# Patient Record
Sex: Female | Born: 1960
Health system: Southern US, Community
[De-identification: ages and names within clinical notes are randomized; demographics above are authoritative.]

## PROBLEM LIST (undated history)

## (undated) DIAGNOSIS — M7500 Adhesive capsulitis of unspecified shoulder: Secondary | ICD-10-CM

## (undated) DIAGNOSIS — J189 Pneumonia, unspecified organism: Secondary | ICD-10-CM

## (undated) DIAGNOSIS — I10 Essential (primary) hypertension: Secondary | ICD-10-CM

## (undated) DIAGNOSIS — E785 Hyperlipidemia, unspecified: Secondary | ICD-10-CM

## (undated) DIAGNOSIS — G629 Polyneuropathy, unspecified: Secondary | ICD-10-CM

## (undated) DIAGNOSIS — E669 Obesity, unspecified: Secondary | ICD-10-CM

## (undated) DIAGNOSIS — I2510 Atherosclerotic heart disease of native coronary artery without angina pectoris: Secondary | ICD-10-CM

## (undated) DIAGNOSIS — K219 Gastro-esophageal reflux disease without esophagitis: Secondary | ICD-10-CM

## (undated) DIAGNOSIS — R002 Palpitations: Secondary | ICD-10-CM

## (undated) DIAGNOSIS — I509 Heart failure, unspecified: Secondary | ICD-10-CM

## (undated) HISTORY — DX: Adhesive capsulitis of unspecified shoulder: M75.00

## (undated) HISTORY — PX: GLAUCOMA SURGERY: SHX656

## (undated) HISTORY — PX: CARDIAC CATHETERIZATION: SHX172

## (undated) HISTORY — DX: Essential (primary) hypertension: I10

## (undated) HISTORY — DX: Obesity, unspecified: E66.9

## (undated) HISTORY — DX: Polyneuropathy, unspecified: G62.9

## (undated) HISTORY — PX: TUBAL LIGATION: SHX77

## (undated) HISTORY — DX: Palpitations: R00.2

## (undated) HISTORY — PX: CATARACT EXTRACTION: SUR2

## (undated) HISTORY — DX: Gastro-esophageal reflux disease without esophagitis: K21.9

## (undated) HISTORY — PX: APPENDECTOMY: SHX54

## (undated) HISTORY — DX: Pneumonia, unspecified organism: J18.9

## (undated) HISTORY — DX: Hyperlipidemia, unspecified: E78.5

---

## 1999-01-20 ENCOUNTER — Encounter: Admission: RE | Admit: 1999-01-20 | Discharge: 1999-01-20 | Payer: Self-pay | Admitting: Internal Medicine

## 1999-08-27 ENCOUNTER — Encounter: Admission: RE | Admit: 1999-08-27 | Discharge: 1999-08-27 | Payer: Self-pay | Admitting: Hematology and Oncology

## 2000-05-03 ENCOUNTER — Encounter: Admission: RE | Admit: 2000-05-03 | Discharge: 2000-05-03 | Payer: Self-pay | Admitting: Internal Medicine

## 2000-12-26 ENCOUNTER — Encounter: Payer: Self-pay | Admitting: Emergency Medicine

## 2000-12-26 ENCOUNTER — Emergency Department (HOSPITAL_COMMUNITY): Admission: EM | Admit: 2000-12-26 | Discharge: 2000-12-26 | Payer: Self-pay

## 2001-01-16 ENCOUNTER — Encounter: Admission: RE | Admit: 2001-01-16 | Discharge: 2001-01-16 | Payer: Self-pay | Admitting: Internal Medicine

## 2002-02-10 ENCOUNTER — Inpatient Hospital Stay (HOSPITAL_COMMUNITY): Admission: EM | Admit: 2002-02-10 | Discharge: 2002-02-12 | Payer: Self-pay | Admitting: *Deleted

## 2002-02-10 ENCOUNTER — Encounter: Payer: Self-pay | Admitting: *Deleted

## 2002-02-11 ENCOUNTER — Encounter: Payer: Self-pay | Admitting: Internal Medicine

## 2002-02-12 ENCOUNTER — Encounter (INDEPENDENT_AMBULATORY_CARE_PROVIDER_SITE_OTHER): Payer: Self-pay | Admitting: Cardiology

## 2002-02-12 ENCOUNTER — Encounter: Payer: Self-pay | Admitting: Internal Medicine

## 2002-06-21 ENCOUNTER — Encounter: Admission: RE | Admit: 2002-06-21 | Discharge: 2002-06-21 | Payer: Self-pay | Admitting: Internal Medicine

## 2002-07-15 ENCOUNTER — Encounter: Admission: RE | Admit: 2002-07-15 | Discharge: 2002-07-15 | Payer: Self-pay | Admitting: Internal Medicine

## 2002-07-15 ENCOUNTER — Emergency Department (HOSPITAL_COMMUNITY): Admission: EM | Admit: 2002-07-15 | Discharge: 2002-07-15 | Payer: Self-pay | Admitting: Emergency Medicine

## 2002-12-09 ENCOUNTER — Encounter: Admission: RE | Admit: 2002-12-09 | Discharge: 2002-12-09 | Payer: Self-pay | Admitting: Internal Medicine

## 2002-12-11 ENCOUNTER — Encounter: Admission: RE | Admit: 2002-12-11 | Discharge: 2002-12-11 | Payer: Self-pay | Admitting: Internal Medicine

## 2002-12-11 ENCOUNTER — Ambulatory Visit (HOSPITAL_COMMUNITY): Admission: RE | Admit: 2002-12-11 | Discharge: 2002-12-11 | Payer: Self-pay | Admitting: Internal Medicine

## 2002-12-11 ENCOUNTER — Encounter: Payer: Self-pay | Admitting: Internal Medicine

## 2002-12-13 ENCOUNTER — Encounter: Admission: RE | Admit: 2002-12-13 | Discharge: 2002-12-13 | Payer: Self-pay | Admitting: Internal Medicine

## 2003-03-21 ENCOUNTER — Encounter: Admission: RE | Admit: 2003-03-21 | Discharge: 2003-03-21 | Payer: Self-pay | Admitting: Internal Medicine

## 2003-05-07 ENCOUNTER — Encounter: Admission: RE | Admit: 2003-05-07 | Discharge: 2003-05-07 | Payer: Self-pay | Admitting: Internal Medicine

## 2003-08-24 ENCOUNTER — Emergency Department (HOSPITAL_COMMUNITY): Admission: AD | Admit: 2003-08-24 | Discharge: 2003-08-24 | Payer: Self-pay | Admitting: Family Medicine

## 2003-11-05 ENCOUNTER — Encounter: Admission: RE | Admit: 2003-11-05 | Discharge: 2003-11-05 | Payer: Self-pay | Admitting: Internal Medicine

## 2003-11-12 ENCOUNTER — Emergency Department (HOSPITAL_COMMUNITY): Admission: EM | Admit: 2003-11-12 | Discharge: 2003-11-13 | Payer: Self-pay | Admitting: Emergency Medicine

## 2004-07-21 ENCOUNTER — Emergency Department (HOSPITAL_COMMUNITY): Admission: EM | Admit: 2004-07-21 | Discharge: 2004-07-21 | Payer: Self-pay | Admitting: Family Medicine

## 2004-09-29 ENCOUNTER — Emergency Department (HOSPITAL_COMMUNITY): Admission: EM | Admit: 2004-09-29 | Discharge: 2004-09-29 | Payer: Self-pay | Admitting: Family Medicine

## 2004-11-18 ENCOUNTER — Emergency Department (HOSPITAL_COMMUNITY): Admission: EM | Admit: 2004-11-18 | Discharge: 2004-11-19 | Payer: Self-pay | Admitting: Emergency Medicine

## 2005-01-26 ENCOUNTER — Emergency Department (HOSPITAL_COMMUNITY): Admission: EM | Admit: 2005-01-26 | Discharge: 2005-01-26 | Payer: Self-pay | Admitting: Family Medicine

## 2005-02-10 ENCOUNTER — Ambulatory Visit: Payer: Self-pay | Admitting: Internal Medicine

## 2005-04-14 ENCOUNTER — Emergency Department (HOSPITAL_COMMUNITY): Admission: EM | Admit: 2005-04-14 | Discharge: 2005-04-14 | Payer: Self-pay | Admitting: Emergency Medicine

## 2005-07-19 ENCOUNTER — Emergency Department (HOSPITAL_COMMUNITY): Admission: EM | Admit: 2005-07-19 | Discharge: 2005-07-19 | Payer: Self-pay | Admitting: Family Medicine

## 2005-10-20 ENCOUNTER — Ambulatory Visit: Payer: Self-pay | Admitting: Internal Medicine

## 2006-01-12 ENCOUNTER — Ambulatory Visit: Payer: Self-pay | Admitting: Internal Medicine

## 2006-01-17 ENCOUNTER — Ambulatory Visit: Payer: Self-pay | Admitting: Internal Medicine

## 2006-01-17 ENCOUNTER — Inpatient Hospital Stay (HOSPITAL_COMMUNITY): Admission: EM | Admit: 2006-01-17 | Discharge: 2006-01-19 | Payer: Self-pay | Admitting: Family Medicine

## 2006-01-18 ENCOUNTER — Encounter (INDEPENDENT_AMBULATORY_CARE_PROVIDER_SITE_OTHER): Payer: Self-pay | Admitting: Internal Medicine

## 2006-01-18 ENCOUNTER — Ambulatory Visit: Payer: Self-pay | Admitting: Dentistry

## 2006-03-07 ENCOUNTER — Ambulatory Visit: Payer: Self-pay | Admitting: Internal Medicine

## 2006-03-17 ENCOUNTER — Ambulatory Visit: Payer: Self-pay | Admitting: Hospitalist

## 2006-04-12 ENCOUNTER — Ambulatory Visit: Payer: Self-pay | Admitting: Internal Medicine

## 2006-04-26 ENCOUNTER — Emergency Department (HOSPITAL_COMMUNITY): Admission: EM | Admit: 2006-04-26 | Discharge: 2006-04-26 | Payer: Self-pay | Admitting: Family Medicine

## 2006-05-08 ENCOUNTER — Ambulatory Visit (HOSPITAL_COMMUNITY): Admission: RE | Admit: 2006-05-08 | Discharge: 2006-05-08 | Payer: Self-pay | Admitting: Ophthalmology

## 2006-06-12 DIAGNOSIS — E1165 Type 2 diabetes mellitus with hyperglycemia: Secondary | ICD-10-CM | POA: Insufficient documentation

## 2006-06-12 DIAGNOSIS — E1140 Type 2 diabetes mellitus with diabetic neuropathy, unspecified: Secondary | ICD-10-CM | POA: Insufficient documentation

## 2006-06-12 DIAGNOSIS — I10 Essential (primary) hypertension: Secondary | ICD-10-CM | POA: Insufficient documentation

## 2006-06-12 DIAGNOSIS — G609 Hereditary and idiopathic neuropathy, unspecified: Secondary | ICD-10-CM | POA: Insufficient documentation

## 2006-06-12 DIAGNOSIS — E66812 Obesity, class 2: Secondary | ICD-10-CM | POA: Insufficient documentation

## 2006-06-12 DIAGNOSIS — E669 Obesity, unspecified: Secondary | ICD-10-CM | POA: Insufficient documentation

## 2006-06-12 DIAGNOSIS — E785 Hyperlipidemia, unspecified: Secondary | ICD-10-CM | POA: Insufficient documentation

## 2006-07-17 ENCOUNTER — Ambulatory Visit (HOSPITAL_COMMUNITY): Admission: RE | Admit: 2006-07-17 | Discharge: 2006-07-17 | Payer: Self-pay | Admitting: Ophthalmology

## 2006-08-03 ENCOUNTER — Ambulatory Visit (HOSPITAL_COMMUNITY): Admission: RE | Admit: 2006-08-03 | Discharge: 2006-08-03 | Payer: Self-pay | Admitting: Internal Medicine

## 2006-08-03 ENCOUNTER — Ambulatory Visit: Payer: Self-pay | Admitting: Vascular Surgery

## 2006-08-03 ENCOUNTER — Ambulatory Visit: Payer: Self-pay | Admitting: Internal Medicine

## 2006-08-16 ENCOUNTER — Ambulatory Visit: Payer: Self-pay | Admitting: Internal Medicine

## 2006-08-16 ENCOUNTER — Encounter (INDEPENDENT_AMBULATORY_CARE_PROVIDER_SITE_OTHER): Payer: Self-pay | Admitting: Unknown Physician Specialty

## 2006-08-16 LAB — CONVERTED CEMR LAB
Glucose, Bld: 242 mg/dL
Hgb A1c MFr Bld: 10.6 %

## 2006-08-29 ENCOUNTER — Telehealth (INDEPENDENT_AMBULATORY_CARE_PROVIDER_SITE_OTHER): Payer: Self-pay | Admitting: *Deleted

## 2006-09-04 ENCOUNTER — Ambulatory Visit: Payer: Self-pay | Admitting: Internal Medicine

## 2006-09-05 ENCOUNTER — Encounter (INDEPENDENT_AMBULATORY_CARE_PROVIDER_SITE_OTHER): Payer: Self-pay | Admitting: Internal Medicine

## 2006-09-11 ENCOUNTER — Telehealth: Payer: Self-pay | Admitting: *Deleted

## 2006-09-18 ENCOUNTER — Inpatient Hospital Stay (HOSPITAL_COMMUNITY): Admission: RE | Admit: 2006-09-18 | Discharge: 2006-09-21 | Payer: Self-pay | Admitting: Internal Medicine

## 2006-09-18 ENCOUNTER — Ambulatory Visit: Payer: Self-pay | Admitting: Internal Medicine

## 2006-09-18 ENCOUNTER — Encounter (INDEPENDENT_AMBULATORY_CARE_PROVIDER_SITE_OTHER): Payer: Self-pay | Admitting: *Deleted

## 2006-09-18 ENCOUNTER — Encounter (INDEPENDENT_AMBULATORY_CARE_PROVIDER_SITE_OTHER): Payer: Self-pay | Admitting: Internal Medicine

## 2006-09-18 LAB — CONVERTED CEMR LAB: Blood Glucose, Fingerstick: 232

## 2006-09-28 ENCOUNTER — Encounter (INDEPENDENT_AMBULATORY_CARE_PROVIDER_SITE_OTHER): Payer: Self-pay | Admitting: Internal Medicine

## 2006-10-06 ENCOUNTER — Ambulatory Visit: Payer: Self-pay | Admitting: Internal Medicine

## 2006-10-06 ENCOUNTER — Encounter (INDEPENDENT_AMBULATORY_CARE_PROVIDER_SITE_OTHER): Payer: Self-pay | Admitting: *Deleted

## 2006-10-06 LAB — CONVERTED CEMR LAB
Alkaline Phosphatase: 50 units/L (ref 39–117)
BUN: 19 mg/dL (ref 6–23)
Blood Glucose, Fingerstick: 280
Creatinine, Ser: 0.74 mg/dL (ref 0.40–1.20)
Glucose, Bld: 243 mg/dL — ABNORMAL HIGH (ref 70–99)
Microalb, Ur: 0.77 mg/dL (ref 0.00–1.89)
TSH: 3.109 microintl units/mL (ref 0.350–5.50)
Total Bilirubin: 0.5 mg/dL (ref 0.3–1.2)

## 2006-10-11 ENCOUNTER — Ambulatory Visit: Payer: Self-pay | Admitting: Hospitalist

## 2006-10-11 LAB — CONVERTED CEMR LAB: Blood Glucose, Fingerstick: 301

## 2006-10-13 ENCOUNTER — Ambulatory Visit: Payer: Self-pay | Admitting: Internal Medicine

## 2006-10-23 ENCOUNTER — Ambulatory Visit (HOSPITAL_COMMUNITY): Admission: RE | Admit: 2006-10-23 | Discharge: 2006-10-23 | Payer: Self-pay | Admitting: Ophthalmology

## 2006-10-23 ENCOUNTER — Encounter (INDEPENDENT_AMBULATORY_CARE_PROVIDER_SITE_OTHER): Payer: Self-pay | Admitting: Internal Medicine

## 2006-10-26 ENCOUNTER — Telehealth (INDEPENDENT_AMBULATORY_CARE_PROVIDER_SITE_OTHER): Payer: Self-pay | Admitting: *Deleted

## 2006-11-01 ENCOUNTER — Telehealth (INDEPENDENT_AMBULATORY_CARE_PROVIDER_SITE_OTHER): Payer: Self-pay | Admitting: Internal Medicine

## 2007-01-11 ENCOUNTER — Telehealth: Payer: Self-pay | Admitting: *Deleted

## 2007-02-05 ENCOUNTER — Ambulatory Visit (HOSPITAL_COMMUNITY): Admission: RE | Admit: 2007-02-05 | Discharge: 2007-02-05 | Payer: Self-pay | Admitting: Ophthalmology

## 2007-02-16 ENCOUNTER — Ambulatory Visit: Payer: Self-pay | Admitting: Infectious Disease

## 2007-02-16 ENCOUNTER — Telehealth: Payer: Self-pay | Admitting: *Deleted

## 2007-02-16 ENCOUNTER — Encounter: Payer: Self-pay | Admitting: Internal Medicine

## 2007-02-16 LAB — CONVERTED CEMR LAB
ALT: 12 units/L (ref 0–35)
Alkaline Phosphatase: 64 units/L (ref 39–117)
Blood Glucose, Fingerstick: 111
Creatinine, Ser: 0.82 mg/dL (ref 0.40–1.20)
Eosinophils Absolute: 0 10*3/uL (ref 0.0–0.7)
HCT: 33.5 % — ABNORMAL LOW (ref 36.0–46.0)
Lymphocytes Relative: 27 % (ref 12–46)
Lymphs Abs: 3.7 10*3/uL — ABNORMAL HIGH (ref 0.7–3.3)
MCV: 86.4 fL (ref 78.0–100.0)
Monocytes Relative: 6 % (ref 3–11)
Neutrophils Relative %: 67 % (ref 43–77)
RBC Folate: 658 ng/mL — ABNORMAL HIGH (ref 180–600)
RBC: 3.88 M/uL (ref 3.87–5.11)
Sodium: 139 meq/L (ref 135–145)
TSH: 0.802 microintl units/mL (ref 0.350–5.50)
Total Bilirubin: 0.3 mg/dL (ref 0.3–1.2)
Total Protein: 7.9 g/dL (ref 6.0–8.3)
WBC: 13.7 10*3/uL — ABNORMAL HIGH (ref 4.0–10.5)

## 2007-02-19 ENCOUNTER — Telehealth (INDEPENDENT_AMBULATORY_CARE_PROVIDER_SITE_OTHER): Payer: Self-pay | Admitting: Internal Medicine

## 2007-02-19 ENCOUNTER — Ambulatory Visit: Payer: Self-pay | Admitting: Internal Medicine

## 2007-02-19 LAB — CONVERTED CEMR LAB

## 2007-02-20 ENCOUNTER — Telehealth (INDEPENDENT_AMBULATORY_CARE_PROVIDER_SITE_OTHER): Payer: Self-pay | Admitting: *Deleted

## 2007-02-20 ENCOUNTER — Encounter (INDEPENDENT_AMBULATORY_CARE_PROVIDER_SITE_OTHER): Payer: Self-pay | Admitting: Internal Medicine

## 2007-03-05 ENCOUNTER — Ambulatory Visit: Payer: Self-pay | Admitting: Internal Medicine

## 2007-03-05 ENCOUNTER — Encounter (INDEPENDENT_AMBULATORY_CARE_PROVIDER_SITE_OTHER): Payer: Self-pay | Admitting: *Deleted

## 2007-03-05 LAB — CONVERTED CEMR LAB
Blood Glucose, Fingerstick: 240
Blood Glucose, Fingerstick: 240

## 2007-03-20 ENCOUNTER — Telehealth: Payer: Self-pay | Admitting: Licensed Clinical Social Worker

## 2007-03-22 ENCOUNTER — Encounter: Payer: Self-pay | Admitting: Licensed Clinical Social Worker

## 2007-04-10 ENCOUNTER — Emergency Department (HOSPITAL_COMMUNITY): Admission: EM | Admit: 2007-04-10 | Discharge: 2007-04-10 | Payer: Self-pay | Admitting: Emergency Medicine

## 2007-04-12 ENCOUNTER — Encounter (INDEPENDENT_AMBULATORY_CARE_PROVIDER_SITE_OTHER): Payer: Self-pay | Admitting: Internal Medicine

## 2007-04-23 ENCOUNTER — Encounter (INDEPENDENT_AMBULATORY_CARE_PROVIDER_SITE_OTHER): Payer: Self-pay | Admitting: Internal Medicine

## 2007-04-24 ENCOUNTER — Encounter (INDEPENDENT_AMBULATORY_CARE_PROVIDER_SITE_OTHER): Payer: Self-pay | Admitting: Internal Medicine

## 2007-05-10 ENCOUNTER — Telehealth (INDEPENDENT_AMBULATORY_CARE_PROVIDER_SITE_OTHER): Payer: Self-pay | Admitting: *Deleted

## 2007-05-23 ENCOUNTER — Encounter (INDEPENDENT_AMBULATORY_CARE_PROVIDER_SITE_OTHER): Payer: Self-pay | Admitting: Internal Medicine

## 2007-09-07 ENCOUNTER — Encounter (INDEPENDENT_AMBULATORY_CARE_PROVIDER_SITE_OTHER): Payer: Self-pay | Admitting: Internal Medicine

## 2007-10-29 ENCOUNTER — Ambulatory Visit (HOSPITAL_COMMUNITY): Admission: RE | Admit: 2007-10-29 | Discharge: 2007-10-29 | Payer: Self-pay | Admitting: Ophthalmology

## 2007-10-30 ENCOUNTER — Encounter (INDEPENDENT_AMBULATORY_CARE_PROVIDER_SITE_OTHER): Payer: Self-pay | Admitting: Internal Medicine

## 2007-11-24 ENCOUNTER — Emergency Department (HOSPITAL_COMMUNITY): Admission: EM | Admit: 2007-11-24 | Discharge: 2007-11-24 | Payer: Self-pay | Admitting: Emergency Medicine

## 2007-12-14 ENCOUNTER — Encounter (INDEPENDENT_AMBULATORY_CARE_PROVIDER_SITE_OTHER): Payer: Self-pay | Admitting: Internal Medicine

## 2007-12-14 ENCOUNTER — Ambulatory Visit: Payer: Self-pay | Admitting: *Deleted

## 2007-12-14 ENCOUNTER — Encounter: Payer: Self-pay | Admitting: Licensed Clinical Social Worker

## 2007-12-14 LAB — CONVERTED CEMR LAB
Blood Glucose, Fingerstick: 224
Hgb A1c MFr Bld: 8 %

## 2007-12-17 LAB — CONVERTED CEMR LAB
AST: 12 units/L (ref 0–37)
Albumin: 4.1 g/dL (ref 3.5–5.2)
Alkaline Phosphatase: 51 units/L (ref 39–117)
BUN: 26 mg/dL — ABNORMAL HIGH (ref 6–23)
Calcium: 9.4 mg/dL (ref 8.4–10.5)
Creatinine, Ser: 1.01 mg/dL (ref 0.40–1.20)
Glucose, Bld: 249 mg/dL — ABNORMAL HIGH (ref 70–99)
HCT: 33.7 % — ABNORMAL LOW (ref 36.0–46.0)
HDL: 52 mg/dL (ref >39)
Hemoglobin: 10.8 g/dL — ABNORMAL LOW (ref 12.0–15.0)
LDL Cholesterol: 117 mg/dL — ABNORMAL HIGH (ref 0–99)
MCHC: 32 g/dL (ref 30.0–36.0)
MCV: 87.3 fL (ref 78.0–100.0)
Potassium: 4.7 meq/L (ref 3.5–5.3)
RBC: 3.86 M/uL — ABNORMAL LOW (ref 3.87–5.11)
RDW: 16.4 % — ABNORMAL HIGH (ref 11.5–15.5)
Total CHOL/HDL Ratio: 4
Triglycerides: 186 mg/dL — ABNORMAL HIGH (ref <150)

## 2007-12-20 ENCOUNTER — Encounter (INDEPENDENT_AMBULATORY_CARE_PROVIDER_SITE_OTHER): Payer: Self-pay | Admitting: Internal Medicine

## 2007-12-20 ENCOUNTER — Ambulatory Visit: Payer: Self-pay | Admitting: Internal Medicine

## 2007-12-20 DIAGNOSIS — D649 Anemia, unspecified: Secondary | ICD-10-CM | POA: Insufficient documentation

## 2007-12-20 DIAGNOSIS — D72829 Elevated white blood cell count, unspecified: Secondary | ICD-10-CM | POA: Insufficient documentation

## 2007-12-20 LAB — CONVERTED CEMR LAB
Hemoglobin: 10.8 g/dL — ABNORMAL LOW (ref 12.0–15.0)
Lymphocytes Relative: 33 % (ref 12–46)
Lymphs Abs: 4.1 10*3/uL — ABNORMAL HIGH (ref 0.7–4.0)
Monocytes Relative: 4 % (ref 3–12)
Neutro Abs: 7.5 10*3/uL (ref 1.7–7.7)
Neutrophils Relative %: 61 % (ref 43–77)
RBC Folate: 514 ng/mL (ref 180–600)
RBC: 3.76 M/uL — ABNORMAL LOW (ref 3.87–5.11)
WBC: 12.3 10*3/uL — ABNORMAL HIGH (ref 4.0–10.5)

## 2007-12-21 LAB — CONVERTED CEMR LAB
Alkaline Phosphatase: 55 units/L (ref 39–117)
CO2: 22 meq/L (ref 19–32)
Creatinine, Ser: 1.03 mg/dL (ref 0.40–1.20)
Glucose, Bld: 251 mg/dL — ABNORMAL HIGH (ref 70–99)
TSH: 1.588 microintl units/mL (ref 0.350–5.50)
Total Bilirubin: 0.2 mg/dL — ABNORMAL LOW (ref 0.3–1.2)
Vitamin B-12: 648 pg/mL (ref 211–911)

## 2008-01-17 ENCOUNTER — Ambulatory Visit: Payer: Self-pay | Admitting: Internal Medicine

## 2008-02-01 ENCOUNTER — Ambulatory Visit: Payer: Self-pay | Admitting: Infectious Diseases

## 2008-02-01 ENCOUNTER — Encounter: Payer: Self-pay | Admitting: Internal Medicine

## 2008-02-01 LAB — CONVERTED CEMR LAB
Blood Glucose, Fingerstick: 335
Eosinophils Absolute: 0.1 10*3/uL (ref 0.0–0.7)
Hgb A1c MFr Bld: 8.2 %
Lymphocytes Relative: 38 % (ref 12–46)
Lymphs Abs: 5.1 10*3/uL — ABNORMAL HIGH (ref 0.7–4.0)
MCV: 88.1 fL (ref 78.0–100.0)
Neutro Abs: 7.5 10*3/uL (ref 1.7–7.7)
Neutrophils Relative %: 55 % (ref 43–77)
Platelets: 286 10*3/uL (ref 150–400)
WBC: 13.5 10*3/uL — ABNORMAL HIGH (ref 4.0–10.5)

## 2008-02-05 ENCOUNTER — Telehealth: Payer: Self-pay | Admitting: *Deleted

## 2008-02-13 ENCOUNTER — Encounter: Payer: Self-pay | Admitting: Internal Medicine

## 2008-02-13 ENCOUNTER — Ambulatory Visit: Payer: Self-pay | Admitting: Internal Medicine

## 2008-02-13 LAB — CONVERTED CEMR LAB
Alkaline Phosphatase: 54 units/L (ref 39–117)
Blood Glucose, Fingerstick: 91
Glucose, Bld: 108 mg/dL — ABNORMAL HIGH (ref 70–99)
Microalb, Ur: 0.61 mg/dL (ref 0.00–1.89)
Sodium: 140 meq/L (ref 135–145)
Total Bilirubin: 0.2 mg/dL — ABNORMAL LOW (ref 0.3–1.2)
Total Protein: 7.1 g/dL (ref 6.0–8.3)

## 2008-02-20 ENCOUNTER — Ambulatory Visit: Payer: Self-pay | Admitting: *Deleted

## 2008-02-20 ENCOUNTER — Telehealth: Payer: Self-pay | Admitting: *Deleted

## 2008-02-20 LAB — CONVERTED CEMR LAB
Bilirubin Urine: NEGATIVE
Specific Gravity, Urine: 1.021 (ref 1.005–1.03)
Urine Glucose: 250 mg/dL — AB

## 2008-02-22 ENCOUNTER — Encounter (INDEPENDENT_AMBULATORY_CARE_PROVIDER_SITE_OTHER): Payer: Self-pay | Admitting: *Deleted

## 2008-02-22 ENCOUNTER — Ambulatory Visit: Payer: Self-pay | Admitting: *Deleted

## 2008-02-22 ENCOUNTER — Encounter: Payer: Self-pay | Admitting: Internal Medicine

## 2008-02-22 DIAGNOSIS — A64 Unspecified sexually transmitted disease: Secondary | ICD-10-CM | POA: Insufficient documentation

## 2008-02-22 LAB — CONVERTED CEMR LAB: Blood Glucose, Fingerstick: 132

## 2008-02-25 ENCOUNTER — Ambulatory Visit: Payer: Self-pay | Admitting: Internal Medicine

## 2008-03-13 ENCOUNTER — Telehealth: Payer: Self-pay | Admitting: *Deleted

## 2008-03-19 ENCOUNTER — Ambulatory Visit: Payer: Self-pay | Admitting: Internal Medicine

## 2008-03-26 ENCOUNTER — Ambulatory Visit (HOSPITAL_COMMUNITY): Admission: RE | Admit: 2008-03-26 | Discharge: 2008-03-26 | Payer: Self-pay | Admitting: Internal Medicine

## 2008-04-18 ENCOUNTER — Telehealth: Payer: Self-pay | Admitting: Internal Medicine

## 2008-04-30 ENCOUNTER — Ambulatory Visit: Payer: Self-pay | Admitting: *Deleted

## 2008-04-30 LAB — CONVERTED CEMR LAB: Blood Glucose, Fingerstick: 121

## 2008-05-07 ENCOUNTER — Ambulatory Visit: Payer: Self-pay | Admitting: Sports Medicine

## 2008-05-07 DIAGNOSIS — M7500 Adhesive capsulitis of unspecified shoulder: Secondary | ICD-10-CM | POA: Insufficient documentation

## 2008-05-08 ENCOUNTER — Encounter: Payer: Self-pay | Admitting: Sports Medicine

## 2008-05-14 ENCOUNTER — Encounter: Payer: Self-pay | Admitting: Internal Medicine

## 2008-06-04 ENCOUNTER — Ambulatory Visit: Payer: Self-pay | Admitting: Internal Medicine

## 2008-06-04 LAB — CONVERTED CEMR LAB: Blood Glucose, Fingerstick: 83

## 2008-06-09 ENCOUNTER — Encounter: Payer: Self-pay | Admitting: Internal Medicine

## 2008-06-09 ENCOUNTER — Ambulatory Visit: Payer: Self-pay | Admitting: Sports Medicine

## 2008-06-09 ENCOUNTER — Telehealth: Payer: Self-pay | Admitting: Internal Medicine

## 2008-07-02 ENCOUNTER — Telehealth (INDEPENDENT_AMBULATORY_CARE_PROVIDER_SITE_OTHER): Payer: Self-pay | Admitting: *Deleted

## 2008-08-05 ENCOUNTER — Ambulatory Visit: Payer: Self-pay | Admitting: Sports Medicine

## 2008-08-06 ENCOUNTER — Encounter: Admission: RE | Admit: 2008-08-06 | Discharge: 2008-08-29 | Payer: Self-pay | Admitting: Sports Medicine

## 2008-08-13 ENCOUNTER — Encounter: Payer: Self-pay | Admitting: Sports Medicine

## 2008-08-18 ENCOUNTER — Emergency Department (HOSPITAL_COMMUNITY): Admission: EM | Admit: 2008-08-18 | Discharge: 2008-08-18 | Payer: Self-pay | Admitting: Emergency Medicine

## 2008-08-25 ENCOUNTER — Telehealth (INDEPENDENT_AMBULATORY_CARE_PROVIDER_SITE_OTHER): Payer: Self-pay | Admitting: *Deleted

## 2008-09-29 ENCOUNTER — Ambulatory Visit: Payer: Self-pay | Admitting: Internal Medicine

## 2008-09-29 ENCOUNTER — Encounter: Payer: Self-pay | Admitting: Internal Medicine

## 2008-09-30 ENCOUNTER — Encounter: Payer: Self-pay | Admitting: Internal Medicine

## 2008-09-30 LAB — CONVERTED CEMR LAB
Albumin: 4.2 g/dL (ref 3.5–5.2)
Alkaline Phosphatase: 52 units/L (ref 39–117)
BUN: 21 mg/dL (ref 6–23)
Cholesterol: 152 mg/dL (ref 0–200)
GFR calc Af Amer: >60 mL/min (ref >60)
GFR calc non Af Amer: 60 mL/min — ABNORMAL LOW (ref >60)
Glucose, Bld: 165 mg/dL — ABNORMAL HIGH (ref 70–99)
Microalb Creat Ratio: 12.3 mg/g (ref 0.0–30.0)
Microalb, Ur: 0.9 mg/dL (ref 0.00–1.89)
Total Bilirubin: 0.2 mg/dL — ABNORMAL LOW (ref 0.3–1.2)
VLDL: 19 mg/dL (ref 0–40)

## 2008-10-07 ENCOUNTER — Encounter: Payer: Self-pay | Admitting: Internal Medicine

## 2008-10-17 ENCOUNTER — Telehealth: Payer: Self-pay | Admitting: Internal Medicine

## 2008-10-20 ENCOUNTER — Telehealth: Payer: Self-pay | Admitting: Internal Medicine

## 2008-10-21 ENCOUNTER — Encounter: Payer: Self-pay | Admitting: Internal Medicine

## 2008-10-21 ENCOUNTER — Telehealth (INDEPENDENT_AMBULATORY_CARE_PROVIDER_SITE_OTHER): Payer: Self-pay | Admitting: *Deleted

## 2008-10-23 ENCOUNTER — Telehealth (INDEPENDENT_AMBULATORY_CARE_PROVIDER_SITE_OTHER): Payer: Self-pay | Admitting: *Deleted

## 2008-10-24 ENCOUNTER — Telehealth: Payer: Self-pay | Admitting: *Deleted

## 2008-12-17 ENCOUNTER — Emergency Department (HOSPITAL_COMMUNITY): Admission: EM | Admit: 2008-12-17 | Discharge: 2008-12-17 | Payer: Self-pay | Admitting: Emergency Medicine

## 2008-12-24 ENCOUNTER — Telehealth: Payer: Self-pay | Admitting: Infectious Diseases

## 2009-01-15 ENCOUNTER — Ambulatory Visit: Payer: Self-pay | Admitting: Infectious Diseases

## 2009-01-15 LAB — CONVERTED CEMR LAB
ALT: 11 units/L (ref 0–35)
AST: 15 units/L (ref 0–37)
Basophils Absolute: 0 10*3/uL (ref 0.0–0.1)
Basophils Relative: 0 % (ref 0–1)
Bilirubin Urine: NEGATIVE
Blood Glucose, Fingerstick: 204
CO2: 24 meq/L (ref 19–32)
Eosinophils Absolute: 0.1 10*3/uL (ref 0.0–0.7)
Eosinophils Relative: 1 % (ref 0–5)
Hemoglobin, Urine: NEGATIVE
Hgb A1c MFr Bld: 8.2 %
Lymphs Abs: 4.1 10*3/uL — ABNORMAL HIGH (ref 0.7–4.0)
MCHC: 32.6 g/dL (ref 30.0–36.0)
MCV: 86.5 fL (ref 78.0–100.0)
Neutrophils Relative %: 59 % (ref 43–77)
Platelets: 282 10*3/uL (ref 150–400)
Protein, ur: NEGATIVE mg/dL
RBC / HPF: NONE SEEN (ref <3)
RDW: 13.9 % (ref 11.5–15.5)
Sodium: 138 meq/L (ref 135–145)
Total Bilirubin: 0.2 mg/dL — ABNORMAL LOW (ref 0.3–1.2)
Total Protein: 7.1 g/dL (ref 6.0–8.3)
Urine Glucose: 100 mg/dL — AB
WBC: 12.1 10*3/uL — ABNORMAL HIGH (ref 4.0–10.5)
pH: 5 (ref 5.0–8.0)

## 2009-01-19 ENCOUNTER — Telehealth: Payer: Self-pay | Admitting: Internal Medicine

## 2009-01-23 ENCOUNTER — Ambulatory Visit: Payer: Self-pay | Admitting: Internal Medicine

## 2009-01-26 ENCOUNTER — Telehealth: Payer: Self-pay | Admitting: Internal Medicine

## 2009-02-11 ENCOUNTER — Telehealth: Payer: Self-pay | Admitting: Internal Medicine

## 2009-02-18 ENCOUNTER — Encounter: Payer: Self-pay | Admitting: Internal Medicine

## 2009-02-18 ENCOUNTER — Ambulatory Visit: Payer: Self-pay | Admitting: Sports Medicine

## 2009-02-23 ENCOUNTER — Telehealth: Payer: Self-pay | Admitting: *Deleted

## 2009-03-03 ENCOUNTER — Encounter: Admission: RE | Admit: 2009-03-03 | Discharge: 2009-06-01 | Payer: Self-pay | Admitting: Sports Medicine

## 2009-03-03 ENCOUNTER — Encounter: Payer: Self-pay | Admitting: Sports Medicine

## 2009-03-03 ENCOUNTER — Encounter: Payer: Self-pay | Admitting: Internal Medicine

## 2009-03-03 ENCOUNTER — Telehealth: Payer: Self-pay | Admitting: Internal Medicine

## 2009-03-12 ENCOUNTER — Ambulatory Visit: Payer: Self-pay | Admitting: Gastroenterology

## 2009-03-17 ENCOUNTER — Encounter: Payer: Self-pay | Admitting: Internal Medicine

## 2009-03-18 ENCOUNTER — Ambulatory Visit: Payer: Self-pay | Admitting: Gastroenterology

## 2009-03-18 ENCOUNTER — Telehealth: Payer: Self-pay | Admitting: Internal Medicine

## 2009-04-16 ENCOUNTER — Telehealth: Payer: Self-pay | Admitting: Internal Medicine

## 2009-04-20 ENCOUNTER — Telehealth: Payer: Self-pay | Admitting: Internal Medicine

## 2009-05-26 ENCOUNTER — Ambulatory Visit: Payer: Self-pay | Admitting: Sports Medicine

## 2009-06-15 ENCOUNTER — Telehealth: Payer: Self-pay | Admitting: Internal Medicine

## 2009-07-01 ENCOUNTER — Encounter: Payer: Self-pay | Admitting: Internal Medicine

## 2009-07-01 ENCOUNTER — Ambulatory Visit: Payer: Self-pay | Admitting: Internal Medicine

## 2009-07-01 LAB — CONVERTED CEMR LAB: Hgb A1c MFr Bld: 8 %

## 2009-07-03 ENCOUNTER — Encounter: Payer: Self-pay | Admitting: Internal Medicine

## 2009-07-13 ENCOUNTER — Telehealth: Payer: Self-pay | Admitting: Internal Medicine

## 2009-07-16 ENCOUNTER — Telehealth: Payer: Self-pay | Admitting: Internal Medicine

## 2009-08-24 ENCOUNTER — Telehealth: Payer: Self-pay | Admitting: Internal Medicine

## 2009-08-30 ENCOUNTER — Emergency Department (HOSPITAL_COMMUNITY)
Admission: EM | Admit: 2009-08-30 | Discharge: 2009-08-30 | Payer: Self-pay | Source: Home / Self Care | Admitting: Emergency Medicine

## 2009-09-21 ENCOUNTER — Ambulatory Visit: Payer: Self-pay | Admitting: Infectious Diseases

## 2009-09-21 LAB — CONVERTED CEMR LAB
ALT: 12 units/L (ref 0–35)
Alkaline Phosphatase: 60 units/L (ref 39–117)
Blood Glucose, Fingerstick: 258
CO2: 23 meq/L (ref 19–32)
Creatinine, Ser: 0.9 mg/dL (ref 0.40–1.20)
Glucose, Bld: 267 mg/dL — ABNORMAL HIGH (ref 70–99)
Sodium: 136 meq/L (ref 135–145)
Total Bilirubin: 0.2 mg/dL — ABNORMAL LOW (ref 0.3–1.2)
Total CK: 70 units/L (ref 7–177)
Total Protein: 7.1 g/dL (ref 6.0–8.3)

## 2009-09-22 ENCOUNTER — Emergency Department (HOSPITAL_COMMUNITY): Admission: EM | Admit: 2009-09-22 | Discharge: 2009-09-22 | Payer: Self-pay | Admitting: Emergency Medicine

## 2009-10-09 ENCOUNTER — Ambulatory Visit: Payer: Self-pay | Admitting: Internal Medicine

## 2009-10-09 ENCOUNTER — Telehealth: Payer: Self-pay | Admitting: Internal Medicine

## 2009-10-09 LAB — CONVERTED CEMR LAB
Cholesterol: 156 mg/dL (ref 0–200)
HDL: 47 mg/dL (ref >39)
Hgb A1c MFr Bld: 9 %
LDL Cholesterol: 79 mg/dL (ref 0–99)
Triglycerides: 150 mg/dL — ABNORMAL HIGH (ref <150)

## 2009-10-12 ENCOUNTER — Ambulatory Visit: Payer: Self-pay | Admitting: Internal Medicine

## 2009-10-12 DIAGNOSIS — K219 Gastro-esophageal reflux disease without esophagitis: Secondary | ICD-10-CM | POA: Insufficient documentation

## 2009-10-15 ENCOUNTER — Encounter: Payer: Self-pay | Admitting: Internal Medicine

## 2009-10-19 ENCOUNTER — Telehealth: Payer: Self-pay | Admitting: Internal Medicine

## 2009-10-28 ENCOUNTER — Telehealth: Payer: Self-pay | Admitting: Internal Medicine

## 2009-10-28 ENCOUNTER — Ambulatory Visit: Payer: Self-pay | Admitting: Internal Medicine

## 2009-10-28 ENCOUNTER — Encounter: Payer: Self-pay | Admitting: Internal Medicine

## 2009-10-28 ENCOUNTER — Telehealth (INDEPENDENT_AMBULATORY_CARE_PROVIDER_SITE_OTHER): Payer: Self-pay | Admitting: *Deleted

## 2009-11-02 ENCOUNTER — Encounter: Payer: Self-pay | Admitting: Internal Medicine

## 2009-11-03 ENCOUNTER — Telehealth (INDEPENDENT_AMBULATORY_CARE_PROVIDER_SITE_OTHER): Payer: Self-pay | Admitting: *Deleted

## 2009-11-12 ENCOUNTER — Telehealth (INDEPENDENT_AMBULATORY_CARE_PROVIDER_SITE_OTHER): Payer: Self-pay | Admitting: *Deleted

## 2009-11-16 ENCOUNTER — Encounter: Payer: Self-pay | Admitting: Internal Medicine

## 2009-11-18 ENCOUNTER — Encounter: Payer: Self-pay | Admitting: Internal Medicine

## 2009-11-19 ENCOUNTER — Ambulatory Visit: Payer: Self-pay | Admitting: Internal Medicine

## 2009-11-19 ENCOUNTER — Telehealth: Payer: Self-pay | Admitting: *Deleted

## 2009-11-19 ENCOUNTER — Telehealth: Payer: Self-pay | Admitting: Internal Medicine

## 2009-12-02 ENCOUNTER — Telehealth (INDEPENDENT_AMBULATORY_CARE_PROVIDER_SITE_OTHER): Payer: Self-pay | Admitting: *Deleted

## 2010-01-25 ENCOUNTER — Encounter: Payer: Self-pay | Admitting: Internal Medicine

## 2010-02-08 ENCOUNTER — Encounter: Payer: Self-pay | Admitting: Internal Medicine

## 2010-03-22 ENCOUNTER — Telehealth: Payer: Self-pay | Admitting: Internal Medicine

## 2010-04-21 ENCOUNTER — Emergency Department (HOSPITAL_COMMUNITY): Admission: EM | Admit: 2010-04-21 | Discharge: 2010-04-21 | Payer: Self-pay | Admitting: Emergency Medicine

## 2010-05-10 ENCOUNTER — Telehealth: Payer: Self-pay | Admitting: Internal Medicine

## 2010-05-14 ENCOUNTER — Telehealth (INDEPENDENT_AMBULATORY_CARE_PROVIDER_SITE_OTHER): Payer: Self-pay | Admitting: *Deleted

## 2010-06-10 ENCOUNTER — Ambulatory Visit: Payer: Self-pay | Admitting: Internal Medicine

## 2010-06-10 LAB — CONVERTED CEMR LAB
Creatinine, Urine: 149 mg/dL
Microalb, Ur: 0.5 mg/dL (ref 0.00–1.89)

## 2010-06-16 ENCOUNTER — Ambulatory Visit: Payer: Self-pay | Admitting: Internal Medicine

## 2010-06-23 ENCOUNTER — Encounter (INDEPENDENT_AMBULATORY_CARE_PROVIDER_SITE_OTHER): Payer: Self-pay | Admitting: *Deleted

## 2010-06-29 ENCOUNTER — Ambulatory Visit (HOSPITAL_COMMUNITY)
Admission: RE | Admit: 2010-06-29 | Discharge: 2010-06-29 | Payer: Self-pay | Source: Home / Self Care | Attending: Internal Medicine | Admitting: Internal Medicine

## 2010-06-30 ENCOUNTER — Ambulatory Visit: Admit: 2010-06-30 | Payer: Self-pay

## 2010-06-30 ENCOUNTER — Encounter: Payer: Self-pay | Admitting: Internal Medicine

## 2010-07-06 ENCOUNTER — Encounter
Admission: RE | Admit: 2010-07-06 | Discharge: 2010-07-06 | Payer: Self-pay | Source: Home / Self Care | Attending: Internal Medicine | Admitting: Internal Medicine

## 2010-07-13 ENCOUNTER — Encounter: Payer: Self-pay | Admitting: Internal Medicine

## 2010-07-14 ENCOUNTER — Ambulatory Visit
Admission: RE | Admit: 2010-07-14 | Discharge: 2010-07-14 | Payer: Self-pay | Source: Home / Self Care | Attending: Sports Medicine | Admitting: Sports Medicine

## 2010-07-14 ENCOUNTER — Encounter: Payer: Self-pay | Admitting: Sports Medicine

## 2010-07-17 ENCOUNTER — Encounter: Payer: Self-pay | Admitting: Family Medicine

## 2010-07-18 ENCOUNTER — Encounter: Payer: Self-pay | Admitting: Dermatology

## 2010-07-18 ENCOUNTER — Encounter: Payer: Self-pay | Admitting: Internal Medicine

## 2010-07-19 ENCOUNTER — Encounter: Payer: Self-pay | Admitting: Gastroenterology

## 2010-07-21 ENCOUNTER — Encounter (INDEPENDENT_AMBULATORY_CARE_PROVIDER_SITE_OTHER): Payer: Self-pay

## 2010-07-21 ENCOUNTER — Ambulatory Visit: Admit: 2010-07-21 | Payer: Self-pay | Admitting: Gastroenterology

## 2010-07-27 NOTE — Assessment & Plan Note (Signed)
Summary: leg pain/gg   Vital Signs:  Patient profile:   50 year old female Height:      68 inches (172.72 cm) Weight:      247.7 pounds (112.59 kg) BMI:     37.80 Temp:     98.6 degrees F oral Pulse rate:   100 / minute BP sitting:   117 / 73  (right arm)  Vitals Entered By: Chinita Pester RN (September 21, 2009 3:42 PM) CC: Aching in legs and shoulders since last Friday. Hit left toe;now throbbing. Is Patient Diabetic? Yes Did you bring your meter with you today? No Pain Assessment Patient in pain? yes     Location: shoulders/leg Intensity: 9 Type: aching Onset of pain  Constant Nutritional Status BMI of > 30 = obese CBG Result 258  Have you ever been in a relationship where you felt threatened, hurt or afraid?No   Does patient need assistance? Functional Status Self care Ambulation Normal   Primary Care Provider:  [name], MD  CC:  Aching in legs and shoulders since last Friday. Hit left toe;now throbbing.Marland Kitchen  History of Present Illness: Ms. Limpert is a 50 yo F with PMH below who presents for L big toe pain. She hit her toe against the side of a table several days ago and says she is still experiencing a "throbbing" pain that has not changed since she initially injured it. She has not tried taking anything for it, such as Tylenol or other OTC med, and she does not believe that it's broken. She also c/o muscle aches in her back and legs since Friday. She reports that she had the flu 3-4 weeks ago and had muscle aches with that but they had resolved before restarting on Friday. She has some lingering dry cough since her illness earlier this month but no other symptoms (e.g. no fevers).   Preventive Screening-Counseling & Management  Alcohol-Tobacco     Alcohol drinks/day: 0     Smoking Status: quit > 6 months     Smoking Cessation Counseling: no     Packs/Day: 6 cigs/day     Year Started: started back since quitting in 2008     Year Quit:  2008  Caffeine-Diet-Exercise     Does Patient Exercise: no     Type of exercise: WALKING     Times/week:   2  Current Medications (verified): 1)  Lantus 100 Unit/ml  Soln (Insulin Glargine) .... Inject 15 Units Once At Night 2)  Metformin Hcl 1000 Mg Tabs (Metformin Hcl) .... Take 1 Tablet By Mouth Two Times A Day 3)  Glipizide 10 Mg Tabs (Glipizide) .... Take 1 Tablet By Mouth Two Times A Day 4)  Lisinopril 20 Mg Tabs (Lisinopril) .... Take 1 Tablet By Mouth Once A Day 5)  Hydrochlorothiazide 25 Mg  Tabs (Hydrochlorothiazide) .... Take 1 Tablet By Mouth Once A Day 6)  Neurontin 600 Mg Tabs (Gabapentin) .... Take 1 Tablet By Mouth Three Times A Day 7)  Pravachol 40 Mg  Tabs (Pravastatin Sodium) .... Take 1 Tablet By Mouth Once A Day 8)  Ferrous Sulfate 324 Mg Tbec (Ferrous Sulfate) .... Take 1 Tablet By Mouth Once Daily 9)  Prodigy Autocode Blood Glucose   Strp (Glucose Blood) .... To Test Blood Sugar 3x Daily 10)  Cvs Omeprazole 20 Mg Tbec (Omeprazole) .... Take 1 Tablet By Mouth Two Times A Day 11)  Prodigy Twist Top Lancets 28g  Misc (Lancets) .... Use To Test Blood Sugar 3x  A Day 12)  Prodigy Autocode Blood Glucose  Devi (Blood Glucose Monitoring Suppl) .... Use To Check Blood Sugar 3x Daily 13)  Prodigy Insulin Syringe 31g X 5/16" 0.3 Ml Misc (Insulin Syringe-Needle U-100) .... Use To Inject Your Lantus Once Daily- 250.00 14)  Tramadol Hcl 50 Mg Tabs (Tramadol Hcl) .Marland Kitchen.. 1 Tab By Mouth Q 6 Hrs As Needed For Severe Pain 15)  Amitriptyline Hcl 25 Mg Tabs (Amitriptyline Hcl) .... One Tab By Mouth Qhs  Allergies (verified): No Known Drug Allergies  Past History:  Past Medical History: Last updated: 03/12/2009 Current Problems:  OBESITY (ICD-278.00) TOBACCO ABUSE (ICD-305.1) PERIPHERAL NEUROPATHY (ICD-356.9) HYPERTENSION (ICD-401.9) HYPERLIPIDEMIA (ICD-272.4) DIABETES MELLITUS, TYPE II (ICD-250.00) Diabetic retinopathy with some vision loss Inadequate material  resources glacoma  Past Surgical History: Last updated: 03/12/2009 Cataract Extraction Glacoma surgery  Family History: Last updated: 03/12/2009 Family History of Diabetes: maternal aunt No FH of Colon Cancer:  Social History: Last updated: 03/12/2009 Occupation: was a Lawyer, currently unemployed, 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 No apparent alopecia or balding. Eyes:  Lazy L eye Lungs:  Normal respiratory effort, chest expands symmetrically. Lungs are clear to auscultation, no crackles or wheezes. Heart:  tachycardic, regular rhythm; no rubs, murmurs, or gallops. Msk:  L big toe tender to palpation most pronounced at base of nail bed, not swollen, warm, or erythematous. Reduced ROM 2/2 pain. Back tenderness at flanks bilaterally. Neurologic:  alert & oriented X3.   Psych:  Cognition and judgment appear intact. Alert and cooperative with normal attention span and concentration. No apparent delusions, illusions, hallucinations   Impression & Recommendations:  Problem # 1:  TOE PAIN (ICD-729.5) Pain is 2/2 stubbed toe, though break/fracture not ruled out. For now, I've encouraged her to take regularly scheduled Tylenol for the pain. If the pain does not improve after 1-2 wks, an x-ray +/- splint may need to be considered.  Problem # 2:  MYALGIA (ICD-729.1) She c/o muscle aches since Friday that are new since her flu-like illness resolved 3-4 weeks ago, and her back is tender to palpation. Given that she is  taking pravastatin, this may very well be the cause of her mylagia. I will check a CK and Cmet today, and I have told her to stop taking the pravastatin for now. She understood.  Her updated medication list for this problem includes:    Tramadol Hcl 50 Mg Tabs (Tramadol hcl) .Marland Kitchen... 1 tab by mouth q 6 hrs as needed for severe pain  Orders: T-CK Total ([id number])  Problem # 3:  DIABETES MELLITUS, TYPE II (ICD-250.00) She will return on April 15 for diabetic labs, including A1c and fasting cholesterol panel (she is not fasting today). No Bmet is necessary at that time as we are getting a Cmet today.   Her updated medication list for this problem includes:    Lantus 100 Unit/ml Soln (Insulin glargine) ..... Inject 15 units once at night    Metformin Hcl 1000 Mg Tabs (Metformin hcl) .Marland Kitchen... Take 1 tablet by mouth two times a day    Glipizide 10 Mg Tabs (  Glipizide) .Marland Kitchen... Take 1 tablet by mouth two times a day    Lisinopril 20 Mg Tabs (Lisinopril) .Marland Kitchen... Take 1 tablet by mouth once a day  Orders: Capillary Blood Glucose/CBG ([id number])  Complete Medication List: 1)  Lantus 100 Unit/ml Soln (Insulin glargine) .... Inject 15 units once at night 2)  Metformin Hcl 1000 Mg Tabs (Metformin hcl) .... Take 1 tablet by mouth two times a day 3)  Glipizide 10 Mg Tabs (Glipizide) .... Take 1 tablet by mouth two times a day 4)  Lisinopril 20 Mg Tabs (Lisinopril) .... Take 1 tablet by mouth once a day 5)  Hydrochlorothiazide 25 Mg Tabs (Hydrochlorothiazide) .... Take 1 tablet by mouth once a day 6)  Neurontin 600 Mg Tabs (Gabapentin) .... Take 1 tablet by mouth three times a day 7)  Pravachol 40 Mg Tabs (Pravastatin sodium) .... Take 1 tablet by mouth once a day 8)  Ferrous Sulfate 324 Mg Tbec (Ferrous sulfate) .... Take 1 tablet by mouth once daily 9)  Prodigy Autocode Blood Glucose Strp (Glucose blood) .... To test blood sugar 3x daily 10)  Cvs Omeprazole 20 Mg Tbec (Omeprazole) .... Take 1 tablet by mouth two  times a day 11)  Prodigy Twist Top Lancets 28g Misc (Lancets) .... Use to test blood sugar 3x a day 12)  Prodigy Autocode Blood Glucose Devi (Blood glucose monitoring suppl) .... Use to check blood sugar 3x daily 13)  Prodigy Insulin Syringe 31g X 5/16" 0.3 Ml Misc (Insulin syringe-needle u-100) .... Use to inject your lantus once daily- 250.00 14)  Tramadol Hcl 50 Mg Tabs (Tramadol hcl) .Marland Kitchen.. 1 tab by mouth q 6 hrs as needed for severe pain 15)  Amitriptyline Hcl 25 Mg Tabs (Amitriptyline hcl) .... One tab by mouth qhs  Other Orders: T-Comprehensive Metabolic Panel ([id number])  Patient Instructions: 1)  Please keep your lab appointment on April 15 and your appointment with Dr. Cena Benton on May 4. 2)  Take Tylenol for your toe pain. If your pain does not improve with the use of regular Tylenol after a week or two, please call the clinic. 3)  Please STOP taking the Pravachol (pravastatin).   Prevention & Chronic Care Immunizations   Influenza vaccine: Fluvax 3+  (03/19/2008)    Tetanus booster: 01/23/2009: Td    Pneumococcal vaccine: Not documented  Colorectal Screening   Hemoccult: Not documented    Colonoscopy: Not documented   Colonoscopy action/deferral: GI referral  (01/23/2009)  Other Screening   Pap smear: NEGATIVE FOR INTRAEPITHELIAL LESIONS OR MALIGNANCY.  (02/22/2008)    Mammogram: Not documented   Mammogram action/deferral: Ordered  (01/23/2009)   Smoking status: quit > 6 months  (09/21/2009)  Diabetes Mellitus   HgbA1C: 8.0  (07/01/2009)   Hemoglobin A1C due: 05/19/2007    Eye exam: Not documented   Eye exam due: 03/13/2007    Foot exam: yes  (07/01/2009)   High risk foot: Yes  (12/14/2007)   Foot care education: Not documented   Foot exam due: 03/14/2008    Urine microalbumin/creatinine ratio: 12.3  (09/29/2008)   Urine microalbumin/cr due: 10/06/2007  Lipids   Total Cholesterol: 152  (09/29/2008)   LDL: 67  (09/29/2008)   LDL Direct: Not documented    HDL: 66  (09/29/2008)   Triglycerides: 95  (09/29/2008)    SGOT (AST): 15  (01/15/2009)   SGPT (ALT): 11  (01/15/2009) CMP ordered    Alkaline phosphatase: 52  (01/15/2009)   Total bilirubin: 0.2  (01/15/2009)  Hypertension  Last Blood Pressure: 117 / 73  (09/21/2009)   Serum creatinine: 0.98  (01/15/2009)   Serum potassium 4.1  (01/15/2009) CMP ordered   Self-Management Support :   Personal Goals (by the next clinic visit) :     Personal A1C goal: 7  (07/01/2009)     Personal blood pressure goal: 130/80  (07/01/2009)     Personal LDL goal: 70  (07/01/2009)    Patient will work on the following items until the next clinic visit to reach self-care goals:     Medications and monitoring: check my blood sugar, check my blood pressure, bring all of my medications to every visit, examine my feet every day  (09/21/2009)     Eating: use fresh or frozen vegetables, eat foods that are low in salt, eat baked foods instead of fried foods, eat fruit for snacks and desserts  (09/21/2009)     Activity: take a 30 minute walk every day  (09/21/2009)    Diabetes self-management support: Education handout, Resources for patients handout, Written self-care plan  (09/21/2009)   Diabetes care plan printed   Diabetes education handout printed   Last medical nutrition therapy: 02/19/2007    Hypertension self-management support: Education handout, Resources for patients handout, Written self-care plan  (09/21/2009)   Hypertension self-care plan printed.   Hypertension education handout printed    Lipid self-management support: Education handout, Resources for patients handout, Written self-care plan  (09/21/2009)   Lipid self-care plan printed.   Lipid education handout printed      Resource handout printed.  Process Orders Check Orders Results:     Error: Cannot connect:  detail: The operation timed out      (stage: SendRequest) Tests Sent for requisitioning (September 21, 2009 4:18 PM):      09/21/2009: [facility] -- T-Comprehensive Metabolic Panel [[id number]] (signed)     09/21/2009: [facility] -- T-CK Total [[id number]] (signed)

## 2010-07-27 NOTE — Letter (Signed)
Summary: DOC DEPOT  DOC DEPOT   Imported By: Margie Billet 11/04/2009 14:28:15  _____________________________________________________________________  External Attachment:    Type:   Image     Comment:   External Document

## 2010-07-27 NOTE — Assessment & Plan Note (Signed)
Summary: ACUTE-TO GO OVER LAB RESULTS/CFB/VEGA   Vital Signs:  Patient profile:   50 year old female Height:      68 inches Weight:      243.1 pounds BMI:     37.10 Temp:     97.1 degrees F oral Pulse rate:   96 / minute BP sitting:   120 / 80  (right arm)  Vitals Entered By: Filomena Jungling NT II (October 12, 2009 1:28 PM) CC: acid reflux Is Patient Diabetic? Yes Did you bring your meter with you today? No  Have you ever been in a relationship where you felt threatened, hurt or afraid?No   Does patient need assistance? Functional Status Self care Ambulation Normal   Primary Care Provider:  [name], MD  CC:  acid reflux.  History of Present Illness: [name] is a 50 yo F with PMH below who presents for f/u on labs.  She states she is doing well but has been experiencing some visual loss in her left eye.  She has an eye appt with Dr. Luciana Axe on monday 10/19/09.  She states her symptoms are very similar to those she experienced when she was dx with glaucoma and cataracts in her R eye approx 1 year ago.  She describes occasional eye pain with periods of black spotty visual loss and blurred vision.  These symptoms have been present for weeks and currently seem stable to her.  She denies h/a, dizziness, syncope, nausea, vomiting, tinnitus, eye drainage, eye redness, fever, chills, muscle weakness, numbness, and other neurological deficits.    She also expresses concern over her increasing HbA1c; it is now 9.  She states she hasn't changed her diet or exercise habit and can't understand why her CBGs are becoming harder to control.  She does not have her meter or CBG log with her today.  She reports fasting am CBGs ranging from 130-250 with occasional highs in the 300-400 occuring during the day.  SHe is currently taking 20units of Lantus at night in addition to Metformin and Glipizide and reports compliance with all of her medication.  SHe also requests authorization for rx  prilosec.  She states her insurance is refusing to pay for it without a doctors note.  She has tried Brewing technologist and feels is doesn't work.  Prevacid and other GERD meds available OTC are too expensive for her.  She has not been taking any medicines for the past week and reports increased symptoms of reflux.  She denies hematemesis, BRBPR, diarrhea, and dark blak stool.  She has no other concerns/complaints today.  Current Problems (verified): 1)  Gerd  (ICD-530.81) 2)  Myalgia  (ICD-729.1) 3)  Toe Pain  (ICD-729.5) 4)  Special Screening For Malignant Neoplasms Colon  (ICD-V76.51) 5)  Nausea With Vomiting  (ICD-787.01) 6)  Rash and Other Nonspecific Skin Eruption  (ICD-782.1) 7)  Adhesive Capsulitis, Left  (ICD-726.0) 8)  Bursitis, Acromioclavicular, Left  (ICD-726.10) 9)  Hypertension  (ICD-401.9) 10)  Hyperlipidemia  (ICD-272.4) 11)  Diabetes Mellitus, Type II  (ICD-250.00) 12)  Preventive Health Care  (ICD-V70.0) 13)  Shoulder Pain, Left  (ICD-719.41) 14)  Obesity  (ICD-278.00) 15)  Sexually Transmitted Disease  (ICD-099.9) 16)  Dysuria  (ICD-788.1) 17)  Acidosis  (ICD-276.2) 18)  Unspecified Anemia  (ICD-285.9) 19)  Leukocytosis Unspecified  (ICD-288.60) 20)  Inadequate Material Resources  (ICD-V60.2) 21)  Dyspnea On Exertion  (ICD-786.09) 22)  Peripheral Neuropathy  (ICD-356.9)  Current Medications (verified): 1)  Lantus 100 Unit/ml  Soln (Insulin Glargine) .... Inject 20 Units Once At Night 2)  Metformin Hcl 1000 Mg Tabs (Metformin Hcl) .... Take 1 Tablet By Mouth Two Times A Day 3)  Glipizide 10 Mg Tabs (Glipizide) .... Take 1 Tablet By Mouth Two Times A Day 4)  Lisinopril 20 Mg Tabs (Lisinopril) .... Take 1 Tablet By Mouth Once A Day 5)  Hydrochlorothiazide 25 Mg  Tabs (Hydrochlorothiazide) .... Take 1 Tablet By Mouth Once A Day 6)  Neurontin 600 Mg Tabs (Gabapentin) .... Take 1 Tablet By Mouth Three Times A Day 7)  Pravachol 40 Mg  Tabs (Pravastatin Sodium) .... Take 1  Tablet By Mouth Once A Day 8)  Ferrous Sulfate 324 Mg Tbec (Ferrous Sulfate) .... Take 1 Tablet By Mouth Once Daily 9)  Prodigy Autocode Blood Glucose   Strp (Glucose Blood) .... To Test Blood Sugar 3x Daily 10)  Cvs Omeprazole 20 Mg Tbec (Omeprazole) .... Take 1 Tablet By Mouth Two Times A Day 11)  Prodigy Twist Top Lancets 28g  Misc (Lancets) .... Use To Test Blood Sugar 3x A Day 12)  Prodigy Autocode Blood Glucose  Devi (Blood Glucose Monitoring Suppl) .... Use To Check Blood Sugar 3x Daily 13)  Prodigy Insulin Syringe 31g X 5/16" 0.3 Ml Misc (Insulin Syringe-Needle U-100) .... Use To Inject Your Lantus Once Daily- 250.00 14)  Tramadol Hcl 50 Mg Tabs (Tramadol Hcl) .Marland Kitchen.. 1 Tab By Mouth Q 6 Hrs As Needed For Severe Pain 15)  Amitriptyline Hcl 25 Mg Tabs (Amitriptyline Hcl) .... One Tab By Mouth Qhs  Allergies (verified): No Known Drug Allergies  Past History:  Past medical, surgical, family and social histories (including risk factors) reviewed, and no changes noted (except as noted below).  Past Medical History: Reviewed history from 03/12/2009 and no changes required. Current Problems:  OBESITY (ICD-278.00) TOBACCO ABUSE (ICD-305.1) PERIPHERAL NEUROPATHY (ICD-356.9) HYPERTENSION (ICD-401.9) HYPERLIPIDEMIA (ICD-272.4) DIABETES MELLITUS, TYPE II (ICD-250.00) Diabetic retinopathy with some vision loss Inadequate material resources glacoma  Past Surgical History: Reviewed history from 03/12/2009 and no changes required. Cataract Extraction Glacoma surgery  Family History: Reviewed history from 03/12/2009 and no changes required. Family History of Diabetes: maternal aunt No FH of Colon Cancer:  Social History: Reviewed history from 03/12/2009 and no changes required. 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  Physical Exam  General:  Well-developed,well-nourished,in no acute distress; alert,appropriate and cooperative throughout examination Head:  normocephalic and atraumatic.   Eyes:  PERRLA.  EOMI. strabismus.  Sclerae and conjunctivae within normal limits.  Mouth:  pharynx pink and moist.   Lungs:  Normal respiratory effort. Lungs are clear to auscultation, no crackles or wheezes. Heart:  normal rate, regular rhythm, no murmur, no gallop, and no rub.   Abdomen:  soft, non-tender, normal bowel sounds, no distention, no masses, and no guarding.   Extremities:  Warm, dry.  No clubbing, cyanosis, or edema. Neurologic:  No gross defecit. Skin:  turgor normal, color normal, and no rashes.   Psych:  Oriented X3, memory intact for recent and remote, normally interactive, good eye contact, not anxious appearing, and not depressed appearing.     Impression & Recommendations:  Problem # 1:  DIABETES MELLITUS, TYPE II (ICD-250.00) Pts HbA1c is slowly trending up.  She  does not have her meter available for review today, so I am unsure where she is having the most trouble.  Advised her to continue with her current regimen for now and to check CBGs regularly over the next 2 weeks to include fasting am CBGS, pre and 2 hr post prandial CBGs for each meal.  Will f/u with her in 2 weeks to review her CBGs and formulate new plan for improved CBG control.  Will refer her to Jamison Neighbor for assitance with DM regimen and help with meal planning, etc.  Ideally, will schedule her with Lupita Leash and [facility] physican on the same day to collaborate on plan.  Her updated medication list for this problem includes:    Lantus 100 Unit/ml Soln (Insulin glargine) ..... Inject 20 units once at night    Metformin Hcl 1000 Mg Tabs (Metformin hcl) .Marland Kitchen... Take 1 tablet by mouth two times a day    Glipizide 10 Mg Tabs (Glipizide) .Marland Kitchen... Take  1 tablet by mouth two times a day    Lisinopril 20 Mg Tabs (Lisinopril) .Marland Kitchen... Take 1 tablet by mouth once a day  Orders: Diabetic Clinic Referral (Diabetic)  Labs Reviewed: Creat: 0.90 (09/21/2009)    Reviewed HgBA1c results: 9.0 (10/09/2009)  8.0 (07/01/2009)  Problem # 2:  GERD (ICD-530.81) I believe she is experiencing a flare of her GERD symptoms.  Will clarify the paperwork needed with Medicare and pharmacy and ensure it is completed promptly.  As she is unable to afford OTC options, will provide her with sufficient samples of prilosec 20mg  tabs to last her for the next 2 weeks until her f/u OPC appt.    Her updated medication list for this problem includes:    Cvs Omeprazole 20 Mg Tbec (Omeprazole) .Marland Kitchen... Take 1 tablet by mouth two times a day  Problem # 3:  HYPERTENSION (ICD-401.9) Stable.  Pts BP is very well controlled; no changes to her regimen at this time. Her updated medication list for this problem includes:    Lisinopril 20 Mg Tabs (Lisinopril) .Marland Kitchen... Take 1 tablet by mouth once a day    Hydrochlorothiazide 25 Mg Tabs (Hydrochlorothiazide) .Marland Kitchen... Take 1 tablet by mouth once a day  Problem # 4:  UNSPECIFIED VISUAL LOSS (ICD-369.9) Pt has an appointment scheduled with Dr. Luciana Axe for next Monday.  He performed surgery for cataract removal in her right eye last year.  I am unsure of the reason behind Ms. Steinhilber' visual loss and feel that further optho eval is ideal.  WIll f/u records from her visit with Dr. Luciana Axe at her appt in 2 weeks.    Problem # 5:  HYPERLIPIDEMIA (ICD-272.4)  Pt is doing well on pravachol with fairly good control of her lipids.  There is some room for improvement.  Encourage her to try and increase exercise with daily walking and to make healthy dietary choices low in fat and sugar.  Will discuss further at her next appt.  Her updated medication list for this problem includes:    Pravachol 40 Mg Tabs (Pravastatin sodium) .Marland Kitchen... Take 1 tablet by mouth once  a day  Labs Reviewed: SGOT: 14 (09/21/2009)   SGPT: 12 (09/21/2009)   HDL:47 (10/09/2009), 66 (09/29/2008)  LDL:79 (10/09/2009), 67 (09/29/2008)  Chol:156 (10/09/2009), 152 (09/29/2008)  Trig:150 (10/09/2009), 95 (09/29/2008)  Complete Medication List: 1)  Lantus 100 Unit/ml Soln (Insulin glargine) .... Inject 20 units once at night 2)  Metformin Hcl 1000 Mg Tabs (Metformin hcl) .... Take 1 tablet by mouth  two times a day 3)  Glipizide 10 Mg Tabs (Glipizide) .... Take 1 tablet by mouth two times a day 4)  Lisinopril 20 Mg Tabs (Lisinopril) .... Take 1 tablet by mouth once a day 5)  Hydrochlorothiazide 25 Mg Tabs (Hydrochlorothiazide) .... Take 1 tablet by mouth once a day 6)  Neurontin 600 Mg Tabs (Gabapentin) .... Take 1 tablet by mouth three times a day 7)  Pravachol 40 Mg Tabs (Pravastatin sodium) .... Take 1 tablet by mouth once a day 8)  Ferrous Sulfate 324 Mg Tbec (Ferrous sulfate) .... Take 1 tablet by mouth once daily 9)  Prodigy Autocode Blood Glucose Strp (Glucose blood) .... To test blood sugar 3x daily 10)  Cvs Omeprazole 20 Mg Tbec (Omeprazole) .... Take 1 tablet by mouth two times a day 11)  Prodigy Twist Top Lancets 28g Misc (Lancets) .... Use to test blood sugar 3x a day 12)  Prodigy Autocode Blood Glucose Devi (Blood glucose monitoring suppl) .... Use to check blood sugar 3x daily 13)  Prodigy Insulin Syringe 31g X 5/16" 0.3 Ml Misc (Insulin syringe-needle u-100) .... Use to inject your lantus once daily- 250.00 14)  Tramadol Hcl 50 Mg Tabs (Tramadol hcl) .Marland Kitchen.. 1 tab by mouth q 6 hrs as needed for severe pain 15)  Amitriptyline Hcl 25 Mg Tabs (Amitriptyline hcl) .... One tab by mouth qhs  Patient Instructions: 1)  Please schedule a follow-up appointment in 2 weeks with Jamison Neighbor and Dr.Romi Rathel.  It would be best to make an appt with Jamison Neighbor before Dr. Arvilla Market. 2)  For the next two weeks, check your blood sugar as follows: 3)  Day 1: Check when you wake up.  Check again  before you eat breakfast, then 2 hours after breakfast. 4)  Day 2: Check before lunch and 2 hours after lunch 5)  Day 3: Check before dinner and 2 hours after dinner.  Check again before bedtime. 6)  Day 4: repeat the cycle again. 7)  Keep checking your sugars anytime you feel they are getting high or low. 8)  Bring your meter and blood sugar log with you to your next appointment. 9)  When you see Dr. Luciana Axe, ask him to send his results and notes to use.  Prescriptions: [facility] OMEPRAZOLE 20 MG TBEC (OMEPRAZOLE) Take 1 tablet by mouth two times a day  #60 x 3   Entered and Authorized by:   Nelda Bucks DO   Signed by:   Nelda Bucks DO on 10/12/2009   Method used:   Samples Given   RxID:   [id number]   Prevention & Chronic Care Immunizations   Influenza vaccine: Fluvax 3+  (03/19/2008)    Tetanus booster: 01/23/2009: Td    Pneumococcal vaccine: Not documented  Colorectal Screening   Hemoccult: Not documented   Hemoccult action/deferral: Deferred  (10/12/2009)    Colonoscopy: Not documented   Colonoscopy action/deferral: GI referral  (01/23/2009)  Other Screening   Pap smear: NEGATIVE FOR INTRAEPITHELIAL LESIONS OR MALIGNANCY.  (02/22/2008)   Pap smear action/deferral: Deferred  (10/12/2009)    Mammogram: Not documented   Mammogram action/deferral: Ordered  (01/23/2009)  Reports requested:   Last mammogram report requested.  Smoking status: quit > 6 months  (09/21/2009)  Diabetes Mellitus   HgbA1C: 9.0  (10/09/2009)   Hemoglobin A1C due: 05/19/2007    Eye exam: Not documented   Eye exam due: 03/13/2007    Foot exam: yes  (07/01/2009)   High risk foot: Yes  (12/14/2007)  Foot care education: Not documented   Foot exam due: 03/14/2008    Urine microalbumin/creatinine ratio: 12.3  (09/29/2008)   Urine microalbumin/cr due: 10/06/2007    Diabetes flowsheet reviewed?: Yes   Progress toward A1C goal: Deteriorated  Lipids   Total Cholesterol: 156   (10/09/2009)   LDL: 79  (10/09/2009)   LDL Direct: Not documented   HDL: 47  (10/09/2009)   Triglycerides: 150  (10/09/2009)    SGOT (AST): 14  (09/21/2009)   SGPT (ALT): 12  (09/21/2009)   Alkaline phosphatase: 60  (09/21/2009)   Total bilirubin: 0.2  (09/21/2009)    Lipid flowsheet reviewed?: Yes   Progress toward LDL goal: Unchanged  Hypertension   Last Blood Pressure: 120 / 80  (10/12/2009)   Serum creatinine: 0.90  (09/21/2009)   Serum potassium 4.8  (09/21/2009)    Hypertension flowsheet reviewed?: Yes   Progress toward BP goal: At goal  Self-Management Support :   Personal Goals (by the next clinic visit) :     Personal A1C goal: 7  (07/01/2009)     Personal blood pressure goal: 130/80  (07/01/2009)     Personal LDL goal: 70  (07/01/2009)    Patient will work on the following items until the next clinic visit to reach self-care goals:     Medications and monitoring: take my medicines every day, check my blood sugar, examine my feet every day  (10/12/2009)     Eating: drink diet soda or water instead of juice or soda, eat more vegetables, use fresh or frozen vegetables, eat foods that are low in salt, eat baked foods instead of fried foods  (10/12/2009)     Activity: take a 30 minute walk every day  (09/21/2009)    Diabetes self-management support: Education handout, Resources for patients handout, Written self-care plan  (09/21/2009)   Referred for diabetes self-mgmt training.   Last medical nutrition therapy: 02/19/2007    Hypertension self-management support: Education handout, Resources for patients handout, Written self-care plan  (09/21/2009)    Lipid self-management support: Education handout, Resources for patients handout, Written self-care plan  (09/21/2009)    Nursing Instructions: Request report of last mammogram

## 2010-07-27 NOTE — Progress Notes (Signed)
Summary: refill/gg  Phone Note Refill Request  on October 19, 2009 4:45 PM  Refills Requested: Medication #1:  HYDROCHLOROTHIAZIDE 25 MG  TABS Take 1 tablet by mouth once a day   Last Refilled: 09/11/2009  Method Requested: Electronic Initial call taken by: Merrie Roof RN,  October 19, 2009 4:45 PM    Prescriptions: HYDROCHLOROTHIAZIDE 25 MG  TABS (HYDROCHLOROTHIAZIDE) Take 1 tablet by mouth once a day  #31 x 3   Entered and Authorized by:   [name] MD   Signed by:   [name] MD on 10/20/2009   Method used:   Electronically to        [facility] (retail)       [address]       [place], [place]  [address]       Ph: [phone]       Fax: [phone]   RxID:   [id number]

## 2010-07-27 NOTE — Miscellaneous (Signed)
[facility]   Imported By: Margie Billet 10/29/2009 11:00:08  _____________________________________________________________________  External Attachment:    Type:   Image     Comment:   External Document

## 2010-07-27 NOTE — Miscellaneous (Signed)
Summary: [facility]:Diabetes Testing Supp[facility]  [facility]   Imported By: Florinda Marker 07/06/2009 15:39:51  _____________________________________________________________________  External Attachment:    Type:   Image     Comment:   External Document

## 2010-07-27 NOTE — Progress Notes (Signed)
Summary: diabetes support/follow-up/dmr  Phone Note Outgoing Call   Call placed by: Jamison Neighbor RD,CDE,  December 02, 2009 2:52 PM Summary of Call: Tried to call patient to follow up on new insulin meal time doses, ? if she is now injecting 4 units at lunch?, how blood sugars are doing with carb counting and to see if she'd be agreeable to schedule a group visit late July.  Follow-up for Phone Call        main contact number was not in order. Unable to leave message on her cell #. Mailed letter requesting she call office.  Follow-up by: Jamison Neighbor RD,CDE,  December 11, 2009 11:20 AM

## 2010-07-27 NOTE — Progress Notes (Signed)
Summary: Labs  Phone Note Call from Patient   Caller: Patient Call For: [name] MD Summary of Call: Pt here for labs.  Labs put in the computer and drawn this am. Initial call taken by: Angelina Ok RN,  October 09, 2009 10:21 AM

## 2010-07-27 NOTE — Progress Notes (Signed)
Summary: request ey exam reports/dmr  Phone Note Outgoing Call   Call placed by: Jamison Neighbor RD,CDE,  Oct 28, 2009 4:20 PM Summary of Call: called Dr. Jillyn Hidden Rankin's office for office notes.  patient says she has retinopathy, cataracts, macular degneration and we do not have this well documented in her chart/problem list. No records from Dr. Luciana Axe, her eye doctor. His office says that unless we or patient request reprots- their policy is to only send informaiton back to the patients regular eye doctor. Reports being sent to Dr. Cena Benton.

## 2010-07-27 NOTE — Progress Notes (Signed)
Summary: diabetes support/dmr  Phone Note Call from Patient Call back at [facility] Phone [phone]   Caller: Patient Details for Reason: insulin is not at [facility] Summary of Call: call: [phone] called patient and informed her that prescriptions were sent last night and should bne there today.  Initial call taken by: Jamison Neighbor RD,CDE,  Nov 03, 2009 9:43 AM     Appended Document: diabetes support/dmr called ot be sure she got prescriptions- she got everything except her meter- will go back today for meter. asked her to have pharmacy call me if problems with insurance coverage.

## 2010-07-27 NOTE — Letter (Signed)
Summary: BLOOD GLUCOSE   BLOOD GLUCOSE   Imported By: Margie Billet 11/24/2009 10:47:14  _____________________________________________________________________  External Attachment:    Type:   Image     Comment:   External Document

## 2010-07-27 NOTE — Miscellaneous (Signed)
Summary: [facility]: Increase Testing Supplies  [facility]: Increase Testing Supplies   Imported By: Florinda Marker 07/02/2009 15:41:15  _____________________________________________________________________  External Attachment:    Type:   Image     Comment:   External Document

## 2010-07-27 NOTE — Progress Notes (Signed)
Summary: new insulin prescription/dmr  Phone Note Outgoing Call   Summary of Call: Disucssed starting meal coverage insulin with Dr. Aldine Contes per patient requst. she needs prescriptions to start new insulin. Initial call taken by: Jamison Neighbor RD,CDE,  Oct 28, 2009 4:08 PM  Follow-up for Phone Call        completed refill, thank you Izyk Marty  Follow-up by: Mliss Sax MD,  Nov 02, 2009 8:32 PM    Prescriptions: PRODIGY INSULIN SYRINGE 31G X 5/16" 0.3 ML MISC (INSULIN SYRINGE-NEEDLE U-100) use to inject your lantus once daily- 250.00  #1 x 1   Entered and Authorized by:   Mliss Sax MD   Signed by:   Mliss Sax MD on 11/02/2009   Method used:   Electronically to        [facility] (retail)       [address]       [place], [place]  [address]       Ph: [phone]       Fax: [phone]   RxID:   [id number] PRODIGY AUTOCODE BLOOD GLUCOSE  DEVI (BLOOD GLUCOSE MONITORING SUPPL) use to check blood sugar 3x daily Brand medically necessary #1 x 0   Entered and Authorized by:   Mliss Sax MD   Signed by:   Mliss Sax MD on 11/02/2009   Method used:   Electronically to        [facility] (retail)       [address]       [place], [place]  [address]       Ph: [phone]       Fax: [phone]   RxID:   [id number] PRODIGY TWIST TOP LANCETS 28G  MISC (LANCETS) use to test blood sugar 3x a day  #100 x 11   Entered and Authorized by:   Mliss Sax MD   Signed by:   Mliss Sax MD on 11/02/2009   Method used:   Electronically to        [facility] (retail)       [address]       [place], [place]  [address]       Ph: [phone]       Fax: [phone]   RxID:   [id number] LANTUS 100 UNIT/ML  SOLN (INSULIN GLARGINE) Inject 20 units once at night  #1 x 3   Entered and Authorized by:   Mliss Sax MD   Signed by:   Mliss Sax MD on 11/02/2009   Method used:    Electronically to        [facility] (retail)       [address]       [place], [place]  [address]       Ph: [phone]       Fax: [phone]   RxID:   [id number] PRODIGY MINI PEN NEEDLES 31G X 5 MM MISC (INSULIN PEN NEEDLE) use to inject insulin 3 times daily  #100 x 11   Entered by:   Jamison Neighbor RD,CDE   Authorized by:   Mliss Sax MD   Signed by:   Mliss Sax MD on 11/02/2009   Method used:   [facility]  [facility]ail[facility]       [address]       [place], [place]  [address]       Ph: [phone]       Fax: [phone]   RxID:   [id number] NOVOLOG FLEXPEN 100 UNIT/ML SOLN (INSULIN ASPART) Inject 3 units 10 minutes before breakfast, 2 units 10 minutes before lunch and 2 units 10 minutes before dinner  #1 box x 2   Entered by:   Jamison Neighbor RD,CDE   Authorized by:   Mliss Sax MD   Signed by:   Mliss Sax MD on 11/02/2009   Method used:   Electronically to        [facility] (retail)       [address]       [place], [place]  [address]       Ph: [phone]       Fax: [phone]   RxID:   [id number]

## 2010-07-27 NOTE — Progress Notes (Signed)
Summary: refill/gg  Phone Note Refill Request  on July 13, 2009 4:45 PM  Refills Requested: Medication #1:  LANTUS 100 UNIT/ML  SOLN Inject 15 units once at night  Medication #2:  PRAVACHOL 40 MG  TABS Take 1 tablet by mouth once a day  Method Requested: Electronic Initial call taken by: Merrie Roof RN,  July 13, 2009 4:46 PM    Prescriptions: PRAVACHOL 40 MG  TABS (PRAVASTATIN SODIUM) Take 1 tablet by mouth once a day  #30 x 6   Entered and Authorized by:   [name] MD   Signed by:   [name] MD on 07/13/2009   Method used:   Electronically to        [facility] (retail)       [address]       [place], [place]  [address]       Ph: [phone]       Fax: [phone]   RxID:   [id number] LANTUS 100 UNIT/ML  SOLN (INSULIN GLARGINE) Inject 15 units once at night  #1 vial x 11   Entered and Authorized by:   [name] MD   Signed by:   [name] MD on 07/13/2009   Method used:   Electronically to        [facility] (retail)       [address]       [place], [place]  [address]       Ph: [phone]       Fax: [phone]   RxID:   [id number]

## 2010-07-27 NOTE — Assessment & Plan Note (Signed)
Summary: diabetes/dmr   Vital Signs:  Patient profile:   50 year old female Weight:      243 pounds BMI:     37.08 MEDICAL NUTRITION THERAPY  Assessment:here for carb counting. Comfortablet with Rapi[Vital Signs-4-CCC]  Allergies: No Known Drug Allergies   Complete Medication List: 1)  Lantus 100 Unit/ml Soln (Insulin glargine) .... Inject 20 units once at night 2)  Metformin Hcl 1000 Mg Tabs (Metformin hcl) .... Take 1 tablet by mouth two times a day 3)  Lisinopril 20 Mg Tabs (Lisinopril) .... Take 1 tablet by mouth once a day 4)  Hydrochlorothiazide 25 Mg Tabs (Hydrochlorothiazide) .... Take 1 tablet by mouth once a day 5)  Neurontin 600 Mg Tabs (Gabapentin) .... Take 1 tablet by mouth three times a day 6)  Pravachol 40 Mg Tabs (Pravastatin sodium) .... Take 1 tablet by mouth once a day 7)  Ferrous Sulfate 324 Mg Tbec (Ferrous sulfate) .... Take 1 tablet by mouth once daily 8)  Prodigy Autocode Blood Glucose Strp (Glucose blood) .... To test blood sugar 3x daily 9)  Cvs Omeprazole 20 Mg Tbec (Omeprazole) .... Take 1 tablet by mouth two times a day 10)  Prodigy Twist Top Lancets 28g Misc (Lancets) .... Use to test blood sugar 3x a day 11)  Prodigy Autocode Blood Glucose Devi (Blood glucose monitoring suppl) .... Use to check blood sugar 3x daily 12)  Prodigy Insulin Syringe 31g X 5/16" 0.3 Ml Misc (Insulin syringe-needle u-100) .... Use to inject your lantus once daily- 250.00 13)  Tramadol Hcl 50 Mg Tabs (Tramadol hcl) .Marland Kitchen.. 1 tab by mouth q 6 hrs as needed for severe pain 14)  Amitriptyline Hcl 25 Mg Tabs (Amitriptyline hcl) .... One tab by mouth qhs 15)  Novolog Flexpen 100 Unit/ml Soln (Insulin aspart) .... Inject 3 units 10 minutes before breakfast, 2 units 10 minutes before lunch and 5 units 10 minutes before dinner 16)  Prodigy Mini Pen Needles 31g X 5 Mm Misc (Insulin pen needle) .... Use to inject insulin 3 times daily  Other Orders: DSMT(Medicare) Individual, 30 Minutes  (G0108)   Vital Signs Todays Weight: 243lb  in BMI 37.08in-lbs      Diabetes Disease Process  Discussed today  Medications State name-action-dose-duration-side effects-and time to take medication: Demonstrates competency   State appropriate timing of food related to medication: Demonstrates competency    Nutritional Management Identify what foods most often affect blood glucose: Demonstrates competency    Verbalize importance of controlling food portions: Demonstrates competency   State importance of spacing and not omitting meals and snacks: Demonstrates competency   State changes planned for home meals/snacks: Needs review/assistance    Monitoring State purpose and frequency of monitoring BG-ketones-HgbA1C  : Needs review/assistance    Complications State the causes- signs and symptoms and prevention of hypoglycemia: Needs review/assistance   Explain proper treatment of hypoglycemia: Demonstrates competency    Exercise    BEHAVIORAL GOALS INITIAL Utilizing medications if for therapeutic effectiveness: Decrease dinner insulin by 1 unit when taking a walk     BEHAVIORAL GOAL FOLLOW UP  Goal attained  Goal attained      MEDICAL NUTRITION THERAPY  Assessment:using rapid acting insulin before meals and happy with improvement in blood sugars. Kept Accucheck 360 view record for 3 days for the past 2 weeks- ran out of glipizide this past week. CBGs less variable off glipizide. She is ready for carb counting today.  Wt:243#- increased  ~ 3 pounds since last month-  need to monitor Medications: metformin, glipizide. lantus and Novolog Blood sugars:see scanned accucheck 360 iew records- range is 70-338 week 1 and 92-234 week 2 24 hour recall:  cereal, portk chop and rice for lunch, pork chop, green beans and creamed potato for dinner- banana for bedtime snack Exercise:trying to wlak more with daughter Labs:A1C 9.0 on 4/15 Estimated needs: 200-225  carb/day  Diagnosis:  NB 1.1-nutrition and food related knowledge defict as related to needing this to assist with blood sugar and weight control and for more accurate insulin dosing as evidenced by her report and variablility in carb intake   Intervention:  1-Coordinate care with physician: adjust Novolog dose at lunnch to 4 units and have her decrease dinner by 1 unit when she walks 2- Educated in carb counting- provided with meal plan and  3- Coordinate care by requesting Glipizide be discontinued 4- Coordinate care by plannig group visit with like patients for addition of support  Monitoring:understanding of what foods contain carbs and how much counts as a serving Evaluation:blood sugars, A1C  Follow-up: 1-2 months- plan to try group with other patients using intensive insulin therpay/carb counting  Recommend againn referral for low vision services. will discuss with doctor  f/u with CDE in 3-4 weeks then, 1-2 months

## 2010-07-27 NOTE — Miscellaneous (Signed)
Summary: [employer]  [employer]   Imported By: Florinda Marker 02/12/2010 16:33:31  _____________________________________________________________________  External Attachment:    Type:   Image     Comment:   External Document

## 2010-07-27 NOTE — Assessment & Plan Note (Signed)
Summary: L leg "gives out", pain also/pcp-vega/hla   Vital Signs:  Patient profile:   50 year old female Height:      68 inches (172.72 cm) Weight:      242.8 pounds (110.36 kg) BMI:     37.05 Temp:     97.1 degrees F (36.17 degrees C) oral Pulse rate:   90 / minute BP sitting:   125 / 78  (right arm)  Vitals Entered By: Stanton Kidney Ditzler RN (July 01, 2009 3:14 PM) Is Patient Diabetic? Yes Did you bring your meter with you today? No Pain Assessment Patient in pain? yes     Location: left leg Intensity: 6 Onset of pain  since 05/2009 Nutritional Status BMI of > 30 = obese Nutritional Status Detail appetite good CBG Result 119  Have you ever been in a relationship where you felt threatened, hurt or afraid?denies   Does patient need assistance? Functional Status Self care Ambulation Normal Comments Ck left leg - pt lowered self down to floor 05/2009 - tripped over footstool.   Primary Care Provider:  [name], MD   History of Present Illness: 50 yr old woman with pmhx as described below comes to the clinic for follow up. Patient reports that an episode of her knee giving out on her 3 weeks ago while she was trying to get up to quick from her bed in the morning. She was able to ease down one knee without falling or hitting her knee. No further episodes since. No pain in knee.   Patient did not bring meter with her today. She "forgot" it.  Patient has an appointment with the Eye Doctor, Podiatrist and Dentist this month. She states that she did not go to the Mammogram or Colonoscopy appointments. Reports that she will set up Mammogram.  Depression History:      The patient denies a depressed mood most of the day and a diminished interest in her usual daily activities.         Preventive Screening-Counseling & Management  Alcohol-Tobacco     Alcohol drinks/day: 0     Smoking Status: quit > 6 months     Smoking Cessation Counseling: no     Packs/Day: 6  cigs/day     Year Started: started back since quitting in 2008     Year Quit: 2008  Caffeine-Diet-Exercise     Does Patient Exercise: no     Type of exercise: WALKING     Times/week:   2  Problems Prior to Update: 1)  Special Screening For Malignant Neoplasms Colon  (ICD-V76.51) 2)  Nausea With Vomiting  (ICD-787.01) 3)  Rash and Other Nonspecific Skin Eruption  (ICD-782.1) 4)  Adhesive Capsulitis, Left  (ICD-726.0) 5)  Bursitis, Acromioclavicular, Left  (ICD-726.10) 6)  Hypertension  (ICD-401.9) 7)  Hyperlipidemia  (ICD-272.4) 8)  Diabetes Mellitus, Type II  (ICD-250.00) 9)  Preventive Health Care  (ICD-V70.0) 10)  Shoulder Pain, Left  (ICD-719.41) 11)  Obesity  (ICD-278.00) 12)  Sexually Transmitted Disease  (ICD-099.9) 13)  Dysuria  (ICD-788.1) 14)  Acidosis  (ICD-276.2) 15)  Unspecified Anemia  (ICD-285.9) 16)  Leukocytosis Unspecified  (ICD-288.60) 17)  Inadequate Material Resources  (ICD-V60.2) 18)  Dyspnea On Exertion  (ICD-786.09) 19)  Peripheral Neuropathy  (ICD-356.9)  Medications Prior to Update: 1)  Lantus 100 Unit/ml  Soln (Insulin Glargine) .... Inject 15 Units Once At Night 2)  Metformin Hcl 1000 Mg Tabs (Metformin Hcl) .... Take 1 Tablet By Mouth Two Times  A Day 3)  Glipizide 10 Mg Tabs (Glipizide) .... Take 1 Tablet By Mouth Two Times A Day 4)  Lisinopril 20 Mg Tabs (Lisinopril) .... Take 1 Tablet By Mouth Once A Day 5)  Hydrochlorothiazide 25 Mg  Tabs (Hydrochlorothiazide) .... Take 1 Tablet By Mouth Once A Day 6)  Neurontin 600 Mg Tabs (Gabapentin) .... Take 1 Tablet By Mouth Three Times A Day 7)  Pravachol 40 Mg  Tabs (Pravastatin Sodium) .... Take 1 Tablet By Mouth Once A Day 8)  Ferrous Sulfate 324 Mg Tbec (Ferrous Sulfate) .... Take 1 Tablet By Mouth Once Daily 9)  Prodigy Autocode Blood Glucose   Strp (Glucose Blood) .... To Test Blood Sugar 3x Daily 10)  Cvs Omeprazole 20 Mg Tbec (Omeprazole) .... Take 1 Tablet By Mouth Two Times A Day 11)  Prodigy  Twist Top Lancets 28g  Misc (Lancets) .... Use To Test Blood Sugar 3x A Day 12)  Prodigy Autocode Blood Glucose  Devi (Blood Glucose Monitoring Suppl) .... Use To Check Blood Sugar 3x Daily 13)  Prodigy Insulin Syringe 31g X 5/16" 0.3 Ml Misc (Insulin Syringe-Needle U-100) .... Use To Inject Your Lantus Once Daily- 250.00 14)  Tramadol Hcl 50 Mg Tabs (Tramadol Hcl) .Marland Kitchen.. 1 Tab By Mouth Q 6 Hrs As Needed For Severe Pain 15)  Amitriptyline Hcl 25 Mg Tabs (Amitriptyline Hcl) .... One Tab By Mouth Qhs  Current Medications (verified): 1)  Lantus 100 Unit/ml  Soln (Insulin Glargine) .... Inject 15 Units Once At Night 2)  Metformin Hcl 1000 Mg Tabs (Metformin Hcl) .... Take 1 Tablet By Mouth Two Times A Day 3)  Glipizide 10 Mg Tabs (Glipizide) .... Take 1 Tablet By Mouth Two Times A Day 4)  Lisinopril 20 Mg Tabs (Lisinopril) .... Take 1 Tablet By Mouth Once A Day 5)  Hydrochlorothiazide 25 Mg  Tabs (Hydrochlorothiazide) .... Take 1 Tablet By Mouth Once A Day 6)  Neurontin 600 Mg Tabs (Gabapentin) .... Take 1 Tablet By Mouth Three Times A Day 7)  Pravachol 40 Mg  Tabs (Pravastatin Sodium) .... Take 1 Tablet By Mouth Once A Day 8)  Ferrous Sulfate 324 Mg Tbec (Ferrous Sulfate) .... Take 1 Tablet By Mouth Once Daily 9)  Prodigy Autocode Blood Glucose   Strp (Glucose Blood) .... To Test Blood Sugar 3x Daily 10)  Cvs Omeprazole 20 Mg Tbec (Omeprazole) .... Take 1 Tablet By Mouth Two Times A Day 11)  Prodigy Twist Top Lancets 28g  Misc (Lancets) .... Use To Test Blood Sugar 3x A Day 12)  Prodigy Autocode Blood Glucose  Devi (Blood Glucose Monitoring Suppl) .... Use To Check Blood Sugar 3x Daily 13)  Prodigy Insulin Syringe 31g X 5/16" 0.3 Ml Misc (Insulin Syringe-Needle U-100) .... Use To Inject Your Lantus Once Daily- 250.00 14)  Tramadol Hcl 50 Mg Tabs (Tramadol Hcl) .Marland Kitchen.. 1 Tab By Mouth Q 6 Hrs As Needed For Severe Pain 15)  Amitriptyline Hcl 25 Mg Tabs (Amitriptyline Hcl) .... One Tab By Mouth  Qhs  Allergies: No Known Drug Allergies  Past History:  Past Medical History: Last updated: 03/12/2009 Current Problems:  OBESITY (ICD-278.00) TOBACCO ABUSE (ICD-305.1) PERIPHERAL NEUROPATHY (ICD-356.9) HYPERTENSION (ICD-401.9) HYPERLIPIDEMIA (ICD-272.4) DIABETES MELLITUS, TYPE II (ICD-250.00) Diabetic retinopathy with some vision loss Inadequate material resources glacoma  Past Surgical History: Last updated: 03/12/2009 Cataract Extraction Glacoma surgery  Family History: Last updated: 03/12/2009 Family History of Diabetes: maternal aunt No FH of Colon Cancer:  Social History: Last updated: 03/12/2009 Occupation: was a  CNA, currently unemployed, 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 Family History of Diabetes: maternal aunt No FH of Colon Cancer:  Social History: Reviewed history from 03/12/2009 and no changes required. 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    Physical Exam  General:  alert and well-developed.  NAD  Eyes:  vision grossly intact.   Ears:  no external deformities.   Nose:  no external deformity.   Mouth:  poor dentition.   Neck:  supple.   Lungs:  Normal respiratory effort, chest expands symmetrically. Lungs are clear to auscultation, no crackles or wheezes. Heart:  Normal rate and regular  rhythm. S1 and S2 normal without gallop, murmur, click, rub or other extra sounds. Abdomen:  soft, non-tender, normal bowel sounds, no distention, no masses, no guarding, no rigidity, and no rebound tenderness.   Msk:  normal ROM, no joint tenderness, no joint swelling, no joint warmth, no redness over joints, no joint deformities, no joint instability, and no muscle atrophy.   Pulses:  2+ radial pulses Extremities:  no edema Neurologic:  alert & oriented X3.    Diabetes Management Exam:    Foot Exam (with socks and/or shoes not present):       Sensory-Monofilament:          Left foot: normal          Right foot: normal   Impression & Recommendations:  Problem # 1:  DIABETES MELLITUS, TYPE II (ICD-250.00) Slightly improved HgA1c. Will continue current regimen and follow up. Patient was instructed to check blood sugars at least twice a day and to bring meter on follow up. Will follow up Podiatrist and Opthalmologist visits.  Her updated medication list for this problem includes:    Lantus 100 Unit/ml Soln (Insulin glargine) ..... Inject 15 units once at night    Metformin Hcl 1000 Mg Tabs (Metformin hcl) .Marland Kitchen... Take 1 tablet by mouth two times a day    Glipizide 10 Mg Tabs (Glipizide) .Marland Kitchen... Take 1 tablet by mouth two times a day    Lisinopril 20 Mg Tabs (Lisinopril) .Marland Kitchen... Take 1 tablet by mouth once a day  Orders: T- Capillary Blood Glucose (16109) T-Hgb A1C (in-house) (60454UJ)  Labs Reviewed: Creat: 0.98 (01/15/2009)    Reviewed HgBA1c results: 8.0 (07/01/2009)  8.2 (01/15/2009)  Problem # 2:  HYPERTENSION (ICD-401.9) Controlled. Continue current regimen.  Her updated medication list for this problem includes:    Lisinopril 20 Mg Tabs (Lisinopril) .Marland Kitchen... Take 1 tablet by mouth once a day    Hydrochlorothiazide 25 Mg Tabs (Hydrochlorothiazide) .Marland Kitchen... Take 1 tablet by mouth once a day  BP today: 125/78 Prior BP: 100/67 (05/26/2009)  Labs Reviewed: K+: 4.1 (01/15/2009) Creat:  : 0.98 (01/15/2009)   Chol: 152 (09/29/2008)   HDL: 66 (09/29/2008)   LDL: 67 (09/29/2008)   TG: 95 (09/29/2008)  Problem # 3:  HYPERLIPIDEMIA (ICD-272.4) At goal. Recheck FLP on follow up.  Her updated  medication list for this problem includes:    Pravachol 40 Mg Tabs (Pravastatin sodium) .Marland Kitchen... Take 1 tablet by mouth once a day  Labs Reviewed: SGOT: 15 (01/15/2009)   SGPT: 11 (01/15/2009)   HDL:66 (09/29/2008), 52 (12/14/2007)  LDL:67 (09/29/2008), 117 (30/16/0109)  Chol:152 (09/29/2008), 206 (12/14/2007)  Trig:95 (09/29/2008), 186 (12/14/2007)  Problem # 4:  PREVENTIVE HEALTH CARE (ICD-V70.0) Patient was instructed to schedule Mammogram and Colonoscopy as they have both been ordered. Patient understands the importance of cancer screening and is in agreement to having procedures done.  Complete Medication List: 1)  Lantus 100 Unit/ml Soln (Insulin glargine) .... Inject 15 units once at night 2)  Metformin Hcl 1000 Mg Tabs (Metformin hcl) .... Take 1 tablet by mouth two times a day 3)  Glipizide 10 Mg Tabs (Glipizide) .... Take 1 tablet by mouth two times a day 4)  Lisinopril 20 Mg Tabs (Lisinopril) .... Take 1 tablet by mouth once a day 5)  Hydrochlorothiazide 25 Mg Tabs (Hydrochlorothiazide) .... Take 1 tablet by mouth once a day 6)  Neurontin 600 Mg Tabs (Gabapentin) .... Take 1 tablet by mouth three times a day 7)  Pravachol 40 Mg Tabs (Pravastatin sodium) .... Take 1 tablet by mouth once a day 8)  Ferrous Sulfate 324 Mg Tbec (Ferrous sulfate) .... Take 1 tablet by mouth once daily 9)  Prodigy Autocode Blood Glucose Strp (Glucose blood) .... To test blood sugar 3x daily 10)  Cvs Omeprazole 20 Mg Tbec (Omeprazole) .... Take 1 tablet by mouth two times a day 11)  Prodigy Twist Top Lancets 28g Misc (Lancets) .... Use to test blood sugar 3x a day 12)  Prodigy Autocode Blood Glucose Devi (Blood glucose monitoring suppl) .... Use to check blood sugar 3x daily 13)  Prodigy Insulin Syringe  31g X 5/16" 0.3 Ml Misc (Insulin syringe-needle u-100) .... Use to inject your lantus once daily- 250.00 14)  Tramadol Hcl 50 Mg Tabs (Tramadol hcl) .Marland Kitchen.. 1 tab by mouth q 6 hrs as needed for severe pain 15)  Amitriptyline Hcl 25 Mg Tabs (Amitriptyline hcl) .... One tab by mouth qhs  Patient Instructions: 1)  Please schedule a follow-up appointment in 3 months. 2)  Bring meter during next appointment. 3)  Check sugars twice a day. 4)  Take all medication as directed. 5)  Make appointment for Mammogram and Colonoscopy. 6)  Go to appointments with Podiatrist, Dentist, and Opthalmologist. Prescriptions: LANTUS 100 UNIT/ML  SOLN (INSULIN GLARGINE) Inject 15 units once at night  #1 month x 11   Entered and Authorized by:   [name] MD   Signed by:   [name] MD on 07/01/2009   Method used:   Electronically to        [facility] (retail)       [address]       [place], [place]  [address]       Ph: [phone]       Fax: [phone]   RxID:   [id number]    Prevention & Chronic Care Immunizations   Influenza vaccine: Fluvax 3+  (03/19/2008)    Tetanus booster: 01/23/2009: Td    Pneumococcal vaccine: Not documented  Colorectal Screening   Hemoccult: Not documented    Colonoscopy: Not documented   Colonoscopy action/deferral: GI referral  (01/23/2009)  Other Screening   Pap smear: NEGATIVE FOR INTRAEPITHELIAL LESIONS OR MALIGNANCY.  (02/22/2008)    Mammogram: Not documented  Mammogram action/deferral: Ordered  (01/23/2009)   Smoking status: quit > 6 months  (07/01/2009)  Diabetes Mellitus   HgbA1C: 8.0  (07/01/2009)   Hemoglobin A1C due: 05/19/2007    Eye exam: Not documented   Eye exam due: 03/13/2007    Foot exam: yes  (07/01/2009)   High risk foot: Yes  (12/14/2007)   Foot care education: Not documented   Foot exam due: 03/14/2008    Urine microalbumin/creatinine ratio: 12.3  (09/29/2008)   Urine  microalbumin/cr due: 10/06/2007    Diabetes flowsheet reviewed?: Yes   Progress toward A1C goal: Improved  Lipids   Total Cholesterol: 152  (09/29/2008)   LDL: 67  (09/29/2008)   LDL Direct: Not documented   HDL: 66  (09/29/2008)   Triglycerides: 95  (09/29/2008)    SGOT (AST): 15  (01/15/2009)   SGPT (ALT): 11  (01/15/2009)   Alkaline phosphatase: 52  (01/15/2009)   Total bilirubin: 0.2  (01/15/2009)    Lipid flowsheet reviewed?: Yes   Progress toward LDL goal: At goal  Hypertension   Last Blood Pressure: 125 / 78  (07/01/2009)   Serum creatinine: 0.98  (01/15/2009)   Serum potassium 4.1  (01/15/2009)    Hypertension flowsheet reviewed?: Yes   Progress toward BP goal: At goal  Self-Management Support :   Personal Goals (by the next clinic visit) :     Personal A1C goal: 7  (07/01/2009)     Personal blood pressure goal: 130/80  (07/01/2009)     Personal LDL goal: 70  (07/01/2009)    Patient will work on the following items until the next clinic visit to reach self-care goals:     Medications and monitoring: take my medicines every day, check my blood sugar, check my blood pressure, examine my feet every day  (07/01/2009)     Eating: drink diet soda or water instead of juice or soda, eat more vegetables, use fresh or frozen vegetables, eat foods that are low in salt, eat baked foods instead of fried foods, eat fruit for snacks and desserts, limit or avoid alcohol  (07/01/2009)     Activity: take a 30 minute walk every day  (07/01/2009)    Diabetes self-management support: Written self-care plan  (07/01/2009)   Diabetes care plan printed   Last medical nutrition therapy: 02/19/2007    Hypertension self-management support: Written self-care plan  (07/01/2009)   Hypertension self-care plan printed.    Lipid self-management support: Written self-care plan  (07/01/2009)   Lipid self-care plan printed.   Last LDL:                                                 67  (09/29/2008 9:57:00 PM)        Diabetic Foot Exam Foot Inspection Is there a history of a foot ulcer?              No Is there a foot ulcer now?              No Can the patient see the bottom of their feet?          Yes Are the shoes appropriate in style and fit?          Yes Is there swelling or an abnormal foot shape?          No Are the  toenails long?                Yes Are the toenails thick?                Yes Are the toenails ingrown?              No Is there heavy callous build-up?              No Is there a claw toe deformity?                          No Is there elevated skin temperature?            No Is there limited ankle dorsiflexion?            No Is there foot or ankle muscle weakness?            No Do you have pain in calf while walking?           No         10-g (5.07) Semmes-Weinstein Monofilament Test Performed by: Stanton Kidney Ditzler RN          Right Foot          Left Foot Visual Inspection     normal         normal Test Control      normal         normal Site 1         normal         normal Site 2         normal         normal Site 3         normal         normal Site 4         normal         normal Site 5         normal         normal Site 6         normal         normal Site 7         normal         normal Site 8         normal         normal Site 9         normal         normal Site 10         normal         normal  Impression      normal         normal   Laboratory Results   Blood Tests   Date/Time Received: July 01, 2009 3:43 PM Date/Time Reported: Alric Quan  July 01, 2009 3:43 PM   HGBA1C: 8.0%   (Normal Range: Non-Diabetic - 3-6%   Control Diabetic - 6-8%) CBG Random:: 119mg /dL

## 2010-07-27 NOTE — Assessment & Plan Note (Signed)
Summary: EST-CK/FU/MEDS/CFB   Vital Signs:  Patient profile:   50 year old female Height:      68 inches Weight:      239.6 pounds BMI:     36.56 Temp:     96.6 degrees F oral Pulse rate:   80 / minute BP sitting:   99 / 69  (right arm)  Vitals Entered By: Filomena Jungling NT II (Oct 28, 2009 1:37 PM) CC: need reill on med. Is Patient Diabetic? Yes Did you bring your meter with you today? Yes Pain Assessment Patient in pain? no      Nutritional Status BMI of > 30 = obese  Have you ever been in a relationship where you felt threatened, hurt or afraid?No   Does patient need assistance? Functional Status Self care Ambulation Normal   Primary Care Provider:  [name], MD  CC:  need reill on med.Marland Kitchen  History of Present Illness: 50 yo female with PMH outlined below presents to [facility] for regular follow up appointment. She has no concerns at the time. She is here for follow up on her sugar levels. Her doctor asked her to come back so that we can check sugar level control. No recent sicknesses or hospitalizaitons. No episodes of chest pain, SOB, palpitations. No specific abdominal or urinary concerns. No recent changes in appetite, weight, sleep patterns, mood.    Preventive Screening-Counseling & Management  Alcohol-Tobacco     Alcohol drinks/day: 0     Smoking Status: quit > 6 months     Smoking Cessation Counseling: no     Packs/Day: 6 cigs/day     Year Started: started back since quitting in 2008     Year Quit: 2008  Caffeine-Diet-Exercise     Does Patient Exercise: no     Type of exercise: WALKING     Times/week:   2  Problems Prior to Update: 1)  Unspecified Visual Loss  (ICD-369.9) 2)  Gerd  (ICD-530.81) 3)  Myalgia  (ICD-729.1) 4)  Toe Pain  (ICD-729.5) 5)  Special Screening For Malignant Neoplasms Colon  (ICD-V76.51) 6)  Nausea With Vomiting  (ICD-787.01) 7)  Rash and Other Nonspecific Skin Eruption  (ICD-782.1) 8)  Adhesive Capsulitis, Left   (ICD-726.0) 9)  Bursitis, Acromioclavicular, Left  (ICD-726.10) 10)  Hypertension  (ICD-401.9) 11)  Hyperlipidemia  (ICD-272.4) 12)  Diabetes Mellitus, Type II  (ICD-250.00) 13)  Preventive Health Care  (ICD-V70.0) 14)  Shoulder Pain, Left  (ICD-719.41) 15)  Obesity  (ICD-278.00) 16)  Sexually Transmitted Disease  (ICD-099.9) 17)  Dysuria  (ICD-788.1) 18)  Acidosis  (ICD-276.2) 19)  Unspecified Anemia  (ICD-285.9) 20)  Leukocytosis Unspecified  (ICD-288.60) 21)  Inadequate Material Resources  (ICD-V60.2) 22)  Dyspnea On Exertion  (ICD-786.09) 23)  Peripheral Neuropathy  (ICD-356.9)  Current Medications (verified): 1)  Lantus 100 Unit/ml  Soln (Insulin Glargine) .... Inject 20 Units Once At Night 2)  Metformin Hcl 1000 Mg Tabs (Metformin Hcl) .... Take 1 Tablet By Mouth Two Times A Day 3)  Glipizide 10 Mg Tabs (Glipizide) .... Take 1 Tablet By Mouth Two Times A Day 4)  Lisinopril 20 Mg Tabs (Lisinopril) .... Take 1 Tablet By Mouth Once A Day 5)  Hydrochlorothiazide 25 Mg  Tabs (Hydrochlorothiazide) .... Take 1 Tablet By Mouth Once A Day 6)  Neurontin 600 Mg Tabs (Gabapentin) .... Take 1 Tablet By Mouth Three Times A Day 7)  Pravachol 40 Mg  Tabs (Pravastatin Sodium) .... Take 1 Tablet By Mouth  Once A Day 8)  Ferrous Sulfate 324 Mg Tbec (Ferrous Sulfate) .... Take 1 Tablet By Mouth Once Daily 9)  Prodigy Autocode Blood Glucose   Strp (Glucose Blood) .... To Test Blood Sugar 3x Daily 10)  Cvs Omeprazole 20 Mg Tbec (Omeprazole) .... Take 1 Tablet By Mouth Two Times A Day 11)  Prodigy Twist Top Lancets 28g  Misc (Lancets) .... Use To Test Blood Sugar 3x A Day 12)  Prodigy Autocode Blood Glucose  Devi (Blood Glucose Monitoring Suppl) .... Use To Check Blood Sugar 3x Daily 13)  Prodigy Insulin Syringe 31g X 5/16" 0.3 Ml Misc (Insulin Syringe-Needle U-100) .... Use To Inject Your Lantus Once Daily- 250.00 14)  Tramadol Hcl 50 Mg Tabs (Tramadol Hcl) .Marland Kitchen.. 1 Tab By Mouth Q 6 Hrs As Needed For  Severe Pain 15)  Amitriptyline Hcl 25 Mg Tabs (Amitriptyline Hcl) .... One Tab By Mouth Qhs  Allergies (verified): No Known Drug Allergies  Past History:  Past Medical History: Last updated: 03/12/2009 Current Problems:  OBESITY (ICD-278.00) TOBACCO ABUSE (ICD-305.1) PERIPHERAL NEUROPATHY (ICD-356.9) HYPERTENSION (ICD-401.9) HYPERLIPIDEMIA (ICD-272.4) DIABETES MELLITUS, TYPE II (ICD-250.00) Diabetic retinopathy with some vision loss Inadequate material resources glacoma  Past Surgical History: Last updated: 03/12/2009 Cataract Extraction Glacoma surgery  Family History: Last updated: 03/12/2009 Family History of Diabetes: maternal aunt No FH of Colon Cancer:  Social History: Last updated: 03/12/2009 Occupation: was a Lawyer, currently unemployed, 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 Family History of Diabetes: maternal aunt No FH of Colon Cancer:  Social History: Reviewed history from 03/12/2009 and no changes required. 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 Lungs are clear to auscultation, no crackles or wheezes. Heart:  Normal rate and regular rhythm. S1 and S2 normal without gallop, murmur, click, rub or other extra sounds. Abdomen:  soft, non-tender, normal bowel sounds, no distention, no masses, and no  guarding.   Neurologic:  No gross defecit. Psych:  Oriented X3, memory intact for recent and remote, normally interactive, good eye contact, not anxious appearing, and not depressed appearing.    Diabetes Management Exam:    Foot Exam (with socks and/or shoes not present):       Sensory-Monofilament:          Left foot: normal          Right foot: normal   Impression & Recommendations:  Problem # 1:  HYPERTENSION (ICD-401.9) Somewhat low today compared to her baseline. I have asked her to keep checking her BP regularly and to call us back if her BP is <90. For now will cont the same regimen.  Her updated medication list for this problem includes:    Lisinopril 20 Mg Tabs (Lisinopril) .Marland Kitchen... Take 1 tablet by mouth once a day    Hydrochlorothiazide 25 Mg Tabs (Hydrochlorothiazide) .Marland Kitchen... Take 1 tablet by mouth once a day  BP today: 99/69 Prior BP: 120/80 (10/12/2009)  Labs Reviewed:  K+: 4.8 (09/21/2009) Creat: : 0.90 (09/21/2009)   Chol: 156 (10/09/2009)   HDL: 47 (10/09/2009)   LDL: 79 (10/09/2009)   TG: 150 (10/09/2009)  Problem # 2:  HYPERLIPIDEMIA (ICD-272.4) Good control, LDL slightly higher than previous reading. Will cont the same regimen. Diet and exercise discussed with patient.  Her updated medication list for this problem includes:    Pravachol 40 Mg Tabs (Pravastatin sodium) .Marland Kitchen... Take 1 tablet by mouth once a day  Labs Reviewed: SGOT: 14 (09/21/2009)   SGPT: 12 (09/21/2009)   HDL:47 (10/09/2009), 66 (09/29/2008)  LDL:79 (10/09/2009), 67 (09/29/2008)  Chol:156 (10/09/2009), 152 (09/29/2008)  Trig:150 (10/09/2009), 95 (09/29/2008)  Problem # 3:  DIABETES MELLITUS, TYPE II (ICD-250.00) She brought the meter with her and we could not download it. I have reviewed  it and the readings are between 200 - 400's with only one low of 77. She tells me that she is compliant with recommended diabetic regimen but would like short acting agent for her sugar highs. I think it woud be  appropriate to increase the dose of lantus to 25 units or to add short acting insulin for sugar highs. She up to date on eye appointment. She will see Jamison Neighbor today and will aske her to assist in medication dosing.  Her updated medication list for this problem includes:    Lantus 100 Unit/ml Soln (Insulin glargine) ..... Inject 20 units once at night    Metformin Hcl 1000 Mg Tabs (Metformin hcl) .Marland Kitchen... Take 1 tablet by mouth two times a day    Glipizide 10 Mg Tabs (Glipizide) .Marland Kitchen... Take 1 tablet by mouth two times a day  Labs Reviewed: Creat: 0.90 (09/21/2009)    Reviewed HgBA1c results: 9.0 (10/09/2009)  8.0 (07/01/2009)  Complete Medication List: 1)  Lantus 100 Unit/ml Soln (Insulin glargine) .... Inject 20 units once at night 2)  Metformin Hcl 1000 Mg Tabs (Metformin hcl) .... Take 1 tablet by mouth two times a day 3)  Glipizide 10 Mg Tabs (Glipizide) .... Take 1 tablet by mouth two times a day 4)  Lisinopril 20 Mg Tabs (Lisinopril) .... Take 1 tablet by mouth once a day 5)  Hydrochlorothiazide 25 Mg Tabs (Hydrochlorothiazide) .... Take 1 tablet by mouth once a day 6)  Neurontin 600 Mg Tabs (Gabapentin) .... Take 1 tablet by mouth three times a day 7)  Pravachol 40 Mg Tabs (Pravastatin sodium) .... Take 1 tablet by mouth once a day 8)  Ferrous Sulfate 324 Mg Tbec (Ferrous sulfate) .... Take 1 tablet by mouth once daily 9)  Prodigy Autocode Blood Glucose Strp (Glucose blood) .... To test blood sugar 3x daily 10)  Cvs Omeprazole 20 Mg Tbec (Omeprazole) .... Take 1 tablet by mouth two times a day 11)  Prodigy Twist Top Lancets 28g Misc (Lancets) .... Use to test blood sugar 3x a day 12)  Prodigy Autocode Blood Glucose Devi (Blood glucose monitoring suppl) .... Use to check blood sugar 3x daily 13)  Prodigy Insulin Syringe 31g X 5/16" 0.3 Ml Misc (Insulin syringe-needle u-100) .... Use to inject your lantus once daily- 250.00 14)  Tramadol Hcl 50 Mg Tabs (Tramadol hcl) .Marland Kitchen.. 1 tab by mouth  q 6 hrs as needed for severe pain 15)  Amitriptyline Hcl 25 Mg Tabs (Amitriptyline hcl) .... One tab by mouth qhs  Patient Instructions: 1)  Please schedule a follow-up appointment in 3 months. 2)  Please check your sugar levels regularly and remember to bring the meter with you  to the next clinic appointment, if the sugars are > 350 or < 60 please call us at [phone] 3)  Please check your blood pressure regularly, if it is >170 please call clinic at [phone] Prescriptions: AMITRIPTYLINE HCL 25 MG TABS (AMITRIPTYLINE HCL) One tab by mouth QHS  #30 x 6   Entered by:   Mliss Sax MD   Authorized by:   [name] MD   Signed by:   Mliss Sax MD on 10/28/2009   Method used:   Electronically to        [facility] (retail)       [address]       [place], [place]  [address]       Ph: [phone]       Fax: [phone]   RxID:   [id number] TRAMADOL HCL 50 MG TABS (TRAMADOL HCL) 1 tab by mouth q 6 hrs as needed for severe pain  #60 x 6   Entered by:   Mliss Sax MD   Authorized by:   [name] MD   Signed by:   Mliss Sax MD on 10/28/2009   Method used:   Electronically to        [facility] (retail)       [address]       [place], [place]  [address]       Ph: [phone]       Fax: [phone]   RxID:   [id number] PRAVACHOL 40 MG  TABS (PRAVASTATIN SODIUM) Take 1 tablet by mouth once a day  #30 x 6   Entered by:   Mliss Sax MD   Authorized by:   [name] MD   Signed by:   Mliss Sax MD on 10/28/2009   Method used:   Electronically to        [facility] (retail)       [address]       [place], [place]  [address]       Ph: [phone]       Fax: [phone]   RxID:   [id number] NEURONTIN 600 MG TABS (GABAPENTIN) Take 1 tablet by mouth three times a day  #90 x 6   Entered by:   Mliss Sax MD   Authorized by:    [name] MD   Signed by:   Mliss Sax MD on 10/28/2009   Method used:   Electronically to        [facility] (retail)       [address]       [place], [place]  [address]       Ph: [phone]       Fax: [phone]   RxID:   [id number] HYDROCHLOROTHIAZIDE 25 MG  TABS (HYDROCHLOROTHIAZIDE) Take 1 tablet by mouth once a day  #31 x 3   Entered by:   Mliss Sax MD   Authorized by:   [name] MD   Signed by:   Mliss Sax MD on 10/28/2009   Method used:   Electronically to        [facility] (retail)       [address]       [place]  [place], [place]  [address]       Ph: [phone]       Fax: [phone]   RxID:   [id number] LISINOPRIL 20 MG TABS (LISINOPRIL) Take 1 tablet by mouth once a day  #30 x 2   Entered by:   Mliss Sax MD   Authorized by:   [name] MD   Signed by:   Mliss Sax MD on 10/28/2009   Method used:   Electronically to        [facility] (retail)       [address]       [place], [place]  [address]       Ph: [phone]       Fax: [phone]   RxID:   [id number]   Prevention & Chronic Care Immunizations   Influenza vaccine: Fluvax 3+  (03/19/2008)    Tetanus booster: 01/23/2009: Td    Pneumococcal vaccine: Not documented  Colorectal Screening   Hemoccult: Not documented   Hemoccult action/deferral: Not indicated  (10/28/2009)    Colonoscopy: Not documented   Colonoscopy action/deferral: Deferred  (10/28/2009)  Other Screening   Pap smear: NEGATIVE FOR INTRAEPITHELIAL LESIONS OR MALIGNANCY.  (02/22/2008)   Pap smear action/deferral: Deferred-3 yr interval  (10/28/2009)    Mammogram: Not documented   Mammogram action/deferral: Deferred-2 yr interval  (10/28/2009)   Smoking status: quit > 6 months  (10/28/2009)  Diabetes Mellitus   HgbA1C: 9.0  (10/09/2009)   Hemoglobin A1C due:  05/19/2007    Eye exam: Not documented   Diabetic eye exam action/deferral: Not indicated  (10/28/2009)   Eye exam due: 03/13/2007    Foot exam: yes  (10/28/2009)   Foot exam action/deferral: Do today   High risk foot: No  (10/28/2009)   Foot care education: Done  (10/28/2009)   Foot exam due: 03/14/2008    Urine microalbumin/creatinine ratio: 12.3  (09/29/2008)   Urine microalbumin/cr due: 10/06/2007    Diabetes flowsheet reviewed?: Yes   Progress toward A1C goal: Unchanged  Lipids   Total Cholesterol: 156  (10/09/2009)   LDL: 79  (10/09/2009)   LDL Direct: Not documented   HDL: 47  (10/09/2009)   Triglycerides: 150  (10/09/2009)    SGOT (AST): 14  (09/21/2009)   SGPT (ALT): 12  (09/21/2009)   Alkaline phosphatase: 60  (09/21/2009)   Total bilirubin: 0.2  (09/21/2009)    Lipid flowsheet reviewed?: Yes   Progress toward LDL goal: At goal  Hypertension   Last Blood Pressure: 99 / 69  (10/28/2009)   Serum creatinine: 0.90  (09/21/2009)   Serum potassium 4.8  (09/21/2009)    Hypertension flowsheet reviewed?: Yes   Progress toward BP goal: At goal  Self-Management Support :   Personal Goals (by the next clinic visit) :     Personal A1C goal: 7  (07/01/2009)     Personal blood pressure goal: 130/80  (07/01/2009)     Personal LDL goal: 70  (07/01/2009)    Diabetes self-management support: Written self-care plan  (10/28/2009)   Diabetes care plan printed   Last medical nutrition therapy: 02/19/2007    Hypertension self-management support: BP self-monitoring log, Written self-care plan  (10/28/2009)   Hypertension self-care plan printed.    Lipid self-management support: Written self-care plan  (10/28/2009)   Lipid self-care plan printed.   Nursing Instructions: Give Flu vaccine today Diabetic foot exam today    Diabetic Foot Exam Foot Inspection Is  there a history of a foot ulcer?              No Is there a foot ulcer now?              No Can the patient  see the bottom of their feet?          No Are the shoes appropriate in style and fit?          No Is there swelling or an abnormal foot shape?          No Are the toenails long?                No Are the toenails thick?                No Are the toenails ingrown?              No Is there heavy callous build-up?              No Is there pain in the calf muscle (Intermittent claudication) when walking?    NoIs there a claw toe deformity?              No Is there elevated skin temperature?            No Is there limited ankle dorsiflexion?            No Is there foot or ankle muscle weakness?            No  Diabetic Foot Care Education Patient educated on appropriate care of diabetic feet.   High Risk Feet? No   10-g (5.07) Semmes-Weinstein Monofilament Test           Right Foot          Left Foot Visual Inspection     normal           normal Test Control      normal         normal Site 1         normal         normal Site 2         normal         normal Site 3         normal         normal Site 4         normal         normal Site 5         normal         normal Site 6         normal         normal Site 7         normal         normal Site 8         normal         normal Site 9         normal         normal Site 10         normal         normal  Impression      normal         normal

## 2010-07-27 NOTE — Letter (Signed)
Summary: PHYSICIAN ORDER REQUUEST  PHYSICIAN ORDER REQUUEST   Imported By: Margie Billet 11/18/2009 10:52:12  _____________________________________________________________________  External Attachment:    Type:   Image     Comment:   External Document

## 2010-07-27 NOTE — Progress Notes (Signed)
Summary: med refill/gp  Phone Note Refill Request Message from:  Patient on May 14, 2010 12:14 PM  Refills Requested: Medication #1:  LISINOPRIL 20 MG TABS Take 1 tablet by mouth once a day  Medication #2:  NOVOLOG FLEXPEN 100 UNIT/ML SOLN Inject 3 units 10 minutes before breakfast Last appt. 10/28/09.   Method Requested: Electronic Initial call taken by: Chinita Pester RN,  May 14, 2010 12:16 PM  Follow-up for Phone Call        Will only give 1 refill.  Needs app't. Follow-up by: Ulyess Mort MD,  May 14, 2010 5:26 PM  Additional Follow-up for Phone Call Additional follow up Details #1::        Pt. has an appt. in January. Additional Follow-up by: Chinita Pester RN,  May 17, 2010 2:10 PM    Prescriptions: NOVOLOG FLEXPEN 100 UNIT/ML SOLN (INSULIN ASPART) Inject 3 units 10 minutes before breakfast, 2 units 10 minutes before lunch and 5 units 10 minutes before dinner  #1 month x 0   Entered and Authorized by:   Ulyess Mort MD   Signed by:   Ulyess Mort MD on 05/14/2010   Method used:   Electronically to        [facility] (retail)       [address]       [place], [place]  [address]       Ph: [phone]       Fax: [phone]   RxID:   [id number] LISINOPRIL 20 MG TABS (LISINOPRIL) Take 1 tablet by mouth once a day  #30 x 0   Entered and Authorized by:   Ulyess Mort MD   Signed by:   Ulyess Mort MD on 05/14/2010   Method used:   Electronically to        [facility] (retail)       [address]       [place], [place]  [address]       Ph: [phone]       Fax: [phone]   RxID:   [id number]

## 2010-07-27 NOTE — Progress Notes (Signed)
Summary: refills/dmr  Phone Note Outgoing Call   Call placed by: Jamison Neighbor RD,CDE,  Nov 19, 2009 4:41 PM Summary of Call: Patient requested refills on lisinopril, metformin, glipizide- I recommend glipizide be discontinued now that patient using meal time insulin to control blood sugars. Initial call taken by: Jamison Neighbor RD,CDE,  Nov 19, 2009 4:43 PM    Will discontinue Glipizide as recommended. Thank you.

## 2010-07-27 NOTE — Progress Notes (Signed)
Summary: refill/ hla  Phone Note Refill Request Message from:  Patient on Nov 19, 2009 5:25 PM  Refills Requested: Medication #1:  METFORMIN HCL 1000 MG TABS Take 1 tablet by mouth two times a day  Medication #2:  LISINOPRIL 20 MG TABS Take 1 tablet by mouth once a day Initial call taken by: Marin Roberts RN,  Nov 19, 2009 5:26 PM    Done.

## 2010-07-27 NOTE — Miscellaneous (Signed)
Summary: [facility]  [facility]   Imported By: Shon Hough 01/26/2010 14:36:56  _____________________________________________________________________  External Attachment:    Type:   Image     Comment:   External Document

## 2010-07-27 NOTE — Progress Notes (Signed)
Summary: refill/ hla  Phone Note Refill Request Message from:  Patient on July 16, 2009 4:19 PM  Refills Requested: Medication #1:  METFORMIN HCL 1000 MG TABS Take 1 tablet by mouth two times a day   Last Refilled: 12/10 Initial call taken by: Marin Roberts RN,  July 16, 2009 4:19 PM    Prescriptions: METFORMIN HCL 1000 MG TABS (METFORMIN HCL) Take 1 tablet by mouth two times a day  #60 x 3   Entered and Authorized by:   [name] MD   Signed by:   [name] MD on 07/17/2009   Method used:   Electronically to        [facility] (retail)       [address]       [place], [place]  [address]       Ph: [phone]       Fax: [phone]   RxID:   [id number]

## 2010-07-27 NOTE — Progress Notes (Signed)
Summary: refill/gg  Phone Note Refill Request   Refills Requested: Medication #1:  LANTUS 100 UNIT/ML  SOLN Inject 20 units once at night  Medication #2:  TRAMADOL HCL 50 MG TABS 1 tab by mouth q 6 hrs as needed for severe pain  Medication #3:  AMITRIPTYLINE HCL 25 MG TABS One tab by mouth QHS Pt wants refill on promethazine, having nausea   Method Requested: Electronic Initial call taken by: Merrie Roof RN,  May 10, 2010 9:46 AM  Follow-up for Phone Call        I did not see Phenergan on her present or past med list. Deny Phenergan.  Last seen 5/4 and [facility] so I sent a flag to Ms Lissa Hoard to schedule an appt.  Follow-up by: Blanch Media MD,  May 10, 2010 3:36 PM    Prescriptions: TRAMADOL HCL 50 MG TABS (TRAMADOL HCL) 1 tab by mouth q 6 hrs as needed for severe pain  #60 x 1   Entered and Authorized by:   Blanch Media MD   Signed by:   Blanch Media MD on 05/10/2010   Method used:   Electronically to        [name] (retail)       [address]       [place], [place]  [address]       Ph: [phone]       Fax: [phone]   RxID:   [id number] AMITRIPTYLINE HCL 25 MG TABS (AMITRIPTYLINE HCL) One tab by mouth QHS  #30 x 1   Entered and Authorized by:   Blanch Media MD   Signed by:   Blanch Media MD on 05/10/2010   Method used:   Electronically to        [facility] (retail)       [address]       [place], [place]  [address]       Ph: [phone]       Fax: [phone]   RxID:   [id number] LANTUS 100 UNIT/ML  SOLN (INSULIN GLARGINE) Inject 20 units once at night  #1 x 1   Entered and Authorized by:   Blanch Media MD   Signed by:   Blanch Media MD on 05/10/2010   Method used:   Electronically to        [facility] (retail)       [address]       [place], [place]  [address]       Ph: [phone]       Fax:  [phone]   RxID:   [id number]

## 2010-07-27 NOTE — Assessment & Plan Note (Signed)
Summary: DM TEACHING/CH   Allergies: No Known Drug Allergies   Complete Medication List: 1)  Lantus 100 Unit/ml Soln (Insulin glargine) .... Inject 20 units once at night 2)  Metformin Hcl 1000 Mg Tabs (Metformin hcl) .... Take 1 tablet by mouth two times a day 3)  Lisinopril 20 Mg Tabs (Lisinopril) .... Take 1 tablet by mouth once a day 4)  Hydrochlorothiazide 25 Mg Tabs (Hydrochlorothiazide) .... Take 1 tablet by mouth once a day 5)  Neurontin 600 Mg Tabs (Gabapentin) .... Take 1 tablet by mouth three times a day 6)  Pravachol 40 Mg Tabs (Pravastatin sodium) .... Take 1 tablet by mouth once a day 7)  Ferrous Sulfate 324 Mg Tbec (Ferrous sulfate) .... Take 1 tablet by mouth once daily 8)  Prodigy Autocode Blood Glucose Strp (Glucose blood) .... To test blood sugar 3x daily 9)  Cvs Omeprazole 20 Mg Tbec (Omeprazole) .... Take 1 tablet by mouth two times a day 10)  Prodigy Twist Top Lancets 28g Misc (Lancets) .... Use to test blood sugar 3x a day 11)  Prodigy Autocode Blood Glucose Devi (Blood glucose monitoring suppl) .... Use to check blood sugar 3x daily 12)  Prodigy Insulin Syringe 31g X 5/16" 0.3 Ml Misc (Insulin syringe-needle u-100) .... Use to inject your lantus once daily- 250.00 13)  Tramadol Hcl 50 Mg Tabs (Tramadol hcl) .Marland Kitchen.. 1 tab by mouth q 6 hrs as needed for severe pain 14)  Amitriptyline Hcl 25 Mg Tabs (Amitriptyline hcl) .... One tab by mouth qhs 15)  Novolog Flexpen 100 Unit/ml Soln (Insulin aspart) .... Inject 3 units 10 minutes before breakfast, 2 units 10 minutes before lunch and 2 units 10 minutes before dinner 16)  Prodigy Mini Pen Needles 31g X 5 Mm Misc (Insulin pen needle) .... Use to inject insulin 3 times daily  Other Orders: DSMT(Medicare) Individual, 30 Minutes (Z6109)  Patient Instructions: 1)  Check blood sugar at least 4 times a day: before each meal and at bedtime 2)  Stop glipizide tomorrow. 3)  Inject 3 units Novolog 10 minutes before breakfast, 2  units before lunch and dinner. 4)  PLease write down blood sugars on sheet of paper provided for 3 days prior to next visit. write down times of meals and estimate size of meals as indicated.  5)  bring paper back to next visit in 1-2 weeks  Diabetes Self Management Training  PCP: [name] MD Referring MD: Noel Gerold Date diagnosed with diabetes: 06/27/1984 Diabetes Type: Type 2 insulin treated Other persons present: daughter in waiting room Current smoking Status: quit > 6 months  Assessment Work Hours: Not currently working Daily activities: tried ot get out but admits that low vision inhibits her getting out or driing Sources of Support: eye doctor recommended better control might sae her vision ( from going  blind)  Knowledge Deficits: did nopt understand purpose of second type of  insulin Special needs or Barriers: Does not drive because of vision  Potential Barriers  Visual Impairment  Transportation  Economic/Supplies  Support  Diabetes Medications:  Lipid lowering Meds? Yes Anti-platelet Meds? No Herbs or Supplements: No Comments: has  aone touch meter but uses [facility] and they sen ther an Embrace meter with download cable. sheila reports that she is testing blood sugars daily, but meter only has CBGs from 4/20-4/30 and it does hav eth correct date. She is dual eligible so should be able to get  a mail order company to send her any  strips to nay meter. Now using the Embrace meter likely die to mail order saying she would not have to pay copays this way. CBGs 150-300 in meter. patient not displaying insulin resistance  Long Acting  Insulin Type:Lantus    Monitoring Self monitoring blood glucose 3x/day Name of Meter  pt pet device Measures urine ketones? No  Recent Episodes of: Requiring Help from another person  Hyperglycemia : Yes Hypoglycemia: No Severe Hypoglycemia : No   Wears Medical I.D. No Carrys Food for Low Blood sugar No Can you  tell if your blood sugar is low? Yes   Estimated /Usual Carb Intake Breakfast # of Carbs/Grams 5=75gm- 90, 1.5 cups raisin bran, silk milk and a banana( 5-7 carbs) Lunch # of Carbs/Grams baked meat, vegetable and fruit ( 2 carbs) Midafternoon # of Carbs/Grams chips(1-2 carbs) Dinner # of Carbs/Grams baked chicken , greens, field peas and rice ( 3-5 carbs) Bedtime # of Carbs/Grams 1-2 carbs  Nutrition assessment ETOH : No What beverages do you drink?  water, silk milk, dit mt dew Do you read food labels?                                                                          No Biggest challenge to eating healthy: Imbalance CHO  Activity Limitations  Inadequate physical activity  Appropriate physical activity  Type of physical activity  Other  vision makes walking alone diffictul- receommend low vision therapy Diabetes Disease Process  Discussed today Define diabetes in simple terms: Needs review/assistance    Medications State name-action-dose-duration-side effects-and time to take medication: Demonstrates competency   State appropriate timing of food related to medication: Demonstrates competency   Demonstrates/verbalizes site selection and rotation for injections Demonstrates competency   Correctly draw up and administer insulin-Byetta-Symlin-glucagon: Demonstrates competency    Nutritional Management Identify what foods most often affect blood glucose: Demonstrates competency    State changes planned for home meals/snacks: Needs review/assistance    Monitoring State purpose and frequency of monitoring BG-ketones-HgbA1C  : Needs review/assistance   Perform glucose monitoring/ketone testing and record results correctly: Demonstrates competencyState target blood glucose and HgbA1C goals: Needs review/assistance    Complications State the causes-signs and symptoms and prevention of Hyperglycemia: Needs review/assistance   Explain proper treatment of hyperglycemia:  Needs review/assistance   State the causes- signs and symptoms and prevention of hypoglycemia: Needs review/assistanceGlucose control and the development/prevention of long-term complications: Demonstrates competency    Exercise Diabetes Management Education Done: 10/28/2009    BEHAVIORAL GOALS INITIAL Utilizing medications if for therapeutic effectiveness: inject insulin before meals 3x daily Monitoring blood glucose levels daily: write down blood sugar on sheet provided and bring to next appointment        to start set and safe doses Novlog today 3 units before breakfast, 2 units before lunch and dinner. plan to get her started and confident in new insulin  then titrate to more realistic food coverage amount and add a correction scale to food doses. Suggest using a 1:50 corretcion starting at 150.   Pt wants fastings CBGs to be 80-110 mg/dl.  She is eating healthy by report.  Afraid to walk for exercise due to eye surgeries.  leaving meds to adjust  to help improve CBGs, little flucuations in CBgs except around time of eye surgery or old lantus use- not sure which.  Recommend againn referral for low vision services. will discuss with doctor  f/u with me in 1 week.

## 2010-07-27 NOTE — Progress Notes (Signed)
Summary: med refill/gp  Phone Note Refill Request Message from:  Fax from Pharmacy on Nov 19, 2009 12:36 PM  Refills Requested: Medication #1:  METFORMIN HCL 1000 MG TABS Take 1 tablet by mouth two times a day   Last Refilled: 10/19/2009                 #2 Glipizide10mg  - take one tab by mouth twice daily  Qty 60 Last refill - 10/16/09   Method Requested: Electronic Initial call taken by: Chinita Pester RN,  Nov 19, 2009 12:37 PM    Prescriptions: METFORMIN HCL 1000 MG TABS (METFORMIN HCL) Take 1 tablet by mouth two times a day  #60 x 3   Entered and Authorized by:   [name] MD   Signed by:   [name] MD on 11/20/2009   Method used:   Electronically to        [facility] (retail)       [address]       [place], [place]  [address]       Ph: [phone]       Fax: [phone]   RxID:   [id number]

## 2010-07-27 NOTE — Progress Notes (Signed)
Summary: med refill/gp  Phone Note Refill Request Message from:  Fax from Pharmacy on March 22, 2010 11:55 AM  Refills Requested: Medication #1:  METFORMIN HCL 1000 MG TABS Take 1 tablet by mouth two times a day   Last Refilled: 02/22/2010 Last appt.May 4,2011.   Method Requested: Electronic Initial call taken by: Chinita Pester RN,  March 22, 2010 11:56 AM    Prescriptions: METFORMIN HCL 1000 MG TABS (METFORMIN HCL) Take 1 tablet by mouth two times a day  #60 x 3   Entered and Authorized by:   [name] MD   Signed by:   [name] MD on 03/23/2010   Method used:   Electronically to        [facility] (retail)       [address]       [place], [place]  [address]       Ph: [phone]       Fax: [phone]   RxID:   [id number]

## 2010-07-27 NOTE — Progress Notes (Signed)
Summary: Refill/gh  Phone Note Refill Request Message from:  Fax from Pharmacy on August 24, 2009 9:54 AM  Refills Requested: Medication #1:  LISINOPRIL 20 MG TABS Take 1 tablet by mouth once a day   Last Refilled: 06/24/2009 Last Office visit was 07-01-2009.  Last labs were 01-15-2009   Method Requested: Electronic Initial call taken by: Angelina Ok RN,  August 24, 2009 9:56 AM  Follow-up for Phone Call        She is definitely due for labs given her DM status etc.. Will refill her Lisinopril today with 2 refills.  I will also put in labs for her if we could make a lab appointment for her-fasting if possible. Since her A1C was 8 at her last visit she will need an Appointment in early April the 3 month mark.  Follow-up by: Julaine Fusi  DO,  August 24, 2009 10:29 AM  Additional Follow-up for Phone Call Additional follow up Details #1::        [facility]. Additional Follow-up by: Angelina Ok RN,  August 24, 2009 6:01 PM    Prescriptions: LISINOPRIL 20 MG TABS (LISINOPRIL) Take 1 tablet by mouth once a day  #30 x 2   Entered and Authorized by:   Julaine Fusi  DO   Signed by:   Julaine Fusi  DO on 08/24/2009   Method used:   Electronically to        [facility] (retail)       [address]       [place], [place]  [address]       Ph: [phone]       Fax: [phone]   RxID:   [id number]

## 2010-07-27 NOTE — Progress Notes (Signed)
Summary: diabetes support/dmr  Phone Note Outgoing Call   Call placed by: Jamison Neighbor RD,CDE,  Nov 12, 2009 2:04 PM Summary of Call: CDE called to follow up on new to Novolog prandial insulin & missed appointment yesterday(adopted son broke both arms) : patient says eyes and head better,  she feels better in general. this morning her CBG was 123,  215 after eating lunch and 2 units Novolog yesterday, 165 last night at bedtime. Increased her dinner to 5 units because that is her biggest meal. Not eating bedtime snack like she was. Rescheduled CDE appointment for next thursday @ 1:30 PM    New/Updated Medications: NOVOLOG FLEXPEN 100 UNIT/ML SOLN (INSULIN ASPART) Inject 3 units 10 minutes before breakfast, 2 units 10 minutes before lunch and 5 units 10 minutes before dinner

## 2010-07-29 NOTE — Op Note (Signed)
Summary: consent  consent   Imported By: Marily Memos 07/14/2010 13:50:33  _____________________________________________________________________  External Attachment:    Type:   Image     Comment:   External Document

## 2010-07-29 NOTE — Assessment & Plan Note (Signed)
Summary: FU/RECHECK BP./SB.   Vital Signs:  Patient profile:   50 year old female Height:      68 inches (172.72 cm) Weight:      254.2 pounds (113.59 kg) BMI:     38.13 Temp:     97.5 degrees F (36.39 degrees C) oral Pulse rate:   51 / minute BP sitting:   140 / 74  (right arm) Cuff size:   large  Vitals Entered By: Theotis Barrio NT II (June 16, 2010 8:37 AM) CC: PATIENT IS HERE FOR BP CHECK ONLY Is Patient Diabetic? Yes Did you bring your meter with you today? No Pain Assessment Patient in pain? no      Nutritional Status BMI of > 30 = obese CBG Result 162  Have you ever been in a relationship where you felt threatened, hurt or afraid?No   Does patient need assistance? Functional Status Self care Ambulation Normal   Primary Care Provider:  [name], MD  CC:  PATIENT IS HERE FOR BP CHECK ONLY.  History of Present Illness: [name] is a 50 year old woman with pmh significant for DM (A1C 8.5), HLD, and HTN who presents today for blood pressure check after HCTZ was discontinued last week.   Patient says she is feeling fine without complaints. She has not taken her Lisinopril this morning.   She denies any recent cp, cough, sob, abdominal pain, n/v, changes in bowel habits, and urinary habits.  She checks her blood sugars 3 times daily and states they run in the 150s. She takes her lantus and novolog accordingly. No episodes of hypoglycemia with lightheadedness or dizzinesss.   No other complaints or concerns today.   Preventive Screening-Counseling & Management  Alcohol-Tobacco     Alcohol drinks/day: 0     Smoking Status: quit > 6 months     Smoking Cessation Counseling: no     Packs/Day: <0.25     Year Started: started back since quitting in 2008     Year Quit: 2009  Caffeine-Diet-Exercise     Does Patient Exercise: no     Type of exercise: WALKING     Times/week:   2  Current Medications (verified): 1)  Lantus 100 Unit/ml  Soln  (Insulin Glargine) .... Inject 22 Units Once At Night 2)  Metformin Hcl 1000 Mg Tabs (Metformin Hcl) .... Take 1 Tablet By Mouth Two Times A Day 3)  Lisinopril 20 Mg Tabs (Lisinopril) .... Take 1 Tablet By Mouth Once A Day 4)  Neurontin 600 Mg Tabs (Gabapentin) .... Take 1 Tablet By Mouth Three Times A Day 5)  Pravachol 40 Mg  Tabs (Pravastatin Sodium) .... Take 1 Tablet By Mouth Once A Day 6)  Ferrous Sulfate 324 Mg Tbec (Ferrous Sulfate) .... Take 1 Tablet By Mouth Once Daily 7)  Prodigy Autocode Blood Glucose   Strp (Glucose Blood) .... To Test Blood Sugar 3x Daily 8)  Cvs Omeprazole 20 Mg Tbec (Omeprazole) .... Take 1 Tablet By Mouth Two Times A Day 9)  Prodigy Twist Top Lancets 28g  Misc (Lancets) .... Use To Test Blood Sugar 3x A Day 10)  Prodigy Autocode Blood Glucose  Devi (Blood Glucose Monitoring Suppl) .... Use To Check Blood Sugar 3x Daily 11)  Prodigy Insulin Syringe 31g X 5/16" 0.3 Ml Misc (Insulin Syringe-Needle U-100) .... Use To Inject Your Lantus Once Daily- 250.00 12)  Tramadol Hcl 50 Mg Tabs (Tramadol Hcl) .Marland Kitchen.. 1 Tab By Mouth Q 6 Hrs As Needed For  Severe Pain 13)  Amitriptyline Hcl 25 Mg Tabs (Amitriptyline Hcl) .... One Tab By Mouth Qhs 14)  Novolog Flexpen 100 Unit/ml Soln (Insulin Aspart) .... Inject 3 Units 10 Minutes Before Breakfast, 2 Units 10 Minutes Before Lunch and 5 Units 10 Minutes Before Dinner 15)  Prodigy Mini Pen Needles 31g X 5 Mm Misc (Insulin Pen Needle) .... Use To Inject Insulin 3 Times Daily 16)  Naprosyn 500 Mg Tabs (Naproxen) .... Take One Tablet By Mouth Two Times A Day As Needed With Meals For Ankle Pain and Swelling  Allergies (verified): No Known Drug Allergies  Past History:  Past Medical History: Last updated: 03/12/2009 Current Problems:  OBESITY (ICD-278.00) TOBACCO ABUSE (ICD-305.1) PERIPHERAL NEUROPATHY (ICD-356.9) HYPERTENSION (ICD-401.9) HYPERLIPIDEMIA (ICD-272.4) DIABETES MELLITUS, TYPE II (ICD-250.00) Diabetic retinopathy with  some vision loss Inadequate material resources glacoma  Past Surgical History: Last updated: 03/12/2009 Cataract Extraction Glacoma surgery  Family History: Last updated: 03/12/2009 Family History of Diabetes: maternal aunt No FH of Colon Cancer:  Social History: Last updated: 03/12/2009 Occupation: was a Lawyer, currently unemployed, 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  BP slightly elevated since pt did not take anti-hypertensive this morning Head:  normocephalic and atraumatic.   Neck:  supple.   Lungs:  normal respiratory effort and normal breath sounds.   Heart:  normal rate and regular rhythm.   Abdomen:  soft and non-tender.   Msk:  normal ROM.   Extremities:  no lower extremity edema present  Neurologic:  nonfocal    Impression & Recommendations:  Problem # 1:  HYPERTENSION (ICD-401.9) BP slightly up, however patient states she has not taken her Lisinopril this morning. Reviewed prior blood pressures with patient, which all run between 100-130/80. If blood pressure continues to run 140 and above, may consider starting HCTZ back up, perhaps at a lower dose, ie 12.5 mg, as opposed to the 25 mg by mouth once daily. Will get labs at next visit, pt had appt with podiatrist, was running late.   Her updated medication list for this problem includes:    Lisinopril 20 Mg Tabs (Lisinopril) .Marland Kitchen... Take 1 tablet by mouth once a day  BP today: 140/74 Prior BP: 114/68 (06/10/2010)  Labs Reviewed: K+: 4.8 (09/21/2009) Creat: : 0.90 (09/21/2009)   Chol: 156 (10/09/2009)   HDL: 47 (10/09/2009)   LDL: 79 (10/09/2009)   TG: 150 (10/09/2009)  Problem # 2:  DIABETES  MELLITUS, TYPE II (ICD-250.00) Assessment: Improved Patient is really trying to get her diabetes under better control. A1C has improved. Encouraged patient to continue with her efforts. No changes in regimen today.  Her updated medication list for this problem includes:    Lantus 100 Unit/ml Soln (Insulin glargine) ..... Inject 22 units once at night    Metformin Hcl 1000 Mg Tabs (Metformin hcl) .Marland Kitchen... Take 1 tablet by mouth two times a day    Lisinopril 20 Mg Tabs (Lisinopril) .Marland Kitchen... Take 1 tablet by mouth once a day    Novolog Flexpen 100 Unit/ml Soln (Insulin aspart) ..... Inject 3 units 10 minutes before breakfast, 2 units 10 minutes before lunch and 5 units 10 minutes before dinner  Orders: Capillary Blood Glucose/CBG (  [address])  Labs Reviewed: Creat: 0.90 (09/21/2009)     Last Eye Exam: Proliferative diabetic retinopathy.  OD Glaucoma.  OU  (10/15/2009) Reviewed HgBA1c results: 8.5 (06/10/2010)  9.0 (10/09/2009)  Problem # 3:  HYPERLIPIDEMIA (ICD-272.4) At goal, labs are up to date. Will continue current statin.  Her updated medication list for this problem includes:    Pravachol 40 Mg Tabs (Pravastatin sodium) .Marland Kitchen... Take 1 tablet by mouth once a day  Complete Medication List: 1)  Lantus 100 Unit/ml Soln (Insulin glargine) .... Inject 22 units once at night 2)  Metformin Hcl 1000 Mg Tabs (Metformin hcl) .... Take 1 tablet by mouth two times a day 3)  Lisinopril 20 Mg Tabs (Lisinopril) .... Take 1 tablet by mouth once a day 4)  Neurontin 600 Mg Tabs (Gabapentin) .... Take 1 tablet by mouth three times a day 5)  Pravachol 40 Mg Tabs (Pravastatin sodium) .... Take 1 tablet by mouth once a day 6)  Ferrous Sulfate 324 Mg Tbec (Ferrous sulfate) .... Take 1 tablet by mouth once daily 7)  Prodigy Autocode Blood Glucose Strp (Glucose blood) .... To test blood sugar 3x daily 8)  Cvs Omeprazole 20 Mg Tbec (Omeprazole) .... Take 1 tablet by mouth two times a day 9)  Prodigy Twist Top  Lancets 28g Misc (Lancets) .... Use to test blood sugar 3x a day 10)  Prodigy Autocode Blood Glucose Devi (Blood glucose monitoring suppl) .... Use to check blood sugar 3x daily 11)  Prodigy Insulin Syringe 31g X 5/16" 0.3 Ml Misc (Insulin syringe-needle u-100) .... Use to inject your lantus once daily- 250.00 12)  Tramadol Hcl 50 Mg Tabs (Tramadol hcl) .Marland Kitchen.. 1 tab by mouth q 6 hrs as needed for severe pain 13)  Amitriptyline Hcl 25 Mg Tabs (Amitriptyline hcl) .... One tab by mouth qhs 14)  Novolog Flexpen 100 Unit/ml Soln (Insulin aspart) .... Inject 3 units 10 minutes before breakfast, 2 units 10 minutes before lunch and 5 units 10 minutes before dinner 15)  Prodigy Mini Pen Needles 31g X 5 Mm Misc (Insulin pen needle) .... Use to inject insulin 3 times daily 16)  Naprosyn 500 Mg Tabs (Naproxen) .... Take one tablet by mouth two times a day as needed with meals for ankle pain and swelling  Patient Instructions: 1)  Please schedule a follow-up appointment in 3 months.   Orders Added: 1)  Capillary Blood Glucose/CBG [82948] 2)  Est. Patient Level III [[address]]     Prevention & Chronic Care Immunizations   Influenza vaccine: Fluvax MCR  (06/10/2010)   Influenza vaccine due: 02/26/2011    Tetanus booster: 01/23/2009: Td   Td booster deferral: Not indicated  (06/10/2010)   Tetanus booster due: 01/24/2019    Pneumococcal vaccine: Pneumovax (Medicare)  (06/10/2010)   Pneumococcal vaccine due: 12/29/2023  Colorectal Screening   Hemoccult: Not documented   Hemoccult action/deferral: Not indicated  (10/28/2009)    Colonoscopy: Not documented   Colonoscopy action/deferral: GI referral  (06/10/2010)  Other Screening   Pap smear: NEGATIVE FOR INTRAEPITHELIAL LESIONS OR MALIGNANCY.  (02/22/2008)   Pap smear action/deferral: Deferred-3 yr interval  (10/28/2009)    Mammogram: Not documented   Mammogram action/deferral: Deferred-2 yr interval  (10/28/2009)   Smoking status: quit > 6  months  (06/16/2010)  Diabetes Mellitus   HgbA1C: 8.5  (06/10/2010)   Hemoglobin A1C due: 09/09/2010    Eye exam: Proliferative diabetic retinopathy.  OD Glaucoma.  OU   (10/15/2009)   Diabetic eye exam action/deferral:  Not indicated  (10/28/2009)   Eye exam due: 10/16/2010    Foot exam: yes  (06/10/2010)   Foot exam action/deferral: Do today   High risk foot: No  (10/28/2009)   Foot care education: Done  (10/28/2009)   Foot exam due: 03/14/2008    Urine microalbumin/creatinine ratio: 12.3  (09/29/2008)   Urine microalbumin action/deferral: Ordered   Urine microalbumin/cr due: 06/11/2011    Diabetes flowsheet reviewed?: Yes   Progress toward A1C goal: Improved  Lipids   Total Cholesterol: 156  (10/09/2009)   LDL: 79  (10/09/2009)   LDL Direct: Not documented   HDL: 47  (10/09/2009)   Triglycerides: 150  (10/09/2009)   Lipid panel due: 10/10/2010    SGOT (AST): 14  (09/21/2009)   SGPT (ALT): 12  (09/21/2009)   Alkaline phosphatase: 60  (09/21/2009)   Total bilirubin: 0.2  (09/21/2009)   Liver panel due: 09/22/2010    Lipid flowsheet reviewed?: Yes   Progress toward LDL goal: At goal  Hypertension   Last Blood Pressure: 140 / 74  (06/16/2010)   Serum creatinine: 0.90  (09/21/2009)   Serum potassium 4.8  (09/21/2009)   Basic metabolic panel due: 09/22/2010    Hypertension flowsheet reviewed?: Yes   Progress toward BP goal: At goal  Self-Management Support :   Personal Goals (by the next clinic visit) :     Personal A1C goal: 7  (07/01/2009)     Personal blood pressure goal: 130/80  (07/01/2009)     Personal LDL goal: 70  (07/01/2009)    Patient will work on the following items until the next clinic visit to reach self-care goals:     Medications and monitoring: take my medicines every day, bring all of my medications to every visit, examine my feet every day  (06/16/2010)     Eating: drink diet soda or water instead of juice or soda, eat more vegetables, use  fresh or frozen vegetables, eat foods that are low in salt, eat baked foods instead of fried foods, eat fruit for snacks and desserts, limit or avoid alcohol  (06/16/2010)     Activity: take a 30 minute walk every day  (06/16/2010)    Diabetes self-management support: Resources for patients handout  (06/16/2010)   Last diabetes self-management training by diabetes educator: 10/28/2009   Last medical nutrition therapy: 02/19/2007    Hypertension self-management support: Resources for patients handout  (06/16/2010)    Lipid self-management support: Resources for patients handout  (06/16/2010)        Resource handout printed.

## 2010-07-29 NOTE — Assessment & Plan Note (Signed)
Summary: ACUTE-DIABETIC/SWOLLEN RIGHT LEG/HARD TO WALK/(VEGA)/CFB   Vital Signs:  Patient profile:   50 year old female Height:      68 inches (172.72 cm) Weight:      249.9 pounds (113.59 kg) BMI:     38.13 Temp:     97.8 degrees F oral Pulse rate:   98 / minute BP sitting:   114 / 68  (left arm) Cuff size:   large  Vitals Entered By: Chinita Pester RN (June 10, 2010 2:11 PM) CC: Right foot/top of foot/ankle swollen; started Monday. Is Patient Diabetic? Yes Did you bring your meter with you today? No Pain Assessment Patient in pain? yes     Location: ankle/top of foot Intensity: 8 Type: sharp Onset of pain  Constant Nutritional Status BMI of > 30 = obese CBG Result 93  Have you ever been in a relationship where you felt threatened, hurt or afraid?No   Does patient need assistance? Functional Status Self care Ambulation Normal   Diabetic Foot Exam    Primary Care [name]:  Carrie Everts, MD  CC:  Right foot/top of foot/ankle swollen; started Monday.Marland Kitchen  History of Present Illness: Follow up appointment. patient was seen by a podiatrist for a toe nail avulsion. Has a f/u appointment with him next week.  Preventive Screening-Counseling & Management  Alcohol-Tobacco     Alcohol drinks/day: 0     Smoking Status: quit > 6 months     Smoking Cessation Counseling: no     Packs/Day: <0.25     Year Quit: 2009  Caffeine-Diet-Exercise     Does Patient Exercise: no  Current Problems (verified): 1)  Diabetes Mellitus, Type II  (ICD-250.00) 2)  Peripheral Neuropathy  (ICD-356.9) 3)  Hypertension  (ICD-401.9) 4)  Hyperlipidemia  (ICD-272.4) 5)  Gerd  (ICD-530.81) 6)  Adhesive Capsulitis, Left  (ICD-726.0) 7)  Obesity  (ICD-278.00) 8)  Sexually Transmitted Disease  (ICD-099.9) 9)  Unspecified Anemia  (ICD-285.9) 10)  Leukocytosis Unspecified  (ICD-288.60) 11)  Inadequate Material Resources  (ICD-V60.2)  Current Medications (verified): 1)  Lantus 100  Unit/ml  Soln (Insulin Glargine) .... Inject 20 Units Once At Night 2)  Metformin Hcl 1000 Mg Tabs (Metformin Hcl) .... Take 1 Tablet By Mouth Two Times A Day 3)  Lisinopril 20 Mg Tabs (Lisinopril) .... Take 1 Tablet By Mouth Once A Day 4)  Hydrochlorothiazide 25 Mg  Tabs (Hydrochlorothiazide) .... Take 1 Tablet By Mouth Once A Day 5)  Neurontin 600 Mg Tabs (Gabapentin) .... Take 1 Tablet By Mouth Three Times A Day 6)  Pravachol 40 Mg  Tabs (Pravastatin Sodium) .... Take 1 Tablet By Mouth Once A Day 7)  Ferrous Sulfate 324 Mg Tbec (Ferrous Sulfate) .... Take 1 Tablet By Mouth Once Daily 8)  Prodigy Autocode Blood Glucose   Strp (Glucose Blood) .... To Test Blood Sugar 3x Daily 9)  Cvs Omeprazole 20 Mg Tbec (Omeprazole) .... Take 1 Tablet By Mouth Two Times A Day 10)  Prodigy Twist Top Lancets 28g  Misc (Lancets) .... Use To Test Blood Sugar 3x A Day 11)  Prodigy Autocode Blood Glucose  Devi (Blood Glucose Monitoring Suppl) .... Use To Check Blood Sugar 3x Daily 12)  Prodigy Insulin Syringe 31g X 5/16" 0.3 Ml Misc (Insulin Syringe-Needle U-100) .... Use To Inject Your Lantus Once Daily- 250.00 13)  Tramadol Hcl 50 Mg Tabs (Tramadol Hcl) .Marland Kitchen.. 1 Tab By Mouth Q 6 Hrs As Needed For Severe Pain 14)  Amitriptyline Hcl 25 Mg Tabs (  Amitriptyline Hcl) .... One Tab By Mouth Qhs 15)  Novolog Flexpen 100 Unit/ml Soln (Insulin Aspart) .... Inject 3 Units 10 Minutes Before Breakfast, 2 Units 10 Minutes Before Lunch and 5 Units 10 Minutes Before Dinner 16)  Prodigy Mini Pen Needles 31g X 5 Mm Misc (Insulin Pen Needle) .... Use To Inject Insulin 3 Times Daily  Allergies (verified): No Known Drug Allergies  Past History:  Past Medical History: Last updated: 03/12/2009 Current Problems:  OBESITY (ICD-278.00) TOBACCO ABUSE (ICD-305.1) PERIPHERAL NEUROPATHY (ICD-356.9) HYPERTENSION (ICD-401.9) HYPERLIPIDEMIA (ICD-272.4) DIABETES MELLITUS, TYPE II (ICD-250.00) Diabetic retinopathy with some vision  loss Inadequate material resources glacoma  Past Surgical History: Last updated: 03/12/2009 Cataract Extraction Glacoma surgery  Family History: Last updated: 03/12/2009 Family History of Diabetes: maternal aunt No FH of Colon Cancer:  Social History: Last updated: 03/12/2009 Occupation: was a Lawyer, currently unemployed, 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   Eyes:  PERRLA.  EOMI. strabismus.  Sclerae and conjunctivae within normal limits.  Ears:  no external deformities.   Nose:  no external deformity.   Mouth:  pharynx pink and moist.   Neck:  supple.   Lungs:  Normal respiratory effort, chest expands symmetrically. Lungs are clear to auscultation, no crackles or wheezes. Heart:  Normal rate and regular rhythm. S1 and S2 normal without gallop, murmur, click, rub or other extra sounds. Abdomen:  soft, non-tender, normal bowel sounds, no distention, no masses, and no guarding.   Msk:  L big toe tender to palpation most pronounced at base of nail bed, not swollen, warm, or erythematous. Reduced ROM 2/2 pain. Back tenderness at flanks bilaterally. Pulses:  2+ radial pulses Extremities:  Warm, dry.  No clubbing, cyanosis, or edema. Neurologic:  No gross defecit. Skin:  turgor normal, color normal, and no rashes.   Psych:  Oriented X3, memory intact for recent and remote, normally interactive, good eye contact, not anxious appearing, and not depressed appearing.    Diabetes Management Exam:    Foot Exam (with socks and/or shoes not  present):       Sensory-Pinprick/Light touch:          Left medial foot (L-4): diminished          Left dorsal foot (L-5): diminished          Left lateral foot (S-1): diminished          Right medial foot (L-4): diminished          Right dorsal foot (L-5): diminished          Right lateral foot (S-1): diminished       Sensory-Monofilament:          Left foot: diminished          Right foot: diminished       Inspection:          Left foot: abnormal             Comments: onychomycosis to multiple toe nails.          Right foot: abnormal             Comments: Severely ingown and  log toe nails       Nails:          Left foot: normal    Foot Exam by Podiatrist:       Date: 06/10/2010       Results: early diabetic findings       Done by: Denton Meek   Impression & Recommendations:  Problem # 1:  DIABETES MELLITUS, TYPE II (ICD-250.00) Assessment Improved Still suboptimal control. will increase lantus dose to 22 Units Sq hs; instructed not to skip any meals. patient is to follow up with her podiatrist for ingrown toenails --importance of a follow up was emphsaized to the patient. Her updated medication list for this problem includes:    Lantus 100 Unit/ml Soln (Insulin glargine) ..... Inject 22 units once at night    Metformin Hcl 1000 Mg Tabs (Metformin hcl) .Marland Kitchen... Take 1 tablet by mouth two times a day    Lisinopril 20 Mg Tabs (Lisinopril) .Marland Kitchen... Take 1 tablet by mouth once a day    Novolog Flexpen 100 Unit/ml Soln (Insulin aspart) ..... Inject 3 units 10 minutes before breakfast, 2 units 10 minutes before lunch and 5 units 10 minutes before dinner  Orders: T- Capillary Blood Glucose (66440) T-Hgb A1C (in-house) (34742VZ) T-Urine Microalbumin w/creat. ratio [id number])  Labs Reviewed: Creat: 0.90 (09/21/2009)     Last Eye Exam: Proliferative diabetic retinopathy.  OD Glaucoma.  OU  (10/15/2009) Reviewed HgBA1c results: 9.0 (10/09/2009)  8.0 (07/01/2009)  Her updated  medication list for this problem includes:    Lantus 100 Unit/ml Soln (Insulin glargine) ..... Inject 20 units once at night    Metformin Hcl 1000 Mg Tabs (Metformin hcl) .Marland Kitchen... Take 1 tablet by mouth two times a day    Lisinopril 20 Mg Tabs (Lisinopril) .Marland Kitchen... Take 1 tablet by mouth once a day    Novolog Flexpen 100 Unit/ml Soln (Insulin aspart) ..... Inject 3 units 10 minutes before breakfast, 2 units 10 minutes before lunch and 5 units 10 minutes before dinner  Problem # 2:  HYPERTENSION (ICD-401.9) Assessment: Deteriorated  BP is on a lower side. will D/C HCTZ and reevluate in 7 days. The following medications were removed from the medication list:    Hydrochlorothiazide 25 Mg Tabs (Hydrochlorothiazide) .Marland Kitchen... Take 1 tablet by mouth once a day Her updated medication list for this problem includes:    Lisinopril 20 Mg Tabs (Lisinopril) .Marland Kitchen... Take 1 tablet by mouth once a day  Prior BP: 99/69 (10/28/2009)  Labs Reviewed: K+: 4.8 (09/21/2009) Creat: : 0.90 (09/21/2009)   Chol: 156 (10/09/2009)   HDL: 47 (10/09/2009)   LDL: 79 (10/09/2009)   TG: 150 (10/09/2009)  Her updated medication list for this problem includes:    Lisinopril 20 Mg Tabs (Lisinopril) .Marland Kitchen... Take 1 tablet by mouth once a day    Hydrochlorothiazide 25 Mg Tabs (Hydrochlorothiazide) .Marland Kitchen... Take 1 tablet by mouth once a day  BP today: 114/68 Prior BP: 99/69 (10/28/2009)  Labs Reviewed: K+: 4.8 (09/21/2009) Creat: : 0.90 (09/21/2009)   Chol: 156 (10/09/2009)   HDL: 47 (10/09/2009)   LDL: 79 (10/09/2009)   TG: 150 (10/09/2009)  Problem # 3:  ANKLE PAIN, LEFT (ICD-719.47) Assessment: New unclear etiology -? gout vs superficial inflammation 2/2 an insect bite. No signs of infection. will treat with NSAIDS and ice packs on as needed basis and will f/u in 7 days. Paitent was instructed to call with any concerns.  Complete Medication List: 1)  Lantus 100 Unit/ml Soln (Insulin glargine) .Marland KitchenMarland KitchenMarland Kitchen  Inject 22 units once at night 2)   Metformin Hcl 1000 Mg Tabs (Metformin hcl) .... Take 1 tablet by mouth two times a day 3)  Lisinopril 20 Mg Tabs (Lisinopril) .... Take 1 tablet by mouth once a day 4)  Neurontin 600 Mg Tabs (Gabapentin) .... Take 1 tablet by mouth three times a day 5)  Pravachol 40 Mg Tabs (Pravastatin sodium) .... Take 1 tablet by mouth once a day 6)  Ferrous Sulfate 324 Mg Tbec (Ferrous sulfate) .... Take 1 tablet by mouth once daily 7)  Prodigy Autocode Blood Glucose Strp (Glucose blood) .... To test blood sugar 3x daily 8)  Cvs Omeprazole 20 Mg Tbec (Omeprazole) .... Take 1 tablet by mouth two times a day 9)  Prodigy Twist Top Lancets 28g Misc (Lancets) .... Use to test blood sugar 3x a day 10)  Prodigy Autocode Blood Glucose Devi (Blood glucose monitoring suppl) .... Use to check blood sugar 3x daily 11)  Prodigy Insulin Syringe 31g X 5/16" 0.3 Ml Misc (Insulin syringe-needle u-100) .... Use to inject your lantus once daily- 250.00 12)  Tramadol Hcl 50 Mg Tabs (Tramadol hcl) .Marland Kitchen.. 1 tab by mouth q 6 hrs as needed for severe pain 13)  Amitriptyline Hcl 25 Mg Tabs (Amitriptyline hcl) .... One tab by mouth qhs 14)  Novolog Flexpen 100 Unit/ml Soln (Insulin aspart) .... Inject 3 units 10 minutes before breakfast, 2 units 10 minutes before lunch and 5 units 10 minutes before dinner 15)  Prodigy Mini Pen Needles 31g X 5 Mm Misc (Insulin pen needle) .... Use to inject insulin 3 times daily 16)  Naprosyn 500 Mg Tabs (Naproxen) .... Take one tablet by mouth two times a day as needed with meals for ankle pain and swelling  Other Orders: Gastroenterology Referral (GI) Influenza Vaccine MCR (56213) Pneumococcal Vaccine (08657) Admin 1st Vaccine (84696) Mammogram (Screening) (Mammo)  Patient Instructions: 1)  Please, increase Lantus to 22 Units before bedtime. 2)  Do not skip any meals. 3)  Plese, follow up with your foot doctor. 4)  Please, stop taking Hydrochlorothiazide due to low blood pressure. 5)  Please,  return to clinic in 7 days for BP recheck. 6)  Merry christmans!!!! Prescriptions: NAPROSYN 500 MG TABS (NAPROXEN) Take one tablet by mouth two times a day as needed with meals for ankle pain and swelling  #60 x 0   Entered and Authorized by:   Deatra Robinson MD   Signed by:   Deatra Robinson MD on 06/10/2010   Method used:   Electronically to        [facility] (retail)       [address]       [place], [place]  [address]       Ph: [phone]       Fax: [phone]   RxID:   [id number]    Orders Added: 1)  T- Capillary Blood Glucose [82948] 2)  T-Hgb A1C (in-house) [29518AC] 3)  Gastroenterology Referral [GI] 4)  T-Urine Microalbumin w/creat. ratio [[id number]] 5)  Est. Patient Level III [16606] 6)  Influenza Vaccine MCR [00025] 7)  Pneumococcal Vaccine [90732] 8)  Admin 1st Vaccine [90471] 9)  Mammogram (Screening) [Mammo]   Immunizations Administered:  Influenza Vaccine # 1:    Vaccine Type: Fluvax MCR    Site: left deltoid    Mfr: GlaxoSmithKline    Dose: 0.5 ml    Route: IM    Given  by: Chinita Pester RN    Exp. Date: 12/25/2010    Lot #: [id number]    VIS given: 01/19/10 version given June 10, 2010.  Pneumonia Vaccine:    Vaccine Type: Pneumovax (Medicare)    Site: right deltoid    Mfr: Merck    Dose: 0.5 ml    Route: IM    Given by: Chinita Pester RN    Exp. Date: 08/14/2011    Lot #: [id number]    VIS given: 06/01/09 version given June 10, 2010.  Flu Vaccine Consent Questions:    Do you have a history of severe allergic reactions to this vaccine? no    Any prior history of allergic reactions to egg and/or gelatin? no    Do you have a sensitivity to the preservative Thimersol? no    Do you have a past history of Guillan-Barre Syndrome? no    Do you currently have an acute febrile illness? no    Have you ever had a severe reaction to latex? no    Vaccine information given and explained to patient? yes     Are you currently pregnant? no   Immunizations Administered:  Influenza Vaccine # 1:    Vaccine Type: Fluvax MCR    Site: left deltoid    Mfr: GlaxoSmithKline    Dose: 0.5 ml    Route: IM    Given by: Chinita Pester RN    Exp. Date: 12/25/2010    Lot #: [id number]    VIS given: 01/19/10 version given June 10, 2010.  Pneumonia Vaccine:    Vaccine Type: Pneumovax (Medicare)    Site: right deltoid    Mfr: Merck    Dose: 0.5 ml    Route: IM    Given by: Chinita Pester RN    Exp. Date: 08/14/2011    Lot #: [id number]    VIS given: 06/01/09 version given June 10, 2010.  Prevention & Chronic Care Immunizations   Influenza vaccine: Fluvax MCR  (06/10/2010)   Influenza vaccine due: 02/26/2011    Tetanus booster: 01/23/2009: Td   Td booster deferral: Not indicated  (06/10/2010)   Tetanus booster due: 01/24/2019    Pneumococcal vaccine: Pneumovax (Medicare)  (06/10/2010)   Pneumococcal vaccine due: 12/29/2023  Colorectal Screening   Hemoccult: Not documented   Hemoccult action/deferral: Not indicated  (10/28/2009)    Colonoscopy: Not documented   Colonoscopy action/deferral: GI referral  (06/10/2010)  Other Screening   Pap smear: NEGATIVE FOR INTRAEPITHELIAL LESIONS OR MALIGNANCY.  (02/22/2008)   Pap smear action/deferral: Deferred-3 yr interval  (10/28/2009)    Mammogram: Not documented   Mammogram action/deferral: Deferred-2 yr interval  (10/28/2009)  Reports requested:   Last mammogram report requested.  Smoking status: quit > 6 months  (06/10/2010)  Diabetes Mellitus   HgbA1C: 8.5  (06/10/2010)   Hemoglobin A1C due: 09/09/2010    Eye exam: Proliferative diabetic retinopathy.  OD Glaucoma.  OU   (10/15/2009)   Diabetic eye exam action/deferral: Not indicated  (10/28/2009)   Eye exam due: 10/16/2010    Foot exam: yes  (06/10/2010)   Foot exam action/deferral: Do today   High risk foot: No  (10/28/2009)   Foot care education: Done  (10/28/2009)   Foot  exam due: 03/14/2008    Urine microalbumin/creatinine ratio: 12.3  (09/29/2008)   Urine microalbumin action/deferral: Ordered   Urine microalbumin/cr due: 06/11/2011    Diabetes flowsheet reviewed?: Yes   Progress toward A1C goal: Unchanged    Stage of readiness to  change (diabetes management): Action  Lipids   Total Cholesterol: 156  (10/09/2009)   LDL: 79  (10/09/2009)   LDL Direct: Not documented   HDL: 47  (10/09/2009)   Triglycerides: 150  (10/09/2009)   Lipid panel due: 10/10/2010    SGOT (AST): 14  (09/21/2009)   SGPT (ALT): 12  (09/21/2009)   Alkaline phosphatase: 60  (09/21/2009)   Total bilirubin: 0.2  (09/21/2009)   Liver panel due: 09/22/2010    Lipid flowsheet reviewed?: Yes   Progress toward LDL goal: Unchanged    Stage of readiness to change (lipid management): Action  Hypertension   Last Blood Pressure: 114 / 68  (06/10/2010)   Serum creatinine: 0.90  (09/21/2009)   Serum potassium 4.8  (09/21/2009)   Basic metabolic panel due: 09/22/2010   Progress toward BP goal: Unchanged    Stage of readiness to change (hypertension management): Maintenance  Self-Management Support :   Personal Goals (by the next clinic visit) :     Personal A1C goal: 7  (07/01/2009)     Personal blood pressure goal: 130/80  (07/01/2009)     Personal LDL goal: 70  (07/01/2009)    Patient will work on the following items until the next clinic visit to reach self-care goals:     Medications and monitoring: take my medicines every day, bring all of my medications to every visit, examine my feet every day  (06/10/2010)     Eating: drink diet soda or water instead of juice or soda, eat more vegetables, use fresh or frozen vegetables, eat foods that are low in salt, eat baked foods instead of fried foods, eat fruit for snacks and desserts  (06/10/2010)     Activity: take a 30 minute walk every day  (06/10/2010)    Diabetes self-management support: Written self-care plan  (06/10/2010)    Diabetes care plan printed   Last diabetes self-management training by diabetes educator: 10/28/2009   Last medical nutrition therapy: 02/19/2007    Hypertension self-management support: Written self-care plan  (06/10/2010)   Hypertension self-care plan printed.    Lipid self-management support: Written self-care plan  (06/10/2010)   Lipid self-care plan printed.   Nursing Instructions: Give Flu vaccine today Give Pneumovax today GI referral for screening colonoscopy (see order) Request report of last mammogram Diabetic foot exam today   Process Orders Check Orders Results:     [facility]: Check successful Tests Sent for requisitioning (June 10, 2010 4:06 PM):     06/10/2010: [facility] -- T-Urine Microalbumin w/creat. ratio [[id number]] (signed)     Laboratory Results   Blood Tests   Date/Time Received: June 10, 2010 2:24 PM  Date/Time Reported: Burke Keels  June 10, 2010 2:24 PM   HGBA1C: 8.5%   (Normal Range: Non-Diabetic - 3-6%   Control Diabetic - 6-8%) CBG Random:: 93mg /dL

## 2010-07-29 NOTE — Assessment & Plan Note (Signed)
Summary: SHOULDER PAIN,POSSIBLE INJECTION,MC   Vital Signs:  Patient profile:   50 year old female Height:      68 inches Weight:      254.2 pounds Pulse rate:   98 / minute BP sitting:   146 / 86  (right arm)  Vitals Entered By: Rochele Pages RN (July 14, 2010 11:14 AM) CC: bilat post shoulder pain   Referring Provider:  Lowella Bandy, MD Primary Provider:  [name], MD  CC:  bilat post shoulder pain.  History of Present Illness: 50 yo F h/o poorly controlled DM II (last A1C 8.5%) and h/o Lt adhesive capsulitis s/p 2-3 CSI in past, last one 11/10.  Over last 1.5 months, Lt shoulder pain has increased and Rt shoulder has begun to hurt as well.  Pain worst with lying on either side.  ROM ok, but particularly decreased with reaching back. Interested in injection today. Still trying to do some ROM exercises. Prev did oupt PT.  Preventive Screening-Counseling & Management  Alcohol-Tobacco     Smoking Status: quit     Year Quit: 2009  Allergies: No Known Drug Allergies  Social History: Smoking Status:  quit  Physical Exam  General:  overweight-appearing.   Msk:  LT shoulder: mild ant ttp.  ROM f flex 150 deg, abd 150 deg, ER 25 deg, IR to sacrum.  Nl RC strength.  + neer's and hawkin's.  No AC ttp, neg cross add. + abd/ER AC test  Rt shoulder: ROM f flex 150 deg, abd 150 deg, ER 40 deg, IR sacrum.  Nl RC strength. + impingement tests. + abd/ER [name] test   Impression & Recommendations:  Problem # 1:  ADHESIVE CAPSULITIS, LEFT (ICD-726.0)  - probably from uncontrolled A1C, now becoming somewhat chronic in nature with flares based on A1c  repeat injection today  sugar control  start basic ROM exercises (pendulum, wall crawls, walk outs)  f/u 4-6 weeks  Consent obtained and verified. Sterile betadine prep. Furthur cleansed with alcohol. Topical analgesic spray: Ethyl chloride. Joint: Lt GH joint Approached in typical fashion with: posterior approach  aimed at coracoid Completed without difficulty Meds: 1 cc 40 mg kenalog and 7 cc 1% lidocaine Needle: 21 G 1.5 inch Aftercare instructions and Red flags advised.  Orders: Joint Aspirate / Injection, Large (20610) Kenalog 10 mg inj (J3301)  Problem # 2:  ADHESIVE CAPSULITIS, RIGHT (ICD-726.0)  GH injection today  ROM exercises  F/u 4-6 weeks  Consent obtained and verified. Sterile betadine prep. Furthur cleansed with alcohol. Topical analgesic spray: Ethyl chloride. Joint:Rt GH joint Approached in typical fashion with: posterior aimed at coracoid Completed without difficulty Meds: 1 cc 40 mg kenalog and 7 cc 1 % lidocaine Needle: 21 G 1.5 inch Aftercare instructions and Red flags advised.  Orders: Joint Aspirate / Injection, Large (20610) Kenalog 10 mg inj (J3301)  Complete Medication List: 1)  Lantus 100 Unit/ml Soln (Insulin glargine) .... Inject 22 units once at night 2)  Metformin Hcl 1000 Mg Tabs (Metformin hcl) .... Take 1 tablet by mouth two times a day 3)  Lisinopril 20 Mg Tabs (Lisinopril) .... Take 1 tablet by mouth once a day 4)  Neurontin 600 Mg Tabs (Gabapentin) .... Take 1 tablet by mouth three times a day 5)  Pravachol 40 Mg Tabs (Pravastatin sodium) .... Take 1 tablet by mouth once a day 6)  Ferrous Sulfate 324 Mg Tbec (Ferrous sulfate) .... Take 1 tablet by mouth once daily 7)  Prodigy Autocode Blood  Glucose Strp (Glucose blood) .... To test blood sugar 3x daily 8)  Cvs Omeprazole 20 Mg Tbec (Omeprazole) .... Take 1 tablet by mouth two times a day 9)  Prodigy Twist Top Lancets 28g Misc (Lancets) .... Use to test blood sugar 3x a day 10)  Prodigy Autocode Blood Glucose Devi (Blood glucose monitoring suppl) .... Use to check blood sugar 3x daily 11)  Prodigy Insulin Syringe 31g X 5/16" 0.3 Ml Misc (Insulin syringe-needle u-100) .... Use to inject your lantus once daily- 250.00 12)  Tramadol Hcl 50 Mg Tabs (Tramadol hcl) .Marland Kitchen.. 1 tab by mouth q 6 hrs as needed  for severe pain 13)  Amitriptyline Hcl 25 Mg Tabs (Amitriptyline hcl) .... One tab by mouth qhs 14)  Novolog Flexpen 100 Unit/ml Soln (Insulin aspart) .... Inject 3 units 10 minutes before breakfast, 2 units 10 minutes before lunch and 5 units 10 minutes before dinner 15)  Prodigy Mini Pen Needles 31g X 5 Mm Misc (Insulin pen needle) .... Use to inject insulin 3 times daily   Orders Added: 1)  Est. Patient Level III [78295] 2)  Joint Aspirate / Injection, Large [20610] 3)  Kenalog 10 mg inj [J3301]

## 2010-07-29 NOTE — Letter (Signed)
Summary: Pre Visit Letter Revised  [facility]  [address] [place], [place] [address]   Phone: [phone]  Fax: [phone]        06/23/2010 MRN: [id number] [name] [address], [place]  [address]             Procedure Date: August 03, 2010   Welcome to the Gastroenterology Division at [facility].    You are scheduled to see a nurse for your pre-procedure visit on July 20, 2010 at 10:30am on the 3rd floor at [facility], [address]  We ask that you try to arrive at our office 15 minutes prior to your appointment time to allow for check-in.  Please take a minute to review the attached form.  If you answer "Yes" to one or more of the questions on the first page, we ask that you call the person listed at your earliest opportunity.  If you answer "No" to all of the questions, please complete the rest of the form and bring it to your appointment.    Your nurse visit will consist of discussing your medical and surgical history, your immediate family medical history, and your medications.   If you are unable to list all of your medications on the form, please bring the medication bottles to your appointment and we will list them.  We will need to be aware of both prescribed and over the counter drugs.  We will need to know exact dosage information as well.    Please be prepared to read and sign documents such as consent forms, a financial agreement, and acknowledgement forms.  If necessary, and with your consent, a friend or relative is welcome to sit-in on the nurse visit with you.  Please bring your insurance card so that we may make a copy of it.  If your insurance requires a referral to see a specialist, please bring your referral form from your primary care physician.  No co-pay is required for this nurse visit.     If you cannot keep your appointment, please call [phone] to cancel or reschedule prior to your appointment  date.  This allows [place] the opportunity to schedule an appointment for another patient in need of care.    Thank you for choosing  Gastroenterology for your medical needs.  We appreciate the opportunity to care for you.  Please visit [place] at our website  to learn more about our practice.  Sincerely, The Gastroenterology Division

## 2010-07-29 NOTE — Letter (Signed)
Summary: Pre Visit No Show Letter  [facility]  [address] [place], [place] [address]   Phone: [phone]  Fax: [phone]        July 21, 2010 MRN: [id number]    [facility] [address] [place], [place]  [address]    Dear Ms. [facility],   We have been unable to reach you by phone concerning the pre-procedure visit that you missed on Wednesday 07/21/10. For this reason,your procedure scheduled on Tuesday 08/03/10 with Dr. Arlyce Dice has been cancelled. Our scheduling staff will gladly assist you with rescheduling your appointments at a more convenient time. Please call our office at [phone] between the hours of 8:00am and 5:00pm, press option #2 to reach an appointment scheduler. Please consider updating your contact numbers at this time so that we can reach you by phone in the future with schedule changes or results.    Thank you,    Doristine Church RN [facility]

## 2010-07-29 NOTE — Letter (Signed)
Summary: [facility]   Imported By: Shon Hough 07/09/2010 14:29:20  _____________________________________________________________________  External Attachment:    Type:   Image     Comment:   External Document

## 2010-07-30 NOTE — Letter (Signed)
[facility]   Imported By: Margie Billet 10/29/2009 12:31:29  _____________________________________________________________________  External Attachment:    Type:   Image     Comment:   External Document  Appended Document: [facility]   Diabetic Eye Exam  Procedure date:  10/15/2009  Findings:      Proliferative diabetic retinopathy.  OD Glaucoma.  [facility]    Diabetic Eye Exam  Procedure date:  10/15/2009  Findings:      Proliferative diabetic retinopathy.  OD Glaucoma.  OU

## 2010-08-03 ENCOUNTER — Other Ambulatory Visit: Payer: Self-pay | Admitting: *Deleted

## 2010-08-03 ENCOUNTER — Other Ambulatory Visit: Payer: Self-pay | Admitting: Gastroenterology

## 2010-08-04 ENCOUNTER — Telehealth: Payer: Self-pay | Admitting: Internal Medicine

## 2010-08-04 ENCOUNTER — Other Ambulatory Visit: Payer: Self-pay | Admitting: Internal Medicine

## 2010-08-04 DIAGNOSIS — I10 Essential (primary) hypertension: Secondary | ICD-10-CM

## 2010-08-04 MED ORDER — INSULIN PEN NEEDLE 31G X 5 MM MISC: Status: DC

## 2010-08-04 MED ORDER — TRAMADOL HCL 50 MG PO TABS: 50.0000 mg | ORAL_TABLET | Freq: Four times a day (QID) | ORAL | Status: AC | PRN

## 2010-08-04 MED ORDER — METFORMIN HCL 1000 MG PO TABS: 1000.0000 mg | ORAL_TABLET | Freq: Two times a day (BID) | ORAL | Status: DC

## 2010-08-04 MED ORDER — LISINOPRIL 20 MG PO TABS: 20.0000 mg | ORAL_TABLET | Freq: Every day | ORAL | Status: DC

## 2010-08-04 MED ORDER — GABAPENTIN 600 MG PO TABS: 600.0000 mg | ORAL_TABLET | Freq: Three times a day (TID) | ORAL | Status: DC

## 2010-08-04 MED ORDER — INSULIN ASPART 100 UNIT/ML ~~LOC~~ SOLN: SUBCUTANEOUS | Status: DC

## 2010-08-04 MED ORDER — INSULIN GLARGINE 100 UNIT/ML ~~LOC~~ SOLN: 22.0000 [IU] | Freq: Every day | SUBCUTANEOUS | Status: DC

## 2010-08-04 NOTE — Telephone Encounter (Signed)
Patient needs visit and Bmet

## 2010-08-04 NOTE — Letter (Signed)
[facility]   Imported By: Margie Billet 07/14/2010 11:16:46  _____________________________________________________________________  External Attachment:    Type:   Image     Comment:   External Document

## 2010-08-11 ENCOUNTER — Ambulatory Visit: Payer: Self-pay | Admitting: Sports Medicine

## 2010-09-06 LAB — GLUCOSE, CAPILLARY: Glucose-Capillary: 162 mg/dL — ABNORMAL HIGH (ref 70–99)

## 2010-09-08 LAB — COMPREHENSIVE METABOLIC PANEL
ALT: 16 U/L (ref 0–35)
Albumin: 3.9 g/dL (ref 3.5–5.2)
Alkaline Phosphatase: 62 U/L (ref 39–117)
Potassium: 4.9 mEq/L (ref 3.5–5.1)
Sodium: 137 mEq/L (ref 135–145)
Total Protein: 7.3 g/dL (ref 6.0–8.3)

## 2010-09-08 LAB — CBC
Platelets: 269 10*3/uL (ref 150–400)
RDW: 14 % (ref 11.5–15.5)
WBC: 13.2 10*3/uL — ABNORMAL HIGH (ref 4.0–10.5)

## 2010-09-08 LAB — POCT CARDIAC MARKERS: Myoglobin, poc: 64.1 ng/mL (ref 12–200)

## 2010-09-10 ENCOUNTER — Encounter: Payer: Self-pay | Admitting: Internal Medicine

## 2010-09-10 ENCOUNTER — Ambulatory Visit (INDEPENDENT_AMBULATORY_CARE_PROVIDER_SITE_OTHER): Payer: Medicare Other | Admitting: Internal Medicine

## 2010-09-10 ENCOUNTER — Other Ambulatory Visit: Payer: Self-pay | Admitting: Internal Medicine

## 2010-09-10 DIAGNOSIS — K219 Gastro-esophageal reflux disease without esophagitis: Secondary | ICD-10-CM

## 2010-09-10 DIAGNOSIS — I10 Essential (primary) hypertension: Secondary | ICD-10-CM

## 2010-09-10 DIAGNOSIS — M7500 Adhesive capsulitis of unspecified shoulder: Secondary | ICD-10-CM

## 2010-09-10 DIAGNOSIS — E119 Type 2 diabetes mellitus without complications: Secondary | ICD-10-CM

## 2010-09-10 DIAGNOSIS — R1110 Vomiting, unspecified: Secondary | ICD-10-CM

## 2010-09-10 DIAGNOSIS — F329 Major depressive disorder, single episode, unspecified: Secondary | ICD-10-CM

## 2010-09-10 DIAGNOSIS — F32A Depression, unspecified: Secondary | ICD-10-CM

## 2010-09-10 DIAGNOSIS — E785 Hyperlipidemia, unspecified: Secondary | ICD-10-CM

## 2010-09-10 DIAGNOSIS — Z0000 Encounter for general adult medical examination without abnormal findings: Secondary | ICD-10-CM

## 2010-09-10 LAB — CBC WITH DIFFERENTIAL/PLATELET
HCT: 35.2 % — ABNORMAL LOW (ref 36.0–46.0)
Hemoglobin: 11.6 g/dL — ABNORMAL LOW (ref 12.0–15.0)
Lymphocytes Relative: 25 % (ref 12–46)
MCHC: 33 g/dL (ref 30.0–36.0)
MCV: 90 fL (ref 78.0–100.0)
Monocytes Absolute: 0.5 10*3/uL (ref 0.1–1.0)
Monocytes Relative: 4 % (ref 3–12)
Neutro Abs: 8 10*3/uL — ABNORMAL HIGH (ref 1.7–7.7)

## 2010-09-10 LAB — LIPID PANEL
Cholesterol: 157 mg/dL (ref 0–200)
Total CHOL/HDL Ratio: 2.9 Ratio
Triglycerides: 209 mg/dL — ABNORMAL HIGH (ref <150)
VLDL: 42 mg/dL — ABNORMAL HIGH (ref 0–40)

## 2010-09-10 LAB — COMPREHENSIVE METABOLIC PANEL
AST: 11 U/L (ref 0–37)
Alkaline Phosphatase: 66 U/L (ref 39–117)
BUN: 17 mg/dL (ref 6–23)
Creat: 1.04 mg/dL (ref 0.40–1.20)
Total Bilirubin: 0.3 mg/dL (ref 0.3–1.2)

## 2010-09-10 LAB — POCT GLYCOSYLATED HEMOGLOBIN (HGB A1C): Hemoglobin A1C: 8.8

## 2010-09-10 LAB — GLUCOSE, CAPILLARY: Glucose-Capillary: 375 mg/dL — ABNORMAL HIGH (ref 70–99)

## 2010-09-10 MED ORDER — LISINOPRIL 20 MG PO TABS: 20.0000 mg | ORAL_TABLET | Freq: Every day | ORAL | Status: DC

## 2010-09-10 MED ORDER — ONDANSETRON HCL 4 MG PO TABS: 4.0000 mg | ORAL_TABLET | Freq: Three times a day (TID) | ORAL | Status: DC | PRN

## 2010-09-10 MED ORDER — PRAVASTATIN SODIUM 40 MG PO TABS: 40.0000 mg | ORAL_TABLET | Freq: Every day | ORAL | Status: DC

## 2010-09-10 MED ORDER — GABAPENTIN 600 MG PO TABS: 600.0000 mg | ORAL_TABLET | Freq: Three times a day (TID) | ORAL | Status: DC

## 2010-09-10 MED ORDER — OMEPRAZOLE 20 MG PO CPDR: 20.0000 mg | DELAYED_RELEASE_CAPSULE | Freq: Every day | ORAL | Status: DC

## 2010-09-10 MED ORDER — INSULIN GLARGINE 100 UNIT/ML ~~LOC~~ SOLN: 25.0000 [IU] | Freq: Every day | SUBCUTANEOUS | Status: DC

## 2010-09-10 MED ORDER — AMITRIPTYLINE HCL 25 MG PO TABS: 25.0000 mg | ORAL_TABLET | Freq: Every day | ORAL | Status: DC

## 2010-09-10 MED ORDER — TRAMADOL HCL 50 MG PO TABS: 50.0000 mg | ORAL_TABLET | Freq: Two times a day (BID) | ORAL | Status: DC

## 2010-09-10 NOTE — Assessment & Plan Note (Signed)
Uncontrolled. Patient instructed to bring meter on follow up appointment for further evaluation and titration of regimen. Will increase Lantus to 25 units sq at bedtime, and to restart using mealtime coverage. Instructed to schedule Diabetic Eye exam.

## 2010-09-10 NOTE — Assessment & Plan Note (Signed)
Patient did not go to scheduled Colonoscopy. Patient was instructed to reschedule Colonoscopy. Patient prefers to get Pap smear by Gynecologist. Will refer and follow up.

## 2010-09-10 NOTE — Assessment & Plan Note (Signed)
Check lipid panel and cmet today.

## 2010-09-10 NOTE — Progress Notes (Signed)
  Subjective:    Patient ID: [name], female    DOB: [birth year], 50 y.o.   MRN: [id number]  HPI  50 yo woman with  Past Medical History  Diagnosis Date  . Diabetes mellitus   . Hypertension   . Hyperlipidemia   . GERD (gastroesophageal reflux disease)   . Adhesive capsulitis of shoulder     bilateral, Steroid injection Dr. Nedra Hai 1/12 bilaterally  . Peripheral neuropathy   . Adhesive capsulitis of right shoulder     Steroid injection Dr. Nedra Hai 1/12  . Adhesive capsulitis of left shoulder     Steroid injection Dr. Nedra Hai 1/12  . Obesity   . Diabetic retinopathy   . Glaucoma    comes to the clinic complaining of diarrhea for the last couple of weeks. Reports to be improving. Patient reports that she had been on antibiotics 4 weeks ago for toe infection. Associated with nausea, epigastric pain after vomiting. Denies fever/chills, hematochezia, melena, dysuria, coughing, chest pain, palpitation, diaphoresis, shortness of breath.   Diabetes: Did not bring meter. Blood glucose levels have been elevated recently. Denies any hypoglycemic episodes. Has not been using mealtime coverage in the last couple of weeks.   Shoulder pain: Patient saw Dr. Nedra Hai which injected both shoulders in January.  Review of Systems  All other systems reviewed and are negative.       Objective:   Physical Exam  Constitutional: She is oriented to person, place, and time. She appears well-developed and well-nourished. No distress.  HENT:  Mouth/Throat: Oropharynx is clear and moist.  Eyes: Conjunctivae and EOM are normal. Pupils are equal, round, and reactive to light.  Neck: Normal range of motion. Neck supple.  Cardiovascular: Normal rate, regular rhythm and normal heart sounds.   Pulmonary/Chest: Effort normal and breath sounds normal.  Abdominal: Soft. Bowel sounds are normal. She exhibits no distension. There is tenderness. There is no rebound and no guarding.  Neurological: She is alert and oriented to  person, place, and time.  Skin: She is not diaphoretic.  Psychiatric: She has a normal mood and affect.          Assessment & Plan:

## 2010-09-10 NOTE — Assessment & Plan Note (Signed)
Most likely gastroenteritis. Rule out pancreatitis, C. Diff colitis due to recent antibiotic use. Review labs and reassess. Supportive treatment.

## 2010-09-10 NOTE — Assessment & Plan Note (Signed)
Per Dr. Nedra Hai. Continue current pain regimen.

## 2010-09-10 NOTE — Assessment & Plan Note (Signed)
Controlled. Continue current regimen. 

## 2010-09-10 NOTE — Patient Instructions (Signed)
Make a follow up appointment in 1 month. Bring meter with you for next appointment. Schedule Colonoscopy. Take all medication as directed. You will be called with any abnormalities in the tests scheduled or performed today.  If you don't hear from [place] within a week from when the test was performed, you can assume that your test was normal.

## 2010-09-12 LAB — GLUCOSE, CAPILLARY: Glucose-Capillary: 119 mg/dL — ABNORMAL HIGH (ref 70–99)

## 2010-09-13 ENCOUNTER — Other Ambulatory Visit: Payer: Self-pay | Admitting: Internal Medicine

## 2010-09-13 ENCOUNTER — Telehealth: Payer: Self-pay | Admitting: *Deleted

## 2010-09-13 DIAGNOSIS — R1110 Vomiting, unspecified: Secondary | ICD-10-CM

## 2010-09-13 MED ORDER — PROMETHAZINE HCL 12.5 MG PO TABS: 12.5000 mg | ORAL_TABLET | Freq: Four times a day (QID) | ORAL | Status: DC | PRN

## 2010-09-13 NOTE — Telephone Encounter (Signed)
Pt and pharmacy have called, insurance refused zofran, can we change to phenergan? Please enter on her med record if it is changed

## 2010-09-13 NOTE — Assessment & Plan Note (Signed)
Will change zofran for phenergan 12.5 mg po q 8hrs because insurance does not cover zofran.

## 2010-09-19 LAB — POCT CARDIAC MARKERS

## 2010-09-19 LAB — POCT I-STAT, CHEM 8
BUN: 13 mg/dL (ref 6–23)
Calcium, Ion: 1.15 mmol/L (ref 1.12–1.32)
Chloride: 101 mEq/L (ref 96–112)
Creatinine, Ser: 0.9 mg/dL (ref 0.4–1.2)
Glucose, Bld: 204 mg/dL — ABNORMAL HIGH (ref 70–99)
TCO2: 29 mmol/L (ref 0–100)

## 2010-09-19 LAB — DIFFERENTIAL
Basophils Absolute: 0.1 10*3/uL (ref 0.0–0.1)
Eosinophils Absolute: 0.2 10*3/uL (ref 0.0–0.7)
Eosinophils Relative: 1 % (ref 0–5)
Neutrophils Relative %: 66 % (ref 43–77)

## 2010-09-19 LAB — URINE MICROSCOPIC-ADD ON

## 2010-09-19 LAB — URINALYSIS, ROUTINE W REFLEX MICROSCOPIC
Bilirubin Urine: NEGATIVE
Bilirubin Urine: NEGATIVE
Glucose, UA: 250 mg/dL — AB
Ketones, ur: NEGATIVE mg/dL
Leukocytes, UA: NEGATIVE
Nitrite: NEGATIVE
Protein, ur: NEGATIVE mg/dL
Specific Gravity, Urine: 1.023 (ref 1.005–1.030)
Urobilinogen, UA: 1 mg/dL (ref 0.0–1.0)
pH: 6.5 (ref 5.0–8.0)
pH: 7.5 (ref 5.0–8.0)

## 2010-09-19 LAB — CBC
HCT: 33.8 % — ABNORMAL LOW (ref 36.0–46.0)
Hemoglobin: 11.3 g/dL — ABNORMAL LOW (ref 12.0–15.0)
MCHC: 33.4 g/dL (ref 30.0–36.0)
RBC: 3.8 MIL/uL — ABNORMAL LOW (ref 3.87–5.11)
RDW: 14.8 % (ref 11.5–15.5)

## 2010-09-19 LAB — COMPREHENSIVE METABOLIC PANEL
ALT: 20 U/L (ref 0–35)
AST: 19 U/L (ref 0–37)
CO2: 27 mEq/L (ref 19–32)
Chloride: 102 mEq/L (ref 96–112)
Creatinine, Ser: 0.86 mg/dL (ref 0.4–1.2)
GFR calc Af Amer: >60 mL/min (ref >60)
GFR calc non Af Amer: >60 mL/min (ref >60)
Glucose, Bld: 206 mg/dL — ABNORMAL HIGH (ref 70–99)
Sodium: 136 mEq/L (ref 135–145)
Total Bilirubin: 0.4 mg/dL (ref 0.3–1.2)

## 2010-09-19 LAB — GLUCOSE, CAPILLARY: Glucose-Capillary: 258 mg/dL — ABNORMAL HIGH (ref 70–99)

## 2010-09-19 LAB — LIPASE, BLOOD: Lipase: 45 U/L (ref 11–59)

## 2010-10-04 LAB — COMPREHENSIVE METABOLIC PANEL
ALT: 15 U/L (ref 0–35)
BUN: 18 mg/dL (ref 6–23)
CO2: 24 mEq/L (ref 19–32)
Calcium: 9.3 mg/dL (ref 8.4–10.5)
Creatinine, Ser: 0.85 mg/dL (ref 0.4–1.2)
GFR calc non Af Amer: >60 mL/min (ref >60)
Glucose, Bld: 176 mg/dL — ABNORMAL HIGH (ref 70–99)
Sodium: 136 mEq/L (ref 135–145)

## 2010-10-04 LAB — CBC
HCT: 32.3 % — ABNORMAL LOW (ref 36.0–46.0)
Hemoglobin: 10.8 g/dL — ABNORMAL LOW (ref 12.0–15.0)
MCHC: 33.3 g/dL (ref 30.0–36.0)
MCV: 90.5 fL (ref 78.0–100.0)
RBC: 3.57 MIL/uL — ABNORMAL LOW (ref 3.87–5.11)

## 2010-10-04 LAB — GLUCOSE, CAPILLARY: Glucose-Capillary: 168 mg/dL — ABNORMAL HIGH (ref 70–99)

## 2010-10-04 LAB — POCT CARDIAC MARKERS
CKMB, poc: 1.4 ng/mL (ref 1.0–8.0)
Troponin i, poc: <0.05 ng/mL (ref 0.00–0.09)
Troponin i, poc: <0.05 ng/mL (ref 0.00–0.09)

## 2010-10-04 LAB — DIFFERENTIAL
Eosinophils Absolute: 0.1 10*3/uL (ref 0.0–0.7)
Lymphs Abs: 2.5 10*3/uL (ref 0.7–4.0)
Neutro Abs: 7.9 10*3/uL — ABNORMAL HIGH (ref 1.7–7.7)
Neutrophils Relative %: 71 % (ref 43–77)

## 2010-10-04 LAB — POCT I-STAT, CHEM 8
Calcium, Ion: 1.1 mmol/L — ABNORMAL LOW (ref 1.12–1.32)
Chloride: 104 mEq/L (ref 96–112)
HCT: 31 % — ABNORMAL LOW (ref 36.0–46.0)
TCO2: 25 mmol/L (ref 0–100)

## 2010-10-04 LAB — D-DIMER, QUANTITATIVE: D-Dimer, Quant: 0.29 ug/mL-FEU (ref 0.00–0.48)

## 2010-10-06 LAB — GLUCOSE, CAPILLARY: Glucose-Capillary: 219 mg/dL — ABNORMAL HIGH (ref 70–99)

## 2010-10-07 ENCOUNTER — Telehealth: Payer: Self-pay | Admitting: *Deleted

## 2010-10-11 ENCOUNTER — Telehealth: Payer: Self-pay | Admitting: Dietician

## 2010-10-11 ENCOUNTER — Ambulatory Visit: Payer: Medicare Other | Admitting: Internal Medicine

## 2010-10-11 NOTE — Telephone Encounter (Signed)
Called patient about missed appointment and to discuss blood sugar control. She had surgery Friday - had bone removed from her toe. She indicates blood sugars are not too well controlled. Suggested correction insulin and reschedule her missed appointment and CDE appointment same day in the next week. Patient agreed  Will route this note to front office poll to call and reschedule.

## 2010-10-12 LAB — DIFFERENTIAL
Basophils Absolute: 0 10*3/uL (ref 0.0–0.1)
Basophils Relative: 0 % (ref 0–1)
Eosinophils Relative: 1 % (ref 0–5)
Monocytes Absolute: 0.3 10*3/uL (ref 0.1–1.0)
Neutro Abs: 8.2 10*3/uL — ABNORMAL HIGH (ref 1.7–7.7)

## 2010-10-12 LAB — CBC
Hemoglobin: 11 g/dL — ABNORMAL LOW (ref 12.0–15.0)
MCHC: 34.3 g/dL (ref 30.0–36.0)
RDW: 14.9 % (ref 11.5–15.5)

## 2010-10-12 LAB — BASIC METABOLIC PANEL
Calcium: 9.2 mg/dL (ref 8.4–10.5)
Creatinine, Ser: 0.95 mg/dL (ref 0.4–1.2)
GFR calc Af Amer: >60 mL/min (ref >60)

## 2010-10-12 LAB — RAPID URINE DRUG SCREEN, HOSP PERFORMED
Amphetamines: NOT DETECTED
Cocaine: NOT DETECTED
Opiates: POSITIVE — AB
Tetrahydrocannabinol: NOT DETECTED

## 2010-10-21 ENCOUNTER — Encounter: Payer: Medicare Other | Admitting: Family

## 2010-10-27 NOTE — Telephone Encounter (Signed)
Addended by: Alric Quan on: 10/27/2010 04:28 PM   Modules accepted: Orders

## 2010-10-29 ENCOUNTER — Encounter: Payer: Self-pay | Admitting: Internal Medicine

## 2010-10-29 ENCOUNTER — Encounter: Payer: Medicare Other | Admitting: Internal Medicine

## 2010-10-29 NOTE — Progress Notes (Signed)
ENCOUNTER OPENED IN ERROR

## 2010-11-01 NOTE — Progress Notes (Signed)
This encounter was created in error - please disregard.

## 2010-11-08 ENCOUNTER — Ambulatory Visit: Payer: Medicare Other | Admitting: Dietician

## 2010-11-08 NOTE — Telephone Encounter (Signed)
Opened in error

## 2010-11-09 NOTE — Op Note (Signed)
[name], [name]              ACCOUNT NO.:  [id number]   MEDICAL RECORD NO.:  [id number]          PATIENT TYPE:  AMB   LOCATION:  SDS                          FACILITY:  [facility]   PHYSICIAN:  Jillyn Hidden A. Rankin, M.D.   DATE OF BIRTH:  1961/05/12   DATE OF PROCEDURE:  02/05/2007  DATE OF DISCHARGE:                               OPERATIVE REPORT   PREOPERATIVE DIAGNOSES:  1. Dense vitreous hemorrhage, left eye.  2. Aggressive proliferative diabetic retinopathy left eye.   POSTOPERATIVE DIAGNOSES:  1. Dense vitreous hemorrhage, left eye.  2. Aggressive proliferative diabetic retinopathy left eye.  3. Epiretinal membrane and internal limiting membrane pucker over the      macular region.  4. Chronic cystoid macular edema.   PROCEDURE:  1. Posterior vitrectomy with membrane peel, internal limiting membrane      and the overlying epiretinal membrane, 25-gauge.  2. Endolaser pan-photocoagulation left eye.  3. Injection of vitreous substitute, silicone oil 5000 centistokes to      prevent __________  recurring vitreous hemorrhage.   The patient understands this is an attempt to clear the ocular vitreous  opacification which limits her actual vision.  She understands that this  is an attempt also to salvage the globe for an eye that had been  previously threatened with neovascular glaucoma now controlled after  vitrectomy and placement of silicone __________  in the anterior  chamber.  The patient understands the indication for intervention.  He  understands the risk of anesthesia including occurrence of death, also  the risk to the eye from the underlying condition and surgical repair  including but not limited to hemorrhage, infection, scarring, need for  further surgery, no change in vision, loss of vision and progressive  disease despite intervention.  Appropriate signed consent was obtained.  The patient was taken to the operating room.   In the operating room appropriate  monitoring was followed by mild  sedation.  General endotracheal anesthesia was instituted without  difficulty.  The left periocular region was sterilely prepped and draped  in the usual ophthalmic fashion.  Lid speculum applied.  A 25-gauge  trocar placed in the inferotemporal quadrant.  Superior trocar was  applied.  Core vitrectomy had been previously done.  Vitreous aspiration  carried out to clear the vitreous hemorrhage.  Scleral pressure was then  used and the vitreous base saved inferiorly.  No neovascularization of  the anterior hyaloidal area was discovered.  Endolaser photocoagulation  was then placed in this area.  Concentric treatment was then carried out  posteriorly as well as around the retinal periphery.   At this time, 25-gauge forceps was then used to engage epiretinal  membrane which was removed as a continuous sheath.  Underlying macular  topographic distortion from the internal epiretinal membrane was still  recognized with striae and this was engaged as a continuous sheet off  the macular region.  Underlying CME was documented.  At this time the  fluid/air exchange was then completed.  An air-silicone oil exchange was  completed passively.  This was done through a superotemporal sclerotomy  which  had been enlarged with an open conjunctiva with an MVR  blade.  At this time the sclerotomy was closed with 7-0 Vicryl.  The  conjunctiva closed with 7-0 Vicryl  with copious irrigation to remove  the excess silicone.  The infusion was removed.  At this time  subconjunctival Decadron was applied.  Sterile Fox patch and shield  applied.  The patient tolerated the procedure without complication.      [name], M.D.  Electronically Signed     GAR/MEDQ  D:  02/05/2007  T:  02/05/2007  Job:  [id number]

## 2010-11-09 NOTE — Op Note (Signed)
[name], [name]              ACCOUNT NO.:  [id number]   MEDICAL RECORD NO.:  [id number]          PATIENT TYPE:  AMB   LOCATION:  SDS                          FACILITY:  [facility]   PHYSICIAN:  Jillyn Hidden A. Rankin, M.D.   DATE OF BIRTH:  06/18/1961   DATE OF PROCEDURE:  DATE OF DISCHARGE:                               OPERATIVE REPORT   PREOPERATIVE DIAGNOSES:  1. Retained silicone oil implant posterior - nonmagnetic foreign body,      left eye.  2. History of neovascular glaucoma.  3. History of vitreous hemorrhage, recurrent, secondary to      proliferative diabetic retinopathy.   PROCEDURE:  1. Posterior vitrectomy - 25 gauge, left eye.  2. Removal of posterior implant - nonmagnetic foreign body, left eye.   SURGEON:  Alford Highland. Rankin, MD.   ANESTHESIA:  Local as per my anesthesia control.   INDICATIONS OF PROCEDURE:  The patient is a 50 year old woman who has a  history of neovascular glaucoma status post vitrectomy status post laser  photocoagulation status post aqueous shunt implant in his left eye as  well as recurrent vitreous hemorrhages which required placement of  silicone oil to stabilize the globe in the eye.  The patient has had oil  in place since August 2008.  Her condition has remained stable.  She  wished to proceed with oil extraction knowing that there is a 10%-15%  chance of recurrence of retinal detachment and/or vitreous hemorrhage.  She understands the risk of anesthesia including occurrence of death,  loss of the eye including but not limited to hemorrhage, infection,  scarring, need for another surgery, no change in vision, loss of vision,  and progressive disease despite intervention.   After appropriate signed consent was obtained, she was taken to the  operating room.  In the operating room, appropriate monitors followed by  mild sedation.  A 2% Xylocaine injected to 5 mL retrobulbar and  additional 5 mL laterally in the fashion of modified Darel Hong.   Left  periocular region was sterilely prepped and draped in the usual  ophthalmic fashion.  Lid speculum was applied.  A 25-gauge trocar placed  in position.  Temporal trocar applied.  Superonasal sclerotomy cutdown  was then fashioned with a 20-gauge MVR blade.  Oil extraction was  carried out with an 18-gauge extrusion needle on the extraction unit.  Oil was removed without difficulty.  No complications occurred.  Vitrectomy instrumentation was then placed to confirm the retina was  reattached nicely and had remained attached.  Good excellent laser  photocoagulation was then placed.  No hemorrhages occurred.  Small  remnants of oil were aspirated.   At this time, the superonasal sclerotomy closed with 7-0 Vicryl.  The  conjunctiva was closed as well.  The remaining trocars were removed.  Subconjunctival Decadron applied.  Sterile patch and Fox shield applied.  The patient tolerated the procedure well without complications.      [name], M.D.  Electronically Signed     GAR/MEDQ  D:  10/29/2007  T:  10/30/2007  Job:  [id number]

## 2010-11-12 ENCOUNTER — Other Ambulatory Visit (INDEPENDENT_AMBULATORY_CARE_PROVIDER_SITE_OTHER): Payer: Medicare Other | Admitting: *Deleted

## 2010-11-12 DIAGNOSIS — F32A Depression, unspecified: Secondary | ICD-10-CM

## 2010-11-12 DIAGNOSIS — F329 Major depressive disorder, single episode, unspecified: Secondary | ICD-10-CM

## 2010-11-12 DIAGNOSIS — M7500 Adhesive capsulitis of unspecified shoulder: Secondary | ICD-10-CM

## 2010-11-12 MED ORDER — TRAMADOL HCL 50 MG PO TABS: 50.0000 mg | ORAL_TABLET | Freq: Four times a day (QID) | ORAL | Status: DC | PRN

## 2010-11-12 NOTE — Op Note (Signed)
[name], [name]              ACCOUNT NO.:  [id number]   MEDICAL RECORD NO.:  [id number]          PATIENT TYPE:  AMB   LOCATION:  SDS                          FACILITY:  [facility]   PHYSICIAN:  Jillyn Hidden A. Rankin, M.D.   DATE OF BIRTH:  1961-06-22   DATE OF PROCEDURE:  05/08/2006  DATE OF DISCHARGE:  05/08/2006                                 OPERATIVE REPORT   PREOPERATIVE DIAGNOSES:  1. Neovascular glaucoma, left eye.  2. Progressive proliferative diabetic retinopathy, left eye.   POSTOPERATIVE DIAGNOSES:  1. Neovascular glaucoma, left eye.  2. Progressive proliferative diabetic retinopathy, left eye.   PROCEDURE:  Insertion of aqueous shunt-glaucoma seton, Baerveldt anterior  chamber model #BG101-350, into the superior nasal quadrant of the left eye.   SURGEON:  Alford Highland. Rankin, M.D.   ANESTHESIA:  General endotracheal anesthesia.   INDICATIONS FOR PROCEDURE:  The patient is a 50 year old woman, who has  advanced diabetic retinopathy, largely neglected, who has had  panphotocoagulation on 2 occasions, but nonetheless has developed permanent  increased intraocular pressure, threatening profound vision loss despite  maximal medically tolerated therapy.  The patient understands this is an  attempt to surgically reduce the pressure.  She understands the risks of  anesthesia, including the rare occurrence of death, loss of the eye, and  including but not limited to hemorrhage, infection, scarring, need for  further surgery, no change in vision, loss of vision, progression of disease  despite intervention and need for another surgery.   DESCRIPTION OF PROCEDURE:  Appropriate signed consent was obtained.  She was  taken to the operating room.  In the operating room, appropriate monitoring  was followed by general endotracheal anesthesia.  The left periocular region  was sterilely prepped and draped in the usual ophthalmic fashion.  A lid  speculum was applied.  Conjunctival peritomy  was then fashioned in the  superior nasal quadrant, and the medial and the superior rectus muscles were  isolated on 2-0 silk ties.   The quadrant was then entered.  An anterior chamber maintainer, Lewickey,  was inserted in the inferotemporal quadrant to maintain the intraocular  pressure.  This was done without difficulty.  It maintained intraocular  pressure at 25 mmHg.   At this time, the implant was placed under each of the rectus muscles  isolated previously, placed in the superior nasal quadrant.  The footplate  was secured with 8-0 nylon sutures.  The tube was brought forward, and it  was secured to prevent it from retracting from the anterior chamber.  The  anterior chamber was opened with a 20-gauge MVR blade.  The tube was cut to  the appropriate length and was placed in the anterior chamber.  A partially  occlusive 7-0 Vicryl was applied to the tube to limit flow through the tube.  Intraocular pressure was found to flow readily through the tube at pressures  above 30.   At this time, the Tutoplast was then secured, overlying the tube as it  entered the anterior chamber.  Thereafter, Tenon's and conjunctiva were then  closed in layers overlying the Tutoplast  so as to provide adequate coverage.  The conjunctiva was then closed.  Subconjunctival injection of Decadron was  applied.  A fill of anterior chamber was then placed to limit the flow  through the tube further over the next 1 to 2 days.   At this time, the patient was awakened from anesthesia without difficulty.  A sterile patch and a Fox shield were applied.  The patient was taken to the  recovery room in good and stable condition.      [name], M.D.  Electronically Signed     GAR/MEDQ  D:  05/08/2006  T:  05/09/2006  Job:  [id number]

## 2010-11-12 NOTE — Discharge Summary (Signed)
[name], [name]              ACCOUNT NO.:  [id number]   MEDICAL RECORD NO.:  [id number]          PATIENT TYPE:  INP   LOCATION:  3731                         FACILITY:  [facility]   PHYSICIAN:  Carlus Pavlov, M.D. DATE OF BIRTH:  [birth year]   DATE OF ADMISSION:  01/17/2006  DATE OF DISCHARGE:  01/19/2006                                 DISCHARGE SUMMARY   DISCHARGE DIAGNOSES:  1.  Chest pain, likely secondary to gastroesophageal reflux disease.  2.  Jaw pain secondary to first and second tooth abscesses.  3.  Abdominal pain, likely secondary to gastroparesis.  4.  Trichomonas vaginitis.  5.  Dizziness.  6.  Diabetes mellitus type 2.   CONDITION AT DISCHARGE:  Improved.   MEDICATION AT DISCHARGE:  Lisinopril 10 mg po q daily  Protonix 40 mg po BID  Glipizide 10 mg po q daily    The patient has an appointment with Dr. Melburn Popper on 01/20/06  The patient has an appointment with Dr. Elvera Lennox on 02/06/06  The patient will schedule an appointment with Dr. Kristin Bruins as an  appointment.   LABS:  There are no pending labs.   PROCEDURES:  The patient had chest x-ray at admission that showed no acute  cardiopulmonary disease and stable blunting of the left costophrenic angle.  She also had Panorex that showed widespread periodontal disease and possible  root abscesses in the region of number one and number two.  An ultrasound of  the abdomen on January 18, 2006 that showed normal abdominal ultrasound and a  normal gallbladder.   CONSULTATIONS:  Dr. Kristin Bruins, dentist  Dr. Elease Hashimoto, cardiologist   CHIEF COMPLAINT:  Chest pain.  For full details, please refer to the  patient's chart.  Briefly, the patient is a 50 year old African-American  woman with past medical history significant for hypertension, diabetes  mellitus diagnosed in 1985 and peripheral neuropathy presenting with chest  pain.  The chest pain is constant, left-sided, and radiates into the left  and right arms.  It is  substernal, 9 out of 10, pleuritic in nature,  associated with nausea and cough with white sputum but not diaphoresis or  vomiting.  The patient is also complaining of bilateral upper quadrant pain  9/10 without radiation, pressure-like.   PAST MEDICAL HISTORY:  1.  Chest pain with shortness of breath.  2.  Nausea, diarrhea, and abdominal pain.  3.  Cough with clear sputum.  4.  Hypertension.  5.  Diabetes mellitus type 2 with last hemoglobin A1c 12 on oral meds.  6.  Gastroesophageal reflux disease.  7.  Hyperlipidemia.  8.  Peripheral neuropathy.  9.  Trichomonas vaginitis.  10. History of chest pain with negative enzymes and negative Cardiolite in      2003.   VITAL SIGNS ON ADMISSION:  Temperature 97.2, blood pressure 134/80, pulse  79, respiration 20, oxygen saturation 100% on room air.   PHYSICAL EXAM:  Normal except abdomen was soft and was tender in the right  upper quadrant with palpitation in the left upper quadrant without rebound,  without guarding.  NEURO:  She had no  focal deficits and decreased sensation to touch  bilaterally in toes.Marland Kitchen   LABS ON ADMISSION:  Sodium 134, potassium 3.6, chloride 100, bicarb 27, BUN  12, creatinine 0.7, glucose 227, leukocytes 11.9, hemoglobin 12.6,  hematocrit 38.4, thrombocytes 250, ANC 8.1, MCV 89.7, anion gap 7, bilirubin  0.4, alkaline phosphatase 63, SGOT 15, SGPT 15, protein 7.3, albumin 3.5,  calcium 8.9.  Two sets of cardiac enzymes showed CK-MB 2.5 and 2.3,  myoglobin 47 and 56.7, troponin 0.05 and less than 0.05.  A BNP less than  30, alcohol level less than 5, PT 12.5, INR 0.9, PTT 24, pregnancy test by  urine negative, urine glucose more than 1000, ketones 15, leukocyte esterase  trace, epithelial cell few, white blood count 0 to 2, bacteria rare,  positive for trichomonas.  pH was 7.449, PCO2 43, PO2 118, bicarb 30, oxygen  saturation 99%, chest x-ray showed a left lower lobe opacity and possible  left pleural effusion,  a D-Dimer was less than 0.22.  An EKG showed a normal  sinus rhythm, no changes compared to prior.   ASSESSMENT AND PLAN:  1.  Her first problem is chest pain.  The pain is substernal 9 out of 10,      pleuritic, plus nausea, with radiation to left and right arms and it was      initially believed to be cardiac, however, the pleuritic quality and the      burning quality of the pain with normal EKG and normal cardiac enzymes      x3 suggested that it was probably not a cardiac problem.  The patient      received oxygen, nitroglycerin, aspirin, and the pain subsided.      However, we consulted cardiology for possible need for another      Cardiolite (she had a Myoview in 2003 with an ejection fraction of 45%).      Cardiology says that this was not a cardiac problem, however, they      suggested to schedule the patient for a Cardiolite Myoview as an      outpatient.  Right before discharge the patient did not complain of any      chest pain.  A possible pneumonia was in the differential.  We started      the patient on Rocephin and Zithromax as the chest x-ray showed left      lower lobe opacity with possible left pleural effusion.  This was      unlikely a new event as the left lower lobe opacity was seen on an x-ray      in 2005 too.  However, the patient's clinical complaints of cough and      white sputum determined [place] to start her on antibiotics.  2.  Abdominal pain was accompanied by nausea.  An abdominal ultrasound was      performed to rule out polycystic causes like stones and the abdominal      ultrasound was negative.  The association with nausea and diarrhea at      the presentation suggesting a possible gastroenteritis or peptic ulcer      disease or gastritis.  The patient was receiving gastritis and she was      feeling better before discharge.  This clinical presentation is      suggestive of possible gastroparesis due to her long-standing diabetes     so we have scheduled  the patient for a gastric-emptying study in the  radiology department as an outpatient.  We will treat her with Reglan in      case this is positive for gastroparesis.  3.  Gastroesophageal reflux disease.  The patient was discharged on      Protonix.  4.  Trichomonas vaginitis, was diagnosed by detection of trichomonas in the      urine.  Treated with metronidazole one dose.  5.  Dizziness was probably likely to dehydration.  The patient was feeling      better after hydration.  6.  Diabetes mellitus.  We treated her with sliding scale insulin plus 5      units of Lantus in the hospital.  The patient was discharged on her oral      medication.  7.  First and second tooth abscesses were diagnosed by Panorex.  Dr.      Kristin Bruins was consulted and we scheduled an appointment for the patient      with Dr. Kristin Bruins as an outpatient.   LABS AT DISCHARGE:  Temperature 97.9, systolic blood pressure 115, diastolic  blood pressure 74, pulse 78, respiration rate 20, oxygen saturation 97% on  room air, weight 223 pounds, white blood count 10.3, hemoglobin 12,  hematocrit 36.3, thrombocytes 251, sodium 137, potassium 4.1, chloride 105,  bicarb 25, BUN 9, creatinine 0.7, glucose 185, calcium 9.      Carlus Pavlov, M.D.  Electronically Signed     CG/MEDQ  D:  01/20/2006  T:  01/20/2006  Job:  [id number]   cc:   Charlynne Pander, D.D.S.  Fax: [phone]   Vesta Mixer, M.D.  Fax: [phone]

## 2010-11-12 NOTE — Consult Note (Signed)
[name], [name]              ACCOUNT NO.:  [id number]   MEDICAL RECORD NO.:  [id number]          PATIENT TYPE:  INP   LOCATION:  3731                         FACILITY:  [facility]   PHYSICIAN:  Charlynne Pander, D.D.S.DATE OF BIRTH:  03/16/1961   DATE OF CONSULTATION:  01/18/2006  DATE OF DISCHARGE:                                   CONSULTATION   HISTORY OF PRESENT ILLNESS:  [name] is a 50 year old female admitted  with a history of chest pain, to rule out myocardial infarction.  Patient  subsequently started complaining of tooth pain.  Dental consultation  requested to evaluate tooth pain.   MEDICAL HISTORY:  1.  History of chest and upper abdominal pain, currently being evaluated by      the Internal Medicine Program.  2.  Hypertension.  3.  Diabetes mellitus  type 2.  4.  Hyperlipidemia.  5.  Urinary tract infection.  6.  Gastroesophageal reflux disorder.  7.  Peripheral neuropathy.  8.  Status post C-section in 1984.   ALLERGIES:  NONE KNOWN.   MEDICATIONS:  1.  Protonix 40 mg daily.  2.  Lovenox 40 mg subcutaneously at bedtime.  3.  Aspirin 325 mg daily.  4.  Zocor 40 mg every evening.  5.  NovoLog insulin per sliding scale.  6.  Rocephin IV 1 g every 24 hours.  7.  Azithromycin 500 mg IV every 24 hours.   SOCIAL HISTORY:  Patient is single.  Patient with a history of smoking 1/2  pack per day for 6-7 years.  Patient with occasional use of alcohol.   FAMILY HISTORY:  Mother died at the age of 58 with a history of pulmonary  embolism and coronary artery disease.  Father with unknown health  conditions.   FUNCTIONAL ASSESSMENT:  Patient was independent for ADLs prior to this  admission.   REVIEW OF SYSTEMS:  This was reviewed from the chart and health history  assessment form with this admission.   DENTAL HISTORY:  Chief complaint:  Dental consultation requested to evaluate  tooth pain.   HISTORY OF PRESENT ILLNESS:  Patient gave a history of upper  right quadrant  tooth pain which has been hurting off and on for the past 2-3 months.  Patient indicates that the pain is currently 0/10 in intensity, but it was  10/10 intensity several days ago.  Patient describes the pain as being dull  and achy in nature.  Patient indicates that it previously hurt hours at a  time.   Patient has not seen a dentist since 2005/2006 when she had a tooth pulled.  Patient is unsure of the name of the dentist.  Patient had no complications  from that dental extraction.  Patient does not seek regular dental care.   DENTAL EXAMINATION:  GENERAL:  Patient is a well-developed, well-nourished  female in no acute distress.  VITAL SIGNS:  Blood pressure 134/66.  Pulse rate 82.  Respirations of 20.  Temperature is 97.1.  HEAD AND NECK EXAMINATION:  I do not see significant extraoral swelling at  this time.  Patient denies  acute temporomandibular joint symptoms.  INTRAORAL EXAMINATION:  Patient with normal saliva.  I do not see presence  of abscess formation within the mouth.  Patient has very poor oral hygiene,  however.  DENTITION:  Patient with multiple missing teeth #17 and #31.  PERIODONTAL:  Patient with chronic advanced periodontal disease with  generalized gingival recession, generalized tooth mobility, and severe bone  loss.  DENTAL CARIES:  I do not see significant dental caries, although I would  need a full series of dental radiographs to rule out other incipient dental  caries.  ENDODONTIC:  Patient with a history of pulpitis symptoms most likely related  to chronic advanced periodontal disease, although there may now be a  combined endodontic/periodontic infection.  CROWN OR BRIDGE:  There are no crown or bridge restorations.  PROSTHODONTIC:  There are no dentures.  RADIOGRAPHIC OCCLUSION:  The patient has a stable occlusion at this time.   RADIOGRAPHIC INTERPRETATION:  Panoramic x-ray was taken on January 17, 2006 by  the Department of  Radiology.   There are multiple missing teeth #17 and #31.  There is chronic advanced  periodontal disease with severe bone loss.  There is radiographic calculus  noted.  There are no significant dental caries noted at this time, although  a full series of dental radiographs would be needed to rule out incipient  dental caries.  There may  be periapical pathology involving tooth #1 and  #2, and possibly #3 at this time.   ASSESSMENT:  1.  History of pulpitis symptoms, none now.  2.  Chronic advanced periodontal disease with severe bone loss.  3.  Plaque and calculus accumulations.  4.  Generalized gingival recession.  5.  Generalized tooth mobility.  6.  History of oral neglect.  7.  Multiple missing teeth.  8.  Poor occlusal scheme, but a stable occlusion.  9.  Possible periapical pathology involving tooth #1 and #2 and possibly #3.   PLAN/RECOMMENDATIONS:  1.  I discussed the risks, benefits and complications of various treatment      options with the patient in relationship to her medical and dental      conditions.  We discussed various treatment options to include no      treatment, extraction of tooth #1, #2, #3 and #32, periodontal therapy,      dental restorations, crown and bridge therapy, root canal therapy,      implant therapy, and replacement of the same teeth is indicated.  With      the amount of gum disease the patient has, the patient is most likely      going to end up with complete dentures in the future.  Patient currently      wishes to be seen as an outpatient for selective extractions.  We will      attempt to schedule the patient for early next week for evaluation for      those extractions.  Patient also has been given the option of following      up with the      previous dentist, and she will let us know if she decides to do this.  2.  I will discuss findings with the Internal Medicine Program and      coordinate future outpatient treatment as  indicated.      Charlynne Pander, D.D.S.  Electronically Signed     RFK/MEDQ  D:  01/18/2006  T:  01/18/2006  Job:  [id number]   cc:  Alvester Morin, M.D.  Fax: [phone]

## 2010-11-12 NOTE — Discharge Summary (Signed)
[name], [name]              ACCOUNT NO.:  [id number]   MEDICAL RECORD NO.:  [id number]          PATIENT TYPE:  INP   LOCATION:  4705                         FACILITY:  [facility]   PHYSICIAN:  [name], M.D.  DATE OF BIRTH:  1960/12/20   DATE OF ADMISSION:  09/18/2006  DATE OF DISCHARGE:  09/21/2006                               DISCHARGE SUMMARY   DISCHARGE DIAGNOSES:  1. Chest pain with negative cardiac enzymes  2. Moderate nonobstructive coronary artery disease of the left      circumflex and right coronary arteries.  3. Diabetes mellitus type 2.  4. Hyperlipidemia.  5. Gastroesophageal reflux disease.  6. Hypertension   DISCHARGE MEDICATIONS:  1. Lisinopril 20 mg daily.  2. Metformin 1000 mg b.i.d.  3. Glipizide 20 mg daily.  4. Neurontin 300 mg three times a day.  5. Lipitor 40 mg daily.  6. Aspirin 81 mg daily.  7. Protonix 40 mg daily.   PROCEDURES PERFORMED:  1. 2D echocardiogram on September 20, 2006 showed normal left ventricular      systolic function with estimated left ventricular ejection fraction      of 60%.  2. Cardiac catheterization on September 20, 2006 showed moderate      nonobstructive LCX/RCA disease (mid LCX/OM2 with 50% mid stenosis      and mid to distal RCA with 30-40% stenosis), and normal LV systolic      function with EF of 55%.   CONSULTANTS:  Cardiology, Dr. Elmore Guise.   BRIEF HISTORY AND PHYSICAL:  This is a 50 year old female with past  medical history of Type 2 diabetes, peripheral neuropathy, hypertension,  hyperlipidemia, GERD, and tobacco abuse admitted with complaint of  dyspnea, palpitations, and intermittent chest pain for 2-3 weeks; the  chest pain was retrosternal and radiated to the left arm, and was  increasing in frequency.  She had no fever, no chills, no orthopnea, no  nausea, no vomiting.   Vital signs on day of admission:  Temperature 96.8, blood pressure  126/80, pulse 95, respirations 12, oxygen saturation  100% on room air.  Exam on admission showed clear lungs, normal heart sounds, mild  epigastric tenderness, no edema.  On the day of admission, sodium was  133, potassium 4.7, chloride of 103, bicarb 23, BUN 20, creatinine 0.6,  glucose 261, calcium 8.5, white blood cells 12.2, hemoglobin 11.7 and  platelets 255; hemoglobin A1c was 10.6 in February of 2008.   HOSPITAL COURSE:  1. Chest pain.  Cardiac enzymes were negative; echocardiogram showed      normal LV systolic function.  Cardiology was consulted, and cardiac      catheterization was performed with results as above.  Cardiology      recommended aggressive medical therapy and risk factor      modification, with patient to be continued on ACE, aspirin and      Lipitor and to followup with cardiology in two weeks. Patient's      pain had resolved by the time of discharge.  2. Diabetes.  On admission, metformin was stopped.  Patient was  kept      on Lantus and sliding scale.  Her sugars remained under control      during her hospital stay.  After cath, patient was continued on      Lantus.  By the next day on the day of discharge, her Lantus was      discontinued and she was started on her home medications, metformin      1000 b.i.d. plus glipizide 20 mg daily.  3. Hyperlipidemia.  Patient was kept on a statin medication and no      changes were made to her home medication at discharge.  Her goal is      an LDL of 70.  This will be followed up as an outpatient.  4. GERD.  The chest pain was not typical of GERD, but the patient was      continued on Protonix which she was not taking at home.  By the      third day, the chest pain was better.  Patient was advised to      continue her Protonix.   On the day of discharge, her labs were sodium 134, potassium 4.5,  chloride 103, bicarb 27, glucose 151, BUN of 12, creatinine 0.7, and the  CBC showed a white blood cell of 10.4, hemoglobin of 10.7, platelets of  264.   Vital signs on the  day of discharge:  Temperature 97.0, pulse 88,  respirations 18, blood pressure 112/73, and oxygen saturation 99% on  room air.      [name], M.D.  Electronically Signed      [name], M.D.  Electronically Signed    AF/MEDQ  D:  12/08/2006  T:  12/08/2006  Job:  [id number]

## 2010-11-12 NOTE — Op Note (Signed)
NAME:  [name], [name]              ACCOUNT NO.:  [id number]   MEDICAL RECORD NO.:  [id number]          PATIENT TYPE:  AMB   LOCATION:  SDS                          FACILITY:  [facility]   PHYSICIAN:  Jillyn Hidden A. Rankin, M.D.   DATE OF BIRTH:  Feb 27, 1961   DATE OF PROCEDURE:  07/17/2006  DATE OF DISCHARGE:  07/17/2006                               OPERATIVE REPORT   PREOPERATIVE DIAGNOSES:  1. Tractional detachment, left eye, secondary to #2.  2. Proliferative diabetic retinopathy, left eye.  3. Vitreous hemorrhage, left eye.  4. Nuclear sclerotic cataract, left eye.   POSTOPERATIVE DIAGNOSES:  1. Tractional detachment, left eye, secondary to #2.  2. Proliferative diabetic retinopathy, left eye.  3. Vitreous hemorrhage, left eye.  4. Nuclear sclerotic cataract, left eye.   PROCEDURES:  1. Posterior vitrectomy and memory peel, left eye.  2. Endolaser panphotocoagulation, left eye.  3. Pars plana lensectomy with phakofragmentation, with insertion of      posterior chamber intraocular lens in the sulcus, left eye.  4. Air-fluid exchange.   SURGEON:  Alford Highland. Rankin, M.D.   ANESTHESIA:  General endotracheal anesthesia.   INDICATIONS FOR PROCEDURES:  The patient is a 50 year old woman with  neglected diabetic retinopathy, who has previous neovascular glaucoma,  which has been treated successful with a glaucoma shunt valve and  previous panphotocoagulation.  The patient has developed progressive  posterior synechiae with cataract formation and has proliferative  diabetic retinopathy, preretinal hemorrhage and tractional detachment,  which requires vitreous dissection in an attempt to salvage the eye and  to deliver further laser photocoagulation to induce clysis of  retinopathy.  She understands the risks of anesthesia, including the  rare occurrence of death, but also to the eye, including but not limited  to hemorrhage, infection, scarring, need for further surgery, no change  in  vision, loss of vision and progression of disease despite  intervention.  Appropriate signed consent was obtained and the patient  was taken to the operating room.   DESCRIPTION OF PROCEDURES:  In the operating room, appropriate  monitoring was followed by mild sedation.  General endotracheal  anesthesia was instituted without difficulty.  The left periocular  region was sterilely prepped and draped in the usual ophthalmic fashion.  A lid speculum was applied.  A 25-gauge trocar system was then used to  place the infusion cannula.  The superior trocars were applied.  A  conjunctival peritomy was then fashioned in the supertemporal quadrant.  At this time, a 25-gauge vitrectomy was begun.  Some of the core  vitrectomy could easily be done in an approach prior to the dissection  of the neovascular tissue off the optic nerve and temporal and inferior  to the temporal vascular arcades, which was carried out with a modified  en bloc technique.   Preretinal hemorrhage was aspirated.  At this time, it was necessary to  perform a lensectomy.  The posterior capsule was opened.  Hydrodissection was then carried out in the capsular bag.  Phakofragmentation and aspiration were then carried out in the bag.  At  this time, the anterior  chamber was then opened with an MVR blade and it  was deepened with Viscoat.  Posterior synechialysis was carried out.  At  this time, the completion of the lens fragment removal was completed  with phakofragmentation through a separate 20-gauge MVR opening in the  supertemporal quadrant.  The BIOM was then used to identify any further  lens fragments in the posterior chamber; there were none.  Excellent  removal of lens and peripheral cortical material was noted.  At this  time, the anterior chamber was then opened through the limbus with low  infusion pressures.  Viscoat maintained the posterior chamber.  At this  time, an Alcon model [id number] lens, power +22.0, was  then placed in the  sulcus and rotated into horizontal position.  Excellent support was  noted and confirmed.  At this time, the limbal wound was then closed  with interrupted 10 Vicryl.  At this time, further dissection and  removal of the posterior hyaloid with its neovascular attachments off  the optic nerve and inferotemporally were then carried out with modified  en bloc technique.  The vitreous skirt was elevated 360 degrees anterior  to the equator.  And it was trimmed 360 degrees.  A hole was noted nasal  to the optic nerve, as well as in the far 9-o'clock temporal periphery.  Laser photocoagulation was placed around these areas and there was no  retinal detachment.  At this time, fluid-air exchange was completed so  as to maintain the retinal reattached state, as well as to help prevent  elevation of intraocular pressure in the short-term, while the Viscoat  dissolves.  At this time, the conjunctiva was then closed  supertemporally with 7-0 Vicryl suture.  The infusion cannula was  removed under high infusion pressure, and the wounds were secured.  The  infusion cannula was removed.  Subconjunctival injections of Decadron  and antibiotics were applied.  A sterile patch and a Fox shield were  applied.  The patient tolerated the procedure without complication.  The  patient was taken to the PACU in good and stable condition.      [name], M.D.  Electronically Signed     GAR/MEDQ  D:  07/17/2006  T:  07/17/2006  Job:  [id number]

## 2010-11-12 NOTE — Consult Note (Signed)
NAME:  [name], [name]                        ACCOUNT NO.:  [id number]   MEDICAL RECORD NO.:  [id number]                   PATIENT TYPE:  INP   LOCATION:  4704                                 FACILITY:  [facility]   PHYSICIAN:  Vesta Mixer, M.D.              DATE OF BIRTH:  [birth year]   DATE OF CONSULTATION:  02/11/2002  DATE OF DISCHARGE:                              CARDIOLOGY CONSULTATION   REASON FOR ADMISSION:  The patient is a 50 year old female with a history of  diabetes mellitus.  She is admitted with episodes of chest pain.   HISTORY OF PRESENT ILLNESS:  The patient is a 50 year old black female with  a history of diabetes and obesity.  She presents with several days of chest  pain.  The chest pain is quite atypical.  It does not seem to be associated  with any activities, eating, drinking, change in position or taking a deep  breath.  She also complains of having some shortness of breath, particularly  with exertion or climbing stairs.  She has noticed that she becomes more  fatigued recently.  She denies any cough or sputum production.  She has not  had any cough or cold symptoms.   CURRENT MEDICATIONS:  1. Glucotrol XL 10 mg p.o. b.i.d.  2. Lovenox here in the hospital.   ALLERGIES:  She has no known drug allergies.   PAST MEDICAL HISTORY:  1. Diabetes mellitus.  2. Obesity.   SOCIAL HISTORY:  The patient smokes.   FAMILY HISTORY:  The patient's family history is positive for coronary  artery disease.   REVIEW OF SYSTEMS:  Review of systems was reviewed and is essentially  negative.   PHYSICAL EXAMINATION:  GENERAL:  She is a young female in no acute distress.  VITAL SIGNS:  Her vital signs are stable.  Her blood pressure is 130/60.  Her heart rate is 85.  HEENT/NECK:  Exam reveals 2+ carotids.  She has no bruits.  There is no JVD  and no thyromegaly.  LUNGS:  Clear to auscultation.  HEART:  Heart regular rate, S1 and S2, with no murmurs, gallops or  rubs.  ABDOMEN:  Exam reveals good bowel sounds and is nontender.  EXTREMITIES:  She has no clubbing, cyanosis, or edema.  NEUROLOGIC:  Exam was nonfocal.   LABORATORY AND ACCESSORY CLINICAL DATA:  Her EKG reveals normal sinus rhythm  with no ST or T wave changes.   Her cardiac enzymes are negative.   ASSESSMENT AND PLAN:  The patient presents with some episodes of chest pain.  These episodes are fairly atypical and I doubt that this is due to a cardiac  etiology.  I do agree that we should perform a stress Cardiolite study; we  will schedule her for an adenosine Cardiolite study tomorrow.  Vesta Mixer, M.D.    PJN/MEDQ  D:  02/11/2002  T:  02/13/2002  Job:  [id number]   cc:   Alvester Morin, M.D.

## 2010-11-12 NOTE — Cardiovascular Report (Signed)
[name], [name]              ACCOUNT NO.:  [id number]   MEDICAL RECORD NO.:  [id number]          PATIENT TYPE:  INP   LOCATION:  4705                         FACILITY:  [facility]   PHYSICIAN:  Elmore Guise., M.D.DATE OF BIRTH:  08-07-1960   DATE OF PROCEDURE:  09/20/2006  DATE OF DISCHARGE:                            CARDIAC CATHETERIZATION   INDICATIONS FOR PROCEDURE:  Chest pain with multiple cardiac risk  factors.   HISTORY OF PRESENT ILLNESS:  [name] is a very pleasant 50 year old  African-American female with past medical history of hypertension,  diabetes mellitus, dyslipidemia and tobacco dependence who presented for  evaluation of increasing exertional chest pain and dyspnea.  The patient  is now referred for cardiac catheterization.   DESCRIPTION OF PROCEDURE:  The patient is brought to the cardiac cath  lab after appropriate full consent.  She is prepped and draped in  sterile fashion.  She was given approximately 20 mL of 1% lidocaine.  A  5-French sheath was placed in the right femoral vein and a 5-French  sheath was placed in the right femoral artery.  Assistance with Smart  needle was obtained due to the patient's vein being very deep on her  initial stick.  Coronary angiography, LV angiography and limited right  femoral angiography were then performed.  The patient tolerated the  procedure well and was transferred from the cardiac cath lab in stable  condition.  She was given 200 mL of intracoronary nitro to see if her  circumflex/OM2 stenosis would change with no significant changes in the  diameter of stenosis.   FINDINGS:  1. Left Main:  Normal.  2. LAD:  Large vessel with mild luminal irregularities.  3. D1:  Moderate-sized vessel with mild luminal irregularities.  4. LCX:  Codominant, large vessel with proximal luminal      irregularities, large OM1 with mild luminal irregularities.      Continuation of LCX/OM2 vessel shows also  moderate-to-large vessel      with mid 50% stenosis and distal luminal irregularities.  5. RCA:  Codominant with proximal to mid luminal irregularities and      mid to distal 30-40% stenosis.  6. LV:  EF is 55-60%.  No wall motion abnormalities.  LVEDP is 9 mmHg.  7. Limited right femoral angiogram shows normal appearing femoral      artery with no significant obstructive disease.  No sheath      dissection noted.  Arteriotomy site is in good position.   IMPRESSION:  1. Moderate nonobstructive LCX/RCA disease (mid LCX/OM2 with 50% mid      stenosis and mid to distal RCA with 30-40% stenosis).  2. Normal LV systolic function with an EF of 55%.  3. Normal-appearing femoral artery.   PLAN:  At this time, I would recommend aggressive medical therapy and  risk factor modification as indicated.  Complete tobacco cessation.  Lipid management and diabetic control is recommended      Elmore Guise., M.D.  Electronically Signed     TWK/MEDQ  D:  09/20/2006  T:  09/20/2006  Job:  [id number]

## 2010-11-12 NOTE — Discharge Summary (Signed)
[name], [name]              ACCOUNT NO.:  [id number]   MEDICAL RECORD NO.:  [id number]          PATIENT TYPE:  INP   LOCATION:  3731                         FACILITY:  [facility]   PHYSICIAN:  Carlus Pavlov, M.D. DATE OF BIRTH:  29-Sep-1960   DATE OF ADMISSION:  01/17/2006  DATE OF DISCHARGE:  01/19/2006                                 DISCHARGE SUMMARY   ADDENDUM:   DISCHARGE MEDICATIONS:  1.  Protonix 40 mg p.o. once daily  2.  Glipizide 10 mg 1 tablet p.o. once daily  3.  Metformin 500 mg p.o. b.i.d.  4.  Neurontin 300 mg p.o. t.i.d.  5.  Lisinopril 20 mg p.o. once daily.   The patient has an appointment with Dr. Elvera Lennox in the outpatient clinic on  February 06, 2006 at 1:30 p.m. and with Dr. Elease Hashimoto, cardiology, on January 20, 2006 at 3:40 p.m. and she has a gastric emptying study at the [facility] on January 25, 2006 at 7:30 a.m.  The patient is to follow a  diabetic diet.  Also, the patient is to call Dr. Kristin Bruins for a dental  appointment.      Carlus Pavlov, M.D.  Electronically Signed     CG/MEDQ  D:  01/21/2006  T:  01/22/2006  Job:  [id number]

## 2010-11-12 NOTE — Discharge Summary (Signed)
NAME:  [name], [name]                        ACCOUNT NO.:  [id number]   MEDICAL RECORD NO.:  [id number]                   PATIENT TYPE:  INP   LOCATION:  4704                                 FACILITY:  [facility]   PHYSICIAN:  [name], M.D.             DATE OF BIRTH:  Jun 03, 1961   DATE OF ADMISSION:  02/10/2002  DATE OF DISCHARGE:  02/12/2002                                 DISCHARGE SUMMARY   DISCHARGE DIAGNOSES:  1. Chest pain; to rule out myocardial infarction and pulmonary embolism..  2. Diabetes mellitus.  3. Obesity.  4. Current tobacco abuse.   MEDICATIONS:  1. Glipizide 10 mg one p.o. b.i.d.  2. Glucophage 5 mg one p.o. b.i.d.  3. Protonix 40 mg one p.o. q.d.  4. Aspirin 325 mg one p.o. q.d.   DISPOSITION AND FOLLOWUP:  She is to be discharged and follow up with Dr.  Liliane Channel in her continuity clinic and an appointment is to be made.   PROCEDURES:  Echo showed overall left ventricular systolic function appeared  normal. As much as it could be evaluated. The left ventricular ejection  fraction is estimated  to be 55% or greater and the study was inadequate for  an evaluation of left ventricular wall motion.   CONSULTS:  Dr. Elease Hashimoto was consulted for cardiology for the patient's chest  pain and a stress Cardiolite was performed. Adenosine/Cardiolite showed no  EKG changes and abnormal Cardiolite images.   HISTORY OF PRESENT ILLNESS:  The patient is a 50 year old African American  woman with a past medical history of insulin-dependent diabetes who came in  with complaints of chest pain for three days. The pain was present on rest  as well as the left part of the chest, diffuse and radiated to the left arm.  It was a constant pain not relieved by anything. It increased on exertion,  pleuritic in nature. No history of any trauma, history of shortness of  breath and increased heart rate, increased sweating the night before. It was  not positional. No associated  fever, chills or cough. No GERD symptoms. The  pain increased with deep breaths.   ALLERGIES:  No known drug allergies.   MEDICATIONS:  Glipizide 10 mg b.i.d. p.o.   SOCIAL HISTORY:  Substance abuse one pack per week for the past two years of  tobacco.  She is a Lawyer at [employer]. She is single and she has insurance.  She lives with her two children.   FAMILY HISTORY:  Her mother died of a pulmonary embolism in her 30s. Her  sister had hypertension and an aunt with diabetes.   PHYSICAL EXAMINATION:  VITAL SIGNS:  On arrival, pulse 94, blood pressure  136/73, temperature 98, respiratory rate 20 per minute, O2 saturations of 98  on 2 L of oxygen.  GENERAL:  She was alert and oriented and in pain.  HEENT:  Eyes, PERRLA.  Extraocular movements  positive. ENT was normal.  NECK:  No JVD.  LUNGS:  Respirations good, clear.  CARDIOVASCULAR:  Regular rate and rhythm, no murmurs, rubs, gallops.  ABDOMEN:  Soft, epigastric tenderness was present and bowel sounds were  heard.  EXTREMITIES:  No cyanosis, clubbing or edema.  NEUROLOGIC:  She was intact.   LABORATORY DATA:  On arrival.  Sodium 134,  potassium 4.5, chloride 103, CO2  26, BUN 22, creatinine 0.9, serum glucose 243. WBCs 7.8, hemoglobin 13.5,  platelets 228, ANC 54 and MCV 86.6.  Her urine showed glucose more than  1000, ketones 15 mg/dl, hemoglobin was trace and __________.  A D-dimer was  0.48.   Her EKG was normal.   Her chest x-ray was negative.   HOSPITAL COURSE BY PROBLEM:  Problem # 1:  Chest pain  For her chest pain  she was ruled out for MI. We got the cardiologist involved. He did a stress  Cardiolite on her as well as also an echo was done with the data above. Her  chest pain was thought to be due to a pulmonary embolism, as she has a  family history of pulmonary embolism. We did a spiral CT on her which was  negative, and decided to not go any further with the pulmonary embolism.   Problem #2: Diabetes:  She was on  a sliding scale when she came in, after  which we changed her to her home medications of Glipizide 10 mg p.o. b.i.d.  after which  it still was not  under control  so we added on Glucophage and  she was then on Glucophage 5 mg p.o. q.d.  and b.i.d. as well as Glipizide  10 mg p.o. b.i.d.   DISCHARGE LABORATORY DATA:  Sodium 132, potassium 3.8, chloride 101, CO2 25,  BUN 16, creatinine 0.8, serum glucose 284.  WBCs 7.8, hemoglobin 4.4,  platelets 26. AST 19, ALT 20, albumin 3.1, bilirubin 0.4.   FOLLOWUP:  She is to follow up with me, Dr. Liliane Channel, in the clinic on an  outpatient basis for continuity.                                               [name], M.D.    TRS/MEDQ  D:  02/27/2002  T:  03/01/2002  Job:  [id number]   cc:   Vesta Mixer, M.D.

## 2010-11-16 ENCOUNTER — Ambulatory Visit (INDEPENDENT_AMBULATORY_CARE_PROVIDER_SITE_OTHER): Payer: Medicare Other | Admitting: Family Medicine

## 2010-11-16 VITALS — BP 120/70

## 2010-11-16 DIAGNOSIS — M7501 Adhesive capsulitis of right shoulder: Secondary | ICD-10-CM

## 2010-11-16 DIAGNOSIS — M7500 Adhesive capsulitis of unspecified shoulder: Secondary | ICD-10-CM

## 2010-11-16 MED ORDER — TRAMADOL HCL 50 MG PO TABS: 50.0000 mg | ORAL_TABLET | Freq: Four times a day (QID) | ORAL | Status: DC | PRN

## 2010-11-16 NOTE — Assessment & Plan Note (Addendum)
-   Improving range of motion noted in the office today.  - No need for repeat glenohumeral injection today. - Patient should continue with her home exercise program as she is doing to maintain range of motion. - Follow-up in 4-6 weeks.

## 2010-11-16 NOTE — Assessment & Plan Note (Signed)
Adhesive capsulitis of right shoulder with associated pain which is somewhat chronic in nature with intermittent flares. Suspect this is related to her poor diabetes control. - Underwent glenohumeral corticosteroid injection in the office today. PROCEDURE NOTE: Consent obtained and verified. Area cleansed with alcohol. Topical analgesic spray: Ethyl chloride. Joint: Right glenohumeral joint Approached in typical fashion with: Posterior approach aimed at coracoid Completed without difficulty Meds: 7 cc 1% lidocaine +1 cc Kenalog 40 mg/cc Needle: 25-gauge 1.5 inch Aftercare instructions and Red flags advised. Tolerated procedure well without any complications. Advised may temporarily raise her blood sugars. - Emphasize need to continue with range of motion exercises. - Refill on tramadol was given to use as needed. - Followup 4-6 weeks for reevaluation

## 2010-11-17 NOTE — Progress Notes (Signed)
  Subjective:    Patient ID: [name], female    DOB: Jun 11, 1961, 50 y.o.   MRN: [id number]  HPI 50yo female with poorly controlled DM-2 to office for f/u of bilateral adhesive capsulitis.  Underwent b/l glenohumeral injections in our office 07/14/10 which were helpful.  She has been doing ROM exercises regularly.  She has noted improved ROM on the left, but has been experiencing decreased ROM & increasing pain in the right shoulder over the past several weeks.  She is having some night-time pain.  Tramadol is helpful.  Previously did outpt PT.   Review of Systems Per HPI, otherwise negative    Objective:   Physical Exam GEN: AOx3, NAD, pleasant SKIN: no rashes/lesions MSK:  - C-spine: FROM without pain.  No midline or paraspinal tenderness - Shoulders:      - Rt shoulder:  Decreased ROM - flexion 130, abduction 130, ER 40, IR iliac crest.  Mild TTP over bicipital groove & AC-joint.  (+)Hawkins, (+)Neer, (+)Empty can.  Normal RTC strength, but pain with resisted abduction & ER.      - Lt shoulder:  Mildly decreased ROM - flexion 160, abduction 160, ER 60, IR L4.  Neg Hawkins, neg Neer, mildly (+) Empty can.  Good RTC strength. Neurovascularly intact distally      Assessment & Plan:

## 2010-11-25 ENCOUNTER — Other Ambulatory Visit: Payer: Self-pay | Admitting: Internal Medicine

## 2011-01-11 ENCOUNTER — Other Ambulatory Visit: Payer: Self-pay | Admitting: *Deleted

## 2011-01-11 DIAGNOSIS — M7500 Adhesive capsulitis of unspecified shoulder: Secondary | ICD-10-CM

## 2011-01-11 DIAGNOSIS — F32A Depression, unspecified: Secondary | ICD-10-CM

## 2011-01-11 DIAGNOSIS — F329 Major depressive disorder, single episode, unspecified: Secondary | ICD-10-CM

## 2011-01-11 MED ORDER — AMITRIPTYLINE HCL 25 MG PO TABS: 25.0000 mg | ORAL_TABLET | Freq: Every day | ORAL | Status: DC

## 2011-01-11 MED ORDER — TRAMADOL HCL 50 MG PO TABS: 50.0000 mg | ORAL_TABLET | Freq: Four times a day (QID) | ORAL | Status: DC | PRN

## 2011-01-18 ENCOUNTER — Other Ambulatory Visit: Payer: Self-pay | Admitting: *Deleted

## 2011-01-18 ENCOUNTER — Other Ambulatory Visit: Payer: Self-pay | Admitting: Internal Medicine

## 2011-01-18 MED ORDER — GABAPENTIN 600 MG PO TABS: 600.0000 mg | ORAL_TABLET | Freq: Three times a day (TID) | ORAL | Status: DC

## 2011-01-19 ENCOUNTER — Ambulatory Visit: Payer: Medicare Other | Admitting: Family Medicine

## 2011-02-09 ENCOUNTER — Telehealth: Payer: Self-pay | Admitting: *Deleted

## 2011-02-09 ENCOUNTER — Emergency Department (HOSPITAL_COMMUNITY): Payer: Medicare Other

## 2011-02-09 ENCOUNTER — Emergency Department (HOSPITAL_COMMUNITY)
Admission: EM | Admit: 2011-02-09 | Discharge: 2011-02-09 | Disposition: A | Discharge status: Home / Self Care | Payer: Medicare Other | Attending: Emergency Medicine | Admitting: Emergency Medicine

## 2011-02-09 DIAGNOSIS — R079 Chest pain, unspecified: Secondary | ICD-10-CM | POA: Insufficient documentation

## 2011-02-09 DIAGNOSIS — E119 Type 2 diabetes mellitus without complications: Secondary | ICD-10-CM | POA: Insufficient documentation

## 2011-02-09 DIAGNOSIS — Z794 Long term (current) use of insulin: Secondary | ICD-10-CM | POA: Insufficient documentation

## 2011-02-09 DIAGNOSIS — I10 Essential (primary) hypertension: Secondary | ICD-10-CM | POA: Insufficient documentation

## 2011-02-09 DIAGNOSIS — K219 Gastro-esophageal reflux disease without esophagitis: Secondary | ICD-10-CM | POA: Insufficient documentation

## 2011-02-09 DIAGNOSIS — Z79899 Other long term (current) drug therapy: Secondary | ICD-10-CM | POA: Insufficient documentation

## 2011-02-09 DIAGNOSIS — H409 Unspecified glaucoma: Secondary | ICD-10-CM | POA: Insufficient documentation

## 2011-02-09 DIAGNOSIS — E7800 Pure hypercholesterolemia, unspecified: Secondary | ICD-10-CM | POA: Insufficient documentation

## 2011-02-09 LAB — COMPREHENSIVE METABOLIC PANEL
Alkaline Phosphatase: 75 U/L (ref 39–117)
BUN: 21 mg/dL (ref 6–23)
Calcium: 10.2 mg/dL (ref 8.4–10.5)
GFR calc Af Amer: >60 mL/min (ref >60)
GFR calc non Af Amer: >60 mL/min (ref >60)
Glucose, Bld: 147 mg/dL — ABNORMAL HIGH (ref 70–99)
Potassium: 4.6 mEq/L (ref 3.5–5.1)
Total Protein: 7.7 g/dL (ref 6.0–8.3)

## 2011-02-09 LAB — DIFFERENTIAL
Basophils Relative: 0 % (ref 0–1)
Eosinophils Absolute: 0.2 10*3/uL (ref 0.0–0.7)
Monocytes Absolute: 0.8 10*3/uL (ref 0.1–1.0)
Monocytes Relative: 5 % (ref 3–12)

## 2011-02-09 LAB — CBC
MCH: 28.7 pg (ref 26.0–34.0)
MCHC: 33.2 g/dL (ref 30.0–36.0)
Platelets: 287 10*3/uL (ref 150–400)

## 2011-02-09 LAB — CK TOTAL AND CKMB (NOT AT ARMC): Total CK: 90 U/L (ref 7–177)

## 2011-02-09 LAB — POCT I-STAT TROPONIN I: Troponin i, poc: 0.02 ng/mL (ref 0.00–0.08)

## 2011-02-09 NOTE — Telephone Encounter (Signed)
Agree with ER rec

## 2011-02-09 NOTE — Telephone Encounter (Signed)
Pt called with c/o back and chest pain.  Onset 1 1/2 weeks ago.  Pain is on and off. States her heart beats real fast, nausea after eating.  Rates pain 9/10. Denies diaphoresis, SOB. Today she is not feeling any better. Chest hurts, left arm and shoulder hurts.  Pt advised to go to ED for evaluation now.   Hx of DM and HTN

## 2011-02-11 ENCOUNTER — Other Ambulatory Visit: Payer: Self-pay | Admitting: Internal Medicine

## 2011-02-11 ENCOUNTER — Ambulatory Visit (INDEPENDENT_AMBULATORY_CARE_PROVIDER_SITE_OTHER): Payer: Medicare Other | Admitting: Internal Medicine

## 2011-02-11 ENCOUNTER — Encounter: Payer: Self-pay | Admitting: Internal Medicine

## 2011-02-11 VITALS — BP 112/72 | HR 100 | Temp 99.2°F | Ht 66.0 in | Wt 246.7 lb

## 2011-02-11 DIAGNOSIS — R110 Nausea: Secondary | ICD-10-CM

## 2011-02-11 DIAGNOSIS — R079 Chest pain, unspecified: Secondary | ICD-10-CM

## 2011-02-11 DIAGNOSIS — M549 Dorsalgia, unspecified: Secondary | ICD-10-CM

## 2011-02-11 DIAGNOSIS — E119 Type 2 diabetes mellitus without complications: Secondary | ICD-10-CM

## 2011-02-11 LAB — GLUCOSE, CAPILLARY: Glucose-Capillary: 235 mg/dL — ABNORMAL HIGH (ref 70–99)

## 2011-02-11 MED ORDER — PROMETHAZINE HCL 12.5 MG PO TABS: 12.5000 mg | ORAL_TABLET | Freq: Three times a day (TID) | ORAL | Status: DC | PRN

## 2011-02-11 NOTE — Assessment & Plan Note (Signed)
ER followup for chest pain. Cardiac etiology of her chest pain was ruled out with a negative cardiac enzymes and normal EKG. On my exam today she was mildly tender to palpation in the mid sternum area. The most likely etiology for her chest pain is musculoskeletal. She was advised to take over-the-counter NSAIDs for her chest pain( could not tolerate Percocet ).Patient has an appointment scheduled with [facility] cardiology for Monday. She was advised not to miss her appointment.

## 2011-02-11 NOTE — Patient Instructions (Signed)
Please take your medicines as prescribed. Please call the clinic if your symptoms get worse. Please schedule a follow up appointment in 1- 2 months with your PCP. Please keep up with your appointment with [facility].   Back Pain (Lumbosacral Strain) Back pain is one of the most common causes of pain. There are many causes of back pain. Most are not serious conditions.  CAUSES Your backbone (spinal column) is made up of 24 main vertebral bodies, the sacrum, and the coccyx. These are held together by muscles and tough, fibrous tissue (ligaments). Nerve roots pass through the openings between the vertebrae. A sudden move or injury to the back may cause injury to, or pressure on, these nerves. This may result in localized back pain or pain movement (radiation) into the buttocks, down the leg, and into the foot. Sharp, shooting pain from the buttock down the back of the leg (sciatica) is frequently associated with a ruptured (herniated) disc. Pain may be caused by muscle spasm alone. Your caregiver can often find the cause of your pain by the details of your symptoms and an exam. In some cases, you may need tests (such as X-rays). Your caregiver will work with you to decide if any tests are needed based on your specific exam. HOME CARE INSTRUCTIONS  Avoid an underactive lifestyle. Active exercise, as directed by your caregiver, is your greatest weapon against back pain.   Avoid hard physical activities (tennis, racquetball, water-skiing) if you are not in proper physical condition for it. This may aggravate and/or create problems.   If you have a back problem, avoid sports requiring sudden body movements. Swimming and walking are generally safer activities.   Maintain good posture.   Avoid becoming overweight (obese).   Use bed rest for only the most extreme, sudden (acute) episode. Your caregiver will help you determine how much bed rest is necessary.   For acute conditions, you may  put ice on the injured area.   Put ice in a plastic bag.   Place a towel between your skin and the bag.   Leave the ice on for 5 minutes at a time, every 2 hours, or as needed.   After you are improved and more active, it may help to apply heat for 30 minutes before activities.  See your caregiver if you are having pain that lasts longer than expected. Your caregiver can advise appropriate exercises and/or therapy if needed. With conditioning, most back problems can be avoided. SEEK IMMEDIATE MEDICAL CARE IF:  You have numbness, tingling, weakness, or problems with the use of your arms or legs.   You experience severe back pain not relieved with medicines.   There is a change in bowel or bladder control.   You have increasing pain in any area of the body, including your belly (abdomen).   You notice shortness of breath, dizziness, or feel faint.   You feel sick to your stomach (nauseous), are throwing up (vomiting), or become sweaty.   You notice discoloration of your toes or legs, or your feet get very cold.   Your back pain is getting worse.   You have an oral temperature above 101F, not controlled by medicine.  MAKE SURE YOU:   Understand these instructions.   Will watch your condition.   Will get help right away if you are not doing well or get worse.  Document Released: 03/23/2005 Document Re-Released: 09/07/2009 [facility] Patient Information 2011 [place], [place].

## 2011-02-11 NOTE — Progress Notes (Signed)
  Subjective:    Patient ID: [name], female    DOB: 03/27/1961, 50 y.o.   MRN: [id number]  HPI: 50 year old woman with past medical history significant for type 2 diabetes mellitus, hypertension comes to the clinic as a ER followup.   She was seen in the ER on 02/09/2011 for chest pain. Cardiac etiology for the chest pain was ruled out with negative cardiac enzymes and EKG and was discharged home on Percocets and a follow up appointment with our clinic and [facility] cardiology. She took Percocets for 1 day but started feeling nauseous and dizzy on the following day and she stopped taking that medication. As of today she complains of some nausea but denies any chest pain, shortness of breath, palpitations.  She also reports severe back pain that has been going on for one to 2 weeks. It was also present at the time she was seen in the ER. She describes her pain as dull achy discomfort, rates her pain 5-6/10 with no radiation. The pain is worse in the morning when she gets up and eases out when she walks around but today it denies any history of trauma.      Review of Systems  Constitutional: Negative for chills, activity change, appetite change and fatigue.  HENT: Negative for nosebleeds, congestion, rhinorrhea, sneezing and postnasal drip.   Eyes: Negative for photophobia and visual disturbance.  Respiratory: Negative for apnea, cough, choking, chest tightness, shortness of breath, wheezing and stridor.   Cardiovascular: Negative for chest pain, palpitations and leg swelling.  Gastrointestinal: Positive for nausea. Negative for abdominal pain, diarrhea and constipation.  Genitourinary: Negative for dysuria and hematuria.  Musculoskeletal: Positive for back pain. Negative for arthralgias.  Neurological: Negative for dizziness and headaches.  Hematological: Negative for adenopathy.       Objective:   Physical Exam  Constitutional: She is oriented to person, place, and time. She appears  well-developed and well-nourished. No distress.  HENT:  Head: Normocephalic and atraumatic.  Eyes: Conjunctivae and EOM are normal. Pupils are equal, round, and reactive to light. No scleral icterus.  Neck: Normal range of motion. Neck supple. No JVD present. No tracheal deviation present. No thyromegaly present.  Cardiovascular: Normal rate, regular rhythm, normal heart sounds and intact distal pulses.  Exam reveals no gallop and no friction rub.   No murmur heard. Pulmonary/Chest: Effort normal and breath sounds normal. No stridor. No respiratory distress. She has no wheezes. She has no rales.       Tender to palpation in the mid sternal area.  Abdominal: Soft. Bowel sounds are normal. She exhibits no distension and no mass. There is no tenderness. There is no rebound and no guarding.  Musculoskeletal: Normal range of motion. She exhibits no edema.       Lumbar paraspinal tenderness present.  Lymphadenopathy:    She has no cervical adenopathy.  Neurological: She is alert and oriented to person, place, and time. She has normal reflexes. She displays normal reflexes. No cranial nerve deficit. She exhibits normal muscle tone. Coordination normal.  Skin: Skin is warm. She is not diaphoretic.          Assessment & Plan:

## 2011-02-14 DIAGNOSIS — R112 Nausea with vomiting, unspecified: Secondary | ICD-10-CM | POA: Insufficient documentation

## 2011-02-14 NOTE — Assessment & Plan Note (Signed)
2 weeks onset, with no radiation- likely lumbosacral strain. She was advised - to take as needed pain medication.  - warm/cold compresses.

## 2011-02-14 NOTE — Assessment & Plan Note (Signed)
Differentials include medication side effect( percocet) vs gastroparesis. Had an episode of vomiting( non- bilious, non- bloody) 2 days prior to clinic visit. Will treat symptomatically with phenergan.

## 2011-02-14 NOTE — Assessment & Plan Note (Signed)
A1C improved from last time. Continue current regimen for now.  Lab Results  Component Value Date   HGBA1C 8.0 02/11/2011   HGBA1C 8.5 06/10/2010   CREATININE 0.85 02/09/2011   CREATININE 1.04 09/10/2010   MICROALBUR 0.50 06/10/2010   MICRALBCREAT 3.4 06/10/2010   CHOL 157 09/10/2010   HDL 54 09/10/2010   TRIG 209* 09/10/2010    Last eye exam and foot exam: No results found for this basename: HMDIABEYEEXA, HMDIABFOOTEX    Assessment: Diabetes control: controlled Progress toward goals: improved Barriers to meeting goals: no barriers identified  Plan: Diabetes treatment: continue current medications Refer to: none Instruction/counseling given: reminded to get eye exam, reminded to bring blood glucose meter & log to each visit, reminded to bring medications to each visit, discussed foot care, discussed the need for weight loss and discussed diet

## 2011-02-20 ENCOUNTER — Inpatient Hospital Stay (INDEPENDENT_AMBULATORY_CARE_PROVIDER_SITE_OTHER)
Admission: RE | Admit: 2011-02-20 | Discharge: 2011-02-20 | Disposition: A | Discharge status: Home / Self Care | Payer: Medicare Other | Source: Ambulatory Visit | Attending: Family Medicine | Admitting: Family Medicine

## 2011-02-20 DIAGNOSIS — H5710 Ocular pain, unspecified eye: Secondary | ICD-10-CM

## 2011-02-20 DIAGNOSIS — R51 Headache: Secondary | ICD-10-CM

## 2011-02-20 DIAGNOSIS — I10 Essential (primary) hypertension: Secondary | ICD-10-CM

## 2011-02-24 ENCOUNTER — Other Ambulatory Visit: Payer: Self-pay | Admitting: Internal Medicine

## 2011-03-14 ENCOUNTER — Other Ambulatory Visit: Payer: Self-pay | Admitting: *Deleted

## 2011-03-14 DIAGNOSIS — M7500 Adhesive capsulitis of unspecified shoulder: Secondary | ICD-10-CM

## 2011-03-14 MED ORDER — TRAMADOL HCL 50 MG PO TABS: 50.0000 mg | ORAL_TABLET | Freq: Four times a day (QID) | ORAL | Status: DC | PRN

## 2011-03-23 LAB — CBC
MCHC: 33.2
MCV: 86.2
Platelets: 277
RDW: 15
WBC: 12.6 — ABNORMAL HIGH

## 2011-03-23 LAB — BASIC METABOLIC PANEL
BUN: 12
Chloride: 100
Creatinine, Ser: 0.7
Glucose, Bld: 127 — ABNORMAL HIGH

## 2011-03-24 ENCOUNTER — Other Ambulatory Visit: Payer: Self-pay | Admitting: Internal Medicine

## 2011-03-24 NOTE — Telephone Encounter (Signed)
Needs appt end of Nov / early Dec

## 2011-03-25 ENCOUNTER — Ambulatory Visit (INDEPENDENT_AMBULATORY_CARE_PROVIDER_SITE_OTHER): Payer: Medicare Other

## 2011-03-25 ENCOUNTER — Inpatient Hospital Stay (INDEPENDENT_AMBULATORY_CARE_PROVIDER_SITE_OTHER)
Admission: RE | Admit: 2011-03-25 | Discharge: 2011-03-25 | Disposition: A | Discharge status: Home / Self Care | Payer: Medicare Other | Source: Ambulatory Visit | Attending: Family Medicine | Admitting: Family Medicine

## 2011-03-25 DIAGNOSIS — M545 Low back pain, unspecified: Secondary | ICD-10-CM

## 2011-03-25 DIAGNOSIS — M62838 Other muscle spasm: Secondary | ICD-10-CM

## 2011-03-31 LAB — GLUCOSE, CAPILLARY: Glucose-Capillary: 83 mg/dL (ref 70–99)

## 2011-04-07 LAB — DIFFERENTIAL
Eosinophils Absolute: 0.1
Lymphocytes Relative: 25
Lymphs Abs: 3.6 — ABNORMAL HIGH
Neutrophils Relative %: 70

## 2011-04-07 LAB — POCT CARDIAC MARKERS: Operator id: 272551

## 2011-04-07 LAB — HEPATIC FUNCTION PANEL
ALT: 23
Albumin: 3.7
Alkaline Phosphatase: 66
Total Protein: 7.6

## 2011-04-07 LAB — I-STAT 8, (EC8 V) (CONVERTED LAB)
Bicarbonate: 26.2 — ABNORMAL HIGH
HCT: 38
Potassium: 4
TCO2: 28
pH, Ven: 7.354 — ABNORMAL HIGH

## 2011-04-07 LAB — POCT I-STAT CREATININE
Creatinine, Ser: 0.7
Operator id: 272551

## 2011-04-07 LAB — CBC
Platelets: 282
WBC: 14.5 — ABNORMAL HIGH

## 2011-04-07 LAB — LIPASE, BLOOD: Lipase: 22

## 2011-04-11 LAB — BASIC METABOLIC PANEL
CO2: 27
Chloride: 104
Creatinine, Ser: 0.81
GFR calc Af Amer: >60
Potassium: 4
Sodium: 138

## 2011-04-11 LAB — CBC
HCT: 33.9 — ABNORMAL LOW
Hemoglobin: 11.3 — ABNORMAL LOW
MCHC: 33.4
MCV: 86.1
RBC: 3.94

## 2011-04-12 ENCOUNTER — Telehealth: Payer: Self-pay | Admitting: *Deleted

## 2011-04-12 NOTE — Telephone Encounter (Signed)
Pt called with c/o swelling to feet. I called back with questions and no answer.  Message left.

## 2011-04-12 NOTE — Telephone Encounter (Signed)
Pt returned call and states both feet are swelling and back pain. She was seen at [facility] for this back pain 2 weeks ago and was told to f/u with [place]. Denies urinary symptoms.  Pain is low back, rates pain 8/10.  Not taking any pain meds.  No known injury but did twist back when getting off the floor.  Nothing makes it feel better.  Appointment given for Friday.  I offered appointments for Wed and [facility] but pt wants Friday AM She is seeing Foot MD on Thursday.

## 2011-04-15 ENCOUNTER — Encounter: Payer: Self-pay | Admitting: Internal Medicine

## 2011-04-15 ENCOUNTER — Ambulatory Visit (INDEPENDENT_AMBULATORY_CARE_PROVIDER_SITE_OTHER): Payer: Medicare Other | Admitting: Internal Medicine

## 2011-04-15 DIAGNOSIS — I10 Essential (primary) hypertension: Secondary | ICD-10-CM

## 2011-04-15 DIAGNOSIS — K219 Gastro-esophageal reflux disease without esophagitis: Secondary | ICD-10-CM

## 2011-04-15 DIAGNOSIS — M7500 Adhesive capsulitis of unspecified shoulder: Secondary | ICD-10-CM

## 2011-04-15 DIAGNOSIS — E119 Type 2 diabetes mellitus without complications: Secondary | ICD-10-CM

## 2011-04-15 DIAGNOSIS — M545 Low back pain, unspecified: Secondary | ICD-10-CM

## 2011-04-15 DIAGNOSIS — F32A Depression, unspecified: Secondary | ICD-10-CM

## 2011-04-15 DIAGNOSIS — Z23 Encounter for immunization: Secondary | ICD-10-CM

## 2011-04-15 DIAGNOSIS — F329 Major depressive disorder, single episode, unspecified: Secondary | ICD-10-CM

## 2011-04-15 MED ORDER — PROMETHAZINE HCL 12.5 MG PO TABS: 12.5000 mg | ORAL_TABLET | Freq: Four times a day (QID) | ORAL | Status: DC | PRN

## 2011-04-15 MED ORDER — AMITRIPTYLINE HCL 25 MG PO TABS: 25.0000 mg | ORAL_TABLET | Freq: Every day | ORAL | Status: DC

## 2011-04-15 MED ORDER — LISINOPRIL 20 MG PO TABS: 40.0000 mg | ORAL_TABLET | Freq: Every day | ORAL | Status: DC

## 2011-04-15 MED ORDER — CYCLOBENZAPRINE HCL 5 MG PO TABS: 5.0000 mg | ORAL_TABLET | Freq: Three times a day (TID) | ORAL | Status: AC | PRN

## 2011-04-15 MED ORDER — OMEPRAZOLE 20 MG PO CPDR: 40.0000 mg | DELAYED_RELEASE_CAPSULE | Freq: Every day | ORAL | Status: DC

## 2011-04-15 MED ORDER — LIDOCAINE 5 % EX PTCH: 1.0000 | MEDICATED_PATCH | Freq: Two times a day (BID) | CUTANEOUS | Status: DC

## 2011-04-15 NOTE — Progress Notes (Deleted)
  Subjective:    Patient ID: [name], female    DOB: [birth year], 50 y.o.   MRN: [id number]  HPI    Review of Systems     Objective:   Physical Exam        Assessment & Plan:

## 2011-04-15 NOTE — Patient Instructions (Signed)
Please, follow up in 4-8 weeks and call with any questions.

## 2011-04-15 NOTE — Progress Notes (Signed)
HPI: 1. Follow up from ED for LBP. Was Dx-ed with MSK strain. Patient states that her pain is not completely relieved with tramadol and requests "oxycodone." Describes pain as 10/10 in intensity; no radiculopathy, no bowel or bladder incontinence, no weakness. Denies any fever, chills, or trauma 2. Refills on her meds.  Past Medical History  Diagnosis Date  . Diabetes mellitus   . Hypertension   . Hyperlipidemia   . GERD (gastroesophageal reflux disease)   . Adhesive capsulitis of shoulder     bilateral, Steroid injection Dr. Nedra Hai 1/12 bilaterally  . Peripheral neuropathy   . Adhesive capsulitis of right shoulder     Steroid injection Dr. Nedra Hai 1/12  . Adhesive capsulitis of left shoulder     Steroid injection Dr. Nedra Hai 1/12  . Obesity   . Diabetic retinopathy   . Glaucoma    Current Outpatient Prescriptions  Medication Sig Dispense Refill  . amitriptyline (ELAVIL) 25 MG tablet Take 1 tablet (25 mg total) by mouth at bedtime.  30 tablet  3  . cyclobenzaprine (FLEXERIL) 5 MG tablet Take 1 tablet (5 mg total) by mouth 3 (three) times daily as needed for muscle spasms.  30 tablet  0  . gabapentin (NEURONTIN) 600 MG tablet Take 1 tablet (600 mg total) by mouth 3 (three) times daily.  90 tablet  3  . gabapentin (NEURONTIN) 600 MG tablet Take 1 tablet (600 mg total) by mouth 3 (three) times daily.  90 tablet  5  . insulin aspart (NOVOLOG FLEXPEN) 100 UNIT/ML injection Inject 3 units into the skin 10 mins before breakfast, 2 units 10 mins before lunch and 5 units 10 mins before dinner  10 mL  12  . insulin glargine (LANTUS) 100 UNIT/ML injection Inject 25 Units into the skin at bedtime.  10 mL  12  . lidocaine (LIDODERM) 5 % Place 1 patch onto the skin every 12 (twelve) hours. Remove & Discard patch within 12 hours or as directed by MD  10 patch  3  . lisinopril (PRINIVIL,ZESTRIL) 20 MG tablet Take 2 tablets (40 mg total) by mouth daily.  30 tablet  11  . metFORMIN (GLUCOPHAGE) 1000 MG tablet Take  1 tablet (1,000 mg total) by mouth 2 (two) times daily with a meal.  60 tablet  5  . omeprazole (PRILOSEC) 20 MG capsule Take 2 capsules (40 mg total) by mouth daily.  30 capsule  11  . pravastatin (PRAVACHOL) 40 MG tablet Take 1 tablet (40 mg total) by mouth daily.  30 tablet  3  . PRODIGY MINI PEN NEEDLES 31G X 5 MM MISC USE TO INJECT INSULIN THREE TIMES DAILY.  100 each  10  . promethazine (PHENERGAN) 12.5 MG tablet Take 1 tablet (12.5 mg total) by mouth every 8 (eight) hours as needed for nausea.  15 tablet  0  . promethazine (PHENERGAN) 12.5 MG tablet Take 1 tablet (12.5 mg total) by mouth every 6 (six) hours as needed for nausea.  30 tablet  3  . traMADol (ULTRAM) 50 MG tablet Take 1 tablet (50 mg total) by mouth every 6 (six) hours as needed for pain (for severe pain).  60 tablet  1  . DISCONTD: amitriptyline (ELAVIL) 25 MG tablet Take 1 tablet (25 mg total) by mouth at bedtime.  30 tablet  3  . DISCONTD: lisinopril (PRINIVIL,ZESTRIL) 20 MG tablet Take 1 tablet (20 mg total) by mouth daily.  30 tablet  11  . DISCONTD: omeprazole (PRILOSEC) 20 MG  capsule Take 1 capsule (20 mg total) by mouth daily.  30 capsule  6  . DISCONTD: promethazine (PHENERGAN) 12.5 MG tablet TAKE ONE TABLET BY MOUTH EVERY 6 HOURS AS NEEDED FOR NAUSEA  30 tablet  2   No family history on file. History   Social History  . Marital Status: Single    Spouse Name: N/A    Number of Children: N/A  . Years of Education: N/A   Social History Main Topics  . Smoking status: Former Smoker    Types: Cigarettes  . Smokeless tobacco: None  . Alcohol Use: No  . Drug Use: No  . Sexually Active: None   Other Topics Concern  . None   Social History Narrative  . None    Review of Systems: Constitutional: Denies fever, chills, diaphoresis, appetite change and fatigue.  HEENT: Denies photophobia, eye pain, redness, hearing loss, ear pain, congestion, sore throat, rhinorrhea, sneezing, mouth sores, trouble swallowing, neck  pain, neck stiffness and tinnitus.  Respiratory: Denies SOB, DOE, cough, chest tightness, and wheezing.  Cardiovascular: Denies chest pain, palpitations and leg swelling.  Gastrointestinal: Denies nausea, vomiting, abdominal pain, diarrhea, constipation, blood in stool and abdominal distention.  Genitourinary: Denies dysuria, urgency, frequency, hematuria, flank pain and difficulty urinating.  Musculoskeletal: Denies myalgias, back pain, joint swelling, arthralgias and gait problem.  Skin: Denies pallor, rash and wound.  Neurological: Denies dizziness, seizures, syncope, weakness, light-headedness, numbness and headaches.  Hematological: Denies adenopathy. Easy bruising, personal or family bleeding history  Psychiatric/Behavioral: Denies suicidal ideation, mood changes, confusion, nervousness, sleep disturbance and agitation   Vitals: reviewed General: alert, well-developed, and cooperative to examination.  Head: normocephalic and atraumatic.  Eyes: vision grossly intact, pupils equal, pupils round, pupils reactive to light, no injection and anicteric.  Mouth: pharynx pink and moist, no erythema, and no exudates.  Neck: supple, full ROM, no thyromegaly, no JVD, and no carotid bruits.  Lungs: normal respiratory effort, no accessory muscle use, normal breath sounds, no crackles, and no wheezes. Heart: normal rate, regular rhythm, no murmur, no gallop, and no rub.  Abdomen: soft, non-tender, normal bowel sounds, no distention, no guarding, no rebound tenderness, no hepatomegaly, and no splenomegaly.  Msk: no joint swelling, no joint warmth, and no redness over joints.  Pulses: 2+ DP/PT pulses bilaterally Extremities: No cyanosis, clubbing, edema Neurologic: alert & oriented X3, cranial nerves II-XII intact, strength normal in all extremities, sensation intact to light touch, and gait normal.  Skin: turgor normal and no rashes.  Psych: Oriented X3, memory intact for recent and remote, normally  interactive, good eye contact, not anxious appearing, and not depressed appearing.    Assessment & Plan:  1. LBP 2/2 MSK strain -declined to Rx opioids at this time. Needs to fail other therapies first -Continue with Tramadol -add [facility] derm TDS -Flexeryl PRN --cautioned against use with elavil due to risk of sedation. -PT eval and treatment  2. GERD -increase omeprazole 40 mg PO daily  3. HTN  -increase lisinopril up to 40 mg PO daily  4. DM, type 2 -HgbA1c -Cmet -lipids -flu shot

## 2011-04-16 LAB — LIPID PANEL
Cholesterol: 166 mg/dL (ref 0–200)
Total CHOL/HDL Ratio: 2.6 Ratio
Triglycerides: 99 mg/dL (ref <150)
VLDL: 20 mg/dL (ref 0–40)

## 2011-04-16 LAB — COMPREHENSIVE METABOLIC PANEL
BUN: 18 mg/dL (ref 6–23)
CO2: 26 mEq/L (ref 19–32)
Calcium: 10.4 mg/dL (ref 8.4–10.5)
Chloride: 101 mEq/L (ref 96–112)
Creat: 0.79 mg/dL (ref 0.50–1.10)
Glucose, Bld: 272 mg/dL — ABNORMAL HIGH (ref 70–99)
Total Bilirubin: 0.3 mg/dL (ref 0.3–1.2)

## 2011-05-02 ENCOUNTER — Ambulatory Visit: Payer: Medicare Other | Attending: Internal Medicine | Admitting: Physical Therapy

## 2011-05-04 ENCOUNTER — Telehealth: Payer: Self-pay | Admitting: *Deleted

## 2011-05-04 DIAGNOSIS — M7500 Adhesive capsulitis of unspecified shoulder: Secondary | ICD-10-CM

## 2011-05-04 MED ORDER — TRAMADOL HCL 50 MG PO TABS: 50.0000 mg | ORAL_TABLET | Freq: Four times a day (QID) | ORAL | Status: DC | PRN

## 2011-05-04 NOTE — Telephone Encounter (Signed)
Spoke with pt- she states her shoulder is bothering her again.  Advised she needs a follow up appointment for re-evaluation, but can send 30 tramadol to last until her appointment here.  Pt scheduled with Dr. Darrick Penna 05/23/11.

## 2011-05-04 NOTE — Telephone Encounter (Signed)
Message copied by Mora Bellman on Wed May 04, 2011  3:35 PM ------      Message from: CERESI, MELANIE L      Created: Wed May 04, 2011  2:28 PM      Regarding: tramdol refill      Contact: [phone]       [facility] [address]

## 2011-05-04 NOTE — Progress Notes (Signed)
Addended by: Dorie Rank E on: 05/04/2011 01:26 PM   Modules accepted: Orders

## 2011-05-12 ENCOUNTER — Encounter: Payer: Medicare Other | Admitting: Internal Medicine

## 2011-05-23 ENCOUNTER — Ambulatory Visit (INDEPENDENT_AMBULATORY_CARE_PROVIDER_SITE_OTHER): Payer: Medicare Other | Admitting: Sports Medicine

## 2011-05-23 DIAGNOSIS — M7500 Adhesive capsulitis of unspecified shoulder: Secondary | ICD-10-CM

## 2011-05-23 DIAGNOSIS — E119 Type 2 diabetes mellitus without complications: Secondary | ICD-10-CM

## 2011-05-23 DIAGNOSIS — F329 Major depressive disorder, single episode, unspecified: Secondary | ICD-10-CM

## 2011-05-23 DIAGNOSIS — F32A Depression, unspecified: Secondary | ICD-10-CM

## 2011-05-23 DIAGNOSIS — M545 Low back pain, unspecified: Secondary | ICD-10-CM

## 2011-05-23 DIAGNOSIS — K219 Gastro-esophageal reflux disease without esophagitis: Secondary | ICD-10-CM

## 2011-05-23 DIAGNOSIS — I10 Essential (primary) hypertension: Secondary | ICD-10-CM

## 2011-05-23 DIAGNOSIS — M7501 Adhesive capsulitis of right shoulder: Secondary | ICD-10-CM

## 2011-05-23 MED ORDER — AMITRIPTYLINE HCL 25 MG PO TABS: 25.0000 mg | ORAL_TABLET | Freq: Every day | ORAL | Status: DC

## 2011-05-23 MED ORDER — TRAMADOL HCL 50 MG PO TABS: 50.0000 mg | ORAL_TABLET | Freq: Four times a day (QID) | ORAL | Status: DC | PRN

## 2011-05-23 NOTE — Progress Notes (Signed)
  Subjective:    Patient ID: [name], female    DOB: Aug 03, 1960, 50 y.o.   MRN: [id number]  HPI 50 y/o diabetic female with a history of recurrent bilateral adhesive capsulitis is here with a flare of the right shoulder.  She still does her ROM exercises which have kept her left shoulder essentially symptom free.  The right shoulder is painful and stiff.  No trauma.   Review of Systems     Objective:   Physical Exam  Right shoulder: IR to iliac crest ER 30 degrees FF 100 degrees ABduction 90 degrees Pain with all motions but mostly with internal rotation and forward flexion Strength is difficult to assess with this amount of pain  Left shoulder: IR to T10 ER 45 degrees FF 150 ABduction 170  Consent obtained and verified. Cleansed with alcohol. Topical analgesic spray: Ethyl chloride. Joint: Right glenohumeral  Approached in typical fashion from the posterior approach Completed without difficulty Meds: 40 mg depo medrol: 6 ml of lidocaine without epinephrine Needle: 25 g Aftercare instructions and Red flags advised.      A1C is  9.0 on 04/15/11 Assessment & Plan:

## 2011-05-24 MED ORDER — METHYLPREDNISOLONE ACETATE 40 MG/ML IJ SUSP: 40.0000 mg | Freq: Once | INTRAMUSCULAR | Status: DC

## 2011-05-24 NOTE — Assessment & Plan Note (Signed)
Performed an injection today.  Renewed amitriptyline and tramadol for pain control.  She will continue with ROM exercises.  She will monitor her blood glucose closely and contact her primary care provider to if she notices a trend of significant increases.

## 2011-05-26 ENCOUNTER — Encounter: Payer: Medicare Other | Admitting: Internal Medicine

## 2011-05-26 ENCOUNTER — Other Ambulatory Visit: Payer: Self-pay | Admitting: Internal Medicine

## 2011-06-09 ENCOUNTER — Encounter: Payer: Self-pay | Admitting: Internal Medicine

## 2011-06-09 ENCOUNTER — Ambulatory Visit (INDEPENDENT_AMBULATORY_CARE_PROVIDER_SITE_OTHER): Payer: Medicare Other | Admitting: Internal Medicine

## 2011-06-09 DIAGNOSIS — I10 Essential (primary) hypertension: Secondary | ICD-10-CM

## 2011-06-09 DIAGNOSIS — M7500 Adhesive capsulitis of unspecified shoulder: Secondary | ICD-10-CM

## 2011-06-09 DIAGNOSIS — M549 Dorsalgia, unspecified: Secondary | ICD-10-CM

## 2011-06-09 DIAGNOSIS — E119 Type 2 diabetes mellitus without complications: Secondary | ICD-10-CM

## 2011-06-09 MED ORDER — CYCLOBENZAPRINE HCL 10 MG PO TABS: 10.0000 mg | ORAL_TABLET | Freq: Three times a day (TID) | ORAL | Status: DC | PRN

## 2011-06-09 MED ORDER — INSULIN GLARGINE 100 UNIT/ML ~~LOC~~ SOLN: 30.0000 [IU] | Freq: Every day | SUBCUTANEOUS | Status: DC

## 2011-06-09 MED ORDER — TRAMADOL HCL 50 MG PO TABS: 50.0000 mg | ORAL_TABLET | Freq: Four times a day (QID) | ORAL | Status: DC | PRN

## 2011-06-09 NOTE — Progress Notes (Signed)
Subjective:     Patient ID: [name], female   DOB: 10/02/1960, 50 y.o.   MRN: [id number]  HPI  Patient is a 50 year old female with a past medical history listed below, presents to outpatient clinic for followup of her back pain, patient reports her back pain started getting worse of September 2012, and has been progressively getting worse, denies any trauma or injury. The patient has spinal x-ray from September 2012 which shows mild degenerative disease without no significant changes. Patient has no reflex symptoms such as weight loss, fevers or chills. Would like refills on her medications, aspirin diabetes she states her sugars are persistently above 200. No other complaints  Patient Active Problem List  Diagnoses  . SEXUALLY TRANSMITTED DISEASE  . DIABETES MELLITUS, TYPE II  . HYPERLIPIDEMIA  . OBESITY  . UNSPECIFIED ANEMIA  . LEUKOCYTOSIS UNSPECIFIED  . PERIPHERAL NEUROPATHY  . HYPERTENSION  . GERD  . ADHESIVE CAPSULITIS, LEFT  . ANKLE PAIN, LEFT  . Health maintenance examination  . Adhesive capsulitis of right shoulder  . Chest pain  . Nausea  . Back pain   Current Outpatient Prescriptions on File Prior to Visit  Medication Sig Dispense Refill  . amitriptyline (ELAVIL) 25 MG tablet Take 1 tablet (25 mg total) by mouth at bedtime.  30 tablet  3  . gabapentin (NEURONTIN) 600 MG tablet Take 1 tablet (600 mg total) by mouth 3 (three) times daily.  90 tablet  3  . gabapentin (NEURONTIN) 600 MG tablet Take 1 tablet (600 mg total) by mouth 3 (three) times daily.  90 tablet  5  . insulin aspart (NOVOLOG FLEXPEN) 100 UNIT/ML injection Inject 3 units into the skin 10 mins before breakfast, 2 units 10 mins before lunch and 5 units 10 mins before dinner  10 mL  12  . lidocaine (LIDODERM) 5 % Place 1 patch onto the skin every 12 (twelve) hours. Remove & Discard patch within 12 hours or as directed by MD  10 patch  3  . lisinopril (PRINIVIL,ZESTRIL) 20 MG tablet Take 2 tablets (40 mg  total) by mouth daily.  30 tablet  11  . metFORMIN (GLUCOPHAGE) 1000 MG tablet Take 1 tablet (1,000 mg total) by mouth 2 (two) times daily with a meal.  60 tablet  5  . omeprazole (PRILOSEC) 20 MG capsule Take 2 capsules (40 mg total) by mouth daily.  30 capsule  11  . pravastatin (PRAVACHOL) 40 MG tablet TAKE ONE TABLET BY MOUTH EVERY DAY  30 tablet  6  . PRODIGY MINI PEN NEEDLES 31G X 5 MM MISC USE TO INJECT INSULIN THREE TIMES DAILY.  100 each  10  . promethazine (PHENERGAN) 12.5 MG tablet Take 1 tablet (12.5 mg total) by mouth every 6 (six) hours as needed for nausea.  30 tablet  3  . promethazine (PHENERGAN) 12.5 MG tablet Take 12.5 mg by mouth every 8 (eight) hours as needed.        Marland Kitchen DISCONTD: insulin glargine (LANTUS) 100 UNIT/ML injection Inject 25 Units into the skin at bedtime.  10 mL  12  . DISCONTD: promethazine (PHENERGAN) 12.5 MG tablet Take 1 tablet (12.5 mg total) by mouth every 8 (eight) hours as needed for nausea.  15 tablet  0   Current Facility-Administered Medications on File Prior to Visit  Medication Dose Route Frequency Provider Last Rate Last Dose  . methylPREDNISolone acetate (DEPO-MEDROL) injection 40 mg  40 mg Intra-articular Once Glo Herring, MD  No Known Allergies   Review of Systems  Musculoskeletal: Positive for back pain.  All other systems reviewed and are negative.       Objective:   Physical Exam  Nursing note and vitals reviewed. Constitutional: She is oriented to person, place, and time. She appears well-developed and well-nourished.  HENT:  Head: Normocephalic and atraumatic.  Eyes: Pupils are equal, round, and reactive to light.  Neck: Normal range of motion. Neck supple. No JVD present. No thyromegaly present.  Cardiovascular: Normal rate, regular rhythm and normal heart sounds.   No murmur heard. Pulmonary/Chest: Effort normal and breath sounds normal. She has no wheezes. She has no rales.  Abdominal: Soft. Bowel sounds are  normal.  Musculoskeletal: Normal range of motion. She exhibits no edema.  Neurological: She is alert and oriented to person, place, and time.       Straight leg raise negative,   Skin: Skin is warm and dry.

## 2011-06-09 NOTE — Assessment & Plan Note (Signed)
Well controlled on current treatment, No new changes made today, Will continue to monitor.   

## 2011-06-09 NOTE — Patient Instructions (Addendum)
I have started her on Flexeril for your back pain please take as instructed, I have also refilled her tramadol and increased her Lantus to 30 units at bedtime.    Back Pain, Adult Low back pain is very common. About 1 in 5 people have back pain.The cause of low back pain is rarely dangerous. The pain often gets better over time.About half of people with a sudden onset of back pain feel better in just 2 weeks. About 8 in 10 people feel better by 6 weeks.  CAUSES Some common causes of back pain include:  Strain of the muscles or ligaments supporting the spine.   Wear and tear (degeneration) of the spinal discs.   Arthritis.   Direct injury to the back.  DIAGNOSIS Most of the time, the direct cause of low back pain is not known.However, back pain can be treated effectively even when the exact cause of the pain is unknown.Answering your caregiver's questions about your overall health and symptoms is one of the most accurate ways to make sure the cause of your pain is not dangerous. If your caregiver needs more information, he or she may order lab work or imaging tests (X-rays or MRIs).However, even if imaging tests show changes in your back, this usually does not require surgery. HOME CARE INSTRUCTIONS For many people, back pain returns.Since low back pain is rarely dangerous, it is often a condition that people can learn to [facility] their own.   Remain active. It is stressful on the back to sit or stand in one place. Do not sit, drive, or stand in one place for more than 30 minutes at a time. Take short walks on level surfaces as soon as pain allows.Try to increase the length of time you walk each day.   Do not stay in bed.Resting more than 1 or 2 days can delay your recovery.   Do not avoid exercise or work.Your body is made to move.It is not dangerous to be active, even though your back may hurt.Your back will likely heal faster if you return to being active before your pain is  gone.   Pay attention to your body when you bend and lift. Many people have less discomfortwhen lifting if they bend their knees, keep the load close to their bodies,and avoid twisting. Often, the most comfortable positions are those that put less stress on your recovering back.   Find a comfortable position to sleep. Use a firm mattress and lie on your side with your knees slightly bent. If you lie on your back, put a pillow under your knees.   Only take over-the-counter or prescription medicines as directed by your caregiver. Over-the-counter medicines to reduce pain and inflammation are often the most helpful.Your caregiver may prescribe muscle relaxant drugs.These medicines help dull your pain so you can more quickly return to your normal activities and healthy exercise.   Put ice on the injured area.   Put ice in a plastic bag.   Place a towel between your skin and the bag.   Leave the ice on for 15 to 20 minutes, 3 to 4 times a day for the first 2 to 3 days. After that, ice and heat may be alternated to reduce pain and spasms.   Ask your caregiver about trying back exercises and gentle massage. This may be of some benefit.   Avoid feeling anxious or stressed.Stress increases muscle tension and can worsen back pain.It is important to recognize when you are anxious or  stressed and learn ways to manage it.Exercise is a great option.  SEEK MEDICAL CARE IF:  You have pain that is not relieved with rest or medicine.   You have pain that does not improve in 1 week.   You have new symptoms.   You are generally not feeling well.  SEEK IMMEDIATE MEDICAL CARE IF:   You have pain that radiates from your back into your legs.   You develop new bowel or bladder control problems.   You have unusual weakness or numbness in your arms or legs.   You develop nausea or vomiting.   You develop abdominal pain.   You feel faint.  Document Released: 06/13/2005 Document Revised:  02/23/2011 Document Reviewed: 11/01/2010 [facility] Patient Information 2012 [place], [place].

## 2011-06-09 NOTE — Assessment & Plan Note (Addendum)
Progressively getting worse in September, no aggravating factor, no trauma noted Spine xray 03/25/11 shows mild degenerative disc disease and spondylosis at L5-S1. Mild spondylosis elsewhere in the lumbar spine and lower thoracic spine. Patient complains of stiffness predominance especially in the morning which may be making the pain worse. We'll start the patient on Flexeril 10 mg every 6 hours as needed. Also refilled her tramadol. Given the patient's ongoing now for the past several months we'll order a spinal MRI to rule out any significant pathology.

## 2011-06-09 NOTE — Assessment & Plan Note (Signed)
Not due for an A1c, last A1c was 9 in October 2012, sugars are consistently above 200, will see increase Lantus to 30 units at bedtime, and bring back in one month for an A1c checked.

## 2011-06-10 NOTE — Progress Notes (Signed)
Pt aware by home phone recording x 2 MRI sch [facility] 06/16/11 1PM. Dayami Taitt RN 06/10/11 10AM

## 2011-06-16 ENCOUNTER — Inpatient Hospital Stay (HOSPITAL_COMMUNITY): Admission: RE | Admit: 2011-06-16 | Payer: Medicare Other | Source: Ambulatory Visit

## 2011-06-30 ENCOUNTER — Ambulatory Visit: Payer: Medicare Other | Admitting: Sports Medicine

## 2011-07-13 ENCOUNTER — Ambulatory Visit (INDEPENDENT_AMBULATORY_CARE_PROVIDER_SITE_OTHER): Payer: Medicare Other | Admitting: Internal Medicine

## 2011-07-13 ENCOUNTER — Encounter: Payer: Self-pay | Admitting: Gastroenterology

## 2011-07-13 ENCOUNTER — Encounter: Payer: Self-pay | Admitting: Internal Medicine

## 2011-07-13 VITALS — BP 124/76 | HR 91 | Temp 97.7°F | Ht 66.0 in | Wt 255.2 lb

## 2011-07-13 DIAGNOSIS — M7500 Adhesive capsulitis of unspecified shoulder: Secondary | ICD-10-CM

## 2011-07-13 DIAGNOSIS — Z299 Encounter for prophylactic measures, unspecified: Secondary | ICD-10-CM

## 2011-07-13 DIAGNOSIS — Z0000 Encounter for general adult medical examination without abnormal findings: Secondary | ICD-10-CM | POA: Insufficient documentation

## 2011-07-13 DIAGNOSIS — I10 Essential (primary) hypertension: Secondary | ICD-10-CM

## 2011-07-13 DIAGNOSIS — E119 Type 2 diabetes mellitus without complications: Secondary | ICD-10-CM

## 2011-07-13 DIAGNOSIS — R197 Diarrhea, unspecified: Secondary | ICD-10-CM

## 2011-07-13 DIAGNOSIS — M549 Dorsalgia, unspecified: Secondary | ICD-10-CM

## 2011-07-13 DIAGNOSIS — M7989 Other specified soft tissue disorders: Secondary | ICD-10-CM

## 2011-07-13 LAB — POCT GLYCOSYLATED HEMOGLOBIN (HGB A1C): Hemoglobin A1C: 8.9

## 2011-07-13 MED ORDER — TRAMADOL HCL 50 MG PO TABS: 50.0000 mg | ORAL_TABLET | Freq: Every day | ORAL | Status: DC | PRN

## 2011-07-13 MED ORDER — INSULIN GLARGINE 100 UNIT/ML ~~LOC~~ SOLN: 35.0000 [IU] | Freq: Every day | SUBCUTANEOUS | Status: DC

## 2011-07-13 NOTE — Assessment & Plan Note (Signed)
Unclear etiology at this point. DD include renal dysfunction, venous insufficiency or cardiac origin. I will obtain first a Bmet and evaluate renal function if normal likely will obtain 2 D echo since patient has risk factors for cardiomyopathy.

## 2011-07-13 NOTE — Assessment & Plan Note (Signed)
Blood pressure well controlled. Continue current regimen. BP Readings from Last 3 Encounters:  07/13/11 124/76  06/09/11 129/72  05/23/11 133/82

## 2011-07-13 NOTE — Progress Notes (Signed)
  Subjective:   Patient ID: [name] female   DOB: [birth year] 51 y.o.   MRN: [id number]  HPI: [name] is a 51 y.o. female with PMH significant as outlined below who presented to the clinic for multiple problem.  1. Back pain: Patient has long history of back pain and was advised to get an MRI in 05/2011 but she missed her appointment since patient noted she was sick. Patient noted that she need refill on Tramadol for he pain. She denies any fevers or chill, changes in bowel movement , weakness in the leg but occasionally some pain radiating down the leg.  2. Diarrhea ; non- bloody, 2-3 BM a day for multiple month but now worsening. No changes in diet, change in medication, no recent travel. Has been taking Metformin for years. Had never had a colonoscopy.   3. Leg swelling: Reports it started a couple of weeks ago . She has tried to elevated it but now it is not improving. She does not eat to much salt.   4. DM; Patient did not bring in the meter .     Past Medical History  Diagnosis Date  . Diabetes mellitus   . Hypertension   . Hyperlipidemia   . GERD (gastroesophageal reflux disease)   . Adhesive capsulitis of shoulder     bilateral, Steroid injection Dr. Nedra Hai 1/12 bilaterally  . Peripheral neuropathy   . Adhesive capsulitis of right shoulder     Steroid injection Dr. Nedra Hai 1/12  . Adhesive capsulitis of left shoulder     Steroid injection Dr. Nedra Hai 1/12  . Obesity   . Diabetic retinopathy   . Glaucoma    Review of Systems: Constitutional: Denies fever, chills, diaphoresis, appetite change and fatigue.  Respiratory: Denies SOB, DOE, cough, chest tightness,  and wheezing.   Cardiovascular: Denies chest pain, palpitations aGastrointestinal: Denies nausea, vomiting, abdominal pain, , constipation, blood in stool and abdominal distention.  Genitourinary: Denies dysuria, urgency, frequency, hematuria, flank pain and difficulty urinating.  Skin: Denies pallor, rash and  wound.  Neurological: Denies dizziness,  light-headedness,   Objective:  Physical Exam: Filed Vitals:   07/13/11 0937  BP: 124/76  Pulse: 91  Temp: 97.7 F (36.5 C)  TempSrc: Oral  Height: 5\' 6"  (1.676 m)  Weight: 255 lb 3.2 oz (115.758 kg)  SpO2: 100%   Constitutional: Vital signs reviewed.  Patient is a well-developed and well-nourished  in no acute distress and cooperative with exam. Alert and oriented x3.   Neck: Supple,  Cardiovascular: RRR, S1 normal, S2 normal, no MRG, pulses symmetric and intact bilaterally. 2+ pitting edema from ankle to knee Pulmonary/Chest: CTAB, no wheezes, rales, or rhonchi Abdominal: Soft. Non-tender, non-distended, bowel sounds are normal, GU: no CVA tenderness Musculoskeletal: Lumbar spine: tender to palpation paraspinal. No erythema, no rash. Mild decreased ROM due to pain but patient is distractible.  Neurological: A&O x3, Strenght is normal and symmetric bilaterally, no focal motor deficit, sensory intact to light touch bilaterally.  Skin: Warm, dry and intact. No rash, cyanosis, or clubbing.

## 2011-07-13 NOTE — Assessment & Plan Note (Signed)
Will refer patient for Colonoscopy.

## 2011-07-13 NOTE — Patient Instructions (Addendum)
1. I have increased her Lantus to 35 units daily  2. Please check your blood sugars at least once a day before breakfast. Please bring in your meter to the next office visit. 3. Please eat a snack before he goes to bed. 4. Please bring all your medication with you during the next office visit.

## 2011-07-13 NOTE — Assessment & Plan Note (Signed)
No significant changes since last office visit. I will increase Lantus to 35 units daily. I further referred patient for Diabetes education since it is seems that patient has not clear understanding about her disease. Patient was advised to check her sugars on a daily basis before breakfast and bring in the meter in during the next office visit.

## 2011-07-13 NOTE — Assessment & Plan Note (Signed)
I have not evaluated extensively since  multiple problems had to be addressed during this visit. The diarrhea may be due to Metformin but would evaluated during next office visit. I am referring patient for regular screening colonoscopy at this point.

## 2011-07-14 ENCOUNTER — Telehealth: Payer: Self-pay | Admitting: *Deleted

## 2011-07-14 LAB — BASIC METABOLIC PANEL
CO2: 25 mEq/L (ref 19–32)
Calcium: 9.1 mg/dL (ref 8.4–10.5)
Creat: 0.79 mg/dL (ref 0.50–1.10)
Sodium: 141 mEq/L (ref 135–145)

## 2011-07-14 NOTE — Telephone Encounter (Signed)
Pt calls and states she just can't figure out what is going on, her toes are swollen this am, otherwise feels the same, f/u appt given for tues 1/22 at 1415, she is informed that if she becomes more concerned or worse between now and appt she may go to urg care, she is agreeable

## 2011-07-19 ENCOUNTER — Ambulatory Visit: Payer: Medicare Other | Admitting: Internal Medicine

## 2011-07-20 ENCOUNTER — Ambulatory Visit (HOSPITAL_COMMUNITY)
Admission: RE | Admit: 2011-07-20 | Discharge: 2011-07-20 | Disposition: A | Discharge status: Home / Self Care | Payer: Medicare Other | Source: Ambulatory Visit | Attending: Internal Medicine | Admitting: Internal Medicine

## 2011-07-20 DIAGNOSIS — M5126 Other intervertebral disc displacement, lumbar region: Secondary | ICD-10-CM | POA: Insufficient documentation

## 2011-07-20 DIAGNOSIS — M549 Dorsalgia, unspecified: Secondary | ICD-10-CM

## 2011-07-20 DIAGNOSIS — M47817 Spondylosis without myelopathy or radiculopathy, lumbosacral region: Secondary | ICD-10-CM | POA: Insufficient documentation

## 2011-07-22 ENCOUNTER — Telehealth: Payer: Self-pay | Admitting: *Deleted

## 2011-07-22 NOTE — Telephone Encounter (Signed)
MRI result given to pt per Dr. Bosie Clos.

## 2011-07-22 NOTE — Telephone Encounter (Signed)
Message copied by Hassan Buckler on Fri Jul 22, 2011 10:31 AM ------      Message from: Hector Shade      Created: Wed Jul 20, 2011  6:23 PM      Regarding: MRI results       I have never examined this patient. Please inform her that MRI showed arthritis and mild bulging of a disc in her lower spine which is NOT pressing on a nerve.            Kristie Cowman, MD      ----- Message -----         From: Hassan Buckler, RN         Sent: 07/20/2011   2:47 PM           To: Kristie Cowman, MD            Dr Blanchard Kelch called; wants to know MRI result (ordered by Dr. Gilford Rile) [id number].      Thanks  [employer]

## 2011-07-25 ENCOUNTER — Ambulatory Visit (AMBULATORY_SURGERY_CENTER): Payer: Medicare Other | Admitting: *Deleted

## 2011-07-25 VITALS — Ht 66.0 in | Wt 249.0 lb

## 2011-07-25 DIAGNOSIS — Z1211 Encounter for screening for malignant neoplasm of colon: Secondary | ICD-10-CM

## 2011-07-25 MED ORDER — PEG-KCL-NACL-NASULF-NA ASC-C 100 G PO SOLR: ORAL | Status: DC

## 2011-07-29 ENCOUNTER — Encounter: Payer: Self-pay | Admitting: Internal Medicine

## 2011-07-29 ENCOUNTER — Ambulatory Visit (INDEPENDENT_AMBULATORY_CARE_PROVIDER_SITE_OTHER): Payer: Medicare Other | Admitting: Internal Medicine

## 2011-07-29 DIAGNOSIS — M7500 Adhesive capsulitis of unspecified shoulder: Secondary | ICD-10-CM

## 2011-07-29 DIAGNOSIS — I10 Essential (primary) hypertension: Secondary | ICD-10-CM

## 2011-07-29 DIAGNOSIS — M549 Dorsalgia, unspecified: Secondary | ICD-10-CM

## 2011-07-29 MED ORDER — CYCLOBENZAPRINE HCL 10 MG PO TABS: 10.0000 mg | ORAL_TABLET | Freq: Three times a day (TID) | ORAL | Status: DC | PRN

## 2011-07-29 MED ORDER — TRAMADOL HCL 50 MG PO TABS: 50.0000 mg | ORAL_TABLET | Freq: Every day | ORAL | Status: DC | PRN

## 2011-07-29 NOTE — Progress Notes (Signed)
  Subjective:    Patient ID: [name], female    DOB: October 04, 1960, 51 y.o.   MRN: [id number]  HPI: 51 year old woman with past medical history significant for diabetes, hypertension, hyperlipidemia comes to the clinic for a followup on her back pain.  She states having back pain for several months but it has been more worse for last 1 month. Patient recently had a MRI and wanted to go over the results.  She states that she has started walking her dog which helps her a little bit but at some days her back pain is very severe and interferes with her activity.  She describes her pain as sharp, located in the lower back radiating to both the legs when its severe. She states that the Flexeril and tramadol helps her with the pain. Denies any systemic symptoms including fever, weight loss, weakness or numbness associated with it.    Review of Systems  Constitutional: Negative for fever, activity change and appetite change.  HENT: Negative for nosebleeds, congestion, rhinorrhea, sneezing and neck pain.   Eyes: Negative for photophobia, pain, redness and visual disturbance.  Respiratory: Negative for apnea, choking and chest tightness.   Cardiovascular: Negative for chest pain, palpitations and leg swelling.  Gastrointestinal: Negative for nausea, vomiting and abdominal pain.  Genitourinary: Negative for urgency, flank pain and difficulty urinating.  Musculoskeletal: Positive for back pain. Negative for joint swelling, arthralgias and gait problem.  Neurological: Negative for dizziness, seizures, facial asymmetry and speech difficulty.  Hematological: Negative for adenopathy.  Psychiatric/Behavioral: Negative for agitation.       Objective:   Physical Exam  Constitutional: She is oriented to person, place, and time. She appears well-developed and well-nourished. No distress.  HENT:  Head: Normocephalic and atraumatic.  Mouth/Throat: No oropharyngeal exudate.  Eyes: Conjunctivae and EOM  are normal. Pupils are equal, round, and reactive to light. Right eye exhibits no discharge. Left eye exhibits no discharge.  Neck: Normal range of motion. Neck supple. No JVD present. No tracheal deviation present. No thyromegaly present.  Cardiovascular: Normal rate, regular rhythm, normal heart sounds and intact distal pulses.  Exam reveals no gallop and no friction rub.   No murmur heard. Pulmonary/Chest: Effort normal and breath sounds normal. No stridor. No respiratory distress. She has no wheezes. She has no rales. She exhibits no tenderness.  Abdominal: Soft. Bowel sounds are normal. She exhibits no distension and no mass. There is no tenderness. There is no rebound and no guarding.  Musculoskeletal: Normal range of motion. She exhibits no edema and no tenderness.  Lymphadenopathy:    She has no cervical adenopathy.  Neurological: She is alert and oriented to person, place, and time. She has normal reflexes. She displays normal reflexes. No cranial nerve deficit. Coordination normal.       Motor strength is 5/5 in all four extremities, sensations intact to light touch.  Skin: Skin is warm. No rash noted. She is not diaphoretic. No erythema. No pallor.          Assessment & Plan:

## 2011-07-29 NOTE — Patient Instructions (Signed)
Please schedule a follow up appointment with your PCP in 2 months. We would refer you physical therapy for your back pain. Back Pain, Adult Low back pain is very common. About 1 in 5 people have back pain.The cause of low back pain is rarely dangerous. The pain often gets better over time.About half of people with a sudden onset of back pain feel better in just 2 weeks. About 8 in 10 people feel better by 6 weeks.  CAUSES Some common causes of back pain include:  Strain of the muscles or ligaments supporting the spine.   Wear and tear (degeneration) of the spinal discs.   Arthritis.   Direct injury to the back.  DIAGNOSIS Most of the time, the direct cause of low back pain is not known.However, back pain can be treated effectively even when the exact cause of the pain is unknown.Answering your caregiver's questions about your overall health and symptoms is one of the most accurate ways to make sure the cause of your pain is not dangerous. If your caregiver needs more information, he or she may order lab work or imaging tests (X-rays or MRIs).However, even if imaging tests show changes in your back, this usually does not require surgery. HOME CARE INSTRUCTIONS For many people, back pain returns.Since low back pain is rarely dangerous, it is often a condition that people can learn to [name] their own.   Remain active. It is stressful on the back to sit or stand in one place. Do not sit, drive, or stand in one place for more than 30 minutes at a time. Take short walks on level surfaces as soon as pain allows.Try to increase the length of time you walk each day.   Do not stay in bed.Resting more than 1 or 2 days can delay your recovery.   Do not avoid exercise or work.Your body is made to move.It is not dangerous to be active, even though your back may hurt.Your back will likely heal faster if you return to being active before your pain is gone.   Pay attention to your body when  you bend and lift. Many people have less discomfortwhen lifting if they bend their knees, keep the load close to their bodies,and avoid twisting. Often, the most comfortable positions are those that put less stress on your recovering back.   Find a comfortable position to sleep. Use a firm mattress and lie on your side with your knees slightly bent. If you lie on your back, put a pillow under your knees.   Only take over-the-counter or prescription medicines as directed by your caregiver. Over-the-counter medicines to reduce pain and inflammation are often the most helpful.Your caregiver may prescribe muscle relaxant drugs.These medicines help dull your pain so you can more quickly return to your normal activities and healthy exercise.   Put ice on the injured area.   Put ice in a plastic bag.   Place a towel between your skin and the bag.   Leave the ice on for 15 to 20 minutes, 3 to 4 times a day for the first 2 to 3 days. After that, ice and heat may be alternated to reduce pain and spasms.   Ask your caregiver about trying back exercises and gentle massage. This may be of some benefit.   Avoid feeling anxious or stressed.Stress increases muscle tension and can worsen back pain.It is important to recognize when you are anxious or stressed and learn ways to manage it.Exercise is a great  option.  SEEK MEDICAL CARE IF:  You have pain that is not relieved with rest or medicine.   You have pain that does not improve in 1 week.   You have new symptoms.   You are generally not feeling well.  SEEK IMMEDIATE MEDICAL CARE IF:   You have pain that radiates from your back into your legs.   You develop new bowel or bladder control problems.   You have unusual weakness or numbness in your arms or legs.   You develop nausea or vomiting.   You develop abdominal pain.   You feel faint.  Document Released: 06/13/2005 Document Revised: 02/23/2011 Document Reviewed:  11/01/2010 [facility] Patient Information 2012 [place], [place].

## 2011-07-29 NOTE — Assessment & Plan Note (Signed)
Lab Results  Component Value Date   NA 141 07/13/2011   K 4.7 07/13/2011   CL 105 07/13/2011   CO2 25 07/13/2011   BUN 14 07/13/2011   CREATININE 0.79 07/13/2011   CREATININE 0.85 02/09/2011    BP Readings from Last 3 Encounters:  07/29/11 141/86  07/13/11 124/76  06/09/11 129/72    Assessment: Hypertension control:  controlled  Progress toward goals:  deteriorated Barriers to meeting goals:  no barriers identified  Plan: Hypertension treatment:  continue current medications

## 2011-07-29 NOTE — Assessment & Plan Note (Signed)
Patient presents with back pain for a few months that has been worse for the last one month. She recently had an MRI on 07/20/2011 and wanted to go over the results. Patient was explained that she was found to have some arthritis and small disc protrusion at L5- S1,  but no evidence of neural compression. - Physical therapy referral. - Symptomatic treatment with tramadol and Flexeril that has helped her in the past. She was explained the harmful effects of the above medications and was advised to use it only when the pain is intolerable. - She was alos advised to use heating or cold whichever helps [place] when the pain is severe.

## 2011-08-02 LAB — GLUCOSE, CAPILLARY: Glucose-Capillary: 293 mg/dL — ABNORMAL HIGH (ref 70–99)

## 2011-08-08 ENCOUNTER — Encounter: Payer: Medicare Other | Admitting: Gastroenterology

## 2011-08-08 ENCOUNTER — Telehealth: Payer: Self-pay | Admitting: Gastroenterology

## 2011-08-08 NOTE — Progress Notes (Signed)
Addended by: Neomia Dear on: 08/08/2011 06:09 PM   Modules accepted: Orders

## 2011-08-08 NOTE — Telephone Encounter (Signed)
Yes.  She should have an OV if she has further questions.

## 2011-08-10 ENCOUNTER — Ambulatory Visit: Payer: Medicare Other | Attending: Internal Medicine | Admitting: Physical Therapy

## 2011-08-10 NOTE — Progress Notes (Signed)
Addended by: Neomia Dear on: 08/10/2011 07:06 PM   Modules accepted: Orders

## 2011-08-29 ENCOUNTER — Ambulatory Visit: Payer: Medicare Other | Admitting: Internal Medicine

## 2011-08-29 ENCOUNTER — Ambulatory Visit: Payer: Medicare Other | Admitting: Dietician

## 2011-08-29 ENCOUNTER — Encounter: Payer: Medicare Other | Admitting: Dietician

## 2011-09-01 NOTE — Progress Notes (Signed)
Addended by: Neomia Dear on: 09/01/2011 05:01 PM   Modules accepted: Orders

## 2011-09-22 ENCOUNTER — Other Ambulatory Visit: Payer: Self-pay | Admitting: *Deleted

## 2011-09-22 DIAGNOSIS — F32A Depression, unspecified: Secondary | ICD-10-CM

## 2011-09-22 DIAGNOSIS — M545 Low back pain, unspecified: Secondary | ICD-10-CM

## 2011-09-22 DIAGNOSIS — F329 Major depressive disorder, single episode, unspecified: Secondary | ICD-10-CM

## 2011-09-22 DIAGNOSIS — K219 Gastro-esophageal reflux disease without esophagitis: Secondary | ICD-10-CM

## 2011-09-22 DIAGNOSIS — I10 Essential (primary) hypertension: Secondary | ICD-10-CM

## 2011-09-22 DIAGNOSIS — M549 Dorsalgia, unspecified: Secondary | ICD-10-CM

## 2011-09-22 DIAGNOSIS — M7500 Adhesive capsulitis of unspecified shoulder: Secondary | ICD-10-CM

## 2011-09-22 DIAGNOSIS — E119 Type 2 diabetes mellitus without complications: Secondary | ICD-10-CM

## 2011-09-22 MED ORDER — TRAMADOL HCL 50 MG PO TABS: 50.0000 mg | ORAL_TABLET | Freq: Four times a day (QID) | ORAL | Status: DC | PRN

## 2011-09-22 MED ORDER — AMITRIPTYLINE HCL 25 MG PO TABS: 25.0000 mg | ORAL_TABLET | Freq: Every day | ORAL | Status: DC

## 2011-09-22 NOTE — Progress Notes (Signed)
Advised pt to schedule appt before further refills.

## 2011-09-26 ENCOUNTER — Other Ambulatory Visit: Payer: Self-pay | Admitting: *Deleted

## 2011-09-26 DIAGNOSIS — K219 Gastro-esophageal reflux disease without esophagitis: Secondary | ICD-10-CM

## 2011-09-26 DIAGNOSIS — F329 Major depressive disorder, single episode, unspecified: Secondary | ICD-10-CM

## 2011-09-26 DIAGNOSIS — M545 Low back pain, unspecified: Secondary | ICD-10-CM

## 2011-09-26 DIAGNOSIS — F32A Depression, unspecified: Secondary | ICD-10-CM

## 2011-09-26 DIAGNOSIS — I10 Essential (primary) hypertension: Secondary | ICD-10-CM

## 2011-09-26 DIAGNOSIS — E119 Type 2 diabetes mellitus without complications: Secondary | ICD-10-CM

## 2011-09-26 DIAGNOSIS — M7500 Adhesive capsulitis of unspecified shoulder: Secondary | ICD-10-CM

## 2011-09-26 MED ORDER — AMITRIPTYLINE HCL 25 MG PO TABS: 25.0000 mg | ORAL_TABLET | Freq: Every day | ORAL | Status: DC

## 2011-09-26 NOTE — Telephone Encounter (Signed)
Rx called to pharmacy

## 2011-09-27 MED ORDER — INSULIN ASPART 100 UNIT/ML ~~LOC~~ SOLN: SUBCUTANEOUS | Status: DC

## 2011-09-30 ENCOUNTER — Other Ambulatory Visit: Payer: Self-pay | Admitting: *Deleted

## 2011-09-30 DIAGNOSIS — M549 Dorsalgia, unspecified: Secondary | ICD-10-CM

## 2011-10-01 MED ORDER — CYCLOBENZAPRINE HCL 10 MG PO TABS: 10.0000 mg | ORAL_TABLET | Freq: Three times a day (TID) | ORAL | Status: DC | PRN

## 2011-10-03 ENCOUNTER — Ambulatory Visit (INDEPENDENT_AMBULATORY_CARE_PROVIDER_SITE_OTHER): Payer: Medicare Other | Admitting: Sports Medicine

## 2011-10-03 VITALS — BP 124/84

## 2011-10-03 DIAGNOSIS — M7520 Bicipital tendinitis, unspecified shoulder: Secondary | ICD-10-CM

## 2011-10-03 MED ORDER — NAPROXEN 375 MG PO TABS: 375.0000 mg | ORAL_TABLET | Freq: Two times a day (BID) | ORAL | Status: DC

## 2011-10-03 NOTE — Patient Instructions (Signed)
1. Do 3 sets of 10 bicep curls a day.  2. Take your naprosyn.  This treats inflammation of your biceps tendon.  3. Follow with [place] in 3 weeks.

## 2011-10-03 NOTE — Progress Notes (Signed)
  Subjective:    Patient ID: [name], female    DOB: 10-Aug-1960, 51 y.o.   MRN: [id number]  HPI 51 y/o female is here with a return of her left shoulder pain.  The is worse with reaching out for things or at night.  She takes tramadol which helps the pain.  The pain is mostly anterior.  She does have some posterior pain occasionally.  No new injury.   Review of Systems     Objective:   Physical Exam  Shoulder: Inspection reveals no abnormalities, atrophy or asymmetry. Tenderness to palpation over the biceps tendon ROM is full in all planes. Rotator cuff strength normal throughout. Equivocal impingement testing Speeds and Yergason's tests are positive No painful arc and no drop arm sign.       Assessment & Plan:

## 2011-10-05 DIAGNOSIS — M7520 Bicipital tendinitis, unspecified shoulder: Secondary | ICD-10-CM | POA: Insufficient documentation

## 2011-10-05 NOTE — Assessment & Plan Note (Addendum)
We will treat this conservatively for now with naprosyn,  a HEP, and modified activity.  If she continues to have pain we will ultrasound on follow up to rule out a split in the tendon.

## 2011-10-19 ENCOUNTER — Ambulatory Visit (INDEPENDENT_AMBULATORY_CARE_PROVIDER_SITE_OTHER): Payer: Medicare Other | Admitting: Internal Medicine

## 2011-10-19 ENCOUNTER — Encounter: Payer: Self-pay | Admitting: Internal Medicine

## 2011-10-19 VITALS — BP 147/83 | HR 97 | Temp 97.6°F | Ht 66.0 in | Wt 252.9 lb

## 2011-10-19 DIAGNOSIS — M549 Dorsalgia, unspecified: Secondary | ICD-10-CM

## 2011-10-19 DIAGNOSIS — E785 Hyperlipidemia, unspecified: Secondary | ICD-10-CM

## 2011-10-19 DIAGNOSIS — E119 Type 2 diabetes mellitus without complications: Secondary | ICD-10-CM

## 2011-10-19 MED ORDER — TRAMADOL HCL 50 MG PO TABS: 50.0000 mg | ORAL_TABLET | Freq: Four times a day (QID) | ORAL | Status: DC | PRN

## 2011-10-19 NOTE — Progress Notes (Signed)
Subjective:     Patient ID: [name], female   DOB: Jan 19, 1961, 51 y.o.   MRN: [id number]  HPI Patient presents for refill of medications his 51 year old was history significant for hypertension, diabetes, hyperlipidemia, and chronic back pain. Reports that she's been doing well although everyone in her household has had a "chest cold". She reports she continues to have an occasional cough but denies fever, increased shortness of breath or chest pain.  Review of Systems  Constitutional: Negative for fever and fatigue.  HENT: Positive for congestion.   Eyes: Negative for visual disturbance.  Respiratory: Negative for shortness of breath.   Cardiovascular: Negative for chest pain.  Genitourinary: Negative for dysuria.  Musculoskeletal: Positive for arthralgias.       Left shoulder  Neurological: Negative for weakness.       Objective:   Physical Exam  Constitutional: She is oriented to person, place, and time. She appears well-developed and well-nourished. No distress.  HENT:  Head: Normocephalic and atraumatic.  Eyes: Conjunctivae are normal. Pupils are equal, round, and reactive to light.  Neck: Normal range of motion. Neck supple.  Cardiovascular: Normal rate, regular rhythm, normal heart sounds and intact distal pulses.   Pulmonary/Chest: Effort normal and breath sounds normal.  Abdominal: Soft. Bowel sounds are normal.  Musculoskeletal: She exhibits no edema.  Neurological: She is alert and oriented to person, place, and time.  Skin: Skin is warm and dry.  Psychiatric: She has a normal mood and affect.       Assessment:     1. hypertension: Above goal today at 147/83 with pulse 97 2. diabetes mellitus type 2: Poorly controlled with hemoglobin A1c of 9.0 today 3. Back pain: Stable 4. health maintenance: 5. likely resolving bronchitis: Lung exam is clear today, O2 saturation 97%    Plan:     -We'll continue lisinopril 20 mg daily as patient is usually under good  control with systolic blood pressure in the 120s. If she continues to have elevated pressures we'll consider adding HCTZ to her lisinopril regimen.  -Patient's Lantus has been increased to 35 units prior visit. Patient reports that she was still taking 30 units thus reiterated to her to increase the Lantus to 35 and start checking her blood sugars with her meter. It is unclear if the patient actually taking prandial insulin of NovoLog. We'll recheck hemoglobin A1c in 3-4 months. Patient was advised to bring her glucometer at that time.  -Will refill tramadol 50 mg in have patient continue Lidoderm patch.  -Patient canceled colonoscopy appointment with Dr. Arlyce Dice. She reports that she will reschedule. -She has a appointment scheduled with her foot doctor Dr. Leticia Penna this Friday.

## 2011-10-19 NOTE — Patient Instructions (Signed)
It was nice to meet you today. You should be taking 35 units of Lantus daily. Check your blood sugars with your meter at at least 3 times a day and record the numbers. Prescriptions have been changed to the [facility] on [address]. Reschedule your colonoscopy with Dr. Arlyce Dice. Keep your appointment with Dr. Darrick Penna in sports medicine: 10/24/11 at 11:15 AM. Followup with me in 3-4 months and bring your blood sugar meter at bedtime.

## 2011-10-20 ENCOUNTER — Other Ambulatory Visit: Payer: Self-pay | Admitting: *Deleted

## 2011-10-20 DIAGNOSIS — K219 Gastro-esophageal reflux disease without esophagitis: Secondary | ICD-10-CM

## 2011-10-20 DIAGNOSIS — M545 Low back pain, unspecified: Secondary | ICD-10-CM

## 2011-10-20 DIAGNOSIS — M7500 Adhesive capsulitis of unspecified shoulder: Secondary | ICD-10-CM

## 2011-10-20 DIAGNOSIS — F32A Depression, unspecified: Secondary | ICD-10-CM

## 2011-10-20 DIAGNOSIS — E119 Type 2 diabetes mellitus without complications: Secondary | ICD-10-CM

## 2011-10-20 DIAGNOSIS — F329 Major depressive disorder, single episode, unspecified: Secondary | ICD-10-CM

## 2011-10-20 DIAGNOSIS — I10 Essential (primary) hypertension: Secondary | ICD-10-CM

## 2011-10-21 MED ORDER — OMEPRAZOLE 20 MG PO CPDR: 40.0000 mg | DELAYED_RELEASE_CAPSULE | Freq: Every day | ORAL | Status: DC

## 2011-10-21 MED ORDER — INSULIN ASPART 100 UNIT/ML ~~LOC~~ SOLN: SUBCUTANEOUS | Status: DC

## 2011-10-21 MED ORDER — METFORMIN HCL 1000 MG PO TABS: 1000.0000 mg | ORAL_TABLET | Freq: Two times a day (BID) | ORAL | Status: DC

## 2011-10-21 MED ORDER — GABAPENTIN 600 MG PO TABS: 600.0000 mg | ORAL_TABLET | Freq: Three times a day (TID) | ORAL | Status: DC

## 2011-10-21 MED ORDER — PRAVASTATIN SODIUM 40 MG PO TABS: 40.0000 mg | ORAL_TABLET | Freq: Every day | ORAL | Status: DC

## 2011-10-21 MED ORDER — PROMETHAZINE HCL 12.5 MG PO TABS: 12.5000 mg | ORAL_TABLET | Freq: Four times a day (QID) | ORAL | Status: DC | PRN

## 2011-10-24 ENCOUNTER — Ambulatory Visit: Payer: Medicare Other | Admitting: Sports Medicine

## 2011-10-28 ENCOUNTER — Emergency Department (HOSPITAL_COMMUNITY): Payer: Medicare Other

## 2011-10-28 ENCOUNTER — Observation Stay (HOSPITAL_COMMUNITY)
Admission: EM | Admit: 2011-10-28 | Discharge: 2011-10-29 | Disposition: A | Discharge status: Home / Self Care | Payer: Medicare Other | Attending: Internal Medicine | Admitting: Internal Medicine

## 2011-10-28 ENCOUNTER — Encounter (HOSPITAL_COMMUNITY): Payer: Self-pay | Admitting: Emergency Medicine

## 2011-10-28 DIAGNOSIS — F329 Major depressive disorder, single episode, unspecified: Secondary | ICD-10-CM

## 2011-10-28 DIAGNOSIS — E1139 Type 2 diabetes mellitus with other diabetic ophthalmic complication: Secondary | ICD-10-CM | POA: Insufficient documentation

## 2011-10-28 DIAGNOSIS — Z791 Long term (current) use of non-steroidal anti-inflammatories (NSAID): Secondary | ICD-10-CM | POA: Insufficient documentation

## 2011-10-28 DIAGNOSIS — M545 Low back pain, unspecified: Secondary | ICD-10-CM

## 2011-10-28 DIAGNOSIS — E785 Hyperlipidemia, unspecified: Secondary | ICD-10-CM | POA: Diagnosis present

## 2011-10-28 DIAGNOSIS — K219 Gastro-esophageal reflux disease without esophagitis: Secondary | ICD-10-CM

## 2011-10-28 DIAGNOSIS — E1140 Type 2 diabetes mellitus with diabetic neuropathy, unspecified: Secondary | ICD-10-CM | POA: Diagnosis present

## 2011-10-28 DIAGNOSIS — Z79899 Other long term (current) drug therapy: Secondary | ICD-10-CM | POA: Insufficient documentation

## 2011-10-28 DIAGNOSIS — R42 Dizziness and giddiness: Secondary | ICD-10-CM | POA: Insufficient documentation

## 2011-10-28 DIAGNOSIS — E1165 Type 2 diabetes mellitus with hyperglycemia: Secondary | ICD-10-CM | POA: Diagnosis present

## 2011-10-28 DIAGNOSIS — E119 Type 2 diabetes mellitus without complications: Secondary | ICD-10-CM

## 2011-10-28 DIAGNOSIS — M7500 Adhesive capsulitis of unspecified shoulder: Secondary | ICD-10-CM

## 2011-10-28 DIAGNOSIS — F32A Depression, unspecified: Secondary | ICD-10-CM

## 2011-10-28 DIAGNOSIS — R079 Chest pain, unspecified: Principal | ICD-10-CM | POA: Diagnosis present

## 2011-10-28 DIAGNOSIS — E1149 Type 2 diabetes mellitus with other diabetic neurological complication: Secondary | ICD-10-CM | POA: Insufficient documentation

## 2011-10-28 DIAGNOSIS — M7989 Other specified soft tissue disorders: Secondary | ICD-10-CM | POA: Diagnosis present

## 2011-10-28 DIAGNOSIS — E669 Obesity, unspecified: Secondary | ICD-10-CM | POA: Insufficient documentation

## 2011-10-28 DIAGNOSIS — E11319 Type 2 diabetes mellitus with unspecified diabetic retinopathy without macular edema: Secondary | ICD-10-CM | POA: Insufficient documentation

## 2011-10-28 DIAGNOSIS — I10 Essential (primary) hypertension: Secondary | ICD-10-CM | POA: Diagnosis present

## 2011-10-28 DIAGNOSIS — M549 Dorsalgia, unspecified: Secondary | ICD-10-CM

## 2011-10-28 DIAGNOSIS — R51 Headache: Secondary | ICD-10-CM | POA: Insufficient documentation

## 2011-10-28 DIAGNOSIS — Z794 Long term (current) use of insulin: Secondary | ICD-10-CM | POA: Insufficient documentation

## 2011-10-28 DIAGNOSIS — E1142 Type 2 diabetes mellitus with diabetic polyneuropathy: Secondary | ICD-10-CM | POA: Insufficient documentation

## 2011-10-28 DIAGNOSIS — R0602 Shortness of breath: Secondary | ICD-10-CM | POA: Insufficient documentation

## 2011-10-28 DIAGNOSIS — D649 Anemia, unspecified: Secondary | ICD-10-CM | POA: Diagnosis present

## 2011-10-28 HISTORY — DX: Atherosclerotic heart disease of native coronary artery without angina pectoris: I25.10

## 2011-10-28 LAB — CBC
HCT: 34 % — ABNORMAL LOW (ref 36.0–46.0)
Hemoglobin: 11 g/dL — ABNORMAL LOW (ref 12.0–15.0)
MCH: 27.9 pg (ref 26.0–34.0)
MCV: 86.3 fL (ref 78.0–100.0)
RBC: 3.94 MIL/uL (ref 3.87–5.11)
WBC: 10.3 10*3/uL (ref 4.0–10.5)

## 2011-10-28 LAB — BASIC METABOLIC PANEL
CO2: 24 mEq/L (ref 19–32)
Calcium: 9.6 mg/dL (ref 8.4–10.5)
Chloride: 102 mEq/L (ref 96–112)
Creatinine, Ser: 0.8 mg/dL (ref 0.50–1.10)
Glucose, Bld: 188 mg/dL — ABNORMAL HIGH (ref 70–99)

## 2011-10-28 MED ORDER — GABAPENTIN 600 MG PO TABS
600.0000 mg | ORAL_TABLET | Freq: Three times a day (TID) | ORAL | Status: DC
  Administered 2011-10-29 (×2): 600 mg via ORAL
  Filled 2011-10-28 (×4): qty 1

## 2011-10-28 MED ORDER — ENOXAPARIN SODIUM 40 MG/0.4ML ~~LOC~~ SOLN: 40.0000 mg | Freq: Every day | SUBCUTANEOUS | Status: DC

## 2011-10-28 MED ORDER — OXYCODONE-ACETAMINOPHEN 5-325 MG PO TABS: 1.0000 | ORAL_TABLET | Freq: Once | ORAL | Status: DC

## 2011-10-28 MED ORDER — PANTOPRAZOLE SODIUM 40 MG PO TBEC
40.0000 mg | DELAYED_RELEASE_TABLET | Freq: Every day | ORAL | Status: DC
  Administered 2011-10-29: 40 mg via ORAL
  Filled 2011-10-28: qty 1

## 2011-10-28 MED ORDER — MORPHINE SULFATE 4 MG/ML IJ SOLN
4.0000 mg | Freq: Once | INTRAMUSCULAR | Status: AC
  Administered 2011-10-28: 4 mg via INTRAVENOUS
  Filled 2011-10-28: qty 1

## 2011-10-28 MED ORDER — CYCLOBENZAPRINE HCL 10 MG PO TABS
10.0000 mg | ORAL_TABLET | Freq: Three times a day (TID) | ORAL | Status: DC | PRN
  Administered 2011-10-29: 10 mg via ORAL
  Filled 2011-10-28 (×2): qty 1

## 2011-10-28 MED ORDER — PROMETHAZINE HCL 12.5 MG PO TABS: 12.5000 mg | ORAL_TABLET | Freq: Four times a day (QID) | ORAL | Status: DC | PRN

## 2011-10-28 MED ORDER — ACETAMINOPHEN 325 MG PO TABS: 650.0000 mg | ORAL_TABLET | ORAL | Status: DC | PRN

## 2011-10-28 MED ORDER — SIMVASTATIN 20 MG PO TABS
20.0000 mg | ORAL_TABLET | Freq: Every day | ORAL | Status: DC
  Filled 2011-10-28: qty 1

## 2011-10-28 MED ORDER — NITROGLYCERIN 0.4 MG SL SUBL
0.4000 mg | SUBLINGUAL_TABLET | SUBLINGUAL | Status: DC | PRN
  Administered 2011-10-28: 0.4 mg via SUBLINGUAL
  Filled 2011-10-28: qty 25

## 2011-10-28 MED ORDER — ASPIRIN 81 MG PO CHEW: 324.0000 mg | CHEWABLE_TABLET | ORAL | Status: DC

## 2011-10-28 MED ORDER — ASPIRIN EC 81 MG PO TBEC
81.0000 mg | DELAYED_RELEASE_TABLET | Freq: Every day | ORAL | Status: DC
  Administered 2011-10-29: 81 mg via ORAL
  Filled 2011-10-28: qty 1

## 2011-10-28 MED ORDER — AMITRIPTYLINE HCL 25 MG PO TABS
25.0000 mg | ORAL_TABLET | Freq: Every day | ORAL | Status: DC
  Administered 2011-10-29: 25 mg via ORAL
  Filled 2011-10-28 (×2): qty 1

## 2011-10-28 MED ORDER — DIPHENHYDRAMINE HCL 25 MG PO CAPS: 25.0000 mg | ORAL_CAPSULE | Freq: Once | ORAL | Status: DC

## 2011-10-28 MED ORDER — ASPIRIN 300 MG RE SUPP: 300.0000 mg | RECTAL | Status: DC

## 2011-10-28 MED ORDER — DORZOLAMIDE HCL-TIMOLOL MAL 2-0.5 % OP SOLN
1.0000 [drp] | Freq: Two times a day (BID) | OPHTHALMIC | Status: DC
  Administered 2011-10-29 (×2): 1 [drp] via OPHTHALMIC
  Filled 2011-10-28: qty 10

## 2011-10-28 MED ORDER — ASPIRIN 81 MG PO CHEW
324.0000 mg | CHEWABLE_TABLET | Freq: Once | ORAL | Status: AC
  Administered 2011-10-28: 324 mg via ORAL
  Filled 2011-10-28: qty 4

## 2011-10-28 MED ORDER — METOPROLOL TARTRATE 12.5 MG HALF TABLET
12.5000 mg | ORAL_TABLET | Freq: Two times a day (BID) | ORAL | Status: DC
  Administered 2011-10-29 (×2): 12.5 mg via ORAL
  Filled 2011-10-28 (×4): qty 1

## 2011-10-28 MED ORDER — ONDANSETRON HCL 4 MG/2ML IJ SOLN: 4.0000 mg | Freq: Four times a day (QID) | INTRAMUSCULAR | Status: DC | PRN

## 2011-10-28 MED ORDER — INSULIN ASPART 100 UNIT/ML ~~LOC~~ SOLN
0.0000 [IU] | Freq: Three times a day (TID) | SUBCUTANEOUS | Status: DC
  Administered 2011-10-29 (×2): 3 [IU] via SUBCUTANEOUS

## 2011-10-28 MED ORDER — INSULIN GLARGINE 100 UNIT/ML ~~LOC~~ SOLN
35.0000 [IU] | Freq: Every day | SUBCUTANEOUS | Status: DC
  Administered 2011-10-28: 100 [IU] via SUBCUTANEOUS
  Filled 2011-10-28: qty 1

## 2011-10-28 MED ORDER — LISINOPRIL 20 MG PO TABS
20.0000 mg | ORAL_TABLET | Freq: Every day | ORAL | Status: DC
  Administered 2011-10-29: 20 mg via ORAL
  Filled 2011-10-28: qty 1

## 2011-10-28 NOTE — H&P (Signed)
Internal Medicine Attending Admission Note Date: 10/28/2011  Patient name: [name] Medical record number: [id number] Date of birth: [birth year] Age: 51 y.o. Gender: female  I saw and evaluated the patient. I reviewed the resident's note and I agree with the resident's findings and plan as documented in the resident's note.  Chief Complaint(s): Chest pain  History - key components related to admission:  [name] is a 51 year old woman with a history of diabetes, hypertension, hyperlipidemia, former tobacco abuse, and nonobstructive coronary artery disease with her last cath in 2008 showing a 50% stenosis of the LAD and 30-40% stenosis of the RCA who presents to [facility] with a history of intermittent chest pain x1 week. She states that while lying in bed one week ago she noted the onset of a sharp chest pain that was just left of the sternum near her breast. This pain did not radiate and was not associated with nausea, dizziness, diaphoresis, or shortness of breath. She rolled over in bed and the pain resolved after approximately 3-4 minutes. She thought nothing of this pain as it was not very severe. Today she had a recurrence of the pain while sitting in her daughter's car. The pain was of similar character being sharp and not radiating. What made it different today is that it was more severe than the other day. It too was not associated with shortness of breath, diaphoresis, dizziness, or nausea. It also spontaneously resolved after 3-4 minutes. This pain has been intermittently recurrent throughout the day each time lasting approximately 1-2 minutes and resolving spontaneously. Because she was concerned about the severity of the pain she presented to the emergency department for further evaluation. She is currently pain-free.  Physical Exam - key components related to admission:  Filed Vitals:   10/28/11 0944 10/28/11 1220 10/28/11 1612  BP: 152/81 143/66 152/72  Pulse: 106 91 96    Temp: 97.9 F (36.6 C) 97.6 F (36.4 C) 97.7 F (36.5 C)  TempSrc: Oral Oral Oral  Resp: 20 20 16   SpO2: 98% 98% 98%   General: Well-developed, well-nourished, woman lying comfortably on a hospital stretcher in no acute distress. Lungs: Clear to auscultation bilaterally without wheezes, rhonchi, or rales. Heart: Distant heart sounds, regular rate and rhythm without murmurs, rubs, or gallops appreciated. Abdomen: Soft, nontender, active bowel sounds without masses. Extremities 2+ bilateral lower extremity edema to the midshin. The left leg was larger than the right leg. There was no tenderness to palpation of the deep venous system.  Lab results:  Basic Metabolic Panel:  Basename 10/28/11 1045  NA 137  K 4.7  CL 102  CO2 24  GLUCOSE 188*  BUN 21  CREATININE 0.80  CALCIUM 9.6  MG --  PHOS --   CBC:  Basename 10/28/11 1045  WBC 10.3  NEUTROABS --  HGB 11.0*  HCT 34.0*  MCV 86.3  PLT 262   Cardiac Enzymes:  Basename 10/28/11 1405 10/28/11 1143  CKTOTAL -- --  CKMB -- --  CKMBINDEX -- --  TROPONINI <0.30 <0.30   Imaging results:  Dg Chest 2 View  10/28/2011  *RADIOLOGY REPORT*  Clinical Data: Chest pain.  CHEST - 2 VIEW  Comparison: 02/09/2011  Findings: Slight peribronchial thickening. Heart and mediastinal contours are within normal limits.  No focal opacities or effusions.  No acute bony abnormality.  IMPRESSION: Slight bronchitic changes.  Original Report Authenticated By: Cyndie Chime, M.D.   Other results:  EKG: Normal sinus rhythm at 97 beats  per minute, normal axis and intervals, no significant Q waves or left ventricular hypertrophy. Good R wave progression, no ST T changes.  Unchanged from the previous EKG on 02/09/2011.  Assessment & Plan by Problem:  [name] is a 51 year old woman with a history of diabetes, hypertension, hyperlipidemia, and coronary artery disease who presents with the onset of atypical chest pain one week ago. The pain is  intermittent, not associated with exertion, or other associated symptoms. Therefore, given the atypical nature of the pain, the chances this is coronary artery disease associated pain is low. What is concerning are the 4 risk factors that [name] has for progression of coronary artery disease. Specifically the diabetes, hypertension, hyperlipidemia, and prior tobacco abuse which raises the possibility for chest pain from epicardial disease. Therefore, she deserves an evaluation with serial EKGs and cardiac enzymes.  Confusing the picture is the fact that she has some lower extremity edema that is asymmetric. Although she has no risk factors other than obesity and age for a deep venous thrombosis the asymmetrical nature is somewhat concerning. Being a weekend it is unlikely we are going to be able to get lower extremity Dopplers to rule in or rule out a deep venous thrombosis. Using the Wells score for DVT she is at low risk.  1) Chest pain: We will admit [name] to observation status on telemetry. We will perform serial cardiac enzymes and EKGs. If she rules out for a myocardial infarction we will consult cardiology to set up a stress test which can be done as an outpatient if they feel it is appropriate. If she rules in we will consult cardiology asking for a cardiac cath to further characterize her disease and possibly intervene.  2) Lower extremity edema (asymmetric): We will obtain a d-dimer to assess for possible thromboembolism given the asymmetry of her edema. If the d-dimer is low no further evaluation will be necessary. If the d-dimer is not normal we will place her on low molecular weight heparin as a bridge and reassess for a deep venous thrombosis as an outpatient on Monday if she is discharged home.  3) Disposition: [name] will likely be discharged in the morning if she rules out for myocardial infarction and cardiology agrees that further evaluation can be completed as an  outpatient, if they feel it is necessary at all.

## 2011-10-28 NOTE — ED Provider Notes (Signed)
History     CSN: [id number]  Arrival date & time 10/28/11  4098   First MD Initiated Contact with Patient 10/28/11 [phone]      Chief Complaint  Patient presents with  . Chest Pain  . Shortness of Breath    (Consider location/radiation/quality/duration/timing/severity/associated sxs/prior treatment) HPI History from patient. 51 year old female with past medical history of hypertension, hyperlipidemia, diabetes presents with chest pain. She states this has been intermittent in nature for the past several days, but she had an especially severe episode while driving this morning. Episode lasted approximately 4 minutes. Her pain is described as sharp in nature, located substernally, and radiating through to the back. No radiation into the neck or arms. No associated abdominal pain. She endorses some shortness of breath while walking into the dept this morning, but none associated directly with the chest pain events. She had no associated diaphoresis, nausea, or vomiting with the episode. States the pain is currently "easing off" but is still present. She denies any cardiac history.  Has also had some associated headache today, described as located in frontal area bilaterally. It is dull and achy in nature. No associated photophobia, phonophobia, nausea, vomiting, neck pain. Has noted some dizziness on ambulation, which is described as a sensation of unsteadiness.  Past Medical History  Diagnosis Date  . Hypertension   . Hyperlipidemia   . GERD (gastroesophageal reflux disease)   . Adhesive capsulitis of shoulder     bilateral, Steroid injection Dr. Nedra Hai 1/12 bilaterally  . Peripheral neuropathy   . Adhesive capsulitis of right shoulder     Steroid injection Dr. Nedra Hai 1/12  . Adhesive capsulitis of left shoulder     Steroid injection Dr. Nedra Hai 1/12  . Obesity   . Diabetic retinopathy   . Glaucoma   . Diabetes mellitus     Type II    Past Surgical History  Procedure Date  . Cataract  extraction   . Glaucoma surgery     History reviewed. No pertinent family history.  History  Substance Use Topics  . Smoking status: Former Smoker    Types: Cigarettes  . Smokeless tobacco: Never Used  . Alcohol Use: No    OB History    Grav Para Term Preterm Abortions TAB SAB Ect Mult Living                  Review of Systems  Constitutional: Negative for fever, activity change and appetite change.  Respiratory: Positive for shortness of breath. Negative for cough.   Cardiovascular: Positive for chest pain. Negative for palpitations.  Gastrointestinal: Negative for nausea, vomiting and abdominal pain.  Musculoskeletal: Negative for myalgias.  Skin: Negative for color change and rash.  Neurological: Positive for dizziness and headaches. Negative for weakness.    Allergies  Review of patient's allergies indicates no known allergies.  Home Medications   Current Outpatient Rx  Name Route Sig Dispense Refill  . AMITRIPTYLINE HCL 25 MG PO TABS Oral Take 1 tablet (25 mg total) by mouth at bedtime. 30 tablet 1  . CYCLOBENZAPRINE HCL 10 MG PO TABS Oral Take 1 tablet (10 mg total) by mouth every 8 (eight) hours as needed for muscle spasms. 30 tablet 1  . DORZOLAMIDE HCL-TIMOLOL MAL 22.3-6.8 MG/ML OP SOLN Right Eye Place 1 drop into the right eye 2 (two) times daily.    Marland Kitchen FERROUS GLUCONATE 325 MG PO TABS Oral Take 325 mg by mouth 3 (three) times daily with meals.    Marland Kitchen  GABAPENTIN 600 MG PO TABS Oral Take 1 tablet (600 mg total) by mouth 3 (three) times daily. 90 tablet 3  . INSULIN ASPART 100 UNIT/ML [place] SOLN  Inject 3 units into the skin 10 mins before breakfast, 2 units 10 mins before lunch and 5 units 10 mins before dinner 15 mL 12  . INSULIN GLARGINE 100 UNIT/ML [place] SOLN Subcutaneous Inject 35 Units into the skin at bedtime. 10 mL 12  . LISINOPRIL 20 MG PO TABS Oral Take 2 tablets (40 mg total) by mouth daily. 30 tablet 11  . METFORMIN HCL 1000 MG PO TABS Oral Take 1 tablet (1,000  mg total) by mouth 2 (two) times daily with a meal. 60 tablet 5  . NAPROXEN 375 MG PO TABS Oral Take 1 tablet (375 mg total) by mouth 2 (two) times daily with a meal. 60 tablet 2  . OMEPRAZOLE 20 MG PO CPDR Oral Take 2 capsules (40 mg total) by mouth daily. 30 capsule 11  . PRAVASTATIN SODIUM 40 MG PO TABS Oral Take 1 tablet (40 mg total) by mouth daily. 30 tablet 6  . PROMETHAZINE HCL 12.5 MG PO TABS Oral Take 1 tablet (12.5 mg total) by mouth every 6 (six) hours as needed for nausea. 30 tablet 3  . TRAMADOL HCL 50 MG PO TABS Oral Take 1 tablet (50 mg total) by mouth every 6 (six) hours as needed for pain (for severe pain). 60 tablet 0  . PRODIGY MINI PEN NEEDLES 31G X 5 MM MISC  USE TO INJECT INSULIN THREE TIMES DAILY. 100 each 10    BP 152/81  Pulse 106  Temp(Src) 97.9 F (36.6 C) (Oral)  Resp 20  SpO2 98%  LMP 02/13/2011  Physical Exam  Nursing note and vitals reviewed. Constitutional: She is oriented to person, place, and time. She appears well-developed and well-nourished. No distress.  HENT:  Head: Normocephalic and atraumatic.  Eyes: EOM are normal. Pupils are equal, round, and reactive to light.  Neck: Normal range of motion. Neck supple.  Cardiovascular: Normal rate, regular rhythm and normal heart sounds.  Exam reveals no gallop and no friction rub.   No murmur heard. Pulmonary/Chest: Effort normal and breath sounds normal. She exhibits no tenderness.  Abdominal: Soft. Bowel sounds are normal. There is no tenderness. There is no rebound and no guarding.  Musculoskeletal: Normal range of motion.  Neurological: She is alert and oriented to person, place, and time. No cranial nerve deficit. She exhibits normal muscle tone. Coordination normal.  Skin: Skin is warm and dry. She is not diaphoretic.  Psychiatric: She has a normal mood and affect.    ED Course  Procedures (including critical care time)   Date: 10/28/2011  Rate: 97  Rhythm: normal sinus rhythm  QRS Axis:  normal  Intervals: normal  ST/T Wave abnormalities: normal  Conduction Disutrbances:none  Narrative Interpretation:   Old EKG Reviewed: as compared with February 09 2011 rate increased    Labs Reviewed  CBC - Abnormal; Notable for the following:    Hemoglobin 11.0 (*)    HCT 34.0 (*)    All other components within normal limits  BASIC METABOLIC PANEL - Abnormal; Notable for the following:    Glucose, Bld 188 (*)    GFR calc non Af Amer 83 (*)    All other components within normal limits  TROPONIN I  TROPONIN I   Dg Chest 2 View  10/28/2011  *RADIOLOGY REPORT*  Clinical Data: Chest pain.  CHEST -  2 VIEW  Comparison: 02/09/2011  Findings: Slight peribronchial thickening. Heart and mediastinal contours are within normal limits.  No focal opacities or effusions.  No acute bony abnormality.  IMPRESSION: Slight bronchitic changes.  Original Report Authenticated By: Cyndie Chime, M.D.     No diagnosis found. 1) chest pain    MDM  10:05 AM Pt assessed. Not diaphoretic, CP nonreproducible. HR ~100. ASA, NTG ordered.  11:40 AM I was still awaiting troponin which was ordered quite some time ago. Called minil[facility]; they do not have the sample. Lab trop ordered.  1:00 PM Called lab (as there was EPIC downtime) - troponin <0.30. Will plan to cycle 1 additional time.  3:32 PM Second troponin <0.30. Pt was having some persistent CP around 2 PM and was treated with NTG. She was pain free after this. Given this, will discuss with [name] and have them consult on pt. Plan d/w pt who was agreeable with plan.  4:08 PM D/W [facility] resident. They will be down to see patient shortly.  Grant Fontana, [place] 10/28/11 1610

## 2011-10-28 NOTE — ED Notes (Signed)
Pt c/o midsternal to left sided CP with radiation down left arm x 2 days with SOB

## 2011-10-28 NOTE — ED Notes (Signed)
Pt denies CP/SOB at this time.  

## 2011-10-28 NOTE — H&P (Signed)
Date: 10/28/2011               Patient Name:  [name] MRN: [id number]  DOB: 07-Apr-1961 Age / Sex: 51 y.o., female   PCP: Kristie Cowman, MD              Medical Service: Internal Medicine Teaching Service              Attending Physician: Dr. Ulyess Mort    First Contact: Dr. Clyde Lundborg Pager: [phone]  Second Contact: Dr. Saralyn Pilar Pager: [phone]            After Hours (After 5p/  First Contact Pager: [phone]  weekends / holidays): Second Contact Pager: [phone]     Chief Complaint: chest pain, dizziness  History of Present Illness: Patient is a 51 y.o. female with a PMHx of DMII (A1c 9), HTN, HLD, nonobstructive CAD (last cath 2008 with LAD showing 50% stenosis, and RCA with 30-40% stenosis) who presents to [facility] for evaluation of intermittent chest pain x 1 week. Indicates that has had an intermittent sharp left sided chest pain that self-resolves after 3-4 minutes at a time. Cannot qualify how frequently she is having episodes. This AM, while sitting in her daughter's car, she again experienced this sharp pain, which was much more severe than the prior episode. This chest pain was not specifically aggravated by specific precipitating factors (such as activity or deep breathing), there are also no specific alleviating factors. She is taking her tramadol for the pain, but feels like she is taking it too late. As well, she denies shortness of breath, difficulty breathing, fevers, chills, recent long distance travel or history of personal or family hx of blood clots. Secondary to the severity of the pain, she presented to the ED today.   During the ED course, she again experienced an episode of chest pain while at rest, lasting for approximately 1 minute. Was given NTG, with improvement of pain.   Current Outpatient Medications: Medication Sig  . amitriptyline (ELAVIL) 25 MG tablet Take 1 tablet (25 mg total) by mouth at bedtime.  . cyclobenzaprine (FLEXERIL) 10 MG tablet  Take 1 tablet (10 mg total) by mouth every 8 (eight) hours as needed for muscle spasms.  . dorzolamide-timolol (COSOPT) 22.3-6.8 MG/ML ophthalmic solution Place 1 drop into the right eye 2 (two) times daily.  Marland Kitchen gabapentin (NEURONTIN) 600 MG tablet Take 1 tablet (600 mg total) by mouth 3 (three) times daily.  . insulin aspart (NOVOLOG FLEXPEN) 100 UNIT/ML injection Inject 3 units into the skin 10 mins before breakfast, 2 units 10 mins before lunch and 5 units 10 mins before dinner  . insulin glargine (LANTUS) 100 UNIT/ML injection Inject 35 Units into the skin at bedtime.  Marland Kitchen lisinopril (PRINIVIL,ZESTRIL) 20 MG tablet Take 2 tablets (40 mg total) by mouth daily.  . metFORMIN (GLUCOPHAGE) 1000 MG tablet Take 1 tablet (1,000 mg total) by mouth 2 (two) times daily with a meal.  . omeprazole (PRILOSEC) 20 MG capsule Take 2 capsules (40 mg total) by mouth daily.  . pravastatin (PRAVACHOL) 40 MG tablet Take 1 tablet (40 mg total) by mouth daily.  . promethazine (PHENERGAN) 12.5 MG tablet Take 1 tablet (12.5 mg total) by mouth every 6 (six) hours as needed for nausea.  . traMADol (ULTRAM) 50 MG tablet Take 1 tablet (50 mg total) by mouth every 6 (six) hours as needed for pain (for severe pain).    Allergies: No Known Allergies  Past Medical History: Diagnosis Date  . Hypertension   . Hyperlipidemia   . GERD (gastroesophageal reflux disease)   . Adhesive capsulitis of shoulder     bilateral, Steroid injection Dr. Nedra Hai 1/12 bilaterally  . Peripheral neuropathy   . Adhesive capsulitis of right shoulder     Steroid injection Dr. Nedra Hai 1/12  . Adhesive capsulitis of left shoulder     Steroid injection Dr. Nedra Hai 1/12  . Obesity   . Diabetic retinopathy   . Glaucoma   . Diabetes mellitus     Type II, insulin dependent  . CAD (coronary artery disease)     nonobstructive. Last cardiac cath (2008) showing left circumflex with mid 50% stenosis and distal luminal irregularities. Also with RCA with mid to  distal 30-40% stenosis. // Previously evaluated by [facility], has not had regular follow-up    Past Surgical History: Procedure Date  . Cataract extraction   . Glaucoma surgery     Family History: History reviewed. No pertinent family history.  Social History: History   Social History  . Marital Status: Single    Spouse Name: N/A    Number of Children: N/A  . Years of Education: 12th grade   Occupational History  . 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    Social History Main Topics  . Smoking status: Former Smoker -- 0.3 packs/day for 7 years    Types: Cigarettes    Quit date: 06/27/2006  . Smokeless tobacco: Never Used  . Alcohol Use: No  . Drug Use: No  . Sexually Active: Not Currently   Other Topics Concern  . Not on file   Social History Narrative  . No narrative on file    Review of Systems: Constitutional:  denies fever, chills, diaphoresis, appetite change and fatigue.  HEENT: admits to eye pain, redness, hearing loss, ear pain, congestion, sore throat, rhinorrhea, sneezing, neck pain, neck stiffness and tinnitus.  Respiratory: admits to persistent cough, Denies SOB, cough, chest tightness, and wheezing.  Cardiovascular: admits to chest pain, leg swelling. Denies palpitations.  Gastrointestinal: admits to nausea, Denies vomiting, abdominal pain, diarrhea, constipation, blood in stool.  Genitourinary: denies dysuria, urgency, frequency, hematuria, flank pain and difficulty urinating.  Musculoskeletal: admits to  Chronic back pain. Denies joint swelling.   Skin: denies pallor, rash and wound.  Neurological: admits to positional lightheadedness, headache. Denies seizures, syncope, weakness, light-headedness, numbness.  Hematological: denies adenopathy, easy bruising, personal or family bleeding history.    Vital Signs: Blood pressure 152/72, pulse 96, temperature 97.7 F (36.5 C),  temperature source Oral, resp. rate 16, last menstrual period 02/13/2011, SpO2 98.00%.  Physical Exam: General: Vital signs reviewed and noted. Well-developed, well-nourished, in no acute distress; alert, appropriate and cooperative throughout examination.  Head: Normocephalic, atraumatic.  Eyes: PERRL, EOMI, No signs of anemia or jaundince.  Nose: Mucous membranes moist, not inflammed, nonerythematous.  Throat: Oropharynx nonerythematous, no exudate appreciated.   Neck: No deformities, masses, or tenderness noted. Supple, No carotid Bruits, no JVD.  Lungs:  Normal respiratory effort. Clear to auscultation BL without crackles or wheezes.  Heart: RRR. S1 and S2 normal without gallop, or rubs. (+) murmur.  Abdomen:  BS normoactive. Soft, Nondistended, epigastric tenderness to palpation.  No masses or organomegaly.  Extremities: 2+ left pretibial edema, 1+ right pretibial edema. Negative homan sign bilaterally.  Neurologic: A&O X3, CN II - XII are grossly intact. Motor strength is  5/5 in the all 4 extremities.    Lab results: Basic Metabolic Panel:  Basename 10/28/11 1045  NA 137  K 4.7  CL 102  CO2 24  GLUCOSE 188*  BUN 21  CREATININE 0.80  CALCIUM 9.6  MG --  PHOS --    CBC:    Component Value Date/Time   WBC 10.3 10/28/2011 1045   HGB 11.0* 10/28/2011 1045   HCT 34.0* 10/28/2011 1045   PLT 262 10/28/2011 1045   MCV 86.3 10/28/2011 1045   NEUTROABS 10.5* 02/09/2011 1033   LYMPHSABS 3.9 02/09/2011 1033   MONOABS 0.8 02/09/2011 1033   EOSABS 0.2 02/09/2011 1033   BASOSABS 0.0 02/09/2011 1033    Cardiac Enzymes:  Basename 10/28/11 1405 10/28/11 1143  CKTOTAL -- --  CKMB -- --  CKMBINDEX -- --  TROPONINI <0.30 <0.30    Historical labs: Lab Results  Component Value Date   HGBA1C 9.0 10/19/2011   Lab Results  Component Value Date   LDLCALC 83 04/15/2011    Imaging results:  Dg Chest 2 View  10/28/2011  *RADIOLOGY REPORT*  Clinical Data: Chest pain.  CHEST - 2 VIEW   Comparison: 02/09/2011  Findings: Slight peribronchial thickening. Heart and mediastinal contours are within normal limits.  No focal opacities or effusions.  No acute bony abnormality.  IMPRESSION: Slight bronchitic changes.  Original Report Authenticated By: Cyndie Chime, M.D.    Other results:  EKG (10/28/2011) - Normal Sinus Rhythm, regular rate of approximately 96 bpm, normal axis, ST segments: normal.    Assessment & Plan: Pt is a 51 y.o. yo female with a PMHx of DMII, HTN, HLD, nonobstructive CAD who was admitted on 10/28/2011 with symptoms of chest pain, which, at this point is of unknown etiology. Interventions at this time will be focused on better determining cause of her pain.   1) Chest pain - at this point, unclear etiology. Given her history of CAD (with last cath 2008 showing nonobstructive disease involving LAD and RCA), uncontrolled DMII, HTN, HLD, obesity, and former tobacco abuse, we certainly want to rule out ACS. Troponins have not been cycled for 12 hours at this point, and EKG is showing normal sinus rhythm. Also given asymmetric lower extremity edema (Lt > Rt), we are concerned about possible pulmonary embolism, although wells score is low probability. She does not have specific reason to have elevated d-dimer, therefore, this should be concerned. MSK pain is also concerning given hx of recent bronchitis and ongoing cough. - Admit to telemetry - Will cycle CE, check TSH, Check FLP in AM. - AM EKG. - Will consult cardiology in AM for consideration for stress test.  - Will check ddimer --> if elevated will start full dose anticoagulation. - Will given Aspirin, BB, statin, and PRN morphine and NTG.  2) Dizziness - at this point, the etiology of this problem is unclear. Symptoms do not appear consistent with BPPV as unaffected by head rotation. Thought possibly contributed by orthostasis versus possible contribution by her significant visual disturbances from her glaucoma.  -  Will check orthostatic vital signs - Continue to monitor - Will consider to recheck gait and Dix-Hallpike maneuver tomorrow to further assess for BPPV  3) DMII (HgA1c 9) - on home insulin therapy. - Will start home basal insulin, and SSI. - Will continue to encourage compliance. - Continue gabapentin for pain.  4) HTN - elevated at present, but has not taking medications today. On lisinopril at home, her PCP was consider  addition of HCTZ for better BP management and in setting of LE edema. However, has not yet been started. However, in setting of her known nonobstructive CAD and current CP component, will start on BB. - Start metoprolol 12.5 mg BID - Will decrease her lisinopril so as not to decrease her BP too aggressively.  5) HLD - well controlled on current medication although she confirms occasional noncompliance. - Will resume statin. - Recheck FLP in AM. - Educate about compliance.  6) Anemia - Has had chronic anemia with BL 11-11.7. At baseline currently. Has not been taking her iron supplementation. Check anemia panel. Of note, was supposed to have colonoscopy, multiple missed appts. - Check anemia panel. - Check FOBT.  7) GERD - stable, omeprazole  8) DVT PPX - Lovenox   Johnette Abraham, D.O.  PGY-II, Internal Medicine Resident 10/28/2011, 6:06 PM

## 2011-10-28 NOTE — ED Notes (Signed)
Pt transported to and from radiology on stretcher with tech and tolerated well. 

## 2011-10-29 ENCOUNTER — Encounter (HOSPITAL_COMMUNITY): Payer: Self-pay | Admitting: Family Medicine

## 2011-10-29 LAB — CARDIAC PANEL(CRET KIN+CKTOT+MB+TROPI)
CK, MB: 2.2 ng/mL (ref 0.3–4.0)
CK, MB: 2.3 ng/mL (ref 0.3–4.0)
CK, MB: 2.4 ng/mL (ref 0.3–4.0)
Total CK: 92 U/L (ref 7–177)
Total CK: 84 U/L (ref 7–177)

## 2011-10-29 LAB — IRON AND TIBC
TIBC: 339 ug/dL (ref 250–470)
UIBC: 290 ug/dL (ref 125–400)

## 2011-10-29 LAB — LIPID PANEL
LDL Cholesterol: 74 mg/dL (ref 0–99)
Triglycerides: 102 mg/dL (ref <150)
VLDL: 20 mg/dL (ref 0–40)

## 2011-10-29 LAB — GLUCOSE, CAPILLARY
Glucose-Capillary: 203 mg/dL — ABNORMAL HIGH (ref 70–99)
Glucose-Capillary: 238 mg/dL — ABNORMAL HIGH (ref 70–99)

## 2011-10-29 LAB — D-DIMER, QUANTITATIVE: D-Dimer, Quant: 2.83 ug/mL-FEU — ABNORMAL HIGH (ref 0.00–0.48)

## 2011-10-29 LAB — RETICULOCYTES: Retic Ct Pct: 1.5 % (ref 0.4–3.1)

## 2011-10-29 LAB — FERRITIN: Ferritin: 111 ng/mL (ref 10–291)

## 2011-10-29 MED ORDER — ENOXAPARIN SODIUM 150 MG/ML ~~LOC~~ SOLN: 1.0000 mg/kg | Freq: Two times a day (BID) | SUBCUTANEOUS | Status: DC

## 2011-10-29 MED ORDER — NAPROXEN 375 MG PO TABS: 375.0000 mg | ORAL_TABLET | Freq: Two times a day (BID) | ORAL | Status: DC | PRN

## 2011-10-29 MED ORDER — HEPARIN (PORCINE) IN NACL 100-0.45 UNIT/ML-% IJ SOLN
1550.0000 [IU]/h | INTRAMUSCULAR | Status: DC
  Administered 2011-10-29: 1550 [IU]/h via INTRAVENOUS
  Filled 2011-10-29 (×2): qty 250

## 2011-10-29 MED ORDER — LISINOPRIL 20 MG PO TABS: 20.0000 mg | ORAL_TABLET | Freq: Every day | ORAL | Status: DC

## 2011-10-29 MED ORDER — METOPROLOL TARTRATE 25 MG PO TABS: 12.5000 mg | ORAL_TABLET | Freq: Two times a day (BID) | ORAL | Status: DC

## 2011-10-29 MED ORDER — ENOXAPARIN SODIUM 120 MG/0.8ML ~~LOC~~ SOLN
1.0000 mg/kg | Freq: Two times a day (BID) | SUBCUTANEOUS | Status: DC
  Administered 2011-10-29: 115 mg via SUBCUTANEOUS
  Filled 2011-10-29 (×2): qty 0.8

## 2011-10-29 MED ORDER — ENOXAPARIN (LOVENOX) PATIENT EDUCATION KIT
PACK | Freq: Once | Status: AC
  Administered 2011-10-29: 12:00:00
  Filled 2011-10-29: qty 1

## 2011-10-29 MED ORDER — HEPARIN BOLUS VIA INFUSION
5000.0000 [IU] | Freq: Once | INTRAVENOUS | Status: AC
  Administered 2011-10-29: 5000 [IU] via INTRAVENOUS
  Filled 2011-10-29: qty 5000

## 2011-10-29 NOTE — Progress Notes (Signed)
Resident Addendum: I reviewed the note with the medical student. Please see my note for additional information.  Johnette Abraham, D.O.  10/29/2011, 2:03 PM

## 2011-10-29 NOTE — Progress Notes (Signed)
ANTICOAGULATION CONSULT NOTE - Follow Up Consult  Pharmacy Consult for Heparin/Coumadin Indication: DVT / r/o PE  No Known Allergies  Patient Measurements: Height: 5\' 6"  (167.6 cm) Weight: 252 lb (114.306 kg) IBW/kg (Calculated) : 59.3    Vital Signs: Temp: 98 F (36.7 C) (05/04 0500) Temp src: Oral (05/04 0500) BP: 104/61 mmHg (05/04 0500) Pulse Rate: 79  (05/04 0500)  Labs:  Alvira Philips 10/29/11 0835 10/29/11 0610 10/29/11 0249 10/28/11 2330 10/28/11 1405 10/28/11 1045  HGB -- -- -- -- -- 11.0*  HCT -- -- -- -- -- 34.0*  PLT -- -- -- -- -- 262  APTT -- -- -- -- -- --  LABPROT -- -- 12.5 -- -- --  INR -- -- 0.91 -- -- --  HEPARINUNFRC 1.48* -- -- -- -- --  CREATININE -- -- -- -- -- 0.80  CKTOTAL -- 78 -- 92 -- --  CKMB -- 2.3 -- 2.2 -- --  TROPONINI -- <0.30 -- <0.30 <0.30 --   Estimated Creatinine Clearance: 105.6 ml/min (by C-G formula based on Cr of 0.8).  Assessment: 50yof with CAD hx admitted with intermittent CP x1 week.  Elevated D-Dime and L>R LE edema.  Heparin started but now plan to d/c pt home on Lovenox 1mg /kg q12 and coumadin.  No interacting medications and young, obese female will initiate Coumadin at 10mg  daily over the weekend.  Pt to f/u with [facility] clinic on Monday.  Further dosing per them.    Goal of Therapy:  INR 2-3   Plan:  Coumadin 10mg  daily  Lovenox 115mg  q12  Marcelino Scot 10/29/2011,10:09 AM

## 2011-10-29 NOTE — ED Provider Notes (Signed)
Medical screening examination/treatment/procedure(s) were performed by non-physician practitioner and as supervising physician I was immediately available for consultation/collaboration.   Riya Huxford A Akansha Wyche, MD 10/29/11 1242 

## 2011-10-29 NOTE — Progress Notes (Signed)
Medical Student Daily Progress Note  Subjective: Patient reports feeling much better than yesterday. Her chest pain is reported to be very minimal today and she does not feel the sharp pain sensations that she was having upon arrival. She says she was able to sleep a little better after her heparin drip was started last night which attributed to relief of her pain. Her cough has improved as well. She has been eating this morning and does not report any n&v. She does still report a little lightheadedness when sitting up on the bed before she stands to go to the restroom. She is resting in bed comfortably upon exam. Objective: Vital signs in last 24 hours: Filed Vitals:   10/28/11 2347 10/28/11 2349 10/29/11 0500 10/29/11 1300  BP: 149/84 161/92 104/61 132/77  Pulse: 100 100 79 78  Temp:   98 F (36.7 C) 97.3 F (36.3 C)  TempSrc:   Oral Oral  Resp:   18 22  Height:      Weight:   114.306 kg (252 lb)   SpO2:   98%    Weight change:   Intake/Output Summary (Last 24 hours) at 10/29/11 1327 Last data filed at 10/29/11 0800  Gross per 24 hour  Intake    240 ml  Output      0 ml  Net    240 ml   Physical Exam: BP 132/77  Pulse 78  Temp(Src) 97.3 F (36.3 C) (Oral)  Resp 22  Ht 5\' 6"  (1.676 m)  Wt 114.306 kg (252 lb)  BMI 40.67 kg/m2  SpO2 98%  LMP 02/13/2011 General appearance: alert, cooperative and no distress Head: Normocephalic, without obvious abnormality, atraumatic Eyes: conjunctivae/corneas clear. PERRL, EOM's intact. Fundi benign. Back: symmetric, no curvature. ROM normal. No CVA tenderness. Lungs: clear to auscultation bilaterally Heart: regular rate and rhythm, S1, S2 normal, no murmur, click, rub or gallop Abdomen: soft, non-tender; bowel sounds normal; no masses,  no organomegaly Extremities: edema 2+ pitting, Left LE greater than Right Skin: Skin color, texture, turgor normal. No rashes or lesions Lab Results: Basic Metabolic Panel:  Lab 10/28/11 0102  NA  137  K 4.7  CL 102  CO2 24  GLUCOSE 188*  BUN 21  CREATININE 0.80  CALCIUM 9.6  MG --  PHOS --   Liver Function Tests: No results found for this basename: AST:2,ALT:2,ALKPHOS:2,BILITOT:2,PROT:2,ALBUMIN:2 in the last 168 hours No results found for this basename: LIPASE:2,AMYLASE:2 in the last 168 hours No results found for this basename: AMMONIA:2 in the last 168 hours CBC:  Lab 10/28/11 1045  WBC 10.3  NEUTROABS --  HGB 11.0*  HCT 34.0*  MCV 86.3  PLT 262   Cardiac Enzymes:  Lab 10/29/11 1139 10/29/11 0610 10/28/11 2330  CKTOTAL 84 78 92  CKMB 2.4 2.3 2.2  CKMBINDEX -- -- --  TROPONINI <0.30 <0.30 <0.30   BNP: No results found for this basename: PROBNP:3 in the last 168 hours D-Dimer:  Lab 10/28/11 2330  DDIMER 2.83*   CBG:  Lab 10/29/11 1125 10/29/11 0734 10/28/11 2246  GLUCAP 238* 203* 271*   Hemoglobin A1C: No results found for this basename: HGBA1C in the last 168 hours Fasting Lipid Panel:  Lab 10/29/11 0610  CHOL 148  HDL 54  LDLCALC 74  TRIG 102  CHOLHDL 2.7  LDLDIRECT --   Thyroid Function Tests:  Lab 10/28/11 2330  TSH 2.357  T4TOTAL --  FREET4 --  T3FREE --  THYROIDAB --   Coagulation:  Lab  10/29/11 0249  LABPROT 12.5  INR 0.91   Anemia Panel:  Lab 10/28/11 2330  VITAMINB12 682  FOLATE 10.1  FERRITIN 111  TIBC 339  IRON 49  RETICCTPCT 1.5   Urine Drug Screen: Drugs of Abuse     Component Value Date/Time   LABOPIA POSITIVE* 08/18/2008 1500   COCAINSCRNUR NONE DETECTED 08/18/2008 1500   LABBENZ NONE DETECTED 08/18/2008 1500   AMPHETMU NONE DETECTED 08/18/2008 1500   THCU NONE DETECTED 08/18/2008 1500   LABBARB  Value: NONE DETECTED        DRUG SCREEN FOR MEDICAL PURPOSES ONLY.  IF CONFIRMATION IS NEEDED FOR ANY PURPOSE, NOTIFY LAB WITHIN 5 DAYS.        LOWEST DETECTABLE LIMITS FOR URINE DRUG SCREEN Drug Class       Cutoff (ng/mL) Amphetamine      1000 Barbiturate      200 Benzodiazepine   200 Tricyclics       300 Opiates           300 Cocaine          300 THC              50 08/18/2008 1500    Alcohol Level: No results found for this basename: ETH:2 in the last 168 hours Urinalysis: No results found for this basename: COLORURINE:2,APPERANCEUR:2,LABSPEC:2,PHURINE:2,GLUCOSEU:2,HGBUR:2,BILIRUBINUR:2,KETONESUR:2,PROTEINUR:2,UROBILINOGEN:2,NITRITE:2,LEUKOCYTESUR:2 in the last 168 hours  Micro Results: No results found for this or any previous visit (from the past 240 hour(s)). Studies/Results: Dg Chest 2 View  10/28/2011  *RADIOLOGY REPORT*  Clinical Data: Chest pain.  CHEST - 2 VIEW  Comparison: 02/09/2011  Findings: Slight peribronchial thickening. Heart and mediastinal contours are within normal limits.  No focal opacities or effusions.  No acute bony abnormality.  IMPRESSION: Slight bronchitic changes.  Original Report Authenticated By: Cyndie Chime, M.D.   Medications:  I have reviewed the patient's current medications. Prior to Admission:  Prescriptions prior to admission  Medication Sig Dispense Refill  . amitriptyline (ELAVIL) 25 MG tablet Take 1 tablet (25 mg total) by mouth at bedtime.  30 tablet  1  . cyclobenzaprine (FLEXERIL) 10 MG tablet Take 1 tablet (10 mg total) by mouth every 8 (eight) hours as needed for muscle spasms.  30 tablet  1  . dorzolamide-timolol (COSOPT) 22.3-6.8 MG/ML ophthalmic solution Place 1 drop into the right eye 2 (two) times daily.      Marland Kitchen gabapentin (NEURONTIN) 600 MG tablet Take 1 tablet (600 mg total) by mouth 3 (three) times daily.  90 tablet  3  . insulin aspart (NOVOLOG FLEXPEN) 100 UNIT/ML injection Inject 3 units into the skin 10 mins before breakfast, 2 units 10 mins before lunch and 5 units 10 mins before dinner  15 mL  12  . insulin glargine (LANTUS) 100 UNIT/ML injection Inject 35 Units into the skin at bedtime.  10 mL  12  . lisinopril (PRINIVIL,ZESTRIL) 20 MG tablet Take 2 tablets (40 mg total) by mouth daily.  30 tablet  11  . metFORMIN (GLUCOPHAGE) 1000 MG tablet  Take 1 tablet (1,000 mg total) by mouth 2 (two) times daily with a meal.  60 tablet  5  . omeprazole (PRILOSEC) 20 MG capsule Take 2 capsules (40 mg total) by mouth daily.  30 capsule  11  . pravastatin (PRAVACHOL) 40 MG tablet Take 1 tablet (40 mg total) by mouth daily.  30 tablet  6  . promethazine (PHENERGAN) 12.5 MG tablet Take 1 tablet (12.5 mg total) by mouth  every 6 (six) hours as needed for nausea.  30 tablet  3  . traMADol (ULTRAM) 50 MG tablet Take 1 tablet (50 mg total) by mouth every 6 (six) hours as needed for pain (for severe pain).  60 tablet  0  . PRODIGY MINI PEN NEEDLES 31G X 5 MM MISC USE TO INJECT INSULIN THREE TIMES DAILY.  100 each  10   Scheduled Meds:    . amitriptyline  25 mg Oral QHS  . aspirin EC  81 mg Oral Daily  . dorzolamide-timolol  1 drop Right Eye BID  . enoxaparin (LOVENOX) injection  1 mg/kg Subcutaneous Q12H  . enoxaparin   Does not apply Once  . gabapentin  600 mg Oral TID  . heparin  5,000 Units Intravenous Once  . insulin aspart  0-9 Units Subcutaneous TID WC  . insulin glargine  35 Units Subcutaneous QHS  . lisinopril  20 mg Oral Daily  . metoprolol tartrate  12.5 mg Oral BID  . pantoprazole  40 mg Oral Q1200  . simvastatin  20 mg Oral q1800  . DISCONTD: aspirin  324 mg Oral NOW  . DISCONTD: aspirin  300 mg Rectal NOW  . DISCONTD: diphenhydrAMINE  25 mg Oral Once  . DISCONTD: enoxaparin  40 mg Subcutaneous Daily  . DISCONTD: oxyCODONE-acetaminophen  1 tablet Oral Once   Continuous Infusions:    . DISCONTD: heparin 1,550 Units/hr (10/29/11 0251)   PRN Meds:.acetaminophen, cyclobenzaprine, nitroGLYCERIN, ondansetron (ZOFRAN) IV, promethazine  Assessment/Plan:  Chest pain - Her chest pain has mostly resolved today 2/2 initiation of heparin overnight d/t positive D-dimer, 2.83. CXR, EKG and CE x 3 were negative. She continues to have LE edema (L>R), however she does report that this has been present for about 3 weeks PTA. She will go home on  Lovenox and return to clinic for dopplers of her legs. If clots are revealed she will be bridged to Coumadin at that time. -d/c heparin and give lovenox this morning -patient education for home Lovenox injections until f/u appt in clinic  Dizziness - Still present today but has improved since yesterday. Component of orthostasis vs. BPPV still a possibility. -will check orthostatics today, if negative, will perform dix-halpike  DMII (HgA1c =9) - Takes Lantus and Novolog flexpen at home -continue home Lantus 35 units qhs and SSI -continue gabapentin for peripheral neuropathy  HTN - moderately well controlled on admission, given her h/o nonobstructive CAD and her CP on admission, BB therapy was initiated with appropriate adjustment of her ACEi. -continue on metoprolol 12.5mg  BID -continue new dose of lisinopril 20 mg daily (old dose = 40 mg daily)  Hyperlipidemia - well controlled on her home regimen -continue pravastatin  Anemia - h/o chronic anemia, baseline = 11-11.7. She has not been taking her ferrous gluconate for over 3 months at home. Iron panel all within normal limits (B12, Folate, Iron, TIBC, Ferritin, Retic). She is at baseline on admission (Hgb=11). -will continue to monitor her in outpatient setting  GERD - stable on her home regimen -continue omeprazole  DVT ppx - Lovenox   LOS: 1 day   This is a Psychologist, occupational Note.  The care of the patient was discussed with Dr. Priscella Mann and the assessment and plan formulated with their assistance.  Please see their attached note for official documentation of the daily encounter.  Lewie Chamber 10/29/2011, 1:27 PM

## 2011-10-29 NOTE — Progress Notes (Signed)
Resident Addendum to Medical Student Note   I have seen and examined the patient, and reviewed the daily progress note by Lovey Newcomer III and discussed the care of the patient with them.  See below for documentation of my findings, assessment, and plans.  HPI: Patient feeling well, no acute complaints. Chest pain is resolved at this time.  Interval Events: D-dimer came back elevated --> therefore, in setting of her asymmetrical LE edema, was started on anticoagulation.   OBJECTIVE: VS: Reviewed as documented in electronic medical record  Meds: Reviewed as documented in electronic medical record  Labs: Reviewed as documented in electronic medical record  Imaging: Reviewed as documented in electronic medical record   Physical Exam: General: Vital signs reviewed and noted. Well-developed, well-nourished, in no acute distress; alert, appropriate and cooperative throughout examination.  Lungs:  Normal respiratory effort. Clear to auscultation BL without crackles or wheezes.  Heart: RRR. S1 and S2 normal without gallop, rubs, (+) murmur.  Abdomen:  BS normoactive. Soft, Nondistended, non-tender.  No masses or organomegaly.  Extremities: 2+ left pretibial edema, 1+ right pretibial edema. Negative homan sign bilaterally.      ASSESSMENT/ PLAN: Pt is a 51 y.o. yo female with a PMHx of DMII, HTN, HLD, nonobstructive CAD who was admitted on 10/28/2011 with symptoms of chest pain, which, at this point is of unknown etiology. Interventions at this time will be focused on better determining cause of her pain.   1) Chest pain - at this point, presumed to be a MSK pain in the setting of her ongoing cough with recent bronchitis. On admission, concerned for possible cardiac etiology given history of CAD (with last cath 2008 showing nonobstructive disease involving LAD and RCA), uncontrolled DMII, HTN, HLD, obesity, and former tobacco abuse. However, ACS has been ruled out with negative CE x 3, and  normal EKG. TSH pending.  - Will try NSAIDs for pain. - Although she has ruled for ACS, she still has known disease, and multiple risk factors, so will likely need outpatient cardiology followup for consideration for stress test.   2) LE asymmetric edema - Lt > Rt. Given presentation and ddimer of 2.8, I am concerned about possible DVT. Therefore, she was started on heparin overnight. - Will change to lovenox so as it can administered at home. - Will provide Lovenox teaching. - Will plan clinic follow-up on Monday or Tuesday, to arrange lower extremity doppler (since we could not get it over the weekend)   If Doppler negative --> would plan to dc Lovenox  If Doppler positive --> would need to get BL PT/INR --> start Coumadin, and bridge for at least 5 days (ensuring coumadin is therapeutic at that time) --> bring back to clinic for PT/INR recheck with Dr. Alexandria Lodge.  3) DMII (HgA1c 9) - on home insulin therapy.  - Continue home basal insulin. - Continue to encourage compliance.  - Continue gabapentin for pain.   4) HTN - well controlled. Was started on BB yesterday given her known nonobstructive CAD and RF. Was reduced of home lisinopril so as not to produce hypotension.  - Continue current.   5) HLD - well controlled on current medication  With LDL at goal at 74 mg/dL. - Continue current statin.  - Educate about compliance.   6) Anemia - Has had chronic anemia with BL 11-11.7. At baseline currently. Has not been taking her iron supplementation. Check anemia panel. Of note, was supposed to have colonoscopy, multiple missed appts.  -  Anemia panel in process --> will follow up as outpatient and consider restarting her iron if indicated.  7) GERD - stable, omeprazole   8) Dizziness - stable. At this point, the etiology of this problem is unclear. Symptoms do not appear consistent with BPPV as unaffected by head rotation. Thought possibly contributed by orthostasis versus possible contribution  by her significant visual disturbances from her glaucoma.   9) DVT PPX - Lovenox  10) Dispo - Home today, follow-up in clinic on Monday or Tuesday for check of doppler ultrasound, check INR --> to be established with coumadin clinic and start coumadin at that time if the LE doppler is consistent with DVT. Otherwise, will be discontinued of her Lovenox.   Length of Stay: 1   Johnette Abraham, D.OGlenna Durand, Internal Medicine Resident 10/29/2011, 9:15 AM

## 2011-10-29 NOTE — Discharge Summary (Signed)
Patient Name:  [name]  MRN: [id number]  PCP: Kristie Cowman, MD  DOB:  1961-05-27      Date of Admission:  10/28/2011  Date of Discharge:  10/29/2011      Attending Physician: Dr. Rocco Serene, MD         DISCHARGE DIAGNOSES: 1. Chest pain - likely musculoskeletal pain due to costochondritis in setting of coughing. ACS ruled out with CE neg x 3, neg EKG. TSH wnl. 2. Swelling of left lower extremity r/o LLE DVT - were unable to perform doppler US over the weekend. Ddimer was elevated at 2.8 Started on Lovenox, needs to get outpatient doppler and adjustment of anticoagulation as indicated. 3. Diabetes Mellitus, type 2 - uncontrolled, A1c 9. No adjustments made to insulin regimen. 4. Hyperlipidemia - well controlled on current regimen, LDL 74 mg/dL 5. Anemia - unclear cause, anemia panel ordered and pending at time of discharge, will need to follow up as outpatient. 6. Hypertension - well controlled. Newly started on a beta blocker given her hx of nonobstructive CAD. Home Lisinopril decreased to accommodate new BP med. 7. GERD 8. Dizziness - unclear cause, possibly secondary to visual disturbances from her glaucoma versus autonomic dysfunction from uncontrolled DMII. Negative orthostatics.   DISPOSITION AND FOLLOW-UP: [name] is to follow-up with the listed providers as detailed below, at which time, the following should be addressed:  1) Labs - please check PT/INR to establish a baseline, as well please check a CBC since she is on Lovenox therapy. 2) Pending labs - please follow up on 1) anemia panel, with consideration to restart iron therapy and check FOBT as indicated.  3) Referrals - please set up for outpatient cardiology evaluation, for consideration for stress test. 4) Medications - ensure tolerating her metoprolol. 5) Imaging - please get a STAT LE Doppler ultrasound of LLE to assess for DVT and have pt wait for results. Patient is already on Lovenox therapy,  but would need an additional prescription if to be continued. If Doppler negative --> would plan to dc Lovenox  If Doppler positive --> would need to get BL PT/INR --> start Coumadin, and bridge for at least 5 days (ensuring coumadin is therapeutic at that time) --> bring back to clinic for PT/INR recheck with Dr. Alexandria Lodge.   Follow-up Appointments: Follow-up Information    Follow up with Internal medicine outpatient clinic. (Follow up on Monday 4/6 or Tuesday 4/7. You will be called with an appointment. Call the clinic if you haven't been called by Tuesday morning. )    Contact information:   [phone]         DISCHARGE MEDICATIONS: Medication List  As of 10/29/2011  2:43 PM   TAKE these medications         amitriptyline 25 MG tablet   Commonly known as: ELAVIL   Take 1 tablet (25 mg total) by mouth at bedtime.      aspirin 81 MG tablet - Patient was called with addition of this medication (as inadvertently left off of dc med list)   Commonly known as: Aspirin   Take 1 tablet (81 mg total) by mouth daily   cyclobenzaprine 10 MG tablet   Commonly known as: FLEXERIL   Take 1 tablet (10 mg total) by mouth every 8 (eight) hours as needed for muscle spasms.      dorzolamide-timolol 22.3-6.8 MG/ML ophthalmic solution   Commonly known as: COSOPT   Place 1 drop into the  right eye 2 (two) times daily.      enoxaparin 150 MG/ML injection   Commonly known as: LOVENOX   Inject 0.76 mLs (115 mg total) into the skin every 12 (twelve) hours.      gabapentin 600 MG tablet   Commonly known as: NEURONTIN   Take 1 tablet (600 mg total) by mouth 3 (three) times daily.      insulin aspart 100 UNIT/ML injection   Commonly known as: novoLOG   Inject 3 units into the skin 10 mins before breakfast, 2 units 10 mins before lunch and 5 units 10 mins before dinner      insulin glargine 100 UNIT/ML injection   Commonly known as: LANTUS   Inject 35 Units into the skin at bedtime.      lisinopril 20 MG  tablet   Commonly known as: PRINIVIL,ZESTRIL   Take 1 tablet (20 mg total) by mouth daily.      metFORMIN 1000 MG tablet   Commonly known as: GLUCOPHAGE   Take 1 tablet (1,000 mg total) by mouth 2 (two) times daily with a meal.      metoprolol tartrate 25 MG tablet   Commonly known as: LOPRESSOR   Take 0.5 tablets (12.5 mg total) by mouth 2 (two) times daily.      naproxen 375 MG tablet   Commonly known as: NAPROSYN   Take 1 tablet (375 mg total) by mouth 2 (two) times daily as needed (pain). With meals      omeprazole 20 MG capsule   Commonly known as: PRILOSEC   Take 2 capsules (40 mg total) by mouth daily.      pravastatin 40 MG tablet   Commonly known as: PRAVACHOL   Take 1 tablet (40 mg total) by mouth daily.      PRODIGY MINI PEN NEEDLES 31G X 5 MM Misc   Generic drug: Insulin Pen Needle   USE TO INJECT INSULIN THREE TIMES DAILY.      promethazine 12.5 MG tablet   Commonly known as: PHENERGAN   Take 1 tablet (12.5 mg total) by mouth every 6 (six) hours as needed for nausea.      traMADol 50 MG tablet   Commonly known as: ULTRAM   Take 1 tablet (50 mg total) by mouth every 6 (six) hours as needed for pain (for severe pain).            CONSULTS:    None.   PROCEDURES PERFORMED:   Dg Chest 2 View (10/28/2011) - Slight bronchitic changes.  Original Report Authenticated By: Cyndie Chime, M.D.      ADMISSION DATA: H&P: Patient is a 51 y.o. female with a PMHx of DMII (A1c 9), HTN, HLD, nonobstructive CAD (last cath 2008 with LAD showing 50% stenosis, and RCA with 30-40% stenosis) who presents to [facility] for evaluation of intermittent chest pain x 1 week. Indicates that has had an intermittent sharp left sided chest pain that self-resolves after 3-4 minutes at a time. Cannot qualify how frequently she is having episodes. This AM, while sitting in her daughter's car, she again experienced this sharp pain, which was much more severe than the prior episode. This chest pain  was not specifically aggravated by specific precipitating factors (such as activity or deep breathing), there are also no specific alleviating factors. She is taking her tramadol for the pain, but feels like she is taking it too late. As well, she denies shortness of breath, difficulty breathing, fevers, chills,  recent long distance travel or history of personal or family hx of blood clots. Secondary to the severity of the pain, she presented to the ED today.  During the ED course, she again experienced an episode of chest pain while at rest, lasting for approximately 1 minute. Was given NTG, with improvement of pain.  Physical Exam: Vital Signs: Blood pressure 152/72, pulse 96, temperature 97.7 F (36.5 C), temperature source Oral, resp. rate 16, last menstrual period 02/13/2011, SpO2 98.00%.   Physical Exam:  General:  Vital signs reviewed and noted. Well-developed, well-nourished, in no acute distress; alert, appropriate and cooperative throughout examination.   Head:  Normocephalic, atraumatic.   Eyes:  PERRL, EOMI, No signs of anemia or jaundince.   Nose:  Mucous membranes moist, not inflammed, nonerythematous.   Throat:  Oropharynx nonerythematous, no exudate appreciated.   Neck:  No deformities, masses, or tenderness noted. Supple, No carotid Bruits, no JVD.   Lungs:  Normal respiratory effort. Clear to auscultation BL without crackles or wheezes.   Heart:  RRR. S1 and S2 normal without gallop, or rubs. (+) murmur.   Abdomen:  BS normoactive. Soft, Nondistended, epigastric tenderness to palpation. No masses or organomegaly.   Extremities:  2+ left pretibial edema, 1+ right pretibial edema. Negative homan sign bilaterally.   Neurologic:  A&O X3, CN II - XII are grossly intact. Motor strength is 5/5 in the all 4 extremities.     Labs: Basic Metabolic Panel:   Basename  10/28/11 1045   NA  137   K  4.7   CL  102   CO2  24   GLUCOSE  188*   BUN  21   CREATININE  0.80   CALCIUM  9.6    MG  --   PHOS  --    CBC:    Component  Value  Date/Time    WBC  10.3  10/28/2011 1045    HGB  11.0*  10/28/2011 1045    HCT  34.0*  10/28/2011 1045    PLT  262  10/28/2011 1045    MCV  86.3  10/28/2011 1045    NEUTROABS  10.5*  02/09/2011 1033    LYMPHSABS  3.9  02/09/2011 1033    MONOABS  0.8  02/09/2011 1033    EOSABS  0.2  02/09/2011 1033    BASOSABS  0.0  02/09/2011 1033    Cardiac Enzymes:  Basename  10/28/11 1405  10/28/11 1143   CKTOTAL  --  --   CKMB  --  --   CKMBINDEX  --  --   TROPONINI  <0.30  <0.30     Lab Results  Component Value Date   DDIMER 2.83* 10/28/2011    Historical labs:  Lab Results   Component  Value  Date    HGBA1C  9.0  10/19/2011    Lab Results   Component  Value  Date    LDLCALC  83  04/15/2011     HOSPITAL COURSE: 1. Atypical chest pain - likely musculoskeletal pain due to costochondritis in setting of ongoing cough with bronchitis. On admission, given given history of CAD (with last cath 2008 showing nonobstructive disease involving LAD and RCA), uncontrolled DMII, HTN, HLD, obesity, and former tobacco abuse, presentation was initially concerning for acute cardiac pathology. Therefore, she was admitted to telemetry unit, started on a beta blocker, aspirin, statin, and PRN nitro and morphine. However. ACS was ruled out with CE neg x 3, neg EKG. CXR  was negative for acute pulmonary process. TSH was found to be within normal limits and patient prescribed NSAID for pain relief. Pain resolved by time of discharge. She will follow-up closely at [facility] and would benefit from continued outpatient cardiology follow-up for stress test.  2. Swelling of left lower extremity r/o LLE DVT - on admission, physical exam findings consistent with painful asymmetric edema left > R. LE doppler was desired, but unable to be performed over weekend, therefore, D-dimer was checked, and found to be elevated at 2.8. She was therefore started on heparin while inpatient, then transitioned  to Lovenox. Lovenox teaching was provided and she will continue this therapy until clinic follow-up (as noted above), at which time, doppler US should be checked, and anticoagulation adjusted as indicated.   3. Diabetes Mellitus, type 2 - uncontrolled, A1c 9. Continued on home insulin and SSI during hospital course, with good blood sugar control. Therefore, no adjustments made to home insulin regimen.  4. Hyperlipidemia - well controlled on current regimen, FLP was rechecked showigng LDL 74 mg/dL, therefore, she was continued on current home therapy.  5. Anemia - chronic and without acute decline. BL Hgb 11-11.7. At baseline currently.unclear cause, anemia panel ordered and pending at time of discharge, will need to follow up as outpatient.  6. Hypertension - initially elevated on admission due to pain (160s systolic), then improved. Was started on beta blocker in the setting of her nonobstructive CAD, with reduction of home lisinopril (to avoid hypotension with addition of this new therapy). Well controlled with new regimen. Will need to follow-up outpatient for further titration as indicated.  7. GERD - stable. Continued on home regimen.  8. Dizziness - unclear etiology. On admission, symptoms not consistent with BPPV, thought more likely secondary to visual disturbances from her glaucoma versus autonomic dysfunction from her poorly controlled DMII. Orthostatic vital signs were checked and found to be negative. Symptoms were stable at time of discharge.   DISCHARGE DATA: Vital Signs: BP 132/77  Pulse 78  Temp(Src) 97.3 F (36.3 C) (Oral)  Resp 22  Ht 5\' 6"  (1.676 m)  Wt 252 lb (114.306 kg)  BMI 40.67 kg/m2  SpO2 98%  LMP 02/13/2011  Labs: Results for orders placed during the hospital encounter of 10/28/11 (from the past 24 hour(s))  GLUCOSE, CAPILLARY     Status: Abnormal   Collection Time   10/28/11 10:46 PM      Component Value Range   Glucose-Capillary 271 (*) 70 - 99 (mg/dL)    Comment 1 Notify RN    TSH     Status: Normal   Collection Time   10/28/11 11:30 PM      Component Value Range   TSH 2.357  0.350 - 4.500 (uIU/mL)  CARDIAC PANEL(CRET KIN+CKTOT+MB+TROPI)     Status: Normal   Collection Time   10/28/11 11:30 PM      Component Value Range   Total CK 92  7 - 177 (U/L)   CK, MB 2.2  0.3 - 4.0 (ng/mL)   Troponin I <0.30  <0.30 (ng/mL)   Relative Index RELATIVE INDEX IS INVALID  0.0 - 2.5   D-DIMER, QUANTITATIVE     Status: Abnormal   Collection Time   10/28/11 11:30 PM      Component Value Range   D-Dimer, Quant 2.83 (*) 0.00 - 0.48 (ug/mL-FEU)  PROTIME-INR     Status: Normal   Collection Time   10/29/11  2:49 AM      Component  Value Range   Prothrombin Time 12.5  11.6 - 15.2 (seconds)   INR 0.91  0.00 - 1.49   LIPID PANEL     Status: Normal   Collection Time   10/29/11  6:10 AM      Component Value Range   Cholesterol 148  0 - 200 (mg/dL)   Triglycerides 161  <096 (mg/dL)   HDL 54  >04 (mg/dL)   Total CHOL/HDL Ratio 2.7     VLDL 20  0 - 40 (mg/dL)   LDL Cholesterol 74  0 - 99 (mg/dL)  CARDIAC PANEL(CRET KIN+CKTOT+MB+TROPI)     Status: Normal   Collection Time   10/29/11  6:10 AM      Component Value Range   Total CK 78  7 - 177 (U/L)   CK, MB 2.3  0.3 - 4.0 (ng/mL)   Troponin I <0.30  <0.30 (ng/mL)   Relative Index RELATIVE INDEX IS INVALID  0.0 - 2.5   GLUCOSE, CAPILLARY     Status: Abnormal   Collection Time   10/29/11  7:34 AM      Component Value Range   Glucose-Capillary 203 (*) 70 - 99 (mg/dL)  HEPARIN LEVEL (UNFRACTIONATED)     Status: Abnormal   Collection Time   10/29/11  8:35 AM      Component Value Range   Heparin Unfractionated 1.48 (*) 0.30 - 0.70 (IU/mL)  GLUCOSE, CAPILLARY     Status: Abnormal   Collection Time   10/29/11 11:25 AM      Component Value Range   Glucose-Capillary 238 (*) 70 - 99 (mg/dL)  CARDIAC PANEL(CRET KIN+CKTOT+MB+TROPI)     Status: Normal   Collection Time   10/29/11 11:39 AM      Component Value Range   Total  CK 84  7 - 177 (U/L)   CK, MB 2.4  0.3 - 4.0 (ng/mL)   Troponin I <0.30  <0.30 (ng/mL)   Relative Index RELATIVE INDEX IS INVALID  0.0 - 2.5     Time spent on discharge: 35 minutes   Signed: Johnette Abraham, D.O.  PGY II, Internal Medicine Resident 10/29/2011, 2:43 PM

## 2011-10-29 NOTE — Progress Notes (Signed)
PHARMACY - ANTICOAGULATION CONSULT Initial Consult Note  Pharmacy Consult for: Heparin  Indication: R/o pulmonary embolism    Patient Data:   Allergies: No Known Allergies  Patient Measurements: Height: 5\' 6"  (167.6 cm) Weight: 252 lb (114.306 kg) IBW/kg (Calculated) : 59.3  Heparin dosing weight: 86 kg  Vital Signs: Temp:  [97.6 F (36.4 C)-98.1 F (36.7 C)] 98.1 F (36.7 C) (05/03 2343) Pulse Rate:  [91-106] 100  (05/03 2349) Resp:  [16-20] 18  (05/03 2343) BP: (139-165)/(65-94) 161/92 mmHg (05/03 2349) SpO2:  [98 %-100 %] 100 % (05/03 2343) FiO2 (%):  [21 %] 21 % (05/03 2330) Weight:  [252 lb (114.306 kg)] 252 lb (114.306 kg) (05/03 2230)  Intake/Output from previous day: No intake or output data in the 24 hours ending 10/29/11 0141  Labs:  Basename 10/28/11 2330 10/28/11 1405 10/28/11 1143 10/28/11 1045  HGB -- -- -- 11.0*  HCT -- -- -- 34.0*  PLT -- -- -- 262  APTT -- -- -- --  LABPROT -- -- -- --  INR -- -- -- --  HEPARINUNFRC -- -- -- --  CREATININE -- -- -- 0.80  CKTOTAL 92 -- -- --  CKMB 2.2 -- -- --  TROPONINI <0.30 <0.30 <0.30 --   Estimated Creatinine Clearance: 105.6 ml/min (by C-G formula based on Cr of 0.8).  Medical History: Past Medical History  Diagnosis Date  . Hypertension   . Hyperlipidemia   . GERD (gastroesophageal reflux disease)   . Adhesive capsulitis of shoulder     bilateral, Steroid injection Dr. Nedra Hai 1/12 bilaterally  . Peripheral neuropathy   . Adhesive capsulitis of right shoulder     Steroid injection Dr. Nedra Hai 1/12  . Adhesive capsulitis of left shoulder     Steroid injection Dr. Nedra Hai 1/12  . Obesity   . Diabetic retinopathy   . Glaucoma   . Diabetes mellitus     Type II, insulin dependent  . CAD (coronary artery disease)     nonobstructive. Last cardiac cath (2008) showing left circumflex with mid 50% stenosis and distal luminal irregularities. Also with RCA with mid to distal 30-40% stenosis. // Previously evaluated  by [facility], never followed up outpatient.    Scheduled medications:     . amitriptyline  25 mg Oral QHS  . aspirin  324 mg Oral Once  . aspirin EC  81 mg Oral Daily  . dorzolamide-timolol  1 drop Right Eye BID  . gabapentin  600 mg Oral TID  . insulin aspart  0-9 Units Subcutaneous TID WC  . insulin glargine  35 Units Subcutaneous QHS  . lisinopril  20 mg Oral Daily  . metoprolol tartrate  12.5 mg Oral BID  .  morphine injection  4 mg Intravenous Once  . pantoprazole  40 mg Oral Q1200  . simvastatin  20 mg Oral q1800  . DISCONTD: aspirin  324 mg Oral NOW  . DISCONTD: aspirin  300 mg Rectal NOW  . DISCONTD: diphenhydrAMINE  25 mg Oral Once  . DISCONTD: enoxaparin  40 mg Subcutaneous Daily  . DISCONTD: oxyCODONE-acetaminophen  1 tablet Oral Once     Assessment:  51 y.o. female admitted on 10/28/2011, with chest pain and found to have elevated d-dimer. Pharmacy consulted to manage IV heparin. Baseline Hgb 11, and platelets 262. No baseline PT / INR currently, not on oral anticoagulants per med history.   Goal of Therapy:  1. Heparin level 0.3-0.7 units/ml  Plan:  1. Heparin 5000 units  IV x 1, then IV infusion at 1550 units/hr.  2. Heparin level in 6 hours.  3. Daily CBC, heparin level.  Dineen Kid Thad Ranger, PharmD 10/29/2011, 1:41 AM

## 2011-11-02 ENCOUNTER — Other Ambulatory Visit: Payer: Self-pay | Admitting: Internal Medicine

## 2011-11-02 ENCOUNTER — Encounter: Payer: Self-pay | Admitting: Internal Medicine

## 2011-11-02 ENCOUNTER — Ambulatory Visit (HOSPITAL_COMMUNITY)
Admission: RE | Admit: 2011-11-02 | Discharge: 2011-11-02 | Disposition: A | Discharge status: Home / Self Care | Payer: Medicare Other | Source: Ambulatory Visit | Attending: Internal Medicine | Admitting: Internal Medicine

## 2011-11-02 ENCOUNTER — Ambulatory Visit (INDEPENDENT_AMBULATORY_CARE_PROVIDER_SITE_OTHER): Payer: Medicare Other | Admitting: Internal Medicine

## 2011-11-02 VITALS — BP 123/74 | HR 96 | Temp 96.7°F | Ht 66.0 in | Wt 256.6 lb

## 2011-11-02 DIAGNOSIS — M7989 Other specified soft tissue disorders: Secondary | ICD-10-CM

## 2011-11-02 DIAGNOSIS — E1149 Type 2 diabetes mellitus with other diabetic neurological complication: Secondary | ICD-10-CM

## 2011-11-02 DIAGNOSIS — K219 Gastro-esophageal reflux disease without esophagitis: Secondary | ICD-10-CM

## 2011-11-02 DIAGNOSIS — Z1211 Encounter for screening for malignant neoplasm of colon: Secondary | ICD-10-CM

## 2011-11-02 DIAGNOSIS — I2510 Atherosclerotic heart disease of native coronary artery without angina pectoris: Secondary | ICD-10-CM | POA: Insufficient documentation

## 2011-11-02 DIAGNOSIS — E1140 Type 2 diabetes mellitus with diabetic neuropathy, unspecified: Secondary | ICD-10-CM

## 2011-11-02 DIAGNOSIS — E1165 Type 2 diabetes mellitus with hyperglycemia: Secondary | ICD-10-CM

## 2011-11-02 DIAGNOSIS — E1142 Type 2 diabetes mellitus with diabetic polyneuropathy: Secondary | ICD-10-CM

## 2011-11-02 DIAGNOSIS — I10 Essential (primary) hypertension: Secondary | ICD-10-CM

## 2011-11-02 DIAGNOSIS — R079 Chest pain, unspecified: Secondary | ICD-10-CM

## 2011-11-02 MED ORDER — ASPIRIN 81 MG PO TABS: 81.0000 mg | ORAL_TABLET | Freq: Every day | ORAL | Status: DC

## 2011-11-02 NOTE — Progress Notes (Signed)
VASCULAR LAB PRELIMINARY  PRELIMINARY  PRELIMINARY  PRELIMINARY  Left lower extremity venous duplex completed.    Preliminary report:  Left:  No evidence of DVT, superficial thrombosis, or Baker's cyst.  Terance Hart, RVT 11/02/2011, 10:04 AM

## 2011-11-02 NOTE — Assessment & Plan Note (Signed)
Lab Results  Component Value Date   NA 137 10/28/2011   K 4.7 10/28/2011   CL 102 10/28/2011   CO2 24 10/28/2011   BUN 21 10/28/2011   CREATININE 0.80 10/28/2011   CREATININE 0.79 07/13/2011    BP Readings from Last 3 Encounters:  11/02/11 123/74  10/29/11 132/77  10/19/11 147/83    Assessment: Hypertension control:  controlled  Progress toward goals:  at goal Barriers to meeting goals:  no barriers identified  Plan: Hypertension treatment:  continue current medications

## 2011-11-02 NOTE — Assessment & Plan Note (Signed)
Lab Results  Component Value Date   HGBA1C 9.0 10/19/2011   HGBA1C 8.5 06/10/2010   CREATININE 0.80 10/28/2011   CREATININE 0.79 07/13/2011   MICROALBUR 0.50 06/10/2010   MICRALBCREAT 3.4 06/10/2010   CHOL 148 10/29/2011   HDL 54 10/29/2011   TRIG 102 10/29/2011     Assessment: Diabetes control: not controlled Progress toward goals: unable to assess Barriers to meeting goals: lack of understanding of disease management  Plan: Diabetes treatment: continue current medications Refer to: none Instruction/counseling given: reminded to bring blood glucose meter & log to each visit, reminded to bring medications to each visit, discussed the need for weight loss and discussed diet

## 2011-11-02 NOTE — Patient Instructions (Addendum)
-  Great news!  The test you had today does not suggest that you have a blood clot in your leg. We can discontinue Lovenox.  -Be sure to followup with the cardiologist so that we can have a stress test done on your heart.  -Please return in July to see your primary care provider.  This appointment will also be your diabetes followup.   -Keep thinking about the colonoscopy. It is important for [place] to screen for colon cancer, especially given your borderline low blood count and history of diarrhea.  Please be sure to bring all of your medications with you to every visit.  Should you have any new or worsening symptoms, please be sure to call the clinic at [phone].

## 2011-11-02 NOTE — Progress Notes (Signed)
Subjective:   Patient ID: [name] female   DOB: January 20, 1961 51 y.o.   MRN: [id number]  HPI: [name] is a 51 y.o. woman with a past medical history significant for coronary artery disease, hypertension, hyperlipidemia, diabetes and GERD. She was recently discharged from the hospital on 10/29/2011, and she is here for hospital followup.  Since hospital discharge her chest pain has resolved. She denies shortness of breath, palpitations, dizziness, and headaches.  She continues to feel that both of her legs are swollen. This has been a long-standing problem. She says the swelling and pain in the left lower extremity is improved from hospital discharge. The pain in her lower extremity is worse when climbing up the stairs. She describes pain as soreness.   Current Outpatient Prescriptions  Medication Sig Dispense Refill  . amitriptyline (ELAVIL) 25 MG tablet Take 1 tablet (25 mg total) by mouth at bedtime.  30 tablet  1  . cyclobenzaprine (FLEXERIL) 10 MG tablet Take 1 tablet (10 mg total) by mouth every 8 (eight) hours as needed for muscle spasms.  30 tablet  1  . dorzolamide-timolol (COSOPT) 22.3-6.8 MG/ML ophthalmic solution Place 1 drop into the right eye 2 (two) times daily.      Marland Kitchen enoxaparin (LOVENOX) 150 MG/ML injection Inject 0.76 mLs (115 mg total) into the skin every 12 (twelve) hours.  8 Syringe  0  . gabapentin (NEURONTIN) 600 MG tablet Take 1 tablet (600 mg total) by mouth 3 (three) times daily.  90 tablet  3  . insulin aspart (NOVOLOG FLEXPEN) 100 UNIT/ML injection Inject 3 units into the skin 10 mins before breakfast, 2 units 10 mins before lunch and 5 units 10 mins before dinner  15 mL  12  . insulin glargine (LANTUS) 100 UNIT/ML injection Inject 35 Units into the skin at bedtime.  10 mL  12  . lisinopril (PRINIVIL,ZESTRIL) 20 MG tablet Take 1 tablet (20 mg total) by mouth daily.  30 tablet  11  . metFORMIN (GLUCOPHAGE) 1000 MG tablet Take 1 tablet (1,000 mg total)  by mouth 2 (two) times daily with a meal.  60 tablet  5  . metoprolol tartrate (LOPRESSOR) 25 MG tablet Take 0.5 tablets (12.5 mg total) by mouth 2 (two) times daily.  30 tablet  1  . naproxen (NAPROSYN) 375 MG tablet Take 1 tablet (375 mg total) by mouth 2 (two) times daily as needed (pain). With meals  60 tablet  0  . omeprazole (PRILOSEC) 20 MG capsule Take 2 capsules (40 mg total) by mouth daily.  30 capsule  11  . pravastatin (PRAVACHOL) 40 MG tablet Take 1 tablet (40 mg total) by mouth daily.  30 tablet  6  . PRODIGY MINI PEN NEEDLES 31G X 5 MM MISC USE TO INJECT INSULIN THREE TIMES DAILY.  100 each  10  . promethazine (PHENERGAN) 12.5 MG tablet Take 1 tablet (12.5 mg total) by mouth every 6 (six) hours as needed for nausea.  30 tablet  3  . traMADol (ULTRAM) 50 MG tablet Take 1 tablet (50 mg total) by mouth every 6 (six) hours as needed for pain (for severe pain).  60 tablet  0  . DISCONTD: promethazine (PHENERGAN) 12.5 MG tablet Take 1 tablet (12.5 mg total) by mouth every 8 (eight) hours as needed for nausea.  15 tablet  0   Review of Systems: Constitutional: Denies fever, chills, diaphoresis, appetite change and fatigue.  HEENT: Denies photophobia, eye pain, redness, hearing loss,  ear pain, congestion, sore throat, rhinorrhea, sneezing, mouth sores, trouble swallowing, neck pain, neck stiffness and tinnitus.   Respiratory: Denies cough, chest tightness,  and wheezing.  Has chronic DOE Cardiovascular: Denies chest pain, palpitations   Gastrointestinal: Denies nausea, vomiting, abdominal pain, diarrhea, constipation, blood in stool and abdominal distention.  Genitourinary: Denies dysuria, urgency, frequency, hematuria, flank pain and difficulty urinating.  Musculoskeletal: Denies myalgias, back pain, joint swelling, arthralgias and gait problem.  Skin: Denies pallor, rash and wound.  Neurological: Denies dizziness, seizures, syncope, weakness, light-headedness, numbness and headaches.    Psychiatric/Behavioral: Denies suicidal ideation, mood changes, confusion, nervousness, sleep disturbance and agitation  Objective:  Physical Exam: Filed Vitals:   11/02/11 0823  BP: 123/74  Pulse: 96  Temp: 96.7 F (35.9 C)  TempSrc: Oral  Height: 5\' 6"  (1.676 m)  Weight: 256 lb 9.6 oz (116.393 kg)  SpO2: 96%   Constitutional: Vital signs reviewed.  Patient is an obese woman in no acute distress and cooperative with exam.  Mouth: no erythema or exudates, MMM Eyes: PERRL, EOMI, conjunctivae normal, No scleral icterus.  Neck: No thyromegaly Cardiovascular: RRR, S1 normal, S2 normal, no MRG, pulses symmetric and intact bilaterally Pulmonary/Chest: CTAB, no wheezes, rales, or rhonchi Abdominal: Soft. Non-tender, non-distended, bowel sounds are normal, no masses, organomegaly, or guarding present.  Musculoskeletal: No joint deformities, erythema, or stiffness; bilateral lower extremity 1+ pitting edema, both legs are tender to deep palpation, equal bilateral calf circumference  Neurological: A&O x3 Skin: Warm, dry and intact. No rash, cyanosis, or clubbing.  Psychiatric: Patient seems anxious, and she is worried about a clot in her leg  Assessment & Plan:  Case and care discussed with Dr. Josem Kaufmann. Patient was referred to cardiologist at today's appointment. A Doppler did not suggest a lower extremity DVT. And to return in July to see her primary care provider for diabetes. I tried you discuss referral for colonoscopy at today's visit, but she would like to get the issue sorted out the cardiologist first. Please see problem-oriented charting for further details.

## 2011-11-02 NOTE — Assessment & Plan Note (Signed)
Chest pain the patient had during hospitalization has resolved. She was referred to cardiology for outpatient stress test in setting of known coronary artery disease.

## 2011-11-02 NOTE — Assessment & Plan Note (Addendum)
On physical exam this morning, patient had bilateral lower extremity 1+ pitting edema. Calf size with both lower extremities were equal. Given findings during hospitalization, a stat left lower extremity Doppler was performed this morning. Preliminary results indicated no DVT. Patient to discontinue Lovenox.  Because I am discontinuing Lovenox, I am not following up with post hospital CBC.

## 2011-11-02 NOTE — Assessment & Plan Note (Signed)
I discussed referral for colonoscopy today. Patient is not interested at this time, she would like to have issues with cardiologist sorted out first.

## 2011-11-18 ENCOUNTER — Other Ambulatory Visit: Payer: Self-pay | Admitting: Internal Medicine

## 2011-11-25 ENCOUNTER — Institutional Professional Consult (permissible substitution): Payer: Medicare Other | Admitting: Cardiovascular Disease

## 2011-11-28 NOTE — Progress Notes (Signed)
Addended by: Neomia Dear on: 11/28/2011 03:08 PM   Modules accepted: Orders

## 2011-12-02 ENCOUNTER — Telehealth: Payer: Self-pay | Admitting: Dietician

## 2011-12-13 ENCOUNTER — Other Ambulatory Visit: Payer: Self-pay | Admitting: Internal Medicine

## 2012-01-09 ENCOUNTER — Ambulatory Visit (INDEPENDENT_AMBULATORY_CARE_PROVIDER_SITE_OTHER): Payer: Medicare Other | Admitting: Internal Medicine

## 2012-01-09 ENCOUNTER — Encounter: Payer: Self-pay | Admitting: Internal Medicine

## 2012-01-09 VITALS — BP 127/71 | HR 112 | Temp 97.0°F | Wt 258.3 lb

## 2012-01-09 DIAGNOSIS — E1142 Type 2 diabetes mellitus with diabetic polyneuropathy: Secondary | ICD-10-CM

## 2012-01-09 DIAGNOSIS — E1165 Type 2 diabetes mellitus with hyperglycemia: Secondary | ICD-10-CM

## 2012-01-09 DIAGNOSIS — E1140 Type 2 diabetes mellitus with diabetic neuropathy, unspecified: Secondary | ICD-10-CM

## 2012-01-09 DIAGNOSIS — I10 Essential (primary) hypertension: Secondary | ICD-10-CM

## 2012-01-09 DIAGNOSIS — E785 Hyperlipidemia, unspecified: Secondary | ICD-10-CM

## 2012-01-09 DIAGNOSIS — E1149 Type 2 diabetes mellitus with other diabetic neurological complication: Secondary | ICD-10-CM

## 2012-01-09 DIAGNOSIS — R609 Edema, unspecified: Secondary | ICD-10-CM

## 2012-01-09 DIAGNOSIS — M7989 Other specified soft tissue disorders: Secondary | ICD-10-CM

## 2012-01-09 DIAGNOSIS — R600 Localized edema: Secondary | ICD-10-CM

## 2012-01-09 LAB — POCT GLYCOSYLATED HEMOGLOBIN (HGB A1C): Hemoglobin A1C: 9.4

## 2012-01-09 LAB — GLUCOSE, CAPILLARY: Glucose-Capillary: 156 mg/dL — ABNORMAL HIGH (ref 70–99)

## 2012-01-09 MED ORDER — FUROSEMIDE 20 MG PO TABS: 20.0000 mg | ORAL_TABLET | Freq: Every day | ORAL | Status: DC

## 2012-01-09 NOTE — Assessment & Plan Note (Signed)
Patient blood pressure is under control. Today her blood pressure is 127/71. We'll continue current regimen.

## 2012-01-09 NOTE — Progress Notes (Signed)
Patient ID: [name], female   DOB: 05/06/1961, 51 y.o.   MRN: [id number]  Subjective:   Patient ID: [name] female   DOB: 11-08-1960 51 y.o.   MRN: [id number]  HPI: [name] is a 51 y.o. with a past medical history as outlined below, who presents for evaluation of her leg edema.  Patient reports that she has been having leg edema for more than 2 months. She reports that she has mild shortness of breath and palpitation when she walks for long distance. She does not have chest pain, PND or orthopnea. Her liver function tests were normal on 04/05/11 with normal AST, ALT and albumin. Her kidney function was normal at 10/28/11 with normal creatinine and a GFR. Recently patient was hospitalized because of the chest pain at 10/28/11. She had a negative workup in the hospital with the negative cardiac exam and EKG for ischemia, as well as normal TSH. Patient was given a referral to cardiology for further evaluation for possible ischemic heart disease. But the patient did not keep the appointment with cardiology.  Regarding her hypertension, patient is currently taking lisinopril 20 mg daily, metoprolol 12.5 mg twice a day. Today her blood pressure is 127/71.  Regarding her hyperlipidemia, patient is currently taking pravastatin, 40 mg daily. She did not noticed any side effects, such as muscle pain.  Past Medical History  Diagnosis Date  . Hypertension   . Hyperlipidemia   . GERD (gastroesophageal reflux disease)   . Adhesive capsulitis of shoulder     bilateral, Steroid injection Dr. Nedra Hai 1/12 bilaterally  . Peripheral neuropathy   . Adhesive capsulitis of right shoulder     Steroid injection Dr. Nedra Hai 1/12  . Adhesive capsulitis of left shoulder     Steroid injection Dr. Nedra Hai 1/12  . Obesity   . Diabetic retinopathy   . Glaucoma   . Diabetes mellitus     Type II, insulin dependent  . CAD (coronary artery disease)     nonobstructive. Last cardiac cath (2008) showing left  circumflex with mid 50% stenosis and distal luminal irregularities. Also with RCA with mid to distal 30-40% stenosis. // Previously evaluated by [facility], never followed up outpatient.   Current Outpatient Prescriptions  Medication Sig Dispense Refill  . amitriptyline (ELAVIL) 25 MG tablet TAKE ONE TABLET BY MOUTH AT BEDTIME  30 tablet  6  . aspirin 81 MG tablet Take 1 tablet (81 mg total) by mouth daily.  30 tablet    . cyclobenzaprine (FLEXERIL) 10 MG tablet TAKE ONE TABLET EVERY 8 HOURS AS NEEDED FOR MUSCLE SPASMS  30 tablet  6  . dorzolamide-timolol (COSOPT) 22.3-6.8 MG/ML ophthalmic solution Place 1 drop into the right eye 2 (two) times daily.      Marland Kitchen gabapentin (NEURONTIN) 600 MG tablet Take 1 tablet (600 mg total) by mouth 3 (three) times daily.  90 tablet  3  . insulin aspart (NOVOLOG FLEXPEN) 100 UNIT/ML injection Inject 3 units into the skin 10 mins before breakfast, 2 units 10 mins before lunch and 5 units 10 mins before dinner  15 mL  12  . insulin glargine (LANTUS) 100 UNIT/ML injection Inject 35 Units into the skin at bedtime.  10 mL  12  . lisinopril (PRINIVIL,ZESTRIL) 20 MG tablet Take 1 tablet (20 mg total) by mouth daily.  30 tablet  11  . metFORMIN (GLUCOPHAGE) 1000 MG tablet Take 1 tablet (1,000 mg total) by mouth 2 (two) times daily with a  meal.  60 tablet  5  . metoprolol tartrate (LOPRESSOR) 25 MG tablet Take 0.5 tablets (12.5 mg total) by mouth 2 (two) times daily.  30 tablet  1  . naproxen (NAPROSYN) 375 MG tablet Take 1 tablet (375 mg total) by mouth 2 (two) times daily as needed (pain). With meals  60 tablet  0  . omeprazole (PRILOSEC) 20 MG capsule Take 2 capsules (40 mg total) by mouth daily.  30 capsule  11  . pravastatin (PRAVACHOL) 40 MG tablet Take 1 tablet (40 mg total) by mouth daily.  30 tablet  6  . PRODIGY MINI PEN NEEDLES 31G X 5 MM MISC USE TO INJECT INSULIN THREE TIMES DAILY.  100 each  10  . promethazine (PHENERGAN) 12.5 MG tablet Take 1 tablet  (12.5 mg total) by mouth every 6 (six) hours as needed for nausea.  30 tablet  3  . traMADol (ULTRAM) 50 MG tablet TAKE ONE TABLET EVERY 6 HOURS AS NEEDED FOR SEVERE PAIN  30 tablet  6  . furosemide (LASIX) 20 MG tablet Take 1 tablet (20 mg total) by mouth daily.  30 tablet  3  . DISCONTD: furosemide (LASIX) 20 MG tablet Take 1 tablet (20 mg total) by mouth daily.  30 tablet  11  . DISCONTD: promethazine (PHENERGAN) 12.5 MG tablet Take 1 tablet (12.5 mg total) by mouth every 8 (eight) hours as needed for nausea.  15 tablet  0   Family History  Problem Relation Age of Onset  . Hypertension Sister   . Hypertension Sister   . Hypertension Sister    History   Social History  . Marital Status: Single    Spouse Name: N/A    Number of Children: 2  . Years of Education: 12th grade   Occupational History  . 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    Social History Main Topics  . Smoking status: Former Smoker -- 0.3 packs/day for 7 years    Types: Cigarettes    Quit date: 06/27/2006  . Smokeless tobacco: Never Used  . Alcohol Use: No  . Drug Use: No  . Sexually Active: Not Currently   Other Topics Concern  . None   Social History Narrative   Lives with her kids, 28yo daughter and 57 yo adopted son.   Review of Systems: ROS: General: no fevers, chills, no changes in body weight, no changes in appetite Skin: no rash HEENT: no blurry vision, hearing changes or sore throat Pulm: no dyspnea, coughing, wheezing CV: no chest pain, palpitations, has shortness of breath when walk for long distance. Abd: no nausea/vomiting, abdominal pain, diarrhea/constipation GU: no dysuria, hematuria, polyuria Ext: has leg edema bilaterally. Neuro: no weakness, numbness, or tingling \  Objective:  Physical Exam: Filed Vitals:   01/09/12 1442  BP: 127/71  Pulse: 112  Temp: 97 F (36.1 C)  TempSrc: Oral  Weight: 258 lb 4.8 oz  (117.164 kg)    General: resting in bed, not in acute distress HEENT: PERRL, EOMI, no scleral icterus Cardiac: S1/S2, tachycardia. RRR, No murmurs, gallops or rubs Pulm: Good air movement bilaterally, Clear to auscultation bilaterally, No rales, wheezing, rhonchi or rubs. Abd: Soft,  nondistended, nontender, no rebound pain, no organomegaly, BS present Ext: No rashes, 2+DP/PT pulse bilaterally. 2+ bilateral pitting edema in legs.  Musculoskeletal: No joint deformities, erythema, or stiffness, ROM full and no nontender Skin: no rashes. No skin  bruise. Neuro: alert and oriented X3, cranial nerves II-XII grossly intact, muscle strength 5/5 in all extremeties,  sensation to light touch intact.  Psych.: patient is not psychotic, no suicidal or hemocidal ideation.   Assessment & Plan:

## 2012-01-09 NOTE — Patient Instructions (Signed)
1. Please keep your appointment with cardiology. Please start taking lasix 20 mg daily from now. 2. Please take all medications as prescribed.  3. If you have worsening of your symptoms or new symptoms arise, please call the clinic [phone]), or go to the ER immediately if symptoms are severe.

## 2012-01-09 NOTE — Assessment & Plan Note (Signed)
Etiology for her bilateral leg edema is not completely clear. But it does not seem to be venous insufficiency. It is unlikely due to liver dysfunction or renal dysfunction given recent normal liver function tests and normal creatinine/GFR. Patient had NM Myoview test on 2003 showed EF of 45% with a possible inferior wall scarring. It is possible that patient may have early stage of congestive heart failure. The underlying causes could be due to hypertension and ischemic heart disease. Patient has significant risk factors for coronary artery disease, including hypertension, hyperlipidemia and diabetes. Will treat patient's with Lasix 20 mg daily. We'll give patient a referral to cardiology for possible stress test.

## 2012-01-09 NOTE — Assessment & Plan Note (Signed)
It is well controlled. Currently patient is taking pravastatin 40 mg daily. Her recent LDL was 74 at 10/29/11. She did not noticed any side effects, such as muscle pain. We'll continue current regimen.

## 2012-01-12 NOTE — Telephone Encounter (Signed)
Called to discuss appointment with CDE and foot exam due. Patient goes to podiatrist. Appointment made for 01-20-12 with CDE.

## 2012-01-20 ENCOUNTER — Ambulatory Visit: Payer: Medicare Other | Admitting: Dietician

## 2012-01-20 ENCOUNTER — Other Ambulatory Visit: Payer: Self-pay | Admitting: *Deleted

## 2012-01-22 ENCOUNTER — Emergency Department (HOSPITAL_COMMUNITY): Payer: Medicare Other

## 2012-01-22 ENCOUNTER — Encounter (HOSPITAL_COMMUNITY): Payer: Self-pay | Admitting: *Deleted

## 2012-01-22 ENCOUNTER — Inpatient Hospital Stay (HOSPITAL_COMMUNITY)
Admission: EM | Admit: 2012-01-22 | Discharge: 2012-01-25 | DRG: 287 | Disposition: A | Discharge status: Home / Self Care | Payer: Medicare Other | Attending: Internal Medicine | Admitting: Internal Medicine

## 2012-01-22 DIAGNOSIS — G609 Hereditary and idiopathic neuropathy, unspecified: Secondary | ICD-10-CM | POA: Diagnosis present

## 2012-01-22 DIAGNOSIS — Z794 Long term (current) use of insulin: Secondary | ICD-10-CM

## 2012-01-22 DIAGNOSIS — R079 Chest pain, unspecified: Secondary | ICD-10-CM

## 2012-01-22 DIAGNOSIS — I509 Heart failure, unspecified: Secondary | ICD-10-CM

## 2012-01-22 DIAGNOSIS — E119 Type 2 diabetes mellitus without complications: Secondary | ICD-10-CM

## 2012-01-22 DIAGNOSIS — E11319 Type 2 diabetes mellitus with unspecified diabetic retinopathy without macular edema: Secondary | ICD-10-CM | POA: Diagnosis present

## 2012-01-22 DIAGNOSIS — F17200 Nicotine dependence, unspecified, uncomplicated: Secondary | ICD-10-CM | POA: Diagnosis present

## 2012-01-22 DIAGNOSIS — I5021 Acute systolic (congestive) heart failure: Principal | ICD-10-CM | POA: Diagnosis present

## 2012-01-22 DIAGNOSIS — E1139 Type 2 diabetes mellitus with other diabetic ophthalmic complication: Secondary | ICD-10-CM | POA: Diagnosis present

## 2012-01-22 DIAGNOSIS — K219 Gastro-esophageal reflux disease without esophagitis: Secondary | ICD-10-CM | POA: Diagnosis present

## 2012-01-22 DIAGNOSIS — Z6841 Body Mass Index (BMI) 40.0 and over, adult: Secondary | ICD-10-CM

## 2012-01-22 DIAGNOSIS — Z7982 Long term (current) use of aspirin: Secondary | ICD-10-CM

## 2012-01-22 DIAGNOSIS — I10 Essential (primary) hypertension: Secondary | ICD-10-CM | POA: Diagnosis present

## 2012-01-22 DIAGNOSIS — I428 Other cardiomyopathies: Secondary | ICD-10-CM | POA: Diagnosis present

## 2012-01-22 DIAGNOSIS — E669 Obesity, unspecified: Secondary | ICD-10-CM | POA: Diagnosis present

## 2012-01-22 DIAGNOSIS — I2510 Atherosclerotic heart disease of native coronary artery without angina pectoris: Secondary | ICD-10-CM | POA: Diagnosis present

## 2012-01-22 DIAGNOSIS — E785 Hyperlipidemia, unspecified: Secondary | ICD-10-CM | POA: Diagnosis present

## 2012-01-22 DIAGNOSIS — Z79899 Other long term (current) drug therapy: Secondary | ICD-10-CM

## 2012-01-22 DIAGNOSIS — E1165 Type 2 diabetes mellitus with hyperglycemia: Secondary | ICD-10-CM | POA: Diagnosis present

## 2012-01-22 DIAGNOSIS — E1140 Type 2 diabetes mellitus with diabetic neuropathy, unspecified: Secondary | ICD-10-CM | POA: Diagnosis present

## 2012-01-22 LAB — POCT I-STAT TROPONIN I

## 2012-01-22 LAB — BASIC METABOLIC PANEL
CO2: 25 mEq/L (ref 19–32)
Calcium: 9.6 mg/dL (ref 8.4–10.5)
Creatinine, Ser: 0.94 mg/dL (ref 0.50–1.10)
Glucose, Bld: 347 mg/dL — ABNORMAL HIGH (ref 70–99)

## 2012-01-22 LAB — CBC
Hemoglobin: 11.6 g/dL — ABNORMAL LOW (ref 12.0–15.0)
MCH: 28.7 pg (ref 26.0–34.0)
MCV: 86.6 fL (ref 78.0–100.0)
RBC: 4.04 MIL/uL (ref 3.87–5.11)

## 2012-01-22 MED ORDER — NITROGLYCERIN 0.4 MG SL SUBL
0.4000 mg | SUBLINGUAL_TABLET | SUBLINGUAL | Status: DC | PRN
  Administered 2012-01-22: 0.4 mg via SUBLINGUAL

## 2012-01-22 MED ORDER — ASPIRIN 81 MG PO CHEW
324.0000 mg | CHEWABLE_TABLET | Freq: Once | ORAL | Status: AC
  Administered 2012-01-22: 324 mg via ORAL
  Filled 2012-01-22: qty 4

## 2012-01-22 NOTE — ED Notes (Signed)
EDP at bedside  

## 2012-01-22 NOTE — ED Notes (Signed)
Pt reports mid chest pains recently, has been seen by pcp recently for it, had ekg done and put on fluid pills. Reports having swelling to legs, sob that occurs when lying down. ekg being done at triage, airway is intact, speaking in full sentences.

## 2012-01-22 NOTE — ED Notes (Signed)
Admitting MD at bedside.

## 2012-01-22 NOTE — ED Notes (Signed)
Pt states that she started having CP today. Pt states that the pain is center chest and radiates down left arm. Pt states that the pain is 10/10. Pt states that she was taking lasix for her bilateral swelling in her legs, ankles and feet. Pt states that it has helped some but she has been noticing that when the swelling increases so does her pain. Pt states that she is SOB, can't walk for the same distances as she use to without getting SOB. Pt denies hx of CHF. Pt alert and oriented.

## 2012-01-22 NOTE — H&P (Signed)
Hospital Admission Note Date: 01/22/2012  Patient name: [name] Medical record number: [id number] Date of birth: [birth year] Age: 51 y.o. Gender: female PCP: Kristie Cowman, MD  Medical Service: [facility]   Attending physician: Dr. Rogelia Boga    Internal Medicine Teaching Service Contact Information  1st Contact:  Dr. Lavena Bullion  Pager: [phone] 2nd Contact:  Dr. Saralyn Pilar Pager: [phone] After 5 pm or weekends: 1st Contact:      Pager: [phone] 2nd Contact:      Pager: [phone]  Chief Complaint: CP  History of Present Illness: [name] 51 yo AA woman pmh HTN, HLD, DM type 2, GERD p/w worsening CP. She describes the pain as a crushing pressure/ squeezing characteristic starting in her center chest and then radiating to her L arm. She states that pain has been intermittent throughout the whole day. Exacerbated by movement and semi relieved with rest. She is diaphoretic sometimes with the pressure and has some SOB. She was nauseous and had one episode of vomiting that was relieved with some home meds. She has had several complaints of tachycardic sensations with no palpitations that are worse w/exercise and that can sometimes increase her SOB and produce some leg swelling. That was addressed at her last clinic visit and she was started on Lasix that she states has helped. She has had a HA for the past 2 days that is semi relieved with ASA that she tries to take daily for her heart. She doesn't describe any new stresses in her life. She said that her mother had clotting disorder that caused early MIs. Her pain was semi relieved with a NTG that she received in the ED.  Meds: Current Outpatient Rx  Name Route Sig Dispense Refill  . AMITRIPTYLINE HCL 25 MG PO TABS Oral Take 25 mg by mouth at bedtime.    . ASPIRIN 81 MG PO TABS Oral Take 81 mg by mouth daily.    . CYCLOBENZAPRINE HCL 10 MG PO TABS Oral Take 10 mg by mouth every 8 (eight) hours as needed. For muscle spasms    .  DORZOLAMIDE HCL-TIMOLOL MAL 22.3-6.8 MG/ML OP SOLN Right Eye Place 1 drop into the right eye 2 (two) times daily.    Marland Kitchen FERROUS SULFATE 325 (65 FE) MG PO TABS Oral Take 325 mg by mouth daily with breakfast.    . FUROSEMIDE 20 MG PO TABS Oral Take 20 mg by mouth daily.    Marland Kitchen GABAPENTIN 600 MG PO TABS Oral Take 600 mg by mouth 2 (two) times daily.    . INSULIN ASPART 100 UNIT/ML [place] SOLN Subcutaneous Inject 3-6 Units into the skin 3 (three) times daily before meals. Per sliding scale    . INSULIN GLARGINE 100 UNIT/ML [place] SOLN Subcutaneous Inject 35 Units into the skin at bedtime. 10 mL 12  . LISINOPRIL 20 MG PO TABS Oral Take 20 mg by mouth daily.    Marland Kitchen METFORMIN HCL 1000 MG PO TABS Oral Take 1 tablet (1,000 mg total) by mouth 2 (two) times daily with a meal. 60 tablet 5  . METOPROLOL TARTRATE 25 MG PO TABS Oral Take 12.5 mg by mouth 2 (two) times daily.    Marland Kitchen NAPROXEN 375 MG PO TABS Oral Take 375 mg by mouth 2 (two) times daily as needed. With meals    . OMEPRAZOLE 20 MG PO CPDR Oral Take 2 capsules (40 mg total) by mouth daily. 30 capsule 11  . PRAVASTATIN SODIUM 20 MG PO TABS Oral Take  20 mg by mouth daily.    Marland Kitchen PROMETHAZINE HCL 12.5 MG PO TABS Oral Take 12.5 mg by mouth every 6 (six) hours as needed. For nausea    . TRAMADOL HCL 50 MG PO TABS Oral Take 50 mg by mouth every 6 (six) hours as needed. For pain      Allergies: Allergies as of 01/22/2012  . (No Known Allergies)   Past Medical History  Diagnosis Date  . Hypertension   . Hyperlipidemia   . GERD (gastroesophageal reflux disease)   . Adhesive capsulitis of shoulder     bilateral, Steroid injection Dr. Nedra Hai 1/12 bilaterally  . Peripheral neuropathy   . Adhesive capsulitis of right shoulder     Steroid injection Dr. Nedra Hai 1/12  . Adhesive capsulitis of left shoulder     Steroid injection Dr. Nedra Hai 1/12  . Obesity   . Diabetic retinopathy   . Glaucoma   . Diabetes mellitus     Type II, insulin dependent  . CAD (coronary artery  disease)     nonobstructive. Last cardiac cath (2008) showing left circumflex with mid 50% stenosis and distal luminal irregularities. Also with RCA with mid to distal 30-40% stenosis. // Previously evaluated by [facility], never followed up outpatient.   Past Surgical History  Procedure Date  . Cataract extraction   . Glaucoma surgery   . Cardiac catheterization   . Tubal ligation    Family History  Problem Relation Age of Onset  . Hypertension Sister   . Hypertension Sister   . Hypertension Sister    History   Social History  . Marital Status: Single    Spouse Name: N/A    Number of Children: 2  . Years of Education: 12th grade   Occupational History  . 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    Social History Main Topics  . Smoking status: Former Smoker -- 0.3 packs/day for 7 years    Types: Cigarettes    Quit date: 06/27/2006  . Smokeless tobacco: Never Used  . Alcohol Use: No  . Drug Use: No  . Sexually Active: Not Currently   Other Topics Concern  . Not on file   Social History Narrative   Lives with her kids, 28yo daughter and 32 yo adopted son.    Review of Systems: Pertinent items are noted in HPI.  Physical Exam: Blood pressure 111/57, pulse 99, temperature 97.9 F (36.6 C), temperature source Oral, resp. rate 19, last menstrual period 02/13/2011, SpO2 99.00%. General: resting in bed, comfortable, NAD HEENT: PERRL, EOMI, no scleral icterus, no JVD Cardiac: RRR, no rubs, murmurs or gallops Pulm: CTAB, moving normal volumes of air Abd: soft, nontender, nondistended, BS present Ext: warm and well perfused, no pedal edema Neuro: alert and oriented X3, cranial nerves II-XII grossly intact  Lab results: Basic Metabolic Panel:  Basename 01/22/12 1837  NA 134*  K 4.3  CL 97  CO2 25  GLUCOSE 347*  BUN 27*  CREATININE 0.94  CALCIUM 9.6  MG --  PHOS --   CBC:  Basename  01/22/12 1837  WBC 13.1*  NEUTROABS --  HGB 11.6*  HCT 35.0*  MCV 86.6  PLT 293   Cardiac Enzymes: Troponin [facility] Test)  [name] 01/22/12 2203  TROPIPOC 0.00   BNP:  [facility] 01/22/12 1837  PROBNP 13.8   Imaging results:  Dg Chest 2 View  01/22/2012  *RADIOLOGY  REPORT*  Clinical Data: Chest pain and shortness of breath.  CHEST - 2 VIEW  Comparison: 10/28/2011.  Findings: The cardiac silhouette, mediastinal and hilar contours are within normal limits and stable.  Low lung volumes with vascular crowding and atelectasis.  There are chronic bronchitic changes related to smoking.  No infiltrates, edema or effusions. The bony thorax is intact.  IMPRESSION: Chronic bronchitic type lung changes related to smoking.  No acute pulmonary findings.  Original Report Authenticated By: P. Loralie Champagne, M.D.    Other results: EKG: normal EKG, normal sinus rhythm, Q waves only in III.  Assessment & Plan by Problem: 1. Chest pain - Concerning for cardiac pain although going on for whole day. Will rule out ACS and consider further workup. Last cath was 2008 and showed left circumflex mid 50% stenosis and distal luminal irregularities. Also with RCA with mid to distal 30-40% stenosis. EKG without changes.  -Cycle CE  -Telemetry overnight  -O2, Nitro, Morphine as needed   2. Hypertension - Likely does not need lasix and her BP is low in ED. Will hold her anti-hypertensives for now.   3. DM II - Continue lantus at lowered dose of 30 units nightly and resistant SSI. Hold metformin for now. CBG 350 in ED. Last HgA1c was 9.4.   4. Hyperlipidemia - Continue statin in hospital.   5. CAD - See above, last cath in 2008 with non-obstructive disease. Continue ASA daily and hold anti-hypertensives.   6. DVT ppx - Heparin [place] TID 5000 units  Signed: Christen Bame 01/22/2012, 11:58 PM

## 2012-01-22 NOTE — ED Provider Notes (Signed)
History     CSN: [id number]  Arrival date & time 01/22/12  1191   First MD Initiated Contact with Patient 01/22/12 2117      Chief Complaint  Patient presents with  . Chest Pain  . Shortness of Breath    (Consider location/radiation/quality/duration/timing/severity/associated sxs/prior treatment) Patient is a 51 y.o. female presenting with chest pain. The history is provided by the patient.  Chest Pain The chest pain began 3 - 5 days ago. Duration of episode(s) is 1 day. Chest pain occurs frequently. The chest pain is unchanged. The pain is associated with exertion. The severity of the pain is moderate. The quality of the pain is described as aching and pressure-like. The pain does not radiate. Chest pain is worsened by exertion. Pertinent negatives for primary symptoms include no fever, no fatigue, no shortness of breath, no cough, no wheezing, no palpitations, no abdominal pain, no nausea, no vomiting and no dizziness.  Pertinent negatives for associated symptoms include no diaphoresis and no numbness. She tried nothing for the symptoms. Risk factors include obesity and lack of exercise.  Her past medical history is significant for CAD, diabetes, hyperlipidemia and hypertension.  Pertinent negatives for past medical history include no seizures.  Her family medical history is significant for CAD in family.  Procedure history is positive for cardiac catheterization.     Past Medical History  Diagnosis Date  . Hypertension   . Hyperlipidemia   . GERD (gastroesophageal reflux disease)   . Adhesive capsulitis of shoulder     bilateral, Steroid injection Dr. Nedra Hai 1/12 bilaterally  . Peripheral neuropathy   . Adhesive capsulitis of right shoulder     Steroid injection Dr. Nedra Hai 1/12  . Adhesive capsulitis of left shoulder     Steroid injection Dr. Nedra Hai 1/12  . Obesity   . Diabetic retinopathy   . Glaucoma   . Diabetes mellitus     Type II, insulin dependent  . CAD (coronary artery  disease)     nonobstructive. Last cardiac cath (2008) showing left circumflex with mid 50% stenosis and distal luminal irregularities. Also with RCA with mid to distal 30-40% stenosis. // Previously evaluated by [facility], never followed up outpatient.    Past Surgical History  Procedure Date  . Cataract extraction   . Glaucoma surgery   . Cardiac catheterization   . Tubal ligation     Family History  Problem Relation Age of Onset  . Hypertension Sister   . Hypertension Sister   . Hypertension Sister     History  Substance Use Topics  . Smoking status: Former Smoker -- 0.3 packs/day for 7 years    Types: Cigarettes    Quit date: 06/27/2006  . Smokeless tobacco: Never Used  . Alcohol Use: No    OB History    Grav Para Term Preterm Abortions TAB SAB Ect Mult Living                  Review of Systems  Constitutional: Negative for fever, chills, diaphoresis and fatigue.  HENT: Negative for ear pain, congestion, sore throat, facial swelling, mouth sores, trouble swallowing, neck pain and neck stiffness.   Eyes: Negative.   Respiratory: Negative for apnea, cough, chest tightness, shortness of breath and wheezing.   Cardiovascular: Positive for chest pain and leg swelling. Negative for palpitations.  Gastrointestinal: Negative for nausea, vomiting, abdominal pain, diarrhea and abdominal distention.  Genitourinary: Negative for hematuria, flank pain, vaginal discharge, difficulty urinating and menstrual problem.  Musculoskeletal: Negative for back pain and gait problem.  Skin: Negative for rash and wound.  Neurological: Negative for dizziness, tremors, seizures, syncope, facial asymmetry, numbness and headaches.  Psychiatric/Behavioral: Negative.   All other systems reviewed and are negative.    Allergies  Review of patient's allergies indicates no known allergies.  Home Medications   Current Outpatient Rx  Name Route Sig Dispense Refill  . AMITRIPTYLINE HCL  25 MG PO TABS Oral Take 25 mg by mouth at bedtime.    . ASPIRIN 81 MG PO TABS Oral Take 81 mg by mouth daily.    . CYCLOBENZAPRINE HCL 10 MG PO TABS Oral Take 10 mg by mouth every 8 (eight) hours as needed. For muscle spasms    . DORZOLAMIDE HCL-TIMOLOL MAL 22.3-6.8 MG/ML OP SOLN Right Eye Place 1 drop into the right eye 2 (two) times daily.    Marland Kitchen FERROUS SULFATE 325 (65 FE) MG PO TABS Oral Take 325 mg by mouth daily with breakfast.    . FUROSEMIDE 20 MG PO TABS Oral Take 20 mg by mouth daily.    Marland Kitchen GABAPENTIN 600 MG PO TABS Oral Take 600 mg by mouth 2 (two) times daily.    . INSULIN ASPART 100 UNIT/ML [place] SOLN Subcutaneous Inject 3-6 Units into the skin 3 (three) times daily before meals. Per sliding scale    . INSULIN GLARGINE 100 UNIT/ML [place] SOLN Subcutaneous Inject 35 Units into the skin at bedtime. 10 mL 12  . LISINOPRIL 20 MG PO TABS Oral Take 20 mg by mouth daily.    Marland Kitchen METFORMIN HCL 1000 MG PO TABS Oral Take 1 tablet (1,000 mg total) by mouth 2 (two) times daily with a meal. 60 tablet 5  . METOPROLOL TARTRATE 25 MG PO TABS Oral Take 12.5 mg by mouth 2 (two) times daily.    Marland Kitchen NAPROXEN 375 MG PO TABS Oral Take 375 mg by mouth 2 (two) times daily as needed. With meals    . OMEPRAZOLE 20 MG PO CPDR Oral Take 2 capsules (40 mg total) by mouth daily. 30 capsule 11  . PRAVASTATIN SODIUM 20 MG PO TABS Oral Take 20 mg by mouth daily.    Marland Kitchen PROMETHAZINE HCL 12.5 MG PO TABS Oral Take 12.5 mg by mouth every 6 (six) hours as needed. For nausea    . TRAMADOL HCL 50 MG PO TABS Oral Take 50 mg by mouth every 6 (six) hours as needed. For pain      BP 107/70  Pulse 104  Temp 97.9 F (36.6 C) (Oral)  Resp 18  SpO2 97%  LMP 02/13/2011  Physical Exam  Nursing note and vitals reviewed. Constitutional: She is oriented to person, place, and time. She appears well-developed and well-nourished. No distress.  HENT:  Head: Normocephalic and atraumatic.  Right Ear: External ear normal.  Left Ear: External ear  normal.  Nose: Nose normal.  Mouth/Throat: Oropharynx is clear and moist. No oropharyngeal exudate.  Eyes: Conjunctivae and EOM are normal. Pupils are equal, round, and reactive to light. Right eye exhibits no discharge. Left eye exhibits no discharge.  Neck: Normal range of motion. Neck supple. No JVD present. No tracheal deviation present. No thyromegaly present.  Cardiovascular: Normal rate, regular rhythm, normal heart sounds and intact distal pulses.  Exam reveals no gallop and no friction rub.   No murmur heard. Pulmonary/Chest: Effort normal and breath sounds normal. No respiratory distress. She has no wheezes. She has no rales. She exhibits no tenderness.  Abdominal: Soft. Bowel sounds are normal. She exhibits no distension. There is no tenderness. There is no rebound and no guarding.  Musculoskeletal: Normal range of motion.  Lymphadenopathy:    She has no cervical adenopathy.  Neurological: She is alert and oriented to person, place, and time. No cranial nerve deficit. Coordination normal.  Skin: Skin is warm. No rash noted. She is not diaphoretic.  Psychiatric: She has a normal mood and affect. Her behavior is normal. Judgment and thought content normal.    ED Course  Procedures (including critical care time)  Labs Reviewed  CBC - Abnormal; Notable for the following:    WBC 13.1 (*)     Hemoglobin 11.6 (*)     HCT 35.0 (*)     All other components within normal limits  BASIC METABOLIC PANEL - Abnormal; Notable for the following:    Sodium 134 (*)     Glucose, Bld 347 (*)     BUN 27 (*)     GFR calc non Af Amer 68 (*)     GFR calc Af Amer 79 (*)     All other components within normal limits  PRO B NATRIURETIC PEPTIDE  POCT I-STAT TROPONIN I  POCT I-STAT TROPONIN I   Dg Chest 2 View  01/22/2012  *RADIOLOGY REPORT*  Clinical Data: Chest pain and shortness of breath.  CHEST - 2 VIEW  Comparison: 10/28/2011.  Findings: The cardiac silhouette, mediastinal and hilar contours  are within normal limits and stable.  Low lung volumes with vascular crowding and atelectasis.  There are chronic bronchitic changes related to smoking.  No infiltrates, edema or effusions. The bony thorax is intact.  IMPRESSION: Chronic bronchitic type lung changes related to smoking.  No acute pulmonary findings.  Original Report Authenticated By: P. Loralie Champagne, M.D.     No diagnosis found.    MDM  51 year old female patient with past medical history of coronary artery disease diagnosed with left heart catheterization done 5 years ago, hypertension, hyperlipidemia, diabetes presents with substernal left-sided chest pain. Patient says that she's been feeling short of breath with exertion for the past week has developed substernal heavy aching chest pain over the past 3 days. Chest has been intermittent but seems to have lasted all day today she says. Patient no fevers nausea vomiting abdominal pain headache neck pain. Patient with normal exam as documented above. Given her no coronary artery disease her dyspnea on exertion and her chest pain concern for possible ACS. Pain sounds nonpleuritic and not likely to be pulmonary embolism. Chest x-ray shows no pneumonia. Repeat troponins are negative so this does not appear to be an NSTEMI. Patient given nitroglycerin and aspirin in the emergency department normal vital signs. Internal medicine will admit patient for cardiology workup given her many comorbidities.  Results for orders placed during the hospital encounter of 01/22/12  CBC      Component Value Range   WBC 13.1 (*) 4.0 - 10.5 K/uL   RBC 4.04  3.87 - 5.11 MIL/uL   Hemoglobin 11.6 (*) 12.0 - 15.0 g/dL   HCT 16.1 (*) 09.6 - 04.5 %   MCV 86.6  78.0 - 100.0 fL   MCH 28.7  26.0 - 34.0 pg   MCHC 33.1  30.0 - 36.0 g/dL   RDW 40.9  81.1 - 91.4 %   Platelets 293  150 - 400 K/uL  BASIC METABOLIC PANEL      Component Value Range   Sodium 134 (*) 135 -  145 mEq/L   Potassium 4.3  3.5 - 5.1  mEq/L   Chloride 97  96 - 112 mEq/L   CO2 25  19 - 32 mEq/L   Glucose, Bld 347 (*) 70 - 99 mg/dL   BUN 27 (*) 6 - 23 mg/dL   Creatinine, Ser 9.14  0.50 - 1.10 mg/dL   Calcium 9.6  8.4 - 78.2 mg/dL   GFR calc non Af Amer 68 (*) >90 mL/min   GFR calc Af Amer 79 (*) >90 mL/min  PRO B NATRIURETIC PEPTIDE      Component Value Range   Pro B Natriuretic peptide (BNP) 13.8  0 - 125 pg/mL  POCT I-STAT TROPONIN I      Component Value Range   Troponin i, poc 0.07  0.00 - 0.08 ng/mL   Comment 3           POCT I-STAT TROPONIN I      Component Value Range   Troponin i, poc 0.00  0.00 - 0.08 ng/mL   Comment 3            DG Chest 2 View (Final result)   Result time:01/22/12 1935    Final result by Rad Results In Interface (01/22/12 19:35:15)    Narrative:   *RADIOLOGY REPORT*  Clinical Data: Chest pain and shortness of breath.  CHEST - 2 VIEW  Comparison: 10/28/2011.  Findings: The cardiac silhouette, mediastinal and hilar contours are within normal limits and stable. Low lung volumes with vascular crowding and atelectasis. There are chronic bronchitic changes related to smoking. No infiltrates, edema or effusions. The bony thorax is intact.  IMPRESSION: Chronic bronchitic type lung changes related to smoking. No acute pulmonary findings.  Original Report Authenticated By: P. Loralie Champagne, M.D.    Date: 01/22/2012  Rate: 114  Rhythm: sinus tachycardia  QRS Axis: normal  Intervals: normal  ST/T Wave abnormalities: normal  Conduction Disutrbances:none  Narrative Interpretation: LVH  Old EKG Reviewed: changes noted   Case discussed with Dr. Macario Carls, MD 01/22/12 [phone]

## 2012-01-23 DIAGNOSIS — I517 Cardiomegaly: Secondary | ICD-10-CM

## 2012-01-23 DIAGNOSIS — R079 Chest pain, unspecified: Secondary | ICD-10-CM

## 2012-01-23 LAB — COMPREHENSIVE METABOLIC PANEL
Albumin: 3.4 g/dL — ABNORMAL LOW (ref 3.5–5.2)
Alkaline Phosphatase: 67 U/L (ref 39–117)
BUN: 25 mg/dL — ABNORMAL HIGH (ref 6–23)
Calcium: 9.3 mg/dL (ref 8.4–10.5)
GFR calc Af Amer: 75 mL/min — ABNORMAL LOW (ref >90)
Potassium: 4.7 mEq/L (ref 3.5–5.1)
Total Protein: 6.7 g/dL (ref 6.0–8.3)

## 2012-01-23 LAB — GLUCOSE, CAPILLARY
Glucose-Capillary: 302 mg/dL — ABNORMAL HIGH (ref 70–99)
Glucose-Capillary: 164 mg/dL — ABNORMAL HIGH (ref 70–99)
Glucose-Capillary: 273 mg/dL — ABNORMAL HIGH (ref 70–99)
Glucose-Capillary: 256 mg/dL — ABNORMAL HIGH (ref 70–99)

## 2012-01-23 LAB — CARDIAC PANEL(CRET KIN+CKTOT+MB+TROPI)
CK, MB: 1.9 ng/mL (ref 0.3–4.0)
Total CK: 75 U/L (ref 7–177)
Troponin I: <0.3 ng/mL (ref <0.30)
Troponin I: <0.3 ng/mL (ref <0.30)

## 2012-01-23 MED ORDER — ONDANSETRON HCL 4 MG/2ML IJ SOLN: 4.0000 mg | Freq: Four times a day (QID) | INTRAMUSCULAR | Status: DC | PRN

## 2012-01-23 MED ORDER — ACETAMINOPHEN 650 MG RE SUPP: 650.0000 mg | Freq: Four times a day (QID) | RECTAL | Status: DC | PRN

## 2012-01-23 MED ORDER — ACETAMINOPHEN 325 MG PO TABS
650.0000 mg | ORAL_TABLET | Freq: Four times a day (QID) | ORAL | Status: DC | PRN
  Administered 2012-01-23 – 2012-01-24 (×3): 650 mg via ORAL
  Filled 2012-01-23 (×3): qty 2

## 2012-01-23 MED ORDER — DORZOLAMIDE HCL-TIMOLOL MAL 2-0.5 % OP SOLN
1.0000 [drp] | Freq: Two times a day (BID) | OPHTHALMIC | Status: DC
  Administered 2012-01-23 – 2012-01-25 (×6): 1 [drp] via OPHTHALMIC
  Filled 2012-01-23 (×2): qty 10

## 2012-01-23 MED ORDER — SIMVASTATIN 10 MG PO TABS
10.0000 mg | ORAL_TABLET | Freq: Every day | ORAL | Status: DC
  Administered 2012-01-23: 10 mg via ORAL
  Filled 2012-01-23 (×3): qty 1

## 2012-01-23 MED ORDER — INSULIN GLARGINE 100 UNIT/ML ~~LOC~~ SOLN
30.0000 [IU] | Freq: Every day | SUBCUTANEOUS | Status: DC
  Administered 2012-01-23 – 2012-01-24 (×2): 30 [IU] via SUBCUTANEOUS

## 2012-01-23 MED ORDER — ASPIRIN 81 MG PO CHEW
81.0000 mg | CHEWABLE_TABLET | Freq: Every day | ORAL | Status: DC
  Administered 2012-01-23 – 2012-01-25 (×3): 81 mg via ORAL
  Filled 2012-01-23 (×3): qty 1

## 2012-01-23 MED ORDER — INSULIN ASPART 100 UNIT/ML ~~LOC~~ SOLN
0.0000 [IU] | Freq: Three times a day (TID) | SUBCUTANEOUS | Status: DC
  Administered 2012-01-23: 15 [IU] via SUBCUTANEOUS
  Administered 2012-01-23: 4 [IU] via SUBCUTANEOUS
  Administered 2012-01-23: 11 [IU] via SUBCUTANEOUS
  Administered 2012-01-24: 10 [IU] via SUBCUTANEOUS
  Administered 2012-01-24: 4 [IU] via SUBCUTANEOUS
  Administered 2012-01-25: 7 [IU] via SUBCUTANEOUS
  Administered 2012-01-25: 11 [IU] via SUBCUTANEOUS

## 2012-01-23 MED ORDER — ONDANSETRON HCL 4 MG PO TABS: 4.0000 mg | ORAL_TABLET | Freq: Four times a day (QID) | ORAL | Status: DC | PRN

## 2012-01-23 MED ORDER — GABAPENTIN 600 MG PO TABS
600.0000 mg | ORAL_TABLET | Freq: Two times a day (BID) | ORAL | Status: DC | PRN
  Administered 2012-01-23: 600 mg via ORAL
  Filled 2012-01-23: qty 1

## 2012-01-23 MED ORDER — HEPARIN SODIUM (PORCINE) 5000 UNIT/ML IJ SOLN
5000.0000 [IU] | Freq: Three times a day (TID) | INTRAMUSCULAR | Status: DC
  Administered 2012-01-23 – 2012-01-25 (×6): 5000 [IU] via SUBCUTANEOUS
  Filled 2012-01-23 (×11): qty 1

## 2012-01-23 MED ORDER — FUROSEMIDE 20 MG PO TABS
20.0000 mg | ORAL_TABLET | Freq: Every day | ORAL | Status: DC
  Administered 2012-01-23 – 2012-01-25 (×2): 20 mg via ORAL
  Filled 2012-01-23 (×3): qty 1

## 2012-01-23 MED ORDER — ASPIRIN 81 MG PO TABS: 81.0000 mg | ORAL_TABLET | Freq: Every day | ORAL | Status: DC

## 2012-01-23 MED ORDER — METOPROLOL TARTRATE 25 MG PO TABS: 12.5000 mg | ORAL_TABLET | Freq: Two times a day (BID) | ORAL | Status: DC

## 2012-01-23 MED ORDER — DORZOLAMIDE HCL-TIMOLOL MAL 2-0.5 % OP SOLN
1.0000 [drp] | Freq: Two times a day (BID) | OPHTHALMIC | Status: DC
  Filled 2012-01-23: qty 10

## 2012-01-23 MED ORDER — FERROUS SULFATE 325 (65 FE) MG PO TABS
325.0000 mg | ORAL_TABLET | Freq: Every day | ORAL | Status: DC
  Administered 2012-01-23 – 2012-01-25 (×3): 325 mg via ORAL
  Filled 2012-01-23 (×4): qty 1

## 2012-01-23 MED ORDER — MORPHINE SULFATE 2 MG/ML IJ SOLN
1.0000 mg | INTRAMUSCULAR | Status: DC | PRN
  Administered 2012-01-23 (×2): 2 mg via INTRAVENOUS
  Filled 2012-01-23 (×2): qty 1

## 2012-01-23 NOTE — Progress Notes (Signed)
Subjective: Patient states she is still having chest pain on her left chest, but that it has improved. Also complains of L arm pain which is somewhat improved since admission. Still also having some shortness of breath. Denies fevers, chills. Denies nausea, vomiting, diarrhea.  Objective: Vital signs in last 24 hours: Filed Vitals:   01/23/12 1300 01/23/12 1301 01/23/12 1302 01/23/12 1305  BP: 125/68 138/82 137/74 148/78  Pulse: 84 87 92 93  Temp: 98.2 F (36.8 C)     TempSrc:      Resp: 18     Height:      Weight:      SpO2: 97%      Weight change:   Intake/Output Summary (Last 24 hours) at 01/23/12 1450 Last data filed at 01/23/12 1000  Gross per 24 hour  Intake      0 ml  Output      0 ml  Net      0 ml   Physical exam:  General: awake, alert, no acute distress CV: RRR; no murmurs; TTP of chest wall on lower left sternal border Resp: CTA bilaterally; no wheezes, rales, or rhonchi Abd: soft, non-tender, non-distended; +BS Ext: 2+ pulses; no edema; tender to palpation of calves and pretibial areas bilaterally  Lab Results: Basic Metabolic Panel:  Lab 01/23/12 1610 01/22/12 1837  NA 136 134*  K 4.7 4.3  CL 100 97  CO2 25 25  GLUCOSE 321* 347*  BUN 25* 27*  CREATININE 0.98 0.94  CALCIUM 9.3 9.6  MG -- --  PHOS -- --   Liver Function Tests:  Lab 01/23/12 0600  AST 11  ALT 10  ALKPHOS 67  BILITOT 0.1*  PROT 6.7  ALBUMIN 3.4*   CBC:  Lab 01/22/12 1837  WBC 13.1*  NEUTROABS --  HGB 11.6*  HCT 35.0*  MCV 86.6  PLT 293   Cardiac Enzymes:  Lab 01/23/12 0910 01/23/12 0203  CKTOTAL 75 88  CKMB 1.9 2.0  CKMBINDEX -- --  TROPONINI <0.30 <0.30   BNP:  Lab 01/22/12 1837  PROBNP 13.8   D-Dimer:  Lab 01/23/12 1243  DDIMER 0.37   CBG:  Lab 01/23/12 1135 01/23/12 0742  GLUCAP 164* 302*   Thyroid Function Tests:  Lab 01/23/12 0203  TSH 2.557  T4TOTAL --  FREET4 --  T3FREE --  THYROIDAB --   Urine Drug Screen: Drugs of Abuse       Component Value Date/Time   LABOPIA POSITIVE* 08/18/2008 1500   COCAINSCRNUR NONE DETECTED 08/18/2008 1500   LABBENZ NONE DETECTED 08/18/2008 1500   AMPHETMU NONE DETECTED 08/18/2008 1500   THCU NONE DETECTED 08/18/2008 1500   LABBARB  Value: NONE DETECTED        DRUG SCREEN FOR MEDICAL PURPOSES ONLY.  IF CONFIRMATION IS NEEDED FOR ANY PURPOSE, NOTIFY LAB WITHIN 5 DAYS.        LOWEST DETECTABLE LIMITS FOR URINE DRUG SCREEN Drug Class       Cutoff (ng/mL) Amphetamine      1000 Barbiturate      200 Benzodiazepine   200 Tricyclics       300 Opiates          300 Cocaine          300 THC              50 08/18/2008 1500   Studies/Results: Dg Chest 2 View  01/22/2012  *RADIOLOGY REPORT*  Clinical Data: Chest pain and shortness of breath.  CHEST - 2 VIEW  Comparison: 10/28/2011.  Findings: The cardiac silhouette, mediastinal and hilar contours are within normal limits and stable.  Low lung volumes with vascular crowding and atelectasis.  There are chronic bronchitic changes related to smoking.  No infiltrates, edema or effusions. The bony thorax is intact.  IMPRESSION: Chronic bronchitic type lung changes related to smoking.  No acute pulmonary findings.  Original Report Authenticated By: P. Loralie Champagne, M.D.   Medications: I have reviewed the patient's current medications. Scheduled Meds:   . aspirin  324 mg Oral Once  . aspirin  81 mg Oral Daily  . dorzolamide-timolol  1 drop Right Eye BID  . ferrous sulfate  325 mg Oral Q breakfast  . heparin  5,000 Units Subcutaneous Q8H  . insulin aspart  0-20 Units Subcutaneous TID WC  . insulin glargine  30 Units Subcutaneous QHS  . simvastatin  10 mg Oral q1800  . DISCONTD: aspirin  81 mg Oral Daily  . DISCONTD: dorzolamide-timolol  1 drop Right Eye BID   Continuous Infusions:  PRN Meds:.acetaminophen, acetaminophen, gabapentin, morphine injection, nitroGLYCERIN, ondansetron (ZOFRAN) IV, ondansetron Assessment/Plan: 1. Chest pain - CE neg x 2, trop neg x  4. D-dimer wnl. Cards consulted, and recommended echo. Initially cards had recommended for CTA chest due to concern for pulm embolism, but D-dimer was negative, so they deferred this study. Echo in process. - appreciate cardiology input regarding any further recommended workup for chest pain in inpatient setting  2. Hypertension - Blood pressure stable. Will resume home lasix as patient is complaining of subjective swelling in both feet.  3. DM II - Continue lantus at lowered dose of 30 units nightly and resistant SSI. Blood sugars in 300s shortly after presentation, but fasting glucose ~160 this AM. Will not change current regimen at this time. - stress importance of compliance with insulin/oral anti-hyperglycemic regimen  4. Hyperlipidemia - Continue statin.   5. CAD - See above, last cath in 2008 with non-obstructive disease. Continue ASA daily. Begin resuming anti-HTNs.   6. DVT ppx - Heparin  7. Dispo - will discuss with cardiology their level of comfort in having the patient follow-up on echocardiogram results as outpatient. Stable medically. Discharge likely within 24 hours.   LOS: 1 day   [name] R 01/23/2012, 2:50 PM

## 2012-01-23 NOTE — Progress Notes (Signed)
Patient ID: ALEIGHNA WOJTAS, female   DOB: [birth year], 51 y.o.   MRN: [id number]   CARDIOLOGY CONSULT NOTE  Patient ID: [name] MRN: [id number] DOB/AGE: 1960-11-21 51 y.o.  Admit date: 01/22/2012 Referring Physician Blanch Media  Primary Physician:  Primary Cardiologist: Reason for Consultation: Chest pain   HPI: [name] 51 yo AA woman pmh HTN, HLD, DM type 2, GERD p/w worsening CP.  She describes the pain as a crushing pressure/ squeezing characteristic starting in her center chest and then radiating to her L arm. She reports that she started experiencing symptoms when she was getting out of bed however, she had developed shortness of breath the night before which she ignored. She states that pain had been intermittent throughout the whole day and reported to the ED 12hrs later. The chest pain was associated with palpitations and sweating. The pain was exacerbated by movement and semi relieved with rest. She was nauseous and had one episode of vomiting that was relieved with some home meds. She has had several complaints of tachycardic sensations with no palpitations that are worse w/exercise and that can sometimes increase her SOB and produce some leg swelling. That was addressed at her last clinic visit and she was started on Lasix that she states has helped. Her legs are not swollen now at all.   She report history of PND about 3 times over the last one month. She props up herself in with 5 pillows in bed. She is only able to walk less than one block on flat surface before she becomes short of breath. She reports that she unable to go climb the steps in her house due to SOB. She reports no history of syncope but she 'comes close to it'  that when stands up.   The pain has resolved since yesterday after admission.  Past Medical History  Diagnosis Date  . Hypertension   . Hyperlipidemia   . GERD (gastroesophageal reflux disease)   . Adhesive capsulitis of shoulder    bilateral, Steroid injection Dr. Nedra Hai 1/12 bilaterally  . Peripheral neuropathy   . Adhesive capsulitis of right shoulder     Steroid injection Dr. Nedra Hai 1/12  . Adhesive capsulitis of left shoulder     Steroid injection Dr. Nedra Hai 1/12  . Obesity   . Diabetic retinopathy   . Glaucoma   . Diabetes mellitus     Type II, insulin dependent  . CAD (coronary artery disease)     nonobstructive. Last cardiac cath (2008) showing left circumflex with mid 50% stenosis and distal luminal irregularities. Also with RCA with mid to distal 30-40% stenosis. // Previously evaluated by [facility], never followed up outpatient.     Past Surgical History  Procedure Date  . Cataract extraction   . Glaucoma surgery   . Cardiac catheterization   . Tubal ligation      Family History  Problem Relation Age of Onset  . Hypertension Sister   . Hypertension Sister   . Hypertension Sister   Her mother died from heart attack but she also had some blood clot problems.     Social History: History   Social History  . Marital Status: Single    Spouse Name: N/A    Number of Children: 2  . Years of Education: 12th grade   Occupational History  . 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    Social History Main  Topics  . Smoking status: Former Smoker -- 0.3 packs/day for 7 years    Types: Cigarettes    Quit date: 06/27/2006  . Smokeless tobacco: Never Used  . Alcohol Use: No  . Drug Use: No  . Sexually Active: Not Currently   Other Topics Concern  . Not on file   Social History Narrative   Lives with her kids, 28yo daughter and 31 yo adopted son.     Prescriptions prior to admission  Medication Sig Dispense Refill  . amitriptyline (ELAVIL) 25 MG tablet Take 25 mg by mouth at bedtime.      Marland Kitchen aspirin 81 MG tablet Take 81 mg by mouth daily.      . cyclobenzaprine (FLEXERIL) 10 MG tablet Take 10 mg by mouth every 8 (eight) hours as  needed. For muscle spasms      . dorzolamide-timolol (COSOPT) 22.3-6.8 MG/ML ophthalmic solution Place 1 drop into the right eye 2 (two) times daily.      . ferrous sulfate 325 (65 FE) MG tablet Take 325 mg by mouth daily with breakfast.      . furosemide (LASIX) 20 MG tablet Take 20 mg by mouth daily.      Marland Kitchen gabapentin (NEURONTIN) 600 MG tablet Take 600 mg by mouth 2 (two) times daily.      . insulin aspart (NOVOLOG) 100 UNIT/ML injection Inject 3-6 Units into the skin 3 (three) times daily before meals. Per sliding scale      . insulin glargine (LANTUS) 100 UNIT/ML injection Inject 35 Units into the skin at bedtime.  10 mL  12  . lisinopril (PRINIVIL,ZESTRIL) 20 MG tablet Take 20 mg by mouth daily.      . metFORMIN (GLUCOPHAGE) 1000 MG tablet Take 1 tablet (1,000 mg total) by mouth 2 (two) times daily with a meal.  60 tablet  5  . metoprolol tartrate (LOPRESSOR) 25 MG tablet Take 12.5 mg by mouth 2 (two) times daily.      . naproxen (NAPROSYN) 375 MG tablet Take 375 mg by mouth 2 (two) times daily as needed. With meals      . omeprazole (PRILOSEC) 20 MG capsule Take 2 capsules (40 mg total) by mouth daily.  30 capsule  11  . pravastatin (PRAVACHOL) 20 MG tablet Take 20 mg by mouth daily.      . promethazine (PHENERGAN) 12.5 MG tablet Take 12.5 mg by mouth every 6 (six) hours as needed. For nausea      . traMADol (ULTRAM) 50 MG tablet Take 50 mg by mouth every 6 (six) hours as needed. For pain        ROS: General: no fevers/chills/night sweats Eyes: no blurry vision, diplopia, or amaurosis ENT: no sore throat or hearing loss Resp: no cough, wheezing, or hemoptysis GI: no abdominal pain, nausea, vomiting, diarrhea, or constipation GU: no dysuria, frequency, or hematuria Skin: no rash Neuro: no headache, numbness, tingling, or weakness of extremities Musculoskeletal: no joint pain or swelling Heme: no bleeding, DVT, or easy bruising Endo: no polydipsia or polyuria    Physical  Exam: Blood pressure 142/68, pulse 100, temperature 98.1 F (36.7 C), temperature source Oral, resp. rate 18, height 5\' 6"  (1.676 m), weight 257 lb 12.8 oz (116.937 kg), last menstrual period 02/13/2011, SpO2 98.00%.  Pt is alert and oriented, WD, WN, in no distress. HEENT: normal Neck: JVP normal. Carotid upstrokes normal without bruits. No thyromegaly. Lungs: equal expansion, clear bilaterally CV: Apex is discrete and nondisplaced, RRR without  murmur or gallop. That sounds a distended due to body habitus. Abd: soft, NT, +BS, no bruit, no hepatosplenomegaly Back: no CVA tenderness Ext: no C/C/E        Femoral pulses 2+= without bruits        DP/PT pulses intact and = Skin: warm and dry without rash Neuro: CNII-XII intact             Strength intact = bilaterally  Labs:   Lab Results  Component Value Date   WBC 13.1* 01/22/2012   HGB 11.6* 01/22/2012   HCT 35.0* 01/22/2012   MCV 86.6 01/22/2012   PLT 293 01/22/2012    Lab 01/23/12 0600  NA 136  K 4.7  CL 100  CO2 25  BUN 25*  CREATININE 0.98  CALCIUM 9.3  PROT 6.7  BILITOT 0.1*  ALKPHOS 67  ALT 10  AST 11  GLUCOSE 321*   Lab Results  Component Value Date   CKTOTAL 88 01/23/2012   CKMB 2.0 01/23/2012   TROPONINI <0.30 01/23/2012    Lab Results  Component Value Date   CHOL 148 10/29/2011   CHOL 166 04/15/2011   CHOL 157 09/10/2010   Lab Results  Component Value Date   HDL 54 10/29/2011   HDL 63 04/15/2011   HDL 54 09/10/2010   Lab Results  Component Value Date   LDLCALC 74 10/29/2011   LDLCALC 83 04/15/2011   LDLCALC 61 09/10/2010   Lab Results  Component Value Date   TRIG 102 10/29/2011   TRIG 99 04/15/2011   TRIG 209* 09/10/2010   Lab Results  Component Value Date   CHOLHDL 2.7 10/29/2011   CHOLHDL 2.6 04/15/2011   CHOLHDL 2.9 09/10/2010   No results found for this basename: LDLDIRECT      Echo: 09/18/2006  SUMMARY - Overall left ventricular systolic function was normal. Left ventricular ejection fraction  was estimated to be 60 % , range being 55 % to 65 %. This study was inadequate for the evaluation of left ventricular regional wall motion.  Cardiac Cath: 09/20/2006  FINDINGS:  1. Left Main: Normal.  2. LAD: Large vessel with mild luminal irregularities.  3. D1: Moderate-sized vessel with mild luminal irregularities.  4. LCX: Codominant, large vessel with proximal luminal  irregularities, large OM1 with mild luminal irregularities.  Continuation of LCX/OM2 vessel shows also moderate-to-large vessel  with mid 50% stenosis and distal luminal irregularities.  5. RCA: Codominant with proximal to mid luminal irregularities and  mid to distal 30-40% stenosis.  6. LV: EF is 55-60%. No wall motion abnormalities. LVEDP is 9 mmHg.  7. Limited right femoral angiogram shows normal appearing femoral  artery with no significant obstructive disease. No sheath  dissection noted. Arteriotomy site is in good position.  IMPRESSION:  1. Moderate nonobstructive LCX/RCA disease (mid LCX/OM2 with 50% mid  stenosis and mid to distal RCA with 30-40% stenosis).  2. Normal LV systolic function with an EF of 55%.  3. Normal-appearing femoral artery.   ECG: 22-Jan-2012 18:35:07  Sinus tachycardia  Minimal voltage criteria for LVH, may be normal variant  Nonspecific T wave abnormality  Vent. rate 114 BPM  PR interval 146 ms  QRS duration 84 ms  QT/QTc 322/443 ms  P-R-T axes 56 31 74   Radiology: Dg Chest 2 View  01/22/2012 *RADIOLOGY REPORT* Clinical Data: Chest pain and shortness of breath. CHEST - 2 VIEW Comparison: 10/28/2011. Findings: The cardiac silhouette, mediastinal and hilar contours are within normal limits and  stable. Low lung volumes with vascular crowding and atelectasis. There are chronic bronchitic changes related to smoking. No infiltrates, edema or effusions. The bony thorax is intact. IMPRESSION: Chronic bronchitic type lung changes related to smoking. No acute pulmonary findings. Original  Report Authenticated By: P. Loralie Champagne, M.D.   ASSESSMENT AND PLAN:  1. Chest pain: The patient has multiple predisposing factors to coronary artery disease including hypertension, hyperlipidemia and diabetes with a history of tobacco use. She describes what sounds to be a typical chest pain lasting about 12 hours of CCS class III. The cardiac enzymes are negative x2. EKG is only significant for sinus tachycardia. No ST segment changes. The chest x-ray does not indicate pulmonary congestion due to cardiac cause and proBNP has been within normal ranges. I have reviewed to the findings from left cardiac cath which was done in 2008. It  indicated moderate nonobstructive left circumflex and right coronary artery disease. An echocardiogram around the same time, showed an overall normal left ventricular function with an EF of 60%. However the study was inadequate for evaluation of  regional left  ventricular wall motion. The patient is receiving proper treatment for coronary artery disease with a lisinopril, pravastatin, metoprolol, and cardiac aspirin. I will discuss with Dr. Excell Seltzer regarding the benefits of the stress test versus coronary angiogram. Given the patient's shortness of breath with minimal activity, exercise stress test may not be possible.   2. Shortness of Breath The patient describes worsening shortness of breath over the last one month. [place] class II-III HF cannot be excluded in this patient. She will most likely require chronic diuretics since to relieve symptoms and leg swelling. She has responded well to a small dose of oral lasix of 20mg  daily. Will maintain her on that. We will also do a repeat ECHO to assess to determine her current EF.  She also describes episodes of feeling oozy and close to passing out when she gets up from a lying position. Lets do orthostatic vitals.    Dow Adolph 01/23/2012, 10:02 AM  Patient seen, examined. Available data reviewed. Agree with findings,  assessment, and plan as outlined by Dr Zada Girt. I have independently interviewed and examined the patient. I have also reviewed all available lab and imaging data as well as past records. Briefly, this 51 year-old woman with multiple CV risk factors comes in with shortness of breath, chest pain, and sinus tachycardia. Serial cardiac markers are unrevealing and EKG shows no acute changes. However, d-dimer is significantly elevated. Her physical exam is unrevealing and I agree with the exam as documented above. Lung fields are clear and heart is RRR without murmur or gallop. There is no peripheral edema. EKG shows no acute ST-T changes, but does show sinus tachycardia with increased voltage. Differential dx includes pulmonary embolus, ACS, and heart failure although I do not appreciate evidence of CHF on exam. I am most concerned about PE in this patient with a family history of DVT/PE, tachycardia, shortness of breath, and elevated d-dimer. Would pursue CT chest PE protocol and will discuss with the primary team. Also will update her echo to evaluate for LV dysfunction or other cardiac cause of symptoms. I would not pursue stress testing until we rule out pulmonary embolus. Will follow with you, thx.  Tonny Bollman, M.D. 01/23/2012 12:13 PM

## 2012-01-23 NOTE — ED Provider Notes (Signed)
I have personally seen and examined the patient.  I have discussed the plan of care with the resident.  I have reviewed the documentation on PMH/FH/Soc. History.  I have reviewed the documentation of the resident and agree.  I have reviewed and agree with the ECG interpretation(s) documented by the resident.  Advised admission as patient gave concerning CP story.   Joya Gaskins, MD 01/23/12 [phone]

## 2012-01-23 NOTE — Progress Notes (Signed)
Utilization review complete 
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
## 2012-01-23 NOTE — ED Notes (Signed)
Pt denies CP, verified by [facility] RN. Pt states she has a HA. Pt given tylenol for HA.

## 2012-01-24 ENCOUNTER — Encounter (HOSPITAL_COMMUNITY)
Admission: EM | Disposition: A | Discharge status: Home / Self Care | Payer: Self-pay | Source: Home / Self Care | Attending: Internal Medicine

## 2012-01-24 DIAGNOSIS — I2510 Atherosclerotic heart disease of native coronary artery without angina pectoris: Secondary | ICD-10-CM

## 2012-01-24 DIAGNOSIS — R079 Chest pain, unspecified: Secondary | ICD-10-CM

## 2012-01-24 HISTORY — PX: LEFT HEART CATHETERIZATION WITH CORONARY ANGIOGRAM: SHX5451

## 2012-01-24 LAB — GLUCOSE, CAPILLARY
Glucose-Capillary: 389 mg/dL — ABNORMAL HIGH (ref 70–99)
Glucose-Capillary: 165 mg/dL — ABNORMAL HIGH (ref 70–99)
Glucose-Capillary: 383 mg/dL — ABNORMAL HIGH (ref 70–99)

## 2012-01-24 LAB — BASIC METABOLIC PANEL
BUN: 23 mg/dL (ref 6–23)
Chloride: 97 mEq/L (ref 96–112)
Creatinine, Ser: 0.94 mg/dL (ref 0.50–1.10)
GFR calc Af Amer: 79 mL/min — ABNORMAL LOW (ref >90)
GFR calc non Af Amer: 68 mL/min — ABNORMAL LOW (ref >90)
Glucose, Bld: 358 mg/dL — ABNORMAL HIGH (ref 70–99)

## 2012-01-24 SURGERY — LEFT HEART CATHETERIZATION WITH CORONARY ANGIOGRAM
Anesthesia: LOCAL

## 2012-01-24 MED ORDER — HEPARIN (PORCINE) IN NACL 2-0.9 UNIT/ML-% IJ SOLN
INTRAMUSCULAR | Status: AC
  Filled 2012-01-24: qty 2000

## 2012-01-24 MED ORDER — METOPROLOL TARTRATE 12.5 MG HALF TABLET
12.5000 mg | ORAL_TABLET | Freq: Two times a day (BID) | ORAL | Status: DC
  Administered 2012-01-24 – 2012-01-25 (×2): 12.5 mg via ORAL
  Filled 2012-01-24 (×3): qty 1

## 2012-01-24 MED ORDER — LIDOCAINE HCL (PF) 1 % IJ SOLN
INTRAMUSCULAR | Status: AC
  Filled 2012-01-24: qty 30

## 2012-01-24 MED ORDER — NITROGLYCERIN 0.2 MG/ML ON CALL CATH LAB
INTRAVENOUS | Status: AC
  Filled 2012-01-24: qty 1

## 2012-01-24 MED ORDER — SODIUM CHLORIDE 0.9 % IV SOLN
INTRAVENOUS | Status: AC
  Administered 2012-01-24: 18:00:00 via INTRAVENOUS

## 2012-01-24 MED ORDER — INSULIN ASPART 100 UNIT/ML ~~LOC~~ SOLN
10.0000 [IU] | Freq: Once | SUBCUTANEOUS | Status: AC
  Administered 2012-01-24: 10 [IU] via SUBCUTANEOUS

## 2012-01-24 MED ORDER — MIDAZOLAM HCL 2 MG/2ML IJ SOLN
INTRAMUSCULAR | Status: AC
  Filled 2012-01-24: qty 2

## 2012-01-24 MED ORDER — ONDANSETRON HCL 4 MG/2ML IJ SOLN: 4.0000 mg | Freq: Four times a day (QID) | INTRAMUSCULAR | Status: DC | PRN

## 2012-01-24 MED ORDER — FENTANYL CITRATE 0.05 MG/ML IJ SOLN
INTRAMUSCULAR | Status: AC
  Filled 2012-01-24: qty 2

## 2012-01-24 MED ORDER — SODIUM CHLORIDE 0.9 % IV SOLN: INTRAVENOUS | Status: DC

## 2012-01-24 MED ORDER — INSULIN ASPART 100 UNIT/ML ~~LOC~~ SOLN
25.0000 [IU] | Freq: Once | SUBCUTANEOUS | Status: AC
  Administered 2012-01-24: 25 [IU] via SUBCUTANEOUS

## 2012-01-24 MED ORDER — ACETAMINOPHEN 325 MG PO TABS: 650.0000 mg | ORAL_TABLET | ORAL | Status: DC | PRN

## 2012-01-24 NOTE — Progress Notes (Signed)
Inpatient Diabetes Program Recommendations  AACE/ADA: New Consensus Statement on Inpatient Glycemic Control (2009)  Target Ranges:  Prepandial:   less than 140 mg/dL      Peak postprandial:   less than 180 mg/dL (1-2 hours)      Critically ill patients:  140 - 180 mg/dL    Results for [name] (MRN [id number]) as of 01/24/2012 14:57  Ref. Range 01/23/2012 07:42 01/23/2012 11:35 01/23/2012 17:07 01/23/2012 20:25  Glucose-Capillary Latest Range: 70-99 mg/dL 409 (H) 811 (H) 914 (H) 256 (H)    Results for [facility], [facility] (MRN [id number]) as of 01/24/2012 14:57  Ref. Range 01/24/2012 07:33 01/24/2012 11:34  Glucose-Capillary Latest Range: 70-99 mg/dL 086 (H) 578 (H)     Inpatient Diabetes Program Recommendations Insulin - Basal: Please increase Lantus to 40 units QHS. Insulin - Meal Coverage: Please add home Novolog meal coverage once patient back on PO diet- Novolog 3 units with breakfast/ 2 units with lunch/ 5 units with supper.  Note: Will follow. Ambrose Finland RN, MSN, CDE Diabetes Coordinator Inpatient Diabetes Program [phone]

## 2012-01-24 NOTE — CV Procedure (Signed)
    Cardiac Cath Note  [name] [id number] [birth year]  Procedure: Left  Heart Cardiac Catheterization Note Indications: Chest pain/ pressure  Procedure Details Consent: Obtained Time Out: Verified patient identification, verified procedure, site/side was marked, verified correct patient position, special equipment/implants available, Radiology Safety Procedures followed,  medications/allergies/relevent history reviewed, required imaging and test results available.  Performed   Medications: Fentanyl: 50 mcg IV  Versed: 2 mg IV  The right femoral artery was easily canulated using a modified Seldinger technique.  Hemodynamics:   LV pressure: 163/18 Aortic pressure: 158/85  Angiography   Left Main:  Smooth and normal  Left anterior Descending: Proximal vessel is normal.  There is a mid stenosis of 50% that appears to be within a myocardial bridge. The distal LAD is fairly normal.  The 1st diagonal is normal  Left Circumflex:  The left circumflex is a large vessel.  It gives off a large 1st OM which is normal.  The distal LCx is normal  Right Coronary Artery: moderate in size and is normal.  The PDA is small but is normal  LV Gram: moderate global hypokinesia with EF of 40-45%.   Complications: No apparent complications Patient did tolerate procedure well.  Conclusions:   1. Minor coronary irregularities.  There is a 50% stenosis in the mid LAD that may be associated with myocardial bridging.  It does not appear to obstruct flow. 2. Moderate LV dysfunction with EF of 40-45%  Continue with aggressive medical therapy for HTN.  Vesta Mixer, Montez Hageman., MD, [facility] 01/24/2012, 3:34 PM Office - [phone] Pager [phone]

## 2012-01-24 NOTE — Interval H&P Note (Signed)
History and Physical Interval Note:  01/24/2012 3:33 PM  [name]  has presented today for surgery, with the diagnosis of chest pain  The various methods of treatment have been discussed with the patient and family. After consideration of risks, benefits and other options for treatment, the patient has consented to  Procedure(s) (LRB): LEFT HEART CATHETERIZATION WITH CORONARY ANGIOGRAM (N/A) as a surgical intervention .  The patient's history has been reviewed, patient examined, no change in status, stable for surgery.  I have reviewed the patient's chart and labs.  Questions were answered to the patient's satisfaction.     Elyn Aquas.

## 2012-01-24 NOTE — Plan of Care (Signed)
Problem: Consults Goal: Cardiac Cath Patient Education (See Patient Education module for education specifics.) Outcome: Completed/Met Date Met:  01/24/12 Cath video viewed

## 2012-01-24 NOTE — Progress Notes (Signed)
Pt ambulated approximately 300 ft in hallway following bedrest.  Pt tolerated well.  Vascular site remains stable at a level 1.  Pt complaining of a "sore" type pain at site.  Medication administered.  Will continue to monitor.

## 2012-01-24 NOTE — Progress Notes (Signed)
Continued Stay- Utilization review complete.

## 2012-01-24 NOTE — H&P (View-Only) (Signed)
   Patient: [name] Date of Encounter: 01/24/2012, 8:45 AM Admit date: 01/22/2012     Subjective  [name] reports DOE but feels it is some better this AM. She also has had exertional chest pressure, LE swelling and orthopnea. She denies palpitations, dizziness, near syncope or syncope.   Objective  Physical Exam: Vitals: BP 102/67  Pulse 91  Temp 98.3 F (36.8 C) (Oral)  Resp 18  Ht 5' 6" (1.676 m)  Wt 255 lb (115.667 kg)  BMI 41.16 kg/m2  SpO2 98%  LMP 02/13/2011 General: Well developed, well appearing 51 year old female in no acute distress. Neck: Supple. JVD not elevated. Lungs: Clear bilaterally to auscultation without wheezes, rales, or rhonchi. Breathing is unlabored. Heart: RRR S1 S2 without murmur, rub or gallop.  Abdomen: Soft, non-distended. Extremities: No clubbing or cyanosis. No edema.  Distal pedal pulses are 2+ and equal bilaterally. Neuro: Alert and oriented X 3. Moves all extremities spontaneously. No focal deficits.  Intake/Output: Intake/Output Summary (Last 24 hours) at 01/24/12 0845 Last data filed at 01/23/12 1000  Gross per 24 hour  Intake      0 ml  Output      0 ml  Net      0 ml    Inpatient Medications:  . aspirin  81 mg Oral Daily  . dorzolamide-timolol  1 drop Right Eye BID  . ferrous sulfate  325 mg Oral Q breakfast  . furosemide  20 mg Oral Daily  . heparin  5,000 Units Subcutaneous Q8H  . insulin aspart  0-20 Units Subcutaneous TID WC  . insulin aspart  10 Units Subcutaneous Once  . insulin glargine  30 Units Subcutaneous QHS  . simvastatin  10 mg Oral q1800   Labs:  Basename 01/23/12 0600 01/22/12 1837  NA 136 134*  K 4.7 4.3  CL 100 97  CO2 25 25  GLUCOSE 321* 347*  BUN 25* 27*  CREATININE 0.98 0.94  CALCIUM 9.3 9.6  MG -- --  PHOS -- --    Basename 01/23/12 0600  AST 11  ALT 10  ALKPHOS 67  BILITOT 0.1*  PROT 6.7  ALBUMIN 3.4*    Basename 01/22/12 1837  WBC 13.1*  NEUTROABS --  HGB 11.6*  HCT  35.0*  MCV 86.6  PLT 293    Basename 01/23/12 0910 01/23/12 0203  CKTOTAL 75 88  CKMB 1.9 2.0  TROPONINI <0.30 <0.30    Basename 01/23/12 1243  DDIMER 0.37    Basename 01/23/12 0203  TSH 2.557  T4TOTAL --  T3FREE --  THYROIDAB --    Radiology/Studies: Dg Chest 2 View 01/22/2012   IMPRESSION: Chronic bronchitic type lung changes related to smoking.  No acute pulmonary findings.   Original Report Authenticated By: P. MARK GALLERANI, M.D.   Echocardiogram: - Extremely limited due to poor sound wave transmission; LV function appears to be moderately reduced; LVEF 35-40%; focal wall motion abnormality cannot be excluded; suggest TEE or MUGA if clinically indicated. - Left ventricle: The cavity size was normal. Wall thickness was increased in a pattern of mild LVH. Systolic function was moderately reduced. The estimated ejection fraction was in the range of 35% to 40%. Diffuse hypokinesis. Regional wall motion abnormalities cannot be excluded. - Aortic valve: Trileaflet; normal thickness leaflets. Mobility was not restricted. Doppler: Transvalvular velocity was within the normal range. There was no stenosis. No regurgitation. - Aorta: Aortic root: The aortic root was normal in size. - Mitral valve:   Structurally normal valve. Mobility was not restricted. Doppler: Transvalvular velocity was within the normal range. There was no evidence for stenosis. No regurgitation. - Left atrium: The atrium was normal in size. - Right ventricle: Poorly visualized. The cavity size was normal. Systolic function was normal. - Pulmonic valve: Doppler: Transvalvular velocity was within the normal range. There was no evidence for stenosis. - Tricuspid valve: Poorly visualized. Doppler: Transvalvular velocity was within the normal range. No regurgitation. - Right atrium: Poorly visualized. The atrium was normal in size. - Pericardium: There was no pericardial effusion. - Systemic veins: Inferior vena cava:  The vessel was normal in size.  Telemetry: normal sinus rhythm, intermittent sinus tachycardia with occasional PVCs   Assessment and Plan  1. New LV dysfunction with ? focal WMAs - CEs negative but in patient with multiple risk factors and recurrent CP, would recommend definitive evaluation of her coronary anatomy with a cardiac catheterization; the procedure was discussed in detail, including risks and benefits; these risks include but are not limited to death, MI, stroke, bleeding, blood clots, damage to the blood vessels or damage to the kidneys; possible treatment outcomes should obstructive CAD be found were reviewed including angioplasty, stent placement or bypass surgery; [name] expressed verbal understanding and agrees to proceed with a cardiac catheterization (previous cath in 2008 - moderate nonobstructive LCX/RCA disease (mid LCX/OM2 with 50% mid stenosis and mid to distal RCA with 30-40% stenosis) 2. Acute systolic CHF - continue PO lasix; initiate appropriate medical therapy - since BP low normal will start low dose carvedilol and follow BP; if BP stable, will add ACEI; patient education regarding CHF, low sodium diet; follow I/Os and daily weights 3. Tobacco abuse - patient was counseled regarding smoking cessation   Dr. Camya Haydon to see and make further recommendations Signed, EDMISTEN, BROOKE PA-C  Patient seen, examined. Available data reviewed. Agree with findings, assessment, and plan as outlined by Brooke Edmisten, PA-C. The patient was independently interviewed and examined. I have reviewed all available lab and imaging data. She has continued to have shortness of breath and chest pain with exertion. Her echocardiogram shows new LV dysfunction. Her left ventricular ejection fraction is estimated at 35%. I agree that it is appropriate to proceed with cardiac catheterization and possible PCI. Obstructive coronary artery disease needs to be ruled out as a cause of her LV dysfunction.  She had moderate obstructive disease on cardiac cath about 5 years ago. I have reviewed the risks, indications, and alternatives to cardiac cath and possible PCI with the patient. These risks include but are not limited to vascular injury, bleeding, stroke, myocardial infarction, and death. She understands and agrees to proceed.  Sangita Zani, M.D. 01/24/2012 10:29 AM   

## 2012-01-24 NOTE — Progress Notes (Signed)
Patient: [name] Date of Encounter: 01/24/2012, 8:45 AM Admit date: 01/22/2012     Subjective  [name] reports DOE but feels it is some better this AM. She also has had exertional chest pressure, LE swelling and orthopnea. She denies palpitations, dizziness, near syncope or syncope.   Objective  Physical Exam: Vitals: BP 102/67  Pulse 91  Temp 98.3 F (36.8 C) (Oral)  Resp 18  Ht 5\' 6"  (1.676 m)  Wt 255 lb (115.667 kg)  BMI 41.16 kg/m2  SpO2 98%  LMP 02/13/2011 General: Well developed, well appearing 51 year old female in no acute distress. Neck: Supple. JVD not elevated. Lungs: Clear bilaterally to auscultation without wheezes, rales, or rhonchi. Breathing is unlabored. Heart: RRR S1 S2 without murmur, rub or gallop.  Abdomen: Soft, non-distended. Extremities: No clubbing or cyanosis. No edema.  Distal pedal pulses are 2+ and equal bilaterally. Neuro: Alert and oriented X 3. Moves all extremities spontaneously. No focal deficits.  Intake/Output: Intake/Output Summary (Last 24 hours) at 01/24/12 0845 Last data filed at 01/23/12 1000  Gross per 24 hour  Intake      0 ml  Output      0 ml  Net      0 ml    Inpatient Medications:  . aspirin  81 mg Oral Daily  . dorzolamide-timolol  1 drop Right Eye BID  . ferrous sulfate  325 mg Oral Q breakfast  . furosemide  20 mg Oral Daily  . heparin  5,000 Units Subcutaneous Q8H  . insulin aspart  0-20 Units Subcutaneous TID WC  . insulin aspart  10 Units Subcutaneous Once  . insulin glargine  30 Units Subcutaneous QHS  . simvastatin  10 mg Oral q1800   Labs:  [facility] 01/23/12 0600 01/22/12 1837  NA 136 134*  K 4.7 4.3  CL 100 97  CO2 25 25  GLUCOSE 321* 347*  BUN 25* 27*  CREATININE 0.98 0.94  CALCIUM 9.3 9.6  MG -- --  PHOS -- --    Basename 01/23/12 0600  AST 11  ALT 10  ALKPHOS 67  BILITOT 0.1*  PROT 6.7  ALBUMIN 3.4*    Basename 01/22/12 1837  WBC 13.1*  NEUTROABS --  HGB 11.6*  HCT  35.0*  MCV 86.6  PLT 293    Basename 01/23/12 0910 01/23/12 0203  CKTOTAL 75 88  CKMB 1.9 2.0  TROPONINI <0.30 <0.30    Basename 01/23/12 1243  DDIMER 0.37    Basename 01/23/12 0203  TSH 2.557  T4TOTAL --  T3FREE --  THYROIDAB --    Radiology/Studies: Dg Chest 2 View 01/22/2012   IMPRESSION: Chronic bronchitic type lung changes related to smoking.  No acute pulmonary findings.   Original Report Authenticated By: P. Loralie Champagne, M.D.   Echocardiogram: - Extremely limited due to poor sound wave transmission; LV function appears to be moderately reduced; LVEF 35-40%; focal wall motion abnormality cannot be excluded; suggest TEE or MUGA if clinically indicated. - Left ventricle: The cavity size was normal. Wall thickness was increased in a pattern of mild LVH. Systolic function was moderately reduced. The estimated ejection fraction was in the range of 35% to 40%. Diffuse hypokinesis. Regional wall motion abnormalities cannot be excluded. - Aortic valve: Trileaflet; normal thickness leaflets. Mobility was not restricted. Doppler: Transvalvular velocity was within the normal range. There was no stenosis. No regurgitation. - Aorta: Aortic root: The aortic root was normal in size. - Mitral valve:  Structurally normal valve. Mobility was not restricted. Doppler: Transvalvular velocity was within the normal range. There was no evidence for stenosis. No regurgitation. - Left atrium: The atrium was normal in size. - Right ventricle: Poorly visualized. The cavity size was normal. Systolic function was normal. - Pulmonic valve: Doppler: Transvalvular velocity was within the normal range. There was no evidence for stenosis. - Tricuspid valve: Poorly visualized. Doppler: Transvalvular velocity was within the normal range. No regurgitation. - Right atrium: Poorly visualized. The atrium was normal in size. - Pericardium: There was no pericardial effusion. - Systemic veins: Inferior vena cava:  The vessel was normal in size.  Telemetry: normal sinus rhythm, intermittent sinus tachycardia with occasional PVCs   Assessment and Plan  1. New LV dysfunction with ? focal WMAs - CEs negative but in patient with multiple risk factors and recurrent CP, would recommend definitive evaluation of her coronary anatomy with a cardiac catheterization; the procedure was discussed in detail, including risks and benefits; these risks include but are not limited to death, MI, stroke, bleeding, blood clots, damage to the blood vessels or damage to the kidneys; possible treatment outcomes should obstructive CAD be found were reviewed including angioplasty, stent placement or bypass surgery; [name] expressed verbal understanding and agrees to proceed with a cardiac catheterization (previous cath in 2008 - moderate nonobstructive LCX/RCA disease (mid LCX/OM2 with 50% mid stenosis and mid to distal RCA with 30-40% stenosis) 2. Acute systolic CHF - continue PO lasix; initiate appropriate medical therapy - since BP low normal will start low dose carvedilol and follow BP; if BP stable, will add ACEI; patient education regarding CHF, low sodium diet; follow I/Os and daily weights 3. Tobacco abuse - patient was counseled regarding smoking cessation   Dr. Excell Seltzer to see and make further recommendations Signed, EDMISTEN, BROOKE PA-C  Patient seen, examined. Available data reviewed. Agree with findings, assessment, and plan as outlined by Rick Duff, PA-C. The patient was independently interviewed and examined. I have reviewed all available lab and imaging data. She has continued to have shortness of breath and chest pain with exertion. Her echocardiogram shows new LV dysfunction. Her left ventricular ejection fraction is estimated at 35%. I agree that it is appropriate to proceed with cardiac catheterization and possible PCI. Obstructive coronary artery disease needs to be ruled out as a cause of her LV dysfunction.  She had moderate obstructive disease on cardiac cath about 5 years ago. I have reviewed the risks, indications, and alternatives to cardiac cath and possible PCI with the patient. These risks include but are not limited to vascular injury, bleeding, stroke, myocardial infarction, and death. She understands and agrees to proceed.  Tonny Bollman, M.D. 01/24/2012 10:29 AM

## 2012-01-24 NOTE — Progress Notes (Signed)
Internal Medicine Teaching Service Attending Note Date: 01/24/2012  Patient name: [name]  Medical record number: [id number]  Date of birth: 09/01/1960    This patient has been seen and discussed with the house staff. Please see their note for complete details. I concur with their findings with the following additions/corrections: [name] was found to have a new systolic cardiomyopathy with EF of 35-40%. B/C wall motion could not be R/O on the ECHO and pt has known non-obstructing CAD, pt is being taken to cath this AM. Pt was on metoprolol 25 BID and lisinopril 20 QD at home and after she is stable post-cath those will need to be resumed cautiously 2/2 low normal BP. We have started taking to her about cardiopul rehab once pt is stable and D/C'd home as I think it would be useful as pt has been limiting her activity 2/2 DOE.   BUTCHER,ELIZABETH 01/24/2012, 11:26 AM

## 2012-01-24 NOTE — Progress Notes (Signed)
Subjective: No chest pain this AM. Denies SOB. Denies fever, chill. No nausea or vomiting.  No acute interval events.  Objective: Vital signs in last 24 hours: Filed Vitals:   01/23/12 1302 01/23/12 1305 01/23/12 2100 01/24/12 0500  BP: 137/74 148/78 126/73 102/67  Pulse: 92 93 84 91  Temp:   98 F (36.7 C) 98.3 F (36.8 C)  TempSrc:   Oral Oral  Resp:      Height:      Weight:    255 lb (115.667 kg)  SpO2:   99% 98%   Weight change: -2 lb 12.8 oz (-1.27 kg)  Intake/Output Summary (Last 24 hours) at 01/24/12 0854 Last data filed at 01/23/12 1000  Gross per 24 hour  Intake      0 ml  Output      0 ml  Net      0 ml   Physical exam:  General: awake, alert, no acute distress  CV: RRR; no murmurs; TTP of chest wall on lower left sternal border  Resp: CTA bilaterally; no wheezes, rales, or rhonchi  Abd: soft, non-tender, non-distended; +BS  Ext: 2+ pulses; no edema  Lab Results: Basic Metabolic Panel:  Lab 01/23/12 2130 01/22/12 1837  NA 136 134*  K 4.7 4.3  CL 100 97  CO2 25 25  GLUCOSE 321* 347*  BUN 25* 27*  CREATININE 0.98 0.94  CALCIUM 9.3 9.6  MG -- --  PHOS -- --   Liver Function Tests:  Lab 01/23/12 0600  AST 11  ALT 10  ALKPHOS 67  BILITOT 0.1*  PROT 6.7  ALBUMIN 3.4*   CBC:  Lab 01/22/12 1837  WBC 13.1*  NEUTROABS --  HGB 11.6*  HCT 35.0*  MCV 86.6  PLT 293   Cardiac Enzymes:  Lab 01/23/12 0910 01/23/12 0203  CKTOTAL 75 88  CKMB 1.9 2.0  CKMBINDEX -- --  TROPONINI <0.30 <0.30   BNP:  Lab 01/22/12 1837  PROBNP 13.8   D-Dimer:  Lab 01/23/12 1243  DDIMER 0.37   CBG:  Lab 01/24/12 0733 01/24/12 0018 01/23/12 2025 01/23/12 1707 01/23/12 1135 01/23/12 0742  GLUCAP 389* 351* 256* 273* 164* 302*   Thyroid Function Tests:  Lab 01/23/12 0203  TSH 2.557  T4TOTAL --  FREET4 --  T3FREE --  THYROIDAB --   Urine Drug Screen: Drugs of Abuse     Component Value Date/Time   LABOPIA POSITIVE* 08/18/2008 1500   COCAINSCRNUR  NONE DETECTED 08/18/2008 1500   LABBENZ NONE DETECTED 08/18/2008 1500   AMPHETMU NONE DETECTED 08/18/2008 1500   THCU NONE DETECTED 08/18/2008 1500   LABBARB  Value: NONE DETECTED        DRUG SCREEN FOR MEDICAL PURPOSES ONLY.  IF CONFIRMATION IS NEEDED FOR ANY PURPOSE, NOTIFY LAB WITHIN 5 DAYS.        LOWEST DETECTABLE LIMITS FOR URINE DRUG SCREEN Drug Class       Cutoff (ng/mL) Amphetamine      1000 Barbiturate      200 Benzodiazepine   200 Tricyclics       300 Opiates          300 Cocaine          300 THC              50 08/18/2008 1500   Studies/Results: Dg Chest 2 View  01/22/2012  *RADIOLOGY REPORT*  Clinical Data: Chest pain and shortness of breath.  CHEST - 2 VIEW  Comparison: 10/28/2011.  Findings: The cardiac silhouette, mediastinal and hilar contours are within normal limits and stable.  Low lung volumes with vascular crowding and atelectasis.  There are chronic bronchitic changes related to smoking.  No infiltrates, edema or effusions. The bony thorax is intact.  IMPRESSION: Chronic bronchitic type lung changes related to smoking.  No acute pulmonary findings.  Original Report Authenticated By: P. Loralie Champagne, M.D.   Medications: I have reviewed the patient's current medications. Scheduled Meds:   . aspirin  81 mg Oral Daily  . dorzolamide-timolol  1 drop Right Eye BID  . ferrous sulfate  325 mg Oral Q breakfast  . furosemide  20 mg Oral Daily  . heparin  5,000 Units Subcutaneous Q8H  . insulin aspart  0-20 Units Subcutaneous TID WC  . insulin aspart  10 Units Subcutaneous Once  . insulin glargine  30 Units Subcutaneous QHS  . simvastatin  10 mg Oral q1800   Continuous Infusions:  PRN Meds:.acetaminophen, acetaminophen, gabapentin, morphine injection, nitroGLYCERIN, ondansetron (ZOFRAN) IV, ondansetron Assessment/Plan: 1. Chest pain - CE neg x 2, trop neg x 4. D-dimer wnl. Cards consulted, and recommended echo. Initially cards had recommended for CTA chest due to concern for pulm  embolism, but D-dimer was negative, so they deferred this study. Echo revealed ejection fraction of 35-40%. Secondary to this finding and presentation with chest pain, cardiology is now pursuing catheterization. Previous cath in 2008 revealed mid LCX/OM2 with 50% mid stenosis and mid to distal RCA with 30-40% stenosis.   2. Hypertension - Blood pressure stable. Resumed home lasix as patient is complaining of subjective swelling in both feet. New diagnosis of systolic CHF will require that patient also takes lisinopril and metoprolol, which she is on currently, but with which compliance is in question. - at discharge, have patient resume b-blocker and lisinopril, but at very low dose to avoid future hypotensive episodes  3. DM II - Increase lantus to 35U this evening, as blood sugars have been poorly controlled over previous 24 hours. Will consider further modification as needed - stress importance of compliance with insulin/oral anti-hyperglycemic regimen   4. Hyperlipidemia - Continue statin.  5. CAD - Undergoing cath today due to new systolic CHF noted on echo on 1/61/09. See above, last cath in 2008 with non-obstructive disease. Continue ASA daily.  6. DVT ppx - Heparin   7. Dispo - dispo at this point is pending findings from cath and any subsequent intervention deemed necessary by cardiology. Patient otherwise stable. The IM team will work towards simplifying her med regimen and stressing compliance prior to d/c, particularly as the patient has new systolic dysfunction discovered this admission. She would likely benefit from cardiopulmonary rehabilitation.   LOS: 2 days   Tasmia Blumer R 01/24/2012, 8:54 AM

## 2012-01-25 DIAGNOSIS — I509 Heart failure, unspecified: Secondary | ICD-10-CM

## 2012-01-25 LAB — GLUCOSE, CAPILLARY
Glucose-Capillary: 232 mg/dL — ABNORMAL HIGH (ref 70–99)
Glucose-Capillary: 284 mg/dL — ABNORMAL HIGH (ref 70–99)

## 2012-01-25 MED ORDER — LISINOPRIL 20 MG PO TABS: 10.0000 mg | ORAL_TABLET | Freq: Every day | ORAL | Status: DC

## 2012-01-25 MED ORDER — INSULIN GLARGINE 100 UNIT/ML ~~LOC~~ SOLN: 40.0000 [IU] | Freq: Every day | SUBCUTANEOUS | Status: DC

## 2012-01-25 NOTE — Progress Notes (Signed)
Internal Medicine Teaching Service Attending Note Date: 01/25/2012  Patient name: [name]  Medical record number: [id number]  Date of birth: 1960-09-27    This patient has been seen and discussed with the house staff. Please see their note for complete details. I concur with their findings with the following additions/corrections: [name] had no complications after her cath yesterday which showed non-obstructing CAD and basically no sig change from prior. She is stable for D/C home. Greatly appreciate cardic rehab and will see if pt quantifies for pul rehab as I feel that she would benefit greatly from a structured exercise program.  [facility] 01/25/2012, 3:34 PM

## 2012-01-25 NOTE — Progress Notes (Signed)
CARDIAC REHAB PHASE I   PRE:  Rate/Rhythm: 95SR  BP:  Supine:   Sitting: 118/70  Standing:    SaO2: 100%RA  MODE:  Ambulation: 500 ft   POST:  Rate/Rhythem: 109ST  BP:  Supine:   Sitting: 126/80  Standing:    SaO2: 97%RA 1332-1433 Pt walked 500 ft on RA with steady gait. Tired by end of walk but tolerated well. Denied chest pain. Education completed on signs/symptoms of CHF and when to notify MD. Importance of watching sodium and keeping below 2000 mg. Gave diabetic diet and encouraged pt to watch carbs and sodium. Pt very interested in followup exercise program. Medicare will not cover cardiac rehab for CHF and maintenance program is out of pocket expense. Will have pulm rehab coordinator follow up with pt. Medicare will sometimes cover pulm rehab for CHF. Pt given pulm rehab brochure.  Duanne Limerick

## 2012-01-25 NOTE — Progress Notes (Signed)
Subjective: Patient feeling well today. No shortness of breath or chest pain. Denies any other complaints. States she is ready to go home.  No interval events.  Objective: Vital signs in last 24 hours: Filed Vitals:   01/24/12 1900 01/24/12 2037 01/24/12 2100 01/25/12 0500  BP: 146/73 132/68 133/83 101/67  Pulse: 98 97 92 83  Temp:   97.7 F (36.5 C) 98 F (36.7 C)  TempSrc:   Oral Oral  Resp:   18 18  Height:      Weight:    254 lb 4.8 oz (115.35 kg)  SpO2:   98% 98%   Weight change: -11.2 oz (-0.318 kg) No intake or output data in the 24 hours ending 01/25/12 0955  Physical exam:  General: awake, alert, no acute distress  CV: RRR; no murmurs Resp: CTA bilaterally; no wheezes, rales, or rhonchi  Abd: soft, non-tender, non-distended; +BS  Ext: 2+ pulses; no edema  Lab Results: Basic Metabolic Panel:  Lab 01/24/12 1027 01/23/12 0600  NA 135 136  K 4.7 4.7  CL 97 100  CO2 29 25  GLUCOSE 358* 321*  BUN 23 25*  CREATININE 0.94 0.98  CALCIUM 10.3 9.3  MG -- --  PHOS -- --   Liver Function Tests:  Lab 01/23/12 0600  AST 11  ALT 10  ALKPHOS 67  BILITOT 0.1*  PROT 6.7  ALBUMIN 3.4*   CBC:  Lab 01/22/12 1837  WBC 13.1*  NEUTROABS --  HGB 11.6*  HCT 35.0*  MCV 86.6  PLT 293   Cardiac Enzymes:  Lab 01/23/12 0910 01/23/12 0203  CKTOTAL 75 88  CKMB 1.9 2.0  CKMBINDEX -- --  TROPONINI <0.30 <0.30   BNP:  Lab 01/22/12 1837  PROBNP 13.8   D-Dimer:  Lab 01/23/12 1243  DDIMER 0.37   CBG: Thyroid Function Tests:  Lab 01/23/12 0203  TSH 2.557  T4TOTAL --  FREET4 --  T3FREE --  THYROIDAB --   Urine Drug Screen: Drugs of Abuse     Component Value Date/Time   LABOPIA POSITIVE* 08/18/2008 1500   COCAINSCRNUR NONE DETECTED 08/18/2008 1500   LABBENZ NONE DETECTED 08/18/2008 1500   AMPHETMU NONE DETECTED 08/18/2008 1500   THCU NONE DETECTED 08/18/2008 1500   LABBARB  Value: NONE DETECTED        DRUG SCREEN FOR MEDICAL PURPOSES ONLY.  IF  CONFIRMATION IS NEEDED FOR ANY PURPOSE, NOTIFY LAB WITHIN 5 DAYS.        LOWEST DETECTABLE LIMITS FOR URINE DRUG SCREEN Drug Class       Cutoff (ng/mL) Amphetamine      1000 Barbiturate      200 Benzodiazepine   200 Tricyclics       300 Opiates          300 Cocaine          300 THC              50 08/18/2008 1500    Medications: I have reviewed the patient's current medications. Scheduled Meds:   . aspirin  81 mg Oral Daily  . dorzolamide-timolol  1 drop Right Eye BID  . fentaNYL      . ferrous sulfate  325 mg Oral Q breakfast  . furosemide  20 mg Oral Daily  . heparin      . heparin  5,000 Units Subcutaneous Q8H  . insulin aspart  0-20 Units Subcutaneous TID WC  . insulin aspart  25 Units Subcutaneous Once  .  insulin glargine  30 Units Subcutaneous QHS  . lidocaine      . metoprolol tartrate  12.5 mg Oral BID  . midazolam      . nitroGLYCERIN      . simvastatin  10 mg Oral q1800   Continuous Infusions:   . sodium chloride Stopped (01/24/12 2115)  . DISCONTD: sodium chloride     PRN Meds:.acetaminophen, acetaminophen, gabapentin, morphine injection, nitroGLYCERIN, ondansetron (ZOFRAN) IV, ondansetron, DISCONTD: acetaminophen, DISCONTD: ondansetron (ZOFRAN) IV Assessment/Plan: 1. CAD - Cath revealed 50% stenosis in mid-LAD with possible myocardial bridging. Deemed non-obstructive - continued med management  2. Systolic CHF - newly diagnosed per echo on 7/29. LVEF 35-40% with diffuse hypokinesis.   2. Hypertension - will be d/c'd on metoprolol, lisinopril, and lasix. Will proceed with caution with dosing these meds, as patient presented with hypotension on admission.   3. DMII - poorly controlled. Will follow-up in IM clinic and will receive further diabetic education at this time. Importance of compliance with insulin regimen has been stressed during hospital course.  4. Hyperlipidemia - Continue statin.   5. Dispo - very likely today. Discharge currently pending the  following: - Cardiology: pursuing 12 lead EKG and CE this morning. Will confirm patient is safe for discharge from their perspective - Care management: awaiting consult regarding cardiopulmonary rehabilitation.   LOS: 3 days   Deziah Renwick R 01/25/2012, 9:55 AM

## 2012-01-25 NOTE — Progress Notes (Signed)
Met with patient today prior to discharge per physician referral. Patient willing to work with diabetes educator once discharged to improve her diabetes control. Appointment scheduled for 01/30/12 same days as doctor follow up. Encouraged cardiac rehab as they also have a diabetes educator and feel patient will benefit from supervised and group physical activity.

## 2012-01-25 NOTE — Progress Notes (Signed)
Patient ID: [name], female   DOB: [birth year], 51 y.o.   MRN: [id number]    Subjective:  She spent a fair night. She has no chest pain or shortness of breath.  She has pain or bleeding from the PCI access area. She has questions about what it means to have a weak heart muscle.  Objective:  Vital Signs in the last 24 hours: Temp:  [97.6 F (36.4 C)-98 F (36.7 C)] 98 F (36.7 C) (07/31 0500) Pulse Rate:  [81-100] 83  (07/31 0500) Resp:  [18] 18  (07/31 0500) BP: (101-154)/(67-98) 101/67 mmHg (07/31 0500) SpO2:  [97 %-98 %] 98 % (07/31 0500) Weight:  [254 lb 4.8 oz (115.35 kg)] 254 lb 4.8 oz (115.35 kg) (07/31 0500)  Intake/Output from previous day:    Physical Exam: Pt is alert and oriented, NAD HEENT: normal Neck: JVP - normal, carotids 2+= without bruits Lungs: CTA bilaterally CV: RRR without murmur or gallop Abd: soft, NT, Positive BS, no hepatomegaly. The femoral access area in the right groin looks clean and dry. No bleeding or tenderness. No sweating Ext: no C/C/E, distal pulses intact and equal Skin: warm/dry no rash   Lab Results:  [name] 01/22/12 1837  WBC 13.1*  HGB 11.6*  PLT 293    Basename 01/24/12 1131 01/23/12 0600  NA 135 136  K 4.7 4.7  CL 97 100  CO2 29 25  GLUCOSE 358* 321*  BUN 23 25*  CREATININE 0.94 0.98    Basename 01/23/12 0910 01/23/12 0203  TROPONINI <0.30 <0.30    Cardiac Studies: PCI Conclusions on 01/24/2012:  1. Minor coronary irregularities. There is a 50% stenosis in the mid LAD that may be associated with myocardial bridging. It does not appear to obstruct flow.  2. Moderate LV dysfunction with EF of 40-45%   Tele: No overnight cardiac events  Assessment/Plan:   1. Chest pain and shortness of breath. The patient's cause of shortness of breath and chest pain as reported on admission is most likely due to left ventricular dysfunction and HF from hypertension heart disease. Coronary heart disease is playing a  minor role as shown by from the coronary angiography showing 50% stenosis in the LAD. -Treatment will be optimized with ACEIs and a because beta blockers and diuretics.  - I have counseled her about restricting salt and water intake. - She will continue with simvastatin.  - She will follow up as outpatient. - Repeat, 12lead EKG, cardiac enzymes and a BMET this AM - I will discuss with Dr. Excell Seltzer about further opinions on the care of this patient.   2. Hypertension Patient's blood pressure has been previously well-controlled on metoprolol and lisinopril.  -Will continue treatment for high blood pressure.    Dow Adolph, M.D. 01/25/2012, 7:02 AM  Patient seen, examined. Available data reviewed. Agree with findings, assessment, and plan as outlined by Dr Zada Girt. Cardiac cath study from yesterday was reviewed and there is no high-grade obstructive coronary disease. Considering her reduced left ventricular ejection fraction, would consider change of her beta blocker from metoprolol tartrate to metoprolol succinate. Since her blood pressure has been in the low-normal range, consider reduction of lisinopril dose to 10 mg daily. Review of her chart shows a 20 mg dose as her home dose. I will arrange cardiac followup within a few weeks of discharge. Please feel free to call if any questions.  Tonny Bollman, M.D. 01/25/2012 10:55 AM

## 2012-01-29 NOTE — Discharge Summary (Signed)
Patient Name:  [name] MRN: [id number]  PCP: Manuela Schwartz, MD DOB:  [birth year]       Date of Admission:  01/22/2012  Date of Discharge:  01/25/2012     Attending Physician: Dr. Blanch Media      DISCHARGE DIAGNOSES: Systolic CHF CAD Hypertension Diabetes Mellitus, Type 2 Hyperlipidemia  DISPOSITION AND FOLLOW-UP: [name] is to follow-up with the listed providers as detailed below, at which time, the following should be addressed:   1. IM Clinic - follow-up on BP control as patient has had addition of metoprolol and lisinopril to med regimen 2/2 newly diagnosed CHF. Question patient regarding symptoms of hypotension, as she was hypotensive on admission which was prior to escalation of anti-HTN regimen.  Follow-up Information    Follow up with LI, NA, MD on 01/30/2012. (INTERNAL MEDICINE OUTPATIENT CLINIC -  You have an appointment on 01/30/2012 at 08:15AM)    Contact information:   [address] [place] [address] [phone]       Follow up with Charlton Haws, MD on 01/30/2012. (CARDIOLOGY - You have an appointment at 11:00AM - Please arrive  by10:45AM.)    Contact information:   [address], [address] [place] [address] [phone]         Discharge Orders    Future Appointments: Provider: Department: Dept Phone: Center:   01/30/2012 8:15 AM Dede Query, MD [facility] [phone] [facility]   01/30/2012 9:30 AM Cecil Cranker Plyler, RD [facility] [phone] [facility]   01/30/2012 11:00 AM Wendall Stade, MD [facility] [phone] [id number]     Future Orders Please Complete By Expires   Diet - low sodium heart healthy      Increase activity slowly      (HEART FAILURE PATIENTS) Call MD:  Anytime you have any of the following symptoms: 1) 3 pound weight gain in 24 hours or 5 pounds in 1 week 2) shortness of breath, with or without a dry hacking cough 3) swelling in the  hands, feet or stomach 4) if you have to sleep on extra pillows at night in order to breathe.      Call MD for:  extreme fatigue      Call MD for:  persistant dizziness or light-headedness      Call MD for:  difficulty breathing, headache or visual disturbances          DISCHARGE MEDICATIONS: Medication List  As of 01/29/2012 11:12 PM   TAKE these medications         amitriptyline 25 MG tablet   Commonly known as: ELAVIL   Take 25 mg by mouth at bedtime.      aspirin 81 MG tablet   Take 81 mg by mouth daily.      cyclobenzaprine 10 MG tablet   Commonly known as: FLEXERIL   Take 10 mg by mouth every 8 (eight) hours as needed. For muscle spasms      dorzolamide-timolol 22.3-6.8 MG/ML ophthalmic solution   Commonly known as: COSOPT   Place 1 drop into the right eye 2 (two) times daily.      ferrous sulfate 325 (65 FE) MG tablet   Take 325 mg by mouth daily with breakfast.      furosemide 20 MG tablet   Commonly known as: LASIX   Take 20 mg by mouth daily.  gabapentin 600 MG tablet   Commonly known as: NEURONTIN   Take 600 mg by mouth 2 (two) times daily.      insulin aspart 100 UNIT/ML injection   Commonly known as: novoLOG   Inject 3-6 Units into the skin 3 (three) times daily before meals. Per sliding scale      insulin glargine 100 UNIT/ML injection   Commonly known as: LANTUS   Inject 40 Units into the skin at bedtime.      lisinopril 20 MG tablet   Commonly known as: PRINIVIL,ZESTRIL   Take 0.5 tablets (10 mg total) by mouth daily.      metFORMIN 1000 MG tablet   Commonly known as: GLUCOPHAGE   Take 1 tablet (1,000 mg total) by mouth 2 (two) times daily with a meal.      metoprolol tartrate 25 MG tablet   Commonly known as: LOPRESSOR   Take 0.5 tablets (12.5 mg total) by mouth 2 (two) times daily.      naproxen 375 MG tablet   Commonly known as: NAPROSYN   Take 375 mg by mouth 2 (two) times daily as needed. With meals      omeprazole 20 MG capsule    Commonly known as: PRILOSEC   Take 2 capsules (40 mg total) by mouth daily.      pravastatin 20 MG tablet   Commonly known as: PRAVACHOL   Take 20 mg by mouth daily.      promethazine 12.5 MG tablet   Commonly known as: PHENERGAN   Take 12.5 mg by mouth every 6 (six) hours as needed. For nausea      traMADol 50 MG tablet   Commonly known as: ULTRAM   Take 50 mg by mouth every 6 (six) hours as needed. For pain             CONSULTS:  Dr. Excell Seltzer -- [facility]   PROCEDURES PERFORMED:  Dg Chest 2 View  01/22/2012  *RADIOLOGY REPORT*  Clinical Data: Chest pain and shortness of breath.  CHEST - 2 VIEW  Comparison: 10/28/2011.  Findings: The cardiac silhouette, mediastinal and hilar contours are within normal limits and stable.  Low lung volumes with vascular crowding and atelectasis.  There are chronic bronchitic changes related to smoking.  No infiltrates, edema or effusions. The bony thorax is intact.  IMPRESSION: Chronic bronchitic type lung changes related to smoking.  No acute pulmonary findings.  Original Report Authenticated By: P. Loralie Champagne, M.D.       ADMISSION DATA: H&P: Ms. Grills 51 yo AA woman pmh HTN, HLD, DM type 2, GERD p/w worsening CP. She describes the pain as a crushing pressure/ squeezing characteristic starting in her center chest and then radiating to her L arm. She states that pain has been intermittent throughout the whole day. Exacerbated by movement and semi relieved with rest. She is diaphoretic sometimes with the pressure and has some SOB. She was nauseous and had one episode of vomiting that was relieved with some home meds. She has had several complaints of tachycardic sensations with no palpitations that are worse w/exercise and that can sometimes increase her SOB and produce some leg swelling. That was addressed at her last clinic visit and she was started on Lasix that she states has helped. She has had a HA for the past 2 days that is semi  relieved with ASA that she tries to take daily for her heart. She doesn't describe any new stresses in her life.  She said that her mother had clotting disorder that caused early MIs. Her pain was semi relieved with a NTG that she received in the ED.  Physical Exam: Blood pressure 111/57, pulse 99, temperature 97.9 F (36.6 C), temperature source Oral, resp. rate 19, last menstrual period 02/13/2011, SpO2 99.00%.  General: resting in bed, comfortable, NAD  HEENT: PERRL, EOMI, no scleral icterus, no JVD  Cardiac: RRR, no rubs, murmurs or gallops  Pulm: CTAB, moving normal volumes of air  Abd: soft, nontender, nondistended, BS present  Ext: warm and well perfused, no pedal edema  Neuro: alert and oriented X3, cranial nerves II-XII grossly intact  Labs: Basic Metabolic Panel:   Basename  01/22/12 1837   NA  134*   K  4.3   CL  97   CO2  25   GLUCOSE  347*   BUN  27*   CREATININE  0.94   CALCIUM  9.6   MG  --   PHOS  --    CBC:   Basename  01/22/12 1837   WBC  13.1*   NEUTROABS  --   HGB  11.6*   HCT  35.0*   MCV  86.6   PLT  293    Cardiac Enzymes:  Troponin [facility] Test)   Basename  01/22/12 2203   TROPIPOC  0.00    BNP:   Basename  01/22/12 1837   PROBNP  13.8    HOSPITAL COURSE: Systolic CHF - newly diagnosed per echo on 7/29. LVEF 35-40% with diffuse hypokinesis. Subsequent cath revealed only mild/moderate disease that is unlikely to have played a major role in development of patient's CHF. Her systolic CHF was likely the cause of her SOB and chest pain on admission (HTN-mediated heart failure -> left ventricular dysfunction).  She has a follow-up appointment scheduled with [facility] for further assessment of this issue. During her hospital course following echo results revealing CHF, she was started on metoprolol 12.5mg  bid and lisinopril 10mg  once per day. She was on lasix for pedal edema prior to this admission, and was discharged without any  changes made to her home dose. The patient was mildly hypotensive on presentation, however this did not recur during her hospital course and thus there was not a great concern with her being initiated on low doses of two new anti-HTN medications for her newly diagnosed CHF.  Non-occlusive CAD - Only mild/moderate disease was found on cath, with the most significant stenosis being 50% in mid-LAD, with possible myocardial bridging. Patient on ASA, statin. Will follow-up with cardiology.  Hypertension - Patient began metoprolol and lisinopril during hospital course for her newly diagnosed systolic CHF, with no hypotensive BPs or symptomatology.  Diabetes Mellitus, Type 2 - A1c = 9.4. Metformin was held during hospital course, and patient's blood sugars were not very well-controlled on basal glargine 30U + SSI. Patient's home lantus dose is 35U qhs, although compliance is questionable. Given very poor control on insulin just slightly lower than home dose, patient was discharged with instructions to take 40U lantus qhs. She was seen by diabetes education while an inpatient, and will follow-up with diabetes education in the IM Clinic at the time of her follow-up visit.  Hyperlipidemia - No lipid panel was drawn during hospital course, as patient had LDL of 74 in 10/2011. Thus, issue appears stable on home dose of pravastatin.  Dispo - cardiac rehab was pursued following new diagnosis of CHF, however patient did not qualify for  this given her [employer], and she was not willing/able to pay for rehabilitation out-of-pocket, either in a lump sum or on a payment plan.   DISCHARGE DATA: Vital Signs: BP 118/70  Pulse 95  Temp 97.9 F (36.6 C) (Oral)  Resp 18  Ht 5\' 6"  (1.676 m)  Wt 254 lb 4.8 oz (115.35 kg)  BMI 41.05 kg/m2  SpO2 100%  LMP 02/13/2011  Signed: Elfredia Nevins, MD   PGY I, Internal Medicine Resident 01/29/2012, 11:13 PM

## 2012-01-30 ENCOUNTER — Ambulatory Visit (INDEPENDENT_AMBULATORY_CARE_PROVIDER_SITE_OTHER): Payer: Medicare Other | Admitting: Cardiovascular Disease

## 2012-01-30 ENCOUNTER — Encounter: Payer: Self-pay | Admitting: Cardiovascular Disease

## 2012-01-30 ENCOUNTER — Encounter: Payer: Self-pay | Admitting: Internal Medicine

## 2012-01-30 ENCOUNTER — Ambulatory Visit (INDEPENDENT_AMBULATORY_CARE_PROVIDER_SITE_OTHER): Payer: Medicare Other | Admitting: Internal Medicine

## 2012-01-30 ENCOUNTER — Encounter: Payer: Medicare Other | Admitting: Dietician

## 2012-01-30 VITALS — BP 139/77 | HR 106 | Temp 97.1°F | Ht 66.0 in | Wt 261.5 lb

## 2012-01-30 VITALS — BP 119/78 | HR 105 | Wt 259.0 lb

## 2012-01-30 DIAGNOSIS — E1142 Type 2 diabetes mellitus with diabetic polyneuropathy: Secondary | ICD-10-CM

## 2012-01-30 DIAGNOSIS — I5020 Unspecified systolic (congestive) heart failure: Secondary | ICD-10-CM

## 2012-01-30 DIAGNOSIS — I10 Essential (primary) hypertension: Secondary | ICD-10-CM

## 2012-01-30 DIAGNOSIS — E1149 Type 2 diabetes mellitus with other diabetic neurological complication: Secondary | ICD-10-CM

## 2012-01-30 DIAGNOSIS — E1165 Type 2 diabetes mellitus with hyperglycemia: Secondary | ICD-10-CM

## 2012-01-30 DIAGNOSIS — I5042 Chronic combined systolic (congestive) and diastolic (congestive) heart failure: Secondary | ICD-10-CM | POA: Insufficient documentation

## 2012-01-30 DIAGNOSIS — E1140 Type 2 diabetes mellitus with diabetic neuropathy, unspecified: Secondary | ICD-10-CM

## 2012-01-30 DIAGNOSIS — E669 Obesity, unspecified: Secondary | ICD-10-CM

## 2012-01-30 DIAGNOSIS — E785 Hyperlipidemia, unspecified: Secondary | ICD-10-CM

## 2012-01-30 LAB — BASIC METABOLIC PANEL WITH GFR
GFR, Est African American: 87 mL/min
GFR, Est Non African American: 75 mL/min
Glucose, Bld: 157 mg/dL — ABNORMAL HIGH (ref 70–99)
Potassium: 5.4 mEq/L — ABNORMAL HIGH (ref 3.5–5.3)
Sodium: 137 mEq/L (ref 135–145)

## 2012-01-30 LAB — MAGNESIUM: Magnesium: 1.6 mg/dL (ref 1.5–2.5)

## 2012-01-30 NOTE — Addendum Note (Signed)
Addended byDierdre Searles, Brandon Wiechman on: 01/30/2012 10:46 AM   Modules accepted: Level of Service

## 2012-01-30 NOTE — Assessment & Plan Note (Signed)
Cholesterol is at goal.  Continue current dose of statin and diet Rx.  No myalgias or side effects.  F/U  LFT's in 6 months. Lab Results  Component Value Date   LDLCALC 74 10/29/2011

## 2012-01-30 NOTE — Progress Notes (Signed)
Patient ID: [name], female   DOB: Sep 02, 1960, 51 y.o.   MRN: [id number] 51 yo patient on my schedule as a new evaluation.  However was just seen in hospital by Dr Excell Seltzer 7/27.  Pmh HTN, HLD, DM type 2, GERD pwith worsening CP.  She describes the pain as a crushing pressure/ squeezing characteristic starting in her center chest and then radiating to her L arm. She reports that she started experiencing symptoms when she was getting out of bed however, she had developed shortness of breath the night before which she ignored. She states that pain had been intermittent throughout the whole day and reported to the ED 12hrs later. The chest pain was associated with palpitations and sweating. The pain was exacerbated by movement and semi relieved with rest. She was nauseous and had one episode of vomiting that was relieved with some home meds. She has had several complaints of tachycardic sensations with no palpitations that are worse w/exercise and that can sometimes increase her SOB and produce some leg swelling. That was addressed at her last clinic visit and she was started on Lasix that she states has helped. Her legs are not swollen now at all.  She report history of PND about 3 times over the last one month. She props up herself in with 5 pillows in bed. She is only able to walk less than one block on flat surface before she becomes short of breath. She reports that she unable to go climb the steps in her house due to SOB. She reports no history of syncope but she 'comes close to it' that when stands up.   Cath 01/22/12  Nahser Conclusions:  1. Minor coronary irregularities. There is a 50% stenosis in the mid LAD that may be associated with myocardial bridging. It does not appear to obstruct flow.  2. Moderate LV dysfunction with EF of 40-45%  Continue with aggressive medical therapy for HTN.  Diagnoses with nonischemic DCM mild.  Since D/C 7/30 ok.  Compliant with meds.  Discussed low carb diet and  salt restriction.    ROS: Denies fever, malais, weight loss, blurry vision, decreased visual acuity, cough, sputum, SOB, hemoptysis, pleuritic pain, palpitaitons, heartburn, abdominal pain, melena, lower extremity edema, claudication, or rash.  All other systems reviewed and negative  General: Affect appropriate Healthy:  appears stated age HEENT: normal Neck supple with no adenopathy JVP normal no bruits no thyromegaly Lungs clear with no wheezing and good diaphragmatic motion Heart:  S1/S2 no murmur, no rub, gallop or click PMI normal Abdomen: benighn, BS positve, no tenderness, no AAA no bruit.  No HSM or HJR Distal pulses intact with no bruits No edema Neuro non-focal Skin warm and dry No muscular weakness   Current Outpatient Prescriptions  Medication Sig Dispense Refill  . amitriptyline (ELAVIL) 25 MG tablet Take 25 mg by mouth at bedtime.      Marland Kitchen aspirin 81 MG tablet Take 81 mg by mouth daily.      . cyclobenzaprine (FLEXERIL) 10 MG tablet Take 10 mg by mouth every 8 (eight) hours as needed. For muscle spasms      . dorzolamide-timolol (COSOPT) 22.3-6.8 MG/ML ophthalmic solution Place 1 drop into the right eye 2 (two) times daily.      . ferrous sulfate 325 (65 FE) MG tablet Take 325 mg by mouth daily with breakfast.      . furosemide (LASIX) 20 MG tablet Take 20 mg by mouth daily.      Marland Kitchen  gabapentin (NEURONTIN) 600 MG tablet Take 600 mg by mouth 2 (two) times daily.      . insulin aspart (NOVOLOG) 100 UNIT/ML injection Inject 3-6 Units into the skin 3 (three) times daily before meals. Per sliding scale       . insulin glargine (LANTUS) 100 UNIT/ML injection Inject 40 Units into the skin at bedtime.  10 mL  12  . lisinopril (PRINIVIL,ZESTRIL) 20 MG tablet Take 0.5 tablets (10 mg total) by mouth daily.  30 tablet  1  . metFORMIN (GLUCOPHAGE) 1000 MG tablet Take 1 tablet (1,000 mg total) by mouth 2 (two) times daily with a meal.  60 tablet  5  . metoprolol tartrate (LOPRESSOR)  25 MG tablet Take 0.5 tablets (12.5 mg total) by mouth 2 (two) times daily.  30 tablet  11  . omeprazole (PRILOSEC) 20 MG capsule Take 2 capsules (40 mg total) by mouth daily.  30 capsule  11  . pravastatin (PRAVACHOL) 20 MG tablet Take 20 mg by mouth daily.      . promethazine (PHENERGAN) 12.5 MG tablet Take 12.5 mg by mouth every 6 (six) hours as needed. For nausea      . traMADol (ULTRAM) 50 MG tablet Take 50 mg by mouth every 6 (six) hours as needed. For pain      . DISCONTD: gabapentin (NEURONTIN) 600 MG tablet Take 1 tablet (600 mg total) by mouth 3 (three) times daily.  90 tablet  3  . DISCONTD: promethazine (PHENERGAN) 12.5 MG tablet Take 1 tablet (12.5 mg total) by mouth every 8 (eight) hours as needed for nausea.  15 tablet  0    Allergies  Review of patient's allergies indicates no known allergies.  Electrocardiogram:  Assessment and Plan

## 2012-01-30 NOTE — Assessment & Plan Note (Signed)
Her BMI is over 40. She is aware that she needs weight loss  - diet and lifestyle changes - cardiac rehab starting next week.

## 2012-01-30 NOTE — Patient Instructions (Addendum)
1. CHF educations given - diet and fluid restriction discussed. 2. Continue follow up with your cardiologist 3. Check BMP and Mg today 4. Weigh yourself daily. 5. Follow  Up in 3 months.   Heart Failure Heart failure (HF) is a condition in which the heart has trouble pumping blood. This means your heart does not pump blood efficiently for your body to work well. In some cases of HF, fluid may back up into your lungs or you may have swelling (edema) in your lower legs. HF is a long-term (chronic) condition. It is important for you to take good care of yourself and follow your caregiver's treatment plan. CAUSES   Health conditions:   High blood pressure (hypertension) causes the heart muscle to work harder than normal. When pressure in the blood vessels is high, the heart needs to pump (contract) with more force in order to circulate blood throughout the body. High blood pressure eventually causes the heart to become stiff and weak.   Coronary artery disease (CAD) is the buildup of cholesterol and fat (plaques) in the arteries of the heart. The blockage in the arteries deprives the heart muscle of oxygen and blood. This can cause chest pain and may lead to a heart attack. High blood pressure can also contribute to CAD.   Heart attack (myocardial infarction) occurs when 1 or more arteries in the heart become blocked. The loss of oxygen damages the muscle tissue of the heart. When this happens, part of the heart muscle dies. The injured tissue does not contract as well and weakens the heart's ability to pump blood.   Abnormal heart valves can cause HF when the heart valves do not open and close properly. This makes the heart muscle pump harder to keep the blood flowing.   Heart muscle disease (cardiomyopathy or myocarditis) is damage to the heart muscle from a variety of causes. These can include drug or alcohol abuse, infections, or unknown reasons. These can increase the risk of HF.   Lung  disease makes the heart work harder because the lungs do not work properly. This can cause a strain on the heart leading it to fail.   Diabetes increases the risk of HF. High blood sugar contributes to high fat (lipid) levels in the blood. Diabetes can also cause slow damage to tiny blood vessels that carry important nutrients to the heart muscle. When the heart does not get enough oxygen and food, it can cause the heart to become weak and stiff. This leads to a heart that does not contract efficiently.   Other diseases can contribute to HF. These include abnormal heart rhythms, thyroid problems, and low blood counts (anemia).   Unhealthy lifestyle habits:   Obesity.   Smoking.   Eating foods high in fat and cholesterol.   Eating or drinking beverages high in salt.   Drug or alcohol abuse.   Lack of exercise.  SYMPTOMS  HF symptoms may vary and can be hard to detect. Symptoms may include:  Shortness of breath with activity, such as climbing stairs.   Persistent cough.   Swelling of the feet, ankles, legs, or abdomen.   Unexplained weight gain.   Difficulty breathing when lying flat.   Waking from sleep because of the need to sit up and get more air.   Rapid heartbeat.   Fatigue and loss of energy.   Feeling lightheaded or close to fainting.  DIAGNOSIS  A diagnosis of HF is based on your history, symptoms, physical  examination, and diagnostic tests. Diagnostic tests for HF may include:  EKG.   Chest X-ray.   Blood tests.   Exercise stress test.   Blood oxygen test (arterial blood gas).   Evaluation by a heart doctor (cardiologist).   Ultrasound evaluation of the heart (echocardiogram).   Heart artery test to look for blockages (angiogram).   Radioactive imaging to look at the heart (radionuclide test).  TREATMENT  Treatment is aimed at managing the symptoms of HF. Medicines, lifestyle changes, or surgical intervention may be necessary to treat  HF.  Medicines to help treat HF may include:   Angiotensin-converting enzyme (ACE) inhibitors. These block the effects of a blood protein called angiotensin-converting enzyme. ACE inhibitors relax (dilate) the blood vessels and help lower blood pressure. This decreases the workload of the heart, slows the progression of HF, and improves symptoms.   Angiotensin receptor blockers (ARBs). These medications work similar to ACE inhibitors. ARBs may be an alternative for people who cannot tolerate an ACE inhibitor.   Aldosterone antagonists. This medication helps get rid of extra fluid from your body. This lowers the volume of blood the heart has to pump.   Water pills (diuretics). Diuretics cause the kidneys to remove salt and water from the blood. The extra fluid is removed by urination. By removing extra fluid from the body, diuretics help lower the workload of the heart and help prevent fluid buildup in the lungs so breathing is easier.   Beta blockers. These prevent the heart from beating too fast and improve heart muscle strength. Beta blockers help maintain a normal heart rate, control blood pressure, and improve HF symptoms.   Digitalis. This increases the force of the heartbeat and may be helpful to people with HF or heart rhythm problems.   Healthy lifestyle changes include:   Stopping smoking.   Eating a healthy diet. Avoid foods high in fat. Avoid foods fried in oil or made with fat. A dietician can help with healthy food choices.   Limiting how much salt you eat.   Limiting alcohol intake to no more than 1 drink per day for women and 2 drinks per day for men. Drinking more than that is harmful to your heart. If your heart has already been damaged by alcohol or you have severe HF, drinking alcohol should be stopped completely.   Exercising as directed by your caregiver.   Surgical treatment for HF may include:   Procedures to open blocked arteries, repair damaged heart valves, or  remove damaged heart muscle tissue.   A pacemaker to help heart muscle function and to control certain abnormal heart rhythms.   A defibrillator to possibly prevent sudden cardiac death.  HOME CARE INSTRUCTIONS   Activity level. Your caregiver can help you determine what type of exercise program may be helpful. It is important to maintain your strength. Pace your physical activity to avoid shortness of breath or chest pain. Rest for 1 hour before and after meals. A cardiac rehabilitation program may be helpful to some people with HF.   Diet. Eat a heart healthy diet. Food choices should be low in saturated fat and cholesterol. Talk to a dietician to learn about heart healthy foods.   Salt intake. When you have HF, you need to limit the amount of salt you eat. Eat less than 1500 milligrams (mg) of salt per day or as recommended by your caregiver.   Weight monitoring. Weigh yourself every day. You should weigh yourself in the morning after  you urinate and before you eat breakfast. Wear the same amount of clothing each time you weigh yourself. Record your weight daily. Bring your recorded weights to your clinic visits. Tell your caregiver right away if you have gained 3 lb/1.4 kg in 1 day, or 5 lb/2.3 kg in a week or whatever amount you were told to report.   Blood pressure monitoring. This should be done as directed by your caregiver. A home blood pressure cuff can be purchased at a drugstore. Record your blood pressure numbers and bring them to your clinic visits. Tell your caregiver if you become dizzy or lightheaded upon standing up.   Smoking. If you are currently a smoker, it is time to quit. Nicotine makes your heart work harder by causing your blood vessels to constrict. Do not use nicotine gum or patches before talking to your caregiver.   Follow up. Be sure to schedule a follow-up visit with your caregiver. Keep all your appointments.  SEEK MEDICAL CARE IF:   Your weight increases by 3  lb/1.4 kg in 1 day or 5 lb/2.3 kg in a week.   You notice increasing shortness of breath that is unusual for you. This may happen during rest, sleep, or with activity.   You cough more than normal, especially with physical activity.   You notice more swelling in your hands, feet, ankles, or belly (abdomen).   You are unable to sleep because it is hard to breathe.   You cough up bloody mucus (sputum).   You begin to feel "jumping" or "fluttering" sensations (palpitations) in your chest.  SEEK IMMEDIATE MEDICAL CARE IF:   You have severe chest pain or pressure which may include symptoms such as:   Pain or pressure in the arms, neck, jaw, or back.   Feeling sweaty.   Feeling sick to your stomach (nauseous).   Feeling short of breath while at rest.   Having a fast or irregular heartbeat.   You experience stroke symptoms. These symptoms include:   Facial weakness or numbness.   Weakness or numbness in an arm, leg, or on one side of your body.   Blurred vision.   Difficulty talking or thinking.   Dizziness or fainting.   Severe headache.  THESE ARE MEDICAL EMERGENCIES. Do not wait to see if the symptoms go away. Call your local emergency services (911 in [place] DO NOT drive yourself to the hospital. IMPORTANT  Make a list of every medicine, vitamin, or herbal supplement you are taking. Keep the list with you at all times. Show it to your caregiver at every visit. Keep the list up-to-date.   Ask your caregiver or pharmacist to write an explanation of each medicine you are taking. This should include:   Why you are taking it.   The possible side effects.   The best time of day to take it.   Foods to take with it or what foods to avoid.   When to stop taking it.  MAKE SURE YOU:   Understand these instructions.   Will watch your condition.   Will get help right away if you are not doing well or get worse.  Document Released: 06/13/2005 Document Revised: 06/02/2011  Document Reviewed: 09/25/2009 [facility] Patient Information 2012 [place], [place].

## 2012-01-30 NOTE — Assessment & Plan Note (Signed)
BP well controlled.patient reports SBP at 125's at home.   - will not titrate her medications today since she may need increase her Lasix dosage, will defer it to her cardiologist.

## 2012-01-30 NOTE — Assessment & Plan Note (Signed)
HGB A1C 9.4 in July/13.  Her Lantus increased to 40 units QHS since discharge. She uses 2-3 units of Novolog for meal coverage. She takes metformin as well. She did not bring her meter today. She reports that her blood glucose ranges at 78-165 since discharge.   - will continue Current regimen - ACEI and statin on regimen - monitor BMP Next HGB A1C in October.

## 2012-01-30 NOTE — Assessment & Plan Note (Signed)
Well controlled.  Continue current medications and low sodium Dash type diet.    

## 2012-01-30 NOTE — Assessment & Plan Note (Signed)
Clinically much improved. Symptoms better since discharge. Able to tolerate mild activity without problems. Still mild to moderate volume overload on exam. Weight up at least ~ 4 Lbs since discharge. On Lasix 20 daily, Lisinorpil 10 mg daily and metoprolol 12.5 mg BID and statin  Plan: - she needs CHF educations especially fluid restriction. - will consider to increase her lasix dosage, she has an appt with Cardiologist Dr. Eden Emms at 1100 am today, will defer it to Dr. Eden Emms - will obtain her BMP and Mg today. - she may also need to eventually up her ACEI and BB dosage if BP tolerates well. Will defer it to Cardiology. - patient will start Cardiac rehab next week.

## 2012-01-30 NOTE — Progress Notes (Signed)
Subjective:   Patient ID: [name] female   DOB: 06-Feb-1961 51 y.o.   MRN: [id number]  HPI: [name] is a 51 y.o. woman with PMH of HTN, DM, Morbid Obesity (>40), recent discharge for newly diagnosed CHF and nonobstructive CAD on 01/25/12. Patient was discharged on Lasix, Metoprolol, Lisinopril, Pravastatin for HF management. Her insulin was increased to 40 units QHS. She is here for hospital follow up.  Cardiac cath on 01/22/12:  1. Minor coronary irregularities. There is a 50% stenosis in the mid LAD that may be associated with myocardial bridging. It does not appear to obstruct flow.  2. Moderate LV dysfunction with EF of 40-45%  Echo on 01/23/12  ventricle: The cavity size was normal. Wall thickness was increased in a pattern of mild LVH. Systolic function was moderately reduced. The estimated ejection fraction was in the range of 35% to 40%. Diffuse hypokinesis. Regional wall motion abnormalities cannot be excluded.  Patient states that she feels much better. Denies SOB at rest/Orthopnea/PND since discharge. She has not started her regular exercise yet. But she is able to walk around her house, do lighthouse work like washing dishes, making bed and vacuuming floors, go to [employer] and walk to mailbox without any problems. She did climb one flight of steps with mild DOE. She reports compliance with her low sodium diet and fluid retriction of 2 L. She weighs herself daily. Her weight at home was 258 lbs this morning which is 4 lbs up since her discharge.  During the interview, she seems a little unsure about what fluid she can drink. I suspect that she needs more education on fluid restriction.    Past Medical History  Diagnosis Date  . Hypertension   . Hyperlipidemia   . GERD (gastroesophageal reflux disease)   . Adhesive capsulitis of shoulder     bilateral, Steroid injection Dr. Nedra Hai 1/12 bilaterally  . Peripheral neuropathy   . Adhesive capsulitis of right  shoulder     Steroid injection Dr. Nedra Hai 1/12  . Adhesive capsulitis of left shoulder     Steroid injection Dr. Nedra Hai 1/12  . Obesity   . Diabetic retinopathy   . Glaucoma   . Diabetes mellitus     Type II, insulin dependent  . CAD (coronary artery disease)     nonobstructive. Last cardiac cath (2008) showing left circumflex with mid 50% stenosis and distal luminal irregularities. Also with RCA with mid to distal 30-40% stenosis. // Previously evaluated by [name], never followed up outpatient.   Current Outpatient Prescriptions  Medication Sig Dispense Refill  . amitriptyline (ELAVIL) 25 MG tablet Take 25 mg by mouth at bedtime.      Marland Kitchen aspirin 81 MG tablet Take 81 mg by mouth daily.      . cyclobenzaprine (FLEXERIL) 10 MG tablet Take 10 mg by mouth every 8 (eight) hours as needed. For muscle spasms      . dorzolamide-timolol (COSOPT) 22.3-6.8 MG/ML ophthalmic solution Place 1 drop into the right eye 2 (two) times daily.      . ferrous sulfate 325 (65 FE) MG tablet Take 325 mg by mouth daily with breakfast.      . furosemide (LASIX) 20 MG tablet Take 20 mg by mouth daily.      Marland Kitchen gabapentin (NEURONTIN) 600 MG tablet Take 600 mg by mouth 2 (two) times daily.      . insulin aspart (NOVOLOG) 100 UNIT/ML injection Inject 3-6 Units into the skin 3 (  three) times daily before meals. Per sliding scale       . insulin glargine (LANTUS) 100 UNIT/ML injection Inject 40 Units into the skin at bedtime.  10 mL  12  . lisinopril (PRINIVIL,ZESTRIL) 20 MG tablet Take 0.5 tablets (10 mg total) by mouth daily.  30 tablet  1  . metFORMIN (GLUCOPHAGE) 1000 MG tablet Take 1 tablet (1,000 mg total) by mouth 2 (two) times daily with a meal.  60 tablet  5  . metoprolol tartrate (LOPRESSOR) 25 MG tablet Take 0.5 tablets (12.5 mg total) by mouth 2 (two) times daily.  30 tablet  11  . omeprazole (PRILOSEC) 20 MG capsule Take 2 capsules (40 mg total) by mouth daily.  30 capsule  11  . pravastatin (PRAVACHOL) 20  MG tablet Take 20 mg by mouth daily.      . promethazine (PHENERGAN) 12.5 MG tablet Take 12.5 mg by mouth every 6 (six) hours as needed. For nausea      . traMADol (ULTRAM) 50 MG tablet Take 50 mg by mouth every 6 (six) hours as needed. For pain      . DISCONTD: gabapentin (NEURONTIN) 600 MG tablet Take 1 tablet (600 mg total) by mouth 3 (three) times daily.  90 tablet  3  . DISCONTD: promethazine (PHENERGAN) 12.5 MG tablet Take 1 tablet (12.5 mg total) by mouth every 8 (eight) hours as needed for nausea.  15 tablet  0   Family History  Problem Relation Age of Onset  . Hypertension Sister   . Hypertension Sister   . Hypertension Sister    History   Social History  . Marital Status: Single    Spouse Name: N/A    Number of Children: 2  . Years of Education: 12th grade   Occupational History  . 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    Social History Main Topics  . Smoking status: Former Smoker -- 0.3 packs/day for 7 years    Types: Cigarettes    Quit date: 06/27/2006  . Smokeless tobacco: Never Used  . Alcohol Use: No  . Drug Use: No  . Sexually Active: Not Currently   Other Topics Concern  . None   Social History Narrative   Lives with her kids, 28yo daughter and 10 yo adopted son.   Review of Systems: Review of Systems:  Constitutional:  Denies fever, chills, diaphoresis, appetite change and fatigue.   HEENT:  Denies congestion, sore throat, rhinorrhea, sneezing, mouth sores, trouble swallowing, neck pain   Respiratory:  Denies SO/orthopnea/PND,  cough, and wheezing. Positive for DOE  Cardiovascular:  Denies palpitations and leg swelling.   Gastrointestinal:  Denies nausea, vomiting, abdominal pain, diarrhea, constipation, blood in stool and abdominal distention.   Genitourinary:  Denies dysuria, urgency, frequency, hematuria, flank pain and difficulty urinating.   Musculoskeletal:  Denies myalgias, back  pain, joint swelling, arthralgias and gait problem.   Skin:  Denies pallor, rash and wound.   Neurological:  Denies dizziness, seizures, syncope, weakness, light-headedness, numbness and headaches.    .   Objective:  Physical Exam: Filed Vitals:   01/30/12 0839  BP: 139/77  Pulse: 106  Temp: 97.1 F (36.2 C)  TempSrc: Oral  Height: 5\' 6"  (1.676 m)  Weight: 261 lb 8 oz (118.616 kg)   General: Morbid obesity, NAD Head: normocephalic and atraumatic.  Eyes: vision grossly intact, pupils equal, pupils round, pupils reactive to light,  no injection and anicteric.  Mouth: pharynx pink and moist, no erythema, and no exudates.  Neck: supple, full ROM, no thyromegaly, unable to assess JVD due to body habitus, and no carotid bruits.  Lungs: normal respiratory effort, no accessory muscle use, normal breath sounds, no crackles, and no wheezes. Heart: normal rate, regular rhythm, no murmur, no gallop, and no rub.  Abdomen: soft, non-tender, normal bowel sounds, no distention, no guarding, no rebound tenderness, no hepatomegaly, and no splenomegaly.  Msk: no joint swelling, no joint warmth, and no redness over joints.  Pulses: 2+ DP/PT pulses bilaterally Extremities: No cyanosis, clubbing. B/L 2+ pitting edema just below the knees. Neurologic: alert & oriented X3, cranial nerves II-XII intact, strength normal in all extremities, sensation intact to light touch, and gait normal.  Skin: turgor normal and no rashes.  Psych: Oriented X3, memory intact for recent and remote, normally interactive, good eye contact, not anxious appearing, and not depressed appearing.   Assessment & Plan:

## 2012-01-30 NOTE — Patient Instructions (Addendum)
Your physician recommends that you schedule a follow-up appointment in:   3 MONTHS WITH DR Excell Seltzer  Your physician recommends that you continue on your current medications as directed. Please refer to the Current Medication list given to you today.

## 2012-01-30 NOTE — Assessment & Plan Note (Signed)
Mild nonischemic DCM  Beta Blocker and ACE just started in hospital.  F/U echo in 6 months  F/U [place] in 3 months

## 2012-01-30 NOTE — Assessment & Plan Note (Signed)
Discussed low carb diet.  Target hemoglobin A1c is 6.5 or less.  Continue current medications. Discussed [place] type diet.  To see nutritionist

## 2012-02-01 ENCOUNTER — Other Ambulatory Visit: Payer: Self-pay | Admitting: Internal Medicine

## 2012-02-01 DIAGNOSIS — I5020 Unspecified systolic (congestive) heart failure: Secondary | ICD-10-CM

## 2012-02-03 ENCOUNTER — Other Ambulatory Visit (INDEPENDENT_AMBULATORY_CARE_PROVIDER_SITE_OTHER): Payer: Medicare Other

## 2012-02-03 DIAGNOSIS — I5020 Unspecified systolic (congestive) heart failure: Secondary | ICD-10-CM

## 2012-02-03 LAB — BASIC METABOLIC PANEL WITH GFR
GFR, Est African American: 56 mL/min — ABNORMAL LOW
GFR, Est Non African American: 48 mL/min — ABNORMAL LOW
Potassium: 4.9 mEq/L (ref 3.5–5.3)
Sodium: 138 mEq/L (ref 135–145)

## 2012-02-06 ENCOUNTER — Other Ambulatory Visit: Payer: Self-pay | Admitting: Internal Medicine

## 2012-02-06 DIAGNOSIS — I5020 Unspecified systolic (congestive) heart failure: Secondary | ICD-10-CM

## 2012-02-07 ENCOUNTER — Encounter: Payer: Self-pay | Admitting: Internal Medicine

## 2012-02-16 ENCOUNTER — Encounter: Payer: Self-pay | Admitting: Internal Medicine

## 2012-02-16 ENCOUNTER — Ambulatory Visit (INDEPENDENT_AMBULATORY_CARE_PROVIDER_SITE_OTHER): Payer: Medicare Other | Admitting: Internal Medicine

## 2012-02-16 ENCOUNTER — Telehealth: Payer: Self-pay | Admitting: *Deleted

## 2012-02-16 VITALS — BP 106/65 | HR 115 | Temp 98.1°F | Ht 66.0 in | Wt 258.4 lb

## 2012-02-16 DIAGNOSIS — M7581 Other shoulder lesions, right shoulder: Secondary | ICD-10-CM

## 2012-02-16 DIAGNOSIS — M67919 Unspecified disorder of synovium and tendon, unspecified shoulder: Secondary | ICD-10-CM

## 2012-02-16 DIAGNOSIS — M719 Bursopathy, unspecified: Secondary | ICD-10-CM

## 2012-02-16 DIAGNOSIS — E119 Type 2 diabetes mellitus without complications: Secondary | ICD-10-CM

## 2012-02-16 LAB — GLUCOSE, CAPILLARY

## 2012-02-16 MED ORDER — NAPROXEN 500 MG PO TABS: 500.0000 mg | ORAL_TABLET | Freq: Three times a day (TID) | ORAL | Status: DC

## 2012-02-16 NOTE — Progress Notes (Addendum)
Subjective:   Patient ID: [name] female   DOB: 1960-09-16 51 y.o.   MRN: [id number]  HPI: [name] is a 51 y.o. African American woman with a past medical history of diabetes and chronic pain comes in for an acute visit for right shoulder pain. She states that the pain started 2 days ago it's worse at night she has tried needed Tylenol once with little success and discontinued that. She does take tramadol and gabapentin for other underlying pain and that hasn't seemed to relieve the pain at all. She doesn't remember any recent trauma to that shoulder and remembers having adhesive capsulitis in the shoulder where she received an injection on 11/12 but this pain feels "different." She does say that the shoulder tenderness radiates to the right side of her neck and down into the mid arm region. She does have some limited range of motion of her neck and right arm secondary to pain. Otherwise she has denied any headaches, popping sound or sensation, blurry vision/change in her vision, or fevers/chills. She denied any smoking or alcohol use at this time. But does note that her sugars may have been running a little elevated over the past couple of days.   Past Medical History  Diagnosis Date  . Hypertension   . Hyperlipidemia   . GERD (gastroesophageal reflux disease)   . Adhesive capsulitis of shoulder     bilateral, Steroid injection Dr. Nedra Hai 1/12 bilaterally  . Peripheral neuropathy   . Adhesive capsulitis of right shoulder     Steroid injection Dr. Nedra Hai 1/12  . Adhesive capsulitis of left shoulder     Steroid injection Dr. Nedra Hai 1/12  . Obesity   . Diabetic retinopathy   . Glaucoma   . Diabetes mellitus     Type II, insulin dependent  . CAD (coronary artery disease)     nonobstructive. Last cardiac cath (2008) showing left circumflex with mid 50% stenosis and distal luminal irregularities. Also with RCA with mid to distal 30-40% stenosis. // Previously evaluated by [facility]  Cardiology, never followed up outpatient.   Current Outpatient Prescriptions  Medication Sig Dispense Refill  . amitriptyline (ELAVIL) 25 MG tablet Take 25 mg by mouth at bedtime.      Marland Kitchen aspirin 81 MG tablet Take 81 mg by mouth daily.      . cyclobenzaprine (FLEXERIL) 10 MG tablet Take 10 mg by mouth every 8 (eight) hours as needed. For muscle spasms      . dorzolamide-timolol (COSOPT) 22.3-6.8 MG/ML ophthalmic solution Place 1 drop into the right eye 2 (two) times daily.      . ferrous sulfate 325 (65 FE) MG tablet Take 325 mg by mouth daily with breakfast.      . furosemide (LASIX) 20 MG tablet Take 20 mg by mouth daily.      Marland Kitchen gabapentin (NEURONTIN) 600 MG tablet Take 600 mg by mouth 2 (two) times daily.      . insulin aspart (NOVOLOG) 100 UNIT/ML injection Inject 3-6 Units into the skin 3 (three) times daily before meals. Per sliding scale       . insulin glargine (LANTUS) 100 UNIT/ML injection Inject 40 Units into the skin at bedtime.  10 mL  12  . lisinopril (PRINIVIL,ZESTRIL) 20 MG tablet Take 0.5 tablets (10 mg total) by mouth daily.  30 tablet  1  . metFORMIN (GLUCOPHAGE) 1000 MG tablet Take 1 tablet (1,000 mg total) by mouth 2 (two) times daily with a meal.  60 tablet  5  . metoprolol tartrate (LOPRESSOR) 25 MG tablet Take 0.5 tablets (12.5 mg total) by mouth 2 (two) times daily.  30 tablet  11  . naproxen (NAPROSYN) 500 MG tablet Take 1 tablet (500 mg total) by mouth 3 (three) times daily with meals.  60 tablet  2  . omeprazole (PRILOSEC) 20 MG capsule Take 2 capsules (40 mg total) by mouth daily.  30 capsule  11  . pravastatin (PRAVACHOL) 20 MG tablet Take 20 mg by mouth daily.      . promethazine (PHENERGAN) 12.5 MG tablet Take 12.5 mg by mouth every 6 (six) hours as needed. For nausea      . traMADol (ULTRAM) 50 MG tablet Take 50 mg by mouth every 6 (six) hours as needed. For pain      . DISCONTD: gabapentin (NEURONTIN) 600 MG tablet Take 1 tablet (600 mg total) by mouth 3 (three)  times daily.  90 tablet  3  . DISCONTD: promethazine (PHENERGAN) 12.5 MG tablet Take 1 tablet (12.5 mg total) by mouth every 8 (eight) hours as needed for nausea.  15 tablet  0   Family History  Problem Relation Age of Onset  . Hypertension Sister   . Hypertension Sister   . Hypertension Sister    History   Social History  . Marital Status: Single    Spouse Name: N/A    Number of Children: 2  . Years of Education: 12th grade   Occupational History  . 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    Social History Main Topics  . Smoking status: Former Smoker -- 0.3 packs/day for 7 years    Types: Cigarettes    Quit date: 06/27/2006  . Smokeless tobacco: Never Used  . Alcohol Use: No  . Drug Use: No  . Sexually Active: Not Currently   Other Topics Concern  . None   Social History Narrative   Lives with her kids, 28yo daughter and 18 yo adopted son.   Review of Systems: Pertinent listed in history of present illness  Objective:  Physical Exam: Filed Vitals:   02/16/12 1442  BP: 106/65  Pulse: 115  Temp: 98.1 F (36.7 C)  TempSrc: Oral  Height: 5\' 6"  (1.676 m)  Weight: 258 lb 6.4 oz (117.209 kg)  SpO2: 97%   General: NAD, well nourished HEENT: PERRL, EOMI, no scleral icterus, limited neck flexion with normal neck extension, no cervical spine tenderness Cardiac: RRR, no rubs, murmurs or gallops Pulm: clear to auscultation bilaterally, moving normal volumes of air Abd: soft, nontender, nondistended, BS present Ext: warm and well perfused, no pedal edema MS: no muscle wasting, trapezius tenderness, no thoracic spine tenderness or paraspinal muscle tightness, positive Apley scratch test, positive Hawkins, positive supraspinatus impingement Neuro: alert and oriented X3, cranial nerves II-XII grossly intact  Assessment & Plan:  1. rotator cuff tendinitis: patient reports that right shoulder and experienced  pain for the past 2 days without any recent trauma. She said the pain is worse at night and the gabapentin and tramadol that she takes for other underlying pain is not helping. On physical exam showed positive Apley scratch test, positive Hawkins and positive beer can sign.  - Patient given a short course of naproxen 500 mg 3 times a day for 7 days -Patient's creatinine function seems to be trending upwards and a repeat beam that was scheduled after short course of  naproxen -Patient was told to followup in about 2 weeks for the blood tests and reevaluate the shoulder for potential physical therapy  2. Diabetes: Patient has reported some elevation in sugars in the 200s and has not made a regular appointment with her PCP to discuss her uncontrolled diabetes. -Patient was educated on better diabetes management in terms of lifestyle modifications -A urine microalbumin was obtained since her last one was back in 2011 -Patient was told to followup in about 2 weeks to discuss diabetes management and to bring her glucometer at that visit  Patient was seen and discussed with Dr. Wayne Sever

## 2012-02-16 NOTE — Telephone Encounter (Signed)
Pt call with c/o rt shoulder pain and neck pain. Onset of pain Monday.  She has tried taking a muscle relaxer without relief. No know injury.  Appointment given for evaluation today.

## 2012-02-16 NOTE — Patient Instructions (Addendum)
It was nice to see you in clinic today, sorry your shoulder has been hurting.   Please take the naproxen 500mg  up to 3 times a day for your pain. And if your shoulder pain doesn't get better in 7-10 days come back to be seen and we will consider physical therapy.   We also scheduled you to look at your kidney's again in about 2 weeks. At that time it would be a good idea to talk to your Primary care doctor or someone about better management of your diabetes.   Thank you and hope you feel better soon. Please don't hesitate to call the clinic with any questions.

## 2012-02-17 ENCOUNTER — Telehealth: Payer: Self-pay | Admitting: Internal Medicine

## 2012-02-17 ENCOUNTER — Telehealth: Payer: Self-pay | Admitting: *Deleted

## 2012-02-17 LAB — URINALYSIS, MICROSCOPIC ONLY: Casts: NONE SEEN

## 2012-02-17 LAB — URINALYSIS, ROUTINE W REFLEX MICROSCOPIC
Bilirubin Urine: NEGATIVE
Glucose, UA: >1000 mg/dL — AB
Hgb urine dipstick: NEGATIVE
Protein, ur: NEGATIVE mg/dL
pH: 5.5 (ref 5.0–8.0)

## 2012-02-17 LAB — MICROALBUMIN / CREATININE URINE RATIO: Microalb, Ur: 0.5 mg/dL (ref 0.00–1.89)

## 2012-02-17 NOTE — Telephone Encounter (Signed)
Pt would like for you to call her to discuss the results from her office visit at [phone]

## 2012-02-17 NOTE — Addendum Note (Signed)
Addended by: Carolan Clines on: 02/17/2012 02:44 PM   Modules accepted: Level of Service

## 2012-02-17 NOTE — Progress Notes (Signed)
Patient ID: [name], female   DOB: Aug 12, 1960, 51 y.o.   MRN: [id number] INTERNAL MEDICINE TEACHING ATTENDING ADDENDUM Lonzo Cloud, MD: I personally saw and evaluated Mr.Spang in this clinic visit in conjunction with the resident, Dr.Sadek. I have discussed patient's plan of care with medical resident during this visit. I have confirmed the physical exam findings and have read and agree with the clinic note including the plan.

## 2012-02-17 NOTE — Telephone Encounter (Signed)
This is 2nd attempt to contact pt was unable to leave message first time. Pt was not available and left message stating results of UA and urine microalbumin. Pt was advised to keep future appt to manage diabetes better. And hoped pts shoulder was feeling better. Pt was told to call the clinic if had any repeat questions or concerns.

## 2012-02-17 NOTE — Progress Notes (Signed)
INTERNAL MEDICINE TEACHING ATTENDING ADDENDUM Lonzo Cloud, MD: I personally saw and evaluated Ms.Nierman  in this clinic visit in conjunction with the resident, Dr.Sadek. I have discussed patient's plan of care with medical resident during this visit. I have confirmed the physical exam findings and have read and agree with the clinic note including the plan.

## 2012-03-05 ENCOUNTER — Ambulatory Visit (INDEPENDENT_AMBULATORY_CARE_PROVIDER_SITE_OTHER): Payer: Medicare Other | Admitting: Internal Medicine

## 2012-03-05 ENCOUNTER — Encounter: Payer: Self-pay | Admitting: Internal Medicine

## 2012-03-05 VITALS — BP 131/78 | HR 100 | Temp 97.9°F | Ht 66.0 in | Wt 257.8 lb

## 2012-03-05 DIAGNOSIS — E1165 Type 2 diabetes mellitus with hyperglycemia: Secondary | ICD-10-CM

## 2012-03-05 DIAGNOSIS — K219 Gastro-esophageal reflux disease without esophagitis: Secondary | ICD-10-CM

## 2012-03-05 DIAGNOSIS — M67919 Unspecified disorder of synovium and tendon, unspecified shoulder: Secondary | ICD-10-CM

## 2012-03-05 DIAGNOSIS — Z23 Encounter for immunization: Secondary | ICD-10-CM

## 2012-03-05 DIAGNOSIS — M7500 Adhesive capsulitis of unspecified shoulder: Secondary | ICD-10-CM

## 2012-03-05 DIAGNOSIS — M25819 Other specified joint disorders, unspecified shoulder: Secondary | ICD-10-CM

## 2012-03-05 DIAGNOSIS — M7501 Adhesive capsulitis of right shoulder: Secondary | ICD-10-CM

## 2012-03-05 DIAGNOSIS — I10 Essential (primary) hypertension: Secondary | ICD-10-CM

## 2012-03-05 DIAGNOSIS — M7581 Other shoulder lesions, right shoulder: Secondary | ICD-10-CM

## 2012-03-05 DIAGNOSIS — R110 Nausea: Secondary | ICD-10-CM

## 2012-03-05 DIAGNOSIS — E1149 Type 2 diabetes mellitus with other diabetic neurological complication: Secondary | ICD-10-CM

## 2012-03-05 DIAGNOSIS — M7541 Impingement syndrome of right shoulder: Secondary | ICD-10-CM | POA: Insufficient documentation

## 2012-03-05 DIAGNOSIS — E1142 Type 2 diabetes mellitus with diabetic polyneuropathy: Secondary | ICD-10-CM

## 2012-03-05 DIAGNOSIS — M7989 Other specified soft tissue disorders: Secondary | ICD-10-CM

## 2012-03-05 DIAGNOSIS — M719 Bursopathy, unspecified: Secondary | ICD-10-CM

## 2012-03-05 DIAGNOSIS — E1140 Type 2 diabetes mellitus with diabetic neuropathy, unspecified: Secondary | ICD-10-CM

## 2012-03-05 DIAGNOSIS — E785 Hyperlipidemia, unspecified: Secondary | ICD-10-CM

## 2012-03-05 DIAGNOSIS — I5020 Unspecified systolic (congestive) heart failure: Secondary | ICD-10-CM

## 2012-03-05 DIAGNOSIS — Z0000 Encounter for general adult medical examination without abnormal findings: Secondary | ICD-10-CM

## 2012-03-05 LAB — GLUCOSE, CAPILLARY: Glucose-Capillary: 152 mg/dL — ABNORMAL HIGH (ref 70–99)

## 2012-03-05 MED ORDER — PROMETHAZINE HCL 12.5 MG PO TABS: 12.5000 mg | ORAL_TABLET | Freq: Four times a day (QID) | ORAL | Status: DC | PRN

## 2012-03-05 MED ORDER — METOPROLOL TARTRATE 25 MG PO TABS: 12.5000 mg | ORAL_TABLET | Freq: Two times a day (BID) | ORAL | Status: DC

## 2012-03-05 NOTE — Patient Instructions (Addendum)
Physical Therapy will be scheduled to help rehabilitate your right shoulder impingement. We will recheck your blood work today to assess your kidneys. I have refilled your metoprolol and phenergan. Follow-up with me in 4 months.

## 2012-03-05 NOTE — Assessment & Plan Note (Signed)
Referral for Physical Therapy.  Continue prn Naprosyn and Tramadol.

## 2012-03-05 NOTE — Assessment & Plan Note (Signed)
131/78, 100 bpm, ran out of metoprolol.  Will continue current regimen of metoprolol 25 mg bid, lisinopril 20 mg qd and Lasix 20 mg qd.  Refill completed.

## 2012-03-05 NOTE — Assessment & Plan Note (Signed)
Will defer to Dr. Eden Emms but likely will need additional agent or transition from metoprolol to carvedilol.

## 2012-03-05 NOTE — Assessment & Plan Note (Addendum)
Last HgbA1c ~9. Proteinuria in last dipstick.  Urine microalbumin: creatine 4.6 mg/g (normal range).  Didn't bring glucometer as advised.  Will continue with Lantis 40 Units qhs, meal coverage and metformin 1000 bid and reassess at next f/u. Recheck on creatinine  Today given recent increase to 1.27.

## 2012-03-05 NOTE — Assessment & Plan Note (Signed)
Received flu shot today. 

## 2012-03-05 NOTE — Progress Notes (Signed)
  Subjective:    Patient ID: [name], female    DOB: 1961-03-04, 51 y.o.   MRN: [id number]  HPI  Hx of poorly controlled DM, htn, systolic CHF and shoulder pain. Returns for f/u rotator cuff tendinitis.  Has history of adhesive capsulitis of the right shoulder status post injection. She reports that Naproxyn 500 mg tid her pain. Is interested in trying physical therapy as she reports that physical therapy helped with her left shoulder in the past. Her right shoulder range of motion today demonstrates positive Hawkins test with tenderness at approximately 70 on internal rotation of the right shoulder and with negative Neer test for passive painful arc maneuver/passive flexion at glenohumeral joint. She did not bring her Glucometer today but last HgbA1c ~9  indicating poor DM control. She notes she has started walking more and adhering to her glycemic regimen of Lantus 40U at bedtime, 3-6 units of NovoLog with meals and metformin 1000 mg twice a day. At last visit, workup for proteinuria demonstrated urine microalbumin to creatinine ratio was within normal limits but this is on ACEI therapy thus may be skewed. Denies increase in baseline chest pain which is followed by Dr. Eden Emms of a [facility]. Her bp is near goal today 131/78 and 100 bpm. States she ran out of her metoprolol. Her LE is stable on Lasix 20 mg daily.  Denies shortness of breath, cough, palpitations or lightheadedness.    Review of Systems her history of present illness otherwise negative     Objective:   Physical Exam  Constitutional: She is oriented to person, place, and time. She appears well-developed and well-nourished. No distress.  HENT:  Head: Normocephalic and atraumatic.  Eyes: Conjunctivae and EOM are normal. Pupils are equal, round, and reactive to light.  Neck: Normal range of motion. Neck supple.  Cardiovascular: Normal rate, regular rhythm and normal heart sounds.   Pulmonary/Chest: Effort normal and  breath sounds normal.  Abdominal: Soft. Bowel sounds are normal.  Musculoskeletal: She exhibits edema.       Right shoulder: She exhibits decreased range of motion and tenderness. She exhibits no swelling.       Arms:      1-2+ pitting edema bilaterally  Neurological: She is alert and oriented to person, place, and time.  Skin: Skin is warm and dry.  Psychiatric: She has a normal mood and affect.          Assessment & Plan:  See problem list

## 2012-03-06 LAB — BASIC METABOLIC PANEL
BUN: 17 mg/dL (ref 6–23)
CO2: 27 mEq/L (ref 19–32)
Calcium: 9.7 mg/dL (ref 8.4–10.5)
Creat: 0.91 mg/dL (ref 0.50–1.10)

## 2012-03-14 ENCOUNTER — Ambulatory Visit: Payer: Medicare Other

## 2012-03-30 NOTE — Addendum Note (Signed)
Addended by: Bufford Spikes on: 03/30/2012 12:13 PM   Modules accepted: Orders

## 2012-04-03 NOTE — Addendum Note (Signed)
Addended by: Neomia Dear on: 04/03/2012 05:21 PM   Modules accepted: Orders

## 2012-04-25 ENCOUNTER — Encounter: Payer: Self-pay | Admitting: Internal Medicine

## 2012-04-25 NOTE — Progress Notes (Signed)
  This patient is a CHRONIC NO-SHOW PATIENT that has a history of HYPERTENSION.  Please make sure to address hypertension during her next clinic appointment, and intervene as appropriate.    Within the AVS, please incorporate the following smartphrase: .htntips   Pertinent Data: BP Readings from Last 3 Encounters:  03/05/12 131/78  02/16/12 106/65  01/30/12 119/78    BMI: Estimated Body mass index is 41.61 kg/(m^2) as calculated from the following:   Height as of 03/05/12: 5\' 6" (1.676 m).   Weight as of 03/05/12: 257 lb 12.8 oz(116.937 kg).  Smoking History: History  Smoking status  . Former Smoker -- 0.3 packs/day for 7 years  . Types: Cigarettes  . Quit date: 06/27/2006  Smokeless tobacco  . Never Used    Last Basic Metabolic Panel:    Component Value Date/Time   NA 139 03/05/2012 1513   K 4.2 03/05/2012 1513   CL 103 03/05/2012 1513   CO2 27 03/05/2012 1513   BUN 17 03/05/2012 1513   CREATININE 0.91 03/05/2012 1513   CREATININE 0.94 01/24/2012 1131   GLUCOSE 145* 03/05/2012 1513   CALCIUM 9.7 03/05/2012 1513    Allergies: No Known Allergies

## 2012-04-26 ENCOUNTER — Other Ambulatory Visit: Payer: Self-pay | Admitting: *Deleted

## 2012-04-26 MED ORDER — PRAVASTATIN SODIUM 20 MG PO TABS: 20.0000 mg | ORAL_TABLET | Freq: Every day | ORAL | Status: DC

## 2012-04-27 ENCOUNTER — Other Ambulatory Visit (HOSPITAL_COMMUNITY): Payer: Self-pay | Admitting: Radiation Oncology

## 2012-04-27 ENCOUNTER — Other Ambulatory Visit: Payer: Self-pay | Admitting: *Deleted

## 2012-04-28 ENCOUNTER — Other Ambulatory Visit: Payer: Self-pay | Admitting: Internal Medicine

## 2012-04-28 MED ORDER — GABAPENTIN 600 MG PO TABS: 600.0000 mg | ORAL_TABLET | Freq: Three times a day (TID) | ORAL | Status: DC

## 2012-05-07 ENCOUNTER — Encounter: Payer: Medicare Other | Admitting: Internal Medicine

## 2012-05-08 ENCOUNTER — Encounter: Payer: Self-pay | Admitting: Cardiovascular Disease

## 2012-05-08 ENCOUNTER — Ambulatory Visit (INDEPENDENT_AMBULATORY_CARE_PROVIDER_SITE_OTHER): Payer: Medicare Other | Admitting: Cardiovascular Disease

## 2012-05-08 VITALS — BP 132/80 | HR 103 | Ht 66.0 in | Wt 265.0 lb

## 2012-05-08 DIAGNOSIS — I2510 Atherosclerotic heart disease of native coronary artery without angina pectoris: Secondary | ICD-10-CM

## 2012-05-08 DIAGNOSIS — I10 Essential (primary) hypertension: Secondary | ICD-10-CM

## 2012-05-08 MED ORDER — CARVEDILOL 6.25 MG PO TABS: 6.2500 mg | ORAL_TABLET | Freq: Two times a day (BID) | ORAL | Status: DC

## 2012-05-08 MED ORDER — FUROSEMIDE 40 MG PO TABS: 40.0000 mg | ORAL_TABLET | Freq: Every day | ORAL | Status: DC

## 2012-05-08 NOTE — Patient Instructions (Signed)
Your physician has recommended you make the following change in your medication: STOP Metoprolol Tartrate, START Carvedilol 6.25mg  take one by mouth twice a day, INCREASE Furosemide 40mg  take one by mouth daily  Your physician wants you to follow-up in: 6 MONTHS with Tereso Newcomer PA-C. You will receive a reminder letter in the mail two months in advance. If you don't receive a letter, please call our office to schedule the follow-up appointment.  Your physician wants you to follow-up in: 1 YEAR with Dr Excell Seltzer.  You will receive a reminder letter in the mail two months in advance. If you don't receive a letter, please call our office to schedule the follow-up appointment.

## 2012-05-08 NOTE — Progress Notes (Signed)
HPI:  51 year old woman presenting for followup evaluation. The patient has nonobstructive coronary artery disease, and nonischemic cardiomyopathy with left ventricular ejection fraction 35-40%. She was hospitalized within the past 3 months and underwent cardiac catheterization.  The patient is doing fairly well at present. She denies any changes in her chronic exertional dyspnea. She has mild leg swelling is unchanged. She denies orthopnea or PND. She has been compliant with her medications.  Outpatient Encounter Prescriptions as of 05/08/2012  Medication Sig Dispense Refill  . amitriptyline (ELAVIL) 25 MG tablet Take 25 mg by mouth at bedtime.      Marland Kitchen aspirin 81 MG tablet Take 81 mg by mouth daily.      . cyclobenzaprine (FLEXERIL) 10 MG tablet Take 10 mg by mouth every 8 (eight) hours as needed. For muscle spasms      . dorzolamide-timolol (COSOPT) 22.3-6.8 MG/ML ophthalmic solution Place 1 drop into the right eye 2 (two) times daily.      . ferrous sulfate 325 (65 FE) MG tablet Take 325 mg by mouth daily with breakfast.      . furosemide (LASIX) 20 MG tablet Take 20 mg by mouth daily.      Marland Kitchen gabapentin (NEURONTIN) 600 MG tablet Take 1 tablet (600 mg total) by mouth 3 (three) times daily.  90 tablet  6  . insulin aspart (NOVOLOG) 100 UNIT/ML injection Inject 3-6 Units into the skin 3 (three) times daily before meals. Per sliding scale       . insulin glargine (LANTUS) 100 UNIT/ML injection Inject 40 Units into the skin at bedtime.  10 mL  12  . lisinopril (PRINIVIL,ZESTRIL) 20 MG tablet TAKE HALF TABLET BY MOUTH DAILY  30 tablet  0  . metFORMIN (GLUCOPHAGE) 1000 MG tablet Take 1 tablet (1,000 mg total) by mouth 2 (two) times daily with a meal.  60 tablet  5  . metoprolol tartrate (LOPRESSOR) 25 MG tablet Take 0.5 tablets (12.5 mg total) by mouth 2 (two) times daily.  30 tablet  11  . naproxen (NAPROSYN) 500 MG tablet Take 1 tablet (500 mg total) by mouth 3 (three) times daily with meals.  60  tablet  2  . omeprazole (PRILOSEC) 20 MG capsule Take 2 capsules (40 mg total) by mouth daily.  30 capsule  11  . pravastatin (PRAVACHOL) 20 MG tablet Take 1 tablet (20 mg total) by mouth daily.  30 tablet  6  . promethazine (PHENERGAN) 12.5 MG tablet Take 1 tablet (12.5 mg total) by mouth every 6 (six) hours as needed. For nausea  30 tablet  0  . traMADol (ULTRAM) 50 MG tablet Take 50 mg by mouth every 6 (six) hours as needed. For pain        No Known Allergies  Past Medical History  Diagnosis Date  . Hypertension   . Hyperlipidemia   . GERD (gastroesophageal reflux disease)   . Adhesive capsulitis of shoulder     bilateral, Steroid injection Dr. Nedra Hai 1/12 bilaterally  . Peripheral neuropathy   . Adhesive capsulitis of right shoulder     Steroid injection Dr. Nedra Hai 1/12  . Adhesive capsulitis of left shoulder     Steroid injection Dr. Nedra Hai 1/12  . Obesity   . Diabetic retinopathy(362.0)   . Glaucoma(365)   . Diabetes mellitus     Type II, insulin dependent  . CAD (coronary artery disease)     nonobstructive. Last cardiac cath (2008) showing left circumflex with mid 50% stenosis and  distal luminal irregularities. Also with RCA with mid to distal 30-40% stenosis. // Previously evaluated by [facility], never followed up outpatient.    ROS: Negative except as per HPI  BP 132/80  Pulse 103  Ht 5\' 6"  (1.676 m)  Wt 120.203 kg (265 lb)  BMI 42.77 kg/m2  SpO2 98%  LMP 02/13/2011  PHYSICAL EXAM: Pt is alert and oriented, pleasant obese woman in NAD HEENT: normal Neck: JVP - normal, carotids 2+= without bruits Lungs: CTA bilaterally CV:  Tachycardic and regular without murmur or gallop Abd: soft, NT, Positive BS, no hepatomegaly Ext: 1+ pretibial edema bilaterally, distal pulses intact and equal Skin: warm/dry no rash  ASSESSMENT AND PLAN: 1. Chronic systolic heart failure. The patient has nonischemic cardiomyopathy. I'm going to transition her from metoprolol to  carvedilol at a dose of 6.25 mg twice daily. Considering her leg swelling, will increase furosemide to 40 mg daily. She is on an ACE inhibitor at good dose. I would like to see her back in one year for followup and I will arrange a 6 month followup visit with one of our physician extenders.  2. Coronary artery disease, nonobstructive. Continue aspirin for antiplatelet therapy. The patient is on a statin drug.  We have reviewed the importance of dietary and lifestyle modification. She really needs to initiate an exercise program and she understands this. As above, we will see her back in 6 months.  Tonny Bollman 05/08/2012 10:34 AM

## 2012-05-17 ENCOUNTER — Other Ambulatory Visit: Payer: Self-pay | Admitting: *Deleted

## 2012-05-18 MED ORDER — PROMETHAZINE HCL 12.5 MG PO TABS: 12.5000 mg | ORAL_TABLET | Freq: Four times a day (QID) | ORAL | Status: DC | PRN

## 2012-05-18 MED ORDER — AMITRIPTYLINE HCL 25 MG PO TABS: 25.0000 mg | ORAL_TABLET | Freq: Every day | ORAL | Status: DC

## 2012-05-29 ENCOUNTER — Other Ambulatory Visit: Payer: Self-pay | Admitting: *Deleted

## 2012-05-29 MED ORDER — METFORMIN HCL 1000 MG PO TABS: 1000.0000 mg | ORAL_TABLET | Freq: Two times a day (BID) | ORAL | Status: DC

## 2012-06-09 ENCOUNTER — Emergency Department (HOSPITAL_COMMUNITY): Payer: Medicare Other

## 2012-06-09 ENCOUNTER — Encounter (HOSPITAL_COMMUNITY): Payer: Self-pay

## 2012-06-09 ENCOUNTER — Emergency Department (HOSPITAL_COMMUNITY)
Admission: EM | Admit: 2012-06-09 | Discharge: 2012-06-10 | Disposition: A | Discharge status: Home / Self Care | Payer: Medicare Other | Attending: Emergency Medicine | Admitting: Emergency Medicine

## 2012-06-09 DIAGNOSIS — R079 Chest pain, unspecified: Secondary | ICD-10-CM

## 2012-06-09 DIAGNOSIS — Z79899 Other long term (current) drug therapy: Secondary | ICD-10-CM | POA: Insufficient documentation

## 2012-06-09 DIAGNOSIS — E11319 Type 2 diabetes mellitus with unspecified diabetic retinopathy without macular edema: Secondary | ICD-10-CM | POA: Insufficient documentation

## 2012-06-09 DIAGNOSIS — I10 Essential (primary) hypertension: Secondary | ICD-10-CM | POA: Insufficient documentation

## 2012-06-09 DIAGNOSIS — Z87891 Personal history of nicotine dependence: Secondary | ICD-10-CM | POA: Insufficient documentation

## 2012-06-09 DIAGNOSIS — G909 Disorder of the autonomic nervous system, unspecified: Secondary | ICD-10-CM | POA: Insufficient documentation

## 2012-06-09 DIAGNOSIS — E669 Obesity, unspecified: Secondary | ICD-10-CM | POA: Insufficient documentation

## 2012-06-09 DIAGNOSIS — I2510 Atherosclerotic heart disease of native coronary artery without angina pectoris: Secondary | ICD-10-CM | POA: Insufficient documentation

## 2012-06-09 DIAGNOSIS — R071 Chest pain on breathing: Secondary | ICD-10-CM | POA: Insufficient documentation

## 2012-06-09 DIAGNOSIS — Z7982 Long term (current) use of aspirin: Secondary | ICD-10-CM | POA: Insufficient documentation

## 2012-06-09 DIAGNOSIS — Z794 Long term (current) use of insulin: Secondary | ICD-10-CM | POA: Insufficient documentation

## 2012-06-09 DIAGNOSIS — E785 Hyperlipidemia, unspecified: Secondary | ICD-10-CM | POA: Insufficient documentation

## 2012-06-09 DIAGNOSIS — Z8719 Personal history of other diseases of the digestive system: Secondary | ICD-10-CM | POA: Insufficient documentation

## 2012-06-09 DIAGNOSIS — E1339 Other specified diabetes mellitus with other diabetic ophthalmic complication: Secondary | ICD-10-CM | POA: Insufficient documentation

## 2012-06-09 DIAGNOSIS — Z8739 Personal history of other diseases of the musculoskeletal system and connective tissue: Secondary | ICD-10-CM | POA: Insufficient documentation

## 2012-06-09 DIAGNOSIS — J189 Pneumonia, unspecified organism: Secondary | ICD-10-CM | POA: Insufficient documentation

## 2012-06-09 DIAGNOSIS — E1149 Type 2 diabetes mellitus with other diabetic neurological complication: Secondary | ICD-10-CM | POA: Insufficient documentation

## 2012-06-09 HISTORY — DX: Heart failure, unspecified: I50.9

## 2012-06-09 LAB — CBC
MCV: 86.5 fL (ref 78.0–100.0)
Platelets: 314 10*3/uL (ref 150–400)
RBC: 3.85 MIL/uL — ABNORMAL LOW (ref 3.87–5.11)
WBC: 11.7 10*3/uL — ABNORMAL HIGH (ref 4.0–10.5)

## 2012-06-09 LAB — BASIC METABOLIC PANEL
CO2: 28 mEq/L (ref 19–32)
Chloride: 94 mEq/L — ABNORMAL LOW (ref 96–112)
Creatinine, Ser: 1 mg/dL (ref 0.50–1.10)
Potassium: 4.5 mEq/L (ref 3.5–5.1)

## 2012-06-09 LAB — POCT I-STAT TROPONIN I: Troponin i, poc: 0 ng/mL (ref 0.00–0.08)

## 2012-06-09 LAB — PRO B NATRIURETIC PEPTIDE: Pro B Natriuretic peptide (BNP): <5 pg/mL (ref 0–125)

## 2012-06-09 MED ORDER — LORAZEPAM 2 MG/ML IJ SOLN
1.0000 mg | Freq: Once | INTRAMUSCULAR | Status: AC
  Administered 2012-06-09: 2 mg via INTRAVENOUS
  Filled 2012-06-09: qty 1

## 2012-06-09 MED ORDER — NITROGLYCERIN 0.4 MG SL SUBL
0.4000 mg | SUBLINGUAL_TABLET | SUBLINGUAL | Status: DC | PRN
  Administered 2012-06-09: 0.4 mg via SUBLINGUAL
  Filled 2012-06-09: qty 25

## 2012-06-09 MED ORDER — MORPHINE SULFATE 4 MG/ML IJ SOLN
4.0000 mg | Freq: Once | INTRAMUSCULAR | Status: AC
  Administered 2012-06-09: 4 mg via INTRAVENOUS
  Filled 2012-06-09: qty 1

## 2012-06-09 MED ORDER — DEXTROSE 5 % IV SOLN
1.0000 g | Freq: Once | INTRAVENOUS | Status: AC
  Administered 2012-06-09: 1 g via INTRAVENOUS
  Filled 2012-06-09: qty 10

## 2012-06-09 MED ORDER — ASPIRIN 325 MG PO TABS
325.0000 mg | ORAL_TABLET | Freq: Once | ORAL | Status: AC
  Administered 2012-06-09: 325 mg via ORAL
  Filled 2012-06-09: qty 1

## 2012-06-09 MED ORDER — DEXTROSE 5 % IV SOLN
500.0000 mg | Freq: Once | INTRAVENOUS | Status: AC
  Administered 2012-06-09: 500 mg via INTRAVENOUS
  Filled 2012-06-09: qty 500

## 2012-06-09 NOTE — ED Notes (Signed)
Pt reports sudden onset of (L) side chest pain radiating into (L) arm and dizziness starting approx 1 hour ago, pt reports the pain started after her daughter and son got into a argument

## 2012-06-09 NOTE — ED Provider Notes (Signed)
History     CSN: [id number]  Arrival date & time 06/09/12  2000   First MD Initiated Contact with Patient 06/09/12 2042      Chief Complaint  Patient presents with  . Chest Pain    (Consider location/radiation/quality/duration/timing/severity/associated sxs/prior treatment) Patient is a 51 y.o. female presenting with chest pain. The history is provided by the patient.  Chest Pain The chest pain began 1 - 2 hours ago. Chest pain occurs constantly. The chest pain is unchanged. The pain is associated with stress. At its most intense, the pain is at 10/10. The pain is currently at 9/10. The severity of the pain is severe. The quality of the pain is described as pressure-like (catch, aching). The pain radiates to the left arm. Chest pain is worsened by stress. Pertinent negatives for primary symptoms include no fever, no syncope, no cough, no nausea and no vomiting.     Past Medical History  Diagnosis Date  . Hypertension   . Hyperlipidemia   . GERD (gastroesophageal reflux disease)   . Adhesive capsulitis of shoulder     bilateral, Steroid injection Dr. Nedra Hai 1/12 bilaterally  . Peripheral neuropathy   . Adhesive capsulitis of right shoulder     Steroid injection Dr. Nedra Hai 1/12  . Adhesive capsulitis of left shoulder     Steroid injection Dr. Nedra Hai 1/12  . Obesity   . Diabetic retinopathy(362.0)   . Glaucoma(365)   . Diabetes mellitus     Type II, insulin dependent  . CAD (coronary artery disease)     nonobstructive. Last cardiac cath (2008) showing left circumflex with mid 50% stenosis and distal luminal irregularities. Also with RCA with mid to distal 30-40% stenosis. // Previously evaluated by [facility], never followed up outpatient.    Past Surgical History  Procedure Date  . Cataract extraction   . Glaucoma surgery   . Cardiac catheterization   . Tubal ligation     Family History  Problem Relation Age of Onset  . Hypertension Sister   . Hypertension Sister    . Hypertension Sister     History  Substance Use Topics  . Smoking status: Former Smoker -- 0.3 packs/day for 7 years    Types: Cigarettes    Quit date: 06/27/2006  . Smokeless tobacco: Never Used  . Alcohol Use: No    OB History    Grav Para Term Preterm Abortions TAB SAB Ect Mult Living                  Review of Systems  Constitutional: Negative for fever.  Respiratory: Negative for cough.   Cardiovascular: Positive for chest pain. Negative for syncope.  Gastrointestinal: Negative for nausea and vomiting.  All other systems reviewed and are negative.    Allergies  Review of patient's allergies indicates no known allergies.  Home Medications   Current Outpatient Rx  Name  Route  Sig  Dispense  Refill  . AMITRIPTYLINE HCL 25 MG PO TABS   Oral   Take 1 tablet (25 mg total) by mouth at bedtime.   30 tablet   0   . ASPIRIN 81 MG PO TABS   Oral   Take 81 mg by mouth daily.         Marland Kitchen CARVEDILOL 6.25 MG PO TABS   Oral   Take 1 tablet (6.25 mg total) by mouth 2 (two) times daily.   60 tablet   11   . CYCLOBENZAPRINE HCL 10 MG PO  TABS   Oral   Take 10 mg by mouth every 8 (eight) hours as needed. For muscle spasms         . DORZOLAMIDE HCL-TIMOLOL MAL 22.3-6.8 MG/ML OP SOLN   Right Eye   Place 1 drop into the right eye 2 (two) times daily.         Marland Kitchen FERROUS SULFATE 325 (65 FE) MG PO TABS   Oral   Take 325 mg by mouth daily with breakfast.         . FUROSEMIDE 40 MG PO TABS   Oral   Take 1 tablet (40 mg total) by mouth daily.   30 tablet   11   . GABAPENTIN 600 MG PO TABS   Oral   Take 1 tablet (600 mg total) by mouth 3 (three) times daily.   90 tablet   6   . INSULIN ASPART 100 UNIT/ML [place] SOLN   Subcutaneous   Inject 3-6 Units into the skin 3 (three) times daily before meals. Per sliding scale          . INSULIN GLARGINE 100 UNIT/ML [place] SOLN   Subcutaneous   Inject 40 Units into the skin at bedtime.   10 mL   12   . LISINOPRIL 20  MG PO TABS      TAKE HALF TABLET BY MOUTH DAILY   30 tablet   0   . METFORMIN HCL 1000 MG PO TABS   Oral   Take 1 tablet (1,000 mg total) by mouth 2 (two) times daily with a meal.   60 tablet   5   . NAPROXEN 500 MG PO TABS   Oral   Take 1 tablet (500 mg total) by mouth 3 (three) times daily with meals.   60 tablet   2   . OMEPRAZOLE 20 MG PO CPDR   Oral   Take 2 capsules (40 mg total) by mouth daily.   30 capsule   11   . PRAVASTATIN SODIUM 20 MG PO TABS   Oral   Take 1 tablet (20 mg total) by mouth daily.   30 tablet   6   . PROMETHAZINE HCL 12.5 MG PO TABS   Oral   Take 1 tablet (12.5 mg total) by mouth every 6 (six) hours as needed. For nausea   30 tablet   0   . TRAMADOL HCL 50 MG PO TABS   Oral   Take 50 mg by mouth every 6 (six) hours as needed. For pain           BP 177/137  Pulse 107  Temp 98 F (36.7 C) (Oral)  Resp 20  SpO2 98%  LMP 02/13/2011  Physical Exam  Nursing note and vitals reviewed. Constitutional: She is oriented to person, place, and time. She appears well-developed and well-nourished. No distress.  HENT:  Head: Normocephalic and atraumatic.  Eyes: EOM are normal. Pupils are equal, round, and reactive to light.  Neck: Normal range of motion. Neck supple.  Cardiovascular: Normal rate and regular rhythm.  Exam reveals no friction rub.   No murmur heard. Pulmonary/Chest: Effort normal and breath sounds normal. No respiratory distress. She has no wheezes. She has no rales.  Abdominal: Soft. She exhibits no distension. There is no tenderness. There is no rebound.  Musculoskeletal: Normal range of motion. She exhibits no edema.  Neurological: She is alert and oriented to person, place, and time.  Skin: She is not diaphoretic.  ED Course  Procedures (including critical care time)  Labs Reviewed  CBC - Abnormal; Notable for the following:    WBC 11.7 (*)     RBC 3.85 (*)     Hemoglobin 10.9 (*)     HCT 33.3 (*)     All  other components within normal limits  POCT I-STAT TROPONIN I  BASIC METABOLIC PANEL   Dg Chest 2 View  06/09/2012  *RADIOLOGY REPORT*  Clinical Data: Mid to left-sided chest pain with shortness of breath.  CHEST - 2 VIEW  Comparison: 01/22/2012  Findings: Lungs are hypoinflated with minimal opacification of the posterior base on the lateral film as this may be due to pulmonary venous confluence although cannot exclude atelectasis / infection. The cardiomediastinal silhouette and remainder of the exam is unchanged.  IMPRESSION: Mild opacification of the posterior lung base on the lateral film as cannot exclude infection/atelectasis.   Original Report Authenticated By: Elberta Fortis, M.D.      1. Pneumonia   2. Chest pain      Date: 06/10/2012  Rate: 109  Rhythm: sinus tachycardia  QRS Axis: normal  Intervals: normal  ST/T Wave abnormalities: nonspecific T wave changes  Conduction Disutrbances:none  Narrative Interpretation:   Old EKG Reviewed: unchanged    MDM   , CHF who presents with chest pain. Began during his treatment and that her sign was having with her daughter. Pain has been constant since then and is described as a left sharp chest and raise her left arm. Patient's last cath was earlier this summer that showed some mild to moderate disease and did not require attention. Patient states 10 out of 10 pain, however slicing comfortably in the bed and does not appear to be in any acute distress. Her exam is benign. Her EKG is normal without any changes from previous. Initial troponin is normal. Chest x-ray shows pneumonia for which I gave her azithromycin and Rocephin. Will give her a prescription for azithromycin at home. Her second troponin is normal. I spoke with cardiology, Dr. Terressa Koyanagi, he states with the recent cath, a delta troponin is sufficient for her cardiac workup. Patient stable for discharge with azithromycin. Instructed to followup with cardiology next  week.         Elwin Mocha, MD 06/10/12 [phone]

## 2012-06-10 ENCOUNTER — Other Ambulatory Visit: Payer: Self-pay | Admitting: Internal Medicine

## 2012-06-10 LAB — POCT I-STAT TROPONIN I: Troponin i, poc: 0 ng/mL (ref 0.00–0.08)

## 2012-06-10 MED ORDER — AZITHROMYCIN 250 MG PO TABS: 250.0000 mg | ORAL_TABLET | Freq: Every day | ORAL | Status: DC

## 2012-06-12 NOTE — ED Provider Notes (Signed)
I saw and evaluated the patient, reviewed the resident's note and I agree with the findings and plan.  51 year old female with onset of chest pain 2 hours before arrival. S/P cath in summer with some CAD but not significant. Today Tn's negative x 2 and EKG without acute changes or significant new changes. From Cardiac standpoint, discussed with Cardiology and okay for discharge.   I agree with residents interpretation of her EKG.  Chest X-ray with questionable pneumonia, patient not toxic or hypoxic, this would be CAP, will Rx with Rocephin and Zithromax.    Shelda Jakes, MD 06/12/12 [phone]

## 2012-06-15 ENCOUNTER — Telehealth: Payer: Self-pay | Admitting: Dietician

## 2012-06-15 ENCOUNTER — Ambulatory Visit (INDEPENDENT_AMBULATORY_CARE_PROVIDER_SITE_OTHER): Payer: Medicare Other | Admitting: Internal Medicine

## 2012-06-15 ENCOUNTER — Encounter: Payer: Self-pay | Admitting: Internal Medicine

## 2012-06-15 VITALS — BP 126/76 | HR 96 | Temp 97.4°F | Ht 66.0 in | Wt 267.1 lb

## 2012-06-15 DIAGNOSIS — E1140 Type 2 diabetes mellitus with diabetic neuropathy, unspecified: Secondary | ICD-10-CM

## 2012-06-15 DIAGNOSIS — E1142 Type 2 diabetes mellitus with diabetic polyneuropathy: Secondary | ICD-10-CM

## 2012-06-15 DIAGNOSIS — E1165 Type 2 diabetes mellitus with hyperglycemia: Secondary | ICD-10-CM

## 2012-06-15 DIAGNOSIS — I10 Essential (primary) hypertension: Secondary | ICD-10-CM

## 2012-06-15 DIAGNOSIS — G479 Sleep disorder, unspecified: Secondary | ICD-10-CM

## 2012-06-15 DIAGNOSIS — E1149 Type 2 diabetes mellitus with other diabetic neurological complication: Secondary | ICD-10-CM

## 2012-06-15 DIAGNOSIS — D649 Anemia, unspecified: Secondary | ICD-10-CM

## 2012-06-15 DIAGNOSIS — J189 Pneumonia, unspecified organism: Secondary | ICD-10-CM

## 2012-06-15 DIAGNOSIS — G4700 Insomnia, unspecified: Secondary | ICD-10-CM | POA: Insufficient documentation

## 2012-06-15 DIAGNOSIS — I2510 Atherosclerotic heart disease of native coronary artery without angina pectoris: Secondary | ICD-10-CM

## 2012-06-15 DIAGNOSIS — M549 Dorsalgia, unspecified: Secondary | ICD-10-CM

## 2012-06-15 HISTORY — DX: Pneumonia, unspecified organism: J18.9

## 2012-06-15 MED ORDER — ASPIRIN 81 MG PO TABS: 81.0000 mg | ORAL_TABLET | Freq: Every day | ORAL | Status: AC

## 2012-06-15 MED ORDER — INSULIN GLARGINE 100 UNIT/ML ~~LOC~~ SOLN: 40.0000 [IU] | Freq: Every day | SUBCUTANEOUS | Status: DC

## 2012-06-15 MED ORDER — FERROUS SULFATE 325 (65 FE) MG PO TABS: 325.0000 mg | ORAL_TABLET | Freq: Every day | ORAL | Status: DC

## 2012-06-15 MED ORDER — TRAMADOL HCL 50 MG PO TABS: 50.0000 mg | ORAL_TABLET | Freq: Four times a day (QID) | ORAL | Status: DC | PRN

## 2012-06-15 MED ORDER — AMITRIPTYLINE HCL 25 MG PO TABS: 25.0000 mg | ORAL_TABLET | Freq: Every day | ORAL | Status: DC

## 2012-06-15 MED ORDER — LISINOPRIL 20 MG PO TABS: 10.0000 mg | ORAL_TABLET | Freq: Every day | ORAL | Status: DC

## 2012-06-15 NOTE — Telephone Encounter (Signed)
Requested foot exam records from [facility]

## 2012-06-15 NOTE — Patient Instructions (Addendum)
General Instructions:  Your lung exam was good today. You should continue to improve. Hopefully you will back to your baseline by Christmas. Increase Lantus to 44 units in the evening. Continue all of your other medications as instructed. Take the tramadol before bedtime and in the morning as needed to help with the back pain. It is a good plan to start exercising.  Look into the [employer] gym as we discussed. Follow-up with me in 3 months.  Treatment Goals:  Goals (1 Years of Data) as of 06/15/2012          As of Today 06/10/12 06/09/12 06/09/12 06/09/12     Blood Pressure    . Blood Pressure < 140/90  126/76 147/102 145/80 132/56 177/137     Result Component    . HEMOGLOBIN A1C < 7.0  8.9        . LDL CALC < 100            Progress Toward Treatment Goals:  Treatment Goal 06/15/2012  Hemoglobin A1C improved  Blood pressure at goal    Self Care Goals & Plans:  Self Care Goal 06/15/2012  Manage my medications take my medicines as prescribed; bring my medications to every visit; refill my medications on time  Monitor my health keep track of my blood glucose; bring my glucose meter and log to each visit  Eat healthy foods drink diet soda or water instead of juice or soda; eat more vegetables; eat foods that are low in salt; eat baked foods instead of fried foods  Be physically active find an activity I enjoy       Care Management & Community Referrals: Exercise program

## 2012-06-15 NOTE — Assessment & Plan Note (Signed)
BP Readings from Last 3 Encounters:  06/15/12 126/76  06/10/12 147/102  05/08/12 132/80    Lab Results  Component Value Date   NA 133* 06/09/2012   K 4.5 06/09/2012   CREATININE 1.00 06/09/2012    Assessment:  Blood pressure control: controlled  Progress toward BP goal:  at goal  Comments:   Plan:  Medications:  continue current medications  Educational resources provided: brochure  Self management tools provided: home blood pressure logbook  Other plans: cont carvedilol, lasix, and lisinopril

## 2012-06-15 NOTE — Progress Notes (Signed)
  Subjective:    Patient ID: [name], female    DOB: 07-03-1960, 51 y.o.   MRN: [id number]  HPI  She presents for post ED visit followup for likely community-acquired pneumonia. She reports that her symptoms of shortness of breath, cough, and chest pain have improved. She reports finishing be a course of antibiotics. This included dose of Rocephin in the ED and 5 days of azithromycin. Is requesting refills of several medications including tramadol for her chronic back pain. Her history is significant for diabetes uncontrolled with neuropathy, hypertension, coronary artery disease and systolic CHF followed by Dr. [facility] Cardiology and obesity  Review of Systems As per HPI otherwise negative     Objective:   Physical Exam  Constitutional: She is oriented to person, place, and time. She appears well-developed and well-nourished. No distress.       AA, obese  HENT:  Head: Normocephalic and atraumatic.  Eyes: Conjunctivae normal and EOM are normal. Pupils are equal, round, and reactive to light.  Neck: Normal range of motion. Neck supple.  Cardiovascular: Normal rate, regular rhythm, normal heart sounds and intact distal pulses.        Distant heart sounds  Pulmonary/Chest: Effort normal and breath sounds normal. She has no wheezes. She has no rales.  Abdominal: Soft. Bowel sounds are normal.  Neurological: She is alert and oriented to person, place, and time.  Skin: Skin is warm and dry.  Psychiatric: She has a normal mood and affect.          Assessment & Plan:  #1 community-acquired pneumonia: Status post antibiotic therapy with improvement in symptomology, O2 sat 96% on room air  #2 hypertension: At goal with BP today 126/76 on lisinopril 10 mg daily, carvedilol 6.25 mg twice a day, Lasix 20 mg daily -Continue current therapy  #3 diabetes, type II: Uncontrolled, hemoglobin A1c 8.9 today which is minimal improvement from 9.0 five months ago patient reports lack of  exercise and motivation secondary to chronic back pain -Increase Lantus from 40 units each bedtime to 44 units each bedtime -Counseled on need for exercise, plan to look into [employer] gym -Continue metformin 1000 mg twice a day -Followup 3 months with repeat hemoglobin A1c

## 2012-06-15 NOTE — Assessment & Plan Note (Signed)
Lab Results  Component Value Date   HGBA1C 8.9 06/15/2012   HGBA1C 9.4 01/09/2012   HGBA1C 9.0 10/19/2011     Assessment:  Diabetes control: poor control (HgbA1C >9%)  Progress toward A1C goal:  improved  Comments: on Lantus 40 qhs  Plan:  Medications:  continue Lantus and Metformin  Home glucose monitoring:   Frequency:     Timing:    Instruction/counseling given: reminded to bring blood glucose meter & log to each visit and discussed the need for weight loss  Educational resources provided: brochure  Self management tools provided:    Other plans: Increase Lantus to 44 units qhs, continue Metformin 1000mg  bid with meals

## 2012-06-15 NOTE — Telephone Encounter (Signed)
Called pt about foot exam being due. She goes regularly to Dr. Girtha Rm @ [facility] does her foot exams.   Patient also requested information about exercise program for weight loss. Informed her of silver sneakers if she changes Medicare plan.  Mailed her information on same.

## 2012-06-18 ENCOUNTER — Other Ambulatory Visit: Payer: Self-pay | Admitting: *Deleted

## 2012-06-18 MED ORDER — PROMETHAZINE HCL 12.5 MG PO TABS: 12.5000 mg | ORAL_TABLET | Freq: Four times a day (QID) | ORAL | Status: DC | PRN

## 2012-06-28 ENCOUNTER — Other Ambulatory Visit: Payer: Self-pay | Admitting: Internal Medicine

## 2012-06-28 DIAGNOSIS — E119 Type 2 diabetes mellitus without complications: Secondary | ICD-10-CM

## 2012-06-28 DIAGNOSIS — G4700 Insomnia, unspecified: Secondary | ICD-10-CM

## 2012-07-03 ENCOUNTER — Other Ambulatory Visit (HOSPITAL_COMMUNITY): Payer: Self-pay | Admitting: Podiatry

## 2012-07-03 DIAGNOSIS — M79671 Pain in right foot: Secondary | ICD-10-CM

## 2012-07-09 ENCOUNTER — Encounter (HOSPITAL_COMMUNITY)
Admission: RE | Admit: 2012-07-09 | Discharge: 2012-07-09 | Disposition: A | Discharge status: Home / Self Care | Payer: Medicare Other | Source: Ambulatory Visit | Attending: Podiatry | Admitting: Podiatry

## 2012-07-09 DIAGNOSIS — M79671 Pain in right foot: Secondary | ICD-10-CM

## 2012-07-09 DIAGNOSIS — M79609 Pain in unspecified limb: Secondary | ICD-10-CM | POA: Insufficient documentation

## 2012-07-09 MED ORDER — TECHNETIUM TC 99M MEDRONATE IV KIT
25.0000 | PACK | Freq: Once | INTRAVENOUS | Status: AC | PRN
  Administered 2012-07-09: 25 via INTRAVENOUS

## 2012-07-23 ENCOUNTER — Other Ambulatory Visit: Payer: Self-pay | Admitting: Internal Medicine

## 2012-07-23 DIAGNOSIS — E119 Type 2 diabetes mellitus without complications: Secondary | ICD-10-CM

## 2012-07-23 DIAGNOSIS — M7500 Adhesive capsulitis of unspecified shoulder: Secondary | ICD-10-CM

## 2012-07-23 DIAGNOSIS — F32A Depression, unspecified: Secondary | ICD-10-CM

## 2012-07-23 DIAGNOSIS — M545 Low back pain, unspecified: Secondary | ICD-10-CM

## 2012-07-23 DIAGNOSIS — I10 Essential (primary) hypertension: Secondary | ICD-10-CM

## 2012-07-23 DIAGNOSIS — F329 Major depressive disorder, single episode, unspecified: Secondary | ICD-10-CM

## 2012-07-23 DIAGNOSIS — K219 Gastro-esophageal reflux disease without esophagitis: Secondary | ICD-10-CM

## 2012-07-23 MED ORDER — OMEPRAZOLE 20 MG PO CPDR: 20.0000 mg | DELAYED_RELEASE_CAPSULE | Freq: Every day | ORAL | Status: DC

## 2012-07-24 ENCOUNTER — Ambulatory Visit: Payer: Self-pay

## 2012-08-14 ENCOUNTER — Other Ambulatory Visit: Payer: Self-pay | Admitting: Internal Medicine

## 2012-08-15 ENCOUNTER — Other Ambulatory Visit: Payer: Self-pay | Admitting: *Deleted

## 2012-08-15 DIAGNOSIS — G4700 Insomnia, unspecified: Secondary | ICD-10-CM

## 2012-08-16 MED ORDER — AMITRIPTYLINE HCL 25 MG PO TABS: ORAL_TABLET | ORAL | Status: DC

## 2012-08-28 NOTE — ED Provider Notes (Signed)
Order(s) created erroneously. Erroneous order ID: [id number] Order moved by: GRAVES, CRYSTAL M Order move date/time: 08/28/2012  1:45 PM Source Patient:    [id number] Source Contact: 07/24/2012 Destination Patient:   [id number] Destination Contact: 04/29/2012

## 2012-09-06 ENCOUNTER — Ambulatory Visit (INDEPENDENT_AMBULATORY_CARE_PROVIDER_SITE_OTHER): Payer: Medicare Other | Admitting: Internal Medicine

## 2012-09-06 ENCOUNTER — Observation Stay (HOSPITAL_COMMUNITY)
Admission: AD | Admit: 2012-09-06 | Discharge: 2012-09-07 | Disposition: A | Discharge status: Home / Self Care | Payer: Medicare Other | Source: Ambulatory Visit | Attending: Internal Medicine | Admitting: Internal Medicine

## 2012-09-06 ENCOUNTER — Encounter: Payer: Self-pay | Admitting: Internal Medicine

## 2012-09-06 ENCOUNTER — Observation Stay (HOSPITAL_COMMUNITY): Payer: Medicare Other

## 2012-09-06 ENCOUNTER — Encounter (HOSPITAL_COMMUNITY): Payer: Self-pay | Admitting: *Deleted

## 2012-09-06 VITALS — BP 133/84 | HR 70 | Temp 98.0°F | Ht 66.0 in | Wt 264.6 lb

## 2012-09-06 DIAGNOSIS — I10 Essential (primary) hypertension: Secondary | ICD-10-CM | POA: Insufficient documentation

## 2012-09-06 DIAGNOSIS — R519 Headache, unspecified: Secondary | ICD-10-CM | POA: Diagnosis present

## 2012-09-06 DIAGNOSIS — I5042 Chronic combined systolic (congestive) and diastolic (congestive) heart failure: Secondary | ICD-10-CM | POA: Diagnosis present

## 2012-09-06 DIAGNOSIS — R42 Dizziness and giddiness: Principal | ICD-10-CM

## 2012-09-06 DIAGNOSIS — R51 Headache: Secondary | ICD-10-CM | POA: Insufficient documentation

## 2012-09-06 DIAGNOSIS — I5020 Unspecified systolic (congestive) heart failure: Secondary | ICD-10-CM | POA: Diagnosis present

## 2012-09-06 DIAGNOSIS — I5022 Chronic systolic (congestive) heart failure: Secondary | ICD-10-CM | POA: Insufficient documentation

## 2012-09-06 DIAGNOSIS — K219 Gastro-esophageal reflux disease without esophagitis: Secondary | ICD-10-CM | POA: Insufficient documentation

## 2012-09-06 DIAGNOSIS — E119 Type 2 diabetes mellitus without complications: Secondary | ICD-10-CM | POA: Insufficient documentation

## 2012-09-06 DIAGNOSIS — I509 Heart failure, unspecified: Secondary | ICD-10-CM | POA: Insufficient documentation

## 2012-09-06 DIAGNOSIS — M25819 Other specified joint disorders, unspecified shoulder: Secondary | ICD-10-CM | POA: Insufficient documentation

## 2012-09-06 DIAGNOSIS — R630 Anorexia: Secondary | ICD-10-CM | POA: Insufficient documentation

## 2012-09-06 DIAGNOSIS — I2510 Atherosclerotic heart disease of native coronary artery without angina pectoris: Secondary | ICD-10-CM | POA: Insufficient documentation

## 2012-09-06 DIAGNOSIS — M7541 Impingement syndrome of right shoulder: Secondary | ICD-10-CM | POA: Diagnosis present

## 2012-09-06 DIAGNOSIS — R197 Diarrhea, unspecified: Secondary | ICD-10-CM | POA: Insufficient documentation

## 2012-09-06 DIAGNOSIS — R110 Nausea: Secondary | ICD-10-CM | POA: Insufficient documentation

## 2012-09-06 LAB — COMPREHENSIVE METABOLIC PANEL
ALT: 18 U/L (ref 0–35)
AST: 18 U/L (ref 0–37)
CO2: 25 mEq/L (ref 19–32)
Calcium: 9.6 mg/dL (ref 8.4–10.5)
Potassium: 3.9 mEq/L (ref 3.5–5.1)
Sodium: 138 mEq/L (ref 135–145)
Total Protein: 7.9 g/dL (ref 6.0–8.3)

## 2012-09-06 LAB — GLUCOSE, CAPILLARY: Glucose-Capillary: 136 mg/dL — ABNORMAL HIGH (ref 70–99)

## 2012-09-06 LAB — RAPID URINE DRUG SCREEN, HOSP PERFORMED
Amphetamines: NOT DETECTED
Benzodiazepines: NOT DETECTED
Cocaine: NOT DETECTED
Opiates: NOT DETECTED

## 2012-09-06 LAB — URINALYSIS, ROUTINE W REFLEX MICROSCOPIC
Bilirubin Urine: NEGATIVE
Ketones, ur: NEGATIVE mg/dL
Nitrite: NEGATIVE
Urobilinogen, UA: 0.2 mg/dL (ref 0.0–1.0)
pH: 5 (ref 5.0–8.0)

## 2012-09-06 LAB — CK TOTAL AND CKMB (NOT AT ARMC)
CK, MB: 4 ng/mL (ref 0.3–4.0)
Relative Index: 2.3 (ref 0.0–2.5)
Total CK: 175 U/L (ref 7–177)

## 2012-09-06 MED ORDER — ASPIRIN 81 MG PO TABS: 81.0000 mg | ORAL_TABLET | Freq: Every day | ORAL | Status: DC

## 2012-09-06 MED ORDER — SODIUM CHLORIDE 0.9 % IV SOLN: 250.0000 mL | INTRAVENOUS | Status: DC | PRN

## 2012-09-06 MED ORDER — INSULIN ASPART 100 UNIT/ML ~~LOC~~ SOLN
0.0000 [IU] | Freq: Three times a day (TID) | SUBCUTANEOUS | Status: DC
  Administered 2012-09-07: 5 [IU] via SUBCUTANEOUS
  Administered 2012-09-07 (×2): 3 [IU] via SUBCUTANEOUS

## 2012-09-06 MED ORDER — DORZOLAMIDE HCL-TIMOLOL MAL 2-0.5 % OP SOLN
1.0000 [drp] | Freq: Two times a day (BID) | OPHTHALMIC | Status: DC
  Administered 2012-09-06 – 2012-09-07 (×2): 1 [drp] via OPHTHALMIC
  Filled 2012-09-06: qty 10

## 2012-09-06 MED ORDER — SODIUM CHLORIDE 0.9 % IJ SOLN: 3.0000 mL | INTRAMUSCULAR | Status: DC | PRN

## 2012-09-06 MED ORDER — INSULIN ASPART 100 UNIT/ML ~~LOC~~ SOLN: 0.0000 [IU] | Freq: Three times a day (TID) | SUBCUTANEOUS | Status: DC

## 2012-09-06 MED ORDER — GABAPENTIN 600 MG PO TABS
600.0000 mg | ORAL_TABLET | Freq: Three times a day (TID) | ORAL | Status: DC
  Administered 2012-09-06 – 2012-09-07 (×3): 600 mg via ORAL
  Filled 2012-09-06 (×4): qty 1

## 2012-09-06 MED ORDER — INSULIN GLARGINE 100 UNIT/ML ~~LOC~~ SOLN
30.0000 [IU] | Freq: Every day | SUBCUTANEOUS | Status: DC
  Administered 2012-09-06: 30 [IU] via SUBCUTANEOUS

## 2012-09-06 MED ORDER — ACETAMINOPHEN 650 MG RE SUPP: 650.0000 mg | Freq: Four times a day (QID) | RECTAL | Status: DC | PRN

## 2012-09-06 MED ORDER — CYCLOBENZAPRINE HCL 10 MG PO TABS: 10.0000 mg | ORAL_TABLET | Freq: Three times a day (TID) | ORAL | Status: DC | PRN

## 2012-09-06 MED ORDER — TRAMADOL HCL 50 MG PO TABS
50.0000 mg | ORAL_TABLET | Freq: Four times a day (QID) | ORAL | Status: DC | PRN
  Administered 2012-09-06 – 2012-09-07 (×2): 50 mg via ORAL
  Filled 2012-09-06 (×2): qty 1

## 2012-09-06 MED ORDER — SODIUM CHLORIDE 0.9 % IJ SOLN
3.0000 mL | Freq: Two times a day (BID) | INTRAMUSCULAR | Status: DC
  Administered 2012-09-07: 3 mL via INTRAVENOUS

## 2012-09-06 MED ORDER — CARVEDILOL 6.25 MG PO TABS
6.2500 mg | ORAL_TABLET | Freq: Two times a day (BID) | ORAL | Status: DC
  Administered 2012-09-06 – 2012-09-07 (×3): 6.25 mg via ORAL
  Filled 2012-09-06 (×4): qty 1

## 2012-09-06 MED ORDER — AMITRIPTYLINE HCL 25 MG PO TABS
25.0000 mg | ORAL_TABLET | Freq: Every day | ORAL | Status: DC
  Administered 2012-09-06: 25 mg via ORAL
  Filled 2012-09-06 (×2): qty 1

## 2012-09-06 MED ORDER — ACETAMINOPHEN 325 MG PO TABS
650.0000 mg | ORAL_TABLET | Freq: Four times a day (QID) | ORAL | Status: DC | PRN
  Filled 2012-09-06: qty 2

## 2012-09-06 MED ORDER — SODIUM CHLORIDE 0.9 % IJ SOLN: 3.0000 mL | Freq: Two times a day (BID) | INTRAMUSCULAR | Status: DC

## 2012-09-06 MED ORDER — HEPARIN SODIUM (PORCINE) 5000 UNIT/ML IJ SOLN
5000.0000 [IU] | Freq: Three times a day (TID) | INTRAMUSCULAR | Status: DC
  Administered 2012-09-06 – 2012-09-07 (×3): 5000 [IU] via SUBCUTANEOUS
  Filled 2012-09-06 (×5): qty 1

## 2012-09-06 MED ORDER — FERROUS SULFATE 325 (65 FE) MG PO TABS
325.0000 mg | ORAL_TABLET | Freq: Every day | ORAL | Status: DC
  Administered 2012-09-07: 325 mg via ORAL
  Filled 2012-09-06 (×3): qty 1

## 2012-09-06 MED ORDER — PROMETHAZINE HCL 25 MG PO TABS: 25.0000 mg | ORAL_TABLET | Freq: Three times a day (TID) | ORAL | Status: DC | PRN

## 2012-09-06 MED ORDER — INSULIN ASPART 100 UNIT/ML ~~LOC~~ SOLN: 0.0000 [IU] | Freq: Every day | SUBCUTANEOUS | Status: DC

## 2012-09-06 MED ORDER — INSULIN ASPART 100 UNIT/ML ~~LOC~~ SOLN
0.0000 [IU] | SUBCUTANEOUS | Status: DC
Start: 2012-09-06 — End: 2012-09-06

## 2012-09-06 MED ORDER — PANTOPRAZOLE SODIUM 40 MG PO TBEC
40.0000 mg | DELAYED_RELEASE_TABLET | Freq: Every day | ORAL | Status: DC
  Administered 2012-09-07: 40 mg via ORAL
  Filled 2012-09-06 (×2): qty 1

## 2012-09-06 MED ORDER — ASPIRIN EC 81 MG PO TBEC
81.0000 mg | DELAYED_RELEASE_TABLET | Freq: Every day | ORAL | Status: DC
  Administered 2012-09-07: 81 mg via ORAL
  Filled 2012-09-06: qty 1

## 2012-09-06 MED ORDER — SIMVASTATIN 20 MG PO TABS
20.0000 mg | ORAL_TABLET | Freq: Every day | ORAL | Status: DC
  Administered 2012-09-07: 20 mg via ORAL
  Filled 2012-09-06: qty 1

## 2012-09-06 NOTE — Assessment & Plan Note (Signed)
Consistent with central vertigo rather than peripheral. With her many risk factors, stroke must be ruled out. Mass lesions also possible. The associated symptoms of left arm paresthesias and headache and the associated findings of left-sided sensorineural hearing loss are worrisome. We are admitting the patient for stroke workup. - Admit

## 2012-09-06 NOTE — Progress Notes (Signed)
Subjective:    Patient ID: [name], female    DOB: 1960/10/12, 52 y.o.   MRN: [id number]  HPI:  This is a 52 year old woman with uncontrolled type 2 diabetes, diabetic neuropathy, hyperlipidemia, obesity, hypertension, nonobstructive CAD, and chronic systolic heart failure (EF 35-40%). She comes to clinic with 3-4 days of dizziness.  Dizziness Onset was 3-4 days ago. No provoking incident identified by the patient. She describes the sensation as the room is spinning. She also describes lightheadedness and a feeling that she is going to pass out. This sensation is intermittent, but it has occurred multiple times every day for the past 3-4 days. She notices this sensation when she is lying down, with position changes, and with walking. No one movement or activity makes this worse or better. This is associated with headache, paresthesias in the left arm, and a fall one day ago. It is not associated with visual changes, hearing changes, tinnitus, speech difficulties, or syncope.  Headache Onset was 34 days ago. No provoking incident identified by the patient. Quality described as "heaviness". Location is crown of the head without radiation. Severity currently rated at 10/10. Timing is constant for the past 3-4 days. Associated with dizziness, paresthesias of the left arm, and a fall one day ago. Tylenol has been tried with some relief.  Paresthesias Onset 3-4 days ago. Patient describes a strange numbness in her left fingers, hand, and forearm. She also describes a cold sensation in her left hand.   Review of Systems  Constitutional: Negative for fever and chills.  HENT: Positive for rhinorrhea (1 to 2 weeks). Negative for hearing loss, ear pain, congestion, sore throat, neck pain, neck stiffness, voice change, sinus pressure and tinnitus.   Eyes: Negative for visual disturbance.  Respiratory: Positive for shortness of breath.   Cardiovascular: Positive for chest pain and leg swelling.   Gastrointestinal: Positive for nausea. Negative for vomiting and abdominal pain.  Endocrine: Positive for polyuria. Negative for polydipsia.  Musculoskeletal: Positive for back pain (thoracic) and gait problem.  Neurological: Positive for dizziness, light-headedness, numbness and headaches. Negative for syncope, facial asymmetry, speech difficulty and weakness.    Current medications: 1. Amitriptyline 25 mg each bedtime 2. Aspirin 81 mg daily 3. Carvedilol 6.25 mg twice a day 4. Cyclobenzaprine 10 mg every 8 hours as needed for muscle spasms 5. Dorzolamide-timolol ophthalmic 1 drop right eye twice a day 6. Ferrous sulfate 325 mg with breakfast 7. Furosemide 40 mg daily 8. Gabapentin 600 mg 3 times a day 9. Insulin aspart 3-6 units 3 times a day before meals 10. Insulin glargine 40 units each bedtime 11. Lisinopril 10 mg daily 12. Metformin 1000 mg twice a day 13. Omeprazole 20 mg daily 14. Pravastatin 20 mg daily 15. Promethazine 1 tablet every 6 hours as needed for nausea 16. Tramadol 50 mg every 6 hours as needed for pain     Objective:   Physical Exam: GENERAL: obese, no acute distress HEAD: atraumatic, normocephalic, no tenderness of the skull base or posterior skull EYES: pupils equal, round and reactive; sclera anicteric; normal conjunctiva EARS: external ears normal, right now obstructed by cerumen, left TM and canal normal NOSE: normal nasal mucosa, no erythema or drainage MOUTH/THROAT: oropharynx clear, moist mucous membranes, pink gingiva NECK: supple, no carotid bruits, thyroid normal in size and without palpable nodules LYMPH: no cervical or supraclavicular lymphadenopathy LUNGS: clear to auscultation bilaterally, normal work of breathing HEART: normal rate and regular rhythm; normal S1 and S2 without S3 or  S4; no murmurs, rubs, or clicks PULSES: radial 2+ and symmetric ABDOMEN: soft, non-tender, normal bowel sounds MSK: tenderness with palpation of the upper  thoracic spine, no tenderness of the cervical spine MOTOR: grip strength, arm flexion, arm extension, leg extension, and leg flexion are all equal bilaterally; strength is decreased with all maneuvers, but equally so, 4+/5 SENSATION: patient reports intact but decreased sensation in the left fingers, hand, and forearm; sensation in the right upper extremity is normal REFLEXES: unable to elicit biceps or patellar reflexes CRANIAL NERVES: pupils reactive to light bilaterally; extra occular muscles are intact; facial sensation is intact and equal bilaterally in V1, V2, and V3; forehead wrinkles symmetrically, orbicularis oculi strength is normal and equal bilaterally, smile is symmetric, and depressor anguli oris function is intact bilaterally; decreased hearing in the left ear with a tuning fork, Weber test lateralizes to the right; uvula is midline and palate elevates symmetrically; trapezius and sternocleidomastoid strength is normal and equal bilaterally; tongue protrudes midline SKIN: hands are cold bilaterally; otherwise warm, dry, intact, normal turgor, no rashes EXTREMITIES: 1+ pitting edema in the ankles bilaterally; no clubbing or cyanosis        Assessment & Plan:

## 2012-09-06 NOTE — Assessment & Plan Note (Signed)
Concerning for stroke or mass lesion given the associated vertigo, left arm paresthesia, and left-sided sensorineural hearing loss. - Admit

## 2012-09-06 NOTE — H&P (Signed)
Hospital Admission Note Date: 09/06/2012  Patient name: [name] Medical record number: [id number] Date of birth: February 25, 1961 Age: 52 y.o. Gender: female PCP: Kristie Cowman, MD  Medical Service: Internal Medicine Teaching Service  Attending physician:  Dr. Eben Burow   1st Contact:  Dr. Garald Braver  [phone] 2nd Contact:  Dr. Clyde Lundborg   [phone] After 5 pm or weekends: 1st Contact:      Pager: [phone] 2nd Contact:      Pager: [phone]  Chief Complaint: Dizziness, left forearm cold sensation  History of Present Illness: [name] is a 52 year old woman with PMH of DM2, HTN, GERD, CAD, impingement of right shoulder syndrome, who comes in to the [name] for evaluation of dizziness and left arm cold sensation. She states that in the past 5 days her appetite has been decreased as she had gastrointestinal symptoms with diarrhea of 2 days, nausea, and decreased intake per mouth. She states that she took Imodium for her diarrhea and this resolved two days ago but she continues to have nausea and poor appetite. She has been lightheaded upon standing for 3-4 days but denies falls. Last night she had chest soreness, right shoulder soreness, and a sensation of a "cold" in her left forearm. These symptoms prevented her from sleeping well and she feels tired today. This morning she had a headache described as bilateral  occipital headache that is currently present and described as a constant ache 10/10 in intensity with no photophobia, visual changes, or lacrimation.   She denies confusion, increased shortness of breath, abdominal pain, back pain, hematuria, dysuria, or diarrhea.    Meds: Medications Prior to Admission  Medication Sig Dispense Refill  . amitriptyline (ELAVIL) 25 MG tablet TAKE ONE TABLET BY MOUTH AT BEDTIME  30 tablet  0  . aspirin 81 MG tablet Take 1 tablet (81 mg total) by mouth daily.  30 tablet  11  . carvedilol (COREG) 6.25 MG tablet Take 1 tablet (6.25 mg total) by mouth 2  (two) times daily.  60 tablet  11  . cyclobenzaprine (FLEXERIL) 10 MG tablet Take 10 mg by mouth every 8 (eight) hours as needed. For muscle spasms      . dorzolamide-timolol (COSOPT) 22.3-6.8 MG/ML ophthalmic solution Place 1 drop into the right eye 2 (two) times daily.      . ferrous sulfate 325 (65 FE) MG tablet Take 1 tablet (325 mg total) by mouth daily with breakfast.  30 tablet  11  . gabapentin (NEURONTIN) 600 MG tablet Take 1 tablet (600 mg total) by mouth 3 (three) times daily.  90 tablet  6  . insulin glargine (LANTUS) 100 UNIT/ML injection Inject 40 Units into the skin at bedtime.  10 mL  12  . omeprazole (PRILOSEC) 20 MG capsule Take 1 capsule (20 mg total) by mouth daily.  30 capsule  11  . pravastatin (PRAVACHOL) 20 MG tablet Take 1 tablet (20 mg total) by mouth daily.  30 tablet  6  . promethazine (PHENERGAN) 12.5 MG tablet take 1 tablet by mouth every 6 hours if needed for nausea  30 tablet  0  . traMADol (ULTRAM) 50 MG tablet Take 1 tablet (50 mg total) by mouth every 6 (six) hours as needed. For pain  30 tablet  6   Allergies: Allergies as of 09/06/2012  . (No Known Allergies)   Past Medical History  Diagnosis Date  . Hypertension   . Hyperlipidemia   . GERD (gastroesophageal reflux disease)   .  Adhesive capsulitis of shoulder     bilateral, Steroid injection Dr. Nedra Hai 1/12 bilaterally  . Peripheral neuropathy   . Adhesive capsulitis of right shoulder     Steroid injection Dr. Nedra Hai 1/12  . Adhesive capsulitis of left shoulder     Steroid injection Dr. Nedra Hai 1/12  . Obesity   . Diabetic retinopathy(362.0)   . Glaucoma(365)   . Diabetes mellitus     Type II, insulin dependent  . CAD (coronary artery disease)     nonobstructive. Last cardiac cath (2008) showing left circumflex with mid 50% stenosis and distal luminal irregularities. Also with RCA with mid to distal 30-40% stenosis. // Previously evaluated by [facility], never followed up outpatient.  . CHF  (congestive heart failure)    Past Surgical History  Procedure Laterality Date  . Cataract extraction    . Glaucoma surgery    . Cardiac catheterization    . Tubal ligation     Family History  Problem Relation Age of Onset  . Hypertension Sister   . Hypertension Sister   . Hypertension Sister    History   Social History  . Marital Status: Single    Spouse Name: N/A    Number of Children: 2  . Years of Education: 12th grade   Occupational History  . 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    Social History Main Topics  . Smoking status: Former Smoker -- 0.30 packs/day for 7 years    Types: Cigarettes    Quit date: 06/27/2006  . Smokeless tobacco: Never Used  . Alcohol Use: No  . Drug Use: No  . Sexually Active: Not Currently   Other Topics Concern  . Not on file   Social History Narrative   Lives with her kids, 28yo daughter and 76 yo adopted son.    Review of Systems: Pertinent items are noted in HPI.  Physical Exam: Blood pressure 129/83, pulse 94, temperature 98.3 F (36.8 C), temperature source Oral, height 5\' 6"  (1.676 m), weight 258 lb 1.6 oz (117.073 kg), last menstrual period 02/13/2011, SpO2 97.00%. Vitals reviewed. General: Sitting in chair, in NAD, obese HEENT: PERRL, no scleral icterus. Left eye with lateral deviation (chronic) at rest but otherwise EOM intact.   Cardiac: RRR, no rubs, murmurs or gallops but distant heart sounds secondary to body habitus Pulm: clear to auscultation bilaterally, no wheezes, rales, or rhonchi Abd: soft, nontender, nondistended, BS present Ext: Warm and well perfused. 1+ pitting edema in bilateral ankles.   Neuro: alert and oriented X3, cranial nerves II-XII intact, left hand grip>right (she is left handed), left forearm sensation to fine touch (cottong ball) decreased compared to right, sensation equal and bilateral in LE, strength 5/5 in UE and LE. RAM  intact. Shin-to-heel test intact. No Rhomberg present. Normal brachial and patellar reflex.  MSK: Chest tenderness upon gentle palpation, tenderness in left and right forearm with L>R. Tenderness in LE.   Lab results: Basic Metabolic Panel: Pending  CBC: Pending  Cardiac Enzymes: Pending  CBG:  Recent Labs  09/06/12 1349 09/06/12 1807  GLUCAP 136* 127*   Urine Drug Screen: Drugs of Abuse     Component Value Date/Time   LABOPIA POSITIVE* 08/18/2008 1500   COCAINSCRNUR NONE DETECTED 08/18/2008 1500   LABBENZ NONE DETECTED 08/18/2008 1500   AMPHETMU NONE DETECTED 08/18/2008 1500   THCU NONE DETECTED 08/18/2008 1500   LABBARB  Value: NONE DETECTED  DRUG SCREEN FOR MEDICAL PURPOSES ONLY.  IF CONFIRMATION IS NEEDED FOR ANY PURPOSE, NOTIFY LAB WITHIN 5 DAYS.        LOWEST DETECTABLE LIMITS FOR URINE DRUG SCREEN Drug Class       Cutoff (ng/mL) Amphetamine      1000 Barbiturate      200 Benzodiazepine   200 Tricyclics       300 Opiates          300 Cocaine          300 THC              50 08/18/2008 1500     Imaging results:  MRI brain pending  Other results: EKG: Pending  Assessment & Plan by Problem:  Lightheadedness. Most likely etiology is orthostatic hypotension given report of recent diarrhea and decreased intake per mouth for the past 4-5 days. ACS, TIA (though unlikely given no Neurologic changes ), elevated HTN, as well as worsening CHF( although she appears hypovolemic on physical exam) are also possible. She will be admitted for further evaluation of her symptoms and work up. Neurology consulted to see patient with recommendation for MRI brain.  -Inpatient Observation -2 view CXR -EKG now -BMET in AM -CMP now -Mg -CK -NPO for now, SLP eval -Fall precaution -troponin q6h x3 -UDS -Phenergan PRN for nausea -Protonix PO -Neuro checks -Strict I&O -Daily weights  HTN. BP elevated to 156/89 at [facility] but she is orthostatic with standing BP dropping to 133/94 with  complaint of dizziness. She is on home Coreg, Lisinopril, and Lasix. Will continue Coreg at this time given history of CHF.  -Coreg 6.25mg  BID  CHF. Stable with no worsening dyspnea, and no signs of volume overload on physical exam. Last 2D echo on 01/23/12 with EF of 35-40%, mild LVH, diffuse hypokinesis. Will continue Coreg but hold Lasix and Lisinopril with orthostatic hypotension.   Headache. Could be secondary to lack of sleep, AKI, elevated BP, or migraine. She does not have history of migraine, however.  -Tylenol PRN -Ultram 50mg  q6hr PRN for pain -f/u BMET -f/u MRI brain and Neurology recommendations per above.  -Continue Elavil 25mg  qHS  Left forearm cold sensation and tenderness. Could be secondary to generalized MSK, ACS, or rhabdomyolysis. -Neurology consulted, appreciate recommendations -MRI of brain per Neurology -Continue flexeril, gabapentin -CK, UA  DM2. Last Hgb A1C of 8.9% in 06/15/2012. She is on Lantus 40 units qHS.  -CBGs q4h -Lantus 30 units qHS -SSI sensitive   CAD. Stable. Continue statin, Zocor 20mg  formulary for her home Pravachol 20mg . ASA 81mg .    DVT prophylaxis: Heparin TID   Dispo: Disposition is deferred at this time, awaiting improvement of current medical problems. Anticipated discharge in approximately 1-2 day(s).   The patient does have a current PCP (SCHOOLER, KAREN, MD), therefore will not be requiring OPC follow-up after discharge.   The patient does not have transportation limitations that hinder transportation to clinic appointments.  Signed: Ky Barban 09/06/2012, 6:19 PM

## 2012-09-06 NOTE — Progress Notes (Signed)
REGLA FITZGIBBON [id number] Admitted to 5525: 09/06/2012 6:03 PM Attending Provider: Lars Mage, MD    [name] is a 52 y.o. female patient admitted from ED awake, alert  & orientated  X 3,  No Order, VSS - 98.3, 20, 94., 129/83, 97% R/A,  Last menstrual period 02/13/2011., no c/o shortness of breath, no c/o chest pain, no distress noted. Tele # [id number] placed and pt is currently running:normal sinus rhythm.   IV site WDL:  hand left, condition patent and no redness with a transparent dsg that's clean dry and intact.  Allergies:  No Known Allergies   Past Medical History  Diagnosis Date  . Hypertension   . Hyperlipidemia   . GERD (gastroesophageal reflux disease)   . Adhesive capsulitis of shoulder     bilateral, Steroid injection Dr. Nedra Hai 1/12 bilaterally  . Peripheral neuropathy   . Adhesive capsulitis of right shoulder     Steroid injection Dr. Nedra Hai 1/12  . Adhesive capsulitis of left shoulder     Steroid injection Dr. Nedra Hai 1/12  . Obesity   . Diabetic retinopathy(362.0)   . Glaucoma(365)   . Diabetes mellitus     Type II, insulin dependent  . CAD (coronary artery disease)     nonobstructive. Last cardiac cath (2008) showing left circumflex with mid 50% stenosis and distal luminal irregularities. Also with RCA with mid to distal 30-40% stenosis. // Previously evaluated by [facility], never followed up outpatient.  . CHF (congestive heart failure)       Pt orientation to unit, room and routine. Information packet given to patient/family and safety video watched.  Admission INP armband ID verified with patient/family, and in place. SR up x 2, pt. Will be high fall risk because of recent fall.  Yellow arm band & red sock placed and bed alarm set.  Patient verbalizing understanding of risks associated with falls. Pt verbalizes an understanding of how to use the call bell and to call for help before getting out of bed.  Skin, clean-dry- intact without evidence of bruising,  or skin tears.   No evidence of skin break down noted on exam.    Will cont to monitor and assist as needed.  Joana Reamer, RN 09/06/2012 6:03 PM

## 2012-09-06 NOTE — Consult Note (Signed)
NEURO HOSPITALIST CONSULT NOTE    Reason for Consult:dizziness, imbalance, left arm numbness.  HPI:                                                                                                                                          [name] is an 52 y.o. female with a past medical history remarkable for hypertension, hyperlipidemia, diabetes for almost 30 years, diabetic neuropathy, non obstructive CAD, CHF, GERD, and new onset dizziness, dysequilibrium, and left arm numbness. Stated that the beginning of this week she started noticing " numbness of the fingertips left hand that then progressed to the left arm" but without face or leg lower extremity involvement. Then, approximately the next days she started having off and on episodes of " spinning and lightheadedness" lasting for a couple of minutes associated with troubles with her balance and the sensation that she can easily fall. No nausea, vomiting, double vision, difficulty swallowing, focal weakness, slurred speech, vision or language impairment. Denies tinnitus, head or neck trauma but daid that standing up makes the dizziness worse. [name] admits to infrequent episodes of dizziness in the past " but nothing to this extent".     Past Medical History  Diagnosis Date  . Hypertension   . Hyperlipidemia   . GERD (gastroesophageal reflux disease)   . Adhesive capsulitis of shoulder     bilateral, Steroid injection Dr. Nedra Hai 1/12 bilaterally  . Peripheral neuropathy   . Adhesive capsulitis of right shoulder     Steroid injection Dr. Nedra Hai 1/12  . Adhesive capsulitis of left shoulder     Steroid injection Dr. Nedra Hai 1/12  . Obesity   . Diabetic retinopathy(362.0)   . Glaucoma(365)   . Diabetes mellitus     Type II, insulin dependent  . CAD (coronary artery disease)     nonobstructive. Last cardiac cath (2008) showing left circumflex with mid 50% stenosis and distal luminal irregularities. Also with  RCA with mid to distal 30-40% stenosis. // Previously evaluated by [facility], never followed up outpatient.  . CHF (congestive heart failure)     Past Surgical History  Procedure Laterality Date  . Cataract extraction    . Glaucoma surgery    . Cardiac catheterization    . Tubal ligation      Family history: hypertension.  Social History:  reports that she quit smoking about 6 years ago. Her smoking use included Cigarettes. She has a 2.1 pack-year smoking history. She has never used smokeless tobacco. She reports that she does not drink alcohol or use illicit drugs.  No Known Allergies  MEDICATIONS:  I have reviewed the patient's current medications.   ROS:                                                                                                                                       History obtained from the patient and chart review.  General ROS: negative for - chills, fatigue, fever, night sweats, weight gain or weight loss Psychological ROS: negative for - behavioral disorder, hallucinations, memory difficulties, mood swings or suicidal ideation Ophthalmic ROS: negative for - blurry vision, double vision, eye pain or loss of vision ENT ROS: negative for - epistaxis, nasal discharge, oral lesions, sore throat, tinnitus or vertigo Allergy and Immunology ROS: negative for - hives or itchy/watery eyes Hematological and Lymphatic ROS: negative for - bleeding problems, bruising or swollen lymph nodes Endocrine ROS: negative for - galactorrhea, hair pattern changes, polydipsia/polyuria or temperature intolerance Respiratory ROS: negative for - cough, hemoptysis, shortness of breath or wheezing Cardiovascular ROS: negative for - chest pain, dyspnea on exertion, edema or irregular heartbeat Gastrointestinal ROS: negative for - abdominal pain, diarrhea,  hematemesis, nausea/vomiting or stool incontinence Genito-Urinary ROS: negative for - dysuria, hematuria, incontinence or urinary frequency/urgency Musculoskeletal ROS: negative for - joint swelling or muscular weakness Neurological ROS: as noted in HPI Dermatological ROS: negative for rash and skin lesion changes   Physical exam: pleasant female in no apparent distress. Blood pressure 129/83, pulse 94, temperature 97.7 F (36.5 C), temperature source Oral, resp. rate 20, height 5\' 6"  (1.676 m), weight 117.073 kg (258 lb 1.6 oz), last menstrual period 02/13/2011, SpO2 97.00%. Head: normocephalic. Neck: supple, no bruits, no JVD. Cardiac: no murmurs. Lungs: clear. Abdomen: soft, no tender, no mass. Extremities: no edema.  Neurologic Examination:                                                                                                      Mental Status: Alert, awake, oriented x 4, thought content appropriate. Comprehension, naming, and repetition within normal limits. Speech fluent without evidence of aphasia.  Cranial Nerves: II: Discs flat bilaterally; Visual fields grossly normal, pupils equal, round, reactive to light and accommodation III,IV, VI: ptosis not present, extra-ocular motions intact bilaterally V,VII: smile symmetric, facial light touch sensation normal bilaterally VIII: hearing normal bilaterally IX,X: gag reflex present XI: bilateral shoulder shrug XII: midline tongue extension Motor: Right : Upper extremity   5/5    Left:     Upper extremity   5/5  Lower extremity   5/5  Lower extremity   5/5 Tone and bulk:normal tone throughout; no atrophy noted Sensory: Pinprick and light touch intact throughout, bilaterally Deep Tendon Reflexes: 2+ and symmetric throughout Plantars: Right: downgoing   Left: downgoing Cerebellar: normal finger-to-nose,  normal heel-to-shin test Gait: unable to test as she became very dizzy when I asked her to walk. CV: pulses  palpable throughout    Lab Results  Component Value Date/Time   CHOL 148 10/29/2011  6:10 AM    Results for orders placed during the hospital encounter of 09/06/12 (from the past 48 hour(s))  GLUCOSE, CAPILLARY     Status: Abnormal   Collection Time    09/06/12  6:07 PM      Result Value Range   Glucose-Capillary 127 (*) 70 - 99 mg/dL   Comment 1 Documented in Chart     Comment 2 Notify RN    COMPREHENSIVE METABOLIC PANEL     Status: Abnormal   Collection Time    09/06/12  6:31 PM      Result Value Range   Sodium 138  135 - 145 mEq/L   Potassium 3.9  3.5 - 5.1 mEq/L   Chloride 99  96 - 112 mEq/L   CO2 25  19 - 32 mEq/L   Glucose, Bld 132 (*) 70 - 99 mg/dL   BUN 20  6 - 23 mg/dL   Creatinine, Ser 1.61  0.50 - 1.10 mg/dL   Calcium 9.6  8.4 - 09.6 mg/dL   Total Protein 7.9  6.0 - 8.3 g/dL   Albumin 3.8  3.5 - 5.2 g/dL   AST 18  0 - 37 U/L   ALT 18  0 - 35 U/L   Alkaline Phosphatase 68  39 - 117 U/L   Total Bilirubin 0.1 (*) 0.3 - 1.2 mg/dL   GFR calc non Af Amer 74 (*) >90 mL/min   GFR calc Af Amer 85 (*) >90 mL/min   Comment:            The eGFR has been calculated     using the CKD EPI equation.     This calculation has not been     validated in all clinical     situations.     eGFR's persistently     <90 mL/min signify     possible Chronic Kidney Disease.  MAGNESIUM     Status: Abnormal   Collection Time    09/06/12  6:31 PM      Result Value Range   Magnesium 1.3 (*) 1.5 - 2.5 mg/dL  PRO B NATRIURETIC PEPTIDE     Status: None   Collection Time    09/06/12  6:31 PM      Result Value Range   Pro B Natriuretic peptide (BNP) 8.2  0 - 125 pg/mL  TROPONIN I     Status: None   Collection Time    09/06/12  6:31 PM      Result Value Range   Troponin I <0.30  <0.30 ng/mL   Comment:            Due to the release kinetics of cTnI,     a negative result within the first hours     of the onset of symptoms does not rule out     myocardial infarction with certainty.      If myocardial infarction is still suspected,     repeat the test at appropriate intervals.    No results found.  Assessment/Plan: 52 y.o. female with a past medical history remarkable for hypertension, hyperlipidemia, diabetes for almost 30 years, diabetic neuropathy, non obstructive CAD, CHF, GERD, and new onset intermittent dizziness associated with dysequilibrium, and left arm numbness. Differential diagnosis include posterior circulation cerebrovascular insult and thus agree with MRI/MRA brain. PT to help with disequilibrium. Continue aspirin pending neuro-imaging results.   Wyatt Portela ,MD [facility] [phone]  09/06/2012, 7:51 PM

## 2012-09-06 NOTE — Progress Notes (Signed)
Rapid response nurse call to do stroke swallow screen and [facility] since is questionable TIA/CVA.  Will continue to monitor.  Forbes Cellar, RN

## 2012-09-06 NOTE — Progress Notes (Signed)
Saline loc started in left hand with # 22 Angiocath. Called for bed

## 2012-09-06 NOTE — Plan of Care (Signed)
Problem: Phase I Progression Outcomes Goal: Initial discharge plan identified Outcome: Completed/Met Date Met:  09/06/12 To return home with family

## 2012-09-07 ENCOUNTER — Observation Stay (HOSPITAL_COMMUNITY): Payer: Medicare Other

## 2012-09-07 DIAGNOSIS — R42 Dizziness and giddiness: Principal | ICD-10-CM

## 2012-09-07 DIAGNOSIS — I517 Cardiomegaly: Secondary | ICD-10-CM

## 2012-09-07 LAB — CBC
HCT: 32.1 % — ABNORMAL LOW (ref 36.0–46.0)
Hemoglobin: 10.5 g/dL — ABNORMAL LOW (ref 12.0–15.0)
MCHC: 32.7 g/dL (ref 30.0–36.0)
RDW: 14.8 % (ref 11.5–15.5)
WBC: 8.7 10*3/uL (ref 4.0–10.5)

## 2012-09-07 LAB — GLUCOSE, CAPILLARY
Glucose-Capillary: 215 mg/dL — ABNORMAL HIGH (ref 70–99)
Glucose-Capillary: 225 mg/dL — ABNORMAL HIGH (ref 70–99)

## 2012-09-07 LAB — BASIC METABOLIC PANEL
CO2: 28 mEq/L (ref 19–32)
Glucose, Bld: 257 mg/dL — ABNORMAL HIGH (ref 70–99)
Potassium: 4.2 mEq/L (ref 3.5–5.1)
Sodium: 136 mEq/L (ref 135–145)

## 2012-09-07 LAB — HEMOGLOBIN A1C
Hgb A1c MFr Bld: 9.2 % — ABNORMAL HIGH (ref <5.7)
Mean Plasma Glucose: 217 mg/dL — ABNORMAL HIGH (ref <117)

## 2012-09-07 LAB — LIPID PANEL
Cholesterol: 176 mg/dL (ref 0–200)
Total CHOL/HDL Ratio: 3.6 RATIO
VLDL: 43 mg/dL — ABNORMAL HIGH (ref 0–40)

## 2012-09-07 LAB — PROTIME-INR: INR: 0.99 (ref 0.00–1.49)

## 2012-09-07 MED ORDER — SODIUM CHLORIDE 0.9 % IV SOLN
INTRAVENOUS | Status: DC
  Administered 2012-09-07: 1000 mL via INTRAVENOUS

## 2012-09-07 MED ORDER — MAGNESIUM SULFATE 40 MG/ML IJ SOLN
2.0000 g | Freq: Once | INTRAMUSCULAR | Status: AC
  Administered 2012-09-07: 2 g via INTRAVENOUS
  Filled 2012-09-07: qty 50

## 2012-09-07 NOTE — Progress Notes (Signed)
Patient able to dress self with miminal assistance from daughter.  IV d/c to right forearm without difficulty.  Patient is alert and oriented, understands discharge instructions without any questions, copy given along with note for daughter to excuse from work on the 13th and 14th.  Patients skin intact, via wheelchair escorted out to meet car for ride home.  Bonney Leitz RN

## 2012-09-07 NOTE — Discharge Summary (Signed)
Internal Medicine Teaching [facility] Discharge Note  Name: [name] MRN: [id number] DOB: 08/30/1960 52 y.o.  Date of Admission: 09/06/2012  5:59 PM Date of Discharge: 09/07/2012 Attending Physician: Lars Mage, MD  Discharge Diagnosis: Principal Problem:   Lightheadedness Active Problems:   CAD (coronary artery disease)   Systolic CHF   Impingement syndrome of right shoulder   Headache   Discharge Medications:   Medication List    TAKE these medications       amitriptyline 25 MG tablet  Commonly known as:  ELAVIL  TAKE ONE TABLET BY MOUTH AT BEDTIME     aspirin 81 MG tablet  Take 1 tablet (81 mg total) by mouth daily.     carvedilol 6.25 MG tablet  Commonly known as:  COREG  Take 1 tablet (6.25 mg total) by mouth 2 (two) times daily.     cyclobenzaprine 10 MG tablet  Commonly known as:  FLEXERIL  Take 10 mg by mouth every 8 (eight) hours as needed. For muscle spasms     dorzolamide-timolol 22.3-6.8 MG/ML ophthalmic solution  Commonly known as:  COSOPT  Place 1 drop into the right eye 2 (two) times daily.     ferrous sulfate 325 (65 FE) MG tablet  Take 1 tablet (325 mg total) by mouth daily with breakfast.     gabapentin 600 MG tablet  Commonly known as:  NEURONTIN  Take 1 tablet (600 mg total) by mouth 3 (three) times daily.     insulin aspart 100 UNIT/ML injection  Commonly known as:  novoLOG  Inject 4-6 Units into the skin 3 (three) times daily before meals. 4 units in the morning and 6 units in the evening     insulin glargine 100 UNIT/ML injection  Commonly known as:  LANTUS  Inject 40 Units into the skin at bedtime.     omeprazole 20 MG capsule  Commonly known as:  PRILOSEC  Take 1 capsule (20 mg total) by mouth daily.     pravastatin 20 MG tablet  Commonly known as:  PRAVACHOL  Take 1 tablet (20 mg total) by mouth daily.     promethazine 12.5 MG tablet  Commonly known as:  PHENERGAN  take 1 tablet by mouth every 6 hours if needed  for nausea     traMADol 50 MG tablet  Commonly known as:  ULTRAM  Take 1 tablet (50 mg total) by mouth every 6 (six) hours as needed. For pain        Disposition and follow-up:   [name] was discharged from [facility] in Stable condition.  At the hospital follow up visit please address: -Reassess continued oral rehydration and resolution of nausea -Reassess her volume status, orthostatic vitals, and continuation of Lasix  -She will need better control of her DM2, perhaps referral to [facility] -Assure that she continues taking ASA 81mg  daily for secondary stroke prevention  Follow-up Appointments:     Follow-up Information   Follow up with Darden Palmer, MD On 09/13/2012. (INTERNAL MEDICINE OUTPATIENT CLINIC - Your appointment is on 09/13/2012 at 2:15PM)    Contact information:   [address] [place] [address] [phone]      Discharge Orders   Future Appointments Provider Department Dept Phone   09/13/2012 2:15 PM Darden Palmer, MD [facility] [phone]   11/05/2012 8:30 AM Beatrice Lecher, PA-C [facility] [place]) [phone]   Future Orders Complete By  Expires     Diet - low sodium heart healthy  As directed     Increase activity slowly  As directed        Consultations: Treatment Team:  Md Stroke, MD  Procedures Performed:  X-ray Chest Pa And Lateral   09/07/2012  *RADIOLOGY REPORT*  Clinical Data: Chest pain, stroke, heart failure, hypertension, diabetes  CHEST - 2 VIEW  Comparison: 06/09/2012  Findings: Upper normal heart size. Normal mediastinal contours and pulmonary vascularity. Lungs clear. No pleural effusion or pneumothorax. Bones unremarkable.  IMPRESSION: No acute abnormalities.   Original Report Authenticated By: Ulyses Southward, M.D.    Mr [name] Wo Contrast  09/07/2012  *RADIOLOGY REPORT*  Clinical Data:  Stroke.  Dizziness  MRI HEAD WITHOUT CONTRAST MRA  HEAD WITHOUT CONTRAST  Technique:  Multiplanar, multiecho pulse sequences of the brain and surrounding structures were obtained without intravenous contrast. Angiographic images of the head were obtained using MRA technique without contrast.  Comparison:   None  MRI HEAD  Findings:  Negative for acute infarct.  Chronic ischemic changes in the frontal white matter bilaterally.  Patchy hyperintensity in the pons bilaterally compatible with chronic ischemia.  Negative for intracranial mass or hemorrhage.  Ventricle size is normal.  IMPRESSION: Chronic microvascular ischemia.  No acute infarct.  MRA HEAD  Findings: Fetal origin of the posterior cerebral artery bilaterally.  The distal basilar is hypoplastic.  The   left vertebral artery and left PICA are patent.  Right vertebral artery ends in pica and does not contribute to the basilar.  There is a prominent right  AICA.  Superior cerebellar arteries are patent. Posterior cerebral arteries are patent bilaterally.  Internal carotid artery is widely patent.  Anterior middle cerebral arteries are patent bilaterally without stenosis.  Negative for aneurysm.  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  No significant intracranial stenosis.   Original Report Authenticated By: Janeece Riggers, M.D.    Mr Brain Wo Contrast  09/07/2012  *RADIOLOGY REPORT*  Clinical Data:  Stroke.  Dizziness  MRI HEAD WITHOUT CONTRAST MRA HEAD WITHOUT CONTRAST  Technique:  Multiplanar, multiecho pulse sequences of the brain and surrounding structures were obtained without intravenous contrast. Angiographic images of the head were obtained using MRA technique without contrast.  Comparison:   None  MRI HEAD  Findings:  Negative for acute infarct.  Chronic ischemic changes in the frontal white matter bilaterally.  Patchy hyperintensity in the pons bilaterally compatible with chronic ischemia.  Negative for  intracranial mass or hemorrhage.  Ventricle size is normal.  IMPRESSION: Chronic microvascular ischemia.  No acute infarct.  MRA HEAD  Findings: Fetal origin of the posterior cerebral artery bilaterally.  The distal basilar is hypoplastic.  The   left vertebral artery and left PICA are patent.  Right vertebral artery ends in pica and does not contribute to the basilar.  There is a prominent right  AICA.  Superior cerebellar arteries are patent. Posterior cerebral arteries are patent bilaterally.  Internal carotid artery is widely patent.  Anterior middle cerebral arteries are patent bilaterally without stenosis.  Negative for aneurysm.  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  No significant intracranial stenosis.   Original Report Authenticated By: Janeece Riggers, M.D.     2D Echo:  09/07/12 Study Conclusions  - Left ventricle: The EF is probably normal. The cavity size was normal. Wall thickness was  increased in a pattern of mild LVH. Images were inadequate for LV wall motion assessment. - Aortic valve: Poorly visualized. The valve appears to be grossly normal. - Mitral valve: Poorly visualized. The valve appears to be grossly normal. - Impressions: Extremely limited study. Only the parasternal views are interpretable. No cardiac source of embolism was identified, but cannot be ruled out on the basis of this examination. Impressions:  - Extremely limited study. Only the parasternal views are interpretable. No cardiac source of embolism was identified, but cannot be ruled out on the basis of this examination.  Admission HPI:  [name] is a 52 year old woman with PMH of DM2, HTN, GERD, CAD, impingement of right shoulder syndrome, who comes in to the [facility] for evaluation of dizziness and left arm cold sensation. She states that in the past 5 days her appetite has been decreased as she had gastrointestinal  symptoms with diarrhea of 2 days, nausea, and decreased intake per mouth. She states that she took Imodium for her diarrhea and this resolved two days ago but she continues to have nausea and poor appetite. She has been lightheaded upon standing for 3-4 days but denies falls. Last night she had chest soreness, right shoulder soreness, and a sensation of a "cold" in her left forearm. These symptoms prevented her from sleeping well and she feels tired today. This morning she had a headache described as bilateral occipital headache that is currently present and described as a constant ache 10/10 in intensity with no photophobia, visual changes, or lacrimation.  She denies confusion, increased shortness of breath, abdominal pain, back pain, hematuria, dysuria, or diarrhea.   Hospital Course by problem list: Lightheadedness. Likely multifactorial with orthostatic hypotension due to recent diarrhea and decreased intake per mouth for 4-5 days to admission and chronic microvascular ischemia due to congenital hypoplasia of the posterior circulation.  Other etiologies such as ACS, CVA, elevated HTN, as well as worsening CHF were also considered. The Stroke Team was consulted and she received full work up for possible CVA. Her CT head without contrast showed no acute abnormalities, her MRI/MRA was remarkable for chronic microvascular ischemia, congenital hypoplasia of the posterior circulation but  no intracranial stenosis.She passed speech and swallow test and tolerated a carbohydrate modified diet well. Her 2 view CXR negative for edema or effusion and she remained hemodynamically stable with no signs of volume overload on physical exam, and her pro-BNP is low at 8.2, making worsening CHF less likely. Her 2D echo was a very limited exam, however, it did show probably normal EF. ACS was ruled out with Troponin x3 negative, no EKG changes and no complaint of chest pain. Her UDS was negative. Her lightheadedness slowly  improved with IV fluid rehydration. She was evaluated by PT with recommendation for [facility]. She will be discharged with [facility] PT and with equipment for safety transition to home care while she continued to recover from lightheadedness. She will also follow up at the [facility] for reassessment of her lightheadedness.   HTN. Her blood pressure was elevated to 153/85 upon her admission, down to 104/65 prior to her discharge. She is on home Coreg, Lisinopril, and Lasix which she was not certain she had taken on the day of her admission. We continued Coreg given history of CHF but held Lasix and Lisinopril in setting of orthostatic hypotension. We resumed Lisinopril and Lasix upon her discharge after she received adequate IV fluids for rehydration. She will follow up at the [facility]  for reassessment of her blood pressure and need for Lasix.   CHF. Stable with no worsening dyspnea, and no signs of volume overload on physical exam upon her presentation nor prior to her discharge. Her last 2D echo on 01/23/12 showed an EF of 35-40%, mild LVH, and diffuse hypokinesis. Her repeat 2D echo was a limited study but with probable normal EF. We continued Coreg but held Lasix and Lisinopril due to orthostatic hypotension. She received IV fluid rehydration during this hospitalization and will resume Lasix and Lisinopril upon her discharge.   Headache.This was present upon her admission but improved prior to her discharge. Her headache could have been secondary to lack of sleep, elevated BP, or migraine. She does not have history of migraine, however. Her Elavil 25mg  qHS was continued and she was given Ultram 50mg  PRN for the headache. She reported great improvement of her headache prior to her discharge.   Left forearm cold sensation and tenderness. Improved prior to her discharge. Etiology unclear, could be secondary to generalized MSK tenderness due to mild dehydration. Rhabdomyolysis was considered but her CK level was low  at 175, with UA with no hemoglobin or RBCs. Neurologic, sensory deficit was also considered but she had no evidence of CVA based on work up by Neurology, the Stroke Team. ACS with atypical presentation was also considered but she had no EKG changes with negative troponin x3. She will follow up at the [facility].   DM2. Hgb A1C of 9.2% 06/15/2012. She is on Lantus 40 units qHS at home but was given Lantus 30 units qHS with SSI during this hospitalization with CBGs of 200s. She will need outpatient follow up for better control of her diabetes.  - CAD. Stable. We continued statin therapy with Zocor 20mg , formulary for her home Pravachol 20mg . We also continued her ASA 81mg .   Discharge Vitals:  BP 104/65  Pulse 96  Temp(Src) 98.5 F (36.9 C) (Oral)  Resp 20  Ht 5\' 6"  (1.676 m)  Wt 262 lb 5.6 oz (119 kg)  BMI 42.36 kg/m2  SpO2 100%  LMP 02/13/2011  Discharge Labs:  Results for orders placed during the hospital encounter of 09/06/12 (from the past 24 hour(s))  GLUCOSE, CAPILLARY     Status: Abnormal   Collection Time    09/06/12  6:07 PM      Result Value Range   Glucose-Capillary 127 (*) 70 - 99 mg/dL   Comment 1 Documented in Chart     Comment 2 Notify RN    CK TOTAL AND CKMB     Status: None   Collection Time    09/06/12  6:25 PM      Result Value Range   Total CK 175  7 - 177 U/L   CK, MB 4.0  0.3 - 4.0 ng/mL   Relative Index 2.3  0.0 - 2.5  URINE RAPID DRUG SCREEN (HOSP PERFORMED)     Status: None   Collection Time    09/06/12  6:30 PM      Result Value Range   Opiates NONE DETECTED  NONE DETECTED   Cocaine NONE DETECTED  NONE DETECTED   Benzodiazepines NONE DETECTED  NONE DETECTED   Amphetamines NONE DETECTED  NONE DETECTED   Tetrahydrocannabinol NONE DETECTED  NONE DETECTED   Barbiturates NONE DETECTED  NONE DETECTED  COMPREHENSIVE METABOLIC PANEL     Status: Abnormal   Collection Time    09/06/12  6:31 PM      Result Value Range  Sodium 138  135 - 145 mEq/L   Potassium  3.9  3.5 - 5.1 mEq/L   Chloride 99  96 - 112 mEq/L   CO2 25  19 - 32 mEq/L   Glucose, Bld 132 (*) 70 - 99 mg/dL   BUN 20  6 - 23 mg/dL   Creatinine, Ser 1.61  0.50 - 1.10 mg/dL   Calcium 9.6  8.4 - 09.6 mg/dL   Total Protein 7.9  6.0 - 8.3 g/dL   Albumin 3.8  3.5 - 5.2 g/dL   AST 18  0 - 37 U/L   ALT 18  0 - 35 U/L   Alkaline Phosphatase 68  39 - 117 U/L   Total Bilirubin 0.1 (*) 0.3 - 1.2 mg/dL   GFR calc non Af Amer 74 (*) >90 mL/min   GFR calc Af Amer 85 (*) >90 mL/min  MAGNESIUM     Status: Abnormal   Collection Time    09/06/12  6:31 PM      Result Value Range   Magnesium 1.3 (*) 1.5 - 2.5 mg/dL  PRO B NATRIURETIC PEPTIDE     Status: None   Collection Time    09/06/12  6:31 PM      Result Value Range   Pro B Natriuretic peptide (BNP) 8.2  0 - 125 pg/mL  TROPONIN I     Status: None   Collection Time    09/06/12  6:31 PM      Result Value Range   Troponin I <0.30  <0.30 ng/mL  URINALYSIS, ROUTINE W REFLEX MICROSCOPIC     Status: None   Collection Time    09/06/12  7:06 PM      Result Value Range   Color, Urine YELLOW  YELLOW   APPearance CLEAR  CLEAR   Specific Gravity, Urine 1.016  1.005 - 1.030   pH 5.0  5.0 - 8.0   Glucose, UA NEGATIVE  NEGATIVE mg/dL   Hgb urine dipstick NEGATIVE  NEGATIVE   Bilirubin Urine NEGATIVE  NEGATIVE   Ketones, ur NEGATIVE  NEGATIVE mg/dL   Protein, ur NEGATIVE  NEGATIVE mg/dL   Urobilinogen, UA 0.2  0.0 - 1.0 mg/dL   Nitrite NEGATIVE  NEGATIVE   Leukocytes, UA NEGATIVE  NEGATIVE  GLUCOSE, CAPILLARY     Status: Abnormal   Collection Time    09/06/12  9:50 PM      Result Value Range   Glucose-Capillary 129 (*) 70 - 99 mg/dL  TROPONIN I     Status: None   Collection Time    09/06/12 11:36 PM      Result Value Range   Troponin I <0.30  <0.30 ng/mL  BASIC METABOLIC PANEL     Status: Abnormal   Collection Time    09/07/12  5:30 AM      Result Value Range   Sodium 136  135 - 145 mEq/L   Potassium 4.2  3.5 - 5.1 mEq/L   Chloride  99  96 - 112 mEq/L   CO2 28  19 - 32 mEq/L   Glucose, Bld 257 (*) 70 - 99 mg/dL   BUN 19  6 - 23 mg/dL   Creatinine, Ser 0.45  0.50 - 1.10 mg/dL   Calcium 9.1  8.4 - 40.9 mg/dL   GFR calc non Af Amer 65 (*) >90 mL/min   GFR calc Af Amer 75 (*) >90 mL/min  PROTIME-INR     Status: None   Collection Time  09/07/12  5:30 AM      Result Value Range   Prothrombin Time 13.0  11.6 - 15.2 seconds   INR 0.99  0.00 - 1.49  TROPONIN I     Status: None   Collection Time    09/07/12  5:30 AM      Result Value Range   Troponin I <0.30  <0.30 ng/mL  HEMOGLOBIN A1C     Status: Abnormal   Collection Time    09/07/12  8:10 AM      Result Value Range   Hemoglobin A1C 9.2 (*) <5.7 %   Mean Plasma Glucose 217 (*) <117 mg/dL  GLUCOSE, CAPILLARY     Status: Abnormal   Collection Time    09/07/12  8:49 AM      Result Value Range   Glucose-Capillary 215 (*) 70 - 99 mg/dL   Comment 1 Documented in Chart     Comment 2 Notify RN    LIPID PANEL     Status: Abnormal   Collection Time    09/07/12  9:10 AM      Result Value Range   Cholesterol 176  0 - 200 mg/dL   Triglycerides 811 (*) <150 mg/dL   HDL 49  >91 mg/dL   Total CHOL/HDL Ratio 3.6     VLDL 43 (*) 0 - 40 mg/dL   LDL Cholesterol 84  0 - 99 mg/dL  CBC     Status: Abnormal   Collection Time    09/07/12 10:56 AM      Result Value Range   WBC 8.7  4.0 - 10.5 K/uL   RBC 3.79 (*) 3.87 - 5.11 MIL/uL   Hemoglobin 10.5 (*) 12.0 - 15.0 g/dL   HCT 47.8 (*) 29.5 - 62.1 %   MCV 84.7  78.0 - 100.0 fL   MCH 27.7  26.0 - 34.0 pg   MCHC 32.7  30.0 - 36.0 g/dL   RDW 30.8  65.7 - 84.6 %   Platelets 250  150 - 400 K/uL  GLUCOSE, CAPILLARY     Status: Abnormal   Collection Time    09/07/12 12:02 PM      Result Value Range   Glucose-Capillary 225 (*) 70 - 99 mg/dL   Comment 1 Documented in Chart     Comment 2 Notify RN      Signed: Ky Barban 09/07/2012, 3:00 PM   Time Spent on Discharge: 45 minutes Services Ordered on Discharge:  PT Equipment Ordered on Discharge: bedside commode, rolling walker

## 2012-09-07 NOTE — Progress Notes (Signed)
VASCULAR LAB PRELIMINARY  PRELIMINARY  PRELIMINARY  PRELIMINARY  Transcranial Doppler completed.    Preliminary report:  TCD completed  Tri Chittick, RVS 09/07/2012, 1:41 PM

## 2012-09-07 NOTE — H&P (Signed)
Internal Medicine Teaching Service Attending Note Date: 09/07/2012  Patient name: [name]  Medical record number: [id number]  Date of birth: 10/13/1960    This patient has been seen and discussed with the house staff. Please see their note for complete details. I concur with their findings with the following additions/corrections: Patient is a 52 year old female with past medical history of diabetes, hypertension, coronary artery disease and reflux disease who was evaluated in the Clinic for dizziness and left arm cold sensation since last 2 days. Patient also endorsed decreased appetite. Patient has had nausea, loose stools since last 2 days. Patient was found to be orthostatic in the clinic and hence it was decided to put the patient for IV fluids and also to evaluate other causes of her dizziness. Patient had a headache described as bilateral occipital headache 10 out of 10 in intensity, no radiation, no aggravating or relieving factors at the time of history taking by residents. The headache is 0/10 at this time.  15 point review of system was negative except as noted above.  Past medical history, past surgical history, medications, social history and family history was reviewed and is as per resident's note.  BP 110/70  Pulse 89  Temp(Src) 99 F (37.2 C) (Oral)  Resp 20  Ht 5\' 6"  (1.676 m)  Wt 262 lb 5.6 oz (119 kg)  BMI 42.36 kg/m2  SpO2 95%  LMP 02/13/2011  Physical Exam: General: Vital signs reviewed and noted. Well-developed, well-nourished, in no acute distress; alert, appropriate and cooperative throughout examination.  Head: Normocephalic, atraumatic.  Eyes: PERRL, EOMI, No signs of anemia or jaundince.  Nose: Mucous membranes moist, not inflammed, nonerythematous.  Throat: Oropharynx nonerythematous, no exudate appreciated.   Neck: No deformities, masses, or tenderness noted.Supple, No carotid Bruits, no JVD.  Lungs:  Normal respiratory effort. Clear to auscultation  BL without crackles or wheezes.  Heart: RRR. S1 and S2 normal without gallop, murmur, or rubs.  Abdomen:  BS normoactive. Soft, Nondistended, non-tender.  No masses or organomegaly.  Extremities: No pretibial edema.  Neurologic: A&O X3, CN II - XII are grossly intact. Motor strength is 5/5 in the all 4 extremities, Sensations intact to light touch, Cerebellar signs negative.  Skin: No visible rashes, scars.   Labs and imaging studies were reviewed.  Assessment and plan: Patient is a 52 year old female with past medical history significant for diabetes, hypertension, hyperlipidemia and coronary artery disease who was admitted from the clinic after being found orthostatic. Patient's dizziness is most likely secondary to orthostasis but since patient is complaining of decreased sensation in her left arm I believe that a complete neurological workup was necessary to rule out transient ischemic attack versus stroke especially given her strong risk factor profile.  I missed the patient applies as she was getting her MRI done. Finally I was able to see the patient, all the tests that we have done were negative for any acute event. Patient was also evaluated by the stroke team and suggested that patient is ready for discharge at this time.   Patient got a complete neurological workup which included 2-D echocardiogram, carotid Dopplers, lipid panel, HbA1c and transcranial Doppler including MRI prior to her discharge.   Rest of the medical management was as per resident's note.  Lars Mage 09/07/2012, 11:43 PM

## 2012-09-07 NOTE — Progress Notes (Signed)
SLP Cancellation Note  Patient Details Name: [name] MRN: [id number] DOB: 02-17-1961   Cancelled treatment:        Bedside swallow evaluation ordered 3/13 1803, however pt. Passed RN swallow screen 3/13 at 1956.  No ST order needed.   Breck Coons Gray[employer]Ed [employer] [phone]  09/07/2012

## 2012-09-07 NOTE — Progress Notes (Signed)
VASCULAR LAB PRELIMINARY  PRELIMINARY  PRELIMINARY  PRELIMINARY  Carotid duplex completed.    Preliminary report:  Bilateral:  No evidence of hemodynamically significant internal carotid artery stenosis.  Right vertebral artery flow appears to be retrograde. Vertebral artery flow is antegrade on the left.     SLAUGHTER, [place], RVS 09/07/2012, 2:22 PM

## 2012-09-07 NOTE — Progress Notes (Signed)
PT Cancellation Note  Patient Details Name: [name] MRN: [id number] DOB: September 23, 1960   Cancelled Treatment:    Reason Eval/Treat Not Completed: Patient at procedure or test/unavailable   [facility] 09/07/2012, 2:08 PM

## 2012-09-07 NOTE — Progress Notes (Signed)
  Echocardiogram 2D Echocardiogram has been performed.  Cathie Beams 09/07/2012, 2:20 PM

## 2012-09-07 NOTE — Progress Notes (Signed)
Subjective: She continues to have dizziness but her left arm sensation is improving.  Objective: Vital signs in last 24 hours: Filed Vitals:   09/06/12 1956 09/06/12 1957 09/06/12 1958 09/07/12 0500  BP: 161/87 145/86 153/85 104/65  Pulse: 99   96  Temp: 98.6 F (37 C)   98.5 F (36.9 C)  TempSrc: Oral   Oral  Resp: 20   20  Height:      Weight:    262 lb 5.6 oz (119 kg)  SpO2: 98%   100%   Weight change:   Intake/Output Summary (Last 24 hours) at 09/07/12 1417 Last data filed at 09/07/12 0900  Gross per 24 hour  Intake    260 ml  Output      0 ml  Net    260 ml   Vitals reviewed.  General: Sitting in chair, in NAD, obese  HEENT: PERRL, no scleral icterus. Left eye with lateral deviation (chronic) at rest but otherwise EOM intact.  Cardiac: RRR, no rubs, murmurs or gallops but distant heart sounds secondary to body habitus  Pulm: clear to auscultation bilaterally, no wheezes, rales, or rhonchi  Abd: soft, nontender, nondistended, BS present  Ext: Warm and well perfused. 1+ pitting edema in bilateral ankles.  Neuro: alert and oriented X3, cranial nerves II-XII intact, left hand grip>right (she is left handed), left forearm sensation to fine touch (cotton ball) decreased compared to right, sensation equal and bilateral in LE, strength 5/5 in UE and LE. RAM intact. Shin-to-heel test intact. No Rhomberg present. Normal brachial and patellar reflex.  MSK: Chest tenderness upon gentle palpation, tenderness in left and right forearm with L>R. Tenderness in LE.  Lab Results: Basic Metabolic Panel:  Recent Labs Lab 09/06/12 1831 09/07/12 0530  NA 138 136  K 3.9 4.2  CL 99 99  CO2 25 28  GLUCOSE 132* 257*  BUN 20 19  CREATININE 0.88 0.98  CALCIUM 9.6 9.1  MG 1.3*  --    Liver Function Tests:  Recent Labs Lab 09/06/12 1831  AST 18  ALT 18  ALKPHOS 68  BILITOT 0.1*  PROT 7.9  ALBUMIN 3.8   CBC:  Recent Labs Lab 09/07/12 1056  WBC 8.7  HGB 10.5*  HCT 32.1*   MCV 84.7  PLT 250   Cardiac Enzymes:  Recent Labs Lab 09/06/12 1825 09/06/12 1831 09/06/12 2336 09/07/12 0530  CKTOTAL 175  --   --   --   CKMB 4.0  --   --   --   TROPONINI  --  <0.30 <0.30 <0.30   BNP:  Recent Labs Lab 09/06/12 1831  PROBNP 8.2   D-Dimer: No results found for this basename: DDIMER,  in the last 168 hours CBG:  Recent Labs Lab 09/06/12 1349 09/06/12 1807 09/06/12 2150 09/07/12 0849 09/07/12 1202  GLUCAP 136* 127* 129* 215* 225*   Hemoglobin A1C: No results found for this basename: HGBA1C,  in the last 168 hours Fasting Lipid Panel:  Recent Labs Lab 09/07/12 0910  CHOL 176  HDL 49  LDLCALC 84  TRIG 214*  CHOLHDL 3.6   Thyroid Function Tests: No results found for this basename: TSH, T4TOTAL, FREET4, T3FREE, THYROIDAB,  in the last 168 hours Coagulation:  Recent Labs Lab 09/07/12 0530  LABPROT 13.0  INR 0.99   Anemia Panel: No results found for this basename: VITAMINB12, FOLATE, FERRITIN, TIBC, IRON, RETICCTPCT,  in the last 168 hours Urine Drug Screen: Drugs of Abuse  Component Value Date/Time   LABOPIA NONE DETECTED 09/06/2012 1830   COCAINSCRNUR NONE DETECTED 09/06/2012 1830   LABBENZ NONE DETECTED 09/06/2012 1830   AMPHETMU NONE DETECTED 09/06/2012 1830   THCU NONE DETECTED 09/06/2012 1830   LABBARB NONE DETECTED 09/06/2012 1830    Alcohol Level: No results found for this basename: ETH,  in the last 168 hours Urinalysis:  Recent Labs Lab 09/06/12 1906  COLORURINE YELLOW  LABSPEC 1.016  PHURINE 5.0  GLUCOSEU NEGATIVE  HGBUR NEGATIVE  BILIRUBINUR NEGATIVE  KETONESUR NEGATIVE  PROTEINUR NEGATIVE  UROBILINOGEN 0.2  NITRITE NEGATIVE  LEUKOCYTESUR NEGATIVE   Studies/Results: X-ray Chest Pa And Lateral   09/07/2012  *RADIOLOGY REPORT*  Clinical Data: Chest pain, stroke, heart failure, hypertension, diabetes  CHEST - 2 VIEW  Comparison: 06/09/2012  Findings: Upper normal heart size. Normal mediastinal contours and  pulmonary vascularity. Lungs clear. No pleural effusion or pneumothorax. Bones unremarkable.  IMPRESSION: No acute abnormalities.   Original Report Authenticated By: Ulyses Southward, M.D.    Mr [facility] Wo Contrast  09/07/2012  *RADIOLOGY REPORT*  Clinical Data:  Stroke.  Dizziness  MRI HEAD WITHOUT CONTRAST MRA HEAD WITHOUT CONTRAST  Technique:  Multiplanar, multiecho pulse sequences of the brain and surrounding structures were obtained without intravenous contrast. Angiographic images of the head were obtained using MRA technique without contrast.  Comparison:   None  MRI HEAD  Findings:  Negative for acute infarct.  Chronic ischemic changes in the frontal white matter bilaterally.  Patchy hyperintensity in the pons bilaterally compatible with chronic ischemia.  Negative for intracranial mass or hemorrhage.  Ventricle size is normal.  IMPRESSION: Chronic microvascular ischemia.  No acute infarct.  MRA HEAD  Findings: Fetal origin of the posterior cerebral artery bilaterally.  The distal basilar is hypoplastic.  The   left vertebral artery and left PICA are patent.  Right vertebral artery ends in pica and does not contribute to the basilar.  There is a prominent right  AICA.  Superior cerebellar arteries are patent. Posterior cerebral arteries are patent bilaterally.  Internal carotid artery is widely patent.  Anterior middle cerebral arteries are patent bilaterally without stenosis.  Negative for aneurysm.  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  No significant intracranial stenosis.   Original Report Authenticated By: Janeece Riggers, M.D.    Mr Brain Wo Contrast  09/07/2012  *RADIOLOGY REPORT*  Clinical Data:  Stroke.  Dizziness  MRI HEAD WITHOUT CONTRAST MRA HEAD WITHOUT CONTRAST  Technique:  Multiplanar, multiecho pulse sequences of the brain and surrounding structures were obtained without intravenous contrast. Angiographic  images of the head were obtained using MRA technique without contrast.  Comparison:   None  MRI HEAD  Findings:  Negative for acute infarct.  Chronic ischemic changes in the frontal white matter bilaterally.  Patchy hyperintensity in the pons bilaterally compatible with chronic ischemia.  Negative for intracranial mass or hemorrhage.  Ventricle size is normal.  IMPRESSION: Chronic microvascular ischemia.  No acute infarct.  MRA HEAD  Findings: Fetal origin of the posterior cerebral artery bilaterally.  The distal basilar is hypoplastic.  The   left vertebral artery and left PICA are patent.  Right vertebral artery ends in pica and does not contribute to the basilar.  There is a prominent right  AICA.  Superior cerebellar arteries are patent. Posterior cerebral arteries are patent bilaterally.  Internal carotid artery is widely patent.  Anterior middle cerebral arteries are patent bilaterally  without stenosis.  Negative for aneurysm.  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  No significant intracranial stenosis.   Original Report Authenticated By: Janeece Riggers, M.D.    Medications: I have reviewed the patient's current medications. Scheduled Meds: . amitriptyline  25 mg Oral QHS  . aspirin EC  81 mg Oral Daily  . carvedilol  6.25 mg Oral BID WC  . dorzolamide-timolol  1 drop Right Eye BID  . ferrous sulfate  325 mg Oral Q breakfast  . gabapentin  600 mg Oral TID  . heparin  5,000 Units Subcutaneous Q8H  . insulin aspart  0-5 Units Subcutaneous QHS  . insulin aspart  0-9 Units Subcutaneous TID WC  . insulin glargine  30 Units Subcutaneous QHS  . pantoprazole  40 mg Oral Daily  . simvastatin  20 mg Oral q1800  . sodium chloride  3 mL Intravenous Q12H  . sodium chloride  3 mL Intravenous Q12H   Continuous Infusions: . sodium chloride 1,000 mL (09/07/12 1215)   PRN Meds:.sodium chloride, acetaminophen,  acetaminophen, cyclobenzaprine, promethazine, sodium chloride, traMADol Assessment/Plan: Lightheadedness. Most likely etiology is orthostatic hypotension given report of recent diarrhea and decreased intake per mouth for the past 4-5 days. ACS, TIA (though unlikely given no Neurologic changes ), elevated HTN, as well as worsening CHF( although she appears hypovolemic on physical exam) are also possible. Per Neurology, full work up for TIA recommended. CT head without contrast with no acute abnormalities, MRI/MRA with chronic microvascular ischemia, congenital hypoplasia of the posterior circulation no intracranial stenosis. 2 view CXR negative for edema or effusion. She passed speech and swallow test and is now on a carb mod diet. Troponin x3 negative. EKG pending. UDS negative.  -Inpatient Observation  -Fall precaution  -UDS  -Phenergan PRN for nausea  -Protonix PO  -Neuro checks  -Strict I&O  -Daily weights   HTN. BP elevated to 156/89 down to 104/65. She is on home Coreg, Lisinopril, and Lasix. Will continue Coreg at this time given history of CHF.  -Coreg 6.25mg  BID   CHF. Stable with no worsening dyspnea, and no signs of volume overload on physical exam. Last 2D echo on 01/23/12 with EF of 35-40%, mild LVH, diffuse hypokinesis. Will continue Coreg but hold Lasix and Lisinopril with orthostatic hypotension.   Headache. Could be secondary to lack of sleep, AKI, elevated BP, or migraine. She does not have history of migraine, however.  -Tylenol PRN  -Ultram 50mg  q6hr PRN for pain  -Continue Elavil 25mg  qHS   Left forearm cold sensation and tenderness. Could be secondary to generalized MSK, ACS, or rhabdomyolysis. CK of 175, UA negative for UTI, ketone, or proteinuria.  -Neurology consulted, appreciate recommendations  -Continue flexeril, gabapentin    DM2. Last Hgb A1C of 8.9% in 06/15/2012. She is on Lantus 40 units qHS.  -CBGs q4h  -Lantus 30 units qHS  -SSI sensitive   CAD.  Stable. Continue statin, Zocor 20mg  formulary for her home Pravachol 20mg . ASA 81mg .   DVT prophylaxis: Heparin TID   Dispo: Disposition is deferred at this time, awaiting improvement of current medical problems. Anticipated discharge in approximately 1-2 day(s).  The patient does have a current PCP (SCHOOLER, KAREN, MD), therefore will not be requiring OPC follow-up after discharge.  The patient does not have transportation limitations that hinder transportation to clinic appointments.   .Services Needed at time of discharge: Y = Yes, Blank = No PT:  OT:   RN:   Equipment:   Other:     LOS: 1 day   Sara Chu D 09/07/2012, 2:17 PM

## 2012-09-07 NOTE — Progress Notes (Signed)
[facility]  Patient Status: New  [facility] is providing the following services: PT and OT  If patient discharges after hours, please call [phone].   [name] 09/07/2012, 4:33 PM

## 2012-09-07 NOTE — Care Management Note (Signed)
    Page 1 of 2   09/07/2012     4:25:33 PM   CARE MANAGEMENT NOTE 09/07/2012  Patient:  [name], [name]   Account Number:  [id number]  Date Initiated:  09/07/2012  Documentation initiated by:  Letha Cape  Subjective/Objective Assessment:   dx lightheadedness  admit as observation     Action/Plan:   Anticipated DC Date:  09/07/2012   Anticipated DC Plan:  HOME/SELF CARE      DC Planning Services  CM consult      [facility] Choice  HOME HEALTH   Choice offered to / List presented to:  C-1 Patient   DME arranged  WALKER - ROLLING  BEDSIDE COMMODE      DME agency  [facility]     HH arranged  HH-2 PT  HH-3 OT      [facility] agency  [facility]   Status of service:  Completed, signed off Medicare Important Message given?   (If response is "NO", the following Medicare IM given date fields will be blank) Date Medicare IM given:   Date Additional Medicare IM given:    Discharge Disposition:  HOME/SELF CARE  Per UR Regulation:  Reviewed for med. necessity/level of care/duration of stay  If discussed at Long Length of Stay Meetings, dates discussed:    Comments:  09/07/12 16:14 Letha Cape RN, BSN [phone] patient lives alone.   Patient has an aid with [facility]. Per physical therapy recs hhpt/ot, patient chose [facility] from agency list, referral made to [facility], Lupita Leash notified, Jill Alexanders notified for rolling walker and  3 n 1.  Soc will begin 24-48 hrs post discharge.  Patient for dc today.

## 2012-09-07 NOTE — Evaluation (Signed)
Physical Therapy Evaluation Patient Details Name: [name] MRN: [id number] DOB: [birth year] Today's Date: 09/07/2012 Time: 1478-2956 PT Time Calculation (min): 15 min  PT Assessment / Plan / Recommendation Clinical Impression  Pt adm with dizziness and lt arm numbness.  Needs skilled PT to maximize I and safety so pt can return to her prior level of function.  Pt's daughter can provide 24 hour assist.  Pt with questionable nystagmus with head turns sitting EOB.  Recommend HHPT/HHOT as well as rolling walker and 3-in-1 commode.    PT Assessment  Patient needs continued PT services    Follow Up Recommendations  Home health PT;Supervision/Assistance - 24 hour    Does the patient have the potential to tolerate intense rehabilitation      Barriers to Discharge        Equipment Recommendations  Rolling walker with 5" wheels;Other (comment) (3-in-1 commode)    Recommendations for Other Services     Frequency Min 4X/week    Precautions / Restrictions Precautions Precautions: Fall   Pertinent Vitals/Pain No c/o's      Mobility  Transfers Transfers: Sit to Stand;Stand to Sit Sit to Stand: 6: Modified independent (Device/Increase time);With upper extremity assist;From bed Stand to Sit: 6: Modified independent (Device/Increase time);With upper extremity assist;To bed Ambulation/Gait Ambulation/Gait Assistance: 4: Min guard;3: Mod assist Ambulation Distance (Feet): 100 Feet (100' x 2) Assistive device: Rolling walker Ambulation/Gait Assistance Details: Verbal cues to stay closer to walker.  Pt with one major loss of balance requiring mod A to correct. Gait Pattern: Step-through pattern;Decreased stride length;Lateral hip instability Gait velocity: decr    Exercises     PT Diagnosis: Difficulty walking  PT Problem List: Decreased activity tolerance;Decreased balance;Decreased mobility;Decreased knowledge of use of DME PT Treatment Interventions: Gait training;DME  instruction;Functional mobility training;Stair training;Therapeutic activities;Therapeutic exercise;Balance training;Patient/family education   PT Goals Acute Rehab PT Goals PT Goal Formulation: With patient Time For Goal Achievement: 09/14/12 Potential to Achieve Goals: Good Pt will Ambulate: 51 - 150 feet;with supervision PT Goal: Ambulate - Progress: Goal set today Pt will Go Up / Down Stairs: 3-5 stairs;with min assist;with rail(s) PT Goal: Up/Down Stairs - Progress: Goal set today  Visit Information  Last PT Received On: 09/07/12 Assistance Needed: +1 Reason Eval/Treat Not Completed: Patient at procedure or test/unavailable    Subjective Data  Subjective: Pt states she has been falling at home the past week. Patient Stated Goal: Not fall   Prior Functioning  Home Living Lives With: Alone Available Help at Discharge:  (aide 2 hours/day) Type of Home: House Home Access: Stairs to enter [employer] of Steps: 6-8 Entrance Stairs-Rails: Right;Left;Can reach both Home Layout: Two level;Laundry or work area in Artist of Steps: flight Bathroom Shower/Tub: Network engineer: None    Cognition  Cognition Overall Cognitive Status: Appears within functional limits for tasks assessed/performed Arousal/Alertness: Awake/alert Orientation Level: Appears intact for tasks assessed Behavior During Session: [name] for tasks performed    Extremity/Trunk Assessment Right Lower Extremity Assessment RLE ROM/Strength/Tone: [name] for tasks assessed Left Lower Extremity Assessment LLE ROM/Strength/Tone: [name] for tasks assessed   Balance Balance Balance Assessed: Yes Static Standing Balance Static Standing - Balance Support: Bilateral upper extremity supported Static Standing - Level of Assistance: 5: Stand by assistance  End of Session PT - End of Session Equipment Utilized During Treatment: Gait  belt Activity Tolerance: Patient limited by fatigue Patient left: in bed;with call bell/phone within reach;with family/visitor present Nurse  Communication: Mobility status  GP Functional Assessment Tool Used: clinical judgement Functional Limitation: Mobility: Walking and moving around Mobility: Walking and Moving Around Current Status [phone]): At least 1 percent but less than 20 percent impaired, limited or restricted Mobility: Walking and Moving Around Goal Status [phone]): 0 percent impaired, limited or restricted   [name] 09/07/2012, 4:05 PM  [name] PT [phone]

## 2012-09-07 NOTE — Progress Notes (Signed)
Stroke Team Progress Note  HISTORY [name] is an 52 y.o. female with a past medical history remarkable for hypertension, hyperlipidemia, diabetes for almost 30 years, diabetic neuropathy, non obstructive CAD, CHF, GERD, and new onset dizziness, dysequilibrium, and left arm numbness.  Stated that the beginning of this week she started noticing " numbness of the fingertips left hand that then progressed to the left arm" but without face or leg lower extremity involvement. Then, approximately the next days she started having off and on episodes of " spinning and lightheadedness" lasting for a couple of minutes associated with troubles with her balance and the sensation that she can easily fall. No nausea, vomiting, double vision, difficulty swallowing, focal weakness, slurred speech, vision or language impairment. Denies tinnitus, head or neck trauma but daid that standing up makes the dizziness worse.  [name] admits to infrequent episodes of dizziness in the past " but nothing to this extent".  Date last known well: Unable to determine Time last known well: Unable to determine  Patient was not a TPA candidate secondary to unknown time of onset. She was admitted for further evaluation and treatment.  SUBJECTIVE No family is at the bedside.  Overall she feels her condition is gradually improving. She reports frequent diarrhea. She has no  OBJECTIVE Most recent Vital Signs: Filed Vitals:   09/06/12 1956 09/06/12 1957 09/06/12 1958 09/07/12 0500  BP: 161/87 145/86 153/85 104/65  Pulse: 99   96  Temp: 98.6 F (37 C)   98.5 F (36.9 C)  TempSrc: Oral   Oral  Resp: 20   20  Height:      Weight:    119 kg (262 lb 5.6 oz)  SpO2: 98%   100%   CBG (last 3)   Recent Labs  09/06/12 1807 09/06/12 2150 09/07/12 0849  GLUCAP 127* 129* 215*    IV Fluid Intake:     MEDICATIONS  . amitriptyline  25 mg Oral QHS  . aspirin EC  81 mg Oral Daily  . carvedilol  6.25 mg Oral BID WC   . dorzolamide-timolol  1 drop Right Eye BID  . ferrous sulfate  325 mg Oral Q breakfast  . gabapentin  600 mg Oral TID  . heparin  5,000 Units Subcutaneous Q8H  . insulin aspart  0-5 Units Subcutaneous QHS  . insulin aspart  0-9 Units Subcutaneous TID WC  . insulin glargine  30 Units Subcutaneous QHS  . pantoprazole  40 mg Oral Daily  . simvastatin  20 mg Oral q1800  . sodium chloride  3 mL Intravenous Q12H  . sodium chloride  3 mL Intravenous Q12H   PRN:  sodium chloride, acetaminophen, acetaminophen, cyclobenzaprine, promethazine, sodium chloride, traMADol  Diet:  Carb Control thin liquids Activity:  Bedrest DVT Prophylaxis:  Heparin 5000 units sq tid  CLINICALLY SIGNIFICANT STUDIES Basic Metabolic Panel:  Recent Labs Lab 09/06/12 1831 09/07/12 0530  NA 138 136  K 3.9 4.2  CL 99 99  CO2 25 28  GLUCOSE 132* 257*  BUN 20 19  CREATININE 0.88 0.98  CALCIUM 9.6 9.1  MG 1.3*  --    Liver Function Tests:  Recent Labs Lab 09/06/12 1831  AST 18  ALT 18  ALKPHOS 68  BILITOT 0.1*  PROT 7.9  ALBUMIN 3.8   CBC: No results found for this basename: WBC, NEUTROABS, HGB, HCT, MCV, PLT,  in the last 168 hours Coagulation:  Recent Labs Lab 09/07/12 0530  LABPROT 13.0  INR 0.99   Cardiac Enzymes:  Recent Labs Lab 09/06/12 1825 09/06/12 1831 09/06/12 2336 09/07/12 0530  CKTOTAL 175  --   --   --   CKMB 4.0  --   --   --   TROPONINI  --  <0.30 <0.30 <0.30   Urinalysis:  Recent Labs Lab 09/06/12 1906  COLORURINE YELLOW  LABSPEC 1.016  PHURINE 5.0  GLUCOSEU NEGATIVE  HGBUR NEGATIVE  BILIRUBINUR NEGATIVE  KETONESUR NEGATIVE  PROTEINUR NEGATIVE  UROBILINOGEN 0.2  NITRITE NEGATIVE  LEUKOCYTESUR NEGATIVE   Lipid Panel    Component Value Date/Time   CHOL 148 10/29/2011 0610   TRIG 102 10/29/2011 0610   HDL 54 10/29/2011 0610   CHOLHDL 2.7 10/29/2011 0610   VLDL 20 10/29/2011 0610   LDLCALC 74 10/29/2011 0610   HgbA1C  Lab Results  Component Value Date   HGBA1C  8.9 06/15/2012    Urine Drug Screen:     Component Value Date/Time   LABOPIA NONE DETECTED 09/06/2012 1830   COCAINSCRNUR NONE DETECTED 09/06/2012 1830   LABBENZ NONE DETECTED 09/06/2012 1830   AMPHETMU NONE DETECTED 09/06/2012 1830   THCU NONE DETECTED 09/06/2012 1830   LABBARB NONE DETECTED 09/06/2012 1830    Alcohol Level: No results found for this basename: ETH,  in the last 168 hours     CT of the brain  09/07/2012   No acute abnormalities.   MRI of the brain  09/07/2012  Chronic microvascular ischemia.  No acute infarct.   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  No significant intracranial stenosis.   2D Echocardiogram    Carotid Doppler    CXR    EKG  sinus bradycardia.   Therapy Recommendations   Physical Exam   Obese middle aged african american lady not in distress.Awake alert. Afebrile. Head is nontraumatic. Neck is supple without bruit. Hearing is normal. Cardiac exam no murmur or gallop. Lungs are clear to auscultation. Distal pulses are well felt. Neurological Exam ; Awake  Alert oriented x 3. Normal speech and language.eye movements full without nystagmus.fundi not visualized. Visual acuity and fields appear normal Face symmetric. Tongue midline. Normal strength, tone, reflexes and coordination. Normal sensation. Gait deferred. ASSESSMENT [name] is a 52 y.o. female presenting with dizziness in the setting of hypoplastic posterior circulation while dehydrated following diarrhea. Imaging confirms no acute infarct. Posterior circulation dizziness most likely secondary to dehydration, possible TIA. On aspirin 81 mg orally every day prior to admission. Now on aspirin 81 mg orally every day for secondary stroke prevention. Patient with resultant dizziness. Full stroke work up recommended.   Hypertension Hyperlipidemia, LDL pending, on statin PTA, on statin  now, goal LDL < 100 Diabetes, HgbA1c pending Morbid obesity, Body mass index is 42.36 kg/(m^2). CAD  Hospital day # 1  TREATMENT/PLAN  Continue aspirin 81 mg orally every day for secondary stroke prevention.  Add IVF for hydration to assist in resolving dizziness  Complete stroke workup: 2D, carotid doppler, TCD, lipid panel, HgbA1c  OOB. Therapy evals with recommendations for home care  Annie Main, MSN, RN, ANVP-BC, ANP-BC, [facility] Pager: [phone] 09/07/2012 9:59 AM  I have personally obtained a history, examined the patient, evaluated imaging results, and formulated the assessment and plan of care. I agree with the above.  Delia Heady, MD

## 2012-09-09 ENCOUNTER — Other Ambulatory Visit: Payer: Self-pay | Admitting: Internal Medicine

## 2012-09-13 ENCOUNTER — Encounter: Payer: Self-pay | Admitting: Internal Medicine

## 2012-09-13 ENCOUNTER — Ambulatory Visit (INDEPENDENT_AMBULATORY_CARE_PROVIDER_SITE_OTHER): Payer: Medicare Other | Admitting: Internal Medicine

## 2012-09-13 VITALS — BP 96/58 | HR 105 | Temp 97.7°F | Ht 66.0 in | Wt 265.4 lb

## 2012-09-13 DIAGNOSIS — I509 Heart failure, unspecified: Secondary | ICD-10-CM

## 2012-09-13 DIAGNOSIS — R42 Dizziness and giddiness: Secondary | ICD-10-CM

## 2012-09-13 DIAGNOSIS — I10 Essential (primary) hypertension: Secondary | ICD-10-CM

## 2012-09-13 DIAGNOSIS — E1142 Type 2 diabetes mellitus with diabetic polyneuropathy: Secondary | ICD-10-CM

## 2012-09-13 DIAGNOSIS — I5020 Unspecified systolic (congestive) heart failure: Secondary | ICD-10-CM

## 2012-09-13 DIAGNOSIS — E1149 Type 2 diabetes mellitus with other diabetic neurological complication: Secondary | ICD-10-CM

## 2012-09-13 DIAGNOSIS — R51 Headache: Secondary | ICD-10-CM

## 2012-09-13 DIAGNOSIS — E1140 Type 2 diabetes mellitus with diabetic neuropathy, unspecified: Secondary | ICD-10-CM

## 2012-09-13 DIAGNOSIS — E1165 Type 2 diabetes mellitus with hyperglycemia: Secondary | ICD-10-CM

## 2012-09-13 LAB — COMPREHENSIVE METABOLIC PANEL
ALT: 12 U/L (ref 0–35)
Albumin: 4.2 g/dL (ref 3.5–5.2)
CO2: 27 mEq/L (ref 19–32)
Chloride: 99 mEq/L (ref 96–112)
Glucose, Bld: 223 mg/dL — ABNORMAL HIGH (ref 70–99)
Potassium: 4.6 mEq/L (ref 3.5–5.3)
Sodium: 138 mEq/L (ref 135–145)
Total Protein: 7.2 g/dL (ref 6.0–8.3)

## 2012-09-13 LAB — GLUCOSE, CAPILLARY: Glucose-Capillary: 204 mg/dL — ABNORMAL HIGH (ref 70–99)

## 2012-09-13 MED ORDER — FUROSEMIDE 40 MG PO TABS: 20.0000 mg | ORAL_TABLET | Freq: Every day | ORAL | Status: DC

## 2012-09-13 MED ORDER — GABAPENTIN 600 MG PO TABS: 300.0000 mg | ORAL_TABLET | Freq: Two times a day (BID) | ORAL | Status: DC

## 2012-09-13 MED ORDER — INSULIN GLARGINE 100 UNIT/ML ~~LOC~~ SOLN: 44.0000 [IU] | Freq: Every day | SUBCUTANEOUS | Status: DC

## 2012-09-13 NOTE — Assessment & Plan Note (Addendum)
S/p hospital discharge on 09/07/12 after admission for possible CVA ruled out by MRI head.  MRI did show Chronic microvascular ischemia. 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 No significant intracranial stenosis.  ACS ruled out as well.  UDS was negative.  Unknown etiology but likely multifactorial possibly secondary to multiple pain medications, dehydration in the setting of diuresis with lasix, poor diabetic control, and poor vision especially limited peripheral vision.  In the hospital, her lightheadedness improved with IVF rehydration.  Not orthostatic with vitals signs today.  Tachycardia remains--mild.  Gait instability noted on neuro exam but possibly secondary to very limited peripheral vision wit hx of glaucoma.  Associated with posterior headache, intermittent.  Was doing well s/p hospital discharge until today.  Currently denies dizziness.    -will decrease lasix from 40mg  to 20mg  daily -will increase lantus from 40 units to 44 units qhs -will decrease gabapentin to 300mg  po bid -follow up with Dr. Bebe Shaggy -recommended not to drive unless cleared by optho -d/c phenergan as she is not using that -follow up clinic 1 month or sooner if no improvement -f/u cmet -continue iron supplementation

## 2012-09-13 NOTE — Assessment & Plan Note (Signed)
Lab Results  Component Value Date   HGBA1C 9.2* 09/07/2012   HGBA1C 8.9 06/15/2012   HGBA1C 9.4 01/09/2012    Assessment:  Diabetes control: poor control (HgbA1C >9%)  Progress toward A1C goal:  deteriorated  Comments: poor control, will increase basal insulin lantus dose to 44 units today.  Plan:  Medications:  Continue current medications: novolog 4-6 units TID, increase Lantus to 44 units qhs, and continue metformin 1000mg  po bid  Home glucose monitoring:   Frequency: 2 times a day   Timing: before meals  Instruction/counseling given: needs to get eye exam as vision seems to have deteriorated per her report of past exam, foot exams, bring in meter to next visit in 1 month  Educational resources provided: brochure  Self management tools provided: other (see comments) ("Living Well with Diabetes")  Other plans: follow up in 1 month

## 2012-09-13 NOTE — Assessment & Plan Note (Signed)
Was started on lisinopril, lasix and coreg by Dr. Excell Seltzer.

## 2012-09-13 NOTE — Progress Notes (Signed)
Subjective:   Patient ID: [name] female   DOB: 1960/10/09 52 y.o.   MRN: [id number]  HPI: [name] is a 52 y.o. African American female with PMH of HTN, DM2 (Poorly controlled), glaucoma, CAD with systolic CHF and nonischemic cardiomyopathy with LVEF 35-40% per Dr. Randolm Idol note in 04/2012 presenting to clinic today for hospital follow up visit s/p discharge from [facility] on 09/08/11 for rule out CVA.  [name] presented with similar complaints of dizziness on 09/06/12 to clinic and was subsequently admitted at that time.  MRI/MRA head negative for acute stroke and light-headedness improved with IVF hydration.    Today, she explains that post discharge from the hospital she was doing well but then today developed a posterior right sided headache and subsequently has been light-headed since then.  She denies the sensation of the room spinning at this time but feels like she needs to hold to things while walking due to light-headedness.  She also has occasional nausea that has improved with phenergan in the past but had not gotten it refilled since discharge.    Of note, during recent hospitalization, [name] was noted to be orthostatic, however, today's vital signs did not show orthostasis.     Past Medical History  Diagnosis Date  . Hypertension   . Hyperlipidemia   . GERD (gastroesophageal reflux disease)   . Adhesive capsulitis of shoulder     bilateral, Steroid injection Dr. Nedra Hai 1/12 bilaterally  . Peripheral neuropathy   . Adhesive capsulitis of right shoulder     Steroid injection Dr. Nedra Hai 1/12  . Adhesive capsulitis of left shoulder     Steroid injection Dr. Nedra Hai 1/12  . Obesity   . Diabetic retinopathy(362.0)   . Glaucoma(365)   . Diabetes mellitus     Type II, insulin dependent  . CAD (coronary artery disease)     nonobstructive. Last cardiac cath (2008) showing left circumflex with mid 50% stenosis and distal luminal irregularities. Also with RCA with mid to  distal 30-40% stenosis. // Previously evaluated by [facility], never followed up outpatient.  . CHF (congestive heart failure)    Current Outpatient Prescriptions  Medication Sig Dispense Refill  . amitriptyline (ELAVIL) 25 MG tablet TAKE ONE TABLET BY MOUTH AT BEDTIME  30 tablet  0  . aspirin 81 MG tablet Take 1 tablet (81 mg total) by mouth daily.  30 tablet  11  . carvedilol (COREG) 6.25 MG tablet Take 1 tablet (6.25 mg total) by mouth 2 (two) times daily.  60 tablet  11  . dorzolamide-timolol (COSOPT) 22.3-6.8 MG/ML ophthalmic solution Place 1 drop into the right eye 2 (two) times daily.      . ferrous sulfate 325 (65 FE) MG tablet Take 1 tablet (325 mg total) by mouth daily with breakfast.  30 tablet  11  . furosemide (LASIX) 40 MG tablet Take 0.5 tablets (20 mg total) by mouth daily.  30 tablet  1  . gabapentin (NEURONTIN) 600 MG tablet Take 0.5 tablets (300 mg total) by mouth 2 (two) times daily.  90 tablet  6  . insulin aspart (NOVOLOG) 100 UNIT/ML injection Inject 4-6 Units into the skin 3 (three) times daily before meals. 4 units in the morning and 6 units in the evening      . insulin glargine (LANTUS) 100 UNIT/ML injection Inject 0.44 mLs (44 Units total) into the skin at bedtime.  10 mL  12  . lisinopril (PRINIVIL,ZESTRIL) 20 MG tablet Take  10 mg by mouth daily.       . metFORMIN (GLUCOPHAGE) 1000 MG tablet 1,000 mg 2 (two) times daily with a meal.       . omeprazole (PRILOSEC) 20 MG capsule Take 1 capsule (20 mg total) by mouth daily.  30 capsule  11  . pravastatin (PRAVACHOL) 20 MG tablet Take 1 tablet (20 mg total) by mouth daily.  30 tablet  6  . traMADol (ULTRAM) 50 MG tablet Take 1 tablet (50 mg total) by mouth every 6 (six) hours as needed. For pain  30 tablet  6  . [DISCONTINUED] gabapentin (NEURONTIN) 600 MG tablet Take 1 tablet (600 mg total) by mouth 3 (three) times daily.  90 tablet  3   No current facility-administered medications for this visit.   Family  History  Problem Relation Age of Onset  . Hypertension Sister   . Hypertension Sister   . Hypertension Sister    History   Social History  . Marital Status: Single    Spouse Name: N/A    Number of Children: 2  . Years of Education: 12th grade   Occupational History  . 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    Social History Main Topics  . Smoking status: Former Smoker -- 0.30 packs/day for 7 years    Types: Cigarettes    Quit date: 06/27/2006  . Smokeless tobacco: Never Used  . Alcohol Use: No  . Drug Use: No  . Sexually Active: Not Currently   Other Topics Concern  . None   Social History Narrative   Lives with her kids, 28yo daughter and 26 yo adopted son.   Review of Systems: Constitutional: Denies fever, chills, diaphoresis, appetite change and fatigue.  HEENT: poor vision, especially limited peripheral vision, mild photophobia with headaches.   Denies eye pain, redness, hearing loss, ear pain, congestion, sore throat, rhinorrhea, sneezing, mouth sores, trouble swallowing, neck pain, neck stiffness and tinnitus.   Respiratory: Denies SOB, DOE, cough, chest tightness,  and wheezing.   Cardiovascular: Denies chest pain, palpitations and leg swelling.  Gastrointestinal: Denies nausea, vomiting, abdominal pain, diarrhea, constipation, blood in stool and abdominal distention.  Genitourinary: Denies dysuria, urgency, frequency, hematuria, flank pain and difficulty urinating.  Musculoskeletal: back pain.  Denies myalgias, joint swelling, arthralgias and gait problem.  Skin: Denies pallor, rash and wound.  Neurological:  light-headedness and headaches.  Denies dizziness, seizures, syncope, weakness, numbness.  Hematological: Denies adenopathy. Easy bruising, personal or family bleeding history  Psychiatric/Behavioral: Denies suicidal ideation, mood changes, confusion, nervousness, sleep disturbance and  agitation  Objective:  Physical Exam: Filed Vitals:   09/13/12 1431 09/13/12 1444 09/13/12 1446 09/13/12 1448  BP: 109/60 106/58 103/67 96/58  Pulse: 101 101 106 105  Temp: 97.7 F (36.5 C)     TempSrc: Oral     Height: 5\' 6"  (1.676 m)     Weight: 265 lb 6.4 oz (120.385 kg)     SpO2: 98%      Constitutional: Vital signs reviewed.  Patient is a well-developed and well-nourished female in no acute distress and cooperative with exam. Alert and oriented x3.  Head: Normocephalic and atraumatic Ear: TM normal bilaterally Mouth: no erythema or exudates, MMM Eyes: PERRL, EOMI, conjunctivae normal, No scleral icterus. limited peripheral vision, especially on right.  Blurriness reported on far vision.   Neck: Supple, Trachea midline normal ROM Cardiovascular: RRR, S1 normal, S2 normal, no  MRG, pulses symmetric and intact bilaterally Pulmonary/Chest: CTAB, no wheezes, rales, or rhonchi Abdominal: Soft. Non-tender, obese, non-distended, bowel sounds are normal, no masses, organomegaly, or guarding present.  GU: no CVA tenderness Musculoskeletal: No joint deformities, erythema, or stiffness, ROM full and nontender.  +1 pitting edema b/l lower extremities. Hematology: no cervical, inginal, or axillary adenopathy.  Neurological: A&O x3, Strength is normal and symmetric bilaterally, cranial nerve II-XII are grossly intact, no focal motor deficit, sensory intact to light touch bilaterally.  Gait unsteady, complaints of light-headedness on standing and moving head down and worse with movement to right.  VERY limited peripheral vision, worse on right than left.    Skin: Warm, dry and intact. No rash, cyanosis, or clubbing.  Psychiatric: Normal mood and affect. speech and behavior is normal. Judgment and thought content normal. Cognition and memory are normal.   Assessment & Plan:  Discussed with Dr. Kem Kays -lightheadedness: multifactorial: poor vision, poor diabetes, polypharmacy.  MRI negative for acute  CVA.

## 2012-09-13 NOTE — Assessment & Plan Note (Signed)
Intermittent, posterior right sided at this time.  Associated with mild nausea and feeling light-headed.  Similar complaints that led to admission on 09/06/12.  MRI head negative to acute ischemia.  Hx of migraines.    -f/u opthalmology for vision analysis -decrease polypharmacy -improve glucose control -follow up home health physical therapy -follow up in clinic 1 month or sooner if no improvement

## 2012-09-13 NOTE — Patient Instructions (Addendum)
General Instructions: Drop your lasix dose to 20mg  by mouth daily  Please go see your opthalmologist as soon as possible and DO NOT DRIVE UNLESS YOU ARE CLEARED TO DRIVE BY HIM  We have decreased your dose of gabapentin to 300mg  by mouth twice daily  Please follow up with Dr. Copper  We have increased your lantus dose to 44 units at bed time  Please check your blood sugar three times a day and bring your meter to your next visit  Please return to clinic in 1 month  If your light-headedness gets worse, call or come to clinic [phone] or if severe, go to ED  Treatment Goals:  Goals (1 Years of Data) as of 09/13/12         As of Today As of Today As of Today As of Today 09/07/12     Blood Pressure    . Blood Pressure < 140/90  96/58 103/67 106/58 109/60 110/70     Result Component    . HEMOGLOBIN A1C < 7.0      9.2    . LDL CALC < 100      84     Progress Toward Treatment Goals:  Treatment Goal 09/13/2012  Hemoglobin A1C deteriorated  Blood pressure at goal    Self Care Goals & Plans:  Self Care Goal 09/06/2012  Manage my medications take my medicines as prescribed; bring my medications to every visit; refill my medications on time  Monitor my health keep track of my blood glucose  Eat healthy foods eat more vegetables; eat foods that are low in salt; eat baked foods instead of fried foods  Be physically active find an activity I enjoy; find time in my schedule    Home Blood Glucose Monitoring 09/13/2012  Check my blood sugar 2 times a day  When to check my blood sugar before meals   Care Management & Community Referrals:  Referral 09/06/2012  Referrals made for care management support smoking cessation counselor

## 2012-09-13 NOTE — Assessment & Plan Note (Signed)
BP Readings from Last 3 Encounters:  09/13/12 96/58  09/07/12 110/70  09/06/12 133/84   Lab Results  Component Value Date   NA 138 09/13/2012   K 4.6 09/13/2012   CREATININE 1.00 09/13/2012   Assessment:  Blood pressure control: controlled  Progress toward BP goal:  at goal  Plan:  Medications:  Continue current medications including: lisinopril 10mg  daily, decrease lasix to 20mg  daily, and coreg 6.35mg  bid  Educational resources provided: brochure  Self management tools provided: other (see comments) ("7 Steps to a Healthy Heart")  Other plans: follow up cardiology

## 2012-09-17 ENCOUNTER — Telehealth: Payer: Self-pay | Admitting: *Deleted

## 2012-09-17 NOTE — Telephone Encounter (Signed)
[name]

## 2012-09-25 ENCOUNTER — Other Ambulatory Visit: Payer: Self-pay | Admitting: *Deleted

## 2012-09-26 ENCOUNTER — Telehealth: Payer: Self-pay | Admitting: *Deleted

## 2012-09-26 NOTE — Telephone Encounter (Signed)
Yes, I agree with a verbal order for tub transfer which could reduce her chance of falls secondary to obesity and poor mobility.

## 2012-09-26 NOTE — Telephone Encounter (Signed)
[facility] calls for a verbal for a tub transfer bench, verbal is given, do you agree?

## 2012-09-29 ENCOUNTER — Encounter: Payer: Self-pay | Admitting: Internal Medicine

## 2012-09-29 DIAGNOSIS — R269 Unspecified abnormalities of gait and mobility: Secondary | ICD-10-CM | POA: Insufficient documentation

## 2012-10-15 ENCOUNTER — Ambulatory Visit (INDEPENDENT_AMBULATORY_CARE_PROVIDER_SITE_OTHER): Payer: Medicare Other | Admitting: Internal Medicine

## 2012-10-15 ENCOUNTER — Encounter: Payer: Self-pay | Admitting: Internal Medicine

## 2012-10-15 VITALS — BP 121/78 | HR 88 | Temp 97.4°F | Ht 66.0 in | Wt 266.3 lb

## 2012-10-15 DIAGNOSIS — I10 Essential (primary) hypertension: Secondary | ICD-10-CM

## 2012-10-15 DIAGNOSIS — R42 Dizziness and giddiness: Secondary | ICD-10-CM

## 2012-10-15 DIAGNOSIS — E1142 Type 2 diabetes mellitus with diabetic polyneuropathy: Secondary | ICD-10-CM

## 2012-10-15 DIAGNOSIS — E1140 Type 2 diabetes mellitus with diabetic neuropathy, unspecified: Secondary | ICD-10-CM

## 2012-10-15 DIAGNOSIS — E1165 Type 2 diabetes mellitus with hyperglycemia: Secondary | ICD-10-CM

## 2012-10-15 DIAGNOSIS — E1149 Type 2 diabetes mellitus with other diabetic neurological complication: Secondary | ICD-10-CM

## 2012-10-15 LAB — GLUCOSE, CAPILLARY: Glucose-Capillary: 94 mg/dL (ref 70–99)

## 2012-10-15 MED ORDER — AMITRIPTYLINE HCL 25 MG PO TABS: 25.0000 mg | ORAL_TABLET | Freq: Every evening | ORAL | Status: DC | PRN

## 2012-10-15 MED ORDER — INSULIN GLARGINE 100 UNIT/ML ~~LOC~~ SOLN: 43.0000 [IU] | Freq: Every day | SUBCUTANEOUS | Status: DC

## 2012-10-15 NOTE — Assessment & Plan Note (Signed)
Pertinent Labs: Lab Results  Component Value Date   HGBA1C 9.2* 09/07/2012   HGBA1C 8.9 06/15/2012   CREATININE 1.00 09/13/2012   CREATININE 0.98 09/07/2012   MICROALBUR 0.50 02/16/2012   MICRALBCREAT 4.6 02/16/2012   CHOL 176 09/07/2012   HDL 49 09/07/2012   TRIG 214* 09/07/2012    Assessment: Disease Control: poor control (HgbA1C >9%)  Progress toward goals: improved  Barriers to meeting goals: no barriers identified    The patient indicates compliance with her home insulin regimen. She describes fasting blood sugars ranging from 92-100 mg/dL and preprandial to dinner blood sugars 140-160.  She is currently taking NovoLog approximately 6 units before breakfast and dinner AND Lantus between 42-43 units at bedtime, typically 43. She specifically did not increase her Lantus to 44 units as instructed during the last visit in March.  Plan: Glucometer log was not reviewed today, as pt did not have glucometer available for review.   continue current medications  I will not change her insulin regimen today given that I do not have any glucometer readings from which to base minor adjustments. The numbers that she describes indicate appropriate fasting blood sugar control, and may indicate that she needs to be started on mealtime insulin prior to lunch as well.  However, this determination cannot be made without additional data.  I have instructed the patient to start checking her blood sugars 3 times a day prior to meals.  She will bring her glucometer in during the next visit for review and appropriate adjustment of insulin as indicated.  reminded to bring blood glucose meter & log to each visit  Educational resources provided:    Self management tools provided: instructions for home glucose monitoring  Home glucose monitoring recommendation: 3 times a day before meals

## 2012-10-15 NOTE — Patient Instructions (Addendum)
General Instructions:  Please follow-up at the clinic in 2-3 weeks, at which time we will reevaluate your diabetes - OR, please follow-up in the clinic sooner if needed.  There have been changes in your medications:  DECREASE Lantus to 43 units once nightly.  Continue your other medications at the current doses.   If you are diabetic, please bring your meter to your next visit.  If symptoms worsen, or new symptoms arise, please call the clinic or go to the ER.  PLEASE BRING ALL OF YOUR MEDICATIONS  IN A BAG TO YOUR NEXT APPOINTMENT   Treatment Goals:  Goals (1 Years of Data) as of 10/15/12         As of Today 09/13/12 09/13/12 09/13/12 09/13/12     Blood Pressure    . Blood Pressure < 140/90  121/78 96/58 103/67 106/58 109/60     Lifestyle    . Prevent Falls           Result Component    . HEMOGLOBIN A1C < 7.0          . LDL CALC < 100            Progress Toward Treatment Goals:  Treatment Goal 10/15/2012  Hemoglobin A1C improved  Blood pressure at goal  Prevent falls at goal    Self Care Goals & Plans:  Self Care Goal 10/15/2012  Manage my medications take my medicines as prescribed; refill my medications on time  Monitor my health keep track of my blood glucose; bring my glucose meter and log to each visit; keep track of my weight  Eat healthy foods eat more vegetables; drink diet soda or water instead of juice or soda; eat foods that are low in salt  Be physically active take a walk every day    Home Blood Glucose Monitoring 10/15/2012  Check my blood sugar 3 times a day  When to check my blood sugar before meals     Care Management & Community Referrals:  Referral 09/06/2012  Referrals made for care management support smoking cessation counselor        Heart Failure Heart failure (HF) is a condition in which the heart has trouble pumping blood. This means your heart does not pump blood efficiently for your body to work well. In some cases of HF, fluid may  back up into your lungs or you may have swelling (edema) in your lower legs. HF is a long-term (chronic) condition. It is important for you to take good care of yourself and follow your caregiver's treatment plan. CAUSES   Health conditions:  High blood pressure (hypertension) causes the heart muscle to work harder than normal. When pressure in the blood vessels is high, the heart needs to pump (contract) with more force in order to circulate blood throughout the body. High blood pressure eventually causes the heart to become stiff and weak.  Coronary artery disease (CAD) is the buildup of cholesterol and fat (plaques) in the arteries of the heart. The blockage in the arteries deprives the heart muscle of oxygen and blood. This can cause chest pain and may lead to a heart attack. High blood pressure can also contribute to CAD.  Heart attack (myocardial infarction) occurs when 1 or more arteries in the heart become blocked. The loss of oxygen damages the muscle tissue of the heart. When this happens, part of the heart muscle dies. The injured tissue does not contract as well and weakens the heart's ability to  pump blood.  Abnormal heart valves can cause HF when the heart valves do not open and close properly. This makes the heart muscle pump harder to keep the blood flowing.  Heart muscle disease (cardiomyopathy or myocarditis) is damage to the heart muscle from a variety of causes. These can include drug or alcohol abuse, infections, or unknown reasons. These can increase the risk of HF.  Lung disease makes the heart work harder because the lungs do not work properly. This can cause a strain on the heart leading it to fail.  Diabetes increases the risk of HF. High blood sugar contributes to high fat (lipid) levels in the blood. Diabetes can also cause slow damage to tiny blood vessels that carry important nutrients to the heart muscle. When the heart does not get enough oxygen and food, it can cause  the heart to become weak and stiff. This leads to a heart that does not contract efficiently.  Other diseases can contribute to HF. These include abnormal heart rhythms, thyroid problems, and low blood counts (anemia).  Unhealthy lifestyle habits:  Obesity.  Smoking.  Eating foods high in fat and cholesterol.  Eating or drinking beverages high in salt.  Drug or alcohol abuse.  Lack of exercise. SYMPTOMS  HF symptoms may vary and can be hard to detect. Symptoms may include:  Shortness of breath with activity, such as climbing stairs.  Persistent cough.  Swelling of the feet, ankles, legs, or abdomen.  Unexplained weight gain.  Difficulty breathing when lying flat.  Waking from sleep because of the need to sit up and get more air.  Rapid heartbeat.  Fatigue and loss of energy.  Feeling lightheaded or close to fainting. DIAGNOSIS  A diagnosis of HF is based on your history, symptoms, physical examination, and diagnostic tests. Diagnostic tests for HF may include:  EKG.  Chest X-ray.  Blood tests.  Exercise stress test.  Blood oxygen test (arterial blood gas).  Evaluation by a heart doctor (cardiologist).  Ultrasound evaluation of the heart (echocardiogram).  Heart artery test to look for blockages (angiogram).  Radioactive imaging to look at the heart (radionuclide test). TREATMENT  Treatment is aimed at managing the symptoms of HF. Medicines, lifestyle changes, or surgical intervention may be necessary to treat HF.  Medicines to help treat HF may include:  Angiotensin-converting enzyme (ACE) inhibitors. These block the effects of a blood protein called angiotensin-converting enzyme. ACE inhibitors relax (dilate) the blood vessels and help lower blood pressure. This decreases the workload of the heart, slows the progression of HF, and improves symptoms.  Angiotensin receptor blockers (ARBs). These medications work similar to ACE inhibitors. ARBs may be an  alternative for people who cannot tolerate an ACE inhibitor.  Aldosterone antagonists. This medication helps get rid of extra fluid from your body. This lowers the volume of blood the heart has to pump.  Water pills (diuretics). Diuretics cause the kidneys to remove salt and water from the blood. The extra fluid is removed by urination. By removing extra fluid from the body, diuretics help lower the workload of the heart and help prevent fluid buildup in the lungs so breathing is easier.  Beta blockers. These prevent the heart from beating too fast and improve heart muscle strength. Beta blockers help maintain a normal heart rate, control blood pressure, and improve HF symptoms.  Digitalis. This increases the force of the heartbeat and may be helpful to people with HF or heart rhythm problems.  Healthy lifestyle changes include:  Stopping smoking.  Eating a healthy diet. Avoid foods high in fat. Avoid foods fried in oil or made with fat. A dietician can help with healthy food choices.  Limiting how much salt you eat.  Limiting alcohol intake to no more than 1 drink per day for women and 2 drinks per day for men. Drinking more than that is harmful to your heart. If your heart has already been damaged by alcohol or you have severe HF, drinking alcohol should be stopped completely.  Exercising as directed by your caregiver.  Surgical treatment for HF may include:  Procedures to open blocked arteries, repair damaged heart valves, or remove damaged heart muscle tissue.  A pacemaker to help heart muscle function and to control certain abnormal heart rhythms.  A defibrillator to possibly prevent sudden cardiac death. HOME CARE INSTRUCTIONS   Activity level. Your caregiver can help you determine what type of exercise program may be helpful. It is important to maintain your strength. Pace your physical activity to avoid shortness of breath or chest pain. Rest for 1 hour before and after meals.  A cardiac rehabilitation program may be helpful to some people with HF.  Diet. Eat a heart healthy diet. Food choices should be low in saturated fat and cholesterol. Talk to a dietician to learn about heart healthy foods.  Salt intake. When you have HF, you need to limit the amount of salt you eat. Eat less than 1500 milligrams (mg) of salt per day or as recommended by your caregiver.  Weight monitoring. Weigh yourself every day. You should weigh yourself in the morning after you urinate and before you eat breakfast. Wear the same amount of clothing each time you weigh yourself. Record your weight daily. Bring your recorded weights to your clinic visits. Tell your caregiver right away if you have gained 3 lb/1.4 kg in 1 day, or 5 lb/2.3 kg in a week or whatever amount you were told to report.  Blood pressure monitoring. This should be done as directed by your caregiver. A home blood pressure cuff can be purchased at a drugstore. Record your blood pressure numbers and bring them to your clinic visits. Tell your caregiver if you become dizzy or lightheaded upon standing up.  Smoking. If you are currently a smoker, it is time to quit. Nicotine makes your heart work harder by causing your blood vessels to constrict. Do not use nicotine gum or patches before talking to your caregiver.  Follow up. Be sure to schedule a follow-up visit with your caregiver. Keep all your appointments. SEEK MEDICAL CARE IF:   Your weight increases by 3 lb/1.4 kg in 1 day or 5 lb/2.3 kg in a week.  You notice increasing shortness of breath that is unusual for you. This may happen during rest, sleep, or with activity.  You cough more than normal, especially with physical activity.  You notice more swelling in your hands, feet, ankles, or belly (abdomen).  You are unable to sleep because it is hard to breathe.  You cough up bloody mucus (sputum).  You begin to feel "jumping" or "fluttering" sensations (palpitations)  in your chest. SEEK IMMEDIATE MEDICAL CARE IF:   You have severe chest pain or pressure which may include symptoms such as:  Pain or pressure in the arms, neck, jaw, or back.  Feeling sweaty.  Feeling sick to your stomach (nauseous).  Feeling short of breath while at rest.  Having a fast or irregular heartbeat.  You experience stroke symptoms.  These symptoms include:  Facial weakness or numbness.  Weakness or numbness in an arm, leg, or on one side of your body.  Blurred vision.  Difficulty talking or thinking.  Dizziness or fainting.  Severe headache. MAKE SURE YOU:   Understand these instructions.  Will watch your condition.  Will get help right away if you are not doing well or get worse. Document Released: 06/13/2005 Document Revised: 12/13/2011 Document Reviewed: 09/25/2009 [facility] Patient Information 2013 [place], [place].

## 2012-10-15 NOTE — Assessment & Plan Note (Signed)
Assessment: Resolved. During her last visit, the patient described lightheadedness, which is thought to be multifactorial possibly contributed by her Lasix and her gabapentin. Therefore, these medications were both decreased in dosage. The patient has had resolution of her symptoms with this intervention. She thinks that possibly the gabapentin was contributing more so towards her symptoms, as symptoms typically arose after taking this medication.  Plan:      Continue to monitor symptoms.  Continue Lasix and gabapentin at decreased dosage.   Low threshold to escalate her Lasix further given her history of heart failure.

## 2012-10-15 NOTE — Progress Notes (Signed)
Patient: [name]   MRN: [id number]  DOB: [birth year]  PCP: Kristie Cowman, MD   Subjective:     Chief Complaint  Patient presents with  . Follow-up    Lightheadedness and Diabetes    HPI: [name] is a 52 y.o. female with a PMHx as outlined below, who presented to clinic today for the following:  1) DM2, uncontrolled -  Lab Results  Component Value Date   HGBA1C 9.2* 09/07/2012   Patient checking blood sugars 2 times daily, before breakfast and before dinner. Reports fasting blood sugars of 92-100 mg/dL and before dinner 308-657Q mg/dL. Currently taking Novolog BID with breakfast and dinner - typically 6 units AND Lantus between 42-43 units. Patient misses doses 0 x per week on average.  0 hypoglycemic episodes since last visit. Denies assisted hypoglycemia or recently hospitalizations for either hyper or hypoglycemia. denies polyuria, polydipsia, nausea, vomiting, diarrhea.  does not request refills today.  2) Lightheadedness - during last visit, patient noted worsening positional lightheadedness. This was thought to be multifactorial in setting of polypharmacy (with multiple contributing medications such as gabapentin, lasix). At that visit, lasix was decreased to 20mg  a day and the gabapentin was decreased to 300mg  BID from prior 600mg  BID. Sx have since stabilized and no longer having lightheadedness. Remains without focal weakness, slurred speech, paresthesias.   Review of Systems: Per HPI.   Current Outpatient Medications: Medication Sig  . amitriptyline (ELAVIL) 25 MG tablet TAKE ONE TABLET BY MOUTH AT BEDTIME  . aspirin 81 MG tablet Take 1 tablet (81 mg total) by mouth daily.  . carvedilol (COREG) 6.25 MG tablet Take 1 tablet (6.25 mg total) by mouth 2 (two) times daily.  . dorzolamide-timolol (COSOPT) 22.3-6.8 MG/ML ophthalmic solution Place 1 drop into the right eye 2 (two) times daily.  . ferrous sulfate 325 (65 FE) MG tablet Take 1 tablet (325 mg  total) by mouth daily with breakfast.  . furosemide (LASIX) 40 MG tablet Take 0.5 tablets (20 mg total) by mouth daily.  Marland Kitchen gabapentin (NEURONTIN) 600 MG tablet Take 0.5 tablets (300 mg total) by mouth 2 (two) times daily.  . insulin aspart (NOVOLOG) 100 UNIT/ML injection Inject 4-6 Units into the skin 3 (three) times daily before meals. 4 units in the morning and 6 units in the evening  . insulin glargine (LANTUS) 100 UNIT/ML injection Inject 0.44 mLs (44 Units total) into the skin at bedtime.  Marland Kitchen lisinopril (PRINIVIL,ZESTRIL) 20 MG tablet Take 10 mg by mouth daily.   . metFORMIN (GLUCOPHAGE) 1000 MG tablet 1,000 mg 2 (two) times daily with a meal.   . omeprazole (PRILOSEC) 20 MG capsule Take 1 capsule (20 mg total) by mouth daily.  . pravastatin (PRAVACHOL) 20 MG tablet Take 1 tablet (20 mg total) by mouth daily.  . traMADol (ULTRAM) 50 MG tablet Take 1 tablet (50 mg total) by mouth every 6 (six) hours as needed. For pain    Allergies: No Known Allergies   Past Medical History  Diagnosis Date  . Hypertension   . Hyperlipidemia   . GERD (gastroesophageal reflux disease)   . Adhesive capsulitis of shoulder     bilateral, Steroid injection Dr. Nedra Hai 1/12 bilaterally  . Peripheral neuropathy   . Adhesive capsulitis of right shoulder     Steroid injection Dr. Nedra Hai 1/12  . Adhesive capsulitis of left shoulder     Steroid injection Dr. Nedra Hai 1/12  . Obesity   . Diabetic retinopathy(362.0)   .  Glaucoma(365)   . Diabetes mellitus     Type II, insulin dependent  . CAD (coronary artery disease)     nonobstructive. Last cardiac cath (2008) showing left circumflex with mid 50% stenosis and distal luminal irregularities. Also with RCA with mid to distal 30-40% stenosis. // [facility] by [facility], never followed up outpatient.  . CHF (congestive heart failure)      Objective:    Physical Exam: Filed Vitals:   10/15/12 1004  BP: 121/78  Pulse: 88  Temp: 97.4 F (36.3 C)       General: Vital signs reviewed and noted. Well-developed, well-nourished, in no acute distress; alert, appropriate and cooperative throughout examination.  Head: Normocephalic, atraumatic.  Lungs:  Normal respiratory effort. Clear to auscultation BL without crackles or wheezes.  Heart: RRR. S1 and S2 normal without gallop, rubs. No murmur.  Abdomen:  BS normoactive. Soft, Nondistended, non-tender.  No masses or organomegaly.  Extremities: 1-2+ pretibial edema (pt states this is her baseline)   Assessment/ Plan:   The patient's case and plan of care was discussed with attending physician, Dr. Ulyess Mort.

## 2012-10-15 NOTE — Assessment & Plan Note (Signed)
Pertinent Data: BP Readings from Last 3 Encounters:  10/15/12 121/78  09/13/12 96/58  09/07/12 110/70    Basic Metabolic Panel:    Component Value Date/Time   NA 138 09/13/2012 1626   K 4.6 09/13/2012 1626   CL 99 09/13/2012 1626   CO2 27 09/13/2012 1626   BUN 23 09/13/2012 1626   CREATININE 1.00 09/13/2012 1626   CREATININE 0.98 09/07/2012 0530   GLUCOSE 223* 09/13/2012 1626   CALCIUM 9.1 09/13/2012 1626    Assessment: Disease Control: controlled  Progress toward goals: at goal  Barriers to meeting goals: no barriers identified    Patient has history of ICM / systolic heart failure with last EF of 35-40%. She was recently decreased of her Lasix to 20 mg daily in March 2014 in setting of positional lightheadedness.  She does have lower from the edema on exam, however she notes it to be at her baseline and specifically not worse since prior to Lasix decrease.  Patient is compliant most of the time with prescribed medications.   Plan:  continue current medications  Educated about checking weights daily and restricting fluids.  She is going to scale this week.  She is to followup with her cardiologist on May 12, and wants to discuss with them possibly getting heart failure nurse involvement at home.  Will continue current Lasix dosage, however she is advised to notify us if she is having worsening orthopnea symptoms or worsening lower extremity edema.  Educational resources provided: handout  Self management tools provided:

## 2012-10-18 NOTE — Progress Notes (Signed)
agree with notes and plans. 

## 2012-10-29 ENCOUNTER — Encounter: Payer: Self-pay | Admitting: Internal Medicine

## 2012-10-29 ENCOUNTER — Ambulatory Visit (HOSPITAL_COMMUNITY)
Admission: RE | Admit: 2012-10-29 | Discharge: 2012-10-29 | Disposition: A | Discharge status: Home / Self Care | Payer: Medicare Other | Source: Ambulatory Visit | Attending: Internal Medicine | Admitting: Internal Medicine

## 2012-10-29 ENCOUNTER — Ambulatory Visit (INDEPENDENT_AMBULATORY_CARE_PROVIDER_SITE_OTHER): Payer: Medicare Other | Admitting: Internal Medicine

## 2012-10-29 VITALS — BP 144/77 | HR 101 | Temp 97.9°F | Ht 66.0 in | Wt 267.4 lb

## 2012-10-29 DIAGNOSIS — M25561 Pain in right knee: Secondary | ICD-10-CM

## 2012-10-29 DIAGNOSIS — M25562 Pain in left knee: Secondary | ICD-10-CM

## 2012-10-29 DIAGNOSIS — E1165 Type 2 diabetes mellitus with hyperglycemia: Secondary | ICD-10-CM

## 2012-10-29 DIAGNOSIS — E1149 Type 2 diabetes mellitus with other diabetic neurological complication: Secondary | ICD-10-CM

## 2012-10-29 DIAGNOSIS — M7640 Tibial collateral bursitis [Pellegrini-Stieda], unspecified leg: Secondary | ICD-10-CM | POA: Insufficient documentation

## 2012-10-29 DIAGNOSIS — M25569 Pain in unspecified knee: Secondary | ICD-10-CM | POA: Insufficient documentation

## 2012-10-29 DIAGNOSIS — M25462 Effusion, left knee: Secondary | ICD-10-CM | POA: Insufficient documentation

## 2012-10-29 DIAGNOSIS — E1142 Type 2 diabetes mellitus with diabetic polyneuropathy: Secondary | ICD-10-CM

## 2012-10-29 DIAGNOSIS — E1140 Type 2 diabetes mellitus with diabetic neuropathy, unspecified: Secondary | ICD-10-CM

## 2012-10-29 DIAGNOSIS — M25469 Effusion, unspecified knee: Secondary | ICD-10-CM | POA: Insufficient documentation

## 2012-10-29 DIAGNOSIS — M549 Dorsalgia, unspecified: Secondary | ICD-10-CM

## 2012-10-29 MED ORDER — TRAMADOL HCL 50 MG PO TABS: 50.0000 mg | ORAL_TABLET | Freq: Four times a day (QID) | ORAL | Status: DC | PRN

## 2012-10-29 NOTE — Assessment & Plan Note (Signed)
Patient does not bring her glucose meter today. She reports her sugars being in 100- 120s. No new symptoms.

## 2012-10-29 NOTE — Progress Notes (Signed)
I discussed this case with Dr. Patel soon after the patient visit, I have read the documentation and I agree with the plan of care provided. Please see the resident note for details of management. 

## 2012-10-29 NOTE — Progress Notes (Signed)
  Subjective:    Patient ID: [name], female    DOB: 21-Jul-1960, 52 y.o.   MRN: [id number]  HPI patient is a pleasant 62 year woman with DM 2, obesity, hyperlipidemia, CHF and other problems as per problem list who comes to the clinic for acute onset bilateral knee pain right more than left since last Thursday.  Patient reports of having bilateral knee pain more when she gets up from sitting position. She has to start using a walker because of the pain. The pain radiates to the hip on the right side but not on the left. Does not radiate to the leg. She says it is different from the neuropathy pain she had before, but feels similar to the back pain she have.  Does not report any injury, swelling of any joints, popping sensation or giving up of the knees. The pain continues when she walks but is not as bad as when she gets up.  She denies any fever, chills, nausea, vomiting, chest pain, short of breath, abdominal pain, diarrhea.    Review of Systems    As per history of present illness. Objective:   Physical Exam  General: NAD HEENT: PERRL, EOMI, no scleral icterus Cardiac: S1, S2, RRR, no rubs, murmurs or gallops Pulm: clear to auscultation bilaterally, moving normal volumes of air Abd: soft, nontender, nondistended, BS present Ext: warm and well perfused, 1+ pedal edema bilaterally. Decreased range of motion of bilaterally joints with tenderness to palpation on anteromedial and superior Joint margins. No joint swelling or redness noted. Neuro: alert and oriented X3, cranial nerves II-XII grossly intact       Assessment & Plan:

## 2012-10-29 NOTE — Patient Instructions (Signed)
Please make followup appointment in 7-10 days.  Get the x-rays of your knees done today. I will call you if the results show something abnormal.  Meanwhile keep taking tramadol and Tylenol as needed for pain.  Call the clinic if the symptoms get worse.

## 2012-10-29 NOTE — Assessment & Plan Note (Addendum)
Unclear etiology. Likely worsening osteoarthritis from obesity and chronic joint strain. Although patient does not have history of osteoarthritis in knee joints.  - Check x-rays of left and right knee joint. - Continue pain medication- tramadol and Tylenol as needed. - Further management as per x-ray results. - Repeat clinic visit in 7-10 days.   - Knee x-rays showed tricompartmental disease bilaterally with joint effusions. - We'll make an appointment with sports medicine clinic for further help in management.

## 2012-10-29 NOTE — Addendum Note (Signed)
Addended by: Lyn Hollingshead C on: 10/29/2012 01:23 PM   Modules accepted: Orders

## 2012-11-05 ENCOUNTER — Emergency Department (HOSPITAL_COMMUNITY)
Admission: EM | Admit: 2012-11-05 | Discharge: 2012-11-05 | Disposition: A | Discharge status: Home / Self Care | Payer: Medicare Other | Attending: Emergency Medicine | Admitting: Emergency Medicine

## 2012-11-05 ENCOUNTER — Encounter: Payer: Medicare Other | Admitting: Cardiovascular Disease

## 2012-11-05 ENCOUNTER — Encounter (HOSPITAL_COMMUNITY): Payer: Self-pay | Admitting: Emergency Medicine

## 2012-11-05 ENCOUNTER — Ambulatory Visit: Payer: Medicare Other | Admitting: Physician Assistant

## 2012-11-05 ENCOUNTER — Ambulatory Visit (INDEPENDENT_AMBULATORY_CARE_PROVIDER_SITE_OTHER): Payer: Medicare Other | Admitting: Family Medicine

## 2012-11-05 VITALS — BP 131/81 | Ht 66.0 in | Wt 239.0 lb

## 2012-11-05 DIAGNOSIS — M25569 Pain in unspecified knee: Secondary | ICD-10-CM

## 2012-11-05 DIAGNOSIS — E119 Type 2 diabetes mellitus without complications: Secondary | ICD-10-CM | POA: Insufficient documentation

## 2012-11-05 DIAGNOSIS — Z9861 Coronary angioplasty status: Secondary | ICD-10-CM | POA: Insufficient documentation

## 2012-11-05 DIAGNOSIS — G8929 Other chronic pain: Secondary | ICD-10-CM | POA: Insufficient documentation

## 2012-11-05 DIAGNOSIS — Z7982 Long term (current) use of aspirin: Secondary | ICD-10-CM | POA: Insufficient documentation

## 2012-11-05 DIAGNOSIS — Z8701 Personal history of pneumonia (recurrent): Secondary | ICD-10-CM | POA: Insufficient documentation

## 2012-11-05 DIAGNOSIS — M25561 Pain in right knee: Secondary | ICD-10-CM

## 2012-11-05 DIAGNOSIS — Z8669 Personal history of other diseases of the nervous system and sense organs: Secondary | ICD-10-CM | POA: Insufficient documentation

## 2012-11-05 DIAGNOSIS — K219 Gastro-esophageal reflux disease without esophagitis: Secondary | ICD-10-CM | POA: Insufficient documentation

## 2012-11-05 DIAGNOSIS — Z79899 Other long term (current) drug therapy: Secondary | ICD-10-CM | POA: Insufficient documentation

## 2012-11-05 DIAGNOSIS — I2510 Atherosclerotic heart disease of native coronary artery without angina pectoris: Secondary | ICD-10-CM | POA: Insufficient documentation

## 2012-11-05 DIAGNOSIS — E11319 Type 2 diabetes mellitus with unspecified diabetic retinopathy without macular edema: Secondary | ICD-10-CM | POA: Insufficient documentation

## 2012-11-05 DIAGNOSIS — I509 Heart failure, unspecified: Secondary | ICD-10-CM | POA: Insufficient documentation

## 2012-11-05 DIAGNOSIS — E669 Obesity, unspecified: Secondary | ICD-10-CM | POA: Insufficient documentation

## 2012-11-05 DIAGNOSIS — Z8739 Personal history of other diseases of the musculoskeletal system and connective tissue: Secondary | ICD-10-CM | POA: Insufficient documentation

## 2012-11-05 DIAGNOSIS — H409 Unspecified glaucoma: Secondary | ICD-10-CM | POA: Insufficient documentation

## 2012-11-05 DIAGNOSIS — E785 Hyperlipidemia, unspecified: Secondary | ICD-10-CM | POA: Insufficient documentation

## 2012-11-05 DIAGNOSIS — Z794 Long term (current) use of insulin: Secondary | ICD-10-CM | POA: Insufficient documentation

## 2012-11-05 DIAGNOSIS — I10 Essential (primary) hypertension: Secondary | ICD-10-CM | POA: Insufficient documentation

## 2012-11-05 DIAGNOSIS — E1139 Type 2 diabetes mellitus with other diabetic ophthalmic complication: Secondary | ICD-10-CM | POA: Insufficient documentation

## 2012-11-05 DIAGNOSIS — Z87891 Personal history of nicotine dependence: Secondary | ICD-10-CM | POA: Insufficient documentation

## 2012-11-05 MED ORDER — ACETAMINOPHEN 500 MG PO TABS: 500.0000 mg | ORAL_TABLET | Freq: Four times a day (QID) | ORAL | Status: DC | PRN

## 2012-11-05 MED ORDER — HYDROCODONE-ACETAMINOPHEN 5-325 MG PO TABS: 2.0000 | ORAL_TABLET | Freq: Three times a day (TID) | ORAL | Status: DC | PRN

## 2012-11-05 MED ORDER — HYDROCODONE-ACETAMINOPHEN 5-325 MG PO TABS
2.0000 | ORAL_TABLET | Freq: Once | ORAL | Status: AC
  Administered 2012-11-05: 2 via ORAL
  Filled 2012-11-05: qty 2

## 2012-11-05 NOTE — ED Provider Notes (Signed)
History     CSN: [id number]  Arrival date & time 11/05/12  1117   First MD Initiated Contact with Patient 11/05/12 1230      Chief Complaint  Patient presents with  . Leg Pain    (Consider location/radiation/quality/duration/timing/severity/associated sxs/prior treatment) HPI Pt is a 52yo female with right leg pain x2 weeks. States she was seen last week and had xrays taken but never told what was found and states pain is worse.  Pain is achy and throbbing 9/10 making it difficult to walk.  States her daughter drove her here. Pt has f/u appointment with Sports Medicine later today.  Denies fever, chills, n/v/d, redness or warmth of right knee.   Past Medical History  Diagnosis Date  . Hypertension   . Hyperlipidemia   . GERD (gastroesophageal reflux disease)   . Adhesive capsulitis of shoulder     bilateral, Steroid injection Dr. Nedra Hai 1/12 bilaterally  . Peripheral neuropathy   . Adhesive capsulitis of right shoulder     Steroid injection Dr. Nedra Hai 1/12  . Adhesive capsulitis of left shoulder     Steroid injection Dr. Nedra Hai 1/12  . Obesity   . Diabetic retinopathy(362.0)   . Glaucoma(365)   . Diabetes mellitus     Type II, insulin dependent  . CAD (coronary artery disease)     nonobstructive. Last cardiac cath (2008) showing left circumflex with mid 50% stenosis and distal luminal irregularities. Also with RCA with mid to distal 30-40% stenosis. // Previously evaluated by [facility], never followed up outpatient.  . CHF (congestive heart failure)   . CAP (community acquired pneumonia) 06/15/2012    05/2012 CXR: Mild opacification of the posterior lung base on the lateral film  as cannot exclude infection/atelectasis.      Past Surgical History  Procedure Laterality Date  . Cataract extraction    . Glaucoma surgery    . Cardiac catheterization    . Tubal ligation      Family History  Problem Relation Age of Onset  . Hypertension Sister   . Hypertension Sister    . Hypertension Sister     History  Substance Use Topics  . Smoking status: Former Smoker -- 0.30 packs/day for 7 years    Types: Cigarettes    Quit date: 06/27/2006  . Smokeless tobacco: Never Used  . Alcohol Use: No    OB History   Grav Para Term Preterm Abortions TAB SAB Ect Mult Living                  Review of Systems  Constitutional: Negative for fever and chills.  Musculoskeletal: Positive for myalgias, joint swelling, arthralgias and gait problem.    Allergies  Review of patient's allergies indicates no known allergies.  Home Medications   Current Outpatient Rx  Name  Route  Sig  Dispense  Refill  . acetaminophen (TYLENOL) 500 MG tablet   Oral   Take 1 tablet (500 mg total) by mouth every 6 (six) hours as needed for pain.   30 tablet   0   . amitriptyline (ELAVIL) 25 MG tablet   Oral   Take 1 tablet (25 mg total) by mouth at bedtime as needed for sleep.   30 tablet   0   . aspirin 81 MG tablet   Oral   Take 1 tablet (81 mg total) by mouth daily.   30 tablet   11   . carvedilol (COREG) 6.25 MG tablet   Oral  Take 1 tablet (6.25 mg total) by mouth 2 (two) times daily.   60 tablet   11   . dorzolamide-timolol (COSOPT) 22.3-6.8 MG/ML ophthalmic solution   Right Eye   Place 1 drop into the right eye 2 (two) times daily.         . ferrous sulfate 325 (65 FE) MG tablet   Oral   Take 1 tablet (325 mg total) by mouth daily with breakfast.   30 tablet   11   . furosemide (LASIX) 40 MG tablet   Oral   Take 0.5 tablets (20 mg total) by mouth daily.   30 tablet   1   . gabapentin (NEURONTIN) 600 MG tablet   Oral   Take 0.5 tablets (300 mg total) by mouth 2 (two) times daily.   90 tablet   6   . HYDROcodone-acetaminophen (NORCO/VICODIN) 5-325 MG per tablet   Oral   Take 2 tablets by mouth every 8 (eight) hours as needed for pain.   60 tablet   0   . insulin aspart (NOVOLOG) 100 UNIT/ML injection   Subcutaneous   Inject 4-6 Units into  the skin 3 (three) times daily before meals. 4 units in the morning and 6 units in the evening         . insulin glargine (LANTUS) 100 UNIT/ML injection   Subcutaneous   Inject 0.43 mLs (43 Units total) into the skin at bedtime.   10 mL   12   . lisinopril (PRINIVIL,ZESTRIL) 20 MG tablet   Oral   Take 10 mg by mouth daily.          . metFORMIN (GLUCOPHAGE) 1000 MG tablet      1,000 mg 2 (two) times daily with a meal.          . omeprazole (PRILOSEC) 20 MG capsule   Oral   Take 1 capsule (20 mg total) by mouth daily.   30 capsule   11   . pravastatin (PRAVACHOL) 20 MG tablet   Oral   Take 1 tablet (20 mg total) by mouth daily.   30 tablet   6   . traMADol (ULTRAM) 50 MG tablet   Oral   Take 1 tablet (50 mg total) by mouth every 6 (six) hours as needed. For pain   60 tablet   2     BP 144/62  Pulse 84  Temp(Src) 98.2 F (36.8 C) (Oral)  SpO2 95%  LMP 02/13/2011  Physical Exam  Nursing note and vitals reviewed. Constitutional: She appears well-developed and well-nourished. No distress.  HENT:  Head: Normocephalic and atraumatic.  Eyes: Conjunctivae are normal. No scleral icterus.  Neck: Normal range of motion.  Cardiovascular: Normal rate, regular rhythm and normal heart sounds.   Pulmonary/Chest: Effort normal and breath sounds normal. No respiratory distress. She has no wheezes.  Musculoskeletal: Normal range of motion. She exhibits tenderness ( diffuse, right knee). She exhibits no edema.       Right knee: She exhibits normal range of motion.  CMS in tact.  Pain with weight bearing.  Neurological: She is alert.  Skin: Skin is warm and dry. She is not diaphoretic.  Psychiatric: She has a normal mood and affect. Her behavior is normal.    ED Course  Procedures (including critical care time)  Labs Reviewed - No data to display No results found.   1. Chronic knee pain, right       MDM  Pt c/o increased right  leg pain of 2 weeks.  Pt reports  having had xrays taken and being set up with Sports Medicine appointment later today around 15:00 but pain was worse today so she cam here.  States her daughter drove her.  She plans on still going to Sports Medicine appointment. PE: severe diffuse tenderness around right knee. CMS in tact, pain with weight bearing. No erythema or warmth around joint.  Pt appears well with vitals WNL.  Not concerned for septic joint at this time.  Tx in ED: norco  Pt has ride home with daughter.    Rx: crutches and acetaminophen  Pt is going to see Sports Specialist for pain management at 15:00 today.  Will have them determine best pain management. Vitals: unremarkable. Discharged in stable condition.           MDM Number of Diagnoses or Management Options Chronic knee pain, right:         Junius Finner, PA-C 11/05/12 1635

## 2012-11-05 NOTE — ED Notes (Signed)
Denies injury.

## 2012-11-05 NOTE — ED Notes (Signed)
Leg pain x 2 weeks saw the dr last week and had xrays states pain is worse

## 2012-11-05 NOTE — Progress Notes (Signed)
Pt of Dr. Excell Seltzer. She was on my schedule today for f/u (fill in for [facility], PA-C). She called 20 minutes after her scheduled appt time to cancel her appt. cdm

## 2012-11-05 NOTE — Progress Notes (Signed)
  Subjective:    Patient ID: [name], female    DOB: Jun 06, 1961, 52 y.o.   MRN: [id number]  HPI  Several weeks of worsening right knee pain. No specific injury. It has been difficult to bear weight over the last several days. It hurts so much that she went to the emergency room the other night and had some x-rays done. She's not sure what she did to it but has never had pain like this before. No specific injury. No history of knee surgeries.  Review of Systems No fever, she does have a small amount of swelling in the knee is present. No warmth or erythema of the knee.    Objective:   Physical Exam Signs are reviewed GENERAL: Well-developed female no acute distress KNEE: Right. Positive effusion. Diffusely tender to palpation particularly over the anterior portion of the knee in the medial joint line. Extension is limited by pain at about 70. She cannot tolerate a Apley compression test or a McMurray. The calf is soft. The knee is not red or warm. Distally she is neurovascularly intact. INJECTION: Patient was given informed consent, signed copy in the chart. Appropriate time out was taken. Area prepped and draped in usual sterile fashion. One cc of methylprednisolone 40 mg/ml plus  4 cc of 1% lidocaine without epinephrine was injected into the right knee using a(n) anterior medial approach. The patient tolerated the procedure well. There were no complications. Post procedure instructions were given.        Assessment & Plan:  #1. Knee pain with knee effusion. Are reviewed her x-rays. The left knee shows a Stieda process but the right knee shows no bony process. There does appear to be some effusion. Given her history and exam I think this is most consistent with a meniscal tear. She would like to avoid surgery if at all possible. She is in quite a bit of pain. They'll give her one week prescription of Vicodin and she said the tramadol he gave her [place] room is not helping. She knows  not to take both together. We did a corticosteroid injection into her knee. She will call me if for 5 days and if she's improving we'll follow conservatively. She's had little or no improvement I think we need to get MRI.

## 2012-11-06 ENCOUNTER — Ambulatory Visit: Payer: Medicare Other | Admitting: Internal Medicine

## 2012-11-06 NOTE — ED Provider Notes (Signed)
Medical screening examination/treatment/procedure(s) were performed by non-physician practitioner and as supervising physician I was immediately available for consultation/collaboration.   Richardean Canal, MD 11/06/12 [phone]

## 2012-11-07 ENCOUNTER — Encounter: Payer: Self-pay | Admitting: Internal Medicine

## 2012-11-09 ENCOUNTER — Ambulatory Visit: Payer: Medicare Other | Admitting: Family Medicine

## 2012-11-09 ENCOUNTER — Telehealth: Payer: Self-pay | Admitting: *Deleted

## 2012-11-09 DIAGNOSIS — M25561 Pain in right knee: Secondary | ICD-10-CM

## 2012-11-09 NOTE — Telephone Encounter (Signed)
Message copied by Mora Bellman on Fri Nov 09, 2012  9:36 AM ------      Message from: CERESI, Shawna Orleans L      Created: Thu Nov 08, 2012 12:13 PM      Regarding: phone message      Contact: [phone]       Pt states the injection didn't help her right knee, Dr. Jennette Kettle mentioned getting an mri, she would like that order put in. ------

## 2012-11-09 NOTE — Telephone Encounter (Signed)
Scheduled for 11/15/12 @ 4:30 pm.  Pt notified of appt info.

## 2012-11-09 NOTE — Telephone Encounter (Signed)
Ordered MRI per Dr. Jennette Kettle.

## 2012-11-15 ENCOUNTER — Ambulatory Visit
Admission: RE | Admit: 2012-11-15 | Discharge: 2012-11-15 | Disposition: A | Discharge status: Home / Self Care | Payer: Medicare Other | Source: Ambulatory Visit | Attending: Family Medicine | Admitting: Family Medicine

## 2012-11-15 DIAGNOSIS — M25561 Pain in right knee: Secondary | ICD-10-CM

## 2012-11-20 ENCOUNTER — Telehealth: Payer: Self-pay | Admitting: Family Medicine

## 2012-11-20 ENCOUNTER — Telehealth: Payer: Self-pay | Admitting: *Deleted

## 2012-11-20 NOTE — Telephone Encounter (Signed)
Pt notified of MRI results.  Scheduled her for an appt with Dr. Thurston Hole on 11/22/12 at 9am.  Pt notified of appt info today.

## 2012-11-20 NOTE — Telephone Encounter (Signed)
Amy Please tell her the MRI shows quite a bit of degenerative issues---potentially a meniscal tear. She needs to be seen and evaluated by ORTHO---I would recommend soon. Unclear exactly what she needs (scope vs TKR etc). Thurston Hole would be good THANKS! Denny Levy

## 2012-11-20 NOTE — Telephone Encounter (Signed)
Pt called Looking for rt knee mri results

## 2012-11-28 ENCOUNTER — Ambulatory Visit (INDEPENDENT_AMBULATORY_CARE_PROVIDER_SITE_OTHER): Payer: Medicare Other | Admitting: Physician Assistant

## 2012-11-28 ENCOUNTER — Encounter: Payer: Self-pay | Admitting: Cardiovascular Disease

## 2012-11-28 ENCOUNTER — Encounter: Payer: Self-pay | Admitting: *Deleted

## 2012-11-28 ENCOUNTER — Encounter: Payer: Self-pay | Admitting: Physician Assistant

## 2012-11-28 ENCOUNTER — Encounter: Payer: Self-pay | Admitting: Internal Medicine

## 2012-11-28 VITALS — BP 128/80 | HR 87 | Ht 66.0 in | Wt 259.8 lb

## 2012-11-28 DIAGNOSIS — I10 Essential (primary) hypertension: Secondary | ICD-10-CM

## 2012-11-28 DIAGNOSIS — Z01810 Encounter for preprocedural cardiovascular examination: Secondary | ICD-10-CM

## 2012-11-28 DIAGNOSIS — I5022 Chronic systolic (congestive) heart failure: Secondary | ICD-10-CM

## 2012-11-28 DIAGNOSIS — I2510 Atherosclerotic heart disease of native coronary artery without angina pectoris: Secondary | ICD-10-CM

## 2012-11-28 DIAGNOSIS — E785 Hyperlipidemia, unspecified: Secondary | ICD-10-CM

## 2012-11-28 NOTE — Patient Instructions (Addendum)
NO CHANGES WERE MADE TODAY   Your physician wants you to follow-up in: 6 MONTHS WITH DR. Excell Seltzer. You will receive a reminder letter in the mail two months in advance. If you don't receive a letter, please call our office to schedule the follow-up appointment.

## 2012-11-28 NOTE — Progress Notes (Signed)
[address] [place], [place]  [address] Phone: [phone] Fax:  [phone]  Date:  11/28/2012   ID:  [name], DOB [birth year], MRN [id number]  PCP:  Carrie Cowman, MD  Cardiologist:  Dr. Tonny Bollman     History of Present Illness: [name] is a 52 y.o. female who returns for followup on CHF. She was diagnosed with a nonischemic cardiomyopathy and systolic CHF in 12/2011. Echocardiogram 7/13: limited study, EF 35-40%. LHC 7/13: Mid LAD 50% within a myocardial bridge, EF 40-45%. Last seen by Dr. Excell Seltzer 11/13. Beta blocker was changed to carvedilol. Since last seen, she was admitted 08/2012 with lightheadedness. She was seen by the stroke service. MRI was negative for acute infarct. She did have chronic microvascular ischemic changes. Symptoms improved with IV fluids. Lasix dose was reduced by her PCP at outpatient followup. Carotid Dopplers were negative for ICA stenosis. Echocardiogram 3/14: Limited study, mild LVH, EF probably normal.  Since last seen, she has been doing well. She denies chest pain, significant dyspnea, syncope. She is NYHA class II-IIb. Knee pain from DJD limits her. She denies orthopnea, PND or significant pedal edema. She denies further dizziness. After the patient left the office, I received documentation that she needs surgical clearance for knee arthroscopy.  Labs (3/14):  K 4.6, Cr 1.00, ALT 12, BNP 8.2, LDL 84, Hgb 10.5  Wt Readings from Last 3 Encounters:  11/28/12 259 lb 12.8 oz (117.845 kg)  11/05/12 239 lb (108.41 kg)  10/29/12 267 lb 6.4 oz (121.292 kg)     Past Medical History  Diagnosis Date  . Hypertension   . Hyperlipidemia   . GERD (gastroesophageal reflux disease)   . Adhesive capsulitis of shoulder     bilateral, Steroid injection Dr. Nedra Hai 1/12 bilaterally  . Peripheral neuropathy   . Adhesive capsulitis of right shoulder     Steroid injection Dr. Nedra Hai 1/12  . Adhesive capsulitis of left shoulder    Steroid injection Dr. Nedra Hai 1/12  . Obesity   . Diabetic retinopathy   . Glaucoma   . Diabetes mellitus     Type II, insulin dependent  . CAD (coronary artery disease)     nonobstructive. Last cardiac cath (2008) showing left circumflex with mid 50% stenosis and distal luminal irregularities. Also with RCA with mid to distal 30-40% stenosis. // Previously evaluated by [facility], never followed up outpatient.  . CHF (congestive heart failure)   . CAP (community acquired pneumonia) 06/15/2012    05/2012 CXR: Mild opacification of the posterior lung base on the lateral film  as cannot exclude infection/atelectasis.      Current Outpatient Prescriptions  Medication Sig Dispense Refill  . acetaminophen (TYLENOL) 500 MG tablet Take 1 tablet (500 mg total) by mouth every 6 (six) hours as needed for pain.  30 tablet  0  . amitriptyline (ELAVIL) 25 MG tablet Take 1 tablet (25 mg total) by mouth at bedtime as needed for sleep.  30 tablet  0  . aspirin 81 MG tablet Take 1 tablet (81 mg total) by mouth daily.  30 tablet  11  . carvedilol (COREG) 6.25 MG tablet Take 1 tablet (6.25 mg total) by mouth 2 (two) times daily.  60 tablet  11  . dorzolamide-timolol (COSOPT) 22.3-6.8 MG/ML ophthalmic solution Place 1 drop into the right eye 2 (two) times daily.      . ferrous sulfate 325 (65 FE) MG tablet Take 1 tablet (325 mg total)  by mouth daily with breakfast.  30 tablet  11  . furosemide (LASIX) 40 MG tablet Take 0.5 tablets (20 mg total) by mouth daily.  30 tablet  1  . gabapentin (NEURONTIN) 600 MG tablet Take 0.5 tablets (300 mg total) by mouth 2 (two) times daily.  90 tablet  6  . HYDROcodone-acetaminophen (NORCO/VICODIN) 5-325 MG per tablet Take 2 tablets by mouth every 8 (eight) hours as needed for pain.  60 tablet  0  . insulin aspart (NOVOLOG) 100 UNIT/ML injection Inject 4-6 Units into the skin 3 (three) times daily before meals. 4 units in the morning and 6 units in the evening      .  insulin glargine (LANTUS) 100 UNIT/ML injection Inject 0.43 mLs (43 Units total) into the skin at bedtime.  10 mL  12  . lisinopril (PRINIVIL,ZESTRIL) 20 MG tablet Take 10 mg by mouth daily.       . metFORMIN (GLUCOPHAGE) 1000 MG tablet 1,000 mg 2 (two) times daily with a meal.       . omeprazole (PRILOSEC) 20 MG capsule Take 1 capsule (20 mg total) by mouth daily.  30 capsule  11  . pravastatin (PRAVACHOL) 20 MG tablet Take 1 tablet (20 mg total) by mouth daily.  30 tablet  6  . traMADol (ULTRAM) 50 MG tablet Take 1 tablet (50 mg total) by mouth every 6 (six) hours as needed. For pain  60 tablet  2   No current facility-administered medications for this visit.    Allergies:   No Known Allergies  Social History:  The patient  reports that she quit smoking about 6 years ago. Her smoking use included Cigarettes. She has a 2.1 pack-year smoking history. She has never used smokeless tobacco. She reports that she does not drink alcohol or use illicit drugs.   ROS:  Please see the history of present illness.      All other systems reviewed and negative.   PHYSICAL EXAM: VS:  BP 128/80  Pulse 87  Ht 5\' 6"  (1.676 m)  Wt 259 lb 12.8 oz (117.845 kg)  BMI 41.95 kg/m2  LMP 02/13/2011 Well nourished, well developed, in no acute distress HEENT: normal Neck: no JVD Vascular: No carotid bruit Cardiac:  normal S1, S2; RRR; no murmur Lungs:  clear to auscultation bilaterally, no wheezing, rhonchi or rales Abd: soft, nontender, no hepatomegaly Ext: trace bilateral LE edema Skin: warm and dry Neuro:  CNs 2-12 intact, no focal abnormalities noted  EKG:  NSR, HR 87, normal axis, no acute changes     ASSESSMENT AND PLAN:  1. Chronic Systolic CHF: Volume stable. Continue current therapy. Recent echocardiogram suggests recovery of LV function. 2. CAD: Nonobstructive by recent cardiac catheterization. Continue aspirin and statin. No symptoms of angina reported. 3. Hypertension: Controlled. Continue  current therapy. 4. Hyperlipidemia: Continue statin. 5. Surgical Clearance: She needs knee arthroscopy. She is not having any unstable cardiac conditions. She can achieve 4 METs without symptoms suggestive of angina or worsening heart failure. According to ACC/AHA guidelines, she may proceed with her noncardiac surgery at acceptable risk. If needed, she may hold her aspirin 5-7 days prior to her procedure and restart afterward when acceptable. Our services available as needed. 6. Disposition: Followup with Dr. Excell Seltzer in 6 months.  Signed, Tereso Newcomer, PA-C  11/28/2012 11:35 AM

## 2012-11-29 ENCOUNTER — Telehealth: Payer: Self-pay | Admitting: Physician Assistant

## 2012-11-29 NOTE — Telephone Encounter (Signed)
Received request from Nurse, documents faxed for surgical clearance. To: [facility] Fax number: [phone] Attention:Sherri 11/29/12/KM

## 2012-11-30 ENCOUNTER — Other Ambulatory Visit: Payer: Self-pay | Admitting: *Deleted

## 2012-12-04 MED ORDER — AMITRIPTYLINE HCL 25 MG PO TABS: 25.0000 mg | ORAL_TABLET | Freq: Every evening | ORAL | Status: DC | PRN

## 2012-12-05 ENCOUNTER — Ambulatory Visit (INDEPENDENT_AMBULATORY_CARE_PROVIDER_SITE_OTHER): Payer: Medicare Other | Admitting: Internal Medicine

## 2012-12-05 ENCOUNTER — Telehealth: Payer: Self-pay | Admitting: *Deleted

## 2012-12-05 ENCOUNTER — Encounter: Payer: Self-pay | Admitting: Internal Medicine

## 2012-12-05 VITALS — BP 130/77 | HR 103 | Temp 98.1°F | Ht 66.0 in | Wt 263.5 lb

## 2012-12-05 DIAGNOSIS — K219 Gastro-esophageal reflux disease without esophagitis: Secondary | ICD-10-CM

## 2012-12-05 DIAGNOSIS — E1140 Type 2 diabetes mellitus with diabetic neuropathy, unspecified: Secondary | ICD-10-CM

## 2012-12-05 DIAGNOSIS — M545 Low back pain, unspecified: Secondary | ICD-10-CM

## 2012-12-05 DIAGNOSIS — Z01818 Encounter for other preprocedural examination: Secondary | ICD-10-CM | POA: Insufficient documentation

## 2012-12-05 DIAGNOSIS — M549 Dorsalgia, unspecified: Secondary | ICD-10-CM

## 2012-12-05 DIAGNOSIS — E1165 Type 2 diabetes mellitus with hyperglycemia: Secondary | ICD-10-CM

## 2012-12-05 DIAGNOSIS — Z1239 Encounter for other screening for malignant neoplasm of breast: Secondary | ICD-10-CM | POA: Insufficient documentation

## 2012-12-05 DIAGNOSIS — E1149 Type 2 diabetes mellitus with other diabetic neurological complication: Secondary | ICD-10-CM

## 2012-12-05 DIAGNOSIS — E119 Type 2 diabetes mellitus without complications: Secondary | ICD-10-CM

## 2012-12-05 DIAGNOSIS — E1142 Type 2 diabetes mellitus with diabetic polyneuropathy: Secondary | ICD-10-CM

## 2012-12-05 DIAGNOSIS — I10 Essential (primary) hypertension: Secondary | ICD-10-CM

## 2012-12-05 DIAGNOSIS — F32A Depression, unspecified: Secondary | ICD-10-CM

## 2012-12-05 DIAGNOSIS — F329 Major depressive disorder, single episode, unspecified: Secondary | ICD-10-CM

## 2012-12-05 LAB — BASIC METABOLIC PANEL WITH GFR
BUN: 24 mg/dL — ABNORMAL HIGH (ref 6–23)
Chloride: 98 mEq/L (ref 96–112)
GFR, Est African American: 82 mL/min
GFR, Est Non African American: 71 mL/min
Potassium: 4.7 mEq/L (ref 3.5–5.3)
Sodium: 136 mEq/L (ref 135–145)

## 2012-12-05 LAB — TSH: TSH: 2.803 u[IU]/mL (ref 0.350–4.500)

## 2012-12-05 LAB — POCT GLYCOSYLATED HEMOGLOBIN (HGB A1C): Hemoglobin A1C: 8.2

## 2012-12-05 MED ORDER — FUROSEMIDE 40 MG PO TABS: 20.0000 mg | ORAL_TABLET | Freq: Every day | ORAL | Status: DC

## 2012-12-05 MED ORDER — OMEPRAZOLE 20 MG PO CPDR: 20.0000 mg | DELAYED_RELEASE_CAPSULE | Freq: Every day | ORAL | Status: DC

## 2012-12-05 MED ORDER — GLUCOSE BLOOD VI STRP: ORAL_STRIP | Status: DC

## 2012-12-05 MED ORDER — ACCU-CHEK MULTICLIX LANCETS MISC: Status: DC

## 2012-12-05 NOTE — Assessment & Plan Note (Signed)
Lab Results  Component Value Date   HGBA1C 8.2 12/05/2012   HGBA1C 9.2* 09/07/2012   HGBA1C 8.9 06/15/2012     Assessment: Diabetes control: fair control Progress toward A1C goal:  improved Comments:   Plan: Medications:  continue current medications Home glucose monitoring: Frequency: no home glucose monitoring Timing:   Instruction/counseling given: reminded to get eye exam, reminded to bring blood glucose meter & log to each visit, reminded to bring medications to each visit, discussed foot care, discussed the need for weight loss and discussed diet Educational resources provided: brochure Self management tools provided: home glucose logbook Other plans:   Reports that she's doing well.  Denies polydipsia or polyuria.  Denies any hypoglycemic events.  She reports medical compliance to her treatment. She states that her meter is broken and she has not been checking her sugars.  - Her A1c is 8.2 today which is better than before. - Will give a prescription for meter, lancets and strips - Will ask patient to set up appointment with Lupita Leash for education on checking blood sugars and possible insulin titration. - will not titrate her insulin during this visit since I don't have any data (CBGS).  And the patient don't feel comfortable to titrate her insulin before her right knee surgery.

## 2012-12-05 NOTE — Telephone Encounter (Signed)
Call from pt - states she needs rx for Accu-Chek meter.  Thanks

## 2012-12-05 NOTE — Assessment & Plan Note (Addendum)
Patient is here for preoperative clearance before right knee elective surgery.  She has been evaluated by her Cardiologist on 11/28/12 with the office note indicating " According to ACC/AHA guidelines, she may proceed with her noncardiac surgery at acceptable risk. If needed, she may hold her aspirin 5-7 days prior to her procedure and restart afterward when acceptable. Our services available as needed.", and a document faxed to her Orthopedic surgeon per chart review.   - I think the main preoperative clearance should be cardiology clearance given her history of non-obstructive coronary artery disease and CHF.  -She has a history of hypertension and diabetes.  Her A1c is 8.2 today.  We would love to help managing her hypertension and diabetes as needed should she is admitted to hospital.  - I will check her BMP and TSH today.

## 2012-12-05 NOTE — Progress Notes (Signed)
Subjective:   Patient ID: [name] female   DOB: [birth year] 52 y.o.   MRN: [id number]  HPI: [name] is a 52 y.o. woman with PMH of diabetes, hypertension, hyperlipidemia, non-obstructive coronary artery disease, CHF/NICM followed by [facility]ology, and obesity, who presents to clinic for followup visit.  # Surgical clearance She reports that she is doing fine, and does not have any discomfort or complaints.  She is here for surgical clearance for elective right knee surgery.   Of note, she was last evaluated by her cardiologist office on all 11/28/2012, and a surgical clearance evaluation was also done during this visit per chart review.  # DM II She's doing fine.  Denies polydipsia or polyuria.  Denies hypoglycemic event.  #Health maintenance 1. Pap smear and colonoscopy will be considered after her knee surgery.  She may need cardiology clearance before colonoscopy is arranged.  2. we'll send referral for mammogram 3. she will contact her ophthalmologist for annual exam    Past Medical History  Diagnosis Date  . Hypertension   . Hyperlipidemia   . GERD (gastroesophageal reflux disease)   . Adhesive capsulitis of shoulder     bilateral, Steroid injection Dr. Nedra [name] 1/12 bilaterally  . Peripheral neuropathy   . Adhesive capsulitis of right shoulder     Steroid injection Dr. Nedra [name] 1/12  . Adhesive capsulitis of left shoulder     Steroid injection Dr. Nedra [name] 1/12  . Obesity   . Diabetic retinopathy   . Glaucoma   . Diabetes mellitus     Type II, insulin dependent  . CAD (coronary artery disease)     nonobstructive. Last cardiac cath (2008) showing left circumflex with mid 50% stenosis and distal luminal irregularities. Also with RCA with mid to distal 30-40% stenosis. // Previously evaluated by [name], never followed up outpatient.  . CHF (congestive heart failure)   . CAP (community acquired pneumonia) 06/15/2012    05/2012 CXR: Mild opacification of the  posterior lung base on the lateral film  as cannot exclude infection/atelectasis.     Current Outpatient Prescriptions  Medication Sig Dispense Refill  . acetaminophen (TYLENOL) 500 MG tablet Take 1 tablet (500 mg total) by mouth every 6 (six) hours as needed for pain.  30 tablet  0  . amitriptyline (ELAVIL) 25 MG tablet Take 1 tablet (25 mg total) by mouth at bedtime as needed for sleep.  30 tablet  0  . aspirin 81 MG tablet Take 1 tablet (81 mg total) by mouth daily.  30 tablet  11  . carvedilol (COREG) 6.25 MG tablet Take 1 tablet (6.25 mg total) by mouth 2 (two) times daily.  60 tablet  11  . dorzolamide-timolol (COSOPT) 22.3-6.8 MG/ML ophthalmic solution Place 1 drop into the right eye 2 (two) times daily.      . ferrous sulfate 325 (65 FE) MG tablet Take 1 tablet (325 mg total) by mouth daily with breakfast.  30 tablet  11  . furosemide (LASIX) 40 MG tablet Take 0.5 tablets (20 mg total) by mouth daily.  30 tablet  2  . gabapentin (NEURONTIN) 600 MG tablet Take 0.5 tablets (300 mg total) by mouth 2 (two) times daily.  90 tablet  6  . HYDROcodone-acetaminophen (NORCO/VICODIN) 5-325 MG per tablet Take 2 tablets by mouth every 8 (eight) hours as needed for pain.  60 tablet  0  . insulin aspart (NOVOLOG) 100 UNIT/ML injection Inject 4-6 Units into the skin 3 (three) times  daily before meals. 4 units in the morning and 6 units in the evening      . insulin glargine (LANTUS) 100 UNIT/ML injection Inject 0.43 mLs (43 Units total) into the skin at bedtime.  10 mL  12  . lisinopril (PRINIVIL,ZESTRIL) 20 MG tablet Take 10 mg by mouth daily.       . metFORMIN (GLUCOPHAGE) 1000 MG tablet 1,000 mg 2 (two) times daily with a meal.       . omeprazole (PRILOSEC) 20 MG capsule Take 1 capsule (20 mg total) by mouth daily.  30 capsule  11  . pravastatin (PRAVACHOL) 20 MG tablet Take 1 tablet (20 mg total) by mouth daily.  30 tablet  6  . traMADol (ULTRAM) 50 MG tablet Take 1 tablet (50 mg total) by mouth every  6 (six) hours as needed. For pain  60 tablet  2   No current facility-administered medications for this visit.   Family History  Problem Relation Age of Onset  . Hypertension Sister   . Hypertension Sister   . Hypertension Sister    History   Social History  . Marital Status: Single    Spouse Name: N/A    Number of Children: 2  . Years of Education: 12th grade   Occupational History  . 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    Social History Main Topics  . Smoking status: Former Smoker -- 0.30 packs/day for 7 years    Types: Cigarettes    Quit date: 06/27/2006  . Smokeless tobacco: Never Used  . Alcohol Use: No  . Drug Use: No  . Sexually Active: Not Currently   Other Topics Concern  . None   Social History Narrative   Lives with her kids, 28yo daughter and 78 yo adopted son.   Review of Systems: Review of Systems:  Constitutional:  Denies fever, chills, diaphoresis, appetite change and fatigue.   HEENT:  Denies congestion, sore throat, rhinorrhea, sneezing, mouth sores, trouble swallowing, neck pain   Respiratory:  Denies SOB, DOE, cough, and wheezing.   Cardiovascular:  Denies palpitations and leg swelling.   Gastrointestinal:  Denies nausea, vomiting, abdominal pain, diarrhea, constipation, blood in stool and abdominal distention.   Genitourinary:  Denies dysuria, urgency, frequency, hematuria, flank pain and difficulty urinating.   Musculoskeletal:  Denies myalgias, back pain, joint swelling, arthralgias and gait problem.   Skin:  Denies pallor, rash and wound.   Neurological:  Denies dizziness, seizures, syncope, weakness, light-headedness, numbness and headaches.    .    Objective:  Physical Exam: Filed Vitals:   12/05/12 0819  BP: 130/77  Pulse: 103  Temp: 98.1 F (36.7 C)  TempSrc: Oral  Height: 5\' 6"  (1.676 m)  Weight: 263 lb 8 oz (119.523 kg)  SpO2: 98%   General: alert,  well-developed, and cooperative to examination.  Head: normocephalic and atraumatic.  Eyes: vision grossly intact, pupils equal, pupils round, pupils reactive to light, no injection and anicteric.  Mouth: pharynx pink and moist, no erythema, and no exudates.  Neck: supple, full ROM, no thyromegaly, no JVD, and no carotid bruits.  Lungs: normal respiratory effort, no accessory muscle use, normal breath sounds, no crackles, and no wheezes. Heart: normal rate, regular rhythm, no murmur, no gallop, and no rub.  Abdomen: soft, non-tender, normal bowel sounds, no distention, no guarding, no rebound tenderness, no hepatomegaly, and no splenomegaly.  Msk: no joint swelling,  no joint warmth, and no redness over joints.  Pulses: 2+ DP/PT pulses bilaterally Extremities: No cyanosis, clubbing, edema Neurologic: alert & oriented X3, cranial nerves II-XII intact, strength normal in all extremities, sensation intact to light touch, and gait normal.  Skin: turgor normal and no rashes.  Psych: Oriented X3, memory intact for recent and remote, normally interactive, good eye contact, not anxious appearing, and not depressed appearing.   Assessment & Plan:

## 2012-12-05 NOTE — Patient Instructions (Addendum)
1. Will follow up in 3 months 2. Will fax the office note to your Surgeon 3. Please set up appt with Lupita Leash     Treatment Goals:  Goals (1 Years of Data) as of 12/05/12         As of Today 11/28/12 11/05/12 11/05/12 10/29/12     Blood Pressure    . Blood Pressure < 140/90  130/77 128/80 131/81 144/62 144/77     Lifestyle    . Prevent Falls           Result Component    . HEMOGLOBIN A1C < 7.0  8.2        . LDL CALC < 100            Progress Toward Treatment Goals:  Treatment Goal 12/05/2012  Hemoglobin A1C improved  Blood pressure at goal  Prevent falls at goal    Self Care Goals & Plans:  Self Care Goal 12/05/2012  Manage my medications take my medicines as prescribed; refill my medications on time  Monitor my health keep track of my blood glucose; bring my glucose meter and log to each visit  Eat healthy foods eat baked foods instead of fried foods; eat foods that are low in salt  Be physically active find an activity I enjoy  Meeting treatment goals maintain the current self-care plan    Home Blood Glucose Monitoring 12/05/2012  Check my blood sugar no home glucose monitoring  When to check my blood sugar -     Care Management & Community Referrals:  Referral 12/05/2012  Referrals made for care management support diabetes educator

## 2012-12-06 ENCOUNTER — Other Ambulatory Visit: Payer: Self-pay | Admitting: Internal Medicine

## 2012-12-06 DIAGNOSIS — E1165 Type 2 diabetes mellitus with hyperglycemia: Secondary | ICD-10-CM

## 2012-12-06 DIAGNOSIS — E1140 Type 2 diabetes mellitus with diabetic neuropathy, unspecified: Secondary | ICD-10-CM

## 2012-12-06 MED ORDER — ACCU-CHEK AVIVA PLUS W/DEVICE KIT: 1.0000 | PACK | Freq: Two times a day (BID) | Status: DC

## 2012-12-06 MED ORDER — ACCU-CHEK MULTICLIX LANCETS MISC: Status: DC

## 2012-12-06 NOTE — Telephone Encounter (Signed)
Rx sent. Thanks

## 2012-12-11 ENCOUNTER — Other Ambulatory Visit: Payer: Self-pay | Admitting: *Deleted

## 2012-12-11 DIAGNOSIS — E1165 Type 2 diabetes mellitus with hyperglycemia: Secondary | ICD-10-CM

## 2012-12-11 DIAGNOSIS — E1140 Type 2 diabetes mellitus with diabetic neuropathy, unspecified: Secondary | ICD-10-CM

## 2012-12-12 ENCOUNTER — Ambulatory Visit (HOSPITAL_COMMUNITY)
Admission: RE | Admit: 2012-12-12 | Discharge: 2012-12-12 | Disposition: A | Discharge status: Home / Self Care | Payer: Medicare Other | Source: Ambulatory Visit | Attending: Internal Medicine | Admitting: Internal Medicine

## 2012-12-12 ENCOUNTER — Ambulatory Visit (HOSPITAL_COMMUNITY): Payer: Medicare Other

## 2012-12-12 DIAGNOSIS — Z1239 Encounter for other screening for malignant neoplasm of breast: Secondary | ICD-10-CM

## 2012-12-12 DIAGNOSIS — Z1231 Encounter for screening mammogram for malignant neoplasm of breast: Secondary | ICD-10-CM | POA: Insufficient documentation

## 2012-12-12 MED ORDER — METFORMIN HCL 1000 MG PO TABS: 1000.0000 mg | ORAL_TABLET | Freq: Two times a day (BID) | ORAL | Status: DC

## 2012-12-13 ENCOUNTER — Telehealth: Payer: Self-pay | Admitting: *Deleted

## 2012-12-13 NOTE — Telephone Encounter (Signed)
Call from pt - asking about Omeprazole rx; it was refilled on 6/11. I called [facility] and pt awared.

## 2012-12-17 ENCOUNTER — Other Ambulatory Visit: Payer: Self-pay | Admitting: *Deleted

## 2012-12-17 MED ORDER — HYDROCODONE-ACETAMINOPHEN 5-325 MG PO TABS: 2.0000 | ORAL_TABLET | Freq: Two times a day (BID) | ORAL | Status: DC | PRN

## 2012-12-17 NOTE — Progress Notes (Signed)
Pt states she is waiting to hear when her surgery will be scheduled by [facility], she had initial appt, but had to be cleared by all her physicians.  She states her knee is hurting significantly, having problems walking due to pain.  Per Dr. Jennette Kettle called in rx for vicodin.  Pt notified of appt info.

## 2012-12-19 NOTE — Progress Notes (Signed)
TEACHING ATTENDING ADDENDUM: I discussed this case with Dr. Li at the time of the patient visit. I agree with the HPI, exam findings and have read the documentation provided by the resident,  and I concur with the plan of care. Please see the resident note for details of management.      

## 2013-01-01 ENCOUNTER — Other Ambulatory Visit: Payer: Self-pay | Admitting: Internal Medicine

## 2013-01-03 ENCOUNTER — Other Ambulatory Visit: Payer: Self-pay

## 2013-01-08 ENCOUNTER — Telehealth: Payer: Self-pay | Admitting: *Deleted

## 2013-01-08 NOTE — Telephone Encounter (Signed)
Pt states her ortho md needs clearance faxed to them so she can have R knee surgery. She states they have postponed the surgery because they have not rec'd the medical clearance from [facility], please advise

## 2013-01-08 NOTE — Telephone Encounter (Signed)
Please fax Dr. Stevenson Clinch note from 12/05/12 and Dr. Natale Lay (Cardiology) note from 11/28/12. Both of them deal with pre-op clearance.

## 2013-01-09 NOTE — Telephone Encounter (Signed)
Faxed [facility] office visit note to [phone]

## 2013-01-11 ENCOUNTER — Other Ambulatory Visit: Payer: Self-pay | Admitting: *Deleted

## 2013-01-12 MED ORDER — PRAVASTATIN SODIUM 20 MG PO TABS: 20.0000 mg | ORAL_TABLET | Freq: Every day | ORAL | Status: DC

## 2013-01-29 ENCOUNTER — Other Ambulatory Visit: Payer: Self-pay | Admitting: Internal Medicine

## 2013-01-29 NOTE — Telephone Encounter (Signed)
Will refill x 1.  This med needs to be reviewed for possible refills and for continued need by Dr. Bosie Clos.   Thanks - -   EBM

## 2013-02-27 ENCOUNTER — Other Ambulatory Visit: Payer: Self-pay | Admitting: *Deleted

## 2013-03-01 MED ORDER — INSULIN ASPART 100 UNIT/ML ~~LOC~~ SOLN: 4.0000 [IU] | Freq: Three times a day (TID) | SUBCUTANEOUS | Status: DC

## 2013-03-01 MED ORDER — AMITRIPTYLINE HCL 25 MG PO TABS: ORAL_TABLET | ORAL | Status: DC

## 2013-03-07 ENCOUNTER — Telehealth: Payer: Self-pay | Admitting: *Deleted

## 2013-03-07 NOTE — Telephone Encounter (Signed)
Call from pt requesting an appt for tomorrow - c/o back pain and pain upon urination. Appt given for tomorrow w/Dr Clyde Lundborg at [phone]..

## 2013-03-08 ENCOUNTER — Other Ambulatory Visit (HOSPITAL_COMMUNITY)
Admission: RE | Admit: 2013-03-08 | Discharge: 2013-03-08 | Disposition: A | Discharge status: Home / Self Care | Payer: Medicare Other | Source: Ambulatory Visit | Attending: Internal Medicine | Admitting: Internal Medicine

## 2013-03-08 ENCOUNTER — Ambulatory Visit (INDEPENDENT_AMBULATORY_CARE_PROVIDER_SITE_OTHER): Payer: Medicare Other | Admitting: Internal Medicine

## 2013-03-08 ENCOUNTER — Encounter: Payer: Self-pay | Admitting: Internal Medicine

## 2013-03-08 VITALS — BP 126/74 | HR 106 | Temp 98.0°F | Ht 65.25 in | Wt 262.3 lb

## 2013-03-08 DIAGNOSIS — I2510 Atherosclerotic heart disease of native coronary artery without angina pectoris: Secondary | ICD-10-CM

## 2013-03-08 DIAGNOSIS — I10 Essential (primary) hypertension: Secondary | ICD-10-CM

## 2013-03-08 DIAGNOSIS — Z792 Long term (current) use of antibiotics: Secondary | ICD-10-CM

## 2013-03-08 DIAGNOSIS — Z23 Encounter for immunization: Secondary | ICD-10-CM

## 2013-03-08 DIAGNOSIS — E1149 Type 2 diabetes mellitus with other diabetic neurological complication: Secondary | ICD-10-CM

## 2013-03-08 DIAGNOSIS — Z124 Encounter for screening for malignant neoplasm of cervix: Secondary | ICD-10-CM | POA: Insufficient documentation

## 2013-03-08 DIAGNOSIS — E1165 Type 2 diabetes mellitus with hyperglycemia: Secondary | ICD-10-CM

## 2013-03-08 DIAGNOSIS — Z299 Encounter for prophylactic measures, unspecified: Secondary | ICD-10-CM

## 2013-03-08 DIAGNOSIS — N390 Urinary tract infection, site not specified: Secondary | ICD-10-CM

## 2013-03-08 DIAGNOSIS — E119 Type 2 diabetes mellitus without complications: Secondary | ICD-10-CM

## 2013-03-08 DIAGNOSIS — E1140 Type 2 diabetes mellitus with diabetic neuropathy, unspecified: Secondary | ICD-10-CM

## 2013-03-08 DIAGNOSIS — R350 Frequency of micturition: Secondary | ICD-10-CM

## 2013-03-08 DIAGNOSIS — Z1151 Encounter for screening for human papillomavirus (HPV): Secondary | ICD-10-CM | POA: Insufficient documentation

## 2013-03-08 DIAGNOSIS — E1142 Type 2 diabetes mellitus with diabetic polyneuropathy: Secondary | ICD-10-CM

## 2013-03-08 LAB — POCT URINALYSIS DIPSTICK
Bilirubin, UA: NEGATIVE
Glucose, UA: 250
Ketones, UA: NEGATIVE
Nitrite, UA: NEGATIVE
pH, UA: 5.5

## 2013-03-08 LAB — GLUCOSE, CAPILLARY: Glucose-Capillary: 230 mg/dL — ABNORMAL HIGH (ref 70–99)

## 2013-03-08 MED ORDER — SULFAMETHOXAZOLE-TMP DS 800-160 MG PO TABS: 1.0000 | ORAL_TABLET | Freq: Two times a day (BID) | ORAL | Status: DC

## 2013-03-08 NOTE — Assessment & Plan Note (Signed)
Lab Results  Component Value Date   HGBA1C 8.5 03/08/2013   HGBA1C 8.2 12/05/2012   HGBA1C 9.2* 09/07/2012     Assessment: Diabetes control: fair control Progress toward A1C goal:  deteriorated Comments:   Plan: Medications:  continue current medications Home glucose monitoring: none Frequency:   Timing:   Instruction/counseling given: reminded to bring blood glucose meter & log to each visit Educational resources provided: brochure;handout Self management tools provided:   Other plans: A1C deteriorated from 8.2 to 8.5 today. Patient has not measured her blood sugar at home. It is difficult to make adjustments for her medications today. I convinced patient to measure her blood sugar 3 to 4 times a day for at least 3 days before her next visit. She agreed to do so.

## 2013-03-08 NOTE — Progress Notes (Signed)
Patient ID: [name], female   DOB: Jan 26, 1961, 52 y.o.   MRN: [id number] Subjective:   Patient ID: [name] female   DOB: [birth year] 52 y.o.   MRN: [id number]  CC:  Follow up visit.   HPI:  [name] is a 52 y.o. lady with past medical history as outlined below, who presents for a followup visit today   1. DM-II: A1c 8.2 on 12/05/12. Currently patient is taking metformin 1000 mg twice a day and Lantus 43 units QHS. She did not bring in her log meter today. She does not have symptoms of hypo- or hyperglycemia today.  2. CAD: patient had cardiac cath 2008 which showed moderate nonobstructive LCX/RCA disease (mid LCX/OM2 with 50% mid stenosis and mid to distal RCA with 30-40% stenosis) and Normal LV systolic function with an EF of 55%. Recent 2D echo on 09/07/12 showed normal EF. She is currently taking low dose of Lasix 20 mg daily. She is also on aspirin and Coreg. She does not have chest pain, shortness of breath and palpitation.   3. HTN: Currently patient is taking lisinopril 10 mg daily, Coreg 6.25 mg twice a day, Lasix 20 mg daily. Her bp is 126/74 mmHg.   4. HLD: Currently patient is taking pravastatin 20 mg daily. Her recent LDL was 84 on 09/07/12. Her AST and ALT were normal on 3/ 20/14. She does not have any side effects, such as muscle pain.  5. Increased urinary frequency: Patient has increased urinary frequency and urgency for several days. No burning on urination or dysuria.  He also has some mild tenderness over flank area bilaterally. No fever or chill. Urine dipstick has small leukocyte and is positive for blood, but negative for protein and nitrite.    ROS: Denies fever, chills, fatigue, headaches,  cough, chest pain, SOB,  abdominal pain, diarrhea, constipation. She has mild right knee pain.     Past Medical History  Diagnosis Date  . Hypertension   . Hyperlipidemia   . GERD (gastroesophageal reflux disease)   . Adhesive capsulitis of shoulder    bilateral, Steroid injection Dr. Nedra Hai 1/12 bilaterally  . Peripheral neuropathy   . Adhesive capsulitis of right shoulder     Steroid injection Dr. Nedra Hai 1/12  . Adhesive capsulitis of left shoulder     Steroid injection Dr. Nedra Hai 1/12  . Obesity   . Diabetic retinopathy   . Glaucoma   . Diabetes mellitus     Type II, insulin dependent  . CAD (coronary artery disease)     nonobstructive. Last cardiac cath (2008) showing left circumflex with mid 50% stenosis and distal luminal irregularities. Also with RCA with mid to distal 30-40% stenosis. // Previously evaluated by [name], never followed up outpatient.  . CHF (congestive heart failure)   . CAP (community acquired pneumonia) 06/15/2012    05/2012 CXR: Mild opacification of the posterior lung base on the lateral film  as cannot exclude infection/atelectasis.     Current Outpatient Prescriptions  Medication Sig Dispense Refill  . amitriptyline (ELAVIL) 25 MG tablet TAKE 1 TABLET BY MOUTH AT BEDTIME AS NEEDED FOR SLEEP  30 tablet  0  . aspirin 81 MG tablet Take 1 tablet (81 mg total) by mouth daily.  30 tablet  11  . carvedilol (COREG) 6.25 MG tablet Take 1 tablet (6.25 mg total) by mouth 2 (two) times daily.  60 tablet  11  . dorzolamide-timolol (COSOPT) 22.3-6.8 MG/ML ophthalmic solution Place 1 drop  into the right eye 2 (two) times daily.      . ferrous sulfate 325 (65 FE) MG tablet Take 1 tablet (325 mg total) by mouth daily with breakfast.  30 tablet  11  . furosemide (LASIX) 40 MG tablet Take 0.5 tablets (20 mg total) by mouth daily.  30 tablet  2  . gabapentin (NEURONTIN) 600 MG tablet Take 0.5 tablets (300 mg total) by mouth 2 (two) times daily.  90 tablet  6  . HYDROcodone-acetaminophen (NORCO/VICODIN) 5-325 MG per tablet Take 2 tablets by mouth every 12 (twelve) hours as needed for pain.  120 tablet  1  . insulin aspart (NOVOLOG) 100 UNIT/ML injection Inject 4-6 Units into the skin 3 (three) times daily before meals. 4 units  in the morning and 6 units in the evening  1 vial  6  . insulin glargine (LANTUS) 100 UNIT/ML injection Inject 0.43 mLs (43 Units total) into the skin at bedtime.  10 mL  12  . lisinopril (PRINIVIL,ZESTRIL) 20 MG tablet Take 10 mg by mouth daily.       . metFORMIN (GLUCOPHAGE) 1000 MG tablet Take 1 tablet (1,000 mg total) by mouth 2 (two) times daily with a meal.  60 tablet  5  . omeprazole (PRILOSEC) 20 MG capsule Take 1 capsule (20 mg total) by mouth daily.  30 capsule  11  . pravastatin (PRAVACHOL) 20 MG tablet Take 1 tablet (20 mg total) by mouth daily.  30 tablet  6  . traMADol (ULTRAM) 50 MG tablet Take 1 tablet (50 mg total) by mouth every 6 (six) hours as needed. For pain  60 tablet  2  . Blood Glucose Monitoring Suppl (ACCU-CHEK AVIVA PLUS) W/DEVICE KIT 1 Device by Does not apply route 2 times daily at 12 noon and 4 pm.  1 kit  0  . glucose blood test strip Use as instructed  100 each  12  . Lancets (ACCU-CHEK MULTICLIX) lancets Use as instructed  100 each  12  . sulfamethoxazole-trimethoprim (BACTRIM DS) 800-160 MG per tablet Take 1 tablet by mouth 2 (two) times daily.  6 tablet  0   No current facility-administered medications for this visit.   Family History  Problem Relation Age of Onset  . Hypertension Sister   . Hypertension Sister   . Hypertension Sister    History   Social History  . Marital Status: Single    Spouse Name: N/A    Number of Children: 2  . Years of Education: 12th grade   Occupational History  . 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    Social History Main Topics  . Smoking status: Former Smoker -- 0.30 packs/day for 7 years    Types: Cigarettes    Quit date: 06/27/2006  . Smokeless tobacco: Never Used  . Alcohol Use: No  . Drug Use: No  . Sexual Activity: Not Currently   Other Topics Concern  . None   Social History Narrative   Lives with her kids, 28yo daughter and 53 yo  adopted son.    Review of Systems: Full 14-point review of systems otherwise negative. See HPI.   Objective:  Physical Exam: Filed Vitals:   03/08/13 1106  BP: 126/74  Pulse: 106  Temp: 98 F (36.7 C)  TempSrc: Oral  Height: 5' 5.25" (1.657 m)  Weight: 262 lb 4.8 oz (118.978 kg)  SpO2: 98%   General: alert,  well-developed, and cooperative to examination.  Head: normocephalic and atraumatic.  Eyes: vision grossly intact, pupils equal, pupils round, pupils reactive to light, no injection and anicteric.  Mouth: pharynx pink and moist, no erythema, and no exudates.  Neck: supple, full ROM, no thyromegaly, no JVD, and no carotid bruits.  Lungs: normal respiratory effort, no accessory muscle use, normal breath sounds, no crackles, and no wheezes. Heart: normal rate, regular rhythm, no murmur, no gallop, and no rub.  Abdomen: soft, non-tender, normal bowel sounds, no distention, no guarding, no rebound tenderness, no hepatomegaly, and no splenomegaly. CVA tenderness negative bilaterally.  Msk: no joint swelling, no joint warmth, and no redness over joints.  Pulses: 2+ DP/PT pulses bilaterally Extremities: has 1+ pitting edema in legs bilaterally. Mild tenderness over the right knee  Neurologic: alert & oriented X3, cranial nerves II-XII intact, strength normal in all extremities, sensation intact to light touch, and gait normal.  Skin: turgor normal and no rashes.  Psych: Oriented X3, memory intact for recent and remote, normally interactive, good eye contact, not anxious appearing, and not depressed appearing.   Assessment & Plan:

## 2013-03-08 NOTE — Assessment & Plan Note (Addendum)
BP Readings from Last 3 Encounters:  03/08/13 126/74  12/05/12 130/77  11/28/12 128/80    Lab Results  Component Value Date   NA 136 12/05/2012   K 4.7 12/05/2012   CREATININE 0.92 12/05/2012    Assessment: Blood pressure control: controlled Progress toward BP goal:  at goal Comments:   Plan: Medications:  continue current medications Educational resources provided: brochure;handout;video Self management tools provided:   Other plans: Blood pressure is well controlled. Will continue current regimen.

## 2013-03-08 NOTE — Assessment & Plan Note (Signed)
Patient's symptoms of urinary urgency and increased urinary frequency are consistent with a UTI. Her urine dipstick is positive for leukocyte and blood. No fever or chills No CVA tenderness, less likely to have pyelonephritis.  -will treat with Bactrim for 3 days -Will get urinalysis and urine culture given her uncontrolled diabetes.

## 2013-03-08 NOTE — Addendum Note (Signed)
Addended by: Lorretta Harp on: 03/08/2013 12:23 PM   Modules accepted: Orders

## 2013-03-08 NOTE — Assessment & Plan Note (Signed)
It is stable. No chest pain, shortness of breath, or palpitation. Will continue aspirin, beta blocker and pravastatin.

## 2013-03-08 NOTE — Assessment & Plan Note (Signed)
-  Did Pap smear -Checked A1c, Microalbumin/Cre ratio -Gave flu shot

## 2013-03-08 NOTE — Patient Instructions (Signed)
1. Please take Bactrim for your UTI for 3 days. 2. Please measure your blood sugar 3 to 4 times a day for at least 3 days before you come back for next visit.  3. If you have worsening of your symptoms or new symptoms arise, please call the clinic ([phone]), or go to the ER immediately if symptoms are severe.  You have done great job in taking all your medications. I appreciate it very much. Please continue doing that.

## 2013-03-09 LAB — URINALYSIS, ROUTINE W REFLEX MICROSCOPIC
Bilirubin Urine: NEGATIVE
Nitrite: NEGATIVE
Protein, ur: NEGATIVE mg/dL
Specific Gravity, Urine: 1.018 (ref 1.005–1.030)
Urobilinogen, UA: 0.2 mg/dL (ref 0.0–1.0)
pH: 5 (ref 5.0–8.0)

## 2013-03-09 LAB — URINALYSIS, MICROSCOPIC ONLY: Squamous Epithelial / LPF: NONE SEEN

## 2013-03-09 LAB — URINE CULTURE: Colony Count: >=100000

## 2013-03-09 LAB — MICROALBUMIN / CREATININE URINE RATIO
Creatinine, Urine: 160.2 mg/dL
Microalb, Ur: 2.35 mg/dL — ABNORMAL HIGH (ref 0.00–1.89)

## 2013-03-10 NOTE — Progress Notes (Signed)
Case discussed with Dr. Niu at the time of the visit.  We reviewed the resident's history and exam and pertinent patient test results.  I agree with the assessment, diagnosis, and plan of care documented in the resident's note.    

## 2013-03-11 ENCOUNTER — Other Ambulatory Visit: Payer: Self-pay

## 2013-03-11 DIAGNOSIS — I10 Essential (primary) hypertension: Secondary | ICD-10-CM

## 2013-03-11 DIAGNOSIS — I2510 Atherosclerotic heart disease of native coronary artery without angina pectoris: Secondary | ICD-10-CM

## 2013-03-11 MED ORDER — FUROSEMIDE 40 MG PO TABS: 20.0000 mg | ORAL_TABLET | Freq: Every day | ORAL | Status: DC

## 2013-03-11 MED ORDER — CARVEDILOL 6.25 MG PO TABS: 6.2500 mg | ORAL_TABLET | Freq: Two times a day (BID) | ORAL | Status: DC

## 2013-03-29 ENCOUNTER — Encounter: Payer: Self-pay | Admitting: Family Medicine

## 2013-03-29 ENCOUNTER — Ambulatory Visit (INDEPENDENT_AMBULATORY_CARE_PROVIDER_SITE_OTHER): Payer: Medicare Other | Admitting: Family Medicine

## 2013-03-29 VITALS — BP 136/82 | Ht 66.0 in | Wt 230.0 lb

## 2013-03-29 DIAGNOSIS — M25469 Effusion, unspecified knee: Secondary | ICD-10-CM

## 2013-03-29 DIAGNOSIS — M25461 Effusion, right knee: Secondary | ICD-10-CM

## 2013-03-29 MED ORDER — METHYLPREDNISOLONE ACETATE 40 MG/ML IJ SUSP
40.0000 mg | Freq: Once | INTRAMUSCULAR | Status: AC
Start: 2013-03-29 — End: 2013-03-29
  Administered 2013-03-29: 40 mg via INTRA_ARTICULAR

## 2013-03-29 NOTE — Progress Notes (Signed)
  Subjective:    Patient ID: [name], female    DOB: 1960-09-28, 52 y.o.   MRN: [id number]  HPI Is a 52 year old female approximately 2 months status post right knee arthroscopy, who presents with a complaint of right knee pain and swelling. Approximately one week ago she bumped into a table with her right knee since had significant swelling and pain since then pain is worse with ambulation or range of motion. Also direct palpation of the knee causes an increase in pain. Should surgery per Dr. Fredric Mare and had an uncomplicated postop course.  Has not taken any pain medication. Pain has not improved significantly.  Past Medical History  Diagnosis Date  . Hypertension   . Hyperlipidemia   . GERD (gastroesophageal reflux disease)   . Adhesive capsulitis of shoulder     bilateral, Steroid injection Dr. Nedra Hai 1/12 bilaterally  . Peripheral neuropathy   . Adhesive capsulitis of right shoulder     Steroid injection Dr. Nedra Hai 1/12  . Adhesive capsulitis of left shoulder     Steroid injection Dr. Nedra Hai 1/12  . Obesity   . Diabetic retinopathy   . Glaucoma   . Diabetes mellitus     Type II, insulin dependent  . CAD (coronary artery disease)     nonobstructive. Last cardiac cath (2008) showing left circumflex with mid 50% stenosis and distal luminal irregularities. Also with RCA with mid to distal 30-40% stenosis. // Previously evaluated by [facility], never followed up outpatient.  . CHF (congestive heart failure)   . CAP (community acquired pneumonia) 06/15/2012    05/2012 CXR: Mild opacification of the posterior lung base on the lateral film  as cannot exclude infection/atelectasis.     Past Surgical History  Procedure Laterality Date  . Cataract extraction    . Glaucoma surgery    . Cardiac catheterization    . Tubal ligation     No Known Allergies    Review of Systems Review of systems as per history of present illness otherwise negative    Objective:   Physical  Exam  BP 136/82  Ht 5\' 6"  (1.676 m)  Wt 230 lb (104.327 kg)  BMI 37.14 kg/m2  LMP 02/13/2011 Some well-developed well-nourished 52 year old [place] female awake alert oriented no acute distress  Right Knee: Small effusion with no evidence of erythema. Palpation with tenderness to the patella and anterior knee. ROM full in flexion and extension and lower leg rotation. Ligaments with solid consistent endpoints including ACL, PCL, LCL, MCL. Negative Mcmurray's. painful patellar compression. Patellar glide without crepitus. Patellar and quadriceps tendons unremarkable. Hamstring and quadriceps strength is normal.   Neurovascularly intact bilateral lower extremities  Knee Aspiration and Injection Patient verbally consented; risks, benefits, and alternatives explained including possible infection. Patient prepped with Chloraprep. Ethyl chloride for anesthesia. 5cc of 1% Lidocaine used in wheal then injected Subcutaneous fashion with 22 gauge needle on lateral approach. Under sterilne conditions, 18 gauge needle used via lateral approach to aspirate 50 cc of yellow noncloudy fluid. Then 1 cc of Depo-Medrol 40 mg injected. Tolerated well, decreased pain, no complications.  Assessment and plan: Right knee effusion status post traumatic event and recent surgery. Patient's knee effusion aspirated and an injection done with 1 cc Depo-Medrol. Patient will followup as needed.

## 2013-04-04 ENCOUNTER — Encounter: Payer: Self-pay | Admitting: Dietician

## 2013-04-04 DIAGNOSIS — E113499 Type 2 diabetes mellitus with severe nonproliferative diabetic retinopathy without macular edema, unspecified eye: Secondary | ICD-10-CM | POA: Insufficient documentation

## 2013-04-04 DIAGNOSIS — H401190 Primary open-angle glaucoma, unspecified eye, stage unspecified: Secondary | ICD-10-CM | POA: Insufficient documentation

## 2013-04-05 ENCOUNTER — Ambulatory Visit: Payer: Medicare Other | Admitting: Internal Medicine

## 2013-04-05 ENCOUNTER — Other Ambulatory Visit: Payer: Self-pay | Admitting: Internal Medicine

## 2013-04-05 ENCOUNTER — Encounter: Payer: Self-pay | Admitting: Internal Medicine

## 2013-04-25 ENCOUNTER — Telehealth: Payer: Self-pay | Admitting: Family Medicine

## 2013-04-25 ENCOUNTER — Telehealth: Payer: Self-pay | Admitting: *Deleted

## 2013-04-25 NOTE — Telephone Encounter (Signed)
Pt states she is having rt knee pain, she would like something sent to her pharmacy for pain

## 2013-04-25 NOTE — Telephone Encounter (Signed)
Re the phone call---if her knee is hurting again we need to see her. I think she saw Dr Lorri Frederick with me before--but any one of [place] would be OK THANKS! Denny Levy .

## 2013-04-26 ENCOUNTER — Ambulatory Visit: Payer: Medicare Other | Admitting: Family Medicine

## 2013-05-06 NOTE — Telephone Encounter (Signed)
Left pt a VM to return call.

## 2013-05-07 ENCOUNTER — Other Ambulatory Visit: Payer: Self-pay | Admitting: *Deleted

## 2013-05-07 ENCOUNTER — Other Ambulatory Visit: Payer: Self-pay | Admitting: Internal Medicine

## 2013-05-07 DIAGNOSIS — M549 Dorsalgia, unspecified: Secondary | ICD-10-CM

## 2013-05-07 MED ORDER — TRAMADOL HCL 50 MG PO TABS: 50.0000 mg | ORAL_TABLET | Freq: Four times a day (QID) | ORAL | Status: DC | PRN

## 2013-05-07 NOTE — Telephone Encounter (Signed)
Called to pharm 

## 2013-05-09 NOTE — Telephone Encounter (Signed)
Left pt a VM to return call.

## 2013-05-15 ENCOUNTER — Ambulatory Visit (INDEPENDENT_AMBULATORY_CARE_PROVIDER_SITE_OTHER): Payer: Medicare Other | Admitting: Internal Medicine

## 2013-05-15 ENCOUNTER — Encounter: Payer: Self-pay | Admitting: Internal Medicine

## 2013-05-15 VITALS — BP 122/71 | HR 103 | Temp 96.9°F | Ht 66.0 in | Wt 264.8 lb

## 2013-05-15 DIAGNOSIS — E1142 Type 2 diabetes mellitus with diabetic polyneuropathy: Secondary | ICD-10-CM

## 2013-05-15 DIAGNOSIS — M5430 Sciatica, unspecified side: Secondary | ICD-10-CM

## 2013-05-15 DIAGNOSIS — E1165 Type 2 diabetes mellitus with hyperglycemia: Secondary | ICD-10-CM

## 2013-05-15 DIAGNOSIS — E1149 Type 2 diabetes mellitus with other diabetic neurological complication: Secondary | ICD-10-CM

## 2013-05-15 DIAGNOSIS — M545 Low back pain, unspecified: Secondary | ICD-10-CM

## 2013-05-15 DIAGNOSIS — E1140 Type 2 diabetes mellitus with diabetic neuropathy, unspecified: Secondary | ICD-10-CM

## 2013-05-15 LAB — GLUCOSE, CAPILLARY: Glucose-Capillary: 209 mg/dL — ABNORMAL HIGH (ref 70–99)

## 2013-05-15 MED ORDER — HYDROCODONE-ACETAMINOPHEN 5-325 MG PO TABS: 1.0000 | ORAL_TABLET | Freq: Four times a day (QID) | ORAL | Status: DC | PRN

## 2013-05-15 NOTE — Patient Instructions (Signed)
Since the tramadol is not relieving your pain, we will renew the hydrocodone pain medicine. We have updated your pain contract today. Read below for tips and exercises to help with your back pain. Follow-up with me in December for your Diabetes.     Sciatica  The sciatic nerve runs from the back down the leg and is responsible for sensation and control of the muscles in the back (posterior) side of the thigh, lower leg, and foot. Sciatica is a condition that is characterized by inflammation of this nerve.  SYMPTOMS   Signs of nerve damage, including numbness and/or weakness along the posterior side of the lower extremity.  Pain in the back of the thigh that may also travel down the leg.  Pain that worsens when sitting for long periods of time.  Occasionally, pain in the back or buttock. CAUSES  Inflammation of the sciatic nerve is the cause of sciatica. The inflammation is due to something irritating the nerve. Common sources of irritation include:  Sitting for long periods of time.  Direct trauma to the nerve.  Arthritis of the spine.  Herniated or ruptured disk.  Slipping of the vertebrae (spondylolithesis)  Pressure from soft tissues, such as muscles or ligament-like tissue (fascia). RISK INCREASES WITH:  Sports that place pressure or stress on the spine (football or weightlifting).  Poor strength and flexibility.  Failure to warm-up properly before activity.  Family history of low back pain or disk disorders.  Previous back injury or surgery.  Poor body mechanics, especially when lifting, or poor posture. PREVENTION   Warm up and stretch properly before activity.  Maintain physical fitness:  Strength, flexibility, and endurance.  Cardiovascular fitness.  Learn and use proper technique, especially with posture and lifting. When possible, have coach correct improper technique.  Avoid activities that place stress on the spine. PROGNOSIS If treated  properly, then sciatica usually resolves within 6 weeks. However, occasionally surgery is necessary.  RELATED COMPLICATIONS   Permanent nerve damage, including pain, numbness, tingle, or weakness.  Chronic back pain.  Risks of surgery: infection, bleeding, nerve damage, or damage to surrounding tissues. TREATMENT Treatment initially involves resting from any activities that aggravate your symptoms. The use of ice and medication may help reduce pain and inflammation. The use of strengthening and stretching exercises may help reduce pain with activity. These exercises may be performed at home or with referral to a therapist. A therapist may recommend further treatments, such as transcutaneous electronic nerve stimulation (TENS) or ultrasound. Your caregiver may recommend corticosteroid injections to help reduce inflammation of the sciatic nerve. If symptoms persist despite non-surgical (conservative) treatment, then surgery may be recommended. MEDICATION  If pain medication is necessary, then nonsteroidal anti-inflammatory medications, such as aspirin and ibuprofen, or other minor pain relievers, such as acetaminophen, are often recommended.  Do not take pain medication for 7 days before surgery.  Prescription pain relievers may be given if deemed necessary by your caregiver. Use only as directed and only as much as you need.  Ointments applied to the skin may be helpful.  Corticosteroid injections may be given by your caregiver. These injections should be reserved for the most serious cases, because they may only be given a certain number of times. HEAT AND COLD  Cold treatment (icing) relieves pain and reduces inflammation. Cold treatment should be applied for 10 to 15 minutes every 2 to 3 hours for inflammation and pain and immediately after any activity that aggravates your symptoms. Use ice packs or  massage the area with a piece of ice (ice massage).  Heat treatment may be used prior to  performing the stretching and strengthening activities prescribed by your caregiver, physical therapist, or athletic trainer. Use a heat pack or soak the injury in warm water. SEEK MEDICAL CARE IF:  Treatment seems to offer no benefit, or the condition worsens.  Any medications produce adverse side effects. EXERCISES  RANGE OF MOTION (ROM) AND STRETCHING EXERCISES - Sciatica Most people with sciatic will find that their symptoms worsen with either excessive bending forward (flexion) or arching at the low back (extension). The exercises which will help resolve your symptoms will focus on the opposite motion. Your physician, physical therapist or athletic trainer will help you determine which exercises will be most helpful to resolve your low back pain. Do not complete any exercises without first consulting with your clinician. Discontinue any exercises which worsen your symptoms until you speak to your clinician. If you have pain, numbness or tingling which travels down into your buttocks, leg or foot, the goal of the therapy is for these symptoms to move closer to your back and eventually resolve. Occasionally, these leg symptoms will get better, but your low back pain may worsen; this is typically an indication of progress in your rehabilitation. Be certain to be very alert to any changes in your symptoms and the activities in which you participated in the 24 hours prior to the change. Sharing this information with your clinician will allow him/her to most efficiently treat your condition. These exercises may help you when beginning to rehabilitate your injury. Your symptoms may resolve with or without further involvement from your physician, physical therapist or athletic trainer. While completing these exercises, remember:   Restoring tissue flexibility helps normal motion to return to the joints. This allows healthier, less painful movement and activity.  An effective stretch should be held for at  least 30 seconds.  A stretch should never be painful. You should only feel a gentle lengthening or release in the stretched tissue. FLEXION RANGE OF MOTION AND STRETCHING EXERCISES: STRETCH  Flexion, Single Knee to Chest   Lie on a firm bed or floor with both legs extended in front of you.  Keeping one leg in contact with the floor, bring your opposite knee to your chest. Hold your leg in place by either grabbing behind your thigh or at your knee.  Pull until you feel a gentle stretch in your low back. Hold __________ seconds.  Slowly release your grasp and repeat the exercise with the opposite side. Repeat __________ times. Complete this exercise __________ times per day.  STRETCH  Flexion, Double Knee to Chest  Lie on a firm bed or floor with both legs extended in front of you.  Keeping one leg in contact with the floor, bring your opposite knee to your chest.  Tense your stomach muscles to support your back and then lift your other knee to your chest. Hold your legs in place by either grabbing behind your thighs or at your knees.  Pull both knees toward your chest until you feel a gentle stretch in your low back. Hold __________ seconds.  Tense your stomach muscles and slowly return one leg at a time to the floor. Repeat __________ times. Complete this exercise __________ times per day.  STRETCH  Low Trunk Rotation   Lie on a firm bed or floor. Keeping your legs in front of you, bend your knees so they are both pointed toward the  ceiling and your feet are flat on the floor.  Extend your arms out to the side. This will stabilize your upper body by keeping your shoulders in contact with the floor.  Gently and slowly drop both knees together to one side until you feel a gentle stretch in your low back. Hold for __________ seconds.  Tense your stomach muscles to support your low back as you bring your knees back to the starting position. Repeat the exercise to the other side. Repeat  __________ times. Complete this exercise __________ times per day  EXTENSION RANGE OF MOTION AND FLEXIBILITY EXERCISES: STRETCH  Extension, Prone on Elbows  Lie on your stomach on the floor, a bed will be too soft. Place your palms about shoulder width apart and at the height of your head.  Place your elbows under your shoulders. If this is too painful, stack pillows under your chest.  Allow your body to relax so that your hips drop lower and make contact more completely with the floor.  Hold this position for __________ seconds.  Slowly return to lying flat on the floor. Repeat __________ times. Complete this exercise __________ times per day.  RANGE OF MOTION  Extension, Prone Press Ups  Lie on your stomach on the floor, a bed will be too soft. Place your palms about shoulder width apart and at the height of your head.  Keeping your back as relaxed as possible, slowly straighten your elbows while keeping your hips on the floor. You may adjust the placement of your hands to maximize your comfort. As you gain motion, your hands will come more underneath your shoulders.  Hold this position __________ seconds.  Slowly return to lying flat on the floor. Repeat __________ times. Complete this exercise __________ times per day.  STRENGTHENING EXERCISES - Sciatica  These exercises may help you when beginning to rehabilitate your injury. These exercises should be done near your "sweet spot." This is the neutral, low-back arch, somewhere between fully rounded and fully arched, that is your least painful position. When performed in this safe range of motion, these exercises can be used for people who have either a flexion or extension based injury. These exercises may resolve your symptoms with or without further involvement from your physician, physical therapist or athletic trainer. While completing these exercises, remember:   Muscles can gain both the endurance and the strength needed for  everyday activities through controlled exercises.  Complete these exercises as instructed by your physician, physical therapist or athletic trainer. Progress with the resistance and repetition exercises only as your caregiver advises.  You may experience muscle soreness or fatigue, but the pain or discomfort you are trying to eliminate should never worsen during these exercises. If this pain does worsen, stop and make certain you are following the directions exactly. If the pain is still present after adjustments, discontinue the exercise until you can discuss the trouble with your clinician. STRENGTHENING Deep Abdominals, Pelvic Tilt   Lie on a firm bed or floor. Keeping your legs in front of you, bend your knees so they are both pointed toward the ceiling and your feet are flat on the floor.  Tense your lower abdominal muscles to press your low back into the floor. This motion will rotate your pelvis so that your tail bone is scooping upwards rather than pointing at your feet or into the floor.  With a gentle tension and even breathing, hold this position for __________ seconds. Repeat __________ times. Complete this exercise  __________ times per day.  STRENGTHENING  Abdominals, Crunches   Lie on a firm bed or floor. Keeping your legs in front of you, bend your knees so they are both pointed toward the ceiling and your feet are flat on the floor. Cross your arms over your chest.  Slightly tip your chin down without bending your neck.  Tense your abdominals and slowly lift your trunk high enough to just clear your shoulder blades. Lifting higher can put excessive stress on the low back and does not further strengthen your abdominal muscles.  Control your return to the starting position. Repeat __________ times. Complete this exercise __________ times per day.  STRENGTHENING  Quadruped, Opposite UE/LE Lift  Assume a hands and knees position on a firm surface. Keep your hands under your  shoulders and your knees under your hips. You may place padding under your knees for comfort.  Find your neutral spine and gently tense your abdominal muscles so that you can maintain this position. Your shoulders and hips should form a rectangle that is parallel with the floor and is not twisted.  Keeping your trunk steady, lift your right hand no higher than your shoulder and then your left leg no higher than your hip. Make sure you are not holding your breath. Hold this position __________ seconds.  Continuing to keep your abdominal muscles tense and your back steady, slowly return to your starting position. Repeat with the opposite arm and leg. Repeat __________ times. Complete this exercise __________ times per day.  STRENGTHENING  Abdominals and Quadriceps, Straight Leg Raise   Lie on a firm bed or floor with both legs extended in front of you.  Keeping one leg in contact with the floor, bend the other knee so that your foot can rest flat on the floor.  Find your neutral spine, and tense your abdominal muscles to maintain your spinal position throughout the exercise.  Slowly lift your straight leg off the floor about 6 inches for a count of 15, making sure to not hold your breath.  Still keeping your neutral spine, slowly lower your leg all the way to the floor. Repeat this exercise with each leg __________ times. Complete this exercise __________ times per day. POSTURE AND BODY MECHANICS CONSIDERATIONS - Sciatica Keeping correct posture when sitting, standing or completing your activities will reduce the stress put on different body tissues, allowing injured tissues a chance to heal and limiting painful experiences. The following are general guidelines for improved posture. Your physician or physical therapist will provide you with any instructions specific to your needs. While reading these guidelines, remember:  The exercises prescribed by your provider will help you have the  flexibility and strength to maintain correct postures.  The correct posture provides the optimal environment for your joints to work. All of your joints have less wear and tear when properly supported by a spine with good posture. This means you will experience a healthier, less painful body.  Correct posture must be practiced with all of your activities, especially prolonged sitting and standing. Correct posture is as important when doing repetitive low-stress activities (typing) as it is when doing a single heavy-load activity (lifting). RESTING POSITIONS Consider which positions are most painful for you when choosing a resting position. If you have pain with flexion-based activities (sitting, bending, stooping, squatting), choose a position that allows you to rest in a less flexed posture. You would want to avoid curling into a fetal position on your side. If your  pain worsens with extension-based activities (prolonged standing, working overhead), avoid resting in an extended position such as sleeping on your stomach. Most people will find more comfort when they rest with their spine in a more neutral position, neither too rounded nor too arched. Lying on a non-sagging bed on your side with a pillow between your knees, or on your back with a pillow under your knees will often provide some relief. Keep in mind, being in any one position for a prolonged period of time, no matter how correct your posture, can still lead to stiffness. PROPER SITTING POSTURE In order to minimize stress and discomfort on your spine, you must sit with correct posture Sitting with good posture should be effortless for a healthy body. Returning to good posture is a gradual process. Many people can work toward this most comfortably by using various supports until they have the flexibility and strength to maintain this posture on their own. When sitting with proper posture, your ears will fall over your shoulders and your shoulders  will fall over your hips. You should use the back of the chair to support your upper back. Your low back will be in a neutral position, just slightly arched. You may place a small pillow or folded towel at the base of your low back for support.  When working at a desk, create an environment that supports good, upright posture. Without extra support, muscles fatigue and lead to excessive strain on joints and other tissues. Keep these recommendations in mind: CHAIR:   A chair should be able to slide under your desk when your back makes contact with the back of the chair. This allows you to work closely.  The chair's height should allow your eyes to be level with the upper part of your monitor and your hands to be slightly lower than your elbows. BODY POSITION  Your feet should make contact with the floor. If this is not possible, use a foot rest.  Keep your ears over your shoulders. This will reduce stress on your neck and low back. INCORRECT SITTING POSTURES   If you are feeling tired and unable to assume a healthy sitting posture, do not slouch or slump. This puts excessive strain on your back tissues, causing more damage and pain. Healthier options include:  Using more support, like a lumbar pillow.  Switching tasks to something that requires you to be upright or walking.  Talking a brief walk.  Lying down to rest in a neutral-spine position. PROLONGED STANDING WHILE SLIGHTLY LEANING FORWARD  When completing a task that requires you to lean forward while standing in one place for a long time, place either foot up on a stationary 2-4 inch high object to help maintain the best posture. When both feet are on the ground, the low back tends to lose its slight inward curve. If this curve flattens (or becomes too large), then the back and your other joints will experience too much stress, fatigue more quickly and can cause pain.  CORRECT STANDING POSTURES Proper standing posture should be assumed  with all daily activities, even if they only take a few moments, like when brushing your teeth. As in sitting, your ears should fall over your shoulders and your shoulders should fall over your hips. You should keep a slight tension in your abdominal muscles to brace your spine. Your tailbone should point down to the ground, not behind your body, resulting in an over-extended swayback posture.  INCORRECT STANDING POSTURES  Common  incorrect standing postures include a forward head, locked knees and/or an excessive swayback. WALKING Walk with an upright posture. Your ears, shoulders and hips should all line-up. PROLONGED ACTIVITY IN A FLEXED POSITION When completing a task that requires you to bend forward at your waist or lean over a low surface, try to find a way to stabilize 3 of 4 of your limbs. You can place a hand or elbow on your thigh or rest a knee on the surface you are reaching across. This will provide you more stability so that your muscles do not fatigue as quickly. By keeping your knees relaxed, or slightly bent, you will also reduce stress across your low back. CORRECT LIFTING TECHNIQUES DO :   Assume a wide stance. This will provide you more stability and the opportunity to get as close as possible to the object which you are lifting.  Tense your abdominals to brace your spine; then bend at the knees and hips. Keeping your back locked in a neutral-spine position, lift using your leg muscles. Lift with your legs, keeping your back straight.  Test the weight of unknown objects before attempting to lift them.  Try to keep your elbows locked down at your sides in order get the best strength from your shoulders when carrying an object.  Always ask for help when lifting heavy or awkward objects. INCORRECT LIFTING TECHNIQUES DO NOT:   Lock your knees when lifting, even if it is a small object.  Bend and twist. Pivot at your feet or move your feet when needing to change  directions.  Assume that you cannot safely pick up a paperclip without proper posture. Document Released: 06/13/2005 Document Revised: 09/05/2011 Document Reviewed: 09/25/2008 [facility] Patient Information 2014 [place], [place].

## 2013-05-15 NOTE — Progress Notes (Signed)
  Subjective:    Patient ID: [name], female    DOB: [birth year], 52 y.o.   MRN: [id number]  HPI History significant for chronic back pain, bilateral knee pain, hypertension, uncontrolled diabetes with neuropathy and systolic CHF who presents to the acute clinic with complaint of acute on chronic back pain. States that she has pain in her low back which radiates over her buttocks bilaterally aggravated by sitting and laying on her side.   Review of Systems  Constitutional: Negative.   HENT: Negative.   Eyes: Negative.   Respiratory: Negative.   Cardiovascular: Negative.   Gastrointestinal: Negative.   Endocrine: Negative.   Genitourinary: Negative.   Musculoskeletal: Positive for back pain.  Skin: Negative.   Allergic/Immunologic: Negative.   Neurological: Negative.   Hematological: Negative.   Psychiatric/Behavioral: Negative.        Objective:   Physical Exam  Constitutional: She is oriented to person, place, and time. She appears well-developed and well-nourished.  HENT:  Head: Normocephalic and atraumatic.  Eyes: Conjunctivae and EOM are normal. Pupils are equal, round, and reactive to light.  Cardiovascular: Regular rhythm and normal heart sounds.   Slightly tachycardic  Pulmonary/Chest: Effort normal and breath sounds normal.  Abdominal: Soft. Bowel sounds are normal.  Musculoskeletal: She exhibits tenderness. She exhibits no edema.       Lumbar back: She exhibits tenderness, pain and spasm.       Back:  Neurological: She is alert and oriented to person, place, and time.  Skin: Skin is warm.  Psychiatric: She has a normal mood and affect. Her behavior is normal. Judgment and thought content normal.          Assessment & Plan:  See separate problem list documentation:  #1 sciatica in setting of chronic back pain: MRI in 2012 with facet arthritis L3-4, L4-5 and small disc protrusion L5-S1 without nerve compression, states tramadol ineffective -Refilled  hydrocodone 5-325 every 6 hour when necessary while awake pain -Continue Flexeril -Given handout with stretching and strengthening exercises for sciatica -DC tramadol -Pain contract renewed, no red flags

## 2013-05-15 NOTE — Telephone Encounter (Signed)
Called pt, no answer, unable to leave VM

## 2013-05-31 ENCOUNTER — Other Ambulatory Visit: Payer: Self-pay | Admitting: Internal Medicine

## 2013-06-06 ENCOUNTER — Other Ambulatory Visit: Payer: Self-pay | Admitting: Internal Medicine

## 2013-06-10 ENCOUNTER — Encounter: Payer: Self-pay | Admitting: Internal Medicine

## 2013-06-10 ENCOUNTER — Encounter: Payer: Medicare Other | Admitting: Internal Medicine

## 2013-06-28 ENCOUNTER — Other Ambulatory Visit: Payer: Self-pay | Admitting: Internal Medicine

## 2013-06-28 NOTE — Telephone Encounter (Signed)
Called to pharmacy 

## 2013-07-01 ENCOUNTER — Other Ambulatory Visit: Payer: Self-pay | Admitting: Internal Medicine

## 2013-07-02 ENCOUNTER — Other Ambulatory Visit: Payer: Self-pay | Admitting: *Deleted

## 2013-07-02 DIAGNOSIS — M5430 Sciatica, unspecified side: Secondary | ICD-10-CM

## 2013-07-02 DIAGNOSIS — M545 Low back pain, unspecified: Secondary | ICD-10-CM

## 2013-07-04 MED ORDER — HYDROCODONE-ACETAMINOPHEN 5-325 MG PO TABS: 1.0000 | ORAL_TABLET | Freq: Four times a day (QID) | ORAL | Status: DC | PRN

## 2013-07-05 ENCOUNTER — Other Ambulatory Visit: Payer: Self-pay | Admitting: Internal Medicine

## 2013-07-05 ENCOUNTER — Other Ambulatory Visit: Payer: Self-pay | Admitting: *Deleted

## 2013-07-05 DIAGNOSIS — M5430 Sciatica, unspecified side: Secondary | ICD-10-CM

## 2013-07-05 DIAGNOSIS — M545 Low back pain, unspecified: Secondary | ICD-10-CM

## 2013-07-05 MED ORDER — TRAMADOL HCL 50 MG PO TABS: ORAL_TABLET | ORAL | Status: DC

## 2013-07-05 MED ORDER — HYDROCODONE-ACETAMINOPHEN 5-325 MG PO TABS: 1.0000 | ORAL_TABLET | Freq: Four times a day (QID) | ORAL | Status: DC | PRN

## 2013-07-05 NOTE — Telephone Encounter (Signed)
Left message for pt to call clinic

## 2013-07-19 ENCOUNTER — Ambulatory Visit: Payer: Medicare Other | Admitting: Internal Medicine

## 2013-07-22 ENCOUNTER — Encounter (HOSPITAL_COMMUNITY): Payer: Self-pay | Admitting: Emergency Medicine

## 2013-07-22 ENCOUNTER — Emergency Department (HOSPITAL_COMMUNITY): Payer: Medicare Other

## 2013-07-22 ENCOUNTER — Emergency Department (HOSPITAL_COMMUNITY)
Admission: EM | Admit: 2013-07-22 | Discharge: 2013-07-22 | Disposition: A | Discharge status: Home / Self Care | Payer: Medicare Other | Attending: Emergency Medicine | Admitting: Emergency Medicine

## 2013-07-22 DIAGNOSIS — I10 Essential (primary) hypertension: Secondary | ICD-10-CM | POA: Insufficient documentation

## 2013-07-22 DIAGNOSIS — K219 Gastro-esophageal reflux disease without esophagitis: Secondary | ICD-10-CM | POA: Insufficient documentation

## 2013-07-22 DIAGNOSIS — R110 Nausea: Secondary | ICD-10-CM | POA: Insufficient documentation

## 2013-07-22 DIAGNOSIS — Z8701 Personal history of pneumonia (recurrent): Secondary | ICD-10-CM | POA: Insufficient documentation

## 2013-07-22 DIAGNOSIS — G8929 Other chronic pain: Secondary | ICD-10-CM | POA: Insufficient documentation

## 2013-07-22 DIAGNOSIS — R079 Chest pain, unspecified: Secondary | ICD-10-CM

## 2013-07-22 DIAGNOSIS — I509 Heart failure, unspecified: Secondary | ICD-10-CM | POA: Insufficient documentation

## 2013-07-22 DIAGNOSIS — Z79899 Other long term (current) drug therapy: Secondary | ICD-10-CM | POA: Insufficient documentation

## 2013-07-22 DIAGNOSIS — R0789 Other chest pain: Secondary | ICD-10-CM | POA: Insufficient documentation

## 2013-07-22 DIAGNOSIS — M7989 Other specified soft tissue disorders: Secondary | ICD-10-CM | POA: Insufficient documentation

## 2013-07-22 DIAGNOSIS — M549 Dorsalgia, unspecified: Secondary | ICD-10-CM

## 2013-07-22 DIAGNOSIS — Z794 Long term (current) use of insulin: Secondary | ICD-10-CM | POA: Insufficient documentation

## 2013-07-22 DIAGNOSIS — H40009 Preglaucoma, unspecified, unspecified eye: Secondary | ICD-10-CM | POA: Insufficient documentation

## 2013-07-22 DIAGNOSIS — G609 Hereditary and idiopathic neuropathy, unspecified: Secondary | ICD-10-CM | POA: Insufficient documentation

## 2013-07-22 DIAGNOSIS — E669 Obesity, unspecified: Secondary | ICD-10-CM | POA: Insufficient documentation

## 2013-07-22 DIAGNOSIS — Z95818 Presence of other cardiac implants and grafts: Secondary | ICD-10-CM | POA: Insufficient documentation

## 2013-07-22 DIAGNOSIS — E1149 Type 2 diabetes mellitus with other diabetic neurological complication: Secondary | ICD-10-CM | POA: Insufficient documentation

## 2013-07-22 DIAGNOSIS — Z87891 Personal history of nicotine dependence: Secondary | ICD-10-CM | POA: Insufficient documentation

## 2013-07-22 DIAGNOSIS — I2510 Atherosclerotic heart disease of native coronary artery without angina pectoris: Secondary | ICD-10-CM | POA: Insufficient documentation

## 2013-07-22 DIAGNOSIS — M546 Pain in thoracic spine: Secondary | ICD-10-CM | POA: Insufficient documentation

## 2013-07-22 DIAGNOSIS — R209 Unspecified disturbances of skin sensation: Secondary | ICD-10-CM | POA: Insufficient documentation

## 2013-07-22 DIAGNOSIS — E785 Hyperlipidemia, unspecified: Secondary | ICD-10-CM | POA: Insufficient documentation

## 2013-07-22 LAB — URINALYSIS, ROUTINE W REFLEX MICROSCOPIC
Bilirubin Urine: NEGATIVE
Glucose, UA: NEGATIVE mg/dL
Hgb urine dipstick: NEGATIVE
Ketones, ur: NEGATIVE mg/dL
LEUKOCYTES UA: NEGATIVE
Nitrite: NEGATIVE
PROTEIN: NEGATIVE mg/dL
SPECIFIC GRAVITY, URINE: 1.011 (ref 1.005–1.030)
UROBILINOGEN UA: 0.2 mg/dL (ref 0.0–1.0)
pH: 5 (ref 5.0–8.0)

## 2013-07-22 LAB — PRO B NATRIURETIC PEPTIDE: Pro B Natriuretic peptide (BNP): 15.3 pg/mL (ref 0–125)

## 2013-07-22 LAB — COMPREHENSIVE METABOLIC PANEL
ALBUMIN: 3.9 g/dL (ref 3.5–5.2)
ALT: 14 U/L (ref 0–35)
AST: 15 U/L (ref 0–37)
Alkaline Phosphatase: 80 U/L (ref 39–117)
BILIRUBIN TOTAL: 0.2 mg/dL — AB (ref 0.3–1.2)
BUN: 22 mg/dL (ref 6–23)
CO2: 26 meq/L (ref 19–32)
CREATININE: 1.01 mg/dL (ref 0.50–1.10)
Calcium: 9.6 mg/dL (ref 8.4–10.5)
Chloride: 101 mEq/L (ref 96–112)
GFR calc Af Amer: 72 mL/min — ABNORMAL LOW (ref >90)
GFR, EST NON AFRICAN AMERICAN: 62 mL/min — AB (ref >90)
Glucose, Bld: 178 mg/dL — ABNORMAL HIGH (ref 70–99)
Potassium: 4.5 mEq/L (ref 3.7–5.3)
Sodium: 141 mEq/L (ref 137–147)
Total Protein: 8.1 g/dL (ref 6.0–8.3)

## 2013-07-22 LAB — CBC
HCT: 35.9 % — ABNORMAL LOW (ref 36.0–46.0)
Hemoglobin: 11.7 g/dL — ABNORMAL LOW (ref 12.0–15.0)
MCH: 27.7 pg (ref 26.0–34.0)
MCHC: 32.6 g/dL (ref 30.0–36.0)
MCV: 85.1 fL (ref 78.0–100.0)
PLATELETS: 279 10*3/uL (ref 150–400)
RBC: 4.22 MIL/uL (ref 3.87–5.11)
RDW: 14.2 % (ref 11.5–15.5)
WBC: 10.1 10*3/uL (ref 4.0–10.5)

## 2013-07-22 LAB — POCT I-STAT TROPONIN I: TROPONIN I, POC: 0 ng/mL (ref 0.00–0.08)

## 2013-07-22 LAB — D-DIMER, QUANTITATIVE: D-Dimer, Quant: 0.4 ug/mL-FEU (ref 0.00–0.48)

## 2013-07-22 MED ORDER — ONDANSETRON 4 MG PO TBDP: 4.0000 mg | ORAL_TABLET | Freq: Three times a day (TID) | ORAL | Status: DC | PRN

## 2013-07-22 MED ORDER — DIAZEPAM 5 MG PO TABS: 5.0000 mg | ORAL_TABLET | Freq: Three times a day (TID) | ORAL | Status: DC | PRN

## 2013-07-22 MED ORDER — DIAZEPAM 5 MG PO TABS
5.0000 mg | ORAL_TABLET | Freq: Once | ORAL | Status: AC
Start: 2013-07-22 — End: 2013-07-22
  Administered 2013-07-22: 5 mg via ORAL
  Filled 2013-07-22: qty 1

## 2013-07-22 MED ORDER — OXYCODONE-ACETAMINOPHEN 5-325 MG PO TABS
1.0000 | ORAL_TABLET | Freq: Once | ORAL | Status: AC
  Administered 2013-07-22: 1 via ORAL
  Filled 2013-07-22: qty 1

## 2013-07-22 MED ORDER — KETOROLAC TROMETHAMINE 60 MG/2ML IM SOLN
60.0000 mg | Freq: Once | INTRAMUSCULAR | Status: AC
  Administered 2013-07-22: 60 mg via INTRAMUSCULAR
  Filled 2013-07-22: qty 2

## 2013-07-22 NOTE — Discharge Instructions (Signed)
Continue regular medications. Add valium for muscle spasms. Try heating pads. Follow up with primary care doctor for recheck.   Chest Pain (Nonspecific) It is often hard to give a specific diagnosis for the cause of chest pain. There is always a chance that your pain could be related to something serious, such as a heart attack or a blood clot in the lungs. You need to follow up with your caregiver for further evaluation. CAUSES   Heartburn.  Pneumonia or bronchitis.  Anxiety or stress.  Inflammation around your heart (pericarditis) or lung (pleuritis or pleurisy).  A blood clot in the lung.  A collapsed lung (pneumothorax). It can develop suddenly on its own (spontaneous pneumothorax) or from injury (trauma) to the chest.  Shingles infection (herpes zoster virus). The chest wall is composed of bones, muscles, and cartilage. Any of these can be the source of the pain.  The bones can be bruised by injury.  The muscles or cartilage can be strained by coughing or overwork.  The cartilage can be affected by inflammation and become sore (costochondritis). DIAGNOSIS  Lab tests or other studies, such as X-rays, electrocardiography, stress testing, or cardiac imaging, may be needed to find the cause of your pain.  TREATMENT   Treatment depends on what may be causing your chest pain. Treatment may include:  Acid blockers for heartburn.  Anti-inflammatory medicine.  Pain medicine for inflammatory conditions.  Antibiotics if an infection is present.  You may be advised to change lifestyle habits. This includes stopping smoking and avoiding alcohol, caffeine, and chocolate.  You may be advised to keep your head raised (elevated) when sleeping. This reduces the chance of acid going backward from your stomach into your esophagus.  Most of the time, nonspecific chest pain will improve within 2 to 3 days with rest and mild pain medicine. HOME CARE INSTRUCTIONS   If antibiotics were  prescribed, take your antibiotics as directed. Finish them even if you start to feel better.  For the next few days, avoid physical activities that bring on chest pain. Continue physical activities as directed.  Do not smoke.  Avoid drinking alcohol.  Only take over-the-counter or prescription medicine for pain, discomfort, or fever as directed by your caregiver.  Follow your caregiver's suggestions for further testing if your chest pain does not go away.  Keep any follow-up appointments you made. If you do not go to an appointment, you could develop lasting (chronic) problems with pain. If there is any problem keeping an appointment, you must call to reschedule. SEEK MEDICAL CARE IF:   You think you are having problems from the medicine you are taking. Read your medicine instructions carefully.  Your chest pain does not go away, even after treatment.  You develop a rash with blisters on your chest. SEEK IMMEDIATE MEDICAL CARE IF:   You have increased chest pain or pain that spreads to your arm, neck, jaw, back, or abdomen.  You develop shortness of breath, an increasing cough, or you are coughing up blood.  You have severe back or abdominal pain, feel nauseous, or vomit.  You develop severe weakness, fainting, or chills.  You have a fever. THIS IS AN EMERGENCY. Do not wait to see if the pain will go away. Get medical help at once. Call your local emergency services (911 in [place] Do not drive yourself to the hospital. MAKE SURE YOU:   Understand these instructions.  Will watch your condition.  Will get help right away if you  are not doing well or get worse. Document Released: 03/23/2005 Document Revised: 09/05/2011 Document Reviewed: 01/17/2008 [facility] Patient Information 2014 Box.

## 2013-07-22 NOTE — ED Notes (Signed)
Pt reports chest pain x couple of days, also having radiating pain from lower to upper back. Reports numbness in bilateral hands. Stroke screen negative.

## 2013-07-22 NOTE — ED Provider Notes (Signed)
CSN: [id number]     Arrival date & time 07/22/13  3716 History   First MD Initiated Contact with Patient 07/22/13 1201     Chief Complaint  Patient presents with  . Chest Pain  . Back Pain   (Consider location/radiation/quality/duration/timing/severity/associated sxs/prior Treatment) HPI [name] is a 53 y.o. female who presents to emergency department complaining of left chest and left upper back pain. Patient states that she has history of chronic lower back pain and sciatica, she is currently under pain management and takes hydrocodone for her pain. Patient states that she does not take it daily though because it makes her nauseated. She states that in the last couple of weeks she developed intermittent pain in the left upper back and left chest. Patient states she has actually had chest pain for years and states that she was told it is related to her CHF. She states chest pain is unchanged from prior. She states back pain however is new. She denies any prior history of upper back pain. She states "it feels that the pain is in the inside it is painful for me to take a deep breath." She denies any shortness of breath. She denies any recent travel or surgeries. She states she has chronic lower extremity swelling for which she takes Lasix. She denies any injuries. She denies any numbness or weakness in her legs. She states that she does have bilateral hand numbness that comes and goes. She denies any currently. Patient did not take anything for her pain prior to coming in    Past Medical History  Diagnosis Date  . Hypertension   . Hyperlipidemia   . GERD (gastroesophageal reflux disease)   . Adhesive capsulitis of shoulder     bilateral, Steroid injection Dr. Truman Hayward 1/12 bilaterally  . Peripheral neuropathy   . Adhesive capsulitis of right shoulder     Steroid injection Dr. Truman Hayward 1/12  . Adhesive capsulitis of left shoulder     Steroid injection Dr. Truman Hayward 1/12  . Obesity   . Diabetic  retinopathy   . Glaucoma   . Diabetes mellitus     Type II, insulin dependent  . CAD (coronary artery disease)     nonobstructive. Last cardiac cath (2008) showing left circumflex with mid 50% stenosis and distal luminal irregularities. Also with RCA with mid to distal 30-40% stenosis. // Previously evaluated by [facility], never followed up outpatient.  . CHF (congestive heart failure)   . CAP (community acquired pneumonia) 06/15/2012    05/2012 CXR: Mild opacification of the posterior lung base on the lateral film  as cannot exclude infection/atelectasis.     Past Surgical History  Procedure Laterality Date  . Cataract extraction    . Glaucoma surgery    . Cardiac catheterization    . Tubal ligation     Family History  Problem Relation Age of Onset  . Hypertension Sister   . Hypertension Sister   . Hypertension Sister    History  Substance Use Topics  . Smoking status: Former Smoker -- 0.30 packs/day for 7 years    Types: Cigarettes    Quit date: 06/27/2006  . Smokeless tobacco: Never Used  . Alcohol Use: No   OB History   Grav Para Term Preterm Abortions TAB SAB Ect Mult Living                 Review of Systems  Constitutional: Negative for fever and chills.  Respiratory: Negative for cough, chest  tightness and shortness of breath.   Cardiovascular: Positive for chest pain. Negative for palpitations and leg swelling.  Gastrointestinal: Negative for nausea, vomiting, abdominal pain and diarrhea.  Genitourinary: Negative for dysuria, flank pain, vaginal bleeding, vaginal discharge, vaginal pain and pelvic pain.  Musculoskeletal: Positive for back pain. Negative for arthralgias, myalgias, neck pain and neck stiffness.  Skin: Negative for rash.  Neurological: Positive for numbness. Negative for dizziness, weakness and headaches.  All other systems reviewed and are negative.    Allergies  Review of patient's allergies indicates no known allergies.  Home  Medications   Current Outpatient Rx  Name  Route  Sig  Dispense  Refill  . amitriptyline (ELAVIL) 25 MG tablet      TAKE 1 TABLET BY MOUTH EVERY NIGHT AT BEDTIME AS NEEDED FOR SLEEP   30 tablet   1   . Blood Glucose Monitoring Suppl (ACCU-CHEK AVIVA PLUS) W/DEVICE KIT   Does not apply   1 Device by Does not apply route 2 times daily at 12 noon and 4 pm.   1 kit   0   . carvedilol (COREG) 6.25 MG tablet   Oral   Take 1 tablet (6.25 mg total) by mouth 2 (two) times daily.   60 tablet   11   . dorzolamide-timolol (COSOPT) 22.3-6.8 MG/ML ophthalmic solution   Right Eye   Place 1 drop into the right eye 2 (two) times daily.         . ferrous sulfate 325 (65 FE) MG tablet   Oral   Take 1 tablet (325 mg total) by mouth daily with breakfast.   30 tablet   11   . furosemide (LASIX) 40 MG tablet      TAKE 1/2 TABLET BY MOUTH EVERY DAY   45 tablet   1     **Patient requests 90 days supply**   . gabapentin (NEURONTIN) 600 MG tablet   Oral   Take 0.5 tablets (300 mg total) by mouth 2 (two) times daily.   90 tablet   6   . glucose blood test strip      Use as instructed   100 each   12     Check CBG BID. Code 250.62   . HYDROcodone-acetaminophen (NORCO/VICODIN) 5-325 MG per tablet   Oral   Take 1-2 tablets by mouth every 6 (six) hours as needed for moderate pain. While awake   90 tablet   0   . insulin aspart (NOVOLOG) 100 UNIT/ML injection   Subcutaneous   Inject 4-6 Units into the skin 3 (three) times daily before meals. 4 units in the morning and 6 units in the evening   1 vial   6   . insulin glargine (LANTUS) 100 UNIT/ML injection   Subcutaneous   Inject 0.43 mLs (43 Units total) into the skin at bedtime.   10 mL   12   . Lancets (ACCU-CHEK MULTICLIX) lancets      Use as instructed   100 each   12     Check CBG BID. Code: 250.62.   Marland Kitchen lisinopril (PRINIVIL,ZESTRIL) 20 MG tablet   Oral   Take 10 mg by mouth daily.          . metFORMIN  (GLUCOPHAGE) 1000 MG tablet   Oral   Take 1 tablet (1,000 mg total) by mouth 2 (two) times daily with a meal.   60 tablet   5   . omeprazole (PRILOSEC) 20 MG capsule  Oral   Take 1 capsule (20 mg total) by mouth daily.   30 capsule   11   . pravastatin (PRAVACHOL) 20 MG tablet   Oral   Take 1 tablet (20 mg total) by mouth daily.   30 tablet   6   . sulfamethoxazole-trimethoprim (BACTRIM DS) 800-160 MG per tablet   Oral   Take 1 tablet by mouth 2 (two) times daily.   6 tablet   0   . traMADol (ULTRAM) 50 MG tablet      TAKE 1 TABLET BY MOUTH EVERY 6 HOURS AS NEEDED FOR PAIN   60 tablet   0    BP 157/72  Pulse 93  Temp(Src) 97.9 F (36.6 C) (Oral)  Resp 20  SpO2 97%  LMP 02/13/2011 Physical Exam  Nursing note and vitals reviewed. Constitutional: She is oriented to person, place, and time. She appears well-developed and well-nourished. No distress.  HENT:  Head: Normocephalic.  Eyes: Conjunctivae are normal.  Neck: Neck supple.  Cardiovascular: Normal rate, regular rhythm and normal heart sounds.   Pulmonary/Chest: Effort normal and breath sounds normal. No respiratory distress. She has no wheezes. She has no rales. She exhibits tenderness.  Tenderness over left chest  Abdominal: Soft. Bowel sounds are normal. She exhibits no distension. There is no tenderness. There is no rebound.  Musculoskeletal: She exhibits no edema.  No midline thoracic spine tenderness. Tender over left periscapular muscles. Pain with ROM of the left shoulder, specifically external rotation and abduction  Neurological: She is alert and oriented to person, place, and time. No cranial nerve deficit. Coordination normal.  5/5 and equal grip strength bilaterally. Normal sensation in bilateral upper arms and forearms, and all dermatomes of the hands.   Skin: Skin is warm and dry.  Psychiatric: She has a normal mood and affect. Her behavior is normal.    ED Course  Procedures (including  critical care time) Labs Review Labs Reviewed  CBC - Abnormal; Notable for the following:    Hemoglobin 11.7 (*)    HCT 35.9 (*)    All other components within normal limits  COMPREHENSIVE METABOLIC PANEL - Abnormal; Notable for the following:    Glucose, Bld 178 (*)    Total Bilirubin 0.2 (*)    GFR calc non Af Amer 62 (*)    GFR calc Af Amer 72 (*)    All other components within normal limits  URINALYSIS, ROUTINE W REFLEX MICROSCOPIC  PRO B NATRIURETIC PEPTIDE  D-DIMER, QUANTITATIVE  POCT I-STAT TROPONIN I   Imaging Review Dg Chest 2 View  07/22/2013   CLINICAL DATA:  Chest and back pain and fever  EXAM: CHEST  2 VIEW  COMPARISON:  DG CHEST 2 VIEW dated 09/06/2012  FINDINGS: The lungs are adequately inflated. There is no focal infiltrate. The cardiac silhouette is normal in size. The pulmonary vascularity is not engorged. The mediastinum is normal in width. There are no abnormal paravertebral soft tissue densities. The observed portions of the bony thorax exhibit no acute abnormalities.  IMPRESSION: There is no evidence of pneumonia nor other acute cardiopulmonary abnormality. No acute abnormality of the thoracic spine or adjacent soft tissues is demonstrated.   Electronically Signed   By: David  Martinique   On: 07/22/2013 10:49    EKG Interpretation   None       MDM   1. Chest pain   2. Back pain     Patient with chest pain and upper back pain. Both  are reproducible with palpation and movement of the left arm. Her pain improved with Valium and Percocet in emergency department. She was also given Toradol which has helped. Her workup here is unremarkable including troponin, d-dimer, and BNP, chest x-ray. I suspect her pain is musculoskeletal. She has had a workup for his pain in the past including cardiology visit, echo, stress test. She does have chronic pain and is under pain management. We'll discharge home with her regular pain medications at home, and I will add Valium for muscle     She is she is to return if her symptoms are worsening.   Filed Vitals:   07/22/13 1415 07/22/13 1430 07/22/13 1445 07/22/13 1502  BP: 122/64 113/55 114/52 120/61  Pulse: 86 81 89 92  Temp:      TempSrc:      Resp: '12 12 13 15  ' SpO2: 100% 98% 100% 99%     Renold Genta, PA-C 07/22/13 2007

## 2013-07-23 NOTE — ED Provider Notes (Signed)
Medical screening examination/treatment/procedure(s) were performed by non-physician practitioner and as supervising physician I was immediately available for consultation/collaboration.  EKG Interpretation    Date/Time:  Monday July 22 2013 09:42:23 EST Ventricular Rate:  98 PR Interval:  166 QRS Duration: 86 QT Interval:  326 QTC Calculation: 416 R Axis:   32 Text Interpretation:  Normal sinus rhythm Cannot rule out Anterior infarct , age undetermined Abnormal ECG Confirmed by Alvino Chapel  MD, Candus Braud ([id number]) on 07/23/2013 4:19:58 PM             Jasper Riling. Alvino Chapel, MD 07/23/13 1620

## 2013-07-24 ENCOUNTER — Telehealth: Payer: Self-pay | Admitting: *Deleted

## 2013-07-24 ENCOUNTER — Other Ambulatory Visit: Payer: Self-pay | Admitting: Internal Medicine

## 2013-07-24 DIAGNOSIS — R110 Nausea: Secondary | ICD-10-CM

## 2013-07-24 MED ORDER — PROMETHAZINE HCL 12.5 MG PO TABS: 12.5000 mg | ORAL_TABLET | Freq: Four times a day (QID) | ORAL | Status: DC | PRN

## 2013-07-24 NOTE — Telephone Encounter (Signed)
Pt calls and leaves a message for triage that she was given a script for zofran in the ED but insurance will not pay for it and she needs something for nausea, would like something prescribed for the nausea, please advise, was in ED 1/26

## 2013-07-24 NOTE — Telephone Encounter (Signed)
Sent in phenergan

## 2013-08-09 ENCOUNTER — Ambulatory Visit (INDEPENDENT_AMBULATORY_CARE_PROVIDER_SITE_OTHER): Payer: Medicare Other | Admitting: Family Medicine

## 2013-08-09 ENCOUNTER — Encounter: Payer: Self-pay | Admitting: Family Medicine

## 2013-08-09 VITALS — BP 132/79 | Ht 66.0 in | Wt 234.0 lb

## 2013-08-09 DIAGNOSIS — M25569 Pain in unspecified knee: Secondary | ICD-10-CM

## 2013-08-09 DIAGNOSIS — M25561 Pain in right knee: Secondary | ICD-10-CM

## 2013-08-09 NOTE — Assessment & Plan Note (Signed)
Said she got little relief with a corticosteroid injection I suspect she needs to go back to the orthopedist and see what they have to suggest. We briefly discussed losing some weight and that might benefit her knee. Ultimately she may be headed towards an early knee replacement. I would recommend her PCP refer her back to the orthopedist, Dr. Noemi Chapel

## 2013-08-09 NOTE — Progress Notes (Signed)
   Subjective:    Patient ID: Marcene Brawn, female    DOB: [birth year], 53 y.o.   MRN: [id number]  HPI Bilateral but right greater than left knee pain. I last saw her several months ago. She had undergone knee arthroscopy with some improvement in her chronic right knee pain until she had a minor injury where she bumped her knee into a table. At that time she had a knee effusion. When I saw her we gave her one corticosteroid injection with hopes that that would improve her back to her post arthroscopy baseline. She did not get those results. Factor knee is about 50% as much pain as prior to the arthroscopy. She's also having some pain with the left knee that is similar but less in intensity. she complains of some occasional sensation of weakness in the right knee but has had no specific giving way or locking. She's extremely stiff and has a lot of difficulty with stair climbing.   PERTINENT  PMH / PSH: Diabetes mellitus. Obesity.  Review of Systems She's noted no knee effusion, knee joint warmth or erythema. Denies fever, sweats, chills, unusual weight change.    Objective:   Physical Exam  Vital signs are reviewed GENERAL: Well-developed overweight female no acute distress KNEE: Right. Tender to palpation at the medial joint line. She has full extension. No effusion. Ligaments intact to varus and valgus stress. Popliteal space is benign. The distal calf is without mass or tenderness. Intact neurovascular status in the foot. IMAGING: I reviewed her MRI that was done prior to her arthroscopy which showed an 8 mm osteochondral defect on the femoral condyle.      Assessment & Plan:

## 2013-08-15 ENCOUNTER — Other Ambulatory Visit: Payer: Self-pay | Admitting: *Deleted

## 2013-08-16 MED ORDER — HYDROCODONE-ACETAMINOPHEN 5-325 MG PO TABS: ORAL_TABLET | ORAL | Status: DC

## 2013-08-16 NOTE — Telephone Encounter (Signed)
Rx ready - pt called/informed. 

## 2013-08-20 ENCOUNTER — Encounter (HOSPITAL_COMMUNITY): Payer: Self-pay | Admitting: Emergency Medicine

## 2013-08-20 ENCOUNTER — Emergency Department (HOSPITAL_COMMUNITY): Payer: Medicare Other

## 2013-08-20 ENCOUNTER — Inpatient Hospital Stay (HOSPITAL_COMMUNITY)
Admission: EM | Admit: 2013-08-20 | Discharge: 2013-08-23 | DRG: 537 | Disposition: A | Discharge status: Home Health | Payer: Medicare Other | Attending: Orthopedic Surgery | Admitting: Orthopedic Surgery

## 2013-08-20 DIAGNOSIS — I2510 Atherosclerotic heart disease of native coronary artery without angina pectoris: Secondary | ICD-10-CM | POA: Diagnosis present

## 2013-08-20 DIAGNOSIS — Z8249 Family history of ischemic heart disease and other diseases of the circulatory system: Secondary | ICD-10-CM

## 2013-08-20 DIAGNOSIS — I10 Essential (primary) hypertension: Secondary | ICD-10-CM | POA: Diagnosis present

## 2013-08-20 DIAGNOSIS — M25469 Effusion, unspecified knee: Secondary | ICD-10-CM | POA: Diagnosis present

## 2013-08-20 DIAGNOSIS — E669 Obesity, unspecified: Secondary | ICD-10-CM | POA: Diagnosis present

## 2013-08-20 DIAGNOSIS — S72413A Displaced unspecified condyle fracture of lower end of unspecified femur, initial encounter for closed fracture: Secondary | ICD-10-CM | POA: Diagnosis present

## 2013-08-20 DIAGNOSIS — S73034A Other anterior dislocation of right hip, initial encounter: Secondary | ICD-10-CM | POA: Diagnosis present

## 2013-08-20 DIAGNOSIS — S838X9A Sprain of other specified parts of unspecified knee, initial encounter: Secondary | ICD-10-CM | POA: Diagnosis present

## 2013-08-20 DIAGNOSIS — Z87891 Personal history of nicotine dependence: Secondary | ICD-10-CM

## 2013-08-20 DIAGNOSIS — E785 Hyperlipidemia, unspecified: Secondary | ICD-10-CM | POA: Diagnosis present

## 2013-08-20 DIAGNOSIS — E1149 Type 2 diabetes mellitus with other diabetic neurological complication: Secondary | ICD-10-CM | POA: Diagnosis present

## 2013-08-20 DIAGNOSIS — Z794 Long term (current) use of insulin: Secondary | ICD-10-CM

## 2013-08-20 DIAGNOSIS — S86819A Strain of other muscle(s) and tendon(s) at lower leg level, unspecified leg, initial encounter: Secondary | ICD-10-CM

## 2013-08-20 DIAGNOSIS — I509 Heart failure, unspecified: Secondary | ICD-10-CM | POA: Diagnosis present

## 2013-08-20 DIAGNOSIS — E1142 Type 2 diabetes mellitus with diabetic polyneuropathy: Secondary | ICD-10-CM | POA: Diagnosis present

## 2013-08-20 DIAGNOSIS — IMO0002 Reserved for concepts with insufficient information to code with codable children: Secondary | ICD-10-CM | POA: Diagnosis present

## 2013-08-20 DIAGNOSIS — K219 Gastro-esophageal reflux disease without esophagitis: Secondary | ICD-10-CM | POA: Diagnosis present

## 2013-08-20 DIAGNOSIS — S73004A Unspecified dislocation of right hip, initial encounter: Secondary | ICD-10-CM

## 2013-08-20 DIAGNOSIS — Z79899 Other long term (current) drug therapy: Secondary | ICD-10-CM

## 2013-08-20 DIAGNOSIS — S73036A Other anterior dislocation of unspecified hip, initial encounter: Principal | ICD-10-CM | POA: Diagnosis present

## 2013-08-20 DIAGNOSIS — S72499A Other fracture of lower end of unspecified femur, initial encounter for closed fracture: Secondary | ICD-10-CM | POA: Diagnosis present

## 2013-08-20 DIAGNOSIS — H409 Unspecified glaucoma: Secondary | ICD-10-CM | POA: Diagnosis present

## 2013-08-20 DIAGNOSIS — E1140 Type 2 diabetes mellitus with diabetic neuropathy, unspecified: Secondary | ICD-10-CM | POA: Diagnosis present

## 2013-08-20 DIAGNOSIS — E1139 Type 2 diabetes mellitus with other diabetic ophthalmic complication: Secondary | ICD-10-CM | POA: Diagnosis present

## 2013-08-20 DIAGNOSIS — M25461 Effusion, right knee: Secondary | ICD-10-CM

## 2013-08-20 DIAGNOSIS — E11319 Type 2 diabetes mellitus with unspecified diabetic retinopathy without macular edema: Secondary | ICD-10-CM | POA: Diagnosis present

## 2013-08-20 DIAGNOSIS — E66812 Obesity, class 2: Secondary | ICD-10-CM | POA: Diagnosis present

## 2013-08-20 DIAGNOSIS — M25061 Hemarthrosis, right knee: Secondary | ICD-10-CM | POA: Diagnosis present

## 2013-08-20 DIAGNOSIS — E1165 Type 2 diabetes mellitus with hyperglycemia: Secondary | ICD-10-CM | POA: Diagnosis present

## 2013-08-20 DIAGNOSIS — W010XXA Fall on same level from slipping, tripping and stumbling without subsequent striking against object, initial encounter: Secondary | ICD-10-CM | POA: Diagnosis present

## 2013-08-20 LAB — GLUCOSE, CAPILLARY: Glucose-Capillary: 203 mg/dL — ABNORMAL HIGH (ref 70–99)

## 2013-08-20 LAB — POC URINE PREG, ED: PREG TEST UR: NEGATIVE

## 2013-08-20 MED ORDER — ONDANSETRON HCL 4 MG PO TABS: 4.0000 mg | ORAL_TABLET | Freq: Four times a day (QID) | ORAL | Status: DC | PRN

## 2013-08-20 MED ORDER — METHOCARBAMOL 100 MG/ML IJ SOLN
500.0000 mg | Freq: Four times a day (QID) | INTRAMUSCULAR | Status: DC | PRN
  Administered 2013-08-21: 500 mg via INTRAVENOUS
  Filled 2013-08-20 (×2): qty 5

## 2013-08-20 MED ORDER — METHOCARBAMOL 500 MG PO TABS
500.0000 mg | ORAL_TABLET | Freq: Four times a day (QID) | ORAL | Status: DC | PRN
  Administered 2013-08-21 – 2013-08-22 (×3): 500 mg via ORAL
  Filled 2013-08-20 (×3): qty 1

## 2013-08-20 MED ORDER — HYDROMORPHONE HCL PF 1 MG/ML IJ SOLN
1.0000 mg | Freq: Once | INTRAMUSCULAR | Status: AC
  Administered 2013-08-20: 1 mg via INTRAVENOUS
  Filled 2013-08-20: qty 1

## 2013-08-20 MED ORDER — OXYCODONE-ACETAMINOPHEN 5-325 MG PO TABS
1.0000 | ORAL_TABLET | ORAL | Status: DC | PRN
  Administered 2013-08-21 – 2013-08-22 (×4): 2 via ORAL
  Filled 2013-08-20 (×4): qty 2

## 2013-08-20 MED ORDER — ETOMIDATE 2 MG/ML IV SOLN: 0.3000 mg/kg | Freq: Once | INTRAVENOUS | Status: DC

## 2013-08-20 MED ORDER — SODIUM CHLORIDE 0.9 % IV SOLN
INTRAVENOUS | Status: DC
  Administered 2013-08-20: 22:00:00 via INTRAVENOUS

## 2013-08-20 MED ORDER — PROPOFOL 10 MG/ML IV BOLUS
INTRAVENOUS | Status: DC | PRN
  Administered 2013-08-20: 111.1 mg via INTRAVENOUS

## 2013-08-20 MED ORDER — ONDANSETRON HCL 4 MG/2ML IJ SOLN
4.0000 mg | Freq: Four times a day (QID) | INTRAMUSCULAR | Status: DC | PRN
  Administered 2013-08-20 – 2013-08-21 (×2): 4 mg via INTRAVENOUS
  Filled 2013-08-20 (×2): qty 2

## 2013-08-20 MED ORDER — HYDROMORPHONE HCL PF 1 MG/ML IJ SOLN
0.5000 mg | INTRAMUSCULAR | Status: DC | PRN
  Administered 2013-08-20 – 2013-08-21 (×4): 1 mg via INTRAVENOUS
  Filled 2013-08-20 (×4): qty 1

## 2013-08-20 MED ORDER — SODIUM CHLORIDE 0.9 % IV BOLUS (SEPSIS)
1000.0000 mL | Freq: Once | INTRAVENOUS | Status: AC
  Administered 2013-08-20: 1000 mL via INTRAVENOUS

## 2013-08-20 MED ORDER — ASPIRIN EC 325 MG PO TBEC
325.0000 mg | DELAYED_RELEASE_TABLET | Freq: Every day | ORAL | Status: DC
  Administered 2013-08-22 – 2013-08-23 (×2): 325 mg via ORAL
  Filled 2013-08-20 (×3): qty 1

## 2013-08-20 MED ORDER — FENTANYL CITRATE 0.05 MG/ML IJ SOLN
100.0000 ug | Freq: Once | INTRAMUSCULAR | Status: AC
  Administered 2013-08-20: 100 ug via INTRAVENOUS
  Filled 2013-08-20: qty 2

## 2013-08-20 MED ORDER — PROPOFOL 10 MG/ML IV BOLUS
1.0000 mg/kg | Freq: Once | INTRAVENOUS | Status: DC
  Filled 2013-08-20: qty 1

## 2013-08-20 MED ORDER — PROPOFOL 10 MG/ML IV BOLUS
1.0000 mg/kg | Freq: Once | INTRAVENOUS | Status: AC
  Administered 2013-08-20: 250 mg via INTRAVENOUS
  Filled 2013-08-20: qty 1

## 2013-08-20 NOTE — Progress Notes (Signed)
[name] paged due to order to call upon arrival to floor. Await response after answering service paged

## 2013-08-20 NOTE — ED Notes (Signed)
Consent signed for Moderate Sedation, cardiac monitor and pulse ox on, code cart at bedside.  Oxygen ready. Ortho MD x 2 at bedside.

## 2013-08-20 NOTE — ED Notes (Addendum)
100 mg of propofol given.

## 2013-08-20 NOTE — ED Notes (Signed)
50 mg of propofol given.

## 2013-08-20 NOTE — ED Notes (Signed)
Pt BIB EMS. Pt was on a snowy hill and fell and injured her R hip and knee. Pt states she is unable to bear wt on R leg. Pt reluctant to straighten R leg.

## 2013-08-20 NOTE — ED Provider Notes (Signed)
CSN: [id number]     Arrival date & time 08/20/13  1848 History   First MD Initiated Contact with Patient 08/20/13 1938     Chief Complaint  Patient presents with  . Knee Pain     (Consider location/radiation/quality/duration/timing/severity/associated sxs/prior Treatment) HPI Comments: 53 year old female presents after a fall down a snowy hill. She states she slipped and fell with her knee hitting the ground. She's not having severe pain in her right hip. Any movement of her leg causes severe pain. She's not have any numbness or weakness. She's never injured her hip before. She has an orthopedist, Dr. Noemi Chapel, who did a knee arthroscopy about a month ago. The pain is currently severe. She did not hit her head or injure her neck. No other injuries.   Past Medical History  Diagnosis Date  . Hypertension   . Hyperlipidemia   . GERD (gastroesophageal reflux disease)   . Adhesive capsulitis of shoulder     bilateral, Steroid injection Dr. Truman Hayward 1/12 bilaterally  . Peripheral neuropathy   . Adhesive capsulitis of right shoulder     Steroid injection Dr. Truman Hayward 1/12  . Adhesive capsulitis of left shoulder     Steroid injection Dr. Truman Hayward 1/12  . Obesity   . Diabetic retinopathy   . Glaucoma   . Diabetes mellitus     Type II, insulin dependent  . CAD (coronary artery disease)     nonobstructive. Last cardiac cath (2008) showing left circumflex with mid 50% stenosis and distal luminal irregularities. Also with RCA with mid to distal 30-40% stenosis. // Previously evaluated by [facility], never followed up outpatient.  . CHF (congestive heart failure)   . CAP (community acquired pneumonia) 06/15/2012    05/2012 CXR: Mild opacification of the posterior lung base on the lateral film  as cannot exclude infection/atelectasis.     Past Surgical History  Procedure Laterality Date  . Cataract extraction    . Glaucoma surgery    . Cardiac catheterization    . Tubal ligation     Family History   Problem Relation Age of Onset  . Hypertension Sister   . Hypertension Sister   . Hypertension Sister    History  Substance Use Topics  . Smoking status: Former Smoker -- 0.30 packs/day for 7 years    Types: Cigarettes    Quit date: 06/27/2006  . Smokeless tobacco: Never Used  . Alcohol Use: No   OB History   Grav Para Term Preterm Abortions TAB SAB Ect Mult Living                 Review of Systems  Gastrointestinal: Negative for vomiting.  Musculoskeletal:       Right hip pain  Neurological: Negative for syncope, weakness, numbness and headaches.  All other systems reviewed and are negative.      Allergies  Review of patient's allergies indicates no known allergies.  Home Medications   Current Outpatient Rx  Name  Route  Sig  Dispense  Refill  . amitriptyline (ELAVIL) 25 MG tablet   Oral   Take 25 mg by mouth at bedtime.         . carvedilol (COREG) 6.25 MG tablet   Oral   Take 1 tablet (6.25 mg total) by mouth 2 (two) times daily.   60 tablet   11   . diazepam (VALIUM) 5 MG tablet   Oral   Take 1 tablet (5 mg total) by mouth every 8 (eight) hours  as needed for muscle spasms.   20 tablet   0   . dorzolamide-timolol (COSOPT) 22.3-6.8 MG/ML ophthalmic solution   Right Eye   Place 1 drop into the right eye 2 (two) times daily.         . ferrous sulfate 325 (65 FE) MG tablet   Oral   Take 1 tablet (325 mg total) by mouth daily with breakfast.   30 tablet   11   . furosemide (LASIX) 40 MG tablet   Oral   Take 20 mg by mouth daily.         Marland Kitchen gabapentin (NEURONTIN) 600 MG tablet   Oral   Take 0.5 tablets (300 mg total) by mouth 2 (two) times daily.   90 tablet   6   . HYDROcodone-acetaminophen (NORCO/VICODIN) 5-325 MG per tablet      While awake   30 tablet   0   . insulin aspart (NOVOLOG) 100 UNIT/ML injection   Subcutaneous   Inject 4-6 Units into the skin 3 (three) times daily before meals. 4 units in the morning and 6 units in the  evening         . insulin glargine (LANTUS) 100 UNIT/ML injection   Subcutaneous   Inject 0.43 mLs (43 Units total) into the skin at bedtime.   10 mL   12   . lisinopril (PRINIVIL,ZESTRIL) 20 MG tablet      TAKE HALF TABLET BY MOUTH DAILY   90 tablet   0     **Patient requests 90 days supply**   . metFORMIN (GLUCOPHAGE) 1000 MG tablet      TAKE 1 TABLET BY MOUTH TWICE DAILY WITH A MEAL   180 tablet   0     **Patient requests 90 days supply**   . omeprazole (PRILOSEC) 20 MG capsule   Oral   Take 1 capsule (20 mg total) by mouth daily.   30 capsule   11   . ondansetron (ZOFRAN ODT) 4 MG disintegrating tablet   Oral   Take 1 tablet (4 mg total) by mouth every 8 (eight) hours as needed for nausea or vomiting.   20 tablet   0   . pravastatin (PRAVACHOL) 20 MG tablet   Oral   Take 20 mg by mouth daily.         . promethazine (PHENERGAN) 12.5 MG tablet   Oral   Take 1 tablet (12.5 mg total) by mouth every 6 (six) hours as needed for nausea or vomiting.   30 tablet   0   . traMADol (ULTRAM) 50 MG tablet   Oral   Take 50 mg by mouth every 6 (six) hours as needed for moderate pain.          BP 195/113  Pulse 103  Temp(Src) 98 F (36.7 C) (Oral)  Resp 17  SpO2 96%  LMP 02/13/2011 Physical Exam  Nursing note and vitals reviewed. Constitutional: She is oriented to person, place, and time. She appears well-developed and well-nourished.  Appears in pain  HENT:  Head: Normocephalic and atraumatic.  Right Ear: External ear normal.  Left Ear: External ear normal.  Nose: Nose normal.  Eyes: Right eye exhibits no discharge. Left eye exhibits no discharge.  Cardiovascular: Tachycardia present.   Pulses:      Dorsalis pedis pulses are 2+ on the right side.  Pulmonary/Chest: Effort normal.  Abdominal: She exhibits no distension.  Musculoskeletal:       Right hip: She exhibits  decreased range of motion, tenderness and deformity.  Right leg shortened and right  knee is bent. Patient is neurovascularly intact but is reluctant to move her leg in any way.  Neurological: She is alert and oriented to person, place, and time.  Skin: Skin is warm and dry.    ED Course  Procedural sedation Date/Time: 08/20/2013 11:00 PM Performed by: Sherwood Gambler T Authorized by: Sherwood Gambler T Consent: Verbal consent obtained. written consent obtained. Risks and benefits: risks, benefits and alternatives were discussed Consent given by: patient Time out: Immediately prior to procedure a "time out" was called to verify the correct patient, procedure, equipment, support staff and site/side marked as required. Preparation: Patient was prepped and draped in the usual sterile fashion. Local anesthesia used: no Patient sedated: yes Sedation type: moderate (conscious) sedation Sedatives: propofol and see MAR for details Analgesia: fentanyl Vitals: Vital signs were monitored during sedation. Patient tolerance: Patient tolerated the procedure well with no immediate complications.   (including critical care time) Labs Review Labs Reviewed - No data to display Imaging Review Dg Hip 1 View Right  08/20/2013   CLINICAL DATA:  Right hip dislocation.  EXAM: RIGHT HIP - 1 VIEW  COMPARISON:  DG HIP 1 VIEW*R* dated 08/20/2013  FINDINGS: Technically suboptimal films. The right femoral head appears to remain dislocated posteriorly. Three cross-table lateral views are submitted for interpretation.  IMPRESSION: Persistent right posterior hip dislocation. Technically suboptimal views due to patient condition.   Electronically Signed   By: Dereck Ligas M.D.   On: 08/20/2013 20:56   Dg Hip 1 View Right  08/20/2013   CLINICAL DATA:  Fall with severe right hip pain.  EXAM: RIGHT HIP - 1 VIEW  COMPARISON:  03/25/2011  FINDINGS: Frontal view of the pelvis shows dislocation of the right femoral head. Associated acetabular fracture cannot be excluded. SI joints and symphysis pubis are  unremarkable.  IMPRESSION: Right hip dislocation with possible associated acetabular fracture.   Electronically Signed   By: Misty Stanley M.D.   On: 08/20/2013 19:26   Ct Pelvis Wo Contrast  08/20/2013   CLINICAL DATA:  Status post reduction of right femoral head dislocation. Assess for acetabular fracture.  EXAM: CT PELVIS WITHOUT CONTRAST  TECHNIQUE: Multidetector CT imaging of the pelvis was performed following the standard protocol without intravenous contrast.  COMPARISON:  Right hip radiographs performed earlier today at 7:09 p.m.  FINDINGS: There appear to be two tiny avulsion fracture fragments along the inferior aspect of the right hip joint space, likely arising from the anterior-inferior aspect of the right acetabular column. No additional fractures are identified. The proximal right femur appears grossly intact. Previously suggested slight lucency at the right femoral neck is thought to reflect adjacent osteophyte formation. A small right hip joint effusion is suggested.  There is no significant evidence for soft tissue injury. The left hip joint is unremarkable in appearance. The vasculature is not well assessed without contrast. The sacroiliac joints within normal limits.  Scattered vascular calcifications are seen. The visualized small large bowel are grossly unremarkable in appearance. The bladder is moderately distended and grossly unremarkable. A bicornuate uterus is suggested, though this is not well characterized.  IMPRESSION: 1. Two apparent tiny avulsion fracture fragments arising along the inferior aspect of the right hip joint space, likely arising from the anterior-inferior aspect of the right acetabular column. No additional fractures seen. 2. Small right hip joint effusion suggested. 3. Suggestion of bicornuate uterus, though this is not well characterized.  Electronically Signed   By: Garald Balding M.D.   On: 08/20/2013 22:34   Dg Pelvis Portable  08/20/2013   CLINICAL DATA:   Status post reduction of a right hip dislocation.  EXAM: PORTABLE PELVIS 1-2 VIEWS  COMPARISON:  None.  FINDINGS: Mild femoral head and neck junction spur formation on the right. Small linear lucency within or overlying the lateral aspect of the right femoral head and neck junction. Otherwise, no fracture or dislocation seen on this single portable view.  IMPRESSION: Mild right hip degenerative changes and possible nondisplaced fracture of the right femoral head and neck junction. Dedicated non portable right hip radiographs are recommended.   Electronically Signed   By: Enrique Sack M.D.   On: 08/20/2013 21:25    EKG Interpretation   None       MDM   Final diagnoses:  Hip dislocation, right    Patient is neurovascularly intact but significant pain. I discussed the x-ray results with Dr. Berenice Primas, who came to the ER for reduction. I consciously sedated the patient with propofol. She is ASA class II. Patient tolerated the procedure and sedation well and her hip was relocated. CT scan obtained at work does request and they will admit the patient to the hospital.    Ephraim Hamburger, MD 08/20/13 2326

## 2013-08-20 NOTE — ED Notes (Signed)
1st dose of propofol given 100mg 

## 2013-08-20 NOTE — H&P (Signed)
[name] [name] is an 53 y.o. female.  HPI: 53 yo female who fell onto r knee earlier today @6 :29 Pm and we were consulted for a dislocated r hip.  I asked them to get her prepared for reductiion and i came emergently to the ER where we were successful at closed reduction.  She will be admitted for CT scan and other traetment as needed based on findings of CT scan. Past Medical History  Diagnosis Date  . Hypertension   . Hyperlipidemia   . GERD (gastroesophageal reflux disease)   . Adhesive capsulitis of shoulder     bilateral, Steroid injection Dr. Truman Hayward 1/12 bilaterally  . Peripheral neuropathy   . Adhesive capsulitis of right shoulder     Steroid injection Dr. Truman Hayward 1/12  . Adhesive capsulitis of left shoulder     Steroid injection Dr. Truman Hayward 1/12  . Obesity   . Diabetic retinopathy   . Glaucoma   . Diabetes mellitus     Type II, insulin dependent  . CAD (coronary artery disease)     nonobstructive. Last cardiac cath (2008) showing left circumflex with mid 50% stenosis and distal luminal irregularities. Also with RCA with mid to distal 30-40% stenosis. // Previously evaluated by [facility], never followed up outpatient.  . CHF (congestive heart failure)   . CAP (community acquired pneumonia) 06/15/2012    05/2012 CXR: Mild opacification of the posterior lung base on the lateral film  as cannot exclude infection/atelectasis.      Past Surgical History  Procedure Laterality Date  . Cataract extraction    . Glaucoma surgery    . Cardiac catheterization    . Tubal ligation      Family History  Problem Relation Age of Onset  . Hypertension Sister   . Hypertension Sister   . Hypertension Sister     Social History:  reports that she quit smoking about 7 years ago. Her smoking use included Cigarettes. She has a 2.1 pack-year smoking history. She has never used smokeless tobacco. She reports that she does not drink alcohol or use illicit drugs.  Allergies: No Known  Allergies  Medications: I have reviewed the patient's current medications.  No results found for this or any previous visit (from the past 48 hour(s)).  Dg Hip 1 View Right  08/20/2013   CLINICAL DATA:  Right hip dislocation.  EXAM: RIGHT HIP - 1 VIEW  COMPARISON:  DG HIP 1 VIEW*R* dated 08/20/2013  FINDINGS: Technically suboptimal films. The right femoral head appears to remain dislocated posteriorly. Three cross-table lateral views are submitted for interpretation.  IMPRESSION: Persistent right posterior hip dislocation. Technically suboptimal views due to patient condition.   Electronically Signed   By: Dereck Ligas M.D.   On: 08/20/2013 20:56   Dg Hip 1 View Right  08/20/2013   CLINICAL DATA:  Fall with severe right hip pain.  EXAM: RIGHT HIP - 1 VIEW  COMPARISON:  03/25/2011  FINDINGS: Frontal view of the pelvis shows dislocation of the right femoral head. Associated acetabular fracture cannot be excluded. SI joints and symphysis pubis are unremarkable.  IMPRESSION: Right hip dislocation with possible associated acetabular fracture.   Electronically Signed   By: Misty Stanley M.D.   On: 08/20/2013 19:26    ROS ROS: I have reviewed the patient's review of systems thoroughly and there are no positive responses as relates to the HPI. EXAM: Blood pressure 135/77, pulse 98, temperature 98 F (36.7 C), temperature source Oral, resp.  rate 15, weight 245 lb (111.131 kg), last menstrual period 02/13/2011, SpO2 100.00%. Physical Exam Well-developed well-nourished patient in no acute distress. Alert and oriented x3 HEENT:within normal limits Cardiac: Regular rate and rhythm Pulmonary: Lungs clear to auscultation Abdomen: Soft and nontender.  Normal active bowel sounds  Musculoskeletal: r hip flexed and externally rotated.  2+ distal pulse Assessment/Plan: 53 yo female with acute r. Anterior hip dislocation who underwent emergent redustion in the ER under propofol anestheti.  She will be held  reduced with abduction pillow and undergo CT scan of r hip to evaluate for loose fragments and possible acetabular fractures.  Pt will likely need open removal of fragments if there is an acetabular fracture and possibly hip scope if loose bodies are in joint without fracture.  Pt will be admitted and additional proceedures will be discussed with the patient.  Babatunde [name] L 08/20/2013, 9:20 PM

## 2013-08-20 NOTE — ED Notes (Signed)
Bed: WA04 Expected date:  Expected time:  Means of arrival:  Comments: Hold for triage 5

## 2013-08-21 LAB — GLUCOSE, CAPILLARY
GLUCOSE-CAPILLARY: 193 mg/dL — AB (ref 70–99)
GLUCOSE-CAPILLARY: 259 mg/dL — AB (ref 70–99)
Glucose-Capillary: 258 mg/dL — ABNORMAL HIGH (ref 70–99)
Glucose-Capillary: 207 mg/dL — ABNORMAL HIGH (ref 70–99)
Glucose-Capillary: 252 mg/dL — ABNORMAL HIGH (ref 70–99)

## 2013-08-21 MED ORDER — INSULIN GLARGINE 100 UNIT/ML ~~LOC~~ SOLN
20.0000 [IU] | Freq: Every day | SUBCUTANEOUS | Status: DC
  Administered 2013-08-21 – 2013-08-22 (×2): 20 [IU] via SUBCUTANEOUS
  Filled 2013-08-21 (×4): qty 0.2

## 2013-08-21 MED ORDER — INSULIN ASPART 100 UNIT/ML ~~LOC~~ SOLN
0.0000 [IU] | SUBCUTANEOUS | Status: DC
  Administered 2013-08-21 (×2): 3 [IU] via SUBCUTANEOUS
  Administered 2013-08-21: 5 [IU] via SUBCUTANEOUS
  Administered 2013-08-21: 3 [IU] via SUBCUTANEOUS
  Administered 2013-08-21: 5 [IU] via SUBCUTANEOUS

## 2013-08-21 MED ORDER — METFORMIN HCL 500 MG PO TABS
1000.0000 mg | ORAL_TABLET | Freq: Two times a day (BID) | ORAL | Status: DC
  Administered 2013-08-21 – 2013-08-23 (×4): 1000 mg via ORAL
  Filled 2013-08-21 (×7): qty 2

## 2013-08-21 MED ORDER — INSULIN ASPART 100 UNIT/ML ~~LOC~~ SOLN
0.0000 [IU] | Freq: Three times a day (TID) | SUBCUTANEOUS | Status: DC
  Administered 2013-08-22 – 2013-08-23 (×4): 5 [IU] via SUBCUTANEOUS
  Administered 2013-08-23: 8 [IU] via SUBCUTANEOUS

## 2013-08-21 NOTE — Progress Notes (Signed)
Subjective: The patient states that she is been out of bed to chair and has pivoted.  she states that she's had minimal pain in the hip.  She is certainly not ready to go home at this point.   Objective: Vital signs in last 24 hours: Temp:  [98.2 F (36.8 C)-99.8 F (37.7 C)] 98.2 F (36.8 C) (02/25 2200) Pulse Rate:  [101-105] 102 (02/25 2200) Resp:  [18-20] 20 (02/25 2200) BP: (127-152)/(75-85) 127/81 mmHg (02/25 2200) SpO2:  [91 %-96 %] 94 % (02/25 2200) Weight:  [245 lb (111.131 kg)] 245 lb (111.131 kg) (02/24 2329)  Intake/Output from previous day: 02/24 0701 - 02/25 0700 In: 220 [I.V.:165; IV Piggyback:55] Out: -  Intake/Output this shift: Total I/O In: 240 [P.O.:240] Out: -   No results found for this basename: HGB,  in the last 72 hours No results found for this basename: WBC, RBC, HCT, PLT,  in the last 72 hours No results found for this basename: NA, K, CL, CO2, BUN, CREATININE, GLUCOSE, CALCIUM,  in the last 72 hours No results found for this basename: LABPT, INR,  in the last 72 hours  Neurologically intact ABD soft Neurovascular intact Sensation intact distally Intact pulses distally Dorsiflexion/Plantar flexion intact No cellulitis present Compartment soft  Assessment/Plan: At this point the patient needs to continue mobilization.  I am currently in the process of reviewing her CAT scan with our trauma surgeon.  I ultimately feel no surgery is the appropriate course of action.  She will obviously need to pass physical therapy in order to be discharged home.  I will see her tomorrow and make a plan based on her level of mobilization.   Vick Filter L 08/21/2013, 11:07 PM

## 2013-08-21 NOTE — Progress Notes (Signed)
Inpatient Diabetes Program Recommendations  AACE/ADA: New Consensus Statement on Inpatient Glycemic Control (2013)  Target Ranges:  Prepandial:   less than 140 mg/dL      Peak postprandial:   less than 180 mg/dL (1-2 hours)      Critically ill patients:  140 - 180 mg/dL   Reason for Visit: Hyperglycemia  Diabetes history: Type 2 DM Outpatient Diabetes medications: Lantus 43 units QHS, metformin 1000 mg bid and Novolog 4 units at B and L, 6 units at dinner Current orders for Inpatient glycemic control: Novolog sensitive Q4H, metformin 1000 mg bid  Inpatient Diabetes Program Recommendations Insulin - Basal: Please consider Lantus 20 units QHS (1/2 home dose of 43 units) Correction (SSI): Increase Novolog to moderate Q4H. HgbA1C: Need updated HgbA1C to assess glycemic control prior to hospitalization Diet: CHO mod med  Note: Will follow. Thank you. Lorenda Peck, RD, LDN, CDE Inpatient Diabetes Coordinator [phone]

## 2013-08-21 NOTE — Evaluation (Signed)
Physical Therapy Evaluation Patient Details Name: [name] MRN: [id number] DOB: 01/31/1961 Today's Date: 08/21/2013 Time: 8182-9937 PT Time Calculation (min): 47 min  PT Assessment / Plan / Recommendation History of Present Illness  Pt admitted 2/24 after slip on snow, R anterior dislocation of hip with  2 avulsion fxs of R ant/inf aspect of acetabulum.  Clinical Impression  ABD pillow removed to mobilize to edge of bed and pivot to [name] then to recliner. Pt tolerated well. Pt will benefit from PT to address problems. Pt will benefit from SNF rehab     PT Assessment  Patient needs continued PT services    Follow Up Recommendations  SNF;Supervision/Assistance - 24 hour    Does the patient have the potential to tolerate intense rehabilitation      Barriers to Discharge Decreased caregiver support      Equipment Recommendations  None recommended by PT    Recommendations for Other Services OT consult   Frequency Min 3X/week    Precautions / Restrictions Precautions Precautions: Fall Precaution Comments: R hip fracture,TDWB Right leg,Avoid excessive abduction and flexion of the Right hip Required Braces or Orthoses:  -  (abduction Pillow- NOT SPECIFIED WHEN TO WEAR. REMOVED FOR MOBILITY.) Restrictions Weight Bearing Restrictions: Yes RLE Weight Bearing: Touchdown weight bearing   Pertinent Vitals/Pain Pt reports pain in R hip 6-7,       Mobility  Bed Mobility Overal bed mobility: Needs Assistance;+2 for physical assistance;+ 2 for safety/equipment Bed Mobility: Supine to Sit Supine to sit: +2 for physical assistance;+2 for safety/equipment;Mod assist General bed mobility comments:  cues for precautions, assist tp move RLE to edge of bed, assist to right trunk, Transfers Overall transfer level: Needs assistance Equipment used: Rolling walker (2 wheeled) Transfers: Sit to/from [employer] Transfers Sit to Stand: +2 physical assistance;+2 safety/equipment;Mod  assist Stand pivot transfers: +2 physical assistance;+2 safety/equipment;Mod assist General transfer comment: cues for TDWB, pt transferred from bed to [facility] then to recliner, scooting on L foot, made 1 attempt to hop  and  had much difficulty. Pt tends to lift RW up and not pushing into RW grips.   Left pt in recliner with back  Tilted and R leg in an abducted position as if pillow between knees.   Exercises     PT Diagnosis: Difficulty walking;Acute pain  PT Problem List: Decreased range of motion;Decreased activity tolerance;Decreased mobility;Decreased safety awareness;Decreased knowledge of precautions;Decreased knowledge of use of DME;Pain PT Treatment Interventions: DME instruction;Functional mobility training;Therapeutic activities;Therapeutic exercise;Patient/family education     PT Goals(Current goals can be found in the care plan section) Acute Rehab PT Goals Patient Stated Goal: I want to get onto a BSC. PT Goal Formulation: With patient Time For Goal Achievement: 09/04/13 Potential to Achieve Goals: Good  Visit Information  Last PT Received On: 08/21/13 Assistance Needed: +2 History of Present Illness: Pt admitted 2/24 after slip on snow, R anterior dislocation of hip with  2 avulsion fxs of R ant/inf aspect of acetabulum.       Prior [place] expects to be discharged to:: Private residence Living Arrangements: Children Available Help at Discharge: Family Type of Home: House Home Access: Stairs to enter Technical brewer of Steps: 6-8 Entrance Stairs-Rails: Right;Left;Can reach both Home Layout: Two level Alternate Level Stairs-Number of Steps: flight Home Equipment: Walker - 2 wheels Prior Function Level of Independence: Independent Communication Communication: No difficulties    Cognition  Cognition Arousal/Alertness: Awake/alert Behavior During Therapy: WFL for tasks assessed/performed  Overall Cognitive Status: Within  Functional Limits for tasks assessed    Extremity/Trunk Assessment Upper Extremity Assessment Upper Extremity Assessment: Generalized weakness Lower Extremity Assessment Lower Extremity Assessment: RLE deficits/detail RLE Deficits / Details: R Leg maintained in abducted position similar to position in ABD splint, No hip flexion greater than avbout 80 degrees.    Balance    End of Session PT - End of Session Equipment Utilized During Treatment: Gait belt Activity Tolerance: Patient tolerated treatment well;Patient limited by fatigue Patient left: in chair;with call bell/phone within reach Nurse Communication: Mobility status;Precautions;Weight bearing status;Patient requests pain meds  GP     Claretha Cooper 08/21/2013, 5:44 PM Tresa Endo PT [phone]

## 2013-08-22 ENCOUNTER — Inpatient Hospital Stay (HOSPITAL_COMMUNITY): Payer: Medicare Other

## 2013-08-22 DIAGNOSIS — M25461 Effusion, right knee: Secondary | ICD-10-CM

## 2013-08-22 LAB — GLUCOSE, CAPILLARY
GLUCOSE-CAPILLARY: 262 mg/dL — AB (ref 70–99)
GLUCOSE-CAPILLARY: 259 mg/dL — AB (ref 70–99)
Glucose-Capillary: 228 mg/dL — ABNORMAL HIGH (ref 70–99)
Glucose-Capillary: 231 mg/dL — ABNORMAL HIGH (ref 70–99)
Glucose-Capillary: 333 mg/dL — ABNORMAL HIGH (ref 70–99)

## 2013-08-22 LAB — HEMOGLOBIN A1C
Hgb A1c MFr Bld: 9.4 % — ABNORMAL HIGH (ref <5.7)
Mean Plasma Glucose: 223 mg/dL — ABNORMAL HIGH (ref <117)

## 2013-08-22 MED ORDER — HYDROCODONE-ACETAMINOPHEN 10-325 MG PO TABS
1.0000 | ORAL_TABLET | ORAL | Status: DC | PRN
  Administered 2013-08-22 – 2013-08-23 (×3): 2 via ORAL
  Filled 2013-08-22 (×3): qty 2

## 2013-08-22 NOTE — Progress Notes (Signed)
Clinical Social Work Department BRIEF PSYCHOSOCIAL ASSESSMENT 08/22/2013  Patient:  OKIE, JANSSON     Account Number:  [id number]     Admit date:  08/20/2013  Clinical Social Worker:  Lacie Scotts  Date/Time:  08/22/2013 04:22 PM  Referred by:  RN  Date Referred:  08/22/2013 Referred for  SNF Placement   Other Referral:   Interview type:  Patient Other interview type:    PSYCHOSOCIAL DATA Living Status:  FAMILY Admitted from facility:   Level of care:   Primary support name:  Tamisha Grassel Primary support relationship to patient:  CHILD, ADULT Degree of support available:   supportive    CURRENT CONCERNS Current Concerns  Post-Acute Placement   Other Concerns:    SOCIAL WORK ASSESSMENT / PLAN Pt is a 53 yr old female living at home prior to hospitalization. Pt fell and dislocated her right hip. She had surgery 2/25. PT is recommending [facility] Rehab following hospital d/c. CSW met with pt / daughter to assist with d/c planning. Pt has agreed to SNF placement and SNF search has been initiated. Bed offers will be provided as received. CSW has contacted [employer] for authorization.   Assessment/plan status:  Psychosocial Support/Ongoing Assessment of Needs Other assessment/ plan:   Information/referral to community resources:   SNF list provided.    PATIENT'S/FAMILY'S RESPONSE TO PLAN OF CARE: Pt was very sleepy during CSW visit. Pt initially declined SNF placement  but is now agreeing with d/c plan. After further discussion with RN pt sees the need for rehab though she would rather go home.   Werner Lean LCSW [phone]

## 2013-08-22 NOTE — Progress Notes (Signed)
Subjective:    2 days S/P closed reduction right hip Patient reports pain as 4 on 0-10 scale.   Has been up in chair today.c/o right knee pain and swelling.She thinks she twisted her knee when she fell. Objective: Vital signs in last 24 hours: Temp:  [98.2 F (36.8 C)-99.1 F (37.3 C)] 98.6 F (37 C) (02/26 1416) Pulse Rate:  [102-111] 111 (02/26 1416) Resp:  [18-20] 18 (02/26 1416) BP: (116-141)/(73-81) 141/81 mmHg (02/26 1416) SpO2:  [93 %-96 %] 96 % (02/26 1416)  Intake/Output from previous day: 02/25 0701 - 02/26 0700 In: 1080 [P.O.:600; I.V.:480] Out: 2150 [Urine:2150] Intake/Output this shift: Total I/O In: 600 [P.O.:600] Out: 800 [Urine:800] Right hip exam:  Moderate tenderness. Some pain with ROM Right knee exam: +2 effusion.  No ligamentous instability   Able to do SLR Right calf soft and non tender   N-V intact to foot   Assessment/Plan:    2 days s/p right anterior hip dislocation. Right knee pain with effusion. Plan: Mobilize with PT  TDWB on Right Will x ray right knee ASA 325 mg BID /SCD'S for VTE prophylaxis. Hopefully can discharge home tomorrow afternoon with HHPT if passes PT Some nausea with Percocet so will switch to Norco 10 mg prn pain.   [name] G 08/22/2013, 3:28 PM

## 2013-08-22 NOTE — Progress Notes (Signed)
PT Cancellation Note  Patient Details Name: [name] MRN: [id number] DOB: Nov 10, 1960   Cancelled Treatment:    Reason Eval/Treat Not Completed: Other (comment)pt up in recliner in AM and just returned to bed when attempted 2nd time.will return in AM.   Claretha Cooper 08/22/2013, 2:54 [id number]

## 2013-08-22 NOTE — Progress Notes (Signed)
Clinical Social Work Department CLINICAL SOCIAL WORK PLACEMENT NOTE 08/22/2013  Patient:  [name], [name]  Account Number:  [id number] Admit date:  08/20/2013  Clinical Social Worker:  Werner Lean, LCSW  Date/time:  08/22/2013 04:32 PM  Clinical Social Work is seeking post-discharge placement for this patient at the following level of care:   SKILLED NURSING   (*CSW will update this form in Epic as items are completed)   08/22/2013  Patient/family provided with [place] Department of Clinical Social Work's list of facilities offering this level of care within the geographic area requested by the patient (or if unable, by the patient's family).  08/22/2013  Patient/family informed of their freedom to choose among providers that offer the needed level of care, that participate in Medicare, Medicaid or managed care program needed by the patient, have an available bed and are willing to accept the patient.    Patient/family informed of [facility]' ownership interest in [facility], as well as of the fact that they are under no obligation to receive care at this facility.  PASARR submitted to EDS on 08/22/2013 PASARR number received from EDS on 08/22/2013  FL2 transmitted to all facilities in geographic area requested by pt/family on  08/22/2013 FL2 transmitted to all facilities within larger geographic area on   Patient informed that his/her managed care company has contracts with or will negotiate with  certain facilities, including the following:     Patient/family informed of bed offers received:   Patient chooses bed at  Physician recommends and patient chooses bed at    Patient to be transferred to  on   Patient to be transferred to facility by   The following physician request were entered in Epic:   Additional Comments:  Werner Lean LCSW [phone]

## 2013-08-23 DIAGNOSIS — S72413A Displaced unspecified condyle fracture of lower end of unspecified femur, initial encounter for closed fracture: Secondary | ICD-10-CM

## 2013-08-23 DIAGNOSIS — M25061 Hemarthrosis, right knee: Secondary | ICD-10-CM | POA: Diagnosis present

## 2013-08-23 DIAGNOSIS — S72499A Other fracture of lower end of unspecified femur, initial encounter for closed fracture: Secondary | ICD-10-CM | POA: Diagnosis present

## 2013-08-23 LAB — GLUCOSE, CAPILLARY
GLUCOSE-CAPILLARY: 217 mg/dL — AB (ref 70–99)
Glucose-Capillary: 263 mg/dL — ABNORMAL HIGH (ref 70–99)
Glucose-Capillary: 213 mg/dL — ABNORMAL HIGH (ref 70–99)

## 2013-08-23 MED ORDER — HYDROCODONE-ACETAMINOPHEN 10-325 MG PO TABS: 1.0000 | ORAL_TABLET | Freq: Four times a day (QID) | ORAL | Status: DC | PRN

## 2013-08-23 MED ORDER — LIVING WELL WITH DIABETES BOOK
Freq: Once | Status: AC
  Administered 2013-08-23: 12:00:00
  Filled 2013-08-23: qty 1

## 2013-08-23 MED ORDER — ASPIRIN 325 MG PO TBEC: 325.0000 mg | DELAYED_RELEASE_TABLET | Freq: Every day | ORAL | Status: DC

## 2013-08-23 MED ORDER — LIDOCAINE HCL 2 % IJ SOLN
INTRAMUSCULAR | Status: AC
  Administered 2013-08-23: 16:00:00 via INTRAMUSCULAR
  Filled 2013-08-23: qty 20

## 2013-08-23 NOTE — Progress Notes (Signed)
Subjective:     Patient reports pain as 4 on 0-10 scale.  C/O right hip and knee pain with knee swelling. Ready to go home   Objective: Vital signs in last 24 hours: Temp:  [98.7 F (37.1 C)-99.4 F (37.4 C)] 98.7 F (37.1 C) (02/27 1341) Pulse Rate:  [100-108] 100 (02/27 1341) Resp:  [16-20] 16 (02/27 1341) BP: (121-145)/(74-80) 131/78 mmHg (02/27 1341) SpO2:  [95 %-100 %] 100 % (02/27 1341)  Intake/Output from previous day: 02/26 0701 - 02/27 0700 In: 840 [P.O.:840] Out: 800 [Urine:800] Intake/Output this shift: Total I/O In: 720 [P.O.:720] Out: 500 [Urine:500]Right knee xray showed Tiny medial avulsion fx with effusion/djd Right knee exam: Tense + 2 effustion. No ligament laxity Right hip exam     Moderate pain with ROM  Assessment/Plan:    !. S/p reduction of right hip dislocation with small fx's 2. Hemearthrosis right knee with avulsion fx off medial femoral condyle. Plan: Aspirated right knee under sterile conditions  Obtained 60 ccs of blood Will discharge home today with HHPT   May WBAT on right leg with knee immobilizer. Discharge home with home health F/U Dr Berenice Primas in 10 days.  [name] 08/23/2013, 4:23 PM

## 2013-08-23 NOTE — Progress Notes (Signed)
PHYSICAL THERAPY NOTE- Patient is planning DC to home. Pt has 6 steps to enter and will require nonemergent EMS  To get into home. Pt. Is agreeable. Pt also will need an appropriately sized wheelchair for home with Swing away leg rests as pt is not able to ambulate at this time with TDWB status. Per pt., she has a BSC  And a walker(? With wheels). Case manager May want to confirm this again with pt as she was not definitive  of these items. Recommend HHPT. Tresa Endo [phone]

## 2013-08-23 NOTE — Progress Notes (Signed)
[facility] informed CSW that pt was seen by MD and pt will be d/c home with family support and [facility] services. CSW is available to assist with SNF placement if plan changes. RNCM will assist with d/c planning.  Werner Lean LCSW [phone]

## 2013-08-23 NOTE — Progress Notes (Addendum)
[facility] is providing the following services: Wheelchair and RW  If patient discharges after hours, please call [phone].   Linward Headland 08/23/2013, 11:08 AM

## 2013-08-23 NOTE — Care Management Note (Signed)
    Page 1 of 2   08/23/2013     1:32:48 PM   CARE MANAGEMENT NOTE 08/23/2013  Patient:  [name], [name]   Account Number:  [id number]  Date Initiated:  08/21/2013  Documentation initiated by:  Sunday Spillers  Subjective/Objective Assessment:   53 yo female admitted s/p fall with hip dislocation. PTA lived at home with dtr.     Action/Plan:   Home when stable   Anticipated DC Date:  08/23/2013   Anticipated DC Plan:  [place] referral  Clinical Social Worker      DC Forensic scientist  CM consult      [facility] Choice  [place]   Choice offered to / List presented to:  C-1 Patient   DME arranged  [place]      DME agency  [place] arranged  [place].   Status of service:  Completed, signed off Medicare Important Message given?  NA - LOS <3 / Initial given by admissions (If response is "NO", the following Medicare IM given date fields will be blank) Date Medicare IM given:   Date Additional Medicare IM given:    Discharge Disposition:  [place]  Per UR Regulation:  Reviewed for med. necessity/level of care/duration of stay  If discussed at [place] of Stay Meetings, dates discussed:    Comments:

## 2013-08-23 NOTE — Progress Notes (Signed)
Inpatient Diabetes Program Recommendations  AACE/ADA: New Consensus Statement on Inpatient Glycemic Control (2013)  Target Ranges:  Prepandial:   less than 140 mg/dL      Peak postprandial:   less than 180 mg/dL (1-2 hours)      Critically ill patients:  140 - 180 mg/dL   Reason for Visit: Hyperglycemia  Current orders for Inpatient glycemic control: Lantus 20 units QHS and Novolog moderate tidwc  Results for [facility] (MRN [id number]) as of 08/23/2013 11:31  Ref. Range 08/22/2013 07:21 08/22/2013 11:41 08/22/2013 17:07 08/22/2013 21:32 08/23/2013 07:12  Glucose-Capillary Latest Range: 70-99 mg/dL 228 (H) 231 (H) 259 (H) 333 (H) 263 (H)  Results for CARESS, REFFITT (MRN [id number]) as of 08/23/2013 11:31  Ref. Range 08/22/2013 04:50  Hemoglobin A1C Latest Range: <5.7 % 9.4 (H)   Hyperglycemia and HgbA1C of 9.4% indicates poor glycemic control at home. Would benefit from OP Diabetes Education consult for uncontrolled DM. Pt will be going to ST SNF at discharge.  Recommendations: Increase Lantus to home dose of 43 units QHS Add Novolog 4 units tidwc for meal coverage insulin Add Novolog HS correction. Will order Living Well With Diabetes book and encourage pt to view diabetes videos on pt ed channel.  Thank you. Lorenda Peck, RD, LDN, CDE Inpatient Diabetes Coordinator [phone]

## 2013-08-24 ENCOUNTER — Other Ambulatory Visit: Payer: Self-pay | Admitting: Internal Medicine

## 2013-08-26 ENCOUNTER — Telehealth: Payer: Self-pay | Admitting: *Deleted

## 2013-08-26 ENCOUNTER — Encounter: Payer: Medicare Other | Admitting: Internal Medicine

## 2013-08-26 NOTE — Telephone Encounter (Signed)
Received a call from [facility] requesting our NPI number for authorization. I informed them that this is not our patient and I can only give a one time authorization and she will need to get her medicaid card changed.Jaxsyn Azam, Kevin Fenton

## 2013-09-02 NOTE — Discharge Summary (Signed)
Patient ID: [name] MRN: [id number] DOB/AGE: Dec 19, 1960 53 y.o.  Admit date: 08/20/2013 Discharge date: 08/23/2013 Admission Diagnoses:  Principal Problem:   Hip dislocation, right Active Problems:   Uncontrolled type 2 diabetes with neuropathy   OBESITY   Lumbar back pain   Knee pain, right   Anterior dislocation of right hip   Effusion of right knee   Avulsion fracture of femoral condyle   Hemarthrosis of right knee   Discharge Diagnoses:  Same  Past Medical History  Diagnosis Date  . Hypertension   . Hyperlipidemia   . GERD (gastroesophageal reflux disease)   . Adhesive capsulitis of shoulder     bilateral, Steroid injection Dr. Truman Hayward 1/12 bilaterally  . Peripheral neuropathy   . Adhesive capsulitis of right shoulder     Steroid injection Dr. Truman Hayward 1/12  . Adhesive capsulitis of left shoulder     Steroid injection Dr. Truman Hayward 1/12  . Obesity   . Diabetic retinopathy   . Glaucoma   . Diabetes mellitus     Type II, insulin dependent  . CAD (coronary artery disease)     nonobstructive. Last cardiac cath (2008) showing left circumflex with mid 50% stenosis and distal luminal irregularities. Also with RCA with mid to distal 30-40% stenosis. // Previously evaluated by [facility], never followed up outpatient.  . CHF (congestive heart failure)   . CAP (community acquired pneumonia) 06/15/2012    05/2012 CXR: Mild opacification of the posterior lung base on the lateral film  as cannot exclude infection/atelectasis.      Surgeries: closed reduction right hip in the emergency department.  Dr. Berenice Primas.   Consultants: Treatment Team:  Rozanna Box, MD  Discharged Condition: Improved  Hospital Course: [name] is an 53 y.o. female who was admitted 08/20/2013 for operative treatment ofHip dislocation, right. Patient has severe unremitting pain that affects sleep, daily activities, and work/hobbies. In the emergency department the patient underwent sedation by  the emergency room physician and closed reduction of the right hip dislocation by Dr. Berenice Primas.  Patient was admitted to the hospital for pain control, physical therapy, and further workup of her right hip fracture dislocation with CT scan.  Patient was given perioperative antibiotics:  Anti-infectives   None       Patient was given sequential compression devices, early ambulation, and chemoprophylaxis to prevent DVT. She was seen by physical therapy on a daily basis. A CT scan was ordered on her pelvis and right hip to evaluate her hip for a fracture.Dr.Handy was consulted from an orthopedic trauma viewpoint to evaluate the patient as to need for further surgical intervention versus conservative nonoperative management.The patient also complained of significant pain and swelling in her right knee. x-rays showed an avulsion fracture off of the medial femoral condyle with an effusion. Her right knee was aspirated and 60 cc of blood was obtained.  The patient was placed in a knee immobilizer.  She had no ligamentous instability.  Patient benefited maximally from hospital stay and there were no complications.    Recent vital signs: see chart.    Recent laboratory studies: No results found for this basename: WBC, HGB, HCT, PLT, NA, K, CL, CO2, BUN, CREATININE, GLUCOSE, PT, INR, CALCIUM, 2,  in the last 72 hours   Discharge Medications:     Medication List    STOP taking these medications       HYDROcodone-acetaminophen 5-325 MG per tablet  Commonly known as:  NORCO/VICODIN  Replaced by:  HYDROcodone-acetaminophen 10-325 MG per tablet      TAKE these medications       amitriptyline 25 MG tablet  Commonly known as:  ELAVIL  Take 25 mg by mouth at bedtime.     aspirin 325 MG EC tablet  Take 1 tablet (325 mg total) by mouth daily.     carvedilol 6.25 MG tablet  Commonly known as:  COREG  Take 1 tablet (6.25 mg total) by mouth 2 (two) times daily.     diazepam 5 MG tablet  Commonly  known as:  VALIUM  Take 1 tablet (5 mg total) by mouth every 8 (eight) hours as needed for muscle spasms.     dorzolamide-timolol 22.3-6.8 MG/ML ophthalmic solution  Commonly known as:  COSOPT  Place 1 drop into the right eye 2 (two) times daily.     ferrous sulfate 325 (65 FE) MG tablet  Take 1 tablet (325 mg total) by mouth daily with breakfast.     furosemide 40 MG tablet  Commonly known as:  LASIX  Take 20 mg by mouth daily.     gabapentin 300 MG capsule  Commonly known as:  NEURONTIN  Take 300 mg by mouth 2 (two) times daily.     HYDROcodone-acetaminophen 10-325 MG per tablet  Commonly known as:  NORCO  Take 1-2 tablets by mouth every 6 (six) hours as needed for moderate pain or severe pain.     insulin aspart 100 UNIT/ML injection  Commonly known as:  novoLOG  Inject 4-6 Units into the skin 3 (three) times daily before meals. 4 units in the morning and 6 units in the evening     insulin glargine 100 UNIT/ML injection  Commonly known as:  LANTUS  Inject 0.43 mLs (43 Units total) into the skin at bedtime.     lisinopril 20 MG tablet  Commonly known as:  PRINIVIL,ZESTRIL  TAKE HALF TABLET BY MOUTH DAILY     metFORMIN 1000 MG tablet  Commonly known as:  GLUCOPHAGE  TAKE 1 TABLET BY MOUTH TWICE DAILY WITH A MEAL     omeprazole 20 MG capsule  Commonly known as:  PRILOSEC  Take 1 capsule (20 mg total) by mouth daily.     ondansetron 4 MG disintegrating tablet  Commonly known as:  ZOFRAN ODT  Take 1 tablet (4 mg total) by mouth every 8 (eight) hours as needed for nausea or vomiting.     pravastatin 20 MG tablet  Commonly known as:  PRAVACHOL  Take 20 mg by mouth daily.     traMADol 50 MG tablet  Commonly known as:  ULTRAM  Take 50 mg by mouth every 6 (six) hours as needed for moderate pain.        Diagnostic Studies: Dg Hip 1 View Right  08/20/2013   CLINICAL DATA:  Right hip dislocation.  EXAM: RIGHT HIP - 1 VIEW  COMPARISON:  DG HIP 1 VIEW*R* dated 08/20/2013   FINDINGS: Technically suboptimal films. The right femoral head appears to remain dislocated posteriorly. Three cross-table lateral views are submitted for interpretation.  IMPRESSION: Persistent right posterior hip dislocation. Technically suboptimal views due to patient condition.   Electronically Signed   By: Dereck Ligas M.D.   On: 08/20/2013 20:56   Dg Hip 1 View Right  08/20/2013   CLINICAL DATA:  Fall with severe right hip pain.  EXAM: RIGHT HIP - 1 VIEW  COMPARISON:  03/25/2011  FINDINGS: Frontal view of the pelvis shows dislocation of the  right femoral head. Associated acetabular fracture cannot be excluded. SI joints and symphysis pubis are unremarkable.  IMPRESSION: Right hip dislocation with possible associated acetabular fracture.   Electronically Signed   By: Misty Stanley M.D.   On: 08/20/2013 19:26   Ct Pelvis Wo Contrast  08/20/2013   CLINICAL DATA:  Status post reduction of right femoral head dislocation. Assess for acetabular fracture.  EXAM: CT PELVIS WITHOUT CONTRAST  TECHNIQUE: Multidetector CT imaging of the pelvis was performed following the standard protocol without intravenous contrast.  COMPARISON:  Right hip radiographs performed earlier today at 7:09 p.m.  FINDINGS: There appear to be two tiny avulsion fracture fragments along the inferior aspect of the right hip joint space, likely arising from the anterior-inferior aspect of the right acetabular column. No additional fractures are identified. The proximal right femur appears grossly intact. Previously suggested slight lucency at the right femoral neck is thought to reflect adjacent osteophyte formation. A small right hip joint effusion is suggested.  There is no significant evidence for soft tissue injury. The left hip joint is unremarkable in appearance. The vasculature is not well assessed without contrast. The sacroiliac joints within normal limits.  Scattered vascular calcifications are seen. The visualized small large  bowel are grossly unremarkable in appearance. The bladder is moderately distended and grossly unremarkable. A bicornuate uterus is suggested, though this is not well characterized.  IMPRESSION: 1. Two apparent tiny avulsion fracture fragments arising along the inferior aspect of the right hip joint space, likely arising from the anterior-inferior aspect of the right acetabular column. No additional fractures seen. 2. Small right hip joint effusion suggested. 3. Suggestion of bicornuate uterus, though this is not well characterized.   Electronically Signed   By: Garald Balding M.D.   On: 08/20/2013 22:34   Dg Pelvis Portable  08/20/2013   CLINICAL DATA:  Status post reduction of a right hip dislocation.  EXAM: PORTABLE PELVIS 1-2 VIEWS  COMPARISON:  None.  FINDINGS: Mild femoral head and neck junction spur formation on the right. Small linear lucency within or overlying the lateral aspect of the right femoral head and neck junction. Otherwise, no fracture or dislocation seen on this single portable view.  IMPRESSION: Mild right hip degenerative changes and possible nondisplaced fracture of the right femoral head and neck junction. Dedicated non portable right hip radiographs are recommended.   Electronically Signed   By: Enrique Sack M.D.   On: 08/20/2013 21:25   Dg Knee Complete 4 Views Right  08/22/2013   CLINICAL DATA:  Right knee pain and swelling status post fall  EXAM: RIGHT KNEE - COMPLETE 4+ VIEW  COMPARISON:  None.  FINDINGS: Four views of the right knee reveal the bones to be adequately mineralized. There is mild beaking of the tibial spines. There is subtle bony density lateral to the medial femoral condyle. On the frontal view there may be an adjacent donor site. The overlying soft tissues are swollen. A joint effusion is present superior to the patella.  IMPRESSION: 1. There is irregular faint bony density adjacent to the midportion of the medial femoral condyle. This could reflect small avulsion.  There is no evidence of acute fracture elsewhere. 2. There are degenerative changes of the knee manifested chiefly by beaking of the tibial spines. 3. There is an effusion superior to the patella.   Electronically Signed   By: David  [place]ique   On: 08/22/2013 17:14    Disposition: 06-Home-Health Care Svc  Discharge Orders   Future Orders Complete By Expires   Call MD / Call 911  As directed    Comments:     If you experience chest pain or shortness of breath, CALL 911 and be transported to the hospital emergency room.  If you develope a fever above 101 F, pus (white drainage) or increased drainage or redness at the wound, or calf pain, call your surgeon's office.   Diet Carb Modified  As directed    Increase activity slowly as tolerated  As directed    Weight bearing as tolerated  As directed    Questions:     Laterality:  right   Extremity:  Lower    she will wear a knee immobilizer on her right lower extremity. followup with Dr. Berenice Primas in 10 days in the office.  She will call for an appointment.    SignedErlene Senters 09/02/2013, 4:37 PM

## 2013-09-13 ENCOUNTER — Encounter: Payer: Self-pay | Admitting: Internal Medicine

## 2013-09-16 ENCOUNTER — Other Ambulatory Visit: Payer: Self-pay | Admitting: Internal Medicine

## 2013-09-18 ENCOUNTER — Ambulatory Visit: Payer: Medicare Other | Admitting: Internal Medicine

## 2013-09-29 ENCOUNTER — Other Ambulatory Visit: Payer: Self-pay | Admitting: Internal Medicine

## 2013-09-30 ENCOUNTER — Other Ambulatory Visit: Payer: Self-pay | Admitting: *Deleted

## 2013-10-01 ENCOUNTER — Encounter: Payer: Self-pay | Admitting: Internal Medicine

## 2013-10-01 ENCOUNTER — Other Ambulatory Visit: Payer: Self-pay | Admitting: Internal Medicine

## 2013-10-01 ENCOUNTER — Ambulatory Visit: Payer: Medicare Other | Admitting: Internal Medicine

## 2013-10-01 ENCOUNTER — Ambulatory Visit (INDEPENDENT_AMBULATORY_CARE_PROVIDER_SITE_OTHER): Payer: Medicare Other | Admitting: Internal Medicine

## 2013-10-01 ENCOUNTER — Other Ambulatory Visit: Payer: Self-pay | Admitting: Dietician

## 2013-10-01 VITALS — BP 125/80 | HR 97 | Temp 97.7°F | Ht 66.0 in | Wt 251.1 lb

## 2013-10-01 DIAGNOSIS — E1165 Type 2 diabetes mellitus with hyperglycemia: Secondary | ICD-10-CM

## 2013-10-01 DIAGNOSIS — E1142 Type 2 diabetes mellitus with diabetic polyneuropathy: Secondary | ICD-10-CM

## 2013-10-01 DIAGNOSIS — E1140 Type 2 diabetes mellitus with diabetic neuropathy, unspecified: Secondary | ICD-10-CM

## 2013-10-01 DIAGNOSIS — IMO0002 Reserved for concepts with insufficient information to code with codable children: Secondary | ICD-10-CM

## 2013-10-01 DIAGNOSIS — M545 Low back pain, unspecified: Secondary | ICD-10-CM

## 2013-10-01 DIAGNOSIS — M25551 Pain in right hip: Secondary | ICD-10-CM

## 2013-10-01 DIAGNOSIS — N390 Urinary tract infection, site not specified: Secondary | ICD-10-CM

## 2013-10-01 DIAGNOSIS — IMO0001 Reserved for inherently not codable concepts without codable children: Secondary | ICD-10-CM

## 2013-10-01 DIAGNOSIS — E1149 Type 2 diabetes mellitus with other diabetic neurological complication: Secondary | ICD-10-CM

## 2013-10-01 DIAGNOSIS — E785 Hyperlipidemia, unspecified: Secondary | ICD-10-CM

## 2013-10-01 DIAGNOSIS — R350 Frequency of micturition: Secondary | ICD-10-CM

## 2013-10-01 LAB — POCT URINALYSIS DIPSTICK
Bilirubin, UA: NEGATIVE
GLUCOSE UA: 500
Ketones, UA: NEGATIVE
Nitrite, UA: POSITIVE
PROTEIN UA: 30
Spec Grav, UA: 1.015
UROBILINOGEN UA: 0.2
pH, UA: 5

## 2013-10-01 LAB — LIPID PANEL
CHOLESTEROL: 182 mg/dL (ref 0–200)
HDL: 57 mg/dL (ref >39)
LDL Cholesterol: 100 mg/dL — ABNORMAL HIGH (ref 0–99)
TRIGLYCERIDES: 123 mg/dL (ref <150)
Total CHOL/HDL Ratio: 3.2 Ratio
VLDL: 25 mg/dL (ref 0–40)

## 2013-10-01 LAB — GLUCOSE, CAPILLARY: Glucose-Capillary: 316 mg/dL — ABNORMAL HIGH (ref 70–99)

## 2013-10-01 MED ORDER — HYDROCODONE-ACETAMINOPHEN 5-325 MG PO TABS: 1.0000 | ORAL_TABLET | Freq: Four times a day (QID) | ORAL | Status: DC | PRN

## 2013-10-01 MED ORDER — CIPROFLOXACIN HCL 500 MG PO TABS: 500.0000 mg | ORAL_TABLET | Freq: Two times a day (BID) | ORAL | Status: AC

## 2013-10-01 NOTE — Assessment & Plan Note (Signed)
Urine dipstick positive for nitrites and leucocytes.  Sent for urine culture Rx ciprofloxacin 500mg  BID for 7 days

## 2013-10-01 NOTE — Assessment & Plan Note (Signed)
She states that the pain is at its baseline.  She has controlled medication contract for hydrocodone 5mg -325mg  1-2 tablets q6h PRN for pain #90, provided one refill. Pt advised to follow up with her PCP for additional refills. Pt also advised to follow up with her Orthopedic Surgeon for further eval/tx of her right knee, right hip fractures and chronic low back pain. She has an appointment with Dr. Berenice Primas on May 7th.

## 2013-10-01 NOTE — Progress Notes (Signed)
   Subjective:    Patient ID: [name], female    DOB: [birth year], 53 y.o.   MRN: [id number]  HPI [name] is a 53 yr old woman with PMH significant for DM2, CAD, chronic sciatic pain, who presents for refill of her pain medication. She is on the pain medication contract but is not sure if she is supposed to get tramadol and Vicodin  or only Vicodin. She states that she has been given both prescriptions recently.  She initially stated that she had not had pain medications for months but reported finishing her last Vicodin last week after being reminded that she was in the hospital for a broken/dislocated right hip in late February.  She was given prescription for Vicodin 46m-325mg  #60 on 08/23/13 prior to her discharge from the hospital.  She has not followed up with Orthopedic Surgery since her discharge but has an appointment with Dr. Berenice Primas on May 7th.  During this visit she also complained of dysuria for 2-3 days.  Of note, medication reconciliation could not be accurately done during this visit as she could not recall the medications she is taking and did not have her pill bottles with her during this visit.   Review of Systems  Constitutional: Negative for fever, chills, diaphoresis, activity change, appetite change, fatigue and unexpected weight change.  Respiratory: Negative for cough, shortness of breath and wheezing.   Cardiovascular: Negative for chest pain and palpitations.  Gastrointestinal: Negative for abdominal pain.  Genitourinary: Positive for dysuria.  Musculoskeletal: Positive for arthralgias, back pain and gait problem.  Neurological: Negative for dizziness and light-headedness.  Psychiatric/Behavioral: Negative for agitation.       Objective:   Physical Exam  Nursing note and vitals reviewed. Constitutional: She is oriented to person, place, and time. No distress.  Obese  Eyes: Conjunctivae are normal. No scleral icterus.  Cardiovascular: Normal rate and  regular rhythm.   Pulmonary/Chest: Effort normal and breath sounds normal. No respiratory distress. She has no wheezes. She has no rales.  Abdominal: Soft. There is no tenderness.  Musculoskeletal: She exhibits tenderness. She exhibits no edema.  Bilateral paraspinal tenderness of lumbar back, ttp of right hip. ROM of bilateral hips and knees limited 2/2 pain. Bilateral knees with no effusion  Neurological: She is alert and oriented to person, place, and time. Coordination abnormal.  Skin: Skin is warm and dry. No rash noted. She is not diaphoretic. No erythema. No pallor.  Psychiatric: She has a normal mood and affect.          Assessment & Plan:

## 2013-10-01 NOTE — Telephone Encounter (Signed)
Patient with poor vision. We discussed her current use of vials and syringes and she suspects she is making insulin errors. Reports hypoglycemic episode of 60 ~ 2 am in the past month. Patient did not bring meter today but agreed to schedule an appointment with CDE and bring it back to go over her readings/patterns

## 2013-10-01 NOTE — Patient Instructions (Addendum)
General Instructions: -You have a bladder infection. Take Ciprofloxacin 500mg  twice per day for 7 days.  -Follow up with Dr. Berenice Primas next month.  -Continue taking your medications as prescribed.  -Bring all your medications with you during your next visit.  -Follow up with Dr. Michail Sermon, your primary care doctor, as soon as possible for review of your medications.    Treatment Goals:  Goals (1 Years of Data) as of 10/01/13         As of Today 08/23/13 08/23/13 08/22/13 08/22/13     Blood Pressure    . Blood Pressure < 140/90  125/80 131/78 145/80 121/74 141/81     Lifestyle    . Prevent Falls           Result Component    . HEMOGLOBIN A1C < 7.0     9.4     . LDL CALC < 100            Progress Toward Treatment Goals:  Treatment Goal 10/01/2013  Hemoglobin A1C unchanged  Blood pressure at goal  Prevent falls -    Self Care Goals & Plans:  Self Care Goal 10/01/2013  Manage my medications take my medicines as prescribed; bring my medications to every visit; refill my medications on time  Monitor my health -  Eat healthy foods drink diet soda or water instead of juice or soda; eat baked foods instead of fried foods; eat foods that are low in salt  Be physically active -  Meeting treatment goals maintain the current self-care plan    Home Blood Glucose Monitoring 10/01/2013  Check my blood sugar no home glucose monitoring  When to check my blood sugar -     Care Management & Community Referrals:  Referral 10/01/2013  Referrals made for care management support none needed

## 2013-10-01 NOTE — Assessment & Plan Note (Addendum)
Pt was not sure if she was taking insulin. She met with Debera Lat and reported that she had not been on insulin--insulin has been discontinued from her med list.  Will continue metformin 1000 mg BID.  Pt was urged to bring all her medication bottles during her next visit with her PCP for med reconciliation.  She has seen her ophthalmologist and has cataract surgery scheduled for April 20th She declined diabetic foot exam during this visit.

## 2013-10-02 ENCOUNTER — Telehealth: Payer: Self-pay | Admitting: Dietician

## 2013-10-02 NOTE — Telephone Encounter (Signed)
Called about foot doctor's name so we can request records. Podiatrist is  Dr. Charlynne Cousins [facility].  Patient also asks about her refills for metformin, insulin pens and neurontin saying she needs the neurontin because she has been out for 2 days. Will route this note to PCP and triage nurse to follow up.

## 2013-10-02 NOTE — Progress Notes (Signed)
Case discussed with Dr. Hayes Ludwig at time of visit.  We reviewed the resident's history and exam and pertinent patient test results.  I agree with the assessment, diagnosis, and plan of care documented in the resident's note. This patient needs to bring all of her medications on next visit.

## 2013-10-03 ENCOUNTER — Other Ambulatory Visit: Payer: Self-pay | Admitting: *Deleted

## 2013-10-03 LAB — URINE CULTURE: Colony Count: >=100000

## 2013-10-03 MED ORDER — INSULIN ASPART 100 UNIT/ML FLEXPEN: PEN_INJECTOR | SUBCUTANEOUS | Status: DC

## 2013-10-03 MED ORDER — AMITRIPTYLINE HCL 25 MG PO TABS: 25.0000 mg | ORAL_TABLET | Freq: Every day | ORAL | Status: DC

## 2013-10-03 MED ORDER — INSULIN GLARGINE 100 UNIT/ML SOLOSTAR PEN: PEN_INJECTOR | SUBCUTANEOUS | Status: DC

## 2013-10-03 MED ORDER — INSULIN PEN NEEDLE 31G X 5 MM MISC: Status: DC

## 2013-10-03 NOTE — Telephone Encounter (Signed)
Dr Michail Sermon is on vacation.

## 2013-10-03 NOTE — Telephone Encounter (Signed)
I have refilled the medications, but instead of giving 11 refills I have curtailed down to 1. Request Dr Michail Sermon to resume when she comes back, in case there are other things that she wants to take into consideration which I may not be aware of.

## 2013-10-05 ENCOUNTER — Other Ambulatory Visit: Payer: Self-pay | Admitting: Internal Medicine

## 2013-10-07 ENCOUNTER — Telehealth: Payer: Self-pay | Admitting: Dietician

## 2013-10-07 NOTE — Telephone Encounter (Signed)
Patient says she has not been able to get her neurontim yet. Has been out since last week. Will forward to triage nurse. It appears there is refill request pending for this medicine

## 2013-10-08 NOTE — Telephone Encounter (Signed)
Thank you Dr. Ellwood Dense.

## 2013-10-09 ENCOUNTER — Telehealth: Payer: Self-pay | Admitting: *Deleted

## 2013-10-09 NOTE — Telephone Encounter (Signed)
Pt called asking why her gabapentin was refused, after reviewing med list, hospital notes and disch meds it is discovered that a pharm tech d'cd gabapentin in the ED on 2/24. When pt was discharged the gabapentin was resumed pt has been taking on a regular basis until the refill was refused, she was quite upset when she called as she states she can tell a difference in her status without it. Please review the chart and reconsider.

## 2013-10-10 ENCOUNTER — Other Ambulatory Visit: Payer: Self-pay | Admitting: Internal Medicine

## 2013-10-10 DIAGNOSIS — IMO0002 Reserved for concepts with insufficient information to code with codable children: Secondary | ICD-10-CM

## 2013-10-10 DIAGNOSIS — E1165 Type 2 diabetes mellitus with hyperglycemia: Secondary | ICD-10-CM

## 2013-10-10 DIAGNOSIS — E1140 Type 2 diabetes mellitus with diabetic neuropathy, unspecified: Secondary | ICD-10-CM

## 2013-10-10 MED ORDER — GABAPENTIN 300 MG PO CAPS: 300.0000 mg | ORAL_CAPSULE | Freq: Two times a day (BID) | ORAL | Status: DC

## 2013-10-10 NOTE — Telephone Encounter (Signed)
Thanks Bonnita Nasuti, I will refill as previously prescribed.

## 2013-10-21 ENCOUNTER — Encounter: Payer: Medicare Other | Admitting: Internal Medicine

## 2013-10-21 ENCOUNTER — Encounter: Payer: Self-pay | Admitting: Internal Medicine

## 2013-10-28 ENCOUNTER — Telehealth: Payer: Self-pay | Admitting: Internal Medicine

## 2013-10-28 NOTE — Telephone Encounter (Signed)
Called Dr. Zenia Resides office patient has been in on 3 separate occasions beginning 09/23/13-10/15/2013.  All office visits are being faxed.

## 2013-10-29 ENCOUNTER — Ambulatory Visit (INDEPENDENT_AMBULATORY_CARE_PROVIDER_SITE_OTHER): Payer: Medicare Other | Admitting: Internal Medicine

## 2013-10-29 ENCOUNTER — Encounter: Payer: Self-pay | Admitting: Internal Medicine

## 2013-10-29 VITALS — BP 137/73 | HR 82 | Temp 97.2°F | Wt 256.3 lb

## 2013-10-29 DIAGNOSIS — IMO0002 Reserved for concepts with insufficient information to code with codable children: Secondary | ICD-10-CM

## 2013-10-29 DIAGNOSIS — B351 Tinea unguium: Secondary | ICD-10-CM | POA: Insufficient documentation

## 2013-10-29 DIAGNOSIS — G609 Hereditary and idiopathic neuropathy, unspecified: Secondary | ICD-10-CM

## 2013-10-29 DIAGNOSIS — E1142 Type 2 diabetes mellitus with diabetic polyneuropathy: Secondary | ICD-10-CM

## 2013-10-29 DIAGNOSIS — E1165 Type 2 diabetes mellitus with hyperglycemia: Secondary | ICD-10-CM

## 2013-10-29 DIAGNOSIS — E1140 Type 2 diabetes mellitus with diabetic neuropathy, unspecified: Secondary | ICD-10-CM

## 2013-10-29 DIAGNOSIS — E785 Hyperlipidemia, unspecified: Secondary | ICD-10-CM

## 2013-10-29 DIAGNOSIS — E1149 Type 2 diabetes mellitus with other diabetic neurological complication: Secondary | ICD-10-CM

## 2013-10-29 MED ORDER — AMITRIPTYLINE HCL 25 MG PO TABS: 25.0000 mg | ORAL_TABLET | Freq: Every day | ORAL | Status: DC

## 2013-10-29 MED ORDER — PRAVASTATIN SODIUM 20 MG PO TABS: 20.0000 mg | ORAL_TABLET | Freq: Every day | ORAL | Status: DC

## 2013-10-29 NOTE — Assessment & Plan Note (Signed)
Poorly controlled DM. Discussed at length the need for better control of her DM. Patient didn't bring her glucometer to this office visit. Recommended to check her Blood sugars daily and bring the glucometer to each office visit. Plans: Recommended follow up with her PCP in 1-2 weeks. Patients needs closer follow ups while titrating her insulin regimen.

## 2013-10-29 NOTE — Patient Instructions (Signed)
Please call and make an appointment with your podiatry and follow up with them as recommended. Take all the medications as advised. Check your blood sugars 4 times daily: Before each meal and at bedtime. Bring your glucometer to your next office visit. Follow up with Dr. Michail Sermon in 1-2 weeks.

## 2013-10-29 NOTE — Progress Notes (Signed)
Subjective:   Patient ID: [name] female   DOB: 08/22/1960 53 y.o.   MRN: [id number]  HPI: [name] is a 53 y.o. woman with PMH significant for HTN, DM-II, NICM, CAD comes to the office requesting for a podiatry referral.   Patient reports that [facility] for her foot and nail problems. She called them recently to make a follow up appointment and was told that they need another referral from PCP to be seen.  Patient reports that her toe nails on both sides are getting worse in the last few months, especially the 1st,3rd toe on right foot and 1st toe on left foot. She denies any fever, chills, body pains, any discoloration of the toes. She denies any pain in the toes.   She is requesting for a refill on elavil and pravastatin. She denies any other complaints during this office visit.   Past Medical History  Diagnosis Date  . Hypertension   . Hyperlipidemia   . GERD (gastroesophageal reflux disease)   . Adhesive capsulitis of shoulder     bilateral, Steroid injection Dr. Truman Hayward 1/12 bilaterally  . Peripheral neuropathy   . Adhesive capsulitis of right shoulder     Steroid injection Dr. Truman Hayward 1/12  . Adhesive capsulitis of left shoulder     Steroid injection Dr. Truman Hayward 1/12  . Obesity   . Diabetic retinopathy   . Glaucoma   . Diabetes mellitus     Type II, insulin dependent  . CAD (coronary artery disease)     nonobstructive. Last cardiac cath (2008) showing left circumflex with mid 50% stenosis and distal luminal irregularities. Also with RCA with mid to distal 30-40% stenosis. // Previously evaluated by [facility], never followed up outpatient.  . CHF (congestive heart failure)   . CAP (community acquired pneumonia) 06/15/2012    05/2012 CXR: Mild opacification of the posterior lung base on the lateral film  as cannot exclude infection/atelectasis.     Current Outpatient Prescriptions  Medication Sig Dispense  Refill  . amitriptyline (ELAVIL) 25 MG tablet Take 1 tablet (25 mg total) by mouth at bedtime.  30 tablet  0  . aspirin EC 325 MG EC tablet Take 1 tablet (325 mg total) by mouth daily.  30 tablet  0  . carvedilol (COREG) 6.25 MG tablet Take 1 tablet (6.25 mg total) by mouth 2 (two) times daily.  60 tablet  11  . diazepam (VALIUM) 5 MG tablet Take 1 tablet (5 mg total) by mouth every 8 (eight) hours as needed for muscle spasms.  20 tablet  0  . dorzolamide-timolol (COSOPT) 22.3-6.8 MG/ML ophthalmic solution Place 1 drop into the right eye 2 (two) times daily.      . ferrous sulfate 325 (65 FE) MG tablet Take 1 tablet (325 mg total) by mouth daily with breakfast.  30 tablet  11  . furosemide (LASIX) 40 MG tablet Take 20 mg by mouth daily.      Marland Kitchen gabapentin (NEURONTIN) 300 MG capsule Take 1 capsule (300 mg total) by mouth 2 (two) times daily.  60 capsule  3  . HYDROcodone-acetaminophen (NORCO/VICODIN) 5-325 MG per tablet Take 1-2 tablets by mouth every 6 (six) hours as needed for moderate pain.  90 tablet  0  . insulin aspart (NOVOLOG FLEXPEN) 100 UNIT/ML FlexPen Inject 4-6 Units into the skin 3 (three) times daily before meals. 4 units in the morning and 6  units in the evening  15 mL  1  . Insulin Glargine (LANTUS SOLOSTAR) 100 UNIT/ML Solostar Pen Inject 44 units under the skin at bedtime  30 mL  1  . Insulin Pen Needle (B-D UF III MINI PEN NEEDLES) 31G X 5 MM MISC Use to inject insulin 4-5 times daily  200 each  5  . lisinopril (PRINIVIL,ZESTRIL) 20 MG tablet TAKE HALF TABLET BY MOUTH DAILY  90 tablet  0  . metFORMIN (GLUCOPHAGE) 1000 MG tablet TAKE 1 TABLET BY MOUTH TWICE DAILY WITH MEALS  180 tablet  0  . omeprazole (PRILOSEC) 20 MG capsule Take 1 capsule (20 mg total) by mouth daily.  30 capsule  11  . ondansetron (ZOFRAN ODT) 4 MG disintegrating tablet Take 1 tablet (4 mg total) by mouth every 8 (eight) hours as needed for nausea or vomiting.  20 tablet  0  . pravastatin (PRAVACHOL) 20 MG tablet  TAKE 1 TABLET BY MOUTH EVERY DAY  30 tablet  0  . promethazine (PHENERGAN) 12.5 MG tablet TAKE 1 TABLET BY MOUTH EVERY 6 HOURS AS NEEDED FOR NAUSEA OR VOMITING  30 tablet  0   No current facility-administered medications for this visit.   Family History  Problem Relation Age of Onset  . Hypertension Sister   . Hypertension Sister   . Hypertension Sister    History   Social History  . Marital Status: Single    Spouse Name: N/A    Number of Children: 2  . Years of Education: 12th grade   Occupational History  . 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    Social History Main Topics  . Smoking status: Former Smoker -- 0.30 packs/day for 7 years    Types: Cigarettes    Quit date: 06/27/2006  . Smokeless tobacco: Never Used  . Alcohol Use: No  . Drug Use: No  . Sexual Activity: Not Currently   Other Topics Concern  . None   Social History Narrative   Lives with her kids, 80yo daughter and 68 yo adopted son.   Review of Systems: Pertinent items are noted in HPI. Objective:  Physical Exam: Filed Vitals:   10/29/13 0912  BP: 137/73  Pulse: 82  Temp: 97.2 F (36.2 C)  TempSrc: Oral  Weight: 256 lb 4.8 oz (116.257 kg)  SpO2: 98%   Constitutional: Vital signs reviewed.   Patient is a well-developed and well-nourished and is in no acute distress and cooperative with exam. Alert and oriented x3.   Cardiovascular: RRR, S1 normal, S2 normal, no MRG, pulses symmetric and intact bilaterally Pulmonary/Chest: normal respiratory effort, CTAB, no wheezes, rales, or rhonchi Musculoskeletal: No joint deformities, erythema, or stiffness, ROM full and no nontender Foot exam: Severe and almost total destruction of the nail on the right 1st toe and left first toe with nail discoloration and subungual hyperkeratosis. The skin around the nail is also involved with hyperkeratosis. There is also evidence of subungual  onychomycosis.  Neurological: A&O x3, Strength is normal and symmetric bilaterally, cranial nerve II-XII are grossly intact, no focal motor deficit, sensory intact to light touch bilaterally.  Skin: Warm, dry and intact. No rash, cyanosis, or clubbing.  Psychiatric: Normal mood and affect.   Assessment & Plan:

## 2013-10-29 NOTE — Assessment & Plan Note (Signed)
Clinical exam consistent with Severe near total destruction of the nail of right 1st toe and left 1st toe, secondary to onychomycosis. There is also evidence of subungual onychomycosis and with hyperakeratosis with involvement of the skin surrounding the nail. There are no visible signs of cellulitis of the surrounding skin. Discussed with the attending regarding further management.  Plans: Referral to podiatry. Suspect patient would need both surgical excision of the nail along with oral therapy. Check CMP incase she needs to be on Terbinafine or Fluconazole. Rest of the management per Podiatry.

## 2013-10-30 NOTE — Addendum Note (Signed)
Addended by: Orson Gear on: 10/30/2013 10:43 AM   Modules accepted: Orders

## 2013-10-31 NOTE — Progress Notes (Signed)
Case discussed with Dr. Boggala at the time of the visit.  We reviewed the resident's history and exam and pertinent patient test results.  I agree with the assessment, diagnosis, and plan of care documented in the resident's note. 

## 2013-11-07 ENCOUNTER — Ambulatory Visit: Payer: Medicare Other | Admitting: Internal Medicine

## 2013-11-27 ENCOUNTER — Other Ambulatory Visit: Payer: Self-pay | Admitting: *Deleted

## 2013-11-27 DIAGNOSIS — M25551 Pain in right hip: Secondary | ICD-10-CM

## 2013-11-28 MED ORDER — HYDROCODONE-ACETAMINOPHEN 5-325 MG PO TABS: 1.0000 | ORAL_TABLET | Freq: Four times a day (QID) | ORAL | Status: DC | PRN

## 2013-11-29 NOTE — Telephone Encounter (Signed)
Rx ready - pt called/informed to pick up rx, @ designated times.

## 2013-12-10 ENCOUNTER — Other Ambulatory Visit: Payer: Self-pay | Admitting: Internal Medicine

## 2013-12-19 ENCOUNTER — Ambulatory Visit (INDEPENDENT_AMBULATORY_CARE_PROVIDER_SITE_OTHER): Payer: Medicare Other | Admitting: Internal Medicine

## 2013-12-19 ENCOUNTER — Ambulatory Visit: Payer: Medicare Other | Admitting: Internal Medicine

## 2013-12-19 ENCOUNTER — Encounter: Payer: Self-pay | Admitting: Internal Medicine

## 2013-12-19 VITALS — BP 173/89 | HR 109 | Temp 97.8°F | Ht 66.0 in | Wt 255.2 lb

## 2013-12-19 DIAGNOSIS — E1165 Type 2 diabetes mellitus with hyperglycemia: Secondary | ICD-10-CM

## 2013-12-19 DIAGNOSIS — E1140 Type 2 diabetes mellitus with diabetic neuropathy, unspecified: Secondary | ICD-10-CM

## 2013-12-19 DIAGNOSIS — E1142 Type 2 diabetes mellitus with diabetic polyneuropathy: Secondary | ICD-10-CM

## 2013-12-19 DIAGNOSIS — I10 Essential (primary) hypertension: Secondary | ICD-10-CM

## 2013-12-19 DIAGNOSIS — IMO0002 Reserved for concepts with insufficient information to code with codable children: Secondary | ICD-10-CM

## 2013-12-19 DIAGNOSIS — E1149 Type 2 diabetes mellitus with other diabetic neurological complication: Secondary | ICD-10-CM

## 2013-12-19 DIAGNOSIS — B372 Candidiasis of skin and nail: Secondary | ICD-10-CM

## 2013-12-19 MED ORDER — NYSTATIN 100000 UNIT/GM EX CREA: 1.0000 "application " | TOPICAL_CREAM | Freq: Two times a day (BID) | CUTANEOUS | Status: DC

## 2013-12-19 MED ORDER — HYDROXYZINE HCL 10 MG PO TABS: 10.0000 mg | ORAL_TABLET | Freq: Three times a day (TID) | ORAL | Status: DC | PRN

## 2013-12-19 MED ORDER — NYSTATIN 100000 UNIT/GM EX POWD: 1.0000 | Freq: Two times a day (BID) | CUTANEOUS | Status: DC

## 2013-12-19 NOTE — Assessment & Plan Note (Addendum)
Skin lesions seem very classic for candidal infection given location and character of the lesions. The patient does not have any other systemic symptoms or signs of infection. This is in the setting of poorly controlled diabetes. Also the rash has gotten worse with corticosteroid cream indicating a likely fungal component. -Nystatin powder -Nystatin cream twice a day -hydroxyzine when necessary for itching -Followup in 2 weeks

## 2013-12-19 NOTE — Progress Notes (Signed)
Subjective:    Patient ID: [name], female    DOB: [birth year], 53 y.o.   MRN: [id number]  HPI [name] is a 53 yo woman pmh as listed below presents acutely for  "itching all over."  Pt states it started about 2-3 days ago starting in her buttock and then under the folds of her breast and groin, along with her armpits. She states she had a similar episode of this a very long time ago that was consistent with candida when she was first diagnosed diabetes. She has tried over-the-counter Benadryl for the itching with minimal relief at this time, antihistamine creams that have had no effect on the rash, and one time steroid cream that she thinks either made worse or didn't change the rational. She denied any fevers or chills, nausea/vomiting/diarrhea, chest pain, shortness of breath, headache, blurry vision, scleral icterus, lower summary edema, dizziness, weeping or blisters, and no hair or lesions.  Past Medical History  Diagnosis Date  . Hypertension   . Hyperlipidemia   . GERD (gastroesophageal reflux disease)   . Adhesive capsulitis of shoulder     bilateral, Steroid injection Dr. Truman Hayward 1/12 bilaterally  . Peripheral neuropathy   . Adhesive capsulitis of right shoulder     Steroid injection Dr. Truman Hayward 1/12  . Adhesive capsulitis of left shoulder     Steroid injection Dr. Truman Hayward 1/12  . Obesity   . Diabetic retinopathy   . Glaucoma   . Diabetes mellitus     Type II, insulin dependent  . CAD (coronary artery disease)     nonobstructive. Last cardiac cath (2008) showing left circumflex with mid 50% stenosis and distal luminal irregularities. Also with RCA with mid to distal 30-40% stenosis. // Previously evaluated by [name], never followed up outpatient.  . CHF (congestive heart failure)   . CAP (community acquired pneumonia) 06/15/2012    05/2012 CXR: Mild opacification of the posterior lung base on the lateral film  as cannot exclude infection/atelectasis.     Current  Outpatient Prescriptions on File Prior to Visit  Medication Sig Dispense Refill  . amitriptyline (ELAVIL) 25 MG tablet Take 1 tablet (25 mg total) by mouth at bedtime.  30 tablet  2  . aspirin EC 325 MG EC tablet Take 1 tablet (325 mg total) by mouth daily.  30 tablet  0  . carvedilol (COREG) 6.25 MG tablet Take 1 tablet (6.25 mg total) by mouth 2 (two) times daily.  60 tablet  11  . diazepam (VALIUM) 5 MG tablet Take 1 tablet (5 mg total) by mouth every 8 (eight) hours as needed for muscle spasms.  20 tablet  0  . dorzolamide-timolol (COSOPT) 22.3-6.8 MG/ML ophthalmic solution Place 1 drop into the right eye 2 (two) times daily.      . ferrous sulfate 325 (65 FE) MG tablet Take 1 tablet (325 mg total) by mouth daily with breakfast.  30 tablet  11  . furosemide (LASIX) 40 MG tablet Take 20 mg by mouth daily.      Marland Kitchen gabapentin (NEURONTIN) 300 MG capsule Take 1 capsule (300 mg total) by mouth 2 (two) times daily.  60 capsule  3  . HYDROcodone-acetaminophen (NORCO/VICODIN) 5-325 MG per tablet Take 1-2 tablets by mouth every 6 (six) hours as needed for moderate pain.  90 tablet  0  . insulin aspart (NOVOLOG FLEXPEN) 100 UNIT/ML FlexPen Inject 4-6 Units into the skin 3 (three) times daily before meals. 4 units in the  morning and 6 units in the evening  15 mL  1  . Insulin Glargine (LANTUS SOLOSTAR) 100 UNIT/ML Solostar Pen Inject 44 units under the skin at bedtime  30 mL  1  . Insulin Pen Needle (B-D UF III MINI PEN NEEDLES) 31G X 5 MM MISC Use to inject insulin 4-5 times daily  200 each  5  . lisinopril (PRINIVIL,ZESTRIL) 20 MG tablet TAKE HALF TABLET BY MOUTH DAILY  90 tablet  0  . metFORMIN (GLUCOPHAGE) 1000 MG tablet TAKE 1 TABLET BY MOUTH TWICE DAILY WITH MEALS  180 tablet  0  . omeprazole (PRILOSEC) 20 MG capsule TAKE ONE CAPSULE BY MOUTH EVERY DAY  30 capsule  0  . ondansetron (ZOFRAN ODT) 4 MG disintegrating tablet Take 1 tablet (4 mg total) by mouth every 8 (eight) hours as needed for nausea or  vomiting.  20 tablet  0  . pravastatin (PRAVACHOL) 20 MG tablet Take 1 tablet (20 mg total) by mouth daily.  30 tablet  2  . promethazine (PHENERGAN) 12.5 MG tablet TAKE 1 TABLET BY MOUTH EVERY 6 HOURS AS NEEDED FOR NAUSEA OR VOMITING  30 tablet  0   No current facility-administered medications on file prior to visit.   Social, surgical, family history reviewed with patient and updated in appropriate chart locations.   Review of Systems Negative except As listed in the history of present illness     Objective:   Physical Exam Filed Vitals:   12/19/13 1355  BP: 173/89  Pulse: 109  Temp: 97.8 F (36.6 C)   General: resting in bed HEENT: PERRL, EOMI, no scleral icterus Cardiac: RRR, no rubs, murmurs or gallops Pulm: clear to auscultation bilaterally, moving normal volumes of air Abd: soft, nontender, nondistended, BS present Ext: warm and well perfused, no pedal edema, erythematous, macerated plaques and erosions with fine peripheral scaling and erythematous satellite papules and pustules in the groin folds, left buttock cheek, inframammary, multiple excoriation marks along the back, thighs, breasts Neuro: alert and oriented X3, cranial nerves II-XII grossly intact    Assessment & Plan:  Please see problem oriented charting  Pt discussed with Dr. Beryle Beams

## 2013-12-19 NOTE — Assessment & Plan Note (Signed)
BP Readings from Last 3 Encounters:  12/19/13 173/89  10/29/13 137/73  10/01/13 125/80   This is the first time the patient has had elevated blood pressures likely in the setting of acute irritation from the rash. The patient has continued to take her medications. Therefore we'll recheck in 2 weeks to see titration needs to be made.

## 2013-12-19 NOTE — Progress Notes (Signed)
Attending physician note: Presenting problems, physical findings, medications, review with resident physician Dr. Veverly Fells a deck and I concur with the management. Murriel Hopper, M.D., [place]

## 2013-12-19 NOTE — Assessment & Plan Note (Signed)
Patient does not bring in her meter today for review and does not want to address this issue saying "I'm only here for the rash." -Recommended followup in 2 weeks for titration of medications given poor control -Educated patient that controlling diabetes will help with preventing candida outbreaks

## 2013-12-30 ENCOUNTER — Other Ambulatory Visit: Payer: Self-pay | Admitting: Internal Medicine

## 2014-01-01 ENCOUNTER — Encounter: Payer: Self-pay | Admitting: Internal Medicine

## 2014-01-01 ENCOUNTER — Ambulatory Visit (INDEPENDENT_AMBULATORY_CARE_PROVIDER_SITE_OTHER): Payer: Medicare Other | Admitting: Internal Medicine

## 2014-01-01 VITALS — BP 130/73 | HR 102 | Temp 97.8°F | Ht 66.0 in | Wt 254.6 lb

## 2014-01-01 DIAGNOSIS — B372 Candidiasis of skin and nail: Secondary | ICD-10-CM

## 2014-01-01 MED ORDER — FLUCONAZOLE 100 MG PO TABS: 100.0000 mg | ORAL_TABLET | Freq: Every day | ORAL | Status: AC

## 2014-01-01 MED ORDER — CETIRIZINE HCL 10 MG PO CAPS: 10.0000 mg | ORAL_CAPSULE | Freq: Two times a day (BID) | ORAL | Status: DC | PRN

## 2014-01-01 NOTE — Patient Instructions (Signed)
Please take fluconazole once a day.    Please take Zyrtec 1-2 times a day as you need for itching.   Please follow-up in 7 days should your symptoms not get better.

## 2014-01-01 NOTE — Progress Notes (Signed)
   Subjective:    Patient ID: Marcene Brawn, female    DOB: August 18, 1960, 53 y.o.   MRN: [id number]  HPI Ms. Orlov is a 53 yr old woman with a history of DM2, CAD, chronic sciatic pain who presents today for reassessment for her skin rash.  She was last seen here on 6/25 where she was diagnosed with candidal intertrigo. She was given Nystatin powder and cream along with hydroxyzine prn for itching.   Since her last visit, she used Nystatin powder and cream without improvement of her rash. She also tried Monostat (miconazole) which also didn't work. The itching and rash have worsened to the point where she can't sleep.   In terms her DM, she said her sugar this morning was 92 but doesn't remember how they have been running generally. She also doesn't have her meter with her.    Review of Systems  Genitourinary: Negative for dysuria.  Skin: Positive for rash.  Psychiatric/Behavioral: Positive for sleep disturbance.  All other systems reviewed and are negative.      Objective:   Physical Exam  Constitutional: She is oriented to person, place, and time. She appears well-developed. No distress.  HENT:  Head: Normocephalic and atraumatic.  Cardiovascular: Regular rhythm and normal heart sounds.   Pulmonary/Chest: Effort normal. No respiratory distress.  Abdominal: Soft. She exhibits no distension. There is no tenderness.  Neurological: She is alert and oriented to person, place, and time.  CN II-XII grossly intact.  Skin: Skin is warm and dry. Rash noted.  Patches of erythematous plaques present mainly under her breasts, supragluteal region. Present across abdomen, lower back, and superior aspect of her arm albeit lesser extent.          Assessment & Plan:

## 2014-01-01 NOTE — Assessment & Plan Note (Signed)
-  Started patient on fluconazole 100mg /day for 1 week since she has failed topical therapy -Started patient on Zyrtec 10mg  BID prn for itching -Asked to follow-up in 1 week if symptoms do not improve -Counseled patient on the importance of monitoring sugars

## 2014-01-02 NOTE — Progress Notes (Signed)
INTERNAL MEDICINE TEACHING ATTENDING ADDENDUM - Carrie Contes, MD: I personally saw and evaluated [name] in this clinic visit in conjunction with the resident, Dr. Posey [name]. I have discussed patient's plan of care with medical resident during this visit. I have confirmed the physical exam findings and have read and agree with the clinic note including the plan with the following addition: - patient with erythematous, pruritic rash under bilateral breasts, supra gluteal region and gluteal folds - Started on fluconazole and advised to return to clinic if no improvement - may need derm referral if no improvement - c/w zyrtec prn pruritis

## 2014-01-15 ENCOUNTER — Other Ambulatory Visit: Payer: Self-pay | Admitting: *Deleted

## 2014-01-15 DIAGNOSIS — M25551 Pain in right hip: Secondary | ICD-10-CM

## 2014-01-16 MED ORDER — HYDROCODONE-ACETAMINOPHEN 5-325 MG PO TABS: 1.0000 | ORAL_TABLET | Freq: Four times a day (QID) | ORAL | Status: DC | PRN

## 2014-01-20 ENCOUNTER — Encounter: Payer: Medicare Other | Admitting: Internal Medicine

## 2014-01-20 NOTE — Progress Notes (Deleted)
   Subjective:    Patient ID: [name], female    DOB: [birth year], 53 y.o.   MRN: [id number]  HPI  Phebe Dettmer is a 53 year old female with a history of hypertension, hyperlipidemia, type II diabetes complicated by neuropathy, chronic pain of the shoulder, back, knees who presents to clinic for  Review of Systems     Objective:   Physical Exam        Assessment & Plan:

## 2014-01-22 NOTE — Progress Notes (Signed)
This encounter was created in error - please disregard.

## 2014-01-23 ENCOUNTER — Ambulatory Visit (INDEPENDENT_AMBULATORY_CARE_PROVIDER_SITE_OTHER): Payer: Medicare Other | Admitting: Internal Medicine

## 2014-01-23 DIAGNOSIS — B351 Tinea unguium: Secondary | ICD-10-CM

## 2014-01-23 MED ORDER — HYDROXYZINE HCL 10 MG PO TABS: 10.0000 mg | ORAL_TABLET | Freq: Three times a day (TID) | ORAL | Status: DC | PRN

## 2014-01-23 MED ORDER — NYSTATIN 100000 UNIT/GM EX CREA: 1.0000 "application " | TOPICAL_CREAM | Freq: Two times a day (BID) | CUTANEOUS | Status: DC

## 2014-01-23 MED ORDER — NYSTATIN 100000 UNIT/GM EX POWD: 1.0000 | Freq: Two times a day (BID) | CUTANEOUS | Status: DC

## 2014-01-23 NOTE — Progress Notes (Signed)
Subjective:   Patient ID: [name] female   DOB: Jan 13, 1961 53 y.o.   MRN: [id number]  HPI: [name] is a 53 y.o. with PMH as mentioned below comes to the office for the following.  1. Requesting a referral for podiatry. 2. Requesting refill on nystatin power and cream.  She denies any other complaints.  Past Medical History  Diagnosis Date  . Hypertension   . Hyperlipidemia   . GERD (gastroesophageal reflux disease)   . Adhesive capsulitis of shoulder     bilateral, Steroid injection Dr. Truman Hayward 1/12 bilaterally  . Peripheral neuropathy   . Adhesive capsulitis of right shoulder     Steroid injection Dr. Truman Hayward 1/12  . Adhesive capsulitis of left shoulder     Steroid injection Dr. Truman Hayward 1/12  . Obesity   . Diabetic retinopathy   . Glaucoma   . Diabetes mellitus     Type II, insulin dependent  . CAD (coronary artery disease)     nonobstructive. Last cardiac cath (2008) showing left circumflex with mid 50% stenosis and distal luminal irregularities. Also with RCA with mid to distal 30-40% stenosis. // Previously evaluated by [facility], never followed up outpatient.  . CHF (congestive heart failure)   . CAP (community acquired pneumonia) 06/15/2012    05/2012 CXR: Mild opacification of the posterior lung base on the lateral film  as cannot exclude infection/atelectasis.     Current Outpatient Prescriptions  Medication Sig Dispense Refill  . amitriptyline (ELAVIL) 25 MG tablet Take 1 tablet (25 mg total) by mouth at bedtime.  30 tablet  2  . aspirin EC 325 MG EC tablet Take 1 tablet (325 mg total) by mouth daily.  30 tablet  0  . carvedilol (COREG) 6.25 MG tablet Take 1 tablet (6.25 mg total) by mouth 2 (two) times daily.  60 tablet  11  . Cetirizine HCl (ZYRTEC ALLERGY) 10 MG CAPS Take 1 capsule (10 mg total) by mouth 2 (two) times daily as needed (itching).  14 capsule  2  . diazepam (VALIUM) 5 MG tablet Take 1 tablet (5 mg total) by mouth every 8 (eight)  hours as needed for muscle spasms.  20 tablet  0  . dorzolamide-timolol (COSOPT) 22.3-6.8 MG/ML ophthalmic solution Place 1 drop into the right eye 2 (two) times daily.      . ferrous sulfate 325 (65 FE) MG tablet Take 1 tablet (325 mg total) by mouth daily with breakfast.  30 tablet  11  . fluconazole (DIFLUCAN) 100 MG tablet Take 1 tablet (100 mg total) by mouth daily.  7 tablet  0  . furosemide (LASIX) 40 MG tablet Take 20 mg by mouth daily.      Marland Kitchen gabapentin (NEURONTIN) 300 MG capsule Take 1 capsule (300 mg total) by mouth 2 (two) times daily.  60 capsule  3  . HYDROcodone-acetaminophen (NORCO/VICODIN) 5-325 MG per tablet Take 1-2 tablets by mouth every 6 (six) hours as needed for moderate pain.  90 tablet  0  . hydrOXYzine (ATARAX/VISTARIL) 10 MG tablet Take 1 tablet (10 mg total) by mouth 3 (three) times daily as needed.  30 tablet  0  . insulin aspart (NOVOLOG FLEXPEN) 100 UNIT/ML FlexPen Inject 4-6 Units into the skin 3 (three) times daily before meals. 4 units in the morning and 6 units in the evening  15 mL  1  . Insulin Glargine (LANTUS SOLOSTAR) 100 UNIT/ML Solostar Pen Inject 44 units under the skin at bedtime  30 mL  1  . Insulin Pen Needle (B-D UF III MINI PEN NEEDLES) 31G X 5 MM MISC Use to inject insulin 4-5 times daily  200 each  5  . lisinopril (PRINIVIL,ZESTRIL) 20 MG tablet TAKE HALF TABLET BY MOUTH DAILY  90 tablet  0  . metFORMIN (GLUCOPHAGE) 1000 MG tablet TAKE 1 TABLET BY MOUTH TWICE DAILY WITH MEALS  180 tablet  0  . nystatin (MYCOSTATIN/NYSTOP) 100000 UNIT/GM POWD Apply 1 Bottle topically 2 (two) times daily.  60 g  0  . nystatin cream (MYCOSTATIN) Apply 1 application topically 2 (two) times daily.  30 g  0  . omeprazole (PRILOSEC) 20 MG capsule TAKE ONE CAPSULE BY MOUTH EVERY DAY  30 capsule  0  . ondansetron (ZOFRAN ODT) 4 MG disintegrating tablet Take 1 tablet (4 mg total) by mouth every 8 (eight) hours as needed for nausea or vomiting.  20 tablet  0  . pravastatin  (PRAVACHOL) 20 MG tablet Take 1 tablet (20 mg total) by mouth daily.  30 tablet  2  . promethazine (PHENERGAN) 12.5 MG tablet TAKE 1 TABLET BY MOUTH EVERY 6 HOURS AS NEEDED FOR NAUSEA OR VOMITING  30 tablet  0   No current facility-administered medications for this visit.   Family History  Problem Relation Age of Onset  . Hypertension Sister   . Hypertension Sister   . Hypertension Sister    History   Social History  . Marital Status: Single    Spouse Name: N/A    Number of Children: 2  . Years of Education: 12th grade   Occupational History  . 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    Social History Main Topics  . Smoking status: Former Smoker -- 0.30 packs/day for 7 years    Types: Cigarettes    Quit date: 06/27/2006  . Smokeless tobacco: Never Used  . Alcohol Use: No  . Drug Use: No  . Sexual Activity: Not Currently   Other Topics Concern  . Not on file   Social History Narrative   Lives with her kids, 46yo daughter and 5 yo adopted son.   Review of Systems: Pertinent items are noted in HPI. Objective:  Physical Exam: There were no vitals filed for this visit.  Assessment & Plan:

## 2014-01-23 NOTE — Assessment & Plan Note (Signed)
Patient missed appointments with podiatry and is requesting another referral.  Plans: Podiatry referral made with an appointment on august 6th, 2015 at 10:30 am. Recommended her not to miss the appointment.

## 2014-01-23 NOTE — Patient Instructions (Signed)
Follow up with your podiatry on august 6th, 2015 at 10:30 AM. Take all the medications as recommended below.

## 2014-01-27 NOTE — Progress Notes (Signed)
Case discussed with Dr. Eyvonne Mechanic soon after the resident saw the patient.  We reviewed the resident's history and exam and pertinent patient test results.  I agree with the assessment, diagnosis, and plan of care documented in the resident's note.

## 2014-01-27 NOTE — Addendum Note (Signed)
Addended by: Gilles Chiquito B on: 01/27/2014 09:20 AM   Modules accepted: Level of Service

## 2014-01-30 ENCOUNTER — Other Ambulatory Visit: Payer: Self-pay | Admitting: Internal Medicine

## 2014-02-04 ENCOUNTER — Other Ambulatory Visit: Payer: Self-pay | Admitting: Internal Medicine

## 2014-02-05 ENCOUNTER — Encounter: Payer: Self-pay | Admitting: Internal Medicine

## 2014-02-05 ENCOUNTER — Ambulatory Visit (INDEPENDENT_AMBULATORY_CARE_PROVIDER_SITE_OTHER): Payer: Medicare Other | Admitting: Internal Medicine

## 2014-02-05 VITALS — BP 139/76 | HR 96 | Temp 97.9°F | Ht 66.0 in | Wt 251.7 lb

## 2014-02-05 DIAGNOSIS — B372 Candidiasis of skin and nail: Secondary | ICD-10-CM

## 2014-02-05 DIAGNOSIS — E1149 Type 2 diabetes mellitus with other diabetic neurological complication: Secondary | ICD-10-CM

## 2014-02-05 DIAGNOSIS — E1165 Type 2 diabetes mellitus with hyperglycemia: Secondary | ICD-10-CM

## 2014-02-05 DIAGNOSIS — E1140 Type 2 diabetes mellitus with diabetic neuropathy, unspecified: Secondary | ICD-10-CM

## 2014-02-05 DIAGNOSIS — IMO0002 Reserved for concepts with insufficient information to code with codable children: Secondary | ICD-10-CM

## 2014-02-05 DIAGNOSIS — Z1211 Encounter for screening for malignant neoplasm of colon: Secondary | ICD-10-CM

## 2014-02-05 DIAGNOSIS — E1142 Type 2 diabetes mellitus with diabetic polyneuropathy: Secondary | ICD-10-CM

## 2014-02-05 DIAGNOSIS — R21 Rash and other nonspecific skin eruption: Secondary | ICD-10-CM

## 2014-02-05 LAB — GLUCOSE, CAPILLARY: GLUCOSE-CAPILLARY: 93 mg/dL (ref 70–99)

## 2014-02-05 LAB — POCT GLYCOSYLATED HEMOGLOBIN (HGB A1C): Hemoglobin A1C: 8.4

## 2014-02-05 MED ORDER — KETOCONAZOLE 2 % EX CREA: 1.0000 "application " | TOPICAL_CREAM | Freq: Every day | CUTANEOUS | Status: DC

## 2014-02-05 MED ORDER — FLUCONAZOLE 50 MG PO TABS: 50.0000 mg | ORAL_TABLET | Freq: Every day | ORAL | Status: DC

## 2014-02-05 NOTE — Progress Notes (Addendum)
Patient ID: [name], female   DOB: 1961-01-06, 53 y.o.   MRN: [id number]  Patient has a pruritic rash on her belly below her umblicus. It is irregular in shape, black-brown in color, has variegated margins, raised, and itchy as I can see a lot of scratch marks around it. The patient also complains of itching on her lower back and arms, where I can see minute brownish 61mm or smaller pimple like dots, which appear to be raised. I am not sure what this is, since the patient has had no benefit from steroids, or nystatin, and had some benefit from fluconazole, I wonder if this is a dermatophytic infection - although it does not have the typical appearance. It does not look like intertrigo/yeast to me. I discussed with Dr Gordy Levan, and we decided to give her a longer course of fluconazole and clotrimazole ointment to apply (since she has not tried it before). We will also arrange for a Derm referral.   I saw and evaluated the patient.  I personally confirmed the key portions of the history and exam documented by Dr. Gordy Levan and I reviewed pertinent patient test results.  The assessment, diagnosis, and plan were formulated together and I agree with the documentation in the resident's note.

## 2014-02-05 NOTE — Assessment & Plan Note (Signed)
-  check HA1c  

## 2014-02-05 NOTE — Assessment & Plan Note (Addendum)
Pt p/w pruritic, patchy, scaly rash located primarily below her pannus and in the folds of her groin area.  Also reports rash on arms bilaterally but I do not appreciate any lesions.  No excoriations. Has been treated over the past several weeks with nystatin cream/powder, fluconazole for 7 days, and hydroxyzine.  She reports the rash is about the same perhaps somewhat improved with there course of fluconazole.  Reports using gain detergent and scented lotion.  She is frustrated because the itching is keeping her up at night.   -begin ketoconazole cream daily to the areas until gone  -fluconazole 50mg  daily x 10 days (may need to increase duration) -take warm not hot showers -try to keep the areas as dry as possible which may be difficult in the heat  -use creams such as cetaphil, eucerin, cerave on areas of dryness instead of scented varieties -would use ALL free and clear detergent instead of gain  -referral to dermatology  -continue hydroxyzine for itching and to help with sleep  -follow up with PCP if not improving

## 2014-02-05 NOTE — Patient Instructions (Addendum)
Thank you for your visit today.   Please return to the internal medicine clinic into see your PCP in 3 months.     Apply ketoconazole cream once daily to the affected area until gone. I have also prescribed an oral antifungal to be taken once daily (fluconazole).    Your current medical regimen is effective;  continue present plan and take all medications as prescribed.    I have sent your medication refills to your pharmacy.   I have made the following referrals for you: dermatology and colonoscopy.   Please be sure to bring all of your medications with you to every visit; this includes herbal supplements, vitamins, eye drops, and any over-the-counter medications.   Should you have any questions regarding your medications and/or any new or worsening symptoms, please be sure to call the clinic at [phone].   If you believe that you are suffering from a life threatening condition or one that may result in the loss of limb or function, then you should call 911 or proceed to the nearest Emergency Department.     Intertrigo Intertrigo is a skin condition that occurs in between folds of skin in places on the body that rub together a lot and do not get much ventilation. It is caused by heat, moisture, friction, sweat retention, and lack of air circulation, which produces red, irritated patches and, sometimes, scaling or drainage. People who have diabetes, who are obese, or who have treatment with antibiotics are at increased risk for intertrigo. The most common sites for intertrigo to occur include:  The groin.  The breasts.  The armpits.  Folds of abdominal skin.  Webbed spaces between the fingers or toes. Intertrigo may be aggravated by:  Sweat.  Feces.  Yeast or bacteria that are present near skin folds.  Urine.  Vaginal discharge. HOME CARE INSTRUCTIONS  The following steps can be taken to reduce friction and keep the affected area cool and dry:  Expose skin folds  to the air.  Keep deep skin folds separated with cotton or linen cloth. Avoid tight fitting clothing that could cause chafing.  Wear open-toed shoes or sandals to help reduce moisture between the toes.  Apply absorbent powders to affected areas as directed by your caregiver.  Apply over-the-counter barrier pastes, such as zinc oxide, as directed by your caregiver.  If you develop a fungal infection in the affected area, your caregiver may have you use antifungal creams. SEEK MEDICAL CARE IF:   The rash is not improving after 1 week of treatment.  The rash is getting worse (more red, more swollen, more painful, or spreading).  You have a fever or chills. MAKE SURE YOU:   Understand these instructions.  Will watch your condition.  Will get help right away if you are not doing well or get worse. Document Released: 06/13/2005 Document Revised: 09/05/2011 Document Reviewed: 11/26/2009 [facility] Patient Information 2015 [place], [place]. This information is not intended to replace advice given to you by your health care provider. Make sure you discuss any questions you have with your health care provider.    A healthy lifestyle and preventative care can promote health and wellness.   Maintain regular health, dental, and eye exams.  Eat a healthy diet. Foods like vegetables, fruits, whole grains, low-fat dairy products, and lean protein foods contain the nutrients you need without too many calories. Decrease your intake of foods high in solid fats, added sugars, and salt. Get information about a proper diet from  your caregiver, if necessary.  Regular physical exercise is one of the most important things you can do for your health. Most adults should get at least 150 minutes of moderate-intensity exercise (any activity that increases your heart rate and causes you to sweat) each week. In addition, most adults need muscle-strengthening exercises on 2 or more days a week.   Maintain a  healthy weight. The body mass index (BMI) is a screening tool to identify possible weight problems. It provides an estimate of body fat based on height and weight. Your caregiver can help determine your BMI, and can help you achieve or maintain a healthy weight. For adults 20 years and older:  A BMI below 18.5 is considered underweight.  A BMI of 18.5 to 24.9 is normal.  A BMI of 25 to 29.9 is considered overweight.  A BMI of 30 and above is considered obese.

## 2014-02-05 NOTE — Addendum Note (Signed)
Addended by: Truddie Crumble on: 02/05/2014 10:59 AM   Modules accepted: Orders

## 2014-02-05 NOTE — Progress Notes (Signed)
Patient ID: [name], female   DOB: [birth year], 53 y.o.   MRN: [id number]   Subjective:   Patient ID: [name] female    DOB: [birth year] 53 y.o.    MRN: [id number] Health Maintenance Due: Health Maintenance Due  Topic Date Due  . Colonoscopy  12/28/2008  . Foot Exam  05/10/2013  . Hemoglobin A1c  11/19/2013  . Influenza Vaccine  01/25/2014    ________________________________________________________________  HPI: [name] is a 53 y.o. female here for a routine visit.  Pt has a PMH outlined below.  Please see problem-based charting assessment and plan note for further details of medical issues addressed at today's visit.  PMH: Past Medical History  Diagnosis Date  . Hypertension   . Hyperlipidemia   . GERD (gastroesophageal reflux disease)   . Adhesive capsulitis of shoulder     bilateral, Steroid injection Dr. Truman Hayward 1/12 bilaterally  . Peripheral neuropathy   . Adhesive capsulitis of right shoulder     Steroid injection Dr. Truman Hayward 1/12  . Adhesive capsulitis of left shoulder     Steroid injection Dr. Truman Hayward 1/12  . Obesity   . Diabetic retinopathy   . Glaucoma   . Diabetes mellitus     Type II, insulin dependent  . CAD (coronary artery disease)     nonobstructive. Last cardiac cath (2008) showing left circumflex with mid 50% stenosis and distal luminal irregularities. Also with RCA with mid to distal 30-40% stenosis. // Previously evaluated by [facility], never followed up outpatient.  . CHF (congestive heart failure)   . CAP (community acquired pneumonia) 06/15/2012    05/2012 CXR: Mild opacification of the posterior lung base on the lateral film  as cannot exclude infection/atelectasis.      Medications: Current Outpatient Prescriptions on File Prior to Visit  Medication Sig Dispense Refill  . aspirin EC 325 MG EC tablet Take 1 tablet (325 mg total) by mouth daily.  30 tablet  0  . carvedilol (COREG) 6.25 MG tablet Take 1 tablet (6.25 mg total)  by mouth 2 (two) times daily.  60 tablet  11  . dorzolamide-timolol (COSOPT) 22.3-6.8 MG/ML ophthalmic solution Place 1 drop into the right eye 2 (two) times daily.      . ferrous sulfate 325 (65 FE) MG tablet Take 1 tablet (325 mg total) by mouth daily with breakfast.  30 tablet  11  . furosemide (LASIX) 40 MG tablet Take 20 mg by mouth daily.      Marland Kitchen gabapentin (NEURONTIN) 300 MG capsule Take 1 capsule (300 mg total) by mouth 2 (two) times daily.  60 capsule  3  . HYDROcodone-acetaminophen (NORCO/VICODIN) 5-325 MG per tablet Take 1-2 tablets by mouth every 6 (six) hours as needed for moderate pain.  90 tablet  0  . insulin aspart (NOVOLOG FLEXPEN) 100 UNIT/ML FlexPen Inject 4-6 Units into the skin 3 (three) times daily before meals. 4 units in the morning and 6 units in the evening  15 mL  1  . Insulin Glargine (LANTUS SOLOSTAR) 100 UNIT/ML Solostar Pen Inject 44 units under the skin at bedtime  30 mL  1  . Insulin Pen Needle (B-D UF III MINI PEN NEEDLES) 31G X 5 MM MISC Use to inject insulin 4-5 times daily  200 each  5  . lisinopril (PRINIVIL,ZESTRIL) 20 MG tablet TAKE HALF TABLET BY MOUTH DAILY  90 tablet  0  . metFORMIN (GLUCOPHAGE) 1000 MG tablet TAKE 1 TABLET BY MOUTH TWICE  DAILY WITH MEALS  180 tablet  0  . omeprazole (PRILOSEC) 20 MG capsule TAKE ONE CAPSULE BY MOUTH EVERY DAY  30 capsule  0  . amitriptyline (ELAVIL) 25 MG tablet TAKE 1 TABLET BY MOUTH AT BEDTIME  30 tablet  0  . Cetirizine HCl (ZYRTEC ALLERGY) 10 MG CAPS Take 1 capsule (10 mg total) by mouth 2 (two) times daily as needed (itching).  14 capsule  2  . diazepam (VALIUM) 5 MG tablet Take 1 tablet (5 mg total) by mouth every 8 (eight) hours as needed for muscle spasms.  20 tablet  0  . hydrOXYzine (ATARAX/VISTARIL) 10 MG tablet Take 1 tablet (10 mg total) by mouth 3 (three) times daily as needed.  30 tablet  0  . nystatin (MYCOSTATIN/NYSTOP) 100000 UNIT/GM POWD Apply 1 Bottle topically 2 (two) times daily.  60 g  0  . nystatin  cream (MYCOSTATIN) Apply 1 application topically 2 (two) times daily.  30 g  0  . ondansetron (ZOFRAN ODT) 4 MG disintegrating tablet Take 1 tablet (4 mg total) by mouth every 8 (eight) hours as needed for nausea or vomiting.  20 tablet  0  . pravastatin (PRAVACHOL) 20 MG tablet TAKE 1 TABLET BY MOUTH EVERY DAY  30 tablet  0  . promethazine (PHENERGAN) 12.5 MG tablet TAKE 1 TABLET BY MOUTH EVERY 6 HOURS AS NEEDED FOR NAUSEA OR VOMITING  30 tablet  0   No current facility-administered medications on file prior to visit.    Allergies: No Known Allergies  FH: Family History  Problem Relation Age of Onset  . Hypertension Sister   . Hypertension Sister   . Hypertension Sister     SH: History   Social History  . Marital Status: Single    Spouse Name: N/A    Number of Children: 2  . Years of Education: 12th grade   Occupational History  . 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    Social History Main Topics  . Smoking status: Former Smoker -- 0.30 packs/day for 7 years    Types: Cigarettes    Quit date: 06/27/2006  . Smokeless tobacco: Never Used  . Alcohol Use: No  . Drug Use: No  . Sexual Activity: Not Currently   Other Topics Concern  . None   Social History Narrative   Lives with her kids, 66yo daughter and 67 yo adopted son.    Review of Systems: Constitutional: Negative for fever, chills and weight loss.  Eyes: Negative for blurred vision.  Respiratory: Negative for cough and shortness of breath.  Cardiovascular: Negative for chest pain, palpitations and leg swelling.  Gastrointestinal: Negative for nausea, vomiting, abdominal pain, diarrhea, constipation and blood in stool.  Genitourinary: Negative for dysuria, urgency and frequency.  Musculoskeletal: Negative for myalgias and back pain.  Neurological: Negative for dizziness, weakness and headaches.     Objective:   Vital Signs: Filed Vitals:    02/05/14 0913  BP: 139/76  Pulse: 96  Temp: 97.9 F (36.6 C)  TempSrc: Oral  Height: 5\' 6"  (1.676 m)  Weight: 251 lb 11.2 oz (114.17 kg)  SpO2: 99%      BP Readings from Last 3 Encounters:  02/05/14 139/76  01/01/14 130/73  12/19/13 173/89    Physical Exam: Constitutional: Vital signs reviewed.  Patient is well-developed and well-nourished in NAD and cooperative with exam.  Head: Normocephalic and atraumatic. Eyes: PERRL, EOMI,  conjunctivae nl, no scleral icterus.  Neck: Supple. Cardiovascular: RRR, no MRG. Pulmonary/Chest: normal effort, non-tender to palpation, CTAB, no wheezes, rales, or rhonchi. Abdominal: Obese. Soft. NT/ND +BS. Neurological: A&O x3, cranial nerves II-XII are grossly intact, moving all extremities. Extremities: 2+DP b/l; no pitting edema. Skin: Warm, dry and intact. Patchy, scaly, pruritic rash located in the intertrigo area mainly and the skin folds of the groin.  Also has a small patch on her lower back.    Most Recent Laboratory Results:  CMP     Component Value Date/Time   NA 141 07/22/2013 1102   K 4.5 07/22/2013 1102   CL 101 07/22/2013 1102   CO2 26 07/22/2013 1102   GLUCOSE 178* 07/22/2013 1102   BUN 22 07/22/2013 1102   CREATININE 1.01 07/22/2013 1102   CREATININE 0.92 12/05/2012 0858   CALCIUM 9.6 07/22/2013 1102   PROT 8.1 07/22/2013 1102   ALBUMIN 3.9 07/22/2013 1102   AST 15 07/22/2013 1102   ALT 14 07/22/2013 1102   ALKPHOS 80 07/22/2013 1102   BILITOT 0.2* 07/22/2013 1102   GFRNONAA 62* 07/22/2013 1102   GFRNONAA 71 12/05/2012 0858   GFRAA 72* 07/22/2013 1102   GFRAA 82 12/05/2012 0858    CBC    Component Value Date/Time   WBC 10.1 07/22/2013 1102   RBC 4.22 07/22/2013 1102   RBC 3.88 10/28/2011 2330   HGB 11.7* 07/22/2013 1102   HCT 35.9* 07/22/2013 1102   PLT 279 07/22/2013 1102   MCV 85.1 07/22/2013 1102   MCH 27.7 07/22/2013 1102   MCHC 32.6 07/22/2013 1102   RDW 14.2 07/22/2013 1102   LYMPHSABS 3.9 02/09/2011 1033   MONOABS 0.8  02/09/2011 1033   EOSABS 0.2 02/09/2011 1033   BASOSABS 0.0 02/09/2011 1033    Lipid Panel Lab Results  Component Value Date   CHOL 182 10/01/2013   HDL 57 10/01/2013   LDLCALC 100* 10/01/2013   TRIG 123 10/01/2013   CHOLHDL 3.2 10/01/2013    HA1C Lab Results  Component Value Date   HGBA1C 8.4 02/05/2014    Urinalysis    Component Value Date/Time   COLORURINE YELLOW 07/22/2013 1154   APPEARANCEUR CLEAR 07/22/2013 1154   LABSPEC 1.011 07/22/2013 1154   PHURINE 5.0 07/22/2013 1154   GLUCOSEU NEGATIVE 07/22/2013 1154   GLUCOSEU 100* 01/15/2009 2113   HGBUR NEGATIVE 07/22/2013 1154   BILIRUBINUR Negative 10/01/2013 1040   BILIRUBINUR NEGATIVE 07/22/2013 1154   KETONESUR NEGATIVE 07/22/2013 1154   PROTEINUR 30 10/01/2013 1040   PROTEINUR NEGATIVE 07/22/2013 1154   UROBILINOGEN 0.2 10/01/2013 1040   UROBILINOGEN 0.2 07/22/2013 1154   NITRITE Positive 10/01/2013 1040   NITRITE NEGATIVE 07/22/2013 1154   LEUKOCYTESUR small (1+) 10/01/2013 1040    Urine Microalbumin Lab Results  Component Value Date   MICROALBUR 2.35* 03/08/2013    Imaging N/A   Assessment & Plan:   Assessment and plan was discussed and formulated with my attending.

## 2014-02-06 ENCOUNTER — Other Ambulatory Visit: Payer: Self-pay | Admitting: Internal Medicine

## 2014-02-07 ENCOUNTER — Telehealth: Payer: Self-pay | Admitting: *Deleted

## 2014-02-07 ENCOUNTER — Other Ambulatory Visit: Payer: Self-pay | Admitting: *Deleted

## 2014-02-07 NOTE — Telephone Encounter (Signed)
[facility] called and Diflucan 50 mg is on backorder.  I called pharmacy and they have 100 mg tabs, they are not scored but can be cut in half. Please resend

## 2014-02-10 MED ORDER — FLUCONAZOLE 100 MG PO TABS: 50.0000 mg | ORAL_TABLET | Freq: Every day | ORAL | Status: DC

## 2014-02-10 MED ORDER — FUROSEMIDE 40 MG PO TABS: 20.0000 mg | ORAL_TABLET | Freq: Every day | ORAL | Status: DC

## 2014-02-18 ENCOUNTER — Encounter (HOSPITAL_COMMUNITY): Payer: Self-pay | Admitting: Emergency Medicine

## 2014-02-18 ENCOUNTER — Emergency Department (HOSPITAL_COMMUNITY)
Admission: EM | Admit: 2014-02-18 | Discharge: 2014-02-18 | Disposition: A | Discharge status: Home / Self Care | Payer: Medicare Other | Attending: Emergency Medicine | Admitting: Emergency Medicine

## 2014-02-18 DIAGNOSIS — M7500 Adhesive capsulitis of unspecified shoulder: Secondary | ICD-10-CM | POA: Diagnosis not present

## 2014-02-18 DIAGNOSIS — E1139 Type 2 diabetes mellitus with other diabetic ophthalmic complication: Secondary | ICD-10-CM | POA: Diagnosis not present

## 2014-02-18 DIAGNOSIS — M545 Low back pain, unspecified: Secondary | ICD-10-CM | POA: Insufficient documentation

## 2014-02-18 DIAGNOSIS — W57XXXA Bitten or stung by nonvenomous insect and other nonvenomous arthropods, initial encounter: Secondary | ICD-10-CM

## 2014-02-18 DIAGNOSIS — Z8719 Personal history of other diseases of the digestive system: Secondary | ICD-10-CM | POA: Insufficient documentation

## 2014-02-18 DIAGNOSIS — Z9889 Other specified postprocedural states: Secondary | ICD-10-CM | POA: Diagnosis not present

## 2014-02-18 DIAGNOSIS — I10 Essential (primary) hypertension: Secondary | ICD-10-CM | POA: Insufficient documentation

## 2014-02-18 DIAGNOSIS — E785 Hyperlipidemia, unspecified: Secondary | ICD-10-CM | POA: Insufficient documentation

## 2014-02-18 DIAGNOSIS — E11319 Type 2 diabetes mellitus with unspecified diabetic retinopathy without macular edema: Secondary | ICD-10-CM | POA: Insufficient documentation

## 2014-02-18 DIAGNOSIS — Z8669 Personal history of other diseases of the nervous system and sense organs: Secondary | ICD-10-CM | POA: Insufficient documentation

## 2014-02-18 DIAGNOSIS — Z794 Long term (current) use of insulin: Secondary | ICD-10-CM | POA: Insufficient documentation

## 2014-02-18 DIAGNOSIS — Z87891 Personal history of nicotine dependence: Secondary | ICD-10-CM | POA: Insufficient documentation

## 2014-02-18 DIAGNOSIS — I2510 Atherosclerotic heart disease of native coronary artery without angina pectoris: Secondary | ICD-10-CM | POA: Diagnosis not present

## 2014-02-18 DIAGNOSIS — I509 Heart failure, unspecified: Secondary | ICD-10-CM | POA: Insufficient documentation

## 2014-02-18 DIAGNOSIS — H409 Unspecified glaucoma: Secondary | ICD-10-CM | POA: Diagnosis not present

## 2014-02-18 DIAGNOSIS — Z7982 Long term (current) use of aspirin: Secondary | ICD-10-CM | POA: Diagnosis not present

## 2014-02-18 DIAGNOSIS — Z8701 Personal history of pneumonia (recurrent): Secondary | ICD-10-CM | POA: Insufficient documentation

## 2014-02-18 DIAGNOSIS — E669 Obesity, unspecified: Secondary | ICD-10-CM | POA: Diagnosis not present

## 2014-02-18 MED ORDER — HYDROCODONE-ACETAMINOPHEN 5-325 MG PO TABS: 1.0000 | ORAL_TABLET | Freq: Four times a day (QID) | ORAL | Status: DC | PRN

## 2014-02-18 MED ORDER — PERMETHRIN 5 % EX CREA: TOPICAL_CREAM | CUTANEOUS | Status: DC

## 2014-02-18 MED ORDER — HYDROCODONE-ACETAMINOPHEN 5-325 MG PO TABS
2.0000 | ORAL_TABLET | Freq: Once | ORAL | Status: AC
  Administered 2014-02-18: 2 via ORAL
  Filled 2014-02-18: qty 2

## 2014-02-18 NOTE — ED Notes (Signed)
Patient was able to ambulate on her own.

## 2014-02-18 NOTE — ED Provider Notes (Signed)
CSN: [id number]     Arrival date & time 02/18/14  0910 History  This chart was scribed for non-physician practitioner Montine Circle, PA-C working with Veryl Speak, MD by Ludger Nutting, ED Scribe. This patient was seen in room E43C/E43C and the patient's care was started at 9:58 AM.    Chief Complaint  Patient presents with  . Back Pain    The history is provided by the patient. No language interpreter was used.    HPI Comments: [name] is a 53 y.o. female with past medical history of HTN, HLD, CHF, CAD who presents to the Emergency Department complaining of 1 week of constant, gradually worsened bilateral lower back pain that occasionally radiates to the lower extremities. Patient states the pain is worse in the morning and states she had difficulty getting up out of bed due to pain. She states the pain is aggravated with walking and other activity. She denies recent falls, injuries, or trauma to the affected area. She has not taken any OTC medications for her symptoms. She denies dysuria, hematuria, fever, nausea, vomiting, diarrhea, constipation.   Patient also complains of a constant, unchanged rash to the bilateral arms and torso for the past couple of weeks. She has been seen by an outpatient clinic without relief of symptoms.   Past Medical History  Diagnosis Date  . Hypertension   . Hyperlipidemia   . GERD (gastroesophageal reflux disease)   . Adhesive capsulitis of shoulder     bilateral, Steroid injection Dr. Truman Hayward 1/12 bilaterally  . Peripheral neuropathy   . Adhesive capsulitis of right shoulder     Steroid injection Dr. Truman Hayward 1/12  . Adhesive capsulitis of left shoulder     Steroid injection Dr. Truman Hayward 1/12  . Obesity   . Diabetic retinopathy   . Glaucoma   . Diabetes mellitus     Type II, insulin dependent  . CAD (coronary artery disease)     nonobstructive. Last cardiac cath (2008) showing left circumflex with mid 50% stenosis and distal luminal irregularities. Also  with RCA with mid to distal 30-40% stenosis. // Previously evaluated by [facility], never followed up outpatient.  . CHF (congestive heart failure)   . CAP (community acquired pneumonia) 06/15/2012    05/2012 CXR: Mild opacification of the posterior lung base on the lateral film  as cannot exclude infection/atelectasis.     Past Surgical History  Procedure Laterality Date  . Cataract extraction    . Glaucoma surgery    . Cardiac catheterization    . Tubal ligation     Family History  Problem Relation Age of Onset  . Hypertension Sister   . Hypertension Sister   . Hypertension Sister    History  Substance Use Topics  . Smoking status: Former Smoker -- 0.30 packs/day for 7 years    Types: Cigarettes    Quit date: 06/27/2006  . Smokeless tobacco: Never Used  . Alcohol Use: No   OB History   Grav Para Term Preterm Abortions TAB SAB Ect Mult Living                 Review of Systems  Constitutional: Negative for fever and chills.  Gastrointestinal:       No bowel incontinence  Genitourinary: Negative for dysuria and hematuria.       No urinary incontinence  Musculoskeletal: Positive for arthralgias, back pain and myalgias. Negative for neck pain.  Skin: Positive for rash.  Neurological: Negative for weakness and  numbness.       No saddle anesthesia  All other systems reviewed and are negative.     Allergies  Review of patient's allergies indicates no known allergies.  Home Medications   Prior to Admission medications   Medication Sig Start Date End Date Taking? Authorizing Provider  amitriptyline (ELAVIL) 25 MG tablet Take 25 mg by mouth at bedtime.   Yes Historical Provider, MD  aspirin EC 325 MG EC tablet Take 1 tablet (325 mg total) by mouth daily. 08/23/13  Yes Erlene Senters, PA-C  carvedilol (COREG) 6.25 MG tablet Take 1 tablet (6.25 mg total) by mouth 2 (two) times daily. 03/11/13  Yes Sherren Mocha, MD  dorzolamide-timolol (COSOPT) 22.3-6.8 MG/ML  ophthalmic solution Place 1 drop into the right eye 2 (two) times daily.   Yes Historical Provider, MD  ferrous sulfate 325 (65 FE) MG tablet Take 1 tablet (325 mg total) by mouth daily with breakfast. 06/15/12  Yes Valaria Good, MD  furosemide (LASIX) 40 MG tablet Take 0.5 tablets (20 mg total) by mouth daily. 02/10/14  Yes Nischal Dareen Piano, MD  gabapentin (NEURONTIN) 300 MG capsule Take 1 capsule (300 mg total) by mouth 2 (two) times daily. 10/10/13  Yes Valaria Good, MD  insulin aspart (NOVOLOG FLEXPEN) 100 UNIT/ML FlexPen Inject 4-6 Units into the skin 3 (three) times daily before meals. 4 units in the morning and 6 units in the evening   Yes Madilyn Fireman, MD  Insulin Glargine (LANTUS SOLOSTAR) 100 UNIT/ML Solostar Pen Inject 44 Units into the skin at bedtime.   Yes Historical Provider, MD  lisinopril (PRINIVIL,ZESTRIL) 20 MG tablet Take 10 mg by mouth daily.   Yes Historical Provider, MD  metFORMIN (GLUCOPHAGE) 1000 MG tablet Take 1,000 mg by mouth 2 (two) times daily with a meal.   Yes Historical Provider, MD  pravastatin (PRAVACHOL) 20 MG tablet Take 20 mg by mouth daily.   Yes Historical Provider, MD  Insulin Pen Needle (B-D UF III MINI PEN NEEDLES) 31G X 5 MM MISC Use to inject insulin 4-5 times daily    Madilyn Fireman, MD   BP 119/65  Pulse 96  Temp(Src) 97.9 F (36.6 C) (Oral)  Resp 16  SpO2 100%  LMP 02/13/2011 Physical Exam  Nursing note and vitals reviewed. Constitutional: She is oriented to person, place, and time. She appears well-developed and well-nourished. No distress.  HENT:  Head: Normocephalic and atraumatic.  Eyes: Conjunctivae and EOM are normal. Right eye exhibits no discharge. Left eye exhibits no discharge. No scleral icterus.  Neck: Normal range of motion. Neck supple. No tracheal deviation present.  Cardiovascular: Normal rate, regular rhythm and normal heart sounds.  Exam reveals no gallop and no friction rub.   No murmur heard. Pulmonary/Chest:  Effort normal and breath sounds normal. No respiratory distress. She has no wheezes.  Abdominal: Soft. She exhibits no distension. There is no tenderness.  No focal abdominal tenderness, no RLQ tenderness or pain at McBurney's point, no RUQ tenderness or Murphy's sign, no left-sided abdominal tenderness, no fluid wave, or signs of peritonitis   Musculoskeletal: Normal range of motion.  Lumbar paraspinal muscles tender to palpation, no bony tenderness, step-offs, or gross abnormality or deformity of spine, patient is able to ambulate, moves all extremities  Bilateral great toe extension intact Bilateral plantar/dorsiflexion intact  Neurological: She is alert and oriented to person, place, and time. She has normal reflexes.  Sensation and strength intact bilaterally Symmetrical reflexes  Skin: Skin is warm  and dry. She is not diaphoretic.  Very mild maculopapular rash, possibly bug bites to trunk and arms  Psychiatric: She has a normal mood and affect. Her behavior is normal. Judgment and thought content normal.    ED Course  Procedures (including critical care time)  DIAGNOSTIC STUDIES: Oxygen Saturation is 100% on RA, normal by my interpretation.    COORDINATION OF CARE: 10:02 AM Will prescribe pain medication and muscle relaxants. Discussed treatment plan with pt at bedside and pt agreed to plan.   Labs Review Labs Reviewed - No data to display  Imaging Review No results found.   EKG Interpretation None      MDM   Final diagnoses:  Midline low back pain without sciatica  Insect bite     Patient with back pain.   Patient with back pain.  No neurological deficits and normal neuro exam.  Patient is ambulatory.  No loss of bowel or bladder control.  Doubt cauda equina.  Denies fever,  doubt epidural abscess or other lesion. Recommend back exercises, stretching, RICE, and will treat with a short course of norco.  Encouraged the patient that there could be a need for  additional workup and/or imaging such as MRI, if the symptoms do not resolve. Patient advised that if the back pain does not resolve, or radiates, this could progress to more serious conditions and is encouraged to follow-up with PCP or orthopedics within 2 weeks.    Also complains of rash.  Has been treated for candidiasis with minimal relief.  Patient uncertain if she is getting bit by bugs or not.  Will try some permethrin and recommend PCP follow-up.   I personally performed the services described in this documentation, which was scribed in my presence. The recorded information has been reviewed and is accurate.    Montine Circle, PA-C 02/18/14 1138

## 2014-02-18 NOTE — Discharge Instructions (Signed)
Back Pain, Adult Low back pain is very common. About 1 in 5 people have back pain.The cause of low back pain is rarely dangerous. The pain often gets better over time.About half of people with a sudden onset of back pain feel better in just 2 weeks. About 8 in 10 people feel better by 6 weeks.  CAUSES Some common causes of back pain include:  Strain of the muscles or ligaments supporting the spine.  Wear and tear (degeneration) of the spinal discs.  Arthritis.  Direct injury to the back. DIAGNOSIS Most of the time, the direct cause of low back pain is not known.However, back pain can be treated effectively even when the exact cause of the pain is unknown.Answering your caregiver's questions about your overall health and symptoms is one of the most accurate ways to make sure the cause of your pain is not dangerous. If your caregiver needs more information, he or she may order lab work or imaging tests (X-rays or MRIs).However, even if imaging tests show changes in your back, this usually does not require surgery. HOME CARE INSTRUCTIONS For many people, back pain returns.Since low back pain is rarely dangerous, it is often a condition that people can learn to manageon their own.   Remain active. It is stressful on the back to sit or stand in one place. Do not sit, drive, or stand in one place for more than 30 minutes at a time. Take short walks on level surfaces as soon as pain allows.Try to increase the length of time you walk each day.  Do not stay in bed.Resting more than 1 or 2 days can delay your recovery.  Do not avoid exercise or work.Your body is made to move.It is not dangerous to be active, even though your back may hurt.Your back will likely heal faster if you return to being active before your pain is gone.  Pay attention to your body when you bend and lift. Many people have less discomfortwhen lifting if they bend their knees, keep the load close to their bodies,and  avoid twisting. Often, the most comfortable positions are those that put less stress on your recovering back.  Find a comfortable position to sleep. Use a firm mattress and lie on your side with your knees slightly bent. If you lie on your back, put a pillow under your knees.  Only take over-the-counter or prescription medicines as directed by your caregiver. Over-the-counter medicines to reduce pain and inflammation are often the most helpful.Your caregiver may prescribe muscle relaxant drugs.These medicines help dull your pain so you can more quickly return to your normal activities and healthy exercise.  Put ice on the injured area.  Put ice in a plastic bag.  Place a towel between your skin and the bag.  Leave the ice on for 15-20 minutes, 03-04 times a day for the first 2 to 3 days. After that, ice and heat may be alternated to reduce pain and spasms.  Ask your caregiver about trying back exercises and gentle massage. This may be of some benefit.  Avoid feeling anxious or stressed.Stress increases muscle tension and can worsen back pain.It is important to recognize when you are anxious or stressed and learn ways to manage it.Exercise is a great option. SEEK MEDICAL CARE IF:  You have pain that is not relieved with rest or medicine.  You have pain that does not improve in 1 week.  You have new symptoms.  You are generally not feeling well. SEEK   IMMEDIATE MEDICAL CARE IF:   You have pain that radiates from your back into your legs.  You develop new bowel or bladder control problems.  You have unusual weakness or numbness in your arms or legs.  You develop nausea or vomiting.  You develop abdominal pain.  You feel faint. Document Released: 06/13/2005 Document Revised: 12/13/2011 Document Reviewed: 10/15/2013 Exit[employer] Patient Information 2015 [name]. This information is not intended to replace advice given to you by your health care provider. Make sure you  discuss any questions you have with your health care provider.  

## 2014-02-18 NOTE — ED Provider Notes (Signed)
Medical screening examination/treatment/procedure(s) were performed by non-physician practitioner and as supervising physician I was immediately available for consultation/collaboration.     Veryl Speak, MD 02/18/14 1251

## 2014-02-18 NOTE — ED Notes (Signed)
C/o back pain onset 1 week ago, states its hard for her to get going first thing in the am. Patient is a patient of outpatient clinic however hasn't called them.

## 2014-02-25 ENCOUNTER — Telehealth: Payer: Self-pay | Admitting: *Deleted

## 2014-02-25 NOTE — Telephone Encounter (Signed)
Pt called - needs refill on pain med given  to her at  ER recently - talked with pt and appt made in clinic 02/26/14 2:15PM Dr Deitra Mayo. [facility] suggest for pt to call dermatology - needs referral. Hilda Blades Haylyn Halberg RN 02/25/14 11:20AM

## 2014-02-26 ENCOUNTER — Ambulatory Visit (INDEPENDENT_AMBULATORY_CARE_PROVIDER_SITE_OTHER): Payer: Medicare Other | Admitting: Internal Medicine

## 2014-02-26 ENCOUNTER — Encounter: Payer: Self-pay | Admitting: Internal Medicine

## 2014-02-26 VITALS — BP 169/86 | HR 83 | Temp 97.8°F | Ht 66.0 in | Wt 251.9 lb

## 2014-02-26 DIAGNOSIS — IMO0002 Reserved for concepts with insufficient information to code with codable children: Secondary | ICD-10-CM

## 2014-02-26 DIAGNOSIS — E1140 Type 2 diabetes mellitus with diabetic neuropathy, unspecified: Secondary | ICD-10-CM

## 2014-02-26 DIAGNOSIS — M545 Low back pain, unspecified: Secondary | ICD-10-CM

## 2014-02-26 DIAGNOSIS — E1149 Type 2 diabetes mellitus with other diabetic neurological complication: Secondary | ICD-10-CM

## 2014-02-26 DIAGNOSIS — E1165 Type 2 diabetes mellitus with hyperglycemia: Secondary | ICD-10-CM

## 2014-02-26 DIAGNOSIS — E1142 Type 2 diabetes mellitus with diabetic polyneuropathy: Secondary | ICD-10-CM

## 2014-02-26 MED ORDER — ONETOUCH VERIO IQ SYSTEM W/DEVICE KIT: PACK | Status: DC

## 2014-02-26 MED ORDER — HYDROCODONE-ACETAMINOPHEN 5-325 MG PO TABS: 1.0000 | ORAL_TABLET | Freq: Four times a day (QID) | ORAL | Status: DC | PRN

## 2014-02-26 MED ORDER — ONETOUCH DELICA LANCETS FINE MISC: Status: DC

## 2014-02-26 MED ORDER — GLUCOSE BLOOD VI STRP: ORAL_STRIP | Status: DC

## 2014-02-26 MED ORDER — BACLOFEN 20 MG PO TABS: 20.0000 mg | ORAL_TABLET | Freq: Three times a day (TID) | ORAL | Status: DC | PRN

## 2014-02-26 NOTE — Patient Instructions (Signed)
General Instructions: Please check your blood sugars with the glucometer, and come with it in a month so that your medications can be adjusted.  I want you to try a medication called Mobic and Robaxin for your back pain.   ALSO PHYSICAL THERAPY, WARM AND COLD COMPRESSES ARE VERY GOOD FOR YOUR BACK AND SHOULD HELP.   Please bring your medicines with you each time you come to clinic.  Medicines may include prescription medications, over-the-counter medications, herbal remedies, eye drops, vitamins, or other pills.

## 2014-02-26 NOTE — Progress Notes (Signed)
Patient ID: [name], female   DOB: 12-Jul-1960, 53 y.o.   MRN: [id number]   Subjective:   Patient ID: [name] female   DOB: Jun 29, 1960 53 y.o.   MRN: [id number]  HPI: [name] is a 53 y.o. with PMH of CHF, HTN, CAD, DM, HLD, presented today for complaints of chronic back pains. Pt says her back pain has been worse in the past week. No hx of falls, no paraesthesia or leg weakness, no saddle anaethesia, no shooting pains, no heavy lifting recently. Pt signed a medication contract last year- Nov and wants pain meds. Pt says the pain is severe enough to prevent her from sleeping. No weightloss. She says this is the only med that helps. Pt say she has tried physical therapy and tramadol in the past with no relief.   Past Medical History  Diagnosis Date  . Hypertension   . Hyperlipidemia   . GERD (gastroesophageal reflux disease)   . Adhesive capsulitis of shoulder     bilateral, Steroid injection Dr. Truman Hayward 1/12 bilaterally  . Peripheral neuropathy   . Adhesive capsulitis of right shoulder     Steroid injection Dr. Truman Hayward 1/12  . Adhesive capsulitis of left shoulder     Steroid injection Dr. Truman Hayward 1/12  . Obesity   . Diabetic retinopathy   . Glaucoma   . Diabetes mellitus     Type II, insulin dependent  . CAD (coronary artery disease)     nonobstructive. Last cardiac cath (2008) showing left circumflex with mid 50% stenosis and distal luminal irregularities. Also with RCA with mid to distal 30-40% stenosis. // Previously evaluated by [facility], never followed up outpatient.  . CHF (congestive heart failure)   . CAP (community acquired pneumonia) 06/15/2012    05/2012 CXR: Mild opacification of the posterior lung base on the lateral film  as cannot exclude infection/atelectasis.     Current Outpatient Prescriptions  Medication Sig Dispense Refill  . amitriptyline (ELAVIL) 25 MG tablet Take 25 mg by mouth at bedtime.      Marland Kitchen aspirin EC 325 MG EC tablet Take 1 tablet  (325 mg total) by mouth daily.  30 tablet  0  . baclofen (LIORESAL) 20 MG tablet Take 1 tablet (20 mg total) by mouth 3 (three) times daily as needed for muscle spasms.  60 each  0  . Blood Glucose Monitoring Suppl (ONETOUCH VERIO IQ SYSTEM) W/DEVICE KIT Check blood sugar as instructed up to 3 times a day  1 kit  0  . carvedilol (COREG) 6.25 MG tablet Take 1 tablet (6.25 mg total) by mouth 2 (two) times daily.  60 tablet  11  . dorzolamide-timolol (COSOPT) 22.3-6.8 MG/ML ophthalmic solution Place 1 drop into the right eye 2 (two) times daily.      . ferrous sulfate 325 (65 FE) MG tablet Take 1 tablet (325 mg total) by mouth daily with breakfast.  30 tablet  11  . furosemide (LASIX) 40 MG tablet Take 0.5 tablets (20 mg total) by mouth daily.  30 tablet  0  . gabapentin (NEURONTIN) 300 MG capsule Take 1 capsule (300 mg total) by mouth 2 (two) times daily.  60 capsule  3  . glucose blood (ONETOUCH VERIO) test strip Check blood sugar as instructed up to 3 times a day  100 each  12  . HYDROcodone-acetaminophen (NORCO/VICODIN) 5-325 MG per tablet Take 1 tablet by mouth every 6 (six) hours as needed.  10 tablet  0  .  insulin aspart (NOVOLOG FLEXPEN) 100 UNIT/ML FlexPen Inject 4-6 Units into the skin 3 (three) times daily before meals. 4 units in the morning and 6 units in the evening  15 mL  1  . Insulin Glargine (LANTUS SOLOSTAR) 100 UNIT/ML Solostar Pen Inject 44 Units into the skin at bedtime.      . Insulin Pen Needle (B-D UF III MINI PEN NEEDLES) 31G X 5 MM MISC Use to inject insulin 4-5 times daily  200 each  5  . lisinopril (PRINIVIL,ZESTRIL) 20 MG tablet Take 10 mg by mouth daily.      . metFORMIN (GLUCOPHAGE) 1000 MG tablet Take 1,000 mg by mouth 2 (two) times daily with a meal.      . ONETOUCH DELICA LANCETS FINE MISC Check blood sugar as instructed up to 3 times a day  100 each  12  . permethrin (ELIMITE) 5 % cream Apply to entire body other than face - let sit for 14 hours then wash off, may  repeat in 1 week if still having symptoms  60 g  1  . pravastatin (PRAVACHOL) 20 MG tablet Take 20 mg by mouth daily.       No current facility-administered medications for this visit.   Family History  Problem Relation Age of Onset  . Hypertension Sister   . Hypertension Sister   . Hypertension Sister    History   Social History  . Marital Status: Single    Spouse Name: N/A    Number of Children: 2  . Years of Education: 12th grade   Occupational History  . 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    Social History Main Topics  . Smoking status: Former Smoker -- 0.30 packs/day for 7 years    Types: Cigarettes    Quit date: 06/27/2006  . Smokeless tobacco: Never Used  . Alcohol Use: No  . Drug Use: No  . Sexual Activity: Not Currently   Other Topics Concern  . None   Social History Narrative   Lives with her kids, 41yo daughter and 28 yo adopted son.   Review of Systems: CONSTITUTIONAL- No Fever, weightloss, night sweat or change in appetite. SKIN- No Rash, colour changes or itching. HEAD- No Headache or dizziness. Mouth/throat- No Sorethroat, dentures, or bleeding gums. RESPIRATORY- No Cough or SOB. CARDIAC- No Palpitations, DOE, PND or chest pain. GI- No nausea, vomiting, diarrhoea, constipation, abd pain. URINARY- No Frequency, urgency, straining or dysuria. NEUROLOGIC- No Numbness, syncope, seizures or burning. [facility]- Denies depression or anxiety.  Objective:  Physical Exam: Filed Vitals:   02/26/14 1435  BP: 169/86  Pulse: 83  Temp: 97.8 F (36.6 C)  TempSrc: Oral  Height: '5\' 6"'  (1.676 m)  Weight: 251 lb 14.4 oz (114.261 kg)  SpO2: 100%   GENERAL- alert, co-operative, appears as stated age, not in any distress. HEENT- Atraumatic, normocephalic, PERRL, EOMI, oral mucosa appears moist, neck supple. CARDIAC- RRR, no murmurs, rubs or gallops. RESP- Moving equal volumes of air, and clear to  auscultation bilaterally, no wheezes or crackles. ABDOMEN- Soft, nontender, no guarding or rebound, no palpable masses or organomegaly, bowel sounds present. BACK- Normal curvature of the spine, tenderness elicited on palpation of her lower back, otherwise normal range of motion, no sensory deficits.  NEURO- No obvious Cr N abnormality, strenght upper and lower extremities- 5/5, Sensation intact- globally,  Gait- Normal. EXTREMITIES- pulse 2+, symmetric, no pedal edema. SKIN-  Warm, dry, No rash or lesion. PSYCH- Normal mood and affect, appropriate thought content and speech.  Assessment & Plan:  The patient's case and plan of care was discussed with attending physician, Dr. Ezzie Dural  Please see problem based charting for assessment and plan.

## 2014-02-27 NOTE — Assessment & Plan Note (Signed)
Hgba1c- 8.4- 02/05/2014. Pt says she is complaint with her insulin and metformin. Brought Glucometer, no recoded CBGs.   Plan- Diabetes co-ord consulted in clinic, talked to  Pt about how she should check her blood sugars. No adjustments made as no log to go by.

## 2014-02-27 NOTE — Assessment & Plan Note (Addendum)
Pt says pain has increased over the past week. Tenderness elicited on exam. MRI done- Slight facet arthritis at L3-4 and L4-5, Small disc protrusion at L5-S1 central and to the left without neural compression. Pain today most likely musculoskeletal spasms. Pt will benefit from a muscle relaxant. But pt is also requesting for pain meds- as she has a contract and is not ready to try try tramadol which never helped.  Plan- Will give pt 10 tabs of hydrocodone-acetaminophen- 5-325mg . Pt will have to follow up with PCP for refills, as she has a contract already, though no identified justifiable reason for pain.  - Muscle relaxants- as she dose have tenderness-lumber region- Baclofen 20mg  TID. - Physical therapy- though pt initaily opposed to this.

## 2014-03-04 ENCOUNTER — Other Ambulatory Visit: Payer: Self-pay | Admitting: *Deleted

## 2014-03-04 ENCOUNTER — Other Ambulatory Visit: Payer: Self-pay | Admitting: Internal Medicine

## 2014-03-05 NOTE — Progress Notes (Signed)
Internal Medicine Clinic Attending Date of visit: 02/26/2014   Case discussed with Dr. Denton Brick soon after the resident saw the patient.  We reviewed the resident's history and exam and pertinent patient test results.  I agree with the assessment, diagnosis, and plan of care documented in the resident's note.

## 2014-03-05 NOTE — Telephone Encounter (Signed)
Pls make appt with PCP or [facility] resident DM care and routine F/U. 1-2 months.

## 2014-03-20 ENCOUNTER — Other Ambulatory Visit: Payer: Self-pay | Admitting: *Deleted

## 2014-03-20 DIAGNOSIS — E1165 Type 2 diabetes mellitus with hyperglycemia: Secondary | ICD-10-CM

## 2014-03-20 DIAGNOSIS — IMO0002 Reserved for concepts with insufficient information to code with codable children: Secondary | ICD-10-CM

## 2014-03-20 DIAGNOSIS — E1140 Type 2 diabetes mellitus with diabetic neuropathy, unspecified: Secondary | ICD-10-CM

## 2014-03-20 MED ORDER — GABAPENTIN 300 MG PO CAPS: 300.0000 mg | ORAL_CAPSULE | Freq: Two times a day (BID) | ORAL | Status: DC

## 2014-03-20 NOTE — Addendum Note (Signed)
Addended by: Hulan Fray on: 03/20/2014 07:50 PM   Modules accepted: Orders

## 2014-03-21 ENCOUNTER — Emergency Department (HOSPITAL_COMMUNITY)
Admission: EM | Admit: 2014-03-21 | Discharge: 2014-03-21 | Disposition: A | Discharge status: Home / Self Care | Payer: Medicare Other | Attending: Emergency Medicine | Admitting: Emergency Medicine

## 2014-03-21 ENCOUNTER — Encounter (HOSPITAL_COMMUNITY): Payer: Self-pay | Admitting: Emergency Medicine

## 2014-03-21 DIAGNOSIS — R21 Rash and other nonspecific skin eruption: Secondary | ICD-10-CM | POA: Insufficient documentation

## 2014-03-21 DIAGNOSIS — E11319 Type 2 diabetes mellitus with unspecified diabetic retinopathy without macular edema: Secondary | ICD-10-CM | POA: Insufficient documentation

## 2014-03-21 DIAGNOSIS — I10 Essential (primary) hypertension: Secondary | ICD-10-CM | POA: Insufficient documentation

## 2014-03-21 DIAGNOSIS — Z8701 Personal history of pneumonia (recurrent): Secondary | ICD-10-CM | POA: Insufficient documentation

## 2014-03-21 DIAGNOSIS — E1139 Type 2 diabetes mellitus with other diabetic ophthalmic complication: Secondary | ICD-10-CM | POA: Diagnosis not present

## 2014-03-21 DIAGNOSIS — I2510 Atherosclerotic heart disease of native coronary artery without angina pectoris: Secondary | ICD-10-CM | POA: Insufficient documentation

## 2014-03-21 DIAGNOSIS — Z8739 Personal history of other diseases of the musculoskeletal system and connective tissue: Secondary | ICD-10-CM | POA: Diagnosis not present

## 2014-03-21 DIAGNOSIS — Z79899 Other long term (current) drug therapy: Secondary | ICD-10-CM | POA: Insufficient documentation

## 2014-03-21 DIAGNOSIS — I509 Heart failure, unspecified: Secondary | ICD-10-CM | POA: Insufficient documentation

## 2014-03-21 DIAGNOSIS — E785 Hyperlipidemia, unspecified: Secondary | ICD-10-CM | POA: Diagnosis not present

## 2014-03-21 DIAGNOSIS — Z8719 Personal history of other diseases of the digestive system: Secondary | ICD-10-CM | POA: Insufficient documentation

## 2014-03-21 DIAGNOSIS — Z794 Long term (current) use of insulin: Secondary | ICD-10-CM | POA: Diagnosis not present

## 2014-03-21 DIAGNOSIS — Z7982 Long term (current) use of aspirin: Secondary | ICD-10-CM | POA: Insufficient documentation

## 2014-03-21 DIAGNOSIS — G609 Hereditary and idiopathic neuropathy, unspecified: Secondary | ICD-10-CM | POA: Diagnosis not present

## 2014-03-21 DIAGNOSIS — Z9889 Other specified postprocedural states: Secondary | ICD-10-CM | POA: Insufficient documentation

## 2014-03-21 DIAGNOSIS — Z87891 Personal history of nicotine dependence: Secondary | ICD-10-CM | POA: Insufficient documentation

## 2014-03-21 DIAGNOSIS — H409 Unspecified glaucoma: Secondary | ICD-10-CM | POA: Insufficient documentation

## 2014-03-21 DIAGNOSIS — E669 Obesity, unspecified: Secondary | ICD-10-CM | POA: Diagnosis not present

## 2014-03-21 MED ORDER — TRIAMCINOLONE ACETONIDE 0.1 % EX CREA: 1.0000 "application " | TOPICAL_CREAM | Freq: Two times a day (BID) | CUTANEOUS | Status: DC

## 2014-03-21 NOTE — Discharge Instructions (Signed)
Read the information below.  Use the prescribed medication as directed.  Please discuss all new medications with your pharmacist.  You may return to the Emergency Department at any time for worsening condition or any new symptoms that concern you.   If you develop redness, swelling, pus draining from the wound, or fevers greater than 100.4, return to the ER immediately for a recheck.   ° ° °Rash °A rash is a change in the color or texture of your skin. There are many different types of rashes. You may have other problems that accompany your rash. °CAUSES  °· Infections. °· Allergic reactions. This can include allergies to pets or foods. °· Certain medicines. °· Exposure to certain chemicals, soaps, or cosmetics. °· Heat. °· Exposure to poisonous plants. °· Tumors, both cancerous and noncancerous. °SYMPTOMS  °· Redness. °· Scaly skin. °· Itchy skin. °· Dry or cracked skin. °· Bumps. °· Blisters. °· Pain. °DIAGNOSIS  °Your caregiver may do a physical exam to determine what type of rash you have. A skin sample (biopsy) may be taken and examined under a microscope. °TREATMENT  °Treatment depends on the type of rash you have. Your caregiver may prescribe certain medicines. For serious conditions, you may need to see a skin doctor (dermatologist). °HOME CARE INSTRUCTIONS  °· Avoid the substance that caused your rash. °· Do not scratch your rash. This can cause infection. °· You may take cool baths to help stop itching. °· Only take over-the-counter or prescription medicines as directed by your caregiver. °· Keep all follow-up appointments as directed by your caregiver. °SEEK IMMEDIATE MEDICAL CARE IF: °· You have increasing pain, swelling, or redness. °· You have a fever. °· You have new or severe symptoms. °· You have body aches, diarrhea, or vomiting. °· Your rash is not better after 3 days. °MAKE SURE YOU: °· Understand these instructions. °· Will watch your condition. °· Will get help right away if you are not doing  well or get worse. °Document Released: 06/03/2002 Document Revised: 09/05/2011 Document Reviewed: 03/28/2011 °ExitCare® Patient Information ©2015 [name]. This information is not intended to replace advice given to you by your health care provider. Make sure you discuss any questions you have with your health care provider. ° °

## 2014-03-21 NOTE — ED Provider Notes (Signed)
CSN: [id number]     Arrival date & time 03/21/14  0825 History  This chart was scribed for non-physician practitioner Clayton Bibles, PA-C working with Johnna Acosta, MD by Ludger Nutting, ED Scribe. This patient was seen in room TR05C/TR05C and the patient's care was started at 9:31 AM.    Chief Complaint  Patient presents with  . Rash    The history is provided by the patient. No language interpreter was used.    HPI Comments: [name] is a 53 y.o. female with past medical history of DM who presents to the Emergency Department complaining of 2 weeks of a gradual onset, constant, gradually worsening rash to the posterior neck and back. She reports associated swelling, itching, and burning. She reports associated pain to the surrounding skin. She has tried benadryl, lotion, and water without relief. She denies fever, discharge. Denies any exposures - did change personal care products to "chemical-free" detergent and soap last month after being treated for rash (with permethrin cream with relief). She has an appointment with dermatology on 04/04/14.    Past Medical History  Diagnosis Date  . Hypertension   . Hyperlipidemia   . GERD (gastroesophageal reflux disease)   . Adhesive capsulitis of shoulder     bilateral, Steroid injection Dr. Truman Hayward 1/12 bilaterally  . Peripheral neuropathy   . Adhesive capsulitis of right shoulder     Steroid injection Dr. Truman Hayward 1/12  . Adhesive capsulitis of left shoulder     Steroid injection Dr. Truman Hayward 1/12  . Obesity   . Diabetic retinopathy   . Glaucoma   . Diabetes mellitus     Type II, insulin dependent  . CAD (coronary artery disease)     nonobstructive. Last cardiac cath (2008) showing left circumflex with mid 50% stenosis and distal luminal irregularities. Also with RCA with mid to distal 30-40% stenosis. // Previously evaluated by [name], never followed up outpatient.  . CHF (congestive heart failure)   . CAP (community acquired pneumonia)  06/15/2012    05/2012 CXR: Mild opacification of the posterior lung base on the lateral film  as cannot exclude infection/atelectasis.     Past Surgical History  Procedure Laterality Date  . Cataract extraction    . Glaucoma surgery    . Cardiac catheterization    . Tubal ligation     Family History  Problem Relation Age of Onset  . Hypertension Sister   . Hypertension Sister   . Hypertension Sister    History  Substance Use Topics  . Smoking status: Former Smoker -- 0.30 packs/day for 7 years    Types: Cigarettes    Quit date: 06/27/2006  . Smokeless tobacco: Never Used  . Alcohol Use: No   OB History   Grav Para Term Preterm Abortions TAB SAB Ect Mult Living                 Review of Systems  Constitutional: Negative for fever.  Musculoskeletal: Negative for back pain and neck pain.  Skin: Positive for rash.      Allergies  Review of patient's allergies indicates no known allergies.  Home Medications   Prior to Admission medications   Medication Sig Start Date End Date Taking? Authorizing Provider  amitriptyline (ELAVIL) 25 MG tablet TAKE 1 TABLET BY MOUTH EVERY NIGHT AT BEDTIME 03/05/14   Bartholomew Crews, MD  aspirin EC 325 MG EC tablet Take 1 tablet (325 mg total) by mouth daily. 08/23/13   Jeneen Rinks  Belenda Cruise, PA-C  baclofen (LIORESAL) 20 MG tablet Take 1 tablet (20 mg total) by mouth 3 (three) times daily as needed for muscle spasms. 02/26/14   Ejiroghene Arlyce Dice, MD  Blood Glucose Monitoring Suppl (ONETOUCH VERIO IQ SYSTEM) W/DEVICE KIT Check blood sugar as instructed up to 3 times a day 02/26/14   Ejiroghene E Denton Brick, MD  carvedilol (COREG) 6.25 MG tablet Take 1 tablet (6.25 mg total) by mouth 2 (two) times daily. 03/11/13   Sherren Mocha, MD  dorzolamide-timolol (COSOPT) 22.3-6.8 MG/ML ophthalmic solution Place 1 drop into the right eye 2 (two) times daily.    Historical Provider, MD  ferrous sulfate 325 (65 FE) MG tablet Take 1 tablet (325 mg total) by mouth  daily with breakfast. 06/15/12   Valaria Good, MD  furosemide (LASIX) 40 MG tablet Take 0.5 tablets (20 mg total) by mouth daily. 02/10/14   Aldine Contes, MD  gabapentin (NEURONTIN) 300 MG capsule Take 1 capsule (300 mg total) by mouth 2 (two) times daily. 03/20/14   Annia Belt, MD  glucose blood (ONETOUCH VERIO) test strip Check blood sugar as instructed up to 3 times a day 02/26/14   Ejiroghene E Denton Brick, MD  HYDROcodone-acetaminophen (NORCO/VICODIN) 5-325 MG per tablet Take 1 tablet by mouth every 6 (six) hours as needed. 02/26/14   Ejiroghene Arlyce Dice, MD  insulin aspart (NOVOLOG FLEXPEN) 100 UNIT/ML FlexPen Inject 4-6 Units into the skin 3 (three) times daily before meals. 4 units in the morning and 6 units in the evening    Madilyn Fireman, MD  Insulin Glargine (LANTUS SOLOSTAR) 100 UNIT/ML Solostar Pen Inject 44 Units into the skin at bedtime.    Historical Provider, MD  Insulin Pen Needle (B-D UF III MINI PEN NEEDLES) 31G X 5 MM MISC Use to inject insulin 4-5 times daily    Madilyn Fireman, MD  lisinopril (PRINIVIL,ZESTRIL) 20 MG tablet Take 10 mg by mouth daily.    Historical Provider, MD  metFORMIN (GLUCOPHAGE) 1000 MG tablet Take 1,000 mg by mouth 2 (two) times daily with a meal.    Historical Provider, MD  [facility] DELICA LANCETS FINE MISC Check blood sugar as instructed up to 3 times a day 02/26/14   Ejiroghene E Denton Brick, MD  permethrin (ELIMITE) 5 % cream Apply to entire body other than face - let sit for 14 hours then wash off, may repeat in 1 week if still having symptoms 02/18/14   Montine Circle, PA-C  pravastatin (PRAVACHOL) 20 MG tablet TAKE 1 TABLET BY MOUTH EVERY DAY 03/05/14   Bartholomew Crews, MD   BP 139/79  Pulse 100  Temp(Src) 97.7 F (36.5 C) (Oral)  Ht _0  (1.676 m)  Wt 242 lb (109.77 kg)  BMI 39.08 kg/m2  SpO2 100%  LMP 02/13/2011 Physical Exam  Nursing note and vitals reviewed. Constitutional: She appears well-developed and well-nourished. No  distress.  HENT:  Head: Normocephalic and atraumatic.  Neck: Neck supple.  Cardiovascular: Normal rate.   Pulmonary/Chest: Effort normal.  Neurological: She is alert.  Skin: Rash noted. She is not diaphoretic. No erythema.  Posterior neck with hyperpigmented, scaly, thickened lichenified rash. No erythema, edema, warmth, or discharge.     ED Course  Procedures (including critical care time)  DIAGNOSTIC STUDIES: Oxygen Saturation is 100% on RA, normal by my interpretation.    COORDINATION OF CARE: 9:38 AM Will discharge home with triamcinolone cream. Discussed treatment plan with pt at bedside and pt agreed to plan.  Labs Review Labs Reviewed - No data to display  Imaging Review No results found.   EKG Interpretation None      MDM   Final diagnoses:  Rash    Afebrile, nontoxic patient with rash x several weeks to posterior neck.  Appearance simialr to atopic dermatitis, eczema vs acanthosis nigricans.  D/C home with steroid cream (triamcinolone). PCP follow up.  Discussed result, findings, treatment, and follow up  with patient.  Pt given return precautions.  Pt verbalizes understanding and agrees with plan.       I personally performed the services described in this documentation, which was scribed in my presence. The recorded information has been reviewed and is accurate.   Clayton Bibles, PA-C 03/21/14 1230

## 2014-03-21 NOTE — ED Provider Notes (Signed)
Medical screening examination/treatment/procedure(s) were performed by non-physician practitioner and as supervising physician I was immediately available for consultation/collaboration.    Tristine Langi D Starkisha Tullis, MD 03/21/14 1911 

## 2014-03-21 NOTE — ED Notes (Signed)
X 2 weeks of a rash/dry skin on back of neck and lower abd. No redness. Mild itching.

## 2014-03-25 ENCOUNTER — Ambulatory Visit (INDEPENDENT_AMBULATORY_CARE_PROVIDER_SITE_OTHER): Payer: Medicare Other | Admitting: Internal Medicine

## 2014-03-25 ENCOUNTER — Encounter: Payer: Self-pay | Admitting: Internal Medicine

## 2014-03-25 VITALS — BP 133/72 | HR 92 | Temp 98.3°F | Ht 66.0 in | Wt 253.6 lb

## 2014-03-25 DIAGNOSIS — Z23 Encounter for immunization: Secondary | ICD-10-CM

## 2014-03-25 DIAGNOSIS — M545 Low back pain, unspecified: Secondary | ICD-10-CM

## 2014-03-25 DIAGNOSIS — K219 Gastro-esophageal reflux disease without esophagitis: Secondary | ICD-10-CM

## 2014-03-25 DIAGNOSIS — E1142 Type 2 diabetes mellitus with diabetic polyneuropathy: Secondary | ICD-10-CM

## 2014-03-25 DIAGNOSIS — Z299 Encounter for prophylactic measures, unspecified: Secondary | ICD-10-CM

## 2014-03-25 DIAGNOSIS — E1165 Type 2 diabetes mellitus with hyperglycemia: Secondary | ICD-10-CM

## 2014-03-25 DIAGNOSIS — E1140 Type 2 diabetes mellitus with diabetic neuropathy, unspecified: Secondary | ICD-10-CM

## 2014-03-25 DIAGNOSIS — E1149 Type 2 diabetes mellitus with other diabetic neurological complication: Secondary | ICD-10-CM

## 2014-03-25 DIAGNOSIS — I10 Essential (primary) hypertension: Secondary | ICD-10-CM

## 2014-03-25 DIAGNOSIS — IMO0002 Reserved for concepts with insufficient information to code with codable children: Secondary | ICD-10-CM

## 2014-03-25 LAB — GLUCOSE, CAPILLARY: Glucose-Capillary: 86 mg/dL (ref 70–99)

## 2014-03-25 MED ORDER — HYDROCODONE-ACETAMINOPHEN 5-325 MG PO TABS: 1.0000 | ORAL_TABLET | Freq: Four times a day (QID) | ORAL | Status: DC | PRN

## 2014-03-25 MED ORDER — PERMETHRIN 5 % EX CREA: TOPICAL_CREAM | CUTANEOUS | Status: DC

## 2014-03-25 NOTE — Assessment & Plan Note (Signed)
-   Flu shot today  - Patient due for colonoscopy. Already has information for GI, and she says she will follow up with them for an appointment.

## 2014-03-25 NOTE — Assessment & Plan Note (Addendum)
Patient reports having symptoms of reflux, consistent with her previous symptoms. No meds in the system right now, but she reports taking prilosec in the past.   - Prilosec not part of insurance coverage for patient, but patient is amenable to obtaining the medication OTC.

## 2014-03-25 NOTE — Assessment & Plan Note (Addendum)
Patient continue to have pain in her back, 9/10 today. Patient will be following up with orthopedic surgeon and we have sent her MRI to that office today. Patient states that her baclofen is not working.   - hydrocodone/acetaminophen 5mg -325mg  1-2 tablets q6h PRN for pain #90, provided one refill  - d/c baclofen

## 2014-03-25 NOTE — Progress Notes (Signed)
   Subjective:    Patient ID: [name], female    DOB: [birth year], 53 y.o.   MRN: [id number]  HPI  [name] is a 53 yo with a history of HTN, T2DM, chronic back pain, GERD, CAD who presents to clinic for a routine follow up.  Please refer to separate problem-list charting for more details.  Review of Systems  Constitutional: Negative for fever and chills.  HENT: Negative for rhinorrhea and sore throat.   Eyes: Negative for visual disturbance.  Respiratory: Negative for cough and shortness of breath.   Cardiovascular: Negative for chest pain and palpitations.  Gastrointestinal: Positive for nausea ( last time several weeks ago). Negative for vomiting, abdominal pain, diarrhea, constipation and blood in stool.  Genitourinary: Negative for dysuria and hematuria.  Neurological: Negative for syncope.       Objective:       Physical Exam  Constitutional: She is oriented to person, place, and time. She appears well-developed and well-nourished. No distress.  HENT:  Head: Normocephalic and atraumatic.  Eyes: Pupils are equal, round, and reactive to light. Left eye exhibits no discharge.  Left eye with exotropia, stable  Neck: Normal range of motion. Neck supple. No thyromegaly present.  Cardiovascular: Normal rate and regular rhythm.  Exam reveals no gallop and no friction rub.   No murmur heard. Pulmonary/Chest: Effort normal and breath sounds normal. No respiratory distress. She has no wheezes. She has no rales.  Abdominal: Soft. Bowel sounds are normal. She exhibits no distension. There is no tenderness. There is no rebound.  Musculoskeletal: She exhibits no edema.  Neurological: She is alert and oriented to person, place, and time. No cranial nerve deficit.  Skin: Skin is warm and dry. No rash noted.  Psychiatric: She has a normal mood and affect. Thought content normal.   Filed Vitals:   03/25/14 0834  BP: 133/72  Pulse: 92  Temp: 98.3 F (36.8 C)        Assessment & Plan:  Please refer to separate problem-list charting for more details.

## 2014-03-25 NOTE — Progress Notes (Signed)
INTERNAL MEDICINE TEACHING ATTENDING ADDENDUM - Aldine Contes, MD: I personally saw and evaluated [name] in this clinic visit in conjunction with the resident, Dr. Raelene Bott. I have discussed patient's plan of care with medical resident during this visit. I have confirmed the physical exam findings and have read and agree with the clinic note including the plan with the following addition: - BP well controlled. C/w current meds - Pt educated about importance of checking blood sugars. C/w current meds for now

## 2014-03-25 NOTE — Patient Instructions (Signed)
Please follow up with orthopedic doctor for your back pain. Your MRI has been sent to their office. We have refilled your norco prescription, but we will discuss long term continuation of this medication after your appointment with the orthopedic doctors. Please take your blood glucose levels twice a day now that you have your equipment.   General Instructions:   Please bring your medicines with you each time you come to clinic.  Medicines may include prescription medications, over-the-counter medications, herbal remedies, eye drops, vitamins, or other pills.   Progress Toward Treatment Goals:  Treatment Goal 03/25/2014  Hemoglobin A1C unchanged  Blood pressure at goal  Prevent falls -    Self Care Goals & Plans:  Self Care Goal 03/25/2014  Manage my medications take my medicines as prescribed; bring my medications to every visit; refill my medications on time  Monitor my health keep track of my blood glucose; bring my glucose meter and log to each visit  Eat healthy foods drink diet soda or water instead of juice or soda; eat more vegetables; eat foods that are low in salt; eat baked foods instead of fried foods; eat fruit for snacks and desserts  Be physically active -  Meeting treatment goals -    Home Blood Glucose Monitoring 10/01/2013  Check my blood sugar no home glucose monitoring  When to check my blood sugar -     Care Management & Community Referrals:  Referral 10/01/2013  Referrals made for care management support none needed

## 2014-03-25 NOTE — Assessment & Plan Note (Signed)
Lab Results  Component Value Date   HGBA1C 8.4 02/05/2014   HGBA1C 9.4* 08/22/2013   HGBA1C 8.5 03/08/2013     Assessment: Diabetes control: fair control Progress toward A1C goal:  unchanged Comments: Last A1c two months ago at 8.4. Patient only has 5 readings in glucometer, which she says is secondary to the fact that she has not been able to get enough test strips. She reports that she has enough now. Patient currently on lantus 44 u and metformin 1000 mg BID, and SSI.   Plan: Medications:  continue current medications. Patient counseled to measure her blood glucose at home twice a day now that she has the proper equipment.  Home glucose monitoring: Frequency:  twice a day Timing:   Instruction/counseling given: reminded to bring blood glucose meter & log to each visit, discussed the need for weight loss and discussed diet Educational resources provided: brochure Self management tools provided: copy of home glucose meter download

## 2014-03-25 NOTE — Assessment & Plan Note (Signed)
BP Readings from Last 3 Encounters:  03/25/14 133/72  03/21/14 139/79  02/26/14 169/86    Lab Results  Component Value Date   NA 141 07/22/2013   K 4.5 07/22/2013   CREATININE 1.01 07/22/2013    Assessment: Blood pressure control: controlled Progress toward BP goal:  at goal Comments: Patient currently on carvedilol 6.25 mg BID, furosemide 20 mg daily, lisinopril 10 mg daily. No reports of any recent episodes of hypotension or lightheadedness.  Plan: Medications:  continue current medications

## 2014-04-04 ENCOUNTER — Other Ambulatory Visit: Payer: Self-pay | Admitting: *Deleted

## 2014-04-04 MED ORDER — LISINOPRIL 20 MG PO TABS: 10.0000 mg | ORAL_TABLET | Freq: Every day | ORAL | Status: DC

## 2014-04-04 MED ORDER — FUROSEMIDE 40 MG PO TABS: 20.0000 mg | ORAL_TABLET | Freq: Every day | ORAL | Status: DC

## 2014-04-06 ENCOUNTER — Other Ambulatory Visit: Payer: Self-pay | Admitting: Internal Medicine

## 2014-05-02 ENCOUNTER — Other Ambulatory Visit: Payer: Self-pay | Admitting: Internal Medicine

## 2014-05-02 DIAGNOSIS — E1140 Type 2 diabetes mellitus with diabetic neuropathy, unspecified: Secondary | ICD-10-CM

## 2014-05-02 DIAGNOSIS — IMO0002 Reserved for concepts with insufficient information to code with codable children: Secondary | ICD-10-CM

## 2014-05-02 DIAGNOSIS — E1165 Type 2 diabetes mellitus with hyperglycemia: Secondary | ICD-10-CM

## 2014-05-18 ENCOUNTER — Emergency Department (HOSPITAL_COMMUNITY): Payer: Medicare Other

## 2014-05-18 ENCOUNTER — Encounter (HOSPITAL_COMMUNITY): Payer: Self-pay | Admitting: Emergency Medicine

## 2014-05-18 ENCOUNTER — Emergency Department (HOSPITAL_COMMUNITY)
Admission: EM | Admit: 2014-05-18 | Discharge: 2014-05-18 | Disposition: A | Discharge status: Home / Self Care | Payer: Medicare Other | Attending: Emergency Medicine | Admitting: Emergency Medicine

## 2014-05-18 DIAGNOSIS — E11319 Type 2 diabetes mellitus with unspecified diabetic retinopathy without macular edema: Secondary | ICD-10-CM | POA: Diagnosis not present

## 2014-05-18 DIAGNOSIS — K219 Gastro-esophageal reflux disease without esophagitis: Secondary | ICD-10-CM

## 2014-05-18 DIAGNOSIS — Z8669 Personal history of other diseases of the nervous system and sense organs: Secondary | ICD-10-CM | POA: Insufficient documentation

## 2014-05-18 DIAGNOSIS — I10 Essential (primary) hypertension: Secondary | ICD-10-CM | POA: Diagnosis not present

## 2014-05-18 DIAGNOSIS — K8020 Calculus of gallbladder without cholecystitis without obstruction: Secondary | ICD-10-CM | POA: Diagnosis not present

## 2014-05-18 DIAGNOSIS — Z79899 Other long term (current) drug therapy: Secondary | ICD-10-CM | POA: Insufficient documentation

## 2014-05-18 DIAGNOSIS — R109 Unspecified abdominal pain: Secondary | ICD-10-CM | POA: Insufficient documentation

## 2014-05-18 DIAGNOSIS — Z794 Long term (current) use of insulin: Secondary | ICD-10-CM | POA: Insufficient documentation

## 2014-05-18 DIAGNOSIS — Z87891 Personal history of nicotine dependence: Secondary | ICD-10-CM | POA: Diagnosis not present

## 2014-05-18 DIAGNOSIS — N390 Urinary tract infection, site not specified: Secondary | ICD-10-CM

## 2014-05-18 DIAGNOSIS — Z8701 Personal history of pneumonia (recurrent): Secondary | ICD-10-CM | POA: Diagnosis not present

## 2014-05-18 DIAGNOSIS — D259 Leiomyoma of uterus, unspecified: Secondary | ICD-10-CM

## 2014-05-18 DIAGNOSIS — I509 Heart failure, unspecified: Secondary | ICD-10-CM | POA: Diagnosis not present

## 2014-05-18 DIAGNOSIS — E669 Obesity, unspecified: Secondary | ICD-10-CM | POA: Diagnosis not present

## 2014-05-18 DIAGNOSIS — Z7982 Long term (current) use of aspirin: Secondary | ICD-10-CM | POA: Insufficient documentation

## 2014-05-18 DIAGNOSIS — Z7952 Long term (current) use of systemic steroids: Secondary | ICD-10-CM | POA: Diagnosis not present

## 2014-05-18 DIAGNOSIS — K529 Noninfective gastroenteritis and colitis, unspecified: Secondary | ICD-10-CM

## 2014-05-18 DIAGNOSIS — I2510 Atherosclerotic heart disease of native coronary artery without angina pectoris: Secondary | ICD-10-CM | POA: Diagnosis not present

## 2014-05-18 DIAGNOSIS — M545 Low back pain: Secondary | ICD-10-CM | POA: Diagnosis present

## 2014-05-18 LAB — CBC WITH DIFFERENTIAL/PLATELET
BASOS ABS: 0 10*3/uL (ref 0.0–0.1)
BASOS PCT: 0 % (ref 0–1)
EOS PCT: 2 % (ref 0–5)
Eosinophils Absolute: 0.3 10*3/uL (ref 0.0–0.7)
HEMATOCRIT: 36.4 % (ref 36.0–46.0)
Hemoglobin: 11.6 g/dL — ABNORMAL LOW (ref 12.0–15.0)
Lymphocytes Relative: 28 % (ref 12–46)
Lymphs Abs: 3.1 10*3/uL (ref 0.7–4.0)
MCH: 28.3 pg (ref 26.0–34.0)
MCHC: 31.9 g/dL (ref 30.0–36.0)
MCV: 88.8 fL (ref 78.0–100.0)
MONO ABS: 0.5 10*3/uL (ref 0.1–1.0)
Monocytes Relative: 5 % (ref 3–12)
Neutro Abs: 7.3 10*3/uL (ref 1.7–7.7)
Neutrophils Relative %: 65 % (ref 43–77)
PLATELETS: 302 10*3/uL (ref 150–400)
RBC: 4.1 MIL/uL (ref 3.87–5.11)
RDW: 14.2 % (ref 11.5–15.5)
WBC: 11.2 10*3/uL — ABNORMAL HIGH (ref 4.0–10.5)

## 2014-05-18 LAB — URINE MICROSCOPIC-ADD ON

## 2014-05-18 LAB — URINALYSIS, ROUTINE W REFLEX MICROSCOPIC
Bilirubin Urine: NEGATIVE
Glucose, UA: NEGATIVE mg/dL
Ketones, ur: NEGATIVE mg/dL
Nitrite: POSITIVE — AB
PH: 7 (ref 5.0–8.0)
PROTEIN: 30 mg/dL — AB
SPECIFIC GRAVITY, URINE: 1.017 (ref 1.005–1.030)
Urobilinogen, UA: 1 mg/dL (ref 0.0–1.0)

## 2014-05-18 LAB — LIPASE, BLOOD: LIPASE: 15 U/L (ref 11–59)

## 2014-05-18 LAB — COMPREHENSIVE METABOLIC PANEL
ALBUMIN: 3.8 g/dL (ref 3.5–5.2)
ALT: 13 U/L (ref 0–35)
AST: 13 U/L (ref 0–37)
Alkaline Phosphatase: 76 U/L (ref 39–117)
Anion gap: 13 (ref 5–15)
BUN: 17 mg/dL (ref 6–23)
CALCIUM: 9.8 mg/dL (ref 8.4–10.5)
CO2: 27 mEq/L (ref 19–32)
CREATININE: 0.96 mg/dL (ref 0.50–1.10)
Chloride: 101 mEq/L (ref 96–112)
GFR calc Af Amer: 76 mL/min — ABNORMAL LOW (ref >90)
GFR calc non Af Amer: 65 mL/min — ABNORMAL LOW (ref >90)
Glucose, Bld: 130 mg/dL — ABNORMAL HIGH (ref 70–99)
Potassium: 4.2 mEq/L (ref 3.7–5.3)
SODIUM: 141 meq/L (ref 137–147)
Total Bilirubin: 0.3 mg/dL (ref 0.3–1.2)
Total Protein: 7.5 g/dL (ref 6.0–8.3)

## 2014-05-18 MED ORDER — IOHEXOL 300 MG/ML  SOLN
25.0000 mL | Freq: Once | INTRAMUSCULAR | Status: AC | PRN
  Administered 2014-05-18: 25 mL via ORAL

## 2014-05-18 MED ORDER — PANTOPRAZOLE SODIUM 40 MG IV SOLR
40.0000 mg | Freq: Once | INTRAVENOUS | Status: AC
  Administered 2014-05-18: 40 mg via INTRAVENOUS
  Filled 2014-05-18: qty 40

## 2014-05-18 MED ORDER — ONDANSETRON HCL 4 MG PO TABS: 4.0000 mg | ORAL_TABLET | Freq: Three times a day (TID) | ORAL | Status: DC | PRN

## 2014-05-18 MED ORDER — MORPHINE SULFATE 4 MG/ML IJ SOLN
4.0000 mg | Freq: Once | INTRAMUSCULAR | Status: AC
  Administered 2014-05-18: 4 mg via INTRAVENOUS
  Filled 2014-05-18: qty 1

## 2014-05-18 MED ORDER — IOHEXOL 300 MG/ML  SOLN
100.0000 mL | Freq: Once | INTRAMUSCULAR | Status: AC | PRN
  Administered 2014-05-18: 100 mL via INTRAVENOUS

## 2014-05-18 MED ORDER — PANTOPRAZOLE SODIUM 20 MG PO TBEC: 20.0000 mg | DELAYED_RELEASE_TABLET | Freq: Every day | ORAL | Status: DC

## 2014-05-18 MED ORDER — CEPHALEXIN 500 MG PO CAPS: 500.0000 mg | ORAL_CAPSULE | Freq: Two times a day (BID) | ORAL | Status: DC

## 2014-05-18 MED ORDER — HYDROCODONE-ACETAMINOPHEN 5-325 MG PO TABS: 1.0000 | ORAL_TABLET | ORAL | Status: DC | PRN

## 2014-05-18 MED ORDER — SODIUM CHLORIDE 0.9 % IV BOLUS (SEPSIS)
1000.0000 mL | Freq: Once | INTRAVENOUS | Status: AC
  Administered 2014-05-18: 1000 mL via INTRAVENOUS

## 2014-05-18 MED ORDER — ONDANSETRON HCL 4 MG/2ML IJ SOLN
4.0000 mg | Freq: Once | INTRAMUSCULAR | Status: AC
  Administered 2014-05-18: 4 mg via INTRAVENOUS
  Filled 2014-05-18: qty 2

## 2014-05-18 NOTE — ED Notes (Signed)
Pt transported to CT ?

## 2014-05-18 NOTE — ED Notes (Signed)
CT phoned, notified patient completed oral contrast.

## 2014-05-18 NOTE — ED Notes (Signed)
Pt. Stated, I have back problem all the time , and I started having low back pain about 2 -3 days ago.

## 2014-05-18 NOTE — ED Notes (Signed)
Pt acuity in creased per request of [place] PA. Pt moved E-45.

## 2014-05-18 NOTE — ED Notes (Signed)
PT reported several day Hx of Vomiting with ABD.

## 2014-05-18 NOTE — ED Provider Notes (Signed)
CSN: [id number]     Arrival date & time 05/18/14  5093 History  This chart was scribed for a non-physician practitioner, Pura Spice, PA-C working with Jasper Riling. Alvino Chapel, MD by Martinique Peace, ED Scribe. The patient was seen in TR10C/TR10C. The patient's care was started at 10:21 AM.    Chief Complaint  Patient presents with  . Back Pain  . Abdominal Pain      Patient is a 53 y.o. female presenting with back pain and abdominal pain. The history is provided by the patient. No language interpreter was used.  Back Pain Associated symptoms: abdominal pain, numbness and weakness   Associated symptoms: no fever   Abdominal Pain Associated symptoms: nausea and vomiting   Associated symptoms: no chills, no fever and no vaginal discharge   HPI Comments: [name] is a 53 y.o. female who presents to the Emergency Department complaining of chronic back pain with a recent flare up starting a few days ago. She reports some numbness tingling in her arms and hands. Some numbness in her legs that she noticed a few days ago. Pt has history of DM and neuropathy and states there have been no new changes. Pt reports that she's had MRI performed recently on her back but denies hearing anything back about her results. No fever, chills, night sweats, weight loss, trouble sleeping, or IV drug use. No urinary issues or bowel incontinence. No history of back surgery. History of malignancy.   Pt also complains of abdominal pain. Patient had abdominal pain for 2 days with worsening. P patient denies anything making it better. Pt reports nausea and 3 episodes of vomiting. Nonbloody. She adds that she hasn't had much of an appetite so hasn't really been eating much. Pain exacerbated with applied pressure to abdomen. Last BM was on Friday. Stool is not black or blood. No complaints of urinary issues or bowel incontinence. No vaginal pain or discharge. No color change of stools or noticed blood. She reports  history of bladder infection and acid reflux. No history of abdominal surgery. Patient reports dysuria, frequency. Patient states abdominal pain is unlike other urinary tract infection she's had. She denies fevers.  Past Medical History  Diagnosis Date  . Hypertension   . Hyperlipidemia   . GERD (gastroesophageal reflux disease)   . Adhesive capsulitis of shoulder     bilateral, Steroid injection Dr. Truman Hayward 1/12 bilaterally  . Peripheral neuropathy   . Adhesive capsulitis of right shoulder     Steroid injection Dr. Truman Hayward 1/12  . Adhesive capsulitis of left shoulder     Steroid injection Dr. Truman Hayward 1/12  . Obesity   . Diabetic retinopathy   . Glaucoma   . Diabetes mellitus     Type II, insulin dependent  . CAD (coronary artery disease)     nonobstructive. Last cardiac cath (2008) showing left circumflex with mid 50% stenosis and distal luminal irregularities. Also with RCA with mid to distal 30-40% stenosis. // Previously evaluated by [facility], never followed up outpatient.  . CHF (congestive heart failure)   . CAP (community acquired pneumonia) 06/15/2012    05/2012 CXR: Mild opacification of the posterior lung base on the lateral film  as cannot exclude infection/atelectasis.     Past Surgical History  Procedure Laterality Date  . Cataract extraction    . Glaucoma surgery    . Cardiac catheterization    . Tubal ligation     Family History  Problem Relation Age of Onset  .  Hypertension Sister   . Hypertension Sister   . Hypertension Sister    History  Substance Use Topics  . Smoking status: Former Smoker -- 0.30 packs/day for 7 years    Types: Cigarettes    Quit date: 06/27/2006  . Smokeless tobacco: Never Used  . Alcohol Use: No   OB History    No data available     Review of Systems  Constitutional: Positive for appetite change. Negative for fever and chills.  Gastrointestinal: Positive for nausea, vomiting and abdominal pain. Negative for blood in stool and  anal bleeding.  Genitourinary: Negative for vaginal discharge and vaginal pain.  Musculoskeletal: Positive for back pain.  Neurological: Positive for weakness and numbness.  All other systems reviewed and are negative.     Allergies  Review of patient's allergies indicates no known allergies.  Home Medications   Prior to Admission medications   Medication Sig Start Date End Date Taking? Authorizing Provider  amitriptyline (ELAVIL) 25 MG tablet TAKE 1 TABLET BY MOUTH EVERY NIGHT AT BEDTIME 03/05/14   Bartholomew Crews, MD  aspirin EC 325 MG EC tablet Take 1 tablet (325 mg total) by mouth daily. 08/23/13   Erlene Senters, PA-C  Blood Glucose Monitoring Suppl (ONETOUCH VERIO IQ SYSTEM) W/DEVICE KIT Check blood sugar as instructed up to 3 times a day 02/26/14   Ejiroghene E Denton Brick, MD  carvedilol (COREG) 6.25 MG tablet Take 1 tablet (6.25 mg total) by mouth 2 (two) times daily. 03/11/13   Sherren Mocha, MD  dorzolamide-timolol (COSOPT) 22.3-6.8 MG/ML ophthalmic solution Place 1 drop into the right eye 2 (two) times daily.    Historical Provider, MD  ferrous sulfate 325 (65 FE) MG tablet Take 1 tablet (325 mg total) by mouth daily with breakfast. 06/15/12   Dorian Heckle, MD  furosemide (LASIX) 40 MG tablet Take 0.5 tablets (20 mg total) by mouth daily. 04/04/14   Luan Moore, MD  gabapentin (NEURONTIN) 300 MG capsule Take 1 capsule (300 mg total) by mouth 2 (two) times daily. 03/20/14   Annia Belt, MD  glucose blood (ONETOUCH VERIO) test strip Check blood sugar as instructed up to 3 times a day 02/26/14   Ejiroghene E Denton Brick, MD  HYDROcodone-acetaminophen (NORCO/VICODIN) 5-325 MG per tablet Take 1 tablet by mouth every 6 (six) hours as needed. 03/25/14   Luan Moore, MD  HYDROcodone-acetaminophen (NORCO/VICODIN) 5-325 MG per tablet Take 1 tablet by mouth every 6 (six) hours as needed for moderate pain. 03/25/14   Luan Moore, MD  Insulin Glargine (LANTUS SOLOSTAR) 100 UNIT/ML Solostar  Pen Inject 44 Units into the skin at bedtime.    Historical Provider, MD  Insulin Pen Needle (B-D UF III MINI PEN NEEDLES) 31G X 5 MM MISC Use to inject insulin 4-5 times daily    Madilyn Fireman, MD  lisinopril (PRINIVIL,ZESTRIL) 20 MG tablet Take 0.5 tablets (10 mg total) by mouth daily. 04/04/14   Luan Moore, MD  metFORMIN (GLUCOPHAGE) 1000 MG tablet TAKE 1 TABLET BY MOUTH TWICE DAILY WITH MEALS 04/07/14   Luan Moore, MD  NOVOLOG FLEXPEN 100 UNIT/ML FlexPen INJECT 4-6 UNITS INTO THE SKIN THREE TIMES DAILY BEFORE MEALS. 4 UNITS IN THE MORNING AND 6 UNITS IN THE EVENING 05/04/14   Luan Moore, MD  [facility] DELICA LANCETS FINE MISC Check blood sugar as instructed up to 3 times a day 02/26/14   Ejiroghene E Denton Brick, MD  permethrin (ELIMITE) 5 % cream Apply to entire body other than face - let  sit for 14 hours then wash off, may repeat in 1 week if still having symptoms 03/25/14   Luan Moore, MD  pravastatin (PRAVACHOL) 20 MG tablet TAKE 1 TABLET BY MOUTH EVERY DAY 03/05/14   Bartholomew Crews, MD  triamcinolone cream (KENALOG) 0.1 % Apply 1 application topically 2 (two) times daily. 03/21/14   Clayton Bibles, PA-C   BP 132/65 mmHg  Pulse 88  Temp(Src) 98.7 F (37.1 C) (Oral)  Resp 17  Ht '5\' 6"'  (1.676 m)  Wt 234 lb (106.142 kg)  BMI 37.79 kg/m2  SpO2 100%  LMP 02/13/2011 Physical Exam  Constitutional: She appears well-developed and well-nourished. No distress.  HENT:  Head: Normocephalic and atraumatic.  Eyes: Conjunctivae are normal. Right eye exhibits no discharge. Left eye exhibits no discharge.  Cardiovascular: Normal rate, regular rhythm and normal heart sounds.   Pulmonary/Chest: Effort normal and breath sounds normal. No respiratory distress. She has no wheezes.  Abdominal: Soft. Bowel sounds are normal. She exhibits no distension. Tenderness: mild suprapubic.  Mild suprapubic and RLQ and LLQ tenderness to palpation without rigidity guarding, rebound.   Musculoskeletal:  No midline  back tenderness, step off or crepitus. Right and Left sided lower back tenderness. No CVA tenderness.  Neurological: She is alert. Coordination normal.  Equal muscle tone. 5/5 strength in lower left extremity. 4-5 strength in right lower extremity. DTR equal and intact. Positive right straight leg raise. Antalgic gait.   Skin: Skin is warm and dry. She is not diaphoretic.  Nursing note and vitals reviewed.   ED Course  Procedures (including critical care time) Labs Review Labs Reviewed  URINALYSIS, ROUTINE W REFLEX MICROSCOPIC - Abnormal; Notable for the following:    APPearance TURBID (*)    Hgb urine dipstick MODERATE (*)    Protein, ur 30 (*)    Nitrite POSITIVE (*)    Leukocytes, UA LARGE (*)    All other components within normal limits  URINE MICROSCOPIC-ADD ON - Abnormal; Notable for the following:    Bacteria, UA MANY (*)    All other components within normal limits  CBC WITH DIFFERENTIAL  COMPREHENSIVE METABOLIC PANEL  LIPASE, BLOOD    Imaging Review No results found.   EKG Interpretation None     Medications  sodium chloride 0.9 % bolus 1,000 mL (not administered)  morphine 4 MG/ML injection 4 mg (not administered)  ondansetron (ZOFRAN) injection 4 mg (not administered)    10:31 AM- Treatment plan was discussed with patient who verbalizes understanding and agrees.    MDM   Final diagnoses:  None   Patient with history of chronic back pain without new changes. Patient also with peripheral neuropathy and notes numbness tingling intermittently in bilateral legs. Patient reports she had recent MRI but does not know the findings.Pt with chronic back pain. No saddle anesthesia or bladder or bowel dysfunction. I think back pain is stable.  Patient also with abdominal pain has been going on for 2 days with associated nausea and vomiting. Patient with urinary tract infection however she states this pain is different.   Plan: Will order pain meds IV fluids and  antinausea meds and labs. Depending on the results, abdominal pain could be contributed to urinary tract infection or she could have further workup.  Discussed all results and patient verbalizes understanding and agrees with plan.    I personally performed the services described in this documentation, which was scribed in my presence. The recorded information has been reviewed and is accurate.  Pura Spice, PA-C 05/18/14 [place] Alvino Chapel, MD 05/18/14 [phone]

## 2014-05-18 NOTE — ED Notes (Signed)
Report to Carla RN.

## 2014-05-18 NOTE — Discharge Instructions (Signed)
°  Read the information below.  Use the prescribed medication as directed.  Please discuss all new medications with your pharmacist.  Do not take additional tylenol while taking the prescribed pain medication to avoid overdose.  You may return to the Emergency Department at any time for worsening condition or any new symptoms that concern you.  If you develop high fevers, worsening abdominal pain, uncontrolled vomiting, or are unable to tolerate fluids by mouth, return to the ER for a recheck.        Metformin and X-ray Contrast Studies For some X-ray exams, a contrast dye is used. Contrast dye is a type of medicine used to make the X-ray image clearer. The contrast dye is given to the patient through a vein (intravenously). If you need to have this type of X-ray exam and you take a medication called metformin, your caregiver may have you stop taking metformin before the exam.  LACTIC ACIDOSIS In rare cases, a serious medical condition called lactic acidosis can develop in people who take metformin and receive contrast dye. The following conditions can increase the risk of this complication:   Kidney failure.  Liver problems.  Certain types of heart problems such as:  Heart failure.  Heart attack.  Heart infection.  Heart valve problems.  Alcohol abuse. If left untreated, lactic acidosis can lead to coma.  SYMPTOMS OF LACTIC ACIDOSIS Symptoms of lactic acidosis can include:  Rapid breathing (hyperventilation).  Neurologic symptoms such as:  Headaches.  Confusion.  Dizziness.  Excessive sweating.  Feeling sick to your stomach (nauseous) or throwing up (vomiting). AFTER THE X-RAY EXAM  Stay well-hydrated. Drink fluids as instructed by your caregiver.  If you have a risk of developing lactic acidosis, blood tests may be done to make sure your kidney function is okay.  Metformin is usually stopped for 48 hours after the X-ray exam. Ask your caregiver when you can start  taking metformin again. SEEK MEDICAL CARE IF:   You have shortness of breath or difficulty breathing.  You develop a headache that does not go away.  You have nausea or vomiting.  You urinate more than normal.  You develop a skin rash and have:  Redness.  Swelling.  Itching. Document Released: 06/01/2009 Document Revised: 09/05/2011 Document Reviewed: 06/01/2009 [facility] Patient Information 2015 [place], [place]. This information is not intended to replace advice given to you by your health care provider. Make sure you discuss any questions you have with your health care provider.

## 2014-05-18 NOTE — ED Provider Notes (Signed)
CSN: [id number]     Arrival date & time 05/18/14  2202 History   First MD Initiated Contact with Patient 05/18/14 1012     Chief Complaint  Patient presents with  . Back Pain  . Abdominal Pain     (Consider location/radiation/quality/duration/timing/severity/associated sxs/prior Treatment) HPI   10:53 AM Pt moved to main ED from fast track.  Sign out from [place], [place].   Pt with complicated medical hx including chronic back pain, recent MRI, chronic weakness in left leg.  No red flags for back pain. Back pain is unchanged from chronic.  Also with abdominal pain, N/V nonbloody.  Has chronic UTI but states this is different.   No fevers, no vaginal or bowel complaints.  Plan for labs, reexamination.  If negative, suspect UTI may be cause of symptoms.     Patient states she has had epigastric pain, N/V, chills, is feeling full" all the time, foul taste in back of mouth, feels like something is stuck in her throat, x 1 week.  Has also had loose stools x 1-2 weeks. Pain is worse after eating.  Has hx reflux and currently does not take medications but states this is much worse than she has ever felt reflux before.  Has also had dysuria, urinary frequency and urgency x a few days.  Has chronic back pain but notes she has pain toward the middle of her back now that is unusual.   Denies vaginal discharge or bleeding.    Past Medical History  Diagnosis Date  . Hypertension   . Hyperlipidemia   . GERD (gastroesophageal reflux disease)   . Adhesive capsulitis of shoulder     bilateral, Steroid injection Dr. Truman Hayward 1/12 bilaterally  . Peripheral neuropathy   . Adhesive capsulitis of right shoulder     Steroid injection Dr. Truman Hayward 1/12  . Adhesive capsulitis of left shoulder     Steroid injection Dr. Truman Hayward 1/12  . Obesity   . Diabetic retinopathy   . Glaucoma   . Diabetes mellitus     Type II, insulin dependent  . CAD (coronary artery disease)     nonobstructive. Last cardiac cath (2008)  showing left circumflex with mid 50% stenosis and distal luminal irregularities. Also with RCA with mid to distal 30-40% stenosis. // Previously evaluated by [facility], never followed up outpatient.  . CHF (congestive heart failure)   . CAP (community acquired pneumonia) 06/15/2012    05/2012 CXR: Mild opacification of the posterior lung base on the lateral film  as cannot exclude infection/atelectasis.     Past Surgical History  Procedure Laterality Date  . Cataract extraction    . Glaucoma surgery    . Cardiac catheterization    . Tubal ligation     Family History  Problem Relation Age of Onset  . Hypertension Sister   . Hypertension Sister   . Hypertension Sister    History  Substance Use Topics  . Smoking status: Former Smoker -- 0.30 packs/day for 7 years    Types: Cigarettes    Quit date: 06/27/2006  . Smokeless tobacco: Never Used  . Alcohol Use: No   OB History    No data available     Review of Systems  All other systems reviewed and are negative.     Allergies  Review of patient's allergies indicates no known allergies.  Home Medications   Prior to Admission medications   Medication Sig Start Date End Date Taking? Authorizing Provider  amitriptyline (  ELAVIL) 25 MG tablet TAKE 1 TABLET BY MOUTH EVERY NIGHT AT BEDTIME 03/05/14   Bartholomew Crews, MD  aspirin EC 325 MG EC tablet Take 1 tablet (325 mg total) by mouth daily. 08/23/13   Erlene Senters, PA-C  Blood Glucose Monitoring Suppl (ONETOUCH VERIO IQ SYSTEM) W/DEVICE KIT Check blood sugar as instructed up to 3 times a day 02/26/14   Ejiroghene E Denton Brick, MD  carvedilol (COREG) 6.25 MG tablet Take 1 tablet (6.25 mg total) by mouth 2 (two) times daily. 03/11/13   Sherren Mocha, MD  dorzolamide-timolol (COSOPT) 22.3-6.8 MG/ML ophthalmic solution Place 1 drop into the right eye 2 (two) times daily.    Historical Provider, MD  ferrous sulfate 325 (65 FE) MG tablet Take 1 tablet (325 mg total) by mouth  daily with breakfast. 06/15/12   Dorian Heckle, MD  furosemide (LASIX) 40 MG tablet Take 0.5 tablets (20 mg total) by mouth daily. 04/04/14   Luan Moore, MD  gabapentin (NEURONTIN) 300 MG capsule Take 1 capsule (300 mg total) by mouth 2 (two) times daily. 03/20/14   Annia Belt, MD  glucose blood (ONETOUCH VERIO) test strip Check blood sugar as instructed up to 3 times a day 02/26/14   Ejiroghene E Denton Brick, MD  HYDROcodone-acetaminophen (NORCO/VICODIN) 5-325 MG per tablet Take 1 tablet by mouth every 6 (six) hours as needed. 03/25/14   Luan Moore, MD  HYDROcodone-acetaminophen (NORCO/VICODIN) 5-325 MG per tablet Take 1 tablet by mouth every 6 (six) hours as needed for moderate pain. 03/25/14   Luan Moore, MD  Insulin Glargine (LANTUS SOLOSTAR) 100 UNIT/ML Solostar Pen Inject 44 Units into the skin at bedtime.    Historical Provider, MD  Insulin Pen Needle (B-D UF III MINI PEN NEEDLES) 31G X 5 MM MISC Use to inject insulin 4-5 times daily    Madilyn Fireman, MD  lisinopril (PRINIVIL,ZESTRIL) 20 MG tablet Take 0.5 tablets (10 mg total) by mouth daily. 04/04/14   Luan Moore, MD  metFORMIN (GLUCOPHAGE) 1000 MG tablet TAKE 1 TABLET BY MOUTH TWICE DAILY WITH MEALS 04/07/14   Luan Moore, MD  NOVOLOG FLEXPEN 100 UNIT/ML FlexPen INJECT 4-6 UNITS INTO THE SKIN THREE TIMES DAILY BEFORE MEALS. 4 UNITS IN THE MORNING AND 6 UNITS IN THE EVENING 05/04/14   Luan Moore, MD  [facility] DELICA LANCETS FINE MISC Check blood sugar as instructed up to 3 times a day 02/26/14   Ejiroghene E Denton Brick, MD  permethrin (ELIMITE) 5 % cream Apply to entire body other than face - let sit for 14 hours then wash off, may repeat in 1 week if still having symptoms 03/25/14   Luan Moore, MD  pravastatin (PRAVACHOL) 20 MG tablet TAKE 1 TABLET BY MOUTH EVERY DAY 03/05/14   Bartholomew Crews, MD  triamcinolone cream (KENALOG) 0.1 % Apply 1 application topically 2 (two) times daily. 03/21/14   Clayton Bibles, PA-C   BP 111/47 mmHg   Pulse 84  Temp(Src) 98.7 F (37.1 C) (Oral)  Resp 14  Ht '5\' 6"'  (1.676 m)  Wt 234 lb (106.142 kg)  BMI 37.79 kg/m2  SpO2 98%  LMP 02/13/2011 Physical Exam  Constitutional: She appears well-developed and well-nourished. No distress.  HENT:  Head: Normocephalic and atraumatic.  Neck: Neck supple.  Cardiovascular: Normal rate and regular rhythm.   Pulmonary/Chest: Effort normal and breath sounds normal. No respiratory distress. She has no wheezes. She has no rales.  Abdominal: Soft. She exhibits no distension. There is tenderness in the right upper  quadrant, epigastric area and left upper quadrant. There is no rebound and no guarding.  Neurological: She is alert.  Skin: She is not diaphoretic.  Nursing note and vitals reviewed.   ED Course  Procedures (including critical care time) Labs Review Labs Reviewed  URINALYSIS, ROUTINE W REFLEX MICROSCOPIC - Abnormal; Notable for the following:    APPearance TURBID (*)    Hgb urine dipstick MODERATE (*)    Protein, ur 30 (*)    Nitrite POSITIVE (*)    Leukocytes, UA LARGE (*)    All other components within normal limits  URINE MICROSCOPIC-ADD ON - Abnormal; Notable for the following:    Bacteria, UA MANY (*)    All other components within normal limits  CBC WITH DIFFERENTIAL - Abnormal; Notable for the following:    WBC 11.2 (*)    Hemoglobin 11.6 (*)    All other components within normal limits  COMPREHENSIVE METABOLIC PANEL - Abnormal; Notable for the following:    Glucose, Bld 130 (*)    GFR calc non Af Amer 65 (*)    GFR calc Af Amer 76 (*)    All other components within normal limits  URINE CULTURE  LIPASE, BLOOD    Imaging Review Ct Abdomen Pelvis W Contrast  05/18/2014   CLINICAL DATA:  Abdominal and back pain 2 days. Abdominal pain is generalized. Three episodes of vomiting and nausea.  EXAM: CT ABDOMEN AND PELVIS WITH CONTRAST  TECHNIQUE: Multidetector CT imaging of the abdomen and pelvis was performed using the  standard protocol following bolus administration of intravenous contrast.  CONTRAST:  171m OMNIPAQUE IOHEXOL 300 MG/ML  SOLN  COMPARISON:  CT pelvis 08/20/2013 and CT chest 08/18/2008  FINDINGS: The lung bases demonstrate mild bibasilar atelectatic change.  Abdominal images demonstrate the liver, spleen, pancreas and adrenal glands to be within normal. Possible small stone near the gallbladder neck. Kidneys are normal in size without hydronephrosis or nephrolithiasis. Ureters are within normal. Appendix is not seen.  There is minimal wall thickening of several nondilated small bowel loops in the mid abdomen without significant adjacent inflammatory change or free fluid.  Pelvic images demonstrate a suggestion of a leiomyoma over the right-sided uterus. Ovaries, bladder and rectum are within normal. The possible tiny amount of fluid in the cul-de-sac. Mild degenerate change of the spine and hips.  IMPRESSION: Mild nonspecific wall thickening of a few small bowel loops in the mid abdomen possibly representing regional enteritis of infectious or inflammatory nature. No obstruction.  Possible single gallbladder stone.  Uterine leiomyoma.   Electronically Signed   By: DMarin OlpM.D.   On: 05/18/2014 13:45     EKG Interpretation None      MDM   Final diagnoses:  UTI (lower urinary tract infection)  Gastroesophageal reflux disease, esophagitis presence not specified  Enteritis  Calculus of gallbladder without cholecystitis without obstruction  Uterine leiomyoma, unspecified location    Afebrile, nontoxic patient with epigastric pain, N/V/D, also urinary symptoms.  Labs remarkable for leukocytosis and UTI.  Pt with tenderness on exam, CT abd/pelvis ordered which demonstrates single gallstone, nonspecific mild wall thickening of a few small bowel loops, uterine fibroid.  Pt's symptoms as she describes them sound mostly like untreated GERD, which she has.  It is unclear how much the single gallstone may  be contributing to her symptoms. LFTs are normal, pt is afebrile, she is tolerating PO well.  I doubt acute cholecystitis.  Given patient's body habitus, ultrasound is unlikely to  visualize the gallbladder or stone better than the CT. Suspect pt may have combination of problems causing her symptoms including what is likely viral enteritis - pt has had nausea, small amount of vomiting, and loose stools, she also has symptoms c/w untreated gerd, and has UTI-type symptoms.  Plan is for trial at home with medications, close follow up with PCP, also referrals to general surgery for cholelithiasis and to gyn for uterine fibroid (pt denies any pelvic pain or uterine/vaginal bleeding).  D/C home with norco, zofran, protonix, keflex.  Urine culture pending.  Discussed strict return precautions.  Discussed gallbladder/gallstone anatomy, physiology, symptoms, and reasons for urgent return to ED.  Discussed result, findings, treatment, and follow up  with patient.  Pt given return precautions.  Pt verbalizes understanding and agrees with plan.         Clayton Bibles, PA-C 05/18/14 1453  Dorie Rank, MD 05/18/14 [phone]

## 2014-05-20 ENCOUNTER — Other Ambulatory Visit: Payer: Self-pay | Admitting: *Deleted

## 2014-05-20 MED ORDER — ONDANSETRON HCL 4 MG PO TABS: 4.0000 mg | ORAL_TABLET | Freq: Three times a day (TID) | ORAL | Status: DC | PRN

## 2014-05-21 ENCOUNTER — Telehealth: Payer: Self-pay | Admitting: *Deleted

## 2014-05-21 ENCOUNTER — Other Ambulatory Visit: Payer: Self-pay | Admitting: Internal Medicine

## 2014-05-21 LAB — URINE CULTURE: Colony Count: >=100000

## 2014-05-21 MED ORDER — ONDANSETRON HCL 4 MG PO TABS: 4.0000 mg | ORAL_TABLET | Freq: Three times a day (TID) | ORAL | Status: DC | PRN

## 2014-05-21 NOTE — Telephone Encounter (Signed)
Medication approved. Will inform pt and pharmacy.[name], [name]11/25/20155:07 PM

## 2014-05-21 NOTE — Telephone Encounter (Signed)
Called to pharm 

## 2014-05-22 NOTE — Progress Notes (Signed)
ED Antimicrobial Stewardship Positive Culture Follow Up   [name] is an 53 y.o. female who presented to [name] on 05/18/2014 with a chief complaint of  Chief Complaint  Patient presents with  . Back Pain  . Abdominal Pain    Recent Results (from the past 720 hour(s))  Urine culture     Status: None   Collection Time: 05/18/14  9:48 AM  Result Value Ref Range Status   Specimen Description URINE, RANDOM  Final   Special Requests NONE  Final   Culture  Setup Time   Final    05/19/2014 03:11 Performed at [facility]   Final    >=100,000 COLONIES/ML Performed at [facility]    Culture   Final    ENTEROBACTER AEROGENES Performed at [facility]    Report Status 05/21/2014 FINAL  Final   Organism ID, Bacteria ENTEROBACTER AEROGENES  Final      Susceptibility   Enterobacter aerogenes - MIC*    CEFAZOLIN >=64 RESISTANT Resistant     CEFTRIAXONE <=1 SENSITIVE Sensitive     CIPROFLOXACIN <=0.25 SENSITIVE Sensitive     GENTAMICIN <=1 SENSITIVE Sensitive     LEVOFLOXACIN <=0.12 SENSITIVE Sensitive     NITROFURANTOIN 32 SENSITIVE Sensitive     TOBRAMYCIN <=1 SENSITIVE Sensitive     TRIMETH/SULFA <=20 SENSITIVE Sensitive     PIP/TAZO <=4 SENSITIVE Sensitive     * ENTEROBACTER AEROGENES    [x]  Treated with cephalexin, organism resistant to prescribed antimicrobial []  Patient discharged originally without antimicrobial agent and treatment is now indicated  New antibiotic prescription: Bactrim DS 1 tablet BID x 7 days  ED Provider: Quincy Carnes, PA-C   Candie Mile 05/22/2014, 9:32 AM Infectious Diseases Pharmacist Phone# [phone]

## 2014-05-24 ENCOUNTER — Telehealth: Payer: Self-pay | Admitting: Emergency Medicine

## 2014-05-26 ENCOUNTER — Ambulatory Visit (HOSPITAL_COMMUNITY)
Admission: RE | Admit: 2014-05-26 | Discharge: 2014-05-26 | Disposition: A | Discharge status: Home / Self Care | Payer: Medicare Other | Source: Ambulatory Visit | Attending: Family Medicine | Admitting: Family Medicine

## 2014-05-26 ENCOUNTER — Encounter: Payer: Self-pay | Admitting: Internal Medicine

## 2014-05-26 ENCOUNTER — Ambulatory Visit (INDEPENDENT_AMBULATORY_CARE_PROVIDER_SITE_OTHER): Payer: Medicare Other | Admitting: Internal Medicine

## 2014-05-26 VITALS — BP 130/65 | HR 88 | Temp 97.8°F | Ht 66.0 in | Wt 257.5 lb

## 2014-05-26 DIAGNOSIS — M545 Low back pain, unspecified: Secondary | ICD-10-CM

## 2014-05-26 DIAGNOSIS — E1140 Type 2 diabetes mellitus with diabetic neuropathy, unspecified: Secondary | ICD-10-CM | POA: Insufficient documentation

## 2014-05-26 DIAGNOSIS — E1165 Type 2 diabetes mellitus with hyperglycemia: Secondary | ICD-10-CM | POA: Diagnosis not present

## 2014-05-26 DIAGNOSIS — K219 Gastro-esophageal reflux disease without esophagitis: Secondary | ICD-10-CM

## 2014-05-26 DIAGNOSIS — IMO0002 Reserved for concepts with insufficient information to code with codable children: Secondary | ICD-10-CM

## 2014-05-26 DIAGNOSIS — Z299 Encounter for prophylactic measures, unspecified: Secondary | ICD-10-CM

## 2014-05-26 DIAGNOSIS — Z418 Encounter for other procedures for purposes other than remedying health state: Secondary | ICD-10-CM

## 2014-05-26 DIAGNOSIS — I10 Essential (primary) hypertension: Secondary | ICD-10-CM | POA: Diagnosis present

## 2014-05-26 LAB — POCT GLYCOSYLATED HEMOGLOBIN (HGB A1C): Hemoglobin A1C: 8.4

## 2014-05-26 LAB — GLUCOSE, CAPILLARY: GLUCOSE-CAPILLARY: 97 mg/dL (ref 70–99)

## 2014-05-26 MED ORDER — HYDROCODONE-ACETAMINOPHEN 5-325 MG PO TABS: 1.0000 | ORAL_TABLET | ORAL | Status: DC | PRN

## 2014-05-26 NOTE — Assessment & Plan Note (Addendum)
BP Readings from Last 3 Encounters:  05/26/14 130/65  05/18/14 133/59  03/25/14 133/72    Lab Results  Component Value Date   NA 141 05/18/2014   K 4.2 05/18/2014   CREATININE 0.96 05/18/2014    Assessment: Blood pressure control: controlled Progress toward BP goal:  at goal  Plan: Medications:  continue current medications: Carvedilol 6.25 mg twice a day, lisinopril 20 mg daily, Lasix 20 mg daily

## 2014-05-26 NOTE — Assessment & Plan Note (Addendum)
Patient recently evaluated in the emergency department on 05/18/2014 for epigastric pain and nausea. She states that this pain has now been going on for about 2 weeks, is constant, and is a burning sensation that goes through her chest up to her throat. Patient feels that she has a sensation that something is stuck in her throat. She has a history of GERD but has not been on PPIs lately. She was resumed on Protonix at the evaluation in the ED and she feels that this has helped minimally. Patient has had continued nausea and vomited once yesterday. Patient states that food does not exacerbate or alleviate her symptoms. During the emergency room evaluation, a CT abdomen was only remarkable for some local enteritis in the small bowel, a possible single gallbladder stone with no associated inflammatory changes. It is unlikely that the gallbladder stone is contributing to her symptoms. Patient does have a history of coronary artery disease with a catheterization in 2008 showing occlusive disease. Patient vaguely supports that the pain is worse with exertion. Patient's symptoms seem consistent with GERD, but severity of symptoms and lack of response to PPIs may warrant further evaluation from GI.   - Referral to GI.  - May also consider further cardiology workup pending GI evaluation.  - May consider further workup from general surgery for gallbladder stone pending GI evaluation.  - Continue with Protonix and Zofran  - Repeat EKG in clinic unremarkable for ischemic changes

## 2014-05-26 NOTE — Patient Instructions (Signed)
We have performed an EKG in clinic and referred you to a GI specialist. Please continue taking the protonix and the zofran to help with your symptoms.   General Instructions:   Please bring your medicines with you each time you come to clinic.  Medicines may include prescription medications, over-the-counter medications, herbal remedies, eye drops, vitamins, or other pills.   Progress Toward Treatment Goals:  Treatment Goal 03/25/2014  Hemoglobin A1C unchanged  Blood pressure at goal  Prevent falls -    Self Care Goals & Plans:  Self Care Goal 05/26/2014  Manage my medications take my medicines as prescribed; bring my medications to every visit; refill my medications on time  Monitor my health keep track of my blood glucose; bring my glucose meter and log to each visit  Eat healthy foods drink diet soda or water instead of juice or soda; eat more vegetables; eat foods that are low in salt; eat baked foods instead of fried foods  Be physically active -  Meeting treatment goals -    Home Blood Glucose Monitoring 10/01/2013  Check my blood sugar no home glucose monitoring  When to check my blood sugar -     Care Management & Community Referrals:  Referral 10/01/2013  Referrals made for care management support none needed

## 2014-05-26 NOTE — Assessment & Plan Note (Signed)
Patient having persistent back pain consistent with prior symptoms.   - continue with Norco 5/325 #90 for one month

## 2014-05-26 NOTE — Assessment & Plan Note (Signed)
Lab Results  Component Value Date   HGBA1C 8.4 05/26/2014   HGBA1C 8.4 02/05/2014   HGBA1C 9.4* 08/22/2013     Assessment: Diabetes control: fair control Progress toward A1C goal:  improved Comments: Patient did not bring glucometer today.  Plan: Medications:  continue current medications: Lantus 44 units daily at bedtime and metformin 1000 mg twice a day, and sliding scale insulin. Consider up titration once patient brings glucometer in to clinic

## 2014-05-26 NOTE — Progress Notes (Signed)
Internal Medicine Clinic Attending  I saw and evaluated the patient.  I personally confirmed the key portions of the history and exam documented by Dr. Raelene Bott and I reviewed pertinent patient test results.  The assessment, diagnosis, and plan were formulated together and I agree with the documentation in the resident's note. Agree with GI referral.

## 2014-05-26 NOTE — Progress Notes (Signed)
   Subjective:    Patient ID: [name], female    DOB: 1961-01-05, 53 y.o.   MRN: [id number]  HPI  Patient is a 53 year old female with a history of congestive heart failure, hypertension, coronary artery disease, type 2 diabetes, chronic back pain, GERD, hyperlipidemia who presents for emergency department follow-up for epigastric pain.   Please refer to separate problem-list charting for more details.  Review of Systems  Constitutional: Negative for fever and chills.  HENT: Negative for rhinorrhea and sore throat.   Eyes: Negative for visual disturbance.  Respiratory: Negative for cough and shortness of breath.   Cardiovascular: Positive for chest pain. Negative for palpitations.  Gastrointestinal: Positive for nausea, vomiting, abdominal pain and diarrhea. Negative for constipation and blood in stool.  Genitourinary: Negative for dysuria and hematuria.  Neurological: Negative for syncope.      Objective:   Physical Exam  Constitutional: She is oriented to person, place, and time. She appears well-developed and well-nourished. No distress.  HENT:  Head: Normocephalic and atraumatic.  Exotropia  Eyes: EOM are normal. Pupils are equal, round, and reactive to light. Left eye exhibits no discharge.  Neck: Normal range of motion. Neck supple. No thyromegaly present.  Cardiovascular: Normal rate and regular rhythm.  Exam reveals no gallop and no friction rub.   No murmur heard. Pulmonary/Chest: Effort normal and breath sounds normal. No respiratory distress. She has no wheezes. She has no rales.  Abdominal: Soft. Bowel sounds are normal. She exhibits no distension. There is tenderness ( Diffusely but most remarkable in the epigastrium). There is no rebound.  Musculoskeletal: She exhibits no edema.  Neurological: She is alert and oriented to person, place, and time. No cranial nerve deficit.  Skin: Skin is warm and dry. No rash noted.  Psychiatric: She has a normal mood and  affect. Thought content normal.      Assessment & Plan:  Please refer to separate problem-list charting for more details.

## 2014-05-27 ENCOUNTER — Telehealth (HOSPITAL_BASED_OUTPATIENT_CLINIC_OR_DEPARTMENT_OTHER): Payer: Self-pay | Admitting: Emergency Medicine

## 2014-05-27 NOTE — Telephone Encounter (Signed)
Post ED Visit - Positive Culture Follow-up: Successful Patient Follow-Up  Culture assessed and recommendations reviewed by: []  Wes Homeland, Pharm.D., BCPS [x]  Heide Guile, Pharm.D., BCPS []  Alycia Rossetti, Pharm.D., BCPS []  [place], [place].D., BCPS, AAHIVP []  Legrand Como, Pharm.D., BCPS, AAHIVP []  Hassie Bruce, Pharm.D. []  Milus Glazier, Pharm.D.  Positive urine culture Enterobacter .100,000 colonies/ml   []  Patient discharged without antimicrobial prescription and treatment is now indicated [x]  Organism is resistant to prescribed ED discharge antimicrobial []  Patient with positive blood cultures  Changes discussed with ED provider: Quincy Carnes New antibiotic prescription stop cephalexin, bactrim DS one tablet bid x 7 days Called to [facility] [phone]  Contacted patient, date 05/27/14, time 1108   Hazle Nordmann 05/27/2014, 11:06 AM

## 2014-05-29 ENCOUNTER — Encounter: Payer: Self-pay | Admitting: *Deleted

## 2014-06-05 ENCOUNTER — Encounter (HOSPITAL_COMMUNITY): Payer: Self-pay | Admitting: Cardiovascular Disease

## 2014-06-09 ENCOUNTER — Encounter: Payer: Medicare Other | Admitting: Internal Medicine

## 2014-06-12 ENCOUNTER — Encounter: Payer: Self-pay | Admitting: *Deleted

## 2014-06-16 ENCOUNTER — Encounter: Payer: Medicare Other | Admitting: Internal Medicine

## 2014-07-09 ENCOUNTER — Other Ambulatory Visit: Payer: Self-pay | Admitting: *Deleted

## 2014-07-10 ENCOUNTER — Ambulatory Visit: Payer: Medicare Other | Admitting: Internal Medicine

## 2014-07-10 MED ORDER — PANTOPRAZOLE SODIUM 20 MG PO TBEC: 20.0000 mg | DELAYED_RELEASE_TABLET | Freq: Every day | ORAL | Status: DC

## 2014-07-11 ENCOUNTER — Ambulatory Visit (INDEPENDENT_AMBULATORY_CARE_PROVIDER_SITE_OTHER): Payer: Medicare Other | Admitting: Internal Medicine

## 2014-07-11 ENCOUNTER — Encounter: Payer: Self-pay | Admitting: Internal Medicine

## 2014-07-11 VITALS — BP 131/58 | HR 89 | Temp 97.8°F | Ht 66.0 in | Wt 255.9 lb

## 2014-07-11 DIAGNOSIS — K219 Gastro-esophageal reflux disease without esophagitis: Secondary | ICD-10-CM

## 2014-07-11 DIAGNOSIS — IMO0002 Reserved for concepts with insufficient information to code with codable children: Secondary | ICD-10-CM

## 2014-07-11 DIAGNOSIS — H6121 Impacted cerumen, right ear: Secondary | ICD-10-CM | POA: Insufficient documentation

## 2014-07-11 DIAGNOSIS — M26609 Unspecified temporomandibular joint disorder, unspecified side: Secondary | ICD-10-CM | POA: Insufficient documentation

## 2014-07-11 DIAGNOSIS — E1165 Type 2 diabetes mellitus with hyperglycemia: Secondary | ICD-10-CM

## 2014-07-11 DIAGNOSIS — Z0289 Encounter for other administrative examinations: Secondary | ICD-10-CM

## 2014-07-11 DIAGNOSIS — Z79891 Long term (current) use of opiate analgesic: Secondary | ICD-10-CM | POA: Insufficient documentation

## 2014-07-11 DIAGNOSIS — M545 Low back pain, unspecified: Secondary | ICD-10-CM

## 2014-07-11 DIAGNOSIS — M2660 Temporomandibular joint disorder, unspecified: Secondary | ICD-10-CM

## 2014-07-11 DIAGNOSIS — Z79899 Other long term (current) drug therapy: Secondary | ICD-10-CM

## 2014-07-11 LAB — GLUCOSE, CAPILLARY: GLUCOSE-CAPILLARY: 153 mg/dL — AB (ref 70–99)

## 2014-07-11 MED ORDER — CYCLOBENZAPRINE HCL 5 MG PO TABS
5.0000 mg | ORAL_TABLET | Freq: Three times a day (TID) | ORAL | Status: DC | PRN
Start: 2014-07-11 — End: 2014-08-28

## 2014-07-11 MED ORDER — HYDROCODONE-ACETAMINOPHEN 5-325 MG PO TABS: 1.0000 | ORAL_TABLET | ORAL | Status: DC | PRN

## 2014-07-11 MED ORDER — NAPROXEN 500 MG PO TABS: 500.0000 mg | ORAL_TABLET | Freq: Two times a day (BID) | ORAL | Status: DC

## 2014-07-11 NOTE — Assessment & Plan Note (Signed)
She reports EGD and colonoscopy by Dr. Benson Norway.  No records available at this time.  Reports symptoms improved but not resolved.  Will request records.  She has follow up with her PCP in 2 weeks.

## 2014-07-11 NOTE — Assessment & Plan Note (Signed)
History and Physical exam are consistant with TMJ.   -Will treat with short course of NSAIDs +Flexeril - Patient to follow up with PCP in 2 weeks.  - Given TMJ exercises and warned to please bring up at next visit if not improved/resolved

## 2014-07-11 NOTE — Assessment & Plan Note (Signed)
At the end of her visit [name] reports that she is out of her Norco.  After review of the pain contract and last Rx by Dr. Raelene Bott this appears to be correct. -Will give 2 week Rx of Norco 5-325 (#45 pills) that should last her till her 07/28/14 appointment with Dr. Raelene Bott her PCP. Will hold off on checking a UDS today, but it would be advisable to check one in the future.

## 2014-07-11 NOTE — Assessment & Plan Note (Addendum)
-  Ear lavage by Sander Nephew RN.  Patient did not like sensation of peroxide in ear and requested to stop which was done.  Reevaluated and ear wax still present but appears to no longer be obstructing entire ear canal. -Will reevaluate at 2 week follow up with PCP.

## 2014-07-11 NOTE — Progress Notes (Signed)
[facility] Subjective:   Patient ID: [name] female   DOB: 1961/02/09 54 y.o.   MRN: [id number]  HPI: [name] is a 54 y.o. female with a PMH below who presents for an acute visit neck and ear pain.  She reports that over the past few weeks she has had pain in her neck and ear, she reports this can happen on both sides but is typically worse on the right.  She reports it worse in the morning.  She does note some heartburn too but reports she saw Dr. Benson Norway in December and had an EGD which was fine (we are requesting records). She has had some associated headahces, she has not had fever or chills. No hearing loss. Has not tried any medications to relieve the pain.     Past Medical History  Diagnosis Date  . Hypertension   . Hyperlipidemia   . GERD (gastroesophageal reflux disease)   . Adhesive capsulitis of shoulder     bilateral, Steroid injection Dr. Truman Hayward 1/12 bilaterally  . Peripheral neuropathy   . Adhesive capsulitis of right shoulder     Steroid injection Dr. Truman Hayward 1/12  . Adhesive capsulitis of left shoulder     Steroid injection Dr. Truman Hayward 1/12  . Obesity   . Diabetic retinopathy   . Glaucoma   . Diabetes mellitus     Type II, insulin dependent  . CAD (coronary artery disease)     nonobstructive. Last cardiac cath (2008) showing left circumflex with mid 50% stenosis and distal luminal irregularities. Also with RCA with mid to distal 30-40% stenosis. // Previously evaluated by [facility], never followed up outpatient.  . CHF (congestive heart failure)   . CAP (community acquired pneumonia) 06/15/2012    05/2012 CXR: Mild opacification of the posterior lung base on the lateral film  as cannot exclude infection/atelectasis.     Current Outpatient Prescriptions  Medication Sig Dispense Refill  . amitriptyline (ELAVIL) 25 MG tablet TAKE 1 TABLET BY MOUTH EVERY NIGHT AT BEDTIME 30 tablet 5  . aspirin EC 325 MG EC tablet Take 1 tablet  (325 mg total) by mouth daily. 30 tablet 0  . Blood Glucose Monitoring Suppl (ONETOUCH VERIO IQ SYSTEM) W/DEVICE KIT Check blood sugar as instructed up to 3 times a day 1 kit 0  . carvedilol (COREG) 6.25 MG tablet Take 1 tablet (6.25 mg total) by mouth 2 (two) times daily. 60 tablet 11  . cyclobenzaprine (FLEXERIL) 5 MG tablet Take 1 tablet (5 mg total) by mouth every 8 (eight) hours as needed for muscle spasms. 30 tablet 1  . dorzolamide-timolol (COSOPT) 22.3-6.8 MG/ML ophthalmic solution Place 1 drop into the right eye 2 (two) times daily.    . ferrous sulfate 325 (65 FE) MG tablet Take 1 tablet (325 mg total) by mouth daily with breakfast. 30 tablet 11  . furosemide (LASIX) 40 MG tablet Take 0.5 tablets (20 mg total) by mouth daily. 30 tablet 3  . gabapentin (NEURONTIN) 300 MG capsule Take 1 capsule (300 mg total) by mouth 2 (two) times daily. 60 capsule 3  . glucose blood (ONETOUCH VERIO) test strip Check blood sugar as instructed up to 3 times a day 100 each 12  . HYDROcodone-acetaminophen (NORCO/VICODIN) 5-325 MG per tablet Take 1 tablet by mouth every 4 (four) hours as needed for moderate pain or severe pain. 45 tablet 0  . Insulin Glargine (LANTUS SOLOSTAR) 100 UNIT/ML Solostar Pen Inject 44 Units into  the skin at bedtime.    . Insulin Pen Needle (B-D UF III MINI PEN NEEDLES) 31G X 5 MM MISC Use to inject insulin 4-5 times daily 200 each 5  . lisinopril (PRINIVIL,ZESTRIL) 20 MG tablet Take 0.5 tablets (10 mg total) by mouth daily. 90 tablet 3  . metFORMIN (GLUCOPHAGE) 1000 MG tablet TAKE 1 TABLET BY MOUTH TWICE DAILY WITH MEALS 180 tablet 1  . naproxen (NAPROSYN) 500 MG tablet Take 1 tablet (500 mg total) by mouth 2 (two) times daily with a meal. 30 tablet 1  . NOVOLOG FLEXPEN 100 UNIT/ML FlexPen INJECT 4-6 UNITS INTO THE SKIN THREE TIMES DAILY BEFORE MEALS. 4 UNITS IN THE MORNING AND 6 UNITS IN THE EVENING 15 mL 0  . ondansetron (ZOFRAN) 4 MG tablet Take 1 tablet (4 mg total) by mouth every  8 (eight) hours as needed for nausea or vomiting. 30 tablet 0  . ONETOUCH DELICA LANCETS FINE MISC Check blood sugar as instructed up to 3 times a day 100 each 12  . pantoprazole (PROTONIX) 20 MG tablet Take 1 tablet (20 mg total) by mouth daily. 90 tablet 3  . pravastatin (PRAVACHOL) 20 MG tablet TAKE 1 TABLET BY MOUTH EVERY DAY 30 tablet 5  . triamcinolone cream (KENALOG) 0.1 % Apply 1 application topically 2 (two) times daily. 30 g 0   No current facility-administered medications for this visit.   Family History  Problem Relation Age of Onset  . Hypertension Sister   . Hypertension Sister   . Hypertension Sister    History   Social History  . Marital Status: Single    Spouse Name: N/A    Number of Children: 2  . Years of Education: 12th grade   Occupational History  . 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    Social History Main Topics  . Smoking status: Former Smoker -- 0.30 packs/day for 7 years    Types: Cigarettes    Quit date: 06/27/2006  . Smokeless tobacco: Never Used  . Alcohol Use: No  . Drug Use: No  . Sexual Activity: Not Currently   Other Topics Concern  . None   Social History Narrative   Lives with her kids, 11yo daughter and 62 yo adopted son.   Review of Systems: See HPI  Objective:  Physical Exam: Filed Vitals:   07/11/14 0934  BP: 131/58  Pulse: 89  Temp: 97.8 F (36.6 C)  TempSrc: Oral  Height: _0  (1.676 m)  Weight: 255 lb 14.4 oz (116.075 kg)  SpO2: 100%   Physical Exam  Constitutional: She is well-developed, well-nourished, and in no distress.  HENT:  Right Ear: External ear normal.  Left Ear: Tympanic membrane, external ear and ear canal normal.  Bilateral tenderness over the TMJ, also muscular tenderness at angle of mandible bilaterally.  Right ear with cerumen impaction.  Cardiovascular: Normal rate and regular rhythm.   Pulmonary/Chest: Effort normal and  breath sounds normal.  Nursing note and vitals reviewed.    Assessment & Plan:  Case discussed with Dr. Dareen Piano  Excessive cerumen in right ear canal -Ear lavage by Sander Nephew RN.  Patient did not like sensation of peroxide in ear and requested to stop which was done.  Reevaluated and ear wax still present but appears to no longer be obstructing entire ear canal. -Will reevaluate at 2 week follow up with PCP.   GERD She reports EGD  and colonoscopy by Dr. Benson Norway.  No records available at this time.  Reports symptoms improved but not resolved.  Will request records.  She has follow up with her PCP in 2 weeks.   TMJ (temporomandibular joint disorder) History and Physical exam are consistant with TMJ.   -Will treat with short course of NSAIDs +Flexeril - Patient to follow up with PCP in 2 weeks.  - Given TMJ exercises and warned to please bring up at next visit if not improved/resolved   Pain medication agreement At the end of her visit [name] reports that she is out of her Norco.  After review of the pain contract and last Rx by Dr. Raelene Bott this appears to be correct. -Will give 2 week Rx of Norco 5-325 (#45 pills) that should last her till her 07/28/14 appointment with Dr. Raelene Bott her PCP. Will hold off on checking a UDS today, but it would be advisable to check one in the future.     Medications Ordered Meds ordered this encounter  Medications  . naproxen (NAPROSYN) 500 MG tablet    Sig: Take 1 tablet (500 mg total) by mouth 2 (two) times daily with a meal.    Dispense:  30 tablet    Refill:  1  . cyclobenzaprine (FLEXERIL) 5 MG tablet    Sig: Take 1 tablet (5 mg total) by mouth every 8 (eight) hours as needed for muscle spasms.    Dispense:  30 tablet    Refill:  1  . HYDROcodone-acetaminophen (NORCO/VICODIN) 5-325 MG per tablet    Sig: Take 1 tablet by mouth every 4 (four) hours as needed for moderate pain or severe pain.    Dispense:  45 tablet    Refill:  0   Other  Orders Orders Placed This Encounter  Procedures  . Glucose, capillary

## 2014-07-11 NOTE — Patient Instructions (Addendum)
General Instructions: Please take Naproxen 500mg  twice a day for the pain You can also take 5mg  of cyclobenzaprine for muscle spasm  (sort term)  Please return if this pain does not resolve.   You can try OTC Debrox or a warm water flush of your ears to loosen the ear wax at home.  Please bring your medicines with you each time you come to clinic.  Medicines may include prescription medications, over-the-counter medications, herbal remedies, eye drops, vitamins, or other pills.   Progress Toward Treatment Goals:  Treatment Goal 07/11/2014  Hemoglobin A1C -  Blood pressure unchanged  Prevent falls -    Self Care Goals & Plans:  Self Care Goal 07/11/2014  Manage my medications take my medicines as prescribed; bring my medications to every visit; refill my medications on time  Monitor my health keep track of my blood glucose; bring my glucose meter and log to each visit  Eat healthy foods drink diet soda or water instead of juice or soda; eat more vegetables; eat foods that are low in salt; eat baked foods instead of fried foods  Be physically active -  Meeting treatment goals -    Home Blood Glucose Monitoring 10/01/2013  Check my blood sugar no home glucose monitoring  When to check my blood sugar -     Care Management & Community Referrals:  Referral 10/01/2013  Referrals made for care management support none needed       Temporomandibular Problems  Temporomandibular joint (TMJ) dysfunction means there are problems with the joint between your jaw and your skull. This is a joint lined by cartilage like other joints in your body but also has a small disc in the joint which keeps the bones from rubbing on each other. These joints are like other joints and can get inflamed (sore) from arthritis and other problems. When this joint gets sore, it can cause headaches and pain in the jaw and the face. CAUSES  Usually the arthritic types of problems are caused by soreness in the joint.  Soreness in the joint can also be caused by overuse. This may come from grinding your teeth. It may also come from mis-alignment in the joint. DIAGNOSIS Diagnosis of this condition can often be made by history and exam. Sometimes your caregiver may need X-rays or an MRI scan to determine the exact cause. It may be necessary to see your dentist to determine if your teeth and jaws are lined up correctly. TREATMENT  Most of the time this problem is not serious; however, sometimes it can persist (become chronic). When this happens medications that will cut down on inflammation (soreness) help. Sometimes a shot of cortisone into the joint will be helpful. If your teeth are not aligned it may help for your dentist to make a splint for your mouth that can help this problem. If no physical problems can be found, the problem may come from tension. If tension is found to be the cause, biofeedback or relaxation techniques may be helpful. HOME CARE INSTRUCTIONS   Later in the day, applications of ice packs may be helpful. Ice can be used in a plastic bag with a towel around it to prevent frostbite to skin. This may be used about every 2 hours for 20 to 30 minutes, as needed while awake, or as directed by your caregiver.  Only take over-the-counter or prescription medicines for pain, discomfort, or fever as directed by your caregiver.  If physical therapy was prescribed, follow your  caregiver's directions.  Wear mouth appliances as directed if they were given. Document Released: 03/08/2001 Document Revised: 09/05/2011 Document Reviewed: 06/15/2008 [facility] Patient Information 2015 [place], [place]. This information is not intended to replace advice given to you by your health care provider. Make sure you discuss any questions you have with your health care provider.

## 2014-07-16 NOTE — Progress Notes (Signed)
INTERNAL MEDICINE TEACHING ATTENDING ADDENDUM - Madelena Maturin, MD: I reviewed and discussed at the time of visit with the resident Dr. Hoffman, the patient's medical history, physical examination, diagnosis and results of pertinent tests and treatment and I agree with the patient's care as documented.  

## 2014-07-28 ENCOUNTER — Ambulatory Visit (INDEPENDENT_AMBULATORY_CARE_PROVIDER_SITE_OTHER): Payer: Medicare Other | Admitting: Internal Medicine

## 2014-07-28 ENCOUNTER — Ambulatory Visit: Payer: Medicare Other | Admitting: Internal Medicine

## 2014-07-28 ENCOUNTER — Encounter: Payer: Self-pay | Admitting: Internal Medicine

## 2014-07-28 VITALS — BP 135/57 | HR 95 | Temp 98.0°F | Ht 66.0 in | Wt 254.6 lb

## 2014-07-28 DIAGNOSIS — K219 Gastro-esophageal reflux disease without esophagitis: Secondary | ICD-10-CM

## 2014-07-28 DIAGNOSIS — Z794 Long term (current) use of insulin: Secondary | ICD-10-CM

## 2014-07-28 DIAGNOSIS — E1140 Type 2 diabetes mellitus with diabetic neuropathy, unspecified: Secondary | ICD-10-CM

## 2014-07-28 DIAGNOSIS — IMO0002 Reserved for concepts with insufficient information to code with codable children: Secondary | ICD-10-CM

## 2014-07-28 DIAGNOSIS — M545 Low back pain, unspecified: Secondary | ICD-10-CM

## 2014-07-28 DIAGNOSIS — E1165 Type 2 diabetes mellitus with hyperglycemia: Secondary | ICD-10-CM

## 2014-07-28 DIAGNOSIS — B351 Tinea unguium: Secondary | ICD-10-CM

## 2014-07-28 DIAGNOSIS — M5440 Lumbago with sciatica, unspecified side: Secondary | ICD-10-CM

## 2014-07-28 LAB — GLUCOSE, CAPILLARY: GLUCOSE-CAPILLARY: 188 mg/dL — AB (ref 70–99)

## 2014-07-28 LAB — POCT GLYCOSYLATED HEMOGLOBIN (HGB A1C): Hemoglobin A1C: 8.5

## 2014-07-28 MED ORDER — HYDROCODONE-ACETAMINOPHEN 5-325 MG PO TABS: 1.0000 | ORAL_TABLET | Freq: Three times a day (TID) | ORAL | Status: DC | PRN

## 2014-07-28 MED ORDER — HYDROCODONE-ACETAMINOPHEN 5-325 MG PO TABS: 1.0000 | ORAL_TABLET | Freq: Three times a day (TID) | ORAL | Status: AC | PRN

## 2014-07-28 NOTE — Patient Instructions (Signed)
I have refilled your norco for another three month supply. Please follow up with your GI specialist soon regarding further management of your polyp. We have referred you to the podiatrist for further management of your toenails. Please bring your glucometer to your next appointment so that we can further adjust your diabetes medications. We have also given you a application for a handicap sticker.   Please follow up with [name] in 2 weeks with your glucometer.   General Instructions:   Please bring your medicines with you each time you come to clinic.  Medicines may include prescription medications, over-the-counter medications, herbal remedies, eye drops, vitamins, or other pills.   Progress Toward Treatment Goals:  Treatment Goal 07/11/2014  Hemoglobin A1C -  Blood pressure unchanged  Prevent falls -    Self Care Goals & Plans:  Self Care Goal 07/28/2014  Manage my medications take my medicines as prescribed; bring my medications to every visit; refill my medications on time  Monitor my health keep track of my blood glucose; bring my glucose meter and log to each visit  Eat healthy foods drink diet soda or water instead of juice or soda; eat more vegetables; eat foods that are low in salt; eat baked foods instead of fried foods; eat fruit for snacks and desserts  Be physically active -  Meeting treatment goals -    Home Blood Glucose Monitoring 10/01/2013  Check my blood sugar no home glucose monitoring  When to check my blood sugar -     Care Management & Community Referrals:  Referral 10/01/2013  Referrals made for care management support none needed

## 2014-07-28 NOTE — Assessment & Plan Note (Addendum)
Patient continues to have persistent back pain with no current alarm symptoms. Patient denies any bowel or bladder incontinence, saddle paresthesias, additional weakness in her legs. -Although previous notes have referred to a pain contract in the past, no pain contract was able to be found in our current system. Patient was asked to fill out a another pain contract today in clinic. -Patient seems to be low risk, but may consider a urine drug screen test in the future. -Refilled Norco 5-325 mg #90 per month for 3 months (February, March, and April 2016). Patient would need additional refills on 10/26/2014. -Due to deficits and ability to ambulate, a handicap sticker application was filled for the patient.

## 2014-07-28 NOTE — Assessment & Plan Note (Signed)
Lab Results  Component Value Date   HGBA1C 8.5 07/28/2014   HGBA1C 8.4 05/26/2014   HGBA1C 8.4 02/05/2014     Assessment: Diabetes control: fair control Progress toward A1C goal:  unchanged Comments: Patient again did not bring her blood glucose meter to clinic today. Patient states that her morning fasting blood glucose levels tend to be in the 120s though she does report some sporadic episodes of hypoglycemia as well. She states that this is usually following her administration of her short acting insulin, and tends to happen more often when her daughter helps her administer the medication. Patient states that she is otherwise compliant with her Lantus of 44 units at home. The lowest blood sugar measurement that she remembers since our last clinic visit was 26.  Plan: Medications:  continue current medications: Lantus 44 units along with sliding scale insulin and metformin 1000 mg twice a day. Will titrate medications in the future once patient has brought in her blood glucose meter. Patient recommended to have a follow-up appointment with [facility] in 2 weeks and to bring her blood glucose meter at that time as well.  Home glucose monitoring: Frequency:   Timing:   Instruction/counseling given: reminded to bring blood glucose meter & log to each visit Educational resources provided: brochure (denies) Self management tools provided: copy of home glucose meter download, home glucose logbook Other plans: None

## 2014-07-28 NOTE — Progress Notes (Signed)
   Subjective:    Patient ID: [name], female    DOB: 02-May-1961, 54 y.o.   MRN: [id number]  HPI  Patient is a 54 year old female with a history of congestive heart failure, hypertension, coronary artery disease, type 2 diabetes, chronic back pain, GERD, hyperlipidemia who presents for a disability sticker and refill of her pain medications.    Please refer to separate problem-list charting for more details.  Review of Systems  Constitutional: Negative for fever and chills.  HENT: Negative for rhinorrhea and sore throat.   Eyes: Negative for visual disturbance.  Respiratory: Negative for cough and shortness of breath.   Cardiovascular: Negative for chest pain and palpitations.  Gastrointestinal: Negative for nausea, vomiting, abdominal pain, diarrhea, constipation and blood in stool.  Genitourinary: Negative for dysuria and hematuria.  Musculoskeletal: Positive for back pain and gait problem.  Neurological: Negative for syncope.       Objective:   Physical Exam  Constitutional: She is oriented to person, place, and time. She appears well-developed and well-nourished. No distress.  HENT:  Head: Normocephalic and atraumatic.  Eyes: EOM are normal. Pupils are equal, round, and reactive to light. Left eye exhibits no discharge.  Neck: Normal range of motion. Neck supple. No thyromegaly present.  Cardiovascular: Normal rate and regular rhythm.  Exam reveals no gallop and no friction rub.   No murmur heard. Pulmonary/Chest: Effort normal and breath sounds normal. No respiratory distress. She has no wheezes. She has no rales.  Abdominal: Soft. Bowel sounds are normal. She exhibits no distension. There is no tenderness. There is no rebound.  Musculoskeletal: She exhibits no edema or tenderness.  Neurological: She is alert and oriented to person, place, and time. No cranial nerve deficit.  Skin: Skin is warm and dry. No rash noted.  Psychiatric: She has a normal mood and affect.  Thought content normal.      Assessment & Plan:  Please refer to separate problem-list charting for more details.

## 2014-07-28 NOTE — Assessment & Plan Note (Signed)
Patient with overgrown toenails with evidence of onychomycosis. -Referral back to podiatry.

## 2014-07-28 NOTE — Assessment & Plan Note (Signed)
Patient recently evaluated by Dr. Benson Norway of GI. EGD was unremarkable. Colonoscopy was remarkable for a 20 mm polyp in the cecum and 3 sessile polyps in the descending colon at the hepatic flexure and descending colon, 3-4 mm in size. Patient was recommended to have a return to clinic in 4 weeks for GI and a repeat colonoscopy in 6 months for surveillance of the cecal resection site. -Continue with protonix and Zofran as needed

## 2014-07-29 NOTE — Progress Notes (Signed)
Internal Medicine Clinic Attending Date of Visit: 07/28/2014  Case discussed with Dr. Raelene Bott at the time of the visit.  We reviewed the resident's history and exam and pertinent patient test results.  I agree with the assessment, diagnosis, and plan of care documented in the resident's note.

## 2014-08-14 ENCOUNTER — Other Ambulatory Visit: Payer: Self-pay | Admitting: Oncology

## 2014-08-28 ENCOUNTER — Other Ambulatory Visit: Payer: Self-pay | Admitting: Internal Medicine

## 2014-08-28 ENCOUNTER — Other Ambulatory Visit: Payer: Self-pay | Admitting: *Deleted

## 2014-08-28 NOTE — Telephone Encounter (Signed)
complete

## 2014-09-16 ENCOUNTER — Other Ambulatory Visit: Payer: Self-pay | Admitting: Internal Medicine

## 2014-10-14 ENCOUNTER — Other Ambulatory Visit: Payer: Self-pay | Admitting: Internal Medicine

## 2014-10-23 ENCOUNTER — Encounter (HOSPITAL_COMMUNITY): Payer: Self-pay | Admitting: Emergency Medicine

## 2014-10-23 ENCOUNTER — Emergency Department (HOSPITAL_COMMUNITY): Payer: Medicare Other

## 2014-10-23 ENCOUNTER — Observation Stay (HOSPITAL_COMMUNITY)
Admission: EM | Admit: 2014-10-23 | Discharge: 2014-10-24 | Disposition: A | Discharge status: Home / Self Care | Payer: Medicare Other | Attending: Internal Medicine | Admitting: Internal Medicine

## 2014-10-23 DIAGNOSIS — E785 Hyperlipidemia, unspecified: Secondary | ICD-10-CM | POA: Insufficient documentation

## 2014-10-23 DIAGNOSIS — R42 Dizziness and giddiness: Principal | ICD-10-CM

## 2014-10-23 DIAGNOSIS — R0789 Other chest pain: Secondary | ICD-10-CM | POA: Diagnosis not present

## 2014-10-23 DIAGNOSIS — D509 Iron deficiency anemia, unspecified: Secondary | ICD-10-CM | POA: Diagnosis not present

## 2014-10-23 DIAGNOSIS — H409 Unspecified glaucoma: Secondary | ICD-10-CM | POA: Insufficient documentation

## 2014-10-23 DIAGNOSIS — I5020 Unspecified systolic (congestive) heart failure: Secondary | ICD-10-CM | POA: Diagnosis present

## 2014-10-23 DIAGNOSIS — M545 Low back pain, unspecified: Secondary | ICD-10-CM

## 2014-10-23 DIAGNOSIS — G8929 Other chronic pain: Secondary | ICD-10-CM | POA: Diagnosis not present

## 2014-10-23 DIAGNOSIS — I2510 Atherosclerotic heart disease of native coronary artery without angina pectoris: Secondary | ICD-10-CM | POA: Insufficient documentation

## 2014-10-23 DIAGNOSIS — H6121 Impacted cerumen, right ear: Secondary | ICD-10-CM | POA: Diagnosis present

## 2014-10-23 DIAGNOSIS — I5042 Chronic combined systolic (congestive) and diastolic (congestive) heart failure: Secondary | ICD-10-CM | POA: Diagnosis present

## 2014-10-23 DIAGNOSIS — E1140 Type 2 diabetes mellitus with diabetic neuropathy, unspecified: Secondary | ICD-10-CM | POA: Diagnosis not present

## 2014-10-23 DIAGNOSIS — I10 Essential (primary) hypertension: Secondary | ICD-10-CM | POA: Diagnosis present

## 2014-10-23 DIAGNOSIS — E11319 Type 2 diabetes mellitus with unspecified diabetic retinopathy without macular edema: Secondary | ICD-10-CM | POA: Insufficient documentation

## 2014-10-23 DIAGNOSIS — M5440 Lumbago with sciatica, unspecified side: Secondary | ICD-10-CM | POA: Insufficient documentation

## 2014-10-23 DIAGNOSIS — Z794 Long term (current) use of insulin: Secondary | ICD-10-CM | POA: Insufficient documentation

## 2014-10-23 DIAGNOSIS — E1165 Type 2 diabetes mellitus with hyperglycemia: Secondary | ICD-10-CM | POA: Diagnosis not present

## 2014-10-23 DIAGNOSIS — I951 Orthostatic hypotension: Secondary | ICD-10-CM

## 2014-10-23 DIAGNOSIS — K219 Gastro-esophageal reflux disease without esophagitis: Secondary | ICD-10-CM | POA: Diagnosis present

## 2014-10-23 DIAGNOSIS — D649 Anemia, unspecified: Secondary | ICD-10-CM | POA: Diagnosis present

## 2014-10-23 DIAGNOSIS — M549 Dorsalgia, unspecified: Secondary | ICD-10-CM | POA: Insufficient documentation

## 2014-10-23 DIAGNOSIS — R2681 Unsteadiness on feet: Secondary | ICD-10-CM

## 2014-10-23 DIAGNOSIS — R079 Chest pain, unspecified: Secondary | ICD-10-CM

## 2014-10-23 DIAGNOSIS — IMO0002 Reserved for concepts with insufficient information to code with codable children: Secondary | ICD-10-CM | POA: Diagnosis present

## 2014-10-23 LAB — CBC WITH DIFFERENTIAL/PLATELET
Basophils Absolute: 0 10*3/uL (ref 0.0–0.1)
Basophils Relative: 0 % (ref 0–1)
EOS ABS: 0.3 10*3/uL (ref 0.0–0.7)
Eosinophils Relative: 2 % (ref 0–5)
HCT: 35.6 % — ABNORMAL LOW (ref 36.0–46.0)
HEMOGLOBIN: 11.5 g/dL — AB (ref 12.0–15.0)
Lymphocytes Relative: 35 % (ref 12–46)
Lymphs Abs: 4.5 10*3/uL — ABNORMAL HIGH (ref 0.7–4.0)
MCH: 28.4 pg (ref 26.0–34.0)
MCHC: 32.3 g/dL (ref 30.0–36.0)
MCV: 87.9 fL (ref 78.0–100.0)
MONO ABS: 0.9 10*3/uL (ref 0.1–1.0)
Monocytes Relative: 7 % (ref 3–12)
Neutro Abs: 7.3 10*3/uL (ref 1.7–7.7)
Neutrophils Relative %: 56 % (ref 43–77)
Platelets: 295 10*3/uL (ref 150–400)
RBC: 4.05 MIL/uL (ref 3.87–5.11)
RDW: 14.1 % (ref 11.5–15.5)
WBC: 13 10*3/uL — ABNORMAL HIGH (ref 4.0–10.5)

## 2014-10-23 LAB — URINALYSIS, ROUTINE W REFLEX MICROSCOPIC
Bilirubin Urine: NEGATIVE
Glucose, UA: NEGATIVE mg/dL
Hgb urine dipstick: NEGATIVE
Ketones, ur: NEGATIVE mg/dL
Nitrite: NEGATIVE
Protein, ur: NEGATIVE mg/dL
Specific Gravity, Urine: 1.01 (ref 1.005–1.030)
Urobilinogen, UA: 0.2 mg/dL (ref 0.0–1.0)
pH: 5 (ref 5.0–8.0)

## 2014-10-23 LAB — I-STAT TROPONIN, ED
Troponin i, poc: 0.01 ng/mL (ref 0.00–0.08)
Troponin i, poc: 0 ng/mL (ref 0.00–0.08)

## 2014-10-23 LAB — BASIC METABOLIC PANEL
ANION GAP: 11 (ref 5–15)
BUN: 17 mg/dL (ref 6–23)
CO2: 26 mmol/L (ref 19–32)
Calcium: 9.4 mg/dL (ref 8.4–10.5)
Chloride: 99 mmol/L (ref 96–112)
Creatinine, Ser: 0.9 mg/dL (ref 0.50–1.10)
GFR calc Af Amer: 82 mL/min — ABNORMAL LOW (ref >90)
GFR, EST NON AFRICAN AMERICAN: 71 mL/min — AB (ref >90)
Glucose, Bld: 188 mg/dL — ABNORMAL HIGH (ref 70–99)
POTASSIUM: 4 mmol/L (ref 3.5–5.1)
SODIUM: 136 mmol/L (ref 135–145)

## 2014-10-23 LAB — BRAIN NATRIURETIC PEPTIDE: B Natriuretic Peptide: 16 pg/mL (ref 0.0–100.0)

## 2014-10-23 LAB — URINE MICROSCOPIC-ADD ON

## 2014-10-23 MED ORDER — ONDANSETRON 4 MG PO TBDP
ORAL_TABLET | ORAL | Status: AC
  Filled 2014-10-23: qty 2

## 2014-10-23 MED ORDER — ONDANSETRON 4 MG PO TBDP
8.0000 mg | ORAL_TABLET | Freq: Once | ORAL | Status: AC
  Administered 2014-10-23: 8 mg via ORAL

## 2014-10-23 MED ORDER — MORPHINE SULFATE 4 MG/ML IJ SOLN
4.0000 mg | Freq: Once | INTRAMUSCULAR | Status: AC
  Administered 2014-10-23: 4 mg via INTRAVENOUS
  Filled 2014-10-23: qty 1

## 2014-10-23 MED ORDER — MECLIZINE HCL 25 MG PO TABS
25.0000 mg | ORAL_TABLET | Freq: Once | ORAL | Status: AC
  Administered 2014-10-23: 25 mg via ORAL
  Filled 2014-10-23: qty 1

## 2014-10-23 MED ORDER — SODIUM CHLORIDE 0.9 % IV BOLUS (SEPSIS)
1000.0000 mL | Freq: Once | INTRAVENOUS | Status: AC
  Administered 2014-10-23: 1000 mL via INTRAVENOUS

## 2014-10-23 NOTE — ED Notes (Signed)
Discussed patient's dizzy sensation during orthostatic vitals, and provided readings to [place], [place]. Also reported patient's request for pain medication for her back. She acknowledges, awaiting orders.

## 2014-10-23 NOTE — ED Notes (Signed)
Pt sts CP, back pain and HA x 2 days worse today with nausea

## 2014-10-23 NOTE — ED Provider Notes (Signed)
CSN: [id number]     Arrival date & time 10/23/14  1739 History   First MD Initiated Contact with Patient 10/23/14 2107     Chief Complaint  Patient presents with  . Chest Pain  . Back Pain     (Consider location/radiation/quality/duration/timing/severity/associated sxs/prior Treatment) HPI  Pt is a 54yo female with hx of hypertension, hyperlipidemia, GERD, peripheral neuropathy, diabetic retinopathy, glaucoma, CAD, and CHF, presenting to ED with reports of gradually worsening posterior headache with associated dizziness, nausea, chest pain and back pain.  Symptoms started 2 days ago after she woke up in the morning. Dizziness is described as the room spinning.  Pt states when she stands, she must grab onto things so she does not fall. Pt constantly feels like she is going to fall due to the severe dizziness. Headache is aching and sore, worse in occipital region, 10/10.  Pt also c/o 10/10 aching sore back pain.  Pt reports hx of back pain and sciatica but states this is more severe.  Denies recent falls or head injuries.  Pt denies hx of migraines or severe headaches.  Pt initially denied similar symptoms but then stated she had an MRI 2 years ago for dizziness.  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 Denies using meclizine or antivert.    Past Medical History  Diagnosis Date  . Hypertension   . Hyperlipidemia   . GERD (gastroesophageal reflux disease)   . Adhesive capsulitis of shoulder     bilateral, Steroid injection Dr. Truman Hayward 1/12 bilaterally  . Peripheral neuropathy   . Adhesive capsulitis of right shoulder     Steroid injection Dr. Truman Hayward 1/12  . Adhesive capsulitis of left shoulder     Steroid injection Dr. Truman Hayward 1/12  . Obesity   . Diabetic retinopathy   . Glaucoma   . Diabetes mellitus     Type II, insulin dependent  . CAD (coronary artery disease)     nonobstructive. Last cardiac cath (2008) showing left  circumflex with mid 50% stenosis and distal luminal irregularities. Also with RCA with mid to distal 30-40% stenosis. // Previously evaluated by [name], never followed up outpatient.  . CHF (congestive heart failure)   . CAP (community acquired pneumonia) 06/15/2012    05/2012 CXR: Mild opacification of the posterior lung base on the lateral film  as cannot exclude infection/atelectasis.     Past Surgical History  Procedure Laterality Date  . Cataract extraction    . Glaucoma surgery    . Cardiac catheterization    . Tubal ligation    . Left heart catheterization with coronary angiogram N/A 01/24/2012    Procedure: LEFT HEART CATHETERIZATION WITH CORONARY ANGIOGRAM;  Surgeon: Sherren Mocha, MD;  Location: [facility] CATH LAB;  Service: Cardiovascular;  Laterality: N/A;   Family History  Problem Relation Age of Onset  . Hypertension Sister   . Hypertension Sister   . Hypertension Sister    History  Substance Use Topics  . Smoking status: Former Smoker -- 0.30 packs/day for 7 years    Types: Cigarettes    Quit date: 06/27/2006  . Smokeless tobacco: Never Used  . Alcohol Use: No   OB History    No data available     Review of Systems  Constitutional: Negative for fever, chills, diaphoresis, appetite change and fatigue.  Respiratory: Negative for cough and shortness of breath.   Cardiovascular: Positive for chest pain.  Gastrointestinal: Negative for  nausea, vomiting, abdominal pain, diarrhea and constipation.  Neurological: Positive for dizziness, light-headedness, numbness ( left hand) and headaches. Negative for tremors, seizures, syncope, speech difficulty and weakness.  All other systems reviewed and are negative.     Allergies  Review of patient's allergies indicates no known allergies.  Home Medications   Prior to Admission medications   Medication Sig Start Date End Date Taking? Authorizing Provider  amitriptyline (ELAVIL) 25 MG tablet TAKE 1 TABLET BY MOUTH  EVERY NIGHT AT BEDTIME Patient taking differently: Take one tab qhs 08/28/14  Yes Luan Moore, MD  aspirin EC 325 MG EC tablet Take 1 tablet (325 mg total) by mouth daily. 08/23/13  Yes Gary Fleet, PA-C  Blood Glucose Monitoring Suppl (ONETOUCH VERIO IQ SYSTEM) W/DEVICE KIT Check blood sugar as instructed up to 3 times a day 02/26/14  Yes Ejiroghene E Emokpae, MD  carvedilol (COREG) 6.25 MG tablet Take 1 tablet (6.25 mg total) by mouth 2 (two) times daily. 03/11/13  Yes Sherren Mocha, MD  cyclobenzaprine (FLEXERIL) 5 MG tablet TAKE 1 TABLET BY MOUTH EVERY 8 HOURS AS NEEDED FOR MUSCLE SPASMS 08/28/14  Yes Luan Moore, MD  dorzolamide-timolol (COSOPT) 22.3-6.8 MG/ML ophthalmic solution Place 1 drop into the right eye 2 (two) times daily.   Yes Historical Provider, MD  ferrous sulfate 325 (65 FE) MG tablet Take 1 tablet (325 mg total) by mouth daily with breakfast. 06/15/12  Yes Dorian Heckle, MD  furosemide (LASIX) 40 MG tablet Take 0.5 tablets (20 mg total) by mouth daily. 04/04/14  Yes Luan Moore, MD  gabapentin (NEURONTIN) 300 MG capsule TAKE 1 CAPSULE BY MOUTH TWICE DAILY 10/14/14  Yes Luan Moore, MD  glucose blood (ONETOUCH VERIO) test strip Check blood sugar as instructed up to 3 times a day 02/26/14  Yes Ejiroghene E Emokpae, MD  HYDROcodone-acetaminophen (NORCO/VICODIN) 5-325 MG per tablet Take 2 tablets by mouth every 6 (six) hours as needed for moderate pain (pain).   Yes Historical Provider, MD  Insulin Glargine (LANTUS SOLOSTAR) 100 UNIT/ML Solostar Pen Inject 44 Units into the skin at bedtime.   Yes Historical Provider, MD  Insulin Pen Needle (B-D UF III MINI PEN NEEDLES) 31G X 5 MM MISC Use to inject insulin 4-5 times daily   Yes Madilyn Fireman, MD  lisinopril (PRINIVIL,ZESTRIL) 20 MG tablet Take 0.5 tablets (10 mg total) by mouth daily. 04/04/14  Yes Luan Moore, MD  metFORMIN (GLUCOPHAGE) 1000 MG tablet TAKE 1 TABLET BY MOUTH TWICE DAILY WITH MEALS 04/07/14  Yes Luan Moore, MD   NOVOLOG FLEXPEN 100 UNIT/ML FlexPen INJECT 4-6 UNITS INTO THE SKIN THREE TIMES DAILY BEFORE MEALS. 4 UNITS IN THE MORNING AND 6 UNITS IN THE EVENING Patient taking differently: INJECT 5-6 UNITS INTO THE SKIN THREE TIMES DAILY BEFORE MEALS.  Sliding Scale 05/04/14  Yes Luan Moore, MD  [name] DELICA LANCETS FINE MISC Check blood sugar as instructed up to 3 times a day 02/26/14  Yes Ejiroghene E Emokpae, MD  pantoprazole (PROTONIX) 20 MG tablet Take 1 tablet (20 mg total) by mouth daily. 07/10/14  Yes Luan Moore, MD  pravastatin (PRAVACHOL) 20 MG tablet TAKE 1 TABLET BY MOUTH EVERY DAY 03/05/14  Yes Bartholomew Crews, MD  triamcinolone cream (KENALOG) 0.1 % Apply 1 application topically 2 (two) times daily. Patient taking differently: Apply 1 application topically 2 (two) times daily as needed (Skin condition flare up).  03/21/14  Yes Clayton Bibles, PA-C  naproxen (NAPROSYN) 500 MG tablet Take 1 tablet (500 mg  total) by mouth 2 (two) times daily with a meal. Patient not taking: Reported on 10/23/2014 07/11/14   Lucious Groves, DO  ondansetron (ZOFRAN) 4 MG tablet Take 1 tablet (4 mg total) by mouth every 8 (eight) hours as needed for nausea or vomiting. Patient not taking: Reported on 10/23/2014 05/21/14   Luan Moore, MD   BP 163/61 mmHg  Pulse 88  Temp(Src) 98.3 F (36.8 C) (Oral)  Resp 18  SpO2 100%  LMP 02/13/2011 Physical Exam  Constitutional: She is oriented to person, place, and time. She appears well-developed and well-nourished. No distress.  Pt lying in exam bed, NAD  HENT:  Head: Normocephalic and atraumatic.  Eyes: Conjunctivae and EOM are normal. No scleral icterus.  Pupils: decreased reactivity, symmetrical. S/p cataract surgery. EOM normal.   Neck: Normal range of motion. Neck supple.  Cardiovascular: Normal rate, regular rhythm and normal heart sounds.   Pulmonary/Chest: Effort normal and breath sounds normal. No respiratory distress. She has no wheezes. She has no rales. She  exhibits no tenderness.  Abdominal: Soft. Bowel sounds are normal. She exhibits no distension and no mass. There is no tenderness. There is no rebound and no guarding.  Musculoskeletal: Normal range of motion.  Neurological: She is alert and oriented to person, place, and time. She has normal strength. No cranial nerve deficit or sensory deficit. Gait abnormal. Coordination normal. GCS eye subscore is 4. GCS verbal subscore is 5. GCS motor subscore is 6.  Per alert to person, place and time. CN II-XII in tact. Speech is fluent and clear. No pronator drift. Reproducible "dizziness" when pt sits from lying and from sitting to standing. Unsteady stance, unsafe to ambulate more than 2 steps.   Skin: Skin is warm and dry. She is not diaphoretic.  Nursing note and vitals reviewed.   ED Course  Procedures (including critical care time) Labs Review Labs Reviewed  BASIC METABOLIC PANEL - Abnormal; Notable for the following:    Glucose, Bld 188 (*)    GFR calc non Af Amer 71 (*)    GFR calc Af Amer 82 (*)    All other components within normal limits  CBC WITH DIFFERENTIAL/PLATELET - Abnormal; Notable for the following:    WBC 13.0 (*)    Hemoglobin 11.5 (*)    HCT 35.6 (*)    Lymphs Abs 4.5 (*)    All other components within normal limits  URINALYSIS, ROUTINE W REFLEX MICROSCOPIC - Abnormal; Notable for the following:    Leukocytes, UA TRACE (*)    All other components within normal limits  URINE MICROSCOPIC-ADD ON - Abnormal; Notable for the following:    Squamous Epithelial / LPF FEW (*)    All other components within normal limits  BRAIN NATRIURETIC PEPTIDE  I-STAT TROPOININ, ED  Randolm Idol, ED    Imaging Review Dg Chest 2 View  10/23/2014   CLINICAL DATA:  Back pain and headache, dizziness 2 days.  EXAM: CHEST  2 VIEW  COMPARISON:  07/22/2013  FINDINGS: Normal mediastinum and cardiac silhouette. Normal pulmonary vasculature. No evidence of effusion, infiltrate, or pneumothorax.  No acute bony abnormality.  IMPRESSION: No acute cardiopulmonary process.   Electronically Signed   By: Suzy Bouchard M.D.   On: 10/23/2014 19:29   Ct Head Wo Contrast  10/23/2014   CLINICAL DATA:  Dizziness, nausea, upper back pain  EXAM: CT HEAD WITHOUT CONTRAST  TECHNIQUE: Contiguous axial images were obtained from the base of the skull through the vertex  without intravenous contrast.  COMPARISON:  None.  FINDINGS: No acute intracranial hemorrhage. No focal mass lesion. No CT evidence of acute infarction. No midline shift or mass effect. No hydrocephalus. Basilar cisterns are patent.  Paranasal sinuses and  mastoid air cells are clear.  IMPRESSION: No acute intracranial findings.  Normal head CT for age   Electronically Signed   By: Suzy Bouchard M.D.   On: 10/23/2014 22:51     EKG Interpretation   Date/Time:  Thursday October 23 2014 17:46:06 EDT Ventricular Rate:  89 PR Interval:  172 QRS Duration: 84 QT Interval:  368 QTC Calculation: 447 R Axis:   44 Text Interpretation:  Normal sinus rhythm Normal ECG No significant change  was found Confirmed by [facility]  MD, TREY ([id number]) on 10/23/2014 11:19:05 PM      MDM   Final diagnoses:  Dizziness  Unsteady gait  Chest pain, unspecified chest pain type  Bilateral low back pain without sciatica   Pt is a 54yo female presenting to ED with reports of new onset severe posterior headache that was gradual in onset, associated dizziness described as room spinning, as well as nausea, chest pain and back pain. 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  Due to reports of new onset headache and hx of previous abnormal MRI, will start with a CT head. Low concern for [facility] given gradual onset of symptoms. No focal neuro deficit, low concern for CVA, however, will r/o slow intracranial bleed, mass affect, or other acute cause of pt's symptoms.   Pt has symptomatic  orthostatic hypotension. BP dropped from 152/70 while sitting to 124/80 standing.  Pt reported feeling dizzy and began to sway during assessment.    Delta troponin: negative for elevation   11:06 PM  Pt reports mild improvement in her dizziness, best when lying down and resting, worse sitting up and moving around while in CT.  Pt reports mild CP, states it feels like acid reflux.  CP is 4/10. Back pain down from 10/10 to 8/10.  Pt reports hx of sciatica, has had flexeril in the past which has helped.  Discussed pt with Dr. Doy Mince who will go examine pt.  Upon exam by Dr. Doy Mince, pt still cannot stand w/o swaying.  Pt states dizziness is her main concern today, not the chest pain or back pain.  Will consult with neurology.  Pt did mention she had episode of diarrhea a few days ago when symptoms started and she had diarrhea during last episode of dizziness 2 years ago.    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 No significant intracranial stenosis.  11:42 PM Consulted with Dr. Leonel Ramsay, neurology, states MRI from 2012 is likely not contributory to pt's current symptoms.  Pt most likely experiencing orthostatic hypotensive as "dizziness" improves while at rest, it is not constant.  Recommends finishing IV fluids. If pt still unable to ambulate, order and MRI.   12:37 AM Pt still unable to ambulate around department w/o assistance.  Will order MRI and admit pt for persistent dizziness, unsteady gait. PCP: Dr. Raelene Bott  12:57 AM Consulted with internal medicine who agreed to come evaluate pt and will place admission orders.      Noland Fordyce, PA-C 10/24/14 0100  Serita Grit, MD 10/25/14 1215

## 2014-10-23 NOTE — ED Notes (Signed)
Dr. Doy Mince at the bedside.

## 2014-10-23 NOTE — ED Notes (Signed)
Erin, PA-C, at the bedside.

## 2014-10-24 ENCOUNTER — Emergency Department (HOSPITAL_COMMUNITY): Payer: Medicare Other

## 2014-10-24 ENCOUNTER — Encounter (HOSPITAL_COMMUNITY): Payer: Self-pay | Admitting: *Deleted

## 2014-10-24 ENCOUNTER — Other Ambulatory Visit: Payer: Self-pay | Admitting: Internal Medicine

## 2014-10-24 DIAGNOSIS — Z79899 Other long term (current) drug therapy: Secondary | ICD-10-CM

## 2014-10-24 DIAGNOSIS — K219 Gastro-esophageal reflux disease without esophagitis: Secondary | ICD-10-CM

## 2014-10-24 DIAGNOSIS — R0789 Other chest pain: Secondary | ICD-10-CM

## 2014-10-24 DIAGNOSIS — I951 Orthostatic hypotension: Secondary | ICD-10-CM | POA: Diagnosis not present

## 2014-10-24 DIAGNOSIS — R42 Dizziness and giddiness: Secondary | ICD-10-CM

## 2014-10-24 DIAGNOSIS — I509 Heart failure, unspecified: Secondary | ICD-10-CM

## 2014-10-24 DIAGNOSIS — G8929 Other chronic pain: Secondary | ICD-10-CM | POA: Diagnosis not present

## 2014-10-24 DIAGNOSIS — H409 Unspecified glaucoma: Secondary | ICD-10-CM

## 2014-10-24 DIAGNOSIS — I2510 Atherosclerotic heart disease of native coronary artery without angina pectoris: Secondary | ICD-10-CM

## 2014-10-24 DIAGNOSIS — D509 Iron deficiency anemia, unspecified: Secondary | ICD-10-CM

## 2014-10-24 DIAGNOSIS — Z87891 Personal history of nicotine dependence: Secondary | ICD-10-CM

## 2014-10-24 DIAGNOSIS — Z794 Long term (current) use of insulin: Secondary | ICD-10-CM

## 2014-10-24 DIAGNOSIS — R079 Chest pain, unspecified: Secondary | ICD-10-CM | POA: Insufficient documentation

## 2014-10-24 DIAGNOSIS — M5440 Lumbago with sciatica, unspecified side: Secondary | ICD-10-CM | POA: Insufficient documentation

## 2014-10-24 DIAGNOSIS — M549 Dorsalgia, unspecified: Secondary | ICD-10-CM

## 2014-10-24 DIAGNOSIS — E11319 Type 2 diabetes mellitus with unspecified diabetic retinopathy without macular edema: Secondary | ICD-10-CM

## 2014-10-24 LAB — IRON AND TIBC
Iron: 57 ug/dL (ref 42–145)
SATURATION RATIOS: 19 % — AB (ref 20–55)
TIBC: 303 ug/dL (ref 250–470)
UIBC: 246 ug/dL (ref 125–400)

## 2014-10-24 LAB — CBC WITH DIFFERENTIAL/PLATELET
BASOS PCT: 0 % (ref 0–1)
Basophils Absolute: 0 10*3/uL (ref 0.0–0.1)
EOS ABS: 0.2 10*3/uL (ref 0.0–0.7)
Eosinophils Relative: 2 % (ref 0–5)
HEMATOCRIT: 33.5 % — AB (ref 36.0–46.0)
HEMOGLOBIN: 10.7 g/dL — AB (ref 12.0–15.0)
Lymphocytes Relative: 30 % (ref 12–46)
Lymphs Abs: 3 10*3/uL (ref 0.7–4.0)
MCH: 27.7 pg (ref 26.0–34.0)
MCHC: 31.9 g/dL (ref 30.0–36.0)
MCV: 86.8 fL (ref 78.0–100.0)
MONO ABS: 0.6 10*3/uL (ref 0.1–1.0)
MONOS PCT: 5 % (ref 3–12)
Neutro Abs: 6.4 10*3/uL (ref 1.7–7.7)
Neutrophils Relative %: 63 % (ref 43–77)
PLATELETS: 266 10*3/uL (ref 150–400)
RBC: 3.86 MIL/uL — AB (ref 3.87–5.11)
RDW: 14 % (ref 11.5–15.5)
WBC: 10.1 10*3/uL (ref 4.0–10.5)

## 2014-10-24 LAB — COMPREHENSIVE METABOLIC PANEL
ALBUMIN: 3.5 g/dL (ref 3.5–5.2)
ALK PHOS: 71 U/L (ref 39–117)
ALT: 17 U/L (ref 0–35)
AST: 17 U/L (ref 0–37)
Anion gap: 9 (ref 5–15)
BILIRUBIN TOTAL: 0.5 mg/dL (ref 0.3–1.2)
BUN: 13 mg/dL (ref 6–23)
CHLORIDE: 100 mmol/L (ref 96–112)
CO2: 28 mmol/L (ref 19–32)
CREATININE: 1.02 mg/dL (ref 0.50–1.10)
Calcium: 9.1 mg/dL (ref 8.4–10.5)
GFR calc non Af Amer: 61 mL/min — ABNORMAL LOW (ref >90)
GFR, EST AFRICAN AMERICAN: 70 mL/min — AB (ref >90)
Glucose, Bld: 279 mg/dL — ABNORMAL HIGH (ref 70–99)
Potassium: 3.7 mmol/L (ref 3.5–5.1)
Sodium: 137 mmol/L (ref 135–145)
Total Protein: 6.9 g/dL (ref 6.0–8.3)

## 2014-10-24 LAB — GLUCOSE, CAPILLARY
GLUCOSE-CAPILLARY: 264 mg/dL — AB (ref 70–99)
Glucose-Capillary: 253 mg/dL — ABNORMAL HIGH (ref 70–99)
Glucose-Capillary: 274 mg/dL — ABNORMAL HIGH (ref 70–99)

## 2014-10-24 LAB — RETICULOCYTES
RBC.: 3.85 MIL/uL — AB (ref 3.87–5.11)
RETIC COUNT ABSOLUTE: 53.9 10*3/uL (ref 19.0–186.0)
Retic Ct Pct: 1.4 % (ref 0.4–3.1)

## 2014-10-24 LAB — FERRITIN: Ferritin: 161 ng/mL (ref 10–291)

## 2014-10-24 LAB — FOLATE: Folate: 11.7 ng/mL

## 2014-10-24 LAB — VITAMIN B12: Vitamin B-12: 580 pg/mL (ref 211–911)

## 2014-10-24 LAB — TROPONIN I: Troponin I: <0.03 ng/mL (ref <0.031)

## 2014-10-24 MED ORDER — GADOBENATE DIMEGLUMINE 529 MG/ML IV SOLN
20.0000 mL | Freq: Once | INTRAVENOUS | Status: AC | PRN
  Administered 2014-10-24: 20 mL via INTRAVENOUS

## 2014-10-24 MED ORDER — DORZOLAMIDE HCL-TIMOLOL MAL 2-0.5 % OP SOLN
1.0000 [drp] | Freq: Two times a day (BID) | OPHTHALMIC | Status: DC
  Administered 2014-10-24: 1 [drp] via OPHTHALMIC
  Filled 2014-10-24: qty 10

## 2014-10-24 MED ORDER — MECLIZINE HCL 50 MG PO TABS: 50.0000 mg | ORAL_TABLET | Freq: Two times a day (BID) | ORAL | Status: DC

## 2014-10-24 MED ORDER — CARBAMIDE PEROXIDE 6.5 % OT SOLN: 5.0000 [drp] | Freq: Two times a day (BID) | OTIC | Status: AC

## 2014-10-24 MED ORDER — LISINOPRIL 10 MG PO TABS: 10.0000 mg | ORAL_TABLET | Freq: Every day | ORAL | Status: DC

## 2014-10-24 MED ORDER — PRAVASTATIN SODIUM 20 MG PO TABS
20.0000 mg | ORAL_TABLET | Freq: Every day | ORAL | Status: DC
  Filled 2014-10-24: qty 1

## 2014-10-24 MED ORDER — SODIUM CHLORIDE 0.9 % IJ SOLN: 3.0000 mL | Freq: Two times a day (BID) | INTRAMUSCULAR | Status: DC

## 2014-10-24 MED ORDER — CARBAMIDE PEROXIDE 6.5 % OT SOLN
5.0000 [drp] | Freq: Once | OTIC | Status: AC
  Administered 2014-10-24: 5 [drp] via OTIC
  Filled 2014-10-24: qty 15

## 2014-10-24 MED ORDER — SODIUM CHLORIDE 0.9 % IV SOLN
INTRAVENOUS | Status: DC
  Administered 2014-10-24: 05:00:00 via INTRAVENOUS

## 2014-10-24 MED ORDER — HYDROCODONE-ACETAMINOPHEN 5-325 MG PO TABS
1.0000 | ORAL_TABLET | Freq: Four times a day (QID) | ORAL | Status: DC | PRN
  Administered 2014-10-24: 1 via ORAL
  Filled 2014-10-24: qty 1

## 2014-10-24 MED ORDER — MECLIZINE HCL 25 MG PO TABS
50.0000 mg | ORAL_TABLET | Freq: Two times a day (BID) | ORAL | Status: DC
  Administered 2014-10-24: 50 mg via ORAL
  Filled 2014-10-24 (×2): qty 2

## 2014-10-24 MED ORDER — FERROUS SULFATE 325 (65 FE) MG PO TABS: 325.0000 mg | ORAL_TABLET | Freq: Every day | ORAL | Status: DC

## 2014-10-24 MED ORDER — SODIUM CHLORIDE 0.9 % IJ SOLN
3.0000 mL | Freq: Two times a day (BID) | INTRAMUSCULAR | Status: DC
  Administered 2014-10-24: 3 mL via INTRAVENOUS

## 2014-10-24 MED ORDER — INSULIN ASPART 100 UNIT/ML ~~LOC~~ SOLN
0.0000 [IU] | Freq: Three times a day (TID) | SUBCUTANEOUS | Status: DC
  Administered 2014-10-24 (×2): 8 [IU] via SUBCUTANEOUS

## 2014-10-24 MED ORDER — CARVEDILOL 6.25 MG PO TABS: 6.2500 mg | ORAL_TABLET | Freq: Two times a day (BID) | ORAL | Status: DC

## 2014-10-24 MED ORDER — PANTOPRAZOLE SODIUM 20 MG PO TBEC
20.0000 mg | DELAYED_RELEASE_TABLET | Freq: Every day | ORAL | Status: DC
  Administered 2014-10-24: 20 mg via ORAL
  Filled 2014-10-24: qty 1

## 2014-10-24 MED ORDER — GABAPENTIN 300 MG PO CAPS
300.0000 mg | ORAL_CAPSULE | Freq: Two times a day (BID) | ORAL | Status: DC
Start: 2014-10-24 — End: 2014-10-24
  Administered 2014-10-24: 300 mg via ORAL
  Filled 2014-10-24 (×2): qty 1

## 2014-10-24 MED ORDER — FERROUS SULFATE 325 (65 FE) MG PO TABS
325.0000 mg | ORAL_TABLET | Freq: Every day | ORAL | Status: DC
  Administered 2014-10-24: 325 mg via ORAL
  Filled 2014-10-24 (×2): qty 1

## 2014-10-24 MED ORDER — HEPARIN SODIUM (PORCINE) 5000 UNIT/ML IJ SOLN
5000.0000 [IU] | Freq: Three times a day (TID) | INTRAMUSCULAR | Status: DC
  Administered 2014-10-24: 5000 [IU] via SUBCUTANEOUS

## 2014-10-24 MED ORDER — CYCLOBENZAPRINE HCL 5 MG PO TABS: 5.0000 mg | ORAL_TABLET | Freq: Every day | ORAL | Status: DC

## 2014-10-24 MED ORDER — ASPIRIN EC 325 MG PO TBEC
325.0000 mg | DELAYED_RELEASE_TABLET | Freq: Every day | ORAL | Status: DC
  Administered 2014-10-24: 325 mg via ORAL
  Filled 2014-10-24: qty 1

## 2014-10-24 MED ORDER — INSULIN GLARGINE 100 UNIT/ML ~~LOC~~ SOLN
30.0000 [IU] | Freq: Every day | SUBCUTANEOUS | Status: DC
  Filled 2014-10-24: qty 0.3

## 2014-10-24 MED ORDER — PNEUMOCOCCAL VAC POLYVALENT 25 MCG/0.5ML IJ INJ: 0.5000 mL | INJECTION | INTRAMUSCULAR | Status: DC

## 2014-10-24 NOTE — Progress Notes (Signed)
Cardiac monitor discontinued. CCMD notified. Home discharge instructions and discharge medication discussed with patient. Discussed diet, activity, medications and follow appt. Patient verbally understands instructions by using teachback.

## 2014-10-24 NOTE — ED Notes (Signed)
Spoke to MRI, patient is ready for transport.

## 2014-10-24 NOTE — Progress Notes (Signed)
Patient transported to [facility] by stretcher from the ED.  No complaints of pain. Blood pressure slightly elevated 276 systolic.  Patient feeling dizzy.  Will start on IV fluids and continue to monitor.

## 2014-10-24 NOTE — Progress Notes (Signed)
PT Note PT evaluation completed and to be placed in chart.  Pt will need Outpt PT f/u for vestibular rehab.  Positive for Right BPPV which PT treated for but pt will need f/u.  Pt states she feels like she can go home with family support, equipment (she has all needed equipment) and the Outpt f/u.  Thanks.   [place] [phone] (pager)

## 2014-10-24 NOTE — Discharge Instructions (Signed)
It seems that your inner ear may be responsible for some of your symptoms of dizziness. We have done several tests that do not show any evidence for any more serious pathology such as a stroke.  To help with your dizziness, we have started a medication called meclizine. Further, I have prescribed ear drops that can help to clear out your ear wax. Additionally, we recommend that you stop your amitriptyline and take less of your Flexeril as both these medications can cause dizziness. Furthermore, our clinic will be in contact with you regarding outpatient vestibular physical therapy who can help to further do therapy for your dizziness.   Vertigo Vertigo means you feel like you are moving when you are not. Vertigo can make you feel like things around you are moving when they are not. This problem often goes away on its own.  HOME CARE   Follow your doctor's instructions.  Avoid driving.  Avoid using heavy machinery.  Avoid doing any activity that could be dangerous if you have a vertigo attack.  Tell your doctor if a medicine seems to cause your vertigo. GET HELP RIGHT AWAY IF:   Your medicines do not help or make you feel worse.  You have trouble talking or walking.  You feel weak or have trouble using your arms, hands, or legs.  You have bad headaches.  You keep feeling sick to your stomach (nauseous) or throwing up (vomiting).  Your vision changes.  A family member notices changes in your behavior.  Your problems get worse. MAKE SURE YOU:  Understand these instructions.  Will watch your condition.  Will get help right away if you are not doing well or get worse. Document Released: 03/22/2008 Document Revised: 09/05/2011 Document Reviewed: 12/30/2010 [facility] Patient Information 2015 [place], [place]. This information is not intended to replace advice given to you by your health care provider. Make sure you discuss any questions you have with your health care provider.

## 2014-10-24 NOTE — H&P (Signed)
Date: 10/24/2014               Patient Name:  [name] MRN: [id number]  DOB: 12-13-1960 Age / Sex: 54 y.o., female   PCP: Luan Moore, MD              Medical Service: Internal Medicine Teaching Service              Attending Physician: Dr. Sid Falcon, MD    First Contact: Dr. Raelene Bott Pager: [phone]  Second Contact: Dr. Gordy Levan Pager: [phone]            After Hours (After 5p/  First Contact Pager: [phone]  weekends / holidays): Second Contact Pager: [phone]   Chief Complaint:  Multiple complaints.  History of Present Illness: [name] is a 54 year old woman with history of hypertension, hyperlipidemia, type 2 diabetes with retinopathy, nonobstructive coronary artery disease, congestive heart failure, and GERD is ending with multiple complaints. She reports gradually worsening posterior headache associated with dizziness, nausea, chest pain, and back pain. She reports the symptoms started 2 days ago after waking up in the morning. She says that she feels as though the room is spinning, and when she stands she must hold onto something for support. The dizziness occurs at rest but is worse with movement. She says she feels like she is going to "fallout." She has had intermittent nausea, and she reports vomiting yesterday evening.  She had 4-6 episodes of diarrhea 3 days ago, and reports poor by mouth intake over the last day. She does endorse a feeling of fullness in her right ear associated with decreased hearing and tinnitus. However, this is been an ongoing issue for her.  She reports a 10 out of 10 headache in the occipital region of her head and 10 out of 10 back pain. She does have a history of back pain and sciatica, but she states that this pain is more severe and slightly higher. She endorses sharp left-sided chest pain as well. She denies any recent falls or injuries. She does say that she had similar symptoms 2 years ago and she was diagnosed with "narrow blood  vessels." She denies recent viral illness, double vision, focal weakness, or focal sensory change.  In the ER, the patient was noted to be orthostatic and head CT was unremarkable. She was given Zofran, meclizine, morphine, and 1 L normal saline.  Review of Systems: Review of Systems  Constitutional: Negative for fever, chills and malaise/fatigue.  HENT: Positive for hearing loss (right-sided) and tinnitus. Negative for congestion, ear discharge and sore throat.   Eyes: Negative for blurred vision, double vision and pain.  Respiratory: Negative for cough, sputum production and shortness of breath.   Cardiovascular: Positive for chest pain. Negative for palpitations, orthopnea and leg swelling.  Gastrointestinal: Positive for nausea and vomiting. Negative for abdominal pain, diarrhea and constipation.  Genitourinary: Negative for dysuria and urgency.  Musculoskeletal: Positive for myalgias, back pain and joint pain.  Skin: Negative for rash.  Neurological: Positive for dizziness and headaches. Negative for tremors, sensory change, focal weakness and weakness.    Meds:  (Not in a hospital admission) Current Facility-Administered Medications  Medication Dose Route Frequency Provider Last Rate Last Dose  . insulin aspart (novoLOG) injection 0-15 Units  0-15 Units Subcutaneous TID WC Francesca Oman, DO      . insulin glargine (LANTUS) injection 30 Units  30 Units Subcutaneous QHS Francesca Oman, DO  Current Outpatient Prescriptions  Medication Sig Dispense Refill  . amitriptyline (ELAVIL) 25 MG tablet TAKE 1 TABLET BY MOUTH EVERY NIGHT AT BEDTIME (Patient taking differently: Take one tab qhs) 90 tablet 0  . aspirin EC 325 MG EC tablet Take 1 tablet (325 mg total) by mouth daily. 30 tablet 0  . Blood Glucose Monitoring Suppl (ONETOUCH VERIO IQ SYSTEM) W/DEVICE KIT Check blood sugar as instructed up to 3 times a day 1 kit 0  . carvedilol (COREG) 6.25 MG tablet Take 1 tablet (6.25 mg total)  by mouth 2 (two) times daily. 60 tablet 11  . cyclobenzaprine (FLEXERIL) 5 MG tablet TAKE 1 TABLET BY MOUTH EVERY 8 HOURS AS NEEDED FOR MUSCLE SPASMS 270 tablet 1  . dorzolamide-timolol (COSOPT) 22.3-6.8 MG/ML ophthalmic solution Place 1 drop into the right eye 2 (two) times daily.    . ferrous sulfate 325 (65 FE) MG tablet Take 1 tablet (325 mg total) by mouth daily with breakfast. 30 tablet 11  . furosemide (LASIX) 40 MG tablet Take 0.5 tablets (20 mg total) by mouth daily. 30 tablet 3  . gabapentin (NEURONTIN) 300 MG capsule TAKE 1 CAPSULE BY MOUTH TWICE DAILY 60 capsule 0  . glucose blood (ONETOUCH VERIO) test strip Check blood sugar as instructed up to 3 times a day 100 each 12  . HYDROcodone-acetaminophen (NORCO/VICODIN) 5-325 MG per tablet Take 2 tablets by mouth every 6 (six) hours as needed for moderate pain (pain).    . Insulin Glargine (LANTUS SOLOSTAR) 100 UNIT/ML Solostar Pen Inject 44 Units into the skin at bedtime.    . Insulin Pen Needle (B-D UF III MINI PEN NEEDLES) 31G X 5 MM MISC Use to inject insulin 4-5 times daily 200 each 5  . lisinopril (PRINIVIL,ZESTRIL) 20 MG tablet Take 0.5 tablets (10 mg total) by mouth daily. 90 tablet 3  . metFORMIN (GLUCOPHAGE) 1000 MG tablet TAKE 1 TABLET BY MOUTH TWICE DAILY WITH MEALS 180 tablet 1  . NOVOLOG FLEXPEN 100 UNIT/ML FlexPen INJECT 4-6 UNITS INTO THE SKIN THREE TIMES DAILY BEFORE MEALS. 4 UNITS IN THE MORNING AND 6 UNITS IN THE EVENING (Patient taking differently: INJECT 5-6 UNITS INTO THE SKIN THREE TIMES DAILY BEFORE MEALS.  Sliding Scale) 15 mL 0  . ONETOUCH DELICA LANCETS FINE MISC Check blood sugar as instructed up to 3 times a day 100 each 12  . pantoprazole (PROTONIX) 20 MG tablet Take 1 tablet (20 mg total) by mouth daily. 90 tablet 3  . pravastatin (PRAVACHOL) 20 MG tablet TAKE 1 TABLET BY MOUTH EVERY DAY 30 tablet 5  . triamcinolone cream (KENALOG) 0.1 % Apply 1 application topically 2 (two) times daily. (Patient taking  differently: Apply 1 application topically 2 (two) times daily as needed (Skin condition flare up). ) 30 g 0  . naproxen (NAPROSYN) 500 MG tablet Take 1 tablet (500 mg total) by mouth 2 (two) times daily with a meal. (Patient not taking: Reported on 10/23/2014) 30 tablet 1  . ondansetron (ZOFRAN) 4 MG tablet Take 1 tablet (4 mg total) by mouth every 8 (eight) hours as needed for nausea or vomiting. (Patient not taking: Reported on 10/23/2014) 30 tablet 0    Allergies: Allergies as of 10/23/2014  . (No Known Allergies)   Past Medical History  Diagnosis Date  . Hypertension   . Hyperlipidemia   . GERD (gastroesophageal reflux disease)   . Adhesive capsulitis of shoulder     bilateral, Steroid injection Dr. Truman Hayward 1/12 bilaterally  .  Peripheral neuropathy   . Adhesive capsulitis of right shoulder     Steroid injection Dr. Truman Hayward 1/12  . Adhesive capsulitis of left shoulder     Steroid injection Dr. Truman Hayward 1/12  . Obesity   . Diabetic retinopathy   . Glaucoma   . Diabetes mellitus     Type II, insulin dependent  . CAD (coronary artery disease)     nonobstructive. Last cardiac cath (2008) showing left circumflex with mid 50% stenosis and distal luminal irregularities. Also with RCA with mid to distal 30-40% stenosis. // Previously evaluated by [facility], never followed up outpatient.  . CHF (congestive heart failure)   . CAP (community acquired pneumonia) 06/15/2012    05/2012 CXR: Mild opacification of the posterior lung base on the lateral film  as cannot exclude infection/atelectasis.     Past Surgical History  Procedure Laterality Date  . Cataract extraction    . Glaucoma surgery    . Cardiac catheterization    . Tubal ligation    . Left heart catheterization with coronary angiogram N/A 01/24/2012    Procedure: LEFT HEART CATHETERIZATION WITH CORONARY ANGIOGRAM;  Surgeon: Sherren Mocha, MD;  Location: [facility] CATH LAB;  Service: Cardiovascular;  Laterality: N/A;   Family History    Problem Relation Age of Onset  . Hypertension Sister   . Hypertension Sister   . Hypertension Sister    History   Social History  . Marital Status: Single    Spouse Name: N/A  . Number of Children: 2  . Years of Education: 12th grade   Occupational History  . 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    Social History Main Topics  . Smoking status: Former Smoker -- 0.30 packs/day for 7 years    Types: Cigarettes    Quit date: 06/27/2006  . Smokeless tobacco: Never Used  . Alcohol Use: No  . Drug Use: No  . Sexual Activity: Not Currently   Other Topics Concern  . Not on file   Social History Narrative   Lives with her kids, 7yo daughter and 57 yo adopted son.    Physical Exam: Filed Vitals:   10/24/14 0420  BP: 154/68  Pulse: 87  Temp: 98.2 F (36.8 C)  Resp: 18   Physical Exam  Constitutional: She is oriented to person, place, and time and well-developed, well-nourished, and in no distress. No distress.  HENT:  Head: Normocephalic and atraumatic.  Left Ear: External ear normal.  Nose: Nose normal.  Mouth/Throat: No oropharyngeal exudate.  Significant cerumen accumulation in right ear canal, tympanic membranes unable to be visualized. Dry mucous membranes.  Eyes: Pupils are equal, round, and reactive to light. No scleral icterus.  Status post cataract surgery, disconjugate gaze with left eye deviated leftward. Rightward beating nystagmus.  Neck: Normal range of motion. Neck supple.  Cardiovascular: Normal rate, regular rhythm and normal heart sounds.   Pulmonary/Chest: Effort normal and breath sounds normal. No respiratory distress. She exhibits tenderness (reproducible chest pain).  Abdominal: Soft. Bowel sounds are normal. She exhibits no distension. There is tenderness (mild diffuse).  Musculoskeletal: Normal range of motion. She exhibits edema (trace bilateral) and tenderness (palpation of mid  to lower back).  Lymphadenopathy:    She has no cervical adenopathy.  Neurological: She is alert and oriented to person, place, and time. No cranial nerve deficit. She exhibits normal muscle tone. Coordination normal.  5/5 strength throughout, sensation  to light touch intact, finger to nose and heel to shin normal, no pronator drift. Gait limited by dizziness, leaning to the left.  Skin: Skin is warm and dry. No rash noted. She is not diaphoretic. No erythema.   Lab results: Basic Metabolic Panel:  Recent Labs  10/23/14 1758  NA 136  K 4.0  CL 99  CO2 26  GLUCOSE 188*  BUN 17  CREATININE 0.90  CALCIUM 9.4   CBC:  Recent Labs  10/23/14 1758  WBC 13.0*  NEUTROABS 7.3  HGB 11.5*  HCT 35.6*  MCV 87.9  PLT 295   Urine Drug Screen: Drugs of Abuse     Component Value Date/Time   LABOPIA NONE DETECTED 09/06/2012 1830   COCAINSCRNUR NONE DETECTED 09/06/2012 1830   LABBENZ NONE DETECTED 09/06/2012 1830   AMPHETMU NONE DETECTED 09/06/2012 1830   THCU NONE DETECTED 09/06/2012 1830   LABBARB NONE DETECTED 09/06/2012 1830    Urinalysis:  Recent Labs  10/23/14 2220  COLORURINE YELLOW  LABSPEC 1.010  PHURINE 5.0  GLUCOSEU NEGATIVE  HGBUR NEGATIVE  BILIRUBINUR NEGATIVE  KETONESUR NEGATIVE  PROTEINUR NEGATIVE  UROBILINOGEN 0.2  NITRITE NEGATIVE  LEUKOCYTESUR TRACE*  Few squamous epithelial cells, 0-2 WBCs, rare bacteria.  Troponin [facility] Test)  Recent Labs  10/23/14 2155  TROPIPOC 0.00   BNP    Component Value Date/Time   BNP 16.0 10/23/2014 1758    ProBNP    Component Value Date/Time   PROBNP 15.3 07/22/2013 1219   Imaging results:  Dg Chest 2 View  10/23/2014   CLINICAL DATA:  Back pain and headache, dizziness 2 days.  EXAM: CHEST  2 VIEW  COMPARISON:  07/22/2013  FINDINGS: Normal mediastinum and cardiac silhouette. Normal pulmonary vasculature. No evidence of effusion, infiltrate, or pneumothorax. No acute bony abnormality.  IMPRESSION: No  acute cardiopulmonary process.   Electronically Signed   By: Suzy Bouchard M.D.   On: 10/23/2014 19:29   Ct Head Wo Contrast  10/23/2014   CLINICAL DATA:  Dizziness, nausea, upper back pain  EXAM: CT HEAD WITHOUT CONTRAST  TECHNIQUE: Contiguous axial images were obtained from the base of the skull through the vertex without intravenous contrast.  COMPARISON:  None.  FINDINGS: No acute intracranial hemorrhage. No focal mass lesion. No CT evidence of acute infarction. No midline shift or mass effect. No hydrocephalus. Basilar cisterns are patent.  Paranasal sinuses and  mastoid air cells are clear.  IMPRESSION: No acute intracranial findings.  Normal head CT for age   Electronically Signed   By: Suzy Bouchard M.D.   On: 10/23/2014 22:51   Mr Jodene Nam Head Wo Contrast  10/24/2014   CLINICAL DATA:  Initial evaluation for acute headache, dizziness. History of hypertension, hyperlipidemia, diabetes.  EXAM: MRI HEAD WITHOUT AND WITH CONTRAST  MRA HEAD WITHOUT CONTRAST  TECHNIQUE: Multiplanar, multiecho pulse sequences of the brain and surrounding structures were obtained without and with intravenous contrast. Angiographic images of the head were obtained using MRA technique without contrast.  CONTRAST:  58m MULTIHANCE GADOBENATE DIMEGLUMINE 529 MG/ML IV SOLN  COMPARISON:  Prior head CT from earlier the same day.  FINDINGS: MRI HEAD FINDINGS  The CSF containing spaces are within normal limits for patient age. Mild patchy T2/FLAIR hyperintensity present within the periventricular white matter both cerebral hemispheres, likely related this chronic small vessel ischemic disease. Similar changes present within the pons. No mass lesion, midline shift, or extra-axial fluid collection. Ventricles are normal in size without evidence of  hydrocephalus.  No diffusion-weighted signal abnormality is identified to suggest acute intracranial infarct. Gray-white matter differentiation is maintained. Normal flow voids are seen  within the intracranial vasculature. No intracranial hemorrhage identified.  No abnormal enhancement.  The cervicomedullary junction is normal. Pituitary gland is within normal limits. Pituitary stalk is midline. The globes and optic nerves demonstrate a normal appearance with normal signal intensity.  The bone marrow signal intensity is normal. Calvarium is intact. Visualized upper cervical spine is within normal limits.  Scalp soft tissues are unremarkable.  Paranasal sinuses are clear.  No mastoid effusion.  MRA HEAD FINDINGS  ANTERIOR CIRCULATION:  Visualized portions of the distal cervical segments of the internal carotid arteries are widely patent with antegrade flow. The petrous, cavernous, and supra clinoid segments are widely patent. Left A1 segment slightly hypoplastic. A1 segments well opacified. Anterior communicating artery and anterior cerebral arteries well opacified.  M1 branches widely patent without stenosis or occlusion. MCA bifurcations normal. Distal MCA branches well opacified bilaterally.  POSTERIOR CIRCULATION:  Right vertebral artery is not visualize, which may be occluded or hypoplastic. The left vertebral artery is widely patent to the vertebrobasilar junction. Basilar artery is somewhat diminutive but patent. Anterior inferior cerebellar arteries patent proximally. The right superior cerebellar artery appears to arise from the basilar artery and is patent. There is fetal origin of the posterior cerebral arteries bilaterally with widely patent posterior communicating arteries. The left superior cerebellar artery appears to arise from the left posterior cerebral artery.  No aneurysm.  IMPRESSION: MRI HEAD IMPRESSION:  1. No acute intracranial process identified. 2. Mild chronic small vessel ischemic disease involving the periventricular white matter and pons.  MRA HEAD IMPRESSION:  1. No hemodynamically significant stenosis or arterial occlusion identified within the intracranial  circulation. 2. Nonvisualization of the right vertebral artery, which may be hypoplastic or occluded in the neck. 3. Fetal origin of the posterior cerebral arteries bilaterally with markedly diminutive vertebrobasilar system. Bilateral posterior communicating arteries widely patent.   Electronically Signed   By: Jeannine Boga M.D.   On: 10/24/2014 04:00   Mr Jeri Cos PP Contrast  10/24/2014   CLINICAL DATA:  Initial evaluation for acute headache, dizziness. History of hypertension, hyperlipidemia, diabetes.  EXAM: MRI HEAD WITHOUT AND WITH CONTRAST  MRA HEAD WITHOUT CONTRAST  TECHNIQUE: Multiplanar, multiecho pulse sequences of the brain and surrounding structures were obtained without and with intravenous contrast. Angiographic images of the head were obtained using MRA technique without contrast.  CONTRAST:  66m MULTIHANCE GADOBENATE DIMEGLUMINE 529 MG/ML IV SOLN  COMPARISON:  Prior head CT from earlier the same day.  FINDINGS: MRI HEAD FINDINGS  The CSF containing spaces are within normal limits for patient age. Mild patchy T2/FLAIR hyperintensity present within the periventricular white matter both cerebral hemispheres, likely related this chronic small vessel ischemic disease. Similar changes present within the pons. No mass lesion, midline shift, or extra-axial fluid collection. Ventricles are normal in size without evidence of hydrocephalus.  No diffusion-weighted signal abnormality is identified to suggest acute intracranial infarct. Gray-white matter differentiation is maintained. Normal flow voids are seen within the intracranial vasculature. No intracranial hemorrhage identified.  No abnormal enhancement.  The cervicomedullary junction is normal. Pituitary gland is within normal limits. Pituitary stalk is midline. The globes and optic nerves demonstrate a normal appearance with normal signal intensity.  The bone marrow signal intensity is normal. Calvarium is intact. Visualized upper cervical  spine is within normal limits.  Scalp soft tissues are unremarkable.  Paranasal  sinuses are clear.  No mastoid effusion.  MRA HEAD FINDINGS  ANTERIOR CIRCULATION:  Visualized portions of the distal cervical segments of the internal carotid arteries are widely patent with antegrade flow. The petrous, cavernous, and supra clinoid segments are widely patent. Left A1 segment slightly hypoplastic. A1 segments well opacified. Anterior communicating artery and anterior cerebral arteries well opacified.  M1 branches widely patent without stenosis or occlusion. MCA bifurcations normal. Distal MCA branches well opacified bilaterally.  POSTERIOR CIRCULATION:  Right vertebral artery is not visualize, which may be occluded or hypoplastic. The left vertebral artery is widely patent to the vertebrobasilar junction. Basilar artery is somewhat diminutive but patent. Anterior inferior cerebellar arteries patent proximally. The right superior cerebellar artery appears to arise from the basilar artery and is patent. There is fetal origin of the posterior cerebral arteries bilaterally with widely patent posterior communicating arteries. The left superior cerebellar artery appears to arise from the left posterior cerebral artery.  No aneurysm.  IMPRESSION: MRI HEAD IMPRESSION:  1. No acute intracranial process identified. 2. Mild chronic small vessel ischemic disease involving the periventricular white matter and pons.  MRA HEAD IMPRESSION:  1. No hemodynamically significant stenosis or arterial occlusion identified within the intracranial circulation. 2. Nonvisualization of the right vertebral artery, which may be hypoplastic or occluded in the neck. 3. Fetal origin of the posterior cerebral arteries bilaterally with markedly diminutive vertebrobasilar system. Bilateral posterior communicating arteries widely patent.   Electronically Signed   By: Jeannine Boga M.D.   On: 10/24/2014 04:00   Other results: EKG: normal EKG,  normal sinus rhythm, unchanged from previous tracings.  Assessment & Plan by Problem: Principal Problem:   Vertigo Active Problems:   Uncontrolled type 2 diabetes with neuropathy   Anemia   Essential hypertension   Chest pain   Systolic CHF   Orthostatic hypotension  #Vertigo Similar symptoms in March 2014 with orthostatic hypotension and dizziness. 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 She improved at that time with rehydration. Question whether her vascular anatomy could predispose her to be more symptomatic when dehydrated or possible increase her probability of stroke. No evidence of ischemia on MRI with MRA findings similar to prior. Hearing loss and tinnitus in right ear, but this may be secondary to significant accumulation of cerumen. However, Mnire's disease is certainly possible given her recurrent dizziness. No recent viral illness to suggest labyrinthitis and incidents appear too persistent to be BPPV. Question whether this may all be due to orthostatic hypotension secondary to poor by mouth intake along with diarrhea previously. She is on several medications that he can contribute to dizziness. Poor response to meclizine. -Admit to telemetry. -Neurology has been consulted. Follow-up recommendations. -Clean right ear and reexamine in morning. -Stop amitriptyline. -Stop Flexeril. -Consider reducing gabapentin further. -Normal saline at 50 mL per hour. -Repeat orthostatic vital signs in the morning. -Heart healthy/carb modified diet.  #Musculoskeletal chest pain History of nonobstructive coronary disease. Chest pain is reproducible on exam, EKG and troponin negative. -No further workup.  #Chronic back pain She is prescribed Vicodin by her PCP Dr. Raelene Bott  for her back pain. This likely represents an exacerbation of her symptoms. -Continue home Vicodin 5-325 milligrams every 6 hours when necessary.  #Type 2 diabetes with retinopathy Last hemoglobin A1c 8.5 in February  2016. On metformin and Lantus 44 units daily at bedtime along with NovoLog 5-6 units sliding scale. -Hold home metformin 1000 mg twice a day. -CBGs before meals and at bedtime. -Sliding scale insulin. -Lantus 30 units daily at bedtime. -Continue gabapentin 300 mg twice a day.  #Nonobstructive coronary artery disease Cardiac catheterization in July 2013 showed nonobstructive disease with maximal stenosis of 50% of the LAD. -Continue home aspirin 325 mg daily.  #Congestive heart failure Last echocardiogram in March 2014 was extremely limited, previous in July 2013 showed ejection fraction of 35-40% with diffuse hypokinesis. Only trace edema on exam. -Hold home Coreg 6.25 mg twice a day in the setting of orthostasis. -Hold home Lasix 20 mg daily.  #Iron deficiency anemia Hemoglobin at baseline. No anemia panel since 2013, normal at that time. On iron supplementation at home. -Check anemia panel. -Continue ferrous sulfate 325 mg daily.  #Glaucoma -Continue home Cosopt.  #GERD -Continue home Protonix 20 mg daily.  #DVT prophylaxis -Heparin.  Dispo: Disposition is deferred at this time, awaiting improvement of current medical problems. Anticipated discharge in approximately 1-3 day(s).   The patient does have a current PCP Luan Moore, MD), therefore will be require OPC follow-up after discharge.   The patient does not have transportation limitations that hinder transportation to clinic appointments.   Signed:  Arman Filter, MD, PhD PGY-1 Internal Medicine Teaching Service Pager: [phone] 10/24/2014, 4:22 AM

## 2014-10-24 NOTE — Progress Notes (Signed)
Subjective: Patient states that her vertigo is persistent although somewhat improved compared to yesterday. Patient is otherwise not reporting any complaints this morning. Objective: Vital signs in last 24 hours: Filed Vitals:   10/24/14 0330 10/24/14 0420 10/24/14 0434 10/24/14 1020  BP: 160/82 154/68 166/65 127/75  Pulse: 98 87 77 81  Temp:  98.2 F (36.8 C) 97.9 F (36.6 C) 97.1 F (36.2 C)  TempSrc:  Oral Oral Oral  Resp: 15 18 18 18   Height:   5\' 6"  (1.676 m)   Weight:   251 lb 3.2 oz (113.944 kg)   SpO2: 96% 100% 98% 100%   Weight change:   Intake/Output Summary (Last 24 hours) at 10/24/14 1043 Last data filed at 10/24/14 0837  Gross per 24 hour  Intake 1526.33 ml  Output      0 ml  Net 1526.33 ml    General: resting in bed, in no acute distress HEENT: PERRL, EOMI, no scleral icterus, left auditory canal clear with visualized tympanic membrane, right auditory canal with cerumen occluding view of tympanic membrane Cardiac: RRR, no rubs, murmurs or gallops Pulm: clear to auscultation bilaterally, moving normal volumes of air Abd: soft, nontender, nondistended, BS present Ext: warm and well perfused, no pedal edema Neuro: alert and oriented X3, cranial nerves II-XII grossly intact, hearing loss on the right Skin: no rashes or lesions noted Psych: appropriate affect  Lab Results: Basic Metabolic Panel:  Recent Labs Lab 10/23/14 1758 10/24/14 0552  NA 136 137  K 4.0 3.7  CL 99 100  CO2 26 28  GLUCOSE 188* 279*  BUN 17 13  CREATININE 0.90 1.02  CALCIUM 9.4 9.1   Liver Function Tests:  Recent Labs Lab 10/24/14 0552  AST 17  ALT 17  ALKPHOS 71  BILITOT 0.5  PROT 6.9  ALBUMIN 3.5   No results for input(s): LIPASE, AMYLASE in the last 168 hours. No results for input(s): AMMONIA in the last 168 hours. CBC:  Recent Labs Lab 10/23/14 1758 10/24/14 0552  WBC 13.0* 10.1  NEUTROABS 7.3 6.4  HGB 11.5* 10.7*  HCT 35.6* 33.5*  MCV 87.9 86.8  PLT  295 266   Cardiac Enzymes:  Recent Labs Lab 10/24/14 0552  TROPONINI <0.03   BNP: No results for input(s): PROBNP in the last 168 hours. D-Dimer: No results for input(s): DDIMER in the last 168 hours. CBG:  Recent Labs Lab 10/24/14 0553 10/24/14 0722  GLUCAP 253* 274*   Hemoglobin A1C: No results for input(s): HGBA1C in the last 168 hours. Fasting Lipid Panel: No results for input(s): CHOL, HDL, LDLCALC, TRIG, CHOLHDL, LDLDIRECT in the last 168 hours. Thyroid Function Tests: No results for input(s): TSH, T4TOTAL, FREET4, T3FREE, THYROIDAB in the last 168 hours. Coagulation: No results for input(s): LABPROT, INR in the last 168 hours. Anemia Panel:  Recent Labs Lab 10/24/14 0552  RETICCTPCT 1.4   Urine Drug Screen: Drugs of Abuse     Component Value Date/Time   LABOPIA NONE DETECTED 09/06/2012 1830   COCAINSCRNUR NONE DETECTED 09/06/2012 1830   LABBENZ NONE DETECTED 09/06/2012 1830   AMPHETMU NONE DETECTED 09/06/2012 1830   THCU NONE DETECTED 09/06/2012 1830   LABBARB NONE DETECTED 09/06/2012 1830    Alcohol Level: No results for input(s): ETH in the last 168 hours. Urinalysis:  Recent Labs Lab 10/23/14 2220  COLORURINE YELLOW  LABSPEC 1.010  PHURINE 5.0  GLUCOSEU NEGATIVE  HGBUR NEGATIVE  BILIRUBINUR NEGATIVE  KETONESUR NEGATIVE  PROTEINUR NEGATIVE  UROBILINOGEN 0.2  NITRITE NEGATIVE  LEUKOCYTESUR TRACE*   Micro Results: No results found for this or any previous visit (from the past 240 hour(s)). Studies/Results: Dg Chest 2 View  10/23/2014   CLINICAL DATA:  Back pain and headache, dizziness 2 days.  EXAM: CHEST  2 VIEW  COMPARISON:  07/22/2013  FINDINGS: Normal mediastinum and cardiac silhouette. Normal pulmonary vasculature. No evidence of effusion, infiltrate, or pneumothorax. No acute bony abnormality.  IMPRESSION: No acute cardiopulmonary process.   Electronically Signed   By: Suzy Bouchard M.D.   On: 10/23/2014 19:29   Ct Head Wo  Contrast  10/23/2014   CLINICAL DATA:  Dizziness, nausea, upper back pain  EXAM: CT HEAD WITHOUT CONTRAST  TECHNIQUE: Contiguous axial images were obtained from the base of the skull through the vertex without intravenous contrast.  COMPARISON:  None.  FINDINGS: No acute intracranial hemorrhage. No focal mass lesion. No CT evidence of acute infarction. No midline shift or mass effect. No hydrocephalus. Basilar cisterns are patent.  Paranasal sinuses and  mastoid air cells are clear.  IMPRESSION: No acute intracranial findings.  Normal head CT for age   Electronically Signed   By: Suzy Bouchard M.D.   On: 10/23/2014 22:51   Mr Jodene Nam Head Wo Contrast  10/24/2014   CLINICAL DATA:  Initial evaluation for acute headache, dizziness. History of hypertension, hyperlipidemia, diabetes.  EXAM: MRI HEAD WITHOUT AND WITH CONTRAST  MRA HEAD WITHOUT CONTRAST  TECHNIQUE: Multiplanar, multiecho pulse sequences of the brain and surrounding structures were obtained without and with intravenous contrast. Angiographic images of the head were obtained using MRA technique without contrast.  CONTRAST:  71mL MULTIHANCE GADOBENATE DIMEGLUMINE 529 MG/ML IV SOLN  COMPARISON:  Prior head CT from earlier the same day.  FINDINGS: MRI HEAD FINDINGS  The CSF containing spaces are within normal limits for patient age. Mild patchy T2/FLAIR hyperintensity present within the periventricular white matter both cerebral hemispheres, likely related this chronic small vessel ischemic disease. Similar changes present within the pons. No mass lesion, midline shift, or extra-axial fluid collection. Ventricles are normal in size without evidence of hydrocephalus.  No diffusion-weighted signal abnormality is identified to suggest acute intracranial infarct. Gray-white matter differentiation is maintained. Normal flow voids are seen within the intracranial vasculature. No intracranial hemorrhage identified.  No abnormal enhancement.  The cervicomedullary  junction is normal. Pituitary gland is within normal limits. Pituitary stalk is midline. The globes and optic nerves demonstrate a normal appearance with normal signal intensity.  The bone marrow signal intensity is normal. Calvarium is intact. Visualized upper cervical spine is within normal limits.  Scalp soft tissues are unremarkable.  Paranasal sinuses are clear.  No mastoid effusion.  MRA HEAD FINDINGS  ANTERIOR CIRCULATION:  Visualized portions of the distal cervical segments of the internal carotid arteries are widely patent with antegrade flow. The petrous, cavernous, and supra clinoid segments are widely patent. Left A1 segment slightly hypoplastic. A1 segments well opacified. Anterior communicating artery and anterior cerebral arteries well opacified.  M1 branches widely patent without stenosis or occlusion. MCA bifurcations normal. Distal MCA branches well opacified bilaterally.  POSTERIOR CIRCULATION:  Right vertebral artery is not visualize, which may be occluded or hypoplastic. The left vertebral artery is widely patent to the vertebrobasilar junction. Basilar artery is somewhat diminutive but patent. Anterior inferior cerebellar arteries patent proximally. The right superior cerebellar artery appears to arise from the basilar artery and is patent. There is fetal origin of the posterior cerebral arteries bilaterally with widely patent  posterior communicating arteries. The left superior cerebellar artery appears to arise from the left posterior cerebral artery.  No aneurysm.  IMPRESSION: MRI HEAD IMPRESSION:  1. No acute intracranial process identified. 2. Mild chronic small vessel ischemic disease involving the periventricular white matter and pons.  MRA HEAD IMPRESSION:  1. No hemodynamically significant stenosis or arterial occlusion identified within the intracranial circulation. 2. Nonvisualization of the right vertebral artery, which may be hypoplastic or occluded in the neck. 3. Fetal origin of  the posterior cerebral arteries bilaterally with markedly diminutive vertebrobasilar system. Bilateral posterior communicating arteries widely patent.   Electronically Signed   By: Jeannine Boga M.D.   On: 10/24/2014 04:00   Mr Jeri Cos [facility] Contrast  10/24/2014   CLINICAL DATA:  Initial evaluation for acute headache, dizziness. History of hypertension, hyperlipidemia, diabetes.  EXAM: MRI HEAD WITHOUT AND WITH CONTRAST  MRA HEAD WITHOUT CONTRAST  TECHNIQUE: Multiplanar, multiecho pulse sequences of the brain and surrounding structures were obtained without and with intravenous contrast. Angiographic images of the head were obtained using MRA technique without contrast.  CONTRAST:  30mL MULTIHANCE GADOBENATE DIMEGLUMINE 529 MG/ML IV SOLN  COMPARISON:  Prior head CT from earlier the same day.  FINDINGS: MRI HEAD FINDINGS  The CSF containing spaces are within normal limits for patient age. Mild patchy T2/FLAIR hyperintensity present within the periventricular white matter both cerebral hemispheres, likely related this chronic small vessel ischemic disease. Similar changes present within the pons. No mass lesion, midline shift, or extra-axial fluid collection. Ventricles are normal in size without evidence of hydrocephalus.  No diffusion-weighted signal abnormality is identified to suggest acute intracranial infarct. Gray-white matter differentiation is maintained. Normal flow voids are seen within the intracranial vasculature. No intracranial hemorrhage identified.  No abnormal enhancement.  The cervicomedullary junction is normal. Pituitary gland is within normal limits. Pituitary stalk is midline. The globes and optic nerves demonstrate a normal appearance with normal signal intensity.  The bone marrow signal intensity is normal. Calvarium is intact. Visualized upper cervical spine is within normal limits.  Scalp soft tissues are unremarkable.  Paranasal sinuses are clear.  No mastoid effusion.  MRA HEAD  FINDINGS  ANTERIOR CIRCULATION:  Visualized portions of the distal cervical segments of the internal carotid arteries are widely patent with antegrade flow. The petrous, cavernous, and supra clinoid segments are widely patent. Left A1 segment slightly hypoplastic. A1 segments well opacified. Anterior communicating artery and anterior cerebral arteries well opacified.  M1 branches widely patent without stenosis or occlusion. MCA bifurcations normal. Distal MCA branches well opacified bilaterally.  POSTERIOR CIRCULATION:  Right vertebral artery is not visualize, which may be occluded or hypoplastic. The left vertebral artery is widely patent to the vertebrobasilar junction. Basilar artery is somewhat diminutive but patent. Anterior inferior cerebellar arteries patent proximally. The right superior cerebellar artery appears to arise from the basilar artery and is patent. There is fetal origin of the posterior cerebral arteries bilaterally with widely patent posterior communicating arteries. The left superior cerebellar artery appears to arise from the left posterior cerebral artery.  No aneurysm.  IMPRESSION: MRI HEAD IMPRESSION:  1. No acute intracranial process identified. 2. Mild chronic small vessel ischemic disease involving the periventricular white matter and pons.  MRA HEAD IMPRESSION:  1. No hemodynamically significant stenosis or arterial occlusion identified within the intracranial circulation. 2. Nonvisualization of the right vertebral artery, which may be hypoplastic or occluded in the neck. 3. Fetal origin of the posterior cerebral arteries bilaterally with markedly diminutive  vertebrobasilar system. Bilateral posterior communicating arteries widely patent.   Electronically Signed   By: Jeannine Boga M.D.   On: 10/24/2014 04:00   Medications: I have reviewed the patient's current medications. Scheduled Meds: . aspirin EC  325 mg Oral Daily  . dorzolamide-timolol  1 drop Right Eye BID  .  ferrous sulfate  325 mg Oral Q breakfast  . gabapentin  300 mg Oral BID  . heparin  5,000 Units Subcutaneous 3 times per day  . insulin aspart  0-15 Units Subcutaneous TID WC  . insulin glargine  30 Units Subcutaneous QHS  . pantoprazole  20 mg Oral Daily  . [START ON 10/25/2014] pneumococcal 23 valent vaccine  0.5 mL Intramuscular Tomorrow-1000  . pravastatin  20 mg Oral q1800  . sodium chloride  3 mL Intravenous Q12H  . sodium chloride  3 mL Intravenous Q12H   Continuous Infusions: . sodium chloride 50 mL/hr at 10/24/14 0456   PRN Meds:.HYDROcodone-acetaminophen Assessment/Plan: Principal Problem:   Vertigo Active Problems:   Uncontrolled type 2 diabetes with neuropathy   Essential hypertension   GERD   Systolic CHF   Anemia   Chest pain   Excessive cerumen in right ear canal   Orthostatic hypotension   Patient is a 54 year old woman with a history of hypertension, hyperlipidemia, type 2 diabetes with retinopathy, nonobstructive coronary artery disease, congestive heart failure, and GERD who was admitted for vertigo.  Vertigo: Patient remains symptomatic this morning with sensation of room spinning although she states that it is slightly improved since yesterday. Patient has not received any meclizine or other related medications yet this morning. Physical exam remarkable for significant cerumen impaction in the right auditory canal. Patient is now status post irrigation of the ear with moderate cerumen removal although with some residual cerumen. There is a significant likelihood that patient's presentation could be secondary to cerumen impaction. An additional consideration could be Mnire's disease given patient's previous and ongoing symptoms of right-sided hearing loss, vertigo, and tinnitus. Labyrinthitis is less of a consideration given no recent history of upper respiratory tract infection. BPPV is also a consideration although there is concurrent hearing loss. Vestibular  migraines is another consideration although patient does not report any related symptoms of photophobia, aura. -Continue with Debrox eardrops.  -Patient's home amitriptyline and Flexeril discontinued. -MRI unremarkable for any evidence of ischemia. -Vestibular PT -Remeasure orthostatic vitals -Consider reduction of gabapentin should patient have residual symptoms.  Chronic back pain: At home, patient takes Vicodin 5-325 mg every 6 hours when necessary. No acute issues. -Continue home Vicodin.  Type 2 diabetes with retinopathy: Patient with elevated glucose of 274 this morning. Patient did not receive her Lantus last night. Last hemoglobin A1c 8.5 in February 2016. On metformin and Lantus 44 units daily at bedtime along with NovoLog 5-6 units sliding scale. -Hold home metformin 1000 mg twice a day. -CBGs before meals and at bedtime. -Sliding scale insulin. -Lantus 30 units daily at bedtime. -Continue gabapentin 300 mg twice a day.  Nonobstructive coronary artery disease: Cardiac catheterization in July 2013 showed nonobstructive disease with maximal stenosis of 50% of the LAD. -Continue home aspirin 325 mg daily.  Congestive heart failure: Last echocardiogram in March 2014 was extremely limited, previous in July 2013 showed ejection fraction of 35-40% with diffuse hypokinesis. Only trace edema on exam. -Hold home Coreg 6.25 mg twice a day in the setting of orthostasis. -Hold home Lasix 20 mg daily.  Iron deficiency anemia: Slight down trend in hemoglobin to  10.7 from 11.5. No anemia panel since 2013, normal at that time. On iron supplementation at home. -Anemia panel pending. -Continue ferrous sulfate 325 mg daily.  Glaucoma -Continue home Cosopt.  GERD -Continue home Protonix 20 mg daily.  DVT prophylaxis -Heparin.  Dispo: Disposition is deferred at this time, awaiting improvement of current medical problems. Anticipated discharge in approximately 0-1 day(s).   The patient  does have a current PCP Luan Moore, MD), therefore will be require [facility]PC follow-up after discharge.   The patient does not have transportation limitations that hinder transportation to clinic appointments.     Services Needed at time of discharge: Y = Yes, Blank = No PT:   OT:   RN:   Equipment:   Other:    Luan Moore, MD 10/24/2014, 10:43 AM

## 2014-10-24 NOTE — Discharge Summary (Signed)
Name: [name] MRN: [id number] DOB: August 14, 1960 54 y.o. PCP: Luan Moore, MD  Date of Admission: 10/23/2014  9:06 PM Date of Discharge: 10/24/2014 Attending Physician: Sid Falcon, MD  Discharge Diagnosis: Principal Problem:   Vertigo Active Problems:   Uncontrolled type 2 diabetes with neuropathy   Essential hypertension   GERD   Systolic CHF   Anemia   Chest pain   Excessive cerumen in right ear canal   Orthostatic hypotension   Bilateral low back pain without sciatica   Pain in the chest  Discharge Medications:   Medication List    STOP taking these medications        amitriptyline 25 MG tablet  Commonly known as:  ELAVIL      TAKE these medications        aspirin 325 MG EC tablet  Take 1 tablet (325 mg total) by mouth daily.     carbamide peroxide 6.5 % otic solution  Commonly known as:  DEBROX  Place 5 drops into the right ear 2 (two) times daily.     carvedilol 6.25 MG tablet  Commonly known as:  COREG  Take 1 tablet (6.25 mg total) by mouth 2 (two) times daily.     cyclobenzaprine 5 MG tablet  Commonly known as:  FLEXERIL  Take 1 tablet (5 mg total) by mouth at bedtime.     dorzolamide-timolol 22.3-6.8 MG/ML ophthalmic solution  Commonly known as:  COSOPT  Place 1 drop into the right eye 2 (two) times daily.     ferrous sulfate 325 (65 FE) MG tablet  Take 1 tablet (325 mg total) by mouth daily with breakfast.     furosemide 40 MG tablet  Commonly known as:  LASIX  Take 0.5 tablets (20 mg total) by mouth daily.     gabapentin 300 MG capsule  Commonly known as:  NEURONTIN  TAKE 1 CAPSULE BY MOUTH TWICE DAILY     glucose blood test strip  Commonly known as:  ONETOUCH VERIO  Check blood sugar as instructed up to 3 times a day     HYDROcodone-acetaminophen 5-325 MG per tablet  Commonly known as:  NORCO/VICODIN  Take 2 tablets by mouth every 6 (six) hours as needed for moderate pain (pain).     Insulin Pen Needle 31G X 5 MM Misc    Commonly known as:  B-D UF III MINI PEN NEEDLES  Use to inject insulin 4-5 times daily     LANTUS SOLOSTAR 100 UNIT/ML Solostar Pen  Generic drug:  Insulin Glargine  Inject 44 Units into the skin at bedtime.     lisinopril 20 MG tablet  Commonly known as:  PRINIVIL,ZESTRIL  Take 0.5 tablets (10 mg total) by mouth daily.     meclizine 50 MG tablet  Commonly known as:  ANTIVERT  Take 1 tablet (50 mg total) by mouth 2 (two) times daily.     metFORMIN 1000 MG tablet  Commonly known as:  GLUCOPHAGE  TAKE 1 TABLET BY MOUTH TWICE DAILY WITH MEALS     naproxen 500 MG tablet  Commonly known as:  NAPROSYN  Take 1 tablet (500 mg total) by mouth 2 (two) times daily with a meal.     NOVOLOG FLEXPEN 100 UNIT/ML FlexPen  Generic drug:  insulin aspart  INJECT 4-6 UNITS INTO THE SKIN THREE TIMES DAILY BEFORE MEALS. 4 UNITS IN THE MORNING AND 6 UNITS IN THE EVENING     ondansetron 4 MG tablet  Commonly known as:  ZOFRAN  Take 1 tablet (4 mg total) by mouth every 8 (eight) hours as needed for nausea or vomiting.     ONETOUCH DELICA LANCETS FINE Misc  Check blood sugar as instructed up to 3 times a day     ONETOUCH VERIO IQ SYSTEM W/DEVICE Kit  Check blood sugar as instructed up to 3 times a day     pantoprazole 20 MG tablet  Commonly known as:  PROTONIX  Take 1 tablet (20 mg total) by mouth daily.     pravastatin 20 MG tablet  Commonly known as:  PRAVACHOL  TAKE 1 TABLET BY MOUTH EVERY DAY     triamcinolone cream 0.1 %  Commonly known as:  KENALOG  Apply 1 application topically 2 (two) times daily.        Disposition and follow-up:   [name] was discharged from [facility] in Stable condition.  At the hospital follow up visit please address:  1.    Vertigo: Would consider reassessment of cerumen impaction and/or referral to ENT should symptoms persist as an outpatient.  Iron deficiency anemia:  At the time of discharge, a repeat anemia panel is  pending.  2.  Labs / imaging needed at time of follow-up: none  3.  Pending labs/ test needing follow-up: anemia panel  Follow-up Appointments:     Follow-up Information    Follow up with Dellia Nims, MD.   Specialty:  Internal Medicine   Why:  May 6, 2:15 pm   Contact information:   [place] [address] [phone]       Discharge Instructions: Discharge Instructions    Call MD for:  difficulty breathing, headache or visual disturbances    Complete by:  As directed      Call MD for:  extreme fatigue    Complete by:  As directed      Call MD for:  hives    Complete by:  As directed      Call MD for:  persistant dizziness or light-headedness    Complete by:  As directed      Call MD for:  persistant nausea and vomiting    Complete by:  As directed      Call MD for:  redness, tenderness, or signs of infection (pain, swelling, redness, odor or green/yellow discharge around incision site)    Complete by:  As directed      Call MD for:  severe uncontrolled pain    Complete by:  As directed      Call MD for:  temperature >100.4    Complete by:  As directed      Diet - low sodium heart healthy    Complete by:  As directed      Increase activity slowly    Complete by:  As directed            Consultations:    Procedures Performed:  Dg Chest 2 View  10/23/2014   CLINICAL DATA:  Back pain and headache, dizziness 2 days.  EXAM: CHEST  2 VIEW  COMPARISON:  07/22/2013  FINDINGS: Normal mediastinum and cardiac silhouette. Normal pulmonary vasculature. No evidence of effusion, infiltrate, or pneumothorax. No acute bony abnormality.  IMPRESSION: No acute cardiopulmonary process.   Electronically Signed   By: Suzy Bouchard M.D.   On: 10/23/2014 19:29   Ct Head Wo Contrast  10/23/2014   CLINICAL DATA:  Dizziness, nausea, upper back  pain  EXAM: CT HEAD WITHOUT CONTRAST  TECHNIQUE: Contiguous axial images were obtained from the base of the skull through the vertex  without intravenous contrast.  COMPARISON:  None.  FINDINGS: No acute intracranial hemorrhage. No focal mass lesion. No CT evidence of acute infarction. No midline shift or mass effect. No hydrocephalus. Basilar cisterns are patent.  Paranasal sinuses and  mastoid air cells are clear.  IMPRESSION: No acute intracranial findings.  Normal head CT for age   Electronically Signed   By: Suzy Bouchard M.D.   On: 10/23/2014 22:51   Mr Jodene Nam Head Wo Contrast  10/24/2014   CLINICAL DATA:  Initial evaluation for acute headache, dizziness. History of hypertension, hyperlipidemia, diabetes.  EXAM: MRI HEAD WITHOUT AND WITH CONTRAST  MRA HEAD WITHOUT CONTRAST  TECHNIQUE: Multiplanar, multiecho pulse sequences of the brain and surrounding structures were obtained without and with intravenous contrast. Angiographic images of the head were obtained using MRA technique without contrast.  CONTRAST:  33m MULTIHANCE GADOBENATE DIMEGLUMINE 529 MG/ML IV SOLN  COMPARISON:  Prior head CT from earlier the same day.  FINDINGS: MRI HEAD FINDINGS  The CSF containing spaces are within normal limits for patient age. Mild patchy T2/FLAIR hyperintensity present within the periventricular white matter both cerebral hemispheres, likely related this chronic small vessel ischemic disease. Similar changes present within the pons. No mass lesion, midline shift, or extra-axial fluid collection. Ventricles are normal in size without evidence of hydrocephalus.  No diffusion-weighted signal abnormality is identified to suggest acute intracranial infarct. Gray-white matter differentiation is maintained. Normal flow voids are seen within the intracranial vasculature. No intracranial hemorrhage identified.  No abnormal enhancement.  The cervicomedullary junction is normal. Pituitary gland is within normal limits. Pituitary stalk is midline. The globes and optic nerves demonstrate a normal appearance with normal signal intensity.  The bone marrow signal  intensity is normal. Calvarium is intact. Visualized upper cervical spine is within normal limits.  Scalp soft tissues are unremarkable.  Paranasal sinuses are clear.  No mastoid effusion.  MRA HEAD FINDINGS  ANTERIOR CIRCULATION:  Visualized portions of the distal cervical segments of the internal carotid arteries are widely patent with antegrade flow. The petrous, cavernous, and supra clinoid segments are widely patent. Left A1 segment slightly hypoplastic. A1 segments well opacified. Anterior communicating artery and anterior cerebral arteries well opacified.  M1 branches widely patent without stenosis or occlusion. MCA bifurcations normal. Distal MCA branches well opacified bilaterally.  POSTERIOR CIRCULATION:  Right vertebral artery is not visualize, which may be occluded or hypoplastic. The left vertebral artery is widely patent to the vertebrobasilar junction. Basilar artery is somewhat diminutive but patent. Anterior inferior cerebellar arteries patent proximally. The right superior cerebellar artery appears to arise from the basilar artery and is patent. There is fetal origin of the posterior cerebral arteries bilaterally with widely patent posterior communicating arteries. The left superior cerebellar artery appears to arise from the left posterior cerebral artery.  No aneurysm.  IMPRESSION: MRI HEAD IMPRESSION:  1. No acute intracranial process identified. 2. Mild chronic small vessel ischemic disease involving the periventricular white matter and pons.  MRA HEAD IMPRESSION:  1. No hemodynamically significant stenosis or arterial occlusion identified within the intracranial circulation. 2. Nonvisualization of the right vertebral artery, which may be hypoplastic or occluded in the neck. 3. Fetal origin of the posterior cerebral arteries bilaterally with markedly diminutive vertebrobasilar system. Bilateral posterior communicating arteries widely patent.   Electronically Signed   By: BPincus BadderD.  On: 10/24/2014 04:00   Mr Jeri Cos QQ Contrast  10/24/2014   CLINICAL DATA:  Initial evaluation for acute headache, dizziness. History of hypertension, hyperlipidemia, diabetes.  EXAM: MRI HEAD WITHOUT AND WITH CONTRAST  MRA HEAD WITHOUT CONTRAST  TECHNIQUE: Multiplanar, multiecho pulse sequences of the brain and surrounding structures were obtained without and with intravenous contrast. Angiographic images of the head were obtained using MRA technique without contrast.  CONTRAST:  22m MULTIHANCE GADOBENATE DIMEGLUMINE 529 MG/ML IV SOLN  COMPARISON:  Prior head CT from earlier the same day.  FINDINGS: MRI HEAD FINDINGS  The CSF containing spaces are within normal limits for patient age. Mild patchy T2/FLAIR hyperintensity present within the periventricular white matter both cerebral hemispheres, likely related this chronic small vessel ischemic disease. Similar changes present within the pons. No mass lesion, midline shift, or extra-axial fluid collection. Ventricles are normal in size without evidence of hydrocephalus.  No diffusion-weighted signal abnormality is identified to suggest acute intracranial infarct. Gray-white matter differentiation is maintained. Normal flow voids are seen within the intracranial vasculature. No intracranial hemorrhage identified.  No abnormal enhancement.  The cervicomedullary junction is normal. Pituitary gland is within normal limits. Pituitary stalk is midline. The globes and optic nerves demonstrate a normal appearance with normal signal intensity.  The bone marrow signal intensity is normal. Calvarium is intact. Visualized upper cervical spine is within normal limits.  Scalp soft tissues are unremarkable.  Paranasal sinuses are clear.  No mastoid effusion.  MRA HEAD FINDINGS  ANTERIOR CIRCULATION:  Visualized portions of the distal cervical segments of the internal carotid arteries are widely patent with antegrade flow. The petrous, cavernous, and supra clinoid  segments are widely patent. Left A1 segment slightly hypoplastic. A1 segments well opacified. Anterior communicating artery and anterior cerebral arteries well opacified.  M1 branches widely patent without stenosis or occlusion. MCA bifurcations normal. Distal MCA branches well opacified bilaterally.  POSTERIOR CIRCULATION:  Right vertebral artery is not visualize, which may be occluded or hypoplastic. The left vertebral artery is widely patent to the vertebrobasilar junction. Basilar artery is somewhat diminutive but patent. Anterior inferior cerebellar arteries patent proximally. The right superior cerebellar artery appears to arise from the basilar artery and is patent. There is fetal origin of the posterior cerebral arteries bilaterally with widely patent posterior communicating arteries. The left superior cerebellar artery appears to arise from the left posterior cerebral artery.  No aneurysm.  IMPRESSION: MRI HEAD IMPRESSION:  1. No acute intracranial process identified. 2. Mild chronic small vessel ischemic disease involving the periventricular white matter and pons.  MRA HEAD IMPRESSION:  1. No hemodynamically significant stenosis or arterial occlusion identified within the intracranial circulation. 2. Nonvisualization of the right vertebral artery, which may be hypoplastic or occluded in the neck. 3. Fetal origin of the posterior cerebral arteries bilaterally with markedly diminutive vertebrobasilar system. Bilateral posterior communicating arteries widely patent.   Electronically Signed   By: BJeannine BogaM.D.   On: 10/24/2014 04:00    2D Echo: none  Cardiac Cath: none  Admission HPI:    Carrie CARNEIROis a 54year old woman with history of hypertension, hyperlipidemia, type 2 diabetes with retinopathy, nonobstructive coronary artery disease, congestive heart failure, and GERD is ending with multiple complaints. She reports gradually worsening posterior headache associated with  dizziness, nausea, chest pain, and back pain. She reports the symptoms started 2 days ago after waking up in the morning. She says that she feels as though the room is spinning, and when  she stands she must hold onto something for support. The dizziness occurs at rest but is worse with movement. She says she feels like she is going to "fallout." She has had intermittent nausea, and she reports vomiting yesterday evening. She had 4-6 episodes of diarrhea 3 days ago, and reports poor by mouth intake over the last day. She does endorse a feeling of fullness in her right ear associated with decreased hearing and tinnitus. However, this is been an ongoing issue for her. She reports a 10 out of 10 headache in the occipital region of her head and 10 out of 10 back pain. She does have a history of back pain and sciatica, but she states that this pain is more severe and slightly higher. She endorses sharp left-sided chest pain as well. She denies any recent falls or injuries. She does say that she had similar symptoms 2 years ago and she was diagnosed with "narrow blood vessels." She denies recent viral illness, double vision, focal weakness, or focal sensory change.  In the ER, the patient was noted to be orthostatic and head CT was unremarkable. She was given Zofran, meclizine, morphine, and 1 L normal saline.  Hospital Course by problem list: Principal Problem:   Vertigo Active Problems:   Uncontrolled type 2 diabetes with neuropathy   Essential hypertension   GERD   Systolic CHF   Anemia   Chest pain   Excessive cerumen in right ear canal   Orthostatic hypotension   Bilateral low back pain without sciatica   Pain in the chest   Vertigo: Patient initially presenting with vertigo that was unresponsive to meclizine in the emergency department. Patient has a history of a similar admission in March 2014 when she was found to have orthostatic hypotension which resolved after hydration. Patient also has a  history of abnormal vasculature to the posterior circulation of her brain along with a fetal origin for posterior cerebral artery. Upon this hospitalization, patient received an MRI that was unremarkable for any evidence of ischemia although there was note of the stable fetal origin PCA as well as diminutive vertebrobasilar circulation. Patient also has small vessel disease noted in the pons. Patient had slight improvement by the day of discharge status post fluid resuscitation, cerumen disimpaction, and vestibular PT. Patient's orthostatic vitals were negative for hypotension. Other considerations could include Mnire's disease, labyrinthitis, and vestibular migraines. Would consider reassessment of cerumen impaction and/or referral to ENT should symptoms persist as an outpatient. Patient was discharged with Debrox eardrops and meclizine along with referral to outpatient vestibular PT. Patient's amitriptyline was discontinued and Flexeril was reduced to daily at bedtime dosing as both medications could be associated with dizziness.  Chronic back pain: Patient was continued on her home dosage of Vicodin 5-325 mg every 6 hours when necessary.  Type 2 diabetes with retinopathy: Patient with elevated glucose and a history of an elevated A1c of 8.5 from February 2016. Patient was discharged on her home dosage of metformin and Lantus 44 units daily at bedtime along with NovoLog sliding scale upon discharge.  Nonobstructive coronary artery disease: Cardiac catheterization in July 2013 showed nonobstructive disease with maximal stenosis of 50% of the LAD. Patient's home aspirin 325 mg daily was continued.  Congestive heart failure: Last echocardiogram in March 2014 was extremely limited, previous in July 2013 showed ejection fraction of 35-40% with diffuse hypokinesis. Only trace edema on exam. Patient's home Coreg and Lasix were held initially given concern for orthostatic hypotension though they were  resumed  upon discharge as patient had been properly fluid resuscitated and that there are other likely causes for patient's vertigo.  Iron deficiency anemia: Last anemia panel from 2013 and patient was continued on her home ferrous sulfate 325 mg daily. At the time of discharge, a repeat anemia panel is pending.  Glaucoma: Patient was continued on home Cosopt.  GERD: Patient was continued on home Protonix 20 mg daily.   Discharge Vitals:   BP 127/75 mmHg  Pulse 81  Temp(Src) 97.1 F (36.2 C) (Oral)  Resp 18  Ht _0  (1.676 m)  Wt 251 lb 3.2 oz (113.944 kg)  BMI 40.56 kg/m2  SpO2 100%  LMP 02/13/2011  Discharge Labs:  Results for orders placed or performed during the hospital encounter of 10/23/14 (from the past 24 hour(s))  Basic metabolic panel     Status: Abnormal   Collection Time: 10/23/14  5:58 PM  Result Value Ref Range   Sodium 136 135 - 145 mmol/L   Potassium 4.0 3.5 - 5.1 mmol/L   Chloride 99 96 - 112 mmol/L   CO2 26 19 - 32 mmol/L   Glucose, Bld 188 (H) 70 - 99 mg/dL   BUN 17 6 - 23 mg/dL   Creatinine, Ser 0.90 0.50 - 1.10 mg/dL   Calcium 9.4 8.4 - 10.5 mg/dL   GFR calc non Af Amer 71 (L) >90 mL/min   GFR calc Af Amer 82 (L) >90 mL/min   Anion gap 11 5 - 15  BNP (order ONLY if patient complains of dyspnea/SOB AND you have documented it for THIS visit)     Status: None   Collection Time: 10/23/14  5:58 PM  Result Value Ref Range   B Natriuretic Peptide 16.0 0.0 - 100.0 pg/mL  CBC with Differential     Status: Abnormal   Collection Time: 10/23/14  5:58 PM  Result Value Ref Range   WBC 13.0 (H) 4.0 - 10.5 K/uL   RBC 4.05 3.87 - 5.11 MIL/uL   Hemoglobin 11.5 (L) 12.0 - 15.0 g/dL   HCT 35.6 (L) 36.0 - 46.0 %   MCV 87.9 78.0 - 100.0 fL   MCH 28.4 26.0 - 34.0 pg   MCHC 32.3 30.0 - 36.0 g/dL   RDW 14.1 11.5 - 15.5 %   Platelets 295 150 - 400 K/uL   Neutrophils Relative % 56 43 - 77 %   Neutro Abs 7.3 1.7 - 7.7 K/uL   Lymphocytes Relative 35 12 - 46 %   Lymphs Abs  4.5 (H) 0.7 - 4.0 K/uL   Monocytes Relative 7 3 - 12 %   Monocytes Absolute 0.9 0.1 - 1.0 K/uL   Eosinophils Relative 2 0 - 5 %   Eosinophils Absolute 0.3 0.0 - 0.7 K/uL   Basophils Relative 0 0 - 1 %   Basophils Absolute 0.0 0.0 - 0.1 K/uL  I-stat troponin, ED (not at [facility])     Status: None   Collection Time: 10/23/14  6:07 PM  Result Value Ref Range   Troponin i, poc 0.01 0.00 - 0.08 ng/mL   Comment 3          I-stat troponin, ED     Status: None   Collection Time: 10/23/14  9:55 PM  Result Value Ref Range   Troponin i, poc 0.00 0.00 - 0.08 ng/mL   Comment 3          Urinalysis, Routine w reflex microscopic     Status: Abnormal  Collection Time: 10/23/14 10:20 PM  Result Value Ref Range   Color, Urine YELLOW YELLOW   APPearance CLEAR CLEAR   Specific Gravity, Urine 1.010 1.005 - 1.030   pH 5.0 5.0 - 8.0   Glucose, UA NEGATIVE NEGATIVE mg/dL   Hgb urine dipstick NEGATIVE NEGATIVE   Bilirubin Urine NEGATIVE NEGATIVE   Ketones, ur NEGATIVE NEGATIVE mg/dL   Protein, ur NEGATIVE NEGATIVE mg/dL   Urobilinogen, UA 0.2 0.0 - 1.0 mg/dL   Nitrite NEGATIVE NEGATIVE   Leukocytes, UA TRACE (A) NEGATIVE  Urine microscopic-add on     Status: Abnormal   Collection Time: 10/23/14 10:20 PM  Result Value Ref Range   Squamous Epithelial / LPF FEW (A) RARE   WBC, UA 0-2 <3 WBC/hpf   Bacteria, UA RARE RARE  CBC with Differential/Platelet     Status: Abnormal   Collection Time: 10/24/14  5:52 AM  Result Value Ref Range   WBC 10.1 4.0 - 10.5 K/uL   RBC 3.86 (L) 3.87 - 5.11 MIL/uL   Hemoglobin 10.7 (L) 12.0 - 15.0 g/dL   HCT 33.5 (L) 36.0 - 46.0 %   MCV 86.8 78.0 - 100.0 fL   MCH 27.7 26.0 - 34.0 pg   MCHC 31.9 30.0 - 36.0 g/dL   RDW 14.0 11.5 - 15.5 %   Platelets 266 150 - 400 K/uL   Neutrophils Relative % 63 43 - 77 %   Neutro Abs 6.4 1.7 - 7.7 K/uL   Lymphocytes Relative 30 12 - 46 %   Lymphs Abs 3.0 0.7 - 4.0 K/uL   Monocytes Relative 5 3 - 12 %   Monocytes Absolute 0.6 0.1 - 1.0  K/uL   Eosinophils Relative 2 0 - 5 %   Eosinophils Absolute 0.2 0.0 - 0.7 K/uL   Basophils Relative 0 0 - 1 %   Basophils Absolute 0.0 0.0 - 0.1 K/uL  Comprehensive metabolic panel     Status: Abnormal   Collection Time: 10/24/14  5:52 AM  Result Value Ref Range   Sodium 137 135 - 145 mmol/L   Potassium 3.7 3.5 - 5.1 mmol/L   Chloride 100 96 - 112 mmol/L   CO2 28 19 - 32 mmol/L   Glucose, Bld 279 (H) 70 - 99 mg/dL   BUN 13 6 - 23 mg/dL   Creatinine, Ser 1.02 0.50 - 1.10 mg/dL   Calcium 9.1 8.4 - 10.5 mg/dL   Total Protein 6.9 6.0 - 8.3 g/dL   Albumin 3.5 3.5 - 5.2 g/dL   AST 17 0 - 37 U/L   ALT 17 0 - 35 U/L   Alkaline Phosphatase 71 39 - 117 U/L   Total Bilirubin 0.5 0.3 - 1.2 mg/dL   GFR calc non Af Amer 61 (L) >90 mL/min   GFR calc Af Amer 70 (L) >90 mL/min   Anion gap 9 5 - 15  Troponin I     Status: None   Collection Time: 10/24/14  5:52 AM  Result Value Ref Range   Troponin I <0.03 <0.031 ng/mL  Ferritin     Status: None   Collection Time: 10/24/14  5:52 AM  Result Value Ref Range   Ferritin 161 10 - 291 ng/mL  Folate     Status: None   Collection Time: 10/24/14  5:52 AM  Result Value Ref Range   Folate 11.7 ng/mL  Reticulocytes     Status: Abnormal   Collection Time: 10/24/14  5:52 AM  Result Value Ref  Range   Retic Ct Pct 1.4 0.4 - 3.1 %   RBC. 3.85 (L) 3.87 - 5.11 MIL/uL   Retic Count, Manual 53.9 19.0 - 186.0 K/uL  Vitamin B12     Status: None   Collection Time: 10/24/14  5:52 AM  Result Value Ref Range   Vitamin B-12 580 211 - 911 pg/mL  Glucose, capillary     Status: Abnormal   Collection Time: 10/24/14  5:53 AM  Result Value Ref Range   Glucose-Capillary 253 (H) 70 - 99 mg/dL  Glucose, capillary     Status: Abnormal   Collection Time: 10/24/14  7:22 AM  Result Value Ref Range   Glucose-Capillary 274 (H) 70 - 99 mg/dL  Glucose, capillary     Status: Abnormal   Collection Time: 10/24/14 11:21 AM  Result Value Ref Range   Glucose-Capillary 264 (H)  70 - 99 mg/dL    Signed: Luan Moore, MD 10/24/2014, 2:35 PM    Services Ordered on Discharge: none Equipment Ordered on Discharge: none

## 2014-10-24 NOTE — Progress Notes (Signed)
Tech offered Pt.a bath. Pt. stated she is waiting on daughter to bring her some personal items and will do bath when daughter gets here.

## 2014-10-24 NOTE — Progress Notes (Signed)
Report received from [place], RN from [name].

## 2014-10-24 NOTE — Evaluation (Signed)
Physical Therapy Evaluation Patient Details Name: [name] MRN: [id number] DOB: July 22, 1960 Today's Date: 10/24/2014   History of Present Illness  Patient is a 54 year old woman with a history of hypertension, hyperlipidemia, type 2 diabetes with retinopathy, nonobstructive coronary artery disease, congestive heart failure, and GERD who was admitted for vertigo.  Clinical Impression  Pt admitted with above diagnosis. Pt currently with functional limitations due to the deficits listed below (see PT Problem List). Pt able to ambulate with RW safely.  Can f/u with Outpt. PT.  Did not set goals as pt going home today.   Pt will benefit from skilled PT at [facility] to decrease dizziness and increase safety with mobility.      Follow Up Recommendations Outpatient PT;Supervision - Intermittent (for vestibular rehab)    Equipment Recommendations  Other (comment) (wants rollator but hasnt been long since  she got RW)    Recommendations for Other Services       Precautions / Restrictions Precautions Precautions: Fall Restrictions Weight Bearing Restrictions: No      Mobility  Bed Mobility Overal bed mobility: Needs Assistance Bed Mobility: Rolling;Sidelying to Sit Rolling: Min guard Sidelying to sit: Min guard       General bed mobility comments: Incr time and uses momentum.  Transfers Overall transfer level: Needs assistance Equipment used: None Transfers: Sit to/from Stand Sit to Stand: Supervision         General transfer comment: Pt steady static stance without UE support.   Ambulation/Gait Ambulation/Gait assistance: Min guard Ambulation Distance (Feet): 20 Feet Assistive device: Rolling walker (2 wheeled) Gait Pattern/deviations: Step-through pattern;Decreased stride length   Gait velocity interpretation: Below normal speed for age/gender General Gait Details: Pt ambulates wtih RW with good steadiness on feet.   Stairs            Wheelchair  Mobility    Modified Rankin (Stroke Patients Only)       Balance                                             Pertinent Vitals/Pain Pain Assessment: No/denies pain  VSS    Home Living Family/patient expects to be discharged to:: Private residence Living Arrangements: Children (daughter goes to work at 34pm, son 15 years old ) Available Help at Discharge: Family;Available PRN/intermittently;Personal care attendant (aide M-Sun 9-11am) Type of Home: House Home Access: Stairs to enter Entrance Stairs-Rails: Right;Left;Can reach both Entrance Stairs-Number of Steps: 9 Home Layout: One level Home Equipment: Walker - 2 wheels;Bedside commode;Tub bench;Wheelchair - manual      Prior Function Level of Independence: Independent with assistive device(s)               Hand Dominance        Extremity/Trunk Assessment   Upper Extremity Assessment: Defer to OT evaluation           Lower Extremity Assessment: Generalized weakness         Communication   Communication: No difficulties  Cognition Arousal/Alertness: Awake/alert Behavior During Therapy: Flat affect Overall Cognitive Status: Within Functional Limits for tasks assessed                      General Comments General comments (skin integrity, edema, etc.): Pt tested negative for supine head roll bil.  Tested negative for left post canal BPPV however tested positive for  right posterior canal BPPV therefoere treated with Epley maneuver.  Pt reports feeling a little better affter treatment with dizziness intiially 8/10 and then 5/10 after treatment.  Told pt to not lay flat for 24 hours.  Pt understands.  Suspect hypofunction as well but will need to reassess at next visit.  Pt plans to d/c home therefore Outpt PT can continue to treat for vestibular rehab.     Exercises        Assessment/Plan    PT Assessment All further PT needs can be met in the next venue of care  PT Diagnosis  Generalized weakness (dizziness)   PT Problem List Decreased mobility;Decreased coordination;Decreased activity tolerance (dizziness)  PT Treatment Interventions     PT Goals (Current goals can be found in the Care Plan section) Acute Rehab PT Goals PT Goal Formulation: All assessment and education complete, DC therapy    Frequency     Barriers to discharge        Co-evaluation               End of Session Equipment Utilized During Treatment: Gait belt Activity Tolerance: Patient limited by fatigue (limited by dizziness) Patient left: in bed;with call bell/phone within reach;with family/visitor present;with bed alarm set Nurse Communication: Mobility status    Functional Assessment Tool Used: clinical judgment Functional Limitation: Mobility: Walking and moving around Mobility: Walking and Moving Around Current Status (M7615): At least 1 percent but less than 20 percent impaired, limited or restricted Mobility: Walking and Moving Around Goal Status [phone]): At least 1 percent but less than 20 percent impaired, limited or restricted Mobility: Walking and Moving Around Discharge Status [phone]): At least 1 percent but less than 20 percent impaired, limited or restricted    Time: 1102-1129 PT Time Calculation (min) (ACUTE ONLY): 27 min   Charges:   PT Evaluation $Initial PT Evaluation Tier I: 1 Procedure PT Treatments $Canalith Rep Proc: 8-22 mins   PT G Codes:   PT G-Codes **NOT FOR INPATIENT CLASS** Functional Assessment Tool Used: clinical judgment Functional Limitation: Mobility: Walking and moving around Mobility: Walking and Moving Around Current Status (X7847): At least 1 percent but less than 20 percent impaired, limited or restricted Mobility: Walking and Moving Around Goal Status [phone]): At least 1 percent but less than 20 percent impaired, limited or restricted Mobility: Walking and Moving Around Discharge Status [phone]): At least 1 percent but less than 20  percent impaired, limited or restricted    Denice Paradise 10/24/2014, 4:19 PM  [place] Kimbra Marcelino,PT Acute Rehabilitation [phone] (pager)

## 2014-10-24 NOTE — Progress Notes (Signed)
Patient taken out of hospital for discharge via wheelchair.  

## 2014-10-24 NOTE — ED Notes (Signed)
Ambulated with the patient in the hallway, approximately 258ft, 3 episodes of dizziness and loss of balance. Reported to [place], [place].

## 2014-10-24 NOTE — ED Notes (Signed)
Admitting MD at the bedside.  

## 2014-10-24 NOTE — Progress Notes (Signed)
UR completed 

## 2014-10-24 NOTE — ED Notes (Signed)
Phlebotomy came to see patient, currently in MRI.

## 2014-10-27 NOTE — Addendum Note (Signed)
Addended by: Marcelino Duster on: 10/27/2014 10:47 AM   Modules accepted: Orders

## 2014-10-28 ENCOUNTER — Other Ambulatory Visit: Payer: Self-pay | Admitting: Internal Medicine

## 2014-10-31 ENCOUNTER — Encounter: Payer: Self-pay | Admitting: Internal Medicine

## 2014-10-31 ENCOUNTER — Ambulatory Visit (INDEPENDENT_AMBULATORY_CARE_PROVIDER_SITE_OTHER): Payer: Medicare Other | Admitting: Internal Medicine

## 2014-10-31 VITALS — BP 157/73 | HR 102 | Temp 97.9°F | Ht 66.0 in | Wt 256.0 lb

## 2014-10-31 DIAGNOSIS — Z794 Long term (current) use of insulin: Secondary | ICD-10-CM

## 2014-10-31 DIAGNOSIS — IMO0002 Reserved for concepts with insufficient information to code with codable children: Secondary | ICD-10-CM

## 2014-10-31 DIAGNOSIS — R42 Dizziness and giddiness: Secondary | ICD-10-CM | POA: Diagnosis not present

## 2014-10-31 DIAGNOSIS — M545 Low back pain: Secondary | ICD-10-CM | POA: Diagnosis not present

## 2014-10-31 DIAGNOSIS — I10 Essential (primary) hypertension: Secondary | ICD-10-CM

## 2014-10-31 DIAGNOSIS — G8929 Other chronic pain: Secondary | ICD-10-CM | POA: Diagnosis not present

## 2014-10-31 DIAGNOSIS — E1140 Type 2 diabetes mellitus with diabetic neuropathy, unspecified: Secondary | ICD-10-CM | POA: Diagnosis not present

## 2014-10-31 DIAGNOSIS — E1165 Type 2 diabetes mellitus with hyperglycemia: Secondary | ICD-10-CM | POA: Diagnosis not present

## 2014-10-31 DIAGNOSIS — M5440 Lumbago with sciatica, unspecified side: Secondary | ICD-10-CM

## 2014-10-31 DIAGNOSIS — G4700 Insomnia, unspecified: Secondary | ICD-10-CM

## 2014-10-31 LAB — GLUCOSE, CAPILLARY: Glucose-Capillary: 244 mg/dL — ABNORMAL HIGH (ref 70–99)

## 2014-10-31 LAB — POCT GLYCOSYLATED HEMOGLOBIN (HGB A1C): HEMOGLOBIN A1C: 8.4

## 2014-10-31 MED ORDER — HYDROCODONE-ACETAMINOPHEN 5-325 MG PO TABS: 1.0000 | ORAL_TABLET | Freq: Four times a day (QID) | ORAL | Status: DC | PRN

## 2014-10-31 MED ORDER — GABAPENTIN 300 MG PO CAPS: 300.0000 mg | ORAL_CAPSULE | Freq: Three times a day (TID) | ORAL | Status: DC

## 2014-10-31 MED ORDER — FUROSEMIDE 40 MG PO TABS: 20.0000 mg | ORAL_TABLET | Freq: Every day | ORAL | Status: DC

## 2014-10-31 NOTE — Progress Notes (Signed)
   Subjective:    Patient ID: [name], female    DOB: 1961-01-27, 54 y.o.   MRN: [id number]  HPI  54 yo female with HTN, non obstructive CAD max stenosis 50% LAD on cath 07/4399, systolic CHF EF 02-72%, HLD, uncontrolled DM II, chronic back pain, vertigo, admitted to the hospital for vertigo 4/28-4/29/16.    Has hx of vertigo, unresponsive to meclizine in ED. Also had orthostatic hypotension and abnormal posterior circulation of the brain. Has small vessel disease of the pons. She improved in the hospital with fluids, cerumen disimpaction, and vestibualr PT. Inpatient team recommended re-val for cerumen impaction and/or referral to ENT if symptoms persist.  - patient states dizziness has improved significantly but still has some occasionally. Using her ear drops to reduce wax and also using meclizine. Now only complaint is insomnia, not able to fall asleep or stay asleep. Has daytime sleepiness. Wants something for it. Has headache from not sleeping.  Continues to have chronic back pain and asking for her norco refill.   See problem based a&p.  Review of Systems  Constitutional: Negative for fever and chills.  HENT: Negative for congestion, sinus pressure and sore throat.   Respiratory: Negative for cough, shortness of breath and wheezing.   Cardiovascular: Negative for chest pain, palpitations and leg swelling.  Gastrointestinal: Negative for nausea, vomiting, diarrhea and abdominal distention.  Endocrine: Negative.   Genitourinary: Negative for dysuria and flank pain.  Musculoskeletal: Positive for back pain. Negative for myalgias, joint swelling and neck pain.  Skin: Negative.   Allergic/Immunologic: Negative.   Neurological: Positive for dizziness and headaches. Negative for weakness, light-headedness and numbness.       Objective:   Physical Exam  Constitutional: She is oriented to person, place, and time. She appears well-developed and well-nourished. No distress.    Overweight female.  HENT:  Head: Normocephalic and atraumatic.  Right Ear: External ear normal.  Left Ear: External ear normal.  Mouth/Throat: Oropharynx is clear and moist. No oropharyngeal exudate.  Eyes: Conjunctivae are normal. Pupils are equal, round, and reactive to light. Right eye exhibits no discharge. Left eye exhibits no discharge. No scleral icterus.  Cardiovascular: Normal rate, regular rhythm and normal heart sounds.  Exam reveals no gallop and no friction rub.   No murmur heard. Pulmonary/Chest: Effort normal and breath sounds normal. No respiratory distress. She has no wheezes. She has no rales. She exhibits no tenderness.  Abdominal: Soft. Bowel sounds are normal. She exhibits no distension. There is no tenderness. There is no rebound and no guarding.  Musculoskeletal: Normal range of motion. She exhibits no edema or tenderness.  Neurological: She is alert and oriented to person, place, and time. No cranial nerve deficit. Coordination normal.  Skin: Skin is warm. She is not diaphoretic.  Psychiatric: She has a normal mood and affect.   Filed Vitals:   10/31/14 1403  BP: 157/73  Pulse: 102  Temp: 97.9 F (36.6 C)        Assessment & Plan:  See problem based a&p.

## 2014-10-31 NOTE — Assessment & Plan Note (Addendum)
Currently on coreg 6.25mg  BID, lasix 20 mg daily, lisinopril 10mg  daily.  BP was well controlled last few visits, today it's slightly elevated likely because she did not take her BP med this morning and also maybe 2/2 to her insomnia.   Filed Vitals:   10/31/14 1403  BP: 157/73  Pulse: 102  Temp: 97.9 F (36.6 C)    Will cont same regimen for now. Will recheck next visit in 2 weeks and make adjustments if needed.

## 2014-10-31 NOTE — Assessment & Plan Note (Addendum)
Currently on lantus 44 qhs + metformin 1000mg  BID and SSI.  hgba1c unchanged from prior, 8.5 on 2/16, today 8.4.  CBG 244.   Patient did not bring her glucometer again. She stated she has both high and low blood sugars. Has episodes of hypoglycemia in 60's.   I don't feel making any adjustment to her medication without her glucose readings. Asked her to follow up in 2 weeks with her meter. Asked her to continue same med regimen for now.  She stated she eats honey buns. I told her to stop doing this. I put in a referral to [facility] to help her with her diet.

## 2014-10-31 NOTE — Assessment & Plan Note (Signed)
Has difficulty falling and staying asleep since discharge from the hospital. Asking for sleeping med.  I told her I don't feel safe giving her anything given the fact that she is already on norco and also has her vertigo. There is risk of increasing sedation and also increasing risk of fall/vertigo. Patient was not satisfied with my plan and wanted something to help [place]. I therefore went up on her gabapentin from 300mg  BID to 300mg  TID as I felt this would be the safest change of med rather than adding a new medication.  I asked her to cut down her caffeine intake (she drinks diet mountain dew). Asked her to exercise.

## 2014-10-31 NOTE — Patient Instructions (Signed)
I increased your gabapentin to three times a day. This may help with your sleeping.  Stop taking caffeine.   Please bring your glucose meter and follow up in 2 weeks for your diabetes.

## 2014-10-31 NOTE — Assessment & Plan Note (Signed)
Has chronic lumbar back pain. Dr. Raelene Bott filled her script of norco for 3 months. She is due for another refill now.  I will fill for 1 month. norco 5-325mg  #90 tabs for 30 days.

## 2014-10-31 NOTE — Assessment & Plan Note (Addendum)
Here for hospital follow up when she was admitted with vertigo.  In the hospital, she was found to have cerumen impaction which was disimpacted. she also had orthostatic hypotension which improved with fluids. On MRA it showed that she has abnormal posterior circulation of the brain. Please see MRA results from 10/24/2014.   (Basilar artery is somewhat diminutive but patent. There is fetal origin of the posterior cerebral arteries bilaterally with widely patent posterior communicating arteries. Has small vessel disease of the pons. )  She improved in the hospital with fluids, cerumen disimpaction, and vestibualr PT. Inpatient team recommended re-val for cerumen impaction and/or referral to ENT if symtpoms persist. --------------   Today patient feels better. Negative orthostatic vitals.  She is on meclizine. Will cont this.   No cerumen impaction on exam. Asked her to f/up as needed if dizziness worsens.

## 2014-11-03 NOTE — Progress Notes (Signed)
INTERNAL MEDICINE TEACHING ATTENDING ADDENDUM - Nazia Rhines, MD: I reviewed and discussed at the time of visit with the resident Dr. Ahmed, the patient's medical history, physical examination, diagnosis and results of pertinent tests and treatment and I agree with the patient's care as documented.  

## 2014-11-04 ENCOUNTER — Encounter: Payer: Self-pay | Admitting: *Deleted

## 2014-11-18 ENCOUNTER — Ambulatory Visit: Payer: Medicare Other | Admitting: Internal Medicine

## 2014-11-18 ENCOUNTER — Encounter: Payer: Self-pay | Admitting: Internal Medicine

## 2014-11-20 ENCOUNTER — Other Ambulatory Visit: Payer: Self-pay | Admitting: Internal Medicine

## 2014-11-21 ENCOUNTER — Other Ambulatory Visit: Payer: Self-pay | Admitting: Internal Medicine

## 2014-11-22 ENCOUNTER — Other Ambulatory Visit: Payer: Self-pay | Admitting: Internal Medicine

## 2014-11-25 LAB — HM DIABETES EYE EXAM

## 2014-11-30 DIAGNOSIS — G609 Hereditary and idiopathic neuropathy, unspecified: Secondary | ICD-10-CM | POA: Diagnosis not present

## 2014-12-01 DIAGNOSIS — G609 Hereditary and idiopathic neuropathy, unspecified: Secondary | ICD-10-CM | POA: Diagnosis not present

## 2014-12-02 DIAGNOSIS — G609 Hereditary and idiopathic neuropathy, unspecified: Secondary | ICD-10-CM | POA: Diagnosis not present

## 2014-12-03 DIAGNOSIS — G609 Hereditary and idiopathic neuropathy, unspecified: Secondary | ICD-10-CM | POA: Diagnosis not present

## 2014-12-04 ENCOUNTER — Ambulatory Visit (INDEPENDENT_AMBULATORY_CARE_PROVIDER_SITE_OTHER): Payer: Commercial Managed Care - HMO | Admitting: Internal Medicine

## 2014-12-04 ENCOUNTER — Encounter: Payer: Self-pay | Admitting: Internal Medicine

## 2014-12-04 VITALS — BP 157/75 | HR 116 | Temp 98.2°F | Ht 66.0 in | Wt 259.3 lb

## 2014-12-04 DIAGNOSIS — M79601 Pain in right arm: Secondary | ICD-10-CM | POA: Diagnosis not present

## 2014-12-04 DIAGNOSIS — IMO0002 Reserved for concepts with insufficient information to code with codable children: Secondary | ICD-10-CM

## 2014-12-04 DIAGNOSIS — E1140 Type 2 diabetes mellitus with diabetic neuropathy, unspecified: Secondary | ICD-10-CM

## 2014-12-04 DIAGNOSIS — E1165 Type 2 diabetes mellitus with hyperglycemia: Secondary | ICD-10-CM

## 2014-12-04 DIAGNOSIS — E785 Hyperlipidemia, unspecified: Secondary | ICD-10-CM | POA: Diagnosis not present

## 2014-12-04 DIAGNOSIS — G609 Hereditary and idiopathic neuropathy, unspecified: Secondary | ICD-10-CM | POA: Diagnosis not present

## 2014-12-04 DIAGNOSIS — I10 Essential (primary) hypertension: Secondary | ICD-10-CM

## 2014-12-04 DIAGNOSIS — I2510 Atherosclerotic heart disease of native coronary artery without angina pectoris: Secondary | ICD-10-CM

## 2014-12-04 LAB — GLUCOSE, CAPILLARY: Glucose-Capillary: 177 mg/dL — ABNORMAL HIGH (ref 65–99)

## 2014-12-04 MED ORDER — HYDROCODONE-ACETAMINOPHEN 5-325 MG PO TABS: 1.0000 | ORAL_TABLET | Freq: Four times a day (QID) | ORAL | Status: DC | PRN

## 2014-12-04 MED ORDER — CARVEDILOL 6.25 MG PO TABS: 6.2500 mg | ORAL_TABLET | Freq: Two times a day (BID) | ORAL | Status: DC

## 2014-12-04 MED ORDER — PRAVASTATIN SODIUM 20 MG PO TABS: 20.0000 mg | ORAL_TABLET | Freq: Every day | ORAL | Status: DC

## 2014-12-04 MED ORDER — LISINOPRIL 20 MG PO TABS: 10.0000 mg | ORAL_TABLET | Freq: Every day | ORAL | Status: DC

## 2014-12-04 NOTE — Assessment & Plan Note (Signed)
BP Readings from Last 3 Encounters:  12/04/14 157/75  10/31/14 157/73  10/24/14 127/75    Lab Results  Component Value Date   NA 137 10/24/2014   K 3.7 10/24/2014   CREATININE 1.02 10/24/2014    Assessment: Blood pressure control: mildly elevated Progress toward BP goal:  unchanged Comments: Patient states that she has not been taking her medicines lately because she has run out. She has also been having significant arm pain. No changes to her medications at this point given confounds.  Plan: Medications:  continue current medications Educational resources provided: brochure (denies) Self management tools provided:   Other plans: Return to clinic in one month for blood pressure check

## 2014-12-04 NOTE — Assessment & Plan Note (Signed)
Status post fall from a stool. Patient believed that there was a back to the chair when there wasn't. She fell to the floor without hitting any other parts of her body besides landing on her right arm. She states that she did not have pain initially but then developed 7 out of 10 aching pain throughout her arm after one day. She maintains normal function throughout her arm and denies any numbness or tingling in her hand. Physical exam unremarkable for any bony abnormalities or significant tenderness. Range of motion intact in the elbow and shoulder joint on passive and active motion. -May continue prescribed Norco for back pain for her arm pain -Avoid naproxen given glaucoma and heart history, taking this off her medication list.

## 2014-12-04 NOTE — Progress Notes (Signed)
   Subjective:    Patient ID: [name], female    DOB: 19-Aug-1960, 54 y.o.   MRN: [id number]  HPI  Patient is a 54 year old female with a history of congestive heart failure, hypertension, coronary artery disease, type 2 diabetes, chronic back pain, GERD, hyperlipidemia presents to clinic for arm pain status post fall.  Please see problem based charting for more details.  Review of Systems  Constitutional: Negative for fever and chills.  HENT: Negative for rhinorrhea and sore throat.   Eyes: Negative for visual disturbance.  Respiratory: Negative for cough and shortness of breath.   Cardiovascular: Negative for chest pain and palpitations.  Gastrointestinal: Negative for nausea, vomiting, abdominal pain, diarrhea, constipation and blood in stool.  Genitourinary: Negative for dysuria and hematuria.  Musculoskeletal:       Arm pain  Neurological: Negative for syncope.       Objective:   Physical Exam  Constitutional: She is oriented to person, place, and time. She appears well-developed and well-nourished. No distress.  HENT:  Head: Normocephalic and atraumatic.  Eyes: EOM are normal. Pupils are equal, round, and reactive to light.  Neck: Normal range of motion. Neck supple. No thyromegaly present.  Cardiovascular: Normal rate and regular rhythm.  Exam reveals no gallop and no friction rub.   No murmur heard. Pulmonary/Chest: Effort normal and breath sounds normal. No respiratory distress. She has no wheezes. She has no rales.  Abdominal: Soft. Bowel sounds are normal. She exhibits no distension. There is no tenderness. There is no rebound.  Musculoskeletal: She exhibits no edema.  Right arm: No gross abnormalities at the shoulder or elbow joint, normal range of motion passive and active at the shoulder and elbow. Normal strength at the shoulder and elbow. Sensation intact throughout the arm.  Neurological: She is alert and oriented to person, place, and time. No cranial  nerve deficit.  Skin: Skin is warm and dry. No rash noted.  Psychiatric: She has a normal mood and affect. Thought content normal.          Assessment & Plan:  Please see problem based charting for more details.

## 2014-12-04 NOTE — Assessment & Plan Note (Signed)
Patient requesting annual lipid profile to be deferred to next visit.

## 2014-12-04 NOTE — Patient Instructions (Addendum)
You can continue taking your Norco to help with your arm pain. I would avoid naproxen for now while you're being treated for glaucoma. I do not believe that you have a more serious injury in your arm that would require more testing. Please follow-up in one month to check on your blood pressure.

## 2014-12-04 NOTE — Progress Notes (Signed)
Internal Medicine Clinic Attending  Case discussed with Dr. Ngo at the time of the visit.  We reviewed the resident's history and exam and pertinent patient test results.  I agree with the assessment, diagnosis, and plan of care documented in the resident's note. 

## 2014-12-05 DIAGNOSIS — G609 Hereditary and idiopathic neuropathy, unspecified: Secondary | ICD-10-CM | POA: Diagnosis not present

## 2014-12-06 DIAGNOSIS — G609 Hereditary and idiopathic neuropathy, unspecified: Secondary | ICD-10-CM | POA: Diagnosis not present

## 2014-12-07 DIAGNOSIS — G609 Hereditary and idiopathic neuropathy, unspecified: Secondary | ICD-10-CM | POA: Diagnosis not present

## 2014-12-08 DIAGNOSIS — G609 Hereditary and idiopathic neuropathy, unspecified: Secondary | ICD-10-CM | POA: Diagnosis not present

## 2014-12-09 DIAGNOSIS — G609 Hereditary and idiopathic neuropathy, unspecified: Secondary | ICD-10-CM | POA: Diagnosis not present

## 2014-12-10 DIAGNOSIS — G609 Hereditary and idiopathic neuropathy, unspecified: Secondary | ICD-10-CM | POA: Diagnosis not present

## 2014-12-11 DIAGNOSIS — G609 Hereditary and idiopathic neuropathy, unspecified: Secondary | ICD-10-CM | POA: Diagnosis not present

## 2014-12-12 DIAGNOSIS — G609 Hereditary and idiopathic neuropathy, unspecified: Secondary | ICD-10-CM | POA: Diagnosis not present

## 2014-12-13 DIAGNOSIS — G609 Hereditary and idiopathic neuropathy, unspecified: Secondary | ICD-10-CM | POA: Diagnosis not present

## 2014-12-14 DIAGNOSIS — G609 Hereditary and idiopathic neuropathy, unspecified: Secondary | ICD-10-CM | POA: Diagnosis not present

## 2014-12-15 DIAGNOSIS — G609 Hereditary and idiopathic neuropathy, unspecified: Secondary | ICD-10-CM | POA: Diagnosis not present

## 2014-12-16 DIAGNOSIS — G609 Hereditary and idiopathic neuropathy, unspecified: Secondary | ICD-10-CM | POA: Diagnosis not present

## 2014-12-17 DIAGNOSIS — G609 Hereditary and idiopathic neuropathy, unspecified: Secondary | ICD-10-CM | POA: Diagnosis not present

## 2014-12-18 ENCOUNTER — Other Ambulatory Visit: Payer: Self-pay | Admitting: Internal Medicine

## 2014-12-18 DIAGNOSIS — G609 Hereditary and idiopathic neuropathy, unspecified: Secondary | ICD-10-CM | POA: Diagnosis not present

## 2014-12-19 DIAGNOSIS — G609 Hereditary and idiopathic neuropathy, unspecified: Secondary | ICD-10-CM | POA: Diagnosis not present

## 2014-12-20 ENCOUNTER — Other Ambulatory Visit: Payer: Self-pay | Admitting: Internal Medicine

## 2014-12-20 DIAGNOSIS — G609 Hereditary and idiopathic neuropathy, unspecified: Secondary | ICD-10-CM | POA: Diagnosis not present

## 2014-12-21 DIAGNOSIS — G609 Hereditary and idiopathic neuropathy, unspecified: Secondary | ICD-10-CM | POA: Diagnosis not present

## 2014-12-22 ENCOUNTER — Other Ambulatory Visit: Payer: Self-pay | Admitting: Internal Medicine

## 2014-12-22 DIAGNOSIS — E1140 Type 2 diabetes mellitus with diabetic neuropathy, unspecified: Secondary | ICD-10-CM

## 2014-12-22 DIAGNOSIS — IMO0002 Reserved for concepts with insufficient information to code with codable children: Secondary | ICD-10-CM

## 2014-12-22 DIAGNOSIS — G609 Hereditary and idiopathic neuropathy, unspecified: Secondary | ICD-10-CM | POA: Diagnosis not present

## 2014-12-22 DIAGNOSIS — E1165 Type 2 diabetes mellitus with hyperglycemia: Secondary | ICD-10-CM

## 2014-12-23 DIAGNOSIS — G609 Hereditary and idiopathic neuropathy, unspecified: Secondary | ICD-10-CM | POA: Diagnosis not present

## 2014-12-24 DIAGNOSIS — G609 Hereditary and idiopathic neuropathy, unspecified: Secondary | ICD-10-CM | POA: Diagnosis not present

## 2014-12-25 DIAGNOSIS — G609 Hereditary and idiopathic neuropathy, unspecified: Secondary | ICD-10-CM | POA: Diagnosis not present

## 2014-12-26 DIAGNOSIS — G609 Hereditary and idiopathic neuropathy, unspecified: Secondary | ICD-10-CM | POA: Diagnosis not present

## 2014-12-27 DIAGNOSIS — G609 Hereditary and idiopathic neuropathy, unspecified: Secondary | ICD-10-CM | POA: Diagnosis not present

## 2014-12-28 DIAGNOSIS — G609 Hereditary and idiopathic neuropathy, unspecified: Secondary | ICD-10-CM | POA: Diagnosis not present

## 2014-12-29 DIAGNOSIS — G609 Hereditary and idiopathic neuropathy, unspecified: Secondary | ICD-10-CM | POA: Diagnosis not present

## 2014-12-30 DIAGNOSIS — G609 Hereditary and idiopathic neuropathy, unspecified: Secondary | ICD-10-CM | POA: Diagnosis not present

## 2014-12-30 DIAGNOSIS — H26491 Other secondary cataract, right eye: Secondary | ICD-10-CM | POA: Diagnosis not present

## 2014-12-31 DIAGNOSIS — G609 Hereditary and idiopathic neuropathy, unspecified: Secondary | ICD-10-CM | POA: Diagnosis not present

## 2015-01-01 DIAGNOSIS — G609 Hereditary and idiopathic neuropathy, unspecified: Secondary | ICD-10-CM | POA: Diagnosis not present

## 2015-01-02 ENCOUNTER — Telehealth: Payer: Self-pay | Admitting: Internal Medicine

## 2015-01-02 DIAGNOSIS — G609 Hereditary and idiopathic neuropathy, unspecified: Secondary | ICD-10-CM | POA: Diagnosis not present

## 2015-01-02 NOTE — Telephone Encounter (Signed)
Call to patient to confirm appointment for 01/05/15 at 8:30 no answer

## 2015-01-03 DIAGNOSIS — G609 Hereditary and idiopathic neuropathy, unspecified: Secondary | ICD-10-CM | POA: Diagnosis not present

## 2015-01-04 DIAGNOSIS — G609 Hereditary and idiopathic neuropathy, unspecified: Secondary | ICD-10-CM | POA: Diagnosis not present

## 2015-01-05 ENCOUNTER — Encounter: Payer: Self-pay | Admitting: Internal Medicine

## 2015-01-05 ENCOUNTER — Ambulatory Visit (INDEPENDENT_AMBULATORY_CARE_PROVIDER_SITE_OTHER): Payer: Commercial Managed Care - HMO | Admitting: Internal Medicine

## 2015-01-05 VITALS — BP 128/60 | HR 90 | Temp 98.5°F | Ht 66.0 in | Wt 261.4 lb

## 2015-01-05 DIAGNOSIS — Z794 Long term (current) use of insulin: Secondary | ICD-10-CM | POA: Diagnosis not present

## 2015-01-05 DIAGNOSIS — E1165 Type 2 diabetes mellitus with hyperglycemia: Secondary | ICD-10-CM

## 2015-01-05 DIAGNOSIS — I10 Essential (primary) hypertension: Secondary | ICD-10-CM | POA: Diagnosis not present

## 2015-01-05 DIAGNOSIS — IMO0002 Reserved for concepts with insufficient information to code with codable children: Secondary | ICD-10-CM

## 2015-01-05 DIAGNOSIS — Z79899 Other long term (current) drug therapy: Secondary | ICD-10-CM | POA: Diagnosis not present

## 2015-01-05 DIAGNOSIS — E1140 Type 2 diabetes mellitus with diabetic neuropathy, unspecified: Secondary | ICD-10-CM | POA: Diagnosis not present

## 2015-01-05 DIAGNOSIS — Z0289 Encounter for other administrative examinations: Secondary | ICD-10-CM

## 2015-01-05 DIAGNOSIS — M545 Low back pain: Secondary | ICD-10-CM

## 2015-01-05 DIAGNOSIS — E785 Hyperlipidemia, unspecified: Secondary | ICD-10-CM | POA: Diagnosis not present

## 2015-01-05 DIAGNOSIS — G609 Hereditary and idiopathic neuropathy, unspecified: Secondary | ICD-10-CM | POA: Diagnosis not present

## 2015-01-05 LAB — LIPID PANEL
Cholesterol: 155 mg/dL (ref 0–200)
HDL: 54 mg/dL (ref >=46)
LDL Cholesterol: 84 mg/dL (ref 0–99)
Total CHOL/HDL Ratio: 2.9 Ratio
Triglycerides: 84 mg/dL (ref <150)
VLDL: 17 mg/dL (ref 0–40)

## 2015-01-05 LAB — GLUCOSE, CAPILLARY: Glucose-Capillary: 106 mg/dL — ABNORMAL HIGH (ref 65–99)

## 2015-01-05 MED ORDER — HYDROCODONE-ACETAMINOPHEN 5-325 MG PO TABS
1.0000 | ORAL_TABLET | Freq: Four times a day (QID) | ORAL | Status: DC | PRN
Start: 2015-01-05 — End: 2015-02-19

## 2015-01-05 MED ORDER — AMITRIPTYLINE HCL 25 MG PO TABS: 25.0000 mg | ORAL_TABLET | Freq: Every day | ORAL | Status: DC

## 2015-01-05 NOTE — Assessment & Plan Note (Addendum)
Patient signed pain contract in 07/2014. She reports that she is out of her Norco and needs a refill. We discussed that she will need to have a urine drug screen today for [place] to refill her Norco and she agreed. Will give prescription for Norco/Vicodin 5-325 mg (#90 pills) today.

## 2015-01-05 NOTE — Progress Notes (Signed)
Patient ID: [name], female   DOB: Feb 26, 1961, 54 y.o.   MRN: [id number]   Subjective:   Patient ID: [name] female   DOB: [birth year] 54 y.o.   MRN: [id number]  HPI: [name] is a 54 y.o. lady with PMH of CHF, HTN, CAD, Type II DM, hyperlipidemia, and chronic back pain. She is here for a follow up visit for blood pressure management as well as refill of medications.    Past Medical History  Diagnosis Date  . Hypertension   . Hyperlipidemia   . GERD (gastroesophageal reflux disease)   . Adhesive capsulitis of shoulder     bilateral, Steroid injection Dr. Truman Hayward 1/12 bilaterally  . Peripheral neuropathy   . Adhesive capsulitis of right shoulder     Steroid injection Dr. Truman Hayward 1/12  . Adhesive capsulitis of left shoulder     Steroid injection Dr. Truman Hayward 1/12  . Obesity   . Diabetic retinopathy   . Glaucoma   . Diabetes mellitus     Type II, insulin dependent  . CAD (coronary artery disease)     nonobstructive. Last cardiac cath (2008) showing left circumflex with mid 50% stenosis and distal luminal irregularities. Also with RCA with mid to distal 30-40% stenosis. // Previously evaluated by [name], never followed up outpatient.  . CHF (congestive heart failure)   . CAP (community acquired pneumonia) 06/15/2012    05/2012 CXR: Mild opacification of the posterior lung base on the lateral film  as cannot exclude infection/atelectasis.     Current Outpatient Prescriptions  Medication Sig Dispense Refill  . amitriptyline (ELAVIL) 25 MG tablet Take 1 tablet (25 mg total) by mouth at bedtime. 90 tablet 0  . aspirin EC 325 MG EC tablet Take 1 tablet (325 mg total) by mouth daily. 30 tablet 0  . Blood Glucose Monitoring Suppl (ONETOUCH VERIO IQ SYSTEM) W/DEVICE KIT Check blood sugar as instructed up to 3 times a day 1 kit 0  . carvedilol (COREG) 6.25 MG tablet Take 1 tablet (6.25 mg total) by mouth 2 (two) times daily. 60 tablet 11  . cyclobenzaprine (FLEXERIL)  5 MG tablet Take 1 tablet (5 mg total) by mouth at bedtime. 270 tablet 1  . dorzolamide-timolol (COSOPT) 22.3-6.8 MG/ML ophthalmic solution Place 1 drop into the right eye 2 (two) times daily.    . ferrous sulfate 325 (65 FE) MG tablet Take 1 tablet (325 mg total) by mouth daily with breakfast. 30 tablet 11  . furosemide (LASIX) 40 MG tablet Take 0.5 tablets (20 mg total) by mouth daily. 30 tablet 3  . gabapentin (NEURONTIN) 300 MG capsule TAKE 1 CAPSULE THREE TIMES DAILY 270 capsule 0  . glucose blood (ONETOUCH VERIO) test strip Check blood sugar as instructed up to 3 times a day 100 each 12  . HYDROcodone-acetaminophen (NORCO/VICODIN) 5-325 MG per tablet Take 1 tablet by mouth every 6 (six) hours as needed for moderate pain (pain). 90 tablet 0  . Insulin Pen Needle (B-D UF III MINI PEN NEEDLES) 31G X 5 MM MISC Use to inject insulin 4-5 times daily 200 each 5  . LANTUS SOLOSTAR 100 UNIT/ML Solostar Pen INJECT 44 UNITS UNDER THE SKIN AT BEDTIME 30 mL 0  . lisinopril (PRINIVIL,ZESTRIL) 20 MG tablet Take 0.5 tablets (10 mg total) by mouth daily. 90 tablet 3  . meclizine (ANTIVERT) 50 MG tablet Take 1 tablet (50 mg total) by mouth 2 (two) times daily. 30 tablet 0  . metFORMIN (GLUCOPHAGE)  1000 MG tablet TAKE 1 TABLET BY MOUTH TWICE DAILY WITH MEALS 180 tablet 1  . naproxen (NAPROSYN) 500 MG tablet Take 1 tablet (500 mg total) by mouth 2 (two) times daily with a meal. (Patient not taking: Reported on 10/23/2014) 30 tablet 1  . NOVOLOG FLEXPEN 100 UNIT/ML FlexPen INJECT 4-6 UNITS INTO THE SKIN THREE TIMES DAILY BEFORE A MEAL( 4 UNITS IN THE AM AND 6 UNITS EVERY EVENING) 15 mL 0  . ondansetron (ZOFRAN) 4 MG tablet Take 1 tablet (4 mg total) by mouth every 8 (eight) hours as needed for nausea or vomiting. (Patient not taking: Reported on 10/23/2014) 30 tablet 0  . ONETOUCH DELICA LANCETS FINE MISC Check blood sugar as instructed up to 3 times a day 100 each 12  . pantoprazole (PROTONIX) 20 MG tablet Take 1  tablet (20 mg total) by mouth daily. 90 tablet 3  . pravastatin (PRAVACHOL) 20 MG tablet Take 1 tablet (20 mg total) by mouth daily. 30 tablet 3  . triamcinolone cream (KENALOG) 0.1 % Apply 1 application topically 2 (two) times daily. (Patient taking differently: Apply 1 application topically 2 (two) times daily as needed (Skin condition flare up). ) 30 g 0   No current facility-administered medications for this visit.   Family History  Problem Relation Age of Onset  . Hypertension Sister   . Hypertension Sister   . Hypertension Sister    History   Social History  . Marital Status: Single    Spouse Name: N/A  . Number of Children: 2  . Years of Education: 12th grade   Occupational History  . 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    Social History Main Topics  . Smoking status: Former Smoker -- 0.30 packs/day for 7 years    Types: Cigarettes    Quit date: 06/27/2006  . Smokeless tobacco: Never Used  . Alcohol Use: No  . Drug Use: No  . Sexual Activity: Not Currently   Other Topics Concern  . None   Social History Narrative   Lives with her kids, 69yo daughter and 97 yo adopted son.   Review of Systems: Review of Systems  Constitutional: Negative for fever, chills, malaise/fatigue and diaphoresis.  HENT: Negative for congestion and sore throat.   Respiratory: Negative for cough, shortness of breath and wheezing.   Cardiovascular: Negative for chest pain, palpitations and leg swelling.  Gastrointestinal: Negative for heartburn, nausea, vomiting, abdominal pain, diarrhea, constipation and blood in stool.  Genitourinary: Negative for dysuria, urgency, frequency and hematuria.  Musculoskeletal: Positive for back pain.  Neurological: Negative for dizziness, tingling and headaches.   Objective:  Physical Exam: Filed Vitals:   01/05/15 0851  BP: 128/60  Pulse: 90  Temp: 98.5 F (36.9 C)  TempSrc: Oral    Height: 5' 6" (1.676 m)  Weight: 261 lb 6.4 oz (118.57 kg)  SpO2: 100%   Physical Exam  Constitutional: She is oriented to person, place, and time. She appears well-developed and well-nourished.  HENT:  Head: Normocephalic and atraumatic.  Right Ear: External ear normal.  Left Ear: External ear normal.  Mouth/Throat: Oropharynx is clear and moist.  Eyes: Conjunctivae are normal. Pupils are equal, round, and reactive to light.  Cardiovascular: Normal rate, regular rhythm and normal heart sounds.   Pulmonary/Chest: Effort normal and breath sounds normal. No respiratory distress. She has no wheezes.  Musculoskeletal:       Lumbar  back: She exhibits tenderness.  Neurological: She is alert and oriented to person, place, and time.  Skin: Skin is warm and dry.  Psychiatric: She has a normal mood and affect. Her behavior is normal.   Assessment & Plan:  Please see problem based charting for current assessment and plan.

## 2015-01-05 NOTE — Assessment & Plan Note (Signed)
Patient is currently taking Lantus 42 units at bedtime, metformin 1000 mg twice a day, and Novolog 4-6 units before meals. She checks her blood sugar daily in the AM and at night. Her Hgb A1C on 10/31/14 was 8.4.   CBG 106 today. She does report that she did not have anything to eat this morning, but did drink diet mountain dew. She did not bring her glucometer today. She states that she had an episode of low blood sugars around 70 last week in which she felt sweaty, dizzy, and confused that was improved with food and drink . Her daughter helps her at home with her insulin.  We will continue her medications and have her follow up with her PCP in 1 month.

## 2015-01-05 NOTE — Assessment & Plan Note (Signed)
Last lipid profile was collected on 10/01/2013. We will collect her annual lipid panel today and have her follow up with her PCP in one month.

## 2015-01-05 NOTE — Patient Instructions (Signed)
Thank you for your visit.  Your blood sugar and blood pressure are improved today. Continue taking your medications and keep up the good work!  Today we are testing your cholesterol and having a urine drug screen as required for your pain management contract.  I am refilling your prescriptions for Norco and Amitriptyline.  We will schedule a follow up appointment with Dr. Benjamine Mola on August 12 at 9:30 am.

## 2015-01-05 NOTE — Assessment & Plan Note (Addendum)
Patient is here today for 1 month follow up of blood pressure management. Her BP on 12/04/14 was 157/75 and on 10/31/14 it was 157/73. Her BP today is 128/60. She says she has tried to improve her diet and be more active around the house. She also is taking her medications as prescribed.  We will continue her current medications and she will follow up with her PCP in August.

## 2015-01-06 DIAGNOSIS — G609 Hereditary and idiopathic neuropathy, unspecified: Secondary | ICD-10-CM | POA: Diagnosis not present

## 2015-01-06 LAB — PRESCRIPTION ABUSE MONITORING 15P, URINE
AMPHETAMINE/METH: NEGATIVE ng/mL
BARBITURATE SCREEN, URINE: NEGATIVE ng/mL
Benzodiazepine Screen, Urine: NEGATIVE ng/mL
Buprenorphine, Urine: NEGATIVE ng/mL
CARISOPRODOL, URINE: NEGATIVE ng/mL
COCAINE METABOLITES: NEGATIVE ng/mL
Cannabinoid Scrn, Ur: NEGATIVE ng/mL
Creatinine, Urine: 152.32 mg/dL (ref >20.0)
FENTANYL URINE: NEGATIVE ng/mL
MEPERIDINE UR: NEGATIVE ng/mL
Methadone Screen, Urine: NEGATIVE ng/mL
OXYCODONE SCRN UR: NEGATIVE ng/mL
PROPOXYPHENE: NEGATIVE ng/mL
Tramadol Scrn, Ur: NEGATIVE ng/mL
Zolpidem, Urine: NEGATIVE ng/mL

## 2015-01-07 DIAGNOSIS — G609 Hereditary and idiopathic neuropathy, unspecified: Secondary | ICD-10-CM | POA: Diagnosis not present

## 2015-01-07 NOTE — Progress Notes (Signed)
Internal Medicine Clinic Attending  I saw and evaluated the patient.  I personally confirmed the key portions of the history and exam documented by Dr. Patel,Vishal and I reviewed pertinent patient test results.  The assessment, diagnosis, and plan were formulated together and I agree with the documentation in the resident's note.  

## 2015-01-08 DIAGNOSIS — G609 Hereditary and idiopathic neuropathy, unspecified: Secondary | ICD-10-CM | POA: Diagnosis not present

## 2015-01-08 LAB — OPIATES/OPIOIDS (LC/MS-MS)
Codeine Urine: NEGATIVE ng/mL (ref <50)
HYDROMORPHONE: 95 ng/mL — AB (ref <50)
Hydrocodone: 1194 ng/mL — ABNORMAL HIGH (ref <50)
MORPHINE: NEGATIVE ng/mL (ref <50)
NORHYDROCODONE, UR: 2266 ng/mL — AB (ref <50)
NOROXYCODONE, UR: NEGATIVE ng/mL (ref <50)
Oxycodone, ur: NEGATIVE ng/mL (ref <50)
Oxymorphone: NEGATIVE ng/mL (ref <50)

## 2015-01-09 DIAGNOSIS — G609 Hereditary and idiopathic neuropathy, unspecified: Secondary | ICD-10-CM | POA: Diagnosis not present

## 2015-01-10 DIAGNOSIS — G609 Hereditary and idiopathic neuropathy, unspecified: Secondary | ICD-10-CM | POA: Diagnosis not present

## 2015-01-11 DIAGNOSIS — G609 Hereditary and idiopathic neuropathy, unspecified: Secondary | ICD-10-CM | POA: Diagnosis not present

## 2015-01-12 DIAGNOSIS — G609 Hereditary and idiopathic neuropathy, unspecified: Secondary | ICD-10-CM | POA: Diagnosis not present

## 2015-01-13 DIAGNOSIS — G609 Hereditary and idiopathic neuropathy, unspecified: Secondary | ICD-10-CM | POA: Diagnosis not present

## 2015-01-14 DIAGNOSIS — G609 Hereditary and idiopathic neuropathy, unspecified: Secondary | ICD-10-CM | POA: Diagnosis not present

## 2015-01-15 DIAGNOSIS — G609 Hereditary and idiopathic neuropathy, unspecified: Secondary | ICD-10-CM | POA: Diagnosis not present

## 2015-01-16 DIAGNOSIS — G609 Hereditary and idiopathic neuropathy, unspecified: Secondary | ICD-10-CM | POA: Diagnosis not present

## 2015-01-17 DIAGNOSIS — G609 Hereditary and idiopathic neuropathy, unspecified: Secondary | ICD-10-CM | POA: Diagnosis not present

## 2015-01-18 ENCOUNTER — Other Ambulatory Visit: Payer: Self-pay | Admitting: Internal Medicine

## 2015-01-18 DIAGNOSIS — G609 Hereditary and idiopathic neuropathy, unspecified: Secondary | ICD-10-CM | POA: Diagnosis not present

## 2015-01-19 DIAGNOSIS — G609 Hereditary and idiopathic neuropathy, unspecified: Secondary | ICD-10-CM | POA: Diagnosis not present

## 2015-01-20 DIAGNOSIS — G609 Hereditary and idiopathic neuropathy, unspecified: Secondary | ICD-10-CM | POA: Diagnosis not present

## 2015-01-21 DIAGNOSIS — G609 Hereditary and idiopathic neuropathy, unspecified: Secondary | ICD-10-CM | POA: Diagnosis not present

## 2015-01-22 DIAGNOSIS — G609 Hereditary and idiopathic neuropathy, unspecified: Secondary | ICD-10-CM | POA: Diagnosis not present

## 2015-01-23 DIAGNOSIS — G609 Hereditary and idiopathic neuropathy, unspecified: Secondary | ICD-10-CM | POA: Diagnosis not present

## 2015-01-24 DIAGNOSIS — G609 Hereditary and idiopathic neuropathy, unspecified: Secondary | ICD-10-CM | POA: Diagnosis not present

## 2015-01-25 ENCOUNTER — Other Ambulatory Visit: Payer: Self-pay | Admitting: Internal Medicine

## 2015-01-25 DIAGNOSIS — G609 Hereditary and idiopathic neuropathy, unspecified: Secondary | ICD-10-CM | POA: Diagnosis not present

## 2015-01-26 DIAGNOSIS — G609 Hereditary and idiopathic neuropathy, unspecified: Secondary | ICD-10-CM | POA: Diagnosis not present

## 2015-01-27 DIAGNOSIS — G609 Hereditary and idiopathic neuropathy, unspecified: Secondary | ICD-10-CM | POA: Diagnosis not present

## 2015-01-28 DIAGNOSIS — G609 Hereditary and idiopathic neuropathy, unspecified: Secondary | ICD-10-CM | POA: Diagnosis not present

## 2015-01-29 DIAGNOSIS — G609 Hereditary and idiopathic neuropathy, unspecified: Secondary | ICD-10-CM | POA: Diagnosis not present

## 2015-01-30 ENCOUNTER — Other Ambulatory Visit: Payer: Self-pay | Admitting: *Deleted

## 2015-01-30 ENCOUNTER — Other Ambulatory Visit: Payer: Self-pay | Admitting: Internal Medicine

## 2015-01-30 DIAGNOSIS — G609 Hereditary and idiopathic neuropathy, unspecified: Secondary | ICD-10-CM | POA: Diagnosis not present

## 2015-01-30 MED ORDER — INSULIN GLARGINE 100 UNIT/ML SOLOSTAR PEN: 44.0000 [IU] | PEN_INJECTOR | Freq: Every day | SUBCUTANEOUS | Status: DC

## 2015-01-30 NOTE — Telephone Encounter (Signed)
rec'd request for refill on novolog flexpen, not in medlist but is noted at last visit, please advise, if she is to be on it please send refill to [facility] and add to [facility]

## 2015-01-31 DIAGNOSIS — G609 Hereditary and idiopathic neuropathy, unspecified: Secondary | ICD-10-CM | POA: Diagnosis not present

## 2015-02-01 DIAGNOSIS — G609 Hereditary and idiopathic neuropathy, unspecified: Secondary | ICD-10-CM | POA: Diagnosis not present

## 2015-02-02 ENCOUNTER — Telehealth: Payer: Self-pay | Admitting: *Deleted

## 2015-02-02 DIAGNOSIS — G609 Hereditary and idiopathic neuropathy, unspecified: Secondary | ICD-10-CM | POA: Diagnosis not present

## 2015-02-02 NOTE — Telephone Encounter (Signed)
Please review note with last refill on 8/5 and advise on novolog flexpen

## 2015-02-03 DIAGNOSIS — G609 Hereditary and idiopathic neuropathy, unspecified: Secondary | ICD-10-CM | POA: Diagnosis not present

## 2015-02-04 DIAGNOSIS — G609 Hereditary and idiopathic neuropathy, unspecified: Secondary | ICD-10-CM | POA: Diagnosis not present

## 2015-02-04 NOTE — Telephone Encounter (Signed)
Called Ms. Ellenbecker regarding her Novolog insulin refill. Confirmed she had contacted pharmacy and was informed of prescription being cancelled. She is currently feeling well but reports higher than usual home CBG values due to missing treatments since about 8/6. Details of her home medication were confirmed and new prescription was sent to pharmacy for refill.

## 2015-02-05 DIAGNOSIS — G609 Hereditary and idiopathic neuropathy, unspecified: Secondary | ICD-10-CM | POA: Diagnosis not present

## 2015-02-06 ENCOUNTER — Ambulatory Visit: Payer: Commercial Managed Care - HMO | Admitting: Internal Medicine

## 2015-02-06 ENCOUNTER — Encounter: Payer: Self-pay | Admitting: Internal Medicine

## 2015-02-06 DIAGNOSIS — G609 Hereditary and idiopathic neuropathy, unspecified: Secondary | ICD-10-CM | POA: Diagnosis not present

## 2015-02-07 DIAGNOSIS — G609 Hereditary and idiopathic neuropathy, unspecified: Secondary | ICD-10-CM | POA: Diagnosis not present

## 2015-02-08 DIAGNOSIS — G609 Hereditary and idiopathic neuropathy, unspecified: Secondary | ICD-10-CM | POA: Diagnosis not present

## 2015-02-09 DIAGNOSIS — G609 Hereditary and idiopathic neuropathy, unspecified: Secondary | ICD-10-CM | POA: Diagnosis not present

## 2015-02-10 DIAGNOSIS — G609 Hereditary and idiopathic neuropathy, unspecified: Secondary | ICD-10-CM | POA: Diagnosis not present

## 2015-02-11 DIAGNOSIS — G609 Hereditary and idiopathic neuropathy, unspecified: Secondary | ICD-10-CM | POA: Diagnosis not present

## 2015-02-12 DIAGNOSIS — G609 Hereditary and idiopathic neuropathy, unspecified: Secondary | ICD-10-CM | POA: Diagnosis not present

## 2015-02-13 DIAGNOSIS — G609 Hereditary and idiopathic neuropathy, unspecified: Secondary | ICD-10-CM | POA: Diagnosis not present

## 2015-02-14 DIAGNOSIS — G609 Hereditary and idiopathic neuropathy, unspecified: Secondary | ICD-10-CM | POA: Diagnosis not present

## 2015-02-15 DIAGNOSIS — G609 Hereditary and idiopathic neuropathy, unspecified: Secondary | ICD-10-CM | POA: Diagnosis not present

## 2015-02-16 DIAGNOSIS — G609 Hereditary and idiopathic neuropathy, unspecified: Secondary | ICD-10-CM | POA: Diagnosis not present

## 2015-02-17 DIAGNOSIS — G609 Hereditary and idiopathic neuropathy, unspecified: Secondary | ICD-10-CM | POA: Diagnosis not present

## 2015-02-18 DIAGNOSIS — G609 Hereditary and idiopathic neuropathy, unspecified: Secondary | ICD-10-CM | POA: Diagnosis not present

## 2015-02-19 ENCOUNTER — Ambulatory Visit (INDEPENDENT_AMBULATORY_CARE_PROVIDER_SITE_OTHER): Payer: Commercial Managed Care - HMO | Admitting: Internal Medicine

## 2015-02-19 ENCOUNTER — Encounter: Payer: Self-pay | Admitting: Internal Medicine

## 2015-02-19 VITALS — BP 124/69 | HR 100 | Temp 98.3°F | Ht 66.0 in | Wt 257.9 lb

## 2015-02-19 DIAGNOSIS — Z79899 Other long term (current) drug therapy: Secondary | ICD-10-CM

## 2015-02-19 DIAGNOSIS — G609 Hereditary and idiopathic neuropathy, unspecified: Secondary | ICD-10-CM | POA: Diagnosis not present

## 2015-02-19 DIAGNOSIS — Z794 Long term (current) use of insulin: Secondary | ICD-10-CM | POA: Diagnosis not present

## 2015-02-19 DIAGNOSIS — E1140 Type 2 diabetes mellitus with diabetic neuropathy, unspecified: Secondary | ICD-10-CM | POA: Diagnosis not present

## 2015-02-19 DIAGNOSIS — I10 Essential (primary) hypertension: Secondary | ICD-10-CM | POA: Diagnosis not present

## 2015-02-19 DIAGNOSIS — IMO0002 Reserved for concepts with insufficient information to code with codable children: Secondary | ICD-10-CM

## 2015-02-19 DIAGNOSIS — M25561 Pain in right knee: Secondary | ICD-10-CM

## 2015-02-19 DIAGNOSIS — E1165 Type 2 diabetes mellitus with hyperglycemia: Secondary | ICD-10-CM

## 2015-02-19 DIAGNOSIS — M5442 Lumbago with sciatica, left side: Secondary | ICD-10-CM

## 2015-02-19 DIAGNOSIS — K219 Gastro-esophageal reflux disease without esophagitis: Secondary | ICD-10-CM

## 2015-02-19 DIAGNOSIS — M5441 Lumbago with sciatica, right side: Secondary | ICD-10-CM

## 2015-02-19 DIAGNOSIS — L309 Dermatitis, unspecified: Secondary | ICD-10-CM

## 2015-02-19 DIAGNOSIS — M25562 Pain in left knee: Secondary | ICD-10-CM

## 2015-02-19 DIAGNOSIS — G4700 Insomnia, unspecified: Secondary | ICD-10-CM

## 2015-02-19 LAB — GLUCOSE, CAPILLARY: Glucose-Capillary: 108 mg/dL — ABNORMAL HIGH (ref 65–99)

## 2015-02-19 LAB — POCT GLYCOSYLATED HEMOGLOBIN (HGB A1C): HEMOGLOBIN A1C: 8.1

## 2015-02-19 MED ORDER — PANTOPRAZOLE SODIUM 40 MG PO TBEC: 40.0000 mg | DELAYED_RELEASE_TABLET | Freq: Every day | ORAL | Status: DC

## 2015-02-19 MED ORDER — TRIAMCINOLONE ACETONIDE 0.1 % EX CREA: 1.0000 "application " | TOPICAL_CREAM | Freq: Two times a day (BID) | CUTANEOUS | Status: DC

## 2015-02-19 MED ORDER — AMITRIPTYLINE HCL 25 MG PO TABS: 25.0000 mg | ORAL_TABLET | Freq: Every day | ORAL | Status: DC

## 2015-02-19 MED ORDER — HYDROCODONE-ACETAMINOPHEN 5-325 MG PO TABS: 1.0000 | ORAL_TABLET | Freq: Four times a day (QID) | ORAL | Status: DC | PRN

## 2015-02-19 NOTE — Assessment & Plan Note (Signed)
Well controlled Filed Vitals:   02/19/15 0828  BP: 124/69  Pulse: 100  Temp: 98.3 F (36.8 C)   Continue same regimen: coreg 6.25mg  bid + lasix 20mg  daily + lisinopril 10mg  daily.

## 2015-02-19 NOTE — Assessment & Plan Note (Signed)
Having increased GERD symptoms, currently on protonix 20mg  daily. Having trouble sleeping because of GERD problems.  Will increase to protonix 40mg  daily. Asked to take in empty stomach 30 mins before meal.

## 2015-02-19 NOTE — Assessment & Plan Note (Signed)
Has chronic LBP with sciatica. Also has knee pain R>L, this is chronic per patient.  Takes norco. Almost done with her previous refill which has been more than 30 days.  Refilled #90 tabs x1. Asked to make appt to see her PCP.

## 2015-02-19 NOTE — Assessment & Plan Note (Signed)
Using amitryptilne to help with insomnia. Refilled this.  She asked me to go up on the dose but I told her that we will try on improving her GERD symptoms first and see if she improves.

## 2015-02-19 NOTE — Assessment & Plan Note (Signed)
Having itching, skin is dry. Not using any moisturizer on a regular basis. Has kenalog cream which she uses as needed.  Asked her to buy OTC cream to keep skin moisturize during day time. Asked to use vaseline at night time.  Asked to use kenalog cream only for flare ups for a week then go back to maintenance with moisturizing cream+vaseline

## 2015-02-19 NOTE — Patient Instructions (Addendum)
  Continue to work on getting more exercise at least 5 times a week.  Continue to follow healthy low carb, low salt diet.  Follow up in 1 month with your Primary care Dr. Benjamine [name].

## 2015-02-19 NOTE — Progress Notes (Signed)
   Subjective:    Patient ID: [name], female    DOB: Jul 24, 1960, 54 y.o.   MRN: [id number]  HPI  54 yo female with hx of HTN, DM II, CAD, CHF, HLD, chronic back pain here for follow up on DM II, HTN, and also for her medication refills.   See problem based documentation.    Review of Systems  Constitutional: Negative for fever, chills and fatigue.  HENT: Negative for congestion and sore throat.   Respiratory: Negative for cough, choking, chest tightness, shortness of breath and wheezing.   Cardiovascular: Negative for chest pain, palpitations and leg swelling.  Gastrointestinal: Negative for nausea, vomiting, abdominal pain, diarrhea and abdominal distention.       Heartburn  Endocrine: Negative.   Genitourinary: Negative for dysuria, flank pain and vaginal pain.  Musculoskeletal: Positive for back pain and arthralgias. Negative for joint swelling, gait problem, neck pain and neck stiffness.  Skin:       Dry and itchy  Allergic/Immunologic: Negative.   Neurological: Negative for dizziness, weakness and numbness.  Hematological: Negative for adenopathy. Does not bruise/bleed easily.  Psychiatric/Behavioral: Negative.        Objective:   Physical Exam  Constitutional: She is oriented to person, place, and time. She appears well-developed and well-nourished. No distress.  HENT:  Head: Normocephalic and atraumatic.  Mouth/Throat: No oropharyngeal exudate.  Eyes: Pupils are equal, round, and reactive to light.  Neck: Normal range of motion.  Cardiovascular: Normal rate and regular rhythm.  Exam reveals no gallop and no friction rub.   No murmur heard. Pulmonary/Chest: Effort normal and breath sounds normal. No respiratory distress. She has no wheezes.  Abdominal: Soft. Bowel sounds are normal. She exhibits no distension.  Musculoskeletal: Normal range of motion. She exhibits no edema.  Mild tenderness on right knee and lower back to palpation.   Neurological: She is  alert and oriented to person, place, and time. No cranial nerve deficit.  Skin: Skin is warm and dry. She is not diaphoretic.  Dry skin. No erythema or rash.      Filed Vitals:   02/19/15 0828  BP: 124/69  Pulse: 100  Temp: 98.3 F (36.8 C)        Assessment & Plan:  See problem based a&p.

## 2015-02-19 NOTE — Assessment & Plan Note (Addendum)
On lantus 44, metformin 1000mg  bid, and ssi novolog 4-6 units before meals.  Last hgba1c was 8.4 three months ago.  Repeat hgba1c: 8.1  Didn't bring meter. States BS usually 200's, does have lows in 70's.  Will not change any regimen as having low's and also didn't bring meter.   Working on improving lifestyle.

## 2015-02-20 DIAGNOSIS — G609 Hereditary and idiopathic neuropathy, unspecified: Secondary | ICD-10-CM | POA: Diagnosis not present

## 2015-02-20 NOTE — Progress Notes (Signed)
Medicine attending: Medical history, presenting problems, physical findings, and medications, reviewed with Dr Dellia Nims and I concur with his evaluation and management plan.

## 2015-02-21 DIAGNOSIS — G609 Hereditary and idiopathic neuropathy, unspecified: Secondary | ICD-10-CM | POA: Diagnosis not present

## 2015-02-22 DIAGNOSIS — G609 Hereditary and idiopathic neuropathy, unspecified: Secondary | ICD-10-CM | POA: Diagnosis not present

## 2015-02-23 ENCOUNTER — Other Ambulatory Visit: Payer: Self-pay | Admitting: Internal Medicine

## 2015-02-23 DIAGNOSIS — G609 Hereditary and idiopathic neuropathy, unspecified: Secondary | ICD-10-CM | POA: Diagnosis not present

## 2015-02-24 DIAGNOSIS — G609 Hereditary and idiopathic neuropathy, unspecified: Secondary | ICD-10-CM | POA: Diagnosis not present

## 2015-02-25 DIAGNOSIS — G609 Hereditary and idiopathic neuropathy, unspecified: Secondary | ICD-10-CM | POA: Diagnosis not present

## 2015-02-26 DIAGNOSIS — G609 Hereditary and idiopathic neuropathy, unspecified: Secondary | ICD-10-CM | POA: Diagnosis not present

## 2015-02-27 DIAGNOSIS — G609 Hereditary and idiopathic neuropathy, unspecified: Secondary | ICD-10-CM | POA: Diagnosis not present

## 2015-02-28 DIAGNOSIS — G609 Hereditary and idiopathic neuropathy, unspecified: Secondary | ICD-10-CM | POA: Diagnosis not present

## 2015-03-01 DIAGNOSIS — G609 Hereditary and idiopathic neuropathy, unspecified: Secondary | ICD-10-CM | POA: Diagnosis not present

## 2015-03-02 DIAGNOSIS — G609 Hereditary and idiopathic neuropathy, unspecified: Secondary | ICD-10-CM | POA: Diagnosis not present

## 2015-03-03 DIAGNOSIS — G609 Hereditary and idiopathic neuropathy, unspecified: Secondary | ICD-10-CM | POA: Diagnosis not present

## 2015-03-04 DIAGNOSIS — G609 Hereditary and idiopathic neuropathy, unspecified: Secondary | ICD-10-CM | POA: Diagnosis not present

## 2015-03-05 DIAGNOSIS — G609 Hereditary and idiopathic neuropathy, unspecified: Secondary | ICD-10-CM | POA: Diagnosis not present

## 2015-03-06 DIAGNOSIS — G609 Hereditary and idiopathic neuropathy, unspecified: Secondary | ICD-10-CM | POA: Diagnosis not present

## 2015-03-07 DIAGNOSIS — G609 Hereditary and idiopathic neuropathy, unspecified: Secondary | ICD-10-CM | POA: Diagnosis not present

## 2015-03-08 DIAGNOSIS — G609 Hereditary and idiopathic neuropathy, unspecified: Secondary | ICD-10-CM | POA: Diagnosis not present

## 2015-03-09 DIAGNOSIS — G609 Hereditary and idiopathic neuropathy, unspecified: Secondary | ICD-10-CM | POA: Diagnosis not present

## 2015-03-10 DIAGNOSIS — G609 Hereditary and idiopathic neuropathy, unspecified: Secondary | ICD-10-CM | POA: Diagnosis not present

## 2015-03-11 DIAGNOSIS — G609 Hereditary and idiopathic neuropathy, unspecified: Secondary | ICD-10-CM | POA: Diagnosis not present

## 2015-03-12 DIAGNOSIS — G609 Hereditary and idiopathic neuropathy, unspecified: Secondary | ICD-10-CM | POA: Diagnosis not present

## 2015-03-13 DIAGNOSIS — G609 Hereditary and idiopathic neuropathy, unspecified: Secondary | ICD-10-CM | POA: Diagnosis not present

## 2015-03-14 DIAGNOSIS — G609 Hereditary and idiopathic neuropathy, unspecified: Secondary | ICD-10-CM | POA: Diagnosis not present

## 2015-03-15 DIAGNOSIS — G609 Hereditary and idiopathic neuropathy, unspecified: Secondary | ICD-10-CM | POA: Diagnosis not present

## 2015-03-16 ENCOUNTER — Other Ambulatory Visit: Payer: Self-pay | Admitting: Internal Medicine

## 2015-03-16 DIAGNOSIS — G609 Hereditary and idiopathic neuropathy, unspecified: Secondary | ICD-10-CM | POA: Diagnosis not present

## 2015-03-16 NOTE — Telephone Encounter (Signed)
Filled today and electronically sent to pharm

## 2015-03-16 NOTE — Telephone Encounter (Signed)
Pt called requesting permethrin to be filled @ [facility] on [address].

## 2015-03-17 DIAGNOSIS — G609 Hereditary and idiopathic neuropathy, unspecified: Secondary | ICD-10-CM | POA: Diagnosis not present

## 2015-03-18 DIAGNOSIS — G609 Hereditary and idiopathic neuropathy, unspecified: Secondary | ICD-10-CM | POA: Diagnosis not present

## 2015-03-19 DIAGNOSIS — G609 Hereditary and idiopathic neuropathy, unspecified: Secondary | ICD-10-CM | POA: Diagnosis not present

## 2015-03-20 DIAGNOSIS — G609 Hereditary and idiopathic neuropathy, unspecified: Secondary | ICD-10-CM | POA: Diagnosis not present

## 2015-03-21 DIAGNOSIS — G609 Hereditary and idiopathic neuropathy, unspecified: Secondary | ICD-10-CM | POA: Diagnosis not present

## 2015-03-22 DIAGNOSIS — G609 Hereditary and idiopathic neuropathy, unspecified: Secondary | ICD-10-CM | POA: Diagnosis not present

## 2015-03-23 DIAGNOSIS — G609 Hereditary and idiopathic neuropathy, unspecified: Secondary | ICD-10-CM | POA: Diagnosis not present

## 2015-03-24 DIAGNOSIS — G609 Hereditary and idiopathic neuropathy, unspecified: Secondary | ICD-10-CM | POA: Diagnosis not present

## 2015-03-25 DIAGNOSIS — G609 Hereditary and idiopathic neuropathy, unspecified: Secondary | ICD-10-CM | POA: Diagnosis not present

## 2015-03-26 DIAGNOSIS — G609 Hereditary and idiopathic neuropathy, unspecified: Secondary | ICD-10-CM | POA: Diagnosis not present

## 2015-03-27 DIAGNOSIS — G609 Hereditary and idiopathic neuropathy, unspecified: Secondary | ICD-10-CM | POA: Diagnosis not present

## 2015-03-28 DIAGNOSIS — G609 Hereditary and idiopathic neuropathy, unspecified: Secondary | ICD-10-CM | POA: Diagnosis not present

## 2015-03-29 DIAGNOSIS — G609 Hereditary and idiopathic neuropathy, unspecified: Secondary | ICD-10-CM | POA: Diagnosis not present

## 2015-03-30 DIAGNOSIS — G609 Hereditary and idiopathic neuropathy, unspecified: Secondary | ICD-10-CM | POA: Diagnosis not present

## 2015-03-31 ENCOUNTER — Ambulatory Visit (HOSPITAL_COMMUNITY)
Admission: RE | Admit: 2015-03-31 | Discharge: 2015-03-31 | Disposition: A | Discharge status: Home / Self Care | Payer: Commercial Managed Care - HMO | Source: Ambulatory Visit | Attending: Internal Medicine | Admitting: Internal Medicine

## 2015-03-31 ENCOUNTER — Ambulatory Visit (INDEPENDENT_AMBULATORY_CARE_PROVIDER_SITE_OTHER): Payer: Commercial Managed Care - HMO | Admitting: Internal Medicine

## 2015-03-31 ENCOUNTER — Encounter: Payer: Self-pay | Admitting: Internal Medicine

## 2015-03-31 VITALS — BP 140/63 | HR 96 | Temp 98.4°F | Ht 66.0 in | Wt 257.2 lb

## 2015-03-31 DIAGNOSIS — G609 Hereditary and idiopathic neuropathy, unspecified: Secondary | ICD-10-CM | POA: Diagnosis not present

## 2015-03-31 DIAGNOSIS — R0602 Shortness of breath: Secondary | ICD-10-CM | POA: Insufficient documentation

## 2015-03-31 DIAGNOSIS — R05 Cough: Secondary | ICD-10-CM | POA: Diagnosis not present

## 2015-03-31 DIAGNOSIS — E1165 Type 2 diabetes mellitus with hyperglycemia: Secondary | ICD-10-CM

## 2015-03-31 DIAGNOSIS — IMO0002 Reserved for concepts with insufficient information to code with codable children: Secondary | ICD-10-CM

## 2015-03-31 DIAGNOSIS — J069 Acute upper respiratory infection, unspecified: Secondary | ICD-10-CM | POA: Insufficient documentation

## 2015-03-31 DIAGNOSIS — E1140 Type 2 diabetes mellitus with diabetic neuropathy, unspecified: Secondary | ICD-10-CM

## 2015-03-31 LAB — GLUCOSE, CAPILLARY: Glucose-Capillary: 112 mg/dL — ABNORMAL HIGH (ref 65–99)

## 2015-03-31 MED ORDER — GUAIFENESIN-CODEINE 100-10 MG/5ML PO SOLN: 10.0000 mL | Freq: Four times a day (QID) | ORAL | Status: DC | PRN

## 2015-03-31 MED ORDER — ONDANSETRON HCL 4 MG PO TABS: 4.0000 mg | ORAL_TABLET | Freq: Three times a day (TID) | ORAL | Status: DC | PRN

## 2015-03-31 NOTE — Patient Instructions (Signed)
1. Please make a follow up appointment for 1 week.   2. Please take all medications as previously prescribed with the following changes:  Start taking Robitussin AC for cough. Use tylenol prn for body aches and chills. You can take Zofran 4 mg every 8 hours for nausea.   3. If you have worsening of your symptoms or new symptoms arise, please call the clinic ([phone]), or go to the ER immediately if symptoms are severe.  You have done a great job in taking all your medications. Please continue to do this.

## 2015-04-01 ENCOUNTER — Other Ambulatory Visit: Payer: Self-pay | Admitting: Internal Medicine

## 2015-04-01 DIAGNOSIS — M5442 Lumbago with sciatica, left side: Principal | ICD-10-CM

## 2015-04-01 DIAGNOSIS — G609 Hereditary and idiopathic neuropathy, unspecified: Secondary | ICD-10-CM | POA: Diagnosis not present

## 2015-04-01 DIAGNOSIS — M5441 Lumbago with sciatica, right side: Secondary | ICD-10-CM

## 2015-04-01 NOTE — Telephone Encounter (Signed)
Pt called requesting hydrocodone to be filled.  °

## 2015-04-01 NOTE — Telephone Encounter (Signed)
Last refill 8/25 Last Office Visit 10/4 Last UDS 7//16

## 2015-04-01 NOTE — Progress Notes (Signed)
Subjective:   Patient ID: [name] female   DOB: [birth year] 54 y.o.   MRN: [id number]  HPI: [name] is a 54 y.o. female w/ multiple co-morbidities as listed below, presents to the clinic today for an acute visit for cough. Patient states she has had upper respiratory symptoms for about 10 days including cough, congestion, headache, chills, nausea, and diarrhea. She also admits to her daughter having similar symptoms recently. She also notes some mild SOB w/ ambulation. Her cough is mostly dry in nature but does state there is some scant yellow mucus from time to time.   Today, her SpO2 is 99%, patient is comfortable, in no acute distress. Dry cough noted. No fever.   Past Medical History  Diagnosis Date  . Hypertension   . Hyperlipidemia   . GERD (gastroesophageal reflux disease)   . Adhesive capsulitis of shoulder     bilateral, Steroid injection Dr. Truman Hayward 1/12 bilaterally  . Peripheral neuropathy ([place])   . Adhesive capsulitis of right shoulder     Steroid injection Dr. Truman Hayward 1/12  . Adhesive capsulitis of left shoulder     Steroid injection Dr. Truman Hayward 1/12  . Obesity   . Diabetic retinopathy   . Glaucoma   . Diabetes mellitus     Type II, insulin dependent  . CAD (coronary artery disease)     nonobstructive. Last cardiac cath (2008) showing left circumflex with mid 50% stenosis and distal luminal irregularities. Also with RCA with mid to distal 30-40% stenosis. // Previously evaluated by [facility], never followed up outpatient.  . CHF (congestive heart failure) ([place])   . CAP (community acquired pneumonia) 06/15/2012    05/2012 CXR: Mild opacification of the posterior lung base on the lateral film  as cannot exclude infection/atelectasis.     Current Outpatient Prescriptions  Medication Sig Dispense Refill  . amitriptyline (ELAVIL) 25 MG tablet Take 1 tablet (25 mg total) by mouth at bedtime. 90 tablet 0  . aspirin EC 325 MG EC tablet Take 1 tablet (325 mg  total) by mouth daily. 30 tablet 0  . carvedilol (COREG) 6.25 MG tablet Take 1 tablet (6.25 mg total) by mouth 2 (two) times daily. 60 tablet 11  . cyclobenzaprine (FLEXERIL) 5 MG tablet Take 1 tablet (5 mg total) by mouth at bedtime. 270 tablet 1  . dorzolamide-timolol (COSOPT) 22.3-6.8 MG/ML ophthalmic solution Place 1 drop into the right eye 2 (two) times daily.    . ferrous sulfate 325 (65 FE) MG tablet Take 1 tablet (325 mg total) by mouth daily with breakfast. 30 tablet 11  . furosemide (LASIX) 40 MG tablet Take 0.5 tablets (20 mg total) by mouth daily. 30 tablet 3  . gabapentin (NEURONTIN) 300 MG capsule TAKE 1 CAPSULE THREE TIMES DAILY 270 capsule 0  . glucose blood (ONETOUCH VERIO) test strip Check blood sugar as instructed up to 3 times a day 100 each 12  . guaiFENesin-codeine 100-10 MG/5ML syrup Take 10 mLs by mouth every 6 (six) hours as needed for cough. 120 mL 0  . HYDROcodone-acetaminophen (NORCO/VICODIN) 5-325 MG per tablet Take 1 tablet by mouth every 6 (six) hours as needed for moderate pain (pain). 90 tablet 0  . Insulin Glargine (LANTUS SOLOSTAR) 100 UNIT/ML Solostar Pen Inject 44 Units into the skin at bedtime. 30 mL 6  . Insulin Pen Needle (B-D UF III MINI PEN NEEDLES) 31G X 5 MM MISC Use to inject insulin 4-5 times daily 200 each 5  .  lisinopril (PRINIVIL,ZESTRIL) 20 MG tablet Take 0.5 tablets (10 mg total) by mouth daily. 90 tablet 3  . meclizine (ANTIVERT) 50 MG tablet Take 1 tablet (50 mg total) by mouth 2 (two) times daily. 30 tablet 0  . metFORMIN (GLUCOPHAGE) 1000 MG tablet TAKE 1 TABLET BY MOUTH TWICE DAILY WITH MEALS 180 tablet 0  . naproxen (NAPROSYN) 500 MG tablet Take 1 tablet (500 mg total) by mouth 2 (two) times daily with a meal. (Patient not taking: Reported on 10/23/2014) 30 tablet 1  . NOVOLOG FLEXPEN 100 UNIT/ML FlexPen INJECT 4-6 UNITS INTO THE SKIN THREE TIMES DAILY BEFORE A MEAL.*(4 UNITS IN THE AM AND 6 UNITS IN THE PM) 15 mL 0  . ondansetron (ZOFRAN) 4 MG  tablet Take 1 tablet (4 mg total) by mouth every 8 (eight) hours as needed for nausea or vomiting. 30 tablet 0  . ONETOUCH DELICA LANCETS FINE MISC Check blood sugar as instructed up to 3 times a day 100 each 12  . pantoprazole (PROTONIX) 40 MG tablet Take 1 tablet (40 mg total) by mouth daily. 90 tablet 3  . permethrin (ELIMITE) 5 % cream APPLY TO ENTIRE BODY NECK DOWN, LEAVE ON 14 HOURS AND WASH OFF. REPEAT IN 1 WEEK AS NEEDED 60 g 0  . pravastatin (PRAVACHOL) 20 MG tablet Take 1 tablet (20 mg total) by mouth daily. 30 tablet 3  . triamcinolone cream (KENALOG) 0.1 % Apply 1 application topically 2 (two) times daily. 30 g 0   No current facility-administered medications for this visit.    Review of Systems: General: Positive for chills. Denies chills, diaphoresis, appetite change and fatigue.  Respiratory: Positive for DOE, cough. Denies wheezing.   Cardiovascular: Denies chest pain and palpitations.  Gastrointestinal: Positive for nausea and diarrhea. Denies vomiting, abdominal pain.  Genitourinary: Denies dysuria, increased frequency, and flank pain. Endocrine: Denies hot or cold intolerance, polyuria, and polydipsia. Musculoskeletal: Positive for myalgias. Denies back pain, joint swelling, arthralgias and gait problem.  Skin: Denies pallor, rash and wounds.  Neurological: Denies dizziness, seizures, syncope, weakness, lightheadedness, numbness and headaches.  Psychiatric/Behavioral: Denies mood changes, and sleep disturbances.  Objective:   Physical Exam: Filed Vitals:   03/31/15 1438  BP: 140/63  Pulse: 96  Temp: 98.4 F (36.9 C)  TempSrc: Oral  Height: 5\' 6"  (1.676 m)  Weight: 257 lb 3.2 oz (116.665 kg)  SpO2: 99%    General: AA female, alert, cooperative, NAD. Dry cough on exam.  HEENT: PERRL, EOMI. Moist mucus membranes Neck: Full range of motion without pain, supple, no lymphadenopathy or carotid bruits Lungs: Clear to ascultation bilaterally, normal work of  respiration, no wheezes, rales, rhonchi Heart: RRR, no murmurs, gallops, or rubs Abdomen: Soft, non-tender, non-distended, BS + Extremities: No cyanosis, clubbing, or edema Neurologic: Alert & oriented X3, cranial nerves II-XII intact, strength grossly intact, sensation intact to light touch   Assessment & Plan:   Please see problem based assessment and plan.

## 2015-04-01 NOTE — Assessment & Plan Note (Signed)
Patient has had symptoms of URI for 10 days. She notes dry cough, chills, congestion, headache, myalgias, diarrhea, and nausea. Also admits to having a sick contact. On exam, breath sounds unremarkable. No bronchial breath sounds in peripheral lung fields, no rhonchi, wheezes, or rales. CXR performed showed no significant infiltrate. Given constellation of symptoms and absence of infiltrate, suspect viral bronchitis.  -Robitussin AC prn for cough -Zofran prn for nausea -RTC in 1 week for follow up

## 2015-04-02 DIAGNOSIS — G609 Hereditary and idiopathic neuropathy, unspecified: Secondary | ICD-10-CM | POA: Diagnosis not present

## 2015-04-03 DIAGNOSIS — G609 Hereditary and idiopathic neuropathy, unspecified: Secondary | ICD-10-CM | POA: Diagnosis not present

## 2015-04-04 DIAGNOSIS — G609 Hereditary and idiopathic neuropathy, unspecified: Secondary | ICD-10-CM | POA: Diagnosis not present

## 2015-04-05 DIAGNOSIS — G609 Hereditary and idiopathic neuropathy, unspecified: Secondary | ICD-10-CM | POA: Diagnosis not present

## 2015-04-06 DIAGNOSIS — G609 Hereditary and idiopathic neuropathy, unspecified: Secondary | ICD-10-CM | POA: Diagnosis not present

## 2015-04-06 NOTE — Progress Notes (Signed)
Internal Medicine Clinic Attending  Case discussed with Dr. Jones soon after the resident saw the patient.  We reviewed the resident's history and exam and pertinent patient test results.  I agree with the assessment, diagnosis, and plan of care documented in the resident's note. 

## 2015-04-07 ENCOUNTER — Ambulatory Visit (INDEPENDENT_AMBULATORY_CARE_PROVIDER_SITE_OTHER): Payer: Commercial Managed Care - HMO | Admitting: Internal Medicine

## 2015-04-07 ENCOUNTER — Encounter: Payer: Self-pay | Admitting: Internal Medicine

## 2015-04-07 VITALS — BP 124/59 | HR 90 | Temp 98.2°F | Ht 66.0 in | Wt 258.1 lb

## 2015-04-07 DIAGNOSIS — E1140 Type 2 diabetes mellitus with diabetic neuropathy, unspecified: Secondary | ICD-10-CM

## 2015-04-07 DIAGNOSIS — I10 Essential (primary) hypertension: Secondary | ICD-10-CM | POA: Diagnosis not present

## 2015-04-07 DIAGNOSIS — IMO0002 Reserved for concepts with insufficient information to code with codable children: Secondary | ICD-10-CM

## 2015-04-07 DIAGNOSIS — G609 Hereditary and idiopathic neuropathy, unspecified: Secondary | ICD-10-CM | POA: Diagnosis not present

## 2015-04-07 DIAGNOSIS — Z7984 Long term (current) use of oral hypoglycemic drugs: Secondary | ICD-10-CM

## 2015-04-07 DIAGNOSIS — J069 Acute upper respiratory infection, unspecified: Secondary | ICD-10-CM

## 2015-04-07 DIAGNOSIS — Z794 Long term (current) use of insulin: Secondary | ICD-10-CM

## 2015-04-07 DIAGNOSIS — E1165 Type 2 diabetes mellitus with hyperglycemia: Secondary | ICD-10-CM | POA: Diagnosis not present

## 2015-04-07 DIAGNOSIS — G4700 Insomnia, unspecified: Secondary | ICD-10-CM

## 2015-04-07 DIAGNOSIS — M5442 Lumbago with sciatica, left side: Secondary | ICD-10-CM

## 2015-04-07 DIAGNOSIS — M5441 Lumbago with sciatica, right side: Secondary | ICD-10-CM

## 2015-04-07 MED ORDER — GUAIFENESIN-DM 100-10 MG/5ML PO SYRP: 5.0000 mL | ORAL_SOLUTION | ORAL | Status: DC | PRN

## 2015-04-07 MED ORDER — HYDROCODONE-ACETAMINOPHEN 5-325 MG PO TABS: 1.0000 | ORAL_TABLET | Freq: Four times a day (QID) | ORAL | Status: DC | PRN

## 2015-04-07 MED ORDER — LISINOPRIL 20 MG PO TABS: 10.0000 mg | ORAL_TABLET | Freq: Every day | ORAL | Status: DC

## 2015-04-07 MED ORDER — HYDROCODONE-ACETAMINOPHEN 5-325 MG PO TABS
1.0000 | ORAL_TABLET | Freq: Four times a day (QID) | ORAL | Status: DC | PRN
Start: 2015-05-08 — End: 2015-07-01

## 2015-04-07 MED ORDER — INSULIN ASPART 100 UNIT/ML FLEXPEN: PEN_INJECTOR | SUBCUTANEOUS | Status: DC

## 2015-04-07 MED ORDER — AMITRIPTYLINE HCL 25 MG PO TABS: 25.0000 mg | ORAL_TABLET | Freq: Every day | ORAL | Status: DC

## 2015-04-07 NOTE — Assessment & Plan Note (Signed)
She brought meter today. Morning BGs in the 70s with peak BGs no greater than 140. Much improved from last month.  - Continue Lantus 44, metformin 1000mg  bid, and ssi novologue 4-6 U before meals - Refill Novolog

## 2015-04-07 NOTE — Patient Instructions (Signed)
[name], it was a pleasure meeting you today. You can have a cough for up to 6-8 weeks after a cold. To manage this, you can pick up over the counter cough syrup with guaifenesin and dextromethorphan. Feel free to talk with the pharmacist about which over the counter medication would be best for you. Also, I have provided you two prescriptions for your lower back pain. Make sure you wait until November 11th to pick up the second one.   Have a great day.

## 2015-04-07 NOTE — Progress Notes (Addendum)
   Subjective:    Patient ID: [name], female    DOB: June 10, 1961, 54 y.o.   MRN: [id number]  HPI  [name] is a 54 year old woman with a PMH of HTN, T2DM, CAD, and chronic lower back pain who comes to the clinic today for a cough and medication refills. She came to the clinic 2 weeks ago for a URI with symptoms of cough, congestion, headache, chills, nausea, and diarrhea. All of her symptoms except her cough have resolved. Her cough occurs throughout the day, can occasionally wake her at night, and is productive with clear sputum. She was previously prescribed guaifenesin with codeine which she reports was helpful, but she has run out.   She also complains of longstanding lower back pain which has worsened since running out of Norco 5-325. She says the Norco makes her more functional, enables her to complete chores and errands.   Review of Systems  Constitutional: Negative for fever, chills, diaphoresis and fatigue.  HENT: Negative for congestion, postnasal drip, rhinorrhea, sinus pressure and sore throat.   Respiratory: Positive for cough. Negative for shortness of breath.   Cardiovascular: Negative for chest pain and leg swelling.  Gastrointestinal: Negative for nausea, vomiting and diarrhea.  Genitourinary: Negative for dysuria.  Musculoskeletal: Positive for back pain.  Allergic/Immunologic: Negative for environmental allergies.       Objective:   Physical Exam  Constitutional: No distress.  HENT:  Mouth/Throat: Oropharynx is clear and moist. No oropharyngeal exudate.  No tonsillar erythema.  Eyes: EOM are normal. Pupils are equal, round, and reactive to light.  Cardiovascular: Normal rate, regular rhythm and normal heart sounds.   Pulmonary/Chest: Effort normal and breath sounds normal. No respiratory distress. She has no wheezes.  Abdominal: Soft. Bowel sounds are normal. She exhibits no distension.  Musculoskeletal: She exhibits no edema.  Lymphadenopathy:    She  has no cervical adenopathy.  Neurological: She is alert. She displays normal reflexes.  Skin: Skin is warm and dry.  Psychiatric: She has a normal mood and affect.  Vitals reviewed.         Assessment & Plan:  Please see problem-based assessment and plan for details.

## 2015-04-07 NOTE — Assessment & Plan Note (Signed)
Likely a post-viral URI cough, which can last up to 6 weeks after a URI.  - Educated patient that cough can persist long after a virus - Guaifenesin-Dextromethorphan cough syrup

## 2015-04-07 NOTE — Assessment & Plan Note (Signed)
Patient has initiated a pain contract with the Clinic. A July 2016 UDS was appropriate, and a review of the controlled substance database showed that she is only picking up our medications. She has been overdue for a Norco refill and has gone several days without a prescription - Refilled #90 tabs x2 to make it to her November visit with her PCP on November 18

## 2015-04-07 NOTE — Assessment & Plan Note (Signed)
Well-controlled on amitryptiline. Refilled this.

## 2015-04-07 NOTE — Telephone Encounter (Signed)
Pt seen in clinic today, refill done

## 2015-04-07 NOTE — Assessment & Plan Note (Addendum)
Filed Vitals:   04/07/15 0937  BP: 124/59  Pulse: 90  Temp: 98.2 F (36.8 C)   Well-controlled today. Continue  on Coreg 6.25 mg bid, Lasix 20 mg daily, and linopril 10 mg daily - Refill lisinopril

## 2015-04-07 NOTE — Progress Notes (Deleted)
   Subjective:    Patient ID: [name], female    DOB: [birth year], 54 y.o.   MRN: [id number]  HPI    Review of Systems     Objective:   Physical Exam        Assessment & Plan:

## 2015-04-08 DIAGNOSIS — G609 Hereditary and idiopathic neuropathy, unspecified: Secondary | ICD-10-CM | POA: Diagnosis not present

## 2015-04-08 NOTE — Progress Notes (Signed)
Internal Medicine Clinic Attending  I saw and evaluated the patient.  I personally confirmed the key portions of the history and exam documented by Dr. Marijean Bravo and I reviewed pertinent patient test results.  The assessment, diagnosis, and plan were formulated together and I agree with the documentation in the resident's note. Her opioid therapy is improving her physical function - able to walk up and down stairs to do laundry and clean; improve her social activities - go ou with daughter to run errands; and emotional - able to cope with her teenage son.

## 2015-04-09 DIAGNOSIS — G609 Hereditary and idiopathic neuropathy, unspecified: Secondary | ICD-10-CM | POA: Diagnosis not present

## 2015-04-10 DIAGNOSIS — G609 Hereditary and idiopathic neuropathy, unspecified: Secondary | ICD-10-CM | POA: Diagnosis not present

## 2015-04-11 DIAGNOSIS — G609 Hereditary and idiopathic neuropathy, unspecified: Secondary | ICD-10-CM | POA: Diagnosis not present

## 2015-04-19 DIAGNOSIS — G609 Hereditary and idiopathic neuropathy, unspecified: Secondary | ICD-10-CM | POA: Diagnosis not present

## 2015-04-20 DIAGNOSIS — G609 Hereditary and idiopathic neuropathy, unspecified: Secondary | ICD-10-CM | POA: Diagnosis not present

## 2015-04-21 DIAGNOSIS — G609 Hereditary and idiopathic neuropathy, unspecified: Secondary | ICD-10-CM | POA: Diagnosis not present

## 2015-04-22 DIAGNOSIS — G609 Hereditary and idiopathic neuropathy, unspecified: Secondary | ICD-10-CM | POA: Diagnosis not present

## 2015-04-23 DIAGNOSIS — G609 Hereditary and idiopathic neuropathy, unspecified: Secondary | ICD-10-CM | POA: Diagnosis not present

## 2015-04-24 DIAGNOSIS — G609 Hereditary and idiopathic neuropathy, unspecified: Secondary | ICD-10-CM | POA: Diagnosis not present

## 2015-04-25 DIAGNOSIS — G609 Hereditary and idiopathic neuropathy, unspecified: Secondary | ICD-10-CM | POA: Diagnosis not present

## 2015-04-26 DIAGNOSIS — G609 Hereditary and idiopathic neuropathy, unspecified: Secondary | ICD-10-CM | POA: Diagnosis not present

## 2015-04-27 DIAGNOSIS — G609 Hereditary and idiopathic neuropathy, unspecified: Secondary | ICD-10-CM | POA: Diagnosis not present

## 2015-04-28 DIAGNOSIS — G609 Hereditary and idiopathic neuropathy, unspecified: Secondary | ICD-10-CM | POA: Diagnosis not present

## 2015-04-29 ENCOUNTER — Telehealth: Payer: Self-pay | Admitting: Dietician

## 2015-04-29 DIAGNOSIS — G609 Hereditary and idiopathic neuropathy, unspecified: Secondary | ICD-10-CM | POA: Diagnosis not present

## 2015-04-29 NOTE — Telephone Encounter (Signed)
Where toenail was removed is not healing, increased pain  and turning black, she was concerned and went back to foot doctor but they would not see her without another referral. Told her I think she needs to be seen here ASAP and will talk to triage nurse.

## 2015-04-29 NOTE — Telephone Encounter (Signed)
Called pt made appt for frid 11/4 at 1515 dr Marvel Plan

## 2015-04-30 ENCOUNTER — Emergency Department (HOSPITAL_COMMUNITY): Payer: Commercial Managed Care - HMO

## 2015-04-30 ENCOUNTER — Encounter (HOSPITAL_COMMUNITY): Payer: Self-pay | Admitting: *Deleted

## 2015-04-30 ENCOUNTER — Observation Stay (HOSPITAL_COMMUNITY)
Admission: EM | Admit: 2015-04-30 | Discharge: 2015-05-01 | Disposition: A | Discharge status: Home / Self Care | Payer: Commercial Managed Care - HMO | Attending: Cardiovascular Disease | Admitting: Cardiovascular Disease

## 2015-04-30 DIAGNOSIS — E1140 Type 2 diabetes mellitus with diabetic neuropathy, unspecified: Secondary | ICD-10-CM | POA: Diagnosis present

## 2015-04-30 DIAGNOSIS — R42 Dizziness and giddiness: Secondary | ICD-10-CM | POA: Diagnosis not present

## 2015-04-30 DIAGNOSIS — E11319 Type 2 diabetes mellitus with unspecified diabetic retinopathy without macular edema: Secondary | ICD-10-CM | POA: Diagnosis not present

## 2015-04-30 DIAGNOSIS — R072 Precordial pain: Secondary | ICD-10-CM

## 2015-04-30 DIAGNOSIS — I10 Essential (primary) hypertension: Secondary | ICD-10-CM | POA: Diagnosis not present

## 2015-04-30 DIAGNOSIS — I25119 Atherosclerotic heart disease of native coronary artery with unspecified angina pectoris: Secondary | ICD-10-CM

## 2015-04-30 DIAGNOSIS — IMO0002 Reserved for concepts with insufficient information to code with codable children: Secondary | ICD-10-CM | POA: Diagnosis present

## 2015-04-30 DIAGNOSIS — E1165 Type 2 diabetes mellitus with hyperglycemia: Secondary | ICD-10-CM | POA: Diagnosis not present

## 2015-04-30 DIAGNOSIS — Z7982 Long term (current) use of aspirin: Secondary | ICD-10-CM | POA: Insufficient documentation

## 2015-04-30 DIAGNOSIS — R079 Chest pain, unspecified: Principal | ICD-10-CM | POA: Insufficient documentation

## 2015-04-30 DIAGNOSIS — I5022 Chronic systolic (congestive) heart failure: Secondary | ICD-10-CM | POA: Insufficient documentation

## 2015-04-30 DIAGNOSIS — Z87891 Personal history of nicotine dependence: Secondary | ICD-10-CM | POA: Insufficient documentation

## 2015-04-30 DIAGNOSIS — I2510 Atherosclerotic heart disease of native coronary artery without angina pectoris: Secondary | ICD-10-CM | POA: Diagnosis not present

## 2015-04-30 DIAGNOSIS — E785 Hyperlipidemia, unspecified: Secondary | ICD-10-CM | POA: Insufficient documentation

## 2015-04-30 DIAGNOSIS — Z794 Long term (current) use of insulin: Secondary | ICD-10-CM | POA: Insufficient documentation

## 2015-04-30 DIAGNOSIS — I429 Cardiomyopathy, unspecified: Secondary | ICD-10-CM

## 2015-04-30 DIAGNOSIS — R0602 Shortness of breath: Secondary | ICD-10-CM | POA: Diagnosis not present

## 2015-04-30 DIAGNOSIS — G609 Hereditary and idiopathic neuropathy, unspecified: Secondary | ICD-10-CM | POA: Diagnosis not present

## 2015-04-30 LAB — BRAIN NATRIURETIC PEPTIDE: B Natriuretic Peptide: 29.3 pg/mL (ref 0.0–100.0)

## 2015-04-30 LAB — BASIC METABOLIC PANEL
ANION GAP: 8 (ref 5–15)
BUN: 13 mg/dL (ref 6–20)
CO2: 27 mmol/L (ref 22–32)
Calcium: 9.4 mg/dL (ref 8.9–10.3)
Chloride: 103 mmol/L (ref 101–111)
Creatinine, Ser: 0.9 mg/dL (ref 0.44–1.00)
GLUCOSE: 73 mg/dL (ref 65–99)
POTASSIUM: 4.2 mmol/L (ref 3.5–5.1)
Sodium: 138 mmol/L (ref 135–145)

## 2015-04-30 LAB — CBC
HEMATOCRIT: 36.9 % (ref 36.0–46.0)
HEMOGLOBIN: 11.8 g/dL — AB (ref 12.0–15.0)
MCH: 27.9 pg (ref 26.0–34.0)
MCHC: 32 g/dL (ref 30.0–36.0)
MCV: 87.2 fL (ref 78.0–100.0)
Platelets: 280 10*3/uL (ref 150–400)
RBC: 4.23 MIL/uL (ref 3.87–5.11)
RDW: 14.3 % (ref 11.5–15.5)
WBC: 10.8 10*3/uL — AB (ref 4.0–10.5)

## 2015-04-30 LAB — I-STAT TROPONIN, ED
TROPONIN I, POC: 0 ng/mL (ref 0.00–0.08)
Troponin i, poc: 0 ng/mL (ref 0.00–0.08)

## 2015-04-30 LAB — GLUCOSE, CAPILLARY: Glucose-Capillary: 160 mg/dL — ABNORMAL HIGH (ref 65–99)

## 2015-04-30 LAB — TSH: TSH: 3.377 u[IU]/mL (ref 0.350–4.500)

## 2015-04-30 LAB — TROPONIN I: Troponin I: <0.03 ng/mL (ref <0.031)

## 2015-04-30 MED ORDER — LISINOPRIL 10 MG PO TABS
10.0000 mg | ORAL_TABLET | Freq: Every day | ORAL | Status: DC
  Administered 2015-05-01: 10 mg via ORAL
  Filled 2015-04-30: qty 1

## 2015-04-30 MED ORDER — FUROSEMIDE 40 MG PO TABS
40.0000 mg | ORAL_TABLET | Freq: Every day | ORAL | Status: DC
  Administered 2015-05-01: 40 mg via ORAL
  Filled 2015-04-30: qty 1

## 2015-04-30 MED ORDER — NITROGLYCERIN 0.4 MG SL SUBL
0.4000 mg | SUBLINGUAL_TABLET | SUBLINGUAL | Status: DC | PRN
  Administered 2015-04-30: 0.4 mg via SUBLINGUAL
  Filled 2015-04-30: qty 1

## 2015-04-30 MED ORDER — AMITRIPTYLINE HCL 25 MG PO TABS
25.0000 mg | ORAL_TABLET | Freq: Every day | ORAL | Status: DC
  Administered 2015-05-01: 25 mg via ORAL
  Filled 2015-04-30 (×2): qty 1

## 2015-04-30 MED ORDER — DORZOLAMIDE HCL-TIMOLOL MAL 2-0.5 % OP SOLN
1.0000 [drp] | Freq: Two times a day (BID) | OPHTHALMIC | Status: DC
Start: 2015-04-30 — End: 2015-05-01
  Administered 2015-04-30 – 2015-05-01 (×2): 1 [drp] via OPHTHALMIC
  Filled 2015-04-30: qty 10

## 2015-04-30 MED ORDER — MORPHINE SULFATE (PF) 4 MG/ML IV SOLN
4.0000 mg | INTRAVENOUS | Status: DC | PRN
  Administered 2015-04-30 – 2015-05-01 (×2): 4 mg via INTRAVENOUS
  Filled 2015-04-30 (×2): qty 1

## 2015-04-30 MED ORDER — CYCLOBENZAPRINE HCL 5 MG PO TABS
5.0000 mg | ORAL_TABLET | Freq: Every day | ORAL | Status: DC
  Administered 2015-04-30: 5 mg via ORAL
  Filled 2015-04-30: qty 1

## 2015-04-30 MED ORDER — ONDANSETRON HCL 4 MG/2ML IJ SOLN
4.0000 mg | Freq: Once | INTRAMUSCULAR | Status: AC
  Administered 2015-04-30: 4 mg via INTRAVENOUS
  Filled 2015-04-30: qty 2

## 2015-04-30 MED ORDER — ONDANSETRON HCL 4 MG/2ML IJ SOLN: 4.0000 mg | Freq: Four times a day (QID) | INTRAMUSCULAR | Status: DC | PRN

## 2015-04-30 MED ORDER — MORPHINE SULFATE (PF) 4 MG/ML IV SOLN
4.0000 mg | Freq: Once | INTRAVENOUS | Status: AC
  Administered 2015-04-30: 4 mg via INTRAVENOUS
  Filled 2015-04-30: qty 1

## 2015-04-30 MED ORDER — FERROUS SULFATE 325 (65 FE) MG PO TABS: 325.0000 mg | ORAL_TABLET | Freq: Every day | ORAL | Status: DC

## 2015-04-30 MED ORDER — ASPIRIN 81 MG PO CHEW
324.0000 mg | CHEWABLE_TABLET | Freq: Once | ORAL | Status: AC
  Administered 2015-04-30: 324 mg via ORAL
  Filled 2015-04-30: qty 4

## 2015-04-30 MED ORDER — PRAVASTATIN SODIUM 20 MG PO TABS
20.0000 mg | ORAL_TABLET | Freq: Every day | ORAL | Status: DC
  Administered 2015-04-30 – 2015-05-01 (×2): 20 mg via ORAL
  Filled 2015-04-30 (×2): qty 1

## 2015-04-30 MED ORDER — PANTOPRAZOLE SODIUM 40 MG PO TBEC
40.0000 mg | DELAYED_RELEASE_TABLET | Freq: Every day | ORAL | Status: DC
  Administered 2015-05-01: 40 mg via ORAL
  Filled 2015-04-30: qty 1

## 2015-04-30 MED ORDER — GABAPENTIN 300 MG PO CAPS
300.0000 mg | ORAL_CAPSULE | Freq: Three times a day (TID) | ORAL | Status: DC
  Administered 2015-04-30 – 2015-05-01 (×2): 300 mg via ORAL
  Filled 2015-04-30 (×2): qty 1

## 2015-04-30 MED ORDER — CARVEDILOL 6.25 MG PO TABS
6.2500 mg | ORAL_TABLET | Freq: Two times a day (BID) | ORAL | Status: DC
  Administered 2015-04-30: 6.25 mg via ORAL
  Filled 2015-04-30: qty 1

## 2015-04-30 MED ORDER — ACETAMINOPHEN 325 MG PO TABS: 650.0000 mg | ORAL_TABLET | ORAL | Status: DC | PRN

## 2015-04-30 MED ORDER — FERROUS SULFATE 325 (65 FE) MG PO TABS
325.0000 mg | ORAL_TABLET | Freq: Every day | ORAL | Status: DC
  Administered 2015-05-01: 325 mg via ORAL
  Filled 2015-04-30: qty 1

## 2015-04-30 MED ORDER — CARVEDILOL 6.25 MG PO TABS
6.2500 mg | ORAL_TABLET | Freq: Two times a day (BID) | ORAL | Status: DC
  Administered 2015-05-01: 6.25 mg via ORAL
  Filled 2015-04-30 (×2): qty 1

## 2015-04-30 MED ORDER — ASPIRIN EC 81 MG PO TBEC
81.0000 mg | DELAYED_RELEASE_TABLET | Freq: Every day | ORAL | Status: DC
  Administered 2015-05-01: 81 mg via ORAL
  Filled 2015-04-30: qty 1

## 2015-04-30 NOTE — ED Notes (Signed)
Pt started having substernal chest pain last night and became nauseous and short of breath. Pt laid down and woke up with worensing chest pain now radiating into left arm. Pt is sob only on exertion. Pt is warm and dry.

## 2015-04-30 NOTE — ED Notes (Signed)
Spoke with MD Croitoru regarding patient chest pain. Notified of patient chest pain and nitro given with minimal pain changes. States he does not feel the is coronary and is comfortable with patient remaining with telemetry bed assignment. Further pain medication orders will be placed.

## 2015-04-30 NOTE — ED Notes (Signed)
Attempted report 

## 2015-04-30 NOTE — ED Notes (Signed)
Spoke with PA about giving pt Nitro, due to pt CHF, will not give pt nitro at this time.

## 2015-04-30 NOTE — ED Notes (Signed)
Pt given snack as requested. 

## 2015-04-30 NOTE — ED Provider Notes (Signed)
CSN: [id number]     Arrival date & time 04/30/15  1126 History   First MD Initiated Contact with Patient 04/30/15 1143     Chief Complaint  Patient presents with  . Chest Pain     (Consider location/radiation/quality/duration/timing/severity/associated sxs/prior Treatment) HPI   [name] is a 54 y.o F with a past mental history of systolic heart failure, nonischemic cardiomyopathy, CAD with history of heart catheterization in 2008 that revealed 50% stenosis of circumflex artery presents to the emergency Department complaining of chest pain. Patient states that last night she was laying down when she felt sudden onset substernal chest pain that radiates into her left arm. Patient associated dizziness and shortness of breath. Patient said chest pain in the past but reports that this chest pain is worse and feels much different. She has never had pain radiating into her arm before. Denies syncope, headache, blurred vision, numbness, tingling, weakness.  Past Medical History  Diagnosis Date  . Hypertension   . Hyperlipidemia   . GERD (gastroesophageal reflux disease)   . Adhesive capsulitis of shoulder     bilateral, Steroid injection Dr. Truman Hayward 1/12 bilaterally  . Peripheral neuropathy ([place])   . Adhesive capsulitis of right shoulder     Steroid injection Dr. Truman Hayward 1/12  . Adhesive capsulitis of left shoulder     Steroid injection Dr. Truman Hayward 1/12  . Obesity   . Diabetic retinopathy   . Glaucoma   . Diabetes mellitus     Type II, insulin dependent  . CAD (coronary artery disease)     nonobstructive. Last cardiac cath (2008) showing left circumflex with mid 50% stenosis and distal luminal irregularities. Also with RCA with mid to distal 30-40% stenosis. // Previously evaluated by [name], never followed up outpatient.  . CHF (congestive heart failure) ([place])   . CAP (community acquired pneumonia) 06/15/2012    05/2012 CXR: Mild opacification of the posterior lung base on the  lateral film  as cannot exclude infection/atelectasis.     Past Surgical History  Procedure Laterality Date  . Cataract extraction    . Glaucoma surgery    . Cardiac catheterization    . Tubal ligation    . Left heart catheterization with coronary angiogram N/A 01/24/2012    Procedure: LEFT HEART CATHETERIZATION WITH CORONARY ANGIOGRAM;  Surgeon: Sherren Mocha, MD;  Location: [name] CATH LAB;  Service: Cardiovascular;  Laterality: N/A;   Family History  Problem Relation Age of Onset  . Hypertension Sister   . Hypertension Sister   . Hypertension Sister    Social History  Substance Use Topics  . Smoking status: Former Smoker -- 0.30 packs/day for 7 years    Types: Cigarettes    Quit date: 06/27/2006  . Smokeless tobacco: Never Used  . Alcohol Use: No   OB History    No data available     Review of Systems  All other systems reviewed and are negative.     Allergies  Review of patient's allergies indicates no known allergies.  Home Medications   Prior to Admission medications   Medication Sig Start Date End Date Taking? Authorizing Provider  amitriptyline (ELAVIL) 25 MG tablet Take 1 tablet (25 mg total) by mouth at bedtime. 04/07/15  Yes Liberty Handy, MD  aspirin EC 325 MG EC tablet Take 1 tablet (325 mg total) by mouth daily. 08/23/13  Yes Gary Fleet, PA-C  carvedilol (COREG) 6.25 MG tablet Take 1 tablet (6.25 mg total) by mouth 2 (two) times  daily. 12/04/14  Yes Luan Moore, MD  cyclobenzaprine (FLEXERIL) 5 MG tablet Take 1 tablet (5 mg total) by mouth at bedtime. 10/24/14  Yes Luan Moore, MD  dorzolamide-timolol (COSOPT) 22.3-6.8 MG/ML ophthalmic solution Place 1 drop into the right eye 2 (two) times daily.   Yes Historical Provider, MD  ferrous sulfate 325 (65 FE) MG tablet Take 1 tablet (325 mg total) by mouth daily with breakfast. 06/15/12  Yes Dorian Heckle, MD  furosemide (LASIX) 40 MG tablet Take 0.5 tablets (20 mg total) by mouth daily. 10/31/14  Yes Tasrif Ahmed,  MD  gabapentin (NEURONTIN) 300 MG capsule TAKE 1 CAPSULE THREE TIMES DAILY 12/22/14  Yes Sid Falcon, MD  HYDROcodone-acetaminophen (NORCO/VICODIN) 5-325 MG tablet Take 1 tablet by mouth every 6 (six) hours as needed for moderate pain (pain). Do not pick up before May 08, 2015. 05/08/15  Yes Liberty Handy, MD  insulin aspart (NOVOLOG FLEXPEN) 100 UNIT/ML FlexPen INJECT 4-6 UNITS INTO THE SKIN THREE TIMES DAILY BEFORE A MEAL.*(4 UNITS IN THE AM AND 6 UNITS IN THE PM) Patient taking differently: INJECT 3-5 UNITS INTO THE SKIN THREE TIMES DAILY BEFORE A MEAL.*(4 UNITS IN THE AM AND 6 UNITS IN THE PM) 04/07/15  Yes Liberty Handy, MD  Insulin Glargine (LANTUS SOLOSTAR) 100 UNIT/ML Solostar Pen Inject 44 Units into the skin at bedtime. 01/30/15  Yes Collier Salina, MD  lisinopril (PRINIVIL,ZESTRIL) 20 MG tablet Take 0.5 tablets (10 mg total) by mouth daily. 04/07/15  Yes Liberty Handy, MD  metFORMIN (GLUCOPHAGE) 1000 MG tablet TAKE 1 TABLET BY MOUTH TWICE DAILY WITH MEALS 02/23/15  Yes Collier Salina, MD  pantoprazole (PROTONIX) 40 MG tablet Take 1 tablet (40 mg total) by mouth daily. 02/19/15  Yes Tasrif Ahmed, MD  pravastatin (PRAVACHOL) 20 MG tablet Take 1 tablet (20 mg total) by mouth daily. 12/04/14  Yes Luan Moore, MD  triamcinolone cream (KENALOG) 0.1 % Apply 1 application topically 2 (two) times daily. 02/19/15  Yes Tasrif Ahmed, MD  glucose blood (ONETOUCH VERIO) test strip Check blood sugar as instructed up to 3 times a day 02/26/14   Ejiroghene E Emokpae, MD  guaiFENesin-dextromethorphan (ROBITUSSIN DM) 100-10 MG/5ML syrup Take 5 mLs by mouth every 4 (four) hours as needed for cough. 04/07/15   Liberty Handy, MD  Insulin Pen Needle (B-D UF III MINI PEN NEEDLES) 31G X 5 MM MISC Use to inject insulin 4-5 times daily    Madilyn Fireman, MD  meclizine (ANTIVERT) 50 MG tablet Take 1 tablet (50 mg total) by mouth 2 (two) times daily. Patient not taking: Reported on 04/30/2015 10/24/14   Luan Moore, MD   naproxen (NAPROSYN) 500 MG tablet Take 1 tablet (500 mg total) by mouth 2 (two) times daily with a meal. Patient not taking: Reported on 10/23/2014 07/11/14   Lucious Groves, DO  ondansetron (ZOFRAN) 4 MG tablet Take 1 tablet (4 mg total) by mouth every 8 (eight) hours as needed for nausea or vomiting. 03/31/15   Corky Sox, MD  [facility] DELICA LANCETS FINE MISC Check blood sugar as instructed up to 3 times a day Patient not taking: Reported on 04/30/2015 02/26/14   Ejiroghene E Emokpae, MD  permethrin (ELIMITE) 5 % cream APPLY TO ENTIRE BODY NECK DOWN, LEAVE ON 14 HOURS AND WASH OFF. REPEAT IN 1 WEEK AS NEEDED Patient not taking: Reported on 04/30/2015 03/16/15   Collier Salina, MD   BP 162/59 mmHg  Pulse 78  Temp(Src) 98.1 F (36.7  C) (Oral)  Resp 13  Ht 5\' 6"  (1.676 m)  Wt 240 lb (108.863 kg)  BMI 38.76 kg/m2  SpO2 100%  LMP 02/13/2011 Physical Exam  Constitutional: She is oriented to person, place, and time. She appears well-developed and well-nourished. No distress.  HENT:  Head: Normocephalic and atraumatic.  Mouth/Throat: Oropharynx is clear and moist. No oropharyngeal exudate.  Eyes: Conjunctivae and EOM are normal. Pupils are equal, round, and reactive to light. Right eye exhibits no discharge. Left eye exhibits no discharge. No scleral icterus.  Neck: Neck supple. No JVD present.  Cardiovascular: Normal rate, regular rhythm, normal heart sounds and intact distal pulses.  Exam reveals no gallop and no friction rub.   No murmur heard. Pulmonary/Chest: Effort normal and breath sounds normal. No respiratory distress. She has no wheezes. She has no rales. She exhibits tenderness.  Abdominal: Soft. Bowel sounds are normal. She exhibits no distension and no mass. There is no tenderness. There is no rebound and no guarding.  Musculoskeletal: Normal range of motion. She exhibits no edema or tenderness.  Lymphadenopathy:    She has no cervical adenopathy.  Neurological: She is alert  and oriented to person, place, and time. No cranial nerve deficit.  Strength 5/5 throughout. No sensory deficits.  No gait abnormality  Skin: Skin is warm and dry. No rash noted. She is not diaphoretic. No erythema. No pallor.  Psychiatric: She has a normal mood and affect. Her behavior is normal.  Nursing note and vitals reviewed.   ED Course  Procedures (including critical care time) Labs Review Labs Reviewed  CBC - Abnormal; Notable for the following:    WBC 10.8 (*)    Hemoglobin 11.8 (*)    All other components within normal limits  BASIC METABOLIC PANEL  BRAIN NATRIURETIC PEPTIDE  I-STAT TROPOININ, ED  Randolm Idol, ED    Imaging Review Dg Chest 2 View  04/30/2015  CLINICAL DATA:  Chest pain EXAM: CHEST  2 VIEW COMPARISON:  03/31/2015 FINDINGS: The heart size and mediastinal contours are within normal limits. Both lungs are clear. The visualized skeletal structures are unremarkable. IMPRESSION: No active cardiopulmonary disease. Electronically Signed   By: Kerby Moors M.D.   On: 04/30/2015 13:10   I have personally reviewed and evaluated these images and lab results as part of my medical decision-making.   EKG Interpretation None      MDM   Final diagnoses:  Chest pain    54 y.o F with history of CHF, CAD presents with chest pain onset last night. Last catheterization in 2007. Chest pain radiates to left arm. Pt has not had pain like this before. Pain is not pleuritic in nature. EKG unchanged from previous. Initial troponin negative. Low Well's criteria. No tachycardia, hypoxia. PT with moderate-high risk for ACS. Due to pt risk factors, will consult cardiology for admission for observation.   Pt given IV morphine and ASA. Chest pain decreased, but still persists.  Pt in NAD. Awaiting floor bed.   Dondra Spry [place], PA-C 05/02/15 1117  Debby Freiberg, MD 05/07/15 1414

## 2015-04-30 NOTE — ED Notes (Signed)
Cardiology at bedside.

## 2015-04-30 NOTE — H&P (Signed)
Patient ID: [name] MRN: [id number], DOB/AGE: 1960/11/26   Admit date: 04/30/2015   Primary Physician: Hinton Lovely, MD Primary Cardiologist: Dr. Burt Knack  Pt. Profile:  [name] is a 54 y.o. female with a history of systolic CHF, NICM, HTN, HL, GERD, DM with retinopathy, chronic lower back pain and former smoker (quit 2014)  who presented 11/3 to [facility] ED for evaluation of worsening intermittent CP.   HPI: As above. She was diagnosed with a nonischemic cardiomyopathy and systolic CHF in 07/5051. Echocardiogram 7/13: limited study, EF 35-40%. LHC 7/13 showed Mid LAD 50% within a myocardial bridge, EF 40-45%. Last seen by Dr. Burt Knack 11/13. Last Echocardiogram 3/14: Limited study, mild LVH, EF probably normal.  She was doing well on cardiac stand point when seen last in clinic 12/02/12. She was getting her medication from primary care provider.  For the past few weeks intermittently she is having sharp chest pain that has been getting worse. Approximately 3-4 weeks ago she was treated for possible viral upper respiratory infection. She states that this chest pain is different from her cough chest discomfort. Last night she had substernal sharp chest pain that is radiated to her left shoulder while laying down that was associated with shortness of breath. Due to worsening chest pain she presented to ED for further evaluation. She also states that intermittently she is having dizziness or shortness of breath. She cannot lay flat for the past many months and has ower extremity edema The patient denies nausea, vomiting, fever, palpitations, PND, dizziness, syncope, cough, congestion, abdominal pain, hematochezia or melena.  In ED, point-of-care troponin negative. BNP 29. Chest x-ray without acute cardiopulmonary disease. EKG showed a normal sinus rhythm at rate of 83 bpm, no acute abnormality.   Problem List  Past Medical History  Diagnosis Date  . Hypertension   . Hyperlipidemia    . GERD (gastroesophageal reflux disease)   . Adhesive capsulitis of shoulder     bilateral, Steroid injection Dr. Truman Hayward 1/12 bilaterally  . Peripheral neuropathy ([place])   . Adhesive capsulitis of right shoulder     Steroid injection Dr. Truman Hayward 1/12  . Adhesive capsulitis of left shoulder     Steroid injection Dr. Truman Hayward 1/12  . Obesity   . Diabetic retinopathy   . Glaucoma   . Diabetes mellitus     Type II, insulin dependent  . CAD (coronary artery disease)     nonobstructive. Last cardiac cath (2008) showing left circumflex with mid 50% stenosis and distal luminal irregularities. Also with RCA with mid to distal 30-40% stenosis. // Previously evaluated by [facility], never followed up outpatient.  . CHF (congestive heart failure) ([place])   . CAP (community acquired pneumonia) 06/15/2012    05/2012 CXR: Mild opacification of the posterior lung base on the lateral film  as cannot exclude infection/atelectasis.      Past Surgical History  Procedure Laterality Date  . Cataract extraction    . Glaucoma surgery    . Cardiac catheterization    . Tubal ligation    . Left heart catheterization with coronary angiogram N/A 01/24/2012    Procedure: LEFT HEART CATHETERIZATION WITH CORONARY ANGIOGRAM;  Surgeon: Sherren Mocha, MD;  Location: [facility] CATH LAB;  Service: Cardiovascular;  Laterality: N/A;     Allergies  No Known Allergies   Home Medications  Prior to Admission medications   Medication Sig Start Date End Date Taking? Authorizing Provider  amitriptyline (ELAVIL) 25 MG tablet Take  1 tablet (25 mg total) by mouth at bedtime. 04/07/15  Yes Liberty Handy, MD  aspirin EC 325 MG EC tablet Take 1 tablet (325 mg total) by mouth daily. 08/23/13  Yes Gary Fleet, PA-C  carvedilol (COREG) 6.25 MG tablet Take 1 tablet (6.25 mg total) by mouth 2 (two) times daily. 12/04/14  Yes Luan Moore, MD  cyclobenzaprine (FLEXERIL) 5 MG tablet Take 1 tablet (5 mg total) by mouth at bedtime. 10/24/14  Yes  Luan Moore, MD  dorzolamide-timolol (COSOPT) 22.3-6.8 MG/ML ophthalmic solution Place 1 drop into the right eye 2 (two) times daily.   Yes Historical Provider, MD  ferrous sulfate 325 (65 FE) MG tablet Take 1 tablet (325 mg total) by mouth daily with breakfast. 06/15/12  Yes Dorian Heckle, MD  furosemide (LASIX) 40 MG tablet Take 0.5 tablets (20 mg total) by mouth daily. 10/31/14  Yes Tasrif Ahmed, MD  gabapentin (NEURONTIN) 300 MG capsule TAKE 1 CAPSULE THREE TIMES DAILY 12/22/14  Yes Sid Falcon, MD  HYDROcodone-acetaminophen (NORCO/VICODIN) 5-325 MG tablet Take 1 tablet by mouth every 6 (six) hours as needed for moderate pain (pain). Do not pick up before May 08, 2015. 05/08/15  Yes Liberty Handy, MD  insulin aspart (NOVOLOG FLEXPEN) 100 UNIT/ML FlexPen INJECT 4-6 UNITS INTO THE SKIN THREE TIMES DAILY BEFORE A MEAL.*(4 UNITS IN THE AM AND 6 UNITS IN THE PM) Patient taking differently: INJECT 3-5 UNITS INTO THE SKIN THREE TIMES DAILY BEFORE A MEAL.*(4 UNITS IN THE AM AND 6 UNITS IN THE PM) 04/07/15  Yes Liberty Handy, MD  Insulin Glargine (LANTUS SOLOSTAR) 100 UNIT/ML Solostar Pen Inject 44 Units into the skin at bedtime. 01/30/15  Yes Collier Salina, MD  lisinopril (PRINIVIL,ZESTRIL) 20 MG tablet Take 0.5 tablets (10 mg total) by mouth daily. 04/07/15  Yes Liberty Handy, MD  metFORMIN (GLUCOPHAGE) 1000 MG tablet TAKE 1 TABLET BY MOUTH TWICE DAILY WITH MEALS 02/23/15  Yes Collier Salina, MD  pantoprazole (PROTONIX) 40 MG tablet Take 1 tablet (40 mg total) by mouth daily. 02/19/15  Yes Tasrif Ahmed, MD  pravastatin (PRAVACHOL) 20 MG tablet Take 1 tablet (20 mg total) by mouth daily. 12/04/14  Yes Luan Moore, MD  triamcinolone cream (KENALOG) 0.1 % Apply 1 application topically 2 (two) times daily. 02/19/15  Yes Tasrif Ahmed, MD  glucose blood (ONETOUCH VERIO) test strip Check blood sugar as instructed up to 3 times a day 02/26/14   Ejiroghene E Emokpae, MD  guaiFENesin-dextromethorphan (ROBITUSSIN  DM) 100-10 MG/5ML syrup Take 5 mLs by mouth every 4 (four) hours as needed for cough. 04/07/15   Liberty Handy, MD  Insulin Pen Needle (B-D UF III MINI PEN NEEDLES) 31G X 5 MM MISC Use to inject insulin 4-5 times daily    Madilyn Fireman, MD  meclizine (ANTIVERT) 50 MG tablet Take 1 tablet (50 mg total) by mouth 2 (two) times daily. Patient not taking: Reported on 04/30/2015 10/24/14   Luan Moore, MD  naproxen (NAPROSYN) 500 MG tablet Take 1 tablet (500 mg total) by mouth 2 (two) times daily with a meal. Patient not taking: Reported on 10/23/2014 07/11/14   Lucious Groves, DO  ondansetron (ZOFRAN) 4 MG tablet Take 1 tablet (4 mg total) by mouth every 8 (eight) hours as needed for nausea or vomiting. 03/31/15   Corky Sox, MD  [name] DELICA LANCETS FINE MISC Check blood sugar as instructed up to 3 times a day Patient not taking: Reported on 04/30/2015 02/26/14   Ejiroghene  E Emokpae, MD  permethrin (ELIMITE) 5 % cream APPLY TO ENTIRE BODY NECK DOWN, LEAVE ON 14 HOURS AND WASH OFF. REPEAT IN 1 WEEK AS NEEDED Patient not taking: Reported on 04/30/2015 03/16/15   Collier Salina, MD    Family History  Family History  Problem Relation Age of Onset  . Hypertension Sister   . Hypertension Sister   . Hypertension Sister    No family status information on file.     Social History  Social History   Social History  . Marital Status: Single    Spouse Name: N/A  . Number of Children: 2  . Years of Education: 12th grade   Occupational History  . 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    Social History Main Topics  . Smoking status: Former Smoker -- 0.30 packs/day for 7 years    Types: Cigarettes    Quit date: 06/27/2006  . Smokeless tobacco: Never Used  . Alcohol Use: No  . Drug Use: No  . Sexual Activity: Not Currently   Other Topics Concern  . Not on file   Social History Narrative   Lives with her kids, 60yo  daughter and 84 yo adopted son.      All other systems reviewed and are otherwise negative except as noted above.  Physical Exam  Blood pressure 162/59, pulse 78, temperature 98.1 F (36.7 C), temperature source Oral, resp. rate 13, height 5\' 6"  (1.676 m), weight 240 lb (108.863 kg), last menstrual period 02/13/2011, SpO2 100 %.  General: Pleasant, obese woman in  NAD Psych: Normal affect. Neuro: Alert and oriented X 3. Moves all extremities spontaneously. HEENT: Normal  Neck: Supple without bruits or JVD. Lungs:  Resp regular and unlabored, CTA. TTP over substernal area.  Heart: RRR no s3, s4, or murmurs. Abdomen: Soft, non-tender, non-distended, BS + x 4.  Extremities: No clubbing, cyanosis. DP/PT/Radials 2+ and equal bilaterally. Trace to 1+ edema.   Labs  No results for input(s): CKTOTAL, CKMB, TROPONINI in the last 72 hours. Lab Results  Component Value Date   WBC 10.8* 04/30/2015   HGB 11.8* 04/30/2015   HCT 36.9 04/30/2015   MCV 87.2 04/30/2015   PLT 280 04/30/2015    Recent Labs Lab 04/30/15 1152  NA 138  K 4.2  CL 103  CO2 27  BUN 13  CREATININE 0.90  CALCIUM 9.4  GLUCOSE 73   Lab Results  Component Value Date   CHOL 155 01/05/2015   HDL 54 01/05/2015   LDLCALC 84 01/05/2015   TRIG 84 01/05/2015    Echo 09/07/12 Study Conclusions  - Left ventricle: The EF is probably normal. The cavity size was normal. Wall thickness was increased in a pattern of mild LVH. Images were inadequate for LV wall motion assessment. - Aortic valve: Poorly visualized. The valve appears to be grossly normal. - Mitral valve: Poorly visualized. The valve appears to be grossly normal. - Impressions: Extremely limited study. Only the parasternal views are interpretable. No cardiac source of embolism was identified, but cannot be ruled out on the basis of this examination. Impressions:  - Extremely limited study. Only the parasternal views  are interpretable. No cardiac source of embolism was identified, but cannot be ruled out on the basis of this examination.  Radiology/Studies  Dg Chest 2 View  04/30/2015  CLINICAL DATA:  Chest pain EXAM: CHEST  2 VIEW COMPARISON:  03/31/2015 FINDINGS:  The heart size and mediastinal contours are within normal limits. Both lungs are clear. The visualized skeletal structures are unremarkable. IMPRESSION: No active cardiopulmonary disease. Electronically Signed   By: Kerby Moors M.D.   On: 04/30/2015 13:10   Dg Chest 2 View  03/31/2015  CLINICAL DATA:  Soreness with shortness of breath and cough for 1.5 weeks. EXAM: CHEST  2 VIEW COMPARISON:  10/23/2014 FINDINGS: The heart size and mediastinal contours are within normal limits. Both lungs are clear. The visualized skeletal structures are unremarkable. IMPRESSION: No active cardiopulmonary disease. Electronically Signed   By: Kathreen Devoid   On: 03/31/2015 15:33    ECG Vent. rate 83 BPM PR interval 160 ms QRS duration 86 ms QT/QTc 356/418 ms  ASSESSMENT AND PLAN   1. Chest pain - has both typical and atypical. Intermittent worsening substernal sharp chest pain. Last episode last night that was radiated to her left shoulder. Pain is reproducible with palpation.  - Given ecent viral URI differential include costochondritis versus pericarditis. -Point-of-care troponin negative. EKG showed a normal sinus rhythm at rate of 83 bpm, no acute abnormality. - Cycle enzyme. Keep npo if Myoview needed in morning.   2. Chronic Systolic CHF -   BNP 29. Chest x-ray without acute cardiopulmonary disease.  - Lungs clear to auscultation. However has trace to 1+ lower approximately edema. She takes Lasix 20 mg daily at home. - Echocardiogram 7/13: limited study, EF 35-40%.  - Last Echocardiogram 3/14: Limited study, mild LVH, EF probably normal - Will repeat echo, give one dose of IV lasix 40mg  today and switch to 40mg  po tomorrow.   3. CAD -  LHC 7/13 showed Mid LAD 50% within a myocardial bridge, EF 40-45%.  - continue coreg 6.25mg  BID, lisinopril 10mg  qd, ASA and pravastatin 20mg   4. Hypertension - Relatively stable except last reading 162/59. Continue current therapy.  5. Hyperlipidemia - 01/05/2015: Cholesterol 155; HDL 54; LDL Cholesterol 84; Triglycerides 84; VLDL 17  - Continue statin  6. DM with retinopathy - HgbA1c 8.1 - 02/19/15  Signed, Leanor Kail, PA-C 04/30/2015, 2:51 PM Pager [phone]  I have seen and examined the patient along with Bhagat,Bhavinkumar, PA-C.  I have reviewed the chart, notes and new data.  I agree with PA's note.  Key new complaints: pain is atypical, but persists Key examination changes: mild ankle edema, without clear signs of CHF by exam or CXR Key new findings / data: low BNP, normal ECG and enzymes  PLAN: The cause of her symptoms is not evident, cannot fully exclude CAD progression, but ECG and biomarkers low risk. If symptoms improve and enzymes/ECG remain normal, plan nuclear scan in AM. Otherwise, may need repeat angiography. Reevaluate echo - volume status is unclear (edema, but low BNP - due to obesity?).  Sanda Klein, MD, [place] [phone] 04/30/2015, 4:04 PM

## 2015-04-30 NOTE — ED Notes (Signed)
Pt continues to have chest pain, minimal change in pain with nitro SL, paging admitting MD to verify bed placement

## 2015-05-01 ENCOUNTER — Observation Stay (HOSPITAL_BASED_OUTPATIENT_CLINIC_OR_DEPARTMENT_OTHER): Payer: Commercial Managed Care - HMO

## 2015-05-01 ENCOUNTER — Ambulatory Visit: Payer: Commercial Managed Care - HMO | Admitting: Internal Medicine

## 2015-05-01 ENCOUNTER — Observation Stay (HOSPITAL_COMMUNITY): Payer: Commercial Managed Care - HMO

## 2015-05-01 DIAGNOSIS — I509 Heart failure, unspecified: Secondary | ICD-10-CM

## 2015-05-01 DIAGNOSIS — E1165 Type 2 diabetes mellitus with hyperglycemia: Secondary | ICD-10-CM | POA: Diagnosis not present

## 2015-05-01 DIAGNOSIS — E785 Hyperlipidemia, unspecified: Secondary | ICD-10-CM

## 2015-05-01 DIAGNOSIS — E1140 Type 2 diabetes mellitus with diabetic neuropathy, unspecified: Secondary | ICD-10-CM

## 2015-05-01 DIAGNOSIS — I429 Cardiomyopathy, unspecified: Secondary | ICD-10-CM | POA: Diagnosis not present

## 2015-05-01 DIAGNOSIS — I10 Essential (primary) hypertension: Secondary | ICD-10-CM | POA: Diagnosis not present

## 2015-05-01 DIAGNOSIS — G609 Hereditary and idiopathic neuropathy, unspecified: Secondary | ICD-10-CM | POA: Diagnosis not present

## 2015-05-01 DIAGNOSIS — I25119 Atherosclerotic heart disease of native coronary artery with unspecified angina pectoris: Secondary | ICD-10-CM | POA: Diagnosis not present

## 2015-05-01 DIAGNOSIS — E11319 Type 2 diabetes mellitus with unspecified diabetic retinopathy without macular edema: Secondary | ICD-10-CM | POA: Diagnosis not present

## 2015-05-01 DIAGNOSIS — R079 Chest pain, unspecified: Secondary | ICD-10-CM | POA: Diagnosis not present

## 2015-05-01 DIAGNOSIS — Z87891 Personal history of nicotine dependence: Secondary | ICD-10-CM | POA: Diagnosis not present

## 2015-05-01 DIAGNOSIS — I5022 Chronic systolic (congestive) heart failure: Secondary | ICD-10-CM | POA: Diagnosis not present

## 2015-05-01 DIAGNOSIS — R072 Precordial pain: Secondary | ICD-10-CM | POA: Diagnosis not present

## 2015-05-01 LAB — NM MYOCAR MULTI W/SPECT W/WALL MOTION / EF
CHL CUP RESTING HR STRESS: 80 {beats}/min
CSEPED: 0 min
CSEPEW: 1 METS
CSEPPHR: 96 {beats}/min
Exercise duration (sec): 0 s
MPHR: 164 {beats}/min
Percent HR: 58 %

## 2015-05-01 LAB — TROPONIN I: Troponin I: <0.03 ng/mL (ref <0.031)

## 2015-05-01 LAB — BASIC METABOLIC PANEL
Anion gap: 10 (ref 5–15)
BUN: 13 mg/dL (ref 6–20)
CALCIUM: 9.3 mg/dL (ref 8.9–10.3)
CO2: 26 mmol/L (ref 22–32)
CREATININE: 1.02 mg/dL — AB (ref 0.44–1.00)
Chloride: 100 mmol/L — ABNORMAL LOW (ref 101–111)
GFR calc Af Amer: >60 mL/min (ref >60)
GFR calc non Af Amer: >60 mL/min (ref >60)
GLUCOSE: 225 mg/dL — AB (ref 65–99)
Potassium: 4.5 mmol/L (ref 3.5–5.1)
Sodium: 136 mmol/L (ref 135–145)

## 2015-05-01 LAB — GLUCOSE, CAPILLARY
GLUCOSE-CAPILLARY: 246 mg/dL — AB (ref 65–99)
Glucose-Capillary: 213 mg/dL — ABNORMAL HIGH (ref 65–99)
Glucose-Capillary: 181 mg/dL — ABNORMAL HIGH (ref 65–99)

## 2015-05-01 LAB — LIPID PANEL
Cholesterol: 182 mg/dL (ref 0–200)
HDL: 50 mg/dL (ref >40)
LDL CALC: 89 mg/dL (ref 0–99)
Total CHOL/HDL Ratio: 3.6 RATIO
Triglycerides: 215 mg/dL — ABNORMAL HIGH (ref <150)
VLDL: 43 mg/dL — ABNORMAL HIGH (ref 0–40)

## 2015-05-01 MED ORDER — REGADENOSON 0.4 MG/5ML IV SOLN
0.4000 mg | Freq: Once | INTRAVENOUS | Status: AC
Start: 2015-05-01 — End: 2015-05-01
  Administered 2015-05-01: 0.4 mg via INTRAVENOUS
  Filled 2015-05-01: qty 5

## 2015-05-01 MED ORDER — PERFLUTREN LIPID MICROSPHERE
1.0000 mL | INTRAVENOUS | Status: AC | PRN
  Administered 2015-05-01: 4 mL via INTRAVENOUS
  Filled 2015-05-01: qty 10

## 2015-05-01 MED ORDER — TECHNETIUM TC 99M SESTAMIBI GENERIC - CARDIOLITE
10.0000 | Freq: Once | INTRAVENOUS | Status: AC | PRN
  Administered 2015-05-01: 10 via INTRAVENOUS

## 2015-05-01 MED ORDER — REGADENOSON 0.4 MG/5ML IV SOLN
INTRAVENOUS | Status: AC
  Filled 2015-05-01: qty 5

## 2015-05-01 MED ORDER — TECHNETIUM TC 99M SESTAMIBI GENERIC - CARDIOLITE
30.0000 | Freq: Once | INTRAVENOUS | Status: AC | PRN
  Administered 2015-05-01: 30 via INTRAVENOUS

## 2015-05-01 MED ORDER — ASPIRIN 81 MG PO TBEC: 81.0000 mg | DELAYED_RELEASE_TABLET | Freq: Every day | ORAL | Status: DC

## 2015-05-01 NOTE — Progress Notes (Signed)
IV removed per discharge order. Discharge instructions given and explained to patient with teach back.

## 2015-05-01 NOTE — Progress Notes (Signed)
  Echocardiogram 2D Echocardiogram has been performed.  [name] 05/01/2015, 9:38 AM

## 2015-05-01 NOTE — Progress Notes (Signed)
Patient Name: [name] Date of Encounter: 05/01/2015  Active Problems:   Uncontrolled type 2 diabetes with neuropathy ([place])   Hyperlipidemia   Essential hypertension   Chest pain    Primary Cardiologist: Dr. Burt Knack Patient Profile: 54 y.o. female w/ PMH of systolic CHF, NICM, HTN, HL, GERD, DM with retinopathy, chronic lower back pain and former smoker (quit 2014) who presented 11/3 to [facility] ED for evaluation of worsening intermittent CP.  SUBJECTIVE: Denies any recurrence in chest pain, palpitations, or shortness of breath overnight or this morning. Seen in nuc med for NST.   OBJECTIVE Filed Vitals:   05/01/15 1313 05/01/15 1315 05/01/15 1317 05/01/15 1318  BP: 136/51 142/66 138/65   Pulse: 93 91 89 88  Temp:      TempSrc:      Resp:      Height:      Weight:      SpO2:        Intake/Output Summary (Last 24 hours) at 05/01/15 1321 Last data filed at 05/01/15 0824  Gross per 24 hour  Intake    470 ml  Output   2300 ml  Net  -1830 ml   Filed Weights   04/30/15 1139 04/30/15 1858 05/01/15 0538  Weight: 240 lb (108.863 kg) 256 lb 12.8 oz (116.484 kg) 257 lb 13.4 oz (116.955 kg)    PHYSICAL EXAM General: Obese African American, female in no acute distress. Head: Normocephalic, atraumatic.  Neck: Supple without bruits, JVD not elevated. Lungs:  Resp regular and unlabored, CTA without wheezing or rales. Heart: RRR, S1, S2, no S3, S4, or murmur; no rub. Abdomen: Soft, non-tender, non-distended with normoactive bowel sounds. No hepatomegaly. No rebound/guarding. No obvious abdominal masses. Extremities: No clubbing, cyanosis, or edema. Distal pedal pulses are 2+ bilaterally. Neuro: Alert and oriented X 3. Moves all extremities spontaneously. Psych: Normal affect.  LABS: CBC:  Recent Labs  04/30/15 1152  WBC 10.8*  HGB 11.8*  HCT 36.9  MCV 87.2  PLT 254   Basic Metabolic Panel:  Recent Labs  04/30/15 1152 05/01/15 0820  NA 138 136  K 4.2 4.5   CL 103 100*  CO2 27 26  GLUCOSE 73 225*  BUN 13 13  CREATININE 0.90 1.02*  CALCIUM 9.4 9.3   Cardiac Enzymes:  Recent Labs  04/30/15 2027 05/01/15 0109 05/01/15 0820  TROPONINI <0.03 <0.03 <0.03    Recent Labs  04/30/15 1156 04/30/15 1634  TROPIPOC 0.00 0.00   BNP:  B NATRIURETIC PEPTIDE  Date/Time Value Ref Range Status  04/30/2015 12:16 PM 29.3 0.0 - 100.0 pg/mL Final  10/23/2014 05:58 PM 16.0 0.0 - 100.0 pg/mL Final   Fasting Lipid Panel:  Recent Labs  05/01/15 0109  CHOL 182  HDL 50  LDLCALC 89  TRIG 215*  CHOLHDL 3.6   Thyroid Function Tests:  Recent Labs  04/30/15 2027  TSH 3.377   TELE:  NSR without atopic events.  ECHO: Conclusions - Left ventricle: The cavity size was normal. Wall thickness was increased in a pattern of mild LVH. Systolic function was normal. The estimated ejection fraction was in the range of 55% to 60%. Wall motion was normal; there were no regional wall motion abnormalities. Doppler parameters are consistent with abnormal left ventricular relaxation (grade 1 diastolic dysfunction).  Radiology/Studies: Dg Chest 2 View: 04/30/2015  CLINICAL DATA:  Chest pain EXAM: CHEST  2 VIEW COMPARISON:  03/31/2015 FINDINGS: The heart size and mediastinal contours are within normal  limits. Both lungs are clear. The visualized skeletal structures are unremarkable. IMPRESSION: No active cardiopulmonary disease. Electronically Signed   By: Kerby Moors M.D.   On: 04/30/2015 13:10    Current Medications:  . amitriptyline  25 mg Oral QHS  . aspirin EC  81 mg Oral Daily  . carvedilol  6.25 mg Oral BID WC  . dorzolamide-timolol  1 drop Right Eye BID  . ferrous sulfate  325 mg Oral Q breakfast  . furosemide  40 mg Oral Daily  . gabapentin  300 mg Oral TID  . lisinopril  10 mg Oral Daily  . pantoprazole  40 mg Oral Daily  . pravastatin  20 mg Oral Daily  . regadenoson          ASSESSMENT AND PLAN:  1. Chest pain - has  both typical and atypical. Intermittent worsening substernal sharp chest pain. Last episode last night that was radiated to her left shoulder. Pain is reproducible with palpation.  - cyclic troponin values have been negative. - NST performed this afternoon. [facility] reading. Official results pending.  2. Chronic Systolic CHF - BNP 29. Chest x-ray without acute cardiopulmonary disease.  - Lungs clear to auscultation. However has trace to 1+ lower approximately edema. She takes Lasix 20 mg daily at home. - Echocardiogram 7/13: limited study, EF 35-40%.  - Echo showing EF of 55-60% with Grade 1 Diastolic Dysfunction - Continue Lasix PO 40mg  daily.  3. CAD - LHC 7/13 showed Mid LAD 50% within a myocardial bridge, EF 40-45%.  - continue coreg 6.25mg  BID, lisinopril 10mg  qd, ASA and pravastatin 20mg .  4. Hypertension - BP has been 99/41 - 162/75 in the past 24 hours. - continue current medical regimen.  5. Hyperlipidemia - Continue statin therapy  6. DM with retinopathy - HgbA1c 8.1 - 02/19/15  Signed, Erma Heritage , PA-C 1:21 PM 05/01/2015 Pager: [phone]  I have seen and examined the patient along with Erma Heritage , PA-C.  I have reviewed the chart, notes and new data.  I agree with PA's note.  Key new complaints: chest complaints have improved Key examination changes: no arrhythmia overnight, no overt HF Key new findings / data: normal ecg and labs; normal LVEF and wall motion on echo  PLAN: DC home if nuclear scan is low risk  Sanda Klein, MD, [name] [phone] 05/01/2015, 1:57 PM

## 2015-05-01 NOTE — Discharge Summary (Signed)
CARDIOLOGY DISCHARGE SUMMARY   Patient ID: [name] MRN: [id number] DOB/AGE: 1961/05/06 54 y.o.  Admit date: 04/30/2015 Discharge date: 05/01/2015  PCP: Hinton Lovely, MD Primary Cardiologist: Dr. Burt Knack  Primary Discharge Diagnosis:  Chest Pain Secondary Discharge Diagnosis: Uncontrolled type 2 diabetes with neuropathy ([place]), Hyperlipidemia, Essential hypertension  Consults: None  Procedures: Nuclear Stress Test, Transthoracic Echocardiogram  Hospital Course: [name] is a 54 y.o. female with past medical history of systolic CHF, NICM, HTN, HLD, GERD, Type 2 DM, and former tobacco abuse who presented to [facility] ED on 04/30/2015 for intermittent chest pain over the past 3-4 weeks.   Her initial troponin in the ED was negative. BNP was normal at 29. Her EKG showed NSR with no ST or T wave changes. On examination, she was tender to palpation along her chest. She did have some lower extremity edema, so one dose of Lasix 40mg  IV was administered. A Myoview stress test was planned for the following morning to further evaluate her chest pain.   She denied any chest pain, palpitations, or shortness of breath overnight. Cyclic troponin values remained negative. Her nuclear stress test was performed without complications and did not show any evidence of inducible ischemia. The stress test was determined to be low-risk.  The patient was last examined by Dr. Sallyanne Kuster and deemed stable for discharge. No medication changes were made this admission. Cardiology follow-up has been scheduled for 05/29/2015.  Labs:   Lab Results  Component Value Date   WBC 10.8* 04/30/2015   HGB 11.8* 04/30/2015   HCT 36.9 04/30/2015   MCV 87.2 04/30/2015   PLT 280 04/30/2015     Recent Labs Lab 05/01/15 0820  NA 136  K 4.5  CL 100*  CO2 26  BUN 13  CREATININE 1.02*  CALCIUM 9.3  GLUCOSE 225*    Recent Labs  04/30/15 2027 05/01/15 0109 05/01/15 0820  TROPONINI <0.03 <0.03  <0.03   Lipid Panel     Component Value Date/Time   CHOL 182 05/01/2015 0109   TRIG 215* 05/01/2015 0109   HDL 50 05/01/2015 0109   CHOLHDL 3.6 05/01/2015 0109   VLDL 43* 05/01/2015 0109   LDLCALC 89 05/01/2015 0109    B NATRIURETIC PEPTIDE  Date/Time Value Ref Range Status  04/30/2015 12:16 PM 29.3 0.0 - 100.0 pg/mL Final  10/23/2014 05:58 PM 16.0 0.0 - 100.0 pg/mL Final   Radiology:  Dg Chest 2 View: 04/30/2015  CLINICAL DATA:  Chest pain EXAM: CHEST  2 VIEW COMPARISON:  03/31/2015 FINDINGS: The heart size and mediastinal contours are within normal limits. Both lungs are clear. The visualized skeletal structures are unremarkable. IMPRESSION: No active cardiopulmonary disease. Electronically Signed   By: Kerby Moors M.D.   On: 04/30/2015 13:10    Nuclear Stress Test: 05/01/2015 IMPRESSION: 1. No evidence of inducible myocardial ischemia. Inferior wall attenuation likely relates to diaphragmatic attenuation. Apical attenuation may relate to apical thinning or breast soft tissue attenuation.  2. Normal left ventricular wall motion.  3. Left ventricular ejection fraction 53%  4. Low-risk stress test findings*.   Echo: 05/01/2015 Study Conclusions - Left ventricle: The cavity size was normal. Wall thickness was increased in a pattern of mild LVH. Systolic function was normal. The estimated ejection fraction was in the range of 55% to 60%. Wall motion was normal; there were no regional wall motion abnormalities. Doppler parameters are consistent with abnormal left ventricular relaxation (grade 1 diastolic dysfunction).  FOLLOW  UP PLANS AND APPOINTMENTS No Known Allergies   Medication List    TAKE these medications        amitriptyline 25 MG tablet  Commonly known as:  ELAVIL  Take 1 tablet (25 mg total) by mouth at bedtime.     aspirin 81 MG EC tablet  Take 1 tablet (81 mg total) by mouth daily.     carvedilol 6.25 MG tablet  Commonly known as:   COREG  Take 1 tablet (6.25 mg total) by mouth 2 (two) times daily.     cyclobenzaprine 5 MG tablet  Commonly known as:  FLEXERIL  Take 1 tablet (5 mg total) by mouth at bedtime.     dorzolamide-timolol 22.3-6.8 MG/ML ophthalmic solution  Commonly known as:  COSOPT  Place 1 drop into the right eye 2 (two) times daily.     ferrous sulfate 325 (65 FE) MG tablet  Take 1 tablet (325 mg total) by mouth daily with breakfast.     furosemide 40 MG tablet  Commonly known as:  LASIX  Take 0.5 tablets (20 mg total) by mouth daily.     gabapentin 300 MG capsule  Commonly known as:  NEURONTIN  TAKE 1 CAPSULE THREE TIMES DAILY     glucose blood test strip  Commonly known as:  ONETOUCH VERIO  Check blood sugar as instructed up to 3 times a day     guaiFENesin-dextromethorphan 100-10 MG/5ML syrup  Commonly known as:  ROBITUSSIN DM  Take 5 mLs by mouth every 4 (four) hours as needed for cough.     HYDROcodone-acetaminophen 5-325 MG tablet  Commonly known as:  NORCO/VICODIN  Take 1 tablet by mouth every 6 (six) hours as needed for moderate pain (pain). Do not pick up before May 08, 2015.  Start taking on:  05/08/2015     insulin aspart 100 UNIT/ML FlexPen  Commonly known as:  NOVOLOG FLEXPEN  INJECT 4-6 UNITS INTO THE SKIN THREE TIMES DAILY BEFORE A MEAL.*(4 UNITS IN THE AM AND 6 UNITS IN THE PM)     Insulin Glargine 100 UNIT/ML Solostar Pen  Commonly known as:  LANTUS SOLOSTAR  Inject 44 Units into the skin at bedtime.     Insulin Pen Needle 31G X 5 MM Misc  Commonly known as:  B-D UF III MINI PEN NEEDLES  Use to inject insulin 4-5 times daily     lisinopril 20 MG tablet  Commonly known as:  PRINIVIL,ZESTRIL  Take 0.5 tablets (10 mg total) by mouth daily.     metFORMIN 1000 MG tablet  Commonly known as:  GLUCOPHAGE  TAKE 1 TABLET BY MOUTH TWICE DAILY WITH MEALS     ondansetron 4 MG tablet  Commonly known as:  ZOFRAN  Take 1 tablet (4 mg total) by mouth every 8 (eight)  hours as needed for nausea or vomiting.     ONETOUCH DELICA LANCETS FINE Misc  Check blood sugar as instructed up to 3 times a day     pantoprazole 40 MG tablet  Commonly known as:  PROTONIX  Take 1 tablet (40 mg total) by mouth daily.     pravastatin 20 MG tablet  Commonly known as:  PRAVACHOL  Take 1 tablet (20 mg total) by mouth daily.     triamcinolone cream 0.1 %  Commonly known as:  KENALOG  Apply 1 application topically 2 (two) times daily.         Follow-up Information    Follow up with SIMMONS, BRITTAINY,  PA-C On 05/29/2015.   Specialties:  Cardiology, Radiology   Why:  Cardiology Follow-Up 05/29/2015 at 11:00AM.   Contact information:   [place] [address] [phone]       BRING ALL MEDICATIONS WITH YOU TO FOLLOW UP APPOINTMENTS  Time spent with patient to include physician time: 35 minutes Signed: Erma Heritage, PA 05/01/2015, 5:16 PM Co-Sign MD

## 2015-05-01 NOTE — Progress Notes (Signed)
Inpatient Diabetes Program Recommendations  AACE/ADA: New Consensus Statement on Inpatient Glycemic Control (2015)  Target Ranges:  Prepandial:   less than 140 mg/dL      Peak postprandial:   less than 180 mg/dL (1-2 hours)      Critically ill patients:  140 - 180 mg/dL   Review of Glycemic Control  Diabetes history: Type 2, 02/19/15 A1C 8.1%  Outpatient Diabetes medications: Lantus 44 units qhs, Glucophage 1000mg  bid, Novolog 3-5 units tid with meals Current orders for Inpatient glycemic control: none  Inpatient Diabetes Program Recommendations: Please consider starting half home dose of Lantus 22 units qhs and Novolog correction insulin 0-9 units tid with meals   Spoke to [place], PA-C by phone.  Patient is likely going home this afternoon but she (PA) will address these diabetes concerns if the patient remains over night.  Gentry Fitz, RN, BA, MHA, CDE Diabetes Coordinator Inpatient Diabetes Program  [phone] (Team Pager) [phone] ([place]) 05/01/2015 2:47 PM

## 2015-05-02 DIAGNOSIS — G609 Hereditary and idiopathic neuropathy, unspecified: Secondary | ICD-10-CM | POA: Diagnosis not present

## 2015-05-04 ENCOUNTER — Telehealth: Payer: Self-pay | Admitting: Cardiovascular Disease

## 2015-05-04 NOTE — Telephone Encounter (Signed)
Patient contacted regarding discharge from [facility] on 05/01/15.  Patient understands to follow up with provider Ellen Henri on 05/29/15 at 35 at [facility] . Patient understands discharge instructions? yes  Patient understands medications and regiment? yes Patient understands to bring all medications to this visit? yes

## 2015-05-04 NOTE — Telephone Encounter (Signed)
D/C phone call . Appt on 05/29/15 at 11am w/ [place] office .Marland Kitchen   Thanks

## 2015-05-07 ENCOUNTER — Ambulatory Visit (INDEPENDENT_AMBULATORY_CARE_PROVIDER_SITE_OTHER): Payer: Commercial Managed Care - HMO | Admitting: Internal Medicine

## 2015-05-07 ENCOUNTER — Encounter: Payer: Self-pay | Admitting: Internal Medicine

## 2015-05-07 VITALS — BP 145/73 | HR 89 | Temp 97.6°F | Wt 256.1 lb

## 2015-05-07 DIAGNOSIS — B351 Tinea unguium: Secondary | ICD-10-CM | POA: Diagnosis not present

## 2015-05-07 DIAGNOSIS — I10 Essential (primary) hypertension: Secondary | ICD-10-CM | POA: Diagnosis not present

## 2015-05-07 MED ORDER — TERBINAFINE HCL 250 MG PO TABS: 250.0000 mg | ORAL_TABLET | Freq: Every day | ORAL | Status: DC

## 2015-05-07 NOTE — Assessment & Plan Note (Signed)
BP Readings from Last 3 Encounters:  05/07/15 145/73  05/01/15 138/65  04/07/15 124/59   BP is stable, Continue current regimen of coreg, lasix and lisinopril Pt has appt next week with Dr Benjamine Mola

## 2015-05-07 NOTE — Patient Instructions (Addendum)
Please take the terbinafine medicine for your toenail for 12 weeks- Please call if you are not able to tolerate the medicine or experience unusual side effects like nausea or vomiting Please follow up with the podiatrist  Please follow up here in 1 week with Dr Benjamine Mola

## 2015-05-07 NOTE — Progress Notes (Signed)
Patient ID: [name], female   DOB: [birth year], 54 y.o.   MRN: [id number]    Subjective:   Patient ID: [name] female   DOB: Jul 15, 1960 54 y.o.   MRN: [id number]  HPI: [name] is a 54 y.o. woman with PMH noted below here to discuss toenail infection.  Please see Problem List/A&P for the status of the patient's chronic medical problems      Past Medical History  Diagnosis Date  . Hypertension   . Hyperlipidemia   . GERD (gastroesophageal reflux disease)   . Adhesive capsulitis of shoulder     bilateral, Steroid injection Dr. Truman Hayward 1/12 bilaterally  . Peripheral neuropathy ([place])   . Adhesive capsulitis of right shoulder     Steroid injection Dr. Truman Hayward 1/12  . Adhesive capsulitis of left shoulder     Steroid injection Dr. Truman Hayward 1/12  . Obesity   . Diabetic retinopathy   . Glaucoma   . Diabetes mellitus     Type II, insulin dependent  . CAD (coronary artery disease)     nonobstructive. Last cardiac cath (2008) showing left circumflex with mid 50% stenosis and distal luminal irregularities. Also with RCA with mid to distal 30-40% stenosis. // Previously evaluated by [name], never followed up outpatient.  . CHF (congestive heart failure) ([place])   . CAP (community acquired pneumonia) 06/15/2012    05/2012 CXR: Mild opacification of the posterior lung base on the lateral film  as cannot exclude infection/atelectasis.     Current Outpatient Prescriptions  Medication Sig Dispense Refill  . amitriptyline (ELAVIL) 25 MG tablet Take 1 tablet (25 mg total) by mouth at bedtime. 90 tablet 0  . aspirin EC 81 MG EC tablet Take 1 tablet (81 mg total) by mouth daily.    . carvedilol (COREG) 6.25 MG tablet Take 1 tablet (6.25 mg total) by mouth 2 (two) times daily. 60 tablet 11  . cyclobenzaprine (FLEXERIL) 5 MG tablet Take 1 tablet (5 mg total) by mouth at bedtime. 270 tablet 1  . dorzolamide-timolol (COSOPT) 22.3-6.8 MG/ML ophthalmic solution Place 1 drop into  the right eye 2 (two) times daily.    . ferrous sulfate 325 (65 FE) MG tablet Take 1 tablet (325 mg total) by mouth daily with breakfast. 30 tablet 11  . furosemide (LASIX) 40 MG tablet Take 0.5 tablets (20 mg total) by mouth daily. 30 tablet 3  . gabapentin (NEURONTIN) 300 MG capsule TAKE 1 CAPSULE THREE TIMES DAILY 270 capsule 0  . glucose blood (ONETOUCH VERIO) test strip Check blood sugar as instructed up to 3 times a day 100 each 12  . guaiFENesin-dextromethorphan (ROBITUSSIN DM) 100-10 MG/5ML syrup Take 5 mLs by mouth every 4 (four) hours as needed for cough. 118 mL 0  . [START ON 05/08/2015] HYDROcodone-acetaminophen (NORCO/VICODIN) 5-325 MG tablet Take 1 tablet by mouth every 6 (six) hours as needed for moderate pain (pain). Do not pick up before May 08, 2015. 90 tablet 0  . insulin aspart (NOVOLOG FLEXPEN) 100 UNIT/ML FlexPen INJECT 4-6 UNITS INTO THE SKIN THREE TIMES DAILY BEFORE A MEAL.*(4 UNITS IN THE AM AND 6 UNITS IN THE PM) (Patient taking differently: INJECT 3-5 UNITS INTO THE SKIN THREE TIMES DAILY BEFORE A MEAL.*(4 UNITS IN THE AM AND 6 UNITS IN THE PM)) 15 mL 3  . Insulin Glargine (LANTUS SOLOSTAR) 100 UNIT/ML Solostar Pen Inject 44 Units into the skin at bedtime. 30 mL 6  . Insulin Pen Needle (B-D UF  III MINI PEN NEEDLES) 31G X 5 MM MISC Use to inject insulin 4-5 times daily 200 each 5  . lisinopril (PRINIVIL,ZESTRIL) 20 MG tablet Take 0.5 tablets (10 mg total) by mouth daily. 90 tablet 3  . metFORMIN (GLUCOPHAGE) 1000 MG tablet TAKE 1 TABLET BY MOUTH TWICE DAILY WITH MEALS 180 tablet 0  . ondansetron (ZOFRAN) 4 MG tablet Take 1 tablet (4 mg total) by mouth every 8 (eight) hours as needed for nausea or vomiting. 30 tablet 0  . ONETOUCH DELICA LANCETS FINE MISC Check blood sugar as instructed up to 3 times a day (Patient not taking: Reported on 04/30/2015) 100 each 12  . pantoprazole (PROTONIX) 40 MG tablet Take 1 tablet (40 mg total) by mouth daily. 90 tablet 3  . permethrin  (ELIMITE) 5 % cream   0  . pravastatin (PRAVACHOL) 20 MG tablet Take 1 tablet (20 mg total) by mouth daily. 30 tablet 3  . terbinafine (LAMISIL) 250 MG tablet Take 1 tablet (250 mg total) by mouth daily. For 3 months 120 tablet 0  . triamcinolone cream (KENALOG) 0.1 % Apply 1 application topically 2 (two) times daily. 30 g 0   No current facility-administered medications for this visit.   Family History  Problem Relation Age of Onset  . Hypertension Sister   . Hypertension Sister   . Hypertension Sister    Social History   Social History  . Marital Status: Single    Spouse Name: N/A  . Number of Children: 2  . Years of Education: 12th grade   Occupational History  . 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    Social History Main Topics  . Smoking status: Former Smoker -- 0.30 packs/day for 7 years    Types: Cigarettes    Quit date: 06/27/2006  . Smokeless tobacco: Never Used  . Alcohol Use: No  . Drug Use: No  . Sexual Activity: Not Currently   Other Topics Concern  . None   Social History Narrative   Lives with her kids, 61yo daughter and 61 yo adopted son.   Review of Systems: Review of Systems  Constitutional: Negative.  Negative for fever, chills, weight loss and malaise/fatigue.  Respiratory: Negative.   Cardiovascular: Negative.   Gastrointestinal: Negative.   Skin: Negative for rash.       Toenail infection    Objective:  Physical Exam: Filed Vitals:   05/07/15 0931  BP: 145/73  Pulse: 89  Temp: 97.6 F (36.4 C)  TempSrc: Oral  Weight: 256 lb 1.6 oz (116.166 kg)  SpO2: 98%   Physical Exam  Constitutional: She appears well-developed and well-nourished.  HENT:  Head: Normocephalic and atraumatic.  Cardiovascular: Normal rate, regular rhythm, normal heart sounds and intact distal pulses.   No murmur heard. Pulmonary/Chest: Effort normal and breath sounds normal. No respiratory  distress.  Skin:  Bilateral toenail onychomycosis, and also of other digit nails No lesions on the feet Peripheral pulses were 2+ and intact  Normal sensation in both feet     Assessment & Plan:   Please see problem based charting for assessment and plan

## 2015-05-07 NOTE — Assessment & Plan Note (Addendum)
Pt says that she has infection of the toenail since she had them removed at [facility] in May.  She denies any systemic symptoms, or erythema or drainage from the toenail. She has chronic diabetes with last A1c of 8.1 checked few months back. She also has HTN and obesity. Exam reveals severe onychomycosis of both toenails and other digit nails of the feet. Pulses are intact. Prior labs were reviewed and Her LFTs were last checked in April and they were normal. Her creatinine is 1.02. Her meds were reviewed and there does not appear to be significant interactions of terbinafine with her existing meds  Plan Prescribed terbinafine 250 mg for 12 weeks Referred to podiatry Counseled on proper foot care and the long term implications as pt has uncontrolled diabetes

## 2015-05-08 NOTE — Progress Notes (Signed)
Internal Medicine Clinic Attending  I saw and evaluated the patient.  I personally confirmed the key portions of the history and exam documented by Dr. Saraiya and I reviewed pertinent patient test results.  The assessment, diagnosis, and plan were formulated together and I agree with the documentation in the resident's note.  

## 2015-05-10 DIAGNOSIS — G609 Hereditary and idiopathic neuropathy, unspecified: Secondary | ICD-10-CM | POA: Diagnosis not present

## 2015-05-11 DIAGNOSIS — G609 Hereditary and idiopathic neuropathy, unspecified: Secondary | ICD-10-CM | POA: Diagnosis not present

## 2015-05-12 DIAGNOSIS — G609 Hereditary and idiopathic neuropathy, unspecified: Secondary | ICD-10-CM | POA: Diagnosis not present

## 2015-05-12 DIAGNOSIS — Z961 Presence of intraocular lens: Secondary | ICD-10-CM | POA: Diagnosis not present

## 2015-05-12 DIAGNOSIS — H401133 Primary open-angle glaucoma, bilateral, severe stage: Secondary | ICD-10-CM | POA: Diagnosis not present

## 2015-05-13 DIAGNOSIS — G609 Hereditary and idiopathic neuropathy, unspecified: Secondary | ICD-10-CM | POA: Diagnosis not present

## 2015-05-14 DIAGNOSIS — G609 Hereditary and idiopathic neuropathy, unspecified: Secondary | ICD-10-CM | POA: Diagnosis not present

## 2015-05-15 ENCOUNTER — Other Ambulatory Visit: Payer: Self-pay | Admitting: Internal Medicine

## 2015-05-15 ENCOUNTER — Encounter: Payer: Self-pay | Admitting: Internal Medicine

## 2015-05-15 ENCOUNTER — Encounter: Payer: Commercial Managed Care - HMO | Admitting: Internal Medicine

## 2015-05-15 DIAGNOSIS — G609 Hereditary and idiopathic neuropathy, unspecified: Secondary | ICD-10-CM | POA: Diagnosis not present

## 2015-05-16 DIAGNOSIS — G609 Hereditary and idiopathic neuropathy, unspecified: Secondary | ICD-10-CM | POA: Diagnosis not present

## 2015-05-17 DIAGNOSIS — G609 Hereditary and idiopathic neuropathy, unspecified: Secondary | ICD-10-CM | POA: Diagnosis not present

## 2015-05-18 DIAGNOSIS — G609 Hereditary and idiopathic neuropathy, unspecified: Secondary | ICD-10-CM | POA: Diagnosis not present

## 2015-05-19 ENCOUNTER — Encounter: Payer: Self-pay | Admitting: Student

## 2015-05-19 DIAGNOSIS — G609 Hereditary and idiopathic neuropathy, unspecified: Secondary | ICD-10-CM | POA: Diagnosis not present

## 2015-05-20 DIAGNOSIS — G609 Hereditary and idiopathic neuropathy, unspecified: Secondary | ICD-10-CM | POA: Diagnosis not present

## 2015-05-21 DIAGNOSIS — G609 Hereditary and idiopathic neuropathy, unspecified: Secondary | ICD-10-CM | POA: Diagnosis not present

## 2015-05-22 DIAGNOSIS — G609 Hereditary and idiopathic neuropathy, unspecified: Secondary | ICD-10-CM | POA: Diagnosis not present

## 2015-05-23 DIAGNOSIS — G609 Hereditary and idiopathic neuropathy, unspecified: Secondary | ICD-10-CM | POA: Diagnosis not present

## 2015-05-24 DIAGNOSIS — G609 Hereditary and idiopathic neuropathy, unspecified: Secondary | ICD-10-CM | POA: Diagnosis not present

## 2015-05-25 DIAGNOSIS — L600 Ingrowing nail: Secondary | ICD-10-CM | POA: Diagnosis not present

## 2015-05-25 DIAGNOSIS — G609 Hereditary and idiopathic neuropathy, unspecified: Secondary | ICD-10-CM | POA: Diagnosis not present

## 2015-05-26 DIAGNOSIS — G609 Hereditary and idiopathic neuropathy, unspecified: Secondary | ICD-10-CM | POA: Diagnosis not present

## 2015-05-27 DIAGNOSIS — H401133 Primary open-angle glaucoma, bilateral, severe stage: Secondary | ICD-10-CM | POA: Diagnosis not present

## 2015-05-27 DIAGNOSIS — G609 Hereditary and idiopathic neuropathy, unspecified: Secondary | ICD-10-CM | POA: Diagnosis not present

## 2015-05-28 DIAGNOSIS — G609 Hereditary and idiopathic neuropathy, unspecified: Secondary | ICD-10-CM | POA: Diagnosis not present

## 2015-05-29 ENCOUNTER — Ambulatory Visit (INDEPENDENT_AMBULATORY_CARE_PROVIDER_SITE_OTHER): Payer: Commercial Managed Care - HMO | Admitting: Cardiology

## 2015-05-29 ENCOUNTER — Encounter: Payer: Self-pay | Admitting: Cardiology

## 2015-05-29 VITALS — BP 110/60 | HR 96 | Ht 66.0 in | Wt 256.0 lb

## 2015-05-29 DIAGNOSIS — G609 Hereditary and idiopathic neuropathy, unspecified: Secondary | ICD-10-CM | POA: Diagnosis not present

## 2015-05-29 DIAGNOSIS — I5022 Chronic systolic (congestive) heart failure: Secondary | ICD-10-CM

## 2015-05-29 NOTE — Progress Notes (Signed)
05/29/2015 [name]   1960-09-26  [id number]  Primary Physician Hinton Lovely, MD Primary Cardiologist: Dr. Burt Knack   Reason for Visit/CC:  Post hospital follow-up for chest pain  HPI:  The patient is a 54 year old female, who presents to clinic today for post hospital follow-up after recent admission for chest pain evaluation. Her past medical history is significant for systolic CHF, nonischemic cardiomyopathy, hypertension, hyperlipidemia,GERD and type 2 diabetes. She is also former smoker.   She presented to [facility] 04/30/2015 with complaint of intermittent chest pain over a 3-4 week period. She was admitted for evaluation. She ruled out for MI with negative cardiac enzymes. BNP was normal at 89. Her EKG showed normal sinus rhythm with no ST or T wave changes. She underwent a nuclear stress test that was low risk for ischemia. 2D echo showed normal LVEF of 55-60% with normal wall motion and G1DD. EF was improved compared to prior study in 2013 when EF was 35-40%.  It was felt her symptoms were noncardiac in nature. She was discharged home and instructed to continue medical therapy.  Today in follow-up she reports that she has done fairly well since her hospitalization. She denies any recurrent CP. No dyspnea or edema. She reports full medication compliance.    Current Outpatient Prescriptions  Medication Sig Dispense Refill  . amitriptyline (ELAVIL) 25 MG tablet Take 1 tablet (25 mg total) by mouth at bedtime. 90 tablet 0  . aspirin EC 81 MG EC tablet Take 1 tablet (81 mg total) by mouth daily.    . carvedilol (COREG) 6.25 MG tablet Take 1 tablet (6.25 mg total) by mouth 2 (two) times daily. 60 tablet 11  . cyclobenzaprine (FLEXERIL) 5 MG tablet Take 1 tablet (5 mg total) by mouth at bedtime. 270 tablet 1  . dorzolamide-timolol (COSOPT) 22.3-6.8 MG/ML ophthalmic solution Place 1 drop into the right eye 2 (two) times daily.    . ferrous sulfate 325 (65 FE) MG tablet Take  1 tablet (325 mg total) by mouth daily with breakfast. 30 tablet 11  . fluorometholone (FML) 0.1 % ophthalmic suspension Place 1 drop into both eyes 2 (two) times daily.     . furosemide (LASIX) 40 MG tablet Take 0.5 tablets (20 mg total) by mouth daily. 30 tablet 3  . gabapentin (NEURONTIN) 300 MG capsule Take 300 mg by mouth 2 (two) times daily.    Marland Kitchen glucose blood (ONETOUCH VERIO) test strip Check blood sugar as instructed up to 3 times a day 100 each 12  . HYDROcodone-acetaminophen (NORCO/VICODIN) 5-325 MG tablet Take 1 tablet by mouth every 6 (six) hours as needed for moderate pain (pain). Do not pick up before May 08, 2015. 90 tablet 0  . insulin aspart (NOVOLOG FLEXPEN) 100 UNIT/ML FlexPen INJECT 4-6 UNITS INTO THE SKIN THREE TIMES DAILY BEFORE A MEAL.*(4 UNITS IN THE AM AND 6 UNITS IN THE PM) (Patient taking differently: INJECT 3-5 UNITS INTO THE SKIN THREE TIMES DAILY BEFORE A MEAL.*(4 UNITS IN THE AM AND 6 UNITS IN THE PM)) 15 mL 3  . Insulin Glargine (LANTUS SOLOSTAR) 100 UNIT/ML Solostar Pen Inject 44 Units into the skin at bedtime. 30 mL 6  . Insulin Pen Needle (B-D UF III MINI PEN NEEDLES) 31G X 5 MM MISC Use to inject insulin 4-5 times daily 200 each 5  . latanoprost (XALATAN) 0.005 % ophthalmic solution Place 1 drop into both eyes daily.  4  . lisinopril (PRINIVIL,ZESTRIL) 20 MG tablet Take  0.5 tablets (10 mg total) by mouth daily. 90 tablet 3  . metFORMIN (GLUCOPHAGE) 1000 MG tablet TAKE 1 TABLET BY MOUTH TWICE DAILY WITH MEALS 180 tablet 0  . ondansetron (ZOFRAN) 4 MG tablet Take 1 tablet (4 mg total) by mouth every 8 (eight) hours as needed for nausea or vomiting. 30 tablet 0  . ONETOUCH DELICA LANCETS FINE MISC Check blood sugar as instructed up to 3 times a day 100 each 12  . pantoprazole (PROTONIX) 40 MG tablet Take 1 tablet (40 mg total) by mouth daily. 90 tablet 3  . permethrin (ELIMITE) 5 % cream   0  . pravastatin (PRAVACHOL) 20 MG tablet Take 1 tablet (20 mg total) by  mouth daily. 30 tablet 3  . terbinafine (LAMISIL) 250 MG tablet Take 1 tablet (250 mg total) by mouth daily. For 3 months 120 tablet 0  . triamcinolone cream (KENALOG) 0.1 % Apply 1 application topically 2 (two) times daily. 30 g 0   No current facility-administered medications for this visit.    No Known Allergies  Social History   Social History  . Marital Status: Single    Spouse Name: N/A  . Number of Children: 2  . Years of Education: 12th grade   Occupational History  . 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    Social History Main Topics  . Smoking status: Former Smoker -- 0.30 packs/day for 7 years    Types: Cigarettes    Quit date: 06/27/2006  . Smokeless tobacco: Never Used  . Alcohol Use: No  . Drug Use: No  . Sexual Activity: Not Currently   Other Topics Concern  . Not on file   Social History Narrative   Lives with her kids, 15yo daughter and 51 yo adopted son.     Review of Systems: General: negative for chills, fever, night sweats or weight changes.  Cardiovascular: negative for chest pain, dyspnea on exertion, edema, orthopnea, palpitations, paroxysmal nocturnal dyspnea or shortness of breath Dermatological: negative for rash Respiratory: negative for cough or wheezing Urologic: negative for hematuria Abdominal: negative for nausea, vomiting, diarrhea, bright red blood per rectum, melena, or hematemesis Neurologic: negative for visual changes, syncope, or dizziness All other systems reviewed and are otherwise negative except as noted above.    Blood pressure 110/60, pulse 96, height 5\' 6"  (1.676 m), weight 256 lb (116.121 kg), last menstrual period 02/13/2011.  General appearance: alert, cooperative and no distress Neck: no carotid bruit and no JVD Lungs: clear to auscultation bilaterally Heart: regular rate and rhythm, S1, S2 normal, no murmur, click, rub or gallop Extremities: no  LEE Pulses: 2+ and symmetric Skin: warm and dry Neurologic: Grossly normal  EKG NSR. No ischemia  ASSESSMENT AND PLAN:    1. Chest pain - recent w/u negative for ischemia. Normal cardiac enzymes. Low risk NST and normal 2D echo. No recurrent symptoms.   2. H/o Systolic CHF - Recent echo showed improvement in EF up to 55-60% (previously 35-40% in 2013).  - She does have G1DD but volume is stable.  Continue Lasix PO 40mg  daily.  3. CAD - LHC 7/13 showed Mid LAD 50% within a myocardial bridge. EF now 55-60% - low risk NST 04/2015.  - continue coreg 6.25mg  BID, lisinopril 10mg  qd, ASA and pravastatin 20mg .  4. Hypertension - Well controlled today at 110/60. - continue current medical regimen.  5. Hyperlipidemia - Continue statin therapy  6. DM with retinopathy - HgbA1c 8.1 - 02/19/15. Goal is <7.0 - management per PCP.   PLAN  F/u with Dr. Burt Knack in 1 year   Carrie Jester PA-C 05/29/2015 11:48 AM

## 2015-05-29 NOTE — Patient Instructions (Signed)
Medication Instructions:  Your physician recommends that you continue on your current medications as directed. Please refer to the Current Medication list given to you today.  Labwork: NONE  Testing/Procedures: NONE  Follow-Up: Your physician wants you to follow-up in: 6 months with Dr. Burt Knack. You will receive a reminder letter in the mail two months in advance. If you don't receive a letter, please call our office to schedule the follow-up appointment.        If you need a refill on your cardiac medications before your next appointment, please call your pharmacy.

## 2015-05-30 DIAGNOSIS — G609 Hereditary and idiopathic neuropathy, unspecified: Secondary | ICD-10-CM | POA: Diagnosis not present

## 2015-05-31 DIAGNOSIS — G609 Hereditary and idiopathic neuropathy, unspecified: Secondary | ICD-10-CM | POA: Diagnosis not present

## 2015-06-01 DIAGNOSIS — G609 Hereditary and idiopathic neuropathy, unspecified: Secondary | ICD-10-CM | POA: Diagnosis not present

## 2015-06-02 DIAGNOSIS — G609 Hereditary and idiopathic neuropathy, unspecified: Secondary | ICD-10-CM | POA: Diagnosis not present

## 2015-06-03 DIAGNOSIS — G609 Hereditary and idiopathic neuropathy, unspecified: Secondary | ICD-10-CM | POA: Diagnosis not present

## 2015-06-04 DIAGNOSIS — G609 Hereditary and idiopathic neuropathy, unspecified: Secondary | ICD-10-CM | POA: Diagnosis not present

## 2015-06-05 DIAGNOSIS — G609 Hereditary and idiopathic neuropathy, unspecified: Secondary | ICD-10-CM | POA: Diagnosis not present

## 2015-06-06 DIAGNOSIS — G609 Hereditary and idiopathic neuropathy, unspecified: Secondary | ICD-10-CM | POA: Diagnosis not present

## 2015-06-07 DIAGNOSIS — G609 Hereditary and idiopathic neuropathy, unspecified: Secondary | ICD-10-CM | POA: Diagnosis not present

## 2015-06-08 DIAGNOSIS — L03032 Cellulitis of left toe: Secondary | ICD-10-CM | POA: Diagnosis not present

## 2015-06-08 DIAGNOSIS — G609 Hereditary and idiopathic neuropathy, unspecified: Secondary | ICD-10-CM | POA: Diagnosis not present

## 2015-06-09 DIAGNOSIS — G609 Hereditary and idiopathic neuropathy, unspecified: Secondary | ICD-10-CM | POA: Diagnosis not present

## 2015-06-10 DIAGNOSIS — G609 Hereditary and idiopathic neuropathy, unspecified: Secondary | ICD-10-CM | POA: Diagnosis not present

## 2015-06-11 ENCOUNTER — Encounter: Payer: Self-pay | Admitting: *Deleted

## 2015-06-11 ENCOUNTER — Other Ambulatory Visit: Payer: Self-pay | Admitting: Internal Medicine

## 2015-06-11 ENCOUNTER — Telehealth: Payer: Self-pay | Admitting: Internal Medicine

## 2015-06-11 DIAGNOSIS — H50112 Monocular exotropia, left eye: Secondary | ICD-10-CM | POA: Diagnosis not present

## 2015-06-11 DIAGNOSIS — G609 Hereditary and idiopathic neuropathy, unspecified: Secondary | ICD-10-CM | POA: Diagnosis not present

## 2015-06-11 DIAGNOSIS — H401133 Primary open-angle glaucoma, bilateral, severe stage: Secondary | ICD-10-CM | POA: Diagnosis not present

## 2015-06-11 NOTE — Telephone Encounter (Signed)
Patient has been seen @ [facility] on 11/25/2014, 12/30/2014 and 05/12/2015.  All notes are being faxed.

## 2015-06-12 DIAGNOSIS — G609 Hereditary and idiopathic neuropathy, unspecified: Secondary | ICD-10-CM | POA: Diagnosis not present

## 2015-06-13 DIAGNOSIS — G609 Hereditary and idiopathic neuropathy, unspecified: Secondary | ICD-10-CM | POA: Diagnosis not present

## 2015-06-21 DIAGNOSIS — G609 Hereditary and idiopathic neuropathy, unspecified: Secondary | ICD-10-CM | POA: Diagnosis not present

## 2015-06-22 DIAGNOSIS — G609 Hereditary and idiopathic neuropathy, unspecified: Secondary | ICD-10-CM | POA: Diagnosis not present

## 2015-06-23 DIAGNOSIS — G609 Hereditary and idiopathic neuropathy, unspecified: Secondary | ICD-10-CM | POA: Diagnosis not present

## 2015-06-24 DIAGNOSIS — G609 Hereditary and idiopathic neuropathy, unspecified: Secondary | ICD-10-CM | POA: Diagnosis not present

## 2015-06-25 DIAGNOSIS — G609 Hereditary and idiopathic neuropathy, unspecified: Secondary | ICD-10-CM | POA: Diagnosis not present

## 2015-06-26 ENCOUNTER — Other Ambulatory Visit: Payer: Self-pay | Admitting: Internal Medicine

## 2015-06-26 DIAGNOSIS — G609 Hereditary and idiopathic neuropathy, unspecified: Secondary | ICD-10-CM | POA: Diagnosis not present

## 2015-06-27 DIAGNOSIS — G609 Hereditary and idiopathic neuropathy, unspecified: Secondary | ICD-10-CM | POA: Diagnosis not present

## 2015-06-28 DIAGNOSIS — G609 Hereditary and idiopathic neuropathy, unspecified: Secondary | ICD-10-CM | POA: Diagnosis not present

## 2015-06-29 DIAGNOSIS — G609 Hereditary and idiopathic neuropathy, unspecified: Secondary | ICD-10-CM | POA: Diagnosis not present

## 2015-06-30 ENCOUNTER — Other Ambulatory Visit: Payer: Self-pay | Admitting: Internal Medicine

## 2015-06-30 DIAGNOSIS — M5441 Lumbago with sciatica, right side: Secondary | ICD-10-CM

## 2015-06-30 DIAGNOSIS — M5442 Lumbago with sciatica, left side: Principal | ICD-10-CM

## 2015-06-30 DIAGNOSIS — G609 Hereditary and idiopathic neuropathy, unspecified: Secondary | ICD-10-CM | POA: Diagnosis not present

## 2015-06-30 NOTE — Telephone Encounter (Signed)
Pt requesting hydrocodone to be filled.  °

## 2015-07-01 DIAGNOSIS — T8189XD Other complications of procedures, not elsewhere classified, subsequent encounter: Secondary | ICD-10-CM | POA: Diagnosis not present

## 2015-07-01 DIAGNOSIS — G609 Hereditary and idiopathic neuropathy, unspecified: Secondary | ICD-10-CM | POA: Diagnosis not present

## 2015-07-01 MED ORDER — HYDROCODONE-ACETAMINOPHEN 5-325 MG PO TABS: 1.0000 | ORAL_TABLET | Freq: Four times a day (QID) | ORAL | Status: DC | PRN

## 2015-07-01 NOTE — Telephone Encounter (Signed)
LVM to advise RX ready

## 2015-07-02 ENCOUNTER — Other Ambulatory Visit: Payer: Self-pay | Admitting: Internal Medicine

## 2015-07-02 DIAGNOSIS — G609 Hereditary and idiopathic neuropathy, unspecified: Secondary | ICD-10-CM | POA: Diagnosis not present

## 2015-07-03 DIAGNOSIS — G609 Hereditary and idiopathic neuropathy, unspecified: Secondary | ICD-10-CM | POA: Diagnosis not present

## 2015-07-04 DIAGNOSIS — G609 Hereditary and idiopathic neuropathy, unspecified: Secondary | ICD-10-CM | POA: Diagnosis not present

## 2015-07-05 DIAGNOSIS — G609 Hereditary and idiopathic neuropathy, unspecified: Secondary | ICD-10-CM | POA: Diagnosis not present

## 2015-07-06 DIAGNOSIS — G609 Hereditary and idiopathic neuropathy, unspecified: Secondary | ICD-10-CM | POA: Diagnosis not present

## 2015-07-07 DIAGNOSIS — G609 Hereditary and idiopathic neuropathy, unspecified: Secondary | ICD-10-CM | POA: Diagnosis not present

## 2015-07-08 DIAGNOSIS — G609 Hereditary and idiopathic neuropathy, unspecified: Secondary | ICD-10-CM | POA: Diagnosis not present

## 2015-07-09 DIAGNOSIS — G609 Hereditary and idiopathic neuropathy, unspecified: Secondary | ICD-10-CM | POA: Diagnosis not present

## 2015-07-10 DIAGNOSIS — G609 Hereditary and idiopathic neuropathy, unspecified: Secondary | ICD-10-CM | POA: Diagnosis not present

## 2015-07-11 DIAGNOSIS — G609 Hereditary and idiopathic neuropathy, unspecified: Secondary | ICD-10-CM | POA: Diagnosis not present

## 2015-07-12 DIAGNOSIS — G609 Hereditary and idiopathic neuropathy, unspecified: Secondary | ICD-10-CM | POA: Diagnosis not present

## 2015-07-13 DIAGNOSIS — G609 Hereditary and idiopathic neuropathy, unspecified: Secondary | ICD-10-CM | POA: Diagnosis not present

## 2015-07-14 DIAGNOSIS — G609 Hereditary and idiopathic neuropathy, unspecified: Secondary | ICD-10-CM | POA: Diagnosis not present

## 2015-07-15 DIAGNOSIS — G609 Hereditary and idiopathic neuropathy, unspecified: Secondary | ICD-10-CM | POA: Diagnosis not present

## 2015-07-16 DIAGNOSIS — G609 Hereditary and idiopathic neuropathy, unspecified: Secondary | ICD-10-CM | POA: Diagnosis not present

## 2015-07-17 DIAGNOSIS — G609 Hereditary and idiopathic neuropathy, unspecified: Secondary | ICD-10-CM | POA: Diagnosis not present

## 2015-07-18 DIAGNOSIS — G609 Hereditary and idiopathic neuropathy, unspecified: Secondary | ICD-10-CM | POA: Diagnosis not present

## 2015-07-19 DIAGNOSIS — G609 Hereditary and idiopathic neuropathy, unspecified: Secondary | ICD-10-CM | POA: Diagnosis not present

## 2015-07-20 DIAGNOSIS — G609 Hereditary and idiopathic neuropathy, unspecified: Secondary | ICD-10-CM | POA: Diagnosis not present

## 2015-07-21 DIAGNOSIS — G609 Hereditary and idiopathic neuropathy, unspecified: Secondary | ICD-10-CM | POA: Diagnosis not present

## 2015-07-22 DIAGNOSIS — G609 Hereditary and idiopathic neuropathy, unspecified: Secondary | ICD-10-CM | POA: Diagnosis not present

## 2015-07-23 DIAGNOSIS — G609 Hereditary and idiopathic neuropathy, unspecified: Secondary | ICD-10-CM | POA: Diagnosis not present

## 2015-07-24 DIAGNOSIS — G609 Hereditary and idiopathic neuropathy, unspecified: Secondary | ICD-10-CM | POA: Diagnosis not present

## 2015-07-25 DIAGNOSIS — G609 Hereditary and idiopathic neuropathy, unspecified: Secondary | ICD-10-CM | POA: Diagnosis not present

## 2015-07-26 DIAGNOSIS — G609 Hereditary and idiopathic neuropathy, unspecified: Secondary | ICD-10-CM | POA: Diagnosis not present

## 2015-07-27 DIAGNOSIS — G609 Hereditary and idiopathic neuropathy, unspecified: Secondary | ICD-10-CM | POA: Diagnosis not present

## 2015-07-28 DIAGNOSIS — G609 Hereditary and idiopathic neuropathy, unspecified: Secondary | ICD-10-CM | POA: Diagnosis not present

## 2015-07-29 DIAGNOSIS — G609 Hereditary and idiopathic neuropathy, unspecified: Secondary | ICD-10-CM | POA: Diagnosis not present

## 2015-07-30 DIAGNOSIS — G609 Hereditary and idiopathic neuropathy, unspecified: Secondary | ICD-10-CM | POA: Diagnosis not present

## 2015-07-31 DIAGNOSIS — G609 Hereditary and idiopathic neuropathy, unspecified: Secondary | ICD-10-CM | POA: Diagnosis not present

## 2015-08-01 DIAGNOSIS — G609 Hereditary and idiopathic neuropathy, unspecified: Secondary | ICD-10-CM | POA: Diagnosis not present

## 2015-08-02 DIAGNOSIS — G609 Hereditary and idiopathic neuropathy, unspecified: Secondary | ICD-10-CM | POA: Diagnosis not present

## 2015-08-03 ENCOUNTER — Other Ambulatory Visit: Payer: Self-pay | Admitting: Internal Medicine

## 2015-08-03 DIAGNOSIS — M5442 Lumbago with sciatica, left side: Principal | ICD-10-CM

## 2015-08-03 DIAGNOSIS — M5441 Lumbago with sciatica, right side: Secondary | ICD-10-CM

## 2015-08-03 DIAGNOSIS — G609 Hereditary and idiopathic neuropathy, unspecified: Secondary | ICD-10-CM | POA: Diagnosis not present

## 2015-08-03 NOTE — Telephone Encounter (Signed)
Pt requesting hydrocodone to be filled.  °

## 2015-08-03 NOTE — Telephone Encounter (Signed)
Last filled 1/4 Last appt 04/2015 w/ dr Tiburcio Pea Last uds 2016

## 2015-08-04 DIAGNOSIS — G609 Hereditary and idiopathic neuropathy, unspecified: Secondary | ICD-10-CM | POA: Diagnosis not present

## 2015-08-04 MED ORDER — HYDROCODONE-ACETAMINOPHEN 5-325 MG PO TABS: 1.0000 | ORAL_TABLET | Freq: Four times a day (QID) | ORAL | Status: DC | PRN

## 2015-08-05 DIAGNOSIS — G609 Hereditary and idiopathic neuropathy, unspecified: Secondary | ICD-10-CM | POA: Diagnosis not present

## 2015-08-05 NOTE — Telephone Encounter (Signed)
Pt informed

## 2015-08-06 DIAGNOSIS — G609 Hereditary and idiopathic neuropathy, unspecified: Secondary | ICD-10-CM | POA: Diagnosis not present

## 2015-08-07 ENCOUNTER — Ambulatory Visit (INDEPENDENT_AMBULATORY_CARE_PROVIDER_SITE_OTHER): Payer: Commercial Managed Care - HMO | Admitting: Internal Medicine

## 2015-08-07 ENCOUNTER — Encounter: Payer: Self-pay | Admitting: Internal Medicine

## 2015-08-07 VITALS — BP 150/67 | HR 94 | Temp 98.1°F | Ht 66.0 in | Wt 261.1 lb

## 2015-08-07 DIAGNOSIS — E113599 Type 2 diabetes mellitus with proliferative diabetic retinopathy without macular edema, unspecified eye: Secondary | ICD-10-CM

## 2015-08-07 DIAGNOSIS — G609 Hereditary and idiopathic neuropathy, unspecified: Secondary | ICD-10-CM | POA: Diagnosis not present

## 2015-08-07 DIAGNOSIS — M6289 Other specified disorders of muscle: Secondary | ICD-10-CM

## 2015-08-07 DIAGNOSIS — I10 Essential (primary) hypertension: Secondary | ICD-10-CM

## 2015-08-07 DIAGNOSIS — Z794 Long term (current) use of insulin: Secondary | ICD-10-CM

## 2015-08-07 DIAGNOSIS — E1165 Type 2 diabetes mellitus with hyperglycemia: Secondary | ICD-10-CM | POA: Diagnosis not present

## 2015-08-07 DIAGNOSIS — G729 Myopathy, unspecified: Secondary | ICD-10-CM | POA: Diagnosis not present

## 2015-08-07 DIAGNOSIS — M542 Cervicalgia: Secondary | ICD-10-CM | POA: Insufficient documentation

## 2015-08-07 DIAGNOSIS — E1140 Type 2 diabetes mellitus with diabetic neuropathy, unspecified: Secondary | ICD-10-CM

## 2015-08-07 DIAGNOSIS — IMO0002 Reserved for concepts with insufficient information to code with codable children: Secondary | ICD-10-CM

## 2015-08-07 LAB — SEDIMENTATION RATE: SED RATE: 60 mm/h — AB (ref 0–22)

## 2015-08-07 LAB — POCT GLYCOSYLATED HEMOGLOBIN (HGB A1C): Hemoglobin A1C: 8.3

## 2015-08-07 LAB — GLUCOSE, CAPILLARY: GLUCOSE-CAPILLARY: 261 mg/dL — AB (ref 65–99)

## 2015-08-07 MED ORDER — INSULIN ASPART 100 UNIT/ML FLEXPEN: 10.0000 [IU] | PEN_INJECTOR | Freq: Three times a day (TID) | SUBCUTANEOUS | Status: DC

## 2015-08-07 MED ORDER — PREDNISONE 5 MG PO TABS: 15.0000 mg | ORAL_TABLET | Freq: Every day | ORAL | Status: DC

## 2015-08-07 NOTE — Assessment & Plan Note (Signed)
-  Last seen by Dr Katy Fitch 12/30/14

## 2015-08-07 NOTE — Patient Instructions (Addendum)
I think that you may have polymyalgia rheumatic. This condition requires steroids and a very gradual taper off steroids.   I am starting you on 15mg  of Prednisone, take this once a day.  It is VERY important that you follow up with me in 3-4 days to adjust this medication.  After we get you on the right dose we will have to keep that dose for a while and then gradually taper the dose down over weeks to months.  This is a medciation you CANNOT stop suddenly.  The prednisone will affect your sugars.  We will be calling you and help to monitor this.  You need to check your sugars 3 times a day.  I want you to continue Lantus at 42-44 units each night.  I want you to increase the Novolog to 10 units three times a day.

## 2015-08-07 NOTE — Assessment & Plan Note (Addendum)
A: Severe muslce stiffness with concern for PMR  P: She has severe muscle stiffness that is bilateral but more pronounced on her left side,  This has happened suddenly  without a clear inciting event.  Given her age, clinical history and pattern of her stiffness I am concerned for PMR however this has not been present for a full two weeks yet. - I will obtain a STAT Sed rate and if significantly elevated will start her on steroids with follow up in 3-4 days  ** Elevated ESR of 60, clinically this does appear to be PMR and I will start Prednisone at 67m/day, will see her back in 3-4 days to evaluate response and adjust steroids if needed. - I also will place her on our clinic diabetic steroid induced hyperglymia protocol.

## 2015-08-07 NOTE — Assessment & Plan Note (Signed)
A: Essential HTN, not at goal  P: - BP previously well controlled, likely due to non adherence in setting of illness, will have her continue her medications and have close follow up.

## 2015-08-07 NOTE — Assessment & Plan Note (Addendum)
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 - A1c is 8.3 today - If placed on steroids will put her on our glycemic control protocol.  I am starting prednisone for possible PMR. She is currently hyperglycemic and A1c is 8.3, she is underdosing her bolus insulin.  I will go ahead and have her increase her Novolog to 10 units TID with meals,  She can continue Lantus at 42 or 44 units QHS.  We will place her on our steroid protocol

## 2015-08-07 NOTE — Progress Notes (Signed)
[facility] Subjective:   Patient ID: [name] female   DOB: May 29, 1961 55 y.o.   MRN: [id number]  HPI: [name] is a 55 y.o. female with a PMH detailed below who presents for back and neck stiffness.  She reports that she started having stiffness on Sunday.  The stiffness has been severe to the point it is keeping her up at night, making it difficult for her to get in and our of bed or in and out of chairs.  She feels the stiffness most prominently in her shoulders, upper back, lower back and to a lesser degree in her hips.  She describes the pain as aching and throbing. The left side is more stiff than the right.  She denies any inciting events.  She also has more soreness in her left shoulder and left knee.  She denies any fever, chills, cough or sore throat.  She denies any nausea or vomiting.  She has never had anything like this happen before.  It has not gotten any better with time. She denies any symptoms of jaw claudication, she denies any headaches, she denies any new visual changes.  She has not taken her blood pressure medication this morning due to her stiffness.  She has run our of lantus 3 days ago and has not been to the pharmacy due to the muscle stiffness.  She has been taking 4-6 units of Novolog and has been taking her metformin.    Past Medical History  Diagnosis Date  . Hypertension   . Hyperlipidemia   . GERD (gastroesophageal reflux disease)   . Adhesive capsulitis of shoulder     bilateral, Steroid injection Dr. Truman Hayward 1/12 bilaterally  . Peripheral neuropathy ([place])   . Adhesive capsulitis of right shoulder     Steroid injection Dr. Truman Hayward 1/12  . Adhesive capsulitis of left shoulder     Steroid injection Dr. Truman Hayward 1/12  . Obesity   . Diabetic retinopathy   . Glaucoma   . Diabetes mellitus     Type II, insulin dependent  . CAD (coronary artery disease)     nonobstructive. Last cardiac cath (2008) showing left circumflex with  mid 50% stenosis and distal luminal irregularities. Also with RCA with mid to distal 30-40% stenosis. // Previously evaluated by [facility], never followed up outpatient.  . CHF (congestive heart failure) ([place])   . CAP (community acquired pneumonia) 06/15/2012    05/2012 CXR: Mild opacification of the posterior lung base on the lateral film  as cannot exclude infection/atelectasis.     Current Outpatient Prescriptions  Medication Sig Dispense Refill  . amitriptyline (ELAVIL) 25 MG tablet TAKE 1 TABLET BY MOUTH AT BEDTIME 90 tablet 0  . aspirin EC 81 MG EC tablet Take 1 tablet (81 mg total) by mouth daily.    . carvedilol (COREG) 6.25 MG tablet Take 1 tablet (6.25 mg total) by mouth 2 (two) times daily. 60 tablet 11  . cyclobenzaprine (FLEXERIL) 5 MG tablet Take 1 tablet (5 mg total) by mouth at bedtime. 270 tablet 1  . dorzolamide-timolol (COSOPT) 22.3-6.8 MG/ML ophthalmic solution Place 1 drop into the right eye 2 (two) times daily.    . ferrous sulfate 325 (65 FE) MG tablet Take 1 tablet (325 mg total) by mouth daily with breakfast. 30 tablet 11  . fluorometholone (FML) 0.1 % ophthalmic suspension Place 1 drop into both eyes 2 (two) times daily.     . furosemide (LASIX) 40  MG tablet Take 0.5 tablets (20 mg total) by mouth daily. 30 tablet 3  . gabapentin (NEURONTIN) 300 MG capsule TAKE 1 CAPSULE THREE TIMES DAILY 270 capsule 0  . glucose blood (ONETOUCH VERIO) test strip Check blood sugar as instructed up to 3 times a day 100 each 12  . HYDROcodone-acetaminophen (NORCO/VICODIN) 5-325 MG tablet Take 1 tablet by mouth every 6 (six) hours as needed for moderate pain (pain). Do not pick up before May 08, 2015. 90 tablet 0  . insulin aspart (NOVOLOG FLEXPEN) 100 UNIT/ML FlexPen Inject 10 Units into the skin 3 (three) times daily with meals. 15 mL 0  . Insulin Glargine (LANTUS SOLOSTAR) 100 UNIT/ML Solostar Pen Inject 44 Units into the skin at bedtime. 30 mL 6  . Insulin Pen Needle (B-D  UF III MINI PEN NEEDLES) 31G X 5 MM MISC Use to inject insulin 4-5 times daily 200 each 5  . latanoprost (XALATAN) 0.005 % ophthalmic solution Place 1 drop into both eyes daily.  4  . lisinopril (PRINIVIL,ZESTRIL) 20 MG tablet Take 0.5 tablets (10 mg total) by mouth daily. 90 tablet 3  . metFORMIN (GLUCOPHAGE) 1000 MG tablet TAKE 1 TABLET BY MOUTH TWICE DAILY WITH MEALS 180 tablet 0  . ondansetron (ZOFRAN) 4 MG tablet Take 1 tablet (4 mg total) by mouth every 8 (eight) hours as needed for nausea or vomiting. 30 tablet 0  . ONETOUCH DELICA LANCETS FINE MISC Check blood sugar as instructed up to 3 times a day 100 each 12  . pantoprazole (PROTONIX) 40 MG tablet Take 1 tablet (40 mg total) by mouth daily. 90 tablet 3  . permethrin (ELIMITE) 5 % cream APPLY TO ENTIRE BODY NECK DOWN,LEAVE ON 14 HOURS AND WASH OFF, REPEAT IN 1 WEEK AS DIRECTED 60 g 0  . pravastatin (PRAVACHOL) 20 MG tablet Take 1 tablet (20 mg total) by mouth daily. 30 tablet 3  . predniSONE (DELTASONE) 5 MG tablet Take 3 tablets (15 mg total) by mouth daily with breakfast. 90 tablet 0  . terbinafine (LAMISIL) 250 MG tablet Take 1 tablet (250 mg total) by mouth daily. For 3 months 120 tablet 0  . triamcinolone cream (KENALOG) 0.1 % Apply 1 application topically 2 (two) times daily. 30 g 0   No current facility-administered medications for this visit.   Family History  Problem Relation Age of Onset  . Hypertension Sister   . Hypertension Sister   . Hypertension Sister   . Heart attack Neg Hx   . Stroke Neg Hx    Social History   Social History  . Marital Status: Single    Spouse Name: N/A  . Number of Children: 2  . Years of Education: 12th grade   Occupational History  . 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    Social History Main Topics  . Smoking status: Former Smoker -- 0.30 packs/day for 7 years    Types: Cigarettes    Quit date: 06/27/2006  .  Smokeless tobacco: Never Used  . Alcohol Use: No  . Drug Use: No  . Sexual Activity: Not Currently   Other Topics Concern  . None   Social History Narrative   Lives with her kids, 46yo daughter and 71 yo adopted son.   Review of Systems: Review of Systems  Constitutional: Positive for malaise/fatigue. Negative for fever and chills.  Eyes: Negative for blurred vision and photophobia.  Respiratory: Negative  for cough.   Cardiovascular: Negative for chest pain.  Gastrointestinal: Negative for heartburn and abdominal pain.  Genitourinary: Negative for dysuria.  Musculoskeletal: Positive for myalgias and joint pain. Negative for falls.  Neurological: Positive for weakness. Negative for headaches.  Psychiatric/Behavioral: Negative for substance abuse.     Objective:  Physical Exam: Filed Vitals:   08/07/15 0940  BP: 150/67  Pulse: 94  Temp: 98.1 F (36.7 C)  TempSrc: Oral  Height: _0  (1.676 m)  Weight: 261 lb 1.6 oz (118.434 kg)  SpO2: 100%  Physical Exam  Constitutional: She is oriented to person, place, and time.  Musculoskeletal:       Right shoulder: She exhibits normal strength.       Left shoulder: She exhibits decreased range of motion and tenderness (mostly on posterior aspect). She exhibits no effusion and normal strength.       Back:  Neurological: She is alert and oriented to person, place, and time.  Reflex Scores:      Patellar reflexes are 2+ on the right side and 2+ on the left side. Upper extremity and lower extremity strength 5/5    Assessment & Plan:  Case discussed with Dr. Daryll Drown  Proliferative diabetic retinopathy [facility]) -Last seen by Dr Katy Fitch 12/30/14  Uncontrolled type 2 diabetes with neuropathy ([place]) A: Uncontrolled type 2 DM with diabetic neuropathy and retinopathy  P: - Ensure she has access to lantus, - She likely needs to go up on her Novolog and better teaching of sliding scale. - A1c is 8.3 today - If placed on steroids will put her  on our glycemic control protocol.  I am starting prednisone for possible PMR. She is currently hyperglycemic and A1c is 8.3, she is underdosing her bolus insulin.  I will go ahead and have her increase her Novolog to 10 units TID with meals,  She can continue Lantus at 42 or 44 units QHS.  We will place her on our steroid protocol  Essential hypertension A: Essential HTN, not at goal  P: - BP previously well controlled, likely due to non adherence in setting of illness, will have her continue her medications and have close follow up.  Muscle stiffness A: Severe muslce stiffness with concern for PMR  P: She has severe muscle stiffness that is bilateral but more pronounced on her left side,  This has happened suddenly  without a clear inciting event.  Given her age, clinical history and pattern of her stiffness I am concerned for PMR however this has not been present for a full two weeks yet. - I will obtain a STAT Sed rate and if significantly elevated will start her on steroids with follow up in 3-4 days  ** Elevated ESR of 60, clinically this does appear to be PMR and I will start Prednisone at 51m/day, will see her back in 3-4 days to evaluate response and adjust steroids if needed. - I also will place her on our clinic diabetic steroid induced hyperglymia protocol.    Medications Ordered Meds ordered this encounter  Medications  . predniSONE (DELTASONE) 5 MG tablet    Sig: Take 3 tablets (15 mg total) by mouth daily with breakfast.    Dispense:  90 tablet    Refill:  0  . insulin aspart (NOVOLOG FLEXPEN) 100 UNIT/ML FlexPen    Sig: Inject 10 Units into the skin 3 (three) times daily with meals.    Dispense:  15 mL    Refill:  0  The patient is insulin requiring, ICD 10 code E11.65. The patient tests times per day.   Other Orders Orders Placed This Encounter  Procedures  . Glucose, capillary  . Sed Rate (ESR)  . CBC with Diff  . CMP14 + Anion Gap  . CRP (C-Reactive  Protein)  . POCT HgB A1C (CPT 83036)   Follow Up: Return 3-4 days with Dr Heber [place].

## 2015-08-08 DIAGNOSIS — G609 Hereditary and idiopathic neuropathy, unspecified: Secondary | ICD-10-CM | POA: Diagnosis not present

## 2015-08-08 LAB — CBC WITH DIFFERENTIAL/PLATELET
BASOS: 0 %
Basophils Absolute: 0 10*3/uL (ref 0.0–0.2)
EOS (ABSOLUTE): 0.1 10*3/uL (ref 0.0–0.4)
EOS: 1 %
HEMATOCRIT: 32.7 % — AB (ref 34.0–46.6)
HEMOGLOBIN: 10.4 g/dL — AB (ref 11.1–15.9)
IMMATURE GRANS (ABS): 0 10*3/uL (ref 0.0–0.1)
IMMATURE GRANULOCYTES: 0 %
LYMPHS: 32 %
Lymphocytes Absolute: 3.7 10*3/uL — ABNORMAL HIGH (ref 0.7–3.1)
MCH: 28.2 pg (ref 26.6–33.0)
MCHC: 31.8 g/dL (ref 31.5–35.7)
MCV: 89 fL (ref 79–97)
MONOCYTES: 6 %
Monocytes Absolute: 0.7 10*3/uL (ref 0.1–0.9)
NEUTROS ABS: 7 10*3/uL (ref 1.4–7.0)
NEUTROS PCT: 61 %
Platelets: 308 10*3/uL (ref 150–379)
RBC: 3.69 x10E6/uL — ABNORMAL LOW (ref 3.77–5.28)
RDW: 14.9 % (ref 12.3–15.4)
WBC: 11.6 10*3/uL — ABNORMAL HIGH (ref 3.4–10.8)

## 2015-08-08 LAB — CMP14 + ANION GAP
A/G RATIO: 1.4 (ref 1.1–2.5)
ALT: 10 IU/L (ref 0–32)
ANION GAP: 20 mmol/L — AB (ref 10.0–18.0)
AST: 8 IU/L (ref 0–40)
Albumin: 4.2 g/dL (ref 3.5–5.5)
Alkaline Phosphatase: 83 IU/L (ref 39–117)
BILIRUBIN TOTAL: 0.2 mg/dL (ref 0.0–1.2)
BUN / CREAT RATIO: 21 (ref 9–23)
BUN: 19 mg/dL (ref 6–24)
CHLORIDE: 93 mmol/L — AB (ref 96–106)
CO2: 22 mmol/L (ref 18–29)
Calcium: 9.2 mg/dL (ref 8.7–10.2)
Creatinine, Ser: 0.92 mg/dL (ref 0.57–1.00)
GFR, EST AFRICAN AMERICAN: 80 mL/min/{1.73_m2} (ref >59)
GFR, EST NON AFRICAN AMERICAN: 70 mL/min/{1.73_m2} (ref >59)
GLOBULIN, TOTAL: 2.9 g/dL (ref 1.5–4.5)
Glucose: 254 mg/dL — ABNORMAL HIGH (ref 65–99)
POTASSIUM: 4.9 mmol/L (ref 3.5–5.2)
SODIUM: 135 mmol/L (ref 134–144)
TOTAL PROTEIN: 7.1 g/dL (ref 6.0–8.5)

## 2015-08-08 LAB — C-REACTIVE PROTEIN: CRP: 55.8 mg/L — ABNORMAL HIGH (ref 0.0–4.9)

## 2015-08-09 DIAGNOSIS — G609 Hereditary and idiopathic neuropathy, unspecified: Secondary | ICD-10-CM | POA: Diagnosis not present

## 2015-08-10 DIAGNOSIS — G609 Hereditary and idiopathic neuropathy, unspecified: Secondary | ICD-10-CM | POA: Diagnosis not present

## 2015-08-11 DIAGNOSIS — G609 Hereditary and idiopathic neuropathy, unspecified: Secondary | ICD-10-CM | POA: Diagnosis not present

## 2015-08-12 DIAGNOSIS — G609 Hereditary and idiopathic neuropathy, unspecified: Secondary | ICD-10-CM | POA: Diagnosis not present

## 2015-08-13 DIAGNOSIS — G609 Hereditary and idiopathic neuropathy, unspecified: Secondary | ICD-10-CM | POA: Diagnosis not present

## 2015-08-14 DIAGNOSIS — G609 Hereditary and idiopathic neuropathy, unspecified: Secondary | ICD-10-CM | POA: Diagnosis not present

## 2015-08-15 ENCOUNTER — Emergency Department (HOSPITAL_COMMUNITY)
Admission: EM | Admit: 2015-08-15 | Discharge: 2015-08-15 | Disposition: A | Discharge status: Home / Self Care | Payer: Commercial Managed Care - HMO | Attending: Emergency Medicine | Admitting: Emergency Medicine

## 2015-08-15 ENCOUNTER — Encounter (HOSPITAL_COMMUNITY): Payer: Self-pay | Admitting: Emergency Medicine

## 2015-08-15 DIAGNOSIS — I509 Heart failure, unspecified: Secondary | ICD-10-CM | POA: Diagnosis not present

## 2015-08-15 DIAGNOSIS — Z9889 Other specified postprocedural states: Secondary | ICD-10-CM | POA: Diagnosis not present

## 2015-08-15 DIAGNOSIS — Z79899 Other long term (current) drug therapy: Secondary | ICD-10-CM | POA: Insufficient documentation

## 2015-08-15 DIAGNOSIS — M791 Myalgia, unspecified site: Secondary | ICD-10-CM

## 2015-08-15 DIAGNOSIS — G629 Polyneuropathy, unspecified: Secondary | ICD-10-CM | POA: Insufficient documentation

## 2015-08-15 DIAGNOSIS — Z87891 Personal history of nicotine dependence: Secondary | ICD-10-CM | POA: Diagnosis not present

## 2015-08-15 DIAGNOSIS — H409 Unspecified glaucoma: Secondary | ICD-10-CM | POA: Insufficient documentation

## 2015-08-15 DIAGNOSIS — Z7952 Long term (current) use of systemic steroids: Secondary | ICD-10-CM | POA: Insufficient documentation

## 2015-08-15 DIAGNOSIS — G609 Hereditary and idiopathic neuropathy, unspecified: Secondary | ICD-10-CM | POA: Diagnosis not present

## 2015-08-15 DIAGNOSIS — Z794 Long term (current) use of insulin: Secondary | ICD-10-CM | POA: Diagnosis not present

## 2015-08-15 DIAGNOSIS — I10 Essential (primary) hypertension: Secondary | ICD-10-CM | POA: Diagnosis not present

## 2015-08-15 DIAGNOSIS — E119 Type 2 diabetes mellitus without complications: Secondary | ICD-10-CM | POA: Insufficient documentation

## 2015-08-15 DIAGNOSIS — M542 Cervicalgia: Secondary | ICD-10-CM | POA: Insufficient documentation

## 2015-08-15 DIAGNOSIS — Z7982 Long term (current) use of aspirin: Secondary | ICD-10-CM | POA: Insufficient documentation

## 2015-08-15 DIAGNOSIS — Z8701 Personal history of pneumonia (recurrent): Secondary | ICD-10-CM | POA: Diagnosis not present

## 2015-08-15 DIAGNOSIS — K219 Gastro-esophageal reflux disease without esophagitis: Secondary | ICD-10-CM | POA: Diagnosis not present

## 2015-08-15 DIAGNOSIS — R531 Weakness: Secondary | ICD-10-CM | POA: Diagnosis not present

## 2015-08-15 DIAGNOSIS — E669 Obesity, unspecified: Secondary | ICD-10-CM | POA: Diagnosis not present

## 2015-08-15 DIAGNOSIS — I2510 Atherosclerotic heart disease of native coronary artery without angina pectoris: Secondary | ICD-10-CM | POA: Insufficient documentation

## 2015-08-15 DIAGNOSIS — E785 Hyperlipidemia, unspecified: Secondary | ICD-10-CM | POA: Diagnosis not present

## 2015-08-15 MED ORDER — METHOCARBAMOL 500 MG PO TABS
1000.0000 mg | ORAL_TABLET | Freq: Once | ORAL | Status: AC
  Administered 2015-08-15: 1000 mg via ORAL
  Filled 2015-08-15: qty 2

## 2015-08-15 MED ORDER — CYCLOBENZAPRINE HCL 10 MG PO TABS: 10.0000 mg | ORAL_TABLET | Freq: Two times a day (BID) | ORAL | Status: DC | PRN

## 2015-08-15 NOTE — Progress Notes (Signed)
Internal Medicine Clinic Attending  Case discussed with Dr. Hoffman at the time of the visit.  We reviewed the resident's history and exam and pertinent patient test results.  I agree with the assessment, diagnosis, and plan of care documented in the resident's note.  

## 2015-08-15 NOTE — Discharge Instructions (Signed)
1. Medications: Take muscle relaxer as needed - This can make you very drowsy - please do not drink or drive on this medication, continue usual home medications 2. Treatment: rest, drink plenty of fluids 3. Follow Up: Please follow up with your primary doctor in 3 days for discussion of your diagnoses and further evaluation after today's visit; Please return to the ER for new or worsening symptoms, any additional concerns.

## 2015-08-15 NOTE — ED Notes (Signed)
Pt c/o posterior neck pain that radiates down R shoulder & arm. Having difficulty sleeping. Also c/o back pain to radiates to abdomen. Says the outpatient clinic put her on steroids that haven't done any good thus far.

## 2015-08-15 NOTE — ED Provider Notes (Signed)
CSN: [id number]     Arrival date & time 08/15/15  2778 History   First MD Initiated Contact with Patient 08/15/15 [phone]     Chief Complaint  Patient presents with  . Neck Pain     (Consider location/radiation/quality/duration/timing/severity/associated sxs/prior Treatment) Patient is a 55 y.o. female presenting with neck pain. The history is provided by the patient and medical records. No language interpreter was used.  Neck Pain Associated symptoms: weakness   Associated symptoms: no fever and no headaches    [name] is a 55 y.o. female  with a PMH of multiple chronic medical conditions who presents to the Emergency Department complaining of progressively worsening posterior neck pain, bilateral shoulder pain L>R, and bilateral hip pain x 2 weeks described as aching. Patient states she saw PCP - per chart review this was on 08/07/15, where it was thought she had PMR - her ESR at this visit was 60 and CRP 55.8. She was started on prednisone 1m/day however symptoms are not improving despite compliance with medication.   Past Medical History  Diagnosis Date  . Hypertension   . Hyperlipidemia   . GERD (gastroesophageal reflux disease)   . Adhesive capsulitis of shoulder     bilateral, Steroid injection Dr. LTruman Hayward1/12 bilaterally  . [place]   . Adhesive capsulitis of right shoulder     Steroid injection Dr. LTruman Hayward1/12  . Adhesive capsulitis of left shoulder     Steroid injection Dr. LTruman Hayward1/12  . Obesity   . Diabetic retinopathy   . Glaucoma   . Diabetes mellitus     Type II, insulin dependent  . CAD (coronary artery disease)     nonobstructive. Last cardiac cath (2008) showing left circumflex with mid 50% stenosis and distal luminal irregularities. Also with RCA with mid to distal 30-40% stenosis. // [facility].  . CH[place]estive heart [place]) [place]   . CAP (community acquired pneumonia)  06/15/2012    05/2012 CXR: Mild opacification of the posterior lung base on the lateral film  as cannot exclude infection/atelectasis.     Past Surgical History  Procedure Laterality Date  . Cataract extraction    . Glaucoma surgery    . Cardiac catheterization    . Tubal ligation    . Left heart catheterization with coronary angiogram N/A 01/24/2012    Procedure: LEFT HEART CATHETERIZATION WITH CORONARY ANGIOGRAM;  Surgeon: MSherren Mocha MD;  [facility];  Service: Cardiovascular;  Laterality: N/A;   Family History  Problem Relation Age of Onset  . Hypertension Sister   . Hypertension Sister   . Hypertension Sister   . Heart attack Neg Hx   . Stroke Neg Hx    Social History  Substance Use Topics  . Smoking status: Former Smoker -- 0.30 packs/day for 7 years    Types: Cigarettes    Quit date: 06/27/2006  . Smokeless tobacco: Never Used  . Alcohol Use: No   OB History    No data available     Review of Systems  Constitutional: Negative for fever and chills.  HENT: Negative for congestion and sore throat.   Eyes: Negative for visual disturbance.  Respiratory: Negative for cough and shortness of breath.   Cardiovascular: Negative.   Gastrointestinal: Negative for nausea, vomiting and abdominal pain.  Genitourinary: Negative for dysuria.  Musculoskeletal: Positive for myalgias and neck pain.  Skin: Negative for rash.  Neurological: Positive for weakness.  Negative for dizziness and headaches.      Allergies  Review of patient's allergies indicates no known allergies.  Home Medications   Prior to Admission medications   Medication Sig Start Date End Date Taking? Authorizing Provider  amitriptyline (ELAVIL) 25 MG tablet TAKE 1 TABLET BY MOUTH AT BEDTIME 07/03/15  Yes Collier Salina, MD  aspirin EC 81 MG EC tablet Take 1 tablet (81 mg total) by mouth daily. 05/01/15  Yes Erma Heritage, PA  carvedilol (COREG) 6.25 MG tablet Take 1 tablet (6.25 mg total)  by mouth 2 (two) times daily. 12/04/14  Yes Luan Moore, MD  dorzolamide-timolol (COSOPT) 22.3-6.8 MG/ML ophthalmic solution Place 1 drop into both eyes 2 (two) times daily.    Yes Historical Provider, MD  ferrous sulfate 325 (65 FE) MG tablet Take 1 tablet (325 mg total) by mouth daily with breakfast. 06/15/12  Yes Dorian Heckle, MD  fluorometholone (FML) 0.1 % ophthalmic suspension Place 1 drop into both eyes 2 (two) times daily.  05/27/15  Yes Historical Provider, MD  furosemide (LASIX) 40 MG tablet Take 0.5 tablets (20 mg total) by mouth daily. 10/31/14  Yes Tasrif Ahmed, MD  gabapentin (NEURONTIN) 300 MG capsule TAKE 1 CAPSULE THREE TIMES DAILY Patient taking differently: TAKE 1 CAPSULE TWO TIMES DAILY 06/11/15  Yes Collier Salina, MD  glucose blood (ONETOUCH VERIO) test strip Check blood sugar as instructed up to 3 times a day 02/26/14  Yes Ejiroghene E Emokpae, MD  HYDROcodone-acetaminophen (NORCO/VICODIN) 5-325 MG tablet Take 1 tablet by mouth every 6 (six) hours as needed for moderate pain (pain). Do not pick up before May 08, 2015. 08/04/15  Yes Collier Salina, MD  insulin aspart (NOVOLOG FLEXPEN) 100 UNIT/ML FlexPen Inject 10 Units into the skin 3 (three) times daily with meals. 08/07/15  Yes Lucious Groves, DO  Insulin Glargine (LANTUS SOLOSTAR) 100 UNIT/ML Solostar Pen Inject 44 Units into the skin at bedtime. 01/30/15  Yes Collier Salina, MD  Insulin Pen Needle (B-D UF III MINI PEN NEEDLES) 31G X 5 MM MISC Use to inject insulin 4-5 times daily   Yes Madilyn Fireman, MD  latanoprost (XALATAN) 0.005 % ophthalmic solution Place 1 drop into both eyes at bedtime.  05/12/15  Yes Historical Provider, MD  lisinopril (PRINIVIL,ZESTRIL) 20 MG tablet Take 0.5 tablets (10 mg total) by mouth daily. 04/07/15  Yes Liberty Handy, MD  metFORMIN (GLUCOPHAGE) 1000 MG tablet TAKE 1 TABLET BY MOUTH TWICE DAILY WITH MEALS 02/23/15  Yes Collier Salina, MD  ondansetron (ZOFRAN) 4 MG tablet Take 1  tablet (4 mg total) by mouth every 8 (eight) hours as needed for nausea or vomiting. 03/31/15  Yes Corky Sox, MD  [facility] DELICA LANCETS FINE MISC Check blood sugar as instructed up to 3 times a day 02/26/14  Yes Ejiroghene E Emokpae, MD  pantoprazole (PROTONIX) 40 MG tablet Take 1 tablet (40 mg total) by mouth daily. 02/19/15  Yes Tasrif Ahmed, MD  pravastatin (PRAVACHOL) 20 MG tablet Take 1 tablet (20 mg total) by mouth daily. 12/04/14  Yes Luan Moore, MD  predniSONE (DELTASONE) 5 MG tablet Take 3 tablets (15 mg total) by mouth daily with breakfast. 08/07/15  Yes Lucious Groves, DO  triamcinolone cream (KENALOG) 0.1 % Apply 1 application topically 2 (two) times daily. Patient taking differently: Apply 1 application topically 2 (two) times daily as needed (itching).  02/19/15  Yes Tasrif Ahmed, MD  cyclobenzaprine (FLEXERIL) 10 MG tablet Take 1  tablet (10 mg total) by mouth 2 (two) times daily as needed for muscle spasms. 08/15/15   Iren Whipp Pilcher Taneasha Fuqua, PA-C   BP 138/64 mmHg  Pulse 98  Temp(Src) 97.8 F (36.6 C) (Oral)  Resp 20  SpO2 98%  LMP 02/13/2011 Physical Exam  Constitutional: She is oriented to person, place, and time. She appears well-developed and well-nourished.  Alert and in no acute distress  HENT:  Head: Normocephalic and atraumatic.  Cardiovascular: Normal rate, regular rhythm, normal heart sounds and intact distal pulses.  Exam reveals no gallop and no friction rub.   No murmur heard. Pulmonary/Chest: Effort normal and breath sounds normal. No respiratory distress. She has no wheezes. She has no rales.  Abdominal: Soft. Bowel sounds are normal.  Musculoskeletal: She exhibits no edema.       Arms: Neurological: She is alert and oriented to person, place, and time.  All four extremities neurovascularly intact.   Skin: Skin is warm and dry. No rash noted.  Psychiatric: She has a normal mood and affect. Her behavior is normal. Judgment and thought content normal.  Nursing note  and vitals reviewed.   ED Course  Procedures (including critical care time) Labs Review Labs Reviewed - No data to display  Imaging Review No results found. I have personally reviewed and evaluated these images and lab results as part of my medical decision-making.   EKG Interpretation None      MDM   Final diagnoses:  Neck pain  Myalgia   [name] presents with worsening bilateral neck, shoulder, and back pain. Seen by PCP on 2/10 with elevated ESR/CRP - possibly PMR and started on prednisone 28m/day. Patient states pain persists despite compliance with medication. Denies chest pain, shortness of breath, fever. Exam c/w msk etiology. Will treat patient with muscle relaxer and encouraged follow up with PCP. Patient states understanding with and agreement to plan as dictated above. Return precautions given, all questions answered.   Patient discussed with Dr. SBilly Fischerwho agrees with treatment plan.   J[facility] And Medical CenterWard, PA-C 08/15/15 [id number] EGareth Morgan MD 08/17/15 [id number]

## 2015-08-16 DIAGNOSIS — G609 Hereditary and idiopathic neuropathy, unspecified: Secondary | ICD-10-CM | POA: Diagnosis not present

## 2015-08-17 DIAGNOSIS — G609 Hereditary and idiopathic neuropathy, unspecified: Secondary | ICD-10-CM | POA: Diagnosis not present

## 2015-08-18 DIAGNOSIS — G609 Hereditary and idiopathic neuropathy, unspecified: Secondary | ICD-10-CM | POA: Diagnosis not present

## 2015-08-19 DIAGNOSIS — G609 Hereditary and idiopathic neuropathy, unspecified: Secondary | ICD-10-CM | POA: Diagnosis not present

## 2015-08-20 DIAGNOSIS — G609 Hereditary and idiopathic neuropathy, unspecified: Secondary | ICD-10-CM | POA: Diagnosis not present

## 2015-08-21 DIAGNOSIS — G609 Hereditary and idiopathic neuropathy, unspecified: Secondary | ICD-10-CM | POA: Diagnosis not present

## 2015-08-22 DIAGNOSIS — G609 Hereditary and idiopathic neuropathy, unspecified: Secondary | ICD-10-CM | POA: Diagnosis not present

## 2015-08-30 DIAGNOSIS — G609 Hereditary and idiopathic neuropathy, unspecified: Secondary | ICD-10-CM | POA: Diagnosis not present

## 2015-08-31 DIAGNOSIS — G609 Hereditary and idiopathic neuropathy, unspecified: Secondary | ICD-10-CM | POA: Diagnosis not present

## 2015-09-01 DIAGNOSIS — G609 Hereditary and idiopathic neuropathy, unspecified: Secondary | ICD-10-CM | POA: Diagnosis not present

## 2015-09-02 DIAGNOSIS — G609 Hereditary and idiopathic neuropathy, unspecified: Secondary | ICD-10-CM | POA: Diagnosis not present

## 2015-09-03 ENCOUNTER — Telehealth: Payer: Self-pay | Admitting: Internal Medicine

## 2015-09-03 DIAGNOSIS — G609 Hereditary and idiopathic neuropathy, unspecified: Secondary | ICD-10-CM | POA: Diagnosis not present

## 2015-09-03 NOTE — Telephone Encounter (Signed)
Called pt back, someone answered and hung up then i called abck and no one answered

## 2015-09-03 NOTE — Telephone Encounter (Signed)
Have tried to call twice, 1 time someone hung up, second time it rang for a very long time

## 2015-09-03 NOTE — Telephone Encounter (Signed)
NEEDS APPT FOR TOMORROW IF POSSIBLE

## 2015-09-03 NOTE — Telephone Encounter (Signed)
NEEDS APPT FOR TOMORROW, TERRIBLE PAIN IN HER ARM.

## 2015-09-04 ENCOUNTER — Other Ambulatory Visit: Payer: Self-pay | Admitting: Internal Medicine

## 2015-09-04 DIAGNOSIS — M5442 Lumbago with sciatica, left side: Principal | ICD-10-CM

## 2015-09-04 DIAGNOSIS — M5441 Lumbago with sciatica, right side: Secondary | ICD-10-CM

## 2015-09-04 DIAGNOSIS — G609 Hereditary and idiopathic neuropathy, unspecified: Secondary | ICD-10-CM | POA: Diagnosis not present

## 2015-09-04 NOTE — Telephone Encounter (Signed)
Last refill 2/7 Last 2/10, needs appt Last uds 12/2014

## 2015-09-04 NOTE — Telephone Encounter (Signed)
appt for 3/15 at 0815 for shoulder pain, dr Genene Churn

## 2015-09-04 NOTE — Telephone Encounter (Signed)
Pt requesting hydrocodone to be filled.  °

## 2015-09-04 NOTE — Telephone Encounter (Signed)
appt made for 3/15 at 0815 dr Genene Churn

## 2015-09-05 DIAGNOSIS — G609 Hereditary and idiopathic neuropathy, unspecified: Secondary | ICD-10-CM | POA: Diagnosis not present

## 2015-09-06 DIAGNOSIS — G609 Hereditary and idiopathic neuropathy, unspecified: Secondary | ICD-10-CM | POA: Diagnosis not present

## 2015-09-06 NOTE — Telephone Encounter (Signed)
Patient needs to come to her appointment on 3/15 before refilling this medication. Fine refilling it at that time.  She has chronically no showed and Dr. Heber Hopewell saw her on 2/10 with instruction for close follow up after starting steroid therapy for PMR. She needs to comply with these recommended alternative therapies for continued opiate pain management to be appropriate.

## 2015-09-07 DIAGNOSIS — G609 Hereditary and idiopathic neuropathy, unspecified: Secondary | ICD-10-CM | POA: Diagnosis not present

## 2015-09-07 NOTE — Telephone Encounter (Signed)
I can definitely give her the Rx for refill. I don't know if we managing her PMR is a great way to approach it. It involves very slow taper of steroid and going back and forth sometimes. Maybe she would be better served by rheum.

## 2015-09-07 NOTE — Telephone Encounter (Signed)
Spoke with pt and she was informed that refill will be addressed during 03/15 office visit with DrAhmed, Phone call complete.Despina Hidden Cassady3/13/201711:46 AM

## 2015-09-08 DIAGNOSIS — G609 Hereditary and idiopathic neuropathy, unspecified: Secondary | ICD-10-CM | POA: Diagnosis not present

## 2015-09-09 ENCOUNTER — Telehealth: Payer: Self-pay | Admitting: Dietician

## 2015-09-09 ENCOUNTER — Ambulatory Visit (INDEPENDENT_AMBULATORY_CARE_PROVIDER_SITE_OTHER): Payer: Commercial Managed Care - HMO | Admitting: Internal Medicine

## 2015-09-09 ENCOUNTER — Encounter: Payer: Self-pay | Admitting: Internal Medicine

## 2015-09-09 VITALS — BP 137/71 | HR 104 | Temp 98.5°F | Wt 262.8 lb

## 2015-09-09 DIAGNOSIS — M542 Cervicalgia: Secondary | ICD-10-CM

## 2015-09-09 DIAGNOSIS — I10 Essential (primary) hypertension: Secondary | ICD-10-CM | POA: Diagnosis not present

## 2015-09-09 DIAGNOSIS — M25511 Pain in right shoulder: Secondary | ICD-10-CM | POA: Diagnosis not present

## 2015-09-09 DIAGNOSIS — G609 Hereditary and idiopathic neuropathy, unspecified: Secondary | ICD-10-CM | POA: Diagnosis not present

## 2015-09-09 MED ORDER — LISINOPRIL 20 MG PO TABS: 10.0000 mg | ORAL_TABLET | Freq: Every day | ORAL | Status: DC

## 2015-09-09 MED ORDER — DICLOFENAC SODIUM 1 % TD GEL: 2.0000 g | Freq: Four times a day (QID) | TRANSDERMAL | Status: DC

## 2015-09-09 MED ORDER — HYDROCODONE-ACETAMINOPHEN 5-325 MG PO TABS: 1.0000 | ORAL_TABLET | Freq: Four times a day (QID) | ORAL | Status: DC | PRN

## 2015-09-09 MED ORDER — CYCLOBENZAPRINE HCL 10 MG PO TABS: 10.0000 mg | ORAL_TABLET | Freq: Two times a day (BID) | ORAL | Status: DC | PRN

## 2015-09-09 MED ORDER — FUROSEMIDE 40 MG PO TABS: 20.0000 mg | ORAL_TABLET | Freq: Every day | ORAL | Status: DC

## 2015-09-09 NOTE — Assessment & Plan Note (Addendum)
Has right sided neck pain and shoulder pain that does not radiate down below her elbow. Not shooting pain. Has tenderness to touch on exam over her paraspinal and trap muscle. ESR and CRP were both high but her history is not suggestive of PMR (stiffness <20 mins, not involving b/l joints, it's more muscle pain than joint pain/stiffness). Failed prednisone trial, which is also indicative of this not being PMR).   - will check CK. She may have localized paraspinal myositis of her neck area on right side. - will check Cspine MRI to look for inflammation. - voltaren gel to treat local inflammation. - PT referral. - f/up PRN. Refilled Norco today which she takes for chronic low back pain.

## 2015-09-09 NOTE — Telephone Encounter (Signed)
Patient's name came up on steroid report. She does say that the Prednisone made her blood sugars go up so she stopped it. Her blood sugars were in the  300-400s Blood sugar 132 this am . She agreed to an appointment with diabetes educator on same day she sees her doctor. Request a referral.

## 2015-09-09 NOTE — Progress Notes (Signed)
Internal Medicine Clinic Attending  Case discussed with Dr. Ahmed at the time of the visit.  We reviewed the resident's history and exam and pertinent patient test results.  I agree with the assessment, diagnosis, and plan of care documented in the resident's note. 

## 2015-09-09 NOTE — Patient Instructions (Signed)
We will get further testing today including MRI and lab work.  Referred to Physical therapy.  Use voltaren gel on your neck area for pain relief.

## 2015-09-09 NOTE — Telephone Encounter (Signed)
I did not address her diabetes today. She will come back on April to see her PCP for this.

## 2015-09-09 NOTE — Assessment & Plan Note (Signed)
Filed Vitals:   09/09/15 0845  BP: 137/71  Pulse: 104  Temp: 98.5 F (36.9 C)   BP well controlled. Refilled lisinopril and Lasix. Did not make any changes to her meds.

## 2015-09-09 NOTE — Telephone Encounter (Signed)
Called patient's pharmacy  as part of project trying to help patient's with a1C between 8-9% lower their blood sugars to target.  Her diabetes refill history is as follows:  1. Novolog-filled 15 ml (1 box)  Aug 20, 2015 60ml, May 07, 2015, aug 11th, 2016- They need a new prescription if we want her to take 10 units, their rx reads 4-6 units with meals 2. Lantus- 2 boxes (30 mL) filled on feb 10th, 2017, May 20, 2015, Feb 04, 2015 Directions are : 44 units at bedtime 3. Metformin- 12/2 180 tablets, may 2016 180 tablets  4. One touch verio strips-last filled January 2016- 100 strips.  It appears patient is getting less metformin and testing supplies than needed. Suggest updating testing supply RXs and Novolog. Prescriptions. Could consider metformin ER.

## 2015-09-09 NOTE — Progress Notes (Signed)
Subjective:    Patient ID: [name], female    DOB: 03/02/1961, 54 y.o.   MRN: [id number]  HPI  54 yo female with uncontrolled DM II, CAD, HTN, GERD, obesity, presents for follow up of neck pain/shoulder pain on right side.  Saw Dr. Hoffman for this on January. Checked ESR and CRP which were both elevated. It was thought that it could be from PMR. Did trial of prednisone 15mg/day, did not have any improvement after 7 days. Went to the ED, ED stopped her prednisone and thought this could be from muscle tightness, gave her flexiril. She has some improvement when she takes flexiril but still having lot of pain. Pain is located on central neck and also on her neck muscles and goes to her right shoulder. Painful to move her neck, feels tight. Does not radiate down any further. No weakness/numbness/tingling. Has stiffness <20 mins in the morning. No other joint involvement. Has hip pain from previous hip injury but no hip joint stiffness.   Wants me to refill her Lasix and lisinopril.   Review of Systems  Constitutional: Negative for fever and chills.  HENT: Negative for congestion, sinus pressure and sore throat.   Eyes: Negative for photophobia and visual disturbance.  Respiratory: Negative for cough, chest tightness and shortness of breath.   Cardiovascular: Negative for chest pain, palpitations and leg swelling.  Gastrointestinal: Negative for abdominal pain and abdominal distention.  Endocrine: Negative.   Genitourinary: Negative.   Musculoskeletal: Positive for myalgias, neck pain and neck stiffness. Negative for joint swelling.  Neurological: Negative for dizziness, numbness and headaches.       Objective:   Physical Exam  Constitutional: She is oriented to person, place, and time. She appears well-developed and well-nourished. No distress.  HENT:  Head: Normocephalic and atraumatic.  Eyes: EOM are normal. Pupils are equal, round, and reactive to light.  Neck:  Has pain  with neck movement.  Has paraspinal and trapezius muscle tenderness on right side.  Pulmonary/Chest: Effort normal and breath sounds normal. No respiratory distress. She has no wheezes.  Abdominal: Soft. Bowel sounds are normal. She exhibits no distension. There is no tenderness.  Musculoskeletal:  Trace edema on b/l LE's.   Lymphadenopathy:    She has no cervical adenopathy.  Neurological: She is alert and oriented to person, place, and time.  Skin: She is not diaphoretic.    Filed Vitals:   09/09/15 0845  BP: 137/71  Pulse: 104  Temp: 98.5 F (36.9 C)        Assessment & Plan:  See problem based a&p. 

## 2015-09-10 DIAGNOSIS — G609 Hereditary and idiopathic neuropathy, unspecified: Secondary | ICD-10-CM | POA: Diagnosis not present

## 2015-09-10 LAB — CK: Total CK: 102 U/L (ref 24–173)

## 2015-09-11 DIAGNOSIS — G609 Hereditary and idiopathic neuropathy, unspecified: Secondary | ICD-10-CM | POA: Diagnosis not present

## 2015-09-12 DIAGNOSIS — G609 Hereditary and idiopathic neuropathy, unspecified: Secondary | ICD-10-CM | POA: Diagnosis not present

## 2015-09-13 DIAGNOSIS — G609 Hereditary and idiopathic neuropathy, unspecified: Secondary | ICD-10-CM | POA: Diagnosis not present

## 2015-09-14 DIAGNOSIS — G609 Hereditary and idiopathic neuropathy, unspecified: Secondary | ICD-10-CM | POA: Diagnosis not present

## 2015-09-15 DIAGNOSIS — G609 Hereditary and idiopathic neuropathy, unspecified: Secondary | ICD-10-CM | POA: Diagnosis not present

## 2015-09-16 DIAGNOSIS — G609 Hereditary and idiopathic neuropathy, unspecified: Secondary | ICD-10-CM | POA: Diagnosis not present

## 2015-09-17 DIAGNOSIS — G609 Hereditary and idiopathic neuropathy, unspecified: Secondary | ICD-10-CM | POA: Diagnosis not present

## 2015-09-18 DIAGNOSIS — G609 Hereditary and idiopathic neuropathy, unspecified: Secondary | ICD-10-CM | POA: Diagnosis not present

## 2015-09-19 ENCOUNTER — Other Ambulatory Visit: Payer: Self-pay | Admitting: Internal Medicine

## 2015-09-19 DIAGNOSIS — G609 Hereditary and idiopathic neuropathy, unspecified: Secondary | ICD-10-CM | POA: Diagnosis not present

## 2015-09-20 DIAGNOSIS — G609 Hereditary and idiopathic neuropathy, unspecified: Secondary | ICD-10-CM | POA: Diagnosis not present

## 2015-09-21 DIAGNOSIS — G609 Hereditary and idiopathic neuropathy, unspecified: Secondary | ICD-10-CM | POA: Diagnosis not present

## 2015-09-22 DIAGNOSIS — G609 Hereditary and idiopathic neuropathy, unspecified: Secondary | ICD-10-CM | POA: Diagnosis not present

## 2015-09-23 ENCOUNTER — Ambulatory Visit: Payer: Commercial Managed Care - HMO

## 2015-09-23 DIAGNOSIS — G609 Hereditary and idiopathic neuropathy, unspecified: Secondary | ICD-10-CM | POA: Diagnosis not present

## 2015-09-24 DIAGNOSIS — G609 Hereditary and idiopathic neuropathy, unspecified: Secondary | ICD-10-CM | POA: Diagnosis not present

## 2015-09-25 DIAGNOSIS — G609 Hereditary and idiopathic neuropathy, unspecified: Secondary | ICD-10-CM | POA: Diagnosis not present

## 2015-09-26 DIAGNOSIS — G609 Hereditary and idiopathic neuropathy, unspecified: Secondary | ICD-10-CM | POA: Diagnosis not present

## 2015-09-27 DIAGNOSIS — G609 Hereditary and idiopathic neuropathy, unspecified: Secondary | ICD-10-CM | POA: Diagnosis not present

## 2015-09-28 ENCOUNTER — Ambulatory Visit (HOSPITAL_COMMUNITY)
Admission: RE | Admit: 2015-09-28 | Discharge: 2015-09-28 | Disposition: A | Discharge status: Home / Self Care | Payer: Commercial Managed Care - HMO | Source: Ambulatory Visit | Attending: Internal Medicine | Admitting: Internal Medicine

## 2015-09-28 DIAGNOSIS — M542 Cervicalgia: Secondary | ICD-10-CM | POA: Insufficient documentation

## 2015-09-28 DIAGNOSIS — G609 Hereditary and idiopathic neuropathy, unspecified: Secondary | ICD-10-CM | POA: Diagnosis not present

## 2015-09-28 DIAGNOSIS — M4802 Spinal stenosis, cervical region: Secondary | ICD-10-CM | POA: Diagnosis not present

## 2015-09-29 ENCOUNTER — Ambulatory Visit: Payer: Commercial Managed Care - HMO | Admitting: Dietician

## 2015-09-29 ENCOUNTER — Telehealth: Payer: Self-pay | Admitting: Internal Medicine

## 2015-09-29 ENCOUNTER — Ambulatory Visit: Payer: Commercial Managed Care - HMO | Admitting: Internal Medicine

## 2015-09-29 DIAGNOSIS — M4802 Spinal stenosis, cervical region: Secondary | ICD-10-CM | POA: Insufficient documentation

## 2015-09-29 DIAGNOSIS — G609 Hereditary and idiopathic neuropathy, unspecified: Secondary | ICD-10-CM | POA: Diagnosis not present

## 2015-09-29 NOTE — Telephone Encounter (Signed)
Neck, upper extremity, and upper back pain and stiffness (>1 month) that has not responded well to treatment with prednisone or oral opiate pain medications. CRP elevated to 55.8. Now symptoms are worst specifically in and around her neck and shoulders. MR was obtained and demonstrated significant stenosis and cord compression. These results were discussed in person with the reading radiologist. Based on these findings and failure to improve on medical management tried so far she will be referred to Neurosurgery for further evaluation and management. I spoke with [name] about these results and the plan to have her seen for further management.  Collier Salina, MD 09/29/2015, 10:18 AM

## 2015-09-30 DIAGNOSIS — G609 Hereditary and idiopathic neuropathy, unspecified: Secondary | ICD-10-CM | POA: Diagnosis not present

## 2015-10-01 ENCOUNTER — Ambulatory Visit: Payer: Commercial Managed Care - HMO | Admitting: Dietician

## 2015-10-01 ENCOUNTER — Ambulatory Visit: Payer: Commercial Managed Care - HMO | Admitting: Internal Medicine

## 2015-10-01 ENCOUNTER — Encounter: Payer: Self-pay | Admitting: Internal Medicine

## 2015-10-01 DIAGNOSIS — G609 Hereditary and idiopathic neuropathy, unspecified: Secondary | ICD-10-CM | POA: Diagnosis not present

## 2015-10-02 DIAGNOSIS — G609 Hereditary and idiopathic neuropathy, unspecified: Secondary | ICD-10-CM | POA: Diagnosis not present

## 2015-10-03 DIAGNOSIS — G609 Hereditary and idiopathic neuropathy, unspecified: Secondary | ICD-10-CM | POA: Diagnosis not present

## 2015-10-04 DIAGNOSIS — G609 Hereditary and idiopathic neuropathy, unspecified: Secondary | ICD-10-CM | POA: Diagnosis not present

## 2015-10-05 DIAGNOSIS — G609 Hereditary and idiopathic neuropathy, unspecified: Secondary | ICD-10-CM | POA: Diagnosis not present

## 2015-10-06 ENCOUNTER — Other Ambulatory Visit: Payer: Self-pay

## 2015-10-06 ENCOUNTER — Other Ambulatory Visit: Payer: Self-pay | Admitting: Neurological Surgery

## 2015-10-06 ENCOUNTER — Ambulatory Visit: Payer: Commercial Managed Care - HMO | Attending: Physical Therapy | Admitting: Physical Therapy

## 2015-10-06 DIAGNOSIS — M4802 Spinal stenosis, cervical region: Secondary | ICD-10-CM | POA: Diagnosis not present

## 2015-10-06 DIAGNOSIS — G609 Hereditary and idiopathic neuropathy, unspecified: Secondary | ICD-10-CM | POA: Diagnosis not present

## 2015-10-06 NOTE — Telephone Encounter (Signed)
Pt requesting metformin to be filled @ [facility].

## 2015-10-06 NOTE — Addendum Note (Signed)
Addended by: Hulan Fray on: 10/06/2015 08:25 PM   Modules accepted: Orders

## 2015-10-07 DIAGNOSIS — G609 Hereditary and idiopathic neuropathy, unspecified: Secondary | ICD-10-CM | POA: Diagnosis not present

## 2015-10-07 MED ORDER — METFORMIN HCL 1000 MG PO TABS: 1000.0000 mg | ORAL_TABLET | Freq: Two times a day (BID) | ORAL | Status: DC

## 2015-10-08 DIAGNOSIS — G609 Hereditary and idiopathic neuropathy, unspecified: Secondary | ICD-10-CM | POA: Diagnosis not present

## 2015-10-09 DIAGNOSIS — G609 Hereditary and idiopathic neuropathy, unspecified: Secondary | ICD-10-CM | POA: Diagnosis not present

## 2015-10-10 DIAGNOSIS — G609 Hereditary and idiopathic neuropathy, unspecified: Secondary | ICD-10-CM | POA: Diagnosis not present

## 2015-10-11 DIAGNOSIS — G609 Hereditary and idiopathic neuropathy, unspecified: Secondary | ICD-10-CM | POA: Diagnosis not present

## 2015-10-12 DIAGNOSIS — G609 Hereditary and idiopathic neuropathy, unspecified: Secondary | ICD-10-CM | POA: Diagnosis not present

## 2015-10-13 ENCOUNTER — Other Ambulatory Visit: Payer: Self-pay

## 2015-10-13 DIAGNOSIS — H401133 Primary open-angle glaucoma, bilateral, severe stage: Secondary | ICD-10-CM | POA: Diagnosis not present

## 2015-10-13 DIAGNOSIS — M542 Cervicalgia: Secondary | ICD-10-CM

## 2015-10-13 DIAGNOSIS — Z961 Presence of intraocular lens: Secondary | ICD-10-CM | POA: Diagnosis not present

## 2015-10-13 DIAGNOSIS — G609 Hereditary and idiopathic neuropathy, unspecified: Secondary | ICD-10-CM | POA: Diagnosis not present

## 2015-10-13 NOTE — Telephone Encounter (Signed)
Pt requesting hydrocodone to be filled.  °

## 2015-10-13 NOTE — Addendum Note (Signed)
Addended by: Alinda Dooms A on: 10/13/2015 03:06 PM   Modules accepted: SmartSet

## 2015-10-14 ENCOUNTER — Other Ambulatory Visit (HOSPITAL_COMMUNITY): Payer: Self-pay | Admitting: *Deleted

## 2015-10-14 DIAGNOSIS — G609 Hereditary and idiopathic neuropathy, unspecified: Secondary | ICD-10-CM | POA: Diagnosis not present

## 2015-10-14 MED ORDER — HYDROCODONE-ACETAMINOPHEN 5-325 MG PO TABS: 1.0000 | ORAL_TABLET | Freq: Four times a day (QID) | ORAL | Status: DC | PRN

## 2015-10-14 NOTE — Telephone Encounter (Signed)
Message left on pt's recorder that rx is ready for pick-up.[name], [name]4/19/201710:02 AM

## 2015-10-14 NOTE — Pre-Procedure Instructions (Signed)
EUSTOLIA QUINTERO  10/14/2015      [facility] [address] - [place], [address] AT [address] [place] [address] Phone: [phone] Fax: [phone]    Your procedure is scheduled on Thursday, October 22, 2015 at 10:35 AM.  Report to [facility] Entrance "A" Admitting Office at 8:30 AM.  Call this number if you have problems the morning of surgery: [phone]  Any questions prior to day of surgery, please call [phone] between 8 & 4 PM.   Remember:  Do not eat food or drink liquids after midnight Wednesday, 10/21/15.  Take these medicines the morning of surgery with A SIP OF WATER: Carvedilol (Coreg), Gabapentin (Neurontin), Pantoprazole (Protonix), eye drops, Hydrocodone - if needed  Stop Aspirin as of today.    How to Manage Your Diabetes Before Surgery   Why is it important to control my blood sugar before and after surgery?   Improving blood sugar levels before and after surgery helps healing and can limit problems.  A way of improving blood sugar control is eating a healthy diet by:  - Eating less sugar and carbohydrates  - Increasing activity/exercise  - Talk with your doctor about reaching your blood sugar goals  High blood sugars (greater than 180 mg/dL) can raise your risk of infections and slow down your recovery so you will need to focus on controlling your diabetes during the weeks before surgery.  Make sure that the doctor who takes care of your diabetes knows about your planned surgery including the date and location.  How do I manage my blood sugars before surgery?   Check your blood sugar at least 4 times a day, 2 days before surgery to make sure that they are not too high or low.  Check your blood sugar the morning of your surgery when you wake up and every 2 hours until you get to the Short-Stay unit.  Treat a low blood sugar (less than 70 mg/dL) with 1/2 cup of clear juice  (cranberry or apple), 4 glucose tablets, OR glucose gel.  Recheck blood sugar in 15 minutes after treatment (to make sure it is greater than 70 mg/dL).  If blood sugar is not greater than 70 mg/dL on re-check, call [phone] for further instructions.   Report your blood sugar to the Short-Stay nurse when you get to Short-Stay.  References:  [facility], 2007 "How to Manage your Diabetes Before and After Surgery".  What do I do about my diabetes medications?   Do not take oral diabetes medicines (pills) the morning of surgery.  THE NIGHT BEFORE SURGERY, take 22 units of Lantus Insulin.   Do not wear jewelry, make-up or nail polish.  Do not wear lotions, powders, or perfumes.  You may wear deodorant.  Do not shave 48 hours prior to surgery.    Do not bring valuables to the hospital.  [facility] is not responsible for any belongings or valuables.  Contacts, dentures or bridgework may not be worn into surgery.  Leave your suitcase in the car.  After surgery it may be brought to your room.  For patients admitted to the hospital, discharge time will be determined by your treatment team.  Special instructions:  [place] - Preparing for Surgery  Before surgery, you can play an important role.  Because skin is not sterile, your skin needs to be as free of germs  as possible.  You can reduce the number of germs on you skin by washing with CHG (chlorahexidine gluconate) soap before surgery.  CHG is an antiseptic cleaner which kills germs and bonds with the skin to continue killing germs even after washing.  Please DO NOT use if you have an allergy to CHG or antibacterial soaps.  If your skin becomes reddened/irritated stop using the CHG and inform your nurse when you arrive at Short Stay.  Do not shave (including legs and underarms) for at least 48 hours prior to the first CHG shower.  You may shave your face.  Please follow these instructions carefully:   1.   Shower with CHG Soap the night before surgery and the                                morning of Surgery.  2.  If you choose to wash your hair, wash your hair first as usual with your       normal shampoo.  3.  After you shampoo, rinse your hair and body thoroughly to remove the                      Shampoo.  4.  Use CHG as you would any other liquid soap.  You can apply chg directly       to the skin and wash gently with scrungie or a clean washcloth.  5.  Apply the CHG Soap to your body ONLY FROM THE NECK DOWN.        Do not use on open wounds or open sores.  Avoid contact with your eyes, ears, mouth and genitals (private parts).  Wash genitals (private parts) with your normal soap.  6.  Wash thoroughly, paying special attention to the area where your surgery        will be performed.  7.  Thoroughly rinse your body with warm water from the neck down.  8.  DO NOT shower/wash with your normal soap after using and rinsing off       the CHG Soap.  9.  Pat yourself dry with a clean towel.            10.  Wear clean pajamas.            11.  Place clean sheets on your bed the night of your first shower and do not        sleep with pets.  Day of Surgery  Do not apply any lotions the morning of surgery.  Please wear clean clothes to the hospital.   Please read over the following fact sheets that you were given. Pain Booklet, Coughing and Deep Breathing, Blood Transfusion Information, MRSA Information and Surgical Site Infection Prevention

## 2015-10-15 ENCOUNTER — Encounter (HOSPITAL_COMMUNITY)
Admission: RE | Admit: 2015-10-15 | Discharge: 2015-10-15 | Disposition: A | Discharge status: Home / Self Care | Payer: Commercial Managed Care - HMO | Source: Ambulatory Visit | Attending: Neurological Surgery | Admitting: Neurological Surgery

## 2015-10-15 DIAGNOSIS — I509 Heart failure, unspecified: Secondary | ICD-10-CM | POA: Insufficient documentation

## 2015-10-15 DIAGNOSIS — Z87891 Personal history of nicotine dependence: Secondary | ICD-10-CM | POA: Insufficient documentation

## 2015-10-15 DIAGNOSIS — K219 Gastro-esophageal reflux disease without esophagitis: Secondary | ICD-10-CM | POA: Insufficient documentation

## 2015-10-15 DIAGNOSIS — Z794 Long term (current) use of insulin: Secondary | ICD-10-CM | POA: Diagnosis not present

## 2015-10-15 DIAGNOSIS — Z7982 Long term (current) use of aspirin: Secondary | ICD-10-CM | POA: Diagnosis not present

## 2015-10-15 DIAGNOSIS — Z79899 Other long term (current) drug therapy: Secondary | ICD-10-CM | POA: Insufficient documentation

## 2015-10-15 DIAGNOSIS — I110 Hypertensive heart disease with heart failure: Secondary | ICD-10-CM | POA: Insufficient documentation

## 2015-10-15 DIAGNOSIS — E785 Hyperlipidemia, unspecified: Secondary | ICD-10-CM | POA: Insufficient documentation

## 2015-10-15 DIAGNOSIS — Z0183 Encounter for blood typing: Secondary | ICD-10-CM | POA: Diagnosis not present

## 2015-10-15 DIAGNOSIS — M4802 Spinal stenosis, cervical region: Secondary | ICD-10-CM | POA: Diagnosis not present

## 2015-10-15 DIAGNOSIS — E119 Type 2 diabetes mellitus without complications: Secondary | ICD-10-CM | POA: Diagnosis not present

## 2015-10-15 DIAGNOSIS — I2510 Atherosclerotic heart disease of native coronary artery without angina pectoris: Secondary | ICD-10-CM | POA: Diagnosis not present

## 2015-10-15 DIAGNOSIS — G609 Hereditary and idiopathic neuropathy, unspecified: Secondary | ICD-10-CM | POA: Diagnosis not present

## 2015-10-15 DIAGNOSIS — Z01812 Encounter for preprocedural laboratory examination: Secondary | ICD-10-CM | POA: Diagnosis not present

## 2015-10-15 DIAGNOSIS — Z01818 Encounter for other preprocedural examination: Secondary | ICD-10-CM | POA: Diagnosis not present

## 2015-10-15 LAB — CBC WITH DIFFERENTIAL/PLATELET
BASOS ABS: 0 10*3/uL (ref 0.0–0.1)
Basophils Relative: 0 %
EOS ABS: 0.3 10*3/uL (ref 0.0–0.7)
EOS PCT: 3 %
HCT: 35 % — ABNORMAL LOW (ref 36.0–46.0)
Hemoglobin: 10.9 g/dL — ABNORMAL LOW (ref 12.0–15.0)
LYMPHS PCT: 31 %
Lymphs Abs: 3.2 10*3/uL (ref 0.7–4.0)
MCH: 27.5 pg (ref 26.0–34.0)
MCHC: 31.1 g/dL (ref 30.0–36.0)
MCV: 88.2 fL (ref 78.0–100.0)
Monocytes Absolute: 0.6 10*3/uL (ref 0.1–1.0)
Monocytes Relative: 6 %
Neutro Abs: 6.2 10*3/uL (ref 1.7–7.7)
Neutrophils Relative %: 60 %
PLATELETS: 269 10*3/uL (ref 150–400)
RBC: 3.97 MIL/uL (ref 3.87–5.11)
RDW: 14.8 % (ref 11.5–15.5)
WBC: 10.4 10*3/uL (ref 4.0–10.5)

## 2015-10-15 LAB — PROTIME-INR
INR: 0.99 (ref 0.00–1.49)
Prothrombin Time: 13.3 seconds (ref 11.6–15.2)

## 2015-10-15 LAB — SURGICAL PCR SCREEN
MRSA, PCR: NEGATIVE
Staphylococcus aureus: NEGATIVE

## 2015-10-15 LAB — TYPE AND SCREEN
ABO/RH(D): A POS
ANTIBODY SCREEN: NEGATIVE

## 2015-10-15 LAB — BASIC METABOLIC PANEL
Anion gap: 13 (ref 5–15)
BUN: 18 mg/dL (ref 6–20)
CALCIUM: 9.4 mg/dL (ref 8.9–10.3)
CO2: 20 mmol/L — ABNORMAL LOW (ref 22–32)
CREATININE: 0.97 mg/dL (ref 0.44–1.00)
Chloride: 105 mmol/L (ref 101–111)
GFR calc Af Amer: >60 mL/min (ref >60)
GLUCOSE: 229 mg/dL — AB (ref 65–99)
POTASSIUM: 4.3 mmol/L (ref 3.5–5.1)
SODIUM: 138 mmol/L (ref 135–145)

## 2015-10-15 LAB — ABO/RH: ABO/RH(D): A POS

## 2015-10-15 LAB — GLUCOSE, CAPILLARY: Glucose-Capillary: 188 mg/dL — ABNORMAL HIGH (ref 65–99)

## 2015-10-15 NOTE — Progress Notes (Addendum)
Anesthesia Chart Review:  Pt is a 55 year old female scheduled for C3-7 posterior cervical fusion with lateral mass fixation, laminectomy C3-7 on 10/22/2015 with Dr. Ronnald Ramp.   Pt receives primary care at the [facility]. Cardiologist is Dr. Burt Knack, last office visit 05/29/15 with Lyda Jester, PA.   PMH includes:  CAD, CHF, HTN, DM, hyperlipidemia, GERD. Former smoker. BMI 42  Medications include: ASA, carvedilol, cosopt ophthalmic, iron, lasix, novolog, lantus, lisinopril, metformin, protonix, pravastatin  Preoperative labs reviewed.  Glucose 229, hgbA1c 9.7.   Chest x-ray 04/30/15 reviewed. No active cardiopulmonary disease.   EKG 05/29/15: NSR. Nonspecific T wave abnormality. Prolonged QT  Echo 05/01/15:  - Left ventricle: The cavity size was normal. Wall thickness was increased in a pattern of mild LVH. Systolic function was normal. The estimated ejection fraction was in the range of 55% to 60%. Wall motion was normal; there were no regional wall motion abnormalities. Doppler parameters are consistent with abnormal left ventricular relaxation (grade 1 diastolic dysfunction).  Nuclear stress test 05/01/15:  1. No evidence of inducible myocardial ischemia. Inferior wall attenuation likely relates to diaphragmatic attenuation. Apical attenuation may relate to apical thinning or breast soft tissue attenuation. 2. Normal left ventricular wall motion. 3. Left ventricular ejection fraction 53% 4. Low-risk stress test findings  Carotid duplex 09/07/12: No significant extracranial carotid artery stenosis demonstrated  If no changes, I anticipate pt can proceed with surgery as scheduled.   Willeen Cass, FNP-BC [facility] Short Stay Surgical Center/Anesthesiology Phone: [phone] 10/15/2015 4:24 PM

## 2015-10-15 NOTE — Pre-Procedure Instructions (Signed)
[name]  10/15/2015      [facility] [address] - [address], [address] AT [address] [place] [address] Phone: [phone] Fax: [phone]    Your procedure is scheduled on Thursday, October 22, 2015 at 10:35 AM.  Report to [facility] Entrance "A" Admitting Office at 8:30 AM.  Call this number if you have problems the morning of surgery: [phone]  Any questions prior to day of surgery, please call [phone] between 8 & 4 PM.   Remember:  Do not eat food or drink liquids after midnight Wednesday, 10/21/15.  Take these medicines the morning of surgery with A SIP OF WATER: Carvedilol (Coreg), Gabapentin (Neurontin), Pantoprazole (Protonix), eye drops, Hydrocodone - if needed  Stop Aspirin as of today.    How to Manage Your Diabetes Before Surgery   Why is it important to control my blood sugar before and after surgery?   Improving blood sugar levels before and after surgery helps healing and can limit problems.  A way of improving blood sugar control is eating a healthy diet by:  - Eating less sugar and carbohydrates  - Increasing activity/exercise  - Talk with your doctor about reaching your blood sugar goals  High blood sugars (greater than 180 mg/dL) can raise your risk of infections and slow down your recovery so you will need to focus on controlling your diabetes during the weeks before surgery.  Make sure that the doctor who takes care of your diabetes knows about your planned surgery including the date and location.  How do I manage my blood sugars before surgery?   Check your blood sugar at least 4 times a day, 2 days before surgery to make sure that they are not too high or low.  Check your blood sugar the morning of your surgery when you wake up and every 2 hours until you get to the Short-Stay unit.  Treat a low blood sugar (less than 70 mg/dL) with 1/2 cup of clear juice  (cranberry or apple), 4 glucose tablets, OR glucose gel.  Recheck blood sugar in 15 minutes after treatment (to make sure it is greater than 70 mg/dL).  If blood sugar is not greater than 70 mg/dL on re-check, call [phone] for further instructions.   Report your blood sugar to the Short-Stay nurse when you get to Short-Stay.  References:  [facility], 2007 "How to Manage your Diabetes Before and After Surgery".  What do I do about my diabetes medications?   Do not take oral diabetes medicines (pills) the morning of surgery.  THE NIGHT BEFORE SURGERY, take 22 units of Lantus Insulin.   Do not wear jewelry, make-up or nail polish.  Do not wear lotions, powders, or perfumes.  You may wear deodorant.  Do not shave 48 hours prior to surgery.    Do not bring valuables to the hospital.  [facility] is not responsible for any belongings or valuables.  Contacts, dentures or bridgework may not be worn into surgery.  Leave your suitcase in the car.  After surgery it may be brought to your room.  For patients admitted to the hospital, discharge time will be determined by your treatment team.  Special instructions:  [place] - Preparing for Surgery  Before surgery, you can play an important role.  Because skin is not sterile, your skin needs to be as free of germs  as possible.  You can reduce the number of germs on you skin by washing with CHG (chlorahexidine gluconate) soap before surgery.  CHG is an antiseptic cleaner which kills germs and bonds with the skin to continue killing germs even after washing.  Please DO NOT use if you have an allergy to CHG or antibacterial soaps.  If your skin becomes reddened/irritated stop using the CHG and inform your nurse when you arrive at Short Stay.  Do not shave (including legs and underarms) for at least 48 hours prior to the first CHG shower.  You may shave your face.  Please follow these instructions carefully:   1.   Shower with CHG Soap the night before surgery and the                                morning of Surgery.  2.  If you choose to wash your hair, wash your hair first as usual with your       normal shampoo.  3.  After you shampoo, rinse your hair and body thoroughly to remove the                      Shampoo.  4.  Use CHG as you would any other liquid soap.  You can apply chg directly       to the skin and wash gently with scrungie or a clean washcloth.  5.  Apply the CHG Soap to your body ONLY FROM THE NECK DOWN.        Do not use on open wounds or open sores.  Avoid contact with your eyes, ears, mouth and genitals (private parts).  Wash genitals (private parts) with your normal soap.  6.  Wash thoroughly, paying special attention to the area where your surgery        will be performed.  7.  Thoroughly rinse your body with warm water from the neck down.  8.  DO NOT shower/wash with your normal soap after using and rinsing off       the CHG Soap.  9.  Pat yourself dry with a clean towel.            10.  Wear clean pajamas.            11.  Place clean sheets on your bed the night of your first shower and do not        sleep with pets.  Day of Surgery  Do not apply any lotions the morning of surgery.  Please wear clean clothes to the hospital.   Please read over the following fact sheets that you were given. Pain Booklet, Coughing and Deep Breathing, Blood Transfusion Information, MRSA Information and Surgical Site Infection Prevention

## 2015-10-15 NOTE — Progress Notes (Signed)
Cardiologist Dr Sallyanne Kuster.  Last seen about 8 months ago  Stress test 05/01/15 in EPIC Echo 05/01/15 in EPIC Heart Cath 01/24/12 in EPIC  EKG 05/29/15 in EPIC CXR 04/30/15 in EPIC   PCP Dr Vernelle Emerald, [facility] outpatient

## 2015-10-16 DIAGNOSIS — G609 Hereditary and idiopathic neuropathy, unspecified: Secondary | ICD-10-CM | POA: Diagnosis not present

## 2015-10-16 LAB — HEMOGLOBIN A1C
Hgb A1c MFr Bld: 9.7 % — ABNORMAL HIGH (ref 4.8–5.6)
MEAN PLASMA GLUCOSE: 232 mg/dL

## 2015-10-17 DIAGNOSIS — G609 Hereditary and idiopathic neuropathy, unspecified: Secondary | ICD-10-CM | POA: Diagnosis not present

## 2015-10-18 DIAGNOSIS — G609 Hereditary and idiopathic neuropathy, unspecified: Secondary | ICD-10-CM | POA: Diagnosis not present

## 2015-10-19 DIAGNOSIS — G609 Hereditary and idiopathic neuropathy, unspecified: Secondary | ICD-10-CM | POA: Diagnosis not present

## 2015-10-20 DIAGNOSIS — G609 Hereditary and idiopathic neuropathy, unspecified: Secondary | ICD-10-CM | POA: Diagnosis not present

## 2015-10-21 DIAGNOSIS — G609 Hereditary and idiopathic neuropathy, unspecified: Secondary | ICD-10-CM | POA: Diagnosis not present

## 2015-10-21 MED ORDER — DEXTROSE 5 % IV SOLN
3.0000 g | INTRAVENOUS | Status: AC
  Administered 2015-10-22: 3 g via INTRAVENOUS
  Filled 2015-10-21: qty 3000

## 2015-10-21 MED ORDER — DEXAMETHASONE SODIUM PHOSPHATE 10 MG/ML IJ SOLN
10.0000 mg | INTRAMUSCULAR | Status: AC
  Administered 2015-10-22: 10 mg via INTRAVENOUS
  Filled 2015-10-21: qty 1

## 2015-10-22 ENCOUNTER — Encounter (HOSPITAL_COMMUNITY)
Admission: RE | Disposition: A | Discharge status: Home / Self Care | Payer: Self-pay | Source: Ambulatory Visit | Attending: Neurological Surgery

## 2015-10-22 ENCOUNTER — Encounter (HOSPITAL_COMMUNITY): Payer: Self-pay | Admitting: Neurological Surgery

## 2015-10-22 ENCOUNTER — Inpatient Hospital Stay (HOSPITAL_COMMUNITY): Payer: Commercial Managed Care - HMO | Admitting: Emergency Medicine

## 2015-10-22 ENCOUNTER — Inpatient Hospital Stay (HOSPITAL_COMMUNITY): Payer: Commercial Managed Care - HMO

## 2015-10-22 ENCOUNTER — Inpatient Hospital Stay (HOSPITAL_COMMUNITY)
Admission: RE | Admit: 2015-10-22 | Discharge: 2015-10-27 | DRG: 472 | Disposition: A | Discharge status: Home / Self Care | Payer: Commercial Managed Care - HMO | Source: Ambulatory Visit | Attending: Neurological Surgery | Admitting: Neurological Surgery

## 2015-10-22 ENCOUNTER — Inpatient Hospital Stay (HOSPITAL_COMMUNITY): Payer: Commercial Managed Care - HMO | Admitting: Certified Registered Nurse Anesthetist

## 2015-10-22 DIAGNOSIS — M4802 Spinal stenosis, cervical region: Principal | ICD-10-CM | POA: Diagnosis present

## 2015-10-22 DIAGNOSIS — I110 Hypertensive heart disease with heart failure: Secondary | ICD-10-CM | POA: Diagnosis not present

## 2015-10-22 DIAGNOSIS — E785 Hyperlipidemia, unspecified: Secondary | ICD-10-CM | POA: Diagnosis present

## 2015-10-22 DIAGNOSIS — M4322 Fusion of spine, cervical region: Secondary | ICD-10-CM | POA: Diagnosis not present

## 2015-10-22 DIAGNOSIS — E11319 Type 2 diabetes mellitus with unspecified diabetic retinopathy without macular edema: Secondary | ICD-10-CM | POA: Diagnosis not present

## 2015-10-22 DIAGNOSIS — Z794 Long term (current) use of insulin: Secondary | ICD-10-CM | POA: Diagnosis not present

## 2015-10-22 DIAGNOSIS — I509 Heart failure, unspecified: Secondary | ICD-10-CM | POA: Diagnosis present

## 2015-10-22 DIAGNOSIS — Z87891 Personal history of nicotine dependence: Secondary | ICD-10-CM | POA: Diagnosis not present

## 2015-10-22 DIAGNOSIS — M542 Cervicalgia: Secondary | ICD-10-CM

## 2015-10-22 DIAGNOSIS — Z9849 Cataract extraction status, unspecified eye: Secondary | ICD-10-CM | POA: Diagnosis not present

## 2015-10-22 DIAGNOSIS — K219 Gastro-esophageal reflux disease without esophagitis: Secondary | ICD-10-CM | POA: Diagnosis not present

## 2015-10-22 DIAGNOSIS — I2510 Atherosclerotic heart disease of native coronary artery without angina pectoris: Secondary | ICD-10-CM | POA: Diagnosis present

## 2015-10-22 DIAGNOSIS — G609 Hereditary and idiopathic neuropathy, unspecified: Secondary | ICD-10-CM | POA: Diagnosis not present

## 2015-10-22 DIAGNOSIS — H409 Unspecified glaucoma: Secondary | ICD-10-CM | POA: Diagnosis present

## 2015-10-22 DIAGNOSIS — M7138 Other bursal cyst, other site: Secondary | ICD-10-CM | POA: Diagnosis present

## 2015-10-22 DIAGNOSIS — Z419 Encounter for procedure for purposes other than remedying health state, unspecified: Secondary | ICD-10-CM

## 2015-10-22 DIAGNOSIS — M4712 Other spondylosis with myelopathy, cervical region: Secondary | ICD-10-CM | POA: Diagnosis present

## 2015-10-22 DIAGNOSIS — Z8249 Family history of ischemic heart disease and other diseases of the circulatory system: Secondary | ICD-10-CM

## 2015-10-22 HISTORY — PX: POSTERIOR CERVICAL FUSION/FORAMINOTOMY: SHX5038

## 2015-10-22 LAB — GLUCOSE, CAPILLARY
Glucose-Capillary: 111 mg/dL — ABNORMAL HIGH (ref 65–99)
Glucose-Capillary: 126 mg/dL — ABNORMAL HIGH (ref 65–99)
Glucose-Capillary: 357 mg/dL — ABNORMAL HIGH (ref 65–99)

## 2015-10-22 SURGERY — POSTERIOR CERVICAL FUSION/FORAMINOTOMY LEVEL 4
Anesthesia: General | Site: Spine Cervical

## 2015-10-22 MED ORDER — EPHEDRINE 5 MG/ML INJ
INTRAVENOUS | Status: AC
  Filled 2015-10-22: qty 10

## 2015-10-22 MED ORDER — LIDOCAINE HCL (CARDIAC) 20 MG/ML IV SOLN
INTRAVENOUS | Status: DC | PRN
  Administered 2015-10-22: 80 mg via INTRAVENOUS

## 2015-10-22 MED ORDER — SODIUM CHLORIDE 0.9% FLUSH
3.0000 mL | Freq: Two times a day (BID) | INTRAVENOUS | Status: DC
  Administered 2015-10-24 – 2015-10-26 (×4): 3 mL via INTRAVENOUS

## 2015-10-22 MED ORDER — AMITRIPTYLINE HCL 25 MG PO TABS
25.0000 mg | ORAL_TABLET | Freq: Every day | ORAL | Status: DC
  Administered 2015-10-22 – 2015-10-26 (×5): 25 mg via ORAL
  Filled 2015-10-22 (×5): qty 1

## 2015-10-22 MED ORDER — FENTANYL CITRATE (PF) 100 MCG/2ML IJ SOLN
INTRAMUSCULAR | Status: DC | PRN
  Administered 2015-10-22 (×2): 50 ug via INTRAVENOUS
  Administered 2015-10-22 (×2): 100 ug via INTRAVENOUS
  Administered 2015-10-22: 50 ug via INTRAVENOUS
  Administered 2015-10-22: 100 ug via INTRAVENOUS
  Administered 2015-10-22: 50 ug via INTRAVENOUS

## 2015-10-22 MED ORDER — MENTHOL 3 MG MT LOZG: 1.0000 | LOZENGE | OROMUCOSAL | Status: DC | PRN

## 2015-10-22 MED ORDER — MIDAZOLAM HCL 2 MG/2ML IJ SOLN
INTRAMUSCULAR | Status: AC
  Filled 2015-10-22: qty 2

## 2015-10-22 MED ORDER — ONDANSETRON HCL 4 MG/2ML IJ SOLN
INTRAMUSCULAR | Status: AC
  Filled 2015-10-22: qty 2

## 2015-10-22 MED ORDER — ACETAMINOPHEN 160 MG/5ML PO SOLN
325.0000 mg | ORAL | Status: DC | PRN
Start: 2015-10-22 — End: 2015-10-22

## 2015-10-22 MED ORDER — ACETAMINOPHEN 325 MG PO TABS
325.0000 mg | ORAL_TABLET | ORAL | Status: DC | PRN
Start: 2015-10-22 — End: 2015-10-22

## 2015-10-22 MED ORDER — 0.9 % SODIUM CHLORIDE (POUR BTL) OPTIME
TOPICAL | Status: DC | PRN
  Administered 2015-10-22: 1000 mL

## 2015-10-22 MED ORDER — HYDROMORPHONE HCL 1 MG/ML IJ SOLN
0.2500 mg | INTRAMUSCULAR | Status: DC | PRN
  Administered 2015-10-22 (×4): 0.5 mg via INTRAVENOUS

## 2015-10-22 MED ORDER — ACETAMINOPHEN 325 MG PO TABS: 650.0000 mg | ORAL_TABLET | ORAL | Status: DC | PRN

## 2015-10-22 MED ORDER — MIDAZOLAM HCL 5 MG/5ML IJ SOLN
INTRAMUSCULAR | Status: DC | PRN
  Administered 2015-10-22: 2 mg via INTRAVENOUS

## 2015-10-22 MED ORDER — CARVEDILOL 6.25 MG PO TABS
6.2500 mg | ORAL_TABLET | Freq: Two times a day (BID) | ORAL | Status: DC
  Administered 2015-10-22 – 2015-10-27 (×10): 6.25 mg via ORAL
  Filled 2015-10-22 (×11): qty 1

## 2015-10-22 MED ORDER — SODIUM CHLORIDE 0.9 % IV SOLN: 250.0000 mL | INTRAVENOUS | Status: DC

## 2015-10-22 MED ORDER — FUROSEMIDE 20 MG PO TABS
20.0000 mg | ORAL_TABLET | Freq: Every day | ORAL | Status: DC
  Administered 2015-10-23 – 2015-10-27 (×5): 20 mg via ORAL
  Filled 2015-10-22 (×5): qty 1

## 2015-10-22 MED ORDER — MORPHINE SULFATE (PF) 2 MG/ML IV SOLN
1.0000 mg | INTRAVENOUS | Status: DC | PRN
  Administered 2015-10-22 – 2015-10-24 (×6): 2 mg via INTRAVENOUS
  Filled 2015-10-22 (×6): qty 1

## 2015-10-22 MED ORDER — POTASSIUM CHLORIDE IN NACL 20-0.9 MEQ/L-% IV SOLN
INTRAVENOUS | Status: DC
  Administered 2015-10-22: 22:00:00 via INTRAVENOUS
  Filled 2015-10-22 (×10): qty 1000

## 2015-10-22 MED ORDER — FERROUS SULFATE 325 (65 FE) MG PO TABS
325.0000 mg | ORAL_TABLET | Freq: Every day | ORAL | Status: DC
  Administered 2015-10-23 – 2015-10-27 (×5): 325 mg via ORAL
  Filled 2015-10-22 (×5): qty 1

## 2015-10-22 MED ORDER — LABETALOL HCL 5 MG/ML IV SOLN
5.0000 mg | INTRAVENOUS | Status: DC | PRN
  Administered 2015-10-22 (×2): 5 mg via INTRAVENOUS

## 2015-10-22 MED ORDER — LIDOCAINE 2% (20 MG/ML) 5 ML SYRINGE
INTRAMUSCULAR | Status: AC
  Filled 2015-10-22: qty 5

## 2015-10-22 MED ORDER — PHENOL 1.4 % MT LIQD: 1.0000 | OROMUCOSAL | Status: DC | PRN

## 2015-10-22 MED ORDER — CEFAZOLIN SODIUM 1-5 GM-% IV SOLN
1.0000 g | Freq: Three times a day (TID) | INTRAVENOUS | Status: AC
  Administered 2015-10-22 – 2015-10-23 (×2): 1 g via INTRAVENOUS
  Filled 2015-10-22 (×2): qty 50

## 2015-10-22 MED ORDER — THROMBIN 20000 UNITS EX SOLR
CUTANEOUS | Status: DC | PRN
  Administered 2015-10-22: 20 mL via TOPICAL

## 2015-10-22 MED ORDER — PANTOPRAZOLE SODIUM 40 MG PO TBEC
40.0000 mg | DELAYED_RELEASE_TABLET | Freq: Every day | ORAL | Status: DC
  Administered 2015-10-23 – 2015-10-27 (×5): 40 mg via ORAL
  Filled 2015-10-22 (×5): qty 1

## 2015-10-22 MED ORDER — SUGAMMADEX SODIUM 500 MG/5ML IV SOLN
INTRAVENOUS | Status: AC
  Filled 2015-10-22: qty 5

## 2015-10-22 MED ORDER — PROPOFOL 10 MG/ML IV BOLUS
INTRAVENOUS | Status: AC
  Filled 2015-10-22: qty 40

## 2015-10-22 MED ORDER — GABAPENTIN 300 MG PO CAPS
300.0000 mg | ORAL_CAPSULE | Freq: Three times a day (TID) | ORAL | Status: DC
  Administered 2015-10-22 – 2015-10-27 (×14): 300 mg via ORAL
  Filled 2015-10-22 (×14): qty 1

## 2015-10-22 MED ORDER — LISINOPRIL 10 MG PO TABS
10.0000 mg | ORAL_TABLET | Freq: Every day | ORAL | Status: DC
  Administered 2015-10-23 – 2015-10-27 (×5): 10 mg via ORAL
  Filled 2015-10-22 (×5): qty 1

## 2015-10-22 MED ORDER — INSULIN ASPART 100 UNIT/ML ~~LOC~~ SOLN
0.0000 [IU] | Freq: Every day | SUBCUTANEOUS | Status: DC
  Administered 2015-10-22: 5 [IU] via SUBCUTANEOUS
  Administered 2015-10-23 – 2015-10-25 (×3): 2 [IU] via SUBCUTANEOUS

## 2015-10-22 MED ORDER — ONDANSETRON HCL 4 MG/2ML IJ SOLN: 4.0000 mg | INTRAMUSCULAR | Status: DC | PRN

## 2015-10-22 MED ORDER — OXYCODONE HCL 5 MG PO TABS
5.0000 mg | ORAL_TABLET | Freq: Once | ORAL | Status: AC | PRN
  Administered 2015-10-22: 5 mg via ORAL

## 2015-10-22 MED ORDER — LATANOPROST 0.005 % OP SOLN
1.0000 [drp] | Freq: Every day | OPHTHALMIC | Status: DC
  Administered 2015-10-22: 1 [drp] via OPHTHALMIC
  Filled 2015-10-22: qty 2.5

## 2015-10-22 MED ORDER — METFORMIN HCL 500 MG PO TABS
1000.0000 mg | ORAL_TABLET | Freq: Two times a day (BID) | ORAL | Status: DC
  Administered 2015-10-23 – 2015-10-27 (×9): 1000 mg via ORAL
  Filled 2015-10-22 (×9): qty 2

## 2015-10-22 MED ORDER — CYCLOBENZAPRINE HCL 10 MG PO TABS
10.0000 mg | ORAL_TABLET | Freq: Three times a day (TID) | ORAL | Status: DC | PRN
  Administered 2015-10-23 – 2015-10-24 (×3): 10 mg via ORAL
  Filled 2015-10-22 (×3): qty 1

## 2015-10-22 MED ORDER — GELATIN ABSORBABLE MT POWD
OROMUCOSAL | Status: DC | PRN
  Administered 2015-10-22: 5 mL via TOPICAL

## 2015-10-22 MED ORDER — PROPOFOL 10 MG/ML IV BOLUS
INTRAVENOUS | Status: DC | PRN
  Administered 2015-10-22: 20 mg via INTRAVENOUS
  Administered 2015-10-22: 40 mg via INTRAVENOUS
  Administered 2015-10-22: 140 mg via INTRAVENOUS

## 2015-10-22 MED ORDER — LACTATED RINGERS IV SOLN
Freq: Once | INTRAVENOUS | Status: AC
  Administered 2015-10-22: 12:00:00 via INTRAVENOUS

## 2015-10-22 MED ORDER — VANCOMYCIN HCL 1000 MG IV SOLR
INTRAVENOUS | Status: AC
  Filled 2015-10-22: qty 1000

## 2015-10-22 MED ORDER — ASPIRIN EC 81 MG PO TBEC
81.0000 mg | DELAYED_RELEASE_TABLET | Freq: Every day | ORAL | Status: DC
  Administered 2015-10-23 – 2015-10-27 (×5): 81 mg via ORAL
  Filled 2015-10-22 (×5): qty 1

## 2015-10-22 MED ORDER — HYDROMORPHONE HCL 1 MG/ML IJ SOLN
INTRAMUSCULAR | Status: AC
  Filled 2015-10-22: qty 1

## 2015-10-22 MED ORDER — DORZOLAMIDE HCL-TIMOLOL MAL 2-0.5 % OP SOLN
1.0000 [drp] | Freq: Two times a day (BID) | OPHTHALMIC | Status: DC
  Administered 2015-10-22 – 2015-10-27 (×10): 1 [drp] via OPHTHALMIC
  Filled 2015-10-22: qty 10

## 2015-10-22 MED ORDER — PHENYLEPHRINE 40 MCG/ML (10ML) SYRINGE FOR IV PUSH (FOR BLOOD PRESSURE SUPPORT)
PREFILLED_SYRINGE | INTRAVENOUS | Status: AC
  Filled 2015-10-22: qty 10

## 2015-10-22 MED ORDER — ONDANSETRON HCL 4 MG/2ML IJ SOLN
INTRAMUSCULAR | Status: DC | PRN
Start: 2015-10-22 — End: 2015-10-22
  Administered 2015-10-22: 4 mg via INTRAVENOUS

## 2015-10-22 MED ORDER — OXYCODONE HCL 5 MG PO TABS
ORAL_TABLET | ORAL | Status: AC
  Filled 2015-10-22: qty 1

## 2015-10-22 MED ORDER — ARTIFICIAL TEARS OP OINT
TOPICAL_OINTMENT | OPHTHALMIC | Status: DC | PRN
  Administered 2015-10-22: 1 via OPHTHALMIC

## 2015-10-22 MED ORDER — ROCURONIUM BROMIDE 50 MG/5ML IV SOLN
INTRAVENOUS | Status: AC
  Filled 2015-10-22: qty 1

## 2015-10-22 MED ORDER — SODIUM CHLORIDE 0.9 % IR SOLN
Status: DC | PRN
  Administered 2015-10-22: 500 mL

## 2015-10-22 MED ORDER — CELECOXIB 200 MG PO CAPS
200.0000 mg | ORAL_CAPSULE | Freq: Two times a day (BID) | ORAL | Status: AC
  Administered 2015-10-22 – 2015-10-27 (×10): 200 mg via ORAL
  Filled 2015-10-22 (×10): qty 1

## 2015-10-22 MED ORDER — SUGAMMADEX SODIUM 500 MG/5ML IV SOLN
INTRAVENOUS | Status: DC | PRN
  Administered 2015-10-22: 240 mg via INTRAVENOUS

## 2015-10-22 MED ORDER — LACTATED RINGERS IV SOLN
INTRAVENOUS | Status: DC | PRN
  Administered 2015-10-22 (×2): via INTRAVENOUS

## 2015-10-22 MED ORDER — FLUOROMETHOLONE 0.1 % OP SUSP
1.0000 [drp] | Freq: Two times a day (BID) | OPHTHALMIC | Status: DC
  Administered 2015-10-22 – 2015-10-27 (×10): 1 [drp] via OPHTHALMIC
  Filled 2015-10-22: qty 5

## 2015-10-22 MED ORDER — VANCOMYCIN HCL 1000 MG IV SOLR
INTRAVENOUS | Status: DC | PRN
  Administered 2015-10-22: 1000 mg via TOPICAL

## 2015-10-22 MED ORDER — LABETALOL HCL 5 MG/ML IV SOLN
INTRAVENOUS | Status: AC
  Filled 2015-10-22: qty 4

## 2015-10-22 MED ORDER — OXYCODONE HCL 5 MG/5ML PO SOLN: 5.0000 mg | Freq: Once | ORAL | Status: AC | PRN

## 2015-10-22 MED ORDER — OXYCODONE-ACETAMINOPHEN 5-325 MG PO TABS
1.0000 | ORAL_TABLET | ORAL | Status: DC | PRN
  Administered 2015-10-23 – 2015-10-27 (×15): 2 via ORAL
  Filled 2015-10-22 (×16): qty 2

## 2015-10-22 MED ORDER — SUCCINYLCHOLINE CHLORIDE 200 MG/10ML IV SOSY
PREFILLED_SYRINGE | INTRAVENOUS | Status: AC
  Filled 2015-10-22: qty 10

## 2015-10-22 MED ORDER — ACETAMINOPHEN 650 MG RE SUPP: 650.0000 mg | RECTAL | Status: DC | PRN

## 2015-10-22 MED ORDER — DEXTROSE 5 % IV SOLN
10.0000 mg | INTRAVENOUS | Status: DC | PRN
  Administered 2015-10-22: 45 ug/min via INTRAVENOUS

## 2015-10-22 MED ORDER — SODIUM CHLORIDE 0.9% FLUSH: 3.0000 mL | INTRAVENOUS | Status: DC | PRN

## 2015-10-22 MED ORDER — ROCURONIUM BROMIDE 100 MG/10ML IV SOLN
INTRAVENOUS | Status: DC | PRN
Start: 2015-10-22 — End: 2015-10-22
  Administered 2015-10-22: 20 mg via INTRAVENOUS
  Administered 2015-10-22: 10 mg via INTRAVENOUS
  Administered 2015-10-22 (×2): 20 mg via INTRAVENOUS
  Administered 2015-10-22 (×2): 10 mg via INTRAVENOUS
  Administered 2015-10-22: 50 mg via INTRAVENOUS

## 2015-10-22 MED ORDER — INSULIN ASPART 100 UNIT/ML ~~LOC~~ SOLN
0.0000 [IU] | Freq: Three times a day (TID) | SUBCUTANEOUS | Status: DC
  Administered 2015-10-23: 15 [IU] via SUBCUTANEOUS
  Administered 2015-10-23: 8 [IU] via SUBCUTANEOUS
  Administered 2015-10-23: 11 [IU] via SUBCUTANEOUS
  Administered 2015-10-24 – 2015-10-25 (×5): 5 [IU] via SUBCUTANEOUS
  Administered 2015-10-25: 8 [IU] via SUBCUTANEOUS
  Administered 2015-10-26: 2 [IU] via SUBCUTANEOUS
  Administered 2015-10-26 (×2): 5 [IU] via SUBCUTANEOUS
  Administered 2015-10-27: 3 [IU] via SUBCUTANEOUS

## 2015-10-22 MED ORDER — FENTANYL CITRATE (PF) 250 MCG/5ML IJ SOLN
INTRAMUSCULAR | Status: AC
  Filled 2015-10-22: qty 5

## 2015-10-22 MED ORDER — ARTIFICIAL TEARS OP OINT
TOPICAL_OINTMENT | OPHTHALMIC | Status: AC
  Filled 2015-10-22: qty 3.5

## 2015-10-22 MED ORDER — BUPIVACAINE HCL (PF) 0.25 % IJ SOLN
INTRAMUSCULAR | Status: DC | PRN
  Administered 2015-10-22: 6 mL

## 2015-10-22 SURGICAL SUPPLY — 64 items
BAG DECANTER FOR FLEXI CONT (MISCELLANEOUS) ×2 IMPLANT
BENZOIN TINCTURE PRP APPL 2/3 (GAUZE/BANDAGES/DRESSINGS) ×2 IMPLANT
BIT DRILL VUEPOINT II (BIT) ×1 IMPLANT
BLADE CLIPPER SURG (BLADE) IMPLANT
BUR MATCHSTICK NEURO 3.0 LAGG (BURR) ×2 IMPLANT
CANISTER SUCT 3000ML PPV (MISCELLANEOUS) ×2 IMPLANT
DERMABOND ADVANCED (GAUZE/BANDAGES/DRESSINGS) ×1
DERMABOND ADVANCED .7 DNX12 (GAUZE/BANDAGES/DRESSINGS) ×1 IMPLANT
DRAPE C-ARM 42X72 X-RAY (DRAPES) ×4 IMPLANT
DRAPE LAPAROTOMY 100X72 PEDS (DRAPES) ×2 IMPLANT
DRAPE POUCH INSTRU U-SHP 10X18 (DRAPES) ×2 IMPLANT
DRILL BIT VUEPOINT II (BIT) ×1
DRSG OPSITE POSTOP 4X6 (GAUZE/BANDAGES/DRESSINGS) ×2 IMPLANT
DURAPREP 6ML APPLICATOR 50/CS (WOUND CARE) ×2 IMPLANT
ELECT BLADE 4.0 EZ CLEAN MEGAD (MISCELLANEOUS) ×2
ELECT REM PT RETURN 9FT ADLT (ELECTROSURGICAL) ×2
ELECTRODE BLDE 4.0 EZ CLN MEGD (MISCELLANEOUS) ×1 IMPLANT
ELECTRODE REM PT RTRN 9FT ADLT (ELECTROSURGICAL) ×1 IMPLANT
EVACUATOR 1/8 PVC DRAIN (DRAIN) ×2 IMPLANT
GAUZE SPONGE 4X4 12PLY STRL (GAUZE/BANDAGES/DRESSINGS) IMPLANT
GAUZE SPONGE 4X4 16PLY XRAY LF (GAUZE/BANDAGES/DRESSINGS) IMPLANT
GLOVE BIO SURGEON STRL SZ8 (GLOVE) ×2 IMPLANT
GLOVE BIOGEL PI IND STRL 6.5 (GLOVE) ×1 IMPLANT
GLOVE BIOGEL PI IND STRL 7.0 (GLOVE) ×2 IMPLANT
GLOVE BIOGEL PI INDICATOR 6.5 (GLOVE) ×1
GLOVE BIOGEL PI INDICATOR 7.0 (GLOVE) ×2
GLOVE EXAM NITRILE LRG STRL (GLOVE) IMPLANT
GLOVE EXAM NITRILE MD LF STRL (GLOVE) IMPLANT
GLOVE EXAM NITRILE XL STR (GLOVE) IMPLANT
GLOVE EXAM NITRILE XS STR PU (GLOVE) IMPLANT
GLOVE SURG SS PI 6.5 STRL IVOR (GLOVE) ×6 IMPLANT
GOWN STRL REUS W/ TWL LRG LVL3 (GOWN DISPOSABLE) ×1 IMPLANT
GOWN STRL REUS W/ TWL XL LVL3 (GOWN DISPOSABLE) ×2 IMPLANT
GOWN STRL REUS W/TWL 2XL LVL3 (GOWN DISPOSABLE) IMPLANT
GOWN STRL REUS W/TWL LRG LVL3 (GOWN DISPOSABLE) ×1
GOWN STRL REUS W/TWL XL LVL3 (GOWN DISPOSABLE) ×2
HEMOSTAT POWDER KIT SURGIFOAM (HEMOSTASIS) ×2 IMPLANT
KIT BASIN OR (CUSTOM PROCEDURE TRAY) ×2 IMPLANT
KIT ROOM TURNOVER OR (KITS) ×2 IMPLANT
MARKER SKIN DUAL TIP RULER LAB (MISCELLANEOUS) ×2 IMPLANT
MILL MEDIUM DISP (BLADE) ×2 IMPLANT
NEEDLE HYPO 18GX1.5 BLUNT FILL (NEEDLE) IMPLANT
NEEDLE HYPO 25X1 1.5 SAFETY (NEEDLE) ×2 IMPLANT
NEEDLE SPNL 20GX3.5 QUINCKE YW (NEEDLE) ×2 IMPLANT
NS IRRIG 1000ML POUR BTL (IV SOLUTION) ×2 IMPLANT
PACK LAMINECTOMY NEURO (CUSTOM PROCEDURE TRAY) ×2 IMPLANT
PIN MAYFIELD SKULL DISP (PIN) ×2 IMPLANT
ROD VUEPOINT 80MM (Rod) ×4 IMPLANT
SCREW MA MM 3.5X12 (Screw) ×6 IMPLANT
SCREW MA MM 3.5X14 (Screw) ×14 IMPLANT
SCREW SET THREADED (Screw) ×20 IMPLANT
SPONGE SURGIFOAM ABS GEL 100 (HEMOSTASIS) ×2 IMPLANT
STRIP CLOSURE SKIN 1/2X4 (GAUZE/BANDAGES/DRESSINGS) ×2 IMPLANT
SUT VIC AB 0 CT1 18XCR BRD8 (SUTURE) ×2 IMPLANT
SUT VIC AB 0 CT1 8-18 (SUTURE) ×2
SUT VIC AB 2-0 CP2 18 (SUTURE) ×2 IMPLANT
SUT VIC AB 3-0 SH 8-18 (SUTURE) ×4 IMPLANT
TOWEL OR 17X24 6PK STRL BLUE (TOWEL DISPOSABLE) ×2 IMPLANT
TOWEL OR 17X26 10 PK STRL BLUE (TOWEL DISPOSABLE) ×2 IMPLANT
TRAP SPECIMEN MUCOUS 40CC (MISCELLANEOUS) ×2 IMPLANT
TRAY FOLEY W/METER SILVER 14FR (SET/KITS/TRAYS/PACK) IMPLANT
TRAY FOLEY W/METER SILVER 16FR (SET/KITS/TRAYS/PACK) IMPLANT
UNDERPAD 30X30 INCONTINENT (UNDERPADS AND DIAPERS) IMPLANT
WATER STERILE IRR 1000ML POUR (IV SOLUTION) ×2 IMPLANT

## 2015-10-22 NOTE — Op Note (Signed)
10/22/2015  5:04 PM  PATIENT:  [name]  55 y.o. female  PRE-OPERATIVE DIAGNOSIS:  Cervical spinal stenosis with cervical spondylitic myelopathy C3-4 C4-5 and C6-7  POST-OPERATIVE DIAGNOSIS:  Same  PROCEDURE:  1. Decompressive cervical laminectomy C3-4 C4-5 C5-6 and C6-7 with removal of synovial cyst C3-4 on the right C4-5 on the left and C6-7 on the right, 2. Posterior cervical fusion C3-C7 utilizing locally harvested morselized autologous bone graft, 3. Segmental fixation C3-C7 utilizing Nuvasive lateral mass screws  SURGEON:  Sherley Bounds, MD  ASSISTANTS: Dr. Arnoldo Morale  ANESTHESIA:   General  EBL: 50 ml  Total I/O In: 1200 [I.V.:1200] Out: 67 [Blood:50]  BLOOD ADMINISTERED:none  DRAINS: Medium Hemovac   SPECIMEN:  No Specimen  INDICATION FOR PROCEDURE: This patient presented with neck pain and numbness and tingling in her hands. She had mild myelopathy on exam. MRI showed severe spinal stenosis C3-4 and C4-5 and C6-7 with posterior element disease with posterior compression of the cord with signal change in the cord at C3-4. I recommended decompressive laminectomy followed by instrumented fusion Patient understood the risks, benefits, and alternatives and potential outcomes and wished to proceed.  PROCEDURE DETAILS: The patient was brought to the operating room. Generalized endotracheal anesthesia was induced. The patient was affixed a 3 point [place] headrest and rolled into the prone position on chest rolls. All pressure points were padded. The posterior cervical region was cleaned and prepped with DuraPrep and then draped in the usual sterile fashion. 7 cc of local anesthesia was injected and a dorsal midline incision made in the posterior cervical region and carried down to the cervical fascia. The fascia was opened and the paraspinous musculature was taken down to expose C3-C7 bilaterally. Intraoperative fluoroscopy confirmed my level and then the dissection was carried  out over the lateral facets. I localized the midpoint of each lateral mass and marked a region 1 mm medial to the midpoint of the lateral mass, and then drilled in an upward and outward direction into the safe zone of each lateral mass. I drilled to a depth of 14 mm and then checked my drill hole with a ball probe. I then placed a 14 mm lateral mass screws into the safe zone of each lateral mass until they were 2 fingers tight. I then gently decompressed the central canal with the 1 and 2 mm Kerrison punch from C3 to the top of C7. Medial facetectomies were performed, and foraminotomies were performed at C3-C7. She had significant compression of the thecal sac by what appeared to be synovial cysts or facet arthropathy at C6-7 on the right and C4-5 on the left and C3-4 on the right. Considerable care was taken to be very gentle with the removal. However there was no skin adhesion to the dura. Once the decompression was complete the dura was full and capacious and I could see the spinal cord pulsatile through the dura. I then decorticated the lateral masses and the facet joints and packed them with local autograft and morcellized allograft to perform arthrodesis from C3-C7 bilaterally. I then placed rods into the multiaxial screw heads of the screws and locked these into position with the locking caps and anti-torque device. I then checked the final construct with AP/Lat fluoroscopy. I irrigated with saline solution containing bacitracin. I placed a medium Hemovac drain through separate stab incision, and lined the dura with Gelfoam. After hemostasis was achieved I closed the muscle and the fascia with 0 Vicryl, subcutaneous tissue with 2-0 Vicryl,  and the subcuticular tissue with 3-0 Vicryl. The skin was closed with benzoin and Steri-Strips. A sterile dressing was applied, the patient was turned to the supine position and taken out of the headrest, awakened from general anesthesia and transferred to the recovery room  in stable condition. At the end of the procedure all sponge, needle and instrument counts were correct.   PLAN OF CARE: Admit to inpatient   PATIENT DISPOSITION:  PACU - hemodynamically stable.   Delay start of Pharmacological VTE agent (>24hrs) due to surgical blood loss or risk of bleeding:  yes

## 2015-10-22 NOTE — Anesthesia Preprocedure Evaluation (Addendum)
Anesthesia Evaluation  Patient identified by MRN, date of birth, ID band Patient awake    Reviewed: Allergy & Precautions, NPO status , Patient's Chart, lab work & pertinent test results  History of Anesthesia Complications Negative for: history of anesthetic complications  Airway Mallampati: II  TM Distance: >3 FB Neck ROM: Limited    Dental  (+) Poor Dentition, Loose, Dental Advisory Given,    Pulmonary pneumonia, former smoker,    Pulmonary exam normal        Cardiovascular hypertension, + CAD and +CHF  Normal cardiovascular exam     Neuro/Psych  Headaches, Bilateral UE numbness, pain R>L. Also pain in neck and shoulders  Neuromuscular disease    GI/Hepatic GERD  Medicated and Controlled,  Endo/Other  diabetes, Type 2, Insulin DependentMorbid obesity  Renal/GU      Musculoskeletal  (+) Arthritis ,   Abdominal Normal abdominal exam  (+)   Peds  Hematology   Anesthesia Other Findings   Reproductive/Obstetrics                           Anesthesia Physical Anesthesia Plan  ASA: III  Anesthesia Plan: General   Post-op Pain Management:    Induction: Intravenous  Airway Management Planned: Oral ETT  Additional Equipment:   Intra-op Plan:   Post-operative Plan: Extubation in OR  Informed Consent: I have reviewed the patients History and Physical, chart, labs and discussed the procedure including the risks, benefits and alternatives for the proposed anesthesia with the patient or authorized representative who has indicated his/her understanding and acceptance.   Dental advisory given  Plan Discussed with: Anesthesiologist and CRNA  Anesthesia Plan Comments:         Anesthesia Quick Evaluation

## 2015-10-22 NOTE — H&P (Signed)
Subjective:   Patient is a 55 y.o. female admitted for cervical spondylitic myelopathy. The patient first presented to me with complaints of neck pain, arm pain, shooting pains in the arm(s) and numbness of the arm(s). Onset of symptoms was several months ago. The pain is described as aching and occurs all day. The pain is rated severe, and is located  across the neck and radiates to the arms. The symptoms have been progressive. Symptoms are exacerbated by extending head backwards, and are relieved by none.  Previous work up includes MRI of cervical spine, results: spinal stenosis.  Past Medical History  Diagnosis Date  . Hypertension   . Hyperlipidemia   . GERD (gastroesophageal reflux disease)   . Adhesive capsulitis of shoulder     bilateral, Steroid injection Dr. Truman Hayward 1/12 bilaterally  . Peripheral neuropathy ([place])   . Adhesive capsulitis of right shoulder     Steroid injection Dr. Truman Hayward 1/12  . Adhesive capsulitis of left shoulder     Steroid injection Dr. Truman Hayward 1/12  . Obesity   . Diabetic retinopathy   . Glaucoma   . Diabetes mellitus     Type II, insulin dependent  . CAD (coronary artery disease)     nonobstructive. Last cardiac cath (2008) showing left circumflex with mid 50% stenosis and distal luminal irregularities. Also with RCA with mid to distal 30-40% stenosis. // Previously evaluated by [facility], never followed up outpatient.  . CHF (congestive heart failure) ([place])   . CAP (community acquired pneumonia) 06/15/2012    05/2012 CXR: Mild opacification of the posterior lung base on the lateral film  as cannot exclude infection/atelectasis.      Past Surgical History  Procedure Laterality Date  . Cataract extraction    . Glaucoma surgery    . Cardiac catheterization    . Tubal ligation    . Left heart catheterization with coronary angiogram N/A 01/24/2012    Procedure: LEFT HEART CATHETERIZATION WITH CORONARY ANGIOGRAM;  Surgeon: Sherren Mocha, MD;  Location: [facility]  CATH LAB;  Service: Cardiovascular;  Laterality: N/A;    No Known Allergies  Social History  Substance Use Topics  . Smoking status: Former Smoker -- 0.30 packs/day for 7 years    Types: Cigarettes    Quit date: 06/27/2006  . Smokeless tobacco: Never Used  . Alcohol Use: No    Family History  Problem Relation Age of Onset  . Hypertension Sister   . Hypertension Sister   . Hypertension Sister   . Heart attack Neg Hx   . Stroke Neg Hx    Prior to Admission medications   Medication Sig Start Date End Date Taking? Authorizing Provider  amitriptyline (ELAVIL) 25 MG tablet TAKE 1 TABLET BY MOUTH AT BEDTIME 07/03/15  Yes Collier Salina, MD  aspirin EC 81 MG EC tablet Take 1 tablet (81 mg total) by mouth daily. 05/01/15  Yes Erma Heritage, PA  carvedilol (COREG) 6.25 MG tablet Take 1 tablet (6.25 mg total) by mouth 2 (two) times daily. 12/04/14  Yes Luan Moore, MD  cyclobenzaprine (FLEXERIL) 10 MG tablet Take 1 tablet (10 mg total) by mouth 2 (two) times daily as needed for muscle spasms. 09/09/15  Yes Tasrif Ahmed, MD  diclofenac sodium (VOLTAREN) 1 % GEL Apply 2 g topically 4 (four) times daily. Patient taking differently: Apply 2 g topically 4 (four) times daily as needed.  09/09/15  Yes Tasrif Ahmed, MD  dorzolamide-timolol (COSOPT) 22.3-6.8 MG/ML ophthalmic solution Place 1 drop  into both eyes 2 (two) times daily.    Yes Historical Provider, MD  ferrous sulfate 325 (65 FE) MG tablet Take 1 tablet (325 mg total) by mouth daily with breakfast. 06/15/12  Yes Dorian Heckle, MD  fluorometholone (FML) 0.1 % ophthalmic suspension Place 1 drop into both eyes 2 (two) times daily.  05/27/15  Yes Historical Provider, MD  furosemide (LASIX) 40 MG tablet Take 0.5 tablets (20 mg total) by mouth daily. 09/09/15  Yes Tasrif Ahmed, MD  gabapentin (NEURONTIN) 300 MG capsule TAKE 1 CAPSULE THREE TIMES DAILY 09/21/15  Yes Collier Salina, MD  glucose blood (ONETOUCH VERIO) test strip Check blood sugar  as instructed up to 3 times a day 02/26/14  Yes Ejiroghene E Emokpae, MD  insulin aspart (NOVOLOG FLEXPEN) 100 UNIT/ML FlexPen Inject 10 Units into the skin 3 (three) times daily with meals. 08/07/15  Yes Lucious Groves, DO  Insulin Glargine (LANTUS SOLOSTAR) 100 UNIT/ML Solostar Pen Inject 44 Units into the skin at bedtime. 01/30/15  Yes Collier Salina, MD  Insulin Pen Needle (B-D UF III MINI PEN NEEDLES) 31G X 5 MM MISC Use to inject insulin 4-5 times daily   Yes Madilyn Fireman, MD  latanoprost (XALATAN) 0.005 % ophthalmic solution Place 1 drop into both eyes at bedtime.  05/12/15  Yes Historical Provider, MD  lisinopril (PRINIVIL,ZESTRIL) 20 MG tablet Take 0.5 tablets (10 mg total) by mouth daily. 09/09/15  Yes Tasrif Ahmed, MD  metFORMIN (GLUCOPHAGE) 1000 MG tablet Take 1 tablet (1,000 mg total) by mouth 2 (two) times daily with a meal. 10/07/15  Yes Collier Salina, MD  ondansetron (ZOFRAN) 4 MG tablet Take 1 tablet (4 mg total) by mouth every 8 (eight) hours as needed for nausea or vomiting. 03/31/15  Yes Corky Sox, MD  [facility] DELICA LANCETS FINE MISC Check blood sugar as instructed up to 3 times a day 02/26/14  Yes Ejiroghene E Emokpae, MD  pantoprazole (PROTONIX) 40 MG tablet Take 1 tablet (40 mg total) by mouth daily. 02/19/15  Yes Tasrif Ahmed, MD  pravastatin (PRAVACHOL) 20 MG tablet Take 1 tablet (20 mg total) by mouth daily. 12/04/14  Yes Luan Moore, MD  triamcinolone cream (KENALOG) 0.1 % Apply 1 application topically 2 (two) times daily. Patient taking differently: Apply 1 application topically 2 (two) times daily as needed (itching).  02/19/15  Yes Tasrif Ahmed, MD  HYDROcodone-acetaminophen (NORCO/VICODIN) 5-325 MG tablet Take 1 tablet by mouth every 6 (six) hours as needed for moderate pain (pain). 10/14/15   Collier Salina, MD     Review of Systems  Positive ROS: neg  All other systems have been reviewed and were otherwise negative with the exception of those mentioned in  the HPI and as above.  Objective: Vital signs in last 24 hours:    General Appearance: Alert, cooperative, no distress, appears stated age Head: Normocephalic, without obvious abnormality, atraumatic Eyes: PERRL, conjunctiva/corneas clear, EOM's intact      Neck: Supple, symmetrical, trachea midline, Back: Symmetric, no curvature, ROM normal, no CVA tenderness Lungs:  respirations unlabored Heart: Regular rate and rhythm Abdomen: Soft, non-tender Extremities: Extremities normal, atraumatic, no cyanosis or edema Pulses: 2+ and symmetric all extremities Skin: Skin color, texture, turgor normal, no rashes or lesions  NEUROLOGIC:  Mental status: Alert and oriented x4, no aphasia, good attention span, fund of knowledge and memory  Motor Exam - grossly normal Sensory Exam - grossly normal Reflexes: Hyperreflexic with Hoffman sign Coordination - grossly normal  Gait - grossly normal Balance - grossly normal Cranial Nerves: I: smell Not tested  II: visual acuity  OS: nl    OD: nl  II: visual fields Full to confrontation  II: pupils Equal, round, reactive to light  III,VII: ptosis None  III,IV,VI: extraocular muscles  Full ROM  V: mastication Normal  V: facial light touch sensation  Normal  V,VII: corneal reflex  Present  VII: facial muscle function - upper  Normal  VII: facial muscle function - lower Normal  VIII: hearing Not tested  IX: soft palate elevation  Normal  IX,X: gag reflex Present  XI: trapezius strength  5/5  XI: sternocleidomastoid strength 5/5  XI: neck flexion strength  5/5  XII: tongue strength  Normal    Data Review Lab Results  Component Value Date   WBC 10.4 10/15/2015   HGB 10.9* 10/15/2015   HCT 35.0* 10/15/2015   MCV 88.2 10/15/2015   PLT 269 10/15/2015   Lab Results  Component Value Date   NA 138 10/15/2015   K 4.3 10/15/2015   CL 105 10/15/2015   CO2 20* 10/15/2015   BUN 18 10/15/2015   CREATININE 0.97 10/15/2015   GLUCOSE 229*  10/15/2015   Lab Results  Component Value Date   INR 0.99 10/15/2015    Assessment:   Cervical neck pain with herniated nucleus pulposus/ spondylosis/ stenosis at C3-4 C4-5 and C6-7. Patient has failed conservative therapy. Planned surgery : Cervical laminectomy C3-C7 followed by posterior cervical fusion with instrumentation  Plan:   I explained the condition and procedure to the patient and answered any questions.  Patient wishes to proceed with procedure as planned. Understands risks/ benefits/ and expected or typical outcomes.  [name] S 10/22/2015 10:55 AM

## 2015-10-22 NOTE — Transfer of Care (Signed)
Immediate Anesthesia Transfer of Care Note  Patient: Marcene Brawn  Procedure(s) Performed: Procedure(s) with comments: Cervical three-Cervical Seven Posterior cervical fusion with lateral mass fixation,  Laminectomy Cervical Three-Cervical Seven (N/A) - Posterior  Patient Location: PACU  Anesthesia Type:General  Level of Consciousness: awake, alert  and patient cooperative  Airway & Oxygen Therapy: Patient Spontanous Breathing and Patient connected to nasal cannula oxygen  Post-op Assessment: Report given to RN, Post -op Vital signs reviewed and stable and Patient moving all extremities  Post vital signs: Reviewed and stable  Last Vitals:  Filed Vitals:   10/22/15 1106  BP: 133/75  Pulse: 102  Temp: 36.9 C  Resp: 20    Last Pain:  Filed Vitals:   10/22/15 1715  PainSc: 9       Patients Stated Pain Goal: 5 ([phone] [phone])  Complications: No apparent anesthesia complications

## 2015-10-22 NOTE — Anesthesia Procedure Notes (Signed)
Procedure Name: Intubation Date/Time: 10/22/2015 2:02 PM Performed by: Trixie Deis A Pre-anesthesia Checklist: Patient identified, Timeout performed, Emergency Drugs available, Suction available and Patient being monitored Patient Re-evaluated:Patient Re-evaluated prior to inductionOxygen Delivery Method: Circle system utilized Preoxygenation: Pre-oxygenation with 100% oxygen Intubation Type: IV induction Ventilation: Mask ventilation without difficulty and Oral airway inserted - appropriate to patient size Laryngoscope Size: Glidescope and 4 Grade View: Grade I Tube type: Oral Tube size: 7.0 mm Number of attempts: 1 Airway Equipment and Method: Rigid stylet and Video-laryngoscopy Placement Confirmation: ETT inserted through vocal cords under direct vision,  breath sounds checked- equal and bilateral and positive ETCO2 Secured at: 22 cm Tube secured with: Tape Dental Injury: Teeth and Oropharynx as per pre-operative assessment

## 2015-10-23 ENCOUNTER — Encounter (HOSPITAL_COMMUNITY): Payer: Self-pay | Admitting: Neurological Surgery

## 2015-10-23 LAB — GLUCOSE, CAPILLARY
GLUCOSE-CAPILLARY: 337 mg/dL — AB (ref 65–99)
GLUCOSE-CAPILLARY: 369 mg/dL — AB (ref 65–99)
Glucose-Capillary: 259 mg/dL — ABNORMAL HIGH (ref 65–99)
Glucose-Capillary: 229 mg/dL — ABNORMAL HIGH (ref 65–99)

## 2015-10-23 MED ORDER — INSULIN GLARGINE 100 UNIT/ML ~~LOC~~ SOLN
44.0000 [IU] | Freq: Every day | SUBCUTANEOUS | Status: DC
  Administered 2015-10-23 – 2015-10-26 (×4): 44 [IU] via SUBCUTANEOUS
  Filled 2015-10-23 (×5): qty 0.44

## 2015-10-23 MED FILL — Heparin Sodium (Porcine) Inj 1000 Unit/ML: INTRAMUSCULAR | Qty: 30 | Status: AC

## 2015-10-23 MED FILL — Sodium Chloride IV Soln 0.9%: INTRAVENOUS | Qty: 1000 | Status: AC

## 2015-10-23 NOTE — Care Management Note (Signed)
Case Management Note  Patient Details  Name: [name] MRN: [id number] Date of Birth: 1960/12/27  Subjective/Objective:                    Action/Plan: Patient was admitted for a cervical laminectomy and fusion with synovial cyst removal. Lives at home with children. Will follow for discharge needs pending PT/OT evals and physician orders.  Expected Discharge Date:                  Expected Discharge Plan:     In-House Referral:     Discharge planning Services     Post Acute Care Choice:    Choice offered to:     DME Arranged:    DME Agency:     HH Arranged:    HH Agency:     Status of Service:  In process, will continue to follow  Medicare Important Message Given:    Date Medicare IM Given:    Medicare IM give by:    Date Additional Medicare IM Given:    Additional Medicare Important Message give by:     If discussed at [place] of Stay Meetings, dates discussed:    Additional CommentsRolm Baptise, RN 10/23/2015, 9:04 AM [phone]

## 2015-10-23 NOTE — Progress Notes (Addendum)
Inpatient Diabetes Program Recommendations  AACE/ADA: New Consensus Statement on Inpatient Glycemic Control (2015)  Target Ranges:  Prepandial:   less than 140 mg/dL      Peak postprandial:   less than 180 mg/dL (1-2 hours)      Critically ill patients:  140 - 180 mg/dL   Review of Glycemic Control  Diabetes history: Type 2 Outpatient Diabetes medications: Lantus 44 units - novolog 10 units meal coverage tidwc, and metformin 1000 mg bid. Current orders for Inpatient glycemic control: Moderate correction tidwc and HS correction scale.  Inpatient Diabetes Program Recommendations:    Patient takes lantus 44 units at home in addition to meal coverage of 10 units tidwc. Glucose is high all day today. Please add lantus here of 20 units daily (less than 1/2 home dose. HgbA1C is high at 9.7%  Will talk with patient to assess potential needs. (Glucose was well controlled on admission prior to surgery).  Ad-Spoke with patient regarding her glucose control at home. She stated that her blood sugars are higher now than they usually are at home).  She states that she is followed by Internal Medicine here at [facility]. She is aware of her elevated HgbA1C, but states she does not have a problem at home. Asked her if she had seen the dietician in Internal Medicine; she stated it had been a long time. Noted patient also on [name] registry. Lantus has been ordered as requested at full home dose of 44 units at HS.   Thank you Rosita Kea, RN, MSN, CDE  Diabetes Inpatient Program Office: [phone] Pager: [phone] 8:00 am to 5:00 pm

## 2015-10-23 NOTE — Progress Notes (Signed)
OT Cancellation Note  Patient Details Name: [name] MRN: [id number] DOB: 1961-06-15   Cancelled Treatment:    Reason Eval/Treat Not Completed: Pain limiting ability to participate;Other (comment) ("My daughter is bringing me clothes"). Pt adamantly refusing to work with OT at this time. States she will not get up until she has had pain medication and her daughter brings her clothes to put on. RN notified pt requesting pain medication. Encouraged OOB activities and participation in therapy but pt continued to decline. Will follow up for OT eval as time allows.  Binnie Kand M.S., OTR/L Pager: [phone]  10/23/2015, 8:56 AM

## 2015-10-23 NOTE — Evaluation (Signed)
Occupational Therapy Evaluation Patient Details Name: [name] MRN: [id number] DOB: 10/12/1960 Today's Date: 10/23/2015    History of Present Illness Patient is a 55 y/o female admitted with cervical spondolytic myelopathy now s/p decompressive laminectomy C3-4, C4-5, C5-6 and C6-7 as well as removal of synovial cyst and posterior cervical fusion C3-7.  PMH positive for HTN, HLD, DM w/ retinopathy and neuropathy, GERD, nonobstructive CAD and CHF.   Clinical Impression   Pt reports she was receiving assist from a home health aide for bathing and dressing PTA. Currently pt is overall min assist for functional mobility and ADLs with the exception of max assist for LB ADLs. Began safety, ADL, and cervical education with pt. Pt planning to d/c home with 24/7 supervision from family. Pt would benefit from continued skilled OT to address established goals.    Follow Up Recommendations  No OT follow up;Supervision/Assistance - 24 hour    Equipment Recommendations  None recommended by OT    Recommendations for Other Services       Precautions / Restrictions Precautions Precautions: Fall;Cervical Precaution Comments: Reviewed precautions with pt and provided handout. Other Brace/Splint: No brace per chart Restrictions Weight Bearing Restrictions: No      Mobility Bed Mobility Overal bed mobility: Needs Assistance Bed Mobility: Rolling;Sidelying to Sit;Sit to Sidelying Rolling: Min guard Sidelying to sit: Min assist;HOB elevated     Sit to sidelying: Min assist General bed mobility comments: Min assist to rise trunk to seated position. Heavy use of bed rails and VCs for technique.  Transfers Overall transfer level: Needs assistance Equipment used: Rolling walker (2 wheeled) Transfers: Sit to/from Stand Sit to Stand: Min assist         General transfer comment: Min assist to boost up from EOB x 2. VCs for hand placement with RW.    Balance Overall balance assessment:  Needs assistance Sitting-balance support: Feet supported;No upper extremity supported Sitting balance-Leahy Scale: Fair     Standing balance support: Bilateral upper extremity supported Standing balance-Leahy Scale: Poor Standing balance comment: RW for support with dynamic standing                            ADL Overall ADL's : Needs assistance/impaired Eating/Feeding: Set up;Sitting   Grooming: Minimal assistance;Standing   Upper Body Bathing: Minimal assitance;Sitting   Lower Body Bathing: Maximal assistance;Sit to/from stand   Upper Body Dressing : Minimal assistance;Sitting   Lower Body Dressing: Maximal assistance;Sit to/from stand   Toilet Transfer: Minimal assistance;Ambulation;BSC;RW (BSC over toilet)   Toileting- Clothing Manipulation and Hygiene: Maximal assistance;Sit to/from stand       Functional mobility during ADLs: Minimal assistance;Rolling walker General ADL Comments: Educated pt on maintaining cervical precautions during functional activities. Pt not tolerating much OOB activity secondary to pain in neck and head feeling "heavy" like she cant hold it up.      Vision Vision Assessment?: No apparent visual deficits   Perception     Praxis      Pertinent Vitals/Pain Pain Assessment: Faces Faces Pain Scale: Hurts even more Pain Location: neck with mobility Pain Descriptors / Indicators: Grimacing;Guarding;Operative site guarding;Sore Pain Intervention(s): Limited activity within patient's tolerance;Monitored during session     Hand Dominance Left   Extremity/Trunk Assessment Upper Extremity Assessment Upper Extremity Assessment: RUE deficits/detail;LUE deficits/detail RUE Deficits / Details: able to lift to shoulder level painful in shoulder, strength NT at shoulder; elbow flex and ext 4-/5 grip WFL LUE Deficits /  Details: able to lift to shoulder level, strength NT at shoulder; elbow flex and ext 4-/5 grip [name]   Lower Extremity  Assessment Lower Extremity Assessment: Defer to PT evaluation   Cervical / Trunk Assessment Cervical / Trunk Assessment: Kyphotic;Other exceptions Cervical / Trunk Exceptions: unable to hold her head upright, holding hand to chin to keep head up   Communication Communication Communication: No difficulties   Cognition Arousal/Alertness: Awake/alert Behavior During Therapy: WFL for tasks assessed/performed Overall Cognitive Status: Within Functional Limits for tasks assessed       Memory: Decreased recall of precautions             General Comments       Exercises       Shoulder Instructions      Home Living Family/patient expects to be discharged to:: Private residence Living Arrangements: Children Available Help at Discharge: Family;Available 24 hours/day;Personal care attendant Type of Home: House Home Access: Stairs to enter [employer] of Steps: 6 Entrance Stairs-Rails: Right;Left;Can reach both Home Layout: Two level;Laundry or work area in Building surveyor of Steps: 10 Alternate Level Stairs-Rails: Right Bathroom Shower/Tub: Tub/shower unit Shower/tub characteristics: Architectural technologist: Handicapped height Bathroom Accessibility: Yes How Accessible: Accessible via walker Home Equipment: [place] - 2 wheels;Bedside commode;Shower seat          Prior Functioning/Environment Level of Independence: Needs assistance    ADL's / Homemaking Assistance Needed: [place] aide assists with bathing and dressing. 7 days per week x2 hours per day   Comments: reports not using walker PTA    OT Diagnosis: Generalized weakness;Acute pain   OT Problem List: Decreased strength;Decreased range of motion;Decreased activity tolerance;Impaired balance (sitting and/or standing);Decreased safety awareness;Decreased knowledge of use of DME or AE;Decreased knowledge of precautions;Obesity;Pain   OT Treatment/Interventions: Self-care/ADL  training;Energy conservation;DME and/or AE instruction;Therapeutic activities;Patient/family education;Balance training    OT Goals(Current goals can be found in the care plan section) Acute Rehab OT Goals Patient Stated Goal: To go home OT Goal Formulation: With patient Time For Goal Achievement: 11/06/15 Potential to Achieve Goals: Good ADL Goals Pt Will Perform Grooming: with supervision;standing Pt Will Transfer to Toilet: with supervision;ambulating;bedside commode (BSC over toilet) Pt Will Perform Toileting - Clothing Manipulation and hygiene: with supervision;sit to/from stand Pt Will Perform Tub/Shower Transfer: Tub transfer;with min guard assist;ambulating;shower seat;rolling walker  OT Frequency: Min 2X/week   Barriers to D/C:            Co-evaluation              End of Session Equipment Utilized During Treatment: Gait belt;Rolling walker Nurse Communication: Mobility status  Activity Tolerance: Patient limited by pain Patient left: in bed;with call bell/phone within reach;with bed alarm set;with family/visitor present   Time: [id number] OT Time Calculation (min): 17 min Charges:  OT General Charges $OT Visit: 1 Procedure OT Evaluation $OT Eval Moderate Complexity: 1 Procedure G-Codes:     Binnie Kand M.S., OTR/L Pager: [phone]  10/23/2015, 2:31 PM

## 2015-10-23 NOTE — Evaluation (Signed)
Physical Therapy Evaluation Patient Details Name: [name] MRN: [id number] DOB: [birth year] Today's Date: 10/23/2015   History of Present Illness  Patient is a 55 y/o female admitted with cervical spondolytic myelopathy now s/p decompressive laminectomy C3-4, C4-5, C5-6 and C6-7 as well as removal of synovial cyst and posterior cervical fusion C3-7.  PMH positive for HTN, HLD, DM w/ retinopathy and neuropathy, GERD, nonobstructive CAD and CHF.  Clinical Impression  Patient presents with decreased independence with mobility due to pain, weakness in neck muscles and poor overall activity tolerance as well as issues noted in PT problem list below.  Feel she will benefit from skilled PT in the acute setting to allow return home with family assist.  Likely not to need PT follow up.  May benefit from soft cervical collar for support as pt having hard time holding up her head.     Follow Up Recommendations No PT follow up    Equipment Recommendations  None recommended by PT    Recommendations for Other Services       Precautions / Restrictions Precautions Precautions: Fall;Cervical Other Brace/Splint: No brace per chart      Mobility  Bed Mobility Overal bed mobility: Needs Assistance Bed Mobility: Rolling;Sidelying to Sit;Sit to Sidelying Rolling: Min assist Sidelying to sit: Min assist;HOB elevated     Sit to sidelying: Mod assist General bed mobility comments: cues and assist, increased time for task completion; assist for technique/hand placement and lifting trunk upright and for legs onto bed to sidelying  Transfers Overall transfer level: Needs assistance Equipment used: Rolling walker (2 wheeled) Transfers: Sit to/from Stand Sit to Stand: Min assist         General transfer comment: lifting assist (daughter also helping at pt request) and pt pulling up on walker  Ambulation/Gait Ambulation/Gait assistance: Min assist Ambulation Distance (Feet): 50  Feet Assistive device: Rolling walker (2 wheeled) Gait Pattern/deviations: Step-through pattern;Decreased stride length     General Gait Details: difficulty keeping her head up and c/o pain so limited gait distance  Stairs            Wheelchair Mobility    Modified Rankin (Stroke Patients Only)       Balance Overall balance assessment: Needs assistance Sitting-balance support: Feet unsupported;Single extremity supported Sitting balance-Leahy Scale: Fair     Standing balance support: Bilateral upper extremity supported Standing balance-Leahy Scale: Poor                               Pertinent Vitals/Pain Pain Assessment: Faces Faces Pain Scale: Hurts even more Pain Location: neck with mobility Pain Descriptors / Indicators: Discomfort;Grimacing;Operative site guarding Pain Intervention(s): Repositioned;Monitored during session;Limited activity within patient's tolerance    Home Living Family/patient expects to be discharged to:: Private residence Living Arrangements: Children Available Help at Discharge: Family;Available 24 hours/day Type of Home: House Home Access: Stairs to enter Entrance Stairs-Rails: Right;Left;Can reach both Entrance Stairs-Number of Steps: 6 Home Layout: Two level;Laundry or work area in [place]: Environmental consultant - 2 wheels      Prior Function Level of Independence: Independent         Comments: reports not using walker PTA     Hand Dominance   Dominant Hand: Left    Extremity/Trunk Assessment   Upper Extremity Assessment: RUE deficits/detail;LUE deficits/detail RUE Deficits / Details: able to lift to shoulder level painful in shoulder, strength NT at shoulder; elbow flex and ext  4-/5 grip WFL     LUE Deficits / Details: able to lift to shoulder level, strength NT at shoulder; elbow flex and ext 4-/5 grip WFL   Lower Extremity Assessment: Generalized weakness      Cervical / Trunk Assessment:  Kyphotic;Other exceptions  Communication   Communication: No difficulties  Cognition Arousal/Alertness: Awake/alert Behavior During Therapy: Agitated Overall Cognitive Status: Within Functional Limits for tasks assessed                      General Comments General comments (skin integrity, edema, etc.): began education on precautions; discussed wtih nursing possible need for soft collar    Exercises        Assessment/Plan    PT Assessment Patient needs continued PT services  PT Diagnosis Abnormality of gait;Acute pain   PT Problem List Decreased knowledge of use of DME;Decreased activity tolerance;Decreased balance;Decreased mobility;Decreased knowledge of precautions;Pain  PT Treatment Interventions DME instruction;Balance training;Gait training;Stair training;Patient/family education;Functional mobility training;Therapeutic activities;Therapeutic exercise   PT Goals (Current goals can be found in the Care Plan section) Acute Rehab PT Goals Patient Stated Goal: To go home PT Goal Formulation: With patient Time For Goal Achievement: 10/28/15 Potential to Achieve Goals: Good    Frequency Min 5X/week   Barriers to discharge        Co-evaluation               End of Session Equipment Utilized During Treatment: Gait belt Activity Tolerance: Patient limited by pain Patient left: in bed;with call bell/phone within reach;with family/visitor present;with nursing/sitter in room           Time: 1125-1145 PT Time Calculation (min) (ACUTE ONLY): 20 min   Charges:   PT Evaluation $PT Eval Moderate Complexity: 1 Procedure     PT G CodesReginia Naas Oct 26, 2015, 1:37 PM  Magda Kiel, [place] 26-Oct-2015

## 2015-10-23 NOTE — Progress Notes (Signed)
Patient ID: [name], female   DOB: [birth year], 55 y.o.   MRN: [id number] Subjective:  The patient is alert and pleasant. She complains of neck pain. She is not ready to go home yet.  Objective: Vital signs in last 24 hours: Temp:  [97.4 F (36.3 C)-98.9 F (37.2 C)] 97.4 F (36.3 C) (04/28 0508) Pulse Rate:  [84-116] 84 (04/28 0508) Resp:  [16-20] 20 (04/28 0508) BP: (110-180)/(49-118) 131/64 mmHg (04/28 0508) SpO2:  [92 %-100 %] 100 % (04/28 0508) Weight:  [118.389 kg (261 lb)] 118.389 kg (261 lb) (04/27 1112)  Intake/Output from previous day: 04/27 0701 - 04/28 0700 In: 1200 [I.V.:1200] Out: 380 [Urine:300; Drains:30; Blood:50] Intake/Output this shift:    Physical exam patient is alert and pleasant. She is moving all 4 extremities well.  Lab Results: No results for input(s): WBC, HGB, HCT, PLT in the last 72 hours. BMET No results for input(s): NA, K, CL, CO2, GLUCOSE, BUN, CREATININE, CALCIUM in the last 72 hours.  Studies/Results: Dg Cervical Spine 2-3 Views  10/22/2015  CLINICAL DATA:  C3 through C7 cervical fusion. EXAM: DG C-ARM 61-120 MIN; CERVICAL SPINE - 2-3 VIEW COMPARISON:  09/28/2015 FINDINGS: A series of 3 intraoperative images of the cervical spine are obtained. The first at 4:18 p.m. demonstrates tissue spreaders in place posterior to C3-4, with facet screw and posterolateral rod hardware involving C3, C4, and extending caudad. The next image obtained at 4:19 is a lateral projection with obscured vertebral margins due to the presence of the shoulders. The tissue spreaders are observed as a landmark, and there are screw attachment terminal is a long the posterolateral rods at the expected locations of C5, C6, and C7 in addition to the above-described facet screws. The third image at 4:20 p.m. is a frontal projection demonstrating posterolateral rod and facet screw fixators at C3, C4, C5, C6, and C7. Intubation noted. IMPRESSION: 1. Intraoperative images depicting  the posterolateral rod and facet screw fixation at C3-C4-C5-C6-C7. No complicating feature is observed on these fluoroscopic spot images. Electronically Signed   By: Van Clines M.D.   On: 10/22/2015 16:38   Dg C-arm 1-60 Min  10/22/2015  CLINICAL DATA:  C3 through C7 cervical fusion. EXAM: DG C-ARM 61-120 MIN; CERVICAL SPINE - 2-3 VIEW COMPARISON:  09/28/2015 FINDINGS: A series of 3 intraoperative images of the cervical spine are obtained. The first at 4:18 p.m. demonstrates tissue spreaders in place posterior to C3-4, with facet screw and posterolateral rod hardware involving C3, C4, and extending caudad. The next image obtained at 4:19 is a lateral projection with obscured vertebral margins due to the presence of the shoulders. The tissue spreaders are observed as a landmark, and there are screw attachment terminal is a long the posterolateral rods at the expected locations of C5, C6, and C7 in addition to the above-described facet screws. The third image at 4:20 p.m. is a frontal projection demonstrating posterolateral rod and facet screw fixators at C3, C4, C5, C6, and C7. Intubation noted. IMPRESSION: 1. Intraoperative images depicting the posterolateral rod and facet screw fixation at C3-C4-C5-C6-C7. No complicating feature is observed on these fluoroscopic spot images. Electronically Signed   By: Van Clines M.D.   On: 10/22/2015 16:38    Assessment/Plan: Postop day #1: We will mobilize the patient. We will discontinue her Hemovac. She may go home tomorrow.  LOS: 1 day     Eleni Frank D 10/23/2015, 7:38 AM

## 2015-10-24 LAB — GLUCOSE, CAPILLARY
GLUCOSE-CAPILLARY: 223 mg/dL — AB (ref 65–99)
GLUCOSE-CAPILLARY: 248 mg/dL — AB (ref 65–99)
Glucose-Capillary: 232 mg/dL — ABNORMAL HIGH (ref 65–99)
Glucose-Capillary: 211 mg/dL — ABNORMAL HIGH (ref 65–99)

## 2015-10-24 MED ORDER — DIAZEPAM 5 MG PO TABS
5.0000 mg | ORAL_TABLET | Freq: Four times a day (QID) | ORAL | Status: DC | PRN
  Administered 2015-10-24 – 2015-10-27 (×6): 5 mg via ORAL
  Filled 2015-10-24 (×6): qty 1

## 2015-10-24 NOTE — Progress Notes (Signed)
Subjective: Patient reports sore in neck with muscular discomfort  Objective: Vital signs in last 24 hours: Temp:  [98 F (36.7 C)-98.7 F (37.1 C)] 98.6 F (37 C) (04/29 0605) Pulse Rate:  [92-99] 99 (04/29 0605) Resp:  [18-20] 18 (04/29 0605) BP: (98-143)/(48-79) 114/63 mmHg (04/29 0605) SpO2:  [94 %-100 %] 94 % (04/29 0605)  Intake/Output from previous day:   Intake/Output this shift:    Physical Exam: Weak in both hands.  Dressing CDI.  Lab Results: No results for input(s): WBC, HGB, HCT, PLT in the last 72 hours. BMET No results for input(s): NA, K, CL, CO2, GLUCOSE, BUN, CREATININE, CALCIUM in the last 72 hours.  Studies/Results: Dg Cervical Spine 2-3 Views  10/22/2015  CLINICAL DATA:  C3 through C7 cervical fusion. EXAM: DG C-ARM 61-120 MIN; CERVICAL SPINE - 2-3 VIEW COMPARISON:  09/28/2015 FINDINGS: A series of 3 intraoperative images of the cervical spine are obtained. The first at 4:18 p.m. demonstrates tissue spreaders in place posterior to C3-4, with facet screw and posterolateral rod hardware involving C3, C4, and extending caudad. The next image obtained at 4:19 is a lateral projection with obscured vertebral margins due to the presence of the shoulders. The tissue spreaders are observed as a landmark, and there are screw attachment terminal is a long the posterolateral rods at the expected locations of C5, C6, and C7 in addition to the above-described facet screws. The third image at 4:20 p.m. is a frontal projection demonstrating posterolateral rod and facet screw fixators at C3, C4, C5, C6, and C7. Intubation noted. IMPRESSION: 1. Intraoperative images depicting the posterolateral rod and facet screw fixation at C3-C4-C5-C6-C7. No complicating feature is observed on these fluoroscopic spot images. Electronically Signed   By: Van Clines M.D.   On: 10/22/2015 16:38   Dg C-arm 1-60 Min  10/22/2015  CLINICAL DATA:  C3 through C7 cervical fusion. EXAM: DG C-ARM  61-120 MIN; CERVICAL SPINE - 2-3 VIEW COMPARISON:  09/28/2015 FINDINGS: A series of 3 intraoperative images of the cervical spine are obtained. The first at 4:18 p.m. demonstrates tissue spreaders in place posterior to C3-4, with facet screw and posterolateral rod hardware involving C3, C4, and extending caudad. The next image obtained at 4:19 is a lateral projection with obscured vertebral margins due to the presence of the shoulders. The tissue spreaders are observed as a landmark, and there are screw attachment terminal is a long the posterolateral rods at the expected locations of C5, C6, and C7 in addition to the above-described facet screws. The third image at 4:20 p.m. is a frontal projection demonstrating posterolateral rod and facet screw fixators at C3, C4, C5, C6, and C7. Intubation noted. IMPRESSION: 1. Intraoperative images depicting the posterolateral rod and facet screw fixation at C3-C4-C5-C6-C7. No complicating feature is observed on these fluoroscopic spot images. Electronically Signed   By: Van Clines M.D.   On: 10/22/2015 16:38    Assessment/Plan: Doing well.  Muscular spasms.  Will start valium.  Continue PT for persistent symptoms of myelopathy.    LOS: 2 days    Peggyann Shoals, MD 10/24/2015, 7:04 AM

## 2015-10-24 NOTE — Progress Notes (Signed)
Occupational Therapy Treatment Patient Details Name: [name] MRN: [id number] DOB: 11-Feb-1961 Today's Date: 10/24/2015    History of present illness Patient is a 55 y/o female admitted with cervical spondolytic myelopathy now s/p decompressive laminectomy C3-4, C4-5, C5-6 and C6-7 as well as removal of synovial cyst and posterior cervical fusion C3-7.  PMH positive for HTN, HLD, DM w/ retinopathy and neuropathy, GERD, nonobstructive CAD and CHF.   OT comments  Pt. Agreeable to participation in skilled OT but remained limited with mobility and task completion secondary to c/o 10/10 pain.  meds given at beginning of session.  Focused on bed mobility strategies this session.  Pt. Still relies heavily on bed rails to initiate rolling.  Will continue to follow acutely. Will address ADLS next session as pt. Able.  Follow Up Recommendations  No OT follow up;Supervision/Assistance - 24 hour    Equipment Recommendations  None recommended by OT    Recommendations for Other Services      Precautions / Restrictions Precautions Precautions: Fall;Cervical Other Brace/Splint: No brace per chart       Mobility Bed Mobility Overal bed mobility: Needs Assistance Bed Mobility: Rolling;Sidelying to Sit;Sit to Sidelying Rolling: Min assist Sidelying to sit: Min assist     Sit to sidelying: Min assist General bed mobility comments: HOB Flat, exits from left side, unable to complete without use of rails. attempted multiple times before pt. was able to complete  Transfers                      Balance                                   ADL Overall ADL's : Needs assistance/impaired                                              Vision                     Perception     Praxis      Cognition   Behavior During Therapy: WFL for tasks assessed/performed Overall Cognitive Status: Within Functional Limits for tasks assessed        Memory: Decreased recall of precautions               Extremity/Trunk Assessment               Exercises     Shoulder Instructions       General Comments      Pertinent Vitals/ Pain       Pain Assessment: 0-10 Pain Score: 10-Worst pain ever Pain Location: neck with mobility Pain Descriptors / Indicators: Aching Pain Intervention(s): Limited activity within patient's tolerance;Monitored during session;Premedicated before session;Repositioned  Home Living                                          Prior Functioning/Environment              Frequency Min 2X/week     Progress Toward Goals  OT Goals(current goals can now be found in the care plan section)  Progress towards OT goals: Progressing toward goals  Plan Discharge plan remains appropriate    Co-evaluation                 End of Session     Activity Tolerance Patient limited by pain   Patient Left in bed;with call bell/phone within reach   Nurse Communication          Time: 0950-1010 OT Time Calculation (min): 20 min  Charges: OT General Charges $OT Visit: 1 Procedure OT Treatments $Therapeutic Activity: 8-22 mins  Janice Coffin, COTA/L 10/24/2015, 10:17 AM

## 2015-10-24 NOTE — Anesthesia Postprocedure Evaluation (Signed)
Anesthesia Post Note  Patient: [name]  Procedure(s) Performed: Procedure(s) (LRB): Cervical three-Cervical Seven Posterior cervical fusion with lateral mass fixation,  Laminectomy Cervical Three-Cervical Seven (N/A)  Patient location during evaluation: PACU Anesthesia Type: General Level of consciousness: awake Pain management: pain level controlled Vital Signs Assessment: post-procedure vital signs reviewed and stable Respiratory status: spontaneous breathing Cardiovascular status: stable Postop Assessment: no signs of nausea or vomiting Anesthetic complications: no    Last Vitals:  Filed Vitals:   10/23/15 2118 10/24/15 0115  BP: 98/48 101/60  Pulse: 94 92  Temp: 36.7 C 37 C  Resp: 20 18    Last Pain:  Filed Vitals:   10/24/15 0138  PainSc: Asleep                 Hameed Kolar

## 2015-10-24 NOTE — Progress Notes (Signed)
Physical Therapy Treatment Patient Details Name: [name] MRN: [id number] DOB: 1960/10/28 Today's Date: 10/24/2015    History of Present Illness Patient is a 55 y/o female admitted with cervical spondolytic myelopathy now s/p decompressive laminectomy C3-4, C4-5, C5-6 and C6-7 as well as removal of synovial cyst and posterior cervical fusion C3-7.  PMH positive for HTN, HLD, DM w/ retinopathy and neuropathy, GERD, nonobstructive CAD and CHF.    PT Comments    Patient progressing slowly towards PT goals. Continues to be limited by pain. Requires encouragement to participate and needs to be premedicated prior to mobility. Might benefit from soft collar to support neck as pt with difficulty keeping head up during gait. Encouraged mobility. Will follow acutely.   Follow Up Recommendations  No PT follow up     Equipment Recommendations  None recommended by PT    Recommendations for Other Services       Precautions / Restrictions Precautions Precautions: Fall;Cervical Precaution Comments: Reviewed precautions. Other Brace/Splint: No brace per chart; pt would like soft collar for support Restrictions Weight Bearing Restrictions: No    Mobility  Bed Mobility Overal bed mobility: Needs Assistance Bed Mobility: Rolling;Sidelying to Sit Rolling: Supervision Sidelying to sit: Min guard;HOB elevated       General bed mobility comments: Increased time and pain. Cues for log roll. Heavy use of rail.  Transfers Overall transfer level: Needs assistance Equipment used: Rolling walker (2 wheeled) Transfers: Sit to/from Stand Sit to Stand: Min guard         General transfer comment: Min guard for safety. Stood from [employer]. Transferred to chair post ambulation bout.  Ambulation/Gait Ambulation/Gait assistance: Min guard Ambulation Distance (Feet): 80 Feet Assistive device: Rolling walker (2 wheeled) Gait Pattern/deviations: Step-through pattern;Decreased stride length;Wide  base of support;Trunk flexed   Gait velocity interpretation: Below normal speed for age/gender General Gait Details: Difficulty keeping head up, c/o pain in shoulders, neck and arms so limited gait distance.   Stairs            Wheelchair Mobility    Modified Rankin (Stroke Patients Only)       Balance Overall balance assessment: Needs assistance Sitting-balance support: Feet supported;No upper extremity supported Sitting balance-Leahy Scale: Fair     Standing balance support: During functional activity Standing balance-Leahy Scale: Fair Standing balance comment: ABle to perform within room ambulation carrying bag with 1 UE support.                     Cognition Arousal/Alertness: Awake/alert Behavior During Therapy: WFL for tasks assessed/performed Overall Cognitive Status: Within Functional Limits for tasks assessed       Memory: Decreased recall of precautions              Exercises      General Comments        Pertinent Vitals/Pain Pain Assessment: 0-10 Pain Score: 10-Worst pain ever Pain Location: neck with mobility Pain Descriptors / Indicators: Sore;Aching Pain Intervention(s): Monitored during session;Repositioned;Premedicated before session;Limited activity within patient's tolerance    Home Living                      Prior Function            PT Goals (current goals can now be found in the care plan section) Progress towards PT goals: Progressing toward goals (slowly)    Frequency  Min 5X/week    PT Plan Current plan remains appropriate  Co-evaluation             End of Session Equipment Utilized During Treatment: Gait belt Activity Tolerance: Patient limited by pain Patient left: in chair;with call bell/phone within reach     Time: 1430-1446 PT Time Calculation (min) (ACUTE ONLY): 16 min  Charges:  $Gait Training: 8-22 mins                    G Codes:      [name] A Rainbow Salman 10/24/2015, 2:55  PM Wray Kearns, [place], DPT [phone]

## 2015-10-25 LAB — GLUCOSE, CAPILLARY
GLUCOSE-CAPILLARY: 208 mg/dL — AB (ref 65–99)
Glucose-Capillary: 261 mg/dL — ABNORMAL HIGH (ref 65–99)
Glucose-Capillary: 250 mg/dL — ABNORMAL HIGH (ref 65–99)
Glucose-Capillary: 244 mg/dL — ABNORMAL HIGH (ref 65–99)

## 2015-10-25 NOTE — Progress Notes (Signed)
Physical Therapy Treatment Patient Details Name: [name] MRN: [id number] DOB: [birth year] Today's Date: 10/25/2015    History of Present Illness Patient is a 55 y/o female admitted with cervical spondolytic myelopathy now s/p decompressive laminectomy C3-4, C4-5, C5-6 and C6-7 as well as removal of synovial cyst and posterior cervical fusion C3-7.  PMH positive for HTN, HLD, DM w/ retinopathy and neuropathy, GERD, nonobstructive CAD and CHF.    PT Comments    Pt limited by dizziness and pain this date. Suspect lethargy and "dizziness" was from recent pain medicine administration. Pt unable to hold head up due to pain, pt to benefit from soft collar for support when in standing/walking. Pt slowly progressing towards all goals. If pt doesn't progress may need HHPT.  Follow Up Recommendations  No PT follow up     Equipment Recommendations  None recommended by PT    Recommendations for Other Services       Precautions / Restrictions Precautions Precautions: Fall;Cervical Precaution Comments: pt unable to recall precautions, reviewed again Other Brace/Splint: no Restrictions Weight Bearing Restrictions: No    Mobility  Bed Mobility               General bed mobility comments: pt up in chair  Transfers Overall transfer level: Needs assistance Equipment used: Rolling walker (2 wheeled) Transfers: Sit to/from Stand Sit to Stand: Min assist         General transfer comment: minA for safety, max v/c's to not pull up on walker, minA for walker management  Ambulation/Gait Ambulation/Gait assistance: Min assist Ambulation Distance (Feet): 80 Feet Assistive device: Rolling walker (2 wheeled) Gait Pattern/deviations: Step-through pattern;Decreased stride length;Shuffle Gait velocity: dec Gait velocity interpretation: Below normal speed for age/gender General Gait Details: v/c's to pick up feet and increase step length. pt frequently stopping due to inability to hold  head up and "im lightheaded, dizzy" pt's BP 108/58   Stairs            Wheelchair Mobility    Modified Rankin (Stroke Patients Only)       Balance Overall balance assessment: Needs assistance         Standing balance support: During functional activity Standing balance-Leahy Scale: Fair Standing balance comment: requires RW for safe amb                    Cognition Arousal/Alertness: Lethargic;Suspect due to medications (pt sleepy upon arrival, v/c's to maintain eye opening) Behavior During Therapy: Flat affect Overall Cognitive Status: Within Functional Limits for tasks assessed                      Exercises      General Comments General comments (skin integrity, edema, etc.): pt sleepy, v/c's to maintain eye opening      Pertinent Vitals/Pain Pain Assessment: 0-10 Pain Score: 8  Pain Location: neck Pain Intervention(s): Monitored during session    Home Living                      Prior Function            PT Goals (current goals can now be found in the care plan section) Acute Rehab PT Goals Patient Stated Goal: to be able to hold my head up Progress towards PT goals: Progressing toward goals    Frequency  Min 5X/week    PT Plan Current plan remains appropriate    Co-evaluation  End of Session Equipment Utilized During Treatment: Gait belt Activity Tolerance: Patient limited by pain Patient left: in chair;with call bell/phone within reach;with chair alarm set     Time: [phone] PT Time Calculation (min) (ACUTE ONLY): 21 min  Charges:  $Gait Training: 8-22 mins                    G Codes:      Kingsley Callander 10/25/2015, 8:46 AM   Kittie Plater, PT, DPT Pager #: [phone] Office #: [phone]

## 2015-10-25 NOTE — Progress Notes (Signed)
Patient ID: [name], female   DOB: [birth year], 55 y.o.   MRN: [id number] BP 121/63 mmHg  Pulse 90  Temp(Src) 97.7 F (36.5 C) (Oral)  Resp 20  Ht 5\' 6"  (1.676 m)  Wt 118.389 kg (261 lb)  BMI 42.15 kg/m2  SpO2 100%  LMP 02/13/2011 Alert and oriented x 4, speech is clear and fluent Moving upper extremities well Wound is clean, some sanguineous drainage. Will change steristrips, and dressing Needs more pt

## 2015-10-26 ENCOUNTER — Other Ambulatory Visit: Payer: Self-pay | Admitting: *Deleted

## 2015-10-26 ENCOUNTER — Encounter: Payer: Self-pay | Admitting: *Deleted

## 2015-10-26 LAB — GLUCOSE, CAPILLARY
GLUCOSE-CAPILLARY: 219 mg/dL — AB (ref 65–99)
GLUCOSE-CAPILLARY: 127 mg/dL — AB (ref 65–99)
Glucose-Capillary: 223 mg/dL — ABNORMAL HIGH (ref 65–99)

## 2015-10-26 NOTE — Consult Note (Signed)
   [facility] CM Inpatient Consult   10/26/2015  [name] [birth year] [id number]   Patient evaluated for long-term disease management services with [facility] program. Martin Majestic to bedside to discuss and offer [facility] services. Patient is agreeable and written consent signed. Explained to patient that he/she will receive post hospital transition of care calls and will be evaluated for monthly home visits. Confirmed Primary Care MD as Dr Benjamine Mola with [facility].  Confirmed best contact number as [phone]. Explained primary [facility] primary focus will be disease and symptom management for DM. She denies having issues with affording medication or with transportation.  Left [facility] Care Management packet and contact information at bedside. Will make inpatient RNCM aware that patient will be followed by [facility] post hospital discharge.    Marthenia Rolling, MSN-Ed, RN,BSN [facility] Liaison [phone]

## 2015-10-26 NOTE — Care Management Note (Signed)
Case Management Note  Patient Details  Name: [name] MRN: [id number] Date of Birth: August 13, 1960  Subjective/Objective:                    Action/Plan: Plan is for discharge to home tomorrow. CM continuing to follow for d/c needs.   Expected Discharge Date:                  Expected Discharge Plan:     In-House Referral:     Discharge planning Services     Post Acute Care Choice:    Choice offered to:     DME Arranged:    DME Agency:     HH Arranged:    HH Agency:     Status of Service:  In process, will continue to follow  Medicare Important Message Given:  Yes Date Medicare IM Given:    Medicare IM give by:    Date Additional Medicare IM Given:    Additional Medicare Important Message give by:     If discussed at [place] of Stay Meetings, dates discussed:    Additional Comments:  Pollie Friar, RN 10/26/2015, 3:21 PM

## 2015-10-26 NOTE — Progress Notes (Signed)
Occupational Therapy Treatment Patient Details Name: [name] MRN: [id number] DOB: 10/03/1960 Today's Date: 10/26/2015    History of present illness Patient is a 55 y.o. female admitted with cervical spondolytic myelopathy now s/p decompressive laminectomy C3-4, C4-5, C5-6 and C6-7 as well as removal of synovial cyst and posterior cervical fusion C3-7.  PMH positive for HTN, HLD, DM w/ retinopathy and neuropathy, GERD, nonobstructive CAD and CHF.   OT comments  Pt progressing. Education provided in session. Will continue to follow acutely to help increase independence prior to d/c. Plan to practice tub transfer next session and LB dressing with AE.   Follow Up Recommendations  No OT follow up;Supervision - Intermittent    Equipment Recommendations  None recommended by OT    Recommendations for Other Services      Precautions / Restrictions Precautions Precautions: Fall;Cervical Precaution Comments: cues to try to keep neck straight Required Braces or Orthoses: Cervical Brace Cervical Brace: Soft collar Restrictions Weight Bearing Restrictions: No       Mobility Bed Mobility               General bed mobility comments: pt in chair upon arrival  Transfers Overall transfer level: Needs assistance Transfers: Sit to/from Stand Sit to Stand: Min guard         General transfer comment: RW in front of pt    Balance Performed functional tasks standing at sink without LOB.              ADL Overall ADL's : Needs assistance/impaired     Grooming: Wash/dry face;Applying deodorant;Set up;Supervision/safety;Standing (also applied body spray)               Lower Body Dressing: Set up;Supervision/safety;Sitting/lateral leans;With adaptive equipment Lower Body Dressing Details (indicate cue type and reason): donned/doffed sock with reacher and sock aid; OT assisted donning sock before pt practiced with AE. Toilet Transfer: Min guard;Ambulation;RW (sit to  stand from chair)           Functional mobility during ADLs: Min guard;Rolling walker General ADL Comments: Educated on LB ADL technique and educated on AE. Pt having difficulty crossing right leg over knee and appeared painful. Educated on tub transfer techniques and recommended someone being with her for tub transfer and for bathing.  OT adjusted cervical collar. Discussed incorporating precautions into functional activities.       Vision                     Perception     Praxis      Cognition  Awake/Alert Behavior During Therapy: Flat affect;WFL for tasks assessed/performed Overall Cognitive Status: Within Functional Limits for tasks assessed                       Extremity/Trunk Assessment               Exercises     Shoulder Instructions       General Comments      Pertinent Vitals/ Pain       Pain Assessment: 0-10 Pain Score: 6  Pain Location: neck as well as shoulders with movement  Pain Descriptors / Indicators: Sore;Throbbing Pain Intervention(s): Monitored during session  Home Living                                          Prior  Functioning/Environment              Frequency Min 2X/week     Progress Toward Goals  OT Goals(current goals can now be found in the care plan section)  Progress towards OT goals: Progressing toward goals-added a goal  Acute Rehab OT Goals Patient Stated Goal: not stated OT Goal Formulation: With patient Time For Goal Achievement: 11/06/15 Potential to Achieve Goals: Good ADL Goals Pt Will Perform Grooming: with supervision;standing Pt Will Perform Lower Body Dressing: with set-up;with supervision;with adaptive equipment;sit to/from stand Pt Will Transfer to Toilet: with supervision;ambulating;bedside commode (BSC over toilet) Pt Will Perform Toileting - Clothing Manipulation and hygiene: with supervision;sit to/from stand Pt Will Perform Tub/Shower Transfer: Tub  transfer;with min guard assist;ambulating;shower seat;rolling walker  Plan Discharge plan needs to be updated    Co-evaluation                 End of Session Equipment Utilized During Treatment: Rolling walker;Other (comment) (AE)   Activity Tolerance Patient tolerated treatment well   Patient Left in chair;with call bell/phone within reach;with nursing/sitter in room   Nurse Communication          Time: 1000-1018 OT Time Calculation (min): 18 min  Charges: OT General Charges $OT Visit: 1 Procedure OT Treatments $Self Care/Home Management : 8-22 mins  Benito Mccreedy OTR/L [id number] 10/26/2015, 10:32 AM

## 2015-10-26 NOTE — Progress Notes (Signed)
Physical Therapy Treatment Patient Details Name: [name] MRN: [id number] DOB: 07/29/1960 Today's Date: 10/26/2015    History of Present Illness Patient is a 55 y/o female admitted with cervical spondolytic myelopathy now s/p decompressive laminectomy C3-4, C4-5, C5-6 and C6-7 as well as removal of synovial cyst and posterior cervical fusion C3-7.  PMH positive for HTN, HLD, DM w/ retinopathy and neuropathy, GERD, nonobstructive CAD and CHF.    PT Comments    Pt improving with mobility during PT sessions. Cadence remains slow but steady when using rw. Pt able to go up/down 4 steps with min guard assistance. PT to continue to follow and progress her mobility as tolerated in anticipation of D/C to home when medically stable.    Follow Up Recommendations  No PT follow up;Supervision for mobility/OOB     Equipment Recommendations  None recommended by PT    Recommendations for Other Services       Precautions / Restrictions Precautions Precautions: Fall;Cervical Other Brace/Splint: soft collar as needed.  Restrictions Weight Bearing Restrictions: No    Mobility  Bed Mobility               General bed mobility comments: up in chair upon arrival  Transfers Overall transfer level: Needs assistance Equipment used: Rolling walker (2 wheeled) Transfers: Sit to/from Stand Sit to Stand: Min guard         General transfer comment: min guard for safety.  Ambulation/Gait Ambulation/Gait assistance: Min guard Ambulation Distance (Feet): 200 Feet Assistive device: Rolling walker (2 wheeled) Gait Pattern/deviations: Step-through pattern Gait velocity: decreased   General Gait Details: pt with tendency to run into objects with rw, cues and assist provided as needed.    Stairs Stairs: Yes Stairs assistance: Min guard Stair Management: Two rails;Step to pattern;Forwards Number of Stairs: 4 General stair comments: slow but steady pattern.   Wheelchair Mobility     Modified Rankin (Stroke Patients Only)       Balance Overall balance assessment: Needs assistance Sitting-balance support: Feet supported Sitting balance-Leahy Scale: Good     Standing balance support: During functional activity Standing balance-Leahy Scale: Fair Standing balance comment: using rw for ambulation                    Cognition Arousal/Alertness: Awake/alert Behavior During Therapy: Flat affect Overall Cognitive Status: Within Functional Limits for tasks assessed                      Exercises      General Comments        Pertinent Vitals/Pain Pain Assessment: 0-10 Pain Score: 8  Pain Location: neck and shoulders Pain Descriptors / Indicators: Aching Pain Intervention(s): Monitored during session;Limited activity within patient's tolerance    Home Living                      Prior Function            PT Goals (current goals can now be found in the care plan section) Acute Rehab PT Goals Patient Stated Goal: get stronger and be able to do more PT Goal Formulation: With patient Time For Goal Achievement: 10/28/15 Potential to Achieve Goals: Good Progress towards PT goals: Progressing toward goals    Frequency  Min 5X/week    PT Plan Current plan remains appropriate    Co-evaluation             End of Session Equipment Utilized During Treatment:  Gait belt Activity Tolerance: Patient limited by fatigue Patient left: in chair;with call bell/phone within reach     Time: 0917-0933 PT Time Calculation (min) (ACUTE ONLY): 16 min  Charges:  $Gait Training: 8-22 mins                    G Codes:      Cassell Clement, PT, CSCS Pager [phone] Office [phone]  10/26/2015, 10:18 AM

## 2015-10-26 NOTE — Progress Notes (Signed)
Inpatient Diabetes Program Recommendations  AACE/ADA: New Consensus Statement on Inpatient Glycemic Control (2015)  Target Ranges:  Prepandial:   less than 140 mg/dL      Peak postprandial:   less than 180 mg/dL (1-2 hours)      Critically ill patients:  140 - 180 mg/dL   Review of Glycemic Control  Inpatient Diabetes Program Recommendations:  Insulin - Basal: Increase Lantus to 52 units  Thank you  Raoul Pitch BSN, RN,CDE Inpatient Diabetes Coordinator [phone] (team pager)

## 2015-10-26 NOTE — Care Management Important Message (Signed)
Important Message  Patient Details  Name: [name] MRN: [id number] Date of Birth: [birth year]   Medicare Important Message Given:  Yes    Clarinda Obi P Lauraine Crespo 10/26/2015, 1:56 PM

## 2015-10-26 NOTE — Progress Notes (Signed)
Patient ID: [name], female   DOB: 1960/08/20, 55 y.o.   MRN: [id number] Subjective: Patient reports she is walking okay and using her hands well. Still has neck soreness. This is expected.  Objective: Vital signs in last 24 hours: Temp:  [97.7 F (36.5 C)-98.2 F (36.8 C)] 98 F (36.7 C) (05/01 0635) Pulse Rate:  [88-95] 95 (05/01 0635) Resp:  [18-22] 22 (05/01 0635) BP: (109-141)/(53-63) 123/54 mmHg (05/01 0635) SpO2:  [95 %-100 %] 95 % (05/01 0635)  Intake/Output from previous day:   Intake/Output this shift:    Neurologic: Grossly normal  Lab Results: Lab Results  Component Value Date   WBC 10.4 10/15/2015   HGB 10.9* 10/15/2015   HCT 35.0* 10/15/2015   MCV 88.2 10/15/2015   PLT 269 10/15/2015   Lab Results  Component Value Date   INR 0.99 10/15/2015   BMET Lab Results  Component Value Date   NA 138 10/15/2015   K 4.3 10/15/2015   CL 105 10/15/2015   CO2 20* 10/15/2015   GLUCOSE 229* 10/15/2015   BUN 18 10/15/2015   CREATININE 0.97 10/15/2015   CALCIUM 9.4 10/15/2015    Studies/Results: No results found.  Assessment/Plan: Seems to be making the expected recovery period would like to stay 1 more day for more therapy. I don't think that is unreasonable.   LOS: 4 days    JONES,DAVID S 10/26/2015, 8:37 AM

## 2015-10-27 LAB — GLUCOSE, CAPILLARY
Glucose-Capillary: 148 mg/dL — ABNORMAL HIGH (ref 65–99)
Glucose-Capillary: 166 mg/dL — ABNORMAL HIGH (ref 65–99)

## 2015-10-27 MED ORDER — CYCLOBENZAPRINE HCL 10 MG PO TABS: 10.0000 mg | ORAL_TABLET | Freq: Three times a day (TID) | ORAL | Status: DC | PRN

## 2015-10-27 MED ORDER — OXYCODONE-ACETAMINOPHEN 5-325 MG PO TABS: 1.0000 | ORAL_TABLET | Freq: Four times a day (QID) | ORAL | Status: DC | PRN

## 2015-10-27 NOTE — Progress Notes (Signed)
Pt discharged at this time with daughter and soft collar on.  No complaints verbalized.  Anxious for discharge.  Pt verbalizes understanding of discharge instructions and prescription given.  Pt and daughter state they have all belongings.

## 2015-10-27 NOTE — Discharge Summary (Signed)
Physician Discharge Summary  Patient ID: [name] MRN: [id number] DOB/AGE: Nov 13, 1960 55 y.o.  Admit date: 10/22/2015 Discharge date: 10/27/2015  Admission Diagnoses: cervical stenosis    Discharge Diagnoses: same   Discharged Condition: good  Hospital Course: The patient was admitted on 10/22/2015 and taken to the operating room where the patient underwent CL, PCF C3-7. The patient tolerated the procedure well and was taken to the recovery room and then to the floor in stable condition. The hospital course was routine. There were no complications. The wound remained clean dry and intact. Pt had appropriate neck soreness. No complaints of arm pain or new N/T/W. The patient remained afebrile with stable vital signs, and tolerated a regular diet. The patient continued to increase activities, and pain was well controlled with oral pain medications.   Consults: None  Significant Diagnostic Studies:  Results for orders placed or performed during the hospital encounter of 10/22/15  Glucose, capillary  Result Value Ref Range   Glucose-Capillary 111 (H) 65 - 99 mg/dL  Glucose, capillary  Result Value Ref Range   Glucose-Capillary 126 (H) 65 - 99 mg/dL   Comment 1 Notify RN    Comment 2 Document in Chart   Glucose, capillary  Result Value Ref Range   Glucose-Capillary 357 (H) 65 - 99 mg/dL   Comment 1 Notify RN    Comment 2 Document in Chart   Glucose, capillary  Result Value Ref Range   Glucose-Capillary 337 (H) 65 - 99 mg/dL   Comment 1 Notify RN    Comment 2 Document in Chart   Glucose, capillary  Result Value Ref Range   Glucose-Capillary 369 (H) 65 - 99 mg/dL   Comment 1 Notify RN    Comment 2 Document in Chart   Glucose, capillary  Result Value Ref Range   Glucose-Capillary 259 (H) 65 - 99 mg/dL  Glucose, capillary  Result Value Ref Range   Glucose-Capillary 229 (H) 65 - 99 mg/dL   Comment 1 Notify RN    Comment 2 Document in Chart   Glucose, capillary  Result  Value Ref Range   Glucose-Capillary 232 (H) 65 - 99 mg/dL   Comment 1 Notify RN    Comment 2 Document in Chart   Glucose, capillary  Result Value Ref Range   Glucose-Capillary 223 (H) 65 - 99 mg/dL  Glucose, capillary  Result Value Ref Range   Glucose-Capillary 211 (H) 65 - 99 mg/dL  Glucose, capillary  Result Value Ref Range   Glucose-Capillary 248 (H) 65 - 99 mg/dL  Glucose, capillary  Result Value Ref Range   Glucose-Capillary 261 (H) 65 - 99 mg/dL   Comment 1 Notify RN    Comment 2 Document in Chart   Glucose, capillary  Result Value Ref Range   Glucose-Capillary 208 (H) 65 - 99 mg/dL  Glucose, capillary  Result Value Ref Range   Glucose-Capillary 250 (H) 65 - 99 mg/dL  Glucose, capillary  Result Value Ref Range   Glucose-Capillary 244 (H) 65 - 99 mg/dL  Glucose, capillary  Result Value Ref Range   Glucose-Capillary 223 (H) 65 - 99 mg/dL  Glucose, capillary  Result Value Ref Range   Glucose-Capillary 127 (H) 65 - 99 mg/dL  Glucose, capillary  Result Value Ref Range   Glucose-Capillary 219 (H) 65 - 99 mg/dL  Glucose, capillary  Result Value Ref Range   Glucose-Capillary 148 (H) 65 - 99 mg/dL   Comment 1 Notify RN    Comment 2 Document  in Chart   Glucose, capillary  Result Value Ref Range   Glucose-Capillary 166 (H) 65 - 99 mg/dL   Comment 1 Notify RN    Comment 2 Document in Chart     Dg Cervical Spine 2-3 Views  10/22/2015  CLINICAL DATA:  C3 through C7 cervical fusion. EXAM: DG C-ARM 61-120 MIN; CERVICAL SPINE - 2-3 VIEW COMPARISON:  09/28/2015 FINDINGS: A series of 3 intraoperative images of the cervical spine are obtained. The first at 4:18 p.m. demonstrates tissue spreaders in place posterior to C3-4, with facet screw and posterolateral rod hardware involving C3, C4, and extending caudad. The next image obtained at 4:19 is a lateral projection with obscured vertebral margins due to the presence of the shoulders. The tissue spreaders are observed as a landmark,  and there are screw attachment terminal is a long the posterolateral rods at the expected locations of C5, C6, and C7 in addition to the above-described facet screws. The third image at 4:20 p.m. is a frontal projection demonstrating posterolateral rod and facet screw fixators at C3, C4, C5, C6, and C7. Intubation noted. IMPRESSION: 1. Intraoperative images depicting the posterolateral rod and facet screw fixation at C3-C4-C5-C6-C7. No complicating feature is observed on these fluoroscopic spot images. Electronically Signed   By: Van Clines M.D.   On: 10/22/2015 16:38   Mr Cervical Spine Wo Contrast  09/29/2015  ADDENDUM REPORT: 09/29/2015 09:09 ADDENDUM: Critical Value/emergent results were called by telephone at the time of interpretation on 09/29/2015 at 9:08 am to Dr. Vernelle Emerald who verbally acknowledged these results. Electronically Signed   By: Genevie Ann M.D.   On: 09/29/2015 09:09  09/29/2015  CLINICAL DATA:  55 year old female with right side neck pain for 1 month radiating to the right shoulder. Query paraspinal myositis or inflammatory process. Elevated ESR and CRP. Initial encounter. EXAM: MRI CERVICAL SPINE WITHOUT CONTRAST TECHNIQUE: Multiplanar, multisequence MR imaging of the cervical spine was performed. No intravenous contrast was administered. COMPARISON:  Brain MRI and intracranial MRA a 10/24/2014. FINDINGS: Straightening of cervical lordosis. No marrow edema or evidence of acute osseous abnormality. Normal signal in the paraspinal soft tissues. No paraspinal muscle edema. No prevertebral fluid or edema. Cervicomedullary junction is within normal limits. There is low level abnormal cervical spinal cord T2 and STIR signal abnormality from C2-C3 to C4-C5, more apparent on sagittal STIR imaging (series 4, image 7). This occurs in the setting of widespread cervical spinal stenosis and cord mass effect, detailed below. C2-C3: Mild disc bulge. Mild to moderate ligament flavum hypertrophy.  Spinal stenosis with no spinal cord mass effect. C3-C4: Circumferential disc osteophyte complex with broad-based lobulated posterior component. Moderate to severe ligament flavum hypertrophy, greater on the right where fluid signal (series 2, image 6 and series 6, image 8) may indicate degenerative synovial cyst. Spinal stenosis with moderate circumferential spinal cord mass effect (series 6, image 9). Severe right and moderate left C4 foraminal stenosis. C4-C5: Circumferential disc osteophyte complex with broad-based posterior component. Left greater than right uncovertebral and ligament flavum hypertrophy. Fluid signal on the left (series 6, image 12) suspicious for synovial cyst. Spinal stenosis with mild to moderate spinal cord mass effect, mostly on the left hemi cord (series 6, image 13). Moderate bilateral C5 foraminal stenosis. C5-C6: Circumferential disc osteophyte complex with broad-based posterior component. Mild ligament flavum hypertrophy. Mild to moderate facet hypertrophy. Mild spinal stenosis, no cord mass effect. Moderate to severe left greater than right C6 foraminal stenosis. C6-C7: Circumferential disc osteophyte complex. Right  eccentric broad-based posterior component. Moderate to severe ligament flavum hypertrophy also slightly greater on the right. Spinal stenosis with borderline to mild spinal cord mass effect. Only mild bilateral C7 foraminal stenosis results. C7-T1:  Mild facet and ligament flavum hypertrophy.  No stenosis. No upper thoracic spinal or foraminal stenosis. There is mild thoracic epidural lipomatosis. IMPRESSION: 1. Multifactorial moderate cervical spinal stenosis with spinal cord mass effect at C3-C4 and C4-C5. Associated spinal cord signal abnormality at both levels which could be cord edema or myelomalacia in this setting. 2. Lesser degenerative cervical spinal stenosis elsewhere, sparing the C7-T1 level. No other abnormal spinal cord signal. 3. Multifactorial moderate or  severe neural foraminal stenosis at the right greater than left C4, bilateral C5, and left greater than right C6 nerve levels. Electronically Signed: By: Genevie Ann M.D. On: 09/28/2015 17:47   Dg C-arm 1-60 Min  10/22/2015  CLINICAL DATA:  C3 through C7 cervical fusion. EXAM: DG C-ARM 61-120 MIN; CERVICAL SPINE - 2-3 VIEW COMPARISON:  09/28/2015 FINDINGS: A series of 3 intraoperative images of the cervical spine are obtained. The first at 4:18 p.m. demonstrates tissue spreaders in place posterior to C3-4, with facet screw and posterolateral rod hardware involving C3, C4, and extending caudad. The next image obtained at 4:19 is a lateral projection with obscured vertebral margins due to the presence of the shoulders. The tissue spreaders are observed as a landmark, and there are screw attachment terminal is a long the posterolateral rods at the expected locations of C5, C6, and C7 in addition to the above-described facet screws. The third image at 4:20 p.m. is a frontal projection demonstrating posterolateral rod and facet screw fixators at C3, C4, C5, C6, and C7. Intubation noted. IMPRESSION: 1. Intraoperative images depicting the posterolateral rod and facet screw fixation at C3-C4-C5-C6-C7. No complicating feature is observed on these fluoroscopic spot images. Electronically Signed   By: Van Clines M.D.   On: 10/22/2015 16:38    Antibiotics:  Anti-infectives    Start     Dose/Rate Route Frequency Ordered Stop   10/22/15 2200  ceFAZolin (ANCEF) IVPB 1 g/50 mL premix     1 g 100 mL/hr over 30 Minutes Intravenous Every 8 hours 10/22/15 1944 10/23/15 0607   10/22/15 1627  vancomycin (VANCOCIN) powder  Status:  Discontinued       As needed 10/22/15 1627 10/22/15 1708   10/22/15 1623  vancomycin (VANCOCIN) 1000 MG powder    Comments:  Carroll Kinds   : cabinet override      10/22/15 1623 10/23/15 0429   10/22/15 1448  bacitracin 50,000 Units in sodium chloride irrigation 0.9 % 500 mL irrigation   Status:  Discontinued       As needed 10/22/15 1449 10/22/15 1708   10/22/15 1300  ceFAZolin (ANCEF) 3 g in dextrose 5 % 50 mL IVPB     3 g 130 mL/hr over 30 Minutes Intravenous To [facility] 10/21/15 0953 10/22/15 1350      Discharge Exam: Blood pressure 103/61, pulse 88, temperature 97.8 F (36.6 C), temperature source Oral, resp. rate 16, height _0  (1.676 m), weight 118.389 kg (261 lb), last menstrual period 02/13/2011, SpO2 95 %. Neurologic: Grossly normal Incision ok  Discharge Medications:     Medication List    STOP taking these medications        HYDROcodone-acetaminophen 5-325 MG tablet  Commonly known as:  NORCO/VICODIN      TAKE these medications  amitriptyline 25 MG tablet  Commonly known as:  ELAVIL  TAKE 1 TABLET BY MOUTH AT BEDTIME     aspirin 81 MG EC tablet  Take 1 tablet (81 mg total) by mouth daily.     carvedilol 6.25 MG tablet  Commonly known as:  COREG  Take 1 tablet (6.25 mg total) by mouth 2 (two) times daily.     cyclobenzaprine 10 MG tablet  Commonly known as:  FLEXERIL  Take 1 tablet (10 mg total) by mouth 3 (three) times daily as needed for muscle spasms.     diclofenac sodium 1 % Gel  Commonly known as:  VOLTAREN  Apply 2 g topically 4 (four) times daily.     dorzolamide-timolol 22.3-6.8 MG/ML ophthalmic solution  Commonly known as:  COSOPT  Place 1 drop into both eyes 2 (two) times daily.     ferrous sulfate 325 (65 FE) MG tablet  Take 1 tablet (325 mg total) by mouth daily with breakfast.     fluorometholone 0.1 % ophthalmic suspension  Commonly known as:  FML  Place 1 drop into both eyes 2 (two) times daily.     furosemide 40 MG tablet  Commonly known as:  LASIX  Take 0.5 tablets (20 mg total) by mouth daily.     gabapentin 300 MG capsule  Commonly known as:  NEURONTIN  TAKE 1 CAPSULE THREE TIMES DAILY     glucose blood test strip  Commonly known as:  ONETOUCH VERIO  Check blood sugar as instructed up  to 3 times a day     insulin aspart 100 UNIT/ML FlexPen  Commonly known as:  NOVOLOG FLEXPEN  Inject 10 Units into the skin 3 (three) times daily with meals.     Insulin Glargine 100 UNIT/ML Solostar Pen  Commonly known as:  LANTUS SOLOSTAR  Inject 44 Units into the skin at bedtime.     Insulin Pen Needle 31G X 5 MM Misc  Commonly known as:  B-D UF III MINI PEN NEEDLES  Use to inject insulin 4-5 times daily     latanoprost 0.005 % ophthalmic solution  Commonly known as:  XALATAN  Place 1 drop into both eyes at bedtime.     lisinopril 20 MG tablet  Commonly known as:  PRINIVIL,ZESTRIL  Take 0.5 tablets (10 mg total) by mouth daily.     metFORMIN 1000 MG tablet  Commonly known as:  GLUCOPHAGE  Take 1 tablet (1,000 mg total) by mouth 2 (two) times daily with a meal.     ondansetron 4 MG tablet  Commonly known as:  ZOFRAN  Take 1 tablet (4 mg total) by mouth every 8 (eight) hours as needed for nausea or vomiting.     ONETOUCH DELICA LANCETS FINE Misc  Check blood sugar as instructed up to 3 times a day     oxyCODONE-acetaminophen 5-325 MG tablet  Commonly known as:  PERCOCET/ROXICET  Take 1-2 tablets by mouth every 6 (six) hours as needed for moderate pain.     pantoprazole 40 MG tablet  Commonly known as:  PROTONIX  Take 1 tablet (40 mg total) by mouth daily.     pravastatin 20 MG tablet  Commonly known as:  PRAVACHOL  Take 1 tablet (20 mg total) by mouth daily.     triamcinolone cream 0.1 %  Commonly known as:  KENALOG  Apply 1 application topically 2 (two) times daily.        Disposition: home   Final Dx: CL/  PCF C3-7      Discharge Instructions    AMB Referral to [facility] Management    Complete by:  As directed   Please assign to [place] for DM management and education. Consent signed. Please call with questions. Thanks. Marthenia Rolling, [place], RN,BSN-[facility] [phone]  Reason for consult:  Please assign to [facility] RNCM  Diagnoses  of:  Diabetes  Expected date of contact:  1-3 days (reserved for hospital discharges)     Call MD for:  difficulty breathing, headache or visual disturbances    Complete by:  As directed      Call MD for:  persistant nausea and vomiting    Complete by:  As directed      Call MD for:  redness, tenderness, or signs of infection (pain, swelling, redness, odor or green/yellow discharge around incision site)    Complete by:  As directed      Call MD for:  severe uncontrolled pain    Complete by:  As directed      Call MD for:  temperature >100.4    Complete by:  As directed      Diet - low sodium heart healthy    Complete by:  As directed      Discharge instructions    Complete by:  As directed   No strenuous activity, no heavy lifting, may shower, no driving     Increase activity slowly    Complete by:  As directed               Signed: Uri Covey S 10/27/2015, 8:47 AM

## 2015-10-27 NOTE — Care Management Note (Signed)
Case Management Note  Patient Details  Name: [name] MRN: [id number] Date of Birth: 08-01-1960  Subjective/Objective:                    Action/Plan: Patient discharging to home with self care. Pt with orders for walker and 3 in 1. Pt states she has this equipment at home. No further needs per CM.   Expected Discharge Date:                  Expected Discharge Plan:  Home/Self Care  In-House Referral:     Discharge planning Services  CM Consult  Post Acute Care Choice:    Choice offered to:     DME Arranged:    DME Agency:     HH Arranged:    [place] Agency:     Status of Service:  Completed, signed off  Medicare Important Message Given:  Yes Date Medicare IM Given:    Medicare IM give by:    Date Additional Medicare IM Given:    Additional Medicare Important Message give by:     If discussed at [place] of Stay Meetings, dates discussed:    Additional Comments:  Pollie Friar, RN 10/27/2015, 11:16 AM

## 2015-10-27 NOTE — Progress Notes (Signed)
OT Cancellation Note  Patient Details Name: [name] MRN: [id number] DOB: 08-Nov-1960   Cancelled Treatment:    Reason Eval/Treat Not Completed: Other (comment) OT with pt for about 4 minutes and reviewed some information, and volunteer came to take pt to leave and d/c.  Benito Mccreedy OTR/L [id number] 10/27/2015, 12:16 PM

## 2015-10-28 DIAGNOSIS — G609 Hereditary and idiopathic neuropathy, unspecified: Secondary | ICD-10-CM | POA: Diagnosis not present

## 2015-10-29 ENCOUNTER — Other Ambulatory Visit: Payer: Self-pay

## 2015-10-29 DIAGNOSIS — G609 Hereditary and idiopathic neuropathy, unspecified: Secondary | ICD-10-CM | POA: Diagnosis not present

## 2015-10-29 NOTE — Patient Outreach (Signed)
This RNCM made successful attempt to contact patient for this initial telephone assessment for community care coordination. Patient identified herself by providing date of birth and address. Patient was agreeable to this assessment.  Patient stated she would like to learn more about diabetic control. Patient recently had surgery, has pain. Patient states she can perform her ADLS slowly at a pain level of 4-5 out of 10.  Patient and this RNCM agreed to home visit next week for diabetic education, assessment of need for community care coordination.    Patient and this RNCM collaborated to form her initial case management care plan.

## 2015-10-30 ENCOUNTER — Ambulatory Visit: Payer: Self-pay

## 2015-10-30 DIAGNOSIS — G609 Hereditary and idiopathic neuropathy, unspecified: Secondary | ICD-10-CM | POA: Diagnosis not present

## 2015-10-31 DIAGNOSIS — G609 Hereditary and idiopathic neuropathy, unspecified: Secondary | ICD-10-CM | POA: Diagnosis not present

## 2015-11-01 DIAGNOSIS — G609 Hereditary and idiopathic neuropathy, unspecified: Secondary | ICD-10-CM | POA: Diagnosis not present

## 2015-11-02 DIAGNOSIS — G609 Hereditary and idiopathic neuropathy, unspecified: Secondary | ICD-10-CM | POA: Diagnosis not present

## 2015-11-03 DIAGNOSIS — G609 Hereditary and idiopathic neuropathy, unspecified: Secondary | ICD-10-CM | POA: Diagnosis not present

## 2015-11-04 DIAGNOSIS — G609 Hereditary and idiopathic neuropathy, unspecified: Secondary | ICD-10-CM | POA: Diagnosis not present

## 2015-11-05 ENCOUNTER — Other Ambulatory Visit: Payer: Self-pay

## 2015-11-05 DIAGNOSIS — E1159 Type 2 diabetes mellitus with other circulatory complications: Secondary | ICD-10-CM

## 2015-11-05 DIAGNOSIS — G609 Hereditary and idiopathic neuropathy, unspecified: Secondary | ICD-10-CM | POA: Diagnosis not present

## 2015-11-06 DIAGNOSIS — G609 Hereditary and idiopathic neuropathy, unspecified: Secondary | ICD-10-CM | POA: Diagnosis not present

## 2015-11-06 NOTE — Patient Outreach (Signed)
[facility]) Care Management  Nov 05, 2015  [name] 29-Sep-1960 [id number]    initial home visit for community care coordination and diabetes education. Patient identified herself by providing date of birth.  Patient lives in home with daughter and small children. Patient recently had back surgery, states she is still having pain from the incision site. Patein states she gets assistance with ADLs, IADLs from her daughter, [name], with whom she shares a home. 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   Findings: Patient is a poorly controlled diabetic. Patient recently had back surgery, states she is in pain, has pain medications prescribed. Patient reports compliance with pain medication regimen.  There are several steps to get to her front door, and, several steps to get to the washer and dryer which is in the basement. Patient reports leading a sedentary lifestyle, encouraged to take short walks in the house to burn off some of the carbohydrates consumed.  Patient is receiving in home physical and occupational therapy.    This RNCM viewed videos on Diabetes, Counting Carbohydrates and hgA1c. Patient states she has met with [name], Dietician for Diabetic Education.   [name] works in the [employer].  Patient reports noncompliance with low carbohydrate diet.    Patient and RNCM reviewed her Case Management Care Plan and updated.    Plan: Telephone contact next week for assessment of community care coordination needs.

## 2015-11-07 DIAGNOSIS — G609 Hereditary and idiopathic neuropathy, unspecified: Secondary | ICD-10-CM | POA: Diagnosis not present

## 2015-11-08 DIAGNOSIS — G609 Hereditary and idiopathic neuropathy, unspecified: Secondary | ICD-10-CM | POA: Diagnosis not present

## 2015-11-09 ENCOUNTER — Other Ambulatory Visit: Payer: Self-pay | Admitting: Licensed Clinical Social Worker

## 2015-11-09 DIAGNOSIS — G609 Hereditary and idiopathic neuropathy, unspecified: Secondary | ICD-10-CM | POA: Diagnosis not present

## 2015-11-09 NOTE — Patient Outreach (Signed)
[facility]) Care Management  11/09/2015  [name] 1960-11-26 [id number]   Assessment- CSW received referral from [facility] stating that patient was in need of housing resources. CSW completed outreach to patient and patient answered. CSW introduced self, reason for call and of [facility] services. Patient reports that she is considering relocating because of her rent rate which is $600. Patient receives Medicaid but does not receive Section 8. CSW informed her that Section 8 is no longer accepting patients at this time. CSW reviewed with her each housing resource and a web link that can help her find available houses and apartment within her price range. Patient reports that her daughter has a computer than she can use. Patient is agreeable to CSW mailing out housing resources to residence. CSW confirmed address. Patient denies any further social work needs and is agreeable to discharge.  Plan-CSW will update RNCM. CSW will send in basket message to [place] Management Assistant to request that housing resources be mailed to patient's home.   Eula Fried, BSW, MSW, [email or web address] Phone: [phone] Fax: [phone]

## 2015-11-10 DIAGNOSIS — G609 Hereditary and idiopathic neuropathy, unspecified: Secondary | ICD-10-CM | POA: Diagnosis not present

## 2015-11-11 DIAGNOSIS — G609 Hereditary and idiopathic neuropathy, unspecified: Secondary | ICD-10-CM | POA: Diagnosis not present

## 2015-11-12 DIAGNOSIS — G609 Hereditary and idiopathic neuropathy, unspecified: Secondary | ICD-10-CM | POA: Diagnosis not present

## 2015-11-13 DIAGNOSIS — G609 Hereditary and idiopathic neuropathy, unspecified: Secondary | ICD-10-CM | POA: Diagnosis not present

## 2015-11-13 NOTE — Patient Outreach (Signed)
[facility]) Care Management  11/13/2015  [name] 1961-04-14 [id number]   Request received from Eula Fried, LCSW to mail patient housing resources. Information mailed today, 11/13/15.

## 2015-11-14 DIAGNOSIS — G609 Hereditary and idiopathic neuropathy, unspecified: Secondary | ICD-10-CM | POA: Diagnosis not present

## 2015-11-15 ENCOUNTER — Other Ambulatory Visit: Payer: Self-pay

## 2015-11-15 DIAGNOSIS — G609 Hereditary and idiopathic neuropathy, unspecified: Secondary | ICD-10-CM | POA: Diagnosis not present

## 2015-11-16 DIAGNOSIS — G609 Hereditary and idiopathic neuropathy, unspecified: Secondary | ICD-10-CM | POA: Diagnosis not present

## 2015-11-16 DIAGNOSIS — M4802 Spinal stenosis, cervical region: Secondary | ICD-10-CM | POA: Diagnosis not present

## 2015-11-17 DIAGNOSIS — G609 Hereditary and idiopathic neuropathy, unspecified: Secondary | ICD-10-CM | POA: Diagnosis not present

## 2015-11-18 DIAGNOSIS — G609 Hereditary and idiopathic neuropathy, unspecified: Secondary | ICD-10-CM | POA: Diagnosis not present

## 2015-11-19 DIAGNOSIS — G609 Hereditary and idiopathic neuropathy, unspecified: Secondary | ICD-10-CM | POA: Diagnosis not present

## 2015-11-20 DIAGNOSIS — G609 Hereditary and idiopathic neuropathy, unspecified: Secondary | ICD-10-CM | POA: Diagnosis not present

## 2015-11-21 DIAGNOSIS — G609 Hereditary and idiopathic neuropathy, unspecified: Secondary | ICD-10-CM | POA: Diagnosis not present

## 2015-11-22 DIAGNOSIS — G609 Hereditary and idiopathic neuropathy, unspecified: Secondary | ICD-10-CM | POA: Diagnosis not present

## 2015-11-23 DIAGNOSIS — G609 Hereditary and idiopathic neuropathy, unspecified: Secondary | ICD-10-CM | POA: Diagnosis not present

## 2015-11-24 DIAGNOSIS — G609 Hereditary and idiopathic neuropathy, unspecified: Secondary | ICD-10-CM | POA: Diagnosis not present

## 2015-11-25 DIAGNOSIS — G609 Hereditary and idiopathic neuropathy, unspecified: Secondary | ICD-10-CM | POA: Diagnosis not present

## 2015-11-26 DIAGNOSIS — G609 Hereditary and idiopathic neuropathy, unspecified: Secondary | ICD-10-CM | POA: Diagnosis not present

## 2015-11-27 DIAGNOSIS — G609 Hereditary and idiopathic neuropathy, unspecified: Secondary | ICD-10-CM | POA: Diagnosis not present

## 2015-11-28 DIAGNOSIS — G609 Hereditary and idiopathic neuropathy, unspecified: Secondary | ICD-10-CM | POA: Diagnosis not present

## 2015-11-30 ENCOUNTER — Other Ambulatory Visit: Payer: Self-pay

## 2015-12-01 NOTE — Patient Outreach (Signed)
Unsuccessful attempt made to contact patient via telephone for community care coordination. HIPPA compliant message left for patient with this RNCM's contact information.

## 2015-12-04 ENCOUNTER — Other Ambulatory Visit: Payer: Self-pay

## 2015-12-04 NOTE — Patient Outreach (Signed)
Call made to assess chronic disease management. Patient identified herself by provident date of birth and address. Patient states she is moving around better since her back surgery and working with physical therapy. Patient also states her pain is better controlled. patient also states she is adhering to medication regimen.  Patient states her blood glucose levels are better controlled, however, she has expressed a need for additional education. Patient reminded about [name], Dietician for [facility] who is dedicated to that clinic.   Patient encouraged to get in contact with [name] to set up an appointment for further diabetes education.  Patient states she can afford her medications and has a sound knowledge base related to her medications.

## 2015-12-06 DIAGNOSIS — G609 Hereditary and idiopathic neuropathy, unspecified: Secondary | ICD-10-CM | POA: Diagnosis not present

## 2015-12-07 ENCOUNTER — Other Ambulatory Visit: Payer: Self-pay

## 2015-12-07 DIAGNOSIS — G609 Hereditary and idiopathic neuropathy, unspecified: Secondary | ICD-10-CM | POA: Diagnosis not present

## 2015-12-07 NOTE — Telephone Encounter (Signed)
Pt requesting oxycodone to be filled. °

## 2015-12-07 NOTE — Telephone Encounter (Signed)
Last refill 5/2 OV 3/15 UDS 12/2014 No future appointments

## 2015-12-08 DIAGNOSIS — G609 Hereditary and idiopathic neuropathy, unspecified: Secondary | ICD-10-CM | POA: Diagnosis not present

## 2015-12-09 ENCOUNTER — Ambulatory Visit: Payer: Commercial Managed Care - HMO | Admitting: Internal Medicine

## 2015-12-09 ENCOUNTER — Telehealth: Payer: Self-pay

## 2015-12-09 DIAGNOSIS — G609 Hereditary and idiopathic neuropathy, unspecified: Secondary | ICD-10-CM | POA: Diagnosis not present

## 2015-12-09 MED ORDER — OXYCODONE-ACETAMINOPHEN 5-325 MG PO TABS: 1.0000 | ORAL_TABLET | Freq: Four times a day (QID) | ORAL | Status: DC | PRN

## 2015-12-09 NOTE — Telephone Encounter (Signed)
Pt want to know if pain med is ready.

## 2015-12-09 NOTE — Telephone Encounter (Signed)
Pt informed it is ready via VM

## 2015-12-09 NOTE — Telephone Encounter (Signed)
Pt informed

## 2015-12-09 NOTE — Telephone Encounter (Signed)
Her pain medication was changed after her recent neck surgery being discharged with oxycodone 5 x120 when her previous chronic pain regimen was hydrocodone 5 x90 which is reflected in her pain contract. Without her presenting to clinic for a new evaluation I am not going to increase the chronically prescribed pill count. She really needs to be seen again before we keep refilling this so we can reassess her symptoms.  I will refill x90 tablets today to cover the interval since she should be out of her medication at this time.  Thanks.

## 2015-12-10 ENCOUNTER — Ambulatory Visit: Payer: Commercial Managed Care - HMO | Admitting: Internal Medicine

## 2015-12-10 ENCOUNTER — Encounter: Payer: Self-pay | Admitting: Internal Medicine

## 2015-12-10 DIAGNOSIS — G609 Hereditary and idiopathic neuropathy, unspecified: Secondary | ICD-10-CM | POA: Diagnosis not present

## 2015-12-11 DIAGNOSIS — G609 Hereditary and idiopathic neuropathy, unspecified: Secondary | ICD-10-CM | POA: Diagnosis not present

## 2015-12-12 DIAGNOSIS — G609 Hereditary and idiopathic neuropathy, unspecified: Secondary | ICD-10-CM | POA: Diagnosis not present

## 2015-12-13 DIAGNOSIS — G609 Hereditary and idiopathic neuropathy, unspecified: Secondary | ICD-10-CM | POA: Diagnosis not present

## 2015-12-14 DIAGNOSIS — G609 Hereditary and idiopathic neuropathy, unspecified: Secondary | ICD-10-CM | POA: Diagnosis not present

## 2015-12-15 ENCOUNTER — Other Ambulatory Visit: Payer: Self-pay | Admitting: Internal Medicine

## 2015-12-15 DIAGNOSIS — G609 Hereditary and idiopathic neuropathy, unspecified: Secondary | ICD-10-CM | POA: Diagnosis not present

## 2015-12-16 DIAGNOSIS — G609 Hereditary and idiopathic neuropathy, unspecified: Secondary | ICD-10-CM | POA: Diagnosis not present

## 2015-12-17 DIAGNOSIS — G609 Hereditary and idiopathic neuropathy, unspecified: Secondary | ICD-10-CM | POA: Diagnosis not present

## 2015-12-17 NOTE — Telephone Encounter (Signed)
Patient needs new [name] appointment after recent cancellation/no show. Last seen in March and has had surgery since then for her chronic pain. Thanks.

## 2015-12-18 ENCOUNTER — Other Ambulatory Visit: Payer: Self-pay | Admitting: Internal Medicine

## 2015-12-18 DIAGNOSIS — G609 Hereditary and idiopathic neuropathy, unspecified: Secondary | ICD-10-CM | POA: Diagnosis not present

## 2015-12-19 DIAGNOSIS — G609 Hereditary and idiopathic neuropathy, unspecified: Secondary | ICD-10-CM | POA: Diagnosis not present

## 2015-12-20 DIAGNOSIS — G609 Hereditary and idiopathic neuropathy, unspecified: Secondary | ICD-10-CM | POA: Diagnosis not present

## 2015-12-21 DIAGNOSIS — G609 Hereditary and idiopathic neuropathy, unspecified: Secondary | ICD-10-CM | POA: Diagnosis not present

## 2015-12-21 NOTE — Telephone Encounter (Signed)
Please schedule patient for a follow-up visit after surgery. Thanks!

## 2015-12-22 DIAGNOSIS — G609 Hereditary and idiopathic neuropathy, unspecified: Secondary | ICD-10-CM | POA: Diagnosis not present

## 2015-12-23 ENCOUNTER — Other Ambulatory Visit: Payer: Self-pay | Admitting: Internal Medicine

## 2015-12-23 DIAGNOSIS — G609 Hereditary and idiopathic neuropathy, unspecified: Secondary | ICD-10-CM | POA: Diagnosis not present

## 2015-12-24 DIAGNOSIS — G609 Hereditary and idiopathic neuropathy, unspecified: Secondary | ICD-10-CM | POA: Diagnosis not present

## 2015-12-25 ENCOUNTER — Other Ambulatory Visit: Payer: Self-pay | Admitting: Internal Medicine

## 2015-12-25 DIAGNOSIS — G609 Hereditary and idiopathic neuropathy, unspecified: Secondary | ICD-10-CM | POA: Diagnosis not present

## 2015-12-26 DIAGNOSIS — G609 Hereditary and idiopathic neuropathy, unspecified: Secondary | ICD-10-CM | POA: Diagnosis not present

## 2015-12-27 DIAGNOSIS — G609 Hereditary and idiopathic neuropathy, unspecified: Secondary | ICD-10-CM | POA: Diagnosis not present

## 2015-12-28 ENCOUNTER — Ambulatory Visit: Payer: Commercial Managed Care - HMO

## 2015-12-28 ENCOUNTER — Encounter: Payer: Self-pay | Admitting: Internal Medicine

## 2015-12-28 DIAGNOSIS — G609 Hereditary and idiopathic neuropathy, unspecified: Secondary | ICD-10-CM | POA: Diagnosis not present

## 2015-12-29 DIAGNOSIS — G609 Hereditary and idiopathic neuropathy, unspecified: Secondary | ICD-10-CM | POA: Diagnosis not present

## 2015-12-30 DIAGNOSIS — G609 Hereditary and idiopathic neuropathy, unspecified: Secondary | ICD-10-CM | POA: Diagnosis not present

## 2015-12-31 DIAGNOSIS — G609 Hereditary and idiopathic neuropathy, unspecified: Secondary | ICD-10-CM | POA: Diagnosis not present

## 2016-01-01 DIAGNOSIS — G609 Hereditary and idiopathic neuropathy, unspecified: Secondary | ICD-10-CM | POA: Diagnosis not present

## 2016-01-02 ENCOUNTER — Other Ambulatory Visit: Payer: Self-pay | Admitting: Internal Medicine

## 2016-01-02 DIAGNOSIS — G609 Hereditary and idiopathic neuropathy, unspecified: Secondary | ICD-10-CM | POA: Diagnosis not present

## 2016-01-03 DIAGNOSIS — G609 Hereditary and idiopathic neuropathy, unspecified: Secondary | ICD-10-CM | POA: Diagnosis not present

## 2016-01-04 ENCOUNTER — Encounter: Payer: Self-pay | Admitting: Internal Medicine

## 2016-01-04 DIAGNOSIS — G609 Hereditary and idiopathic neuropathy, unspecified: Secondary | ICD-10-CM | POA: Diagnosis not present

## 2016-01-04 NOTE — Telephone Encounter (Signed)
Message forwarded to front office for an appt.[name], [name]7/10/201710:18 AM

## 2016-01-04 NOTE — Telephone Encounter (Signed)
This refill is appropriate, but she needs a new [facility] appointment, ideally within the next month or two. She was last seen in March. Thanks.

## 2016-01-05 DIAGNOSIS — G609 Hereditary and idiopathic neuropathy, unspecified: Secondary | ICD-10-CM | POA: Diagnosis not present

## 2016-01-06 DIAGNOSIS — G609 Hereditary and idiopathic neuropathy, unspecified: Secondary | ICD-10-CM | POA: Diagnosis not present

## 2016-01-07 DIAGNOSIS — G609 Hereditary and idiopathic neuropathy, unspecified: Secondary | ICD-10-CM | POA: Diagnosis not present

## 2016-01-08 DIAGNOSIS — G609 Hereditary and idiopathic neuropathy, unspecified: Secondary | ICD-10-CM | POA: Diagnosis not present

## 2016-01-09 DIAGNOSIS — G609 Hereditary and idiopathic neuropathy, unspecified: Secondary | ICD-10-CM | POA: Diagnosis not present

## 2016-01-10 DIAGNOSIS — G609 Hereditary and idiopathic neuropathy, unspecified: Secondary | ICD-10-CM | POA: Diagnosis not present

## 2016-01-11 DIAGNOSIS — G609 Hereditary and idiopathic neuropathy, unspecified: Secondary | ICD-10-CM | POA: Diagnosis not present

## 2016-01-12 DIAGNOSIS — G609 Hereditary and idiopathic neuropathy, unspecified: Secondary | ICD-10-CM | POA: Diagnosis not present

## 2016-01-13 DIAGNOSIS — G609 Hereditary and idiopathic neuropathy, unspecified: Secondary | ICD-10-CM | POA: Diagnosis not present

## 2016-01-14 DIAGNOSIS — G609 Hereditary and idiopathic neuropathy, unspecified: Secondary | ICD-10-CM | POA: Diagnosis not present

## 2016-01-15 DIAGNOSIS — G609 Hereditary and idiopathic neuropathy, unspecified: Secondary | ICD-10-CM | POA: Diagnosis not present

## 2016-01-16 DIAGNOSIS — G609 Hereditary and idiopathic neuropathy, unspecified: Secondary | ICD-10-CM | POA: Diagnosis not present

## 2016-01-17 DIAGNOSIS — G609 Hereditary and idiopathic neuropathy, unspecified: Secondary | ICD-10-CM | POA: Diagnosis not present

## 2016-01-18 DIAGNOSIS — M4802 Spinal stenosis, cervical region: Secondary | ICD-10-CM | POA: Diagnosis not present

## 2016-01-18 DIAGNOSIS — G609 Hereditary and idiopathic neuropathy, unspecified: Secondary | ICD-10-CM | POA: Diagnosis not present

## 2016-01-19 DIAGNOSIS — G609 Hereditary and idiopathic neuropathy, unspecified: Secondary | ICD-10-CM | POA: Diagnosis not present

## 2016-01-20 DIAGNOSIS — G609 Hereditary and idiopathic neuropathy, unspecified: Secondary | ICD-10-CM | POA: Diagnosis not present

## 2016-01-21 DIAGNOSIS — G609 Hereditary and idiopathic neuropathy, unspecified: Secondary | ICD-10-CM | POA: Diagnosis not present

## 2016-01-22 DIAGNOSIS — G609 Hereditary and idiopathic neuropathy, unspecified: Secondary | ICD-10-CM | POA: Diagnosis not present

## 2016-01-23 DIAGNOSIS — G609 Hereditary and idiopathic neuropathy, unspecified: Secondary | ICD-10-CM | POA: Diagnosis not present

## 2016-01-24 DIAGNOSIS — G609 Hereditary and idiopathic neuropathy, unspecified: Secondary | ICD-10-CM | POA: Diagnosis not present

## 2016-01-25 DIAGNOSIS — G609 Hereditary and idiopathic neuropathy, unspecified: Secondary | ICD-10-CM | POA: Diagnosis not present

## 2016-01-26 DIAGNOSIS — G609 Hereditary and idiopathic neuropathy, unspecified: Secondary | ICD-10-CM | POA: Diagnosis not present

## 2016-01-27 DIAGNOSIS — G609 Hereditary and idiopathic neuropathy, unspecified: Secondary | ICD-10-CM | POA: Diagnosis not present

## 2016-01-28 DIAGNOSIS — G609 Hereditary and idiopathic neuropathy, unspecified: Secondary | ICD-10-CM | POA: Diagnosis not present

## 2016-01-29 DIAGNOSIS — G609 Hereditary and idiopathic neuropathy, unspecified: Secondary | ICD-10-CM | POA: Diagnosis not present

## 2016-01-30 DIAGNOSIS — G609 Hereditary and idiopathic neuropathy, unspecified: Secondary | ICD-10-CM | POA: Diagnosis not present

## 2016-01-31 DIAGNOSIS — G609 Hereditary and idiopathic neuropathy, unspecified: Secondary | ICD-10-CM | POA: Diagnosis not present

## 2016-02-01 DIAGNOSIS — G609 Hereditary and idiopathic neuropathy, unspecified: Secondary | ICD-10-CM | POA: Diagnosis not present

## 2016-02-02 DIAGNOSIS — G609 Hereditary and idiopathic neuropathy, unspecified: Secondary | ICD-10-CM | POA: Diagnosis not present

## 2016-02-03 DIAGNOSIS — G609 Hereditary and idiopathic neuropathy, unspecified: Secondary | ICD-10-CM | POA: Diagnosis not present

## 2016-02-04 DIAGNOSIS — G609 Hereditary and idiopathic neuropathy, unspecified: Secondary | ICD-10-CM | POA: Diagnosis not present

## 2016-02-05 DIAGNOSIS — G609 Hereditary and idiopathic neuropathy, unspecified: Secondary | ICD-10-CM | POA: Diagnosis not present

## 2016-02-06 DIAGNOSIS — G609 Hereditary and idiopathic neuropathy, unspecified: Secondary | ICD-10-CM | POA: Diagnosis not present

## 2016-02-07 DIAGNOSIS — G609 Hereditary and idiopathic neuropathy, unspecified: Secondary | ICD-10-CM | POA: Diagnosis not present

## 2016-02-08 DIAGNOSIS — G609 Hereditary and idiopathic neuropathy, unspecified: Secondary | ICD-10-CM | POA: Diagnosis not present

## 2016-02-09 DIAGNOSIS — G609 Hereditary and idiopathic neuropathy, unspecified: Secondary | ICD-10-CM | POA: Diagnosis not present

## 2016-02-10 DIAGNOSIS — G609 Hereditary and idiopathic neuropathy, unspecified: Secondary | ICD-10-CM | POA: Diagnosis not present

## 2016-02-11 DIAGNOSIS — G609 Hereditary and idiopathic neuropathy, unspecified: Secondary | ICD-10-CM | POA: Diagnosis not present

## 2016-02-12 ENCOUNTER — Ambulatory Visit (INDEPENDENT_AMBULATORY_CARE_PROVIDER_SITE_OTHER): Payer: Commercial Managed Care - HMO | Admitting: Internal Medicine

## 2016-02-12 ENCOUNTER — Encounter: Payer: Self-pay | Admitting: Internal Medicine

## 2016-02-12 VITALS — BP 154/60 | HR 103 | Temp 97.5°F | Wt 258.7 lb

## 2016-02-12 DIAGNOSIS — E1165 Type 2 diabetes mellitus with hyperglycemia: Secondary | ICD-10-CM | POA: Diagnosis not present

## 2016-02-12 DIAGNOSIS — L299 Pruritus, unspecified: Secondary | ICD-10-CM | POA: Diagnosis not present

## 2016-02-12 DIAGNOSIS — Z794 Long term (current) use of insulin: Secondary | ICD-10-CM

## 2016-02-12 DIAGNOSIS — B86 Scabies: Secondary | ICD-10-CM

## 2016-02-12 DIAGNOSIS — M4802 Spinal stenosis, cervical region: Secondary | ICD-10-CM | POA: Diagnosis not present

## 2016-02-12 DIAGNOSIS — E1140 Type 2 diabetes mellitus with diabetic neuropathy, unspecified: Secondary | ICD-10-CM | POA: Diagnosis not present

## 2016-02-12 DIAGNOSIS — Z881 Allergy status to other antibiotic agents status: Secondary | ICD-10-CM

## 2016-02-12 DIAGNOSIS — IMO0002 Reserved for concepts with insufficient information to code with codable children: Secondary | ICD-10-CM

## 2016-02-12 DIAGNOSIS — G609 Hereditary and idiopathic neuropathy, unspecified: Secondary | ICD-10-CM | POA: Diagnosis not present

## 2016-02-12 LAB — POCT GLYCOSYLATED HEMOGLOBIN (HGB A1C): Hemoglobin A1C: 9.7

## 2016-02-12 LAB — GLUCOSE, CAPILLARY: Glucose-Capillary: 138 mg/dL — ABNORMAL HIGH (ref 65–99)

## 2016-02-12 MED ORDER — OXYCODONE-ACETAMINOPHEN 5-325 MG PO TABS: 1.0000 | ORAL_TABLET | Freq: Four times a day (QID) | ORAL | 0 refills | Status: DC | PRN

## 2016-02-12 MED ORDER — PERMETHRIN 1 % EX LOTN: TOPICAL_LOTION | CUTANEOUS | 1 refills | Status: DC

## 2016-02-12 NOTE — Patient Instructions (Signed)
It was a pleasure to see you today [name].  Today I refilled several of your medications including your pain medication for the next month. We can talk at your next visit if this dose is working well.  For your diabetes I recommend using a pill planner to help with taking your metformin twice a day as directed. Continue to use your insulin 44U at night Lantus plus your Novolog insulin 6-10 units before meals. Consider using the 10 units if your are seeing blood glucose levels greater than 200 prior to meals.  I sent a prescription for permethrin cream to your pharmacy. This needs to be applied from neck to feet and allowed to sit for at least 15 minutes before showering off. You should also wash all your sheets and clothes in warm water to help eliminate the mites living in these surfaces.  We will need to follow up again within the next month.

## 2016-02-13 DIAGNOSIS — G609 Hereditary and idiopathic neuropathy, unspecified: Secondary | ICD-10-CM | POA: Diagnosis not present

## 2016-02-13 LAB — BMP8+ANION GAP
Anion Gap: 19 mmol/L — ABNORMAL HIGH (ref 10.0–18.0)
BUN/Creatinine Ratio: 20 (ref 9–23)
BUN: 21 mg/dL (ref 6–24)
CHLORIDE: 101 mmol/L (ref 96–106)
CO2: 23 mmol/L (ref 18–29)
Calcium: 9.7 mg/dL (ref 8.7–10.2)
Creatinine, Ser: 1.07 mg/dL — ABNORMAL HIGH (ref 0.57–1.00)
GFR calc Af Amer: 67 mL/min/{1.73_m2} (ref >59)
GFR calc non Af Amer: 58 mL/min/{1.73_m2} — ABNORMAL LOW (ref >59)
GLUCOSE: 144 mg/dL — AB (ref 65–99)
Potassium: 4.7 mmol/L (ref 3.5–5.2)
Sodium: 143 mmol/L (ref 134–144)

## 2016-02-14 DIAGNOSIS — G609 Hereditary and idiopathic neuropathy, unspecified: Secondary | ICD-10-CM | POA: Diagnosis not present

## 2016-02-15 DIAGNOSIS — G609 Hereditary and idiopathic neuropathy, unspecified: Secondary | ICD-10-CM | POA: Diagnosis not present

## 2016-02-15 NOTE — Assessment & Plan Note (Addendum)
Lab Results  Component Value Date   HGBA1C 9.7 02/12/2016   HGBA1C 9.7 (H) 10/15/2015   HGBA1C 8.3 08/07/2015     Assessment: Diabetes control: Uncontrolled Progress toward A1C goal:  Unchanged Comments: She reports good compliance on her insulin but is regularly missing metformin doses. Also she had been recommended to try increasing from 6 to 10 units of bolus novolog insulin before meals but never changed. She has concerns about hypoglycemia with any increases to her medication.  Metabolic panel checked, very slight anion gap noted but without acidosis.  Plan: Medications:  Continue Lantus 44, metformin 1000mg  bid, novolog 6-10 U TIDAC I instructed her to try using 10 units of insulin any time her preprandial BS is 200 or above I provided her with a pill planner to hopefully improve metformin compliance Home glucose monitoring: Frequency: Three times daily Timing: before meals Instruction/counseling given: reminded to get eye exam, reminded to bring blood glucose meter & log to each visit and reminded to bring medications to each visit  Self management tools provided: Pill planner Other plans: RTC in a month

## 2016-02-15 NOTE — Assessment & Plan Note (Signed)
Pruritic rash on both forearms that is consistent with scabies in distribution. The hyperpigmented appearance is somewhat atypical.  P: Permethrin cream prescribed Instructed on washing clothes, sheets, use of permethrin

## 2016-02-15 NOTE — Assessment & Plan Note (Addendum)
Now s/p cervical spinal fusion. She has had some improvement in her radicular symptoms but continues to have neck/upper back pain likely related to a somewhat reduced ROM. She continues to have pain here and chronic lower back pain for which she has been taking chronic opiates. It appears her Norco was changed to percocet when pain medication provided for postsurgical changes.  Pain contracted 2016, last UDS 12/2014.   Refilled oxycodone-APAP 5-325 #90 tablets x1 month today She will need a repeat visit to assess current analgesic requirements, if these have now changed since surgery, and repeat UDS

## 2016-02-15 NOTE — Progress Notes (Signed)
   CC: Follow up for diabetes  HPI:  [name] is a 55 y.o. woman with medical history detailed below presenting to the clinic for a routine follow up of her diabetes. She has had higher blood sugar readings than usual at home which she attributes to forgetting to take her metformin. She has significant trouble with her daily or twice daily medicines and often cannot remember whether it was already taken or not. She states she is taking her insulin as directed. She had one episode of CBG greater than 500 and she felt headache and blurry vision. This improved after one extra dose of 6 units novolog alongside her nightly lantus and not eating for a while.  She also has a complaint with itching mostly on her hands and forearms. She thinks this is due to dust mites and notices flat pigmented spots on her forearms bilaterally. This started a few weeks ago and has slowly increased.  Past Medical History:  Diagnosis Date  . Adhesive capsulitis of left shoulder    Steroid injection Dr. Truman Hayward 1/12  . Adhesive capsulitis of right shoulder    Steroid injection Dr. Truman Hayward 1/12  . Adhesive capsulitis of shoulder    bilateral, Steroid injection Dr. Truman Hayward 1/12 bilaterally  . CAD (coronary artery disease)    nonobstructive. Last cardiac cath (2008) showing left circumflex with mid 50% stenosis and distal luminal irregularities. Also with RCA with mid to distal 30-40% stenosis. // Previously evaluated by [facility], never followed up outpatient.  Marland Kitchen CAP (community acquired pneumonia) 06/15/2012   05/2012 CXR: Mild opacification of the posterior lung base on the lateral film  as cannot exclude infection/atelectasis.    . CHF (congestive heart failure) ([place])   . Diabetes mellitus    Type II, insulin dependent  . Diabetic retinopathy   . GERD (gastroesophageal reflux disease)   . Glaucoma   . Hyperlipidemia   . Hypertension   . Obesity   . Peripheral neuropathy ([place])     Review of Systems:    Review of Systems  Constitutional: Negative for fever.  Eyes: Positive for blurred vision.  Respiratory: Negative for shortness of breath.   Cardiovascular: Negative for chest pain.  Gastrointestinal: Negative for diarrhea.  Genitourinary: Positive for frequency.  Musculoskeletal: Positive for joint pain.  Skin: Positive for itching and rash.  Neurological: Positive for headaches. Negative for seizures.     Physical Exam:  Vitals:   02/12/16 1528  BP: (!) 154/60  Pulse: (!) 103  Temp: 97.5 F (36.4 C)  TempSrc: Oral  SpO2: 100%  Weight: 258 lb 11.2 oz (117.3 kg)   GENERAL- alert, co-operative, NAD HEENT- Mildly reduced rotation ROM of neck bilaterally, neck surgical scar appears well healed, mild tenderness of trapezius muscles b/l CARDIAC- RRR, no murmurs, rubs or gallops. RESP- CTAB, no wheezes or crackles. NEURO- strength upper and lower extremities- 5/5, Sensation intact globally EXTREMITIES- 2-71mm flat pruritic hypopigmented spots on both sides of of forearms and and on interdigital webs, no pedal edema. SKIN- As above PSYCH- Normal mood and affect, appropriate thought content and speech.    Assessment & Plan:   See Encounters Tab for problem based charting.  Patient discussed with Dr. Evette Doffing

## 2016-02-16 DIAGNOSIS — G609 Hereditary and idiopathic neuropathy, unspecified: Secondary | ICD-10-CM | POA: Diagnosis not present

## 2016-02-16 NOTE — Progress Notes (Signed)
Internal Medicine Clinic Attending  Case discussed with Dr. Rice soon after the resident saw the patient.  We reviewed the resident's history and exam and pertinent patient test results.  I agree with the assessment, diagnosis, and plan of care documented in the resident's note. 

## 2016-02-17 DIAGNOSIS — G609 Hereditary and idiopathic neuropathy, unspecified: Secondary | ICD-10-CM | POA: Diagnosis not present

## 2016-02-18 DIAGNOSIS — G609 Hereditary and idiopathic neuropathy, unspecified: Secondary | ICD-10-CM | POA: Diagnosis not present

## 2016-02-19 DIAGNOSIS — H401133 Primary open-angle glaucoma, bilateral, severe stage: Secondary | ICD-10-CM | POA: Diagnosis not present

## 2016-02-19 DIAGNOSIS — Z961 Presence of intraocular lens: Secondary | ICD-10-CM | POA: Diagnosis not present

## 2016-02-19 DIAGNOSIS — G609 Hereditary and idiopathic neuropathy, unspecified: Secondary | ICD-10-CM | POA: Diagnosis not present

## 2016-02-19 LAB — HM DIABETES EYE EXAM

## 2016-02-20 DIAGNOSIS — G609 Hereditary and idiopathic neuropathy, unspecified: Secondary | ICD-10-CM | POA: Diagnosis not present

## 2016-02-23 ENCOUNTER — Other Ambulatory Visit: Payer: Self-pay | Admitting: Internal Medicine

## 2016-02-25 ENCOUNTER — Telehealth: Payer: Self-pay

## 2016-02-25 NOTE — Telephone Encounter (Signed)
Requesting permethrin to be filled.

## 2016-02-25 NOTE — Telephone Encounter (Signed)
Call made to pt's pharmacy, the permethrin 1% lotion is not the rx pt was requesting.  The pharmacy stated pt is requesting permethrin (ELIMITE) 5 % cream that we have filled in the past.  Will forward info to pcp for review and medication change if appropriate.  Please advise.Despina Hidden Cassady8/31/20171:30 PM

## 2016-02-28 DIAGNOSIS — G609 Hereditary and idiopathic neuropathy, unspecified: Secondary | ICD-10-CM | POA: Diagnosis not present

## 2016-02-29 DIAGNOSIS — G609 Hereditary and idiopathic neuropathy, unspecified: Secondary | ICD-10-CM | POA: Diagnosis not present

## 2016-03-01 DIAGNOSIS — G609 Hereditary and idiopathic neuropathy, unspecified: Secondary | ICD-10-CM | POA: Diagnosis not present

## 2016-03-02 DIAGNOSIS — G609 Hereditary and idiopathic neuropathy, unspecified: Secondary | ICD-10-CM | POA: Diagnosis not present

## 2016-03-03 DIAGNOSIS — G609 Hereditary and idiopathic neuropathy, unspecified: Secondary | ICD-10-CM | POA: Diagnosis not present

## 2016-03-04 DIAGNOSIS — G609 Hereditary and idiopathic neuropathy, unspecified: Secondary | ICD-10-CM | POA: Diagnosis not present

## 2016-03-05 DIAGNOSIS — G609 Hereditary and idiopathic neuropathy, unspecified: Secondary | ICD-10-CM | POA: Diagnosis not present

## 2016-03-06 DIAGNOSIS — G609 Hereditary and idiopathic neuropathy, unspecified: Secondary | ICD-10-CM | POA: Diagnosis not present

## 2016-03-07 DIAGNOSIS — G609 Hereditary and idiopathic neuropathy, unspecified: Secondary | ICD-10-CM | POA: Diagnosis not present

## 2016-03-08 DIAGNOSIS — G609 Hereditary and idiopathic neuropathy, unspecified: Secondary | ICD-10-CM | POA: Diagnosis not present

## 2016-03-09 DIAGNOSIS — G609 Hereditary and idiopathic neuropathy, unspecified: Secondary | ICD-10-CM | POA: Diagnosis not present

## 2016-03-10 DIAGNOSIS — G609 Hereditary and idiopathic neuropathy, unspecified: Secondary | ICD-10-CM | POA: Diagnosis not present

## 2016-03-11 ENCOUNTER — Ambulatory Visit (INDEPENDENT_AMBULATORY_CARE_PROVIDER_SITE_OTHER): Payer: Commercial Managed Care - HMO | Admitting: Internal Medicine

## 2016-03-11 VITALS — BP 120/80 | HR 100 | Temp 98.1°F | Ht 66.0 in | Wt 264.2 lb

## 2016-03-11 DIAGNOSIS — R21 Rash and other nonspecific skin eruption: Secondary | ICD-10-CM | POA: Diagnosis not present

## 2016-03-11 DIAGNOSIS — I10 Essential (primary) hypertension: Secondary | ICD-10-CM | POA: Diagnosis not present

## 2016-03-11 DIAGNOSIS — IMO0002 Reserved for concepts with insufficient information to code with codable children: Secondary | ICD-10-CM

## 2016-03-11 DIAGNOSIS — G8929 Other chronic pain: Secondary | ICD-10-CM | POA: Diagnosis not present

## 2016-03-11 DIAGNOSIS — Z23 Encounter for immunization: Secondary | ICD-10-CM | POA: Diagnosis not present

## 2016-03-11 DIAGNOSIS — Z794 Long term (current) use of insulin: Secondary | ICD-10-CM

## 2016-03-11 DIAGNOSIS — M5442 Lumbago with sciatica, left side: Secondary | ICD-10-CM | POA: Diagnosis not present

## 2016-03-11 DIAGNOSIS — Z79899 Other long term (current) drug therapy: Secondary | ICD-10-CM

## 2016-03-11 DIAGNOSIS — Z79891 Long term (current) use of opiate analgesic: Secondary | ICD-10-CM

## 2016-03-11 DIAGNOSIS — E1165 Type 2 diabetes mellitus with hyperglycemia: Secondary | ICD-10-CM | POA: Diagnosis not present

## 2016-03-11 DIAGNOSIS — B86 Scabies: Secondary | ICD-10-CM

## 2016-03-11 DIAGNOSIS — M5441 Lumbago with sciatica, right side: Secondary | ICD-10-CM | POA: Diagnosis not present

## 2016-03-11 DIAGNOSIS — G609 Hereditary and idiopathic neuropathy, unspecified: Secondary | ICD-10-CM | POA: Diagnosis not present

## 2016-03-11 DIAGNOSIS — M4802 Spinal stenosis, cervical region: Secondary | ICD-10-CM

## 2016-03-11 DIAGNOSIS — M5440 Lumbago with sciatica, unspecified side: Secondary | ICD-10-CM

## 2016-03-11 DIAGNOSIS — E1140 Type 2 diabetes mellitus with diabetic neuropathy, unspecified: Secondary | ICD-10-CM

## 2016-03-11 MED ORDER — OXYCODONE-ACETAMINOPHEN 5-325 MG PO TABS: 1.0000 | ORAL_TABLET | Freq: Four times a day (QID) | ORAL | 0 refills | Status: DC | PRN

## 2016-03-11 MED ORDER — PERMETHRIN 5 % EX CREA: 1.0000 "application " | TOPICAL_CREAM | Freq: Once | CUTANEOUS | 0 refills | Status: DC

## 2016-03-11 MED ORDER — CARVEDILOL 6.25 MG PO TABS: ORAL_TABLET | ORAL | 0 refills | Status: DC

## 2016-03-11 MED ORDER — CETIRIZINE HCL 10 MG PO CAPS: 10.0000 mg | ORAL_CAPSULE | Freq: Every day | ORAL | 0 refills | Status: DC

## 2016-03-11 NOTE — Progress Notes (Signed)
   CC: Continued hand and arm itching.  HPI:  Ms.Lysandra C Raum is a 55 y.o. with past medical history detailed below following up for itching of her hands and forearms that did not resolve after being seen one month ago and treating with permethrin cream and cleaning her linens with hot water. She had initial improvement of her symptoms that worsened again in about 2 weeks or less. She has not noticed any significant change in the appearance of pruritic lesions between her fingers and on her forearms since one month ago.   See problem based assessment and plan below for additional details.  Past Medical History:  Diagnosis Date  . Adhesive capsulitis of left shoulder    Steroid injection Dr. Truman Hayward 1/12  . Adhesive capsulitis of right shoulder    Steroid injection Dr. Truman Hayward 1/12  . Adhesive capsulitis of shoulder    bilateral, Steroid injection Dr. Truman Hayward 1/12 bilaterally  . CAD (coronary artery disease)    nonobstructive. Last cardiac cath (2008) showing left circumflex with mid 50% stenosis and distal luminal irregularities. Also with RCA with mid to distal 30-40% stenosis. // Previously evaluated by [name], never followed up outpatient.  Marland Kitchen CAP (community acquired pneumonia) 06/15/2012   05/2012 CXR: Mild opacification of the posterior lung base on the lateral film  as cannot exclude infection/atelectasis.    . CHF (congestive heart failure) ([place])   . Diabetes mellitus    Type II, insulin dependent  . Diabetic retinopathy   . GERD (gastroesophageal reflux disease)   . Glaucoma   . Hyperlipidemia   . Hypertension   . Obesity   . Peripheral neuropathy ([place])     Review of Systems:  Review of Systems  Constitutional: Negative for fever.  Eyes: Positive for blurred vision.  Respiratory: Negative for shortness of breath.   Cardiovascular: Negative for chest pain.  Gastrointestinal: Negative for nausea.  Genitourinary: Negative for dysuria.  Musculoskeletal: Negative for  falls.  Skin: Positive for itching and rash.  Neurological: Negative for headaches.  Endo/Heme/Allergies: Positive for environmental allergies.    Physical Exam: GENERAL- alert, co-operative, NAD HEENT- Neck surgical scar appears well heale CARDIAC- RRR, no murmurs, rubs or gallops RESP- CTAB, no wheezes or crackles NEURO- strength upper and lower extremities- 5/5, Sensation intact globally EXTREMITIES- 2-50mm pruritic hypopigmented spots visible on forearms and and on interdigital webs, no pedal edema. PSYCH- Normal mood and affect, appropriate thought content and speech.   Vitals:   03/11/16 1340 03/11/16 1404  BP: (!) 150/57 120/80  Pulse: (!) 116 100  Temp: 98.1 F (36.7 C)   TempSrc: Oral   SpO2: 99%   Weight: 264 lb 3.2 oz (119.8 kg)   Height: 5\' 6"  (1.676 m)      Assessment & Plan:   See Encounters Tab for problem based charting.  Patient discussed with Dr. Dareen Piano

## 2016-03-11 NOTE — Patient Instructions (Addendum)
It was a pleasure to see you today [name].  I have prescribed a higher potency cream for treating your itching. It still may be due to dust mites but if we still see no improvement with this treatment it is likely you are instead having some form of eczema flare. You should also take cetirizine (Zyrtec) 10mg  daily at night to help with the itching.  I expect we are on track for your diabetes and will be excited to see where it is at in November.

## 2016-03-12 ENCOUNTER — Other Ambulatory Visit: Payer: Self-pay | Admitting: Internal Medicine

## 2016-03-12 DIAGNOSIS — I10 Essential (primary) hypertension: Secondary | ICD-10-CM

## 2016-03-12 DIAGNOSIS — G609 Hereditary and idiopathic neuropathy, unspecified: Secondary | ICD-10-CM | POA: Diagnosis not present

## 2016-03-13 ENCOUNTER — Other Ambulatory Visit: Payer: Self-pay | Admitting: Internal Medicine

## 2016-03-13 DIAGNOSIS — G609 Hereditary and idiopathic neuropathy, unspecified: Secondary | ICD-10-CM | POA: Diagnosis not present

## 2016-03-14 DIAGNOSIS — G609 Hereditary and idiopathic neuropathy, unspecified: Secondary | ICD-10-CM | POA: Diagnosis not present

## 2016-03-14 MED ORDER — GLUCOSE BLOOD VI STRP: ORAL_STRIP | 12 refills | Status: DC

## 2016-03-14 MED ORDER — ONETOUCH DELICA LANCETS FINE MISC: 12 refills | Status: AC

## 2016-03-14 NOTE — Assessment & Plan Note (Signed)
Influenza vaccine administered today.

## 2016-03-14 NOTE — Assessment & Plan Note (Signed)
A: [name] continues to have significant chronic back pain for which she is taking chronic opioid analgesics. IT is unclear to me today if the pain has changed much since surgical treatment of her cervical spinal stenosis. She does feel that taking her pain medication significant improves her ability to stay on her feet and perform routine activity around the house or walk outside. She reports taking it about 2-3 times daily so it is possible she might be able to reduce the total prescription and I will plan to reassess at next F/U discussion.  P: Repeat toxassure UDS today Refilled percocet 5-325mg  #90 tabs x2 months

## 2016-03-14 NOTE — Assessment & Plan Note (Signed)
She described her rash to improve then worsen again with trying 1% permethrin treatment. The most likely cause is reinfection due to another carrier in the home. Based on the distribution of her rash it remains unlikely this is a flare of her eczema although if failing treatment again this is a possibility.  P: Prescription for 5% permethrin cream x2 with instruction to treat herself and partner fully Repeated instruction of washing with heat, drying with heat, bagging, and proper use of permethrin

## 2016-03-14 NOTE — Assessment & Plan Note (Signed)
A: She reports much better compliance with her metformin after starting her pill planner last month. She continues to use her lantus insulin 44U qHS and has increased mealtime coverages to 10U. She did not bring her glucometer to the visit today but reports her sugars are mostly in the high 100s and 200s, but had one episode down to 68 with drowsiness in the morning when she did not eat dinner after taking 10U insulin the previous night.  She saw Dr. Katy Fitch for opthalmology about 2 weeks ago and reports she was told her lateral visual field on the right remains pool otherwise was not informed of new major changes.  P: Continue current medications - LAntus 44U qHS, aspart 10U TIDAC, metformin 1g BID RTC in about 2 months

## 2016-03-14 NOTE — Assessment & Plan Note (Signed)
BP well controlled when checked at rest today BP Readings from Last 3 Encounters:  03/11/16 120/80  02/12/16 (!) 154/60  10/27/15 (!) 142/65    Lab Results  Component Value Date   NA 143 02/12/2016   K 4.7 02/12/2016   CREATININE 1.07 (H) 02/12/2016   Well controlled today. Continuing current medications carvedilol 6.25mg , lisinopril 20mg , furosemide 40mg .

## 2016-03-15 DIAGNOSIS — G609 Hereditary and idiopathic neuropathy, unspecified: Secondary | ICD-10-CM | POA: Diagnosis not present

## 2016-03-16 ENCOUNTER — Other Ambulatory Visit: Payer: Self-pay | Admitting: Pharmacist

## 2016-03-16 DIAGNOSIS — G609 Hereditary and idiopathic neuropathy, unspecified: Secondary | ICD-10-CM | POA: Diagnosis not present

## 2016-03-16 NOTE — Patient Outreach (Signed)
Outreach call to Marcene Brawn regarding her request for follow up from the [facility] Medication Adherence Campaign. Left a HIPAA compliant message on the patient's voicemail.  Harlow Asa, PharmD Clinical Pharmacist [place] [facility] [phone]

## 2016-03-17 DIAGNOSIS — G609 Hereditary and idiopathic neuropathy, unspecified: Secondary | ICD-10-CM | POA: Diagnosis not present

## 2016-03-17 NOTE — Progress Notes (Signed)
Internal Medicine Clinic Attending  Case discussed with Dr. Rice at the time of the visit.  We reviewed the resident's history and exam and pertinent patient test results.  I agree with the assessment, diagnosis, and plan of care documented in the resident's note.  

## 2016-03-18 ENCOUNTER — Encounter: Payer: Commercial Managed Care - HMO | Admitting: Internal Medicine

## 2016-03-18 ENCOUNTER — Other Ambulatory Visit: Payer: Self-pay | Admitting: Internal Medicine

## 2016-03-18 DIAGNOSIS — G609 Hereditary and idiopathic neuropathy, unspecified: Secondary | ICD-10-CM | POA: Diagnosis not present

## 2016-03-19 DIAGNOSIS — G609 Hereditary and idiopathic neuropathy, unspecified: Secondary | ICD-10-CM | POA: Diagnosis not present

## 2016-03-19 LAB — TOXASSURE SELECT,+ANTIDEPR,UR

## 2016-03-24 ENCOUNTER — Telehealth: Payer: Self-pay | Admitting: Internal Medicine

## 2016-03-24 ENCOUNTER — Other Ambulatory Visit: Payer: Self-pay | Admitting: Internal Medicine

## 2016-03-24 NOTE — Telephone Encounter (Signed)
Asking for results got a my chart message.

## 2016-03-27 DIAGNOSIS — G609 Hereditary and idiopathic neuropathy, unspecified: Secondary | ICD-10-CM | POA: Diagnosis not present

## 2016-03-28 DIAGNOSIS — G609 Hereditary and idiopathic neuropathy, unspecified: Secondary | ICD-10-CM | POA: Diagnosis not present

## 2016-03-29 DIAGNOSIS — G609 Hereditary and idiopathic neuropathy, unspecified: Secondary | ICD-10-CM | POA: Diagnosis not present

## 2016-03-30 DIAGNOSIS — G609 Hereditary and idiopathic neuropathy, unspecified: Secondary | ICD-10-CM | POA: Diagnosis not present

## 2016-03-31 DIAGNOSIS — G609 Hereditary and idiopathic neuropathy, unspecified: Secondary | ICD-10-CM | POA: Diagnosis not present

## 2016-04-01 DIAGNOSIS — G609 Hereditary and idiopathic neuropathy, unspecified: Secondary | ICD-10-CM | POA: Diagnosis not present

## 2016-04-01 NOTE — Telephone Encounter (Signed)
Called patient to discuss UDS with her. Explained that the test was good and there were no concerns identified. She told me that she had also spoken to Dr. Benjamine Mola about this last night and he had called to tell her the same thing. No further work up for now. She expresses understanding and is in agreement with plan

## 2016-04-02 DIAGNOSIS — G609 Hereditary and idiopathic neuropathy, unspecified: Secondary | ICD-10-CM | POA: Diagnosis not present

## 2016-04-03 DIAGNOSIS — G609 Hereditary and idiopathic neuropathy, unspecified: Secondary | ICD-10-CM | POA: Diagnosis not present

## 2016-04-04 DIAGNOSIS — G609 Hereditary and idiopathic neuropathy, unspecified: Secondary | ICD-10-CM | POA: Diagnosis not present

## 2016-04-05 DIAGNOSIS — G609 Hereditary and idiopathic neuropathy, unspecified: Secondary | ICD-10-CM | POA: Diagnosis not present

## 2016-04-06 DIAGNOSIS — G609 Hereditary and idiopathic neuropathy, unspecified: Secondary | ICD-10-CM | POA: Diagnosis not present

## 2016-04-07 ENCOUNTER — Telehealth: Payer: Self-pay | Admitting: Dietician

## 2016-04-07 DIAGNOSIS — G609 Hereditary and idiopathic neuropathy, unspecified: Secondary | ICD-10-CM | POA: Diagnosis not present

## 2016-04-07 NOTE — Telephone Encounter (Addendum)
Patient calls to request 3 referral. She needs a referral to Dr. Sherley Bounds. Neurosurgery on [facility] before they will see her.  Also, patient added that she'd like to see me for her weight and she'd thinks she'd benefit from physical therapy.

## 2016-04-08 DIAGNOSIS — G609 Hereditary and idiopathic neuropathy, unspecified: Secondary | ICD-10-CM | POA: Diagnosis not present

## 2016-04-09 DIAGNOSIS — G609 Hereditary and idiopathic neuropathy, unspecified: Secondary | ICD-10-CM | POA: Diagnosis not present

## 2016-04-10 DIAGNOSIS — G609 Hereditary and idiopathic neuropathy, unspecified: Secondary | ICD-10-CM | POA: Diagnosis not present

## 2016-04-11 ENCOUNTER — Other Ambulatory Visit: Payer: Self-pay | Admitting: Internal Medicine

## 2016-04-11 ENCOUNTER — Telehealth: Payer: Self-pay | Admitting: Internal Medicine

## 2016-04-11 DIAGNOSIS — E1140 Type 2 diabetes mellitus with diabetic neuropathy, unspecified: Secondary | ICD-10-CM

## 2016-04-11 DIAGNOSIS — G609 Hereditary and idiopathic neuropathy, unspecified: Secondary | ICD-10-CM | POA: Diagnosis not present

## 2016-04-11 DIAGNOSIS — IMO0002 Reserved for concepts with insufficient information to code with codable children: Secondary | ICD-10-CM

## 2016-04-11 DIAGNOSIS — E1165 Type 2 diabetes mellitus with hyperglycemia: Secondary | ICD-10-CM

## 2016-04-11 NOTE — Telephone Encounter (Signed)
APT. REMINDER CALL, LMTCB °

## 2016-04-12 ENCOUNTER — Ambulatory Visit: Payer: Commercial Managed Care - HMO | Admitting: Dietician

## 2016-04-12 DIAGNOSIS — G609 Hereditary and idiopathic neuropathy, unspecified: Secondary | ICD-10-CM | POA: Diagnosis not present

## 2016-04-13 DIAGNOSIS — G609 Hereditary and idiopathic neuropathy, unspecified: Secondary | ICD-10-CM | POA: Diagnosis not present

## 2016-04-14 DIAGNOSIS — G609 Hereditary and idiopathic neuropathy, unspecified: Secondary | ICD-10-CM | POA: Diagnosis not present

## 2016-04-15 DIAGNOSIS — G609 Hereditary and idiopathic neuropathy, unspecified: Secondary | ICD-10-CM | POA: Diagnosis not present

## 2016-04-16 DIAGNOSIS — G609 Hereditary and idiopathic neuropathy, unspecified: Secondary | ICD-10-CM | POA: Diagnosis not present

## 2016-04-17 ENCOUNTER — Other Ambulatory Visit: Payer: Self-pay | Admitting: Internal Medicine

## 2016-04-17 DIAGNOSIS — K219 Gastro-esophageal reflux disease without esophagitis: Secondary | ICD-10-CM

## 2016-04-17 DIAGNOSIS — G609 Hereditary and idiopathic neuropathy, unspecified: Secondary | ICD-10-CM | POA: Diagnosis not present

## 2016-04-18 DIAGNOSIS — G609 Hereditary and idiopathic neuropathy, unspecified: Secondary | ICD-10-CM | POA: Diagnosis not present

## 2016-04-19 DIAGNOSIS — G609 Hereditary and idiopathic neuropathy, unspecified: Secondary | ICD-10-CM | POA: Diagnosis not present

## 2016-04-20 DIAGNOSIS — G609 Hereditary and idiopathic neuropathy, unspecified: Secondary | ICD-10-CM | POA: Diagnosis not present

## 2016-04-21 DIAGNOSIS — G609 Hereditary and idiopathic neuropathy, unspecified: Secondary | ICD-10-CM | POA: Diagnosis not present

## 2016-04-22 DIAGNOSIS — G609 Hereditary and idiopathic neuropathy, unspecified: Secondary | ICD-10-CM | POA: Diagnosis not present

## 2016-04-23 DIAGNOSIS — G609 Hereditary and idiopathic neuropathy, unspecified: Secondary | ICD-10-CM | POA: Diagnosis not present

## 2016-04-24 DIAGNOSIS — G609 Hereditary and idiopathic neuropathy, unspecified: Secondary | ICD-10-CM | POA: Diagnosis not present

## 2016-04-25 DIAGNOSIS — G609 Hereditary and idiopathic neuropathy, unspecified: Secondary | ICD-10-CM | POA: Diagnosis not present

## 2016-04-26 DIAGNOSIS — G609 Hereditary and idiopathic neuropathy, unspecified: Secondary | ICD-10-CM | POA: Diagnosis not present

## 2016-04-27 DIAGNOSIS — G609 Hereditary and idiopathic neuropathy, unspecified: Secondary | ICD-10-CM | POA: Diagnosis not present

## 2016-04-28 DIAGNOSIS — G609 Hereditary and idiopathic neuropathy, unspecified: Secondary | ICD-10-CM | POA: Diagnosis not present

## 2016-04-29 DIAGNOSIS — G609 Hereditary and idiopathic neuropathy, unspecified: Secondary | ICD-10-CM | POA: Diagnosis not present

## 2016-04-30 DIAGNOSIS — G609 Hereditary and idiopathic neuropathy, unspecified: Secondary | ICD-10-CM | POA: Diagnosis not present

## 2016-05-01 DIAGNOSIS — G609 Hereditary and idiopathic neuropathy, unspecified: Secondary | ICD-10-CM | POA: Diagnosis not present

## 2016-05-02 ENCOUNTER — Encounter: Payer: Self-pay | Admitting: Internal Medicine

## 2016-05-02 ENCOUNTER — Other Ambulatory Visit: Payer: Self-pay | Admitting: Internal Medicine

## 2016-05-02 DIAGNOSIS — I10 Essential (primary) hypertension: Secondary | ICD-10-CM

## 2016-05-02 DIAGNOSIS — G609 Hereditary and idiopathic neuropathy, unspecified: Secondary | ICD-10-CM | POA: Diagnosis not present

## 2016-05-02 DIAGNOSIS — M4802 Spinal stenosis, cervical region: Secondary | ICD-10-CM

## 2016-05-02 DIAGNOSIS — E113499 Type 2 diabetes mellitus with severe nonproliferative diabetic retinopathy without macular edema, unspecified eye: Secondary | ICD-10-CM | POA: Insufficient documentation

## 2016-05-02 NOTE — Telephone Encounter (Signed)
Pain med and lasix refill

## 2016-05-03 DIAGNOSIS — M4802 Spinal stenosis, cervical region: Secondary | ICD-10-CM | POA: Diagnosis not present

## 2016-05-03 DIAGNOSIS — G609 Hereditary and idiopathic neuropathy, unspecified: Secondary | ICD-10-CM | POA: Diagnosis not present

## 2016-05-04 DIAGNOSIS — G609 Hereditary and idiopathic neuropathy, unspecified: Secondary | ICD-10-CM | POA: Diagnosis not present

## 2016-05-04 NOTE — Telephone Encounter (Signed)
Pt want to know if Rx is ready. Please call pt back.

## 2016-05-05 ENCOUNTER — Other Ambulatory Visit: Payer: Self-pay | Admitting: Neurological Surgery

## 2016-05-05 DIAGNOSIS — M4802 Spinal stenosis, cervical region: Secondary | ICD-10-CM

## 2016-05-05 DIAGNOSIS — G609 Hereditary and idiopathic neuropathy, unspecified: Secondary | ICD-10-CM | POA: Diagnosis not present

## 2016-05-05 MED ORDER — FUROSEMIDE 40 MG PO TABS: 20.0000 mg | ORAL_TABLET | Freq: Every day | ORAL | 1 refills | Status: DC

## 2016-05-05 NOTE — Telephone Encounter (Signed)
Call from pt checking on the status of her refill, pt informed request is too soon and she needs follow up in our office to continue getting refills-has appt already scheduled on 12/01 with pcp.  Although pt states that her pain has gotten worse, she does not want to see anyone else other than her pcp at this time.  She will pick up rx on 11/17 and f/u with pcp on 12/01. [name], Collin Rengel Cassady11/9/201710:33 AM

## 2016-05-05 NOTE — Telephone Encounter (Signed)
Medication request is too soon for pain refill, should be 11/17 per previous notes.  Also prescribing Hx reviewed with Pharmacy: #40 tablets 10/31 #90 tablets 10/14 #60 tablets 9/30 #90 tablets 9/19  Concurrent prescribing by Dr. Ronnald Ramp (Neurosurgery) and myself is more than her planned amount based on medication contract so we will need her to make an appointment before continuing these refills. I can refill it once for 11/17 if she cannot make an appointment before then but otherwise needs appt.

## 2016-05-06 DIAGNOSIS — G609 Hereditary and idiopathic neuropathy, unspecified: Secondary | ICD-10-CM | POA: Diagnosis not present

## 2016-05-07 DIAGNOSIS — G609 Hereditary and idiopathic neuropathy, unspecified: Secondary | ICD-10-CM | POA: Diagnosis not present

## 2016-05-08 DIAGNOSIS — G609 Hereditary and idiopathic neuropathy, unspecified: Secondary | ICD-10-CM | POA: Diagnosis not present

## 2016-05-09 DIAGNOSIS — G609 Hereditary and idiopathic neuropathy, unspecified: Secondary | ICD-10-CM | POA: Diagnosis not present

## 2016-05-10 ENCOUNTER — Emergency Department (HOSPITAL_COMMUNITY): Payer: Commercial Managed Care - HMO

## 2016-05-10 ENCOUNTER — Emergency Department (HOSPITAL_COMMUNITY)
Admission: EM | Admit: 2016-05-10 | Discharge: 2016-05-10 | Disposition: A | Discharge status: Home / Self Care | Payer: Commercial Managed Care - HMO | Attending: Emergency Medicine | Admitting: Emergency Medicine

## 2016-05-10 ENCOUNTER — Encounter (HOSPITAL_COMMUNITY): Payer: Self-pay | Admitting: Emergency Medicine

## 2016-05-10 DIAGNOSIS — E1140 Type 2 diabetes mellitus with diabetic neuropathy, unspecified: Secondary | ICD-10-CM | POA: Diagnosis not present

## 2016-05-10 DIAGNOSIS — E113499 Type 2 diabetes mellitus with severe nonproliferative diabetic retinopathy without macular edema, unspecified eye: Secondary | ICD-10-CM | POA: Insufficient documentation

## 2016-05-10 DIAGNOSIS — I5020 Unspecified systolic (congestive) heart failure: Secondary | ICD-10-CM | POA: Diagnosis not present

## 2016-05-10 DIAGNOSIS — Z7984 Long term (current) use of oral hypoglycemic drugs: Secondary | ICD-10-CM | POA: Insufficient documentation

## 2016-05-10 DIAGNOSIS — Z7982 Long term (current) use of aspirin: Secondary | ICD-10-CM | POA: Insufficient documentation

## 2016-05-10 DIAGNOSIS — I2510 Atherosclerotic heart disease of native coronary artery without angina pectoris: Secondary | ICD-10-CM | POA: Diagnosis not present

## 2016-05-10 DIAGNOSIS — R0789 Other chest pain: Secondary | ICD-10-CM | POA: Diagnosis not present

## 2016-05-10 DIAGNOSIS — Z79899 Other long term (current) drug therapy: Secondary | ICD-10-CM | POA: Diagnosis not present

## 2016-05-10 DIAGNOSIS — Z794 Long term (current) use of insulin: Secondary | ICD-10-CM | POA: Diagnosis not present

## 2016-05-10 DIAGNOSIS — R079 Chest pain, unspecified: Secondary | ICD-10-CM

## 2016-05-10 DIAGNOSIS — Z87891 Personal history of nicotine dependence: Secondary | ICD-10-CM | POA: Insufficient documentation

## 2016-05-10 DIAGNOSIS — I110 Hypertensive heart disease with heart failure: Secondary | ICD-10-CM | POA: Insufficient documentation

## 2016-05-10 DIAGNOSIS — R1013 Epigastric pain: Secondary | ICD-10-CM

## 2016-05-10 DIAGNOSIS — G609 Hereditary and idiopathic neuropathy, unspecified: Secondary | ICD-10-CM | POA: Diagnosis not present

## 2016-05-10 DIAGNOSIS — R100 Acute abdomen: Secondary | ICD-10-CM | POA: Diagnosis not present

## 2016-05-10 LAB — BASIC METABOLIC PANEL
Anion gap: 8 (ref 5–15)
BUN: 17 mg/dL (ref 6–20)
CHLORIDE: 103 mmol/L (ref 101–111)
CO2: 26 mmol/L (ref 22–32)
CREATININE: 0.98 mg/dL (ref 0.44–1.00)
Calcium: 9.4 mg/dL (ref 8.9–10.3)
Glucose, Bld: 270 mg/dL — ABNORMAL HIGH (ref 65–99)
POTASSIUM: 3.8 mmol/L (ref 3.5–5.1)
SODIUM: 137 mmol/L (ref 135–145)

## 2016-05-10 LAB — CBC
HCT: 33.7 % — ABNORMAL LOW (ref 36.0–46.0)
Hemoglobin: 10.8 g/dL — ABNORMAL LOW (ref 12.0–15.0)
MCH: 28.1 pg (ref 26.0–34.0)
MCHC: 32 g/dL (ref 30.0–36.0)
MCV: 87.5 fL (ref 78.0–100.0)
PLATELETS: 274 10*3/uL (ref 150–400)
RBC: 3.85 MIL/uL — AB (ref 3.87–5.11)
RDW: 14.8 % (ref 11.5–15.5)
WBC: 12.9 10*3/uL — AB (ref 4.0–10.5)

## 2016-05-10 LAB — HEPATIC FUNCTION PANEL
ALBUMIN: 3.5 g/dL (ref 3.5–5.0)
ALK PHOS: 73 U/L (ref 38–126)
ALT: 13 U/L — ABNORMAL LOW (ref 14–54)
AST: 18 U/L (ref 15–41)
Bilirubin, Direct: <0.1 mg/dL — ABNORMAL LOW (ref 0.1–0.5)
TOTAL PROTEIN: 6.6 g/dL (ref 6.5–8.1)
Total Bilirubin: 0.2 mg/dL — ABNORMAL LOW (ref 0.3–1.2)

## 2016-05-10 LAB — I-STAT TROPONIN, ED
TROPONIN I, POC: 0.02 ng/mL (ref 0.00–0.08)
Troponin i, poc: 0 ng/mL (ref 0.00–0.08)

## 2016-05-10 LAB — LIPASE, BLOOD: LIPASE: 30 U/L (ref 11–51)

## 2016-05-10 LAB — D-DIMER, QUANTITATIVE (NOT AT ARMC): D DIMER QUANT: 0.56 ug{FEU}/mL — AB (ref 0.00–0.50)

## 2016-05-10 MED ORDER — DICYCLOMINE HCL 20 MG PO TABS: 20.0000 mg | ORAL_TABLET | Freq: Three times a day (TID) | ORAL | 0 refills | Status: DC

## 2016-05-10 MED ORDER — HYDROMORPHONE HCL 2 MG/ML IJ SOLN
1.0000 mg | Freq: Once | INTRAMUSCULAR | Status: AC
  Administered 2016-05-10: 1 mg via INTRAVENOUS
  Filled 2016-05-10: qty 1

## 2016-05-10 MED ORDER — RANITIDINE HCL 150 MG PO TABS: 150.0000 mg | ORAL_TABLET | Freq: Two times a day (BID) | ORAL | 0 refills | Status: DC

## 2016-05-10 MED ORDER — OXYCODONE-ACETAMINOPHEN 5-325 MG PO TABS: 1.0000 | ORAL_TABLET | ORAL | 0 refills | Status: DC | PRN

## 2016-05-10 MED ORDER — ONDANSETRON HCL 4 MG/2ML IJ SOLN
4.0000 mg | Freq: Once | INTRAMUSCULAR | Status: AC
  Administered 2016-05-10: 4 mg via INTRAVENOUS
  Filled 2016-05-10: qty 2

## 2016-05-10 MED ORDER — IBUPROFEN 800 MG PO TABS: 800.0000 mg | ORAL_TABLET | Freq: Three times a day (TID) | ORAL | 0 refills | Status: DC

## 2016-05-10 NOTE — ED Triage Notes (Signed)
Patient reports mid/left chest pain with palpitations , mild SOB and nausea onset this evening , denies emesis or diaphoresis .

## 2016-05-10 NOTE — ED Notes (Signed)
Pt departed in NAD, refused use of wheelchair.  

## 2016-05-10 NOTE — ED Provider Notes (Signed)
[facility] DEPT Provider Note   CSN: [id number] Arrival date & time: 05/10/16  [id number]  By signing my name below, I, Arianna Nassar, attest that this documentation has been prepared under the direction and in the presence of Orpah Greek, MD.  Electronically Signed: Julien Nordmann, ED Scribe. 05/10/16. 1:51 AM.    History   Chief Complaint Chief Complaint  Patient presents with  . Chest Pain    The history is provided by the patient. No language interpreter was used.   HPI Comments: [name] is a 55 y.o. female who has a PMhx of CAD, GERD, DMII, HTN, HLD, CHF presents to the Emergency Department complaining of intermittent, gradual worsening, moderate left sided chest pain that started earlier this evening. Associated epigastric tenderness, shortness of breath, nausea, headache, and dizziness. She reports that her chest pain earlier felt similar to heartburn then gradually became pain. Her chest pain alleviates when lying down. She has not taken any medication to alleviate her pain. Pt denies vomiting or diaphoresis.   Past Medical History:  Diagnosis Date  . Adhesive capsulitis of left shoulder    Steroid injection Dr. Truman Hayward 1/12  . Adhesive capsulitis of right shoulder    Steroid injection Dr. Truman Hayward 1/12  . Adhesive capsulitis of shoulder    bilateral, Steroid injection Dr. Truman Hayward 1/12 bilaterally  . CAD (coronary artery disease)    nonobstructive. Last cardiac cath (2008) showing left circumflex with mid 50% stenosis and distal luminal irregularities. Also with RCA with mid to distal 30-40% stenosis. // Previously evaluated by [facility], never followed up outpatient.  Marland Kitchen CAP (community acquired pneumonia) 06/15/2012   05/2012 CXR: Mild opacification of the posterior lung base on the lateral film  as cannot exclude infection/atelectasis.    . CHF (congestive heart failure) ([place])   . Diabetes mellitus    Type II, insulin dependent  . Diabetic retinopathy   .  GERD (gastroesophageal reflux disease)   . Glaucoma   . Hyperlipidemia   . Hypertension   . Obesity   . Peripheral neuropathy [facility])     Patient Active Problem List   Diagnosis Date Noted  . Encounter for immunization 03/14/2016  . Scabies 02/12/2016  . S/P cervical spinal fusion 10/22/2015  . Spinal stenosis in cervical region 09/29/2015  . Neck pain on right side 08/07/2015  . Eczema 02/19/2015  . Right arm pain 12/04/2014  . Bilateral low back pain with sciatica   . Pain medication agreement 07/11/2014  . Onychomycosis 10/29/2013  . Sciatic pain 05/15/2013  . Severe nonproliferative diabetic retinopathy ([place]) 04/04/2013  . Primary open-angle glaucoma(365.11) 04/04/2013  . Breast cancer screening 12/05/2012  . Bilateral knee pain 10/29/2012  . Abnormality of gait 09/29/2012  . Insomnia 06/15/2012  . Systolic CHF ([place]) 99991111  . CAD (coronary artery disease) 11/02/2011  . Leg swelling 07/13/2011  . Preventive measure 07/13/2011  . Adhesive capsulitis of right shoulder 11/16/2010  . Gastroesophageal reflux disease 10/12/2009  . Uncontrolled type 2 diabetes with neuropathy ([place]) 06/12/2006  . Hyperlipidemia 06/12/2006  . OBESITY 06/12/2006  . Essential hypertension 06/12/2006    Past Surgical History:  Procedure Laterality Date  . CARDIAC CATHETERIZATION    . CATARACT EXTRACTION    . GLAUCOMA SURGERY    . LEFT HEART CATHETERIZATION WITH CORONARY ANGIOGRAM N/A 01/24/2012   Procedure: LEFT HEART CATHETERIZATION WITH CORONARY ANGIOGRAM;  Surgeon: Sherren Mocha, MD;  Location: [facility] CATH LAB;  Service: Cardiovascular;  Laterality: N/A;  . POSTERIOR CERVICAL FUSION/FORAMINOTOMY  N/A 10/22/2015   Procedure: Cervical three-Cervical Seven Posterior cervical fusion with lateral mass fixation,  Laminectomy Cervical Three-Cervical Seven;  Surgeon: Eustace Moore, MD;  Location: [facility] NEURO ORS;  Service: Neurosurgery;  Laterality: N/A;  Posterior  . TUBAL LIGATION      OB History     No data available       Home Medications    Prior to Admission medications   Medication Sig Start Date End Date Taking? Authorizing Provider  amitriptyline (ELAVIL) 25 MG tablet TAKE 1 TABLET BY MOUTH AT BEDTIME 03/15/16  Yes Collier Salina, MD  aspirin EC 81 MG EC tablet Take 1 tablet (81 mg total) by mouth daily. 05/01/15  Yes Erma Heritage, PA  carvedilol (COREG) 6.25 MG tablet Take 1 tablet by mouth twice daily. 03/11/16  Yes Collier Salina, MD  Cetirizine HCl 10 MG CAPS Take 1 capsule (10 mg total) by mouth at bedtime. Patient taking differently: Take 10 mg by mouth at bedtime as needed (allergies).  03/11/16  Yes Collier Salina, MD  cyclobenzaprine (FLEXERIL) 10 MG tablet Take 1 tablet (10 mg total) by mouth 3 (three) times daily as needed for muscle spasms. 10/27/15  Yes Eustace Moore, MD  dorzolamide-timolol (COSOPT) 22.3-6.8 MG/ML ophthalmic solution Place 1 drop into both eyes 2 (two) times daily.    Yes Historical Provider, MD  ferrous sulfate 325 (65 FE) MG tablet Take 1 tablet (325 mg total) by mouth daily with breakfast. 06/15/12  Yes Dorian Heckle, MD  furosemide (LASIX) 40 MG tablet Take 0.5 tablets (20 mg total) by mouth daily. 05/05/16  Yes Collier Salina, MD  gabapentin (NEURONTIN) 300 MG capsule TAKE ONE CAPSULE BY MOUTH THREE TIMES DAILY Patient taking differently: TAKE ONE CAPSULE BY MOUTH TWO TIMES DAILY 03/25/16  Yes Collier Salina, MD  glucose blood (ONETOUCH VERIO) test strip Check blood sugar as instructed up to 3 times a day 03/14/16  Yes Collier Salina, MD  insulin aspart (NOVOLOG FLEXPEN) 100 UNIT/ML FlexPen Inject 10 Units into the skin 3 (three) times daily with meals. Patient taking differently: Inject 6-12 Units into the skin 3 (three) times daily with meals.  04/13/16  Yes Axel Filler, MD  Insulin Pen Needle (B-D UF III MINI PEN NEEDLES) 31G X 5 MM MISC Use to inject insulin 4-5 times daily   Yes Madilyn Fireman, MD  LANTUS  SOLOSTAR 100 UNIT/ML Solostar Pen INJECT 44 UNITS INTO THE SKIN AT BEDTIME 03/18/16  Yes Collier Salina, MD  latanoprost (XALATAN) 0.005 % ophthalmic solution Place 1 drop into both eyes at bedtime. Reported on 10/29/2015 05/12/15  Yes Historical Provider, MD  lisinopril (PRINIVIL,ZESTRIL) 20 MG tablet Take 0.5 tablets (10 mg total) by mouth daily. 09/09/15  Yes Tasrif Ahmed, MD  metFORMIN (GLUCOPHAGE) 1000 MG tablet TAKE 1 TABLET TWICE DAILY WITH A MEAL 01/04/16  Yes Collier Salina, MD  ondansetron (ZOFRAN) 4 MG tablet Take 1 tablet (4 mg total) by mouth every 8 (eight) hours as needed for nausea or vomiting. 03/31/15  Yes Corky Sox, MD  [facility] DELICA LANCETS FINE MISC Check blood sugar as instructed up to 3 times a day 03/14/16  Yes Collier Salina, MD  oxyCODONE-acetaminophen (PERCOCET/ROXICET) 5-325 MG tablet Take 1-2 tablets by mouth every 6 (six) hours as needed for moderate pain. 04/13/16 05/13/16 Yes Collier Salina, MD  pantoprazole (PROTONIX) 40 MG tablet Take 1 tablet (40 mg total) by mouth daily. 04/18/16  Yes Oval Linsey, MD  pravastatin (PRAVACHOL) 20 MG tablet Take 1 tablet (20 mg total) by mouth daily. 12/04/14  Yes Luan Moore, MD  dicyclomine (BENTYL) 20 MG tablet Take 1 tablet (20 mg total) by mouth 3 (three) times daily before meals. 05/10/16   Orpah Greek, MD  ranitidine (ZANTAC) 150 MG tablet Take 1 tablet (150 mg total) by mouth 2 (two) times daily. 05/10/16   Orpah Greek, MD    Family History Family History  Problem Relation Age of Onset  . Hypertension Sister   . Hypertension Sister   . Hypertension Sister   . Heart attack Neg Hx   . Stroke Neg Hx     Social History Social History  Substance Use Topics  . Smoking status: Former Smoker    Packs/day: 0.30    Years: 7.00    Types: Cigarettes    Quit date: 06/27/2006  . Smokeless tobacco: Never Used  . Alcohol use No     Allergies   Patient has no known allergies.   Review  of Systems Review of Systems  Constitutional: Negative for diaphoresis.  Respiratory: Positive for shortness of breath.   Cardiovascular: Positive for chest pain.  Gastrointestinal: Positive for abdominal pain and nausea. Negative for vomiting.  All other systems reviewed and are negative.    Physical Exam Updated Vital Signs BP 175/83   Pulse 92   Temp 97.6 F (36.4 C) (Oral)   Resp 14   LMP 02/13/2011   SpO2 96%   Physical Exam  Constitutional: She is oriented to person, place, and time. She appears well-developed and well-nourished. No distress.  HENT:  Head: Normocephalic and atraumatic.  Right Ear: Hearing normal.  Left Ear: Hearing normal.  Nose: Nose normal.  Mouth/Throat: Oropharynx is clear and moist and mucous membranes are normal.  Eyes: Conjunctivae and EOM are normal. Pupils are equal, round, and reactive to light.  Neck: Normal range of motion. Neck supple.  Cardiovascular: Regular rhythm, S1 normal and S2 normal.  Exam reveals no gallop and no friction rub.   No murmur heard. Pulmonary/Chest: Effort normal and breath sounds normal. No respiratory distress. She exhibits no tenderness.  Severe tenderness to the left chest wall  Abdominal: Soft. Normal appearance and bowel sounds are normal. There is no hepatosplenomegaly. There is tenderness in the right upper quadrant and epigastric area. There is no rebound, no guarding, no tenderness at McBurney's point and negative Murphy's sign. No hernia.  Severe epigastric tenderness  Musculoskeletal: Normal range of motion.  Neurological: She is alert and oriented to person, place, and time. She has normal strength. No cranial nerve deficit or sensory deficit. Coordination normal. GCS eye subscore is 4. GCS verbal subscore is 5. GCS motor subscore is 6.  Skin: Skin is warm, dry and intact. No rash noted. No cyanosis.  Psychiatric: She has a normal mood and affect. Her speech is normal and behavior is normal. Thought content  normal.  Nursing note and vitals reviewed.    ED Treatments / Results  DIAGNOSTIC STUDIES: Oxygen Saturation is 99% on RA, normal by my interpretation.  COORDINATION OF CARE:  1:47 AM Discussed treatment plan with pt at bedside and pt agreed to plan.  Labs (all labs ordered are listed, but only abnormal results are displayed) Labs Reviewed  BASIC METABOLIC PANEL - Abnormal; Notable for the following:       Result Value   Glucose, Bld 270 (*)    All other components within normal  limits  CBC - Abnormal; Notable for the following:    WBC 12.9 (*)    RBC 3.85 (*)    Hemoglobin 10.8 (*)    HCT 33.7 (*)    All other components within normal limits  HEPATIC FUNCTION PANEL - Abnormal; Notable for the following:    ALT 13 (*)    Total Bilirubin 0.2 (*)    Bilirubin, Direct <0.1 (*)    All other components within normal limits  D-DIMER, QUANTITATIVE (NOT AT [facility]) - Abnormal; Notable for the following:    D-Dimer, Quant 0.56 (*)    All other components within normal limits  LIPASE, BLOOD  I-STAT TROPOININ, ED  I-STAT TROPOININ, ED    EKG  EKG Interpretation  Date/Time:  Tuesday May 10 2016 00:39:23 EST Ventricular Rate:  101 PR Interval:  160 QRS Duration: 88 QT Interval:  328 QTC Calculation: 425 R Axis:   27 Text Interpretation:  Sinus tachycardia Otherwise normal ECG Confirmed by Macall Mccroskey  MD, Lessie Manigo [phone]) on 05/10/2016 1:49:31 AM       Radiology Dg Chest 2 View  Result Date: 05/10/2016 CLINICAL DATA:  Chest pain and dyspnea tonight. EXAM: CHEST  2 VIEW COMPARISON:  04/30/2015 FINDINGS: The lungs are clear. The pulmonary vasculature is normal. Heart size is normal. Hilar and mediastinal contours are unremarkable. There is no pleural effusion. IMPRESSION: No active cardiopulmonary disease. Electronically Signed   By: Andreas Newport M.D.   On: 05/10/2016 01:08   US Abdomen Limited Ruq  Result Date: 05/10/2016 CLINICAL DATA:  Initial evaluation for  acute abdominal pain. EXAM: US ABDOMEN LIMITED - RIGHT UPPER QUADRANT COMPARISON:  None. FINDINGS: Gallbladder: No gallstones or wall thickening visualized. No sonographic Murphy sign noted by sonographer. Common bile duct: Diameter: 4 mm Liver: No focal lesion identified. Diffusely increased echogenicity, suggestive of steatosis. IMPRESSION: 1. Normal sonographic appearance of the gallbladder. No biliary dilatation. 2. Hepatic steatosis. Electronically Signed   By: Jeannine Boga M.D.   On: 05/10/2016 05:51    Procedures Procedures (including critical care time)  Medications Ordered in ED Medications  HYDROmorphone (DILAUDID) injection 1 mg (1 mg Intravenous Given 05/10/16 0454)  ondansetron (ZOFRAN) injection 4 mg (4 mg Intravenous Given 05/10/16 0454)     Initial Impression / Assessment and Plan / ED Course  I have reviewed the triage vital signs and the nursing notes.  Pertinent labs & imaging results that were available during my care of the patient were reviewed by me and considered in my medical decision making (see chart for details).  Clinical Course   Patient presents with complaints of chest pain and abdominal pain. Been present for at least 6 hours at time of arrival to the ER. Pain is on the left side of her chest. She reports associated mild shortness of breath, nausea and dizziness. She is now complaining of headache as well. In addition to the nausea she has now developed epigastric and bilateral upper abdominal pain.  Patient has a heart score of 3. She has cardiac risk factors, however, pain is atypical for cardiac etiology. Pain came on at rest, no alleviating or exacerbating factors, including no worsening with exertion. Patient did report mild shortness of breath, but she is not tachycardic or hypoxic. D-dimer 0.56. Age correction of D-dimer would be normal under 0.57.  Repeat examination reveals chest pain is improved but she is still expressing abdominal pain. Pain  is epigastric. Examination, however, did reveal some right upper quadrant tenderness. Patient also reports  she was told in the past that she had gallbladder problems but never followed up or had surgery. Ultrasound was performed, she has a normal gallbladder ultrasound.  HEART Score for Major Cardiac Events from [email or web address]  on 05/10/2016 ** All calculations should be rechecked by clinician prior to use **  RESULT SUMMARY: 3 points Low Score (0-3 points)  Risk of MACE of 0.9-1.7%.   INPUTS: History -> 0 = Slightly suspicious EKG -> 0 = Normal Age -> 1 = 45-65 Risk factors -> 2 = =3 risk factors or history of atherosclerotic disease Initial troponin -> 0 = =normal limit   I personally performed the services described in this documentation, which was scribed in my presence. The recorded information has been reviewed and is accurate.   Final Clinical Impressions(s) / ED Diagnoses   Final diagnoses:  Nonspecific chest pain  Epigastric pain    New Prescriptions New Prescriptions   DICYCLOMINE (BENTYL) 20 MG TABLET    Take 1 tablet (20 mg total) by mouth 3 (three) times daily before meals.   RANITIDINE (ZANTAC) 150 MG TABLET    Take 1 tablet (150 mg total) by mouth 2 (two) times daily.     Orpah Greek, MD 05/10/16 [phone]

## 2016-05-11 ENCOUNTER — Ambulatory Visit
Admission: RE | Admit: 2016-05-11 | Discharge: 2016-05-11 | Disposition: A | Discharge status: Home / Self Care | Payer: Commercial Managed Care - HMO | Source: Ambulatory Visit | Attending: Neurological Surgery | Admitting: Neurological Surgery

## 2016-05-11 DIAGNOSIS — M4802 Spinal stenosis, cervical region: Secondary | ICD-10-CM

## 2016-05-11 DIAGNOSIS — G609 Hereditary and idiopathic neuropathy, unspecified: Secondary | ICD-10-CM | POA: Diagnosis not present

## 2016-05-12 ENCOUNTER — Telehealth: Payer: Self-pay | Admitting: Internal Medicine

## 2016-05-12 DIAGNOSIS — G609 Hereditary and idiopathic neuropathy, unspecified: Secondary | ICD-10-CM | POA: Diagnosis not present

## 2016-05-12 NOTE — Telephone Encounter (Signed)
APT. REMINDER CALL, LMTCB °

## 2016-05-13 ENCOUNTER — Ambulatory Visit (INDEPENDENT_AMBULATORY_CARE_PROVIDER_SITE_OTHER): Payer: Commercial Managed Care - HMO | Admitting: Internal Medicine

## 2016-05-13 ENCOUNTER — Other Ambulatory Visit: Payer: Self-pay | Admitting: Internal Medicine

## 2016-05-13 ENCOUNTER — Encounter: Payer: Self-pay | Admitting: Internal Medicine

## 2016-05-13 VITALS — BP 146/50 | HR 98 | Temp 98.5°F | Wt 265.4 lb

## 2016-05-13 DIAGNOSIS — E119 Type 2 diabetes mellitus without complications: Secondary | ICD-10-CM

## 2016-05-13 DIAGNOSIS — Z6841 Body Mass Index (BMI) 40.0 and over, adult: Secondary | ICD-10-CM

## 2016-05-13 DIAGNOSIS — G609 Hereditary and idiopathic neuropathy, unspecified: Secondary | ICD-10-CM | POA: Diagnosis not present

## 2016-05-13 DIAGNOSIS — M542 Cervicalgia: Secondary | ICD-10-CM | POA: Diagnosis not present

## 2016-05-13 DIAGNOSIS — Z7984 Long term (current) use of oral hypoglycemic drugs: Secondary | ICD-10-CM

## 2016-05-13 DIAGNOSIS — K760 Fatty (change of) liver, not elsewhere classified: Secondary | ICD-10-CM | POA: Diagnosis not present

## 2016-05-13 MED ORDER — CYCLOBENZAPRINE HCL 10 MG PO TABS: 10.0000 mg | ORAL_TABLET | Freq: Three times a day (TID) | ORAL | 2 refills | Status: DC | PRN

## 2016-05-13 MED ORDER — HYDROCODONE-ACETAMINOPHEN 5-325 MG PO TABS: 1.0000 | ORAL_TABLET | Freq: Four times a day (QID) | ORAL | 0 refills | Status: DC | PRN

## 2016-05-13 NOTE — Progress Notes (Signed)
    CC: follow up for nausea  HPI: [name] is a 55 y.o. female with PMHx of CAD, chronic HFrEF, HTN, GERD, and T2DM who presents to the clinic for ED follow up.   Patient was seen in the ED on 05/10/16 with complaint of nausea, chest pain, abdominal pain, and shortness of breath. Vital signs were stable, labs revealed normal CMET, mild leukocytosis, negative troponin x 2, normal CXR, and EKG with sinus tachycardia without ischemic changes. Abdominal US was obtained which showed hepatic steatosis.   Since then, her nausea and pain have improved. She also admits to diarrhea which has improved. She feels her dicyclomine has helped.   Past Medical History:  Diagnosis Date  . Adhesive capsulitis of left shoulder    Steroid injection Dr. Truman Hayward 1/12  . Adhesive capsulitis of right shoulder    Steroid injection Dr. Truman Hayward 1/12  . Adhesive capsulitis of shoulder    bilateral, Steroid injection Dr. Truman Hayward 1/12 bilaterally  . CAD (coronary artery disease)    nonobstructive. Last cardiac cath (2008) showing left circumflex with mid 50% stenosis and distal luminal irregularities. Also with RCA with mid to distal 30-40% stenosis. // Previously evaluated by [facility], never followed up outpatient.  Marland Kitchen CAP (community acquired pneumonia) 06/15/2012   05/2012 CXR: Mild opacification of the posterior lung base on the lateral film  as cannot exclude infection/atelectasis.    . CHF (congestive heart failure) ([place])   . Diabetes mellitus    Type II, insulin dependent  . Diabetic retinopathy   . GERD (gastroesophageal reflux disease)   . Glaucoma   . Hyperlipidemia   . Hypertension   . Obesity   . Peripheral neuropathy ([place])     Review of Systems: Please see pertinent ROS reviewed in HPI and problem based charting.    Physical Exam: Vitals:   05/13/16 1459  BP: (!) 146/50  Pulse: 98  Temp: 98.5 F (36.9 C)  TempSrc: Oral  SpO2: 98%  Weight: 265 lb 6.4 oz (120.4 kg)   General:  Vital signs reviewed.  Patient is obese, in no acute distress and cooperative with exam.   Cardiovascular: RRR, S1 normal, S2 normal, no murmurs, gallops, or rubs. Pulmonary/Chest: Clear to auscultation bilaterally, no wheezes, rales, or rhonchi. Abdominal: Soft, non-tender, non-distended, BS +  Extremities: Trace lower extremity edema bilaterally Skin: Warm, dry and intact.  Psychiatric: Normal mood and affect. speech and behavior is normal. Cognition and memory are normal.   Assessment & Plan:  See encounters tab for problem based medical decision making. Patient discussed with Dr. Daryll Drown

## 2016-05-13 NOTE — Telephone Encounter (Signed)
Orders placed in reference to previous call for medication refill. Norco 5-325mg  #90 tablets.  Patient concurrently managed for pain with [place] and also what looks like about #40 tablets norco through her surgeon. Last prescribed 04/26/16 from them. I will refill one time to prevent lapse in treatment until she can come to clinic next month. No additional refills are to be provided until we can meet and reassess her utilization and risk.

## 2016-05-13 NOTE — Assessment & Plan Note (Addendum)
Patient was diagnosed with hepatic steatosis on 05/10/16.  Abdominal US was obtained in the ED which showed hepatic steatosis. This is likely secondary to obesity and T2DM. We discussed diet and exercise and the importance of weight loss.   Plan: -Weight loss -Continue Metformin -Avoid alcohol (she hasn't drank since 1989) -Consider checking for Hepatitis B and A immunity and vaccinate if not immune -Serial ultrasounds to monitor for progression to cirrhosis

## 2016-05-13 NOTE — Assessment & Plan Note (Signed)
Refilled Flexeril 5 mg TID prn.

## 2016-05-13 NOTE — Patient Instructions (Signed)
[name], [name] were diagnosed with Hepatic Steatosis. I have provided information on this, but just continue working on diet and exercise. Please follow up with your primary care doctor on 12/1 for further management of you diabetes and blood pressure.    Fatty Liver Introduction Fatty liver, also called hepatic steatosis or steatohepatitis, is a condition in which too much fat has built up in your liver cells. The liver removes harmful substances from your bloodstream. It produces fluids your body needs. It also helps your body use and store energy from the food you eat. In many cases, fatty liver does not cause symptoms or problems. It is often diagnosed when tests are being done for other reasons. However, over time, fatty liver can cause inflammation that may lead to more serious liver problems, such as scarring of the liver (cirrhosis). What are the causes? Causes of fatty liver may include:  Drinking too much alcohol.  Poor nutrition.  Obesity.  Cushing syndrome.  Diabetes.  Hyperlipidemia.  Pregnancy.  Certain drugs.  Poisons.  Some viral infections. What increases the risk? You may be more likely to develop fatty liver if you:  Abuse alcohol.  Are pregnant.  Are overweight.  Have diabetes.  Have hepatitis.  Have a high triglyceride level. What are the signs or symptoms? Fatty liver often does not cause any symptoms.  How is this diagnosed? Fatty liver may be diagnosed by:  Physical exam and medical history.  Blood tests.  Imaging tests, such as an ultrasound, CT scan, or MRI.  Liver biopsy. A small sample of liver tissue is removed using a needle. The sample is then looked at under a microscope. How is this treated? Fatty liver is often caused by other health conditions. Treatment for fatty liver may involve medicines and lifestyle changes to manage conditions such as:  Alcoholism.  High cholesterol.  Diabetes.  Being overweight or  obese. Follow these instructions at home:  Eat a healthy diet as directed by your health care provider.  Exercise regularly. This can help you lose weight and control your cholesterol and diabetes. Talk to your health care provider about an exercise plan and which activities are best for you.  Do not drink alcohol.  Take medicines only as directed by your health care provider. Contact a health care provider if: You have difficulty controlling your:  Blood sugar.  Cholesterol.  Alcohol consumption. Get help right away if:  You have abdominal pain.  You have jaundice.  You have nausea and vomiting. This information is not intended to replace advice given to you by your health care provider. Make sure you discuss any questions you have with your health care provider. Document Released: 07/29/2005 Document Revised: 11/19/2015 Document Reviewed: 10/23/2013  2017 [employer]

## 2016-05-14 DIAGNOSIS — G609 Hereditary and idiopathic neuropathy, unspecified: Secondary | ICD-10-CM | POA: Diagnosis not present

## 2016-05-15 DIAGNOSIS — G609 Hereditary and idiopathic neuropathy, unspecified: Secondary | ICD-10-CM | POA: Diagnosis not present

## 2016-05-16 DIAGNOSIS — G609 Hereditary and idiopathic neuropathy, unspecified: Secondary | ICD-10-CM | POA: Diagnosis not present

## 2016-05-17 DIAGNOSIS — G609 Hereditary and idiopathic neuropathy, unspecified: Secondary | ICD-10-CM | POA: Diagnosis not present

## 2016-05-18 ENCOUNTER — Other Ambulatory Visit: Payer: Self-pay | Admitting: Internal Medicine

## 2016-05-18 DIAGNOSIS — G609 Hereditary and idiopathic neuropathy, unspecified: Secondary | ICD-10-CM | POA: Diagnosis not present

## 2016-05-19 DIAGNOSIS — G609 Hereditary and idiopathic neuropathy, unspecified: Secondary | ICD-10-CM | POA: Diagnosis not present

## 2016-05-20 DIAGNOSIS — G609 Hereditary and idiopathic neuropathy, unspecified: Secondary | ICD-10-CM | POA: Diagnosis not present

## 2016-05-20 NOTE — Progress Notes (Signed)
Internal Medicine Clinic Attending  Case discussed with Dr. Burns soon after the resident saw the patient.  We reviewed the resident's history and exam and pertinent patient test results.  I agree with the assessment, diagnosis, and plan of care documented in the resident's note. 

## 2016-05-21 DIAGNOSIS — G609 Hereditary and idiopathic neuropathy, unspecified: Secondary | ICD-10-CM | POA: Diagnosis not present

## 2016-05-22 DIAGNOSIS — G609 Hereditary and idiopathic neuropathy, unspecified: Secondary | ICD-10-CM | POA: Diagnosis not present

## 2016-05-23 ENCOUNTER — Other Ambulatory Visit: Payer: Self-pay | Admitting: Neurological Surgery

## 2016-05-23 DIAGNOSIS — S129XXS Fracture of neck, unspecified, sequela: Secondary | ICD-10-CM | POA: Diagnosis not present

## 2016-05-23 DIAGNOSIS — G609 Hereditary and idiopathic neuropathy, unspecified: Secondary | ICD-10-CM | POA: Diagnosis not present

## 2016-05-23 DIAGNOSIS — M960 Pseudarthrosis after fusion or arthrodesis: Secondary | ICD-10-CM | POA: Diagnosis not present

## 2016-05-24 DIAGNOSIS — G609 Hereditary and idiopathic neuropathy, unspecified: Secondary | ICD-10-CM | POA: Diagnosis not present

## 2016-05-25 DIAGNOSIS — G609 Hereditary and idiopathic neuropathy, unspecified: Secondary | ICD-10-CM | POA: Diagnosis not present

## 2016-05-26 DIAGNOSIS — G609 Hereditary and idiopathic neuropathy, unspecified: Secondary | ICD-10-CM | POA: Diagnosis not present

## 2016-05-27 ENCOUNTER — Ambulatory Visit (INDEPENDENT_AMBULATORY_CARE_PROVIDER_SITE_OTHER): Payer: Commercial Managed Care - HMO | Admitting: Internal Medicine

## 2016-05-27 ENCOUNTER — Encounter: Payer: Self-pay | Admitting: Internal Medicine

## 2016-05-27 VITALS — BP 142/68 | HR 110 | Temp 98.5°F | Ht 66.0 in | Wt 260.6 lb

## 2016-05-27 DIAGNOSIS — M4802 Spinal stenosis, cervical region: Secondary | ICD-10-CM | POA: Diagnosis not present

## 2016-05-27 DIAGNOSIS — E1165 Type 2 diabetes mellitus with hyperglycemia: Secondary | ICD-10-CM | POA: Diagnosis not present

## 2016-05-27 DIAGNOSIS — G609 Hereditary and idiopathic neuropathy, unspecified: Secondary | ICD-10-CM | POA: Diagnosis not present

## 2016-05-27 DIAGNOSIS — Z981 Arthrodesis status: Secondary | ICD-10-CM

## 2016-05-27 DIAGNOSIS — IMO0002 Reserved for concepts with insufficient information to code with codable children: Secondary | ICD-10-CM

## 2016-05-27 DIAGNOSIS — E1140 Type 2 diabetes mellitus with diabetic neuropathy, unspecified: Secondary | ICD-10-CM

## 2016-05-27 DIAGNOSIS — K760 Fatty (change of) liver, not elsewhere classified: Secondary | ICD-10-CM | POA: Diagnosis not present

## 2016-05-27 DIAGNOSIS — Z794 Long term (current) use of insulin: Secondary | ICD-10-CM

## 2016-05-27 DIAGNOSIS — Z0000 Encounter for general adult medical examination without abnormal findings: Secondary | ICD-10-CM

## 2016-05-27 DIAGNOSIS — Z23 Encounter for immunization: Secondary | ICD-10-CM | POA: Diagnosis not present

## 2016-05-27 LAB — GLUCOSE, CAPILLARY: GLUCOSE-CAPILLARY: 441 mg/dL — AB (ref 65–99)

## 2016-05-27 LAB — POCT GLYCOSYLATED HEMOGLOBIN (HGB A1C): Hemoglobin A1C: 9

## 2016-05-27 MED ORDER — OXYCODONE HCL 5 MG PO TABS: 5.0000 mg | ORAL_TABLET | ORAL | 0 refills | Status: DC | PRN

## 2016-05-27 MED ORDER — INSULIN GLARGINE 100 UNIT/ML SOLOSTAR PEN: 50.0000 [IU] | PEN_INJECTOR | Freq: Every day | SUBCUTANEOUS | 0 refills | Status: DC

## 2016-05-27 MED ORDER — INSULIN ASPART 100 UNIT/ML FLEXPEN: 12.0000 [IU] | PEN_INJECTOR | Freq: Three times a day (TID) | SUBCUTANEOUS | 2 refills | Status: DC

## 2016-05-27 NOTE — Progress Notes (Signed)
   CC: Neck and leg pain  HPI:  [name] is a 55 y.o. woman with a medical history detailed below who is here for follow up management of her chronic pain, diabetes, and her recent abdominal ultrasound findings concerning for hepatic steatosis. She has recently had an increase in her pain after recent cervical fusion surgery due to progressive spinal stenosis identified earlier this year. She is following up with neurosurgery who now plans for an additional procedure on 06/16/16 from an anterior approach.  See problem based assessment and plan below for additional details.  Past Medical History:  Diagnosis Date  . Adhesive capsulitis of left shoulder    Steroid injection Dr. Truman Hayward 1/12  . Adhesive capsulitis of right shoulder    Steroid injection Dr. Truman Hayward 1/12  . Adhesive capsulitis of shoulder    bilateral, Steroid injection Dr. Truman Hayward 1/12 bilaterally  . CAD (coronary artery disease)    nonobstructive. Last cardiac cath (2008) showing left circumflex with mid 50% stenosis and distal luminal irregularities. Also with RCA with mid to distal 30-40% stenosis. // Previously evaluated by [facility], never followed up outpatient.  Marland Kitchen CAP (community acquired pneumonia) 06/15/2012   05/2012 CXR: Mild opacification of the posterior lung base on the lateral film  as cannot exclude infection/atelectasis.    . CHF (congestive heart failure) ([place])   . Diabetes mellitus    Type II, insulin dependent  . Diabetic retinopathy   . GERD (gastroesophageal reflux disease)   . Glaucoma   . Hyperlipidemia   . Hypertension   . Obesity   . Peripheral neuropathy ([place])     Review of Systems:   Review of Systems  Constitutional: Negative for fever.  Eyes: Negative for blurred vision.  Respiratory: Positive for shortness of breath.   Cardiovascular: Negative for chest pain.  Gastrointestinal: Negative for diarrhea.  Musculoskeletal: Positive for joint pain and neck pain. Negative for falls.    Skin: Negative for rash.  Neurological: Positive for sensory change and headaches.     Physical Exam:  Vitals:   05/27/16 1505  BP: (!) 142/68  Pulse: (!) 110  Temp: 98.5 F (36.9 C)  TempSrc: Oral  SpO2: 100%  Weight: 260 lb 9.6 oz (118.2 kg)  Height: 5\' 6"  (1.676 m)   GENERAL- alert, co-operative, NAD HEENT- Posterior vertical incision is well healed, paraspinal muscle tenderness to palpation  CARDIAC- RRR, no murmurs RESP- CTAB, no wheezes or crackles ABDOMEN- Soft, nontender NEURO- Strength upper and lower extremities- 5/5, Sensation intact globally EXTREMITIES- Trace pedal edema bilaterally PSYCH- Normal mood and affect, appropriate thought content and speech.    Assessment & Plan:   See Encounters Tab for problem based charting.  Patient discussed with Dr. Evette Doffing

## 2016-05-27 NOTE — Patient Instructions (Signed)
It was a pleasure to see you today.  I have changed your pain medicine to 5 mg oxycodone which you can take up to every 4 hours as needed for pain. We will need to reassess this again after her surgery in December. You should not start taking this if you are also taking the Norco at the same time.  Prediabetes I recommend increasing your Lantus insulin to 50 units daily. Also increase your mealtime aspart to 12 units 3 times daily. Please check your sugars at home at least 3 times a day for the next 2 weeks before returning to clinic so we can see how the blood sugar is controlled.  We will check several blood tests today to make sure you do not need additional vaccines to protect your liver from infection. We can follow these up at your next appointment in a few weeks.

## 2016-05-28 DIAGNOSIS — G609 Hereditary and idiopathic neuropathy, unspecified: Secondary | ICD-10-CM | POA: Diagnosis not present

## 2016-05-28 LAB — HEPATITIS B CORE ANTIBODY, TOTAL: HEP B C TOTAL AB: NEGATIVE

## 2016-05-28 LAB — HEPATITIS A ANTIBODY, TOTAL: HEP A TOTAL AB: NEGATIVE

## 2016-05-28 LAB — HEPATITIS B SURFACE ANTIBODY,QUALITATIVE: Hep B Surface Ab, Qual: NONREACTIVE

## 2016-05-28 LAB — HEPATITIS C ANTIBODY: Hep C Virus Ab: 0.1 s/co ratio (ref 0.0–0.9)

## 2016-05-28 LAB — HEPATITIS B SURFACE ANTIGEN: Hepatitis B Surface Ag: NEGATIVE

## 2016-05-29 DIAGNOSIS — G609 Hereditary and idiopathic neuropathy, unspecified: Secondary | ICD-10-CM | POA: Diagnosis not present

## 2016-05-30 DIAGNOSIS — G609 Hereditary and idiopathic neuropathy, unspecified: Secondary | ICD-10-CM | POA: Diagnosis not present

## 2016-05-30 NOTE — Assessment & Plan Note (Signed)
She is having worsened pain and received some supplemental dose from neurosurgery clinic. I will increase her opioid prescription to 5mg  oxycodone up to per 4 hours as needed until her upcoming surgery next month on 12/21.  Oxycodone 5mg  #120 tablets

## 2016-05-30 NOTE — Assessment & Plan Note (Signed)
Prevnar vaccine administered today.

## 2016-05-30 NOTE — Progress Notes (Signed)
Internal Medicine Clinic Attending  Case discussed with Dr. Rice at the time of the visit.  We reviewed the resident's history and exam and pertinent patient test results.  I agree with the assessment, diagnosis, and plan of care documented in the resident's note.  

## 2016-05-30 NOTE — Assessment & Plan Note (Signed)
Newly diagnosed NAFLD. Fortunately she has not been drinking alcohol for almost 30 years and is negative for hepatitis C. Screening also reveals no serologic immunity to hepatitis A or B.  She will need immunization for both in the future to reduce the risk of superimposed acute infection on her liver. We will continue metformin Weight loss would be ideal but has been difficult for her in the past.

## 2016-05-30 NOTE — Assessment & Plan Note (Addendum)
A: Her diabetes ciontrol is improving but still not great. Hgb A1c today is 9.0%. I would like to get these optimized better before an upcoming surgery since this will reduce the risk of complications.  Continue metformin 1g BID Increase basal insulin from 44U to 50U. Increase prandial insulin from 10U to 12U TIDAC. F/U in approximately 2 weeks WITH glucometer to review for additional adjustments

## 2016-05-31 ENCOUNTER — Other Ambulatory Visit: Payer: Self-pay | Admitting: Internal Medicine

## 2016-05-31 DIAGNOSIS — G609 Hereditary and idiopathic neuropathy, unspecified: Secondary | ICD-10-CM | POA: Diagnosis not present

## 2016-06-01 ENCOUNTER — Telehealth: Payer: Self-pay

## 2016-06-01 DIAGNOSIS — G609 Hereditary and idiopathic neuropathy, unspecified: Secondary | ICD-10-CM | POA: Diagnosis not present

## 2016-06-01 DIAGNOSIS — R112 Nausea with vomiting, unspecified: Secondary | ICD-10-CM

## 2016-06-01 NOTE — Telephone Encounter (Signed)
Please call pt back regarding nausea.

## 2016-06-02 DIAGNOSIS — G609 Hereditary and idiopathic neuropathy, unspecified: Secondary | ICD-10-CM | POA: Diagnosis not present

## 2016-06-02 NOTE — Telephone Encounter (Signed)
Called pt back - stated new med for pain, Oxycodone is making her nauseated; stated she has vomiting x 1. Took it this am ; had some nausea. Please advise.

## 2016-06-03 DIAGNOSIS — G609 Hereditary and idiopathic neuropathy, unspecified: Secondary | ICD-10-CM | POA: Diagnosis not present

## 2016-06-04 DIAGNOSIS — G609 Hereditary and idiopathic neuropathy, unspecified: Secondary | ICD-10-CM | POA: Diagnosis not present

## 2016-06-05 DIAGNOSIS — G609 Hereditary and idiopathic neuropathy, unspecified: Secondary | ICD-10-CM | POA: Diagnosis not present

## 2016-06-06 DIAGNOSIS — G609 Hereditary and idiopathic neuropathy, unspecified: Secondary | ICD-10-CM | POA: Diagnosis not present

## 2016-06-06 MED ORDER — ONDANSETRON HCL 4 MG PO TABS: 4.0000 mg | ORAL_TABLET | Freq: Three times a day (TID) | ORAL | 0 refills | Status: AC | PRN

## 2016-06-06 NOTE — Telephone Encounter (Signed)
   Reason for call:   I received a call from [name] at 903 AM indicating she has had problems with nausea and a few episodes of vomiting over the past 5 or 6 days.   Pertinent Data:   She recently had an increase in her dose of oxycodone due to worsened pain and is currently planning for repeat cervical spine surgery on 06/16/16.  She denies any constipation or bloating and reports at least daily bowel movements.  She feels this nausea is very similar to what led to her ED visit in October where she was evaluated with RUQ US showing heterogenous liver echogenicity.  She has had a few episodes of vomiting with eating but is tolerating liquids well without as much trouble. The vomiting is limited and less than 3 episodes daily at the most.  She felt Zofran improved her nausea greatly in October after being evaluated in the ED.   Assessment / Plan / Recommendations:   It is unclear to me if her increased dose of opioids is worsening this nausea since it only loosely correlates with when she is taking the medicine. She tolerated 2/3 of this dose without similar problems in the past.  Today I will prescribe a short course of Zofran for her to try. She has a clinic appointment upcoming on the 15th which can hopefully better evaluate her symptoms or any abdominal abnormalities. A different medication choice may also help more if not improving.  If she does not get any relief I recommended she could also try reducing her oxycodone usage but she is concerned this would inadequately control her pain.  As always, pt is advised that if symptoms worsen or new symptoms arise, they should go to an urgent care facility or to to ER for further evaluation.   Collier Salina, MD   06/06/2016, 9:46 AM

## 2016-06-06 NOTE — Telephone Encounter (Signed)
Pt states medication continues to cause some nausea; text message sent to Dr Benjamine Mola.

## 2016-06-07 ENCOUNTER — Other Ambulatory Visit: Payer: Self-pay | Admitting: Internal Medicine

## 2016-06-07 DIAGNOSIS — G609 Hereditary and idiopathic neuropathy, unspecified: Secondary | ICD-10-CM | POA: Diagnosis not present

## 2016-06-07 DIAGNOSIS — I10 Essential (primary) hypertension: Secondary | ICD-10-CM

## 2016-06-08 DIAGNOSIS — G609 Hereditary and idiopathic neuropathy, unspecified: Secondary | ICD-10-CM | POA: Diagnosis not present

## 2016-06-09 DIAGNOSIS — G609 Hereditary and idiopathic neuropathy, unspecified: Secondary | ICD-10-CM | POA: Diagnosis not present

## 2016-06-09 NOTE — Pre-Procedure Instructions (Signed)
LISE KOVALCHIK  06/09/2016      [facility] - [address], [place] [address] [place] [address] Phone: [phone] Fax: [phone]    Your procedure is scheduled on June 16, 2016  Report to [facility] at 0730 A.M.  Call this number if you have problems the morning of surgery:  [phone]   Remember:  Do not eat food or drink liquids after midnight.  Take these medicines the morning of surgery with A SIP OF WATER Coreg, Flexeril, Eye drops, Gabapentin, Zofran, Oxycodone, Protonix.  7 days prior to surgery STOP taking any Aspirin, Aleve, Naproxen, Ibuprofen, Motrin, Advil, Goody's, BC's, all herbal medications, fish oil,  and all vitamins    How to Manage Your Diabetes Before and After Surgery  Why is it important to control my blood sugar before and after surgery? . Improving blood sugar levels before and after surgery helps healing and can limit problems. . A way of improving blood sugar control is eating a healthy diet by: o  Eating less sugar and carbohydrates o  Increasing activity/exercise o  Talking with your doctor about reaching your blood sugar goals . High blood sugars (greater than 180 mg/dL) can raise your risk of infections and slow your recovery, so you will need to focus on controlling your diabetes during the weeks before surgery. . Make sure that the doctor who takes care of your diabetes knows about your planned surgery including the date and location.  How do I manage my blood sugar before surgery? . Check your blood sugar at least 4 times a day, starting 2 days before surgery, to make sure that the level is not too high or low. o Check your blood sugar the morning of your surgery when you wake up and every 2 hours until you get to the Short Stay unit. . If your blood sugar is less than 70 mg/dL, you will need to treat for low blood sugar: o Do not  take insulin. o Treat a low blood sugar (less than 70 mg/dL) with  cup of clear juice (cranberry or apple), 4 glucose tablets, OR glucose gel. o Recheck blood sugar in 15 minutes after treatment (to make sure it is greater than 70 mg/dL). If your blood sugar is not greater than 70 mg/dL on recheck, call [phone] for further instructions. . Report your blood sugar to the short stay nurse when you get to Short Stay.  . If you are admitted to the hospital after surgery: o Your blood sugar will be checked by the staff and you will probably be given insulin after surgery (instead of oral diabetes medicines) to make sure you have good blood sugar levels. o The goal for blood sugar control after surgery is 80-180 mg/dL.            WHAT DO I DO ABOUT MY DIABETES MEDICATION?   Marland Kitchen Do not take oral diabetes medicines (pills) the morning of surgery.  . THE NIGHT BEFORE SURGERY, take ____25_______ units of ___Lantus________insulin.      . The day of surgery, do not take other diabetes injectables, including Byetta (exenatide), Bydureon (exenatide ER), Victoza (liraglutide), or Trulicity (dulaglutide).  . If your CBG is greater than 220 mg/dL, you may take  of your sliding scale (correction) dose of insulin.  Other Instructions:          Patient Signature:  Date:   Nurse Signature:  Date:   Reviewed and Endorsed by [facility] Patient Education Committee, August 2015  Do not wear jewelry, make-up or nail polish.  Do not wear lotions, powders, or perfumes, or deodorant.  Do not shave 48 hours prior to surgery.  Do not bring valuables to the hospital.  [facility] is not responsible for any belongings or valuables.  Contacts, dentures or bridgework may not be worn into surgery.  Leave your suitcase in the car.  After surgery it may be brought to your room.  For patients admitted to the hospital, discharge time will be determined by your treatment team.  Patients discharged the  day of surgery will not be allowed to drive home.   Name and phone number of your driver:    Special instructions:   [place]- Preparing For Surgery  Before surgery, you can play an important role. Because skin is not sterile, your skin needs to be as free of germs as possible. You can reduce the number of germs on your skin by washing with CHG (chlorahexidine gluconate) Soap before surgery.  CHG is an antiseptic cleaner which kills germs and bonds with the skin to continue killing germs even after washing.  Please do not use if you have an allergy to CHG or antibacterial soaps. If your skin becomes reddened/irritated stop using the CHG.  Do not shave (including legs and underarms) for at least 48 hours prior to first CHG shower. It is OK to shave your face.  Please follow these instructions carefully.   1. Shower the NIGHT BEFORE SURGERY and the MORNING OF SURGERY with CHG.   2. If you chose to wash your hair, wash your hair first as usual with your normal shampoo.  3. After you shampoo, rinse your hair and body thoroughly to remove the shampoo.  4. Use CHG as you would any other liquid soap. You can apply CHG directly to the skin and wash gently with a scrungie or a clean washcloth.   5. Apply the CHG Soap to your body ONLY FROM THE NECK DOWN.  Do not use on open wounds or open sores. Avoid contact with your eyes, ears, mouth and genitals (private parts). Wash genitals (private parts) with your normal soap.  6. Wash thoroughly, paying special attention to the area where your surgery will be performed.  7. Thoroughly rinse your body with warm water from the neck down.  8. DO NOT shower/wash with your normal soap after using and rinsing off the CHG Soap.  9. Pat yourself dry with a CLEAN TOWEL.   10. Wear CLEAN PAJAMAS   11. Place CLEAN SHEETS on your bed the night of your first shower and DO NOT SLEEP WITH PETS.    Day of Surgery: Do not apply any deodorants/lotions. Please  wear clean clothes to the hospital/surgery center.      Please read over the following fact sheets that you were given. Pain Booklet, Coughing and Deep Breathing, MRSA Information and Surgical Site Infection Prevention

## 2016-06-10 ENCOUNTER — Encounter: Payer: Self-pay | Admitting: Internal Medicine

## 2016-06-10 ENCOUNTER — Encounter (HOSPITAL_COMMUNITY): Payer: Self-pay

## 2016-06-10 ENCOUNTER — Encounter (HOSPITAL_COMMUNITY)
Admission: RE | Admit: 2016-06-10 | Discharge: 2016-06-10 | Disposition: A | Discharge status: Home / Self Care | Payer: Commercial Managed Care - HMO | Source: Ambulatory Visit | Attending: Neurological Surgery | Admitting: Neurological Surgery

## 2016-06-10 ENCOUNTER — Ambulatory Visit: Payer: Commercial Managed Care - HMO

## 2016-06-10 DIAGNOSIS — Z87891 Personal history of nicotine dependence: Secondary | ICD-10-CM | POA: Insufficient documentation

## 2016-06-10 DIAGNOSIS — K219 Gastro-esophageal reflux disease without esophagitis: Secondary | ICD-10-CM | POA: Insufficient documentation

## 2016-06-10 DIAGNOSIS — Z79899 Other long term (current) drug therapy: Secondary | ICD-10-CM | POA: Insufficient documentation

## 2016-06-10 DIAGNOSIS — I110 Hypertensive heart disease with heart failure: Secondary | ICD-10-CM | POA: Diagnosis not present

## 2016-06-10 DIAGNOSIS — Z01812 Encounter for preprocedural laboratory examination: Secondary | ICD-10-CM | POA: Insufficient documentation

## 2016-06-10 DIAGNOSIS — E119 Type 2 diabetes mellitus without complications: Secondary | ICD-10-CM | POA: Insufficient documentation

## 2016-06-10 DIAGNOSIS — Z794 Long term (current) use of insulin: Secondary | ICD-10-CM | POA: Diagnosis not present

## 2016-06-10 DIAGNOSIS — G609 Hereditary and idiopathic neuropathy, unspecified: Secondary | ICD-10-CM | POA: Diagnosis not present

## 2016-06-10 DIAGNOSIS — E785 Hyperlipidemia, unspecified: Secondary | ICD-10-CM | POA: Insufficient documentation

## 2016-06-10 DIAGNOSIS — M960 Pseudarthrosis after fusion or arthrodesis: Secondary | ICD-10-CM | POA: Insufficient documentation

## 2016-06-10 DIAGNOSIS — I509 Heart failure, unspecified: Secondary | ICD-10-CM | POA: Diagnosis not present

## 2016-06-10 DIAGNOSIS — Z01818 Encounter for other preprocedural examination: Secondary | ICD-10-CM | POA: Diagnosis not present

## 2016-06-10 DIAGNOSIS — Z7982 Long term (current) use of aspirin: Secondary | ICD-10-CM | POA: Diagnosis not present

## 2016-06-10 DIAGNOSIS — I2510 Atherosclerotic heart disease of native coronary artery without angina pectoris: Secondary | ICD-10-CM | POA: Diagnosis not present

## 2016-06-10 LAB — CBC WITH DIFFERENTIAL/PLATELET
BASOS ABS: 0 10*3/uL (ref 0.0–0.1)
BASOS PCT: 0 %
EOS ABS: 0.2 10*3/uL (ref 0.0–0.7)
EOS PCT: 1 %
HCT: 32.9 % — ABNORMAL LOW (ref 36.0–46.0)
Hemoglobin: 10.5 g/dL — ABNORMAL LOW (ref 12.0–15.0)
Lymphocytes Relative: 29 %
Lymphs Abs: 3.6 10*3/uL (ref 0.7–4.0)
MCH: 28 pg (ref 26.0–34.0)
MCHC: 31.9 g/dL (ref 30.0–36.0)
MCV: 87.7 fL (ref 78.0–100.0)
MONO ABS: 0.7 10*3/uL (ref 0.1–1.0)
MONOS PCT: 5 %
NEUTROS ABS: 8 10*3/uL — AB (ref 1.7–7.7)
Neutrophils Relative %: 65 %
PLATELETS: 281 10*3/uL (ref 150–400)
RBC: 3.75 MIL/uL — ABNORMAL LOW (ref 3.87–5.11)
RDW: 14.3 % (ref 11.5–15.5)
WBC: 12.5 10*3/uL — ABNORMAL HIGH (ref 4.0–10.5)

## 2016-06-10 LAB — SURGICAL PCR SCREEN
MRSA, PCR: NEGATIVE
STAPHYLOCOCCUS AUREUS: NEGATIVE

## 2016-06-10 LAB — BASIC METABOLIC PANEL
Anion gap: 9 (ref 5–15)
BUN: 19 mg/dL (ref 6–20)
CO2: 26 mmol/L (ref 22–32)
CREATININE: 1.09 mg/dL — AB (ref 0.44–1.00)
Calcium: 9.5 mg/dL (ref 8.9–10.3)
Chloride: 103 mmol/L (ref 101–111)
GFR calc Af Amer: >60 mL/min (ref >60)
GFR, EST NON AFRICAN AMERICAN: 55 mL/min — AB (ref >60)
Glucose, Bld: 146 mg/dL — ABNORMAL HIGH (ref 65–99)
POTASSIUM: 4.2 mmol/L (ref 3.5–5.1)
SODIUM: 138 mmol/L (ref 135–145)

## 2016-06-10 LAB — PROTIME-INR
INR: 0.97
PROTHROMBIN TIME: 12.8 s (ref 11.4–15.2)

## 2016-06-10 LAB — GLUCOSE, CAPILLARY: GLUCOSE-CAPILLARY: 161 mg/dL — AB (ref 65–99)

## 2016-06-10 NOTE — Progress Notes (Signed)
   06/10/16 0837  OBSTRUCTIVE SLEEP APNEA  Have you ever been diagnosed with sleep apnea through a sleep study? No  Do you snore loudly (loud enough to be heard through closed doors)?  1  Do you often feel tired, fatigued, or sleepy during the daytime (such as falling asleep during driving or talking to someone)? 0  Has anyone observed you stop breathing during your sleep? 0  Do you have, or are you being treated for high blood pressure? 1  BMI more than 35 kg/m2? 1  Age > 50 (1-yes) 1  Neck circumference greater than:Female 16 inches or larger, Female 17inches or larger? 1  Female Gender (Yes=1) 0  Obstructive Sleep Apnea Score 5  Score 5 or greater  Results sent to PCP

## 2016-06-10 NOTE — Progress Notes (Signed)
PCP Dr. Vernelle Emerald Cardiologist Dr. Sallyanne [facility], last echo 05/01/15; stress test 05/01/15; cardiac cath 01/24/12. Denies any chest pain, shortness of breath during PAT visit. States normal fasting glucose ranges from 110-125

## 2016-06-11 DIAGNOSIS — G609 Hereditary and idiopathic neuropathy, unspecified: Secondary | ICD-10-CM | POA: Diagnosis not present

## 2016-06-12 DIAGNOSIS — G609 Hereditary and idiopathic neuropathy, unspecified: Secondary | ICD-10-CM | POA: Diagnosis not present

## 2016-06-13 DIAGNOSIS — G609 Hereditary and idiopathic neuropathy, unspecified: Secondary | ICD-10-CM | POA: Diagnosis not present

## 2016-06-14 DIAGNOSIS — G609 Hereditary and idiopathic neuropathy, unspecified: Secondary | ICD-10-CM | POA: Diagnosis not present

## 2016-06-14 NOTE — Progress Notes (Signed)
Anesthesia Chart Review:  Pt is a 55 year old female scheduled for C6-7 ACDF on 06/16/2016 with Sherley Bounds, MD.   Pt receives primary care at the [facility]. Cardiologist is Dr. Burt Knack, last office visit 05/29/15 with Lyda Jester, PA; follow up in 1 year recommended.   PMH includes:  CAD (mild), CHF, HTN, DM, hyperlipidemia, GERD. Former smoker. BMI 42. S/p cervical spine fusion 10/22/15.   Medications include: ASA, carvedilol, cosopt ophthalmic, iron, lasix, novolog, lantus, lisinopril, metformin, protonix, pravastatin  Preoperative labs reviewed.  Glucose 146. HgbA1c was 9.0 on 05/27/16.   Chest x-ray 04/30/15 reviewed. No active cardiopulmonary disease.   EKG 05/29/15: NSR. Nonspecific T wave abnormality. Prolonged QT  Echo 05/01/15:  - Left ventricle: The cavity size was normal. Wall thickness was increased in a pattern of mild LVH. Systolic function was normal. The estimated ejection fraction was in the range of 55% to 60%. Wall motion was normal; there were no regional wall motion abnormalities. Doppler parameters are consistent with abnormal left ventricular relaxation (grade 1 diastolic dysfunction).  Nuclear stress test 05/01/15:  1. No evidence of inducible myocardial ischemia. Inferior wall attenuation likely relates to diaphragmatic attenuation. Apical attenuation may relate to apical thinning or breast soft tissue attenuation. 2. Normal left ventricular wall motion. 3. Left ventricular ejection fraction 53% 4. Low-risk stress test findings  Carotid duplex 09/07/12: No significant extracranial carotid artery stenosis demonstrated  Cardiac cath 01/24/12:  1. Minor coronary irregularities.  There is a 50% stenosis in the mid LAD that may be associated with myocardial bridging.  It does not appear to obstruct flow. 2. Moderate LV dysfunction with EF of 40-45%  If no changes, I anticipate pt can proceed with surgery as scheduled.   Willeen Cass,  FNP-BC [name] Short Stay Surgical Center/Anesthesiology Phone: [phone] 06/14/2016 2:04 PM

## 2016-06-15 DIAGNOSIS — G609 Hereditary and idiopathic neuropathy, unspecified: Secondary | ICD-10-CM | POA: Diagnosis not present

## 2016-06-15 NOTE — Progress Notes (Signed)
Called and informed of time change. Informed pt to arrive at 0930 for surgery at 1130 teach back complete.

## 2016-06-16 ENCOUNTER — Encounter (HOSPITAL_COMMUNITY): Payer: Self-pay | Admitting: *Deleted

## 2016-06-16 ENCOUNTER — Inpatient Hospital Stay (HOSPITAL_COMMUNITY): Payer: Commercial Managed Care - HMO | Admitting: Emergency Medicine

## 2016-06-16 ENCOUNTER — Inpatient Hospital Stay (HOSPITAL_COMMUNITY): Payer: Commercial Managed Care - HMO | Admitting: Certified Registered Nurse Anesthetist

## 2016-06-16 ENCOUNTER — Inpatient Hospital Stay (HOSPITAL_COMMUNITY): Payer: Commercial Managed Care - HMO

## 2016-06-16 ENCOUNTER — Ambulatory Visit (HOSPITAL_COMMUNITY)
Admission: RE | Admit: 2016-06-16 | Discharge: 2016-06-17 | Disposition: A | Discharge status: Home / Self Care | Payer: Commercial Managed Care - HMO | Source: Ambulatory Visit | Attending: Neurological Surgery | Admitting: Neurological Surgery

## 2016-06-16 ENCOUNTER — Encounter (HOSPITAL_COMMUNITY)
Admission: RE | Disposition: A | Discharge status: Home / Self Care | Payer: Self-pay | Source: Ambulatory Visit | Attending: Neurological Surgery

## 2016-06-16 DIAGNOSIS — E1142 Type 2 diabetes mellitus with diabetic polyneuropathy: Secondary | ICD-10-CM | POA: Diagnosis not present

## 2016-06-16 DIAGNOSIS — S129XXA Fracture of neck, unspecified, initial encounter: Secondary | ICD-10-CM

## 2016-06-16 DIAGNOSIS — M960 Pseudarthrosis after fusion or arthrodesis: Secondary | ICD-10-CM | POA: Diagnosis not present

## 2016-06-16 DIAGNOSIS — E785 Hyperlipidemia, unspecified: Secondary | ICD-10-CM | POA: Insufficient documentation

## 2016-06-16 DIAGNOSIS — I110 Hypertensive heart disease with heart failure: Secondary | ICD-10-CM | POA: Insufficient documentation

## 2016-06-16 DIAGNOSIS — I2510 Atherosclerotic heart disease of native coronary artery without angina pectoris: Secondary | ICD-10-CM | POA: Insufficient documentation

## 2016-06-16 DIAGNOSIS — K219 Gastro-esophageal reflux disease without esophagitis: Secondary | ICD-10-CM | POA: Diagnosis not present

## 2016-06-16 DIAGNOSIS — M4802 Spinal stenosis, cervical region: Secondary | ICD-10-CM | POA: Diagnosis not present

## 2016-06-16 DIAGNOSIS — Y793 Surgical instruments, materials and orthopedic devices (including sutures) associated with adverse incidents: Secondary | ICD-10-CM | POA: Diagnosis not present

## 2016-06-16 DIAGNOSIS — Z7982 Long term (current) use of aspirin: Secondary | ICD-10-CM | POA: Diagnosis not present

## 2016-06-16 DIAGNOSIS — M542 Cervicalgia: Secondary | ICD-10-CM | POA: Diagnosis not present

## 2016-06-16 DIAGNOSIS — Y838 Other surgical procedures as the cause of abnormal reaction of the patient, or of later complication, without mention of misadventure at the time of the procedure: Secondary | ICD-10-CM | POA: Diagnosis not present

## 2016-06-16 DIAGNOSIS — Z981 Arthrodesis status: Secondary | ICD-10-CM | POA: Diagnosis not present

## 2016-06-16 DIAGNOSIS — I509 Heart failure, unspecified: Secondary | ICD-10-CM | POA: Insufficient documentation

## 2016-06-16 DIAGNOSIS — M545 Low back pain: Secondary | ICD-10-CM | POA: Diagnosis not present

## 2016-06-16 DIAGNOSIS — E11319 Type 2 diabetes mellitus with unspecified diabetic retinopathy without macular edema: Secondary | ICD-10-CM | POA: Diagnosis not present

## 2016-06-16 DIAGNOSIS — Z6841 Body Mass Index (BMI) 40.0 and over, adult: Secondary | ICD-10-CM | POA: Diagnosis not present

## 2016-06-16 DIAGNOSIS — Z87891 Personal history of nicotine dependence: Secondary | ICD-10-CM | POA: Insufficient documentation

## 2016-06-16 DIAGNOSIS — Z794 Long term (current) use of insulin: Secondary | ICD-10-CM | POA: Insufficient documentation

## 2016-06-16 HISTORY — PX: ANTERIOR CERVICAL DECOMP/DISCECTOMY FUSION: SHX1161

## 2016-06-16 LAB — GLUCOSE, CAPILLARY
GLUCOSE-CAPILLARY: 165 mg/dL — AB (ref 65–99)
GLUCOSE-CAPILLARY: 130 mg/dL — AB (ref 65–99)
GLUCOSE-CAPILLARY: 174 mg/dL — AB (ref 65–99)
GLUCOSE-CAPILLARY: 328 mg/dL — AB (ref 65–99)

## 2016-06-16 SURGERY — ANTERIOR CERVICAL DECOMPRESSION/DISCECTOMY FUSION 1 LEVEL
Anesthesia: General | Site: Spine Cervical

## 2016-06-16 MED ORDER — PHENYLEPHRINE 40 MCG/ML (10ML) SYRINGE FOR IV PUSH (FOR BLOOD PRESSURE SUPPORT)
PREFILLED_SYRINGE | INTRAVENOUS | Status: AC
  Filled 2016-06-16: qty 10

## 2016-06-16 MED ORDER — HYDROMORPHONE HCL 1 MG/ML IJ SOLN
0.2500 mg | INTRAMUSCULAR | Status: DC | PRN
  Administered 2016-06-16: 0.5 mg via INTRAVENOUS

## 2016-06-16 MED ORDER — PHENOL 1.4 % MT LIQD: 1.0000 | OROMUCOSAL | Status: DC | PRN

## 2016-06-16 MED ORDER — CEFAZOLIN SODIUM-DEXTROSE 2-4 GM/100ML-% IV SOLN
2.0000 g | INTRAVENOUS | Status: AC
  Administered 2016-06-16: 2 g via INTRAVENOUS
  Filled 2016-06-16: qty 100

## 2016-06-16 MED ORDER — LACTATED RINGERS IV SOLN
INTRAVENOUS | Status: DC
  Administered 2016-06-16 (×3): via INTRAVENOUS

## 2016-06-16 MED ORDER — FENTANYL CITRATE (PF) 100 MCG/2ML IJ SOLN
INTRAMUSCULAR | Status: DC | PRN
  Administered 2016-06-16 (×4): 50 ug via INTRAVENOUS

## 2016-06-16 MED ORDER — EPHEDRINE SULFATE 50 MG/ML IJ SOLN
INTRAMUSCULAR | Status: DC | PRN
  Administered 2016-06-16: 10 mg via INTRAVENOUS

## 2016-06-16 MED ORDER — ONDANSETRON HCL 4 MG/2ML IJ SOLN
INTRAMUSCULAR | Status: DC | PRN
  Administered 2016-06-16: 4 mg via INTRAVENOUS

## 2016-06-16 MED ORDER — AMITRIPTYLINE HCL 25 MG PO TABS
25.0000 mg | ORAL_TABLET | Freq: Every day | ORAL | Status: DC
  Administered 2016-06-16: 25 mg via ORAL
  Filled 2016-06-16: qty 1

## 2016-06-16 MED ORDER — CARVEDILOL 6.25 MG PO TABS
6.2500 mg | ORAL_TABLET | Freq: Two times a day (BID) | ORAL | Status: DC
  Administered 2016-06-16: 6.25 mg via ORAL
  Filled 2016-06-16 (×2): qty 1

## 2016-06-16 MED ORDER — DEXTROSE 5 % IV SOLN
INTRAVENOUS | Status: DC | PRN
  Administered 2016-06-16: 40 ug/min via INTRAVENOUS

## 2016-06-16 MED ORDER — MENTHOL 3 MG MT LOZG: 1.0000 | LOZENGE | OROMUCOSAL | Status: DC | PRN

## 2016-06-16 MED ORDER — ARTIFICIAL TEARS OP OINT
TOPICAL_OINTMENT | OPHTHALMIC | Status: AC
  Filled 2016-06-16: qty 3.5

## 2016-06-16 MED ORDER — LISINOPRIL 20 MG PO TABS
10.0000 mg | ORAL_TABLET | Freq: Every day | ORAL | Status: DC
  Administered 2016-06-16: 10 mg via ORAL
  Filled 2016-06-16 (×2): qty 1

## 2016-06-16 MED ORDER — DORZOLAMIDE HCL-TIMOLOL MAL 2-0.5 % OP SOLN
1.0000 [drp] | Freq: Two times a day (BID) | OPHTHALMIC | Status: DC
  Filled 2016-06-16: qty 10

## 2016-06-16 MED ORDER — MIDAZOLAM HCL 5 MG/5ML IJ SOLN
INTRAMUSCULAR | Status: DC | PRN
  Administered 2016-06-16: 2 mg via INTRAVENOUS

## 2016-06-16 MED ORDER — ACETAMINOPHEN 650 MG RE SUPP: 650.0000 mg | RECTAL | Status: DC | PRN

## 2016-06-16 MED ORDER — METFORMIN HCL 500 MG PO TABS: 1000.0000 mg | ORAL_TABLET | Freq: Every day | ORAL | Status: DC

## 2016-06-16 MED ORDER — LIDOCAINE 2% (20 MG/ML) 5 ML SYRINGE
INTRAMUSCULAR | Status: AC
  Filled 2016-06-16: qty 5

## 2016-06-16 MED ORDER — DORZOLAMIDE HCL 2 % OP SOLN
1.0000 [drp] | Freq: Two times a day (BID) | OPHTHALMIC | Status: DC
  Administered 2016-06-16: 1 [drp] via OPHTHALMIC
  Filled 2016-06-16: qty 10

## 2016-06-16 MED ORDER — DEXAMETHASONE SODIUM PHOSPHATE 10 MG/ML IJ SOLN
INTRAMUSCULAR | Status: AC
  Filled 2016-06-16: qty 2

## 2016-06-16 MED ORDER — ROCURONIUM BROMIDE 100 MG/10ML IV SOLN
INTRAVENOUS | Status: DC | PRN
  Administered 2016-06-16: 10 mg via INTRAVENOUS
  Administered 2016-06-16: 40 mg via INTRAVENOUS

## 2016-06-16 MED ORDER — SODIUM CHLORIDE 0.9% FLUSH: 3.0000 mL | INTRAVENOUS | Status: DC | PRN

## 2016-06-16 MED ORDER — ONDANSETRON HCL 4 MG/2ML IJ SOLN
INTRAMUSCULAR | Status: AC
  Filled 2016-06-16: qty 2

## 2016-06-16 MED ORDER — DEXAMETHASONE SODIUM PHOSPHATE 10 MG/ML IJ SOLN
10.0000 mg | INTRAMUSCULAR | Status: AC
  Administered 2016-06-16: 10 mg via INTRAVENOUS
  Filled 2016-06-16: qty 1

## 2016-06-16 MED ORDER — PROPOFOL 10 MG/ML IV BOLUS
INTRAVENOUS | Status: AC
  Filled 2016-06-16: qty 40

## 2016-06-16 MED ORDER — GABAPENTIN 300 MG PO CAPS
300.0000 mg | ORAL_CAPSULE | Freq: Three times a day (TID) | ORAL | Status: DC
  Administered 2016-06-16: 300 mg via ORAL
  Filled 2016-06-16 (×2): qty 1

## 2016-06-16 MED ORDER — CEFAZOLIN IN D5W 1 GM/50ML IV SOLN
1.0000 g | Freq: Three times a day (TID) | INTRAVENOUS | Status: AC
  Administered 2016-06-16 – 2016-06-17 (×2): 1 g via INTRAVENOUS
  Filled 2016-06-16 (×2): qty 50

## 2016-06-16 MED ORDER — SUCCINYLCHOLINE CHLORIDE 200 MG/10ML IV SOSY
PREFILLED_SYRINGE | INTRAVENOUS | Status: AC
  Filled 2016-06-16: qty 10

## 2016-06-16 MED ORDER — THROMBIN 5000 UNITS EX SOLR
CUTANEOUS | Status: DC | PRN
  Administered 2016-06-16: 5000 [IU] via TOPICAL

## 2016-06-16 MED ORDER — THROMBIN 5000 UNITS EX SOLR
CUTANEOUS | Status: AC
  Filled 2016-06-16: qty 15000

## 2016-06-16 MED ORDER — SUCCINYLCHOLINE CHLORIDE 20 MG/ML IJ SOLN
INTRAMUSCULAR | Status: DC | PRN
  Administered 2016-06-16: 120 mg via INTRAVENOUS

## 2016-06-16 MED ORDER — PROPOFOL 10 MG/ML IV BOLUS
INTRAVENOUS | Status: DC | PRN
  Administered 2016-06-16: 200 mg via INTRAVENOUS

## 2016-06-16 MED ORDER — OXYCODONE HCL 5 MG/5ML PO SOLN
ORAL | Status: AC
  Filled 2016-06-16: qty 5

## 2016-06-16 MED ORDER — LATANOPROST 0.005 % OP SOLN
1.0000 [drp] | Freq: Every day | OPHTHALMIC | Status: DC
  Administered 2016-06-16: 1 [drp] via OPHTHALMIC
  Filled 2016-06-16: qty 2.5

## 2016-06-16 MED ORDER — HYDROMORPHONE HCL 1 MG/ML IJ SOLN
INTRAMUSCULAR | Status: AC
  Filled 2016-06-16: qty 1

## 2016-06-16 MED ORDER — THROMBIN 5000 UNITS EX SOLR
OROMUCOSAL | Status: DC | PRN
  Administered 2016-06-16: 12:00:00 via TOPICAL

## 2016-06-16 MED ORDER — CHLORHEXIDINE GLUCONATE CLOTH 2 % EX PADS: 6.0000 | MEDICATED_PAD | Freq: Once | CUTANEOUS | Status: DC

## 2016-06-16 MED ORDER — FENTANYL CITRATE (PF) 100 MCG/2ML IJ SOLN
INTRAMUSCULAR | Status: AC
  Filled 2016-06-16: qty 4

## 2016-06-16 MED ORDER — INSULIN ASPART 100 UNIT/ML ~~LOC~~ SOLN
0.0000 [IU] | Freq: Three times a day (TID) | SUBCUTANEOUS | Status: DC
  Administered 2016-06-17: 11 [IU] via SUBCUTANEOUS

## 2016-06-16 MED ORDER — MIDAZOLAM HCL 2 MG/2ML IJ SOLN
INTRAMUSCULAR | Status: AC
  Filled 2016-06-16: qty 2

## 2016-06-16 MED ORDER — BUPIVACAINE HCL (PF) 0.25 % IJ SOLN
INTRAMUSCULAR | Status: AC
  Filled 2016-06-16: qty 30

## 2016-06-16 MED ORDER — FUROSEMIDE 20 MG PO TABS
20.0000 mg | ORAL_TABLET | Freq: Every day | ORAL | Status: DC
  Administered 2016-06-16: 20 mg via ORAL
  Filled 2016-06-16 (×2): qty 1

## 2016-06-16 MED ORDER — PANTOPRAZOLE SODIUM 40 MG PO TBEC
40.0000 mg | DELAYED_RELEASE_TABLET | Freq: Every day | ORAL | Status: DC
  Administered 2016-06-16: 40 mg via ORAL
  Filled 2016-06-16 (×2): qty 1

## 2016-06-16 MED ORDER — TIMOLOL MALEATE 0.5 % OP SOLN
1.0000 [drp] | Freq: Two times a day (BID) | OPHTHALMIC | Status: DC
  Administered 2016-06-16: 1 [drp] via OPHTHALMIC
  Filled 2016-06-16: qty 5

## 2016-06-16 MED ORDER — ROCURONIUM BROMIDE 50 MG/5ML IV SOSY
PREFILLED_SYRINGE | INTRAVENOUS | Status: AC
  Filled 2016-06-16: qty 5

## 2016-06-16 MED ORDER — MORPHINE SULFATE (PF) 4 MG/ML IV SOLN
1.0000 mg | INTRAVENOUS | Status: DC | PRN
  Administered 2016-06-16: 4 mg via INTRAVENOUS
  Filled 2016-06-16: qty 1

## 2016-06-16 MED ORDER — SODIUM CHLORIDE 0.9 % IR SOLN
Status: DC | PRN
  Administered 2016-06-16: 12:00:00

## 2016-06-16 MED ORDER — SODIUM CHLORIDE 0.9% FLUSH
3.0000 mL | Freq: Two times a day (BID) | INTRAVENOUS | Status: DC
  Administered 2016-06-16: 3 mL via INTRAVENOUS

## 2016-06-16 MED ORDER — BUPIVACAINE HCL (PF) 0.25 % IJ SOLN
INTRAMUSCULAR | Status: DC | PRN
  Administered 2016-06-16: 5 mL via PERINEURAL

## 2016-06-16 MED ORDER — METFORMIN HCL 500 MG PO TABS: 1000.0000 mg | ORAL_TABLET | Freq: Two times a day (BID) | ORAL | Status: DC

## 2016-06-16 MED ORDER — SUGAMMADEX SODIUM 200 MG/2ML IV SOLN
INTRAVENOUS | Status: DC | PRN
  Administered 2016-06-16: 300 mg via INTRAVENOUS

## 2016-06-16 MED ORDER — EPHEDRINE 5 MG/ML INJ
INTRAVENOUS | Status: AC
  Filled 2016-06-16: qty 10

## 2016-06-16 MED ORDER — ONDANSETRON HCL 4 MG/2ML IJ SOLN: 4.0000 mg | INTRAMUSCULAR | Status: DC | PRN

## 2016-06-16 MED ORDER — ACETAMINOPHEN 325 MG PO TABS: 650.0000 mg | ORAL_TABLET | ORAL | Status: DC | PRN

## 2016-06-16 MED ORDER — 0.9 % SODIUM CHLORIDE (POUR BTL) OPTIME
TOPICAL | Status: DC | PRN
  Administered 2016-06-16: 1000 mL

## 2016-06-16 MED ORDER — INSULIN ASPART 100 UNIT/ML ~~LOC~~ SOLN
0.0000 [IU] | Freq: Every day | SUBCUTANEOUS | Status: DC
  Administered 2016-06-16: 4 [IU] via SUBCUTANEOUS

## 2016-06-16 MED ORDER — OXYCODONE HCL 5 MG PO TABS
5.0000 mg | ORAL_TABLET | ORAL | Status: DC | PRN
  Administered 2016-06-16 – 2016-06-17 (×4): 5 mg via ORAL
  Filled 2016-06-16 (×3): qty 1

## 2016-06-16 MED ORDER — CYCLOBENZAPRINE HCL 10 MG PO TABS
10.0000 mg | ORAL_TABLET | Freq: Three times a day (TID) | ORAL | Status: DC | PRN
  Administered 2016-06-16 (×2): 10 mg via ORAL
  Filled 2016-06-16: qty 1

## 2016-06-16 MED ORDER — METFORMIN HCL 500 MG PO TABS
1000.0000 mg | ORAL_TABLET | Freq: Two times a day (BID) | ORAL | Status: DC
  Administered 2016-06-16: 1000 mg via ORAL
  Filled 2016-06-16 (×2): qty 2

## 2016-06-16 MED ORDER — LIDOCAINE HCL (CARDIAC) 20 MG/ML IV SOLN
INTRAVENOUS | Status: DC | PRN
Start: 2016-06-16 — End: 2016-06-16
  Administered 2016-06-16: 100 mg via INTRAVENOUS

## 2016-06-16 MED ORDER — SODIUM CHLORIDE 0.9 % IV SOLN: 250.0000 mL | INTRAVENOUS | Status: DC

## 2016-06-16 MED ORDER — ARTIFICIAL TEARS OP OINT
TOPICAL_OINTMENT | OPHTHALMIC | Status: DC | PRN
  Administered 2016-06-16: 1 via OPHTHALMIC

## 2016-06-16 MED ORDER — CYCLOBENZAPRINE HCL 10 MG PO TABS
ORAL_TABLET | ORAL | Status: AC
  Filled 2016-06-16: qty 1

## 2016-06-16 MED ORDER — POTASSIUM CHLORIDE IN NACL 20-0.9 MEQ/L-% IV SOLN: INTRAVENOUS | Status: DC

## 2016-06-16 MED ORDER — PROMETHAZINE HCL 25 MG/ML IJ SOLN: 6.2500 mg | INTRAMUSCULAR | Status: DC | PRN

## 2016-06-16 MED ORDER — HEMOSTATIC AGENTS (NO CHARGE) OPTIME
TOPICAL | Status: DC | PRN
  Administered 2016-06-16: 1 via TOPICAL

## 2016-06-16 MED ORDER — PHENYLEPHRINE HCL 10 MG/ML IJ SOLN
INTRAMUSCULAR | Status: DC | PRN
  Administered 2016-06-16: 80 ug via INTRAVENOUS
  Administered 2016-06-16: 120 ug via INTRAVENOUS
  Administered 2016-06-16: 40 ug via INTRAVENOUS

## 2016-06-16 SURGICAL SUPPLY — 49 items
BAG DECANTER FOR FLEXI CONT (MISCELLANEOUS) ×2 IMPLANT
BASKET BONE COLLECTION (BASKET) ×2 IMPLANT
BENZOIN TINCTURE PRP APPL 2/3 (GAUZE/BANDAGES/DRESSINGS) ×2 IMPLANT
BIT DRILL POWER (BIT) ×1 IMPLANT
BUR MATCHSTICK NEURO 3.0 LAGG (BURR) ×2 IMPLANT
CAGE CERVICAL 12X14X7 7D (Cage) ×2 IMPLANT
CANISTER SUCT 3000ML PPV (MISCELLANEOUS) ×2 IMPLANT
CARTRIDGE OIL MAESTRO DRILL (MISCELLANEOUS) ×1 IMPLANT
DIFFUSER DRILL AIR PNEUMATIC (MISCELLANEOUS) ×2 IMPLANT
DRAPE C-ARM 42X72 X-RAY (DRAPES) ×4 IMPLANT
DRAPE LAPAROTOMY 100X72 PEDS (DRAPES) ×2 IMPLANT
DRAPE MICROSCOPE LEICA (MISCELLANEOUS) ×2 IMPLANT
DRAPE POUCH INSTRU U-SHP 10X18 (DRAPES) ×2 IMPLANT
DRILL BIT POWER (BIT) ×1
DRSG OPSITE POSTOP 3X4 (GAUZE/BANDAGES/DRESSINGS) ×2 IMPLANT
DURAPREP 6ML APPLICATOR 50/CS (WOUND CARE) ×2 IMPLANT
ELECT COATED BLADE 2.86 ST (ELECTRODE) ×2 IMPLANT
ELECT REM PT RETURN 9FT ADLT (ELECTROSURGICAL) ×2
ELECTRODE REM PT RTRN 9FT ADLT (ELECTROSURGICAL) ×1 IMPLANT
GAUZE SPONGE 4X4 16PLY XRAY LF (GAUZE/BANDAGES/DRESSINGS) IMPLANT
GLOVE BIO SURGEON STRL SZ8 (GLOVE) ×2 IMPLANT
GLOVE BIOGEL PI IND STRL 7.5 (GLOVE) ×1 IMPLANT
GLOVE BIOGEL PI INDICATOR 7.5 (GLOVE) ×1
GLOVE SURG SS PI 7.0 STRL IVOR (GLOVE) ×2 IMPLANT
GOWN STRL REUS W/ TWL LRG LVL3 (GOWN DISPOSABLE) ×2 IMPLANT
GOWN STRL REUS W/ TWL XL LVL3 (GOWN DISPOSABLE) IMPLANT
GOWN STRL REUS W/TWL 2XL LVL3 (GOWN DISPOSABLE) ×2 IMPLANT
GOWN STRL REUS W/TWL LRG LVL3 (GOWN DISPOSABLE) ×2
GOWN STRL REUS W/TWL XL LVL3 (GOWN DISPOSABLE)
HEMOSTAT POWDER KIT SURGIFOAM (HEMOSTASIS) ×2 IMPLANT
KIT BASIN OR (CUSTOM PROCEDURE TRAY) ×2 IMPLANT
KIT ROOM TURNOVER OR (KITS) ×2 IMPLANT
NEEDLE HYPO 25X1 1.5 SAFETY (NEEDLE) ×2 IMPLANT
NEEDLE SPNL 20GX3.5 QUINCKE YW (NEEDLE) ×2 IMPLANT
NS IRRIG 1000ML POUR BTL (IV SOLUTION) ×2 IMPLANT
OIL CARTRIDGE MAESTRO DRILL (MISCELLANEOUS) ×2
PACK LAMINECTOMY NEURO (CUSTOM PROCEDURE TRAY) ×2 IMPLANT
PAD ARMBOARD 7.5X6 YLW CONV (MISCELLANEOUS) ×2 IMPLANT
PIN DISTRACTION 14MM (PIN) ×4 IMPLANT
PLATE ARCHON 1-LEVEL 22MM (Plate) ×2 IMPLANT
RUBBERBAND STERILE (MISCELLANEOUS) ×4 IMPLANT
SCREW ARCHON SELFTAP 4.0X13 (Screw) ×8 IMPLANT
SPONGE INTESTINAL PEANUT (DISPOSABLE) ×2 IMPLANT
SPONGE SURGIFOAM ABS GEL SZ50 (HEMOSTASIS) ×2 IMPLANT
STRIP CLOSURE SKIN 1/2X4 (GAUZE/BANDAGES/DRESSINGS) ×2 IMPLANT
SUT VIC AB 3-0 SH 8-18 (SUTURE) ×2 IMPLANT
TOWEL OR 17X24 6PK STRL BLUE (TOWEL DISPOSABLE) ×2 IMPLANT
TOWEL OR 17X26 10 PK STRL BLUE (TOWEL DISPOSABLE) ×2 IMPLANT
WATER STERILE IRR 1000ML POUR (IV SOLUTION) ×2 IMPLANT

## 2016-06-16 NOTE — Anesthesia Postprocedure Evaluation (Signed)
Anesthesia Post Note  Patient: MAKENLY MALLEK  Procedure(s) Performed: Procedure(s) (LRB): Anterior Cervical Decompression/discectomy Fusion - Cervical six - Cervical seven (N/A)  Patient location during evaluation: PACU Anesthesia Type: General Level of consciousness: awake and alert Pain management: pain level controlled (controlled somewhat) Vital Signs Assessment: post-procedure vital signs reviewed and stable Respiratory status: spontaneous breathing, nonlabored ventilation, respiratory function stable and patient connected to nasal cannula oxygen Cardiovascular status: blood pressure returned to baseline and stable Postop Assessment: no signs of nausea or vomiting Anesthetic complications: no       Last Vitals:  Vitals:   06/16/16 1430 06/16/16 1445  BP: (!) 159/89   Pulse: (!) 107 (!) 104  Resp: 15 19  Temp:      Last Pain:  Vitals:   06/16/16 1445  TempSrc:   PainSc: 8                  Lita Flynn,JAMES TERRILL

## 2016-06-16 NOTE — Anesthesia Preprocedure Evaluation (Addendum)
Anesthesia Evaluation  Patient identified by MRN, date of birth, ID band Patient awake    Reviewed: Allergy & Precautions, NPO status , Patient's Chart, lab work & pertinent test results  Airway Mallampati: II  TM Distance: >3 FB Neck ROM: Limited    Dental  (+) Teeth Intact, Dental Advisory Given, Loose, Poor Dentition,    Pulmonary former smoker,     + decreased breath sounds      Cardiovascular hypertension, + CAD   Rhythm:Regular Rate:Normal     Neuro/Psych    GI/Hepatic GERD  ,  Endo/Other  diabetesMorbid obesity  Renal/GU      Musculoskeletal  (+) Arthritis ,   Abdominal   Peds  Hematology   Anesthesia Other Findings   Reproductive/Obstetrics                           Anesthesia Physical Anesthesia Plan  ASA: III  Anesthesia Plan: General   Post-op Pain Management:    Induction:   Airway Management Planned: Oral ETT and Video Laryngoscope Planned  Additional Equipment:   Intra-op Plan:   Post-operative Plan: Extubation in OR and Possible Post-op intubation/ventilation  Informed Consent: I have reviewed the patients History and Physical, chart, labs and discussed the procedure including the risks, benefits and alternatives for the proposed anesthesia with the patient or authorized representative who has indicated his/her understanding and acceptance.   Dental advisory given  Plan Discussed with:   Anesthesia Plan Comments:         Anesthesia Quick Evaluation

## 2016-06-16 NOTE — Anesthesia Procedure Notes (Signed)
Procedure Name: Intubation Date/Time: 06/16/2016 11:41 AM Performed by: Scheryl Darter Pre-anesthesia Checklist: Patient identified, Emergency Drugs available, Suction available and Patient being monitored Patient Re-evaluated:Patient Re-evaluated prior to inductionOxygen Delivery Method: Circle System Utilized Preoxygenation: Pre-oxygenation with 100% oxygen Intubation Type: IV induction Ventilation: Mask ventilation without difficulty Laryngoscope Size: McGraph and 4 Grade View: Grade III Tube type: Oral Tube size: 7.0 mm Number of attempts: 1 Airway Equipment and Method: Stylet and Oral airway Placement Confirmation: ETT inserted through vocal cords under direct vision,  positive ETCO2 and breath sounds checked- equal and bilateral Secured at: 23 cm Tube secured with: Tape Dental Injury: Teeth and Oropharynx as per pre-operative assessment  Difficulty Due To: Difficulty was anticipated Comments: Loose teeth intact post induction and intubation

## 2016-06-16 NOTE — Op Note (Signed)
06/16/2016  1:04 PM  PATIENT:  [name]  55 y.o. female  PRE-OPERATIVE DIAGNOSIS:  Pseudoarthrosis C6-7  POST-OPERATIVE DIAGNOSIS:  Same  PROCEDURE:  1. Anterior cervical arthrodesis C6-7 utilizing a peek interbody cage packed with morcellized autograft, 2. Anterior cervical plating C6-7 utilizing a nuvasive archon plate  SURGEON:  Sherley Bounds, MD  ASSISTANTS: Dr. Cyndy Freeze  ANESTHESIA:   General  EBL: Less than 25 ml  No intake/output data recorded.  BLOOD ADMINISTERED:none  DRAINS: None   SPECIMEN:  No Specimen  INDICATION FOR PROCEDURE: This patient underwent a previous posterior cervical fusion from C3-C7. She presented with neck pain. There was a lucency around the C7 screws and a pseudoarthrosis at C6-7. She tried medical management without relief. I recommended ACDF with plating. Her pain was debilitating. Patient understood the risks, benefits, and alternatives and potential outcomes and wished to proceed.  PROCEDURE DETAILS: Patient was brought to the operating room placed under general endotracheal anesthesia. Patient was placed in the supine position on the operating room table. The neck was prepped with Duraprep and draped in a sterile fashion.   Three cc of local anesthesia was injected and a transverse incision was made on the right side of the neck.  Dissection was carried down thru the subcutaneous tissue and the platysma was  elevated, opened, and undermined with Metzenbaum scissors.  Dissection was then carried out thru an avascular plane leaving the sternocleidomastoid carotid artery and jugular vein laterally and the trachea and esophagus medially. The ventral aspect of the vertebral column was identified and a localizing x-ray was taken. The C6-7 level was identified. The longus colli muscles were then elevated and the retractor was placed. The annulus was incised and the disc space entered. Discectomy was performed with micro-curettes and pituitary rongeurs. I  then used the high-speed drill to drill the endplates down to the level of the posterior longitudinal ligament. The drill shavings were saved in a mucous trap for later arthrodesis.   We then measured the height of the intravertebral disc space and selected a 7 millimeter Peek interbody cage packed with autograft. It was then gently positioned in the intravertebral disc space and countersunk. I then used a 22 mm plate and placed four variable angle screws into the vertebral bodies and locked them into position. The wound was irrigated with bacitracin solution, checked for hemostasis which was established and confirmed. Once meticulous hemostasis was achieved, we then proceeded with closure. The platysma was closed with interrupted 3-0 undyed Vicryl suture, the subcuticular layer was closed with interrupted 3-0 undyed Vicryl suture. The skin edges were approximated with steristrips. The drapes were removed. A sterile dressing was applied. The patient was then awakened from general anesthesia and transferred to the recovery room in stable condition. At the end of the procedure all sponge, needle and instrument counts were correct.   PLAN OF CARE: Admit for overnight observation  PATIENT DISPOSITION:  PACU - hemodynamically stable.   Delay start of Pharmacological VTE agent (>24hrs) due to surgical blood loss or risk of bleeding:  yes

## 2016-06-16 NOTE — Progress Notes (Signed)
Care of pt assumed by MA Sharyah Bostwick RN 

## 2016-06-16 NOTE — H&P (Signed)
Subjective:   Patient is a 55 y.o. female admitted for ACDF. The patient first presented to me with complaints of neck pain. Onset of symptoms was several months ago. The pain is described as aching and occurs all day. The pain is rated severe, and is located  In the neck and radiates to the arms. The symptoms have been progressive. Symptoms are exacerbated by none, and are relieved by none.  Previous work up includes CT of cervical spine, results: pseudoarthrosis.  Past Medical History:  Diagnosis Date  . Adhesive capsulitis of left shoulder    Steroid injection Dr. Truman Hayward 1/12  . Adhesive capsulitis of right shoulder    Steroid injection Dr. Truman Hayward 1/12  . Adhesive capsulitis of shoulder    bilateral, Steroid injection Dr. Truman Hayward 1/12 bilaterally  . CAD (coronary artery disease)    nonobstructive. Last cardiac cath (2008) showing left circumflex with mid 50% stenosis and distal luminal irregularities. Also with RCA with mid to distal 30-40% stenosis. // Previously evaluated by [facility], never followed up outpatient.  Marland Kitchen CAP (community acquired pneumonia) 06/15/2012   05/2012 CXR: Mild opacification of the posterior lung base on the lateral film  as cannot exclude infection/atelectasis.    . CHF (congestive heart failure) ([place])   . Diabetes mellitus    Type II, insulin dependent  . Diabetic retinopathy   . GERD (gastroesophageal reflux disease)   . Glaucoma   . Hyperlipidemia   . Hypertension   . Obesity   . Peripheral neuropathy [facility])     Past Surgical History:  Procedure Laterality Date  . APPENDECTOMY    . CARDIAC CATHETERIZATION    . CATARACT EXTRACTION    . GLAUCOMA SURGERY    . LEFT HEART CATHETERIZATION WITH CORONARY ANGIOGRAM N/A 01/24/2012   Procedure: LEFT HEART CATHETERIZATION WITH CORONARY ANGIOGRAM;  Surgeon: Sherren Mocha, MD;  Location: [name] CATH LAB;  Service: Cardiovascular;  Laterality: N/A;  . POSTERIOR CERVICAL FUSION/FORAMINOTOMY N/A 10/22/2015   Procedure:  Cervical three-Cervical Seven Posterior cervical fusion with lateral mass fixation,  Laminectomy Cervical Three-Cervical Seven;  Surgeon: Eustace Moore, MD;  Location: [place] NEURO ORS;  Service: Neurosurgery;  Laterality: N/A;  Posterior  . TUBAL LIGATION      No Known Allergies  Social History  Substance Use Topics  . Smoking status: Former Smoker    Packs/day: 0.30    Years: 7.00    Types: Cigarettes    Quit date: 06/27/2006  . Smokeless tobacco: Never Used  . Alcohol use No    Family History  Problem Relation Age of Onset  . Hypertension Sister   . Hypertension Sister   . Hypertension Sister   . Heart attack Neg Hx   . Stroke Neg Hx    Prior to Admission medications   Medication Sig Start Date End Date Taking? Authorizing Provider  amitriptyline (ELAVIL) 25 MG tablet TAKE 1 TABLET BY MOUTH AT BEDTIME 06/08/16  Yes Collier Salina, MD  aspirin EC 81 MG EC tablet Take 1 tablet (81 mg total) by mouth daily. 05/01/15  Yes Erma Heritage, PA  carvedilol (COREG) 6.25 MG tablet TAKE 1 TABLET BY MOUTH TWICE DAILY 06/08/16  Yes Collier Salina, MD  cyclobenzaprine (FLEXERIL) 10 MG tablet Take 1 tablet (10 mg total) by mouth 3 (three) times daily as needed for muscle spasms. 05/13/16  Yes Alexa Angela Burke, MD  dorzolamide-timolol (COSOPT) 22.3-6.8 MG/ML ophthalmic solution Place 1 drop into both eyes 2 (two) times daily.  Yes Historical Provider, MD  furosemide (LASIX) 40 MG tablet Take 0.5 tablets (20 mg total) by mouth daily. 05/05/16  Yes Collier Salina, MD  gabapentin (NEURONTIN) 300 MG capsule TAKE ONE CAPSULE BY MOUTH THREE TIMES DAILY Patient taking differently: TAKE ONE CAPSULE BY MOUTH TWO TIMES DAILY 03/25/16  Yes Collier Salina, MD  insulin aspart (NOVOLOG FLEXPEN) 100 UNIT/ML FlexPen Inject 12 Units into the skin 3 (three) times daily with meals. 05/27/16  Yes Collier Salina, MD  Insulin Glargine (LANTUS SOLOSTAR) 100 UNIT/ML Solostar Pen Inject 50 Units into the  skin at bedtime. 05/27/16  Yes Collier Salina, MD  IRON PO Take 1 tablet by mouth 3 (three) times daily.   Yes Historical Provider, MD  latanoprost (XALATAN) 0.005 % ophthalmic solution Place 1 drop into both eyes at bedtime. Reported on 10/29/2015 05/12/15  Yes Historical Provider, MD  lisinopril (PRINIVIL,ZESTRIL) 20 MG tablet Take 0.5 tablets (10 mg total) by mouth daily. 09/09/15  Yes Tasrif Ahmed, MD  metFORMIN (GLUCOPHAGE) 1000 MG tablet TAKE 1 TABLET TWICE DAILY WITH A MEAL 05/31/16  Yes Collier Salina, MD  oxyCODONE (ROXICODONE) 5 MG immediate release tablet Take 1 tablet (5 mg total) by mouth every 4 (four) hours as needed for severe pain or breakthrough pain. 05/27/16 06/16/16 Yes Collier Salina, MD  pantoprazole (PROTONIX) 40 MG tablet Take 1 tablet (40 mg total) by mouth daily. 04/18/16  Yes Oval Linsey, MD  pravastatin (PRAVACHOL) 20 MG tablet Take 1 tablet (20 mg total) by mouth daily. Patient taking differently: Take 20 mg by mouth every evening.  12/04/14  Yes Luan Moore, MD  Cetirizine HCl 10 MG CAPS Take 1 capsule (10 mg total) by mouth at bedtime. Patient not taking: Reported on 06/07/2016 03/11/16   Collier Salina, MD  dicyclomine (BENTYL) 20 MG tablet Take 1 tablet (20 mg total) by mouth 3 (three) times daily before meals. Patient not taking: Reported on 06/07/2016 05/10/16   Orpah Greek, MD  ferrous sulfate 325 (65 FE) MG tablet Take 1 tablet (325 mg total) by mouth daily with breakfast. Patient not taking: Reported on 06/07/2016 06/15/12   Dorian Heckle, MD  glucose blood (ONETOUCH VERIO) test strip Check blood sugar as instructed up to 3 times a day 03/14/16   Collier Salina, MD  Insulin Pen Needle (B-D UF III MINI PEN NEEDLES) 31G X 5 MM MISC Use to inject insulin 4-5 times daily    Madilyn Fireman, MD  [facility] DELICA LANCETS FINE MISC Check blood sugar as instructed up to 3 times a day 03/14/16   Collier Salina, MD     Review of  Systems  Positive ROS: neg  All other systems have been reviewed and were otherwise negative with the exception of those mentioned in the HPI and as above.  Objective: Vital signs in last 24 hours: Temp:  [98.3 F (36.8 C)] 98.3 F (36.8 C) (12/21 0944) Pulse Rate:  [95] 95 (12/21 0944) Resp:  [20] 20 (12/21 0944) BP: (114)/(56) 114/56 (12/21 0944) SpO2:  [99 %] 99 % (12/21 0944)  General Appearance: Alert, cooperative, no distress, appears stated age Head: Normocephalic, without obvious abnormality, atraumatic Eyes: PERRL, conjunctiva/corneas clear, EOM's intact      Neck: Supple, symmetrical, trachea midline, Back: Symmetric, no curvature, ROM normal, no CVA tenderness Lungs:  respirations unlabored Heart: Regular rate and rhythm Abdomen: Soft, non-tender Extremities: Extremities normal, atraumatic, no cyanosis or edema Pulses: 2+ and symmetric all extremities  Skin: Skin color, texture, turgor normal, no rashes or lesions  NEUROLOGIC:  Mental status: Alert and oriented x4, no aphasia, good attention span, fund of knowledge and memory  Motor Exam - grossly normal Sensory Exam - grossly normal Reflexes: 1+ Coordination - grossly normal Gait - grossly normal Balance - grossly normal Cranial Nerves: I: smell Not tested  II: visual acuity  OS: nl    OD: nl  II: visual fields Full to confrontation  II: pupils Equal, round, reactive to light  III,VII: ptosis None  III,IV,VI: extraocular muscles  Full ROM  V: mastication Normal  V: facial light touch sensation  Normal  V,VII: corneal reflex  Present  VII: facial muscle function - upper  Normal  VII: facial muscle function - lower Normal  VIII: hearing Not tested  IX: soft palate elevation  Normal  IX,X: gag reflex Present  XI: trapezius strength  5/5  XI: sternocleidomastoid strength 5/5  XI: neck flexion strength  5/5  XII: tongue strength  Normal    Data Review Lab Results  Component Value Date   WBC 12.5 (H)  06/10/2016   HGB 10.5 (L) 06/10/2016   HCT 32.9 (L) 06/10/2016   MCV 87.7 06/10/2016   PLT 281 06/10/2016   Lab Results  Component Value Date   NA 138 06/10/2016   K 4.2 06/10/2016   CL 103 06/10/2016   CO2 26 06/10/2016   BUN 19 06/10/2016   CREATININE 1.09 (H) 06/10/2016   GLUCOSE 146 (H) 06/10/2016   Lab Results  Component Value Date   INR 0.97 06/10/2016    Assessment:   Cervical neck pain with pseudoarthrosis C6-7. Patient has failed conservative therapy. Planned surgery : ACDF C6-7  Plan:   I explained the condition and procedure to the patient and answered any questions.  Patient wishes to proceed with procedure as planned. Understands risks/ benefits/ and expected or typical outcomes.  [name] S 06/16/2016 11:00 AM

## 2016-06-16 NOTE — Transfer of Care (Signed)
Immediate Anesthesia Transfer of Care Note  Patient: [name]  Procedure(s) Performed: Procedure(s) with comments: Anterior Cervical Decompression/discectomy Fusion - Cervical six - Cervical seven (N/A) - Anterior Cervical Decompression/discectomy Fusion - Cervical six - Cervical seven  Patient Location: PACU  Anesthesia Type:General  Level of Consciousness: awake, alert , oriented and sedated  Airway & Oxygen Therapy: Patient Spontanous Breathing and Patient connected to face mask oxygen  Post-op Assessment: Report given to RN, Post -op Vital signs reviewed and stable and Patient moving all extremities  Post vital signs: Reviewed and stable  Last Vitals:  Vitals:   06/16/16 0944  BP: (!) 114/56  Pulse: 95  Resp: 20  Temp: 36.8 C    Last Pain:  Vitals:   06/16/16 0944  TempSrc: Oral         Complications: No apparent anesthesia complications

## 2016-06-17 ENCOUNTER — Other Ambulatory Visit: Payer: Self-pay | Admitting: Internal Medicine

## 2016-06-17 DIAGNOSIS — E1142 Type 2 diabetes mellitus with diabetic polyneuropathy: Secondary | ICD-10-CM | POA: Diagnosis not present

## 2016-06-17 DIAGNOSIS — I2510 Atherosclerotic heart disease of native coronary artery without angina pectoris: Secondary | ICD-10-CM | POA: Diagnosis not present

## 2016-06-17 DIAGNOSIS — G609 Hereditary and idiopathic neuropathy, unspecified: Secondary | ICD-10-CM | POA: Diagnosis not present

## 2016-06-17 DIAGNOSIS — M542 Cervicalgia: Secondary | ICD-10-CM | POA: Diagnosis not present

## 2016-06-17 DIAGNOSIS — I509 Heart failure, unspecified: Secondary | ICD-10-CM | POA: Diagnosis not present

## 2016-06-17 DIAGNOSIS — M960 Pseudarthrosis after fusion or arthrodesis: Secondary | ICD-10-CM | POA: Diagnosis not present

## 2016-06-17 DIAGNOSIS — I110 Hypertensive heart disease with heart failure: Secondary | ICD-10-CM | POA: Diagnosis not present

## 2016-06-17 DIAGNOSIS — E785 Hyperlipidemia, unspecified: Secondary | ICD-10-CM | POA: Diagnosis not present

## 2016-06-17 DIAGNOSIS — K219 Gastro-esophageal reflux disease without esophagitis: Secondary | ICD-10-CM | POA: Diagnosis not present

## 2016-06-17 DIAGNOSIS — E11319 Type 2 diabetes mellitus with unspecified diabetic retinopathy without macular edema: Secondary | ICD-10-CM | POA: Diagnosis not present

## 2016-06-17 LAB — GLUCOSE, CAPILLARY: Glucose-Capillary: 332 mg/dL — ABNORMAL HIGH (ref 65–99)

## 2016-06-17 MED ORDER — CYCLOBENZAPRINE HCL 10 MG PO TABS: 10.0000 mg | ORAL_TABLET | Freq: Three times a day (TID) | ORAL | 2 refills | Status: DC | PRN

## 2016-06-17 MED ORDER — OXYCODONE HCL 5 MG PO TABS: 5.0000 mg | ORAL_TABLET | ORAL | 0 refills | Status: DC | PRN

## 2016-06-17 MED FILL — Thrombin For Soln 5000 Unit: CUTANEOUS | Qty: 5000 | Status: AC

## 2016-06-17 NOTE — Discharge Summary (Signed)
Physician Discharge Summary  Patient ID: [name] MRN: [id number] DOB/AGE: 09/22/1960 55 y.o.  Admit date: 06/16/2016 Discharge date: 06/17/2016  Admission Diagnoses: pseudoarthrosis    Discharge Diagnoses: same   Discharged Condition: good  Hospital Course: The patient was admitted on 06/16/2016 and taken to the operating room where the patient underwent ACDF C6-7. The patient tolerated the procedure well and was taken to the recovery room and then to the floor in stable condition. The hospital course was routine. There were no complications. The wound remained clean dry and intact. Pt had appropriate neck soreness. No complaints of arm pain or new N/T/W. The patient remained afebrile with stable vital signs, and tolerated a regular diet. The patient continued to increase activities, and pain was well controlled with oral pain medications.   Consults: None  Significant Diagnostic Studies:  Results for orders placed or performed during the hospital encounter of 06/16/16  Glucose, capillary  Result Value Ref Range   Glucose-Capillary 165 (H) 65 - 99 mg/dL  Glucose, capillary  Result Value Ref Range   Glucose-Capillary 130 (H) 65 - 99 mg/dL  Glucose, capillary  Result Value Ref Range   Glucose-Capillary 174 (H) 65 - 99 mg/dL  Glucose, capillary  Result Value Ref Range   Glucose-Capillary 328 (H) 65 - 99 mg/dL   Comment 1 Notify RN    Comment 2 Document in Chart     Dg Cervical Spine 1 View  Result Date: 06/16/2016 CLINICAL DATA:  Anterior cervical discectomy and interbody fusion. EXAM: DG C-ARM 61-120 MIN; DG CERVICAL SPINE - 1 VIEW COMPARISON:  X-rays from 05/03/2016. FINDINGS: 2 intraoperative spot fluoro films are submitted. Pre-existing posterior cervical fusion hardware is again noted. First cross-table spot fluoro film is centered on the C4-5 interspace. Second spot fluoro film is centered lower and demonstrates interbody graft markers at the C6-7 interspace with  anterior cervical plate bridging from [id number]. Plate is not well seen secondary to superimposition of the patient's shoulders. IMPRESSION: Status post ACDF with plating at C6-7. Study limited by fluoroscopic technique and patient shoulders, but no gross complicating features evident. Electronically Signed   By: Misty Stanley M.D.   On: 06/16/2016 13:10   Dg C-arm 1-60 Min  Result Date: 06/16/2016 CLINICAL DATA:  Anterior cervical discectomy and interbody fusion. EXAM: DG C-ARM 61-120 MIN; DG CERVICAL SPINE - 1 VIEW COMPARISON:  X-rays from 05/03/2016. FINDINGS: 2 intraoperative spot fluoro films are submitted. Pre-existing posterior cervical fusion hardware is again noted. First cross-table spot fluoro film is centered on the C4-5 interspace. Second spot fluoro film is centered lower and demonstrates interbody graft markers at the C6-7 interspace with anterior cervical plate bridging from [id number]. Plate is not well seen secondary to superimposition of the patient's shoulders. IMPRESSION: Status post ACDF with plating at C6-7. Study limited by fluoroscopic technique and patient shoulders, but no gross complicating features evident. Electronically Signed   By: Misty Stanley M.D.   On: 06/16/2016 13:10    Antibiotics:  Anti-infectives    Start     Dose/Rate Route Frequency Ordered Stop   06/16/16 2000  ceFAZolin (ANCEF) IVPB 1 g/50 mL premix     1 g 100 mL/hr over 30 Minutes Intravenous Every 8 hours 06/16/16 1848 06/17/16 0326   06/16/16 1214  bacitracin 50,000 Units in sodium chloride irrigation 0.9 % 500 mL irrigation  Status:  Discontinued       As needed 06/16/16 1215 06/16/16 1306   06/16/16 1115  ceFAZolin (ANCEF) IVPB  2g/100 mL premix     2 g 200 mL/hr over 30 Minutes Intravenous On call to O.R. 06/16/16 0932 06/16/16 1150      Discharge Exam: Blood pressure (!) 146/73, pulse (!) 103, temperature 98.5 F (36.9 C), temperature source Oral, resp. rate 18, last menstrual period 02/13/2011, SpO2  95 %. Neurologic: Grossly normal Dressing dry  Discharge Medications:   Allergies as of 06/17/2016   No Known Allergies     Medication List    TAKE these medications   amitriptyline 25 MG tablet Commonly known as:  ELAVIL TAKE 1 TABLET BY MOUTH AT BEDTIME   aspirin 81 MG EC tablet Take 1 tablet (81 mg total) by mouth daily.   carvedilol 6.25 MG tablet Commonly known as:  COREG TAKE 1 TABLET BY MOUTH TWICE DAILY   Cetirizine HCl 10 MG Caps Take 1 capsule (10 mg total) by mouth at bedtime.   cyclobenzaprine 10 MG tablet Commonly known as:  FLEXERIL Take 1 tablet (10 mg total) by mouth 3 (three) times daily as needed for muscle spasms.   dicyclomine 20 MG tablet Commonly known as:  BENTYL Take 1 tablet (20 mg total) by mouth 3 (three) times daily before meals.   dorzolamide-timolol 22.3-6.8 MG/ML ophthalmic solution Commonly known as:  COSOPT Place 1 drop into both eyes 2 (two) times daily.   ferrous sulfate 325 (65 FE) MG tablet Take 1 tablet (325 mg total) by mouth daily with breakfast.   furosemide 40 MG tablet Commonly known as:  LASIX Take 0.5 tablets (20 mg total) by mouth daily.   gabapentin 300 MG capsule Commonly known as:  NEURONTIN TAKE ONE CAPSULE BY MOUTH THREE TIMES DAILY What changed:  See the new instructions.   glucose blood test strip Commonly known as:  ONETOUCH VERIO Check blood sugar as instructed up to 3 times a day   insulin aspart 100 UNIT/ML FlexPen Commonly known as:  NOVOLOG FLEXPEN Inject 12 Units into the skin 3 (three) times daily with meals.   Insulin Glargine 100 UNIT/ML Solostar Pen Commonly known as:  LANTUS SOLOSTAR Inject 50 Units into the skin at bedtime.   Insulin Pen Needle 31G X 5 MM Misc Commonly known as:  B-D UF III MINI PEN NEEDLES Use to inject insulin 4-5 times daily   IRON PO Take 1 tablet by mouth 3 (three) times daily.   latanoprost 0.005 % ophthalmic solution Commonly known as:  XALATAN Place 1 drop  into both eyes at bedtime. Reported on 10/29/2015   lisinopril 20 MG tablet Commonly known as:  PRINIVIL,ZESTRIL Take 0.5 tablets (10 mg total) by mouth daily.   metFORMIN 1000 MG tablet Commonly known as:  GLUCOPHAGE TAKE 1 TABLET TWICE DAILY WITH A MEAL   ONETOUCH DELICA LANCETS FINE Misc Check blood sugar as instructed up to 3 times a day   oxyCODONE 5 MG immediate release tablet Commonly known as:  ROXICODONE Take 1 tablet (5 mg total) by mouth every 4 (four) hours as needed for severe pain or breakthrough pain.   pantoprazole 40 MG tablet Commonly known as:  PROTONIX Take 1 tablet (40 mg total) by mouth daily.   pravastatin 20 MG tablet Commonly known as:  PRAVACHOL Take 1 tablet (20 mg total) by mouth daily. What changed:  when to take this       Disposition: home   Final Dx: ACDF  Discharge Instructions     Remove dressing in 72 hours    Complete by:  As  directed    Call MD for:  difficulty breathing, headache or visual disturbances    Complete by:  As directed    Call MD for:  persistant nausea and vomiting    Complete by:  As directed    Call MD for:  redness, tenderness, or signs of infection (pain, swelling, redness, odor or green/yellow discharge around incision site)    Complete by:  As directed    Call MD for:  severe uncontrolled pain    Complete by:  As directed    Call MD for:  temperature >100.4    Complete by:  As directed    Diet - low sodium heart healthy    Complete by:  As directed    Discharge instructions    Complete by:  As directed    No heavy lifting, may shower   Increase activity slowly    Complete by:  As directed          Signed: JONES,DAVID S 06/17/2016, 7:07 AM

## 2016-06-17 NOTE — Progress Notes (Signed)
Patient alert and oriented, mae's well, voiding adequate amount of urine, swallowing without difficulty, no c/o pain at time of discharge. Patient discharged home with family. Script and discharged instructions given to patient. Patient and family stated understanding of instructions given. Patient has an appointment with Dr. Jones in 2 weeks ?

## 2016-06-18 DIAGNOSIS — G609 Hereditary and idiopathic neuropathy, unspecified: Secondary | ICD-10-CM | POA: Diagnosis not present

## 2016-06-19 DIAGNOSIS — G609 Hereditary and idiopathic neuropathy, unspecified: Secondary | ICD-10-CM | POA: Diagnosis not present

## 2016-06-20 DIAGNOSIS — G609 Hereditary and idiopathic neuropathy, unspecified: Secondary | ICD-10-CM | POA: Diagnosis not present

## 2016-06-21 DIAGNOSIS — G609 Hereditary and idiopathic neuropathy, unspecified: Secondary | ICD-10-CM | POA: Diagnosis not present

## 2016-06-22 ENCOUNTER — Encounter (HOSPITAL_COMMUNITY): Payer: Self-pay | Admitting: Neurological Surgery

## 2016-06-22 DIAGNOSIS — G609 Hereditary and idiopathic neuropathy, unspecified: Secondary | ICD-10-CM | POA: Diagnosis not present

## 2016-06-23 ENCOUNTER — Encounter: Payer: Self-pay | Admitting: Internal Medicine

## 2016-06-23 DIAGNOSIS — G609 Hereditary and idiopathic neuropathy, unspecified: Secondary | ICD-10-CM | POA: Diagnosis not present

## 2016-06-24 DIAGNOSIS — G609 Hereditary and idiopathic neuropathy, unspecified: Secondary | ICD-10-CM | POA: Diagnosis not present

## 2016-06-25 DIAGNOSIS — G609 Hereditary and idiopathic neuropathy, unspecified: Secondary | ICD-10-CM | POA: Diagnosis not present

## 2016-06-26 DIAGNOSIS — G609 Hereditary and idiopathic neuropathy, unspecified: Secondary | ICD-10-CM | POA: Diagnosis not present

## 2016-06-27 DIAGNOSIS — G609 Hereditary and idiopathic neuropathy, unspecified: Secondary | ICD-10-CM | POA: Diagnosis not present

## 2016-06-28 DIAGNOSIS — G609 Hereditary and idiopathic neuropathy, unspecified: Secondary | ICD-10-CM | POA: Diagnosis not present

## 2016-06-29 DIAGNOSIS — G609 Hereditary and idiopathic neuropathy, unspecified: Secondary | ICD-10-CM | POA: Diagnosis not present

## 2016-06-30 DIAGNOSIS — G609 Hereditary and idiopathic neuropathy, unspecified: Secondary | ICD-10-CM | POA: Diagnosis not present

## 2016-07-01 ENCOUNTER — Encounter: Payer: Self-pay | Admitting: Internal Medicine

## 2016-07-01 DIAGNOSIS — G609 Hereditary and idiopathic neuropathy, unspecified: Secondary | ICD-10-CM | POA: Diagnosis not present

## 2016-07-02 DIAGNOSIS — G609 Hereditary and idiopathic neuropathy, unspecified: Secondary | ICD-10-CM | POA: Diagnosis not present

## 2016-07-03 DIAGNOSIS — G609 Hereditary and idiopathic neuropathy, unspecified: Secondary | ICD-10-CM | POA: Diagnosis not present

## 2016-07-04 DIAGNOSIS — G609 Hereditary and idiopathic neuropathy, unspecified: Secondary | ICD-10-CM | POA: Diagnosis not present

## 2016-07-05 DIAGNOSIS — G609 Hereditary and idiopathic neuropathy, unspecified: Secondary | ICD-10-CM | POA: Diagnosis not present

## 2016-07-06 DIAGNOSIS — G609 Hereditary and idiopathic neuropathy, unspecified: Secondary | ICD-10-CM | POA: Diagnosis not present

## 2016-07-07 ENCOUNTER — Other Ambulatory Visit: Payer: Self-pay

## 2016-07-07 DIAGNOSIS — G609 Hereditary and idiopathic neuropathy, unspecified: Secondary | ICD-10-CM | POA: Diagnosis not present

## 2016-07-07 NOTE — Telephone Encounter (Signed)
oxyCODONE (ROXICODONE) 5 MG immediate release tablet, refill request.  

## 2016-07-08 ENCOUNTER — Ambulatory Visit (INDEPENDENT_AMBULATORY_CARE_PROVIDER_SITE_OTHER): Payer: Medicare HMO | Admitting: Internal Medicine

## 2016-07-08 DIAGNOSIS — E1140 Type 2 diabetes mellitus with diabetic neuropathy, unspecified: Secondary | ICD-10-CM

## 2016-07-08 DIAGNOSIS — E11649 Type 2 diabetes mellitus with hypoglycemia without coma: Secondary | ICD-10-CM | POA: Diagnosis not present

## 2016-07-08 DIAGNOSIS — IMO0002 Reserved for concepts with insufficient information to code with codable children: Secondary | ICD-10-CM

## 2016-07-08 DIAGNOSIS — E1165 Type 2 diabetes mellitus with hyperglycemia: Secondary | ICD-10-CM

## 2016-07-08 DIAGNOSIS — M79671 Pain in right foot: Secondary | ICD-10-CM | POA: Insufficient documentation

## 2016-07-08 DIAGNOSIS — G609 Hereditary and idiopathic neuropathy, unspecified: Secondary | ICD-10-CM | POA: Diagnosis not present

## 2016-07-08 DIAGNOSIS — Z794 Long term (current) use of insulin: Secondary | ICD-10-CM | POA: Diagnosis not present

## 2016-07-08 MED ORDER — INSULIN GLARGINE 100 UNIT/ML SOLOSTAR PEN: 45.0000 [IU] | PEN_INJECTOR | Freq: Every day | SUBCUTANEOUS | 0 refills | Status: DC

## 2016-07-08 NOTE — Patient Instructions (Addendum)
[name],  It was a pleasure meeting you today.  Your foot pain is most likely related to stress injury from the way you are walking. I am going to try to get you set up with diabetic shoes and get you a referral to podiatry. Since I think this is more an inflammatory process I would like you to try ibuprofen as needed for the pain. If your symptoms are not improving by the time you see Dr. Benjamine Mola we can consider imaging.   I would like you to decrease your nighttime Lantus dose from 50 units to 45 units. Please continue to take the short acting Novolog insulin 12 units three times a day with meals. If you do not eat a meal, please do not take the Novolog. It is very important that you check your blood sugars at home and bring your meter to your appointment so we can adjust your insulin dosing. You have an appointment with Dr. Benjamine Mola on 07/22/16. Please follow up with him and bring your meter and medications to that visit.

## 2016-07-08 NOTE — Telephone Encounter (Signed)
Seen today in [name]

## 2016-07-08 NOTE — Assessment & Plan Note (Addendum)
Complaints of right foot swelling and pain for the past week. Reports has gotten progressively worsen and has difficulty walking or putting shoes on. Reports a throbbing pain that occurs constantly, worse with pressure. Has been doing epsom salt baths and elevating her feet at night. Nothing makes it better. Worst first thing in the morning when she sits up and swings her feet over the edge of the bed before she even puts any pressure on it. Symptoms do not improved during the day with stretching. Denies any trauma. No erythema or warmth. No fevers or chills.   On exam there are no wounds or sores. No warmth or erythema. Very tender to palpation over posterior plantar surface and heel of right foot. 2+ pulses present. No obvious bony abnormality. Intact sensation.   She reports wearing shoes with little to no support or cushion.   A/P Unclear as to the exact cause of her foot pain. It does not appear that she is very active at baseline. No trauma or injury, sores/wounds or signs of infection. No bony abnormalities or calluses present. Symptoms not consistent with plantar fasciitis. She has good intact sensation.  Will recommend NSAIDs/Ice/Heat/Rest. Recommend wearing shoes with more support such as tennis shoes.  Will try to arrange for DM shoes and referral to podiatry.  RTC in 2 weeks for follow up. If no improvement, can consider imaging at that time.

## 2016-07-08 NOTE — Progress Notes (Signed)
   CC: Right foot pain  HPI:  [name] is a 56 y.o. female with a past medical history listed below here today with complaints of right foot pain. Complaints of right foot swelling and pain for the past week. Reports has gotten progressively worsen and has difficulty walking or putting shoes on. Reports a throbbing pain that occurs constantly, worse with pressure. Has been doing epsom salt baths and elevating her feet at night. Nothing makes it better. Worst first thing in the morning when she sits up and swings her feet over the edge of the bed before she even puts any pressure on it. Denies any trauma. No erythema or warmth. No fevers or chills.   Reports she Metformin 1000 mg bid, Lantus 50 units qhs (increased from 44 units 12/1), prandial insulin 12 units tid qac (increased from 10 units 12/1). Did not bring her meter with her today. Reports that she has been having lows in the mornings in the 70s 2-3 days the past week. Feels shaky and weak when her sugars are this low. Drinks a glass of orange juice and checks her sugars an hour later and sugars improve to the 120s with resolution of symptoms. Only checking in the mornings and evenings. In the evenings its is in the 180s after eating dinner. Never has any symptoms of hypoglycemia during the day.   Past Medical History:  Diagnosis Date  . Adhesive capsulitis of left shoulder    Steroid injection Dr. Truman Hayward 1/12  . Adhesive capsulitis of right shoulder    Steroid injection Dr. Truman Hayward 1/12  . Adhesive capsulitis of shoulder    bilateral, Steroid injection Dr. Truman Hayward 1/12 bilaterally  . CAD (coronary artery disease)    nonobstructive. Last cardiac cath (2008) showing left circumflex with mid 50% stenosis and distal luminal irregularities. Also with RCA with mid to distal 30-40% stenosis. // Previously evaluated by [facility], never followed up outpatient.  Marland Kitchen CAP (community acquired pneumonia) 06/15/2012   05/2012 CXR: Mild  opacification of the posterior lung base on the lateral film  as cannot exclude infection/atelectasis.    . CHF (congestive heart failure) ([place])   . Diabetes mellitus    Type II, insulin dependent  . Diabetic retinopathy   . GERD (gastroesophageal reflux disease)   . Glaucoma   . Hyperlipidemia   . Hypertension   . Obesity   . Peripheral neuropathy ([facility])     Review of Systems:   Negative except as noted in HPI  Physical Exam:  Vitals:   07/08/16 0923  BP: (!) 151/67  Pulse: (!) 108  Temp: 97.8 F (36.6 C)  TempSrc: Oral  SpO2: 96%  Weight: 258 lb 14.4 oz (117.4 kg)  Height: 5\' 6"  (1.676 m)   Physical Exam  Constitutional: She is well-developed, well-nourished, and in no distress. No distress.  Cardiovascular: Normal rate and regular rhythm.   Pulmonary/Chest: Effort normal and breath sounds normal.  Skin: Skin is warm and dry.  Right foot with no wounds or sores. No warmth or erythema. Very tender to palpation over posterior plantar surface and heel. 2+ pulses present. Intact sensation. Trace pedal edema. No bony abnormalities or calluses present.     Assessment & Plan:   See Encounters Tab for problem based charting.  Patient discussed with Dr. Lynnae January

## 2016-07-08 NOTE — Assessment & Plan Note (Signed)
Reports she Metformin 1000 mg bid, Lantus 50 units qhs (increased from 44 units 12/1), prandial insulin 12 units tid qac (increased from 10 units 12/1). Did not bring her meter with her today. Reports that she has been having lows in the mornings in the 70s 2-3 days the past week. Feels shaky and weak when her sugars are this low. Drinks a glass of orange juice and checks her sugars an hour later and sugars improve to the 120s with resolution of symptoms. Only checking in the mornings and evenings. In the evenings its is in the 180s after eating dinner. Never has any symptoms of hypoglycemia during the day.   A/P Will decrease Lantus 50 > 45 units qhs today.  RTC in 2 weeks for follow up.

## 2016-07-09 DIAGNOSIS — G609 Hereditary and idiopathic neuropathy, unspecified: Secondary | ICD-10-CM | POA: Diagnosis not present

## 2016-07-10 DIAGNOSIS — G609 Hereditary and idiopathic neuropathy, unspecified: Secondary | ICD-10-CM | POA: Diagnosis not present

## 2016-07-11 DIAGNOSIS — G609 Hereditary and idiopathic neuropathy, unspecified: Secondary | ICD-10-CM | POA: Diagnosis not present

## 2016-07-12 ENCOUNTER — Telehealth: Payer: Self-pay

## 2016-07-12 DIAGNOSIS — G609 Hereditary and idiopathic neuropathy, unspecified: Secondary | ICD-10-CM | POA: Diagnosis not present

## 2016-07-12 NOTE — Progress Notes (Signed)
Internal Medicine Clinic Attending  Case discussed with Dr. Boswell at the time of the visit.  We reviewed the resident's history and exam and pertinent patient test results.  I agree with the assessment, diagnosis, and plan of care documented in the resident's note.  

## 2016-07-12 NOTE — Telephone Encounter (Signed)
Requesting pain med to be filled.  

## 2016-07-12 NOTE — Telephone Encounter (Signed)
Call made to pt-states she gets oxycodone from [facility] as he no longer wants her to take norco.  Medication no longer on her medication list. Per epic, pt had an anterior cervical decompression/discectomy fusion on 06/16/2016 -  and was rx'd oxycodone 5mg   #40 on 06-17-2016 by her surgeon.  Pt states that rx was only for 2wks and now needs a refill.  Will forward to pcp for review, please advise.Despina Hidden Cassady1/16/20183:15 PM    Last office visit:  07/08/2016 [facility]) Last UDS: 03/01/2016 Last Refill: 12/22/20189 #40 (by surgeon) Next appt: 07/22/2016 w/pcp

## 2016-07-13 DIAGNOSIS — G609 Hereditary and idiopathic neuropathy, unspecified: Secondary | ICD-10-CM | POA: Diagnosis not present

## 2016-07-14 DIAGNOSIS — G609 Hereditary and idiopathic neuropathy, unspecified: Secondary | ICD-10-CM | POA: Diagnosis not present

## 2016-07-15 DIAGNOSIS — G609 Hereditary and idiopathic neuropathy, unspecified: Secondary | ICD-10-CM | POA: Diagnosis not present

## 2016-07-16 DIAGNOSIS — G609 Hereditary and idiopathic neuropathy, unspecified: Secondary | ICD-10-CM | POA: Diagnosis not present

## 2016-07-17 DIAGNOSIS — G609 Hereditary and idiopathic neuropathy, unspecified: Secondary | ICD-10-CM | POA: Diagnosis not present

## 2016-07-18 DIAGNOSIS — G609 Hereditary and idiopathic neuropathy, unspecified: Secondary | ICD-10-CM | POA: Diagnosis not present

## 2016-07-18 NOTE — Telephone Encounter (Signed)
Needs to speak with a nurse regarding pain med.  

## 2016-07-19 DIAGNOSIS — G609 Hereditary and idiopathic neuropathy, unspecified: Secondary | ICD-10-CM | POA: Diagnosis not present

## 2016-07-19 NOTE — Telephone Encounter (Signed)
[name] was prescribed adequate course of oral narcotics to treat her pain until surgery and prescribed by her surgeon for post-op pain to last until her scheduled appointment with me earlier this month. She did not show up to that appointment. She needs to come and see me before I will continue to refill this medication and can do so any time this month as I am in [facility].

## 2016-07-20 DIAGNOSIS — G609 Hereditary and idiopathic neuropathy, unspecified: Secondary | ICD-10-CM | POA: Diagnosis not present

## 2016-07-21 DIAGNOSIS — G609 Hereditary and idiopathic neuropathy, unspecified: Secondary | ICD-10-CM | POA: Diagnosis not present

## 2016-07-22 ENCOUNTER — Other Ambulatory Visit: Payer: Self-pay | Admitting: Internal Medicine

## 2016-07-22 ENCOUNTER — Ambulatory Visit (INDEPENDENT_AMBULATORY_CARE_PROVIDER_SITE_OTHER): Payer: Medicare HMO | Admitting: Internal Medicine

## 2016-07-22 ENCOUNTER — Encounter: Payer: Self-pay | Admitting: Internal Medicine

## 2016-07-22 VITALS — BP 133/58 | HR 91 | Temp 97.5°F | Ht 66.0 in | Wt 258.4 lb

## 2016-07-22 DIAGNOSIS — Z79891 Long term (current) use of opiate analgesic: Secondary | ICD-10-CM

## 2016-07-22 DIAGNOSIS — Z79899 Other long term (current) drug therapy: Secondary | ICD-10-CM | POA: Diagnosis not present

## 2016-07-22 DIAGNOSIS — E1140 Type 2 diabetes mellitus with diabetic neuropathy, unspecified: Secondary | ICD-10-CM

## 2016-07-22 DIAGNOSIS — M4802 Spinal stenosis, cervical region: Secondary | ICD-10-CM | POA: Diagnosis not present

## 2016-07-22 DIAGNOSIS — E1165 Type 2 diabetes mellitus with hyperglycemia: Secondary | ICD-10-CM

## 2016-07-22 DIAGNOSIS — M170 Bilateral primary osteoarthritis of knee: Secondary | ICD-10-CM | POA: Diagnosis not present

## 2016-07-22 DIAGNOSIS — M545 Low back pain: Secondary | ICD-10-CM

## 2016-07-22 DIAGNOSIS — F1120 Opioid dependence, uncomplicated: Secondary | ICD-10-CM

## 2016-07-22 DIAGNOSIS — IMO0002 Reserved for concepts with insufficient information to code with codable children: Secondary | ICD-10-CM

## 2016-07-22 DIAGNOSIS — Z981 Arthrodesis status: Secondary | ICD-10-CM

## 2016-07-22 DIAGNOSIS — G8929 Other chronic pain: Secondary | ICD-10-CM | POA: Diagnosis not present

## 2016-07-22 DIAGNOSIS — G609 Hereditary and idiopathic neuropathy, unspecified: Secondary | ICD-10-CM | POA: Diagnosis not present

## 2016-07-22 DIAGNOSIS — Z794 Long term (current) use of insulin: Secondary | ICD-10-CM | POA: Diagnosis not present

## 2016-07-22 DIAGNOSIS — M79671 Pain in right foot: Secondary | ICD-10-CM | POA: Diagnosis not present

## 2016-07-22 DIAGNOSIS — Z1239 Encounter for other screening for malignant neoplasm of breast: Secondary | ICD-10-CM

## 2016-07-22 DIAGNOSIS — Z87891 Personal history of nicotine dependence: Secondary | ICD-10-CM

## 2016-07-22 DIAGNOSIS — I10 Essential (primary) hypertension: Secondary | ICD-10-CM

## 2016-07-22 DIAGNOSIS — B86 Scabies: Secondary | ICD-10-CM

## 2016-07-22 MED ORDER — METFORMIN HCL 1000 MG PO TABS: 1000.0000 mg | ORAL_TABLET | Freq: Two times a day (BID) | ORAL | 1 refills | Status: DC

## 2016-07-22 MED ORDER — FUROSEMIDE 20 MG PO TABS: 20.0000 mg | ORAL_TABLET | Freq: Every day | ORAL | 1 refills | Status: DC

## 2016-07-22 MED ORDER — OXYCODONE-ACETAMINOPHEN 5-325 MG PO TABS: 1.0000 | ORAL_TABLET | Freq: Three times a day (TID) | ORAL | 0 refills | Status: DC | PRN

## 2016-07-22 MED ORDER — GLUCOSE BLOOD VI STRP: ORAL_STRIP | 12 refills | Status: DC

## 2016-07-22 MED ORDER — INSULIN PEN NEEDLE 31G X 5 MM MISC: 5 refills | Status: DC

## 2016-07-22 NOTE — Progress Notes (Signed)
   CC: Follow up for neck pain  HPI:  [name] is a 56 y.o. woman here for follow up of her chronic neck and back pain.  See problem based assessment and plan below for additional details  Past Medical History:  Diagnosis Date  . Adhesive capsulitis of left shoulder    Steroid injection Dr. Truman Hayward 1/12  . Adhesive capsulitis of right shoulder    Steroid injection Dr. Truman Hayward 1/12  . Adhesive capsulitis of shoulder    bilateral, Steroid injection Dr. Truman Hayward 1/12 bilaterally  . CAD (coronary artery disease)    nonobstructive. Last cardiac cath (2008) showing left circumflex with mid 50% stenosis and distal luminal irregularities. Also with RCA with mid to distal 30-40% stenosis. // Previously evaluated by [facility], never followed up outpatient.  Marland Kitchen CAP (community acquired pneumonia) 06/15/2012   05/2012 CXR: Mild opacification of the posterior lung base on the lateral film  as cannot exclude infection/atelectasis.    . CHF (congestive heart failure) ([place])   . Diabetes mellitus    Type II, insulin dependent  . Diabetic retinopathy   . GERD (gastroesophageal reflux disease)   . Glaucoma   . Hyperlipidemia   . Hypertension   . Obesity   . Peripheral neuropathy ([place])     Review of Systems:  Review of Systems  Respiratory: Negative for shortness of breath.   Cardiovascular: Negative for chest pain.  Gastrointestinal: Negative for constipation.  Genitourinary: Negative for frequency.  Musculoskeletal: Positive for back pain, joint pain and neck pain. Negative for falls.  Neurological: Negative for focal weakness.  Endo/Heme/Allergies: Negative for polydipsia.    Physical Exam: Physical Exam  Constitutional: She is well-developed, well-nourished, and in no distress.  HENT:  Mouth/Throat: Oropharynx is clear and moist.  Neck:  Slightly restricted ROM particularly in extention Slightly hypertrophic surgical scar over anterior neck  Cardiovascular: Normal rate and  regular rhythm.   Pulmonary/Chest: Effort normal and breath sounds normal.  Musculoskeletal: She exhibits no edema.  Right foot appears good without any large callus or ulceration present, skin is very dry with slightly diminished sensation  Skin: No rash noted.    Vitals:   07/22/16 1523  BP: (!) 133/58  Pulse: 91  Temp: 97.5 F (36.4 C)  TempSrc: Oral  SpO2: 100%  Weight: 258 lb 6.4 oz (117.2 kg)  Height: 5\' 6"  (1.676 m)    Assessment & Plan:   See Encounters Tab for problem based charting.  Patient discussed with Dr. Dareen Piano

## 2016-07-22 NOTE — Patient Instructions (Signed)
It was a pleasure to see you today [name].  I am glad you are doing well today. I would like to see you again in 2 months and see where we are at with your diabetes.  In the meantime I would like to arrange a mammogram for your cancer screening.  I think your thumb pain is from tenosynovitis, inflammation of the tendon. This process is usually benign, if it becomes more severe you may need a steroid injection to fully resolve it. Let us know if it gets much worse or you have difficulty moving your thumb freely.

## 2016-07-23 DIAGNOSIS — G609 Hereditary and idiopathic neuropathy, unspecified: Secondary | ICD-10-CM | POA: Diagnosis not present

## 2016-07-25 ENCOUNTER — Telehealth: Payer: Self-pay | Admitting: Internal Medicine

## 2016-07-25 ENCOUNTER — Encounter: Payer: Self-pay | Admitting: Dietician

## 2016-07-25 DIAGNOSIS — G609 Hereditary and idiopathic neuropathy, unspecified: Secondary | ICD-10-CM | POA: Diagnosis not present

## 2016-07-25 NOTE — Progress Notes (Signed)
Internal Medicine Clinic Attending  Case discussed with Dr. Rice at the time of the visit.  We reviewed the resident's history and exam and pertinent patient test results.  I agree with the assessment, diagnosis, and plan of care documented in the resident's note.  

## 2016-07-25 NOTE — Assessment & Plan Note (Addendum)
HPI: She reports using her medications as directed. She is not having more episodes of hypoglycemia since decreasing the total dose of lantus insulin. She does not have her glucometer available for review today and states it is not working properly. Her current device was from 2014 so it may have depleted battery versus some other issue.  A: Type 2 diabetes mildly uncontrolled without new data to adjust treatment plan  P: -Provided new glucometer in clinic -Testing supples including strips, lancets ordered -[name] spent several minutes providing instruction for [name] on use of her device and diet recommendations -She has an opthalmology appointment on 08/26/16 with Dr. Katy [name] for eye exam

## 2016-07-25 NOTE — Assessment & Plan Note (Signed)
A: She is overdue for breast cancer screening last documented on 12/12/2012. She is agreeable to updating this.  P: Referral to breast center for screening mammography

## 2016-07-25 NOTE — Assessment & Plan Note (Signed)
[name] is on chronic opioid therapy for chronic pain. Her pain is due to multiple sources including osteoarthritis of the knees, diabetic neuropathy, chronic lower back pain, and cervical spinal stenosis with cord compression now s/p anterior and posterior fixation.  The date of the controlled substances contract is referenced in the [place] and / or the overview. Date of pain contract was 07/28/2014. As part of the treatment plan, the  controlled substance database is checked at least twice yearly and the database results are appropriate. I have last reviewed the results on 07/22/16.   The last UDS was on 03/11/16 and results show mostly as expected. Although her prescribed oxycodone is absent the metabolite is present. Last dose taken is not clearly specified to correlate. Patient needs at least a yearly UDS.   The patient is on oxycodone/acetaminophen (Percocet, Tylox) strength 5mg , 90 per 30 days. Adjunctive treatment includes NSAID's and Muscle relaxants. This regimen allows [name] to function and does not cause excessive sedation or other side effects.  "The benefits of continuing opioid therapy outweigh the risks and chronic opioids will be continued. Ongoing education about safe opioid treatment is provided  Interventions today include: Refills - 2 paper Rx printed for 90 tablets oxycodone-acetaminophen 5-325mg  for a 2 Month total supply

## 2016-07-25 NOTE — Telephone Encounter (Signed)
[employer]   Insurance per patient is requesting an authorization for  The patients   ONETOUCH VERIO test strip

## 2016-07-25 NOTE — Assessment & Plan Note (Signed)
A: Blood pressure is controlled today at 133/58. After reviewing her medications today my previous note in September is incorrect she has been taking lasix 20mg  daily rather than 40mg  as I had thought.  P: Continue carvedilol 6.25mg , lisinopril 20mg , lasix 20mg  reordered

## 2016-07-25 NOTE — Telephone Encounter (Signed)
Sent to [facility] Last script sent for 3000 strips, I think it should have been 300

## 2016-07-25 NOTE — Assessment & Plan Note (Signed)
Her right foot pain is improving since 2 weeks ago. There is no obvious lesion. She does not have a diabetic shoe or orthotics so I suspect this is more of a biomechanical problem. She has arranged a appointment with podiatry upcoming and I see no immediate problem for the meantime.

## 2016-07-26 ENCOUNTER — Other Ambulatory Visit: Payer: Self-pay | Admitting: *Deleted

## 2016-07-26 ENCOUNTER — Encounter: Payer: Self-pay | Admitting: Internal Medicine

## 2016-07-26 DIAGNOSIS — I10 Essential (primary) hypertension: Secondary | ICD-10-CM

## 2016-07-26 DIAGNOSIS — G609 Hereditary and idiopathic neuropathy, unspecified: Secondary | ICD-10-CM | POA: Diagnosis not present

## 2016-07-26 MED ORDER — LISINOPRIL 20 MG PO TABS: 20.0000 mg | ORAL_TABLET | Freq: Every day | ORAL | 3 refills | Status: DC

## 2016-07-27 ENCOUNTER — Other Ambulatory Visit: Payer: Self-pay | Admitting: Internal Medicine

## 2016-07-27 ENCOUNTER — Telehealth: Payer: Self-pay | Admitting: *Deleted

## 2016-07-27 DIAGNOSIS — L600 Ingrowing nail: Secondary | ICD-10-CM | POA: Diagnosis not present

## 2016-07-27 DIAGNOSIS — M2042 Other hammer toe(s) (acquired), left foot: Secondary | ICD-10-CM | POA: Diagnosis not present

## 2016-07-27 DIAGNOSIS — M2040 Other hammer toe(s) (acquired), unspecified foot: Secondary | ICD-10-CM | POA: Diagnosis not present

## 2016-07-27 DIAGNOSIS — M2041 Other hammer toe(s) (acquired), right foot: Secondary | ICD-10-CM | POA: Diagnosis not present

## 2016-07-27 DIAGNOSIS — G609 Hereditary and idiopathic neuropathy, unspecified: Secondary | ICD-10-CM | POA: Diagnosis not present

## 2016-07-27 NOTE — Telephone Encounter (Addendum)
Information sent to CoverMyMeds for [place] for OneTouch Verio Test Strips.  Awaiting answer thru fax from [facility].  Sander Nephew, RN 07/27/2016 10:04 AM. Virgel Gess message of denial using patient's Medicare Part D Plan.  Will need to have pharmacy use patient's Medicare Part B.  [place] was called and informed of.  Sander Nephew, RN 2/2/12018.  Sander Nephew, RN 07/28/2016 11:30 AM.

## 2016-07-28 DIAGNOSIS — G609 Hereditary and idiopathic neuropathy, unspecified: Secondary | ICD-10-CM | POA: Diagnosis not present

## 2016-07-29 DIAGNOSIS — G609 Hereditary and idiopathic neuropathy, unspecified: Secondary | ICD-10-CM | POA: Diagnosis not present

## 2016-07-30 DIAGNOSIS — G609 Hereditary and idiopathic neuropathy, unspecified: Secondary | ICD-10-CM | POA: Diagnosis not present

## 2016-07-31 DIAGNOSIS — G609 Hereditary and idiopathic neuropathy, unspecified: Secondary | ICD-10-CM | POA: Diagnosis not present

## 2016-08-01 DIAGNOSIS — G609 Hereditary and idiopathic neuropathy, unspecified: Secondary | ICD-10-CM | POA: Diagnosis not present

## 2016-08-02 DIAGNOSIS — G609 Hereditary and idiopathic neuropathy, unspecified: Secondary | ICD-10-CM | POA: Diagnosis not present

## 2016-08-03 DIAGNOSIS — G609 Hereditary and idiopathic neuropathy, unspecified: Secondary | ICD-10-CM | POA: Diagnosis not present

## 2016-08-04 DIAGNOSIS — G609 Hereditary and idiopathic neuropathy, unspecified: Secondary | ICD-10-CM | POA: Diagnosis not present

## 2016-08-05 DIAGNOSIS — G609 Hereditary and idiopathic neuropathy, unspecified: Secondary | ICD-10-CM | POA: Diagnosis not present

## 2016-08-06 DIAGNOSIS — G609 Hereditary and idiopathic neuropathy, unspecified: Secondary | ICD-10-CM | POA: Diagnosis not present

## 2016-08-07 DIAGNOSIS — G609 Hereditary and idiopathic neuropathy, unspecified: Secondary | ICD-10-CM | POA: Diagnosis not present

## 2016-08-08 DIAGNOSIS — M4802 Spinal stenosis, cervical region: Secondary | ICD-10-CM | POA: Diagnosis not present

## 2016-08-08 DIAGNOSIS — G609 Hereditary and idiopathic neuropathy, unspecified: Secondary | ICD-10-CM | POA: Diagnosis not present

## 2016-08-08 DIAGNOSIS — S129XXS Fracture of neck, unspecified, sequela: Secondary | ICD-10-CM | POA: Diagnosis not present

## 2016-08-09 DIAGNOSIS — G609 Hereditary and idiopathic neuropathy, unspecified: Secondary | ICD-10-CM | POA: Diagnosis not present

## 2016-08-10 DIAGNOSIS — G609 Hereditary and idiopathic neuropathy, unspecified: Secondary | ICD-10-CM | POA: Diagnosis not present

## 2016-08-11 DIAGNOSIS — G609 Hereditary and idiopathic neuropathy, unspecified: Secondary | ICD-10-CM | POA: Diagnosis not present

## 2016-08-12 DIAGNOSIS — L03031 Cellulitis of right toe: Secondary | ICD-10-CM | POA: Diagnosis not present

## 2016-08-12 DIAGNOSIS — M722 Plantar fascial fibromatosis: Secondary | ICD-10-CM | POA: Diagnosis not present

## 2016-08-12 DIAGNOSIS — G609 Hereditary and idiopathic neuropathy, unspecified: Secondary | ICD-10-CM | POA: Diagnosis not present

## 2016-08-13 DIAGNOSIS — G609 Hereditary and idiopathic neuropathy, unspecified: Secondary | ICD-10-CM | POA: Diagnosis not present

## 2016-08-14 DIAGNOSIS — G609 Hereditary and idiopathic neuropathy, unspecified: Secondary | ICD-10-CM | POA: Diagnosis not present

## 2016-08-15 DIAGNOSIS — G609 Hereditary and idiopathic neuropathy, unspecified: Secondary | ICD-10-CM | POA: Diagnosis not present

## 2016-08-16 DIAGNOSIS — G609 Hereditary and idiopathic neuropathy, unspecified: Secondary | ICD-10-CM | POA: Diagnosis not present

## 2016-08-17 DIAGNOSIS — G609 Hereditary and idiopathic neuropathy, unspecified: Secondary | ICD-10-CM | POA: Diagnosis not present

## 2016-08-18 DIAGNOSIS — G609 Hereditary and idiopathic neuropathy, unspecified: Secondary | ICD-10-CM | POA: Diagnosis not present

## 2016-08-19 DIAGNOSIS — G609 Hereditary and idiopathic neuropathy, unspecified: Secondary | ICD-10-CM | POA: Diagnosis not present

## 2016-08-20 DIAGNOSIS — G609 Hereditary and idiopathic neuropathy, unspecified: Secondary | ICD-10-CM | POA: Diagnosis not present

## 2016-08-21 DIAGNOSIS — G609 Hereditary and idiopathic neuropathy, unspecified: Secondary | ICD-10-CM | POA: Diagnosis not present

## 2016-08-22 ENCOUNTER — Other Ambulatory Visit: Payer: Self-pay | Admitting: Internal Medicine

## 2016-08-22 DIAGNOSIS — G609 Hereditary and idiopathic neuropathy, unspecified: Secondary | ICD-10-CM | POA: Diagnosis not present

## 2016-08-23 DIAGNOSIS — G609 Hereditary and idiopathic neuropathy, unspecified: Secondary | ICD-10-CM | POA: Diagnosis not present

## 2016-08-24 DIAGNOSIS — G609 Hereditary and idiopathic neuropathy, unspecified: Secondary | ICD-10-CM | POA: Diagnosis not present

## 2016-08-25 DIAGNOSIS — H401133 Primary open-angle glaucoma, bilateral, severe stage: Secondary | ICD-10-CM | POA: Diagnosis not present

## 2016-08-25 DIAGNOSIS — G609 Hereditary and idiopathic neuropathy, unspecified: Secondary | ICD-10-CM | POA: Diagnosis not present

## 2016-08-25 DIAGNOSIS — Z961 Presence of intraocular lens: Secondary | ICD-10-CM | POA: Diagnosis not present

## 2016-08-26 DIAGNOSIS — G609 Hereditary and idiopathic neuropathy, unspecified: Secondary | ICD-10-CM | POA: Diagnosis not present

## 2016-08-26 DIAGNOSIS — L600 Ingrowing nail: Secondary | ICD-10-CM | POA: Diagnosis not present

## 2016-08-27 DIAGNOSIS — G609 Hereditary and idiopathic neuropathy, unspecified: Secondary | ICD-10-CM | POA: Diagnosis not present

## 2016-08-28 DIAGNOSIS — G609 Hereditary and idiopathic neuropathy, unspecified: Secondary | ICD-10-CM | POA: Diagnosis not present

## 2016-08-29 DIAGNOSIS — G609 Hereditary and idiopathic neuropathy, unspecified: Secondary | ICD-10-CM | POA: Diagnosis not present

## 2016-08-30 DIAGNOSIS — G609 Hereditary and idiopathic neuropathy, unspecified: Secondary | ICD-10-CM | POA: Diagnosis not present

## 2016-08-31 DIAGNOSIS — G609 Hereditary and idiopathic neuropathy, unspecified: Secondary | ICD-10-CM | POA: Diagnosis not present

## 2016-09-01 ENCOUNTER — Other Ambulatory Visit: Payer: Self-pay | Admitting: Internal Medicine

## 2016-09-01 DIAGNOSIS — I10 Essential (primary) hypertension: Secondary | ICD-10-CM

## 2016-09-01 DIAGNOSIS — M542 Cervicalgia: Secondary | ICD-10-CM

## 2016-09-01 DIAGNOSIS — G609 Hereditary and idiopathic neuropathy, unspecified: Secondary | ICD-10-CM | POA: Diagnosis not present

## 2016-09-02 DIAGNOSIS — G609 Hereditary and idiopathic neuropathy, unspecified: Secondary | ICD-10-CM | POA: Diagnosis not present

## 2016-09-03 DIAGNOSIS — G609 Hereditary and idiopathic neuropathy, unspecified: Secondary | ICD-10-CM | POA: Diagnosis not present

## 2016-09-04 DIAGNOSIS — G609 Hereditary and idiopathic neuropathy, unspecified: Secondary | ICD-10-CM | POA: Diagnosis not present

## 2016-09-05 DIAGNOSIS — G609 Hereditary and idiopathic neuropathy, unspecified: Secondary | ICD-10-CM | POA: Diagnosis not present

## 2016-09-06 ENCOUNTER — Telehealth: Payer: Self-pay | Admitting: *Deleted

## 2016-09-06 DIAGNOSIS — G609 Hereditary and idiopathic neuropathy, unspecified: Secondary | ICD-10-CM | POA: Diagnosis not present

## 2016-09-06 NOTE — Telephone Encounter (Signed)
I had called pt about med refill - then she c/o burning/frequency upon urination. Stated she has an appt w/PCP but not until April. Told her about [facility] to address this problem only. Stated she would like to make an appt. Appt scheduled for tomorrow.

## 2016-09-07 ENCOUNTER — Other Ambulatory Visit: Payer: Self-pay | Admitting: Student in an Organized Health Care Education/Training Program

## 2016-09-07 ENCOUNTER — Ambulatory Visit (INDEPENDENT_AMBULATORY_CARE_PROVIDER_SITE_OTHER): Payer: Medicare HMO | Admitting: Internal Medicine

## 2016-09-07 VITALS — BP 125/61 | HR 96 | Temp 97.4°F | Wt 265.1 lb

## 2016-09-07 DIAGNOSIS — G609 Hereditary and idiopathic neuropathy, unspecified: Secondary | ICD-10-CM | POA: Diagnosis not present

## 2016-09-07 DIAGNOSIS — E1165 Type 2 diabetes mellitus with hyperglycemia: Secondary | ICD-10-CM

## 2016-09-07 DIAGNOSIS — B9689 Other specified bacterial agents as the cause of diseases classified elsewhere: Secondary | ICD-10-CM | POA: Diagnosis not present

## 2016-09-07 DIAGNOSIS — E1140 Type 2 diabetes mellitus with diabetic neuropathy, unspecified: Secondary | ICD-10-CM

## 2016-09-07 DIAGNOSIS — N3000 Acute cystitis without hematuria: Secondary | ICD-10-CM | POA: Insufficient documentation

## 2016-09-07 DIAGNOSIS — L03031 Cellulitis of right toe: Secondary | ICD-10-CM | POA: Diagnosis not present

## 2016-09-07 DIAGNOSIS — Z87440 Personal history of urinary (tract) infections: Secondary | ICD-10-CM

## 2016-09-07 DIAGNOSIS — N390 Urinary tract infection, site not specified: Secondary | ICD-10-CM | POA: Diagnosis not present

## 2016-09-07 DIAGNOSIS — IMO0002 Reserved for concepts with insufficient information to code with codable children: Secondary | ICD-10-CM

## 2016-09-07 LAB — POCT URINALYSIS DIPSTICK
BILIRUBIN UA: NEGATIVE
GLUCOSE UA: NEGATIVE
Ketones, UA: NEGATIVE
NITRITE UA: NEGATIVE
Protein, UA: NEGATIVE
Spec Grav, UA: 1.02
Urobilinogen, UA: 0.2
pH, UA: 5.5

## 2016-09-07 MED ORDER — SULFAMETHOXAZOLE-TRIMETHOPRIM 800-160 MG PO TABS: 1.0000 | ORAL_TABLET | Freq: Two times a day (BID) | ORAL | 0 refills | Status: DC

## 2016-09-07 NOTE — Patient Instructions (Signed)
Ms. Mccollister,  For your urinary tract infection, please take bactrim twice a day for the next 5 days. If you are still experiencing symptoms after completing your treatment course, or if you develop fevers, or nausea/vomiting, please return to clinic for follow up. If you have any questions or concerns, call our clinic at [phone] or after hours call [phone] and ask for the internal medicine resident on call. Thank you!

## 2016-09-07 NOTE — Progress Notes (Signed)
Case discussed with Dr. Guilloud at the time of the visit. We reviewed the resident's history and exam and pertinent patient test results. I agree with the assessment, diagnosis, and plan of care documented in the resident's note. 

## 2016-09-07 NOTE — Progress Notes (Signed)
   CC: Dysuria   HPI:  [name] is a 56 y.o. F with pmhx outlined below here with complaint of dysuria. Symptoms started a week and a half ago. She endorses frequency and burning with urination. She reports she has had UTIs in the past many years ago and these symptoms feel the same as her prior infections. She denies fevers at home, abdominal pain, and N/V.   Past Medical History:  Diagnosis Date  . Adhesive capsulitis of left shoulder    Steroid injection Dr. Truman Hayward 1/12  . Adhesive capsulitis of right shoulder    Steroid injection Dr. Truman Hayward 1/12  . Adhesive capsulitis of shoulder    bilateral, Steroid injection Dr. Truman Hayward 1/12 bilaterally  . CAD (coronary artery disease)    nonobstructive. Last cardiac cath (2008) showing left circumflex with mid 50% stenosis and distal luminal irregularities. Also with RCA with mid to distal 30-40% stenosis. // Previously evaluated by [name], never followed up outpatient.  Marland Kitchen CAP (community acquired pneumonia) 06/15/2012   05/2012 CXR: Mild opacification of the posterior lung base on the lateral film  as cannot exclude infection/atelectasis.    . CHF (congestive heart failure) ([place])   . Diabetes mellitus 2007   Type II, insulin dependent  . Diabetic retinopathy   . GERD (gastroesophageal reflux disease)   . Glaucoma   . Hyperlipidemia   . Hypertension   . Obesity   . Peripheral neuropathy ([place])     Review of Systems:  All pertinents listed in HPI, otherwise negative  Physical Exam:  Vitals:   09/07/16 1014  BP: 125/61  Pulse: 96  Temp: 97.4 F (36.3 C)  TempSrc: Oral  SpO2: 100%  Weight: 265 lb 1.6 oz (120.2 kg)    Constitutional: NAD, appears comfortable Cardiovascular: RRR  Pulmonary/Chest: CTAB Abdominal: Soft, non tender, non distended. +BS. Negative CVA tenderness.   Assessment & Plan:   See Encounters Tab for problem based charting.  Patient discussed with Dr. Eppie Gibson

## 2016-09-07 NOTE — Assessment & Plan Note (Signed)
Patient presented with dysuria x 10 days. She endorsed symptoms of frequency and burning with urination that felt similar to her prior UTIs many years ago. She denied fevers, abdominal pain, and N/V. Physical exam was reassuring with negative CVA tenderness. UA revealed + small leukocytes, negative nitrites, and trace microscopic hematuria.  -- Bactrim BID x 5 days  -- Gave return precautions if symptoms fail to improve or if she develops fever, nausea, or vomiting.

## 2016-09-08 ENCOUNTER — Other Ambulatory Visit: Payer: Self-pay | Admitting: Internal Medicine

## 2016-09-08 ENCOUNTER — Telehealth: Payer: Self-pay | Admitting: *Deleted

## 2016-09-08 DIAGNOSIS — G609 Hereditary and idiopathic neuropathy, unspecified: Secondary | ICD-10-CM | POA: Diagnosis not present

## 2016-09-08 NOTE — Telephone Encounter (Signed)
Current plan is for 1 tablet daily. Thanks.

## 2016-09-08 NOTE — Telephone Encounter (Signed)
Need clarification on Lisinopril - should pt be taking 1 tab daily or 0.5 tab daily? Thanks

## 2016-09-08 NOTE — Telephone Encounter (Signed)
Thanks. I will inform [place].

## 2016-09-09 DIAGNOSIS — G609 Hereditary and idiopathic neuropathy, unspecified: Secondary | ICD-10-CM | POA: Diagnosis not present

## 2016-09-10 DIAGNOSIS — G609 Hereditary and idiopathic neuropathy, unspecified: Secondary | ICD-10-CM | POA: Diagnosis not present

## 2016-09-11 DIAGNOSIS — G609 Hereditary and idiopathic neuropathy, unspecified: Secondary | ICD-10-CM | POA: Diagnosis not present

## 2016-09-12 ENCOUNTER — Other Ambulatory Visit: Payer: Self-pay

## 2016-09-12 DIAGNOSIS — G609 Hereditary and idiopathic neuropathy, unspecified: Secondary | ICD-10-CM | POA: Diagnosis not present

## 2016-09-12 MED ORDER — INSULIN GLARGINE 100 UNIT/ML SOLOSTAR PEN: 44.0000 [IU] | PEN_INJECTOR | Freq: Every day | SUBCUTANEOUS | 5 refills | Status: DC

## 2016-09-12 NOTE — Telephone Encounter (Signed)
Insulin Glargine (LANTUS SOLOSTAR) 100 UNIT/ML Solostar Pen, refill request @ [facility].

## 2016-09-13 DIAGNOSIS — G609 Hereditary and idiopathic neuropathy, unspecified: Secondary | ICD-10-CM | POA: Diagnosis not present

## 2016-09-14 DIAGNOSIS — G609 Hereditary and idiopathic neuropathy, unspecified: Secondary | ICD-10-CM | POA: Diagnosis not present

## 2016-09-15 DIAGNOSIS — G609 Hereditary and idiopathic neuropathy, unspecified: Secondary | ICD-10-CM | POA: Diagnosis not present

## 2016-09-16 DIAGNOSIS — G609 Hereditary and idiopathic neuropathy, unspecified: Secondary | ICD-10-CM | POA: Diagnosis not present

## 2016-09-17 DIAGNOSIS — G609 Hereditary and idiopathic neuropathy, unspecified: Secondary | ICD-10-CM | POA: Diagnosis not present

## 2016-09-18 DIAGNOSIS — G609 Hereditary and idiopathic neuropathy, unspecified: Secondary | ICD-10-CM | POA: Diagnosis not present

## 2016-09-19 DIAGNOSIS — G609 Hereditary and idiopathic neuropathy, unspecified: Secondary | ICD-10-CM | POA: Diagnosis not present

## 2016-09-20 DIAGNOSIS — G609 Hereditary and idiopathic neuropathy, unspecified: Secondary | ICD-10-CM | POA: Diagnosis not present

## 2016-09-21 ENCOUNTER — Encounter: Payer: Self-pay | Admitting: Internal Medicine

## 2016-09-21 DIAGNOSIS — G609 Hereditary and idiopathic neuropathy, unspecified: Secondary | ICD-10-CM | POA: Diagnosis not present

## 2016-09-22 DIAGNOSIS — G609 Hereditary and idiopathic neuropathy, unspecified: Secondary | ICD-10-CM | POA: Diagnosis not present

## 2016-09-23 ENCOUNTER — Emergency Department (HOSPITAL_COMMUNITY): Payer: Medicare HMO

## 2016-09-23 ENCOUNTER — Encounter (HOSPITAL_COMMUNITY): Payer: Self-pay | Admitting: Emergency Medicine

## 2016-09-23 ENCOUNTER — Emergency Department (HOSPITAL_COMMUNITY)
Admission: EM | Admit: 2016-09-23 | Discharge: 2016-09-23 | Disposition: A | Discharge status: Home / Self Care | Payer: Medicare HMO | Attending: Emergency Medicine | Admitting: Emergency Medicine

## 2016-09-23 DIAGNOSIS — I2510 Atherosclerotic heart disease of native coronary artery without angina pectoris: Secondary | ICD-10-CM | POA: Insufficient documentation

## 2016-09-23 DIAGNOSIS — Z794 Long term (current) use of insulin: Secondary | ICD-10-CM | POA: Insufficient documentation

## 2016-09-23 DIAGNOSIS — I110 Hypertensive heart disease with heart failure: Secondary | ICD-10-CM | POA: Insufficient documentation

## 2016-09-23 DIAGNOSIS — R1013 Epigastric pain: Secondary | ICD-10-CM | POA: Diagnosis not present

## 2016-09-23 DIAGNOSIS — I5020 Unspecified systolic (congestive) heart failure: Secondary | ICD-10-CM | POA: Insufficient documentation

## 2016-09-23 DIAGNOSIS — Z7982 Long term (current) use of aspirin: Secondary | ICD-10-CM | POA: Diagnosis not present

## 2016-09-23 DIAGNOSIS — E1140 Type 2 diabetes mellitus with diabetic neuropathy, unspecified: Secondary | ICD-10-CM | POA: Insufficient documentation

## 2016-09-23 DIAGNOSIS — K5909 Other constipation: Secondary | ICD-10-CM

## 2016-09-23 DIAGNOSIS — Z87891 Personal history of nicotine dependence: Secondary | ICD-10-CM | POA: Insufficient documentation

## 2016-09-23 DIAGNOSIS — R079 Chest pain, unspecified: Secondary | ICD-10-CM | POA: Diagnosis not present

## 2016-09-23 DIAGNOSIS — Z79899 Other long term (current) drug therapy: Secondary | ICD-10-CM | POA: Insufficient documentation

## 2016-09-23 DIAGNOSIS — R1011 Right upper quadrant pain: Secondary | ICD-10-CM

## 2016-09-23 DIAGNOSIS — E11319 Type 2 diabetes mellitus with unspecified diabetic retinopathy without macular edema: Secondary | ICD-10-CM | POA: Diagnosis not present

## 2016-09-23 DIAGNOSIS — R109 Unspecified abdominal pain: Secondary | ICD-10-CM | POA: Diagnosis not present

## 2016-09-23 DIAGNOSIS — G609 Hereditary and idiopathic neuropathy, unspecified: Secondary | ICD-10-CM | POA: Diagnosis not present

## 2016-09-23 LAB — LIPASE, BLOOD: LIPASE: 53 U/L — AB (ref 11–51)

## 2016-09-23 LAB — I-STAT TROPONIN, ED: TROPONIN I, POC: 0 ng/mL (ref 0.00–0.08)

## 2016-09-23 LAB — HEPATIC FUNCTION PANEL
ALBUMIN: 3.9 g/dL (ref 3.5–5.0)
ALT: 12 U/L — ABNORMAL LOW (ref 14–54)
AST: 16 U/L (ref 15–41)
Alkaline Phosphatase: 74 U/L (ref 38–126)
BILIRUBIN TOTAL: 0.4 mg/dL (ref 0.3–1.2)
Total Protein: 7.8 g/dL (ref 6.5–8.1)

## 2016-09-23 LAB — BASIC METABOLIC PANEL
ANION GAP: 9 (ref 5–15)
BUN: 14 mg/dL (ref 6–20)
CALCIUM: 9.7 mg/dL (ref 8.9–10.3)
CO2: 26 mmol/L (ref 22–32)
Chloride: 103 mmol/L (ref 101–111)
Creatinine, Ser: 0.9 mg/dL (ref 0.44–1.00)
GLUCOSE: 190 mg/dL — AB (ref 65–99)
POTASSIUM: 4.1 mmol/L (ref 3.5–5.1)
SODIUM: 138 mmol/L (ref 135–145)

## 2016-09-23 LAB — CBC
HEMATOCRIT: 35.7 % — AB (ref 36.0–46.0)
HEMOGLOBIN: 11.5 g/dL — AB (ref 12.0–15.0)
MCH: 27.6 pg (ref 26.0–34.0)
MCHC: 32.2 g/dL (ref 30.0–36.0)
MCV: 85.8 fL (ref 78.0–100.0)
Platelets: 327 10*3/uL (ref 150–400)
RBC: 4.16 MIL/uL (ref 3.87–5.11)
RDW: 14.6 % (ref 11.5–15.5)
WBC: 10.2 10*3/uL (ref 4.0–10.5)

## 2016-09-23 MED ORDER — GI COCKTAIL ~~LOC~~
30.0000 mL | Freq: Once | ORAL | Status: AC
  Administered 2016-09-23: 30 mL via ORAL
  Filled 2016-09-23: qty 30

## 2016-09-23 MED ORDER — MORPHINE SULFATE (PF) 4 MG/ML IV SOLN
4.0000 mg | Freq: Once | INTRAVENOUS | Status: AC
  Administered 2016-09-23: 4 mg via INTRAVENOUS
  Filled 2016-09-23: qty 1

## 2016-09-23 MED ORDER — RANITIDINE HCL 150 MG PO CAPS: 150.0000 mg | ORAL_CAPSULE | Freq: Every day | ORAL | 0 refills | Status: DC

## 2016-09-23 MED ORDER — IOPAMIDOL (ISOVUE-300) INJECTION 61%
INTRAVENOUS | Status: AC
  Administered 2016-09-23: 100 mL
  Filled 2016-09-23: qty 100

## 2016-09-23 MED ORDER — POLYETHYLENE GLYCOL 3350 17 G PO PACK: 17.0000 g | PACK | Freq: Every day | ORAL | 0 refills | Status: DC

## 2016-09-23 MED ORDER — ONDANSETRON HCL 4 MG/2ML IJ SOLN
4.0000 mg | Freq: Once | INTRAMUSCULAR | Status: AC
  Administered 2016-09-23: 4 mg via INTRAVENOUS
  Filled 2016-09-23: qty 2

## 2016-09-23 MED ORDER — OMEPRAZOLE 20 MG PO CPDR: 20.0000 mg | DELAYED_RELEASE_CAPSULE | Freq: Two times a day (BID) | ORAL | 0 refills | Status: DC

## 2016-09-23 NOTE — Discharge Instructions (Signed)
Please take prilosec and zantac 30 minutes before each meal.  Take miralax to help with constipation.  Followup with your primary care provider or with GI specialist for further evaluation of your symptoms.

## 2016-09-23 NOTE — ED Provider Notes (Signed)
[facility] DEPT Provider Note   CSN: [id number] Arrival date & time: 09/23/16  1006     History   Chief Complaint Chief Complaint  Patient presents with  . Chest Pain  . Abdominal Pain    HPI [name] is a 57 y.o. female.  HPI   56 year old female with history of uncontrolled diabetes, obesity, CAD, CHF, GERD presenting complaining of abdominal and chest pain. Patient report for the past week she has noticed progressive worsening pain to her epigastric region that radiates up towards her chest and her back. Pain is described as an achy sharp pain, worsening with eating or drinking. She is afraid to eat or drink due to the pain. Endorse nausea without vomiting or diarrhea. Pain is intense she does report some mild shortness of breath. She denies any associated fever, chills, headache, productive cough, hemoptysis,. He is vomiting, constipation or diarrhea. She denies any dysuria or hematuria. She was told that she has gallbladder disease in the past and it has similar pain. She still has intact gallbladder. She has history of GERD but states that this pain felt different. She denies alcohol abuse. Currently she rates the pain as 10 out of 10.   Past Medical History:  Diagnosis Date  . Adhesive capsulitis of left shoulder    Steroid injection Dr. Truman Hayward 1/12  . Adhesive capsulitis of right shoulder    Steroid injection Dr. Truman Hayward 1/12  . Adhesive capsulitis of shoulder    bilateral, Steroid injection Dr. Truman Hayward 1/12 bilaterally  . CAD (coronary artery disease)    nonobstructive. Last cardiac cath (2008) showing left circumflex with mid 50% stenosis and distal luminal irregularities. Also with RCA with mid to distal 30-40% stenosis. // Previously evaluated by [facility], never followed up outpatient.  Marland Kitchen CAP (community acquired pneumonia) 06/15/2012   05/2012 CXR: Mild opacification of the posterior lung base on the lateral film  as cannot exclude infection/atelectasis.      . CHF (congestive heart failure) ([place])   . Diabetes mellitus 2007   Type II, insulin dependent  . Diabetic retinopathy   . GERD (gastroesophageal reflux disease)   . Glaucoma   . Hyperlipidemia   . Hypertension   . Obesity   . Peripheral neuropathy [facility])     Patient Active Problem List   Diagnosis Date Noted  . Right foot pain 07/08/2016  . Non-alcoholic fatty liver disease 05/13/2016  . Scabies 02/12/2016  . S/P cervical spinal fusion 10/22/2015  . Spinal stenosis in cervical region 09/29/2015  . Neck pain on right side 08/07/2015  . Eczema 02/19/2015  . Right arm pain 12/04/2014  . Bilateral low back pain with sciatica   . Long-term current use of opiate analgesic 07/11/2014  . Onychomycosis 10/29/2013  . Severe nonproliferative diabetic retinopathy ([place]) 04/04/2013  . Primary open-angle glaucoma(365.11) 04/04/2013  . UTI (urinary tract infection) 03/08/2013  . Breast cancer screening 12/05/2012  . Bilateral knee pain 10/29/2012  . Abnormality of gait 09/29/2012  . Insomnia 06/15/2012  . Systolic CHF ([place]) 63/87/5643  . CAD (coronary artery disease) 11/02/2011  . Preventative health care 07/13/2011  . Nausea with vomiting 02/14/2011  . Adhesive capsulitis of right shoulder 11/16/2010  . Gastroesophageal reflux disease 10/12/2009  . Uncontrolled type 2 diabetes with neuropathy ([place]) 06/12/2006  . Hyperlipidemia 06/12/2006  . OBESITY 06/12/2006  . Essential hypertension 06/12/2006    Past Surgical History:  Procedure Laterality Date  . ANTERIOR CERVICAL DECOMP/DISCECTOMY FUSION N/A 06/16/2016   Procedure: Anterior  Cervical Decompression/discectomy Fusion - Cervical six - Cervical seven;  Surgeon: Eustace Moore, MD;  Location: [facility];  Service: Neurosurgery;  Laterality: N/A;  Anterior Cervical Decompression/discectomy Fusion - Cervical six - Cervical seven  . APPENDECTOMY    . CARDIAC CATHETERIZATION    . CATARACT EXTRACTION    . GLAUCOMA SURGERY    . LEFT HEART  CATHETERIZATION WITH CORONARY ANGIOGRAM N/A 01/24/2012   Procedure: LEFT HEART CATHETERIZATION WITH CORONARY ANGIOGRAM;  Surgeon: Sherren Mocha, MD;  Location: [facility] CATH LAB;  Service: Cardiovascular;  Laterality: N/A;  . POSTERIOR CERVICAL FUSION/FORAMINOTOMY N/A 10/22/2015   Procedure: Cervical three-Cervical Seven Posterior cervical fusion with lateral mass fixation,  Laminectomy Cervical Three-Cervical Seven;  Surgeon: Eustace Moore, MD;  Location: [facility] NEURO ORS;  Service: Neurosurgery;  Laterality: N/A;  Posterior  . TUBAL LIGATION      OB History    No data available       Home Medications    Prior to Admission medications   Medication Sig Start Date End Date Taking? Authorizing Provider  amitriptyline (ELAVIL) 25 MG tablet TAKE 1 TABLET EVERY NIGHT AT BEDTIME 07/23/16   Collier Salina, MD  aspirin EC 81 MG EC tablet Take 1 tablet (81 mg total) by mouth daily. 05/01/15   Erma Heritage, PA  carvedilol (COREG) 6.25 MG tablet TAKE 1 TABLET BY MOUTH TWICE DAILY 09/03/16   Collier Salina, MD  cetirizine (ZYRTEC) 10 MG tablet TAKE 1 TABLET BY MOUTH AT BEDTIME 07/23/16   Collier Salina, MD  cyclobenzaprine (FLEXERIL) 10 MG tablet TAKE 1 TABLET(10 MG) BY MOUTH TWICE DAILY AS NEEDED FOR MUSCLE SPASMS 09/03/16   Collier Salina, MD  dorzolamide-timolol (COSOPT) 22.3-6.8 MG/ML ophthalmic solution Place 1 drop into both eyes 2 (two) times daily.     Historical Provider, MD  ferrous sulfate 325 (65 FE) MG tablet Take 1 tablet (325 mg total) by mouth daily with breakfast. Patient not taking: Reported on 06/07/2016 06/15/12   Dorian Heckle, MD  furosemide (LASIX) 20 MG tablet Take 1 tablet (20 mg total) by mouth daily. 07/22/16   Collier Salina, MD  gabapentin (NEURONTIN) 300 MG capsule TAKE ONE CAPSULE BY MOUTH THREE TIMES DAILY 08/23/16   Collier Salina, MD  insulin aspart (NOVOLOG FLEXPEN) 100 UNIT/ML FlexPen Inject 12 Units into the skin 3 (three) times daily with meals.  09/09/16   Collier Salina, MD  Insulin Glargine (LANTUS SOLOSTAR) 100 UNIT/ML Solostar Pen Inject 44 Units into the skin at bedtime. 09/12/16   Collier Salina, MD  Insulin Pen Needle (B-D UF III MINI PEN NEEDLES) 31G X 5 MM MISC Use to inject insulin 4-5 times daily 07/22/16   Collier Salina, MD  IRON PO Take 1 tablet by mouth 3 (three) times daily.    Historical Provider, MD  latanoprost (XALATAN) 0.005 % ophthalmic solution Place 1 drop into both eyes at bedtime. Reported on 10/29/2015 05/12/15   Historical Provider, MD  lisinopril (PRINIVIL,ZESTRIL) 20 MG tablet Take 1 tablet (20 mg total) by mouth daily. 07/26/16   Collier Salina, MD  metFORMIN (GLUCOPHAGE) 1000 MG tablet TAKE 1 TABLET TWICE DAILY WITH A MEAL 09/03/16   Collier Salina, MD  [facility] LANCETS FINE MISC Check blood sugar as instructed up to 3 times a day 03/14/16   Collier Salina, MD  [facility] VERIO test strip USE TO CHECK BLOOD SUGAR UP TO THREE TIMES DAILY AS DIRECTED 07/23/16   Harrell Gave  Cassie Freer, MD  oxyCODONE-acetaminophen (PERCOCET/ROXICET) 5-325 MG tablet Take 1 tablet by mouth every 8 (eight) hours as needed for severe pain. 08/21/16   Collier Salina, MD  pantoprazole (PROTONIX) 40 MG tablet Take 1 tablet (40 mg total) by mouth daily. 04/18/16   Oval Linsey, MD  pravastatin (PRAVACHOL) 20 MG tablet Take 1 tablet (20 mg total) by mouth daily. Patient taking differently: Take 20 mg by mouth every evening.  12/04/14   Luan Moore, MD  sulfamethoxazole-trimethoprim (BACTRIM DS,SEPTRA DS) 800-160 MG tablet Take 1 tablet by mouth 2 (two) times daily. 09/07/16   Velna Ochs, MD    Family History Family History  Problem Relation Age of Onset  . Hypertension Sister   . Hypertension Sister   . Hypertension Sister   . Heart attack Neg Hx   . Stroke Neg Hx     Social History Social History  Substance Use Topics  . Smoking status: Former Smoker    Packs/day: 0.30    Years: 7.00    Types:  Cigarettes    Quit date: 06/27/2006  . Smokeless tobacco: Never Used  . Alcohol use No     Allergies   Patient has no known allergies.   Review of Systems Review of Systems  All other systems reviewed and are negative.    Physical Exam Updated Vital Signs BP 133/71   Pulse 97   Temp 97.9 F (36.6 C) (Oral)   Resp 18   LMP 02/13/2011   SpO2 100%   Physical Exam  Constitutional: She appears well-developed and well-nourished. No distress.  Obese female laying in bed in no acute discomfort.  HENT:  Head: Atraumatic.  Eyes: Conjunctivae are normal.  Neck: Neck supple.  Cardiovascular: Normal rate and regular rhythm.   Pulmonary/Chest: Effort normal and breath sounds normal.  Abdominal: Soft. She exhibits no distension. There is tenderness ( tenderness to epigastric and right upper quadrant on palpation without guarding or rebound tenderness. Positive Murphy sign. No pain at McBurney's point.).  Neurological: She is alert.  Skin: No rash noted.  Psychiatric: She has a normal mood and affect.  Nursing note and vitals reviewed.    ED Treatments / Results  Labs (all labs ordered are listed, but only abnormal results are displayed) Labs Reviewed  BASIC METABOLIC PANEL - Abnormal; Notable for the following:       Result Value   Glucose, Bld 190 (*)    All other components within normal limits  CBC - Abnormal; Notable for the following:    Hemoglobin 11.5 (*)    HCT 35.7 (*)    All other components within normal limits  LIPASE, BLOOD - Abnormal; Notable for the following:    Lipase 53 (*)    All other components within normal limits  HEPATIC FUNCTION PANEL - Abnormal; Notable for the following:    ALT 12 (*)    Bilirubin, Direct <0.1 (*)    All other components within normal limits  URINALYSIS, ROUTINE W REFLEX MICROSCOPIC  I-STAT TROPOININ, ED    EKG  EKG Interpretation  Date/Time:  Friday September 23 2016 10:10:27 EDT Ventricular Rate:  97 PR  Interval:  158 QRS Duration: 86 QT Interval:  348 QTC Calculation: 441 R Axis:   24 Text Interpretation:  Normal sinus rhythm Cannot rule out Anterior infarct , age undetermined Abnormal ECG since last tracing no significant change Confirmed by Eulis Foster  MD, ELLIOTT ([id number]) on 09/23/2016 10:58:41 AM       Radiology  Dg Chest 2 View  Result Date: 09/23/2016 CLINICAL DATA:  Chest pain, epigastric pain for 3 days, vomiting EXAM: CHEST  2 VIEW COMPARISON:  None. FINDINGS: 05/10/2016 IMPRESSION: Cardiomediastinal silhouette is stable. No infiltrate or pleural effusion. No pulmonary edema. Degenerative changes thoracic spine again noted. Electronically Signed   By: Lahoma Crocker M.D.   On: 09/23/2016 10:38   Ct Abdomen Pelvis W Contrast  Result Date: 09/23/2016 CLINICAL DATA:  Upper abdominal pain.  Nausea. EXAM: CT ABDOMEN AND PELVIS WITH CONTRAST TECHNIQUE: Multidetector CT imaging of the abdomen and pelvis was performed using the standard protocol following bolus administration of intravenous contrast. CONTRAST:  152mL ISOVUE-300 IOPAMIDOL (ISOVUE-300) INJECTION 61% COMPARISON:  Ultrasound of earlier today.  CT of 05/18/2014 FINDINGS: Lower chest: 5 mm right lower lobe pulmonary nodule on image 9/series 5 is not readily apparent on the prior. Borderline cardiomegaly, without pericardial or pleural effusion. Hepatobiliary: Normal liver. Normal gallbladder, without biliary ductal dilatation. Pancreas: Normal, without mass or ductal dilatation. Spleen: Normal in size, without focal abnormality. Adrenals/Urinary Tract: Normal adrenal glands. Normal kidneys, without hydronephrosis. Normal urinary bladder. Stomach/Bowel: Proximal gastric underdistention. Scattered colonic diverticula. Colonic stool burden suggests constipation. Normal terminal ileum. Appendectomy. Normal small bowel. Vascular/Lymphatic: Aortic and branch vessel atherosclerosis. No abdominopelvic adenopathy. Reproductive: Lobulated uterus, similar  and likely indicative of 1 or more fibroids. No adnexal mass. Other: Trace cul-de-sac fluid. Musculoskeletal: Mild osteoarthritis of the hips. Sacroiliac joint sclerosis is likely degenerative and unchanged. IMPRESSION: 1.  No acute process in the abdomen or pelvis. 2.  Possible constipation. 3.  Aortic atherosclerosis. 4. Trace cul-de-sac fluid. If this 56 year old patient is premenopausal, this is likely physiologic. If not, this is abnormal but nonspecific. 5. At least 1 uterine fibroid. 6. 5 mm right lower lobe pulmonary nodule. No follow-up needed if patient is low-risk. Non-contrast chest CT can be considered in 12 months if patient is high-risk. This recommendation follows the consensus statement: Guidelines for Management of Incidental Pulmonary Nodules Detected on CT Images: From the Fleischner Society 2017; Radiology 2017; 284:228-243. Electronically Signed   By: Abigail Miyamoto M.D.   On: 09/23/2016 13:40   US Abdomen Limited Ruq  Result Date: 09/23/2016 CLINICAL DATA:  Abdominal pain. EXAM: US ABDOMEN LIMITED - RIGHT UPPER QUADRANT COMPARISON:  Ultrasound 05/10/2016.  CT 05/18/2014 . FINDINGS: Gallbladder: No gallstones or wall thickening visualized. No sonographic Murphy sign noted by sonographer. Common bile duct: Diameter: 6.0 mm Liver: Increased echogenicity consistent fatty infiltration and/or hepatocellular disease. No focal hepatic abnormality identified. IMPRESSION: 1. Increased hepatic echogenicity consistent with fatty infiltration and/or hepatocellular disease. Similar finding on prior exam. No focal hepatic abnormality identified. 2. Exam otherwise unremarkable. No gallstones or biliary distention. Electronically Signed   By: Marcello Moores  Register   On: 09/23/2016 12:12    Procedures Procedures (including critical care time)  Medications Ordered in ED Medications  morphine 4 MG/ML injection 4 mg (4 mg Intravenous Given 09/23/16 1125)  ondansetron (ZOFRAN) injection 4 mg (4 mg Intravenous  Given 09/23/16 1125)  gi cocktail (Maalox,Lidocaine,Donnatal) (30 mLs Oral Given 09/23/16 1133)  iopamidol (ISOVUE-300) 61 % injection (100 mLs  Contrast Given 09/23/16 1309)  morphine 4 MG/ML injection 4 mg (4 mg Intravenous Given 09/23/16 1433)     Initial Impression / Assessment and Plan / ED Course  I have reviewed the triage vital signs and the nursing notes.  Pertinent labs & imaging results that were available during my care of the patient were reviewed by me and considered in my  medical decision making (see chart for details).     BP (!) 161/78   Pulse 94   Temp 97.9 F (36.6 C) (Oral)   Resp 18   LMP 02/13/2011   SpO2 100%    Final Clinical Impressions(s) / ED Diagnoses   Final diagnoses:  Epigastric pain  Other constipation    New Prescriptions Discharge Medication List as of 09/23/2016  2:58 PM    START taking these medications   Details  omeprazole (PRILOSEC) 20 MG capsule Take 1 capsule (20 mg total) by mouth 2 (two) times daily before a meal., Starting Fri 09/23/2016, Print    polyethylene glycol (MIRALAX / GLYCOLAX) packet Take 17 g by mouth daily., Starting Fri 09/23/2016, Print    ranitidine (ZANTAC) 150 MG capsule Take 1 capsule (150 mg total) by mouth daily., Starting Fri 09/23/2016, Print        11:01 AM Patient here with upper abdominal pain along with postprandial pain which has been ongoing for the past week. History of gallstones in the past. Pain is suggestive of bilious etiology. Will obtain ultrasound for further evaluation. Pain medication given  2:47 PM Labs are reassuring. Abdominal ultrasound without any acute finding. A follow-up abdominal pelvic CT scan without any acute process in the abdomen or pelvis. Possible constipation. Incidental 5 mm right lower lobe pulmonary nodule noted. Patient is not a smoker and therefore low suspicion for lung cancer at this time. The symptoms are controlled. Patient mention her last bowel movement was 3-4 days  ago, she should have bowel movements every 2 days. Patient will be discharge with PPI, and H2 blocker for her epigastric abdominal pain, as well as Miralax to help soften her stool. GI follow-up recommended. Return precaution discussed. Patient voiced understanding and agrees with plan.   Domenic Moras, PA-C 09/23/16 1605

## 2016-09-23 NOTE — ED Triage Notes (Signed)
Pt reports chest, epigastric and back painx 3 days, denies nausea but reports vomiting. Hx of chf. Pt ambulatory to triage, resp e/u, nad.

## 2016-09-24 DIAGNOSIS — G609 Hereditary and idiopathic neuropathy, unspecified: Secondary | ICD-10-CM | POA: Diagnosis not present

## 2016-09-25 DIAGNOSIS — G609 Hereditary and idiopathic neuropathy, unspecified: Secondary | ICD-10-CM | POA: Diagnosis not present

## 2016-09-26 ENCOUNTER — Other Ambulatory Visit: Payer: Self-pay | Admitting: Internal Medicine

## 2016-09-26 DIAGNOSIS — M4802 Spinal stenosis, cervical region: Secondary | ICD-10-CM

## 2016-09-26 DIAGNOSIS — G609 Hereditary and idiopathic neuropathy, unspecified: Secondary | ICD-10-CM | POA: Diagnosis not present

## 2016-09-26 NOTE — Telephone Encounter (Signed)
Rx last written 08/21/16. Last OV 3/14 with Dr Philipp Ovens. Next appt 4/6 with Dr Benjamine Mola. UDS 03/11/16.

## 2016-09-26 NOTE — Telephone Encounter (Signed)
Pain med refill °

## 2016-09-27 ENCOUNTER — Ambulatory Visit: Payer: Medicare HMO

## 2016-09-27 DIAGNOSIS — G609 Hereditary and idiopathic neuropathy, unspecified: Secondary | ICD-10-CM | POA: Diagnosis not present

## 2016-09-27 MED ORDER — OXYCODONE-ACETAMINOPHEN 5-325 MG PO TABS: 1.0000 | ORAL_TABLET | Freq: Three times a day (TID) | ORAL | 0 refills | Status: DC | PRN

## 2016-09-27 NOTE — Telephone Encounter (Signed)
Rx ready - pt called; no answer, left message. 

## 2016-09-28 DIAGNOSIS — G609 Hereditary and idiopathic neuropathy, unspecified: Secondary | ICD-10-CM | POA: Diagnosis not present

## 2016-09-29 DIAGNOSIS — G609 Hereditary and idiopathic neuropathy, unspecified: Secondary | ICD-10-CM | POA: Diagnosis not present

## 2016-09-30 ENCOUNTER — Ambulatory Visit (INDEPENDENT_AMBULATORY_CARE_PROVIDER_SITE_OTHER): Payer: Medicare HMO | Admitting: Internal Medicine

## 2016-09-30 ENCOUNTER — Encounter: Payer: Self-pay | Admitting: Internal Medicine

## 2016-09-30 VITALS — BP 149/58 | HR 102 | Temp 97.6°F | Ht 66.0 in | Wt 262.5 lb

## 2016-09-30 DIAGNOSIS — K5900 Constipation, unspecified: Secondary | ICD-10-CM | POA: Diagnosis not present

## 2016-09-30 DIAGNOSIS — Z794 Long term (current) use of insulin: Secondary | ICD-10-CM | POA: Diagnosis not present

## 2016-09-30 DIAGNOSIS — E1140 Type 2 diabetes mellitus with diabetic neuropathy, unspecified: Secondary | ICD-10-CM

## 2016-09-30 DIAGNOSIS — Z79891 Long term (current) use of opiate analgesic: Secondary | ICD-10-CM | POA: Diagnosis not present

## 2016-09-30 DIAGNOSIS — I2510 Atherosclerotic heart disease of native coronary artery without angina pectoris: Secondary | ICD-10-CM | POA: Diagnosis not present

## 2016-09-30 DIAGNOSIS — Z860100 Personal history of colon polyps, unspecified: Secondary | ICD-10-CM | POA: Insufficient documentation

## 2016-09-30 DIAGNOSIS — IMO0002 Reserved for concepts with insufficient information to code with codable children: Secondary | ICD-10-CM

## 2016-09-30 DIAGNOSIS — Z87891 Personal history of nicotine dependence: Secondary | ICD-10-CM | POA: Diagnosis not present

## 2016-09-30 DIAGNOSIS — R1013 Epigastric pain: Secondary | ICD-10-CM

## 2016-09-30 DIAGNOSIS — G609 Hereditary and idiopathic neuropathy, unspecified: Secondary | ICD-10-CM | POA: Diagnosis not present

## 2016-09-30 DIAGNOSIS — E669 Obesity, unspecified: Secondary | ICD-10-CM

## 2016-09-30 DIAGNOSIS — K5903 Drug induced constipation: Secondary | ICD-10-CM

## 2016-09-30 DIAGNOSIS — E1165 Type 2 diabetes mellitus with hyperglycemia: Secondary | ICD-10-CM

## 2016-09-30 LAB — GLUCOSE, CAPILLARY: GLUCOSE-CAPILLARY: 240 mg/dL — AB (ref 65–99)

## 2016-09-30 LAB — POCT GLYCOSYLATED HEMOGLOBIN (HGB A1C): Hemoglobin A1C: 9.4

## 2016-09-30 MED ORDER — SENNA 8.6 MG PO TABS: 2.0000 | ORAL_TABLET | Freq: Every day | ORAL | 0 refills | Status: DC

## 2016-09-30 NOTE — Patient Instructions (Addendum)
It was a pleasure to see you today [name].  Start taking a medicine senna 2 tablets daily to help with your digestive system function.  I'm also referring you to the gastroenterology clinic since I have seen you previously and would need follow-up for abnormal colonoscopy and may also have some beneficial and put on your current abdominal symptoms.  For your diabetes it is very important we get you back to taking your metformin every day. I will also recommend increasing your long-acting insulin back to 45 units as well as the short acting 12 units 3 times a day at meals.  We will contact you again about a follow-up appointment once again touch with the GI clinic. You may need to see one of my partner residents if an appointment is needed sooner than June.

## 2016-09-30 NOTE — Progress Notes (Signed)
   CC: Follow up for diabetes, abdominal pain  HPI:  [name] is a 56 y.o. woman here for follow up of her diabetes, chronic pain, and recent ED visit for abdominal pain and nausea.   See problem based assessment and plan below for additional details  Past Medical History:  Diagnosis Date  . Adhesive capsulitis of left shoulder    Steroid injection Dr. Truman Hayward 1/12  . Adhesive capsulitis of right shoulder    Steroid injection Dr. Truman Hayward 1/12  . Adhesive capsulitis of shoulder    bilateral, Steroid injection Dr. Truman Hayward 1/12 bilaterally  . CAD (coronary artery disease)    nonobstructive. Last cardiac cath (2008) showing left circumflex with mid 50% stenosis and distal luminal irregularities. Also with RCA with mid to distal 30-40% stenosis. // Previously evaluated by [facility], never followed up outpatient.  Marland Kitchen CAP (community acquired pneumonia) 06/15/2012   05/2012 CXR: Mild opacification of the posterior lung base on the lateral film  as cannot exclude infection/atelectasis.    . CHF (congestive heart failure) ([place])   . Diabetes mellitus 2007   Type II, insulin dependent  . Diabetic retinopathy   . GERD (gastroesophageal reflux disease)   . Glaucoma   . Hyperlipidemia   . Hypertension   . Obesity   . Peripheral neuropathy ([place])     Review of Systems:  Review of Systems  Constitutional: Positive for malaise/fatigue. Negative for fever.  Eyes: Negative for blurred vision.  Respiratory: Negative for shortness of breath.   Cardiovascular: Negative for leg swelling.  Gastrointestinal: Negative for nausea.  Genitourinary: Negative for dysuria.  Musculoskeletal: Positive for back pain and joint pain. Negative for falls.  Neurological: Negative for headaches.  Endo/Heme/Allergies: Negative for polydipsia.  Psychiatric/Behavioral: The patient does not have insomnia.     Physical Exam: Physical Exam  Constitutional:  Obese woman in no acute distress  Neck:  Well  healed surgical scars anterior and posteriorly  Cardiovascular: Normal rate and regular rhythm.   Pulmonary/Chest: Effort normal and breath sounds normal.  Abdominal: Soft. She exhibits no distension. There is no rebound.  Tenderness to palpation across left and right upper quadrants  Musculoskeletal: She exhibits no edema.  Skin: Skin is warm and dry.  Psychiatric: Affect normal.    Vitals:   09/30/16 1531 09/30/16 1532  BP: (!) 149/58 (!) 149/58  Pulse: (!) 102 (!) 102  Temp: 97.6 F (36.4 C) 97.6 F (36.4 C)  TempSrc: Oral Oral  SpO2:  99%  Weight:  262 lb 8 oz (119.1 kg)  Height:  5\' 6"  (1.676 m)    Assessment & Plan:   See Encounters Tab for problem based charting.  Patient discussed with Dr. Angelia Mould

## 2016-10-01 DIAGNOSIS — G609 Hereditary and idiopathic neuropathy, unspecified: Secondary | ICD-10-CM | POA: Diagnosis not present

## 2016-10-02 DIAGNOSIS — G609 Hereditary and idiopathic neuropathy, unspecified: Secondary | ICD-10-CM | POA: Diagnosis not present

## 2016-10-03 DIAGNOSIS — L97512 Non-pressure chronic ulcer of other part of right foot with fat layer exposed: Secondary | ICD-10-CM | POA: Diagnosis not present

## 2016-10-03 DIAGNOSIS — G609 Hereditary and idiopathic neuropathy, unspecified: Secondary | ICD-10-CM | POA: Diagnosis not present

## 2016-10-03 NOTE — Assessment & Plan Note (Addendum)
HPI: She has complaints of episodic nausea and abdominal pain, and was seen at the ED recently for the same. She does have some epigastric pain but denies heartburn and is taking her omprazole regularly. On review she reports passing stools once every 2-3 days which are often difficult. She denies any vomiting due to this problem. She has not noticed any bloody stools.  A: I suspect drug induced constipation with her chronic use of oxycodone and iron supplements with no motility promoting bowel regimen. She had partial benefit to starting miralax after recent ED visit but this is not an optimal choice. She does also have a history of large colonic polyps previously evaluated at GI clinic by Dr. Benson Norway in 2016 with recommendations for repeat follow up. With recent FOBT negative and her complaints this seems unlikely related to the current symptoms but certainly follow up is appropriate.  P: -Start senna 2 tablets daily for constipation -RTC sooner if symptoms are worsening or not improving -Referral to GI clinic

## 2016-10-03 NOTE — Assessment & Plan Note (Signed)
One 20cm colonic polyp and few small polyps removed in early 2016 with one subsequent study at 6 months unremarkable. Per the patient she was told to follow up again in a year but had not done so. She is agreeable to referral to GI again for this.

## 2016-10-03 NOTE — Assessment & Plan Note (Signed)
HPI: She is Since her last visit she has stopped taking her metformin regularly. This is partly due to some chronic constipation she worried it may be worsening. She also frequently forgets some doses. She is taking her insulin regularly. Hgb A1c is 9.4% checked in clinic today. She did not bring her glucometer for review but states she has not had any hypoglycemic episodes since our last visit.  A: Uncontrolled type 2 diabetes Slightly worsened control suggested by A1c compared to January. This may be due to reduced use of her metformin. I do not think the medicine is related to her constipation issues. She has not been making any particular effort to improve her diet. I suspect she will remain uncontrolled even with resuming her metformin, and previously increasing her insulin doses has led to episodes of hypoglycemia. She may be a good candidate for a GLP-1 agonist therapy if remaining uncontrolled, especially considering her CAD.  P: Recommended resuming metformin 1000mg  BID Continue insulin lantus 45U qHS and aspart 12U TIDAC Asked her to bring glucometer to next visit

## 2016-10-04 DIAGNOSIS — G609 Hereditary and idiopathic neuropathy, unspecified: Secondary | ICD-10-CM | POA: Diagnosis not present

## 2016-10-04 NOTE — Progress Notes (Signed)
Internal Medicine Clinic Attending  Case discussed with Dr. Rice at the time of the visit.  We reviewed the resident's history and exam and pertinent patient test results.  I agree with the assessment, diagnosis, and plan of care documented in the resident's note.  

## 2016-10-05 DIAGNOSIS — G609 Hereditary and idiopathic neuropathy, unspecified: Secondary | ICD-10-CM | POA: Diagnosis not present

## 2016-10-06 DIAGNOSIS — G609 Hereditary and idiopathic neuropathy, unspecified: Secondary | ICD-10-CM | POA: Diagnosis not present

## 2016-10-07 DIAGNOSIS — G609 Hereditary and idiopathic neuropathy, unspecified: Secondary | ICD-10-CM | POA: Diagnosis not present

## 2016-10-08 DIAGNOSIS — G609 Hereditary and idiopathic neuropathy, unspecified: Secondary | ICD-10-CM | POA: Diagnosis not present

## 2016-10-09 DIAGNOSIS — G609 Hereditary and idiopathic neuropathy, unspecified: Secondary | ICD-10-CM | POA: Diagnosis not present

## 2016-10-10 DIAGNOSIS — G609 Hereditary and idiopathic neuropathy, unspecified: Secondary | ICD-10-CM | POA: Diagnosis not present

## 2016-10-11 DIAGNOSIS — G609 Hereditary and idiopathic neuropathy, unspecified: Secondary | ICD-10-CM | POA: Diagnosis not present

## 2016-10-12 DIAGNOSIS — G609 Hereditary and idiopathic neuropathy, unspecified: Secondary | ICD-10-CM | POA: Diagnosis not present

## 2016-10-13 DIAGNOSIS — G609 Hereditary and idiopathic neuropathy, unspecified: Secondary | ICD-10-CM | POA: Diagnosis not present

## 2016-10-14 DIAGNOSIS — G609 Hereditary and idiopathic neuropathy, unspecified: Secondary | ICD-10-CM | POA: Diagnosis not present

## 2016-10-15 DIAGNOSIS — G609 Hereditary and idiopathic neuropathy, unspecified: Secondary | ICD-10-CM | POA: Diagnosis not present

## 2016-10-16 DIAGNOSIS — G609 Hereditary and idiopathic neuropathy, unspecified: Secondary | ICD-10-CM | POA: Diagnosis not present

## 2016-10-17 DIAGNOSIS — G609 Hereditary and idiopathic neuropathy, unspecified: Secondary | ICD-10-CM | POA: Diagnosis not present

## 2016-10-18 DIAGNOSIS — G609 Hereditary and idiopathic neuropathy, unspecified: Secondary | ICD-10-CM | POA: Diagnosis not present

## 2016-10-19 DIAGNOSIS — G609 Hereditary and idiopathic neuropathy, unspecified: Secondary | ICD-10-CM | POA: Diagnosis not present

## 2016-10-20 DIAGNOSIS — G609 Hereditary and idiopathic neuropathy, unspecified: Secondary | ICD-10-CM | POA: Diagnosis not present

## 2016-10-20 DIAGNOSIS — M2040 Other hammer toe(s) (acquired), unspecified foot: Secondary | ICD-10-CM | POA: Diagnosis not present

## 2016-10-20 DIAGNOSIS — T8189XA Other complications of procedures, not elsewhere classified, initial encounter: Secondary | ICD-10-CM | POA: Diagnosis not present

## 2016-10-20 DIAGNOSIS — L97512 Non-pressure chronic ulcer of other part of right foot with fat layer exposed: Secondary | ICD-10-CM | POA: Diagnosis not present

## 2016-10-20 DIAGNOSIS — M2041 Other hammer toe(s) (acquired), right foot: Secondary | ICD-10-CM | POA: Diagnosis not present

## 2016-10-21 DIAGNOSIS — G609 Hereditary and idiopathic neuropathy, unspecified: Secondary | ICD-10-CM | POA: Diagnosis not present

## 2016-10-22 DIAGNOSIS — G609 Hereditary and idiopathic neuropathy, unspecified: Secondary | ICD-10-CM | POA: Diagnosis not present

## 2016-10-23 DIAGNOSIS — G609 Hereditary and idiopathic neuropathy, unspecified: Secondary | ICD-10-CM | POA: Diagnosis not present

## 2016-10-24 DIAGNOSIS — G609 Hereditary and idiopathic neuropathy, unspecified: Secondary | ICD-10-CM | POA: Diagnosis not present

## 2016-10-24 DIAGNOSIS — Z86010 Personal history of colonic polyps: Secondary | ICD-10-CM | POA: Diagnosis not present

## 2016-10-24 DIAGNOSIS — R1013 Epigastric pain: Secondary | ICD-10-CM | POA: Diagnosis not present

## 2016-10-24 DIAGNOSIS — I10 Essential (primary) hypertension: Secondary | ICD-10-CM | POA: Diagnosis not present

## 2016-10-24 DIAGNOSIS — E119 Type 2 diabetes mellitus without complications: Secondary | ICD-10-CM | POA: Diagnosis not present

## 2016-10-25 ENCOUNTER — Telehealth: Payer: Self-pay | Admitting: Cardiovascular Disease

## 2016-10-25 DIAGNOSIS — G609 Hereditary and idiopathic neuropathy, unspecified: Secondary | ICD-10-CM | POA: Diagnosis not present

## 2016-10-25 NOTE — Telephone Encounter (Signed)
Received records from [facility] for appointment on 10/26/16 with Dr Gwenlyn Found.  Records put with Dr Kennon Holter schedule for  10/26/16. lp

## 2016-10-26 ENCOUNTER — Other Ambulatory Visit: Payer: Self-pay | Admitting: Internal Medicine

## 2016-10-26 ENCOUNTER — Ambulatory Visit (INDEPENDENT_AMBULATORY_CARE_PROVIDER_SITE_OTHER): Payer: Medicare HMO | Admitting: Cardiovascular Disease

## 2016-10-26 ENCOUNTER — Encounter: Payer: Self-pay | Admitting: Cardiovascular Disease

## 2016-10-26 VITALS — BP 152/80 | HR 106 | Ht 66.0 in | Wt 258.4 lb

## 2016-10-26 DIAGNOSIS — I10 Essential (primary) hypertension: Secondary | ICD-10-CM

## 2016-10-26 DIAGNOSIS — L97519 Non-pressure chronic ulcer of other part of right foot with unspecified severity: Secondary | ICD-10-CM | POA: Diagnosis not present

## 2016-10-26 DIAGNOSIS — I998 Other disorder of circulatory system: Secondary | ICD-10-CM | POA: Diagnosis not present

## 2016-10-26 DIAGNOSIS — B86 Scabies: Secondary | ICD-10-CM

## 2016-10-26 DIAGNOSIS — G609 Hereditary and idiopathic neuropathy, unspecified: Secondary | ICD-10-CM | POA: Diagnosis not present

## 2016-10-26 DIAGNOSIS — I70229 Atherosclerosis of native arteries of extremities with rest pain, unspecified extremity: Secondary | ICD-10-CM

## 2016-10-26 DIAGNOSIS — M542 Cervicalgia: Secondary | ICD-10-CM

## 2016-10-26 NOTE — Progress Notes (Signed)
10/26/2016 [name]   November 30, 1960  [id number]  Primary Physician Hinton Lovely, MD Primary Cardiologist: Lorretta Harp MD Renae Gloss  HPI:  [name] is a 56 year old moderately overweight single African-American female mother of 4, grandmother and one grandchild cardiology patient of Dr. Antionette Char referred by Dr. Barkley Bruns, diabetes, for peripheral vascular evaluation because of a nonhealing ulcer on her right third toe. She has been disabled since 2010 because of diabetes. She does have a history of hypertension, hyperlipidemia and diabetes which she's had for 33 years. She has a history of nonischemic cardiomyopathy with reduced ejection fraction. Apparently Dr. Barkley Bruns instrumented her right third toe 2 months ago and this has not healed. She has been treated with antibiotics.   Current Outpatient Prescriptions  Medication Sig Dispense Refill  . amitriptyline (ELAVIL) 25 MG tablet TAKE 1 TABLET EVERY NIGHT AT BEDTIME 90 tablet 0  . aspirin EC 81 MG EC tablet Take 1 tablet (81 mg total) by mouth daily.    . carvedilol (COREG) 6.25 MG tablet TAKE 1 TABLET BY MOUTH TWICE DAILY 180 tablet 0  . cetirizine (ZYRTEC) 10 MG tablet TAKE 1 TABLET BY MOUTH AT BEDTIME 90 tablet 0  . cyclobenzaprine (FLEXERIL) 10 MG tablet TAKE 1 TABLET(10 MG) BY MOUTH TWICE DAILY AS NEEDED FOR MUSCLE SPASMS 60 tablet 0  . dorzolamide-timolol (COSOPT) 22.3-6.8 MG/ML ophthalmic solution Place 1 drop into both eyes 2 (two) times daily.     . ferrous sulfate 325 (65 FE) MG tablet Take 1 tablet (325 mg total) by mouth daily with breakfast. 30 tablet 11  . furosemide (LASIX) 20 MG tablet Take 1 tablet (20 mg total) by mouth daily. 90 tablet 1  . gabapentin (NEURONTIN) 300 MG capsule TAKE ONE CAPSULE BY MOUTH THREE TIMES DAILY 270 capsule 0  . insulin aspart (NOVOLOG FLEXPEN) 100 UNIT/ML FlexPen Inject 12 Units into the skin 3 (three) times daily with meals. 15 mL 3  . Insulin Glargine (LANTUS SOLOSTAR)  100 UNIT/ML Solostar Pen Inject 44 Units into the skin at bedtime. 45 mL 5  . Insulin Pen Needle (B-D UF III MINI PEN NEEDLES) 31G X 5 MM MISC Use to inject insulin 4-5 times daily 200 each 5  . IRON PO Take 1 tablet by mouth 3 (three) times daily.    Marland Kitchen latanoprost (XALATAN) 0.005 % ophthalmic solution Place 1 drop into both eyes at bedtime. Reported on 10/29/2015  4  . lisinopril (PRINIVIL,ZESTRIL) 20 MG tablet Take 1 tablet (20 mg total) by mouth daily. 90 tablet 3  . metFORMIN (GLUCOPHAGE) 1000 MG tablet TAKE 1 TABLET TWICE DAILY WITH A MEAL 180 tablet 0  . omeprazole (PRILOSEC) 20 MG capsule Take 1 capsule (20 mg total) by mouth 2 (two) times daily before a meal. 20 capsule 0  . ONETOUCH DELICA LANCETS FINE MISC Check blood sugar as instructed up to 3 times a day 100 each 12  . ONETOUCH VERIO test strip USE TO CHECK BLOOD SUGAR UP TO THREE TIMES DAILY AS DIRECTED 3000 each 12  . oxyCODONE-acetaminophen (PERCOCET/ROXICET) 5-325 MG tablet Take 1 tablet by mouth every 8 (eight) hours as needed for severe pain. 90 tablet 0  . pantoprazole (PROTONIX) 40 MG tablet Take 1 tablet (40 mg total) by mouth daily. 90 tablet 3  . polyethylene glycol (MIRALAX / GLYCOLAX) packet Take 17 g by mouth daily. 14 each 0  . pravastatin (PRAVACHOL) 20 MG tablet Take 1 tablet (20 mg total)  by mouth daily. (Patient taking differently: Take 20 mg by mouth every evening. ) 30 tablet 3  . ranitidine (ZANTAC) 150 MG capsule Take 1 capsule (150 mg total) by mouth daily. 30 capsule 0  . senna (SENOKOT) 8.6 MG TABS tablet Take 2 tablets (17.2 mg total) by mouth daily. 120 each 0  . sulfamethoxazole-trimethoprim (BACTRIM DS,SEPTRA DS) 800-160 MG tablet Take 1 tablet by mouth 2 (two) times daily. 10 tablet 0   No current facility-administered medications for this visit.     No Known Allergies  Social History   Social History  . Marital status: Single    Spouse name: N/A  . Number of children: 2  . Years of education:  12th grade   Occupational History  . 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    Social History Main Topics  . Smoking status: Former Smoker    Packs/day: 0.30    Years: 7.00    Types: Cigarettes    Quit date: 06/27/2006  . Smokeless tobacco: Never Used  . Alcohol use No  . Drug use: No  . Sexual activity: Not Currently   Other Topics Concern  . Not on file   Social History Narrative   Lives with her kids, 76yo daughter and 66 yo adopted son.     Review of Systems: General: negative for chills, fever, night sweats or weight changes.  Cardiovascular: negative for chest pain, dyspnea on exertion, edema, orthopnea, palpitations, paroxysmal nocturnal dyspnea or shortness of breath Dermatological: negative for rash Respiratory: negative for cough or wheezing Urologic: negative for hematuria Abdominal: negative for nausea, vomiting, diarrhea, bright red blood per rectum, melena, or hematemesis Neurologic: negative for visual changes, syncope, or dizziness All other systems reviewed and are otherwise negative except as noted above.    Blood pressure (!) 152/80, pulse (!) 106, height 5\' 6"  (1.676 m), weight 258 lb 6.4 oz (117.2 kg), last menstrual period 02/13/2011, SpO2 97 %.  General appearance: alert and no distress Neck: no adenopathy, no carotid bruit, no JVD, supple, symmetrical, trachea midline and thyroid not enlarged, symmetric, no tenderness/mass/nodules Lungs: clear to auscultation bilaterally Heart: regular rate and rhythm, S1, S2 normal, no murmur, click, rub or gallop Extremities: Right third toe ulcer, 2+ pedal pulses on the right  EKG not performed today  ASSESSMENT AND PLAN:   Right foot ulcer ([place]) [name] was referred by Dr. Barkley Bruns, her podiatrist, for evaluation of a right third toe ulcer. This occurred approximately 2 months ago after removal of the toenail. There is been slowly  healing. She has been on antibiotics. Toe is somewhat discolored. She denies claudication. She has been a diabetic for over 30 years. I can't palpate pedal pulses. I suspect this is related to small vessel disease. I am going to get lower extremity arterial Doppler studies to further evaluate her peripheral circulation.      Lorretta Harp MD [place], [facility] 10/26/2016 4:38 PM

## 2016-10-26 NOTE — Patient Instructions (Signed)
Medication Instructions: Your physician recommends that you continue on your current medications as directed. Please refer to the Current Medication list given to you today.  Testing/Procedures: Your physician has requested that you have a lower extremity arterial duplex. During this test, ultrasound is used to evaluate arterial blood flow in the legs. Allow one hour for this exam. There are no restrictions or special instructions.  Your physician has requested that you have an ankle brachial index (ABI). During this test an ultrasound and blood pressure cuff are used to evaluate the arteries that supply the arms and legs with blood. Allow thirty minutes for this exam. There are no restrictions or special instructions.  Follow-Up: Please schedule a follow-up appointment with Dr. Burt Knack.  If you need a refill on your cardiac medications before your next appointment, please call your pharmacy.

## 2016-10-26 NOTE — Assessment & Plan Note (Signed)
[name] was referred by Dr. Barkley Bruns, her podiatrist, for evaluation of a right third toe ulcer. This occurred approximately 2 months ago after removal of the toenail. There is been slowly healing. She has been on antibiotics. Toe is somewhat discolored. She denies claudication. She has been a diabetic for over 30 years. I can't palpate pedal pulses. I suspect this is related to small vessel disease. I am going to get lower extremity arterial Doppler studies to further evaluate her peripheral circulation.

## 2016-10-27 DIAGNOSIS — G609 Hereditary and idiopathic neuropathy, unspecified: Secondary | ICD-10-CM | POA: Diagnosis not present

## 2016-10-28 ENCOUNTER — Ambulatory Visit (INDEPENDENT_AMBULATORY_CARE_PROVIDER_SITE_OTHER): Payer: Medicare HMO | Admitting: Internal Medicine

## 2016-10-28 VITALS — BP 126/64 | HR 92 | Ht 66.0 in | Wt 262.5 lb

## 2016-10-28 DIAGNOSIS — Z79899 Other long term (current) drug therapy: Secondary | ICD-10-CM

## 2016-10-28 DIAGNOSIS — I2510 Atherosclerotic heart disease of native coronary artery without angina pectoris: Secondary | ICD-10-CM | POA: Diagnosis not present

## 2016-10-28 DIAGNOSIS — L97511 Non-pressure chronic ulcer of other part of right foot limited to breakdown of skin: Secondary | ICD-10-CM | POA: Diagnosis not present

## 2016-10-28 DIAGNOSIS — Z6841 Body Mass Index (BMI) 40.0 and over, adult: Secondary | ICD-10-CM

## 2016-10-28 DIAGNOSIS — Z87891 Personal history of nicotine dependence: Secondary | ICD-10-CM | POA: Diagnosis not present

## 2016-10-28 DIAGNOSIS — K5903 Drug induced constipation: Secondary | ICD-10-CM

## 2016-10-28 DIAGNOSIS — Z79891 Long term (current) use of opiate analgesic: Secondary | ICD-10-CM | POA: Diagnosis not present

## 2016-10-28 DIAGNOSIS — G609 Hereditary and idiopathic neuropathy, unspecified: Secondary | ICD-10-CM | POA: Diagnosis not present

## 2016-10-28 DIAGNOSIS — M2040 Other hammer toe(s) (acquired), unspecified foot: Secondary | ICD-10-CM | POA: Diagnosis not present

## 2016-10-28 DIAGNOSIS — E11621 Type 2 diabetes mellitus with foot ulcer: Secondary | ICD-10-CM

## 2016-10-28 DIAGNOSIS — L600 Ingrowing nail: Secondary | ICD-10-CM

## 2016-10-28 DIAGNOSIS — M4802 Spinal stenosis, cervical region: Secondary | ICD-10-CM

## 2016-10-28 DIAGNOSIS — L97519 Non-pressure chronic ulcer of other part of right foot with unspecified severity: Secondary | ICD-10-CM

## 2016-10-28 DIAGNOSIS — G8929 Other chronic pain: Secondary | ICD-10-CM | POA: Diagnosis not present

## 2016-10-28 DIAGNOSIS — K5900 Constipation, unspecified: Secondary | ICD-10-CM

## 2016-10-28 DIAGNOSIS — L97512 Non-pressure chronic ulcer of other part of right foot with fat layer exposed: Secondary | ICD-10-CM | POA: Diagnosis not present

## 2016-10-28 DIAGNOSIS — M2041 Other hammer toe(s) (acquired), right foot: Secondary | ICD-10-CM | POA: Diagnosis not present

## 2016-10-28 DIAGNOSIS — I10 Essential (primary) hypertension: Secondary | ICD-10-CM | POA: Diagnosis not present

## 2016-10-28 MED ORDER — GABAPENTIN 300 MG PO CAPS: 300.0000 mg | ORAL_CAPSULE | Freq: Three times a day (TID) | ORAL | 1 refills | Status: DC

## 2016-10-28 MED ORDER — OXYCODONE-ACETAMINOPHEN 5-325 MG PO TABS: 1.0000 | ORAL_TABLET | Freq: Three times a day (TID) | ORAL | 0 refills | Status: DC | PRN

## 2016-10-28 MED ORDER — PRAVASTATIN SODIUM 20 MG PO TABS: 20.0000 mg | ORAL_TABLET | Freq: Every evening | ORAL | 2 refills | Status: DC

## 2016-10-28 MED ORDER — OXYCODONE-ACETAMINOPHEN 5-325 MG PO TABS
1.0000 | ORAL_TABLET | Freq: Three times a day (TID) | ORAL | 0 refills | Status: DC | PRN
Start: 2016-10-28 — End: 2016-10-28

## 2016-10-28 NOTE — Progress Notes (Signed)
   CC: Follow up for diabetic foot ulcer  HPI:  [name] is a 56 y.o. woman here today in follow-up from her podiatrist's office for poorly healing ulcer on the right foot.   See problem based assessment and plan below for additional details  Past Medical History:  Diagnosis Date  . Adhesive capsulitis of left shoulder    Steroid injection Dr. Truman Hayward 1/12  . Adhesive capsulitis of right shoulder    Steroid injection Dr. Truman Hayward 1/12  . Adhesive capsulitis of shoulder    bilateral, Steroid injection Dr. Truman Hayward 1/12 bilaterally  . CAD (coronary artery disease)    nonobstructive. Last cardiac cath (2008) showing left circumflex with mid 50% stenosis and distal luminal irregularities. Also with RCA with mid to distal 30-40% stenosis. // Previously evaluated by [facility], never followed up outpatient.  Marland Kitchen CAP (community acquired pneumonia) 06/15/2012   05/2012 CXR: Mild opacification of the posterior lung base on the lateral film  as cannot exclude infection/atelectasis.    . CHF (congestive heart failure) ([place])   . Diabetes mellitus 2007   Type II, insulin dependent  . Diabetic retinopathy   . GERD (gastroesophageal reflux disease)   . Glaucoma   . Hyperlipidemia   . Hypertension   . Obesity   . Peripheral neuropathy     Review of Systems:  Review of Systems  Constitutional: Negative for chills and fever.  Cardiovascular: Negative for leg swelling.  Genitourinary: Negative for frequency.  Musculoskeletal: Positive for back pain and joint pain. Negative for falls.  Neurological: Negative for dizziness.  Endo/Heme/Allergies: Negative for polydipsia.    Physical Exam: Physical Exam  Constitutional:  Morbidly obese woman in no acute distress  Neck:  Anterior and posterior surgical scars appear well healed  Cardiovascular: Normal rate and regular rhythm.   Pulmonary/Chest: Effort normal and breath sounds normal.  Musculoskeletal:  Right foot has a clean based  ulcer on the medial side of her 3rd toe. There is no surround erythema or other significant callous or ulcers. The toenails on 1st-3rd digits are severely eroded and irregular  Skin: Skin is warm and dry.    Vitals:   10/28/16 1602 10/28/16 1646  BP: (!) 148/57 126/64  Pulse: (!) 102 92  Weight: 262 lb 8 oz (119.1 kg)   Height: 5\' 6"  (1.676 m)     Assessment & Plan:   See Encounters Tab for problem based charting.  Patient discussed with Dr. Daryll Drown

## 2016-10-28 NOTE — Patient Instructions (Addendum)
It was a pleasure to see you today [name].  I am glad you are seeing the foot doctor and heart doctor to evaluate this poorly healing wound on your toe. I will fill out the paperwork regarding diabetic footwear so hopefully that will be a good option to limit foot injuries in the future.  I would like to see you again in June with your glucometer so we can adjust your diabetes medications to hopefully prevent more foot problems.  I also sent refills for you pain medication, cholesterol medication, and gabapentin.

## 2016-10-29 DIAGNOSIS — G609 Hereditary and idiopathic neuropathy, unspecified: Secondary | ICD-10-CM | POA: Diagnosis not present

## 2016-10-30 DIAGNOSIS — G609 Hereditary and idiopathic neuropathy, unspecified: Secondary | ICD-10-CM | POA: Diagnosis not present

## 2016-10-31 DIAGNOSIS — G609 Hereditary and idiopathic neuropathy, unspecified: Secondary | ICD-10-CM | POA: Diagnosis not present

## 2016-10-31 NOTE — Assessment & Plan Note (Addendum)
BP Readings from Last 3 Encounters:  10/28/16 126/64  10/26/16 (!) 152/80  09/30/16 (!) 149/58    Assessment: Blood pressure control: Well controlled today Progress toward BP goal: Remains controlled Comments: Her blood pressure is reasonably well controlled and does not need any change to management at this time  Plan: Medications:  continue current medications  Other plans: Repeat serum chemistry panel at next follow up in end of June or July

## 2016-10-31 NOTE — Assessment & Plan Note (Signed)
Reordered senna 8.6mg  x2 tablets daily

## 2016-10-31 NOTE — Assessment & Plan Note (Signed)
Reordered pravastatin 20mg  qHS

## 2016-10-31 NOTE — Assessment & Plan Note (Signed)
HPI: She was recently seen by podiatry as well as vascular surgery for dry ulcer on the medial right third toe with epinephrine's mentation for removal of ingrown inflamed toenail. Assessment slowly healing and responded to oral antibiotics with no surrounding erythema or inflammation. She is referred for ABIs to assess for peripheral arterial disease. She does not have palpable dorsalis pedis pulses but unclear how much larger for small vessel disease involved. She is a long-standing poorly controlled diabetic.  A: On exam this appears to be a poorly healing skin ulcer without evidence of inflammation or infection I do think she would benefit from diabetic footwear  P: Will complete paperwork referral for diabetic footwear Agree with testing for peripheral arterial disease

## 2016-10-31 NOTE — Assessment & Plan Note (Signed)
[name] is on chronic opioid therapy for chronic pain. The date of the controlled substances contract is referenced in the [place] and / or the overview. Date of pain contract was 07/28/2014. As part of the treatment plan, the [place] controlled substance database is checked at least twice yearly and the database results are appropriate. I have last reviewed the results on 10/28/16.   The last UDS was on 03/11/16 and results are as expected. Patient needs at least a yearly UDS.   The patient is on oxycodone/acetaminophen (Percocet, Tylox) strength 5mg -325mg , 90 per 30 days. Adjunctive treatment includes NSAID's and Muscle relaxants. This regimen allows [name] to function and does not cause excessive sedation or other side effects.  "The benefits of continuing opioid therapy outweigh the risks and chronic opioids will be continued. Ongoing education about safe opioid treatment is provided  Interventions today include: Refills - 3 paper Rx printed

## 2016-11-01 DIAGNOSIS — G609 Hereditary and idiopathic neuropathy, unspecified: Secondary | ICD-10-CM | POA: Diagnosis not present

## 2016-11-02 DIAGNOSIS — G609 Hereditary and idiopathic neuropathy, unspecified: Secondary | ICD-10-CM | POA: Diagnosis not present

## 2016-11-03 DIAGNOSIS — G609 Hereditary and idiopathic neuropathy, unspecified: Secondary | ICD-10-CM | POA: Diagnosis not present

## 2016-11-03 NOTE — Progress Notes (Signed)
Internal Medicine Clinic Attending  Case discussed with Dr. Rice soon after the resident saw the patient.  We reviewed the resident's history and exam and pertinent patient test results.  I agree with the assessment, diagnosis, and plan of care documented in the resident's note. 

## 2016-11-04 DIAGNOSIS — G609 Hereditary and idiopathic neuropathy, unspecified: Secondary | ICD-10-CM | POA: Diagnosis not present

## 2016-11-05 DIAGNOSIS — G609 Hereditary and idiopathic neuropathy, unspecified: Secondary | ICD-10-CM | POA: Diagnosis not present

## 2016-11-06 DIAGNOSIS — G609 Hereditary and idiopathic neuropathy, unspecified: Secondary | ICD-10-CM | POA: Diagnosis not present

## 2016-11-07 DIAGNOSIS — G609 Hereditary and idiopathic neuropathy, unspecified: Secondary | ICD-10-CM | POA: Diagnosis not present

## 2016-11-08 DIAGNOSIS — G609 Hereditary and idiopathic neuropathy, unspecified: Secondary | ICD-10-CM | POA: Diagnosis not present

## 2016-11-09 DIAGNOSIS — G609 Hereditary and idiopathic neuropathy, unspecified: Secondary | ICD-10-CM | POA: Diagnosis not present

## 2016-11-10 DIAGNOSIS — G609 Hereditary and idiopathic neuropathy, unspecified: Secondary | ICD-10-CM | POA: Diagnosis not present

## 2016-11-11 ENCOUNTER — Ambulatory Visit (HOSPITAL_COMMUNITY)
Admission: RE | Admit: 2016-11-11 | Discharge: 2016-11-11 | Disposition: A | Discharge status: Home / Self Care | Payer: Medicare HMO | Source: Ambulatory Visit | Attending: Internal Medicine | Admitting: Internal Medicine

## 2016-11-11 DIAGNOSIS — E11621 Type 2 diabetes mellitus with foot ulcer: Secondary | ICD-10-CM | POA: Insufficient documentation

## 2016-11-11 DIAGNOSIS — I10 Essential (primary) hypertension: Secondary | ICD-10-CM | POA: Diagnosis not present

## 2016-11-11 DIAGNOSIS — L97519 Non-pressure chronic ulcer of other part of right foot with unspecified severity: Secondary | ICD-10-CM

## 2016-11-11 DIAGNOSIS — I2510 Atherosclerotic heart disease of native coronary artery without angina pectoris: Secondary | ICD-10-CM | POA: Diagnosis not present

## 2016-11-11 DIAGNOSIS — E1140 Type 2 diabetes mellitus with diabetic neuropathy, unspecified: Secondary | ICD-10-CM | POA: Diagnosis not present

## 2016-11-11 DIAGNOSIS — I998 Other disorder of circulatory system: Secondary | ICD-10-CM | POA: Insufficient documentation

## 2016-11-11 DIAGNOSIS — E785 Hyperlipidemia, unspecified: Secondary | ICD-10-CM | POA: Diagnosis not present

## 2016-11-11 DIAGNOSIS — G609 Hereditary and idiopathic neuropathy, unspecified: Secondary | ICD-10-CM | POA: Diagnosis not present

## 2016-11-11 DIAGNOSIS — I70229 Atherosclerosis of native arteries of extremities with rest pain, unspecified extremity: Secondary | ICD-10-CM

## 2016-11-12 DIAGNOSIS — G609 Hereditary and idiopathic neuropathy, unspecified: Secondary | ICD-10-CM | POA: Diagnosis not present

## 2016-11-13 DIAGNOSIS — G609 Hereditary and idiopathic neuropathy, unspecified: Secondary | ICD-10-CM | POA: Diagnosis not present

## 2016-11-14 DIAGNOSIS — G609 Hereditary and idiopathic neuropathy, unspecified: Secondary | ICD-10-CM | POA: Diagnosis not present

## 2016-11-15 DIAGNOSIS — G609 Hereditary and idiopathic neuropathy, unspecified: Secondary | ICD-10-CM | POA: Diagnosis not present

## 2016-11-15 DIAGNOSIS — Z86010 Personal history of colonic polyps: Secondary | ICD-10-CM | POA: Diagnosis not present

## 2016-11-16 DIAGNOSIS — G609 Hereditary and idiopathic neuropathy, unspecified: Secondary | ICD-10-CM | POA: Diagnosis not present

## 2016-11-17 DIAGNOSIS — G609 Hereditary and idiopathic neuropathy, unspecified: Secondary | ICD-10-CM | POA: Diagnosis not present

## 2016-11-18 DIAGNOSIS — L97512 Non-pressure chronic ulcer of other part of right foot with fat layer exposed: Secondary | ICD-10-CM | POA: Diagnosis not present

## 2016-11-18 DIAGNOSIS — G609 Hereditary and idiopathic neuropathy, unspecified: Secondary | ICD-10-CM | POA: Diagnosis not present

## 2016-11-19 DIAGNOSIS — G609 Hereditary and idiopathic neuropathy, unspecified: Secondary | ICD-10-CM | POA: Diagnosis not present

## 2016-11-20 DIAGNOSIS — G609 Hereditary and idiopathic neuropathy, unspecified: Secondary | ICD-10-CM | POA: Diagnosis not present

## 2016-11-21 DIAGNOSIS — G609 Hereditary and idiopathic neuropathy, unspecified: Secondary | ICD-10-CM | POA: Diagnosis not present

## 2016-11-22 DIAGNOSIS — G609 Hereditary and idiopathic neuropathy, unspecified: Secondary | ICD-10-CM | POA: Diagnosis not present

## 2016-11-23 DIAGNOSIS — E1042 Type 1 diabetes mellitus with diabetic polyneuropathy: Secondary | ICD-10-CM | POA: Diagnosis not present

## 2016-11-23 DIAGNOSIS — G609 Hereditary and idiopathic neuropathy, unspecified: Secondary | ICD-10-CM | POA: Diagnosis not present

## 2016-11-24 DIAGNOSIS — G609 Hereditary and idiopathic neuropathy, unspecified: Secondary | ICD-10-CM | POA: Diagnosis not present

## 2016-11-25 DIAGNOSIS — G609 Hereditary and idiopathic neuropathy, unspecified: Secondary | ICD-10-CM | POA: Diagnosis not present

## 2016-11-26 DIAGNOSIS — G609 Hereditary and idiopathic neuropathy, unspecified: Secondary | ICD-10-CM | POA: Diagnosis not present

## 2016-11-27 DIAGNOSIS — G609 Hereditary and idiopathic neuropathy, unspecified: Secondary | ICD-10-CM | POA: Diagnosis not present

## 2016-11-28 DIAGNOSIS — G609 Hereditary and idiopathic neuropathy, unspecified: Secondary | ICD-10-CM | POA: Diagnosis not present

## 2016-11-29 DIAGNOSIS — G609 Hereditary and idiopathic neuropathy, unspecified: Secondary | ICD-10-CM | POA: Diagnosis not present

## 2016-11-30 ENCOUNTER — Other Ambulatory Visit: Payer: Self-pay | Admitting: Internal Medicine

## 2016-11-30 DIAGNOSIS — I10 Essential (primary) hypertension: Secondary | ICD-10-CM

## 2016-11-30 DIAGNOSIS — G609 Hereditary and idiopathic neuropathy, unspecified: Secondary | ICD-10-CM | POA: Diagnosis not present

## 2016-12-01 DIAGNOSIS — G609 Hereditary and idiopathic neuropathy, unspecified: Secondary | ICD-10-CM | POA: Diagnosis not present

## 2016-12-02 DIAGNOSIS — G609 Hereditary and idiopathic neuropathy, unspecified: Secondary | ICD-10-CM | POA: Diagnosis not present

## 2016-12-03 ENCOUNTER — Other Ambulatory Visit: Payer: Self-pay | Admitting: Internal Medicine

## 2016-12-03 DIAGNOSIS — G609 Hereditary and idiopathic neuropathy, unspecified: Secondary | ICD-10-CM | POA: Diagnosis not present

## 2016-12-03 DIAGNOSIS — M542 Cervicalgia: Secondary | ICD-10-CM

## 2016-12-04 DIAGNOSIS — G609 Hereditary and idiopathic neuropathy, unspecified: Secondary | ICD-10-CM | POA: Diagnosis not present

## 2016-12-05 DIAGNOSIS — G609 Hereditary and idiopathic neuropathy, unspecified: Secondary | ICD-10-CM | POA: Diagnosis not present

## 2016-12-06 DIAGNOSIS — G609 Hereditary and idiopathic neuropathy, unspecified: Secondary | ICD-10-CM | POA: Diagnosis not present

## 2016-12-07 ENCOUNTER — Other Ambulatory Visit: Payer: Self-pay | Admitting: Internal Medicine

## 2016-12-07 ENCOUNTER — Ambulatory Visit (INDEPENDENT_AMBULATORY_CARE_PROVIDER_SITE_OTHER): Payer: Medicare HMO | Admitting: Internal Medicine

## 2016-12-07 ENCOUNTER — Encounter: Payer: Self-pay | Admitting: Internal Medicine

## 2016-12-07 VITALS — BP 144/85 | HR 98 | Temp 98.1°F | Ht 66.0 in | Wt 266.5 lb

## 2016-12-07 DIAGNOSIS — M25562 Pain in left knee: Secondary | ICD-10-CM

## 2016-12-07 DIAGNOSIS — M25462 Effusion, left knee: Secondary | ICD-10-CM

## 2016-12-07 DIAGNOSIS — M13862 Other specified arthritis, left knee: Secondary | ICD-10-CM

## 2016-12-07 DIAGNOSIS — G609 Hereditary and idiopathic neuropathy, unspecified: Secondary | ICD-10-CM | POA: Diagnosis not present

## 2016-12-07 MED ORDER — IBUPROFEN 600 MG PO TABS: 600.0000 mg | ORAL_TABLET | Freq: Four times a day (QID) | ORAL | 0 refills | Status: DC | PRN

## 2016-12-07 NOTE — Patient Instructions (Signed)
It was a pleasure to see you today [name].  I suspect your knee pain is related to your arthritis and possibly pseudogout. We were unable to get a fluid collection so I do not have a 469% certain diagnosis today. Hopefully the steroid shot is helpful within a few days. In the meantime you may take ibuprofen as needed for the pain.  If this gets better I can see you again next month.  If the pain gets much worse or does not improve at all by next week please let us know sooner.

## 2016-12-07 NOTE — Progress Notes (Signed)
   CC: Left knee pain and swelling for 4 days  HPI:  Ms.Courtenay C Calloway is a 56 y.o. woman here for knee pain that started spontaneously about 4 days ago with some associated swelling, that has not improved with her usual oral opioid pain medications.   See problem based assessment and plan below for additional details  Past Medical History:  Diagnosis Date  . Adhesive capsulitis of left shoulder    Steroid injection Dr. Truman Hayward 1/12  . Adhesive capsulitis of right shoulder    Steroid injection Dr. Truman Hayward 1/12  . Adhesive capsulitis of shoulder    bilateral, Steroid injection Dr. Truman Hayward 1/12 bilaterally  . CAD (coronary artery disease)    nonobstructive. Last cardiac cath (2008) showing left circumflex with mid 50% stenosis and distal luminal irregularities. Also with RCA with mid to distal 30-40% stenosis. // Previously evaluated by [facility], never followed up outpatient.  Marland Kitchen CAP (community acquired pneumonia) 06/15/2012   05/2012 CXR: Mild opacification of the posterior lung base on the lateral film  as cannot exclude infection/atelectasis.    . CHF (congestive heart failure) ([place])   . Diabetes mellitus 2007   Type II, insulin dependent  . Diabetic retinopathy   . GERD (gastroesophageal reflux disease)   . Glaucoma   . Hyperlipidemia   . Hypertension   . Obesity   . Peripheral neuropathy     Review of Systems:  Review of Systems  Constitutional: Negative for chills and fever.  Respiratory: Negative for shortness of breath.   Cardiovascular: Negative for leg swelling.  Musculoskeletal: Positive for joint pain. Negative for back pain, falls and myalgias.  Skin: Negative for rash.  Neurological: Negative for sensory change.    Physical Exam: Physical Exam  Constitutional: She is well-developed, well-nourished, and in no distress.  Cardiovascular: Normal rate and regular rhythm.   Pulmonary/Chest: Effort normal and breath sounds normal.  Musculoskeletal: Normal range of  motion.  Left knee is exquisitely tender to palpation over the superolateral aspect, pain with passive or active range of motion in the knee or with weightbearing, no edema or parasthesias distal to the knee  Skin: Skin is warm and dry. No rash noted.    Vitals:   12/07/16 0853  BP: (!) 144/85  Pulse: 98  Temp: 98.1 F (36.7 C)  TempSrc: Oral  SpO2: 99%  Weight: 266 lb 8 oz (120.9 kg)  Height: 5\' 6"  (1.676 m)    Assessment & Plan:   See Encounters Tab for problem based charting.  Patient discussed with Dr. Dareen Piano

## 2016-12-07 NOTE — Progress Notes (Signed)
Knee Arthrocentesis with Injection Procedure Note  Diagnosis: left knee acute inflammatory arthritis  Indications: Diagnosis for joint effusion, symptom relief of joint pain  Anesthesia: topical tetrafluoroethane spray  Procedure Details   Consent was obtained for the procedure. The joint was prepped with Betadine. A 22 gauge needle was inserted from a superolateral approach to access the suprapatellar pouch. No fluid was able to be aspirated. The joint was subsequently accessed from the inferolateral approach again without appreciable fluid aspirated. 1 ml 1% lidocaine and 1 ml of 40mg /mL Kenalog was then injected into the joint through the same needle. The needle was removed and the area cleansed and dressed.  Complications:  None; patient tolerated the procedure well.  Dr. Daryll Drown was present throughout the procedure in a supervising role.

## 2016-12-08 DIAGNOSIS — G609 Hereditary and idiopathic neuropathy, unspecified: Secondary | ICD-10-CM | POA: Diagnosis not present

## 2016-12-09 DIAGNOSIS — G609 Hereditary and idiopathic neuropathy, unspecified: Secondary | ICD-10-CM | POA: Diagnosis not present

## 2016-12-09 NOTE — Assessment & Plan Note (Signed)
HPI: She has increased pain with motion and weight bearing in the left knee. There is associated swelling particularly on the superolateral aspect of the joint. She has pain chronically from known osteoarthritis but usually without swelling or of this severity. She takes chronic oxycodone for back and knee pain but it is not improving the current symptoms significantly.  A: New left knee inflammatory arthritis. This could be osteoarthritis with effusion, pseudogout at a structurally abnormal joint, suprapatellar bursitis. It seems less likely a soft tissue strain or tendon injury with no preceding trauma or increased use. There is no heat, erythema, systemic symptoms, or skin breaks to suggest infection.  P: Joint aspiration and injection with lidocaine/kenalog today Fluid analysis was not possible due to a dry tap - this certainly suggests against purulence or a large effusion Recommended she can take ibuprofen as well for the next week or two while waiting for symptoms to improve RTC as needed or if her knee becomes much worse

## 2016-12-10 DIAGNOSIS — G609 Hereditary and idiopathic neuropathy, unspecified: Secondary | ICD-10-CM | POA: Diagnosis not present

## 2016-12-11 DIAGNOSIS — G609 Hereditary and idiopathic neuropathy, unspecified: Secondary | ICD-10-CM | POA: Diagnosis not present

## 2016-12-12 DIAGNOSIS — G609 Hereditary and idiopathic neuropathy, unspecified: Secondary | ICD-10-CM | POA: Diagnosis not present

## 2016-12-13 DIAGNOSIS — G609 Hereditary and idiopathic neuropathy, unspecified: Secondary | ICD-10-CM | POA: Diagnosis not present

## 2016-12-14 DIAGNOSIS — G609 Hereditary and idiopathic neuropathy, unspecified: Secondary | ICD-10-CM | POA: Diagnosis not present

## 2016-12-15 DIAGNOSIS — G609 Hereditary and idiopathic neuropathy, unspecified: Secondary | ICD-10-CM | POA: Diagnosis not present

## 2016-12-16 DIAGNOSIS — G609 Hereditary and idiopathic neuropathy, unspecified: Secondary | ICD-10-CM | POA: Diagnosis not present

## 2016-12-17 DIAGNOSIS — G609 Hereditary and idiopathic neuropathy, unspecified: Secondary | ICD-10-CM | POA: Diagnosis not present

## 2016-12-18 DIAGNOSIS — G609 Hereditary and idiopathic neuropathy, unspecified: Secondary | ICD-10-CM | POA: Diagnosis not present

## 2016-12-19 DIAGNOSIS — G609 Hereditary and idiopathic neuropathy, unspecified: Secondary | ICD-10-CM | POA: Diagnosis not present

## 2016-12-19 NOTE — Progress Notes (Signed)
Internal Medicine Clinic Attending  Case discussed with Dr. Rice at the time of the visit.  We reviewed the resident's history and exam and pertinent patient test results.  I agree with the assessment, diagnosis, and plan of care documented in the resident's note.  

## 2016-12-20 DIAGNOSIS — G609 Hereditary and idiopathic neuropathy, unspecified: Secondary | ICD-10-CM | POA: Diagnosis not present

## 2016-12-21 DIAGNOSIS — G609 Hereditary and idiopathic neuropathy, unspecified: Secondary | ICD-10-CM | POA: Diagnosis not present

## 2016-12-22 DIAGNOSIS — G609 Hereditary and idiopathic neuropathy, unspecified: Secondary | ICD-10-CM | POA: Diagnosis not present

## 2016-12-23 DIAGNOSIS — L97512 Non-pressure chronic ulcer of other part of right foot with fat layer exposed: Secondary | ICD-10-CM | POA: Diagnosis not present

## 2016-12-23 DIAGNOSIS — G609 Hereditary and idiopathic neuropathy, unspecified: Secondary | ICD-10-CM | POA: Diagnosis not present

## 2016-12-24 DIAGNOSIS — G609 Hereditary and idiopathic neuropathy, unspecified: Secondary | ICD-10-CM | POA: Diagnosis not present

## 2016-12-25 DIAGNOSIS — G609 Hereditary and idiopathic neuropathy, unspecified: Secondary | ICD-10-CM | POA: Diagnosis not present

## 2016-12-26 DIAGNOSIS — G609 Hereditary and idiopathic neuropathy, unspecified: Secondary | ICD-10-CM | POA: Diagnosis not present

## 2016-12-27 DIAGNOSIS — G609 Hereditary and idiopathic neuropathy, unspecified: Secondary | ICD-10-CM | POA: Diagnosis not present

## 2016-12-28 DIAGNOSIS — G609 Hereditary and idiopathic neuropathy, unspecified: Secondary | ICD-10-CM | POA: Diagnosis not present

## 2016-12-29 DIAGNOSIS — G609 Hereditary and idiopathic neuropathy, unspecified: Secondary | ICD-10-CM | POA: Diagnosis not present

## 2016-12-30 DIAGNOSIS — G609 Hereditary and idiopathic neuropathy, unspecified: Secondary | ICD-10-CM | POA: Diagnosis not present

## 2016-12-31 DIAGNOSIS — G609 Hereditary and idiopathic neuropathy, unspecified: Secondary | ICD-10-CM | POA: Diagnosis not present

## 2017-01-01 DIAGNOSIS — G609 Hereditary and idiopathic neuropathy, unspecified: Secondary | ICD-10-CM | POA: Diagnosis not present

## 2017-01-02 DIAGNOSIS — G609 Hereditary and idiopathic neuropathy, unspecified: Secondary | ICD-10-CM | POA: Diagnosis not present

## 2017-01-03 DIAGNOSIS — G609 Hereditary and idiopathic neuropathy, unspecified: Secondary | ICD-10-CM | POA: Diagnosis not present

## 2017-01-04 DIAGNOSIS — G609 Hereditary and idiopathic neuropathy, unspecified: Secondary | ICD-10-CM | POA: Diagnosis not present

## 2017-01-05 DIAGNOSIS — G609 Hereditary and idiopathic neuropathy, unspecified: Secondary | ICD-10-CM | POA: Diagnosis not present

## 2017-01-06 ENCOUNTER — Encounter: Payer: Self-pay | Admitting: Internal Medicine

## 2017-01-06 ENCOUNTER — Ambulatory Visit (INDEPENDENT_AMBULATORY_CARE_PROVIDER_SITE_OTHER): Payer: Medicare HMO | Admitting: Internal Medicine

## 2017-01-06 VITALS — BP 138/64 | HR 86 | Temp 98.4°F | Ht 66.0 in | Wt 264.6 lb

## 2017-01-06 DIAGNOSIS — Z79899 Other long term (current) drug therapy: Secondary | ICD-10-CM

## 2017-01-06 DIAGNOSIS — Z794 Long term (current) use of insulin: Secondary | ICD-10-CM | POA: Diagnosis not present

## 2017-01-06 DIAGNOSIS — Z87891 Personal history of nicotine dependence: Secondary | ICD-10-CM

## 2017-01-06 DIAGNOSIS — E1140 Type 2 diabetes mellitus with diabetic neuropathy, unspecified: Secondary | ICD-10-CM | POA: Diagnosis not present

## 2017-01-06 DIAGNOSIS — M25562 Pain in left knee: Secondary | ICD-10-CM

## 2017-01-06 DIAGNOSIS — M25462 Effusion, left knee: Secondary | ICD-10-CM | POA: Diagnosis not present

## 2017-01-06 DIAGNOSIS — E1165 Type 2 diabetes mellitus with hyperglycemia: Secondary | ICD-10-CM

## 2017-01-06 DIAGNOSIS — Z8249 Family history of ischemic heart disease and other diseases of the circulatory system: Secondary | ICD-10-CM

## 2017-01-06 DIAGNOSIS — IMO0002 Reserved for concepts with insufficient information to code with codable children: Secondary | ICD-10-CM

## 2017-01-06 DIAGNOSIS — E1121 Type 2 diabetes mellitus with diabetic nephropathy: Secondary | ICD-10-CM

## 2017-01-06 DIAGNOSIS — G609 Hereditary and idiopathic neuropathy, unspecified: Secondary | ICD-10-CM | POA: Diagnosis not present

## 2017-01-06 DIAGNOSIS — I10 Essential (primary) hypertension: Secondary | ICD-10-CM | POA: Diagnosis not present

## 2017-01-06 LAB — GLUCOSE, CAPILLARY: Glucose-Capillary: 255 mg/dL — ABNORMAL HIGH (ref 65–99)

## 2017-01-06 LAB — POCT GLYCOSYLATED HEMOGLOBIN (HGB A1C): Hemoglobin A1C: 9.1

## 2017-01-06 MED ORDER — LIRAGLUTIDE 18 MG/3ML ~~LOC~~ SOPN: 0.6000 mg | PEN_INJECTOR | Freq: Every day | SUBCUTANEOUS | 0 refills | Status: DC

## 2017-01-06 NOTE — Progress Notes (Signed)
   CC: Follow up for diabetes and chronic pain  HPI:  [name] is a 56 y.o. woman here for follow up for chronic back and neck pain for which she has an appointment with her surgeon's office on 7/30. She is also for 3 month diabetes follow up.   See problem based assessment and plan below for additional details  Past Medical History:  Diagnosis Date  . Adhesive capsulitis of left shoulder    Steroid injection Dr. Truman Hayward 1/12  . Adhesive capsulitis of right shoulder    Steroid injection Dr. Truman Hayward 1/12  . Adhesive capsulitis of shoulder    bilateral, Steroid injection Dr. Truman Hayward 1/12 bilaterally  . CAD (coronary artery disease)    nonobstructive. Last cardiac cath (2008) showing left circumflex with mid 50% stenosis and distal luminal irregularities. Also with RCA with mid to distal 30-40% stenosis. // Previously evaluated by [facility], never followed up outpatient.  Marland Kitchen CAP (community acquired pneumonia) 06/15/2012   05/2012 CXR: Mild opacification of the posterior lung base on the lateral film  as cannot exclude infection/atelectasis.    . CHF (congestive heart failure) ([place])   . Diabetes mellitus 2007   Type II, insulin dependent  . Diabetic retinopathy   . GERD (gastroesophageal reflux disease)   . Glaucoma   . Hyperlipidemia   . Hypertension   . Obesity   . Peripheral neuropathy     Review of Systems:  Review of Systems  Eyes: Negative for blurred vision.  Cardiovascular: Negative for leg swelling.  Musculoskeletal: Positive for back pain and neck pain. Negative for falls.  Neurological: Negative for dizziness, focal weakness and headaches.  Endo/Heme/Allergies: Negative for polydipsia.    Physical Exam: Physical Exam  Constitutional:  Obese woman in no acute distress  Neck:  Well healed surgical scars on anterior and posterior neck Fair ROM without pain, muscular tenderness to palpation over trapezius muscles and rhomboids bilaterally  Cardiovascular:  Normal rate and regular rhythm.   Pulmonary/Chest: Effort normal and breath sounds normal.  Musculoskeletal: She exhibits no edema.    Vitals:   01/06/17 1522 01/06/17 1615  BP: (!) 148/63 138/64  Pulse: 92 86  Temp: 98.4 F (36.9 C)   TempSrc: Oral   SpO2: 98%   Weight: 264 lb 9.6 oz (120 kg)   Height: 5\' 6"  (1.676 m)     Assessment & Plan:   See Encounters Tab for problem based charting.  Patient discussed with Dr. Angelia Mould

## 2017-01-06 NOTE — Patient Instructions (Signed)
It was a pleasure to see you today [name].  I am glad you have plans to see your surgeon later this month and have your neck pain rechecked. In the meantime you may try taking the ibuprofen 600mg  up to every 6 hours as needed for your increased pain.  I have prescribed a new medicine for your diabetes. This is liragutide (Victoza) which is a once per day injection. Please call us back if you have any problems getting this medication from the pharamacy or it is not reasonably covered by your insurance.  I would like to see you again in late August or early September if possible to see how you are doing with the medication change and to follow up healthcare maintenance such as mammogram and pap smear if you are feeling a bit better.

## 2017-01-07 ENCOUNTER — Other Ambulatory Visit: Payer: Self-pay | Admitting: Internal Medicine

## 2017-01-07 DIAGNOSIS — M542 Cervicalgia: Secondary | ICD-10-CM

## 2017-01-07 DIAGNOSIS — G609 Hereditary and idiopathic neuropathy, unspecified: Secondary | ICD-10-CM | POA: Diagnosis not present

## 2017-01-08 DIAGNOSIS — G609 Hereditary and idiopathic neuropathy, unspecified: Secondary | ICD-10-CM | POA: Diagnosis not present

## 2017-01-09 DIAGNOSIS — G609 Hereditary and idiopathic neuropathy, unspecified: Secondary | ICD-10-CM | POA: Diagnosis not present

## 2017-01-09 NOTE — Assessment & Plan Note (Signed)
BP Readings from Last 3 Encounters:  01/06/17 138/64  12/07/16 (!) 144/85  10/28/16 126/64    Lab Results  Component Value Date   NA 138 09/23/2016   K 4.1 09/23/2016   CREATININE 0.90 09/23/2016    Assessment: Blood pressure control: Controlled Progress toward BP goal:  Unchanged Comments: Blood pressure is reasonably although not excellently controlled.  Plan: Medications:  Coreg 6.25mg  BID, lisinopril 20mg , lasix 20mg  daily Other plans: continue current medications

## 2017-01-09 NOTE — Assessment & Plan Note (Signed)
Her knee pain and swelling is fully resolved today after steroid injection on 12/07/16.

## 2017-01-09 NOTE — Assessment & Plan Note (Addendum)
Lab Results  Component Value Date   HGBA1C 9.1 01/06/2017   HGBA1C 9.4 09/30/2016   HGBA1C 9.0 05/27/2016     Assessment: Diabetes control: Uncontrolled Progress toward A1C goal:  Unchanged Comments: She has been less controlled for the past year, ever since starting steroids for spinal stenosis symptoms and subsequent surgery. Currently on metformin 1000mg  BID and insulin, but has been hypoglycemic with higher doses of insulin or sulfonylureas. She would be a good candidate for GLP-1 agonist therapy considering her CAD and HFrEF, obesity, and currently uncontrolled diabetes on metformin and insulin. She did not bring her meter today. There are no obvious symptoms of hyperglycemia on review of systems.  Plan: Medications:  Continue metformin 1000mg  BID, insulin lantus 45U, insulin aspart 12U TID, and start liraglutide at 0.6mg  daily Home glucose monitoring: TIDAC if able Instruction/counseling given: reminded to get eye exam and reminded to bring blood glucose meter & log to each visit Educational resources provided:   Self management tools provided:   Other plans: F/U at ~16month for recheck, then can resume q24mo

## 2017-01-10 DIAGNOSIS — G609 Hereditary and idiopathic neuropathy, unspecified: Secondary | ICD-10-CM | POA: Diagnosis not present

## 2017-01-10 NOTE — Progress Notes (Signed)
Internal Medicine Clinic Attending  Case discussed with Dr. Rice at the time of the visit.  We reviewed the resident's history and exam and pertinent patient test results.  I agree with the assessment, diagnosis, and plan of care documented in the resident's note.  

## 2017-01-11 DIAGNOSIS — G609 Hereditary and idiopathic neuropathy, unspecified: Secondary | ICD-10-CM | POA: Diagnosis not present

## 2017-01-12 DIAGNOSIS — G609 Hereditary and idiopathic neuropathy, unspecified: Secondary | ICD-10-CM | POA: Diagnosis not present

## 2017-01-13 DIAGNOSIS — G609 Hereditary and idiopathic neuropathy, unspecified: Secondary | ICD-10-CM | POA: Diagnosis not present

## 2017-01-14 DIAGNOSIS — G609 Hereditary and idiopathic neuropathy, unspecified: Secondary | ICD-10-CM | POA: Diagnosis not present

## 2017-01-15 DIAGNOSIS — G609 Hereditary and idiopathic neuropathy, unspecified: Secondary | ICD-10-CM | POA: Diagnosis not present

## 2017-01-16 DIAGNOSIS — L97512 Non-pressure chronic ulcer of other part of right foot with fat layer exposed: Secondary | ICD-10-CM | POA: Diagnosis not present

## 2017-01-16 DIAGNOSIS — G609 Hereditary and idiopathic neuropathy, unspecified: Secondary | ICD-10-CM | POA: Diagnosis not present

## 2017-01-17 DIAGNOSIS — G609 Hereditary and idiopathic neuropathy, unspecified: Secondary | ICD-10-CM | POA: Diagnosis not present

## 2017-01-18 DIAGNOSIS — G609 Hereditary and idiopathic neuropathy, unspecified: Secondary | ICD-10-CM | POA: Diagnosis not present

## 2017-01-19 DIAGNOSIS — G609 Hereditary and idiopathic neuropathy, unspecified: Secondary | ICD-10-CM | POA: Diagnosis not present

## 2017-01-20 DIAGNOSIS — G609 Hereditary and idiopathic neuropathy, unspecified: Secondary | ICD-10-CM | POA: Diagnosis not present

## 2017-01-21 DIAGNOSIS — G609 Hereditary and idiopathic neuropathy, unspecified: Secondary | ICD-10-CM | POA: Diagnosis not present

## 2017-01-22 DIAGNOSIS — G609 Hereditary and idiopathic neuropathy, unspecified: Secondary | ICD-10-CM | POA: Diagnosis not present

## 2017-01-23 ENCOUNTER — Other Ambulatory Visit: Payer: Self-pay

## 2017-01-23 DIAGNOSIS — G609 Hereditary and idiopathic neuropathy, unspecified: Secondary | ICD-10-CM | POA: Diagnosis not present

## 2017-01-23 DIAGNOSIS — I10 Essential (primary) hypertension: Secondary | ICD-10-CM | POA: Diagnosis not present

## 2017-01-23 DIAGNOSIS — M4802 Spinal stenosis, cervical region: Secondary | ICD-10-CM | POA: Diagnosis not present

## 2017-01-23 DIAGNOSIS — Z6841 Body Mass Index (BMI) 40.0 and over, adult: Secondary | ICD-10-CM | POA: Diagnosis not present

## 2017-01-23 NOTE — Telephone Encounter (Signed)
oxyCODONE-acetaminophen (PERCOCET/ROXICET) 5-325 MG tablet, REFILL REQUEST 

## 2017-01-24 DIAGNOSIS — G609 Hereditary and idiopathic neuropathy, unspecified: Secondary | ICD-10-CM | POA: Diagnosis not present

## 2017-01-25 DIAGNOSIS — G609 Hereditary and idiopathic neuropathy, unspecified: Secondary | ICD-10-CM | POA: Diagnosis not present

## 2017-01-25 MED ORDER — OXYCODONE-ACETAMINOPHEN 5-325 MG PO TABS: 1.0000 | ORAL_TABLET | Freq: Three times a day (TID) | ORAL | 0 refills | Status: DC | PRN

## 2017-01-25 NOTE — Telephone Encounter (Signed)
Informed rx ready for p/u

## 2017-01-26 ENCOUNTER — Other Ambulatory Visit: Payer: Self-pay | Admitting: Internal Medicine

## 2017-01-26 DIAGNOSIS — G609 Hereditary and idiopathic neuropathy, unspecified: Secondary | ICD-10-CM | POA: Diagnosis not present

## 2017-01-26 DIAGNOSIS — I2510 Atherosclerotic heart disease of native coronary artery without angina pectoris: Secondary | ICD-10-CM

## 2017-01-27 DIAGNOSIS — G609 Hereditary and idiopathic neuropathy, unspecified: Secondary | ICD-10-CM | POA: Diagnosis not present

## 2017-01-28 ENCOUNTER — Other Ambulatory Visit: Payer: Self-pay | Admitting: Internal Medicine

## 2017-01-28 DIAGNOSIS — I10 Essential (primary) hypertension: Secondary | ICD-10-CM

## 2017-01-28 DIAGNOSIS — G609 Hereditary and idiopathic neuropathy, unspecified: Secondary | ICD-10-CM | POA: Diagnosis not present

## 2017-01-29 DIAGNOSIS — G609 Hereditary and idiopathic neuropathy, unspecified: Secondary | ICD-10-CM | POA: Diagnosis not present

## 2017-01-30 DIAGNOSIS — G609 Hereditary and idiopathic neuropathy, unspecified: Secondary | ICD-10-CM | POA: Diagnosis not present

## 2017-01-31 ENCOUNTER — Other Ambulatory Visit: Payer: Self-pay | Admitting: Internal Medicine

## 2017-01-31 DIAGNOSIS — G609 Hereditary and idiopathic neuropathy, unspecified: Secondary | ICD-10-CM | POA: Diagnosis not present

## 2017-01-31 DIAGNOSIS — I10 Essential (primary) hypertension: Secondary | ICD-10-CM

## 2017-02-01 ENCOUNTER — Encounter: Payer: Self-pay | Admitting: Cardiovascular Disease

## 2017-02-01 ENCOUNTER — Ambulatory Visit (INDEPENDENT_AMBULATORY_CARE_PROVIDER_SITE_OTHER): Payer: Medicare HMO | Admitting: Cardiovascular Disease

## 2017-02-01 VITALS — BP 120/56 | HR 92 | Ht 66.0 in | Wt 264.6 lb

## 2017-02-01 DIAGNOSIS — I2510 Atherosclerotic heart disease of native coronary artery without angina pectoris: Secondary | ICD-10-CM | POA: Diagnosis not present

## 2017-02-01 DIAGNOSIS — I5022 Chronic systolic (congestive) heart failure: Secondary | ICD-10-CM | POA: Diagnosis not present

## 2017-02-01 DIAGNOSIS — I10 Essential (primary) hypertension: Secondary | ICD-10-CM

## 2017-02-01 DIAGNOSIS — G609 Hereditary and idiopathic neuropathy, unspecified: Secondary | ICD-10-CM | POA: Diagnosis not present

## 2017-02-01 NOTE — Progress Notes (Signed)
Cardiology Office Note Date:  02/01/2017   ID:  Carrie, [name] Jan 08, 1961, MRN [id number]  PCP:  [name] Salina, MD  Cardiologist:  Sherren Mocha, MD    Chief Complaint  Patient presents with  . Follow-up     History of Present Illness: [name] is a 56 y.o. female who presents for follow-up evaluation. The patient has been followed for nonischemic cardiomyopathy and nonobstructive coronary artery disease.  Reports mild dyspnea with exertion and intermittent leg swelling. Otherwise no complaints. Denies chest pain or pressure. No orthopnea or PND. No progression of her symptoms. She's been compliant with her medicines.   Past Medical History:  Diagnosis Date  . Adhesive capsulitis of left shoulder    Steroid injection Dr. Truman Hayward 1/12  . Adhesive capsulitis of right shoulder    Steroid injection Dr. Truman Hayward 1/12  . Adhesive capsulitis of shoulder    bilateral, Steroid injection Dr. Truman Hayward 1/12 bilaterally  . CAD (coronary artery disease)    nonobstructive. Last cardiac cath (2008) showing left circumflex with mid 50% stenosis and distal luminal irregularities. Also with RCA with mid to distal 30-40% stenosis. // Previously evaluated by [facility], never followed up outpatient.  Marland Kitchen CAP (community acquired pneumonia) 06/15/2012   05/2012 CXR: Mild opacification of the posterior lung base on the lateral film  as cannot exclude infection/atelectasis.    . CHF (congestive heart failure) ([place])   . Diabetes mellitus 2007   Type II, insulin dependent  . Diabetic retinopathy   . GERD (gastroesophageal reflux disease)   . Glaucoma   . Hyperlipidemia   . Hypertension   . Obesity   . Peripheral neuropathy     Past Surgical History:  Procedure Laterality Date  . ANTERIOR CERVICAL DECOMP/DISCECTOMY FUSION N/A 06/16/2016   Procedure: Anterior Cervical Decompression/discectomy Fusion - Cervical six - Cervical seven;  Surgeon: Eustace Moore, MD;  Location: [place];   Service: Neurosurgery;  Laterality: N/A;  Anterior Cervical Decompression/discectomy Fusion - Cervical six - Cervical seven  . APPENDECTOMY    . CARDIAC CATHETERIZATION    . CATARACT EXTRACTION    . GLAUCOMA SURGERY    . LEFT HEART CATHETERIZATION WITH CORONARY ANGIOGRAM N/A 01/24/2012   Procedure: LEFT HEART CATHETERIZATION WITH CORONARY ANGIOGRAM;  Surgeon: Sherren Mocha, MD;  Location[facility] LAB;  Service: Cardiovascular;  Laterality: N/A;  . POSTERIOR CERVICAL FUSION/FORAMINOTOMY N/A 10/22/2015   Procedure: Cervical three-Cervical Seven Posterior cervical fusion with lateral mass fixation,  Laminectomy Cervical Three-Cervical Seven;  Surgeon: Eustace Moore, MD;  Location: [place] NEURO ORS;  Service: Neurosurgery;  Laterality: N/A;  Posterior  . TUBAL LIGATION      Current Outpatient Prescriptions  Medication Sig Dispense Refill  . amitriptyline (ELAVIL) 25 MG tablet TAKE 1 TABLET BY MOUTH AT BEDTIME 90 tablet 2  . aspirin EC 81 MG EC tablet Take 1 tablet (81 mg total) by mouth daily.    . carvedilol (COREG) 6.25 MG tablet TAKE 1 TABLET BY MOUTH TWICE DAILY 180 tablet 0  . cyclobenzaprine (FLEXERIL) 10 MG tablet TAKE 1 TABLET BY MOUTH TWICE DAILY AS NEEDED FOR MUSCLE SPASMS 60 tablet 2  . dorzolamide-timolol (COSOPT) 22.3-6.8 MG/ML ophthalmic solution Place 1 drop into both eyes 2 (two) times daily.     . ferrous sulfate 325 (65 FE) MG tablet Take 1 tablet (325 mg total) by mouth daily with breakfast. 30 tablet 11  . furosemide (LASIX) 20 MG tablet TAKE 1 TABLET(20 MG) BY MOUTH DAILY  90 tablet 0  . gabapentin (NEURONTIN) 300 MG capsule Take 1 capsule (300 mg total) by mouth 3 (three) times daily. 270 capsule 1  . insulin aspart (NOVOLOG FLEXPEN) 100 UNIT/ML FlexPen Inject 12 Units into the skin 3 (three) times daily with meals. 15 mL 3  . Insulin Glargine (LANTUS SOLOSTAR) 100 UNIT/ML Solostar Pen Inject 44 Units into the skin at bedtime. 45 mL 5  . Insulin Pen Needle (B-D UF III MINI PEN  NEEDLES) 31G X 5 MM MISC Use to inject insulin 4-5 times daily 200 each 5  . IRON PO Take 1 tablet by mouth 3 (three) times daily.    Marland Kitchen latanoprost (XALATAN) 0.005 % ophthalmic solution Place 1 drop into both eyes at bedtime. Reported on 10/29/2015  4  . liraglutide 18 MG/3ML SOPN Inject 0.1 mLs (0.6 mg total) into the skin daily. 6 pen 0  . lisinopril (PRINIVIL,ZESTRIL) 20 MG tablet Take 10 mg by mouth daily.  3  . metFORMIN (GLUCOPHAGE) 1000 MG tablet Take 1,000 mg by mouth 2 (two) times daily with a meal.    . omeprazole (PRILOSEC) 20 MG capsule Take 1 capsule (20 mg total) by mouth 2 (two) times daily before a meal. 20 capsule 0  . ONETOUCH DELICA LANCETS FINE MISC Check blood sugar as instructed up to 3 times a day 100 each 12  . ONETOUCH VERIO test strip USE TO CHECK BLOOD SUGAR UP TO THREE TIMES DAILY AS DIRECTED 3000 each 12  . oxyCODONE-acetaminophen (PERCOCET/ROXICET) 5-325 MG tablet Take 1 tablet by mouth every 8 (eight) hours as needed for severe pain. 90 tablet 0  . pantoprazole (PROTONIX) 40 MG tablet Take 1 tablet (40 mg total) by mouth daily. 90 tablet 3  . polyethylene glycol (MIRALAX / GLYCOLAX) packet Take 17 g by mouth daily. 14 each 0  . pravastatin (PRAVACHOL) 20 MG tablet TAKE 1 TABLET BY MOUTH EVERY EVENING 90 tablet 1  . senna (SENOKOT) 8.6 MG TABS tablet Take 2 tablets (17.2 mg total) by mouth daily. 120 each 0   No current facility-administered medications for this visit.     Allergies:   Patient has no known allergies.   Social History:  The patient  reports that she quit smoking about 10 years ago. Her smoking use included Cigarettes. She has a 2.10 pack-year smoking history. She has never used smokeless tobacco. She reports that she does not drink alcohol or use drugs.   Family History:  The patient's family history includes Hypertension in her sister, sister, and sister.    ROS:  Please see the history of present illness.   All other systems are reviewed and  negative.    PHYSICAL EXAM: VS:  BP (!) 120/56   Pulse 92   Ht 5\' 6"  (1.676 m)   Wt 264 lb 9.6 oz (120 kg)   LMP 02/13/2011   BMI 42.71 kg/m  , BMI Body mass index is 42.71 kg/m. GEN: Well nourished, well developed, Pleasant morbidly obese woman in no acute distress  HEENT: normal  Neck: no JVD, no masses. No carotid bruits Cardiac: RRR without murmur or gallop                Respiratory:  clear to auscultation bilaterally, normal work of breathing GI: soft, nontender, nondistended, + BS MS: no deformity or atrophy  Ext: Trace bilateral pretibial edema, pedal pulses 2+= bilaterally Skin: warm and dry, no rash Neuro:  Strength and sensation are intact Psych: euthymic mood, full  affect  EKG:  EKG is ordered today. The ekg ordered today shows normal sinus rhythm 93 bpm, nonspecific T wave abnormality, moderate voltage criteria for LVH.  Recent Labs: 09/23/2016: ALT 12; BUN 14; Creatinine, Ser 0.90; Hemoglobin 11.5; Platelets 327; Potassium 4.1; Sodium 138   Lipid Panel     Component Value Date/Time   CHOL 182 09-May-2015 0109   TRIG 215 (H) 09-May-2015 0109   HDL 50 May 09, 2015 0109   CHOLHDL 3.6 May 09, 2015 0109   VLDL 43 (H) 05-09-2015 0109   LDLCALC 89 05/09/2015 0109      Wt Readings from Last 3 Encounters:  02/01/17 264 lb 9.6 oz (120 kg)  01/06/17 264 lb 9.6 oz (120 kg)  12/07/16 266 lb 8 oz (120.9 kg)     Cardiac Studies Reviewed: Echocardiogram 09-May-2015: Study Conclusions  - Left ventricle: The cavity size was normal. Wall thickness was   increased in a pattern of mild LVH. Systolic function was normal.   The estimated ejection fraction was in the range of 55% to 60%.   Wall motion was normal; there were no regional wall motion   abnormalities. Doppler parameters are consistent with abnormal   left ventricular relaxation (grade 1 diastolic dysfunction).  ASSESSMENT AND PLAN: 1.  Chronic systolic heart failure: I reviewed the patient's old echoes dating  back to 2013 when she had an ejection fraction of 35-40%. Her most recent echo in 2016 demonstrated an LVEF of 60%. She continues on a medical program that includes carvedilol, furosemide, and lisinopril. Will update her echocardiogram. Discussed lifestyle modification. Her blood pressure appears to be well controlled. I will see her back in one year.  2. Nonobstructive CAD: Reviewed remote cardiac catheterization from 2008 with 30-40% distal RCA stenosis and 50% mid circumflex stenosis. Continue medical management. Patient has no symptoms of angina.  3. Hypertension: Blood pressure well controlled  4. Hyperlipidemia: The patient is treated with pravastatin. Labs followed by primary care physician.  5. Morbid obesity BMI greater than 40: Lifestyle modification counseling done today.  Current medicines are reviewed with the patient today.  The patient does not have concerns regarding medicines.  Labs/ tests ordered today include:  No orders of the defined types were placed in this encounter.   Disposition:   FU one year  Signed, Sherren Mocha, MD  02/01/2017 9:13 AM    [address], [place], [place]  [address] Phone: [phone]; Fax: [phone]

## 2017-02-01 NOTE — Patient Instructions (Signed)
Medication Instructions:  Your physician recommends that you continue on your current medications as directed. Please refer to the Current Medication list given to you today.   Labwork: None Ordered   Testing/Procedures: Your physician has requested that you have an echocardiogram. Echocardiography is a painless test that uses sound waves to create images of your heart. It provides your doctor with information about the size and shape of your heart and how well your heart's chambers and valves are working. This procedure takes approximately one hour. There are no restrictions for this procedure.   Follow-Up: Your physician wants you to follow-up in: 1 year with Dr. Burt Knack.  You will receive a reminder letter in the mail two months in advance. If you don't receive a letter, please call our office to schedule the follow-up appointment.   If you need a refill on your cardiac medications before your next appointment, please call your pharmacy.   Thank you for choosing [facility]! Christen Bame, RN [phone]

## 2017-02-02 DIAGNOSIS — G609 Hereditary and idiopathic neuropathy, unspecified: Secondary | ICD-10-CM | POA: Diagnosis not present

## 2017-02-03 DIAGNOSIS — L97512 Non-pressure chronic ulcer of other part of right foot with fat layer exposed: Secondary | ICD-10-CM | POA: Diagnosis not present

## 2017-02-03 DIAGNOSIS — G609 Hereditary and idiopathic neuropathy, unspecified: Secondary | ICD-10-CM | POA: Diagnosis not present

## 2017-02-04 DIAGNOSIS — G609 Hereditary and idiopathic neuropathy, unspecified: Secondary | ICD-10-CM | POA: Diagnosis not present

## 2017-02-05 DIAGNOSIS — G609 Hereditary and idiopathic neuropathy, unspecified: Secondary | ICD-10-CM | POA: Diagnosis not present

## 2017-02-06 DIAGNOSIS — G609 Hereditary and idiopathic neuropathy, unspecified: Secondary | ICD-10-CM | POA: Diagnosis not present

## 2017-02-07 ENCOUNTER — Ambulatory Visit (HOSPITAL_COMMUNITY): Payer: Medicare HMO | Attending: Cardiovascular Disease

## 2017-02-07 ENCOUNTER — Other Ambulatory Visit: Payer: Self-pay

## 2017-02-07 DIAGNOSIS — I5022 Chronic systolic (congestive) heart failure: Secondary | ICD-10-CM | POA: Insufficient documentation

## 2017-02-07 DIAGNOSIS — G609 Hereditary and idiopathic neuropathy, unspecified: Secondary | ICD-10-CM | POA: Diagnosis not present

## 2017-02-07 MED ORDER — PERFLUTREN LIPID MICROSPHERE
1.0000 mL | INTRAVENOUS | Status: AC | PRN
  Administered 2017-02-07: 2 mL via INTRAVENOUS

## 2017-02-08 DIAGNOSIS — G609 Hereditary and idiopathic neuropathy, unspecified: Secondary | ICD-10-CM | POA: Diagnosis not present

## 2017-02-09 DIAGNOSIS — G609 Hereditary and idiopathic neuropathy, unspecified: Secondary | ICD-10-CM | POA: Diagnosis not present

## 2017-02-10 DIAGNOSIS — G609 Hereditary and idiopathic neuropathy, unspecified: Secondary | ICD-10-CM | POA: Diagnosis not present

## 2017-02-11 DIAGNOSIS — G609 Hereditary and idiopathic neuropathy, unspecified: Secondary | ICD-10-CM | POA: Diagnosis not present

## 2017-02-12 DIAGNOSIS — G609 Hereditary and idiopathic neuropathy, unspecified: Secondary | ICD-10-CM | POA: Diagnosis not present

## 2017-02-13 DIAGNOSIS — G609 Hereditary and idiopathic neuropathy, unspecified: Secondary | ICD-10-CM | POA: Diagnosis not present

## 2017-02-14 DIAGNOSIS — G609 Hereditary and idiopathic neuropathy, unspecified: Secondary | ICD-10-CM | POA: Diagnosis not present

## 2017-02-15 DIAGNOSIS — G609 Hereditary and idiopathic neuropathy, unspecified: Secondary | ICD-10-CM | POA: Diagnosis not present

## 2017-02-16 DIAGNOSIS — G609 Hereditary and idiopathic neuropathy, unspecified: Secondary | ICD-10-CM | POA: Diagnosis not present

## 2017-02-17 ENCOUNTER — Encounter: Payer: Self-pay | Admitting: Internal Medicine

## 2017-02-17 ENCOUNTER — Ambulatory Visit (INDEPENDENT_AMBULATORY_CARE_PROVIDER_SITE_OTHER): Payer: Medicare HMO | Admitting: Internal Medicine

## 2017-02-17 VITALS — BP 130/52 | HR 104 | Temp 98.3°F | Ht 66.0 in | Wt 261.3 lb

## 2017-02-17 DIAGNOSIS — E1140 Type 2 diabetes mellitus with diabetic neuropathy, unspecified: Secondary | ICD-10-CM

## 2017-02-17 DIAGNOSIS — M5489 Other dorsalgia: Secondary | ICD-10-CM | POA: Diagnosis not present

## 2017-02-17 DIAGNOSIS — Z794 Long term (current) use of insulin: Secondary | ICD-10-CM

## 2017-02-17 DIAGNOSIS — Z1239 Encounter for other screening for malignant neoplasm of breast: Secondary | ICD-10-CM

## 2017-02-17 DIAGNOSIS — I110 Hypertensive heart disease with heart failure: Secondary | ICD-10-CM

## 2017-02-17 DIAGNOSIS — I10 Essential (primary) hypertension: Secondary | ICD-10-CM | POA: Diagnosis not present

## 2017-02-17 DIAGNOSIS — IMO0002 Reserved for concepts with insufficient information to code with codable children: Secondary | ICD-10-CM

## 2017-02-17 DIAGNOSIS — M4802 Spinal stenosis, cervical region: Secondary | ICD-10-CM | POA: Diagnosis not present

## 2017-02-17 DIAGNOSIS — I2510 Atherosclerotic heart disease of native coronary artery without angina pectoris: Secondary | ICD-10-CM

## 2017-02-17 DIAGNOSIS — E782 Mixed hyperlipidemia: Secondary | ICD-10-CM | POA: Diagnosis not present

## 2017-02-17 DIAGNOSIS — E785 Hyperlipidemia, unspecified: Secondary | ICD-10-CM

## 2017-02-17 DIAGNOSIS — G894 Chronic pain syndrome: Secondary | ICD-10-CM | POA: Diagnosis not present

## 2017-02-17 DIAGNOSIS — E1165 Type 2 diabetes mellitus with hyperglycemia: Secondary | ICD-10-CM | POA: Diagnosis not present

## 2017-02-17 DIAGNOSIS — Z0000 Encounter for general adult medical examination without abnormal findings: Secondary | ICD-10-CM

## 2017-02-17 DIAGNOSIS — Z79891 Long term (current) use of opiate analgesic: Secondary | ICD-10-CM | POA: Diagnosis not present

## 2017-02-17 DIAGNOSIS — G609 Hereditary and idiopathic neuropathy, unspecified: Secondary | ICD-10-CM | POA: Diagnosis not present

## 2017-02-17 DIAGNOSIS — M25561 Pain in right knee: Secondary | ICD-10-CM | POA: Diagnosis not present

## 2017-02-17 DIAGNOSIS — E1142 Type 2 diabetes mellitus with diabetic polyneuropathy: Secondary | ICD-10-CM | POA: Diagnosis not present

## 2017-02-17 DIAGNOSIS — H401130 Primary open-angle glaucoma, bilateral, stage unspecified: Secondary | ICD-10-CM

## 2017-02-17 DIAGNOSIS — M542 Cervicalgia: Secondary | ICD-10-CM | POA: Diagnosis not present

## 2017-02-17 DIAGNOSIS — E1139 Type 2 diabetes mellitus with other diabetic ophthalmic complication: Secondary | ICD-10-CM | POA: Diagnosis not present

## 2017-02-17 DIAGNOSIS — M79651 Pain in right thigh: Secondary | ICD-10-CM | POA: Diagnosis not present

## 2017-02-17 DIAGNOSIS — H42 Glaucoma in diseases classified elsewhere: Secondary | ICD-10-CM | POA: Diagnosis not present

## 2017-02-17 LAB — GLUCOSE, CAPILLARY: Glucose-Capillary: 218 mg/dL — ABNORMAL HIGH (ref 65–99)

## 2017-02-17 MED ORDER — OXYCODONE-ACETAMINOPHEN 5-325 MG PO TABS: 1.0000 | ORAL_TABLET | Freq: Three times a day (TID) | ORAL | 0 refills | Status: AC | PRN

## 2017-02-17 MED ORDER — OXYCODONE-ACETAMINOPHEN 5-325 MG PO TABS: 1.0000 | ORAL_TABLET | Freq: Three times a day (TID) | ORAL | 0 refills | Status: DC | PRN

## 2017-02-17 NOTE — Progress Notes (Signed)
   CC: Follow up for diabetes management, medication refills  HPI:  [name] is a 56 y.o. returned for follow up visit after starting liraglutide at her last visit in July.  See problem based assessment and plan below for additional details  Past Medical History:  Diagnosis Date  . Adhesive capsulitis of left shoulder    Steroid injection Dr. Truman Hayward 1/12  . Adhesive capsulitis of right shoulder    Steroid injection Dr. Truman Hayward 1/12  . Adhesive capsulitis of shoulder    bilateral, Steroid injection Dr. Truman Hayward 1/12 bilaterally  . CAD (coronary artery disease)    nonobstructive. Last cardiac cath (2008) showing left circumflex with mid 50% stenosis and distal luminal irregularities. Also with RCA with mid to distal 30-40% stenosis. // Previously evaluated by [facility], never followed up outpatient.  Marland Kitchen CAP (community acquired pneumonia) 06/15/2012   05/2012 CXR: Mild opacification of the posterior lung base on the lateral film  as cannot exclude infection/atelectasis.    . CHF (congestive heart failure) ([place])   . Diabetes mellitus 2007   Type II, insulin dependent  . Diabetic retinopathy   . GERD (gastroesophageal reflux disease)   . Glaucoma   . Hyperlipidemia   . Hypertension   . Obesity   . Peripheral neuropathy     Review of Systems:  Review of Systems  Constitutional: Negative for malaise/fatigue.  Eyes: Positive for blurred vision. Negative for photophobia and pain.  Respiratory: Negative for shortness of breath.   Cardiovascular: Negative for chest pain.  Musculoskeletal: Positive for back pain and neck pain.  Neurological: Negative for weakness.  Endo/Heme/Allergies: Negative for polydipsia.    Physical Exam: Physical Exam  Constitutional: She is well-developed, well-nourished, and in no distress.  HENT:  Mouth/Throat: Oropharynx is clear and moist.  Cardiovascular: Normal rate and regular rhythm.   Pulmonary/Chest: Effort normal. She has no wheezes.  She has no rales.  Musculoskeletal: She exhibits no edema.  Mild tenderness to palpation of muscles of the upper back bilaterally Mild lateral right thigh and knee pain, strength and ROM intact  Lymphadenopathy:    She has no cervical adenopathy.  Skin: No rash noted. No erythema.    Vitals:   02/17/17 1559  BP: (!) 130/52  Pulse: (!) 104  Temp: 98.3 F (36.8 C)  TempSrc: Oral  SpO2: 98%  Weight: 261 lb 4.8 oz (118.5 kg)  Height: 5\' 6"  (1.676 m)    Assessment & Plan:   See Encounters Tab for problem based charting.  Patient discussed with Dr. Angelia Mould

## 2017-02-17 NOTE — Patient Instructions (Signed)
It was a pleasure to see you today Ms. Thurnell Garbe.  I will plan to see you again in 2 months for follow up of your diabetes and health maintenance including screenign for cognitive impairment and pap smear.  You will be contacted about setting up mammogram for breast cancer screening.  Keep up the good job with your diabetes medicines!

## 2017-02-18 DIAGNOSIS — G609 Hereditary and idiopathic neuropathy, unspecified: Secondary | ICD-10-CM | POA: Diagnosis not present

## 2017-02-18 LAB — BMP8+ANION GAP
Anion Gap: 16 mmol/L (ref 10.0–18.0)
BUN / CREAT RATIO: 18 (ref 9–23)
BUN: 20 mg/dL (ref 6–24)
CO2: 23 mmol/L (ref 20–29)
CREATININE: 1.09 mg/dL — AB (ref 0.57–1.00)
Calcium: 9.7 mg/dL (ref 8.7–10.2)
Chloride: 98 mmol/L (ref 96–106)
GFR calc non Af Amer: 56 mL/min/{1.73_m2} — ABNORMAL LOW (ref >59)
GFR, EST AFRICAN AMERICAN: 65 mL/min/{1.73_m2} (ref >59)
Glucose: 207 mg/dL — ABNORMAL HIGH (ref 65–99)
Potassium: 4.3 mmol/L (ref 3.5–5.2)
Sodium: 137 mmol/L (ref 134–144)

## 2017-02-18 LAB — LIPID PANEL
CHOL/HDL RATIO: 4.1 ratio (ref 0.0–4.4)
Cholesterol, Total: 222 mg/dL — ABNORMAL HIGH (ref 100–199)
HDL: 54 mg/dL (ref >39)
LDL CALC: 119 mg/dL — AB (ref 0–99)
Triglycerides: 245 mg/dL — ABNORMAL HIGH (ref 0–149)
VLDL CHOLESTEROL CAL: 49 mg/dL — AB (ref 5–40)

## 2017-02-19 DIAGNOSIS — G609 Hereditary and idiopathic neuropathy, unspecified: Secondary | ICD-10-CM | POA: Diagnosis not present

## 2017-02-20 DIAGNOSIS — G609 Hereditary and idiopathic neuropathy, unspecified: Secondary | ICD-10-CM | POA: Diagnosis not present

## 2017-02-21 DIAGNOSIS — G609 Hereditary and idiopathic neuropathy, unspecified: Secondary | ICD-10-CM | POA: Diagnosis not present

## 2017-02-21 DIAGNOSIS — I10 Essential (primary) hypertension: Secondary | ICD-10-CM | POA: Diagnosis not present

## 2017-02-21 DIAGNOSIS — Z6841 Body Mass Index (BMI) 40.0 and over, adult: Secondary | ICD-10-CM | POA: Diagnosis not present

## 2017-02-21 DIAGNOSIS — M4802 Spinal stenosis, cervical region: Secondary | ICD-10-CM | POA: Diagnosis not present

## 2017-02-22 DIAGNOSIS — G609 Hereditary and idiopathic neuropathy, unspecified: Secondary | ICD-10-CM | POA: Diagnosis not present

## 2017-02-22 MED ORDER — LIRAGLUTIDE 18 MG/3ML ~~LOC~~ SOPN: 1.2000 mg | PEN_INJECTOR | Freq: Every day | SUBCUTANEOUS | 2 refills | Status: DC

## 2017-02-22 NOTE — Progress Notes (Signed)
Internal Medicine Clinic Attending  Case discussed with Dr. Rice at the time of the visit.  We reviewed the resident's history and exam and pertinent patient test results.  I agree with the assessment, diagnosis, and plan of care documented in the resident's note.  

## 2017-02-22 NOTE — Assessment & Plan Note (Signed)
BP Readings from Last 3 Encounters:  02/17/17 (!) 130/52  02/01/17 (!) 120/56  01/06/17 138/64    Lab Results  Component Value Date   NA 137 02/17/2017   K 4.3 02/17/2017   CREATININE 1.09 (H) 02/17/2017    Assessment: Blood pressure control: Controlled Progress toward BP goal:  Stable Comments: Her blood pressure remains fairly well controlled on her current regimen, recent F/U with Dr. Burt Knack and repeat TTE this month showing no new problems.  Plan: Medications:  continue current medications Coreg 6.25mg , lisinopril 20mg , lasix 20mg  Educational resources provided: brochure (denies need ) Self management tools provided:   Other plans: We will check a basic metabolic panel today for chronic diuretic Rx - results remain normal No other changes

## 2017-02-22 NOTE — Assessment & Plan Note (Addendum)
[name] is on chronic opioid therapy for chronic pain. The date of the controlled substances contract is referenced in the [place] and / or the overview. Date of pain contract was 07/28/2014. As part of the treatment plan, the [place] controlled substance database is checked at least twice yearly and the database results are appropriate. I have last reviewed the results on 02/17/17.   The last UDS was on 03/11/16 and results are as expected. Patient needs at least a yearly UDS.   The patient is on oxycodone/acetaminophen (Percocet, Tylox) strength 5-325mg , 90 per 30 days. Adjunctive treatment includes Elavil, NSAID's and Muscle relaxants. This regimen allows [name] to function and does not cause excessive sedation or other side effects.  "The benefits of continuing opioid therapy outweigh the risks and chronic opioids will be continued. Ongoing education about safe opioid treatment is provided  Interventions today include: Refills - 2 paper Rx printed Repeat UDS by next visit ideally

## 2017-02-22 NOTE — Assessment & Plan Note (Signed)
She did not follow up with previously recommended breast cancer screening earlier this year.  She reports being agreeable at this time so will again refer for screening mammography.

## 2017-02-22 NOTE — Assessment & Plan Note (Signed)
She reports some blurry vision bilaterally without field deficits. She thinks her eyeglasses prescription amy need to be updated as well.  She has already scheduled a follow visit with [facility] within the next month. Continuing dorzolamide-timolol, latanoprost.

## 2017-02-22 NOTE — Assessment & Plan Note (Signed)
Currently she is due for eye examination and cervical cancer screening. Previously checked with cytology 4 years ago with without multiple prior normals q68yrs remains recommended until one more time. She has an appointment with [facility] next month.  Plan for pap smear at next PCP visit alter this year

## 2017-02-22 NOTE — Assessment & Plan Note (Addendum)
HPI: Her diabetes control seems to be slightly improved based on self reported home CBG measurements which are mostly in the 100s to low 200s. She did not bring her glucometer today for review. She denies any GI upset or problems with starting the liraglutide.  A: Type 2 diabetes, tolerating recent start of GLP-1 agonist We can follow up with repeat Hgb A1c and hopefully glucometer review at 6 weeks from now. She is tolerating liraglutide without GI upset or any hypoglycemia reported.  P: Increased liraglutide to 1.2mg  daily, reordered this today Continue insulin, metformin without changes F/U at 6 weeks

## 2017-02-23 DIAGNOSIS — G609 Hereditary and idiopathic neuropathy, unspecified: Secondary | ICD-10-CM | POA: Diagnosis not present

## 2017-02-24 DIAGNOSIS — G609 Hereditary and idiopathic neuropathy, unspecified: Secondary | ICD-10-CM | POA: Diagnosis not present

## 2017-02-25 DIAGNOSIS — G609 Hereditary and idiopathic neuropathy, unspecified: Secondary | ICD-10-CM | POA: Diagnosis not present

## 2017-02-26 DIAGNOSIS — G609 Hereditary and idiopathic neuropathy, unspecified: Secondary | ICD-10-CM | POA: Diagnosis not present

## 2017-02-27 ENCOUNTER — Other Ambulatory Visit: Payer: Self-pay | Admitting: Internal Medicine

## 2017-02-27 DIAGNOSIS — G609 Hereditary and idiopathic neuropathy, unspecified: Secondary | ICD-10-CM | POA: Diagnosis not present

## 2017-02-27 DIAGNOSIS — I10 Essential (primary) hypertension: Secondary | ICD-10-CM

## 2017-02-28 ENCOUNTER — Other Ambulatory Visit: Payer: Self-pay | Admitting: Neurological Surgery

## 2017-02-28 DIAGNOSIS — G609 Hereditary and idiopathic neuropathy, unspecified: Secondary | ICD-10-CM | POA: Diagnosis not present

## 2017-02-28 DIAGNOSIS — M4802 Spinal stenosis, cervical region: Secondary | ICD-10-CM

## 2017-03-01 ENCOUNTER — Telehealth: Payer: Self-pay | Admitting: *Deleted

## 2017-03-01 DIAGNOSIS — G609 Hereditary and idiopathic neuropathy, unspecified: Secondary | ICD-10-CM | POA: Diagnosis not present

## 2017-03-01 NOTE — Telephone Encounter (Addendum)
Information to [facility] through Cover My Meds for [place] for Victoza.  Awaiting determination in 24 to 72 hours.  Sander Nephew, RN 03/01/2017 11:30 AM. Return fax from [facility] with Denial for Victoza stating amount above 30 day limit.  Call to [facility] on [address] changed to 90 day supply.  No Prior Authorization will be needed.  Sander Nephew, RN 03/02/2017 9:02 AM

## 2017-03-02 DIAGNOSIS — G609 Hereditary and idiopathic neuropathy, unspecified: Secondary | ICD-10-CM | POA: Diagnosis not present

## 2017-03-03 ENCOUNTER — Ambulatory Visit
Admission: RE | Admit: 2017-03-03 | Discharge: 2017-03-03 | Disposition: A | Discharge status: Home / Self Care | Payer: Medicare HMO | Source: Ambulatory Visit | Attending: Neurological Surgery | Admitting: Neurological Surgery

## 2017-03-03 DIAGNOSIS — M542 Cervicalgia: Secondary | ICD-10-CM | POA: Diagnosis not present

## 2017-03-03 DIAGNOSIS — G609 Hereditary and idiopathic neuropathy, unspecified: Secondary | ICD-10-CM | POA: Diagnosis not present

## 2017-03-03 DIAGNOSIS — M4802 Spinal stenosis, cervical region: Secondary | ICD-10-CM

## 2017-03-03 DIAGNOSIS — M4322 Fusion of spine, cervical region: Secondary | ICD-10-CM | POA: Diagnosis not present

## 2017-03-04 DIAGNOSIS — G609 Hereditary and idiopathic neuropathy, unspecified: Secondary | ICD-10-CM | POA: Diagnosis not present

## 2017-03-05 DIAGNOSIS — G609 Hereditary and idiopathic neuropathy, unspecified: Secondary | ICD-10-CM | POA: Diagnosis not present

## 2017-03-06 DIAGNOSIS — G609 Hereditary and idiopathic neuropathy, unspecified: Secondary | ICD-10-CM | POA: Diagnosis not present

## 2017-03-07 DIAGNOSIS — G609 Hereditary and idiopathic neuropathy, unspecified: Secondary | ICD-10-CM | POA: Diagnosis not present

## 2017-03-08 DIAGNOSIS — G609 Hereditary and idiopathic neuropathy, unspecified: Secondary | ICD-10-CM | POA: Diagnosis not present

## 2017-03-09 DIAGNOSIS — G609 Hereditary and idiopathic neuropathy, unspecified: Secondary | ICD-10-CM | POA: Diagnosis not present

## 2017-03-10 DIAGNOSIS — G609 Hereditary and idiopathic neuropathy, unspecified: Secondary | ICD-10-CM | POA: Diagnosis not present

## 2017-03-11 DIAGNOSIS — G609 Hereditary and idiopathic neuropathy, unspecified: Secondary | ICD-10-CM | POA: Diagnosis not present

## 2017-03-12 DIAGNOSIS — G609 Hereditary and idiopathic neuropathy, unspecified: Secondary | ICD-10-CM | POA: Diagnosis not present

## 2017-03-13 DIAGNOSIS — G609 Hereditary and idiopathic neuropathy, unspecified: Secondary | ICD-10-CM | POA: Diagnosis not present

## 2017-03-14 DIAGNOSIS — G609 Hereditary and idiopathic neuropathy, unspecified: Secondary | ICD-10-CM | POA: Diagnosis not present

## 2017-03-15 DIAGNOSIS — G609 Hereditary and idiopathic neuropathy, unspecified: Secondary | ICD-10-CM | POA: Diagnosis not present

## 2017-03-16 DIAGNOSIS — G609 Hereditary and idiopathic neuropathy, unspecified: Secondary | ICD-10-CM | POA: Diagnosis not present

## 2017-03-17 DIAGNOSIS — G609 Hereditary and idiopathic neuropathy, unspecified: Secondary | ICD-10-CM | POA: Diagnosis not present

## 2017-03-18 DIAGNOSIS — G609 Hereditary and idiopathic neuropathy, unspecified: Secondary | ICD-10-CM | POA: Diagnosis not present

## 2017-03-19 DIAGNOSIS — G609 Hereditary and idiopathic neuropathy, unspecified: Secondary | ICD-10-CM | POA: Diagnosis not present

## 2017-03-20 DIAGNOSIS — G609 Hereditary and idiopathic neuropathy, unspecified: Secondary | ICD-10-CM | POA: Diagnosis not present

## 2017-03-21 DIAGNOSIS — G609 Hereditary and idiopathic neuropathy, unspecified: Secondary | ICD-10-CM | POA: Diagnosis not present

## 2017-03-22 DIAGNOSIS — G609 Hereditary and idiopathic neuropathy, unspecified: Secondary | ICD-10-CM | POA: Diagnosis not present

## 2017-03-23 DIAGNOSIS — G609 Hereditary and idiopathic neuropathy, unspecified: Secondary | ICD-10-CM | POA: Diagnosis not present

## 2017-03-24 DIAGNOSIS — G609 Hereditary and idiopathic neuropathy, unspecified: Secondary | ICD-10-CM | POA: Diagnosis not present

## 2017-03-25 ENCOUNTER — Other Ambulatory Visit: Payer: Self-pay | Admitting: Internal Medicine

## 2017-03-25 DIAGNOSIS — G609 Hereditary and idiopathic neuropathy, unspecified: Secondary | ICD-10-CM | POA: Diagnosis not present

## 2017-03-26 DIAGNOSIS — G609 Hereditary and idiopathic neuropathy, unspecified: Secondary | ICD-10-CM | POA: Diagnosis not present

## 2017-03-27 ENCOUNTER — Other Ambulatory Visit: Payer: Self-pay | Admitting: Internal Medicine

## 2017-03-27 DIAGNOSIS — G609 Hereditary and idiopathic neuropathy, unspecified: Secondary | ICD-10-CM | POA: Diagnosis not present

## 2017-03-27 NOTE — Telephone Encounter (Signed)
Refill request sent to PCP- surescript.

## 2017-03-27 NOTE — Telephone Encounter (Signed)
Patient is requesting refill on Gabapentin. Pls call pt.

## 2017-03-28 DIAGNOSIS — G609 Hereditary and idiopathic neuropathy, unspecified: Secondary | ICD-10-CM | POA: Diagnosis not present

## 2017-03-29 DIAGNOSIS — G609 Hereditary and idiopathic neuropathy, unspecified: Secondary | ICD-10-CM | POA: Diagnosis not present

## 2017-03-30 DIAGNOSIS — G609 Hereditary and idiopathic neuropathy, unspecified: Secondary | ICD-10-CM | POA: Diagnosis not present

## 2017-03-31 DIAGNOSIS — G609 Hereditary and idiopathic neuropathy, unspecified: Secondary | ICD-10-CM | POA: Diagnosis not present

## 2017-04-01 DIAGNOSIS — G609 Hereditary and idiopathic neuropathy, unspecified: Secondary | ICD-10-CM | POA: Diagnosis not present

## 2017-04-02 DIAGNOSIS — G609 Hereditary and idiopathic neuropathy, unspecified: Secondary | ICD-10-CM | POA: Diagnosis not present

## 2017-04-03 DIAGNOSIS — G609 Hereditary and idiopathic neuropathy, unspecified: Secondary | ICD-10-CM | POA: Diagnosis not present

## 2017-04-04 DIAGNOSIS — G609 Hereditary and idiopathic neuropathy, unspecified: Secondary | ICD-10-CM | POA: Diagnosis not present

## 2017-04-05 ENCOUNTER — Ambulatory Visit: Payer: Medicare HMO

## 2017-04-05 DIAGNOSIS — G609 Hereditary and idiopathic neuropathy, unspecified: Secondary | ICD-10-CM | POA: Diagnosis not present

## 2017-04-06 DIAGNOSIS — G609 Hereditary and idiopathic neuropathy, unspecified: Secondary | ICD-10-CM | POA: Diagnosis not present

## 2017-04-07 DIAGNOSIS — G609 Hereditary and idiopathic neuropathy, unspecified: Secondary | ICD-10-CM | POA: Diagnosis not present

## 2017-04-08 DIAGNOSIS — G609 Hereditary and idiopathic neuropathy, unspecified: Secondary | ICD-10-CM | POA: Diagnosis not present

## 2017-04-09 DIAGNOSIS — G609 Hereditary and idiopathic neuropathy, unspecified: Secondary | ICD-10-CM | POA: Diagnosis not present

## 2017-04-10 DIAGNOSIS — G609 Hereditary and idiopathic neuropathy, unspecified: Secondary | ICD-10-CM | POA: Diagnosis not present

## 2017-04-11 DIAGNOSIS — G609 Hereditary and idiopathic neuropathy, unspecified: Secondary | ICD-10-CM | POA: Diagnosis not present

## 2017-04-12 DIAGNOSIS — G609 Hereditary and idiopathic neuropathy, unspecified: Secondary | ICD-10-CM | POA: Diagnosis not present

## 2017-04-13 DIAGNOSIS — G609 Hereditary and idiopathic neuropathy, unspecified: Secondary | ICD-10-CM | POA: Diagnosis not present

## 2017-04-14 DIAGNOSIS — G609 Hereditary and idiopathic neuropathy, unspecified: Secondary | ICD-10-CM | POA: Diagnosis not present

## 2017-04-15 DIAGNOSIS — G609 Hereditary and idiopathic neuropathy, unspecified: Secondary | ICD-10-CM | POA: Diagnosis not present

## 2017-04-16 DIAGNOSIS — G609 Hereditary and idiopathic neuropathy, unspecified: Secondary | ICD-10-CM | POA: Diagnosis not present

## 2017-04-17 DIAGNOSIS — G609 Hereditary and idiopathic neuropathy, unspecified: Secondary | ICD-10-CM | POA: Diagnosis not present

## 2017-04-18 ENCOUNTER — Encounter: Payer: Self-pay | Admitting: *Deleted

## 2017-04-18 DIAGNOSIS — G609 Hereditary and idiopathic neuropathy, unspecified: Secondary | ICD-10-CM | POA: Diagnosis not present

## 2017-04-18 NOTE — Progress Notes (Signed)
Pt cancelled her Sept. 2018 DM eye exam with Dr. Katy Fitch- r/s to 04/04/17 and then no-showed that appt

## 2017-04-19 ENCOUNTER — Other Ambulatory Visit: Payer: Self-pay | Admitting: Internal Medicine

## 2017-04-19 DIAGNOSIS — K219 Gastro-esophageal reflux disease without esophagitis: Secondary | ICD-10-CM

## 2017-04-19 DIAGNOSIS — G609 Hereditary and idiopathic neuropathy, unspecified: Secondary | ICD-10-CM | POA: Diagnosis not present

## 2017-04-20 DIAGNOSIS — G609 Hereditary and idiopathic neuropathy, unspecified: Secondary | ICD-10-CM | POA: Diagnosis not present

## 2017-04-21 DIAGNOSIS — G609 Hereditary and idiopathic neuropathy, unspecified: Secondary | ICD-10-CM | POA: Diagnosis not present

## 2017-04-22 DIAGNOSIS — G609 Hereditary and idiopathic neuropathy, unspecified: Secondary | ICD-10-CM | POA: Diagnosis not present

## 2017-04-23 DIAGNOSIS — G609 Hereditary and idiopathic neuropathy, unspecified: Secondary | ICD-10-CM | POA: Diagnosis not present

## 2017-04-24 ENCOUNTER — Other Ambulatory Visit: Payer: Self-pay | Admitting: Internal Medicine

## 2017-04-24 DIAGNOSIS — G609 Hereditary and idiopathic neuropathy, unspecified: Secondary | ICD-10-CM | POA: Diagnosis not present

## 2017-04-24 DIAGNOSIS — M542 Cervicalgia: Secondary | ICD-10-CM

## 2017-04-25 DIAGNOSIS — G609 Hereditary and idiopathic neuropathy, unspecified: Secondary | ICD-10-CM | POA: Diagnosis not present

## 2017-04-26 ENCOUNTER — Other Ambulatory Visit: Payer: Self-pay

## 2017-04-26 DIAGNOSIS — G609 Hereditary and idiopathic neuropathy, unspecified: Secondary | ICD-10-CM | POA: Diagnosis not present

## 2017-04-26 DIAGNOSIS — M4802 Spinal stenosis, cervical region: Secondary | ICD-10-CM

## 2017-04-26 NOTE — Telephone Encounter (Signed)
oxyCODONE-acetaminophen (PERCOCET/ROXICET) 5-325 MG tablet, refill request.  

## 2017-04-27 ENCOUNTER — Encounter: Payer: Self-pay | Admitting: Internal Medicine

## 2017-04-27 ENCOUNTER — Ambulatory Visit (INDEPENDENT_AMBULATORY_CARE_PROVIDER_SITE_OTHER): Payer: Medicare HMO | Admitting: Internal Medicine

## 2017-04-27 VITALS — BP 140/79 | HR 94 | Temp 98.2°F | Ht 66.0 in | Wt 264.4 lb

## 2017-04-27 DIAGNOSIS — Z0000 Encounter for general adult medical examination without abnormal findings: Secondary | ICD-10-CM

## 2017-04-27 DIAGNOSIS — E669 Obesity, unspecified: Secondary | ICD-10-CM | POA: Diagnosis not present

## 2017-04-27 DIAGNOSIS — K5900 Constipation, unspecified: Secondary | ICD-10-CM

## 2017-04-27 DIAGNOSIS — E1140 Type 2 diabetes mellitus with diabetic neuropathy, unspecified: Secondary | ICD-10-CM

## 2017-04-27 DIAGNOSIS — M7061 Trochanteric bursitis, right hip: Secondary | ICD-10-CM | POA: Diagnosis not present

## 2017-04-27 DIAGNOSIS — M25551 Pain in right hip: Secondary | ICD-10-CM | POA: Diagnosis not present

## 2017-04-27 DIAGNOSIS — Z9181 History of falling: Secondary | ICD-10-CM

## 2017-04-27 DIAGNOSIS — IMO0002 Reserved for concepts with insufficient information to code with codable children: Secondary | ICD-10-CM

## 2017-04-27 DIAGNOSIS — H4010X0 Unspecified open-angle glaucoma, stage unspecified: Secondary | ICD-10-CM | POA: Diagnosis not present

## 2017-04-27 DIAGNOSIS — E113499 Type 2 diabetes mellitus with severe nonproliferative diabetic retinopathy without macular edema, unspecified eye: Secondary | ICD-10-CM

## 2017-04-27 DIAGNOSIS — G609 Hereditary and idiopathic neuropathy, unspecified: Secondary | ICD-10-CM | POA: Diagnosis not present

## 2017-04-27 DIAGNOSIS — Z6841 Body Mass Index (BMI) 40.0 and over, adult: Secondary | ICD-10-CM

## 2017-04-27 DIAGNOSIS — Z79891 Long term (current) use of opiate analgesic: Secondary | ICD-10-CM

## 2017-04-27 DIAGNOSIS — E113493 Type 2 diabetes mellitus with severe nonproliferative diabetic retinopathy without macular edema, bilateral: Secondary | ICD-10-CM

## 2017-04-27 DIAGNOSIS — Z23 Encounter for immunization: Secondary | ICD-10-CM | POA: Diagnosis not present

## 2017-04-27 DIAGNOSIS — E1165 Type 2 diabetes mellitus with hyperglycemia: Secondary | ICD-10-CM

## 2017-04-27 LAB — GLUCOSE, CAPILLARY: GLUCOSE-CAPILLARY: 184 mg/dL — AB (ref 65–99)

## 2017-04-27 LAB — POCT GLYCOSYLATED HEMOGLOBIN (HGB A1C): Hemoglobin A1C: 10.1

## 2017-04-27 MED ORDER — OXYCODONE-ACETAMINOPHEN 5-325 MG PO TABS: 1.0000 | ORAL_TABLET | Freq: Three times a day (TID) | ORAL | 0 refills | Status: DC | PRN

## 2017-04-27 NOTE — Progress Notes (Signed)
CC: Right hip pain  HPI:  [name] is a 56 y.o. female with PMHx detailed below presenting with lateral right uppain as well as needing medication refills and follow up for her diabetes.  See problem based assessment and plan below for additional details.  Lateral pain of right hip HPI: She has increased pain in the right hip for 2 weeks duration. She reports this pain is new although previous encounters note similar pain distribution earlier in the year. It is worst when flexing her leg far and sometimes when walking. There is pain with direct pressure over the lateral hip and buttock. The pain radiates slightly down the lateral thigh and never to the groin. She attributes longstanding problems in this hip to a remote fall with dislocation needing orthopedic surgery to realign. A: Greater trochanteric pain syndrome. This problem is generally self limiting but likely to persist for weeks. She is already on chronic opioid pain medications. P: She can also use NSAIDs IF the pain remains severe recommended returning for steroid injection for faster benefit  Preventative health care Flu vaccine provided today  Severe nonproliferative diabetic retinopathy ([place]) She missed her previous scheduled appointment with Dr. Katy Fitch and says she has a new appointment scheduled for November 12th or 13th. I recommended she keep this due to having both diabetic retinopathy and open angle glaucoma.  Long-term current use of opiate analgesic BRETT SOZA is on chronic opioid therapy for chronic pain. The date of the controlled substances contract is referenced in the [place] and / or the overview. Date of pain contract was 07/28/2014. As part of the treatment plan, the [place] controlled substance database is checked at least twice yearly and the database results are appropriate. I have last reviewed the results on 02/17/17.   The last UDS was on 03/11/16 and results are as expected. Patient needs at  least a yearly UDS.   The patient is on oxycodone/acetaminophen (Percocet, Tylox) strength 5-325mg , 90 per 30 days. Adjunctive treatment includes Elavil, NSAID's and Muscle relaxants. This regimen allows Marcene Brawn to function and does not cause excessive sedation or other side effects.  The benefits of continuing opioid therapy outweigh the risks and chronic opioids will be continued. Ongoing education about safe opioid treatment is provided Her associated constipation symptoms are improved  Interventions today include: Refills - 1 paper Rx printed today Toxassure collected in clinic today, she reports last dose taken 1 day prior She plans to return to clinic within the month for other reasons and will refill as appropriate, otherwise can pick them up       Past Medical History:  Diagnosis Date  . Adhesive capsulitis of left shoulder    Steroid injection Dr. Truman Hayward 1/12  . Adhesive capsulitis of right shoulder    Steroid injection Dr. Truman Hayward 1/12  . Adhesive capsulitis of shoulder    bilateral, Steroid injection Dr. Truman Hayward 1/12 bilaterally  . CAD (coronary artery disease)    nonobstructive. Last cardiac cath (2008) showing left circumflex with mid 50% stenosis and distal luminal irregularities. Also with RCA with mid to distal 30-40% stenosis. // Previously evaluated by [facility], never followed up outpatient.  Marland Kitchen CAP (community acquired pneumonia) 06/15/2012   05/2012 CXR: Mild opacification of the posterior lung base on the lateral film  as cannot exclude infection/atelectasis.    . CHF (congestive heart failure) ([place])   . Diabetes mellitus 2007   Type II, insulin dependent  . Diabetic retinopathy   .  GERD (gastroesophageal reflux disease)   . Glaucoma   . Hyperlipidemia   . Hypertension   . Obesity   . Peripheral neuropathy     Review of Systems: Review of Systems  Constitutional: Negative for chills and fever.  Respiratory: Negative for shortness of breath.     Cardiovascular: Negative for leg swelling.  Gastrointestinal: Positive for constipation. Negative for blood in stool, diarrhea and nausea.  Genitourinary: Negative for dysuria.  Musculoskeletal: Positive for joint pain, myalgias and neck pain. Negative for falls.  Neurological: Negative for dizziness, focal weakness and weakness.  Psychiatric/Behavioral: The patient does not have insomnia.      Physical Exam: Vitals:   04/27/17 0900 04/27/17 0934  BP: (!) 149/71 140/79  Pulse: (!) 102 94  Temp: 98.2 F (36.8 C)   TempSrc: Oral   SpO2: 100%   Weight: 264 lb 6.4 oz (119.9 kg)   Height: 5\' 6"  (1.676 m)    GENERAL- obese woman sitting comfortably in NAD HEENT- Atraumatic, PERRL, oral mucosa appears moist, good and intact dentition. CARDIAC- RRR, no murmurs, rubs or gallops. RESP- CTAB, no wheezes or crackles. BACK- Tenderness to palpation over right paraspinal muscles, pain to direct palpation laterally over greater trochanteric head, no pain on internal rotation or radiation of pain NEURO- Strength lower extremities- 5/5, Sensation intact globally EXTREMITIES- Symmetric, trace pedal edema. SKIN- Warm, dry, No rash or lesion. PSYCH- Normal mood and affect, appropriate thought content and speech.    Assessment & Plan:   See encounters tab for problem based medical decision making.   Patient discussed with Dr. Evette Doffing

## 2017-04-27 NOTE — Patient Instructions (Addendum)
It was a pleasure to see you today [name].  I think your hip pain is due to muscle or tendon inflammation not any problem in the joint itself. This can usually improve on its own within about 2 weeks with antiinflammatory medicines such as aleve or muscle relaxants such as flexeril can be beneficial in the meantime. If the pain is not improving by later this month we can see you for an injection or change in treatment.  We will need to see you again sometime soon for your second vaccine for pneumonia.  Your blood pressure was borderline elevated, but we would need to check again while you are taking all medications before changing anything.  Please keep your appointment with Dr. Zenia Resides office later this month.  I will call you if anything is problematic on blood tests today.

## 2017-04-28 DIAGNOSIS — G609 Hereditary and idiopathic neuropathy, unspecified: Secondary | ICD-10-CM | POA: Diagnosis not present

## 2017-04-28 NOTE — Assessment & Plan Note (Signed)
[name] is on chronic opioid therapy for chronic pain. The date of the controlled substances contract is referenced in the [place] and / or the overview. Date of pain contract was 07/28/2014. As part of the treatment plan, the [place] controlled substance database is checked at least twice yearly and the database results are appropriate. I have last reviewed the results on 02/17/17.   The last UDS was on 03/11/16 and results are as expected. Patient needs at least a yearly UDS.   The patient is on oxycodone/acetaminophen (Percocet, Tylox) strength 5-325mg , 90 per 30 days. Adjunctive treatment includes Elavil, NSAID's and Muscle relaxants. This regimen allows [name] to function and does not cause excessive sedation or other side effects.  The benefits of continuing opioid therapy outweigh the risks and chronic opioids will be continued. Ongoing education about safe opioid treatment is provided Her associated constipation symptoms are improved  Interventions today include: Refills - 1 paper Rx printed today Toxassure collected in clinic today, she reports last dose taken 1 day prior She plans to return to clinic within the month for other reasons and will refill as appropriate, otherwise can pick them up

## 2017-04-28 NOTE — Assessment & Plan Note (Signed)
HPI: She has increased pain in the right hip for 2 weeks duration. She reports this pain is new although previous encounters note similar pain distribution earlier in the year. It is worst when flexing her leg far and sometimes when walking. There is pain with direct pressure over the lateral hip and buttock. The pain radiates slightly down the lateral thigh and never to the groin. She attributes longstanding problems in this hip to a remote fall with dislocation needing orthopedic surgery to realign. A: Greater trochanteric pain syndrome. This problem is generally self limiting but likely to persist for weeks. She is already on chronic opioid pain medications. P: She can also use NSAIDs IF the pain remains severe recommended returning for steroid injection for faster benefit

## 2017-04-28 NOTE — Assessment & Plan Note (Signed)
She missed her previous scheduled appointment with Dr. Katy Fitch and says she has a new appointment scheduled for November 12th or 13th. I recommended she keep this due to having both diabetic retinopathy and open angle glaucoma.

## 2017-04-28 NOTE — Assessment & Plan Note (Signed)
Flu vaccine provided today.  

## 2017-04-29 DIAGNOSIS — G609 Hereditary and idiopathic neuropathy, unspecified: Secondary | ICD-10-CM | POA: Diagnosis not present

## 2017-04-30 DIAGNOSIS — G609 Hereditary and idiopathic neuropathy, unspecified: Secondary | ICD-10-CM | POA: Diagnosis not present

## 2017-05-01 ENCOUNTER — Other Ambulatory Visit: Payer: Self-pay | Admitting: Internal Medicine

## 2017-05-01 DIAGNOSIS — G609 Hereditary and idiopathic neuropathy, unspecified: Secondary | ICD-10-CM | POA: Diagnosis not present

## 2017-05-01 DIAGNOSIS — I10 Essential (primary) hypertension: Secondary | ICD-10-CM

## 2017-05-01 NOTE — Progress Notes (Signed)
Internal Medicine Clinic Attending  Case discussed with Dr. Rice at the time of the visit.  We reviewed the resident's history and exam and pertinent patient test results.  I agree with the assessment, diagnosis, and plan of care documented in the resident's note.  

## 2017-05-02 DIAGNOSIS — G609 Hereditary and idiopathic neuropathy, unspecified: Secondary | ICD-10-CM | POA: Diagnosis not present

## 2017-05-03 DIAGNOSIS — G609 Hereditary and idiopathic neuropathy, unspecified: Secondary | ICD-10-CM | POA: Diagnosis not present

## 2017-05-04 DIAGNOSIS — G609 Hereditary and idiopathic neuropathy, unspecified: Secondary | ICD-10-CM | POA: Diagnosis not present

## 2017-05-04 LAB — TOXASSURE SELECT,+ANTIDEPR,UR

## 2017-05-05 DIAGNOSIS — G609 Hereditary and idiopathic neuropathy, unspecified: Secondary | ICD-10-CM | POA: Diagnosis not present

## 2017-05-06 DIAGNOSIS — G609 Hereditary and idiopathic neuropathy, unspecified: Secondary | ICD-10-CM | POA: Diagnosis not present

## 2017-05-07 DIAGNOSIS — G609 Hereditary and idiopathic neuropathy, unspecified: Secondary | ICD-10-CM | POA: Diagnosis not present

## 2017-05-08 DIAGNOSIS — G609 Hereditary and idiopathic neuropathy, unspecified: Secondary | ICD-10-CM | POA: Diagnosis not present

## 2017-05-09 DIAGNOSIS — G609 Hereditary and idiopathic neuropathy, unspecified: Secondary | ICD-10-CM | POA: Diagnosis not present

## 2017-05-10 DIAGNOSIS — G609 Hereditary and idiopathic neuropathy, unspecified: Secondary | ICD-10-CM | POA: Diagnosis not present

## 2017-05-11 DIAGNOSIS — G609 Hereditary and idiopathic neuropathy, unspecified: Secondary | ICD-10-CM | POA: Diagnosis not present

## 2017-05-12 DIAGNOSIS — G609 Hereditary and idiopathic neuropathy, unspecified: Secondary | ICD-10-CM | POA: Diagnosis not present

## 2017-05-13 DIAGNOSIS — G609 Hereditary and idiopathic neuropathy, unspecified: Secondary | ICD-10-CM | POA: Diagnosis not present

## 2017-05-14 DIAGNOSIS — G609 Hereditary and idiopathic neuropathy, unspecified: Secondary | ICD-10-CM | POA: Diagnosis not present

## 2017-05-15 DIAGNOSIS — G609 Hereditary and idiopathic neuropathy, unspecified: Secondary | ICD-10-CM | POA: Diagnosis not present

## 2017-05-16 DIAGNOSIS — G609 Hereditary and idiopathic neuropathy, unspecified: Secondary | ICD-10-CM | POA: Diagnosis not present

## 2017-05-17 DIAGNOSIS — G609 Hereditary and idiopathic neuropathy, unspecified: Secondary | ICD-10-CM | POA: Diagnosis not present

## 2017-05-18 DIAGNOSIS — G609 Hereditary and idiopathic neuropathy, unspecified: Secondary | ICD-10-CM | POA: Diagnosis not present

## 2017-05-19 DIAGNOSIS — G609 Hereditary and idiopathic neuropathy, unspecified: Secondary | ICD-10-CM | POA: Diagnosis not present

## 2017-05-20 DIAGNOSIS — G609 Hereditary and idiopathic neuropathy, unspecified: Secondary | ICD-10-CM | POA: Diagnosis not present

## 2017-05-21 DIAGNOSIS — G609 Hereditary and idiopathic neuropathy, unspecified: Secondary | ICD-10-CM | POA: Diagnosis not present

## 2017-05-22 DIAGNOSIS — G609 Hereditary and idiopathic neuropathy, unspecified: Secondary | ICD-10-CM | POA: Diagnosis not present

## 2017-05-23 ENCOUNTER — Telehealth: Payer: Self-pay

## 2017-05-23 DIAGNOSIS — G609 Hereditary and idiopathic neuropathy, unspecified: Secondary | ICD-10-CM | POA: Diagnosis not present

## 2017-05-23 NOTE — Telephone Encounter (Signed)
Rtc, no answer, no vmail 

## 2017-05-23 NOTE — Telephone Encounter (Signed)
Please call pt back about meds. Please call pt back.

## 2017-05-24 DIAGNOSIS — G609 Hereditary and idiopathic neuropathy, unspecified: Secondary | ICD-10-CM | POA: Diagnosis not present

## 2017-05-24 NOTE — Telephone Encounter (Signed)
No answer

## 2017-05-25 DIAGNOSIS — G609 Hereditary and idiopathic neuropathy, unspecified: Secondary | ICD-10-CM | POA: Diagnosis not present

## 2017-05-25 NOTE — Telephone Encounter (Signed)
Patient calling back regarding medicine, pls call patient

## 2017-05-26 DIAGNOSIS — G609 Hereditary and idiopathic neuropathy, unspecified: Secondary | ICD-10-CM | POA: Diagnosis not present

## 2017-05-27 DIAGNOSIS — G609 Hereditary and idiopathic neuropathy, unspecified: Secondary | ICD-10-CM | POA: Diagnosis not present

## 2017-05-28 ENCOUNTER — Other Ambulatory Visit: Payer: Self-pay | Admitting: Internal Medicine

## 2017-05-28 DIAGNOSIS — M542 Cervicalgia: Secondary | ICD-10-CM

## 2017-05-28 DIAGNOSIS — G609 Hereditary and idiopathic neuropathy, unspecified: Secondary | ICD-10-CM | POA: Diagnosis not present

## 2017-05-28 DIAGNOSIS — B86 Scabies: Secondary | ICD-10-CM

## 2017-05-29 ENCOUNTER — Other Ambulatory Visit: Payer: Self-pay | Admitting: *Deleted

## 2017-05-29 ENCOUNTER — Telehealth: Payer: Self-pay | Admitting: Internal Medicine

## 2017-05-29 DIAGNOSIS — G609 Hereditary and idiopathic neuropathy, unspecified: Secondary | ICD-10-CM | POA: Diagnosis not present

## 2017-05-29 DIAGNOSIS — Z79891 Long term (current) use of opiate analgesic: Secondary | ICD-10-CM

## 2017-05-29 DIAGNOSIS — B86 Scabies: Secondary | ICD-10-CM

## 2017-05-29 MED ORDER — OXYCODONE-ACETAMINOPHEN 5-325 MG PO TABS: 1.0000 | ORAL_TABLET | Freq: Three times a day (TID) | ORAL | 0 refills | Status: DC | PRN

## 2017-05-29 NOTE — Telephone Encounter (Signed)
Request sent to pcp.

## 2017-05-29 NOTE — Telephone Encounter (Signed)
Pt would like a call back about her Pain Medication.

## 2017-05-29 NOTE — Telephone Encounter (Signed)
Have spoken to pt

## 2017-05-30 DIAGNOSIS — G609 Hereditary and idiopathic neuropathy, unspecified: Secondary | ICD-10-CM | POA: Diagnosis not present

## 2017-05-31 DIAGNOSIS — G609 Hereditary and idiopathic neuropathy, unspecified: Secondary | ICD-10-CM | POA: Diagnosis not present

## 2017-06-01 DIAGNOSIS — G609 Hereditary and idiopathic neuropathy, unspecified: Secondary | ICD-10-CM | POA: Diagnosis not present

## 2017-06-02 ENCOUNTER — Encounter: Payer: Medicare HMO | Admitting: Internal Medicine

## 2017-06-02 DIAGNOSIS — G609 Hereditary and idiopathic neuropathy, unspecified: Secondary | ICD-10-CM | POA: Diagnosis not present

## 2017-06-03 DIAGNOSIS — G609 Hereditary and idiopathic neuropathy, unspecified: Secondary | ICD-10-CM | POA: Diagnosis not present

## 2017-06-04 DIAGNOSIS — G609 Hereditary and idiopathic neuropathy, unspecified: Secondary | ICD-10-CM | POA: Diagnosis not present

## 2017-06-05 DIAGNOSIS — G609 Hereditary and idiopathic neuropathy, unspecified: Secondary | ICD-10-CM | POA: Diagnosis not present

## 2017-06-06 DIAGNOSIS — G609 Hereditary and idiopathic neuropathy, unspecified: Secondary | ICD-10-CM | POA: Diagnosis not present

## 2017-06-07 DIAGNOSIS — G609 Hereditary and idiopathic neuropathy, unspecified: Secondary | ICD-10-CM | POA: Diagnosis not present

## 2017-06-08 DIAGNOSIS — G609 Hereditary and idiopathic neuropathy, unspecified: Secondary | ICD-10-CM | POA: Diagnosis not present

## 2017-06-09 DIAGNOSIS — G609 Hereditary and idiopathic neuropathy, unspecified: Secondary | ICD-10-CM | POA: Diagnosis not present

## 2017-06-10 DIAGNOSIS — G609 Hereditary and idiopathic neuropathy, unspecified: Secondary | ICD-10-CM | POA: Diagnosis not present

## 2017-06-11 DIAGNOSIS — G609 Hereditary and idiopathic neuropathy, unspecified: Secondary | ICD-10-CM | POA: Diagnosis not present

## 2017-06-12 DIAGNOSIS — G609 Hereditary and idiopathic neuropathy, unspecified: Secondary | ICD-10-CM | POA: Diagnosis not present

## 2017-06-13 DIAGNOSIS — G609 Hereditary and idiopathic neuropathy, unspecified: Secondary | ICD-10-CM | POA: Diagnosis not present

## 2017-06-14 DIAGNOSIS — G609 Hereditary and idiopathic neuropathy, unspecified: Secondary | ICD-10-CM | POA: Diagnosis not present

## 2017-06-15 DIAGNOSIS — G609 Hereditary and idiopathic neuropathy, unspecified: Secondary | ICD-10-CM | POA: Diagnosis not present

## 2017-06-16 DIAGNOSIS — G609 Hereditary and idiopathic neuropathy, unspecified: Secondary | ICD-10-CM | POA: Diagnosis not present

## 2017-06-17 DIAGNOSIS — G609 Hereditary and idiopathic neuropathy, unspecified: Secondary | ICD-10-CM | POA: Diagnosis not present

## 2017-06-18 DIAGNOSIS — G609 Hereditary and idiopathic neuropathy, unspecified: Secondary | ICD-10-CM | POA: Diagnosis not present

## 2017-06-19 DIAGNOSIS — G609 Hereditary and idiopathic neuropathy, unspecified: Secondary | ICD-10-CM | POA: Diagnosis not present

## 2017-06-20 DIAGNOSIS — G609 Hereditary and idiopathic neuropathy, unspecified: Secondary | ICD-10-CM | POA: Diagnosis not present

## 2017-06-21 DIAGNOSIS — G609 Hereditary and idiopathic neuropathy, unspecified: Secondary | ICD-10-CM | POA: Diagnosis not present

## 2017-06-22 DIAGNOSIS — G609 Hereditary and idiopathic neuropathy, unspecified: Secondary | ICD-10-CM | POA: Diagnosis not present

## 2017-06-23 DIAGNOSIS — G609 Hereditary and idiopathic neuropathy, unspecified: Secondary | ICD-10-CM | POA: Diagnosis not present

## 2017-06-24 DIAGNOSIS — G609 Hereditary and idiopathic neuropathy, unspecified: Secondary | ICD-10-CM | POA: Diagnosis not present

## 2017-06-25 ENCOUNTER — Other Ambulatory Visit: Payer: Self-pay | Admitting: Internal Medicine

## 2017-06-25 DIAGNOSIS — G609 Hereditary and idiopathic neuropathy, unspecified: Secondary | ICD-10-CM | POA: Diagnosis not present

## 2017-06-26 DIAGNOSIS — G609 Hereditary and idiopathic neuropathy, unspecified: Secondary | ICD-10-CM | POA: Diagnosis not present

## 2017-06-27 DIAGNOSIS — G609 Hereditary and idiopathic neuropathy, unspecified: Secondary | ICD-10-CM | POA: Diagnosis not present

## 2017-06-28 ENCOUNTER — Other Ambulatory Visit: Payer: Self-pay

## 2017-06-28 ENCOUNTER — Ambulatory Visit (INDEPENDENT_AMBULATORY_CARE_PROVIDER_SITE_OTHER): Payer: Medicare HMO | Admitting: Internal Medicine

## 2017-06-28 VITALS — BP 114/88 | HR 90 | Temp 98.2°F | Wt 267.1 lb

## 2017-06-28 DIAGNOSIS — M7502 Adhesive capsulitis of left shoulder: Secondary | ICD-10-CM | POA: Diagnosis not present

## 2017-06-28 DIAGNOSIS — M7501 Adhesive capsulitis of right shoulder: Secondary | ICD-10-CM

## 2017-06-28 DIAGNOSIS — M549 Dorsalgia, unspecified: Secondary | ICD-10-CM | POA: Diagnosis not present

## 2017-06-28 DIAGNOSIS — R0781 Pleurodynia: Secondary | ICD-10-CM | POA: Diagnosis not present

## 2017-06-28 DIAGNOSIS — E669 Obesity, unspecified: Secondary | ICD-10-CM

## 2017-06-28 DIAGNOSIS — I2510 Atherosclerotic heart disease of native coronary artery without angina pectoris: Secondary | ICD-10-CM

## 2017-06-28 DIAGNOSIS — Z6841 Body Mass Index (BMI) 40.0 and over, adult: Secondary | ICD-10-CM

## 2017-06-28 DIAGNOSIS — I110 Hypertensive heart disease with heart failure: Secondary | ICD-10-CM

## 2017-06-28 DIAGNOSIS — M542 Cervicalgia: Secondary | ICD-10-CM

## 2017-06-28 DIAGNOSIS — Z79899 Other long term (current) drug therapy: Secondary | ICD-10-CM

## 2017-06-28 DIAGNOSIS — I509 Heart failure, unspecified: Secondary | ICD-10-CM

## 2017-06-28 DIAGNOSIS — Z79891 Long term (current) use of opiate analgesic: Secondary | ICD-10-CM

## 2017-06-28 DIAGNOSIS — E1140 Type 2 diabetes mellitus with diabetic neuropathy, unspecified: Secondary | ICD-10-CM

## 2017-06-28 DIAGNOSIS — IMO0002 Reserved for concepts with insufficient information to code with codable children: Secondary | ICD-10-CM

## 2017-06-28 DIAGNOSIS — R6883 Chills (without fever): Secondary | ICD-10-CM | POA: Diagnosis not present

## 2017-06-28 DIAGNOSIS — G8929 Other chronic pain: Secondary | ICD-10-CM | POA: Diagnosis not present

## 2017-06-28 DIAGNOSIS — E1165 Type 2 diabetes mellitus with hyperglycemia: Secondary | ICD-10-CM

## 2017-06-28 DIAGNOSIS — R05 Cough: Secondary | ICD-10-CM | POA: Diagnosis not present

## 2017-06-28 DIAGNOSIS — G609 Hereditary and idiopathic neuropathy, unspecified: Secondary | ICD-10-CM | POA: Diagnosis not present

## 2017-06-28 MED ORDER — GABAPENTIN 300 MG PO CAPS: 300.0000 mg | ORAL_CAPSULE | Freq: Three times a day (TID) | ORAL | 0 refills | Status: DC

## 2017-06-28 MED ORDER — CETIRIZINE HCL 5 MG PO TABS: 5.0000 mg | ORAL_TABLET | Freq: Every day | ORAL | 0 refills | Status: DC

## 2017-06-28 MED ORDER — CYCLOBENZAPRINE HCL 10 MG PO TABS: ORAL_TABLET | ORAL | 0 refills | Status: DC

## 2017-06-28 MED ORDER — DICLOFENAC SODIUM 1 % TD GEL: 2.0000 g | Freq: Three times a day (TID) | TRANSDERMAL | 0 refills | Status: DC

## 2017-06-28 MED ORDER — DICLOFENAC SODIUM 1 % TD GEL: 2.0000 g | Freq: Three times a day (TID) | TRANSDERMAL | Status: DC

## 2017-06-28 MED ORDER — CETIRIZINE HCL 10 MG PO TABS
5.0000 mg | ORAL_TABLET | Freq: Every day | ORAL | Status: DC
Start: 2017-06-28 — End: 2017-06-28

## 2017-06-28 MED ORDER — OXYCODONE-ACETAMINOPHEN 5-325 MG PO TABS: 1.0000 | ORAL_TABLET | Freq: Three times a day (TID) | ORAL | 0 refills | Status: DC | PRN

## 2017-06-28 NOTE — Progress Notes (Signed)
   CC: acute right sided rib pain.  HPI:  [name] is a 57 y.o. with PMH significant for CAD, CHF, diabetes, hypertension, and adhesive capsulitis of bilateral shoulders who presents with acute right-sided rib pain for the last 1.5 weeks. She is also requesting refills of her gabapentin, Flexeril, and Percocet.  Past Medical History:  Diagnosis Date  . Adhesive capsulitis of left shoulder    Steroid injection Dr. Truman Hayward 1/12  . Adhesive capsulitis of right shoulder    Steroid injection Dr. Truman Hayward 1/12  . Adhesive capsulitis of shoulder    bilateral, Steroid injection Dr. Truman Hayward 1/12 bilaterally  . CAD (coronary artery disease)    nonobstructive. Last cardiac cath (2008) showing left circumflex with mid 50% stenosis and distal luminal irregularities. Also with RCA with mid to distal 30-40% stenosis. // Previously evaluated by [facility], never followed up outpatient.  Marland Kitchen CAP (community acquired pneumonia) 06/15/2012   05/2012 CXR: Mild opacification of the posterior lung base on the lateral film  as cannot exclude infection/atelectasis.    . CHF (congestive heart failure) ([place])   . Diabetes mellitus 2007   Type II, insulin dependent  . Diabetic retinopathy   . GERD (gastroesophageal reflux disease)   . Glaucoma   . Hyperlipidemia   . Hypertension   . Obesity   . Peripheral neuropathy    Review of Systems:   Constitutional: Negative for diaphoresis, malaise/fatigue, and weight loss. Positive for subjective chills. HEENT: Negative for blurred vision, hearing loss, sinus pain, congestion, and sore throat. Respiratory: Negative for wheezing. Positive for cough productive of white sputum and breathing limited by pain. Cardiovascular: Negative for chest pain, palpitations. Positive for leg swelling. Gastrointestinal: Negative for abdominal pain, blood in stool, constipation, diarrhea, heartburn, nausea and vomiting. Genitourinary: Negative for dysuria and hematuria.    Musculoskeletal: Negative for myalgias. Positive for right sided rib pain and chronic back pain. Neurological: Negative for dizziness, focal weakness, weakness and headaches.  Physical Exam:  Vitals:   06/28/17 1003  BP: 114/88  Pulse: 90  Temp: 98.2 F (36.8 C)  TempSrc: Oral  SpO2: 100%  Weight: 267 lb 1.6 oz (121.2 kg)   GEN: Obese female sitting in chair in NAD. Alert and oriented. HENT: [place]/AT. Moist mucous membranes. No visible lesions. EYES: PERRL. Sclera non-icteric. Conjunctiva clear. RESP: Clear to auscultation bilaterally. No wheezes, rales, or rhonchi. No increased work of breathing. CV: Normal rate and regular rhythm. No murmurs, gallops, or rubs. 2+ BLE edema. ABD: Soft. Obese. Non-tender. Non-distended. Normoactive bowel sounds. EXT: 2+ BLE edema. Warm and well perfused. MSK: Tender to palpation on right flank/lower rib. No evidence of bruises or contusions on skin. SKIN: No evidence of erythema or rash on bilateral forearms. I do see what appears to be small scars from a possible previous scabies infection. NEURO: Cranial nerves II-XII grossly intact. Able to lift all four extremities against gravity. No apparent audiovisual hallucinations. Speech fluent and appropriate. PSYCH: Patient is calm and pleasant. Appropriate affect. Well-groomed; speech is appropriate and on-subject.  Assessment & Plan:   See Encounters Tab for problem based charting.  Patient seen with Dr. Dareen Piano

## 2017-06-28 NOTE — Patient Instructions (Addendum)
FOLLOW-UP INSTRUCTIONS When: 1 month with PCP For: Pain management and follow up of rib pain What to bring: medications  [name],  It was a pleasure to meet you today.  For your rib pain, you can use a heating pad on the area for 20 minutes 3 times a day. I have also sent in a prescription for Voltaren gel. Please apply this to the affected area as needed.  I have also given you 1 prescription for a 30 day supply of Percocet. I have refilled your gabapentin and flexeril for 30 days as well. Please follow up with your PCP, Dr. Benjamine Mola in 1 month for further refills.  For your itching, you can take cetirizine 5mg  once daily.  If you develop fevers or have pain when you pee, please call our clinic and come see [place].

## 2017-06-28 NOTE — Assessment & Plan Note (Addendum)
Assessment Patient states that she is out of Percocet. Her review of the [place] database, her last refill was December 3. She was last seen in clinic on November 2 by Dr. Benjamine Mola, who states that he provided one prescription for refills.  She also states that she feels itchy in the flexor areas and on her bilateral forearms. On exam, she doesn't have any signs of a rash or erythema that I can see. She states the last time she felt itchy, they gave her permethrin cream. Her feeling of itchiness may be from the opiates. Will try an antihistamine for her symptoms.  Plan - Provided one prescription of Percocet for 30 days - Refilled Flexeril for 30 days - Start cetirizine 5 mg daily - Follow up with Dr. Benjamine Mola in 1 month for further refills

## 2017-06-28 NOTE — Assessment & Plan Note (Addendum)
Assessment [name] complains of right-sided rib/flank pain for the last 1.5 weeks. She had a near fall around 1.5 weeks ago where she states that she may have fallen on her right side but she was able to catch herself before she fell. She denies loss of consciousness. She endorses subjective chills and cough. She denies chest pain or shortness of breath, although her breathing is limited by this pain. She denies dysuria, urinary frequency, or urinary urgency. Positive sick contacts, no recent travel. She is on flexeril, gabapentin, and percocet, which did provide some relief of her pain. However, she states that she is out of these medications and requests refills today. She also tried naproxen which did help. Her O2 saturation is 100%, HR 90, and she is afebrile. Lungs are clear on exam and the pain is reproducible on palpation. Based on her history, exam and stable vital signs, her pain is most likely musculoskeletal pain from her near fall.  Plan - Prescribed Voltaren gel 3 times daily on painful area - Recommended heating pad 3 times daily for 20 minutes - Follow-up in one month with PCP for follow-up of pain - Refilled Percocet for 30 days - Advised patient that if she develops fevers or dysuria, she should call the clinic

## 2017-06-28 NOTE — Assessment & Plan Note (Signed)
-   Refilled gabapentin for 30 days

## 2017-06-29 DIAGNOSIS — G609 Hereditary and idiopathic neuropathy, unspecified: Secondary | ICD-10-CM | POA: Diagnosis not present

## 2017-06-30 DIAGNOSIS — G609 Hereditary and idiopathic neuropathy, unspecified: Secondary | ICD-10-CM | POA: Diagnosis not present

## 2017-06-30 NOTE — Addendum Note (Signed)
Addended by: Aldine Contes on: 06/30/2017 10:22 AM   Modules accepted: Level of Service

## 2017-06-30 NOTE — Progress Notes (Signed)
Internal Medicine Clinic Attending  I saw and evaluated the patient.  I personally confirmed the key portions of the history and exam documented by Dr. Huang and I reviewed pertinent patient test results.  The assessment, diagnosis, and plan were formulated together and I agree with the documentation in the resident's note.  

## 2017-07-01 DIAGNOSIS — G609 Hereditary and idiopathic neuropathy, unspecified: Secondary | ICD-10-CM | POA: Diagnosis not present

## 2017-07-02 DIAGNOSIS — G609 Hereditary and idiopathic neuropathy, unspecified: Secondary | ICD-10-CM | POA: Diagnosis not present

## 2017-07-03 DIAGNOSIS — G609 Hereditary and idiopathic neuropathy, unspecified: Secondary | ICD-10-CM | POA: Diagnosis not present

## 2017-07-04 DIAGNOSIS — G609 Hereditary and idiopathic neuropathy, unspecified: Secondary | ICD-10-CM | POA: Diagnosis not present

## 2017-07-05 DIAGNOSIS — G609 Hereditary and idiopathic neuropathy, unspecified: Secondary | ICD-10-CM | POA: Diagnosis not present

## 2017-07-06 DIAGNOSIS — G609 Hereditary and idiopathic neuropathy, unspecified: Secondary | ICD-10-CM | POA: Diagnosis not present

## 2017-07-07 DIAGNOSIS — G609 Hereditary and idiopathic neuropathy, unspecified: Secondary | ICD-10-CM | POA: Diagnosis not present

## 2017-07-08 DIAGNOSIS — G609 Hereditary and idiopathic neuropathy, unspecified: Secondary | ICD-10-CM | POA: Diagnosis not present

## 2017-07-09 DIAGNOSIS — G609 Hereditary and idiopathic neuropathy, unspecified: Secondary | ICD-10-CM | POA: Diagnosis not present

## 2017-07-10 DIAGNOSIS — G609 Hereditary and idiopathic neuropathy, unspecified: Secondary | ICD-10-CM | POA: Diagnosis not present

## 2017-07-11 DIAGNOSIS — G609 Hereditary and idiopathic neuropathy, unspecified: Secondary | ICD-10-CM | POA: Diagnosis not present

## 2017-07-12 DIAGNOSIS — G609 Hereditary and idiopathic neuropathy, unspecified: Secondary | ICD-10-CM | POA: Diagnosis not present

## 2017-07-13 DIAGNOSIS — G609 Hereditary and idiopathic neuropathy, unspecified: Secondary | ICD-10-CM | POA: Diagnosis not present

## 2017-07-14 DIAGNOSIS — G609 Hereditary and idiopathic neuropathy, unspecified: Secondary | ICD-10-CM | POA: Diagnosis not present

## 2017-07-15 DIAGNOSIS — G609 Hereditary and idiopathic neuropathy, unspecified: Secondary | ICD-10-CM | POA: Diagnosis not present

## 2017-07-16 DIAGNOSIS — G609 Hereditary and idiopathic neuropathy, unspecified: Secondary | ICD-10-CM | POA: Diagnosis not present

## 2017-07-17 DIAGNOSIS — G609 Hereditary and idiopathic neuropathy, unspecified: Secondary | ICD-10-CM | POA: Diagnosis not present

## 2017-07-17 DIAGNOSIS — L97511 Non-pressure chronic ulcer of other part of right foot limited to breakdown of skin: Secondary | ICD-10-CM | POA: Diagnosis not present

## 2017-07-18 DIAGNOSIS — G609 Hereditary and idiopathic neuropathy, unspecified: Secondary | ICD-10-CM | POA: Diagnosis not present

## 2017-07-19 ENCOUNTER — Other Ambulatory Visit: Payer: Self-pay | Admitting: Internal Medicine

## 2017-07-19 DIAGNOSIS — E1140 Type 2 diabetes mellitus with diabetic neuropathy, unspecified: Secondary | ICD-10-CM

## 2017-07-19 DIAGNOSIS — IMO0002 Reserved for concepts with insufficient information to code with codable children: Secondary | ICD-10-CM

## 2017-07-19 DIAGNOSIS — G609 Hereditary and idiopathic neuropathy, unspecified: Secondary | ICD-10-CM | POA: Diagnosis not present

## 2017-07-19 DIAGNOSIS — E1165 Type 2 diabetes mellitus with hyperglycemia: Secondary | ICD-10-CM

## 2017-07-19 DIAGNOSIS — B86 Scabies: Secondary | ICD-10-CM

## 2017-07-19 NOTE — Telephone Encounter (Signed)
novolog requested earlier today, cream added to request

## 2017-07-19 NOTE — Telephone Encounter (Signed)
PATIENT NEEDS REFILL ON NOVOLOG, PERMETHRIN CREAM TO [name] [phone]

## 2017-07-20 DIAGNOSIS — G609 Hereditary and idiopathic neuropathy, unspecified: Secondary | ICD-10-CM | POA: Diagnosis not present

## 2017-07-20 MED ORDER — PERMETHRIN 5 % EX CREA: TOPICAL_CREAM | CUTANEOUS | 0 refills | Status: DC

## 2017-07-21 ENCOUNTER — Ambulatory Visit (INDEPENDENT_AMBULATORY_CARE_PROVIDER_SITE_OTHER): Payer: Medicare HMO | Admitting: Internal Medicine

## 2017-07-21 ENCOUNTER — Telehealth: Payer: Self-pay | Admitting: Dietician

## 2017-07-21 ENCOUNTER — Encounter: Payer: Self-pay | Admitting: Internal Medicine

## 2017-07-21 VITALS — BP 143/65 | HR 100 | Temp 98.0°F | Ht 65.0 in | Wt 266.3 lb

## 2017-07-21 DIAGNOSIS — G609 Hereditary and idiopathic neuropathy, unspecified: Secondary | ICD-10-CM | POA: Diagnosis not present

## 2017-07-21 DIAGNOSIS — IMO0002 Reserved for concepts with insufficient information to code with codable children: Secondary | ICD-10-CM

## 2017-07-21 DIAGNOSIS — M549 Dorsalgia, unspecified: Secondary | ICD-10-CM

## 2017-07-21 DIAGNOSIS — M7502 Adhesive capsulitis of left shoulder: Secondary | ICD-10-CM | POA: Diagnosis not present

## 2017-07-21 DIAGNOSIS — R0781 Pleurodynia: Secondary | ICD-10-CM

## 2017-07-21 DIAGNOSIS — E1140 Type 2 diabetes mellitus with diabetic neuropathy, unspecified: Secondary | ICD-10-CM

## 2017-07-21 DIAGNOSIS — I110 Hypertensive heart disease with heart failure: Secondary | ICD-10-CM | POA: Diagnosis not present

## 2017-07-21 DIAGNOSIS — M542 Cervicalgia: Secondary | ICD-10-CM | POA: Diagnosis not present

## 2017-07-21 DIAGNOSIS — M7501 Adhesive capsulitis of right shoulder: Secondary | ICD-10-CM | POA: Diagnosis not present

## 2017-07-21 DIAGNOSIS — I509 Heart failure, unspecified: Secondary | ICD-10-CM

## 2017-07-21 DIAGNOSIS — E119 Type 2 diabetes mellitus without complications: Secondary | ICD-10-CM

## 2017-07-21 DIAGNOSIS — Z79891 Long term (current) use of opiate analgesic: Secondary | ICD-10-CM | POA: Diagnosis not present

## 2017-07-21 DIAGNOSIS — M5440 Lumbago with sciatica, unspecified side: Secondary | ICD-10-CM

## 2017-07-21 DIAGNOSIS — I2510 Atherosclerotic heart disease of native coronary artery without angina pectoris: Secondary | ICD-10-CM | POA: Diagnosis not present

## 2017-07-21 DIAGNOSIS — E1165 Type 2 diabetes mellitus with hyperglycemia: Secondary | ICD-10-CM

## 2017-07-21 DIAGNOSIS — G8929 Other chronic pain: Secondary | ICD-10-CM

## 2017-07-21 MED ORDER — DICLOFENAC SODIUM 1 % TD GEL
2.0000 g | Freq: Three times a day (TID) | TRANSDERMAL | 0 refills | Status: DC | PRN
Start: 2017-07-21 — End: 2017-12-28

## 2017-07-21 NOTE — Progress Notes (Signed)
   CC: follow up of right rib/flank pain  HPI:  [name] is a 57 y.o. female with PMH significant for CAD, CHF, DM, HTN, and adhesive capsulitis who presents for follow-up of right rib/flank pain.  Right rib/flank pain: She was seen in clinic on 1/2 for right sided rib/flank pain that occurred after a near fall. She was given Voltaren gel and Percocet was refilled at that visit. She states that she has been using the Voltaren gel 1-2 times daily with improvement in the pain. She has not tried heating pads. She denies fevers, dysuria, or other urinary symptoms.  Chronic opioid therapy: Patient is on long-term opioid therapy for chronic back/neck pain. She is prescribed Percocet 5 mg 3 times a day when necessary and cyclobenzaprine 10 mg twice a day when necessary. Per [place] database, Percocet was filled on 1/2. She reports that she takes oxycodone and the last time she took this was 2 days ago. She also did take cyclobenzaprine recently. Last UDS performed on 04/27/2017 showed unexpected hydrocodone and no oxycodone.  Past Medical History:  Diagnosis Date  . Adhesive capsulitis of left shoulder    Steroid injection Dr. Truman Hayward 1/12  . Adhesive capsulitis of right shoulder    Steroid injection Dr. Truman Hayward 1/12  . Adhesive capsulitis of shoulder    bilateral, Steroid injection Dr. Truman Hayward 1/12 bilaterally  . CAD (coronary artery disease)    nonobstructive. Last cardiac cath (2008) showing left circumflex with mid 50% stenosis and distal luminal irregularities. Also with RCA with mid to distal 30-40% stenosis. // Previously evaluated by [facility], never followed up outpatient.  Marland Kitchen CAP (community acquired pneumonia) 06/15/2012   05/2012 CXR: Mild opacification of the posterior lung base on the lateral film  as cannot exclude infection/atelectasis.    . CHF (congestive heart failure) ([place])   . Diabetes mellitus 2007   Type II, insulin dependent  . Diabetic retinopathy   . GERD  (gastroesophageal reflux disease)   . Glaucoma   . Hyperlipidemia   . Hypertension   . Obesity   . Peripheral neuropathy    Review of Systems:   GEN: Negative for fevers CV: Negative for chest pain PULM: Negative for shortness of breath MSK: Positive for right rib/flank pain  Physical Exam:  Vitals:   07/21/17 1338  BP: (!) 143/65  Pulse: 100  Temp: 98 F (36.7 C)  TempSrc: Oral  SpO2: 100%  Weight: 266 lb 4.8 oz (120.8 kg)  Height: 5\' 5"  (1.651 m)   GEN: Sitting in chair in NAD CV: NR & RR, no m/r/g PULM: CTAB, no wheezes or rhonchi MSK: TTP on right flank/lower rib. No evidence of bruises or contusions on skin.  Assessment & Plan:   See Encounters Tab for problem based charting.  Patient discussed with Dr. Lynnae January

## 2017-07-21 NOTE — Telephone Encounter (Signed)
Called patient to be sure she is following up with foot doctors. She says she is and got special shoes and inserts and her foot is still healing.  She also asks for help getting shower chair and walker. Wanted to schedule an appointment with me. Will route this note to the front office to assist her with shower chair and walker and request referral for MNT and DSMT

## 2017-07-21 NOTE — Assessment & Plan Note (Signed)
Assessment She was seen in clinic on 1/2 for right sided rib/flank pain that occurred after a near fall. She was given Voltaren gel and Percocet was refilled at that visit. She states that she has been using the Voltaren gel 1-2 times daily with improvement in the pain. She has not tried heating pads. She denies fevers, dysuria, or other urinary symptoms.  Plan - Refilled Voltaren gel TID PRN on painful area - Recommended heating pad 3 times daily for 20 minutes - Follow up in 4-6 weeks with PCP

## 2017-07-21 NOTE — Assessment & Plan Note (Signed)
Assessment Patient is on long-term opioid therapy for chronic back/neck pain. She is prescribed Percocet 5 mg 3 times a day when necessary and cyclobenzaprine 10 mg twice a day when necessary. Per [place] database, Percocet was filled on 1/2. She reports that she takes oxycodone and the last time she took this was 2 days ago. She also did take cyclobenzaprine recently. Last UDS performed on 04/27/2017 showed unexpected hydrocodone and no oxycodone.  Plan - Check UDS today given previous abnormal UDS - Patient to follow up in 4-6 weeks with PCP

## 2017-07-21 NOTE — Patient Instructions (Addendum)
FOLLOW-UP INSTRUCTIONS When: 4-6 weeks with PCP For: Pain management and follow up of rib pain What to bring: medications    [name],  It was a pleasure to see you today.  I am glad to hear that your rib pain is doing better. For the pain, you can continue using Voltaren gel up to three times a day as needed. You can also try using a heating pad if that is helpful.  Please return to the clinic in 4-6 weeks to follow up with your primary doctor.

## 2017-07-22 DIAGNOSIS — G609 Hereditary and idiopathic neuropathy, unspecified: Secondary | ICD-10-CM | POA: Diagnosis not present

## 2017-07-24 DIAGNOSIS — G609 Hereditary and idiopathic neuropathy, unspecified: Secondary | ICD-10-CM | POA: Diagnosis not present

## 2017-07-25 ENCOUNTER — Ambulatory Visit: Payer: Medicare HMO | Admitting: Dietician

## 2017-07-25 DIAGNOSIS — G609 Hereditary and idiopathic neuropathy, unspecified: Secondary | ICD-10-CM | POA: Diagnosis not present

## 2017-07-25 NOTE — Progress Notes (Signed)
Internal Medicine Clinic Attending  Case discussed with Dr. Huang at the time of the visit.  We reviewed the resident's history and exam and pertinent patient test results.  I agree with the assessment, diagnosis, and plan of care documented in the resident's note. 

## 2017-07-26 ENCOUNTER — Other Ambulatory Visit: Payer: Self-pay | Admitting: Internal Medicine

## 2017-07-26 DIAGNOSIS — G609 Hereditary and idiopathic neuropathy, unspecified: Secondary | ICD-10-CM | POA: Diagnosis not present

## 2017-07-26 DIAGNOSIS — I10 Essential (primary) hypertension: Secondary | ICD-10-CM

## 2017-07-26 DIAGNOSIS — K219 Gastro-esophageal reflux disease without esophagitis: Secondary | ICD-10-CM

## 2017-07-26 NOTE — Telephone Encounter (Signed)
Spoke with the patient and she states she used to have a walker and would like a new one. PT also states she would like a Civil engineer, contracting to help prevent falls in her shower as well. Please advise if a DME request can be done for this patient.

## 2017-07-27 DIAGNOSIS — G609 Hereditary and idiopathic neuropathy, unspecified: Secondary | ICD-10-CM | POA: Diagnosis not present

## 2017-07-27 NOTE — Telephone Encounter (Signed)
Thanks.  Orders have been faxed to  [facility] @ [phone].  Patient may call their office back @ [phone] if they have not rec'd their items by next week.

## 2017-07-27 NOTE — Telephone Encounter (Signed)
I agree with shower chair and walker request. She has mobility limited by neuropathy, chronic back and leg pain related to cervical spinal stenosis and sciatica, and osteoarthritis of the knees.  I also agree CGM may be beneficial [name], as I have had difficulty identifying patterns to her blood sugars that explain her consistently elevated Hgb A1c. I will place a future order for this to be repeated whenever she can come and see [place] (last on 04/27/17). I will not have openings in the clinic until March at the soonest so if you can see her before then that would be great as well.

## 2017-07-28 ENCOUNTER — Other Ambulatory Visit: Payer: Self-pay

## 2017-07-28 DIAGNOSIS — G609 Hereditary and idiopathic neuropathy, unspecified: Secondary | ICD-10-CM | POA: Diagnosis not present

## 2017-07-28 DIAGNOSIS — I509 Heart failure, unspecified: Secondary | ICD-10-CM | POA: Diagnosis not present

## 2017-07-28 DIAGNOSIS — I10 Essential (primary) hypertension: Secondary | ICD-10-CM | POA: Diagnosis not present

## 2017-07-28 DIAGNOSIS — M545 Low back pain: Secondary | ICD-10-CM | POA: Diagnosis not present

## 2017-07-28 DIAGNOSIS — Z79891 Long term (current) use of opiate analgesic: Secondary | ICD-10-CM

## 2017-07-28 DIAGNOSIS — R269 Unspecified abnormalities of gait and mobility: Secondary | ICD-10-CM | POA: Diagnosis not present

## 2017-07-28 DIAGNOSIS — S73006A Unspecified dislocation of unspecified hip, initial encounter: Secondary | ICD-10-CM | POA: Diagnosis not present

## 2017-07-28 DIAGNOSIS — I259 Chronic ischemic heart disease, unspecified: Secondary | ICD-10-CM | POA: Diagnosis not present

## 2017-07-28 MED ORDER — OXYCODONE-ACETAMINOPHEN 5-325 MG PO TABS: 1.0000 | ORAL_TABLET | Freq: Three times a day (TID) | ORAL | 0 refills | Status: DC | PRN

## 2017-07-28 NOTE — Telephone Encounter (Signed)
oxyCODONE-acetaminophen (PERCOCET/ROXICET) 5-325 MG tablet   Refill request.  

## 2017-07-28 NOTE — Telephone Encounter (Signed)
Last rx written 06/28/17. Last OV 1/25 with Dr Ronalee Red. Next OV 2/8 in [place]. UDS 07/21/17.

## 2017-07-29 DIAGNOSIS — G609 Hereditary and idiopathic neuropathy, unspecified: Secondary | ICD-10-CM | POA: Diagnosis not present

## 2017-07-29 LAB — TOXASSURE SELECT,+ANTIDEPR,UR

## 2017-07-30 DIAGNOSIS — G609 Hereditary and idiopathic neuropathy, unspecified: Secondary | ICD-10-CM | POA: Diagnosis not present

## 2017-07-31 DIAGNOSIS — L97511 Non-pressure chronic ulcer of other part of right foot limited to breakdown of skin: Secondary | ICD-10-CM | POA: Diagnosis not present

## 2017-07-31 DIAGNOSIS — M2041 Other hammer toe(s) (acquired), right foot: Secondary | ICD-10-CM | POA: Diagnosis not present

## 2017-07-31 DIAGNOSIS — G609 Hereditary and idiopathic neuropathy, unspecified: Secondary | ICD-10-CM | POA: Diagnosis not present

## 2017-08-01 DIAGNOSIS — G609 Hereditary and idiopathic neuropathy, unspecified: Secondary | ICD-10-CM | POA: Diagnosis not present

## 2017-08-02 DIAGNOSIS — G609 Hereditary and idiopathic neuropathy, unspecified: Secondary | ICD-10-CM | POA: Diagnosis not present

## 2017-08-03 ENCOUNTER — Other Ambulatory Visit: Payer: Self-pay | Admitting: Internal Medicine

## 2017-08-03 DIAGNOSIS — E1165 Type 2 diabetes mellitus with hyperglycemia: Secondary | ICD-10-CM

## 2017-08-03 DIAGNOSIS — IMO0002 Reserved for concepts with insufficient information to code with codable children: Secondary | ICD-10-CM

## 2017-08-03 DIAGNOSIS — E1140 Type 2 diabetes mellitus with diabetic neuropathy, unspecified: Secondary | ICD-10-CM

## 2017-08-03 DIAGNOSIS — G609 Hereditary and idiopathic neuropathy, unspecified: Secondary | ICD-10-CM | POA: Diagnosis not present

## 2017-08-03 DIAGNOSIS — M542 Cervicalgia: Secondary | ICD-10-CM

## 2017-08-03 MED ORDER — CYCLOBENZAPRINE HCL 10 MG PO TABS: ORAL_TABLET | ORAL | 0 refills | Status: DC

## 2017-08-03 MED ORDER — GABAPENTIN 300 MG PO CAPS: 300.0000 mg | ORAL_CAPSULE | Freq: Three times a day (TID) | ORAL | 0 refills | Status: DC

## 2017-08-03 NOTE — Telephone Encounter (Signed)
Patient is requesting refills gabapentin and muscle relaxers

## 2017-08-04 ENCOUNTER — Encounter: Payer: Self-pay | Admitting: Internal Medicine

## 2017-08-04 ENCOUNTER — Encounter: Payer: Medicare HMO | Admitting: Dietician

## 2017-08-04 ENCOUNTER — Ambulatory Visit: Payer: Medicare HMO

## 2017-08-04 DIAGNOSIS — G609 Hereditary and idiopathic neuropathy, unspecified: Secondary | ICD-10-CM | POA: Diagnosis not present

## 2017-08-05 DIAGNOSIS — G609 Hereditary and idiopathic neuropathy, unspecified: Secondary | ICD-10-CM | POA: Diagnosis not present

## 2017-08-06 DIAGNOSIS — G609 Hereditary and idiopathic neuropathy, unspecified: Secondary | ICD-10-CM | POA: Diagnosis not present

## 2017-08-07 DIAGNOSIS — G609 Hereditary and idiopathic neuropathy, unspecified: Secondary | ICD-10-CM | POA: Diagnosis not present

## 2017-08-08 DIAGNOSIS — G609 Hereditary and idiopathic neuropathy, unspecified: Secondary | ICD-10-CM | POA: Diagnosis not present

## 2017-08-09 DIAGNOSIS — G609 Hereditary and idiopathic neuropathy, unspecified: Secondary | ICD-10-CM | POA: Diagnosis not present

## 2017-08-09 NOTE — Addendum Note (Signed)
Addended by: Hulan Fray on: 08/09/2017 08:02 PM   Modules accepted: Orders

## 2017-08-10 DIAGNOSIS — G609 Hereditary and idiopathic neuropathy, unspecified: Secondary | ICD-10-CM | POA: Diagnosis not present

## 2017-08-11 DIAGNOSIS — G609 Hereditary and idiopathic neuropathy, unspecified: Secondary | ICD-10-CM | POA: Diagnosis not present

## 2017-08-12 ENCOUNTER — Other Ambulatory Visit: Payer: Self-pay | Admitting: Internal Medicine

## 2017-08-12 DIAGNOSIS — G609 Hereditary and idiopathic neuropathy, unspecified: Secondary | ICD-10-CM | POA: Diagnosis not present

## 2017-08-12 DIAGNOSIS — K219 Gastro-esophageal reflux disease without esophagitis: Secondary | ICD-10-CM

## 2017-08-12 DIAGNOSIS — M542 Cervicalgia: Secondary | ICD-10-CM

## 2017-08-12 DIAGNOSIS — I2510 Atherosclerotic heart disease of native coronary artery without angina pectoris: Secondary | ICD-10-CM

## 2017-08-12 DIAGNOSIS — I10 Essential (primary) hypertension: Secondary | ICD-10-CM

## 2017-08-13 DIAGNOSIS — G609 Hereditary and idiopathic neuropathy, unspecified: Secondary | ICD-10-CM | POA: Diagnosis not present

## 2017-08-14 DIAGNOSIS — G609 Hereditary and idiopathic neuropathy, unspecified: Secondary | ICD-10-CM | POA: Diagnosis not present

## 2017-08-15 DIAGNOSIS — G609 Hereditary and idiopathic neuropathy, unspecified: Secondary | ICD-10-CM | POA: Diagnosis not present

## 2017-08-16 DIAGNOSIS — G609 Hereditary and idiopathic neuropathy, unspecified: Secondary | ICD-10-CM | POA: Diagnosis not present

## 2017-08-17 DIAGNOSIS — G609 Hereditary and idiopathic neuropathy, unspecified: Secondary | ICD-10-CM | POA: Diagnosis not present

## 2017-08-18 DIAGNOSIS — G609 Hereditary and idiopathic neuropathy, unspecified: Secondary | ICD-10-CM | POA: Diagnosis not present

## 2017-08-19 DIAGNOSIS — G609 Hereditary and idiopathic neuropathy, unspecified: Secondary | ICD-10-CM | POA: Diagnosis not present

## 2017-08-20 DIAGNOSIS — G609 Hereditary and idiopathic neuropathy, unspecified: Secondary | ICD-10-CM | POA: Diagnosis not present

## 2017-08-21 DIAGNOSIS — G609 Hereditary and idiopathic neuropathy, unspecified: Secondary | ICD-10-CM | POA: Diagnosis not present

## 2017-08-22 DIAGNOSIS — G609 Hereditary and idiopathic neuropathy, unspecified: Secondary | ICD-10-CM | POA: Diagnosis not present

## 2017-08-23 ENCOUNTER — Other Ambulatory Visit: Payer: Self-pay

## 2017-08-23 ENCOUNTER — Encounter: Payer: Self-pay | Admitting: Internal Medicine

## 2017-08-23 ENCOUNTER — Ambulatory Visit (INDEPENDENT_AMBULATORY_CARE_PROVIDER_SITE_OTHER): Payer: Medicare HMO | Admitting: Internal Medicine

## 2017-08-23 VITALS — BP 132/65 | HR 107 | Temp 98.2°F | Ht 66.0 in | Wt 262.0 lb

## 2017-08-23 DIAGNOSIS — R110 Nausea: Secondary | ICD-10-CM

## 2017-08-23 DIAGNOSIS — R05 Cough: Secondary | ICD-10-CM | POA: Diagnosis not present

## 2017-08-23 DIAGNOSIS — R109 Unspecified abdominal pain: Secondary | ICD-10-CM | POA: Diagnosis not present

## 2017-08-23 DIAGNOSIS — R000 Tachycardia, unspecified: Secondary | ICD-10-CM

## 2017-08-23 DIAGNOSIS — R531 Weakness: Secondary | ICD-10-CM | POA: Diagnosis not present

## 2017-08-23 DIAGNOSIS — R6883 Chills (without fever): Secondary | ICD-10-CM

## 2017-08-23 DIAGNOSIS — R5383 Other fatigue: Secondary | ICD-10-CM

## 2017-08-23 DIAGNOSIS — R0602 Shortness of breath: Secondary | ICD-10-CM | POA: Diagnosis not present

## 2017-08-23 DIAGNOSIS — G609 Hereditary and idiopathic neuropathy, unspecified: Secondary | ICD-10-CM | POA: Diagnosis not present

## 2017-08-23 DIAGNOSIS — R059 Cough, unspecified: Secondary | ICD-10-CM | POA: Insufficient documentation

## 2017-08-23 DIAGNOSIS — R093 Abnormal sputum: Secondary | ICD-10-CM | POA: Diagnosis not present

## 2017-08-23 DIAGNOSIS — R5381 Other malaise: Secondary | ICD-10-CM | POA: Diagnosis not present

## 2017-08-23 MED ORDER — BENZONATATE 100 MG PO CAPS: 100.0000 mg | ORAL_CAPSULE | Freq: Three times a day (TID) | ORAL | 0 refills | Status: DC | PRN

## 2017-08-23 MED ORDER — SALINE SPRAY 0.65 % NA SOLN: 1.0000 | NASAL | 0 refills | Status: DC | PRN

## 2017-08-23 NOTE — Patient Instructions (Signed)
[name],   You likely have a viral infection as the cause of your symptoms. We recommend treatment for your symptoms for this.   You can try over the counter mucinex. I will prescribe you tessalon pearls for your cough and nasal spray for congestions.   Please return to clinic if your symptoms do not improve within the next 1-2 weeks.   Please call us if you have any questions.

## 2017-08-24 ENCOUNTER — Encounter: Payer: Self-pay | Admitting: Internal Medicine

## 2017-08-24 DIAGNOSIS — G609 Hereditary and idiopathic neuropathy, unspecified: Secondary | ICD-10-CM | POA: Diagnosis not present

## 2017-08-24 NOTE — Progress Notes (Signed)
Internal Medicine Clinic Attending  Case discussed with Dr. Santos-Sanchez at the time of the visit.  We reviewed the resident's history and exam and pertinent patient test results.  I agree with the assessment, diagnosis, and plan of care documented in the resident's note.    

## 2017-08-24 NOTE — Progress Notes (Signed)
   CC: Cough and body aches     HPI:  [name] is a 57 y.o. female with PMH listed below who presents to clinic with a 4-day history of flu-like symptoms. Please see problem based assessment and plan for further details.   Past Medical History:  Diagnosis Date  . Adhesive capsulitis of left shoulder    Steroid injection Dr. Truman Hayward 1/12  . Adhesive capsulitis of right shoulder    Steroid injection Dr. Truman Hayward 1/12  . Adhesive capsulitis of shoulder    bilateral, Steroid injection Dr. Truman Hayward 1/12 bilaterally  . CAD (coronary artery disease)    nonobstructive. Last cardiac cath (2008) showing left circumflex with mid 50% stenosis and distal luminal irregularities. Also with RCA with mid to distal 30-40% stenosis. // Previously evaluated by [facility], never followed up outpatient.  Marland Kitchen CAP (community acquired pneumonia) 06/15/2012   05/2012 CXR: Mild opacification of the posterior lung base on the lateral film  as cannot exclude infection/atelectasis.    . CHF (congestive heart failure) ([place])   . Diabetes mellitus 2007   Type II, insulin dependent  . Diabetic retinopathy   . GERD (gastroesophageal reflux disease)   . Glaucoma   . Hyperlipidemia   . Hypertension   . Obesity   . Peripheral neuropathy    Review of Systems:   Review of Systems  Constitutional: Positive for chills and malaise/fatigue. Negative for fever.  Respiratory: Positive for cough, sputum production and shortness of breath.   Cardiovascular: Negative for chest pain.  Gastrointestinal: Positive for abdominal pain and nausea. Negative for vomiting.  Neurological: Positive for weakness.    Physical Exam:  Vitals:   08/23/17 0907  BP: 132/65  Pulse: (!) 107  Temp: 98.2 F (36.8 C)  TempSrc: Oral  SpO2: 99%  Weight: 262 lb (118.8 kg)  Height: 5\' 6"  (1.676 m)   General: pleasant female, obese, well-developed, able to speak in full sentences in no acute distress  HENT: neck supple and FROM, OP  clear without exudates or erythema Cardiac: tachycardic and regular rhythm, nl S1/S2, no murmurs, rubs or gallops  Pulm: CTAB, no wheezes or crackles, no increased work of breathing  Abd: soft, NTND, normoactive bowel sounds    Assessment & Plan:   See Encounters Tab for problem based charting.  Patient discussed with Dr. Dareen Piano

## 2017-08-24 NOTE — Assessment & Plan Note (Addendum)
Patient presents with a 4-day history of flulike symptoms.  States she has been having a productive cough of yellow sputum, nasal congestion, headache, and myalgias for the past 4 days.  States symptoms worsen at night and early in the morning.  She also endorses abdominal pain, nausea, and decreased appetite.  Denies fever, chills, and sick contacts.  She does not have a history of asthma.  She has not tried any over-the-counter medications for his symptoms.  On exam she is afebrile and satting well on room air.  She appears comfortable and is able to speak in full sentences.  Lungs are clear to auscultation. Suspect this is likely a viral URI and expect resolution of symptoms within the next week. Low suspicion for lowe respiratory process such as pneumonia at this time. Will manage conservatively as below.   - Tessalon pearls  - Mucinex  - Ocean nasal spray

## 2017-08-25 ENCOUNTER — Other Ambulatory Visit: Payer: Self-pay

## 2017-08-25 DIAGNOSIS — Z79891 Long term (current) use of opiate analgesic: Secondary | ICD-10-CM

## 2017-08-25 DIAGNOSIS — G609 Hereditary and idiopathic neuropathy, unspecified: Secondary | ICD-10-CM | POA: Diagnosis not present

## 2017-08-25 MED ORDER — OXYCODONE-ACETAMINOPHEN 5-325 MG PO TABS: 1.0000 | ORAL_TABLET | Freq: Three times a day (TID) | ORAL | 0 refills | Status: AC | PRN

## 2017-08-25 NOTE — Telephone Encounter (Signed)
oxyCODONE-acetaminophen (PERCOCET/ROXICET) 5-325 MG tablet   Refill request.  

## 2017-08-25 NOTE — Telephone Encounter (Signed)
Last rx written 07/28/17. Last OV 2/27 with Dr Isac Sarna. UDS 07/21/17.

## 2017-08-26 ENCOUNTER — Other Ambulatory Visit: Payer: Self-pay | Admitting: Internal Medicine

## 2017-08-26 DIAGNOSIS — G609 Hereditary and idiopathic neuropathy, unspecified: Secondary | ICD-10-CM | POA: Diagnosis not present

## 2017-08-27 DIAGNOSIS — G609 Hereditary and idiopathic neuropathy, unspecified: Secondary | ICD-10-CM | POA: Diagnosis not present

## 2017-08-28 DIAGNOSIS — G609 Hereditary and idiopathic neuropathy, unspecified: Secondary | ICD-10-CM | POA: Diagnosis not present

## 2017-08-29 DIAGNOSIS — G609 Hereditary and idiopathic neuropathy, unspecified: Secondary | ICD-10-CM | POA: Diagnosis not present

## 2017-08-30 DIAGNOSIS — G609 Hereditary and idiopathic neuropathy, unspecified: Secondary | ICD-10-CM | POA: Diagnosis not present

## 2017-08-31 DIAGNOSIS — G609 Hereditary and idiopathic neuropathy, unspecified: Secondary | ICD-10-CM | POA: Diagnosis not present

## 2017-09-01 DIAGNOSIS — G609 Hereditary and idiopathic neuropathy, unspecified: Secondary | ICD-10-CM | POA: Diagnosis not present

## 2017-09-02 DIAGNOSIS — G609 Hereditary and idiopathic neuropathy, unspecified: Secondary | ICD-10-CM | POA: Diagnosis not present

## 2017-09-03 DIAGNOSIS — G609 Hereditary and idiopathic neuropathy, unspecified: Secondary | ICD-10-CM | POA: Diagnosis not present

## 2017-09-04 DIAGNOSIS — G609 Hereditary and idiopathic neuropathy, unspecified: Secondary | ICD-10-CM | POA: Diagnosis not present

## 2017-09-05 DIAGNOSIS — G609 Hereditary and idiopathic neuropathy, unspecified: Secondary | ICD-10-CM | POA: Diagnosis not present

## 2017-09-06 DIAGNOSIS — G609 Hereditary and idiopathic neuropathy, unspecified: Secondary | ICD-10-CM | POA: Diagnosis not present

## 2017-09-07 DIAGNOSIS — G609 Hereditary and idiopathic neuropathy, unspecified: Secondary | ICD-10-CM | POA: Diagnosis not present

## 2017-09-08 DIAGNOSIS — G609 Hereditary and idiopathic neuropathy, unspecified: Secondary | ICD-10-CM | POA: Diagnosis not present

## 2017-09-09 DIAGNOSIS — G609 Hereditary and idiopathic neuropathy, unspecified: Secondary | ICD-10-CM | POA: Diagnosis not present

## 2017-09-10 DIAGNOSIS — G609 Hereditary and idiopathic neuropathy, unspecified: Secondary | ICD-10-CM | POA: Diagnosis not present

## 2017-09-11 ENCOUNTER — Other Ambulatory Visit: Payer: Self-pay | Admitting: Internal Medicine

## 2017-09-11 DIAGNOSIS — I10 Essential (primary) hypertension: Secondary | ICD-10-CM

## 2017-09-11 DIAGNOSIS — G609 Hereditary and idiopathic neuropathy, unspecified: Secondary | ICD-10-CM | POA: Diagnosis not present

## 2017-09-11 DIAGNOSIS — M542 Cervicalgia: Secondary | ICD-10-CM

## 2017-09-11 MED ORDER — LISINOPRIL 20 MG PO TABS: 20.0000 mg | ORAL_TABLET | Freq: Every day | ORAL | 1 refills | Status: DC

## 2017-09-11 NOTE — Telephone Encounter (Signed)
Spoke with pt. States she had been taking 1/2 tab (10 mg total) of lisinopril until last week when she noted her BP was elevated (170/92) and had a headache. She increased lisinopril at that time to 1 tab (20 mg total) and today's BP 128/72 and no more headache. Discussed always calling office prior to making any medication changes in future. Patient states she will. Hubbard Hartshorn, RN, BSN

## 2017-09-11 NOTE — Addendum Note (Signed)
Addended by: Larey Dresser A on: 09/11/2017 02:07 PM   Modules accepted: Orders

## 2017-09-11 NOTE — Telephone Encounter (Signed)
BP recent appt OK. K Cr OK

## 2017-09-11 NOTE — Telephone Encounter (Signed)
Must sch May appt PCP opioid refills. Getting the flexeril #60 basically monthly.

## 2017-09-11 NOTE — Telephone Encounter (Signed)
Confusion in notes and med rec - to be on 20 mg or 10 mg(1/2 pill)? Would you pls clarify with pt?

## 2017-09-12 DIAGNOSIS — G609 Hereditary and idiopathic neuropathy, unspecified: Secondary | ICD-10-CM | POA: Diagnosis not present

## 2017-09-13 DIAGNOSIS — G609 Hereditary and idiopathic neuropathy, unspecified: Secondary | ICD-10-CM | POA: Diagnosis not present

## 2017-09-14 DIAGNOSIS — G609 Hereditary and idiopathic neuropathy, unspecified: Secondary | ICD-10-CM | POA: Diagnosis not present

## 2017-09-15 DIAGNOSIS — G609 Hereditary and idiopathic neuropathy, unspecified: Secondary | ICD-10-CM | POA: Diagnosis not present

## 2017-09-16 DIAGNOSIS — G609 Hereditary and idiopathic neuropathy, unspecified: Secondary | ICD-10-CM | POA: Diagnosis not present

## 2017-09-17 DIAGNOSIS — G609 Hereditary and idiopathic neuropathy, unspecified: Secondary | ICD-10-CM | POA: Diagnosis not present

## 2017-09-18 DIAGNOSIS — G609 Hereditary and idiopathic neuropathy, unspecified: Secondary | ICD-10-CM | POA: Diagnosis not present

## 2017-09-19 DIAGNOSIS — G609 Hereditary and idiopathic neuropathy, unspecified: Secondary | ICD-10-CM | POA: Diagnosis not present

## 2017-09-20 ENCOUNTER — Other Ambulatory Visit: Payer: Self-pay | Admitting: Internal Medicine

## 2017-09-20 DIAGNOSIS — G609 Hereditary and idiopathic neuropathy, unspecified: Secondary | ICD-10-CM | POA: Diagnosis not present

## 2017-09-21 DIAGNOSIS — G609 Hereditary and idiopathic neuropathy, unspecified: Secondary | ICD-10-CM | POA: Diagnosis not present

## 2017-09-22 DIAGNOSIS — G609 Hereditary and idiopathic neuropathy, unspecified: Secondary | ICD-10-CM | POA: Diagnosis not present

## 2017-09-23 DIAGNOSIS — G609 Hereditary and idiopathic neuropathy, unspecified: Secondary | ICD-10-CM | POA: Diagnosis not present

## 2017-09-24 DIAGNOSIS — G609 Hereditary and idiopathic neuropathy, unspecified: Secondary | ICD-10-CM | POA: Diagnosis not present

## 2017-09-25 ENCOUNTER — Other Ambulatory Visit: Payer: Self-pay

## 2017-09-25 DIAGNOSIS — G609 Hereditary and idiopathic neuropathy, unspecified: Secondary | ICD-10-CM | POA: Diagnosis not present

## 2017-09-25 DIAGNOSIS — B86 Scabies: Secondary | ICD-10-CM

## 2017-09-25 NOTE — Telephone Encounter (Signed)
permethrin (ELIMITE) 5 % cream   Refill request @ [facility] on [address].

## 2017-09-25 NOTE — Telephone Encounter (Signed)
Insulin Glargine (LANTUS SOLOSTAR) 100 UNIT/ML Solostar Pen, and oxycodone to be filled. Pt is using [facility] on [address].

## 2017-09-26 DIAGNOSIS — G609 Hereditary and idiopathic neuropathy, unspecified: Secondary | ICD-10-CM | POA: Diagnosis not present

## 2017-09-26 NOTE — Telephone Encounter (Signed)
Pt requesting to speak with a nurse. Please call pt back.

## 2017-09-27 ENCOUNTER — Telehealth: Payer: Self-pay | Admitting: Internal Medicine

## 2017-09-27 DIAGNOSIS — Z79891 Long term (current) use of opiate analgesic: Secondary | ICD-10-CM

## 2017-09-27 DIAGNOSIS — G609 Hereditary and idiopathic neuropathy, unspecified: Secondary | ICD-10-CM | POA: Diagnosis not present

## 2017-09-27 MED ORDER — PERMETHRIN 5 % EX CREA: TOPICAL_CREAM | CUTANEOUS | 3 refills | Status: DC

## 2017-09-27 MED ORDER — INSULIN GLARGINE 100 UNIT/ML SOLOSTAR PEN: 44.0000 [IU] | PEN_INJECTOR | Freq: Every day | SUBCUTANEOUS | 5 refills | Status: DC

## 2017-09-27 NOTE — Telephone Encounter (Signed)
Pt would like to know if Oxycodone is ready. Please call pt back.

## 2017-09-27 NOTE — Telephone Encounter (Signed)
Patient is calling back checking on medicine, patient called on Monday and havne't heard anything. Pls call pt

## 2017-09-27 NOTE — Telephone Encounter (Signed)
Last office visit: 08/23/17 w/[facility] and  04/27/17 w/pcp Last UDS:07/21/2017 Last Refill: 08/25/17 Next appt: 11/10/17

## 2017-09-28 ENCOUNTER — Other Ambulatory Visit: Payer: Self-pay | Admitting: Internal Medicine

## 2017-09-28 DIAGNOSIS — G609 Hereditary and idiopathic neuropathy, unspecified: Secondary | ICD-10-CM | POA: Diagnosis not present

## 2017-09-28 MED ORDER — OXYCODONE HCL 5 MG PO TABS: 5.0000 mg | ORAL_TABLET | Freq: Three times a day (TID) | ORAL | 0 refills | Status: DC | PRN

## 2017-09-28 NOTE — Telephone Encounter (Signed)
Rx is sent to her pharmacy now.

## 2017-09-28 NOTE — Telephone Encounter (Signed)
Pt called / informed pain med has been refilled.

## 2017-09-28 NOTE — Telephone Encounter (Signed)
Patient is requesting a refill on pain medicine  °

## 2017-09-28 NOTE — Telephone Encounter (Signed)
Pt is calling back to speak with a nurse about getting Oxycodone.

## 2017-09-28 NOTE — Telephone Encounter (Signed)
Has been done, pt informed

## 2017-09-29 DIAGNOSIS — G609 Hereditary and idiopathic neuropathy, unspecified: Secondary | ICD-10-CM | POA: Diagnosis not present

## 2017-09-30 DIAGNOSIS — G609 Hereditary and idiopathic neuropathy, unspecified: Secondary | ICD-10-CM | POA: Diagnosis not present

## 2017-10-01 DIAGNOSIS — G609 Hereditary and idiopathic neuropathy, unspecified: Secondary | ICD-10-CM | POA: Diagnosis not present

## 2017-10-02 DIAGNOSIS — G609 Hereditary and idiopathic neuropathy, unspecified: Secondary | ICD-10-CM | POA: Diagnosis not present

## 2017-10-03 DIAGNOSIS — G609 Hereditary and idiopathic neuropathy, unspecified: Secondary | ICD-10-CM | POA: Diagnosis not present

## 2017-10-04 DIAGNOSIS — G609 Hereditary and idiopathic neuropathy, unspecified: Secondary | ICD-10-CM | POA: Diagnosis not present

## 2017-10-05 DIAGNOSIS — G609 Hereditary and idiopathic neuropathy, unspecified: Secondary | ICD-10-CM | POA: Diagnosis not present

## 2017-10-06 ENCOUNTER — Telehealth: Payer: Self-pay | Admitting: *Deleted

## 2017-10-06 DIAGNOSIS — M2041 Other hammer toe(s) (acquired), right foot: Secondary | ICD-10-CM | POA: Diagnosis not present

## 2017-10-06 DIAGNOSIS — L97512 Non-pressure chronic ulcer of other part of right foot with fat layer exposed: Secondary | ICD-10-CM | POA: Diagnosis not present

## 2017-10-06 DIAGNOSIS — G609 Hereditary and idiopathic neuropathy, unspecified: Secondary | ICD-10-CM | POA: Diagnosis not present

## 2017-10-06 NOTE — Telephone Encounter (Signed)
Talked to Dr Benjamine Mola - stated he did not intentionally change her pain medication. But pt has to bring the current pain medication to the office before he would write another one for Oxycodone 5/325 mg. Unless she wanted to wait until next refill date; unsure why she's c/o itching.  I called pt - explained the above to her. Stated she has been taking Benadryl for the itching; took some this am. Unsure if she wanted to wait until next refill. Also she's c/o congestion/rib pain/cough- worse than before;requesting an appt for next week. Stated she will bring what is left in the bottle to the appt. [place] office scheduled her an appt in [facility] on 4/17@ 0845 Am.

## 2017-10-06 NOTE — Telephone Encounter (Signed)
Call from pt wanting to know if Dr Benjamine Mola had changed her pain medication . The ones she picked up was Oxycodone 5 mg and they are making her itch. She has been taking Oxycodone 5-325 mg. Stated it was not discussed about changing her medication. Thanks

## 2017-10-07 DIAGNOSIS — G609 Hereditary and idiopathic neuropathy, unspecified: Secondary | ICD-10-CM | POA: Diagnosis not present

## 2017-10-08 DIAGNOSIS — G609 Hereditary and idiopathic neuropathy, unspecified: Secondary | ICD-10-CM | POA: Diagnosis not present

## 2017-10-09 DIAGNOSIS — G609 Hereditary and idiopathic neuropathy, unspecified: Secondary | ICD-10-CM | POA: Diagnosis not present

## 2017-10-10 DIAGNOSIS — G609 Hereditary and idiopathic neuropathy, unspecified: Secondary | ICD-10-CM | POA: Diagnosis not present

## 2017-10-11 ENCOUNTER — Ambulatory Visit (INDEPENDENT_AMBULATORY_CARE_PROVIDER_SITE_OTHER): Payer: Medicare HMO | Admitting: Internal Medicine

## 2017-10-11 ENCOUNTER — Encounter: Payer: Self-pay | Admitting: Internal Medicine

## 2017-10-11 ENCOUNTER — Other Ambulatory Visit: Payer: Self-pay | Admitting: Internal Medicine

## 2017-10-11 ENCOUNTER — Ambulatory Visit (HOSPITAL_COMMUNITY)
Admission: RE | Admit: 2017-10-11 | Discharge: 2017-10-11 | Disposition: A | Discharge status: Home / Self Care | Payer: Medicare HMO | Source: Ambulatory Visit | Attending: Student in an Organized Health Care Education/Training Program | Admitting: Student in an Organized Health Care Education/Training Program

## 2017-10-11 ENCOUNTER — Other Ambulatory Visit: Payer: Self-pay

## 2017-10-11 VITALS — BP 154/74 | HR 94 | Temp 100.0°F | Ht 66.0 in | Wt 265.2 lb

## 2017-10-11 DIAGNOSIS — R05 Cough: Secondary | ICD-10-CM | POA: Insufficient documentation

## 2017-10-11 DIAGNOSIS — R059 Cough, unspecified: Secondary | ICD-10-CM

## 2017-10-11 DIAGNOSIS — R079 Chest pain, unspecified: Secondary | ICD-10-CM | POA: Diagnosis not present

## 2017-10-11 DIAGNOSIS — J984 Other disorders of lung: Secondary | ICD-10-CM | POA: Insufficient documentation

## 2017-10-11 DIAGNOSIS — R0601 Orthopnea: Secondary | ICD-10-CM

## 2017-10-11 DIAGNOSIS — Z79899 Other long term (current) drug therapy: Secondary | ICD-10-CM

## 2017-10-11 DIAGNOSIS — E119 Type 2 diabetes mellitus without complications: Secondary | ICD-10-CM | POA: Diagnosis not present

## 2017-10-11 DIAGNOSIS — IMO0002 Reserved for concepts with insufficient information to code with codable children: Secondary | ICD-10-CM

## 2017-10-11 DIAGNOSIS — E1140 Type 2 diabetes mellitus with diabetic neuropathy, unspecified: Secondary | ICD-10-CM

## 2017-10-11 DIAGNOSIS — I110 Hypertensive heart disease with heart failure: Secondary | ICD-10-CM

## 2017-10-11 DIAGNOSIS — G609 Hereditary and idiopathic neuropathy, unspecified: Secondary | ICD-10-CM | POA: Diagnosis not present

## 2017-10-11 DIAGNOSIS — I5020 Unspecified systolic (congestive) heart failure: Secondary | ICD-10-CM

## 2017-10-11 DIAGNOSIS — E1165 Type 2 diabetes mellitus with hyperglycemia: Secondary | ICD-10-CM

## 2017-10-11 DIAGNOSIS — R0600 Dyspnea, unspecified: Secondary | ICD-10-CM

## 2017-10-11 DIAGNOSIS — I2510 Atherosclerotic heart disease of native coronary artery without angina pectoris: Secondary | ICD-10-CM | POA: Diagnosis not present

## 2017-10-11 MED ORDER — AZITHROMYCIN 250 MG PO TABS: ORAL_TABLET | ORAL | 0 refills | Status: AC

## 2017-10-11 NOTE — Patient Instructions (Addendum)
FOLLOW-UP INSTRUCTIONS When: 11/10/2017 (appt already scheduled) For: BP management What to bring: medications   Ms. Henne,  It was good to see you again today.  We are going to get a chest xray to look at your lungs. I want to look at the x-ray first, and then I will call in an antibiotic for you to pick up.  You most likely have extra fluid on you - Please increase your lasix to 40mg  every day for 7 days (4/17 - 4/23). After that, go back down to 20mg  once a day.  We are checking some blood work today. I will let you know the results when I get them back.  Please come back to see [place] on 11/10/2017 or sooner if needed.  If you have any questions or concerns, call our clinic at [phone] or after hours call [phone] and ask for the internal medicine resident on call.

## 2017-10-11 NOTE — Progress Notes (Signed)
   CC: cough and congestion  HPI:  [name] is a 57 y.o. female with PMH of CAD, HFpEF (TTE 01/2017: LVEF 50-55%, grade 1 DD), DM, and HTN who presents for follow-up of cough and congestion.  Cough: She was seen in clinic on 08/23/2017 with cough and congestion. She was treated symptomatically for probable viral URI at that time. Since then, she reports she has not gotten any better and feels worse. She states the Tessalon pearls and Mucinex were not helpful. She now reports headache, dry cough, and chest congestion that prevents her from being able to get good sleep. She feels short of breath and feels like her breathing is "rattling". She endorses some sore throat, subjective fevers/chills, and palpitations. She denies chest pain. She has not been eating much but has been drinking fluids. +Sick contacts. No recent travel.  CHF: She states she has not been able to take her lasix every day, but has been taking it probably every other day. Endorses leg swelling. Her weight today is around the same as it has been - 265lbs today, compared to 266 in January and 262 in February. She also endorses feeling orthostatic with standing. Endorses orthopnea and DOE. No PND.  Please see the assessment and plan below for the status of the patient's chronic medical problems.  Past Medical History:  Diagnosis Date  . Adhesive capsulitis of left shoulder    Steroid injection Dr. Truman Hayward 1/12  . Adhesive capsulitis of right shoulder    Steroid injection Dr. Truman Hayward 1/12  . Adhesive capsulitis of shoulder    bilateral, Steroid injection Dr. Truman Hayward 1/12 bilaterally  . CAD (coronary artery disease)    nonobstructive. Last cardiac cath (2008) showing left circumflex with mid 50% stenosis and distal luminal irregularities. Also with RCA with mid to distal 30-40% stenosis. // Previously evaluated by [facility], never followed up outpatient.  Marland Kitchen CAP (community acquired pneumonia) 06/15/2012   05/2012 CXR: Mild  opacification of the posterior lung base on the lateral film  as cannot exclude infection/atelectasis.    . CHF (congestive heart failure) ([place])   . Diabetes mellitus 2007   Type II, insulin dependent  . Diabetic retinopathy   . GERD (gastroesophageal reflux disease)   . Glaucoma   . Hyperlipidemia   . Hypertension   . Obesity   . Peripheral neuropathy    Review of Systems:   Negative except as per HPI  Physical Exam:  Vitals:   10/11/17 0857  BP: (!) 154/74  Pulse: 94  Temp: 100 F (37.8 C)  TempSrc: Oral  SpO2: 100%  Weight: 265 lb 3.2 oz (120.3 kg)  Height: 5\' 6"  (1.676 m)   GEN: Sitting in chair, appears uncomfortable but in NAD HENT: Mild sinus soreness to palpation. No erythema of posterior pharynx. MMM. EYES: PERRL. No conjunctival injection. NECK: No cervical LAD CV: NR & RR, no m/r/g PULM: Diffuse expiratory wheezes. No crackles LE: 2+ BLE edema. No skin breaks or open wounds on bilateral feet  Assessment & Plan:   See Encounters Tab for problem based charting.  Patient discussed with Dr. Evette Doffing

## 2017-10-11 NOTE — Progress Notes (Signed)
Internal Medicine Clinic Attending  Case discussed with Dr. Huang at the time of the visit.  We reviewed the resident's history and exam and pertinent patient test results.  I agree with the assessment, diagnosis, and plan of care documented in the resident's note. 

## 2017-10-11 NOTE — Addendum Note (Signed)
Addended by: Oretha Caprice on: 10/11/2017 11:52 AM   Modules accepted: Orders

## 2017-10-11 NOTE — Assessment & Plan Note (Addendum)
Assessment Seen in clinic 08/23/2017 for probable viral URI and since then, reports that her symptoms have not improved with symptomatic treatment and have actually worsened. Continues to have dry cough, chest congestion, subjective fevers/chills, and fatigue. She has been tolerating fluid intake. Orthostatics negative in clinic. Temperature 100F with 100% O2 saturation on RA. Lung exam notable for diffuse wheezes, no crackles appreciated. Given persistence of symptoms, will check CXR and prescribe antibiotics pending CXR results. Patient agreeable to this plan  Plan - Check CBC and BMP today - CXR ordered --- If no consolidation, will call in azithromycin --- If evidence of consolidation, will start levofloxacin - RTC if no improvement  ADDENDUM: CXR shows bronchitic changes with scarring in left mid-lung, no evidence of consolidation. Will prescribe azithromycin x5d. Called patient to inform her and to let her know Rx is available to be picked up.

## 2017-10-11 NOTE — Assessment & Plan Note (Addendum)
Assessment TTE in 01/2017 with LVEF 50-55% and grade 1 DD, more consistent with HFpEF. She does appear volume overloaded on exam and admits that she has not been taking lasix 20mg  every day as prescribed due to not feeling well. No crackles on exam and vitals otherwise stable. I suspect her volume overload is contributing somewhat to her not feeling well overall. Discussed with patient increasing lasix dose for 7 days, then returning to typical dose. Patient agreeable to this plan  Plan - Increase to lasix 40mg  daily x7d, then return to lasix 20mg  daily after - Continue carvedilol 6.25mg  BID and lisinopril 20mg  daily

## 2017-10-12 DIAGNOSIS — G609 Hereditary and idiopathic neuropathy, unspecified: Secondary | ICD-10-CM | POA: Diagnosis not present

## 2017-10-12 LAB — BMP8+ANION GAP
Anion Gap: 16 mmol/L (ref 10.0–18.0)
BUN/Creatinine Ratio: 16 (ref 9–23)
BUN: 16 mg/dL (ref 6–24)
CALCIUM: 9.3 mg/dL (ref 8.7–10.2)
CHLORIDE: 96 mmol/L (ref 96–106)
CO2: 24 mmol/L (ref 20–29)
CREATININE: 1.03 mg/dL — AB (ref 0.57–1.00)
GFR calc Af Amer: 69 mL/min/{1.73_m2} (ref >59)
GFR calc non Af Amer: 60 mL/min/{1.73_m2} (ref >59)
GLUCOSE: 330 mg/dL — AB (ref 65–99)
Potassium: 4.8 mmol/L (ref 3.5–5.2)
Sodium: 136 mmol/L (ref 134–144)

## 2017-10-12 LAB — CBC WITH DIFFERENTIAL/PLATELET
BASOS ABS: 0 10*3/uL (ref 0.0–0.2)
Basos: 0 %
EOS (ABSOLUTE): 0.2 10*3/uL (ref 0.0–0.4)
EOS: 2 %
HEMATOCRIT: 36 % (ref 34.0–46.6)
HEMOGLOBIN: 11.2 g/dL (ref 11.1–15.9)
IMMATURE GRANS (ABS): 0 10*3/uL (ref 0.0–0.1)
IMMATURE GRANULOCYTES: 0 %
LYMPHS: 35 %
Lymphocytes Absolute: 3.2 10*3/uL — ABNORMAL HIGH (ref 0.7–3.1)
MCH: 27.9 pg (ref 26.6–33.0)
MCHC: 31.1 g/dL — ABNORMAL LOW (ref 31.5–35.7)
MCV: 90 fL (ref 79–97)
Monocytes Absolute: 0.4 10*3/uL (ref 0.1–0.9)
Monocytes: 4 %
Neutrophils Absolute: 5.5 10*3/uL (ref 1.4–7.0)
Neutrophils: 59 %
Platelets: 283 10*3/uL (ref 150–379)
RBC: 4.02 x10E6/uL (ref 3.77–5.28)
RDW: 14.3 % (ref 12.3–15.4)
WBC: 9.3 10*3/uL (ref 3.4–10.8)

## 2017-10-13 DIAGNOSIS — G609 Hereditary and idiopathic neuropathy, unspecified: Secondary | ICD-10-CM | POA: Diagnosis not present

## 2017-10-14 DIAGNOSIS — G609 Hereditary and idiopathic neuropathy, unspecified: Secondary | ICD-10-CM | POA: Diagnosis not present

## 2017-10-15 DIAGNOSIS — G609 Hereditary and idiopathic neuropathy, unspecified: Secondary | ICD-10-CM | POA: Diagnosis not present

## 2017-10-16 DIAGNOSIS — G609 Hereditary and idiopathic neuropathy, unspecified: Secondary | ICD-10-CM | POA: Diagnosis not present

## 2017-10-17 DIAGNOSIS — G609 Hereditary and idiopathic neuropathy, unspecified: Secondary | ICD-10-CM | POA: Diagnosis not present

## 2017-10-18 DIAGNOSIS — G609 Hereditary and idiopathic neuropathy, unspecified: Secondary | ICD-10-CM | POA: Diagnosis not present

## 2017-10-19 DIAGNOSIS — G609 Hereditary and idiopathic neuropathy, unspecified: Secondary | ICD-10-CM | POA: Diagnosis not present

## 2017-10-20 DIAGNOSIS — G609 Hereditary and idiopathic neuropathy, unspecified: Secondary | ICD-10-CM | POA: Diagnosis not present

## 2017-10-20 DIAGNOSIS — L97511 Non-pressure chronic ulcer of other part of right foot limited to breakdown of skin: Secondary | ICD-10-CM | POA: Diagnosis not present

## 2017-10-21 ENCOUNTER — Other Ambulatory Visit: Payer: Self-pay | Admitting: Internal Medicine

## 2017-10-21 DIAGNOSIS — G609 Hereditary and idiopathic neuropathy, unspecified: Secondary | ICD-10-CM | POA: Diagnosis not present

## 2017-10-21 DIAGNOSIS — I10 Essential (primary) hypertension: Secondary | ICD-10-CM

## 2017-10-22 DIAGNOSIS — G609 Hereditary and idiopathic neuropathy, unspecified: Secondary | ICD-10-CM | POA: Diagnosis not present

## 2017-10-23 DIAGNOSIS — G609 Hereditary and idiopathic neuropathy, unspecified: Secondary | ICD-10-CM | POA: Diagnosis not present

## 2017-10-24 DIAGNOSIS — G609 Hereditary and idiopathic neuropathy, unspecified: Secondary | ICD-10-CM | POA: Diagnosis not present

## 2017-10-25 ENCOUNTER — Telehealth: Payer: Self-pay

## 2017-10-25 DIAGNOSIS — G609 Hereditary and idiopathic neuropathy, unspecified: Secondary | ICD-10-CM | POA: Diagnosis not present

## 2017-10-25 NOTE — Telephone Encounter (Signed)
Pt requesting refill on Oxycodone 5/325 mg not Oxy IR 5 mg.  Last OV 4/17 with dr Ronalee Red. Next OV 5/17. UDS 07/21/17.

## 2017-10-25 NOTE — Telephone Encounter (Signed)
oxyCODONE (OXY IR/ROXICODONE) 5 MG immediate release tablet   Refill request @ [facility] on [address]

## 2017-10-26 DIAGNOSIS — G609 Hereditary and idiopathic neuropathy, unspecified: Secondary | ICD-10-CM | POA: Diagnosis not present

## 2017-10-27 DIAGNOSIS — G609 Hereditary and idiopathic neuropathy, unspecified: Secondary | ICD-10-CM | POA: Diagnosis not present

## 2017-10-27 MED ORDER — OXYCODONE-ACETAMINOPHEN 5-325 MG PO TABS
1.0000 | ORAL_TABLET | Freq: Three times a day (TID) | ORAL | 0 refills | Status: DC | PRN
Start: 2017-10-27 — End: 2018-01-25

## 2017-10-27 NOTE — Telephone Encounter (Signed)
Pt was called / informed of refill. 

## 2017-10-27 NOTE — Telephone Encounter (Signed)
Rx sent now.

## 2017-10-27 NOTE — Telephone Encounter (Signed)
Pt would like to know if Oxycodone is ready. Please call pt back.

## 2017-10-28 DIAGNOSIS — G609 Hereditary and idiopathic neuropathy, unspecified: Secondary | ICD-10-CM | POA: Diagnosis not present

## 2017-10-29 DIAGNOSIS — G609 Hereditary and idiopathic neuropathy, unspecified: Secondary | ICD-10-CM | POA: Diagnosis not present

## 2017-10-30 DIAGNOSIS — G609 Hereditary and idiopathic neuropathy, unspecified: Secondary | ICD-10-CM | POA: Diagnosis not present

## 2017-10-31 DIAGNOSIS — G609 Hereditary and idiopathic neuropathy, unspecified: Secondary | ICD-10-CM | POA: Diagnosis not present

## 2017-11-01 ENCOUNTER — Other Ambulatory Visit: Payer: Self-pay | Admitting: Internal Medicine

## 2017-11-01 DIAGNOSIS — G609 Hereditary and idiopathic neuropathy, unspecified: Secondary | ICD-10-CM | POA: Diagnosis not present

## 2017-11-02 DIAGNOSIS — G609 Hereditary and idiopathic neuropathy, unspecified: Secondary | ICD-10-CM | POA: Diagnosis not present

## 2017-11-03 DIAGNOSIS — G609 Hereditary and idiopathic neuropathy, unspecified: Secondary | ICD-10-CM | POA: Diagnosis not present

## 2017-11-04 DIAGNOSIS — G609 Hereditary and idiopathic neuropathy, unspecified: Secondary | ICD-10-CM | POA: Diagnosis not present

## 2017-11-05 ENCOUNTER — Other Ambulatory Visit: Payer: Self-pay | Admitting: Internal Medicine

## 2017-11-05 DIAGNOSIS — G609 Hereditary and idiopathic neuropathy, unspecified: Secondary | ICD-10-CM | POA: Diagnosis not present

## 2017-11-06 DIAGNOSIS — G609 Hereditary and idiopathic neuropathy, unspecified: Secondary | ICD-10-CM | POA: Diagnosis not present

## 2017-11-07 DIAGNOSIS — G609 Hereditary and idiopathic neuropathy, unspecified: Secondary | ICD-10-CM | POA: Diagnosis not present

## 2017-11-08 DIAGNOSIS — G609 Hereditary and idiopathic neuropathy, unspecified: Secondary | ICD-10-CM | POA: Diagnosis not present

## 2017-11-09 DIAGNOSIS — G609 Hereditary and idiopathic neuropathy, unspecified: Secondary | ICD-10-CM | POA: Diagnosis not present

## 2017-11-10 ENCOUNTER — Encounter: Payer: Self-pay | Admitting: Internal Medicine

## 2017-11-10 ENCOUNTER — Other Ambulatory Visit: Payer: Self-pay | Admitting: Internal Medicine

## 2017-11-10 ENCOUNTER — Other Ambulatory Visit: Payer: Self-pay

## 2017-11-10 ENCOUNTER — Ambulatory Visit (INDEPENDENT_AMBULATORY_CARE_PROVIDER_SITE_OTHER): Payer: Medicare HMO | Admitting: Internal Medicine

## 2017-11-10 VITALS — BP 112/51 | HR 109 | Temp 97.8°F | Ht 66.0 in | Wt 262.5 lb

## 2017-11-10 DIAGNOSIS — Z794 Long term (current) use of insulin: Secondary | ICD-10-CM | POA: Diagnosis not present

## 2017-11-10 DIAGNOSIS — G609 Hereditary and idiopathic neuropathy, unspecified: Secondary | ICD-10-CM | POA: Diagnosis not present

## 2017-11-10 DIAGNOSIS — G8929 Other chronic pain: Secondary | ICD-10-CM | POA: Diagnosis not present

## 2017-11-10 DIAGNOSIS — E1169 Type 2 diabetes mellitus with other specified complication: Secondary | ICD-10-CM

## 2017-11-10 DIAGNOSIS — R05 Cough: Secondary | ICD-10-CM | POA: Diagnosis not present

## 2017-11-10 DIAGNOSIS — Z79891 Long term (current) use of opiate analgesic: Secondary | ICD-10-CM | POA: Diagnosis not present

## 2017-11-10 DIAGNOSIS — I2510 Atherosclerotic heart disease of native coronary artery without angina pectoris: Secondary | ICD-10-CM

## 2017-11-10 DIAGNOSIS — E1140 Type 2 diabetes mellitus with diabetic neuropathy, unspecified: Secondary | ICD-10-CM

## 2017-11-10 DIAGNOSIS — M7989 Other specified soft tissue disorders: Secondary | ICD-10-CM | POA: Diagnosis not present

## 2017-11-10 DIAGNOSIS — R0981 Nasal congestion: Secondary | ICD-10-CM

## 2017-11-10 DIAGNOSIS — M545 Low back pain: Secondary | ICD-10-CM | POA: Diagnosis not present

## 2017-11-10 DIAGNOSIS — I10 Essential (primary) hypertension: Secondary | ICD-10-CM

## 2017-11-10 DIAGNOSIS — IMO0002 Reserved for concepts with insufficient information to code with codable children: Secondary | ICD-10-CM

## 2017-11-10 DIAGNOSIS — M2041 Other hammer toe(s) (acquired), right foot: Secondary | ICD-10-CM | POA: Diagnosis not present

## 2017-11-10 DIAGNOSIS — R059 Cough, unspecified: Secondary | ICD-10-CM

## 2017-11-10 DIAGNOSIS — E1165 Type 2 diabetes mellitus with hyperglycemia: Secondary | ICD-10-CM | POA: Diagnosis not present

## 2017-11-10 LAB — GLUCOSE, CAPILLARY: GLUCOSE-CAPILLARY: 365 mg/dL — AB (ref 65–99)

## 2017-11-10 LAB — POCT GLYCOSYLATED HEMOGLOBIN (HGB A1C): HEMOGLOBIN A1C: 11.5

## 2017-11-10 MED ORDER — AZITHROMYCIN 250 MG PO TABS: 250.0000 mg | ORAL_TABLET | Freq: Every day | ORAL | 0 refills | Status: DC

## 2017-11-10 MED ORDER — FLUTICASONE PROPIONATE 50 MCG/ACT NA SUSP: 1.0000 | Freq: Every day | NASAL | 2 refills | Status: DC

## 2017-11-10 NOTE — Patient Instructions (Addendum)
I recommend starting an intranasal steroid for your ongoing cough and congestion problems. I have also reordered another course of azithromycin which may be helpful for relief of inflammation.  Getting your blood sugar under better control is important this is probably making a lot of your symptoms worse. The first step is to be taking your medicines EVERY time. Setting an alarm or making a checklist can be a helpful tool for this.  I have sent medication refills on your other medications to your pharmacy. Ideally we can see you back in 6-8 weeks to check on progress with your diabetes and potentially change medicines if it is still uncontrolled while taking every dose.

## 2017-11-10 NOTE — Progress Notes (Signed)
CC: Cough  HPI:  [name] is a 57 y.o. female with PMHx detailed below presenting for follow-up visit to address her cough, uncontrolled type 2 diabetes, and chronic low back pain. She is continued to have sinus congestion and cough.  She was previously seen and had a negative chest x-ray with prescription of azithromycin for presumed acute bronchitis.  This helped her symptoms partially but they have not resolved.  Her type 2 diabetes has been poorly controlled with symptoms of blurry vision and urinary frequency.  She reports missing doses of her medication at night due to forgetfulness or falling asleep before taking it.  She is not having any episodes of hypoglycemia  She continues to have chronic low back pain that is well controlled on her prescribed as needed Percocet.  See problem based assessment and plan below for additional details.  Uncontrolled type 2 diabetes with neuropathy (HCC) Diabetes control is worsening today with hemoglobin A1c of 11.5%.  Currently she is missing doses of her Lantus insulin with some regularity as the biggest contributor to this worsening control.  Her reported diet also sounds fairly poor.  She is symptomatic of hyperglycemia. Plan: Recommended continuing the current prescriptions but improving medication adherence Encourage intervention such as use of a reminder or alarm Continue Victoza 1.8 mg Continue Lantus insulin 44 units at night Continue metformin 1000 mg twice daily  Cough She continues to have persistent coughing even though there was improvement from her worsened symptoms after taking azithromycin for 5 days last month.  This is likely now a subacute postinfectious cough.  There is also some sinus congestion that could be contributory. Plan: Recommend adding an intranasal steroid while having symptoms of congestion  Long-term current use of opiate analgesic Marcene Brawn is on chronic opioid therapy for chronic pain. The  date of the controlled substances contract is referenced in the [place] and / or the overview. Date of pain contract was 07/28/2014. As part of the treatment plan, the [place] controlled substance database is checked at least twice yearly and the database results are appropriate. I have last reviewed the results on 11/10/17.   The last UDS was on 07/21/17 and results are as expected. Patient needs at least a yearly UDS.   The patient is on oxycodone/acetaminophen (Percocet, Tylox) strength 5-325mg , #90 per 30 days. Adjunctive treatment includes Elavil, NSAID's and Muscle relaxants. This regimen allows Marcene Brawn to function and does not cause excessive sedation or other side effects.  "The benefits of continuing opioid therapy outweigh the risks and chronic opioids will be continued. Ongoing education about safe opioid treatment is provided  Interventions today include: Electronic Rx sent x3 months    Past Medical History:  Diagnosis Date  . Adhesive capsulitis of left shoulder    Steroid injection Dr. Truman Hayward 1/12  . Adhesive capsulitis of right shoulder    Steroid injection Dr. Truman Hayward 1/12  . Adhesive capsulitis of shoulder    bilateral, Steroid injection Dr. Truman Hayward 1/12 bilaterally  . CAD (coronary artery disease)    nonobstructive. Last cardiac cath (2008) showing left circumflex with mid 50% stenosis and distal luminal irregularities. Also with RCA with mid to distal 30-40% stenosis. // Previously evaluated by [facility], never followed up outpatient.  Marland Kitchen CAP (community acquired pneumonia) 06/15/2012   05/2012 CXR: Mild opacification of the posterior lung base on the lateral film  as cannot exclude infection/atelectasis.    . CHF (congestive heart failure) ([place])   .  Diabetes mellitus 2007   Type II, insulin dependent  . Diabetic retinopathy   . GERD (gastroesophageal reflux disease)   . Glaucoma   . Hyperlipidemia   . Hypertension   . Obesity   . Peripheral neuropathy     Review of  Systems: Review of Systems  Constitutional: Positive for malaise/fatigue. Negative for chills and fever.  Respiratory: Positive for cough and sputum production. Negative for hemoptysis and wheezing.   Cardiovascular: Negative for chest pain.  Gastrointestinal: Negative for constipation and nausea.  Musculoskeletal: Positive for back pain. Negative for falls.  Endo/Heme/Allergies: Positive for environmental allergies.     Physical Exam: Vitals:   11/10/17 1320  BP: (!) 112/51  Pulse: (!) 109  Temp: 97.8 F (36.6 C)  TempSrc: Oral  SpO2: 100%  Weight: 262 lb 8 oz (119.1 kg)  Height: 5\' 6"  (1.676 m)   GENERAL- alert, co-operative, NAD HEENT- Atraumatic, PERRL, oral mucosa appears moist, no cervical LN enlargement. CARDIAC-mild tachycardia, regular rhythm with no murmurs or rubs RESP- CTAB, no wheezes or crackles. BACK- Normal curvature, mild bilateral paraspinal tenderness in lumbar spine NEURO- strength upper and lower extremities- 5/5, Sensation intact globally, normal symmetric patellar reflexes EXTREMITIES- symmetric, no pedal edema. SKIN- Warm, dry, No rash or lesion. PSYCH- Normal mood and affect, appropriate thought content and speech.   Assessment & Plan:   See encounters tab for problem based medical decision making.   Patient discussed with Dr. Angelia Mould

## 2017-11-11 DIAGNOSIS — G609 Hereditary and idiopathic neuropathy, unspecified: Secondary | ICD-10-CM | POA: Diagnosis not present

## 2017-11-12 DIAGNOSIS — G609 Hereditary and idiopathic neuropathy, unspecified: Secondary | ICD-10-CM | POA: Diagnosis not present

## 2017-11-13 DIAGNOSIS — G609 Hereditary and idiopathic neuropathy, unspecified: Secondary | ICD-10-CM | POA: Diagnosis not present

## 2017-11-14 DIAGNOSIS — G609 Hereditary and idiopathic neuropathy, unspecified: Secondary | ICD-10-CM | POA: Diagnosis not present

## 2017-11-15 DIAGNOSIS — G609 Hereditary and idiopathic neuropathy, unspecified: Secondary | ICD-10-CM | POA: Diagnosis not present

## 2017-11-16 DIAGNOSIS — G609 Hereditary and idiopathic neuropathy, unspecified: Secondary | ICD-10-CM | POA: Diagnosis not present

## 2017-11-16 MED ORDER — OXYCODONE-ACETAMINOPHEN 5-325 MG PO TABS: 1.0000 | ORAL_TABLET | Freq: Three times a day (TID) | ORAL | 0 refills | Status: DC | PRN

## 2017-11-16 NOTE — Assessment & Plan Note (Signed)
She continues to have persistent coughing even though there was improvement from her worsened symptoms after taking azithromycin for 5 days last month.  This is likely now a subacute postinfectious cough.  There is also some sinus congestion that could be contributory. Plan: Recommend adding an intranasal steroid while having symptoms of congestion

## 2017-11-16 NOTE — Assessment & Plan Note (Signed)
Diabetes control is worsening today with hemoglobin A1c of 11.5%.  Currently she is missing doses of her Lantus insulin with some regularity as the biggest contributor to this worsening control.  Her reported diet also sounds fairly poor.  She is symptomatic of hyperglycemia. Plan: Recommended continuing the current prescriptions but improving medication adherence Encourage intervention such as use of a reminder or alarm Continue Victoza 1.8 mg Continue Lantus insulin 44 units at night Continue metformin 1000 mg twice daily

## 2017-11-16 NOTE — Assessment & Plan Note (Addendum)
Marcene Brawn is on chronic opioid therapy for chronic pain. The date of the controlled substances contract is referenced in the [place] and / or the overview. Date of pain contract was 07/28/2014. As part of the treatment plan, the [place] controlled substance database is checked at least twice yearly and the database results are appropriate. I have last reviewed the results on 11/10/17.   The last UDS was on 07/21/17 and results are as expected. Patient needs at least a yearly UDS.   The patient is on oxycodone/acetaminophen (Percocet, Tylox) strength 5-325mg , #90 per 30 days. Adjunctive treatment includes Elavil, NSAID's and Muscle relaxants. This regimen allows Marcene Brawn to function and does not cause excessive sedation or other side effects.  "The benefits of continuing opioid therapy outweigh the risks and chronic opioids will be continued. Ongoing education about safe opioid treatment is provided  Interventions today include: Electronic Rx sent x3 months

## 2017-11-17 DIAGNOSIS — G609 Hereditary and idiopathic neuropathy, unspecified: Secondary | ICD-10-CM | POA: Diagnosis not present

## 2017-11-17 NOTE — Progress Notes (Signed)
Internal Medicine Clinic Attending  Case discussed with Dr. Rice at the time of the visit.  We reviewed the resident's history and exam and pertinent patient test results.  I agree with the assessment, diagnosis, and plan of care documented in the resident's note.  

## 2017-11-18 DIAGNOSIS — G609 Hereditary and idiopathic neuropathy, unspecified: Secondary | ICD-10-CM | POA: Diagnosis not present

## 2017-11-19 DIAGNOSIS — G609 Hereditary and idiopathic neuropathy, unspecified: Secondary | ICD-10-CM | POA: Diagnosis not present

## 2017-11-20 DIAGNOSIS — G609 Hereditary and idiopathic neuropathy, unspecified: Secondary | ICD-10-CM | POA: Diagnosis not present

## 2017-11-21 DIAGNOSIS — G609 Hereditary and idiopathic neuropathy, unspecified: Secondary | ICD-10-CM | POA: Diagnosis not present

## 2017-11-22 ENCOUNTER — Other Ambulatory Visit: Payer: Self-pay | Admitting: *Deleted

## 2017-11-22 DIAGNOSIS — IMO0002 Reserved for concepts with insufficient information to code with codable children: Secondary | ICD-10-CM

## 2017-11-22 DIAGNOSIS — G609 Hereditary and idiopathic neuropathy, unspecified: Secondary | ICD-10-CM | POA: Diagnosis not present

## 2017-11-22 DIAGNOSIS — E1165 Type 2 diabetes mellitus with hyperglycemia: Secondary | ICD-10-CM

## 2017-11-22 DIAGNOSIS — E1140 Type 2 diabetes mellitus with diabetic neuropathy, unspecified: Secondary | ICD-10-CM

## 2017-11-22 MED ORDER — GLUCOSE BLOOD VI STRP: ORAL_STRIP | 1 refills | Status: DC

## 2017-11-23 DIAGNOSIS — G609 Hereditary and idiopathic neuropathy, unspecified: Secondary | ICD-10-CM | POA: Diagnosis not present

## 2017-11-24 ENCOUNTER — Other Ambulatory Visit: Payer: Self-pay | Admitting: Internal Medicine

## 2017-11-24 DIAGNOSIS — G609 Hereditary and idiopathic neuropathy, unspecified: Secondary | ICD-10-CM | POA: Diagnosis not present

## 2017-11-24 NOTE — Telephone Encounter (Signed)
Pain scripts at pharm, next not due until 6/2 Strips are ready for pick up Confirmed w/ pharm Attempted to call pt, one ph not in service, the other not accepting calls at this time

## 2017-11-24 NOTE — Telephone Encounter (Signed)
Patient is requesting refill on test strips and pain medicine

## 2017-11-25 DIAGNOSIS — G609 Hereditary and idiopathic neuropathy, unspecified: Secondary | ICD-10-CM | POA: Diagnosis not present

## 2017-11-26 DIAGNOSIS — G609 Hereditary and idiopathic neuropathy, unspecified: Secondary | ICD-10-CM | POA: Diagnosis not present

## 2017-11-27 ENCOUNTER — Other Ambulatory Visit: Payer: Self-pay | Admitting: Internal Medicine

## 2017-11-27 DIAGNOSIS — G609 Hereditary and idiopathic neuropathy, unspecified: Secondary | ICD-10-CM | POA: Diagnosis not present

## 2017-11-27 NOTE — Telephone Encounter (Signed)
Called pharm, scripts on file, ask them to fill and notify pt, they are agreeable

## 2017-11-27 NOTE — Telephone Encounter (Signed)
Patient requesting refill on pain medicine, pls send to [facility]

## 2017-11-28 ENCOUNTER — Other Ambulatory Visit: Payer: Self-pay | Admitting: Internal Medicine

## 2017-11-28 DIAGNOSIS — G609 Hereditary and idiopathic neuropathy, unspecified: Secondary | ICD-10-CM | POA: Diagnosis not present

## 2017-11-29 DIAGNOSIS — G609 Hereditary and idiopathic neuropathy, unspecified: Secondary | ICD-10-CM | POA: Diagnosis not present

## 2017-11-30 DIAGNOSIS — G609 Hereditary and idiopathic neuropathy, unspecified: Secondary | ICD-10-CM | POA: Diagnosis not present

## 2017-12-01 DIAGNOSIS — G609 Hereditary and idiopathic neuropathy, unspecified: Secondary | ICD-10-CM | POA: Diagnosis not present

## 2017-12-02 DIAGNOSIS — G609 Hereditary and idiopathic neuropathy, unspecified: Secondary | ICD-10-CM | POA: Diagnosis not present

## 2017-12-03 DIAGNOSIS — G609 Hereditary and idiopathic neuropathy, unspecified: Secondary | ICD-10-CM | POA: Diagnosis not present

## 2017-12-04 DIAGNOSIS — G609 Hereditary and idiopathic neuropathy, unspecified: Secondary | ICD-10-CM | POA: Diagnosis not present

## 2017-12-05 DIAGNOSIS — G609 Hereditary and idiopathic neuropathy, unspecified: Secondary | ICD-10-CM | POA: Diagnosis not present

## 2017-12-06 DIAGNOSIS — G609 Hereditary and idiopathic neuropathy, unspecified: Secondary | ICD-10-CM | POA: Diagnosis not present

## 2017-12-07 DIAGNOSIS — G609 Hereditary and idiopathic neuropathy, unspecified: Secondary | ICD-10-CM | POA: Diagnosis not present

## 2017-12-08 ENCOUNTER — Encounter: Payer: Self-pay | Admitting: Internal Medicine

## 2017-12-08 ENCOUNTER — Ambulatory Visit: Payer: Medicare HMO

## 2017-12-08 DIAGNOSIS — G609 Hereditary and idiopathic neuropathy, unspecified: Secondary | ICD-10-CM | POA: Diagnosis not present

## 2017-12-09 ENCOUNTER — Emergency Department (HOSPITAL_COMMUNITY): Payer: Medicare HMO

## 2017-12-09 ENCOUNTER — Emergency Department (HOSPITAL_COMMUNITY)
Admission: EM | Admit: 2017-12-09 | Discharge: 2017-12-09 | Disposition: A | Discharge status: Home / Self Care | Payer: Medicare HMO | Attending: Emergency Medicine | Admitting: Emergency Medicine

## 2017-12-09 ENCOUNTER — Encounter (HOSPITAL_COMMUNITY): Payer: Self-pay

## 2017-12-09 DIAGNOSIS — R05 Cough: Secondary | ICD-10-CM | POA: Diagnosis not present

## 2017-12-09 DIAGNOSIS — Z87891 Personal history of nicotine dependence: Secondary | ICD-10-CM | POA: Diagnosis not present

## 2017-12-09 DIAGNOSIS — I110 Hypertensive heart disease with heart failure: Secondary | ICD-10-CM | POA: Diagnosis not present

## 2017-12-09 DIAGNOSIS — J069 Acute upper respiratory infection, unspecified: Secondary | ICD-10-CM | POA: Diagnosis not present

## 2017-12-09 DIAGNOSIS — Z79899 Other long term (current) drug therapy: Secondary | ICD-10-CM | POA: Insufficient documentation

## 2017-12-09 DIAGNOSIS — R509 Fever, unspecified: Secondary | ICD-10-CM | POA: Diagnosis not present

## 2017-12-09 DIAGNOSIS — I5020 Unspecified systolic (congestive) heart failure: Secondary | ICD-10-CM | POA: Insufficient documentation

## 2017-12-09 DIAGNOSIS — Z794 Long term (current) use of insulin: Secondary | ICD-10-CM | POA: Insufficient documentation

## 2017-12-09 DIAGNOSIS — G609 Hereditary and idiopathic neuropathy, unspecified: Secondary | ICD-10-CM | POA: Diagnosis not present

## 2017-12-09 DIAGNOSIS — I2510 Atherosclerotic heart disease of native coronary artery without angina pectoris: Secondary | ICD-10-CM | POA: Insufficient documentation

## 2017-12-09 DIAGNOSIS — R0981 Nasal congestion: Secondary | ICD-10-CM | POA: Diagnosis not present

## 2017-12-09 DIAGNOSIS — Z7902 Long term (current) use of antithrombotics/antiplatelets: Secondary | ICD-10-CM | POA: Diagnosis not present

## 2017-12-09 DIAGNOSIS — Z7982 Long term (current) use of aspirin: Secondary | ICD-10-CM | POA: Insufficient documentation

## 2017-12-09 DIAGNOSIS — R059 Cough, unspecified: Secondary | ICD-10-CM

## 2017-12-09 MED ORDER — DOXYCYCLINE HYCLATE 100 MG PO CAPS: 100.0000 mg | ORAL_CAPSULE | Freq: Two times a day (BID) | ORAL | 0 refills | Status: DC

## 2017-12-09 MED ORDER — ALBUTEROL SULFATE (2.5 MG/3ML) 0.083% IN NEBU
5.0000 mg | INHALATION_SOLUTION | Freq: Once | RESPIRATORY_TRACT | Status: AC
  Administered 2017-12-09: 5 mg via RESPIRATORY_TRACT
  Filled 2017-12-09: qty 6

## 2017-12-09 MED ORDER — BENZONATATE 100 MG PO CAPS: 100.0000 mg | ORAL_CAPSULE | Freq: Three times a day (TID) | ORAL | 0 refills | Status: DC | PRN

## 2017-12-09 MED ORDER — KETOROLAC TROMETHAMINE 30 MG/ML IJ SOLN: 30.0000 mg | Freq: Once | INTRAMUSCULAR | Status: DC

## 2017-12-09 MED ORDER — IBUPROFEN 600 MG PO TABS: 600.0000 mg | ORAL_TABLET | Freq: Three times a day (TID) | ORAL | 0 refills | Status: DC | PRN

## 2017-12-09 MED ORDER — KETOROLAC TROMETHAMINE 30 MG/ML IJ SOLN
30.0000 mg | Freq: Once | INTRAMUSCULAR | Status: AC
  Administered 2017-12-09: 30 mg via INTRAMUSCULAR
  Filled 2017-12-09: qty 1

## 2017-12-09 NOTE — ED Triage Notes (Signed)
Onset 4 days non productive cough, nasal congestion.  Pt c/o soreness ribs, chest, and back.  Pt talking in complete sentences.

## 2017-12-09 NOTE — ED Provider Notes (Signed)
[facility] EMERGENCY DEPARTMENT Provider Note   CSN: [id number] Arrival date & time: 12/09/17  1554     History   Chief Complaint Chief Complaint  Patient presents with  . Cough    HPI [name] is a 57 y.o. female.  The history is provided by the patient and medical records. No language interpreter was used.  Cough  Associated symptoms include chills. Pertinent negatives include no chest pain and no shortness of breath.   [name] is a 57 y.o. female  with a PMH of HTN, HLD, DM2, CHF, CAD who presents to the Emergency Department complaining of persistent nonproductive cough, nasal congestion, subjective fevers for the last week.  Patient reports that she can hear rattling congestion in her chest, but cannot cough it up.  She has been trying Mucinex with no improvement.  She also endorses soreness to bilateral aspects of back, ribs and chest wall which she attributes to coughing.  She reports history of similar about 2 months ago where she was treated with antibiotics.  She feels as if it might not have gone away completely as the cough continues to flare back up.  She had another flare last month, but this improved without treatment.  She feels as of the last week is the most severe her upper respiratory symptoms have been in the last 2 to 3 months.   Past Medical History:  Diagnosis Date  . Adhesive capsulitis of left shoulder    Steroid injection Dr. Truman Hayward 1/12  . Adhesive capsulitis of right shoulder    Steroid injection Dr. Truman Hayward 1/12  . Adhesive capsulitis of shoulder    bilateral, Steroid injection Dr. Truman Hayward 1/12 bilaterally  . CAD (coronary artery disease)    nonobstructive. Last cardiac cath (2008) showing left circumflex with mid 50% stenosis and distal luminal irregularities. Also with RCA with mid to distal 30-40% stenosis. // Previously evaluated by [facility], never followed up outpatient.  Marland Kitchen CAP (community acquired pneumonia)  06/15/2012   05/2012 CXR: Mild opacification of the posterior lung base on the lateral film  as cannot exclude infection/atelectasis.    . CHF (congestive heart failure) ([place])   . Diabetes mellitus 2007   Type II, insulin dependent  . Diabetic retinopathy   . GERD (gastroesophageal reflux disease)   . Glaucoma   . Hyperlipidemia   . Hypertension   . Obesity   . Peripheral neuropathy     Patient Active Problem List   Diagnosis Date Noted  . Cough 08/23/2017  . Rib pain on right side 06/28/2017  . Lateral pain of right hip 04/27/2017  . Right foot ulcer ([place]) 10/26/2016  . Constipation 09/30/2016  . History of colonic polyps 09/30/2016  . Right foot pain 07/08/2016  . Non-alcoholic fatty liver disease 05/13/2016  . Spinal stenosis in cervical region 09/29/2015  . Bilateral low back pain with sciatica   . Long-term current use of opiate analgesic 07/11/2014  . Onychomycosis 10/29/2013  . Severe nonproliferative diabetic retinopathy ([place]) 04/04/2013  . Primary open angle glaucoma 04/04/2013  . Breast cancer screening 12/05/2012  . Pain and swelling of left knee 10/29/2012  . Insomnia 06/15/2012  . Systolic CHF ([place]) 71/69/6789  . CAD (coronary artery disease) 11/02/2011  . Preventative health care 07/13/2011  . Adhesive capsulitis of right shoulder 11/16/2010  . Gastroesophageal reflux disease 10/12/2009  . Uncontrolled type 2 diabetes with neuropathy ([place]) 06/12/2006  . Hyperlipidemia 06/12/2006  . OBESITY 06/12/2006  .  Essential hypertension 06/12/2006    Past Surgical History:  Procedure Laterality Date  . ANTERIOR CERVICAL DECOMP/DISCECTOMY FUSION N/A 06/16/2016   Procedure: Anterior Cervical Decompression/discectomy Fusion - Cervical six - Cervical seven;  Surgeon: Eustace Moore, MD;  Location: [facility];  Service: Neurosurgery;  Laterality: N/A;  Anterior Cervical Decompression/discectomy Fusion - Cervical six - Cervical seven  . APPENDECTOMY    . CARDIAC  CATHETERIZATION    . CATARACT EXTRACTION    . GLAUCOMA SURGERY    . LEFT HEART CATHETERIZATION WITH CORONARY ANGIOGRAM N/A 01/24/2012   Procedure: LEFT HEART CATHETERIZATION WITH CORONARY ANGIOGRAM;  Surgeon: Sherren Mocha, MD;  Location: [facility] CATH LAB;  Service: Cardiovascular;  Laterality: N/A;  . POSTERIOR CERVICAL FUSION/FORAMINOTOMY N/A 10/22/2015   Procedure: Cervical three-Cervical Seven Posterior cervical fusion with lateral mass fixation,  Laminectomy Cervical Three-Cervical Seven;  Surgeon: Eustace Moore, MD;  Location: [facility] NEURO ORS;  Service: Neurosurgery;  Laterality: N/A;  Posterior  . TUBAL LIGATION       OB History   None      Home Medications    Prior to Admission medications   Medication Sig Start Date End Date Taking? Authorizing Provider  amitriptyline (ELAVIL) 25 MG tablet TAKE 1 TABLET BY MOUTH AT BEDTIME 11/30/17   Rice, Resa Miner, MD  aspirin EC 81 MG EC tablet Take 1 tablet (81 mg total) by mouth daily. 05/01/15   Strader, Fransisco Hertz, PA-C  azithromycin (ZITHROMAX) 250 MG tablet Take 1 tablet (250 mg total) by mouth daily. Take 2 tablets on day 1 11/10/17   Rice, Resa Miner, MD  benzonatate (TESSALON) 100 MG capsule Take 1 capsule (100 mg total) by mouth 3 (three) times daily as needed for cough. 12/09/17   Maevis Mumby, Ozella Almond, PA-C  carvedilol (COREG) 6.25 MG tablet TAKE 1 TABLET BY MOUTH TWICE DAILY 11/10/17   Rice, Resa Miner, MD  cyclobenzaprine (FLEXERIL) 10 MG tablet TAKE 1 TABLET BY MOUTH TWICE DAILY AS NEEDED FOR MUSCLE SPASMS 09/11/17   Bartholomew Crews, MD  diclofenac sodium (VOLTAREN) 1 % GEL Apply 2 g topically 3 (three) times daily as needed. 07/21/17   Colbert Ewing, MD  dorzolamide-timolol (COSOPT) 22.3-6.8 MG/ML ophthalmic solution Place 1 drop into both eyes 2 (two) times daily.     [provider]  doxycycline (VIBRAMYCIN) 100 MG capsule Take 1 capsule (100 mg total) by mouth 2 (two) times daily. 12/09/17   Hendy Brindle, Ozella Almond, PA-C    fluticasone (FLONASE) 50 MCG/ACT nasal spray Place 1 spray into both nostrils daily. 11/10/17 11/10/18  Collier Salina, MD  furosemide (LASIX) 20 MG tablet TAKE 1 TABLET(20 MG) BY MOUTH DAILY 10/23/17   Rice, Resa Miner, MD  gabapentin (NEURONTIN) 300 MG capsule TAKE 1 CAPSULE BY MOUTH THREE TIMES DAILY 11/10/17   Rice, Resa Miner, MD  glucose blood (ONETOUCH VERIO) test strip USE TO CHECK BLOOD SUGAR UP TO THREE TIMES DAILY AS DIRECTED 11/22/17   Rice, Resa Miner, MD  ibuprofen (ADVIL,MOTRIN) 600 MG tablet Take 1 tablet (600 mg total) by mouth every 8 (eight) hours as needed. 12/09/17   Adrain Nesbit, Ozella Almond, PA-C  Insulin Glargine (LANTUS SOLOSTAR) 100 UNIT/ML Solostar Pen Inject 44 Units into the skin at bedtime. 09/27/17   Rice, Resa Miner, MD  Insulin Pen Needle (B-D UF III MINI PEN NEEDLES) 31G X 5 MM MISC Use to inject insulin 4-5 times daily 07/22/16   Collier Salina, MD  IRON PO Take 1 tablet by mouth 3 (  three) times daily.    [provider]  latanoprost (XALATAN) 0.005 % ophthalmic solution Place 1 drop into both eyes at bedtime. Reported on 10/29/2015 05/12/15   [provider]  liraglutide 18 MG/3ML SOPN Inject 0.2 mLs (1.2 mg total) into the skin daily. 02/22/17   Rice, Resa Miner, MD  lisinopril (PRINIVIL,ZESTRIL) 20 MG tablet Take 1 tablet (20 mg total) by mouth daily. 09/11/17   Bartholomew Crews, MD  metFORMIN (GLUCOPHAGE) 1000 MG tablet TAKE 1 TABLET TWICE DAILY WITH A MEAL 11/06/17   Rice, Resa Miner, MD  naproxen (NAPROSYN) 500 MG tablet  04/17/17   [provider]  NOVOLOG FLEXPEN 100 UNIT/ML FlexPen INJECT 12 UNITS UNDER THE SKIN THREE TIMES DAILY WITH MEALS 10/11/17   Rice, Resa Miner, MD  [facility] DELICA LANCETS FINE MISC Check blood sugar as instructed up to 3 times a day 03/14/16   Collier Salina, MD  oxyCODONE-acetaminophen (PERCOCET/ROXICET) 5-325 MG tablet Take 1 tablet by mouth 3 (three) times daily as needed for severe  pain. 10/27/17   Rice, Resa Miner, MD  oxyCODONE-acetaminophen (PERCOCET/ROXICET) 5-325 MG tablet Take 1 tablet by mouth every 8 (eight) hours as needed for severe pain. 11/26/17   Rice, Resa Miner, MD  oxyCODONE-acetaminophen (PERCOCET/ROXICET) 5-325 MG tablet Take 1 tablet by mouth every 8 (eight) hours as needed for severe pain. 12/26/17   Collier Salina, MD  pantoprazole (PROTONIX) 40 MG tablet TAKE 1 TABLET BY MOUTH DAILY 08/15/17   Rice, Resa Miner, MD  polyethylene glycol (MIRALAX / GLYCOLAX) packet Take 17 g by mouth daily. 09/23/16   Domenic Moras, PA-C  pravastatin (PRAVACHOL) 20 MG tablet TAKE 1 TABLET BY MOUTH EVERY EVENING 11/10/17   Rice, Resa Miner, MD  senna (SENOKOT) 8.6 MG TABS tablet Take 2 tablets (17.2 mg total) by mouth daily. 09/30/16   Collier Salina, MD    Family History Family History  Problem Relation Age of Onset  . Hypertension Sister   . Hypertension Sister   . Hypertension Sister   . Heart attack Neg Hx   . Stroke Neg Hx     Social History Social History   Tobacco Use  . Smoking status: Former Smoker    Packs/day: 0.30    Years: 7.00    Pack years: 2.10    Types: Cigarettes    Last attempt to quit: 06/27/2006    Years since quitting: 11.4  . Smokeless tobacco: Never Used  Substance Use Topics  . Alcohol use: No    Alcohol/week: 0.0 oz  . Drug use: No     Allergies   Patient has no known allergies.   Review of Systems Review of Systems  Constitutional: Positive for chills and fever (Subjective).  HENT: Positive for congestion.   Respiratory: Positive for cough. Negative for shortness of breath.   Cardiovascular: Negative for chest pain.  All other systems reviewed and are negative.    Physical Exam Updated Vital Signs BP (!) 152/87   Pulse (!) 113   Temp 98.7 F (37.1 C) (Oral)   Resp 18   LMP 02/13/2011   SpO2 95%   Physical Exam  Constitutional: She is oriented to person, place, and time. She appears well-developed  and well-nourished. No distress.  HENT:  Head: Normocephalic and atraumatic.  Cardiovascular: Normal rate, regular rhythm and normal heart sounds.  No murmur heard. Pulmonary/Chest: Effort normal and breath sounds normal. No respiratory distress. She has no wheezes. She has no rales. She exhibits  tenderness.  Abdominal: Soft. She exhibits no distension. There is no tenderness.  Musculoskeletal: She exhibits no edema.  Neurological: She is alert and oriented to person, place, and time.  Skin: Skin is warm and dry.  Nursing note and vitals reviewed.    ED Treatments / Results  Labs (all labs ordered are listed, but only abnormal results are displayed) Labs Reviewed - No data to display  EKG None  Radiology Dg Chest 2 View  Result Date: 12/09/2017 CLINICAL DATA:  Pt states SOB, cough, fever and left sided CP x 1 week. States she took antibiotics and "it came back." Hx HTN, DM, CHF, CAD. Former smoker. EXAM: CHEST - 2 VIEW COMPARISON:  10/11/2017 FINDINGS: Midline trachea.  Normal heart size and mediastinal contours. Sharp costophrenic angles.  No pneumothorax.  Clear lungs. Mid to lower thoracic spondylosis.  Lower cervical spine fixation. IMPRESSION: No active cardiopulmonary disease. Electronically Signed   By: Abigail Miyamoto M.D.   On: 12/09/2017 16:45    Procedures Procedures (including critical care time)  Medications Ordered in ED Medications  albuterol (PROVENTIL) (2.5 MG/3ML) 0.083% nebulizer solution 5 mg (5 mg Nebulization Given 12/09/17 1606)  ketorolac (TORADOL) 30 MG/ML injection 30 mg (30 mg Intramuscular Given 12/09/17 1719)     Initial Impression / Assessment and Plan / ED Course  I have reviewed the triage vital signs and the nursing notes.  Pertinent labs & imaging results that were available during my care of the patient were reviewed by me and considered in my medical decision making (see chart for details).    [name] is a 57 y.o. female who presents  to ED for cough, congestion, subjective fevers x 1 week. Followed by PCP and has had flare-ups of similar symptoms monthly. She was treated with azithro on 4/17 and notes some improvement, but does not feel as if infection completely cleared. Now reports acutely worsening nasal congestion, rattling congestion in chest, dry cough and subjective fevers. Afebrile in ED today.  Staff reported expiratory wheezing on initial triage evaluation.  Patient has received neb treatment prior to my evaluation and does have clear lung sounds.  Tachycardia noted. PE considered. No other risk factors for PE. Low risk Wells at 1.5. Constellation of URI symptoms - uri much more likely. She has PCP and will follow up with them this week for recheck. We spoke at length about reasons to return to ER. All questions answered.    Final Clinical Impressions(s) / ED Diagnoses   Final diagnoses:  Cough  Upper respiratory tract infection, unspecified type    ED Discharge Orders        Ordered    doxycycline (VIBRAMYCIN) 100 MG capsule  2 times daily     12/09/17 1717    benzonatate (TESSALON) 100 MG capsule  3 times daily PRN     12/09/17 1717    ibuprofen (ADVIL,MOTRIN) 600 MG tablet  Every 8 hours PRN     12/09/17 1717       Alisha Bacus, Ozella Almond, PA-C 12/09/17 1733    Hayden Rasmussen, MD 12/11/17 1130

## 2017-12-09 NOTE — Discharge Instructions (Signed)
It was my pleasure taking care of you today!   Please take all of your antibiotics until finished!  Tessalon as needed for cough. Ibuprofen as needed for pain.   Please call your primary care clinic first thing Monday morning to schedule a follow up appointment.   Return to ER for difficulty breathing, new or worsening symptoms, any additional concerns.

## 2017-12-10 ENCOUNTER — Other Ambulatory Visit: Payer: Self-pay | Admitting: Internal Medicine

## 2017-12-10 DIAGNOSIS — IMO0002 Reserved for concepts with insufficient information to code with codable children: Secondary | ICD-10-CM

## 2017-12-10 DIAGNOSIS — M542 Cervicalgia: Secondary | ICD-10-CM

## 2017-12-10 DIAGNOSIS — E1140 Type 2 diabetes mellitus with diabetic neuropathy, unspecified: Secondary | ICD-10-CM

## 2017-12-10 DIAGNOSIS — E1165 Type 2 diabetes mellitus with hyperglycemia: Secondary | ICD-10-CM

## 2017-12-10 DIAGNOSIS — G609 Hereditary and idiopathic neuropathy, unspecified: Secondary | ICD-10-CM | POA: Diagnosis not present

## 2017-12-11 DIAGNOSIS — G609 Hereditary and idiopathic neuropathy, unspecified: Secondary | ICD-10-CM | POA: Diagnosis not present

## 2017-12-11 NOTE — Telephone Encounter (Signed)
I think based on notes she is supposed to be on 1.8 of the Victoza.  Can you please call and confirm her dosage ?  Thanks

## 2017-12-12 DIAGNOSIS — G609 Hereditary and idiopathic neuropathy, unspecified: Secondary | ICD-10-CM | POA: Diagnosis not present

## 2017-12-13 DIAGNOSIS — G609 Hereditary and idiopathic neuropathy, unspecified: Secondary | ICD-10-CM | POA: Diagnosis not present

## 2017-12-14 DIAGNOSIS — G609 Hereditary and idiopathic neuropathy, unspecified: Secondary | ICD-10-CM | POA: Diagnosis not present

## 2017-12-15 DIAGNOSIS — G609 Hereditary and idiopathic neuropathy, unspecified: Secondary | ICD-10-CM | POA: Diagnosis not present

## 2017-12-16 DIAGNOSIS — G609 Hereditary and idiopathic neuropathy, unspecified: Secondary | ICD-10-CM | POA: Diagnosis not present

## 2017-12-17 DIAGNOSIS — G609 Hereditary and idiopathic neuropathy, unspecified: Secondary | ICD-10-CM | POA: Diagnosis not present

## 2017-12-18 DIAGNOSIS — G609 Hereditary and idiopathic neuropathy, unspecified: Secondary | ICD-10-CM | POA: Diagnosis not present

## 2017-12-19 DIAGNOSIS — G609 Hereditary and idiopathic neuropathy, unspecified: Secondary | ICD-10-CM | POA: Diagnosis not present

## 2017-12-20 DIAGNOSIS — G609 Hereditary and idiopathic neuropathy, unspecified: Secondary | ICD-10-CM | POA: Diagnosis not present

## 2017-12-21 DIAGNOSIS — G609 Hereditary and idiopathic neuropathy, unspecified: Secondary | ICD-10-CM | POA: Diagnosis not present

## 2017-12-22 ENCOUNTER — Ambulatory Visit (INDEPENDENT_AMBULATORY_CARE_PROVIDER_SITE_OTHER): Payer: Medicare HMO | Admitting: Internal Medicine

## 2017-12-22 ENCOUNTER — Encounter: Payer: Self-pay | Admitting: Internal Medicine

## 2017-12-22 VITALS — BP 153/74 | HR 106 | Temp 97.8°F | Ht 66.0 in | Wt 268.5 lb

## 2017-12-22 DIAGNOSIS — IMO0002 Reserved for concepts with insufficient information to code with codable children: Secondary | ICD-10-CM

## 2017-12-22 DIAGNOSIS — Z794 Long term (current) use of insulin: Secondary | ICD-10-CM

## 2017-12-22 DIAGNOSIS — E118 Type 2 diabetes mellitus with unspecified complications: Secondary | ICD-10-CM

## 2017-12-22 DIAGNOSIS — G609 Hereditary and idiopathic neuropathy, unspecified: Secondary | ICD-10-CM | POA: Diagnosis not present

## 2017-12-22 DIAGNOSIS — E1140 Type 2 diabetes mellitus with diabetic neuropathy, unspecified: Secondary | ICD-10-CM | POA: Diagnosis not present

## 2017-12-22 DIAGNOSIS — E1165 Type 2 diabetes mellitus with hyperglycemia: Secondary | ICD-10-CM

## 2017-12-22 LAB — GLUCOSE, CAPILLARY: Glucose-Capillary: 149 mg/dL — ABNORMAL HIGH (ref 70–99)

## 2017-12-22 MED ORDER — INSULIN GLARGINE 100 UNIT/ML SOLOSTAR PEN: 35.0000 [IU] | PEN_INJECTOR | Freq: Every day | SUBCUTANEOUS | 5 refills | Status: DC

## 2017-12-22 NOTE — Patient Instructions (Addendum)
[name],  Please decrease the dose of your Lantus from 44 units to 35 units.  I also want to switch the timing of your Lantus injection to 35 units in the morning.  Its important that you both take your insulin and other medications consistently every day as well as check your blood sugars consistently at least 2-3 times a day.  Schedule appointment with Dr. Maudie Mercury or [name] to have a continuous glucose monitor placed.  Please schedule follow-up with your new primary care physician on August 8th.

## 2017-12-22 NOTE — Progress Notes (Signed)
   CC: DM follow up  HPI:  [name] is a 57 y.o. female with a past medical history listed below here today for follow up of her uncontrolled DM type 2.   For details of today's visit and the status of her chronic medical issues please refer to the assessment and plan.   Past Medical History:  Diagnosis Date  . Adhesive capsulitis of left shoulder    Steroid injection Dr. Truman Hayward 1/12  . Adhesive capsulitis of right shoulder    Steroid injection Dr. Truman Hayward 1/12  . Adhesive capsulitis of shoulder    bilateral, Steroid injection Dr. Truman Hayward 1/12 bilaterally  . CAD (coronary artery disease)    nonobstructive. Last cardiac cath (2008) showing left circumflex with mid 50% stenosis and distal luminal irregularities. Also with RCA with mid to distal 30-40% stenosis. // Previously evaluated by [facility], never followed up outpatient.  Marland Kitchen CAP (community acquired pneumonia) 06/15/2012   05/2012 CXR: Mild opacification of the posterior lung base on the lateral film  as cannot exclude infection/atelectasis.    . CHF (congestive heart failure) ([place])   . Diabetes mellitus 2007   Type II, insulin dependent  . Diabetic retinopathy   . GERD (gastroesophageal reflux disease)   . Glaucoma   . Hyperlipidemia   . Hypertension   . Obesity   . Peripheral neuropathy    Review of Systems:   No chest pain or shortness of breath  Physical Exam:  Vitals:   12/22/17 0934  BP: (!) 153/74  Pulse: (!) 106  Temp: 97.8 F (36.6 C)  TempSrc: Oral  SpO2: 98%  Weight: 268 lb 8 oz (121.8 kg)  Height: 5\' 6"  (1.676 m)   GENERAL- alert, co-operative, appears as stated age, not in any distress. HEENT- Atraumatic, normocephalic CARDIAC- RRR, no murmurs, rubs or gallops. RESP-Clear to auscultation bilaterally, no wheezes or crackles. SKIN- Warm, dry, No rash or lesion. PSYCH- Normal mood and affect, appropriate thought content and speech.   Assessment & Plan:   See Encounters Tab for problem  based charting.  Patient discussed with Dr. Angelia Mould

## 2017-12-22 NOTE — Assessment & Plan Note (Addendum)
Lab Results  Component Value Date   HGBA1C 11.5 11/10/2017   HGBA1C 10.1 04/27/2017   HGBA1C 9.1 01/06/2017    She was seen in clinic on 11/04/2017 for follow-up on her diabetes.  At that visit she complains of polyuria and vision changes.  She reported that she misses doses of her nightly medications due to forgetfulness.  Her A1c has been uncontrolled for some time and has been worsening with most recent check at previous visit of 11.5%.  She is currently prescribed Victoza 1.8 mg daily, Lantus 44 units nightly, metformin 1000 mg twice daily.  She presents today for follow-up of her diabetes.  She does bring her meter today with her which shows 13 readings over the past 30 days.  Glucose average over that time was 138 with a high of 287 and a low of 66.  She has several fasting readings in the 70s and 80s along with her low of 66.  She has only one reading since June 5.  Plan: I will decrease her Lantus from 44 units nightly to 35 units and instruct her to take it in the morning.  We will continue her metformin and Victoza as previously.  We will work on getting her set up for a continuous glucose monitor so we can further assess her hypoglycemic episodes.  I also encouraged her to check her CBGs more consistently.

## 2017-12-23 DIAGNOSIS — G609 Hereditary and idiopathic neuropathy, unspecified: Secondary | ICD-10-CM | POA: Diagnosis not present

## 2017-12-24 DIAGNOSIS — G609 Hereditary and idiopathic neuropathy, unspecified: Secondary | ICD-10-CM | POA: Diagnosis not present

## 2017-12-25 ENCOUNTER — Telehealth: Payer: Self-pay

## 2017-12-25 ENCOUNTER — Encounter: Payer: Self-pay | Admitting: Dietician

## 2017-12-25 ENCOUNTER — Encounter: Payer: Self-pay | Admitting: Internal Medicine

## 2017-12-25 ENCOUNTER — Ambulatory Visit (INDEPENDENT_AMBULATORY_CARE_PROVIDER_SITE_OTHER): Payer: Medicare HMO | Admitting: Dietician

## 2017-12-25 DIAGNOSIS — E1140 Type 2 diabetes mellitus with diabetic neuropathy, unspecified: Secondary | ICD-10-CM | POA: Diagnosis not present

## 2017-12-25 DIAGNOSIS — Z794 Long term (current) use of insulin: Secondary | ICD-10-CM | POA: Diagnosis not present

## 2017-12-25 DIAGNOSIS — E1165 Type 2 diabetes mellitus with hyperglycemia: Secondary | ICD-10-CM

## 2017-12-25 DIAGNOSIS — IMO0002 Reserved for concepts with insufficient information to code with codable children: Secondary | ICD-10-CM

## 2017-12-25 DIAGNOSIS — G609 Hereditary and idiopathic neuropathy, unspecified: Secondary | ICD-10-CM | POA: Diagnosis not present

## 2017-12-25 NOTE — Progress Notes (Signed)
Internal Medicine Clinic Attending  Case discussed with Dr. Boswell at the time of the visit.  We reviewed the resident's history and exam and pertinent patient test results.  I agree with the assessment, diagnosis, and plan of care documented in the resident's note.  

## 2017-12-25 NOTE — Telephone Encounter (Signed)
3 Rxs oxycodone sent 11/16/2017; last to begin 12/26/2017. Confirmed with Pharmacy that they have this Rx. Patient notified she will need to contact pharmacist tomorrow and request this to be filled. Hubbard Hartshorn, RN, BSN

## 2017-12-25 NOTE — Patient Instructions (Signed)
Please check blood sugar 4 times a day.  Bring your meter to our next appointment.  Write down your medicine IF you take a different amount than  35 units lantus and  12 units Novolog BEFORE meals

## 2017-12-25 NOTE — Progress Notes (Addendum)
Documentation: Freestyle Libre Pro CGM sensor placed and started. Patient was educated about wearing sensor, keeping food, activity and medication log and when to call office. She was also educated to not have an MRI or CT scan while waring the sensor. She verbalized understanding.  She describes signs and symptoms of hypoglycemia at times. Her nmost recent meter downlad showed blood sugars below 70 mg/dl.  She has been taking her mealtime insulin after meals and taking 40 units of lantus instead of 35 units even though she verbalized that she was supposed to be taking 35 units. She checks her blood sugar 4 times a day. She does not have her meter with her today. She was advised to continue checking her blood sugar as instructed. She would like to have a [employer] personal to self monitor her blood sugars more closely.  Patient was also assisted with accessing and becoming familiar with her Mychart account on her phone.   Follow up was arranged with the patient.  Debera Lat, RD 12/25/2017 1:29 PM.

## 2017-12-25 NOTE — Telephone Encounter (Signed)
oxyCODONE-acetaminophen (PERCOCET/ROXICET) 5-325 MG tablet, refill request @ [facility] on [address].

## 2017-12-26 ENCOUNTER — Other Ambulatory Visit: Payer: Self-pay | Admitting: Dietician

## 2017-12-26 DIAGNOSIS — IMO0002 Reserved for concepts with insufficient information to code with codable children: Secondary | ICD-10-CM

## 2017-12-26 DIAGNOSIS — G609 Hereditary and idiopathic neuropathy, unspecified: Secondary | ICD-10-CM | POA: Diagnosis not present

## 2017-12-26 DIAGNOSIS — E1165 Type 2 diabetes mellitus with hyperglycemia: Secondary | ICD-10-CM

## 2017-12-26 DIAGNOSIS — E1140 Type 2 diabetes mellitus with diabetic neuropathy, unspecified: Secondary | ICD-10-CM

## 2017-12-26 MED ORDER — FREESTYLE LIBRE 14 DAY SENSOR MISC: 1.0000 | Freq: Four times a day (QID) | 12 refills | Status: DC

## 2017-12-26 MED ORDER — FREESTYLE LIBRE 14 DAY READER DEVI: 1.0000 | Freq: Four times a day (QID) | 0 refills | Status: DC

## 2017-12-26 NOTE — Progress Notes (Signed)
Request Continuous glucose monitor to assist patient with diabetes control and prevention of hypoglycemia.

## 2017-12-27 DIAGNOSIS — G609 Hereditary and idiopathic neuropathy, unspecified: Secondary | ICD-10-CM | POA: Diagnosis not present

## 2017-12-28 ENCOUNTER — Other Ambulatory Visit: Payer: Self-pay | Admitting: Oncology

## 2017-12-28 ENCOUNTER — Other Ambulatory Visit: Payer: Self-pay | Admitting: Internal Medicine

## 2017-12-28 DIAGNOSIS — M542 Cervicalgia: Secondary | ICD-10-CM

## 2017-12-28 DIAGNOSIS — G609 Hereditary and idiopathic neuropathy, unspecified: Secondary | ICD-10-CM | POA: Diagnosis not present

## 2017-12-28 DIAGNOSIS — R0781 Pleurodynia: Secondary | ICD-10-CM

## 2017-12-29 DIAGNOSIS — G609 Hereditary and idiopathic neuropathy, unspecified: Secondary | ICD-10-CM | POA: Diagnosis not present

## 2017-12-29 MED ORDER — DICLOFENAC SODIUM 1 % TD GEL: 2.0000 g | Freq: Three times a day (TID) | TRANSDERMAL | 0 refills | Status: DC | PRN

## 2017-12-30 DIAGNOSIS — G609 Hereditary and idiopathic neuropathy, unspecified: Secondary | ICD-10-CM | POA: Diagnosis not present

## 2017-12-31 DIAGNOSIS — G609 Hereditary and idiopathic neuropathy, unspecified: Secondary | ICD-10-CM | POA: Diagnosis not present

## 2018-01-01 ENCOUNTER — Encounter: Payer: Medicare HMO | Admitting: Dietician

## 2018-01-01 ENCOUNTER — Ambulatory Visit: Payer: Medicare HMO

## 2018-01-01 DIAGNOSIS — G609 Hereditary and idiopathic neuropathy, unspecified: Secondary | ICD-10-CM | POA: Diagnosis not present

## 2018-01-02 DIAGNOSIS — G609 Hereditary and idiopathic neuropathy, unspecified: Secondary | ICD-10-CM | POA: Diagnosis not present

## 2018-01-03 ENCOUNTER — Encounter: Payer: Self-pay | Admitting: Dietician

## 2018-01-03 ENCOUNTER — Ambulatory Visit (INDEPENDENT_AMBULATORY_CARE_PROVIDER_SITE_OTHER): Payer: Medicare HMO | Admitting: Internal Medicine

## 2018-01-03 ENCOUNTER — Encounter: Payer: Self-pay | Admitting: Internal Medicine

## 2018-01-03 ENCOUNTER — Ambulatory Visit: Payer: Medicare HMO | Admitting: Dietician

## 2018-01-03 VITALS — BP 108/82 | HR 88 | Temp 97.9°F

## 2018-01-03 DIAGNOSIS — Z794 Long term (current) use of insulin: Secondary | ICD-10-CM | POA: Diagnosis not present

## 2018-01-03 DIAGNOSIS — E1140 Type 2 diabetes mellitus with diabetic neuropathy, unspecified: Secondary | ICD-10-CM | POA: Diagnosis not present

## 2018-01-03 DIAGNOSIS — E1165 Type 2 diabetes mellitus with hyperglycemia: Secondary | ICD-10-CM

## 2018-01-03 DIAGNOSIS — E118 Type 2 diabetes mellitus with unspecified complications: Secondary | ICD-10-CM

## 2018-01-03 DIAGNOSIS — Z87891 Personal history of nicotine dependence: Secondary | ICD-10-CM

## 2018-01-03 DIAGNOSIS — IMO0002 Reserved for concepts with insufficient information to code with codable children: Secondary | ICD-10-CM

## 2018-01-03 DIAGNOSIS — G609 Hereditary and idiopathic neuropathy, unspecified: Secondary | ICD-10-CM | POA: Diagnosis not present

## 2018-01-03 LAB — GLUCOSE, CAPILLARY: Glucose-Capillary: 133 mg/dL — ABNORMAL HIGH (ref 70–99)

## 2018-01-03 NOTE — Progress Notes (Signed)
CC: T2DM follow up, CGM check  HPI:  [name] is a 57 y.o. female with PMH below.  She is here to address her uncontrolled T2DM.  Please see A&P for status of the patient's chronic medical conditions  Past Medical History:  Diagnosis Date  . Adhesive capsulitis of left shoulder    Steroid injection Dr. Truman Hayward 1/12  . Adhesive capsulitis of right shoulder    Steroid injection Dr. Truman Hayward 1/12  . Adhesive capsulitis of shoulder    bilateral, Steroid injection Dr. Truman Hayward 1/12 bilaterally  . CAD (coronary artery disease)    nonobstructive. Last cardiac cath (2008) showing left circumflex with mid 50% stenosis and distal luminal irregularities. Also with RCA with mid to distal 30-40% stenosis. // Previously evaluated by [name], never followed up outpatient.  Marland Kitchen CAP (community acquired pneumonia) 06/15/2012   05/2012 CXR: Mild opacification of the posterior lung base on the lateral film  as cannot exclude infection/atelectasis.    . CHF (congestive heart failure) ([place])   . Diabetes mellitus 2007   Type II, insulin dependent  . Diabetic retinopathy   . GERD (gastroesophageal reflux disease)   . Glaucoma   . Hyperlipidemia   . Hypertension   . Obesity   . Peripheral neuropathy    Review of Systems:  ROS: Pulmonary: pt denies increased work of breathing, shortness of breath,  Cardiac: pt denies palpitations, chest pain,  Abdominal: pt denies abdominal pain, nausea, vomiting, or diarrhea  Physical Exam:  Vitals:   01/03/18 0935  BP: 108/82  Pulse: 88  Temp: 97.9 F (36.6 C)  TempSrc: Oral  SpO2: 99%   Physical Exam  Constitutional: No distress.  Neck: No JVD present.  Cardiovascular: Normal rate, regular rhythm and normal heart sounds. Exam reveals no gallop and no friction rub.  No murmur heard. Pulmonary/Chest: Effort normal and breath sounds normal. No respiratory distress. She has no wheezes. She has no rales. She exhibits no tenderness.    Musculoskeletal: She exhibits edema (1+ LE edema bilaterally).  Neurological: She is alert.  Skin: She is not diaphoretic.    Social History   Socioeconomic History  . Marital status: Single    Spouse name: Not on file  . Number of children: 2  . Years of education: 12th grade  . Highest education level: Not on file  Occupational History  . Occupation: Development worker, community: UNEMPLOYED    Comment: used to work in [employer] fast [employer] at [employer]). Got disability in 2007.   Social Needs  . Financial resource strain: Not on file  . Food insecurity:    Worry: Not on file    Inability: Not on file  . Transportation needs:    Medical: Not on file    Non-medical: Not on file  Tobacco Use  . Smoking status: Former Smoker    Packs/day: 0.30    Years: 7.00    Pack years: 2.10    Types: Cigarettes    Last attempt to quit: 06/27/2006    Years since quitting: 11.5  . Smokeless tobacco: Never Used  Substance and Sexual Activity  . Alcohol use: No    Alcohol/week: 0.0 oz  . Drug use: No  . Sexual activity: Not Currently  Lifestyle  . Physical activity:    Days per week: Not on file    Minutes per session: Not on file  . Stress: Not on file  Relationships  . Social connections:    Talks on  phone: Not on file    Gets together: Not on file    Attends religious service: Not on file    Active member of club or organization: Not on file    Attends meetings of clubs or organizations: Not on file    Relationship status: Not on file  . Intimate partner violence:    Fear of current or ex partner: Not on file    Emotionally abused: Not on file    Physically abused: Not on file    Forced sexual activity: Not on file  Other Topics Concern  . Not on file  Social History Narrative   Lives with her kids, 24yo daughter and 59 yo adopted son.    Family History  Problem Relation Age of Onset  . Hypertension Sister   . Hypertension Sister   . Hypertension Sister   .  Heart attack Neg Hx   . Stroke Neg Hx     Assessment & Plan:   See Encounters Tab for problem based charting.  Patient discussed with Dr. Dareen Piano

## 2018-01-03 NOTE — Assessment & Plan Note (Addendum)
Pt reports taking lantus 35 units qam and she has been taking novolog around 6 units with breakfast and dinner and 12 units at lunch.  Her blood sugar readings are very random.  Trying to piece together her data from 4 days of cgm(fell off) and her sporadic checking on her meter is difficult.  No one told her to take 6 units for breakfast and dinner, she was doing this on her own.  I'm afraid her increased lunch dose drops her blood sugar which causes her to eat heavily for the dinner meal, only take 6 units with it and her blood sugar spikes to over 300 after dinner.  She reports a very poor diet still and admits to falling asleep often and forgetting to take her insulin.    -will simplify regimen today 8 units novolog TID and 35 units lantus daily, continue victoza 1.8mg  and metformin 1000mg  BID -replace CGM pt will have permanent cgm placed -encouraged more cbg readings especially fasting readings, asked that when she skips doses of insulin due to falling asleep to please write that down.  -will follow up in one week

## 2018-01-03 NOTE — Patient Instructions (Addendum)
Lantus- 35 units in the evening Humalog- 8 units three times daily  BEFORE Meals  Please make an appointment in 1 week. Bring the food and medication records with you.

## 2018-01-03 NOTE — Patient Instructions (Addendum)
[name], today we will switch your novolog insulin to 8 units three times daily.  You can continue taking your lantus at 35 units daily.  We will place another cgm today and have you come back for interpretation.  We need some more data to adjust your insulin better.  Please start taking your blood sugar when you first wake up before you have breakfast.  Continue to work on your diet and try and exercise more frequently.

## 2018-01-03 NOTE — Progress Notes (Signed)
Documentation: Her sensor fell off after 3 days. Applied new sensor today.  Freestyle Libre Pro CGM sensor placed and started. Patient was educated about wearing sensor, keeping food, activity and medication log and when to call office. She verbalized that her mealtime insulin was decreased to 8 units before meals.  She was also educated to not have an MRI or CT scan while waring the sensor. She verbalized understanding.   [name] is interested in a personal Continuous glucose monitoring. This would be easier for her to use with her low vision.  She checks her blood sugar 4 times a day. She was advised to continue checking her blood sugar as instructed. Called [facility] to assist patient with obtaining her personal freestyle libre  Patient is accessing and using her Mychart account on her phone.   Follow up was arranged with the patient.  [name], RD 01/03/2018 2:28 PM.

## 2018-01-04 DIAGNOSIS — G609 Hereditary and idiopathic neuropathy, unspecified: Secondary | ICD-10-CM | POA: Diagnosis not present

## 2018-01-04 NOTE — Progress Notes (Signed)
Internal Medicine Clinic Attending  Case discussed with Dr. Winfrey  at the time of the visit.  We reviewed the resident's history and exam and pertinent patient test results.  I agree with the assessment, diagnosis, and plan of care documented in the resident's note.  

## 2018-01-05 DIAGNOSIS — G609 Hereditary and idiopathic neuropathy, unspecified: Secondary | ICD-10-CM | POA: Diagnosis not present

## 2018-01-06 DIAGNOSIS — G609 Hereditary and idiopathic neuropathy, unspecified: Secondary | ICD-10-CM | POA: Diagnosis not present

## 2018-01-07 DIAGNOSIS — G609 Hereditary and idiopathic neuropathy, unspecified: Secondary | ICD-10-CM | POA: Diagnosis not present

## 2018-01-08 DIAGNOSIS — G609 Hereditary and idiopathic neuropathy, unspecified: Secondary | ICD-10-CM | POA: Diagnosis not present

## 2018-01-09 DIAGNOSIS — G609 Hereditary and idiopathic neuropathy, unspecified: Secondary | ICD-10-CM | POA: Diagnosis not present

## 2018-01-10 ENCOUNTER — Ambulatory Visit: Payer: Medicare HMO | Admitting: Dietician

## 2018-01-10 ENCOUNTER — Ambulatory Visit (INDEPENDENT_AMBULATORY_CARE_PROVIDER_SITE_OTHER): Payer: Medicare HMO | Admitting: Internal Medicine

## 2018-01-10 ENCOUNTER — Other Ambulatory Visit: Payer: Self-pay

## 2018-01-10 ENCOUNTER — Other Ambulatory Visit: Payer: Self-pay | Admitting: Oncology

## 2018-01-10 ENCOUNTER — Encounter: Payer: Self-pay | Admitting: Internal Medicine

## 2018-01-10 ENCOUNTER — Other Ambulatory Visit: Payer: Self-pay | Admitting: Internal Medicine

## 2018-01-10 ENCOUNTER — Encounter: Payer: Self-pay | Admitting: Dietician

## 2018-01-10 ENCOUNTER — Encounter: Payer: Self-pay | Admitting: Cardiovascular Disease

## 2018-01-10 DIAGNOSIS — E1140 Type 2 diabetes mellitus with diabetic neuropathy, unspecified: Secondary | ICD-10-CM

## 2018-01-10 DIAGNOSIS — IMO0002 Reserved for concepts with insufficient information to code with codable children: Secondary | ICD-10-CM

## 2018-01-10 DIAGNOSIS — Z794 Long term (current) use of insulin: Secondary | ICD-10-CM | POA: Diagnosis not present

## 2018-01-10 DIAGNOSIS — G609 Hereditary and idiopathic neuropathy, unspecified: Secondary | ICD-10-CM | POA: Diagnosis not present

## 2018-01-10 DIAGNOSIS — E1165 Type 2 diabetes mellitus with hyperglycemia: Secondary | ICD-10-CM

## 2018-01-10 DIAGNOSIS — R059 Cough, unspecified: Secondary | ICD-10-CM

## 2018-01-10 DIAGNOSIS — R05 Cough: Secondary | ICD-10-CM

## 2018-01-10 MED ORDER — AMITRIPTYLINE HCL 25 MG PO TABS: 25.0000 mg | ORAL_TABLET | Freq: Every day | ORAL | 1 refills | Status: DC

## 2018-01-10 MED ORDER — GABAPENTIN 300 MG PO CAPS: 300.0000 mg | ORAL_CAPSULE | Freq: Three times a day (TID) | ORAL | 1 refills | Status: DC

## 2018-01-10 NOTE — Telephone Encounter (Signed)
Have sent this for refill to md, rec'd 2 requests

## 2018-01-10 NOTE — Assessment & Plan Note (Signed)
CGM results:  [name] wore the CGM for 8 days. The average reading was 193, % time in target was 37, % time below target was 9, and % time above target was 54. Intervention will be to Lantus 35 units qAM, Novolog 12units w/ meals, Victoza 1.8mg , metformin 1000mg  BID. The patient will be scheduled to see in 1 week at [facility] for a final appointment.   - Pt wearing FreeStyle Libre Pro for 1 week with data listed above - Was previously on Lantus 35 units qAM, Novolog 8 units w/ meals, Victoza 1.8mg , metformin 1000mg  BID - Pt states she was taking Lantus at night time instead of morning due to force of habit - CGM glucometer graph show most of the time above range is in the afternoon and evening and lows in the morning  - Informed pt that she can avoid lows if she takes Lantus in the morning as instructed as she will be eating throughout the day - Also encouraged pt to avoid eating cake in the afternoon as she is above target mostly in the afternoon and evenings - Instructed pt to increase mealtime insulin from 8 to 12 (35 basal vs 36 mealtime for ~70 units daily [~0.6units/kg]) - Consider switching basal to Tresidba (insulin degludec) in a future visit (pt states she is 'tired of changing her diabetic meds so often in the last month')

## 2018-01-10 NOTE — Progress Notes (Signed)
   CC: Management of Type 2 Diabetes Mellitus  HPI: [name] is a 57 y.o. F w/ PMH of T2DM, CAD, CHF presenting to the clinic for follow up of continuous glucose monitor (placed 12/25/17). She mentions that she has been trying to take her insulin as prescribed but she continues to take her Lantus at night time instead of morning due to habit. She express discouragement from her most recent CGM readings as she thought she had been doing better with management of her diabetes. She states she is trying to follow a diabetic diet but she does eat cake a little too often. She states she takes her mealtime insulin as prescribed except when she skips breakfast in the morning and she continues to take metformin and victoza as prescribed. She describes signs and symptoms of hypoglycemia twice during the last week.  Past Medical History:  Diagnosis Date  . Adhesive capsulitis of left shoulder    Steroid injection Dr. Truman Hayward 1/12  . Adhesive capsulitis of right shoulder    Steroid injection Dr. Truman Hayward 1/12  . Adhesive capsulitis of shoulder    bilateral, Steroid injection Dr. Truman Hayward 1/12 bilaterally  . CAD (coronary artery disease)    nonobstructive. Last cardiac cath (2008) showing left circumflex with mid 50% stenosis and distal luminal irregularities. Also with RCA with mid to distal 30-40% stenosis. // Previously evaluated by [facility], never followed up outpatient.  Marland Kitchen CAP (community acquired pneumonia) 06/15/2012   05/2012 CXR: Mild opacification of the posterior lung base on the lateral film  as cannot exclude infection/atelectasis.    . CHF (congestive heart failure) ([place])   . Diabetes mellitus 2007   Type II, insulin dependent  . Diabetic retinopathy   . GERD (gastroesophageal reflux disease)   . Glaucoma   . Hyperlipidemia   . Hypertension   . Obesity   . Peripheral neuropathy     Review of Systems: Review of Systems  Constitutional: Negative for chills, fever and  malaise/fatigue.  Gastrointestinal: Negative for abdominal pain, constipation, diarrhea, nausea and vomiting.  Neurological: Negative for dizziness, tingling, sensory change, weakness and headaches.     Physical Exam: Vitals:   01/10/18 1005  BP: (!) 111/50  Pulse: 95  Temp: 98.1 F (36.7 C)  TempSrc: Oral  SpO2: 100%  Weight: 263 lb 9.6 oz (119.6 kg)  Height: 5\' 6"  (1.676 m)    Physical Exam  Constitutional: She is oriented to person, place, and time. She appears well-developed and well-nourished. No distress.  Cardiovascular: Normal rate, regular rhythm, normal heart sounds and intact distal pulses.  Respiratory: Effort normal and breath sounds normal. No respiratory distress.  Neurological: She is alert and oriented to person, place, and time. She has normal reflexes. No cranial nerve deficit.  Fine touch sensation intact bilateral lower extremities      Assessment & Plan:   See Encounters Tab for problem based charting.  Patient seen with Dr. Evette Doffing   -Gilberto Better, PGY1

## 2018-01-10 NOTE — Progress Notes (Signed)
Vitamin

## 2018-01-10 NOTE — Progress Notes (Addendum)
Documentation: Saw ms [name] briefly for CGM download #1 and ambulatory glucose profile markup. Her sensor was on but tegaderm was loose. Nurse reapplied more tegaderm today. She brought records with a few meals recorded.   Discussed Freestyle Libre Pro CGM  upload with [name] and Dr. Truman [name] and interpreting factors that affects glucose variability. [name] was to follow up with me after she saw the doctor, but elected not to wait.  [name] is interested in a personal Continuous glucose monitoring.  Called [facility] to assist patient with obtaining her personal freestyle libre. Will follow up about a personal CGM next week.   Note Victoza supposed to be 1.8, but prescription written for 1.2  Follow up was arranged with the patient.  [name], RD 01/10/2018 2:04 PM.

## 2018-01-10 NOTE — Patient Instructions (Signed)
[name]  Thank you so much for coming in to the clinic. We took a look at your CGM and it looks like your sugars are still running slightly high. We recommend that you increase your Novolog dose to 12 units each meal. If you feel that your sugars are dropping too low and you have symptoms of hypoglycemia, please revert back to your previous dose. Thank you for visiting the clinic.

## 2018-01-11 DIAGNOSIS — G609 Hereditary and idiopathic neuropathy, unspecified: Secondary | ICD-10-CM | POA: Diagnosis not present

## 2018-01-11 NOTE — Progress Notes (Signed)
Internal Medicine Clinic Attending  Patient seen with Dr. Truman Hayward at the time of the visit.  We reviewed the resident's history and exam and pertinent patient test results. I personally reviewed the CGM data & the resident's interpretation. I agree with the assessment, diagnosis, and plan of care documented in the resident's note.

## 2018-01-12 DIAGNOSIS — G609 Hereditary and idiopathic neuropathy, unspecified: Secondary | ICD-10-CM | POA: Diagnosis not present

## 2018-01-13 DIAGNOSIS — G609 Hereditary and idiopathic neuropathy, unspecified: Secondary | ICD-10-CM | POA: Diagnosis not present

## 2018-01-14 DIAGNOSIS — G609 Hereditary and idiopathic neuropathy, unspecified: Secondary | ICD-10-CM | POA: Diagnosis not present

## 2018-01-15 DIAGNOSIS — G609 Hereditary and idiopathic neuropathy, unspecified: Secondary | ICD-10-CM | POA: Diagnosis not present

## 2018-01-16 DIAGNOSIS — G609 Hereditary and idiopathic neuropathy, unspecified: Secondary | ICD-10-CM | POA: Diagnosis not present

## 2018-01-17 ENCOUNTER — Telehealth: Payer: Self-pay | Admitting: Dietician

## 2018-01-17 ENCOUNTER — Ambulatory Visit: Payer: Medicare HMO

## 2018-01-17 ENCOUNTER — Encounter: Payer: Self-pay | Admitting: *Deleted

## 2018-01-17 ENCOUNTER — Encounter: Payer: Self-pay | Admitting: Internal Medicine

## 2018-01-17 ENCOUNTER — Encounter: Payer: Medicare HMO | Admitting: Dietician

## 2018-01-17 DIAGNOSIS — G609 Hereditary and idiopathic neuropathy, unspecified: Secondary | ICD-10-CM | POA: Diagnosis not present

## 2018-01-17 NOTE — Telephone Encounter (Signed)
Left a message for patient about Continuous glucose monitoring follow up appointments today.

## 2018-01-17 NOTE — Telephone Encounter (Signed)
Called [facility] about order for [name]- they are out of network. [name] declined their offer for the cash price. Order faxed to [name].

## 2018-01-18 DIAGNOSIS — G609 Hereditary and idiopathic neuropathy, unspecified: Secondary | ICD-10-CM | POA: Diagnosis not present

## 2018-01-19 DIAGNOSIS — G609 Hereditary and idiopathic neuropathy, unspecified: Secondary | ICD-10-CM | POA: Diagnosis not present

## 2018-01-20 DIAGNOSIS — G609 Hereditary and idiopathic neuropathy, unspecified: Secondary | ICD-10-CM | POA: Diagnosis not present

## 2018-01-21 DIAGNOSIS — G609 Hereditary and idiopathic neuropathy, unspecified: Secondary | ICD-10-CM | POA: Diagnosis not present

## 2018-01-22 DIAGNOSIS — G609 Hereditary and idiopathic neuropathy, unspecified: Secondary | ICD-10-CM | POA: Diagnosis not present

## 2018-01-23 ENCOUNTER — Encounter: Payer: Self-pay | Admitting: Internal Medicine

## 2018-01-23 ENCOUNTER — Ambulatory Visit: Payer: Medicare HMO | Admitting: Dietician

## 2018-01-23 ENCOUNTER — Ambulatory Visit (INDEPENDENT_AMBULATORY_CARE_PROVIDER_SITE_OTHER): Payer: Medicare HMO | Admitting: Internal Medicine

## 2018-01-23 ENCOUNTER — Other Ambulatory Visit: Payer: Self-pay

## 2018-01-23 VITALS — BP 122/44 | HR 106 | Temp 98.3°F | Ht 66.0 in | Wt 266.5 lb

## 2018-01-23 DIAGNOSIS — G609 Hereditary and idiopathic neuropathy, unspecified: Secondary | ICD-10-CM | POA: Diagnosis not present

## 2018-01-23 DIAGNOSIS — E1140 Type 2 diabetes mellitus with diabetic neuropathy, unspecified: Secondary | ICD-10-CM

## 2018-01-23 DIAGNOSIS — Z79899 Other long term (current) drug therapy: Secondary | ICD-10-CM

## 2018-01-23 DIAGNOSIS — M25561 Pain in right knee: Secondary | ICD-10-CM | POA: Diagnosis not present

## 2018-01-23 DIAGNOSIS — G8929 Other chronic pain: Secondary | ICD-10-CM | POA: Insufficient documentation

## 2018-01-23 DIAGNOSIS — Z9889 Other specified postprocedural states: Secondary | ICD-10-CM | POA: Diagnosis not present

## 2018-01-23 DIAGNOSIS — E1165 Type 2 diabetes mellitus with hyperglycemia: Secondary | ICD-10-CM

## 2018-01-23 DIAGNOSIS — I509 Heart failure, unspecified: Secondary | ICD-10-CM

## 2018-01-23 DIAGNOSIS — IMO0002 Reserved for concepts with insufficient information to code with codable children: Secondary | ICD-10-CM

## 2018-01-23 DIAGNOSIS — Z791 Long term (current) use of non-steroidal anti-inflammatories (NSAID): Secondary | ICD-10-CM

## 2018-01-23 DIAGNOSIS — I2510 Atherosclerotic heart disease of native coronary artery without angina pectoris: Secondary | ICD-10-CM

## 2018-01-23 DIAGNOSIS — Z794 Long term (current) use of insulin: Secondary | ICD-10-CM

## 2018-01-23 DIAGNOSIS — E11319 Type 2 diabetes mellitus with unspecified diabetic retinopathy without macular edema: Secondary | ICD-10-CM | POA: Diagnosis not present

## 2018-01-23 DIAGNOSIS — I110 Hypertensive heart disease with heart failure: Secondary | ICD-10-CM

## 2018-01-23 MED ORDER — INSULIN DEGLUDEC 200 UNIT/ML ~~LOC~~ SOPN: 30.0000 [IU] | PEN_INJECTOR | Freq: Every day | SUBCUTANEOUS | 1 refills | Status: DC

## 2018-01-23 MED ORDER — DICLOFENAC SODIUM 1 % TD GEL: 2.0000 g | Freq: Three times a day (TID) | TRANSDERMAL | 0 refills | Status: DC | PRN

## 2018-01-23 MED ORDER — IBUPROFEN 600 MG PO TABS: 600.0000 mg | ORAL_TABLET | Freq: Three times a day (TID) | ORAL | 0 refills | Status: AC | PRN

## 2018-01-23 NOTE — Progress Notes (Signed)
   CC: CGM f/u  HPI:   [name] is a 57 y.o. woman with a medical history of CAD, CHF, insulin-dependent DMII, HLD, HTN, diabetic retinopathy, and diabetic neuropathy who presents for diabetes f/u after 2 weeks of CGM.   Past Medical History:  Diagnosis Date  . Adhesive capsulitis of left shoulder    Steroid injection Dr. Truman Hayward 1/12  . Adhesive capsulitis of right shoulder    Steroid injection Dr. Truman Hayward 1/12  . Adhesive capsulitis of shoulder    bilateral, Steroid injection Dr. Truman Hayward 1/12 bilaterally  . CAD (coronary artery disease)    nonobstructive. Last cardiac cath (2008) showing left circumflex with mid 50% stenosis and distal luminal irregularities. Also with RCA with mid to distal 30-40% stenosis. // Previously evaluated by [facility], never followed up outpatient.  Marland Kitchen CAP (community acquired pneumonia) 06/15/2012   05/2012 CXR: Mild opacification of the posterior lung base on the lateral film  as cannot exclude infection/atelectasis.    . CHF (congestive heart failure) ([place])   . Diabetes mellitus 2007   Type II, insulin dependent  . Diabetic retinopathy   . GERD (gastroesophageal reflux disease)   . Glaucoma   . Hyperlipidemia   . Hypertension   . Obesity   . Peripheral neuropathy     Review of Systems:   Constitutional: Negative for chills and fever.  HENT: Negative for rhinorrhea and sore throat.  Respiratory: Negative for cough and shortness of breath.   Cardiovascular: Negative for chest pain and palpitations.  Gastrointestinal: Negative for abdominal pain, nausea and vomiting. Skin: Negative for itching and rash.  MSK: Right knee has mild tenderness to palpation diffusely around the joint. No erythema or edema. Normal ROM.   Physical Exam:  Vitals:   01/23/18 1525  BP: (!) 122/44  Pulse: (!) 106  Temp: 98.3 F (36.8 C)  TempSrc: Oral  SpO2: 99%  Weight: 266 lb 8 oz (120.9 kg)  Height: 5\' 6"  (1.676 m)   Physical Exam  Constitutional:  Well-developed, well-nourished, and in no distress.  Eyes: Pupils are equal, round, and reactive to light. EOM are normal.  Cardiovascular: Normal rate and regular rhythm. No murmurs, rubs, or gallops. Pulmonary/Chest: Effort normal. Clear to auscultation bilaterally. No wheezes, rales, or rhonchi. Abdominal: Bowel sounds present. Soft, non-distended, non-tender. Ext: Trace extremity edema. Skin: Warm and dry. No rashes or wounds.   Assessment & Plan:   See Encounters Tab for problem based charting.  Patient seen with Dr. Rebeca Alert

## 2018-01-23 NOTE — Assessment & Plan Note (Addendum)
HPI: Pt reports pain and swelling in her right knee that has been going on for years, but has worsened in recent weeks. The patient has a history of osteoarthritis in this knee and had an arthroscopy in the past. Her left knee used to be more bothersome, but she had a steroid injection to the left knee a few months ago. Since that time, her left knee feels improved, but the right knee has worsened. She has taken ibuprofen in the past with some relief.   Assessment: Will try a short course of NSAIDs to control inflammation and then switch to topical NSAID gel. Given patient's history of surgical intervention, she may benefit from either steroid injections or f/u with her surgeon in the future if her pain is not controlled with voltaren gel.   Plan 1. Ibuprofen for 2 weeks  2. Voltaren gel after course of ibuprofen 3. Reassess at next visit.

## 2018-01-23 NOTE — Patient Instructions (Addendum)
It was nice meeting you today, [name]!  For your diabetes: 1. Stop taking Lantus 2. Start taking Tresiba 30 units daily. You can take this at any time during the day as long as you take it the same time each day.  3. Keep your Novolog at 8 units with meals (three times per day) 4. Continue taking your Metformin and Victoza without any changes 5. Contact your insurance company about obtaining a home glucose monitor.  For your right knee pain 1. Take ibuprofen for 2 weeks. You can take 600 mg every 8 hours as needed for pain. 2. After 2 weeks of ibuprofen, switch to the Voltaren gel.   Come back to clinic in 2-4 weeks for diabetes follow-up

## 2018-01-23 NOTE — Progress Notes (Deleted)
   CC: ***  HPI:  Ms.Tessa C Hursey is a 57 y.o.   Past Medical History:  Diagnosis Date  . Adhesive capsulitis of left shoulder    Steroid injection Dr. Truman Hayward 1/12  . Adhesive capsulitis of right shoulder    Steroid injection Dr. Truman Hayward 1/12  . Adhesive capsulitis of shoulder    bilateral, Steroid injection Dr. Truman Hayward 1/12 bilaterally  . CAD (coronary artery disease)    nonobstructive. Last cardiac cath (2008) showing left circumflex with mid 50% stenosis and distal luminal irregularities. Also with RCA with mid to distal 30-40% stenosis. // Previously evaluated by [facility], never followed up outpatient.  Marland Kitchen CAP (community acquired pneumonia) 06/15/2012   05/2012 CXR: Mild opacification of the posterior lung base on the lateral film  as cannot exclude infection/atelectasis.    . CHF (congestive heart failure) ([place])   . Diabetes mellitus 2007   Type II, insulin dependent  . Diabetic retinopathy   . GERD (gastroesophageal reflux disease)   . Glaucoma   . Hyperlipidemia   . Hypertension   . Obesity   . Peripheral neuropathy    Review of Systems:  ***  Physical Exam:  Vitals:   01/23/18 1525  BP: (!) 122/44  Pulse: (!) 106  Temp: 98.3 F (36.8 C)  TempSrc: Oral  SpO2: 99%  Weight: 266 lb 8 oz (120.9 kg)  Height: 5\' 6"  (1.676 m)   ***  Assessment & Plan:   See Encounters Tab for problem based charting.  Patient {GC/GE:3044014::"discussed with","seen with"} Dr. {NAMES:3044014::"Butcher","Granfortuna","E. Hoffman","Klima","Mullen","Narendra","Raines","Vincent"}

## 2018-01-23 NOTE — Assessment & Plan Note (Signed)
CGM results: [name] wore the CGM for 15 days. The average reading was 198, % time in target was 32, % time below target was 6, and % time above target was 62. Her current regimen includes metformin 1000mg  BID, Victoza 1.8mg  QD, Lantus 35 units qhs, and Novolog 8 units with meals. Based on the CGM results, the patient's blood sugar dips low from about 3am to 6am, which is likely due to her nightly Lantus. At her last visit one week ago, she was advised to take her Lantus in the morning instead of the evening, but she did not make this change. Patient was also advised to increase Novolog from 8 units to 12 units with meals, but she maintained at 8 units. Pt reports she is trying to get more exercise by taking out the trash and walking with her granddaughter. The CGM was removed today. She will contact her insurance company about obtaining a home glucometer.   Plan 1. Discontinue Lantus 2. Start Tresiba 30 units QD  3. Continue 8 units Novolog. Consider increase in this dose at next visit based on her sugars.  4. Continue Metformin and Victoza without dosing changes. 5. F/u in 2-4 weeks for diabetes management.

## 2018-01-24 ENCOUNTER — Other Ambulatory Visit: Payer: Self-pay | Admitting: Internal Medicine

## 2018-01-24 ENCOUNTER — Encounter: Payer: Self-pay | Admitting: Dietician

## 2018-01-24 DIAGNOSIS — M25561 Pain in right knee: Principal | ICD-10-CM

## 2018-01-24 DIAGNOSIS — G609 Hereditary and idiopathic neuropathy, unspecified: Secondary | ICD-10-CM | POA: Diagnosis not present

## 2018-01-24 DIAGNOSIS — G8929 Other chronic pain: Secondary | ICD-10-CM

## 2018-01-24 NOTE — Progress Notes (Signed)
Documentation: Saw  Ms Agro briefly for CGM download #2 and ambulatory glucose profile markup. She did not bring records with her.   Discussed Freestyle Libre Pro CGM  upload with Ms. Short and the doctor. Assisted with interpreting factors that affects glucose variability. Ms. Boeve continues to request her own Freestyle Libre Flash cgm.  Trying to help her find a Medicare supplier who is in network with her insurance.    Note Victoza prescription is supposed to be 1.8mg  daily, but prescription written for 1.2mg  daily  Follow up will be arranged with the patient.  Debera Lat, RD 01/24/2018 1:14 PM.

## 2018-01-25 ENCOUNTER — Other Ambulatory Visit: Payer: Self-pay | Admitting: Dietician

## 2018-01-25 ENCOUNTER — Other Ambulatory Visit: Payer: Self-pay

## 2018-01-25 DIAGNOSIS — E1165 Type 2 diabetes mellitus with hyperglycemia: Secondary | ICD-10-CM

## 2018-01-25 DIAGNOSIS — Z79891 Long term (current) use of opiate analgesic: Secondary | ICD-10-CM

## 2018-01-25 DIAGNOSIS — E1140 Type 2 diabetes mellitus with diabetic neuropathy, unspecified: Secondary | ICD-10-CM

## 2018-01-25 DIAGNOSIS — G609 Hereditary and idiopathic neuropathy, unspecified: Secondary | ICD-10-CM | POA: Diagnosis not present

## 2018-01-25 DIAGNOSIS — IMO0002 Reserved for concepts with insufficient information to code with codable children: Secondary | ICD-10-CM

## 2018-01-25 MED ORDER — OXYCODONE-ACETAMINOPHEN 5-325 MG PO TABS: 1.0000 | ORAL_TABLET | Freq: Three times a day (TID) | ORAL | 0 refills | Status: DC | PRN

## 2018-01-25 NOTE — Telephone Encounter (Signed)
Called [name] and asked her to bring in her logbook of checking her blood sugar 4 times a day. She verbalized understanding and also signed up for group diabetes meeting August 15th.

## 2018-01-25 NOTE — Telephone Encounter (Signed)
3 refills sent to pharmacy on file.   [facility] narcotic database reviewed and appropriate for last 12 months.  Last UDS as expected.   Gilles Chiquito, MD

## 2018-01-25 NOTE — Telephone Encounter (Signed)
I contacted per patient request 2 more Medicare DME suppliers ([employer] and [employer]) for the [employer] CGM order. She needs to check blood sugar 4 times a day per Medicare guidelines. We should be receiving faxed paperwork from [place] this week.

## 2018-01-25 NOTE — Telephone Encounter (Signed)
   oxyCODONE-acetaminophen (PERCOCET/ROXICET) 5-325 MG tablet   Refill request @  [facility] [place], [place] AT [address] [phone] (Phone) [phone] (Fax)

## 2018-01-26 DIAGNOSIS — G609 Hereditary and idiopathic neuropathy, unspecified: Secondary | ICD-10-CM | POA: Diagnosis not present

## 2018-01-27 DIAGNOSIS — G609 Hereditary and idiopathic neuropathy, unspecified: Secondary | ICD-10-CM | POA: Diagnosis not present

## 2018-01-28 ENCOUNTER — Other Ambulatory Visit: Payer: Self-pay | Admitting: Internal Medicine

## 2018-01-28 DIAGNOSIS — M542 Cervicalgia: Secondary | ICD-10-CM

## 2018-01-28 DIAGNOSIS — G609 Hereditary and idiopathic neuropathy, unspecified: Secondary | ICD-10-CM | POA: Diagnosis not present

## 2018-01-29 ENCOUNTER — Other Ambulatory Visit: Payer: Self-pay | Admitting: Internal Medicine

## 2018-01-29 DIAGNOSIS — M542 Cervicalgia: Secondary | ICD-10-CM

## 2018-01-29 DIAGNOSIS — G609 Hereditary and idiopathic neuropathy, unspecified: Secondary | ICD-10-CM | POA: Diagnosis not present

## 2018-01-29 MED ORDER — CYCLOBENZAPRINE HCL 10 MG PO TABS
ORAL_TABLET | ORAL | 0 refills | Status: DC
Start: 2018-01-29 — End: 2018-02-21

## 2018-01-29 MED ORDER — INSULIN PEN NEEDLE 32G X 4 MM MISC: 1 refills | Status: DC

## 2018-01-29 MED ORDER — GLUCOSE BLOOD VI STRP: ORAL_STRIP | 2 refills | Status: DC

## 2018-01-29 NOTE — Telephone Encounter (Signed)
Hi [name],  Its not giving me the option to e-prescribe it.

## 2018-01-29 NOTE — Telephone Encounter (Signed)
The test strip prescription was printed. Can you send it electronically? Thank you!  [name]

## 2018-01-29 NOTE — Addendum Note (Signed)
Addended by: Jean Rosenthal on: 01/29/2018 08:07 PM   Modules accepted: Orders

## 2018-01-30 ENCOUNTER — Other Ambulatory Visit: Payer: Self-pay | Admitting: Internal Medicine

## 2018-01-30 ENCOUNTER — Other Ambulatory Visit: Payer: Self-pay | Admitting: Dietician

## 2018-01-30 DIAGNOSIS — IMO0002 Reserved for concepts with insufficient information to code with codable children: Secondary | ICD-10-CM

## 2018-01-30 DIAGNOSIS — E1140 Type 2 diabetes mellitus with diabetic neuropathy, unspecified: Secondary | ICD-10-CM

## 2018-01-30 DIAGNOSIS — E1165 Type 2 diabetes mellitus with hyperglycemia: Secondary | ICD-10-CM

## 2018-01-30 DIAGNOSIS — G609 Hereditary and idiopathic neuropathy, unspecified: Secondary | ICD-10-CM | POA: Diagnosis not present

## 2018-01-30 MED ORDER — GLUCOSE BLOOD VI STRP: ORAL_STRIP | 1 refills | Status: DC

## 2018-01-31 DIAGNOSIS — G609 Hereditary and idiopathic neuropathy, unspecified: Secondary | ICD-10-CM | POA: Diagnosis not present

## 2018-02-01 DIAGNOSIS — G609 Hereditary and idiopathic neuropathy, unspecified: Secondary | ICD-10-CM | POA: Diagnosis not present

## 2018-02-02 DIAGNOSIS — G609 Hereditary and idiopathic neuropathy, unspecified: Secondary | ICD-10-CM | POA: Diagnosis not present

## 2018-02-02 NOTE — Progress Notes (Signed)
Internal Medicine Clinic Attending  I saw and evaluated the patient.  I personally confirmed the key portions of the history and exam documented by Dr. Dorrell and I reviewed pertinent patient test results.  The assessment, diagnosis, and plan were formulated together and I agree with the documentation in the resident's note.  Alexander Raines, M.D., Ph.D.  

## 2018-02-03 DIAGNOSIS — G609 Hereditary and idiopathic neuropathy, unspecified: Secondary | ICD-10-CM | POA: Diagnosis not present

## 2018-02-04 DIAGNOSIS — G609 Hereditary and idiopathic neuropathy, unspecified: Secondary | ICD-10-CM | POA: Diagnosis not present

## 2018-02-05 ENCOUNTER — Telehealth: Payer: Self-pay | Admitting: Dietician

## 2018-02-05 DIAGNOSIS — G609 Hereditary and idiopathic neuropathy, unspecified: Secondary | ICD-10-CM | POA: Diagnosis not present

## 2018-02-05 NOTE — Telephone Encounter (Signed)
[employer] will work with her to cover the CGM, we should be getting a fax. Told her when she gets it to call for an appointment to learn how to put it on.   Eye exam Dr. Katy Fitch  last one 2 months ago.

## 2018-02-06 DIAGNOSIS — G609 Hereditary and idiopathic neuropathy, unspecified: Secondary | ICD-10-CM | POA: Diagnosis not present

## 2018-02-07 DIAGNOSIS — G609 Hereditary and idiopathic neuropathy, unspecified: Secondary | ICD-10-CM | POA: Diagnosis not present

## 2018-02-08 ENCOUNTER — Other Ambulatory Visit: Payer: Self-pay | Admitting: Internal Medicine

## 2018-02-08 ENCOUNTER — Ambulatory Visit: Payer: Medicare HMO | Admitting: Dietician

## 2018-02-08 DIAGNOSIS — I2510 Atherosclerotic heart disease of native coronary artery without angina pectoris: Secondary | ICD-10-CM

## 2018-02-08 DIAGNOSIS — G609 Hereditary and idiopathic neuropathy, unspecified: Secondary | ICD-10-CM | POA: Diagnosis not present

## 2018-02-08 DIAGNOSIS — I10 Essential (primary) hypertension: Secondary | ICD-10-CM

## 2018-02-09 ENCOUNTER — Telehealth: Payer: Self-pay | Admitting: Internal Medicine

## 2018-02-09 ENCOUNTER — Encounter: Payer: Self-pay | Admitting: *Deleted

## 2018-02-09 DIAGNOSIS — G609 Hereditary and idiopathic neuropathy, unspecified: Secondary | ICD-10-CM | POA: Diagnosis not present

## 2018-02-09 NOTE — Telephone Encounter (Signed)
[place] patient last seen on March of 2018.  Records have been faxed and Rec'd and given to [facility].  Patient is sch for an appt on 03/20/2018 @ 8am to return.

## 2018-02-10 DIAGNOSIS — G609 Hereditary and idiopathic neuropathy, unspecified: Secondary | ICD-10-CM | POA: Diagnosis not present

## 2018-02-11 DIAGNOSIS — G609 Hereditary and idiopathic neuropathy, unspecified: Secondary | ICD-10-CM | POA: Diagnosis not present

## 2018-02-12 DIAGNOSIS — G609 Hereditary and idiopathic neuropathy, unspecified: Secondary | ICD-10-CM | POA: Diagnosis not present

## 2018-02-13 DIAGNOSIS — G609 Hereditary and idiopathic neuropathy, unspecified: Secondary | ICD-10-CM | POA: Diagnosis not present

## 2018-02-14 DIAGNOSIS — G609 Hereditary and idiopathic neuropathy, unspecified: Secondary | ICD-10-CM | POA: Diagnosis not present

## 2018-02-15 DIAGNOSIS — G609 Hereditary and idiopathic neuropathy, unspecified: Secondary | ICD-10-CM | POA: Diagnosis not present

## 2018-02-16 DIAGNOSIS — G609 Hereditary and idiopathic neuropathy, unspecified: Secondary | ICD-10-CM | POA: Diagnosis not present

## 2018-02-17 DIAGNOSIS — G609 Hereditary and idiopathic neuropathy, unspecified: Secondary | ICD-10-CM | POA: Diagnosis not present

## 2018-02-18 DIAGNOSIS — G609 Hereditary and idiopathic neuropathy, unspecified: Secondary | ICD-10-CM | POA: Diagnosis not present

## 2018-02-19 DIAGNOSIS — G609 Hereditary and idiopathic neuropathy, unspecified: Secondary | ICD-10-CM | POA: Diagnosis not present

## 2018-02-20 DIAGNOSIS — G609 Hereditary and idiopathic neuropathy, unspecified: Secondary | ICD-10-CM | POA: Diagnosis not present

## 2018-02-21 ENCOUNTER — Ambulatory Visit (INDEPENDENT_AMBULATORY_CARE_PROVIDER_SITE_OTHER): Payer: Medicare HMO | Admitting: Internal Medicine

## 2018-02-21 VITALS — BP 154/67 | HR 100 | Temp 97.5°F | Wt 264.8 lb

## 2018-02-21 DIAGNOSIS — G8929 Other chronic pain: Secondary | ICD-10-CM | POA: Insufficient documentation

## 2018-02-21 DIAGNOSIS — E1140 Type 2 diabetes mellitus with diabetic neuropathy, unspecified: Secondary | ICD-10-CM

## 2018-02-21 DIAGNOSIS — M5126 Other intervertebral disc displacement, lumbar region: Secondary | ICD-10-CM | POA: Diagnosis not present

## 2018-02-21 DIAGNOSIS — M4802 Spinal stenosis, cervical region: Secondary | ICD-10-CM

## 2018-02-21 DIAGNOSIS — Z981 Arthrodesis status: Secondary | ICD-10-CM

## 2018-02-21 DIAGNOSIS — M170 Bilateral primary osteoarthritis of knee: Secondary | ICD-10-CM

## 2018-02-21 DIAGNOSIS — E118 Type 2 diabetes mellitus with unspecified complications: Secondary | ICD-10-CM

## 2018-02-21 DIAGNOSIS — M545 Low back pain, unspecified: Secondary | ICD-10-CM | POA: Insufficient documentation

## 2018-02-21 DIAGNOSIS — Z79899 Other long term (current) drug therapy: Secondary | ICD-10-CM

## 2018-02-21 DIAGNOSIS — M47816 Spondylosis without myelopathy or radiculopathy, lumbar region: Secondary | ICD-10-CM | POA: Diagnosis not present

## 2018-02-21 DIAGNOSIS — Z79891 Long term (current) use of opiate analgesic: Secondary | ICD-10-CM

## 2018-02-21 DIAGNOSIS — Z23 Encounter for immunization: Secondary | ICD-10-CM

## 2018-02-21 DIAGNOSIS — Z794 Long term (current) use of insulin: Secondary | ICD-10-CM

## 2018-02-21 DIAGNOSIS — IMO0002 Reserved for concepts with insufficient information to code with codable children: Secondary | ICD-10-CM

## 2018-02-21 DIAGNOSIS — E1165 Type 2 diabetes mellitus with hyperglycemia: Secondary | ICD-10-CM

## 2018-02-21 DIAGNOSIS — G609 Hereditary and idiopathic neuropathy, unspecified: Secondary | ICD-10-CM | POA: Diagnosis not present

## 2018-02-21 LAB — GLUCOSE, CAPILLARY: Glucose-Capillary: 327 mg/dL — ABNORMAL HIGH (ref 70–99)

## 2018-02-21 LAB — POCT GLYCOSYLATED HEMOGLOBIN (HGB A1C): HEMOGLOBIN A1C: 10.1 % — AB (ref 4.0–5.6)

## 2018-02-21 MED ORDER — INSULIN DEGLUDEC 200 UNIT/ML ~~LOC~~ SOPN: 30.0000 [IU] | PEN_INJECTOR | Freq: Every day | SUBCUTANEOUS | 1 refills | Status: DC

## 2018-02-21 NOTE — Assessment & Plan Note (Signed)
Acute on chronic low back pain: [name] is known to have chronic pain which is thought to be multifactorial secondary to osteoarthritis of the knees, chronic low back pain 2/2 osteoarthritis and disc protrusion, cervical spinal stenosis s/p anterior and posterior fixation in December 2017 and diabetic neuropathy.   Today, she present with 2-day history of dull lower back pain.  Initially pain was 10/10 but has significantly improved with home Percocet and currently rating pain at 8/10.  Reports that pain is worsened with rotation lying supine.  Currently she denies any radiculopathic symptoms, lower extremity weakness, bowel or bladder incontinence, fevers, chills.  She also reports stiffness of her lower back and spasm.  On physical exam, she had tenderness at the lower back and paraspinal, negative straight leg test, she had 5/5 strength bilaterally at the lower extremity with normal reflex.   Plan: - Patient on chronic opioid (Percocet) -Refer to physical therapy -Encouraged to continue taking Flexeril

## 2018-02-21 NOTE — Assessment & Plan Note (Addendum)
Ms. Dishon was last seen at the clinic on 7/34/2019 for CGM read.  She was noted to have average BG of 198 with several hypoglycemic episodes.  She was previously on Lantus 35U qhs, metformin 1000 mg twice daily, Victoza 1.8 mg daily and NovoLog 8U w/ meals.  During that visit, Lantus was discontinued and she was started on Tresiba 30U qd.  Since then, her blood glucose has ranged from 125-150s no apparent hypoglycemic episodes.  She reported of needing a refill for [place] today  Plan: -A1c today (previous A1c of 11.5 on 5/19) -Continue Tresiba 30 units daily -Continue NovoLog 8 units with meals -Continue metformin 1000 mg twice daily -Continue Victoza 1.8 mg daily - Return to clinic in 2 to 4 weeks for continuity visit. -Advised on continuous glucose monitoring at home.  ADDENDUM: A1C of 10.1% today

## 2018-02-21 NOTE — Progress Notes (Signed)
CC: Lower back pain   HPI:  [name] is a 57 y.o. with medical history as listed below presenting to follow-up on chronic low back pain.  See problem based assessment below for further details.  Acute on chronic low back pain: [name] is known to have chronic pain which is thought to be multifactorial secondary to osteoarthritis of the knees, chronic low back pain 2/2 osteoarthritis and disc protrusion, cervical spinal stenosis s/p anterior and posterior fixation in December 2017 and diabetic neuropathy.   Today, she present with 2-day history of dull lower back pain.  Initially pain was 8/10 but has significantly improved with home Percocet and currently rating pain at 8/10.  Reports that pain is worsened with rotation lying supine.  Currently she denies any radiculopathic symptoms, lower extremity weakness, bowel or bladder incontinence, fevers, chills.  She also reports stiffness of her lower back and spasm.  On physical exam, she had tenderness at the lower back and paraspinal, negative straight leg test, she had 5/5 strength bilaterally at the lower extremity with normal reflex.   Plan: - Patient on chronic opioid (Percocet) -Refer to physical therapy -Encouraged to continue taking Flexeril   Uncontrolled diabetes mellitus: [name] was last seen at the clinic on 7/34/2019 for CGM read.  She was noted to have average BG of 198 with several hypoglycemic episodes.  She was previously on Lantus 35U qhs, metformin 1000 mg twice daily, Victoza 1.8 mg daily and NovoLog 8U w/ meals.  During that visit, Lantus was discontinued and she was started on Tresiba 30U qd.  Since then, her blood glucose has ranged from 125-150s no apparent hypoglycemic episodes.  She reported of needing a refill for [place] today  Plan: -A1c today (previous A1c of 11.5 on 5/19) -Continue Tresiba 30 units daily -Continue NovoLog 8 units with meals -Continue metformin 1000 mg twice daily -Continue  Victoza 1.8 mg daily - Return to clinic in 2 to 4 weeks for continuity visit. -Advised on continuous glucose monitoring at home.  ADDENDUM: A1C of 10.1% today  Past Medical History:  Diagnosis Date  . Adhesive capsulitis of left shoulder    Steroid injection Dr. Truman Hayward 1/12  . Adhesive capsulitis of right shoulder    Steroid injection Dr. Truman Hayward 1/12  . Adhesive capsulitis of shoulder    bilateral, Steroid injection Dr. Truman Hayward 1/12 bilaterally  . CAD (coronary artery disease)    nonobstructive. Last cardiac cath (2008) showing left circumflex with mid 50% stenosis and distal luminal irregularities. Also with RCA with mid to distal 30-40% stenosis. // Previously evaluated by [facility], never followed up outpatient.  Marland Kitchen CAP (community acquired pneumonia) 06/15/2012   05/2012 CXR: Mild opacification of the posterior lung base on the lateral film  as cannot exclude infection/atelectasis.    . CHF (congestive heart failure) ([place])   . Diabetes mellitus 2007   Type II, insulin dependent  . Diabetic retinopathy   . GERD (gastroesophageal reflux disease)   . Glaucoma   . Hyperlipidemia   . Hypertension   . Obesity   . Peripheral neuropathy    Review of Systems:  As per HPI   Physical Exam:  Vitals:   02/21/18 0908  BP: (!) 154/67  Pulse: 100  Temp: (!) 97.5 F (36.4 C)  TempSrc: Oral  SpO2: 99%  Weight: 264 lb 12.8 oz (120.1 kg)   Physical Exam  Constitutional: She is well-developed, well-nourished, and in no distress. No distress.  Cardiovascular: Normal rate,  regular rhythm and normal heart sounds. Exam reveals no gallop and no friction rub.  No murmur heard. Pulmonary/Chest: Effort normal and breath sounds normal.  Abdominal: Soft. Bowel sounds are normal. She exhibits no distension. There is no tenderness.  Musculoskeletal:  Tender to palpation at the lower back bilaterally including paraspinal muscle tenderness.  Mild pain on rotation of trunk.  Neurological: She is  alert. She has normal sensation, normal strength, normal reflexes and intact cranial nerves. Gait normal.     Assessment & Plan:   See Encounters Tab for problem based charting.  Patient discussed with Dr. Dareen Piano

## 2018-02-21 NOTE — Patient Instructions (Addendum)
Ms. Antwi,  It was a pleasure taking care of you here at the clinic today and sorry to hear about your ongoing back pain.  Here are my recommendations after today's visit:  1.  I believe he would tremendously benefit from physical therapy and I have placed a referral for that 2.  Continue taking the muscle spasm medication 3.  Continue taking the pain medication (Percocet) you have at home.  I will see you back in 2 to 4 weeks for continuity visit.  ~Dr. Eileen Stanford

## 2018-02-22 ENCOUNTER — Telehealth: Payer: Self-pay

## 2018-02-22 DIAGNOSIS — G609 Hereditary and idiopathic neuropathy, unspecified: Secondary | ICD-10-CM | POA: Diagnosis not present

## 2018-02-22 NOTE — Telephone Encounter (Signed)
Thank you Hme.

## 2018-02-22 NOTE — Telephone Encounter (Signed)
Calling to follow up on CGM.

## 2018-02-22 NOTE — Progress Notes (Signed)
Internal Medicine Clinic Attending  I saw and evaluated the patient.  I personally confirmed the key portions of the history and exam documented by Dr. Agyei and I reviewed pertinent patient test results.  The assessment, diagnosis, and plan were formulated together and I agree with the documentation in the resident's note.  

## 2018-02-22 NOTE — Telephone Encounter (Signed)
Requesting lab results. Pleasei call pt back.

## 2018-02-23 ENCOUNTER — Other Ambulatory Visit: Payer: Self-pay

## 2018-02-23 ENCOUNTER — Encounter: Payer: Self-pay | Admitting: Cardiology

## 2018-02-23 ENCOUNTER — Telehealth: Payer: Self-pay | Admitting: Dietician

## 2018-02-23 DIAGNOSIS — G609 Hereditary and idiopathic neuropathy, unspecified: Secondary | ICD-10-CM | POA: Diagnosis not present

## 2018-02-23 NOTE — Telephone Encounter (Signed)
Called [name] about documentation request from [place]. She is checking her blood sugar 4x/day. She takes trsiba insulin once time a day and adjusts her Novolog insulin before meals three times a day: takes 12 units Novolog when blood sugar is high before meals  and 8 units Novolog when lower.  She verbalized understanding to bring her meter in to the office for all visits.

## 2018-02-23 NOTE — Telephone Encounter (Signed)
Spoke with Ms. Piloto about [facility] request for documentation. See note from today.

## 2018-02-23 NOTE — Telephone Encounter (Signed)
oxyCODONE-acetaminophen (PERCOCET/ROXICET) 5-325 MG tablet, refill request @  [facility], [place] AT [place] [phone] (Phone) [phone] (Fax)

## 2018-02-23 NOTE — Telephone Encounter (Signed)
Pt should have 2 Oxycodone  rxs left - I called pt to let her know, stated she wanted to be sure d/t to upcoming w/e and holiday.

## 2018-02-24 DIAGNOSIS — G609 Hereditary and idiopathic neuropathy, unspecified: Secondary | ICD-10-CM | POA: Diagnosis not present

## 2018-02-25 DIAGNOSIS — G609 Hereditary and idiopathic neuropathy, unspecified: Secondary | ICD-10-CM | POA: Diagnosis not present

## 2018-02-26 DIAGNOSIS — G609 Hereditary and idiopathic neuropathy, unspecified: Secondary | ICD-10-CM | POA: Diagnosis not present

## 2018-02-27 ENCOUNTER — Ambulatory Visit: Payer: Medicare HMO | Admitting: Cardiology

## 2018-02-27 ENCOUNTER — Encounter

## 2018-02-27 DIAGNOSIS — G609 Hereditary and idiopathic neuropathy, unspecified: Secondary | ICD-10-CM | POA: Diagnosis not present

## 2018-02-27 NOTE — Progress Notes (Deleted)
Cardiology Office Note:    Date:  02/27/2018   ID:  [name], DOB Jun 18, 1961, MRN [id number]  PCP:  Jean Rosenthal, MD  Cardiologist:  Sherren Mocha, MD  Referring MD: Jean Rosenthal, MD   No chief complaint on file. ***  History of Present Illness:    [name] is a 57 y.o. female with a past medical history significant for hemic cardiomyopathy and nonobstructive coronary artery disease, DM type II, GERD, hypertension, hyperlipidemia, peripheral neuropathy.  Past Medical History:  Diagnosis Date  . Adhesive capsulitis of left shoulder    Steroid injection Dr. Truman Hayward 1/12  . Adhesive capsulitis of right shoulder    Steroid injection Dr. Truman Hayward 1/12  . Adhesive capsulitis of shoulder    bilateral, Steroid injection Dr. Truman Hayward 1/12 bilaterally  . CAD (coronary artery disease)    nonobstructive. Last cardiac cath (2008) showing left circumflex with mid 50% stenosis and distal luminal irregularities. Also with RCA with mid to distal 30-40% stenosis. // Previously evaluated by [facility], never followed up outpatient.  Marland Kitchen CAP (community acquired pneumonia) 06/15/2012   05/2012 CXR: Mild opacification of the posterior lung base on the lateral film  as cannot exclude infection/atelectasis.    . CHF (congestive heart failure) ([place])   . Diabetes mellitus 2007   Type II, insulin dependent  . Diabetic retinopathy   . GERD (gastroesophageal reflux disease)   . Glaucoma   . Hyperlipidemia   . Hypertension   . Obesity   . Peripheral neuropathy     Past Surgical History:  Procedure Laterality Date  . ANTERIOR CERVICAL DECOMP/DISCECTOMY FUSION N/A 06/16/2016   Procedure: Anterior Cervical Decompression/discectomy Fusion - Cervical six - Cervical seven;  Surgeon: Eustace Moore, MD;  Location: [place];  Service: Neurosurgery;  Laterality: N/A;  Anterior Cervical Decompression/discectomy Fusion - Cervical six - Cervical seven  . APPENDECTOMY    . CARDIAC CATHETERIZATION    .  CATARACT EXTRACTION    . GLAUCOMA SURGERY    . LEFT HEART CATHETERIZATION WITH CORONARY ANGIOGRAM N/A 01/24/2012   Procedure: LEFT HEART CATHETERIZATION WITH CORONARY ANGIOGRAM;  Surgeon: Sherren Mocha, MD;  Location: [facility] CATH LAB;  Service: Cardiovascular;  Laterality: N/A;  . POSTERIOR CERVICAL FUSION/FORAMINOTOMY N/A 10/22/2015   Procedure: Cervical three-Cervical Seven Posterior cervical fusion with lateral mass fixation,  Laminectomy Cervical Three-Cervical Seven;  Surgeon: Eustace Moore, MD;  Location: [facility] NEURO ORS;  Service: Neurosurgery;  Laterality: N/A;  Posterior  . TUBAL LIGATION      Current Medications: No outpatient medications have been marked as taking for the 02/27/18 encounter (Appointment) with Daune Perch, NP.     Allergies:   Patient has no known allergies.   Social History   Socioeconomic History  . Marital status: Single    Spouse name: Not on file  . Number of children: 2  . Years of education: 12th grade  . Highest education level: Not on file  Occupational History  . Occupation: Development worker, community: UNEMPLOYED    Comment: used to work in [employer] fast streamer at [employer]). Got disability in 2007.   Social Needs  . Financial resource strain: Not on file  . Food insecurity:    Worry: Not on file    Inability: Not on file  . Transportation needs:    Medical: Not on file    Non-medical: Not on file  Tobacco Use  . Smoking status: Former Smoker    Packs/day: 0.30  Years: 7.00    Pack years: 2.10    Types: Cigarettes    Last attempt to quit: 06/27/2006    Years since quitting: 11.6  . Smokeless tobacco: Never Used  Substance and Sexual Activity  . Alcohol use: No    Alcohol/week: 0.0 standard drinks  . Drug use: No  . Sexual activity: Not Currently  Lifestyle  . Physical activity:    Days per week: Not on file    Minutes per session: Not on file  . Stress: Not on file  Relationships  . Social connections:    Talks on phone:  Not on file    Gets together: Not on file    Attends religious service: Not on file    Active member of club or organization: Not on file    Attends meetings of clubs or organizations: Not on file    Relationship status: Not on file  Other Topics Concern  . Not on file  Social History Narrative   Lives with her kids, 51yo daughter and 83 yo adopted son.     Family History: The patient's ***family history includes Hypertension in her sister, sister, and sister. There is no history of Heart attack or Stroke. ROS:   Please see the history of present illness.    *** All other systems reviewed and are negative.  EKGs/Labs/Other Studies Reviewed:    The following studies were reviewed today:  Echocardiogram 02/07/2017 Study Conclusions - Left ventricle: The cavity size was normal. Systolic function was   normal. The estimated ejection fraction was in the range of 50%   to 55%. Wall motion was normal; there were no regional wall   motion abnormalities. Doppler parameters are consistent with   abnormal left ventricular relaxation (grade 1 diastolic   dysfunction). Doppler parameters are consistent with high   ventricular filling pressure. - Aortic valve: Transvalvular velocity was within the normal range.   There was no stenosis. There was no regurgitation. - Mitral valve: Transvalvular velocity was within the normal range.   There was no evidence for stenosis. There was no regurgitation. - Right ventricle: Systolic function was normal. - Tricuspid valve: There was no regurgitation.  EKG:  EKG is *** ordered today.  The ekg ordered today demonstrates ***  Recent Labs: 10/11/2017: BUN 16; Creatinine, Ser 1.03; Hemoglobin 11.2; Platelets 283; Potassium 4.8; Sodium 136   Recent Lipid Panel    Component Value Date/Time   CHOL 222 (H) 02/17/2017 1633   TRIG 245 (H) 02/17/2017 1633   HDL 54 02/17/2017 1633   CHOLHDL 4.1 02/17/2017 1633   CHOLHDL 3.6 05/01/2015 0109   VLDL 43 (H)  05/01/2015 0109   LDLCALC 119 (H) 02/17/2017 1633    Physical Exam:    VS:  LMP 02/13/2011     Wt Readings from Last 3 Encounters:  02/21/18 264 lb 12.8 oz (120.1 kg)  01/23/18 266 lb 8 oz (120.9 kg)  01/10/18 263 lb 9.6 oz (119.6 kg)     Physical Exam***   ASSESSMENT:    No diagnosis found. PLAN:    In order of problems listed above:  Chronic systolic heart failure: History of EF as low as 35-40% in 2013.  Most recent echo in 2018 demonstrated EF 50-55%.  Continues on carvedilol, furosemide, and lisinopril.  Nonobstructive CAD: Heart catheterization in 2008 with 30-40% distal RCA stenosis and 50% mid circumflex stenosis.  Continue medical management.  No symptoms of angina.  Hypertension: On lisinopril and 20 mg daily, carvedilol  6.25 mg twice daily, Lasix 20 mg daily,  Hyperlipidemia: Pravastatin 20 mg daily.  Last LDL 02/17/2017 was 119  Diabetes type 2 on insulin: uncontrolled.  A1c was 10.1 02/21/18. (Previously 11.5)  Morbid obesity   Medication Adjustments/Labs and Tests Ordered: Current medicines are reviewed at length with the patient today.  Concerns regarding medicines are outlined above. Labs and tests ordered and medication changes are outlined in the patient instructions below:  There are no Patient Instructions on file for this visit.   Signed, Daune Perch, NP  02/27/2018 5:43 AM    [facility]

## 2018-02-28 DIAGNOSIS — G609 Hereditary and idiopathic neuropathy, unspecified: Secondary | ICD-10-CM | POA: Diagnosis not present

## 2018-03-01 ENCOUNTER — Encounter: Payer: Self-pay | Admitting: Cardiology

## 2018-03-01 DIAGNOSIS — G609 Hereditary and idiopathic neuropathy, unspecified: Secondary | ICD-10-CM | POA: Diagnosis not present

## 2018-03-02 DIAGNOSIS — G609 Hereditary and idiopathic neuropathy, unspecified: Secondary | ICD-10-CM | POA: Diagnosis not present

## 2018-03-02 DIAGNOSIS — E1140 Type 2 diabetes mellitus with diabetic neuropathy, unspecified: Secondary | ICD-10-CM | POA: Diagnosis not present

## 2018-03-03 DIAGNOSIS — G609 Hereditary and idiopathic neuropathy, unspecified: Secondary | ICD-10-CM | POA: Diagnosis not present

## 2018-03-06 ENCOUNTER — Other Ambulatory Visit: Payer: Self-pay | Admitting: Internal Medicine

## 2018-03-06 DIAGNOSIS — K219 Gastro-esophageal reflux disease without esophagitis: Secondary | ICD-10-CM

## 2018-03-06 DIAGNOSIS — I10 Essential (primary) hypertension: Secondary | ICD-10-CM

## 2018-03-06 DIAGNOSIS — M542 Cervicalgia: Secondary | ICD-10-CM

## 2018-03-07 ENCOUNTER — Other Ambulatory Visit: Payer: Self-pay | Admitting: Internal Medicine

## 2018-03-07 DIAGNOSIS — B86 Scabies: Secondary | ICD-10-CM

## 2018-03-07 NOTE — Telephone Encounter (Signed)
Next appt 10/4 with PCP.

## 2018-03-08 ENCOUNTER — Telehealth: Payer: Self-pay | Admitting: Dietician

## 2018-03-08 ENCOUNTER — Ambulatory Visit: Payer: Medicare HMO | Attending: Internal Medicine | Admitting: Physical Therapy

## 2018-03-08 MED ORDER — FUROSEMIDE 20 MG PO TABS: 20.0000 mg | ORAL_TABLET | Freq: Every day | ORAL | 0 refills | Status: DC

## 2018-03-08 MED ORDER — PANTOPRAZOLE SODIUM 40 MG PO TBEC: 40.0000 mg | DELAYED_RELEASE_TABLET | Freq: Every day | ORAL | 0 refills | Status: AC

## 2018-03-08 NOTE — Addendum Note (Signed)
Addended by: Hulan Fray on: 03/08/2018 06:18 PM   Modules accepted: Orders

## 2018-03-08 NOTE — Telephone Encounter (Signed)
Patient received her .Continuous glucose monitoring system. She scheduled and appointment for Monday to learn how to use it. She agreed to bring her daughter to learn also to support her.

## 2018-03-10 ENCOUNTER — Other Ambulatory Visit: Payer: Self-pay | Admitting: Internal Medicine

## 2018-03-10 DIAGNOSIS — M542 Cervicalgia: Secondary | ICD-10-CM

## 2018-03-11 DIAGNOSIS — G609 Hereditary and idiopathic neuropathy, unspecified: Secondary | ICD-10-CM | POA: Diagnosis not present

## 2018-03-12 ENCOUNTER — Ambulatory Visit (INDEPENDENT_AMBULATORY_CARE_PROVIDER_SITE_OTHER): Payer: Medicare HMO | Admitting: Dietician

## 2018-03-12 DIAGNOSIS — IMO0002 Reserved for concepts with insufficient information to code with codable children: Secondary | ICD-10-CM

## 2018-03-12 DIAGNOSIS — E1140 Type 2 diabetes mellitus with diabetic neuropathy, unspecified: Secondary | ICD-10-CM

## 2018-03-12 DIAGNOSIS — E1165 Type 2 diabetes mellitus with hyperglycemia: Secondary | ICD-10-CM

## 2018-03-12 DIAGNOSIS — Z713 Dietary counseling and surveillance: Secondary | ICD-10-CM

## 2018-03-12 DIAGNOSIS — G609 Hereditary and idiopathic neuropathy, unspecified: Secondary | ICD-10-CM | POA: Diagnosis not present

## 2018-03-12 MED ORDER — CYCLOBENZAPRINE HCL 10 MG PO TABS: 10.0000 mg | ORAL_TABLET | Freq: Two times a day (BID) | ORAL | 0 refills | Status: DC | PRN

## 2018-03-12 NOTE — Progress Notes (Signed)
Documentation: patient brought her own Freestyle Libre Continuous glucose monitoring reader and sensor today to learn how to use it. Her daughter, Beau Fanny, was present today to learn how to use the Freestyle Libre  to be a support for her mother. Freestyle Libre Pro CGM sensor placed and started today. Patient was educated about placing, wearing and caring for the freestyle libre Continuous glucose monitoring sensor, scanning at least 4 times a day. Beau Fanny says she feels confident that she and her mother can place the new sensor in 14 days.  Follow up was arranged with the patient.  Patient educated when to use a meter to check her blood sugar, how to use the information supplied to her by the Continuous glucose monitor. At least 30 minutes was spent educating patient and her daughter.   Debera Lat, RD 03/12/2018 1:17 PM.

## 2018-03-12 NOTE — Patient Instructions (Signed)
Carry reader and meter with you at all times.  Scan sensor at least 4 times a day- the most important-is before bed and when you wake up..  Can order Skin Tac bottle on [place] for about $8.00 to make skin sticky  Can order [facility] to cover it.   [name] Makenli Derstine [phone]

## 2018-03-13 DIAGNOSIS — G609 Hereditary and idiopathic neuropathy, unspecified: Secondary | ICD-10-CM | POA: Diagnosis not present

## 2018-03-14 DIAGNOSIS — G609 Hereditary and idiopathic neuropathy, unspecified: Secondary | ICD-10-CM | POA: Diagnosis not present

## 2018-03-15 DIAGNOSIS — G609 Hereditary and idiopathic neuropathy, unspecified: Secondary | ICD-10-CM | POA: Diagnosis not present

## 2018-03-16 DIAGNOSIS — G609 Hereditary and idiopathic neuropathy, unspecified: Secondary | ICD-10-CM | POA: Diagnosis not present

## 2018-03-17 DIAGNOSIS — G609 Hereditary and idiopathic neuropathy, unspecified: Secondary | ICD-10-CM | POA: Diagnosis not present

## 2018-03-18 DIAGNOSIS — G609 Hereditary and idiopathic neuropathy, unspecified: Secondary | ICD-10-CM | POA: Diagnosis not present

## 2018-03-19 DIAGNOSIS — G609 Hereditary and idiopathic neuropathy, unspecified: Secondary | ICD-10-CM | POA: Diagnosis not present

## 2018-03-20 DIAGNOSIS — G609 Hereditary and idiopathic neuropathy, unspecified: Secondary | ICD-10-CM | POA: Diagnosis not present

## 2018-03-21 DIAGNOSIS — Z961 Presence of intraocular lens: Secondary | ICD-10-CM | POA: Diagnosis not present

## 2018-03-21 DIAGNOSIS — E133553 Other specified diabetes mellitus with stable proliferative diabetic retinopathy, bilateral: Secondary | ICD-10-CM | POA: Diagnosis not present

## 2018-03-21 DIAGNOSIS — H401133 Primary open-angle glaucoma, bilateral, severe stage: Secondary | ICD-10-CM | POA: Diagnosis not present

## 2018-03-21 DIAGNOSIS — G609 Hereditary and idiopathic neuropathy, unspecified: Secondary | ICD-10-CM | POA: Diagnosis not present

## 2018-03-21 DIAGNOSIS — E103553 Type 1 diabetes mellitus with stable proliferative diabetic retinopathy, bilateral: Secondary | ICD-10-CM | POA: Diagnosis not present

## 2018-03-21 LAB — HM DIABETES EYE EXAM

## 2018-03-22 DIAGNOSIS — G609 Hereditary and idiopathic neuropathy, unspecified: Secondary | ICD-10-CM | POA: Diagnosis not present

## 2018-03-23 DIAGNOSIS — G609 Hereditary and idiopathic neuropathy, unspecified: Secondary | ICD-10-CM | POA: Diagnosis not present

## 2018-03-24 ENCOUNTER — Other Ambulatory Visit: Payer: Self-pay | Admitting: Internal Medicine

## 2018-03-24 DIAGNOSIS — G609 Hereditary and idiopathic neuropathy, unspecified: Secondary | ICD-10-CM | POA: Diagnosis not present

## 2018-03-24 DIAGNOSIS — K219 Gastro-esophageal reflux disease without esophagitis: Secondary | ICD-10-CM

## 2018-03-24 DIAGNOSIS — I10 Essential (primary) hypertension: Secondary | ICD-10-CM

## 2018-03-25 DIAGNOSIS — G609 Hereditary and idiopathic neuropathy, unspecified: Secondary | ICD-10-CM | POA: Diagnosis not present

## 2018-03-26 DIAGNOSIS — G609 Hereditary and idiopathic neuropathy, unspecified: Secondary | ICD-10-CM | POA: Diagnosis not present

## 2018-03-26 NOTE — Telephone Encounter (Signed)
Spoke w/ pt, she has scripts for pain med at pharm, she will call pharm. Confirmed appts 10/4

## 2018-03-27 ENCOUNTER — Ambulatory Visit: Payer: Medicare HMO | Admitting: Dietician

## 2018-03-27 ENCOUNTER — Encounter: Payer: Self-pay | Admitting: Internal Medicine

## 2018-03-27 DIAGNOSIS — G609 Hereditary and idiopathic neuropathy, unspecified: Secondary | ICD-10-CM | POA: Diagnosis not present

## 2018-03-28 DIAGNOSIS — G609 Hereditary and idiopathic neuropathy, unspecified: Secondary | ICD-10-CM | POA: Diagnosis not present

## 2018-03-29 DIAGNOSIS — G609 Hereditary and idiopathic neuropathy, unspecified: Secondary | ICD-10-CM | POA: Diagnosis not present

## 2018-03-30 ENCOUNTER — Ambulatory Visit: Payer: Medicare HMO | Admitting: Dietician

## 2018-03-30 ENCOUNTER — Encounter: Payer: Medicare HMO | Admitting: Internal Medicine

## 2018-03-30 ENCOUNTER — Encounter: Payer: Self-pay | Admitting: Internal Medicine

## 2018-03-30 DIAGNOSIS — G609 Hereditary and idiopathic neuropathy, unspecified: Secondary | ICD-10-CM | POA: Diagnosis not present

## 2018-03-31 DIAGNOSIS — G609 Hereditary and idiopathic neuropathy, unspecified: Secondary | ICD-10-CM | POA: Diagnosis not present

## 2018-04-01 DIAGNOSIS — G609 Hereditary and idiopathic neuropathy, unspecified: Secondary | ICD-10-CM | POA: Diagnosis not present

## 2018-04-02 DIAGNOSIS — G609 Hereditary and idiopathic neuropathy, unspecified: Secondary | ICD-10-CM | POA: Diagnosis not present

## 2018-04-03 ENCOUNTER — Encounter: Payer: Self-pay | Admitting: *Deleted

## 2018-04-03 ENCOUNTER — Encounter: Payer: Self-pay | Admitting: Internal Medicine

## 2018-04-03 DIAGNOSIS — G609 Hereditary and idiopathic neuropathy, unspecified: Secondary | ICD-10-CM | POA: Diagnosis not present

## 2018-04-04 DIAGNOSIS — G609 Hereditary and idiopathic neuropathy, unspecified: Secondary | ICD-10-CM | POA: Diagnosis not present

## 2018-04-05 ENCOUNTER — Other Ambulatory Visit: Payer: Self-pay | Admitting: Internal Medicine

## 2018-04-05 DIAGNOSIS — G609 Hereditary and idiopathic neuropathy, unspecified: Secondary | ICD-10-CM | POA: Diagnosis not present

## 2018-04-05 DIAGNOSIS — E1140 Type 2 diabetes mellitus with diabetic neuropathy, unspecified: Secondary | ICD-10-CM

## 2018-04-05 DIAGNOSIS — E1165 Type 2 diabetes mellitus with hyperglycemia: Secondary | ICD-10-CM

## 2018-04-05 DIAGNOSIS — IMO0002 Reserved for concepts with insufficient information to code with codable children: Secondary | ICD-10-CM

## 2018-04-06 DIAGNOSIS — G609 Hereditary and idiopathic neuropathy, unspecified: Secondary | ICD-10-CM | POA: Diagnosis not present

## 2018-04-07 DIAGNOSIS — G609 Hereditary and idiopathic neuropathy, unspecified: Secondary | ICD-10-CM | POA: Diagnosis not present

## 2018-04-08 DIAGNOSIS — G609 Hereditary and idiopathic neuropathy, unspecified: Secondary | ICD-10-CM | POA: Diagnosis not present

## 2018-04-09 ENCOUNTER — Other Ambulatory Visit: Payer: Self-pay | Admitting: Internal Medicine

## 2018-04-09 DIAGNOSIS — E1140 Type 2 diabetes mellitus with diabetic neuropathy, unspecified: Secondary | ICD-10-CM

## 2018-04-09 DIAGNOSIS — IMO0002 Reserved for concepts with insufficient information to code with codable children: Secondary | ICD-10-CM

## 2018-04-09 DIAGNOSIS — G609 Hereditary and idiopathic neuropathy, unspecified: Secondary | ICD-10-CM | POA: Diagnosis not present

## 2018-04-09 DIAGNOSIS — E1165 Type 2 diabetes mellitus with hyperglycemia: Secondary | ICD-10-CM

## 2018-04-10 ENCOUNTER — Telehealth: Payer: Self-pay | Admitting: Dietician

## 2018-04-10 DIAGNOSIS — G609 Hereditary and idiopathic neuropathy, unspecified: Secondary | ICD-10-CM | POA: Diagnosis not present

## 2018-04-10 NOTE — Telephone Encounter (Signed)
Received request for chart notes on patient's testing frequency, insulin injection frequency and office visits for diabetes from the company that is supplying her Continuous glucose monitoring system. Called patient and asked her to call the office to schedule a visit with Dr. Eileen Stanford and myself on the same day.

## 2018-04-11 DIAGNOSIS — G609 Hereditary and idiopathic neuropathy, unspecified: Secondary | ICD-10-CM | POA: Diagnosis not present

## 2018-04-12 ENCOUNTER — Encounter: Payer: Self-pay | Admitting: Dietician

## 2018-04-12 ENCOUNTER — Ambulatory Visit: Payer: Medicare HMO | Admitting: Dietician

## 2018-04-12 DIAGNOSIS — E1140 Type 2 diabetes mellitus with diabetic neuropathy, unspecified: Secondary | ICD-10-CM

## 2018-04-12 DIAGNOSIS — E1165 Type 2 diabetes mellitus with hyperglycemia: Secondary | ICD-10-CM

## 2018-04-12 DIAGNOSIS — IMO0002 Reserved for concepts with insufficient information to code with codable children: Secondary | ICD-10-CM

## 2018-04-12 DIAGNOSIS — G609 Hereditary and idiopathic neuropathy, unspecified: Secondary | ICD-10-CM | POA: Diagnosis not present

## 2018-04-12 NOTE — Progress Notes (Signed)
Diabetes Group meeting Documentation:  start time:10:15 AM   end time: [place] attended diabetes education group visit today for 60 minutes. The program included successes and challenges over the past few weeks. We discussed healthy food choices for diabetes, diabetes medicines acute and long term complications. . Our activity was Diabingo.  Blood sugar:  Marcene Brawn download of her CGM for 14 days.  74% time worn.  The average reading was 175, % time in target was 53, % time below target was 3, and % time above target was. 44. Intervention will be to Suggest adjusting mealtime insulin to 8 units with breakfast, 6 units with lunch, 12 units with dinner. Ms Lajuan Lines agrees to this change and is willing to do it with Dr. Nelia Shi okay. She is showing much improvemoent and better control of her diabetes. Her last A1C was ~ an average of 240mg /dl.    Lab Results  Component Value Date   HGBA1C 10.1 (A) 02/21/2018   The goal she/he is working on NO:BSJGGE appointments , improving blood sugars.  Her goal for the next few weeks is scanning with cgm at  Plan: attend diabetes group meeting next month Suggest adjusting mealtime insulin to 8 units with breakfast, 6 units with lunch, 12 units with dinner. Ms Lajuan Lines agrees to this change and is willing to do it with Dr. Nelia Shi okay. She reports she has been dismissed from our practice in 30 days. She is to follow up in 2-4 weeks and may attend class next month.  Debera Lat, RD 04/13/2018 8:54 AM. bits

## 2018-04-13 DIAGNOSIS — G609 Hereditary and idiopathic neuropathy, unspecified: Secondary | ICD-10-CM | POA: Diagnosis not present

## 2018-04-14 DIAGNOSIS — G609 Hereditary and idiopathic neuropathy, unspecified: Secondary | ICD-10-CM | POA: Diagnosis not present

## 2018-04-15 DIAGNOSIS — G609 Hereditary and idiopathic neuropathy, unspecified: Secondary | ICD-10-CM | POA: Diagnosis not present

## 2018-04-16 ENCOUNTER — Telehealth: Payer: Self-pay | Admitting: Dietician

## 2018-04-16 DIAGNOSIS — G609 Hereditary and idiopathic neuropathy, unspecified: Secondary | ICD-10-CM | POA: Diagnosis not present

## 2018-04-16 NOTE — Telephone Encounter (Signed)
Called ms Shoun per Dr. Nelia Shi okay to tell her to adjust her meal tine insulin doses to 8 units with breakfast, 6 units with lunch and 10 units with dinner due to lows midday and hyperglycemia before bedtime. She verbalized understanding, repeating back the correct insulin doses. I encouraged her to continue scanning with her CGM and make an appointment with me at the end of this week or the beginning of next week. She wanted to call back to make that appointment.

## 2018-04-17 DIAGNOSIS — G609 Hereditary and idiopathic neuropathy, unspecified: Secondary | ICD-10-CM | POA: Diagnosis not present

## 2018-04-18 DIAGNOSIS — G609 Hereditary and idiopathic neuropathy, unspecified: Secondary | ICD-10-CM | POA: Diagnosis not present

## 2018-04-19 DIAGNOSIS — G609 Hereditary and idiopathic neuropathy, unspecified: Secondary | ICD-10-CM | POA: Diagnosis not present

## 2018-04-20 DIAGNOSIS — G609 Hereditary and idiopathic neuropathy, unspecified: Secondary | ICD-10-CM | POA: Diagnosis not present

## 2018-04-21 DIAGNOSIS — G609 Hereditary and idiopathic neuropathy, unspecified: Secondary | ICD-10-CM | POA: Diagnosis not present

## 2018-04-22 DIAGNOSIS — G609 Hereditary and idiopathic neuropathy, unspecified: Secondary | ICD-10-CM | POA: Diagnosis not present

## 2018-04-23 DIAGNOSIS — G609 Hereditary and idiopathic neuropathy, unspecified: Secondary | ICD-10-CM | POA: Diagnosis not present

## 2018-04-23 DIAGNOSIS — S91109A Unspecified open wound of unspecified toe(s) without damage to nail, initial encounter: Secondary | ICD-10-CM | POA: Diagnosis not present

## 2018-04-23 DIAGNOSIS — M2042 Other hammer toe(s) (acquired), left foot: Secondary | ICD-10-CM | POA: Diagnosis not present

## 2018-04-23 DIAGNOSIS — L03032 Cellulitis of left toe: Secondary | ICD-10-CM | POA: Diagnosis not present

## 2018-04-24 DIAGNOSIS — G609 Hereditary and idiopathic neuropathy, unspecified: Secondary | ICD-10-CM | POA: Diagnosis not present

## 2018-04-25 ENCOUNTER — Other Ambulatory Visit: Payer: Self-pay

## 2018-04-25 DIAGNOSIS — G609 Hereditary and idiopathic neuropathy, unspecified: Secondary | ICD-10-CM | POA: Diagnosis not present

## 2018-04-25 DIAGNOSIS — Z79891 Long term (current) use of opiate analgesic: Secondary | ICD-10-CM

## 2018-04-25 NOTE — Telephone Encounter (Signed)
oxyCODONE-acetaminophen (PERCOCET/ROXICET) 5-325 MG tablet, refill request @  [facility], [place] AT [place] [phone] (Phone) [phone] (Fax)

## 2018-04-26 ENCOUNTER — Telehealth: Payer: Self-pay | Admitting: Dietician

## 2018-04-26 DIAGNOSIS — G609 Hereditary and idiopathic neuropathy, unspecified: Secondary | ICD-10-CM | POA: Diagnosis not present

## 2018-04-26 NOTE — Telephone Encounter (Signed)
Needs refill on oxyCODONE-acetaminophen (PERCOCET/ROXICET) 5-325 MG tablet at [facility]; pt contact [phone]

## 2018-04-26 NOTE — Telephone Encounter (Signed)
Ms. Terwilliger called asking if I would be sure we had hr new address and that [facility] did also. Note to front desk to update address, I called [facility] and they already had her new address.

## 2018-04-27 MED ORDER — OXYCODONE-ACETAMINOPHEN 5-325 MG PO TABS: 1.0000 | ORAL_TABLET | Freq: Three times a day (TID) | ORAL | 0 refills | Status: DC | PRN

## 2018-04-27 NOTE — Telephone Encounter (Signed)
Pt returning call about her Pain meds

## 2018-04-29 ENCOUNTER — Other Ambulatory Visit: Payer: Self-pay | Admitting: Internal Medicine

## 2018-05-05 ENCOUNTER — Other Ambulatory Visit: Payer: Self-pay | Admitting: Internal Medicine

## 2018-05-05 DIAGNOSIS — M542 Cervicalgia: Secondary | ICD-10-CM

## 2018-05-09 ENCOUNTER — Other Ambulatory Visit: Payer: Self-pay | Admitting: Internal Medicine

## 2018-05-09 DIAGNOSIS — I10 Essential (primary) hypertension: Secondary | ICD-10-CM

## 2018-05-09 DIAGNOSIS — I2510 Atherosclerotic heart disease of native coronary artery without angina pectoris: Secondary | ICD-10-CM

## 2018-05-11 ENCOUNTER — Telehealth: Payer: Self-pay | Admitting: Internal Medicine

## 2018-05-11 NOTE — Telephone Encounter (Signed)
Patient cited work and scheduling difficulties that's why she has been unable to follow up with PT

## 2018-05-11 NOTE — Telephone Encounter (Signed)
-----   Message from Aldine Contes, MD sent at 03/09/2018  8:10 AM EDT ----- Regarding: FW: Cancellation of Order # [id number] Hello Obed, Your patient no showed for her PT appointment. Please follow up with her and refer her again if she is agreeable and it is appropriate.  Thanks, Dr. Dareen Piano   ----- Message ----- From: Hulan Fray, RN Sent: 03/08/2018   6:18 PM EDT To: Aldine Contes, MD Subject: Cancellation of Order # [id number]              Order number [id number] for the procedure AMB REFERRAL TO PHYSICAL THERAPY [REF87] has been canceled by Hulan Fray, RN [[id number]].  This procedure was ordered by Aldine Contes, MD  [[id number]] on Feb 21, 2018 for the patient [name]  [[id number]]. The reason for cancellation was "No Show".

## 2018-05-18 ENCOUNTER — Other Ambulatory Visit: Payer: Self-pay | Admitting: Internal Medicine

## 2018-05-18 DIAGNOSIS — R05 Cough: Secondary | ICD-10-CM

## 2018-05-18 DIAGNOSIS — R059 Cough, unspecified: Secondary | ICD-10-CM

## 2018-05-18 NOTE — Telephone Encounter (Signed)
It is past the 30 days from receipt of her dismissal letter. She needs a new PCP or can get it OTC.

## 2018-05-21 ENCOUNTER — Encounter (HOSPITAL_COMMUNITY): Payer: Self-pay | Admitting: Emergency Medicine

## 2018-05-21 ENCOUNTER — Inpatient Hospital Stay (HOSPITAL_COMMUNITY)
Admission: EM | Admit: 2018-05-21 | Discharge: 2018-05-24 | DRG: 280 | Disposition: A | Discharge status: Home Health | Payer: Medicare HMO | Attending: Internal Medicine | Admitting: Internal Medicine

## 2018-05-21 ENCOUNTER — Emergency Department (HOSPITAL_COMMUNITY): Payer: Medicare HMO

## 2018-05-21 ENCOUNTER — Other Ambulatory Visit: Payer: Self-pay

## 2018-05-21 DIAGNOSIS — N179 Acute kidney failure, unspecified: Secondary | ICD-10-CM | POA: Diagnosis present

## 2018-05-21 DIAGNOSIS — E1165 Type 2 diabetes mellitus with hyperglycemia: Secondary | ICD-10-CM | POA: Diagnosis present

## 2018-05-21 DIAGNOSIS — E1142 Type 2 diabetes mellitus with diabetic polyneuropathy: Secondary | ICD-10-CM | POA: Diagnosis present

## 2018-05-21 DIAGNOSIS — N183 Chronic kidney disease, stage 3 (moderate): Secondary | ICD-10-CM | POA: Diagnosis present

## 2018-05-21 DIAGNOSIS — E785 Hyperlipidemia, unspecified: Secondary | ICD-10-CM | POA: Diagnosis present

## 2018-05-21 DIAGNOSIS — Z7982 Long term (current) use of aspirin: Secondary | ICD-10-CM | POA: Diagnosis not present

## 2018-05-21 DIAGNOSIS — N171 Acute kidney failure with acute cortical necrosis: Secondary | ICD-10-CM

## 2018-05-21 DIAGNOSIS — E11319 Type 2 diabetes mellitus with unspecified diabetic retinopathy without macular edema: Secondary | ICD-10-CM | POA: Diagnosis present

## 2018-05-21 DIAGNOSIS — R0602 Shortness of breath: Secondary | ICD-10-CM | POA: Diagnosis present

## 2018-05-21 DIAGNOSIS — Z794 Long term (current) use of insulin: Secondary | ICD-10-CM

## 2018-05-21 DIAGNOSIS — E1122 Type 2 diabetes mellitus with diabetic chronic kidney disease: Secondary | ICD-10-CM | POA: Diagnosis present

## 2018-05-21 DIAGNOSIS — I252 Old myocardial infarction: Secondary | ICD-10-CM

## 2018-05-21 DIAGNOSIS — K219 Gastro-esophageal reflux disease without esophagitis: Secondary | ICD-10-CM | POA: Diagnosis present

## 2018-05-21 DIAGNOSIS — I130 Hypertensive heart and chronic kidney disease with heart failure and stage 1 through stage 4 chronic kidney disease, or unspecified chronic kidney disease: Secondary | ICD-10-CM | POA: Diagnosis present

## 2018-05-21 DIAGNOSIS — I214 Non-ST elevation (NSTEMI) myocardial infarction: Secondary | ICD-10-CM | POA: Diagnosis present

## 2018-05-21 DIAGNOSIS — Z6841 Body Mass Index (BMI) 40.0 and over, adult: Secondary | ICD-10-CM | POA: Diagnosis not present

## 2018-05-21 DIAGNOSIS — Z87891 Personal history of nicotine dependence: Secondary | ICD-10-CM | POA: Diagnosis not present

## 2018-05-21 DIAGNOSIS — I5032 Chronic diastolic (congestive) heart failure: Secondary | ICD-10-CM | POA: Diagnosis not present

## 2018-05-21 DIAGNOSIS — Z6837 Body mass index (BMI) 37.0-37.9, adult: Secondary | ICD-10-CM

## 2018-05-21 DIAGNOSIS — H409 Unspecified glaucoma: Secondary | ICD-10-CM | POA: Diagnosis present

## 2018-05-21 DIAGNOSIS — R091 Pleurisy: Secondary | ICD-10-CM

## 2018-05-21 DIAGNOSIS — I5041 Acute combined systolic (congestive) and diastolic (congestive) heart failure: Secondary | ICD-10-CM | POA: Diagnosis present

## 2018-05-21 DIAGNOSIS — I2510 Atherosclerotic heart disease of native coronary artery without angina pectoris: Secondary | ICD-10-CM | POA: Diagnosis present

## 2018-05-21 DIAGNOSIS — Z9861 Coronary angioplasty status: Secondary | ICD-10-CM | POA: Diagnosis not present

## 2018-05-21 DIAGNOSIS — Z981 Arthrodesis status: Secondary | ICD-10-CM

## 2018-05-21 DIAGNOSIS — Z79899 Other long term (current) drug therapy: Secondary | ICD-10-CM | POA: Diagnosis not present

## 2018-05-21 DIAGNOSIS — I110 Hypertensive heart disease with heart failure: Secondary | ICD-10-CM | POA: Diagnosis not present

## 2018-05-21 DIAGNOSIS — I255 Ischemic cardiomyopathy: Secondary | ICD-10-CM | POA: Diagnosis present

## 2018-05-21 DIAGNOSIS — E118 Type 2 diabetes mellitus with unspecified complications: Secondary | ICD-10-CM | POA: Diagnosis not present

## 2018-05-21 LAB — CBC
HCT: 37.4 % (ref 36.0–46.0)
HEMOGLOBIN: 10.9 g/dL — AB (ref 12.0–15.0)
MCH: 26.9 pg (ref 26.0–34.0)
MCHC: 29.1 g/dL — ABNORMAL LOW (ref 30.0–36.0)
MCV: 92.3 fL (ref 80.0–100.0)
PLATELETS: 307 10*3/uL (ref 150–400)
RBC: 4.05 MIL/uL (ref 3.87–5.11)
RDW: 14.3 % (ref 11.5–15.5)
WBC: 14.7 10*3/uL — AB (ref 4.0–10.5)
nRBC: 0 % (ref 0.0–0.2)

## 2018-05-21 LAB — GLUCOSE, CAPILLARY: Glucose-Capillary: 360 mg/dL — ABNORMAL HIGH (ref 70–99)

## 2018-05-21 LAB — D-DIMER, QUANTITATIVE: D-Dimer, Quant: 1.38 ug/mL-FEU — ABNORMAL HIGH (ref 0.00–0.50)

## 2018-05-21 LAB — BASIC METABOLIC PANEL
ANION GAP: 9 (ref 5–15)
BUN: 40 mg/dL — AB (ref 6–20)
CALCIUM: 9.3 mg/dL (ref 8.9–10.3)
CO2: 24 mmol/L (ref 22–32)
Chloride: 103 mmol/L (ref 98–111)
Creatinine, Ser: 1.86 mg/dL — ABNORMAL HIGH (ref 0.44–1.00)
GFR calc Af Amer: 33 mL/min — ABNORMAL LOW (ref >60)
GFR, EST NON AFRICAN AMERICAN: 29 mL/min — AB (ref >60)
GLUCOSE: 216 mg/dL — AB (ref 70–99)
POTASSIUM: 5.1 mmol/L (ref 3.5–5.1)
SODIUM: 136 mmol/L (ref 135–145)

## 2018-05-21 LAB — CBG MONITORING, ED: GLUCOSE-CAPILLARY: 327 mg/dL — AB (ref 70–99)

## 2018-05-21 LAB — CBC WITH DIFFERENTIAL/PLATELET
Abs Immature Granulocytes: 0.1 10*3/uL — ABNORMAL HIGH (ref 0.00–0.07)
Basophils Absolute: 0 10*3/uL (ref 0.0–0.1)
Basophils Relative: 0 %
EOS ABS: 0.2 10*3/uL (ref 0.0–0.5)
EOS PCT: 2 %
HEMATOCRIT: 33.1 % — AB (ref 36.0–46.0)
HEMOGLOBIN: 9.8 g/dL — AB (ref 12.0–15.0)
Immature Granulocytes: 1 %
Lymphocytes Relative: 31 %
Lymphs Abs: 3.3 10*3/uL (ref 0.7–4.0)
MCH: 27.3 pg (ref 26.0–34.0)
MCHC: 29.6 g/dL — AB (ref 30.0–36.0)
MCV: 92.2 fL (ref 80.0–100.0)
MONO ABS: 0.6 10*3/uL (ref 0.1–1.0)
MONOS PCT: 5 %
NRBC: 0 % (ref 0.0–0.2)
Neutro Abs: 6.5 10*3/uL (ref 1.7–7.7)
Neutrophils Relative %: 61 %
Platelets: 259 10*3/uL (ref 150–400)
RBC: 3.59 MIL/uL — ABNORMAL LOW (ref 3.87–5.11)
RDW: 14.2 % (ref 11.5–15.5)
WBC: 10.7 10*3/uL — ABNORMAL HIGH (ref 4.0–10.5)

## 2018-05-21 LAB — LIPID PANEL
CHOLESTEROL: 156 mg/dL (ref 0–200)
HDL: 38 mg/dL — ABNORMAL LOW (ref >40)
LDL Cholesterol: 90 mg/dL (ref 0–99)
TRIGLYCERIDES: 141 mg/dL (ref <150)
Total CHOL/HDL Ratio: 4.1 RATIO
VLDL: 28 mg/dL (ref 0–40)

## 2018-05-21 LAB — I-STAT TROPONIN, ED: TROPONIN I, POC: 0.06 ng/mL (ref 0.00–0.08)

## 2018-05-21 LAB — PROCALCITONIN: Procalcitonin: <0.1 ng/mL

## 2018-05-21 LAB — TROPONIN I
TROPONIN I: 0.48 ng/mL — AB (ref <0.03)
TROPONIN I: 1.26 ng/mL — AB (ref <0.03)

## 2018-05-21 LAB — BRAIN NATRIURETIC PEPTIDE: B Natriuretic Peptide: 30.9 pg/mL (ref 0.0–100.0)

## 2018-05-21 MED ORDER — ASPIRIN 81 MG PO CHEW
81.0000 mg | CHEWABLE_TABLET | ORAL | Status: AC
  Administered 2018-05-22: 81 mg via ORAL
  Filled 2018-05-21: qty 1

## 2018-05-21 MED ORDER — ASPIRIN 81 MG PO TBEC: 81.0000 mg | DELAYED_RELEASE_TABLET | Freq: Every day | ORAL | Status: DC

## 2018-05-21 MED ORDER — NITROGLYCERIN 2 % TD OINT
1.0000 [in_us] | TOPICAL_OINTMENT | Freq: Four times a day (QID) | TRANSDERMAL | Status: DC
  Administered 2018-05-22: 1 [in_us] via TOPICAL
  Filled 2018-05-21: qty 30

## 2018-05-21 MED ORDER — PRAVASTATIN SODIUM 10 MG PO TABS: 20.0000 mg | ORAL_TABLET | Freq: Every evening | ORAL | Status: DC

## 2018-05-21 MED ORDER — HEPARIN BOLUS VIA INFUSION
4000.0000 [IU] | Freq: Once | INTRAVENOUS | Status: AC
  Administered 2018-05-21: 4000 [IU] via INTRAVENOUS
  Filled 2018-05-21: qty 4000

## 2018-05-21 MED ORDER — ACETAMINOPHEN 650 MG RE SUPP: 650.0000 mg | Freq: Four times a day (QID) | RECTAL | Status: DC | PRN

## 2018-05-21 MED ORDER — LEVOFLOXACIN IN D5W 500 MG/100ML IV SOLN
500.0000 mg | Freq: Once | INTRAVENOUS | Status: AC
  Administered 2018-05-21: 500 mg via INTRAVENOUS
  Filled 2018-05-21: qty 100

## 2018-05-21 MED ORDER — ASPIRIN 81 MG PO CHEW
324.0000 mg | CHEWABLE_TABLET | Freq: Once | ORAL | Status: AC
  Administered 2018-05-21: 324 mg via ORAL
  Filled 2018-05-21: qty 4

## 2018-05-21 MED ORDER — INSULIN ASPART 100 UNIT/ML ~~LOC~~ SOLN
0.0000 [IU] | Freq: Every day | SUBCUTANEOUS | Status: DC
  Administered 2018-05-22: 2 [IU] via SUBCUTANEOUS
  Administered 2018-05-22: 5 [IU] via SUBCUTANEOUS

## 2018-05-21 MED ORDER — SODIUM CHLORIDE 0.9 % IV SOLN: 250.0000 mL | INTRAVENOUS | Status: DC | PRN

## 2018-05-21 MED ORDER — HEPARIN (PORCINE) 25000 UT/250ML-% IV SOLN
1200.0000 [IU]/h | INTRAVENOUS | Status: DC
  Administered 2018-05-21: 1200 [IU]/h via INTRAVENOUS
  Filled 2018-05-21: qty 250

## 2018-05-21 MED ORDER — ACETAMINOPHEN 325 MG PO TABS: 650.0000 mg | ORAL_TABLET | Freq: Four times a day (QID) | ORAL | Status: DC | PRN

## 2018-05-21 MED ORDER — FENTANYL CITRATE (PF) 100 MCG/2ML IJ SOLN
50.0000 ug | Freq: Once | INTRAMUSCULAR | Status: AC
  Administered 2018-05-21: 50 ug via INTRAVENOUS
  Filled 2018-05-21: qty 2

## 2018-05-21 MED ORDER — SODIUM CHLORIDE 0.9 % IV SOLN
1.0000 g | INTRAVENOUS | Status: DC
  Filled 2018-05-21: qty 10

## 2018-05-21 MED ORDER — SODIUM CHLORIDE 0.9 % IV SOLN
INTRAVENOUS | Status: DC
  Administered 2018-05-22: 01:00:00 via INTRAVENOUS

## 2018-05-21 MED ORDER — ASPIRIN EC 81 MG PO TBEC
81.0000 mg | DELAYED_RELEASE_TABLET | Freq: Every day | ORAL | Status: DC
  Administered 2018-05-23 – 2018-05-24 (×2): 81 mg via ORAL
  Filled 2018-05-21 (×3): qty 1

## 2018-05-21 MED ORDER — AMITRIPTYLINE HCL 25 MG PO TABS
25.0000 mg | ORAL_TABLET | Freq: Every day | ORAL | Status: DC
  Administered 2018-05-22 – 2018-05-23 (×3): 25 mg via ORAL
  Filled 2018-05-21 (×3): qty 1

## 2018-05-21 MED ORDER — IOPAMIDOL (ISOVUE-370) INJECTION 76%
INTRAVENOUS | Status: AC
  Administered 2018-05-21: 100 mL
  Filled 2018-05-21: qty 100

## 2018-05-21 MED ORDER — CARVEDILOL 6.25 MG PO TABS
6.2500 mg | ORAL_TABLET | Freq: Two times a day (BID) | ORAL | Status: DC
  Administered 2018-05-21 – 2018-05-23 (×5): 6.25 mg via ORAL
  Filled 2018-05-21 (×5): qty 1

## 2018-05-21 MED ORDER — AZITHROMYCIN 250 MG PO TABS: 500.0000 mg | ORAL_TABLET | Freq: Once | ORAL | Status: DC

## 2018-05-21 MED ORDER — INSULIN ASPART 100 UNIT/ML ~~LOC~~ SOLN
0.0000 [IU] | Freq: Three times a day (TID) | SUBCUTANEOUS | Status: DC
  Administered 2018-05-21: 11 [IU] via SUBCUTANEOUS
  Administered 2018-05-22 – 2018-05-23 (×5): 8 [IU] via SUBCUTANEOUS
  Administered 2018-05-24: 5 [IU] via SUBCUTANEOUS

## 2018-05-21 MED ORDER — SENNOSIDES-DOCUSATE SODIUM 8.6-50 MG PO TABS: 1.0000 | ORAL_TABLET | Freq: Every evening | ORAL | Status: DC | PRN

## 2018-05-21 MED ORDER — ENOXAPARIN SODIUM 40 MG/0.4ML ~~LOC~~ SOLN: 40.0000 mg | SUBCUTANEOUS | Status: DC

## 2018-05-21 MED ORDER — FENTANYL CITRATE (PF) 100 MCG/2ML IJ SOLN
100.0000 ug | Freq: Once | INTRAMUSCULAR | Status: AC
  Administered 2018-05-21: 100 ug via INTRAVENOUS
  Filled 2018-05-21: qty 2

## 2018-05-21 MED ORDER — INSULIN ASPART 100 UNIT/ML ~~LOC~~ SOLN
4.0000 [IU] | Freq: Three times a day (TID) | SUBCUTANEOUS | Status: DC
  Administered 2018-05-22: 4 [IU] via SUBCUTANEOUS

## 2018-05-21 MED ORDER — FUROSEMIDE 20 MG PO TABS
20.0000 mg | ORAL_TABLET | Freq: Every day | ORAL | Status: DC
  Administered 2018-05-21 – 2018-05-24 (×3): 20 mg via ORAL
  Filled 2018-05-21 (×4): qty 1

## 2018-05-21 MED ORDER — GABAPENTIN 300 MG PO CAPS
300.0000 mg | ORAL_CAPSULE | Freq: Three times a day (TID) | ORAL | Status: DC
  Administered 2018-05-21 – 2018-05-24 (×8): 300 mg via ORAL
  Filled 2018-05-21 (×9): qty 1

## 2018-05-21 MED ORDER — SODIUM CHLORIDE 0.9 % IV BOLUS (SEPSIS)
1000.0000 mL | Freq: Once | INTRAVENOUS | Status: AC
  Administered 2018-05-21: 1000 mL via INTRAVENOUS

## 2018-05-21 MED ORDER — OXYCODONE-ACETAMINOPHEN 5-325 MG PO TABS
1.0000 | ORAL_TABLET | Freq: Three times a day (TID) | ORAL | Status: DC | PRN
  Administered 2018-05-21 – 2018-05-22 (×2): 1 via ORAL
  Filled 2018-05-21 (×2): qty 1

## 2018-05-21 MED ORDER — SODIUM CHLORIDE 0.9% FLUSH: 3.0000 mL | INTRAVENOUS | Status: DC | PRN

## 2018-05-21 MED ORDER — INSULIN GLARGINE 100 UNIT/ML ~~LOC~~ SOLN
30.0000 [IU] | Freq: Every day | SUBCUTANEOUS | Status: DC
  Administered 2018-05-22 (×2): 30 [IU] via SUBCUTANEOUS
  Filled 2018-05-21 (×3): qty 0.3

## 2018-05-21 MED ORDER — SODIUM CHLORIDE 0.9% FLUSH
3.0000 mL | Freq: Two times a day (BID) | INTRAVENOUS | Status: DC
  Administered 2018-05-22: 3 mL via INTRAVENOUS

## 2018-05-21 NOTE — H&P (View-Only) (Signed)
Cardiology Consultation:   Patient ID: [name] MRN: [id number]; DOB: 12-Feb-1961  Admit date: 05/21/2018 Date of Consult: 05/21/2018  Primary Care Provider: Tsosie Billing, MD Primary Cardiologist: Sherren Mocha, MD  Primary Electrophysiologist:  None    Patient Profile:   [name] is a 57 y.o. female with a hx of NICM and non obstructive CAD who is being seen today for the evaluation of chest pain at the request of Dr. Dareen Piano. .  History of Present Illness:   [name] with CAD -NICM, nonobstructive on last cath 2013 with 50% stenosis in LAD, within a myocardial bridge, EF 40-45%, last echo 01/2017 with EF 50-55%, G1 DD, essentially normal study.  Other hx of DM-2 on insulin, GERD, HTN and hyperlipidemia.  Pt now presents with chest pain  Over last 2-3 days.  She notes over last couple of months she has not been her usual with DOE that has continued to progress.  Now with sharp chest pain, midsternal to Lt ant chest.  + nausea and dizziness and DOE.     EKG I personally reviewed sT with low voltage ant leads  Troponin 0.48; 1.26  Na 136, K+ 5.1, Cr 1.86, BNP 30.9 WBC 10.7, Hgb 9.8 and HCt 33.1  DDimer elevated at 1.38 with CTA of chest  neg for PE, 1.3 x 1.1 cm nodular opacity abutting the pleura in the inferior lingula.  Single mildly prominent subcarinal lymph node of uncertain etiology. No other adenopathy evident. CXR No active cardiopulmonary disease  Still with occ pain.      Past Medical History:  Diagnosis Date  . Adhesive capsulitis of left shoulder    Steroid injection Dr. Truman Hayward 1/12  . Adhesive capsulitis of right shoulder    Steroid injection Dr. Truman Hayward 1/12  . Adhesive capsulitis of shoulder    bilateral, Steroid injection Dr. Truman Hayward 1/12 bilaterally  . CAD (coronary artery disease)    nonobstructive. Last cardiac cath (2008) showing left circumflex with mid 50% stenosis and distal luminal irregularities. Also with RCA with mid to distal  30-40% stenosis. // Previously evaluated by [facility], never followed up outpatient.  Marland Kitchen CAP (community acquired pneumonia) 06/15/2012   05/2012 CXR: Mild opacification of the posterior lung base on the lateral film  as cannot exclude infection/atelectasis.    . CHF (congestive heart failure) ([place])   . Diabetes mellitus 2007   Type II, insulin dependent  . Diabetic retinopathy   . GERD (gastroesophageal reflux disease)   . Glaucoma   . Hyperlipidemia   . Hypertension   . Obesity   . Peripheral neuropathy     Past Surgical History:  Procedure Laterality Date  . ANTERIOR CERVICAL DECOMP/DISCECTOMY FUSION N/A 06/16/2016   Procedure: Anterior Cervical Decompression/discectomy Fusion - Cervical six - Cervical seven;  Surgeon: Eustace Moore, MD;  Location: [place];  Service: Neurosurgery;  Laterality: N/A;  Anterior Cervical Decompression/discectomy Fusion - Cervical six - Cervical seven  . APPENDECTOMY    . CARDIAC CATHETERIZATION    . CATARACT EXTRACTION    . GLAUCOMA SURGERY    . LEFT HEART CATHETERIZATION WITH CORONARY ANGIOGRAM N/A 01/24/2012   Procedure: LEFT HEART CATHETERIZATION WITH CORONARY ANGIOGRAM;  Surgeon: Sherren Mocha, MD;  Location: [facility] CATH LAB;  Service: Cardiovascular;  Laterality: N/A;  . POSTERIOR CERVICAL FUSION/FORAMINOTOMY N/A 10/22/2015   Procedure: Cervical three-Cervical Seven Posterior cervical fusion with lateral mass fixation,  Laminectomy Cervical Three-Cervical Seven;  Surgeon: Eustace Moore, MD;  Location: [place]  ORS;  Service: Neurosurgery;  Laterality: N/A;  Posterior  . TUBAL LIGATION       Home Medications:  Prior to Admission medications   Medication Sig Start Date End Date Taking? Authorizing Provider  amitriptyline (ELAVIL) 25 MG tablet Take 1 tablet (25 mg total) by mouth at bedtime. 01/10/18  Yes Axel Filler, MD  aspirin EC 81 MG EC tablet Take 1 tablet (81 mg total) by mouth daily. 05/01/15  Yes Strader, [place], PA-C    carvedilol (COREG) 6.25 MG tablet TAKE 1 TABLET BY MOUTH TWICE DAILY Patient taking differently: Take 6.25 mg by mouth 2 (two) times daily with a meal. Take 1 tablet by mouth twice daily. 05/11/18  Yes Agyei, Obed Raliegh Ip, MD  COMBIGAN 0.2-0.5 % ophthalmic solution Place 1 drop into both eyes 2 (two) times daily. 03/22/18  Yes [provider]  cyclobenzaprine (FLEXERIL) 10 MG tablet TAKE 1 TABLET(10 MG) BY MOUTH TWICE DAILY AS NEEDED FOR MUSCLE SPASMS Patient taking differently: Take 10 mg by mouth as needed.  05/07/18  Yes Agyei, Caprice Kluver, MD  diclofenac sodium (VOLTAREN) 1 % GEL Apply 2 g topically 3 (three) times daily as needed. 01/23/18  Yes Dorrell, Andree Elk, MD  dorzolamide (TRUSOPT) 2 % ophthalmic solution Place 1 drop into both eyes 2 (two) times daily. 03/22/18  Yes [provider]  fluticasone (FLONASE) 50 MCG/ACT nasal spray Place 1 spray into both nostrils daily. Patient taking differently: Place 1 spray into both nostrils as needed.  11/10/17 11/10/18 Yes Rice, Resa Miner, MD  furosemide (LASIX) 20 MG tablet Take 1 tablet (20 mg total) by mouth daily. 03/08/18  Yes Agyei, Caprice Kluver, MD  gabapentin (NEURONTIN) 300 MG capsule Take 1 capsule (300 mg total) by mouth 3 (three) times daily. 01/10/18  Yes Axel Filler, MD  IRON PO Take 1 tablet by mouth 3 (three) times daily.   Yes [provider]  lisinopril (PRINIVIL,ZESTRIL) 20 MG tablet TAKE 1 TABLET(20 MG) BY MOUTH DAILY Patient taking differently: Take 10 mg by mouth daily.  12/11/17  Yes Sid Falcon, MD  metFORMIN (GLUCOPHAGE) 1000 MG tablet TAKE 1 TABLET BY MOUTH TWICE DAILY WITH A MEAL Patient taking differently: Take 1,000 mg by mouth 2 (two) times daily with a meal.  05/01/18  Yes Agyei, Caprice Kluver, MD  mupirocin ointment (BACTROBAN) 2 % Apply 1 application topically daily. 05/10/18  Yes [provider]  NOVOLOG FLEXPEN 100 UNIT/ML FlexPen INJECT 12 UNITS UNDER THE SKIN THREE TIMES DAILY WITH  MEALS Patient taking differently: Inject 8-10 Units into the skin 3 (three) times daily with meals. Sliding scale 04/12/18  Yes Oda Kilts, MD  oxyCODONE-acetaminophen (PERCOCET/ROXICET) 5-325 MG tablet Take 1 tablet by mouth every 8 (eight) hours as needed for severe pain. 04/27/18  Yes Aldine Contes, MD  pantoprazole (PROTONIX) 40 MG tablet Take 1 tablet (40 mg total) by mouth daily. 03/08/18  Yes Agyei, Caprice Kluver, MD  polyethylene glycol (MIRALAX / GLYCOLAX) packet Take 17 g by mouth daily. Patient taking differently: Take 17 g by mouth daily as needed.  09/23/16  Yes Domenic Moras, PA-C  pravastatin (PRAVACHOL) 20 MG tablet TAKE 1 TABLET BY MOUTH EVERY EVENING Patient taking differently: Take 20 mg by mouth at bedtime.  05/11/18  Yes Jean Rosenthal, MD  senna (SENOKOT) 8.6 MG TABS tablet Take 2 tablets (17.2 mg total) by mouth daily. Patient taking differently: Take 2 tablets by mouth as needed.  09/30/16  Yes Rice,  Resa Miner, MD  TRESIBA FLEXTOUCH 200 UNIT/ML SOPN INJECT 30 UNITS INTO THE SKIN DAILY Patient taking differently: Inject 33 Units into the skin daily.  04/06/18  Yes Agyei, Obed Raliegh Ip, MD  VICTOZA 18 MG/3ML SOPN INJECT 1.2 MG TOTAL INTO THE SKIN DAILY Patient taking differently: Inject 0.1 mg into the skin daily.  12/13/17  Yes Annia Belt, MD  Continuous Blood Gluc Receiver (FREESTYLE LIBRE 14 DAY READER) DEVI 1 each by Does not apply route 4 (four) times daily. 12/26/17   Lucious Groves, DO  Continuous Blood Gluc Sensor (FREESTYLE LIBRE 14 DAY SENSOR) MISC 1 each by Does not apply route QID. 12/26/17   Lucious Groves, DO  doxycycline (VIBRAMYCIN) 100 MG capsule Take 1 capsule (100 mg total) by mouth 2 (two) times daily. Patient not taking: Reported on 05/21/2018 12/09/17   Ward, Ozella Almond, PA-C  glucose blood (ONETOUCH VERIO) test strip USE TO CHECK BLOOD SUGAR 4 TIMES DAILY AS DIRECTED 01/30/18   Axel Filler, MD  Insulin Pen Needle 32G X 4 MM MISC Use to  inject insulin up to 4 times daily 01/29/18   Jean Rosenthal, MD  [name] DELICA LANCETS FINE MISC Check blood sugar as instructed up to 3 times a day 03/14/16   Collier Salina, MD  oxyCODONE-acetaminophen (PERCOCET/ROXICET) 5-325 MG tablet Take 1 tablet by mouth 3 (three) times daily as needed for severe pain. Patient not taking: Reported on 05/21/2018 04/27/18   Aldine Contes, MD  oxyCODONE-acetaminophen (PERCOCET/ROXICET) 5-325 MG tablet Take 1 tablet by mouth every 8 (eight) hours as needed for severe pain. Patient not taking: Reported on 05/21/2018 04/27/18   Aldine Contes, MD    Inpatient Medications: Scheduled Meds: . amitriptyline  25 mg Oral QHS  . [START ON 05/22/2018] aspirin EC  81 mg Oral Daily  . [START ON 05/22/2018] azithromycin  500 mg Oral Once  . carvedilol  6.25 mg Oral BID  . furosemide  20 mg Oral Daily  . gabapentin  300 mg Oral TID  . heparin  4,000 Units Intravenous Once  . insulin aspart  0-15 Units Subcutaneous TID WC  . insulin aspart  0-5 Units Subcutaneous QHS  . insulin aspart  4 Units Subcutaneous TID WC  . insulin glargine  30 Units Subcutaneous QHS  . pravastatin  20 mg Oral QPM   Continuous Infusions: . [START ON 05/22/2018] cefTRIAXone (ROCEPHIN)  IV    . heparin     PRN Meds: acetaminophen **OR** acetaminophen, oxyCODONE-acetaminophen, senna-docusate  Allergies:   No Known Allergies  Social History:   Social History   Socioeconomic History  . Marital status: Single    Spouse name: Not on file  . Number of children: 2  . Years of education: 12th grade  . Highest education level: Not on file  Occupational History  . Occupation: Development worker, community: UNEMPLOYED    Comment: used to work in [employer] fast streamer at [employer]). Got disability in 2007.   Social Needs  . Financial resource strain: Not on file  . Food insecurity:    Worry: Not on file    Inability: Not on file  . Transportation needs:    Medical: Not  on file    Non-medical: Not on file  Tobacco Use  . Smoking status: Former Smoker    Packs/day: 0.30    Years: 7.00    Pack years: 2.10    Types: Cigarettes    Last attempt to quit:  06/27/2006    Years since quitting: 11.9  . Smokeless tobacco: Never Used  Substance and Sexual Activity  . Alcohol use: No    Alcohol/week: 0.0 standard drinks  . Drug use: No  . Sexual activity: Not Currently  Lifestyle  . Physical activity:    Days per week: Not on file    Minutes per session: Not on file  . Stress: Not on file  Relationships  . Social connections:    Talks on phone: Not on file    Gets together: Not on file    Attends religious service: Not on file    Active member of club or organization: Not on file    Attends meetings of clubs or organizations: Not on file    Relationship status: Not on file  . Intimate partner violence:    Fear of current or ex partner: Not on file    Emotionally abused: Not on file    Physically abused: Not on file    Forced sexual activity: Not on file  Other Topics Concern  . Not on file  Social History Narrative   Lives with her kids, 37yo daughter and 29 yo adopted son.    Family History:    Family History  Problem Relation Age of Onset  . Heart disease Mother   . Hypertension Sister   . Hypertension Sister   . Hypertension Sister   . Heart attack Neg Hx   . Stroke Neg Hx      ROS:  Please see the history of present illness.  General:no colds or fevers, no weight changes Skin:no rashes or ulcers HEENT:no blurred vision, no congestion CV:see HPI PUL:see HPI GI:no diarrhea constipation or melena, no indigestion GU:no hematuria, no dysuria MS:no joint pain, no claudication Neuro:no syncope, + lightheadedness Endo:+ diabetes not well controlled, no thyroid disease  All other ROS reviewed and negative.     Physical Exam/Data:   Vitals:   05/21/18 0530 05/21/18 0545 05/21/18 0745 05/21/18 1030  BP:  121/64 (!) 102/50 118/70   Pulse: (!) 106 (!) 105 100 100  Resp: 13 13 (!) 26 16  Temp:      TempSrc:      SpO2: 99% 100% 100% 100%  Weight:      Height:       No intake or output data in the 24 hours ending 05/21/18 2020 Filed Weights   05/21/18 0514  Weight: 104.3 kg   Body mass index is 37.12 kg/m.  General:  Well nourished, well developed, in no acute distress HEENT: normal Lymph: no adenopathy Neck: no JVD Endocrine:  No thryomegaly Vascular: No carotid bruits; pedal pulses 2+ bilaterally   Cardiac:  normal S1, S2; RRR; no murmur gallup rub or click Lungs:  clear to auscultation bilaterally, no wheezing, rhonchi or rales  Abd: soft, nontender, no hepatomegaly  Ext: no edema Musculoskeletal:  No deformities, BUE and BLE strength normal and equal Skin: warm and dry  Neuro:  CNs 2-12 intact, no focal abnormalities noted Psych:  Normal affect     Relevant CV Studies: Cardiac cath 2013   Angiography   Left Main:  Smooth and normal  Left anterior Descending: Proximal vessel is normal.  There is a mid stenosis of 50% that appears to be within a myocardial bridge. The distal LAD is fairly normal.  The 1st diagonal is normal  Left Circumflex:  The left circumflex is a large vessel.  It gives off a large 1st OM which is  normal.  The distal LCx is normal  Right Coronary Artery: moderate in size and is normal.  The PDA is small but is normal  LV Gram: moderate global hypokinesia with EF of 40-45%.   Complications: No apparent complications Patient did tolerate procedure well.  Conclusions:   1. Minor coronary irregularities.  There is a 50% stenosis in the mid LAD that may be associated with myocardial bridging.  It does not appear to obstruct flow. 2. Moderate LV dysfunction with EF of 40-45%   Echo 01/2017 Study Conclusions  - Left ventricle: The cavity size was normal. Systolic function was   normal. The estimated ejection fraction was in the range of 50%   to 55%. Wall motion  was normal; there were no regional wall   motion abnormalities. Doppler parameters are consistent with   abnormal left ventricular relaxation (grade 1 diastolic   dysfunction). Doppler parameters are consistent with high   ventricular filling pressure. - Aortic valve: Transvalvular velocity was within the normal range.   There was no stenosis. There was no regurgitation. - Mitral valve: Transvalvular velocity was within the normal range.   There was no evidence for stenosis. There was no regurgitation. - Right ventricle: Systolic function was normal. - Tricuspid valve: There was no regurgitation.   Laboratory Data:  Chemistry Recent Labs  Lab 05/21/18 0515  NA 136  K 5.1  CL 103  CO2 24  GLUCOSE 216*  BUN 40*  CREATININE 1.86*  CALCIUM 9.3  GFRNONAA 29*  GFRAA 33*  ANIONGAP 9    No results for input(s): PROT, ALBUMIN, AST, ALT, ALKPHOS, BILITOT in the last 168 hours. Hematology Recent Labs  Lab 05/21/18 0515 05/21/18 1439  WBC 14.7* 10.7*  RBC 4.05 3.59*  HGB 10.9* 9.8*  HCT 37.4 33.1*  MCV 92.3 92.2  MCH 26.9 27.3  MCHC 29.1* 29.6*  RDW 14.3 14.2  PLT 307 259   Cardiac Enzymes Recent Labs  Lab 05/21/18 1439 05/21/18 1800  TROPONINI 0.48* 1.26*    Recent Labs  Lab 05/21/18 0523  TROPIPOC 0.06    BNP Recent Labs  Lab 05/21/18 0515  BNP 30.9    DDimer  Recent Labs  Lab 05/21/18 0515  DDIMER 1.38*    Radiology/Studies:  Dg Chest 2 View  Result Date: 05/21/2018 CLINICAL DATA:  Chest pain EXAM: CHEST - 2 VIEW COMPARISON:  12/09/2017 FINDINGS: The heart size and mediastinal contours are within normal limits. Both lungs are clear. The visualized skeletal structures are unremarkable. IMPRESSION: No active cardiopulmonary disease. Electronically Signed   By: Ulyses Jarred M.D.   On: 05/21/2018 06:09   Ct Angio Chest Pe W And/or Wo Contrast  Result Date: 05/21/2018 CLINICAL DATA:  Chest pain and shortness of breath EXAM: CT ANGIOGRAPHY CHEST  WITH CONTRAST TECHNIQUE: Multidetector CT imaging of the chest was performed using the standard protocol during bolus administration of intravenous contrast. Multiplanar CT image reconstructions and MIPs were obtained to evaluate the vascular anatomy. CONTRAST:  173mL ISOVUE-370 IOPAMIDOL (ISOVUE-370) INJECTION 76% COMPARISON:  Chest radiograph May 21, 2018 FINDINGS: Cardiovascular: There is no demonstrable pulmonary embolus. There is no thoracic aortic aneurysm or dissection. The visualized great vessels appear unremarkable except for calcification in the proximal left internal carotid artery. There is no pericardial effusion or pericardial thickening. Mediastinum/Nodes: Visualized thyroid appears normal. There are occasional subcentimeter mediastinal lymph nodes. There is a subcarinal lymph node measuring 1.3 x 1.2 cm. No other lymph node prominence evident. No esophageal lesions are appreciable.  Lungs/Pleura: There is patchy atelectasis in both lower lung zones. There is no frank edema or consolidation. On axial slice 74 series 7, there is a 1.3 x 1.1 cm opacity abutting the pleura in the inferior lingular region. No similar changes are noted elsewhere. No pleural effusion evident. Upper Abdomen: Visualized upper abdominal structures appear unremarkable. Musculoskeletal: There is degenerative change in the lower thoracic spine. There are no blastic or lytic bone lesions. No chest wall lesions are evident. Review of the MIP images confirms the above findings. IMPRESSION: 1. No demonstrable pulmonary embolus. No thoracic aortic aneurysm or dissection. 2. 1.3 x 1.1 cm nodular opacity abutting the pleura in the inferior lingula. Consider one of the following in 3 months for both low-risk and high-risk individuals: (a) repeat chest CT, (b) follow-up PET-CT, or (c) tissue sampling. This recommendation follows the consensus statement: Guidelines for Management of Incidental Pulmonary Nodules Detected on CT Images:  From the Fleischner Society 2017; Radiology 2017; 284:228-243. No similar changes elsewhere. Bibasilar atelectasis. No frank edema or consolidation. 3. Single mildly prominent subcarinal lymph node of uncertain etiology. No other adenopathy evident. Electronically Signed   By: Lowella Grip III M.D.   On: 05/21/2018 08:48     Assessment and Plan:   1.  NSTEMI - may be from LAD, will add IV heparin, serial troponins and plan for cath in AM will also add NTG paste.  Dr. Meda Coffee has seen.    The patient understands that risks included but are not limited to stroke (1 in 1000), death (1 in 63), kidney failure [usually temporary] (1 in 500), bleeding (1 in 200), allergic reaction [possibly serious] (1 in 200).   2.  DM-2 per IM   3.  HLD add statin  4.  CKD-3, hold ACE and give fluids overnight plan for cardiac cath in AM if Cr stable.  Usual Cr 1.03.   5.  Elevated WBC on admit, at 14.7 on ABX   Signed, Cecilie Kicks, NP  05/21/2018 8:20 PM  The patient was seen, examined and discussed with Cecilie Kicks, NP and I agree with the above.   57 year old morbidly obese female with h/o  DM, HTN, HLP, non-obstructive CAD on cath in 2013 (50% mid LAD), within a myocardial bridge, NICMP with LVEF 40-45% and improvement to 50-55% on echo in 01/2017.  She presented with progressively worsening dyspnea, exertional chest tightness and dizziness with presyncopal episodes. Now dyspnea and chest tightness on minimal exertion, baseline activity very low.   Troponin 0.4-->1.26, Crea 1.8 from baseline 1.0, ECG shows sinus tachycardia with non specific ST-T wave abnormalities.   Physical exam reveals no carotid bruit, S1,2, no significant murmurs, minimal crackles at lung bases.  A/S:  NSTEMI Acute kidney failure  Start Heparin drip Start hydration with NS 100 cc/hr Plan for a LHC in the am given improved Crea Repeat echocardiogram Follow Hb    Ena Dawley, MD 05/21/2018  For questions or  updates, please contact [place] Please consult [email or web address] for contact info under

## 2018-05-21 NOTE — ED Notes (Signed)
Paged admitting per RN Abigail Butts

## 2018-05-21 NOTE — ED Notes (Signed)
Pt agitated that she is in a hallway bed, pt states, "I see empty beds and I should be able to stay in a room." pt updated on the process of assessment and transition to progressive beds per current ED process and leadership, pt verbalizes understanding of process, pt apologetic at this time, will continue to monitor, pt assured she is being monitored, assessed, and pain treated

## 2018-05-21 NOTE — ED Triage Notes (Signed)
Pt reports chest pain for the past 2 days that got worse and woke her up. Pt reports the pain is under her left breast. Pt reports the pain is a pressure type pain.

## 2018-05-21 NOTE — ED Provider Notes (Signed)
[place] EMERGENCY DEPARTMENT Provider Note   CSN: [id number] Arrival date & time: 05/21/18  7824     History   Chief Complaint Chief Complaint  Patient presents with  . Chest Pain    HPI [name] is a 57 y.o. female.  The history is provided by the patient.  Chest Pain   This is a new problem. The current episode started 2 days ago. The problem occurs daily. The problem has been gradually worsening. Pain location: left chest  The pain is moderate. The quality of the pain is described as pressure-like. The pain radiates to the left shoulder. The symptoms are aggravated by certain positions. Associated symptoms include shortness of breath. Pertinent negatives include no abdominal pain, no fever and no vomiting.   Pt reprts CP for past 2 days.  She reports it is worsening It is worse with breathing and palpation No trauma or falls  Past Medical History:  Diagnosis Date  . Adhesive capsulitis of left shoulder    Steroid injection Dr. Truman Hayward 1/12  . Adhesive capsulitis of right shoulder    Steroid injection Dr. Truman Hayward 1/12  . Adhesive capsulitis of shoulder    bilateral, Steroid injection Dr. Truman Hayward 1/12 bilaterally  . CAD (coronary artery disease)    nonobstructive. Last cardiac cath (2008) showing left circumflex with mid 50% stenosis and distal luminal irregularities. Also with RCA with mid to distal 30-40% stenosis. // Previously evaluated by [facility], never followed up outpatient.  Marland Kitchen CAP (community acquired pneumonia) 06/15/2012   05/2012 CXR: Mild opacification of the posterior lung base on the lateral film  as cannot exclude infection/atelectasis.    . CHF (congestive heart failure) ([place])   . Diabetes mellitus 2007   Type II, insulin dependent  . Diabetic retinopathy   . GERD (gastroesophageal reflux disease)   . Glaucoma   . Hyperlipidemia   . Hypertension   . Obesity   . Peripheral neuropathy     Patient Active Problem List   Diagnosis Date Noted  . Chronic lower back pain 02/21/2018  . Chronic pain of right knee 01/23/2018  . Cough 08/23/2017  . Rib pain on right side 06/28/2017  . Lateral pain of right hip 04/27/2017  . Right foot ulcer ([place]) 10/26/2016  . Constipation 09/30/2016  . History of colonic polyps 09/30/2016  . Right foot pain 07/08/2016  . Non-alcoholic fatty liver disease 05/13/2016  . Spinal stenosis in cervical region 09/29/2015  . Bilateral low back pain with sciatica   . Long-term current use of opiate analgesic 07/11/2014  . Onychomycosis 10/29/2013  . Severe nonproliferative diabetic retinopathy ([place]) 04/04/2013  . Primary open angle glaucoma 04/04/2013  . Breast cancer screening 12/05/2012  . Pain and swelling of left knee 10/29/2012  . Insomnia 06/15/2012  . Systolic CHF ([place]) 23/53/6144  . CAD (coronary artery disease) 11/02/2011  . Preventative health care 07/13/2011  . Adhesive capsulitis of right shoulder 11/16/2010  . Gastroesophageal reflux disease 10/12/2009  . Uncontrolled type 2 diabetes with neuropathy ([place]) 06/12/2006  . Hyperlipidemia 06/12/2006  . OBESITY 06/12/2006  . Essential hypertension 06/12/2006    Past Surgical History:  Procedure Laterality Date  . ANTERIOR CERVICAL DECOMP/DISCECTOMY FUSION N/A 06/16/2016   Procedure: Anterior Cervical Decompression/discectomy Fusion - Cervical six - Cervical seven;  Surgeon: Eustace Moore, MD;  Location: [place];  Service: Neurosurgery;  Laterality: N/A;  Anterior Cervical Decompression/discectomy Fusion - Cervical six - Cervical seven  . APPENDECTOMY    .  CARDIAC CATHETERIZATION    . CATARACT EXTRACTION    . GLAUCOMA SURGERY    . LEFT HEART CATHETERIZATION WITH CORONARY ANGIOGRAM N/A 01/24/2012   Procedure: LEFT HEART CATHETERIZATION WITH CORONARY ANGIOGRAM;  Surgeon: Sherren Mocha, MD;  Location: [facility] C[facility];  Service: Cardiovascular;  Laterality: N/A;  . POSTERIOR CERVICAL FUSION/FORAMINOTOMY N/A 10/22/2015    Procedure: Cervical three-Cervical Seven Posterior cervical fusion with lateral mass fixation,  Laminectomy Cervical Three-Cervical Seven;  Surgeon: Eustace Moore, MD;  Location: [place] NEURO ORS;  Service: Neurosurgery;  Laterality: N/A;  Posterior  . TUBAL LIGATION       OB History   None      Home Medications    Prior to Admission medications   Medication Sig Start Date End Date Taking? Authorizing Provider  amitriptyline (ELAVIL) 25 MG tablet Take 1 tablet (25 mg total) by mouth at bedtime. 01/10/18   Axel Filler, MD  aspirin EC 81 MG EC tablet Take 1 tablet (81 mg total) by mouth daily. 05/01/15   Strader, Fransisco Hertz, PA-C  carvedilol (COREG) 6.25 MG tablet TAKE 1 TABLET BY MOUTH TWICE DAILY 05/11/18   Jean Rosenthal, MD  Continuous Blood Gluc Receiver (FREESTYLE LIBRE 14 DAY READER) DEVI 1 each by Does not apply route 4 (four) times daily. 12/26/17   Lucious Groves, DO  Continuous Blood Gluc Sensor (FREESTYLE LIBRE 14 DAY SENSOR) MISC 1 each by Does not apply route QID. 12/26/17   Lucious Groves, DO  cyclobenzaprine (FLEXERIL) 10 MG tablet TAKE 1 TABLET(10 MG) BY MOUTH TWICE DAILY AS NEEDED FOR MUSCLE SPASMS 05/07/18   Jean Rosenthal, MD  diclofenac sodium (VOLTAREN) 1 % GEL Apply 2 g topically 3 (three) times daily as needed. 01/23/18   Dorrell, Andree Elk, MD  dorzolamide-timolol (COSOPT) 22.3-6.8 MG/ML ophthalmic solution Place 1 drop into both eyes 2 (two) times daily.     [provider]  doxycycline (VIBRAMYCIN) 100 MG capsule Take 1 capsule (100 mg total) by mouth 2 (two) times daily. 12/09/17   Ward, Ozella Almond, PA-C  fluticasone (FLONASE) 50 MCG/ACT nasal spray Place 1 spray into both nostrils daily. 11/10/17 11/10/18  Collier Salina, MD  furosemide (LASIX) 20 MG tablet Take 1 tablet (20 mg total) by mouth daily. 03/08/18   Jean Rosenthal, MD  gabapentin (NEURONTIN) 300 MG capsule Take 1 capsule (300 mg total) by mouth 3 (three) times daily. 01/10/18   Axel Filler, MD  glucose blood [facility] VERIO) test strip USE TO CHECK BLOOD SUGAR 4 TIMES DAILY AS DIRECTED 01/30/18   Axel Filler, MD  Insulin Pen Needle 32G X 4 MM MISC Use to inject insulin up to 4 times daily 01/29/18   Agyei, Caprice Kluver, MD  IRON PO Take 1 tablet by mouth 3 (three) times daily.    [provider]  latanoprost (XALATAN) 0.005 % ophthalmic solution Place 1 drop into both eyes at bedtime. Reported on 10/29/2015 05/12/15   [provider]  lisinopril (PRINIVIL,ZESTRIL) 20 MG tablet TAKE 1 TABLET(20 MG) BY MOUTH DAILY 12/11/17   Sid Falcon, MD  metFORMIN (GLUCOPHAGE) 1000 MG tablet TAKE 1 TABLET BY MOUTH TWICE DAILY WITH A MEAL 05/01/18   Jean Rosenthal, MD  naproxen (NAPROSYN) 500 MG tablet  04/17/17   [provider]  NOVOLOG FLEXPEN 100 UNIT/ML FlexPen INJECT 12 UNITS UNDER THE SKIN THREE TIMES DAILY WITH MEALS 04/12/18   Oda Kilts, MD  [place] LANCETS  FINE MISC Check blood sugar as instructed up to 3 times a day 03/14/16   Collier Salina, MD  oxyCODONE-acetaminophen (PERCOCET/ROXICET) 5-325 MG tablet Take 1 tablet by mouth 3 (three) times daily as needed for severe pain. 04/27/18   Aldine Contes, MD  oxyCODONE-acetaminophen (PERCOCET/ROXICET) 5-325 MG tablet Take 1 tablet by mouth every 8 (eight) hours as needed for severe pain. 04/27/18   Aldine Contes, MD  oxyCODONE-acetaminophen (PERCOCET/ROXICET) 5-325 MG tablet Take 1 tablet by mouth every 8 (eight) hours as needed for severe pain. 04/27/18   Aldine Contes, MD  pantoprazole (PROTONIX) 40 MG tablet Take 1 tablet (40 mg total) by mouth daily. 03/08/18   Jean Rosenthal, MD  polyethylene glycol (MIRALAX / GLYCOLAX) packet Take 17 g by mouth daily. 09/23/16   Domenic Moras, PA-C  pravastatin (PRAVACHOL) 20 MG tablet TAKE 1 TABLET BY MOUTH EVERY EVENING 05/11/18   Jean Rosenthal, MD  senna (SENOKOT) 8.6 MG TABS tablet Take 2 tablets (17.2 mg total) by mouth daily. 09/30/16    Collier Salina, MD  TRESIBA FLEXTOUCH 200 UNIT/ML SOPN INJECT 30 UNITS INTO THE SKIN DAILY 04/06/18   Jean Rosenthal, MD  VICTOZA 18 MG/3ML SOPN INJECT 1.2 MG TOTAL INTO THE SKIN DAILY 12/13/17   Annia Belt, MD    Family History Family History  Problem Relation Age of Onset  . Hypertension Sister   . Hypertension Sister   . Hypertension Sister   . Heart attack Neg Hx   . Stroke Neg Hx     Social History Social History   Tobacco Use  . Smoking status: Former Smoker    Packs/day: 0.30    Years: 7.00    Pack years: 2.10    Types: Cigarettes    Last attempt to quit: 06/27/2006    Years since quitting: 11.9  . Smokeless tobacco: Never Used  Substance Use Topics  . Alcohol use: No    Alcohol/week: 0.0 standard drinks  . Drug use: No     Allergies   Patient has no known allergies.   Review of Systems Review of Systems  Constitutional: Negative for fever.  Respiratory: Positive for shortness of breath.   Cardiovascular: Positive for chest pain.  Gastrointestinal: Negative for abdominal pain and vomiting.  All other systems reviewed and are negative.    Physical Exam Updated Vital Signs BP 121/64   Pulse (!) 105   Temp 98.1 F (36.7 C) (Oral)   Resp 13   Ht 1.676 m (5\' 6" )   Wt 104.3 kg   LMP 02/13/2011   SpO2 100%   BMI 37.12 kg/m   Physical Exam CONSTITUTIONAL: Well developed/well nourished, uncomfortable appearing HEAD: Normocephalic/atraumatic EYES: EOMI/PERRL ENMT: Mucous membranes moist NECK: supple no meningeal signs SPINE/BACK:entire spine nontender CV: S1/S2 noted, no murmurs/rubs/gallops noted LUNGS: Lungs are clear to auscultation bilaterally, no apparent distress Chest - Diffuse tenderness to left chest, no crepitus, nurse present for exam No obvious breast mass noted, but limited due to body habitus ABDOMEN: soft, nontender, no rebound or guarding, bowel sounds noted throughout abdomen GU:no cva tenderness NEURO: Pt is  awake/alert/appropriate, moves all extremitiesx4.  No facial droop.   EXTREMITIES: pulses normal/equal, full ROM, bilateral arms are symmetric, no cellulitis, no unilateral edema, distal pulses equal intact.  Diffuse tenderness to left shoulder left bicep. SKIN: warm, color normal PSYCH: no abnormalities of mood noted, alert and oriented to situation   ED Treatments / Results  Labs (all labs ordered are listed,  but only abnormal results are displayed) Labs Reviewed  BASIC METABOLIC PANEL - Abnormal; Notable for the following components:      Result Value   Glucose, Bld 216 (*)    BUN 40 (*)    Creatinine, Ser 1.86 (*)    GFR calc non Af Amer 29 (*)    GFR calc Af Amer 33 (*)    All other components within normal limits  CBC - Abnormal; Notable for the following components:   WBC 14.7 (*)    Hemoglobin 10.9 (*)    MCHC 29.1 (*)    All other components within normal limits  D-DIMER, QUANTITATIVE (NOT AT [facility]) - Abnormal; Notable for the following components:   D-Dimer, Quant 1.38 (*)    All other components within normal limits  I-STAT TROPONIN, ED    EKG EKG Interpretation  Date/Time:  Monday May 21 2018 05:18:23 EST Ventricular Rate:  107 PR Interval:    QRS Duration: 100 QT Interval:  325 QTC Calculation: 434 R Axis:   33 Text Interpretation:  Sinus tachycardia Low voltage, precordial leads Borderline T abnormalities, lateral leads No significant change since last tracing Confirmed by Ripley Fraise [phone]) on 05/21/2018 5:42:43 AM   Radiology Dg Chest 2 View  Result Date: 05/21/2018 CLINICAL DATA:  Chest pain EXAM: CHEST - 2 VIEW COMPARISON:  12/09/2017 FINDINGS: The heart size and mediastinal contours are within normal limits. Both lungs are clear. The visualized skeletal structures are unremarkable. IMPRESSION: No active cardiopulmonary disease. Electronically Signed   By: Ulyses Jarred M.D.   On: 05/21/2018 06:09    Procedures Procedures   Medications  Ordered in ED Medications  sodium chloride 0.9 % bolus 1,000 mL (1,000 mLs Intravenous New Bag/Given 05/21/18 0744)  fentaNYL (SUBLIMAZE) injection 100 mcg (100 mcg Intravenous Given 05/21/18 0647)  aspirin chewable tablet 324 mg (324 mg Oral Given 05/21/18 0647)  fentaNYL (SUBLIMAZE) injection 50 mcg (50 mcg Intravenous Given 05/21/18 0739)     Initial Impression / Assessment and Plan / ED Course  I have reviewed the triage vital signs and the nursing notes.  Pertinent labs & imaging results that were available during my care of the patient were reviewed by me and considered in my medical decision making (see chart for details).     7:56 AM Patient presented for chest pain for several days.  She initially reported felt sharp and was worse with breathing.  She also had reproducible Chest wall pain which made history difficult   She also reports she is now short of breath with minimal exertion, which is new for her.  No known history of VTE. No recent travel or surgery. D-dimer elevated CT chest ordered to r/o PE (decreased IV contrast ordered) Signed out to Dr. Lita Mains with CT imaging pending   Final Clinical Impressions(s) / ED Diagnoses   Final diagnoses:  Pleurisy    ED Discharge Orders    None       Ripley Fraise, MD 05/21/18 0800

## 2018-05-21 NOTE — Consult Note (Addendum)
Cardiology Consultation:   Patient ID: [name] MRN: [id number]; DOB: 1961-05-31  Admit date: 05/21/2018 Date of Consult: 05/21/2018  Primary Care Provider: Tsosie Billing, MD Primary Cardiologist: Sherren Mocha, MD  Primary Electrophysiologist:  None    Patient Profile:   [name] is a 57 y.o. female with a hx of NICM and non obstructive CAD who is being seen today for the evaluation of chest pain at the request of Dr. Dareen Piano. .  History of Present Illness:   [name] with CAD -NICM, nonobstructive on last cath 2013 with 50% stenosis in LAD, within a myocardial bridge, EF 40-45%, last echo 01/2017 with EF 50-55%, G1 DD, essentially normal study.  Other hx of DM-2 on insulin, GERD, HTN and hyperlipidemia.  Pt now presents with chest pain  Over last 2-3 days.  She notes over last couple of months she has not been her usual with DOE that has continued to progress.  Now with sharp chest pain, midsternal to Lt ant chest.  + nausea and dizziness and DOE.     EKG I personally reviewed sT with low voltage ant leads  Troponin 0.48; 1.26  Na 136, K+ 5.1, Cr 1.86, BNP 30.9 WBC 10.7, Hgb 9.8 and HCt 33.1  DDimer elevated at 1.38 with CTA of chest  neg for PE, 1.3 x 1.1 cm nodular opacity abutting the pleura in the inferior lingula.  Single mildly prominent subcarinal lymph node of uncertain etiology. No other adenopathy evident. CXR No active cardiopulmonary disease  Still with occ pain.      Past Medical History:  Diagnosis Date  . Adhesive capsulitis of left shoulder    Steroid injection Dr. Truman Hayward 1/12  . Adhesive capsulitis of right shoulder    Steroid injection Dr. Truman Hayward 1/12  . Adhesive capsulitis of shoulder    bilateral, Steroid injection Dr. Truman Hayward 1/12 bilaterally  . CAD (coronary artery disease)    nonobstructive. Last cardiac cath (2008) showing left circumflex with mid 50% stenosis and distal luminal irregularities. Also with RCA with mid to distal  30-40% stenosis. // Previously evaluated by [facility], never followed up outpatient.  Marland Kitchen CAP (community acquired pneumonia) 06/15/2012   05/2012 CXR: Mild opacification of the posterior lung base on the lateral film  as cannot exclude infection/atelectasis.    . CHF (congestive heart failure) ([place])   . Diabetes mellitus 2007   Type II, insulin dependent  . Diabetic retinopathy   . GERD (gastroesophageal reflux disease)   . Glaucoma   . Hyperlipidemia   . Hypertension   . Obesity   . Peripheral neuropathy     Past Surgical History:  Procedure Laterality Date  . ANTERIOR CERVICAL DECOMP/DISCECTOMY FUSION N/A 06/16/2016   Procedure: Anterior Cervical Decompression/discectomy Fusion - Cervical six - Cervical seven;  Surgeon: Eustace Moore, MD;  Location: [facility];  Service: Neurosurgery;  Laterality: N/A;  Anterior Cervical Decompression/discectomy Fusion - Cervical six - Cervical seven  . APPENDECTOMY    . CARDIAC CATHETERIZATION    . CATARACT EXTRACTION    . GLAUCOMA SURGERY    . LEFT HEART CATHETERIZATION WITH CORONARY ANGIOGRAM N/A 01/24/2012   Procedure: LEFT HEART CATHETERIZATION WITH CORONARY ANGIOGRAM;  Surgeon: Sherren Mocha, MD;  Location: [facility] CATH LAB;  Service: Cardiovascular;  Laterality: N/A;  . POSTERIOR CERVICAL FUSION/FORAMINOTOMY N/A 10/22/2015   Procedure: Cervical three-Cervical Seven Posterior cervical fusion with lateral mass fixation,  Laminectomy Cervical Three-Cervical Seven;  Surgeon: Eustace Moore, MD;  Location: [facility]  [facility];  Service: Neurosurgery;  Laterality: N/A;  Posterior  . TUBAL LIGATION       Home Medications:  Prior to Admission medications   Medication Sig Start Date End Date Taking? Authorizing Provider  amitriptyline (ELAVIL) 25 MG tablet Take 1 tablet (25 mg total) by mouth at bedtime. 01/10/18  Yes Axel Filler, MD  aspirin EC 81 MG EC tablet Take 1 tablet (81 mg total) by mouth daily. 05/01/15  Yes Strader, Fri[place], PA-C    carvedilol (COREG) 6.25 MG tablet TAKE 1 TABLET BY MOUTH TWICE DAILY Patient taking differently: Take 6.25 mg by mouth 2 (two) times daily with a meal. Take 1 tablet by mouth twice daily. 05/11/18  Yes Agyei, Obed Raliegh Ip, MD  COMBIGAN 0.2-0.5 % ophthalmic solution Place 1 drop into both eyes 2 (two) times daily. 03/22/18  Yes [provider]  cyclobenzaprine (FLEXERIL) 10 MG tablet TAKE 1 TABLET(10 MG) BY MOUTH TWICE DAILY AS NEEDED FOR MUSCLE SPASMS Patient taking differently: Take 10 mg by mouth as needed.  05/07/18  Yes Agyei, Caprice Kluver, MD  diclofenac sodium (VOLTAREN) 1 % GEL Apply 2 g topically 3 (three) times daily as needed. 01/23/18  Yes Dorrell, Andree Elk, MD  dorzolamide (TRUSOPT) 2 % ophthalmic solution Place 1 drop into both eyes 2 (two) times daily. 03/22/18  Yes [provider]  fluticasone (FLONASE) 50 MCG/ACT nasal spray Place 1 spray into both nostrils daily. Patient taking differently: Place 1 spray into both nostrils as needed.  11/10/17 11/10/18 Yes Rice, Resa Miner, MD  furosemide (LASIX) 20 MG tablet Take 1 tablet (20 mg total) by mouth daily. 03/08/18  Yes Agyei, Caprice Kluver, MD  gabapentin (NEURONTIN) 300 MG capsule Take 1 capsule (300 mg total) by mouth 3 (three) times daily. 01/10/18  Yes Axel Filler, MD  IRON PO Take 1 tablet by mouth 3 (three) times daily.   Yes [provider]  lisinopril (PRINIVIL,ZESTRIL) 20 MG tablet TAKE 1 TABLET(20 MG) BY MOUTH DAILY Patient taking differently: Take 10 mg by mouth daily.  12/11/17  Yes Sid Falcon, MD  metFORMIN (GLUCOPHAGE) 1000 MG tablet TAKE 1 TABLET BY MOUTH TWICE DAILY WITH A MEAL Patient taking differently: Take 1,000 mg by mouth 2 (two) times daily with a meal.  05/01/18  Yes Agyei, Caprice Kluver, MD  mupirocin ointment (BACTROBAN) 2 % Apply 1 application topically daily. 05/10/18  Yes [provider]  NOVOLOG FLEXPEN 100 UNIT/ML FlexPen INJECT 12 UNITS UNDER THE SKIN THREE TIMES DAILY WITH  MEALS Patient taking differently: Inject 8-10 Units into the skin 3 (three) times daily with meals. Sliding scale 04/12/18  Yes Oda Kilts, MD  oxyCODONE-acetaminophen (PERCOCET/ROXICET) 5-325 MG tablet Take 1 tablet by mouth every 8 (eight) hours as needed for severe pain. 04/27/18  Yes Aldine Contes, MD  pantoprazole (PROTONIX) 40 MG tablet Take 1 tablet (40 mg total) by mouth daily. 03/08/18  Yes Agyei, Caprice Kluver, MD  polyethylene glycol (MIRALAX / GLYCOLAX) packet Take 17 g by mouth daily. Patient taking differently: Take 17 g by mouth daily as needed.  09/23/16  Yes Domenic Moras, PA-C  pravastatin (PRAVACHOL) 20 MG tablet TAKE 1 TABLET BY MOUTH EVERY EVENING Patient taking differently: Take 20 mg by mouth at bedtime.  05/11/18  Yes Jean Rosenthal, MD  senna (SENOKOT) 8.6 MG TABS tablet Take 2 tablets (17.2 mg total) by mouth daily. Patient taking differently: Take 2 tablets by mouth as needed.  09/30/16  Yes Rice,  Resa Miner, MD  TRESIBA FLEXTOUCH 200 UNIT/ML SOPN INJECT 30 UNITS INTO THE SKIN DAILY Patient taking differently: Inject 33 Units into the skin daily.  04/06/18  Yes Agyei, Obed Raliegh Ip, MD  VICTOZA 18 MG/3ML SOPN INJECT 1.2 MG TOTAL INTO THE SKIN DAILY Patient taking differently: Inject 0.1 mg into the skin daily.  12/13/17  Yes Annia Belt, MD  Continuous Blood Gluc Receiver (FREESTYLE LIBRE 14 DAY READER) DEVI 1 each by Does not apply route 4 (four) times daily. 12/26/17   Lucious Groves, DO  Continuous Blood Gluc Sensor (FREESTYLE LIBRE 14 DAY SENSOR) MISC 1 each by Does not apply route QID. 12/26/17   Lucious Groves, DO  doxycycline (VIBRAMYCIN) 100 MG capsule Take 1 capsule (100 mg total) by mouth 2 (two) times daily. Patient not taking: Reported on 05/21/2018 12/09/17   Ward, Ozella Almond, PA-C  glucose blood (ONETOUCH VERIO) test strip USE TO CHECK BLOOD SUGAR 4 TIMES DAILY AS DIRECTED 01/30/18   Axel Filler, MD  Insulin Pen Needle 32G X 4 MM MISC Use to  inject insulin up to 4 times daily 01/29/18   Jean Rosenthal, MD  [facility] DELICA LANCETS FINE MISC Check blood sugar as instructed up to 3 times a day 03/14/16   Collier Salina, MD  oxyCODONE-acetaminophen (PERCOCET/ROXICET) 5-325 MG tablet Take 1 tablet by mouth 3 (three) times daily as needed for severe pain. Patient not taking: Reported on 05/21/2018 04/27/18   Aldine Contes, MD  oxyCODONE-acetaminophen (PERCOCET/ROXICET) 5-325 MG tablet Take 1 tablet by mouth every 8 (eight) hours as needed for severe pain. Patient not taking: Reported on 05/21/2018 04/27/18   Aldine Contes, MD    Inpatient Medications: Scheduled Meds: . amitriptyline  25 mg Oral QHS  . [START ON 05/22/2018] aspirin EC  81 mg Oral Daily  . [START ON 05/22/2018] azithromycin  500 mg Oral Once  . carvedilol  6.25 mg Oral BID  . furosemide  20 mg Oral Daily  . gabapentin  300 mg Oral TID  . heparin  4,000 Units Intravenous Once  . insulin aspart  0-15 Units Subcutaneous TID WC  . insulin aspart  0-5 Units Subcutaneous QHS  . insulin aspart  4 Units Subcutaneous TID WC  . insulin glargine  30 Units Subcutaneous QHS  . pravastatin  20 mg Oral QPM   Continuous Infusions: . [START ON 05/22/2018] cefTRIAXone (ROCEPHIN)  IV    . heparin     PRN Meds: acetaminophen **OR** acetaminophen, oxyCODONE-acetaminophen, senna-docusate  Allergies:   No Known Allergies  Social History:   Social History   Socioeconomic History  . Marital status: Single    Spouse name: Not on file  . Number of children: 2  . Years of education: 12th grade  . Highest education level: Not on file  Occupational History  . Occupation: Development worker, community: UNEMPLOYED    Comment: used to work in [employer] fast streamer at [employer]). Got disability in 2007.   Social Needs  . Financial resource strain: Not on file  . Food insecurity:    Worry: Not on file    Inability: Not on file  . Transportation needs:    Medical: Not  on file    Non-medical: Not on file  Tobacco Use  . Smoking status: Former Smoker    Packs/day: 0.30    Years: 7.00    Pack years: 2.10    Types: Cigarettes    Last attempt to quit:  06/27/2006    Years since quitting: 11.9  . Smokeless tobacco: Never Used  Substance and Sexual Activity  . Alcohol use: No    Alcohol/week: 0.0 standard drinks  . Drug use: No  . Sexual activity: Not Currently  Lifestyle  . Physical activity:    Days per week: Not on file    Minutes per session: Not on file  . Stress: Not on file  Relationships  . Social connections:    Talks on phone: Not on file    Gets together: Not on file    Attends religious service: Not on file    Active member of club or organization: Not on file    Attends meetings of clubs or organizations: Not on file    Relationship status: Not on file  . Intimate partner violence:    Fear of current or ex partner: Not on file    Emotionally abused: Not on file    Physically abused: Not on file    Forced sexual activity: Not on file  Other Topics Concern  . Not on file  Social History Narrative   Lives with her kids, 38yo daughter and 87 yo adopted son.    Family History:    Family History  Problem Relation Age of Onset  . Heart disease Mother   . Hypertension Sister   . Hypertension Sister   . Hypertension Sister   . Heart attack Neg Hx   . Stroke Neg Hx      ROS:  Please see the history of present illness.  General:no colds or fevers, no weight changes Skin:no rashes or ulcers HEENT:no blurred vision, no congestion CV:see HPI PUL:see HPI GI:no diarrhea constipation or melena, no indigestion GU:no hematuria, no dysuria MS:no joint pain, no claudication Neuro:no syncope, + lightheadedness Endo:+ diabetes not well controlled, no thyroid disease  All other ROS reviewed and negative.     Physical Exam/Data:   Vitals:   05/21/18 0530 05/21/18 0545 05/21/18 0745 05/21/18 1030  BP:  121/64 (!) 102/50 118/70   Pulse: (!) 106 (!) 105 100 100  Resp: 13 13 (!) 26 16  Temp:      TempSrc:      SpO2: 99% 100% 100% 100%  Weight:      Height:       No intake or output data in the 24 hours ending 05/21/18 2020 Filed Weights   05/21/18 0514  Weight: 104.3 kg   Body mass index is 37.12 kg/m.  General:  Well nourished, well developed, in no acute distress HEENT: normal Lymph: no adenopathy Neck: no JVD Endocrine:  No thryomegaly Vascular: No carotid bruits; pedal pulses 2+ bilaterally   Cardiac:  normal S1, S2; RRR; no murmur gallup rub or click Lungs:  clear to auscultation bilaterally, no wheezing, rhonchi or rales  Abd: soft, nontender, no hepatomegaly  Ext: no edema Musculoskeletal:  No deformities, BUE and BLE strength normal and equal Skin: warm and dry  Neuro:  CNs 2-12 intact, no focal abnormalities noted Psych:  Normal affect     Relevant CV Studies: Cardiac cath 2013   Angiography   Left Main:  Smooth and normal  Left anterior Descending: Proximal vessel is normal.  There is a mid stenosis of 50% that appears to be within a myocardial bridge. The distal LAD is fairly normal.  The 1st diagonal is normal  Left Circumflex:  The left circumflex is a large vessel.  It gives off a large 1st OM which is  normal.  The distal LCx is normal  Right Coronary Artery: moderate in size and is normal.  The PDA is small but is normal  LV Gram: moderate global hypokinesia with EF of 40-45%.   Complications: No apparent complications Patient did tolerate procedure well.  Conclusions:   1. Minor coronary irregularities.  There is a 50% stenosis in the mid LAD that may be associated with myocardial bridging.  It does not appear to obstruct flow. 2. Moderate LV dysfunction with EF of 40-45%   Echo 01/2017 Study Conclusions  - Left ventricle: The cavity size was normal. Systolic function was   normal. The estimated ejection fraction was in the range of 50%   to 55%. Wall motion  was normal; there were no regional wall   motion abnormalities. Doppler parameters are consistent with   abnormal left ventricular relaxation (grade 1 diastolic   dysfunction). Doppler parameters are consistent with high   ventricular filling pressure. - Aortic valve: Transvalvular velocity was within the normal range.   There was no stenosis. There was no regurgitation. - Mitral valve: Transvalvular velocity was within the normal range.   There was no evidence for stenosis. There was no regurgitation. - Right ventricle: Systolic function was normal. - Tricuspid valve: There was no regurgitation.   Laboratory Data:  Chemistry Recent Labs  Lab 05/21/18 0515  NA 136  K 5.1  CL 103  CO2 24  GLUCOSE 216*  BUN 40*  CREATININE 1.86*  CALCIUM 9.3  GFRNONAA 29*  GFRAA 33*  ANIONGAP 9    No results for input(s): PROT, ALBUMIN, AST, ALT, ALKPHOS, BILITOT in the last 168 hours. Hematology Recent Labs  Lab 05/21/18 0515 05/21/18 1439  WBC 14.7* 10.7*  RBC 4.05 3.59*  HGB 10.9* 9.8*  HCT 37.4 33.1*  MCV 92.3 92.2  MCH 26.9 27.3  MCHC 29.1* 29.6*  RDW 14.3 14.2  PLT 307 259   Cardiac Enzymes Recent Labs  Lab 05/21/18 1439 05/21/18 1800  TROPONINI 0.48* 1.26*    Recent Labs  Lab 05/21/18 0523  TROPIPOC 0.06    BNP Recent Labs  Lab 05/21/18 0515  BNP 30.9    DDimer  Recent Labs  Lab 05/21/18 0515  DDIMER 1.38*    Radiology/Studies:  Dg Chest 2 View  Result Date: 05/21/2018 CLINICAL DATA:  Chest pain EXAM: CHEST - 2 VIEW COMPARISON:  12/09/2017 FINDINGS: The heart size and mediastinal contours are within normal limits. Both lungs are clear. The visualized skeletal structures are unremarkable. IMPRESSION: No active cardiopulmonary disease. Electronically Signed   By: Ulyses Jarred M.D.   On: 05/21/2018 06:09   Ct Angio Chest Pe W And/or Wo Contrast  Result Date: 05/21/2018 CLINICAL DATA:  Chest pain and shortness of breath EXAM: CT ANGIOGRAPHY CHEST  WITH CONTRAST TECHNIQUE: Multidetector CT imaging of the chest was performed using the standard protocol during bolus administration of intravenous contrast. Multiplanar CT image reconstructions and MIPs were obtained to evaluate the vascular anatomy. CONTRAST:  140mL ISOVUE-370 IOPAMIDOL (ISOVUE-370) INJECTION 76% COMPARISON:  Chest radiograph May 21, 2018 FINDINGS: Cardiovascular: There is no demonstrable pulmonary embolus. There is no thoracic aortic aneurysm or dissection. The visualized great vessels appear unremarkable except for calcification in the proximal left internal carotid artery. There is no pericardial effusion or pericardial thickening. Mediastinum/Nodes: Visualized thyroid appears normal. There are occasional subcentimeter mediastinal lymph nodes. There is a subcarinal lymph node measuring 1.3 x 1.2 cm. No other lymph node prominence evident. No esophageal lesions are appreciable.  Lungs/Pleura: There is patchy atelectasis in both lower lung zones. There is no frank edema or consolidation. On axial slice 74 series 7, there is a 1.3 x 1.1 cm opacity abutting the pleura in the inferior lingular region. No similar changes are noted elsewhere. No pleural effusion evident. Upper Abdomen: Visualized upper abdominal structures appear unremarkable. Musculoskeletal: There is degenerative change in the lower thoracic spine. There are no blastic or lytic bone lesions. No chest wall lesions are evident. Review of the MIP images confirms the above findings. IMPRESSION: 1. No demonstrable pulmonary embolus. No thoracic aortic aneurysm or dissection. 2. 1.3 x 1.1 cm nodular opacity abutting the pleura in the inferior lingula. Consider one of the following in 3 months for both low-risk and high-risk individuals: (a) repeat chest CT, (b) follow-up PET-CT, or (c) tissue sampling. This recommendation follows the consensus statement: Guidelines for Management of Incidental Pulmonary Nodules Detected on CT Images:  From the Fleischner Society 2017; Radiology 2017; 284:228-243. No similar changes elsewhere. Bibasilar atelectasis. No frank edema or consolidation. 3. Single mildly prominent subcarinal lymph node of uncertain etiology. No other adenopathy evident. Electronically Signed   By: Lowella Grip III M.D.   On: 05/21/2018 08:48     Assessment and Plan:   1.  NSTEMI - may be from LAD, will add IV heparin, serial troponins and plan for cath in AM will also add NTG paste.  Dr. Meda Coffee has seen.    The patient understands that risks included but are not limited to stroke (1 in 1000), death (1 in 10), kidney failure [usually temporary] (1 in 500), bleeding (1 in 200), allergic reaction [possibly serious] (1 in 200).   2.  DM-2 per IM   3.  HLD add statin  4.  CKD-3, hold ACE and give fluids overnight plan for cardiac cath in AM if Cr stable.  Usual Cr 1.03.   5.  Elevated WBC on admit, at 14.7 on ABX   Signed, Cecilie Kicks, NP  05/21/2018 8:20 PM  The patient was seen, examined and discussed with Cecilie Kicks, NP and I agree with the above.   57 year old morbidly obese female with h/o  DM, HTN, HLP, non-obstructive CAD on cath in 2013 (50% mid LAD), within a myocardial bridge, NICMP with LVEF 40-45% and improvement to 50-55% on echo in 01/2017.  She presented with progressively worsening dyspnea, exertional chest tightness and dizziness with presyncopal episodes. Now dyspnea and chest tightness on minimal exertion, baseline activity very low.   Troponin 0.4-->1.26, Crea 1.8 from baseline 1.0, ECG shows sinus tachycardia with non specific ST-T wave abnormalities.   Physical exam reveals no carotid bruit, S1,2, no significant murmurs, minimal crackles at lung bases.  A/S:  NSTEMI Acute kidney failure  Start Heparin drip Start hydration with NS 100 cc/hr Plan for a LHC in the am given improved Crea Repeat echocardiogram Follow Hb    Ena Dawley, MD 05/21/2018  For questions or  updates, please contact [facility] Please consult [email or web address] for contact info under

## 2018-05-21 NOTE — ED Notes (Signed)
Troponin .48  Reported to [employer]

## 2018-05-21 NOTE — ED Notes (Signed)
Regular Diet was ordered for Lunch. 

## 2018-05-21 NOTE — H&P (Signed)
Date: 05/21/2018               Patient Name:  [name] MRN: [id number]  DOB: Oct 04, 1960 Age / Sex: 57 y.o., female   PCP: Tsosie Billing, MD         Medical Service: Internal Medicine Teaching Service         Attending Physician: Dr. Aldine Contes, MD    First Contact: Dr. Koleen Distance  Pager: [phone]  Second Contact: Dr. Tarri Abernethy Pager: [phone]       After Hours (After 5p/  First Contact Pager: [phone]  weekends / holidays): Second Contact Pager: [phone]   Chief Complaint: shortness of breath  History of Present Illness: [name] is a 57 year old female with past medical history of uncontrolled type 2 diabetes on insulin, combined heart failure, HTN and NICM that presents  with a 3 day history of dyspnea on exertion. She states that it has been difficult to walk across the room without feeling short of breath. She also reports shortness of breath at rest and when laying flat.  She has associated symptoms of chest pain and non productive cough.    She states her chest pain started 3 days ago and is intermittent. It is described as a sharp pain on the left side of her chest that radiates down her arm.  She denies chest pain on exertion.  She denies nausea/vomiting, fever/chills abdominal pain or increased leg swelling.     Meds:  Current Meds  Medication Sig  . amitriptyline (ELAVIL) 25 MG tablet Take 1 tablet (25 mg total) by mouth at bedtime.  Marland Kitchen aspirin EC 81 MG EC tablet Take 1 tablet (81 mg total) by mouth daily.  . carvedilol (COREG) 6.25 MG tablet TAKE 1 TABLET BY MOUTH TWICE DAILY (Patient taking differently: Take 6.25 mg by mouth 2 (two) times daily with a meal. Take 1 tablet by mouth twice daily.)  . COMBIGAN 0.2-0.5 % ophthalmic solution Place 1 drop into both eyes 2 (two) times daily.  . cyclobenzaprine (FLEXERIL) 10 MG tablet TAKE 1 TABLET(10 MG) BY MOUTH TWICE DAILY AS NEEDED FOR MUSCLE SPASMS (Patient taking differently: Take 10 mg by  mouth as needed. )  . diclofenac sodium (VOLTAREN) 1 % GEL Apply 2 g topically 3 (three) times daily as needed.  . dorzolamide (TRUSOPT) 2 % ophthalmic solution Place 1 drop into both eyes 2 (two) times daily.  . fluticasone (FLONASE) 50 MCG/ACT nasal spray Place 1 spray into both nostrils daily. (Patient taking differently: Place 1 spray into both nostrils as needed. )  . furosemide (LASIX) 20 MG tablet Take 1 tablet (20 mg total) by mouth daily.  Marland Kitchen gabapentin (NEURONTIN) 300 MG capsule Take 1 capsule (300 mg total) by mouth 3 (three) times daily.  . IRON PO Take 1 tablet by mouth 3 (three) times daily.  Marland Kitchen lisinopril (PRINIVIL,ZESTRIL) 20 MG tablet TAKE 1 TABLET(20 MG) BY MOUTH DAILY (Patient taking differently: Take 10 mg by mouth daily. )  . metFORMIN (GLUCOPHAGE) 1000 MG tablet TAKE 1 TABLET BY MOUTH TWICE DAILY WITH A MEAL (Patient taking differently: Take 1,000 mg by mouth 2 (two) times daily with a meal. )  . mupirocin ointment (BACTROBAN) 2 % Apply 1 application topically daily.  Marland Kitchen NOVOLOG FLEXPEN 100 UNIT/ML FlexPen INJECT 12 UNITS UNDER THE SKIN THREE TIMES DAILY WITH MEALS (Patient taking differently: Inject 8-10 Units into the skin 3 (three) times daily with meals. Sliding scale)  . oxyCODONE-acetaminophen (  PERCOCET/ROXICET) 5-325 MG tablet Take 1 tablet by mouth every 8 (eight) hours as needed for severe pain.  . pantoprazole (PROTONIX) 40 MG tablet Take 1 tablet (40 mg total) by mouth daily.  . polyethylene glycol (MIRALAX / GLYCOLAX) packet Take 17 g by mouth daily. (Patient taking differently: Take 17 g by mouth daily as needed. )  . pravastatin (PRAVACHOL) 20 MG tablet TAKE 1 TABLET BY MOUTH EVERY EVENING (Patient taking differently: Take 20 mg by mouth at bedtime. )  . senna (SENOKOT) 8.6 MG TABS tablet Take 2 tablets (17.2 mg total) by mouth daily. (Patient taking differently: Take 2 tablets by mouth as needed. )  . TRESIBA FLEXTOUCH 200 UNIT/ML SOPN INJECT 30 UNITS INTO THE SKIN  DAILY (Patient taking differently: Inject 33 Units into the skin daily. )  . VICTOZA 18 MG/3ML SOPN INJECT 1.2 MG TOTAL INTO THE SKIN DAILY (Patient taking differently: Inject 0.1 mg into the skin daily. )  . [DISCONTINUED] dorzolamide-timolol (COSOPT) 22.3-6.8 MG/ML ophthalmic solution Place 1 drop into both eyes 2 (two) times daily.   . [DISCONTINUED] latanoprost (XALATAN) 0.005 % ophthalmic solution Place 1 drop into both eyes at bedtime. Reported on 10/29/2015     Allergies: Allergies as of 05/21/2018  . (No Known Allergies)   Past Medical History:  Diagnosis Date  . Adhesive capsulitis of left shoulder    Steroid injection Dr. Truman Hayward 1/12  . Adhesive capsulitis of right shoulder    Steroid injection Dr. Truman Hayward 1/12  . Adhesive capsulitis of shoulder    bilateral, Steroid injection Dr. Truman Hayward 1/12 bilaterally  . CAD (coronary artery disease)    nonobstructive. Last cardiac cath (2008) showing left circumflex with mid 50% stenosis and distal luminal irregularities. Also with RCA with mid to distal 30-40% stenosis. // Previously evaluated by [name], never followed up outpatient.  Marland Kitchen CAP (community acquired pneumonia) 06/15/2012   05/2012 CXR: Mild opacification of the posterior lung base on the lateral film  as cannot exclude infection/atelectasis.    . CHF (congestive heart failure) ([place])   . Diabetes mellitus 2007   Type II, insulin dependent  . Diabetic retinopathy   . GERD (gastroesophageal reflux disease)   . Glaucoma   . Hyperlipidemia   . Hypertension   . Obesity   . Peripheral neuropathy     Family History:  Family History  Problem Relation Age of Onset  . Hypertension Sister   . Hypertension Sister   . Hypertension Sister   . Heart attack Neg Hx   . Stroke Neg Hx     Social History: tobacco use: Denies Alcohol use: Denies Illicit drug use: Denies  Review of Systems: A complete ROS was negative except as per HPI.   Physical Exam: Blood pressure 118/70,  pulse 100, temperature 98.1 F (36.7 C), temperature source Oral, resp. rate 16, height 5\' 6"  (1.676 m), weight 104.3 kg, last menstrual period 02/13/2011, SpO2 100 %. Physical Exam  Constitutional: She is oriented to person, place, and time and well-developed, well-nourished, and in no distress.  HENT:  Head: Normocephalic and atraumatic.  Eyes: Pupils are equal, round, and reactive to light. Conjunctivae are normal.  Neck: Normal range of motion.  Cardiovascular: Normal rate, regular rhythm and normal heart sounds. Exam reveals no gallop and no friction rub.  No murmur heard. Pulmonary/Chest: Effort normal and breath sounds normal. No respiratory distress. She has no wheezes. She has no rales. She exhibits tenderness.  Abdominal: Soft. Bowel sounds are normal. She exhibits  no distension and no mass. There is no tenderness. There is no rebound and no guarding.  Musculoskeletal: She exhibits no edema.  Neurological: She is alert and oriented to person, place, and time.  Skin: Skin is warm and dry.     EKG: personally reviewed my interpretation is sinus tachycardia  CXR: personally reviewed my interpretation is no active cardiopulmonary process  [name] is a 57 year old female with past medical history of combined CHF, and ICM, HTN and uncontrolled type 2 diabetes that presents to [facility] for dyspnea on exertion with associated symptoms of chest pain, cough and orthopnea.  On presentation patient has a leukocytosis of 14, an elevated d-dimer and AKI.  Chest x-ray showed no acute cardiopulmonary process. CT angiogram of chest showed a nodular opacity abutting the pleura in the inferior lingula. EKG with sinus tachycardia.    Assessment & Plan by Problem: Active Problems:   Shortness of breath  Shortness of breath Unclear etiology. Due to leukocytosis and cough patient was started on CAP coverage in the ED with Levaquin.  Chest x-ray without signs of pneumonia.  Will obtain RVP and  pro-calcitonin.  Low suspicion for pneumonia.  Will switch antibiotics to azithromycin and ceftriaxone for now.   Patient is satting well on room air and has not been hypoxic here.  Will ambulate with pulse ox.  Patient presents with symptoms of heart failure. She has a known history of combined heart failure and reports adherence to Lasix, carvedilol and lisinopril. Last echo was 2018 showing an LVEF of 50-55 percent and grade 1 diastolic dysfunction.  Will obtain BNP.     Due to elevated D-Dimer patient had a CT angiogram of chest an opacity abutting the pleura in the inferior lingula.  This could possibly be causing her cough.  This will need follow-up. -switch Levaquin to azithromycin and ceftriaxone -RVP, pro-calcitonin - BNP -Ambulate with pulse ox  -continue carvedilol and Lasix  Chest pain Pain is reproducible on exam.  Initial troponin was 0.06. EKG without ischemic changes. Risk factors for cardiac disease include uncontrolled DM, HTN and former smoker. Will trend troponin. -troponin x 2 -EKG in the morning  Acute kidney injury Creatinine of 1.8 and baseline is 1.0.  Patient is taking lisinopril daily will hold this and any nephrotoxic agents.  Patient had CT w contrast today. S/P 1L NS -Avoiding nephrotoxic agents   - S/p 1L NS  Uncontrolled Type 2 diabetes Patient currently takes receive a 33 units at night and NovoLog 8-10 units with meals. -Lantus 30 units -NovoLog 4 units with meals -SSI   Dispo: Admit patient to Inpatient with expected length of stay greater than 2 midnights.  Signed: Valinda Party, DO 05/21/2018, 12:06 PM  Pager: [phone]

## 2018-05-21 NOTE — ED Notes (Signed)
Pt returned from CT and is agitated she is in a hall bed, pt informed that the charge RN will speak with the pt

## 2018-05-21 NOTE — Progress Notes (Signed)
ANTICOAGULATION CONSULT NOTE - Initial Consult  Pharmacy Consult for Heparin Indication: chest pain/ACS  No Known Allergies  Patient Measurements: Height: 5\' 6"  (167.6 cm) Weight: 230 lb (104.3 kg) IBW/kg (Calculated) : 59.3 Heparin Dosing Weight: 83.2 kg  Vital Signs: BP: 118/70 (11/25 1030) Pulse Rate: 100 (11/25 1030)  Labs: Recent Labs    05/21/18 0515 05/21/18 1439 05/21/18 1800  HGB 10.9* 9.8*  --   HCT 37.4 33.1*  --   PLT 307 259  --   CREATININE 1.86*  --   --   TROPONINI  --  0.48* 1.26*    Estimated Creatinine Clearance: 39.7 mL/min (A) (by C-G formula based on SCr of 1.86 mg/dL (H)).   Medical History: Past Medical History:  Diagnosis Date  . Adhesive capsulitis of left shoulder    Steroid injection Dr. Truman Hayward 1/12  . Adhesive capsulitis of right shoulder    Steroid injection Dr. Truman Hayward 1/12  . Adhesive capsulitis of shoulder    bilateral, Steroid injection Dr. Truman Hayward 1/12 bilaterally  . CAD (coronary artery disease)    nonobstructive. Last cardiac cath (2008) showing left circumflex with mid 50% stenosis and distal luminal irregularities. Also with RCA with mid to distal 30-40% stenosis. // Previously evaluated by [facility], never followed up outpatient.  Marland Kitchen CAP (community acquired pneumonia) 06/15/2012   05/2012 CXR: Mild opacification of the posterior lung base on the lateral film  as cannot exclude infection/atelectasis.    . CHF (congestive heart failure) ([place])   . Diabetes mellitus 2007   Type II, insulin dependent  . Diabetic retinopathy   . GERD (gastroesophageal reflux disease)   . Glaucoma   . Hyperlipidemia   . Hypertension   . Obesity   . Peripheral neuropathy     Assessment: Patient 62 yoF presenting with chest pain. Pharmacy has been consulted for heparin dosing. No anticoagulation PTA. HgB 10.9; PLT 307.   Goal of Therapy:  Heparin level 0.3-0.7 units/ml Monitor platelets by anticoagulation protocol: Yes   Plan:  Give 4000  units bolus x 1 Start heparin infusion at 1200 units/hr Check anti-Xa level in 6 hours and daily while on heparin Continue to monitor H&H and platelets  Monitor for s/sx of bleed  Willia Craze, Pharmacy Student

## 2018-05-21 NOTE — ED Notes (Signed)
MD notified of troponin of 1.26 via text page

## 2018-05-21 NOTE — Progress Notes (Signed)
Troponin of 0.48 reported to MD via text page.

## 2018-05-22 ENCOUNTER — Other Ambulatory Visit: Payer: Self-pay

## 2018-05-22 ENCOUNTER — Encounter (HOSPITAL_COMMUNITY)
Admission: EM | Disposition: A | Discharge status: Home / Self Care | Payer: Self-pay | Source: Home / Self Care | Attending: Internal Medicine

## 2018-05-22 ENCOUNTER — Encounter (HOSPITAL_COMMUNITY): Payer: Self-pay | Admitting: Cardiology

## 2018-05-22 DIAGNOSIS — N179 Acute kidney failure, unspecified: Secondary | ICD-10-CM

## 2018-05-22 DIAGNOSIS — I255 Ischemic cardiomyopathy: Secondary | ICD-10-CM

## 2018-05-22 DIAGNOSIS — I2510 Atherosclerotic heart disease of native coronary artery without angina pectoris: Secondary | ICD-10-CM

## 2018-05-22 DIAGNOSIS — I5032 Chronic diastolic (congestive) heart failure: Secondary | ICD-10-CM

## 2018-05-22 DIAGNOSIS — Z794 Long term (current) use of insulin: Secondary | ICD-10-CM

## 2018-05-22 DIAGNOSIS — E118 Type 2 diabetes mellitus with unspecified complications: Secondary | ICD-10-CM

## 2018-05-22 DIAGNOSIS — I110 Hypertensive heart disease with heart failure: Secondary | ICD-10-CM

## 2018-05-22 DIAGNOSIS — E785 Hyperlipidemia, unspecified: Secondary | ICD-10-CM

## 2018-05-22 DIAGNOSIS — Z9861 Coronary angioplasty status: Secondary | ICD-10-CM

## 2018-05-22 DIAGNOSIS — I214 Non-ST elevation (NSTEMI) myocardial infarction: Secondary | ICD-10-CM

## 2018-05-22 DIAGNOSIS — I252 Old myocardial infarction: Secondary | ICD-10-CM

## 2018-05-22 HISTORY — PX: LEFT HEART CATH AND CORONARY ANGIOGRAPHY: CATH118249

## 2018-05-22 LAB — RESPIRATORY PANEL BY PCR
ADENOVIRUS-RVPPCR: NOT DETECTED
BORDETELLA PERTUSSIS-RVPCR: NOT DETECTED
CHLAMYDOPHILA PNEUMONIAE-RVPPCR: NOT DETECTED
CORONAVIRUS NL63-RVPPCR: NOT DETECTED
Coronavirus 229E: NOT DETECTED
Coronavirus HKU1: NOT DETECTED
Coronavirus OC43: NOT DETECTED
INFLUENZA A-RVPPCR: NOT DETECTED
INFLUENZA B-RVPPCR: NOT DETECTED
MYCOPLASMA PNEUMONIAE-RVPPCR: NOT DETECTED
Metapneumovirus: NOT DETECTED
PARAINFLUENZA VIRUS 4-RVPPCR: NOT DETECTED
Parainfluenza Virus 1: NOT DETECTED
Parainfluenza Virus 2: NOT DETECTED
Parainfluenza Virus 3: NOT DETECTED
RESPIRATORY SYNCYTIAL VIRUS-RVPPCR: NOT DETECTED
Rhinovirus / Enterovirus: NOT DETECTED

## 2018-05-22 LAB — BASIC METABOLIC PANEL
ANION GAP: 8 (ref 5–15)
BUN: 21 mg/dL — AB (ref 6–20)
CHLORIDE: 102 mmol/L (ref 98–111)
CO2: 26 mmol/L (ref 22–32)
Calcium: 9.3 mg/dL (ref 8.9–10.3)
Creatinine, Ser: 1.22 mg/dL — ABNORMAL HIGH (ref 0.44–1.00)
GFR calc Af Amer: 55 mL/min — ABNORMAL LOW (ref >60)
GFR, EST NON AFRICAN AMERICAN: 48 mL/min — AB (ref >60)
Glucose, Bld: 323 mg/dL — ABNORMAL HIGH (ref 70–99)
POTASSIUM: 4.6 mmol/L (ref 3.5–5.1)
SODIUM: 136 mmol/L (ref 135–145)

## 2018-05-22 LAB — SURGICAL PCR SCREEN
MRSA, PCR: NEGATIVE
STAPHYLOCOCCUS AUREUS: NEGATIVE

## 2018-05-22 LAB — URINALYSIS, ROUTINE W REFLEX MICROSCOPIC
BACTERIA UA: NONE SEEN
BILIRUBIN URINE: NEGATIVE
Glucose, UA: >=500 mg/dL — AB
Hgb urine dipstick: NEGATIVE
Ketones, ur: NEGATIVE mg/dL
Leukocytes, UA: NEGATIVE
NITRITE: NEGATIVE
PH: 5 (ref 5.0–8.0)
Protein, ur: NEGATIVE mg/dL
SPECIFIC GRAVITY, URINE: 1.015 (ref 1.005–1.030)

## 2018-05-22 LAB — CBC
HCT: 32.8 % — ABNORMAL LOW (ref 36.0–46.0)
HEMATOCRIT: 32 % — AB (ref 36.0–46.0)
HEMOGLOBIN: 9.7 g/dL — AB (ref 12.0–15.0)
Hemoglobin: 10.3 g/dL — ABNORMAL LOW (ref 12.0–15.0)
MCH: 27 pg (ref 26.0–34.0)
MCH: 28.1 pg (ref 26.0–34.0)
MCHC: 30.3 g/dL (ref 30.0–36.0)
MCHC: 31.4 g/dL (ref 30.0–36.0)
MCV: 89.1 fL (ref 80.0–100.0)
MCV: 89.4 fL (ref 80.0–100.0)
NRBC: 0 % (ref 0.0–0.2)
NRBC: 0 % (ref 0.0–0.2)
Platelets: 257 10*3/uL (ref 150–400)
Platelets: 229 10*3/uL (ref 150–400)
RBC: 3.59 MIL/uL — AB (ref 3.87–5.11)
RBC: 3.67 MIL/uL — AB (ref 3.87–5.11)
RDW: 14.2 % (ref 11.5–15.5)
RDW: 14.1 % (ref 11.5–15.5)
WBC: 10.3 10*3/uL (ref 4.0–10.5)
WBC: 9.6 10*3/uL (ref 4.0–10.5)

## 2018-05-22 LAB — CREATININE, SERUM
CREATININE: 1.13 mg/dL — AB (ref 0.44–1.00)
GFR calc non Af Amer: 53 mL/min — ABNORMAL LOW (ref >60)

## 2018-05-22 LAB — TROPONIN I
Troponin I: 1.3 ng/mL (ref <0.03)
Troponin I: 1.98 ng/mL (ref <0.03)
Troponin I: 2.15 ng/mL (ref <0.03)

## 2018-05-22 LAB — HIV ANTIBODY (ROUTINE TESTING W REFLEX): HIV Screen 4th Generation wRfx: NONREACTIVE

## 2018-05-22 LAB — GLUCOSE, CAPILLARY
GLUCOSE-CAPILLARY: 284 mg/dL — AB (ref 70–99)
GLUCOSE-CAPILLARY: 243 mg/dL — AB (ref 70–99)
Glucose-Capillary: 302 mg/dL — ABNORMAL HIGH (ref 70–99)
Glucose-Capillary: 248 mg/dL — ABNORMAL HIGH (ref 70–99)
Glucose-Capillary: 281 mg/dL — ABNORMAL HIGH (ref 70–99)

## 2018-05-22 LAB — HEPARIN LEVEL (UNFRACTIONATED): Heparin Unfractionated: 0.45 IU/mL (ref 0.30–0.70)

## 2018-05-22 SURGERY — LEFT HEART CATH AND CORONARY ANGIOGRAPHY
Anesthesia: LOCAL

## 2018-05-22 MED ORDER — NITROGLYCERIN IN D5W 200-5 MCG/ML-% IV SOLN: 0.0000 ug/min | INTRAVENOUS | Status: DC

## 2018-05-22 MED ORDER — SODIUM CHLORIDE 0.9 % IV SOLN: 250.0000 mL | INTRAVENOUS | Status: DC | PRN

## 2018-05-22 MED ORDER — MIDAZOLAM HCL 2 MG/2ML IJ SOLN
INTRAMUSCULAR | Status: AC
  Filled 2018-05-22: qty 2

## 2018-05-22 MED ORDER — HEPARIN SODIUM (PORCINE) 1000 UNIT/ML IJ SOLN
INTRAMUSCULAR | Status: DC | PRN
  Administered 2018-05-22: 6000 [IU] via INTRAVENOUS

## 2018-05-22 MED ORDER — ACETAMINOPHEN 325 MG PO TABS: 650.0000 mg | ORAL_TABLET | ORAL | Status: DC | PRN

## 2018-05-22 MED ORDER — SODIUM CHLORIDE 0.9 % IV SOLN: INTRAVENOUS | Status: AC

## 2018-05-22 MED ORDER — ONDANSETRON HCL 4 MG/2ML IJ SOLN: 4.0000 mg | Freq: Four times a day (QID) | INTRAMUSCULAR | Status: DC | PRN

## 2018-05-22 MED ORDER — LIDOCAINE HCL (PF) 1 % IJ SOLN
INTRAMUSCULAR | Status: DC | PRN
  Administered 2018-05-22: 2 mL

## 2018-05-22 MED ORDER — NITROGLYCERIN IN D5W 200-5 MCG/ML-% IV SOLN
2.0000 ug/min | INTRAVENOUS | Status: DC
  Administered 2018-05-22: 5 ug/min via INTRAVENOUS
  Filled 2018-05-22: qty 250

## 2018-05-22 MED ORDER — VERAPAMIL HCL 2.5 MG/ML IV SOLN
INTRAVENOUS | Status: DC | PRN
  Administered 2018-05-22: 10:00:00 via INTRA_ARTERIAL

## 2018-05-22 MED ORDER — LIDOCAINE HCL (PF) 1 % IJ SOLN
INTRAMUSCULAR | Status: AC
  Filled 2018-05-22: qty 30

## 2018-05-22 MED ORDER — HEPARIN (PORCINE) IN NACL 1000-0.9 UT/500ML-% IV SOLN
INTRAVENOUS | Status: DC | PRN
  Administered 2018-05-22: 500 mL

## 2018-05-22 MED ORDER — IOHEXOL 350 MG/ML SOLN
INTRAVENOUS | Status: DC | PRN
  Administered 2018-05-22: 150 mL via INTRA_ARTERIAL

## 2018-05-22 MED ORDER — FENTANYL CITRATE (PF) 100 MCG/2ML IJ SOLN
INTRAMUSCULAR | Status: DC | PRN
  Administered 2018-05-22 (×2): 25 ug via INTRAVENOUS

## 2018-05-22 MED ORDER — MIDAZOLAM HCL 2 MG/2ML IJ SOLN
INTRAMUSCULAR | Status: DC | PRN
  Administered 2018-05-22 (×2): 1 mg via INTRAVENOUS
  Administered 2018-05-22: 2 mg via INTRAVENOUS

## 2018-05-22 MED ORDER — VERAPAMIL HCL 2.5 MG/ML IV SOLN
INTRAVENOUS | Status: AC
  Filled 2018-05-22: qty 2

## 2018-05-22 MED ORDER — INSULIN ASPART 100 UNIT/ML ~~LOC~~ SOLN
6.0000 [IU] | Freq: Three times a day (TID) | SUBCUTANEOUS | Status: DC
  Administered 2018-05-23 (×2): 6 [IU] via SUBCUTANEOUS

## 2018-05-22 MED ORDER — HEPARIN SODIUM (PORCINE) 5000 UNIT/ML IJ SOLN
5000.0000 [IU] | Freq: Three times a day (TID) | INTRAMUSCULAR | Status: DC
  Administered 2018-05-22 – 2018-05-24 (×5): 5000 [IU] via SUBCUTANEOUS
  Filled 2018-05-22 (×5): qty 1

## 2018-05-22 MED ORDER — FENTANYL CITRATE (PF) 100 MCG/2ML IJ SOLN
INTRAMUSCULAR | Status: AC
  Filled 2018-05-22: qty 2

## 2018-05-22 MED ORDER — HEPARIN SODIUM (PORCINE) 1000 UNIT/ML IJ SOLN
INTRAMUSCULAR | Status: AC
  Filled 2018-05-22: qty 1

## 2018-05-22 MED ORDER — SODIUM CHLORIDE 0.9% FLUSH: 3.0000 mL | INTRAVENOUS | Status: DC | PRN

## 2018-05-22 MED ORDER — MORPHINE SULFATE (PF) 2 MG/ML IV SOLN
2.0000 mg | INTRAVENOUS | Status: DC | PRN
  Administered 2018-05-22 – 2018-05-23 (×2): 2 mg via INTRAVENOUS
  Filled 2018-05-22 (×2): qty 1

## 2018-05-22 MED ORDER — ATORVASTATIN CALCIUM 80 MG PO TABS
80.0000 mg | ORAL_TABLET | Freq: Every day | ORAL | Status: DC
  Administered 2018-05-22 – 2018-05-23 (×2): 80 mg via ORAL
  Filled 2018-05-22 (×2): qty 1

## 2018-05-22 MED ORDER — SODIUM CHLORIDE 0.9 % IV SOLN
INTRAVENOUS | Status: AC | PRN
  Administered 2018-05-22: 10 mL/h via INTRAVENOUS

## 2018-05-22 MED ORDER — HEPARIN (PORCINE) IN NACL 1000-0.9 UT/500ML-% IV SOLN
INTRAVENOUS | Status: AC
  Filled 2018-05-22: qty 1000

## 2018-05-22 MED ORDER — DIAZEPAM 5 MG PO TABS: 5.0000 mg | ORAL_TABLET | Freq: Four times a day (QID) | ORAL | Status: DC | PRN

## 2018-05-22 MED ORDER — ASPIRIN 81 MG PO CHEW: 81.0000 mg | CHEWABLE_TABLET | Freq: Every day | ORAL | Status: DC

## 2018-05-22 MED ORDER — SODIUM CHLORIDE 0.9% FLUSH
3.0000 mL | Freq: Two times a day (BID) | INTRAVENOUS | Status: DC
  Administered 2018-05-23 – 2018-05-24 (×3): 3 mL via INTRAVENOUS

## 2018-05-22 SURGICAL SUPPLY — 20 items
CATH INFINITI 5 FR JL3.5 (CATHETERS) ×2 IMPLANT
CATH INFINITI 5FR ANG PIGTAIL (CATHETERS) ×2 IMPLANT
CATH INFINITI 5FR JK (CATHETERS) ×2 IMPLANT
CATH INFINITI 5FR JL4 (CATHETERS) ×2 IMPLANT
CATH LAUNCHER 5F EBU3.5 (CATHETERS) ×2 IMPLANT
CATH LAUNCHER 5F EBU4.0 (CATHETERS) ×2 IMPLANT
CATH LAUNCHER 5F RADL (CATHETERS) ×1 IMPLANT
CATH OPTITORQUE TIG 4.0 5F (CATHETERS) ×2 IMPLANT
CATHETER LAUNCHER 5F RADL (CATHETERS) ×2
DEVICE RAD COMP TR BAND LRG (VASCULAR PRODUCTS) ×2 IMPLANT
GLIDESHEATH SLEND SS 6F .021 (SHEATH) ×2 IMPLANT
GUIDEWIRE INQWIRE 1.5J.035X260 (WIRE) ×1 IMPLANT
INQWIRE 1.5J .035X260CM (WIRE) ×2
KIT HEART LEFT (KITS) ×2 IMPLANT
PACK CARDIAC CATHETERIZATION (CUSTOM PROCEDURE TRAY) ×2 IMPLANT
SHEATH PROBE COVER 6X72 (BAG) ×2 IMPLANT
SYR MEDRAD MARK 7 150ML (SYRINGE) ×2 IMPLANT
SYR MEDRAD MARK V 150ML (SYRINGE) ×2 IMPLANT
TRANSDUCER W/STOPCOCK (MISCELLANEOUS) ×2 IMPLANT
TUBING CIL FLEX 10 FLL-RA (TUBING) ×2 IMPLANT

## 2018-05-22 NOTE — Interval H&P Note (Signed)
Cath Lab Visit (complete for each Cath Lab visit)  Clinical Evaluation Leading to the Procedure:   ACS: Yes.    Non-ACS:    Anginal Classification: CCS III  Anti-ischemic medical therapy: Maximal Therapy (2 or more classes of medications)  Non-Invasive Test Results: No non-invasive testing performed  Prior CABG: No previous CABG      History and Physical Interval Note:  05/22/2018 9:10 AM  [name]  has presented today for surgery, with the diagnosis of NSTEMI  The various methods of treatment have been discussed with the patient and family. After consideration of risks, benefits and other options for treatment, the patient has consented to  Procedure(s): LEFT HEART CATH AND CORONARY ANGIOGRAPHY (N/A) as a surgical intervention .  The patient's history has been reviewed, patient examined, no change in status, stable for surgery.  I have reviewed the patient's chart and labs.  Questions were answered to the patient's satisfaction.     Shelva Majestic

## 2018-05-22 NOTE — Progress Notes (Signed)
  Date: 05/22/2018  Patient name: [name]  Medical record number: [id number]  Date of birth: 03/25/1961   I have seen and evaluated Marcene Brawn and discussed their care with the Residency Team.  In brief, patient is a 57 year old female with a past medical history of type 2 diabetes, hypertension, chronic diastolic heart failure, hyperlipidemia who presented to the ED with progressive shortness of breath and dyspnea on exertion over the last 3 days.  Patient states that approximately 3 days prior to admission she noted progressive shortness of breath.  Patient states that it was initially only with exertion but on the night prior to admission it was also at rest.  Patient also complained of associated chest pain which is left-sided, nonradiating, sharp in nature.  No relation to exertion.  Patient also complained of associated nausea but no vomiting.  Patient complained of diaphoresis as well associated with chest pain.  No palpitations, no lightheadedness, no syncope, no focal weakness, no tingling or numbness, no headache, no blurry vision, no diarrhea, no abdominal pain, no fevers.  Today patient states that chest pain is much improved and she has only some residual soreness.  She denies any shortness of breath currently.  PMHx, Fam Hx, and/or Soc Hx : As per resident admit note  Vitals:   05/22/18 1450 05/22/18 1510  BP: 138/61 (!) 113/48  Pulse: 88 93  Resp: 13 16  Temp:    SpO2: 99% 99%   General: Awake, alert, oriented x3, NAD CVS: Regular rate and rhythm, normal heart sounds Lungs: CTA bilaterally Abdomen: Soft, mild diffuse tenderness, no rebound/guarding, nondistended, normoactive bowel sounds Extremities: No edema noted  Assessment and Plan: I have seen and evaluated the patient as outlined above. I agree with the formulated Assessment and Plan as detailed in the residents' note, with the following changes:   1.  Non-ST elevation MI: -Patient presented to the ED  with progressive shortness of breath and left-sided chest pain in the setting of a history of uncontrolled diabetes and is found to have elevated troponins (max of 2.15).  EKG with no acute ST/T wave changes.  Given patient's symptoms as well as elevated troponins patient was at high risk for an acute coronary syndrome.  She was initially started on heparin drip and had a left heart cath done today. -Left heart cath showed severe multivessel CAD and new severe LV dysfunction with an EF of 25 to 30%. -Patient with significant progression of her CAD since her cath in 2013.  Patient is not a good candidate for CABG given that most of her lesions of her distal.  We will continue with medical management for now per cardiology -We will continue with carvedilol and consider adding nitrates as well as possibly Ranexa if angina persists. -If patient does not respond to medical management she may need a staged PCI per cardiology. -Of note, patient did have an AKI on admission with a creatinine of 1.8 from baseline of 1.  Creatinine improved to 1.2 today.  We will continue to monitor her creatinine closely given contrast load with catheterization. -No further work-up at this time.  Aldine Contes, MD 11/26/20193:57 PM

## 2018-05-22 NOTE — Progress Notes (Signed)
ANTICOAGULATION CONSULT NOTE  Pharmacy Consult for Heparin Indication: chest pain/ACS  No Known Allergies  Patient Measurements: Height: 5\' 6"  (167.6 cm) Weight: 264 lb 5.3 oz (119.9 kg) IBW/kg (Calculated) : 59.3 Heparin Dosing Weight: 83.2 kg  Vital Signs: Temp: 98.4 F (36.9 C) (11/25 2304) Temp Source: Oral (11/25 2304) BP: 126/67 (11/26 0340) Pulse Rate: 95 (11/25 2304)  Labs: Recent Labs    05/21/18 0515 05/21/18 1439 05/21/18 1800 05/21/18 2353 05/22/18 0241  HGB 10.9* 9.8*  --   --  9.7*  HCT 37.4 33.1*  --   --  32.0*  PLT 307 259  --   --  257  HEPARINUNFRC  --   --   --   --  0.45  CREATININE 1.86*  --   --   --   --   TROPONINI  --  0.48* 1.26* 1.98*  --     Estimated Creatinine Clearance: 42.9 mL/min (A) (by C-G formula based on SCr of 1.86 mg/dL (H)).  Assessment: 57 y.o. female with chest pain for heparin   Goal of Therapy:  Heparin level 0.3-0.7 units/ml Monitor platelets by anticoagulation protocol: Yes   Plan:  Continue Heparin at current rate Follow up after cath today   Phillis Knack, PharmD, BCPS

## 2018-05-22 NOTE — Progress Notes (Signed)
CRITICAL VALUE ALERT  Critical Value:  Troponin 1.98   Date & Time Notied: 05/22/18 0215  Provider Notified: On call cards MD [facility]  Orders Received/Actions taken: TBD

## 2018-05-22 NOTE — Progress Notes (Signed)
Called by RN regarding chest pain.  The patient had developed chest pain this evening.  It is improving with nitrates, but is not resolved.  An ECG is pending.  Ordered morphine for additional pain control.  Advised the nurse to call once the ECG is completed.  Continue medical therapy as long as symptoms are improving, if pain is not able to be relieved or ECG is consistent with STEMI, she may need to go back to the lab.  Rosaria Ferries, PA-C 05/22/2018 8:07 PM Beeper [phone]

## 2018-05-22 NOTE — Progress Notes (Signed)
   Subjective: Ms. Schroyer was seen and evaluated this afternoon after her LHC. She was feeling well. Her chest pain is significantly improved from when she came in. Denies shortness of breath, n/v, abdominal pain or lower extremity swelling. Discussed with her the plan to medically manage her heart disease and newly found HFrEF. She endorsed understanding. All questions and concerns were addressed.   Objective:  Vital signs in last 24 hours: Vitals:   05/22/18 1600 05/22/18 1630 05/22/18 1700 05/22/18 1730  BP: 138/62 (!) 143/67 139/83 116/65  Pulse:      Resp:      Temp:      TempSrc:      SpO2:      Weight:      Height:       General: awake, alert, lying in bed in NAD CV: RRR; no murmurs, rubs or gallops Pulm: normal respiratory effort; lungs CTA bilaterally Ext: no edema    Assessment/Plan:  Active Problems:   Shortness of breath   Non-ST elevation (NSTEMI) myocardial infarction ([place])  1. NSTEMI Acute HFrEF 2/2 ischemic cardiomyopathy - s/p LHC which showed significant progression of CAD since cath in 2013, as well as new severely reduced LV dysfunction with EF fo 25-30% - not a good candidate for CABG given that her lesions were primarily distal - cardiology recommended aggressive medical management; if this is not successful would consider staged PCI for symptom relief - started on carvedilol with possible addition of nitrates and Ranexa if angina were to persist - will also need to optimize heart failure medications prior to discharge   2. AKI  - pre-renal in etiology given improvement with IVF  - Crt improved from 1.8>1.2; continue to monitor after LHC  3. Uncontrolled DM type II - CBGs consistently over 300 -  Increase novolog to 6 units TID with meals - Lantus 30 units qhs    Dispo: Anticipated discharge in approximately 2-3 day(s).   Modena Nunnery D, DO 05/22/2018, 6:43 PM Pager: [phone]

## 2018-05-22 NOTE — Progress Notes (Signed)
[facility] BAND REMOVAL  LOCATION:    Right radial  DEFLATED PER PROTOCOL:    Yes.    TIME BAND OFF / DRESSING APPLIED:    1445p covered with gauze and tegaderm  SITE UPON ARRIVAL:    Level 0  SITE AFTER BAND REMOVAL:    Level 0  CIRCULATION SENSATION AND MOVEMENT:    Within Normal Limits   Yes.    COMMENTS:   VS remain stable

## 2018-05-22 NOTE — Progress Notes (Signed)
Inpatient Diabetes Program Recommendations  AACE/ADA: New Consensus Statement on Inpatient Glycemic Control (2015)  Target Ranges:  Prepandial:   less than 140 mg/dL      Peak postprandial:   less than 180 mg/dL (1-2 hours)      Critically ill patients:  140 - 180 mg/dL   Lab Results  Component Value Date   GLUCAP 302 (H) 05/22/2018   HGBA1C 10.1 (A) 02/21/2018    Review of Glycemic ControlResults for PARADISE, VENSEL (MRN [id number]) as of 05/22/2018 10:50  Ref. Range 05/21/2018 17:10 05/21/2018 23:36 05/22/2018 08:31  Glucose-Capillary Latest Ref Range: 70 - 99 mg/dL 327 (H) 360 (H) 302 (H)    Diabetes history: DM 2 Outpatient Diabetes medications:  Tresiba 33 units daily, Victoza 1.2 mg daily, Novolog 8-10 units tid with meals, Metformin 1000 mg bid  Current orders for Inpatient glycemic control:  Novolog moderate tid with meals and HS, Novolog 4 units tid with meals, Lantus 30 units q HS  Inpatient Diabetes Program Recommendations:    Note blood sugars increased.  Last A1C indicated uncontrolled DM as well.    -Consider increasing Lantus to 35 units q HS. -May consider increasing Novolog meal coverage to 6 units tid with meals. -Consider rechecking A1C since last A1C was 02/21/18  Thanks,  Adah Perl, RN, BC-ADM Inpatient Diabetes Coordinator Pager [phone] (8a-5p)

## 2018-05-23 ENCOUNTER — Encounter (HOSPITAL_COMMUNITY): Payer: Self-pay | Admitting: Cardiovascular Disease

## 2018-05-23 ENCOUNTER — Inpatient Hospital Stay (HOSPITAL_COMMUNITY): Payer: Medicare HMO

## 2018-05-23 DIAGNOSIS — I214 Non-ST elevation (NSTEMI) myocardial infarction: Secondary | ICD-10-CM

## 2018-05-23 LAB — CBC
HCT: 31.3 % — ABNORMAL LOW (ref 36.0–46.0)
Hemoglobin: 9.6 g/dL — ABNORMAL LOW (ref 12.0–15.0)
MCH: 27.6 pg (ref 26.0–34.0)
MCHC: 30.7 g/dL (ref 30.0–36.0)
MCV: 89.9 fL (ref 80.0–100.0)
Platelets: 241 10*3/uL (ref 150–400)
RBC: 3.48 MIL/uL — ABNORMAL LOW (ref 3.87–5.11)
RDW: 14.1 % (ref 11.5–15.5)
WBC: 8.6 10*3/uL (ref 4.0–10.5)
nRBC: 0 % (ref 0.0–0.2)

## 2018-05-23 LAB — GLUCOSE, CAPILLARY
GLUCOSE-CAPILLARY: 296 mg/dL — AB (ref 70–99)
GLUCOSE-CAPILLARY: 262 mg/dL — AB (ref 70–99)
GLUCOSE-CAPILLARY: 269 mg/dL — AB (ref 70–99)
Glucose-Capillary: 195 mg/dL — ABNORMAL HIGH (ref 70–99)

## 2018-05-23 LAB — BASIC METABOLIC PANEL
ANION GAP: 7 (ref 5–15)
BUN: 13 mg/dL (ref 6–20)
CALCIUM: 9 mg/dL (ref 8.9–10.3)
CHLORIDE: 101 mmol/L (ref 98–111)
CO2: 26 mmol/L (ref 22–32)
CREATININE: 1.06 mg/dL — AB (ref 0.44–1.00)
GFR calc non Af Amer: 57 mL/min — ABNORMAL LOW (ref >60)
Glucose, Bld: 261 mg/dL — ABNORMAL HIGH (ref 70–99)
Potassium: 4.6 mmol/L (ref 3.5–5.1)
SODIUM: 134 mmol/L — AB (ref 135–145)

## 2018-05-23 LAB — ECHOCARDIOGRAM COMPLETE
HEIGHTINCHES: 66 in
Weight: 4164.8 oz

## 2018-05-23 LAB — HEPARIN LEVEL (UNFRACTIONATED)

## 2018-05-23 MED ORDER — INSULIN GLARGINE 100 UNIT/ML ~~LOC~~ SOLN
35.0000 [IU] | Freq: Every day | SUBCUTANEOUS | Status: DC
  Administered 2018-05-23: 35 [IU] via SUBCUTANEOUS
  Filled 2018-05-23: qty 0.35

## 2018-05-23 MED ORDER — INSULIN ASPART 100 UNIT/ML ~~LOC~~ SOLN
8.0000 [IU] | Freq: Three times a day (TID) | SUBCUTANEOUS | Status: DC
  Administered 2018-05-23 – 2018-05-24 (×2): 8 [IU] via SUBCUTANEOUS

## 2018-05-23 MED ORDER — ISOSORBIDE MONONITRATE ER 30 MG PO TB24
30.0000 mg | ORAL_TABLET | Freq: Every day | ORAL | Status: DC
  Administered 2018-05-23 – 2018-05-24 (×2): 30 mg via ORAL
  Filled 2018-05-23 (×2): qty 1

## 2018-05-23 MED ORDER — PERFLUTREN LIPID MICROSPHERE
1.0000 mL | INTRAVENOUS | Status: AC | PRN
  Administered 2018-05-23: 2 mL via INTRAVENOUS
  Filled 2018-05-23: qty 10

## 2018-05-23 MED ORDER — METOPROLOL TARTRATE 25 MG PO TABS
25.0000 mg | ORAL_TABLET | Freq: Two times a day (BID) | ORAL | Status: DC
  Administered 2018-05-23 (×2): 25 mg via ORAL
  Filled 2018-05-23 (×2): qty 1

## 2018-05-23 NOTE — Progress Notes (Signed)
Progress Note  Patient Name: [name] Date of Encounter: 05/23/2018  Primary Cardiologist: Sherren Mocha, MD    Subjective   57 year old female with a history of severe three-vessel coronary artery disease.  She had heart catheterization yesterday which revealed severe diffuse disease of the distal vessels.   Compared to her previous heart catheterization in 2013 there is been significant progression of her coronary artery disease.  Due to the distal disease pattern, coronary artery bypass grafting is not advisable.  Plan is to continue with medical therapy and if medical therapy fails then consider staged PCI.  She had some chest discomfort last night.  She was placed on a nitro drip and feels better this morning. Does not necessarily want to have another cath yet.     Inpatient Medications    Scheduled Meds: . amitriptyline  25 mg Oral QHS  . aspirin EC  81 mg Oral Daily  . atorvastatin  80 mg Oral q1800  . carvedilol  6.25 mg Oral BID  . furosemide  20 mg Oral Daily  . gabapentin  300 mg Oral TID  . heparin  5,000 Units Subcutaneous Q8H  . insulin aspart  0-15 Units Subcutaneous TID WC  . insulin aspart  0-5 Units Subcutaneous QHS  . insulin aspart  6 Units Subcutaneous TID WC  . insulin glargine  30 Units Subcutaneous QHS  . sodium chloride flush  3 mL Intravenous Q12H   Continuous Infusions: . sodium chloride    . nitroGLYCERIN 20 mcg/min (05/22/18 1947)   PRN Meds: sodium chloride, acetaminophen, diazepam, morphine injection, ondansetron (ZOFRAN) IV, oxyCODONE-acetaminophen, senna-docusate, sodium chloride flush   Vital Signs    Vitals:   05/23/18 0400 05/23/18 0500 05/23/18 0530 05/23/18 0559  BP: (!) 95/55 105/66  (!) 104/57  Pulse:    92  Resp:      Temp:    97.6 F (36.4 C)  TempSrc:    Oral  SpO2:  99% 99% 97%  Weight:    118.1 kg  Height:        Intake/Output Summary (Last 24 hours) at 05/23/2018 1026 Last data filed at 05/23/2018  1062 Gross per 24 hour  Intake 437.2 ml  Output 900 ml  Net -462.8 ml   Filed Weights   05/21/18 0514 05/21/18 2300 05/23/18 0559  Weight: 104.3 kg 119.9 kg 118.1 kg    Telemetry    NSR at 94  - Personally Reviewed  ECG     - Personally Reviewed  Physical Exam   GEN:  mordibly obese , middle age female , NAD  Neck: No JVD Cardiac: RRR, no murmurs, rubs, or gallops.  Respiratory: Clear to auscultation bilaterally. GI: Soft, nontender, non-distended  MS: No edema; No deformity.  Right radial cath site looks good Neuro:  Nonfocal  Psych: Normal affect   Labs    Chemistry Recent Labs  Lab 05/21/18 0515 05/22/18 0241 05/22/18 1545 05/23/18 0501  NA 136 136  --  134*  K 5.1 4.6  --  4.6  CL 103 102  --  101  CO2 24 26  --  26  GLUCOSE 216* 323*  --  261*  BUN 40* 21*  --  13  CREATININE 1.86* 1.22* 1.13* 1.06*  CALCIUM 9.3 9.3  --  9.0  GFRNONAA 29* 48* 53* 57*  GFRAA 33* 55* >60 >60  ANIONGAP 9 8  --  7     Hematology Recent Labs  Lab 05/22/18 0241 05/22/18 1545  05/23/18 0501  WBC 10.3 9.6 8.6  RBC 3.59* 3.67* 3.48*  HGB 9.7* 10.3* 9.6*  HCT 32.0* 32.8* 31.3*  MCV 89.1 89.4 89.9  MCH 27.0 28.1 27.6  MCHC 30.3 31.4 30.7  RDW 14.2 14.1 14.1  PLT 257 229 241    Cardiac Enzymes Recent Labs  Lab 05/21/18 1800 05/21/18 2353 05/22/18 0628 05/22/18 1545  TROPONINI 1.26* 1.98* 2.15* 1.30*    Recent Labs  Lab 05/21/18 0523  TROPIPOC 0.06     BNP Recent Labs  Lab 05/21/18 0515  BNP 30.9     DDimer  Recent Labs  Lab 05/21/18 0515  DDIMER 1.38*     Radiology    No results found.  Cardiac Studies     Patient Profile     57 y.o. female   [place]    1.  Coronary artery disease.  She has severe diffuse coronary artery disease involving the distal vessels.  She is not a good candidate for bypass grafting given the anatomy of her lesions.  If she continues to have pain then we will need to consider staged PCI.  We  will try her on isosorbide today as well as increasing her beta-blocker ( change to metoprolol 25 BID  Instead of Coreg)  Will use metoprolol in place of Coreg because her BP is on the low side    Discontinue the nitroglycerin drip.  If she has further pain after discontinuation of a nitro drip and will need to consider PCI on Friday.      For questions or updates, please contact [place] Please consult [email or web address] for contact info under        Signed, Mertie Moores, MD  05/23/2018, 10:26 AM

## 2018-05-23 NOTE — Progress Notes (Signed)
Pt began complaining of chest pain around 7 pm. She rated it a 10 out of 10 as a sharp pain on the left side of her chest. I increased her nitro to 20mcg, got an EKG, applied oxygen, and paged Barrett. EKG placed in chart. Orders for morphine received. Pt's pain decreased to a dull pressure she rated at a 1 out of 10. Will continue to monitor closely.

## 2018-05-23 NOTE — Progress Notes (Signed)
  Echocardiogram 2D Echocardiogram has been performed.  [name] 05/23/2018, 11:33 AM

## 2018-05-23 NOTE — Progress Notes (Signed)
Inpatient Diabetes Program Recommendations  AACE/ADA: New Consensus Statement on Inpatient Glycemic Control (2015)  Target Ranges:  Prepandial:   less than 140 mg/dL      Peak postprandial:   less than 180 mg/dL (1-2 hours)      Critically ill patients:  140 - 180 mg/dL   Lab Results  Component Value Date   GLUCAP 262 (H) 05/23/2018   HGBA1C 10.1 (A) 02/21/2018    Review of Glycemic ControlResults for [name], [name] (MRN [id number]) as of 05/23/2018 14:00  Ref. Range 05/22/2018 15:33 05/22/2018 18:32 05/22/2018 21:45 05/23/2018 07:51 05/23/2018 11:13  Glucose-Capillary Latest Ref Range: 70 - 99 mg/dL 284 (H) 281 (H) 243 (H) 296 (H) 262 (H)   Diabetes history: DM 2 Outpatient Diabetes medications:  Tresiba 33 units daily, Victoza 1.2 mg daily, Novolog 8-10 units tid with meals, Metformin 1000 mg bid  Current orders for Inpatient glycemic control:  Novolog moderate tid with meals and HS, Novolog 6 units tid with meals, Lantus 30 units q HS  Inpatient Diabetes Program Recommendations:    Please consider increasing Lantus to 40 units q HS.  Also consider increasing Novolog meal coverage to 8 units tid with meals.    Thanks,  Adah Perl, RN, BC-ADM Inpatient Diabetes Coordinator Pager [phone] (8a-5p)

## 2018-05-23 NOTE — Progress Notes (Signed)
Internal Medicine Attending:   I saw and examined the patient. I reviewed the resident's note and I agree with the resident's findings and plan as documented in the resident's note.  Patient feels well today.  Overnight she did complain of chest pain that improved mildly with nitrates but resolved after receiving morphine.  Currently she feels chest pain-free.  No nausea or vomiting.  Patient was initially admitted to the hospital with a non-ST elevation MI.  Patient is status post left heart cath that showed significant progression of her CAD since 2013.  Patient is not a good candidate for CABG secondary to the location of the blockages which are primarily distal.  Cardiology follow-up recommendations appreciated.  We will continue with aggressive medical management for now.  Will change carvedilol to metoprolol 25 mg twice daily and DC her nitro drip today.  We will start patient on Imdur.  Repeat echo done today showed a mildly reduced EF of 45 to 50% (on cath patient was noted to have an EF of 25 to 30%).  If patient continues to have chest pain she may get PCI with stent placement on Friday.  No further work-up at this time.  We will continue to monitor closely.

## 2018-05-23 NOTE — Progress Notes (Signed)
   Subjective: [name] was seen and evaluated at bedside on morning rounds. Overnight, complained of 10/10 chest pain that improved with nitrates, but was not resolved. She subsequently received morphine which relieved her pain. EKG showed sinus tach without ischemic changes. This morning, she states that her chest pain resolved with IV morphine. She did however have intermittent chest pain when exerting herself. No nausea or vomiting. She states that she is eating well. Discussed plan for working towards getting her on the best medications for her heart disease. All questions and concerns were addressed.   Objective:  Vital signs in last 24 hours: Vitals:   05/23/18 0530 05/23/18 0559 05/23/18 1133 05/23/18 1452  BP:  (!) 104/57 (!) 108/55 (!) 90/55  Pulse:  92 97 81  Resp:      Temp:  97.6 F (36.4 C)  98.2 F (36.8 C)  TempSrc:  Oral  Oral  SpO2: 99% 97%  100%  Weight:  118.1 kg    Height:       General: Obese female lying in bed in no acute distress. CV: RRR, no murmurs, rubs, or gallops Pulm: CTAB, good air movement and normal respiratory effort. Ext: No LE edema noted bilaterally. R radial cath site without hematoma    Assessment/Plan:  Active Problems:   Shortness of breath   Non-ST elevation (NSTEMI) myocardial infarction ([place])  1. NSTEMI Acute HFrEF 2/2 ischemic cardiomyopathy - s/p LHC which showed significant progression of CAD since cath in 2013, as well as new severely reduced LV dysfunction with EF fo 25-30% - not a good candidate for CABG given that her lesions were primarily distal;  recommended aggressive medical management - switched Carvedilol to Metoprolol 25 BID - discontinuing nitro drip and starting Imdur; if continues to have chest pain, may get PCI on Friday  - greatly appreciate cardiology recommendations   2. AKI  - pre-renal in etiology given improvement with IVF  - Crt returned to baseline; 1.06 this morning after LHC yesterday   3.  Uncontrolled DM type II - CBGs uncontrolled -  Increase novolog to 8 units TID with meals - increase Lantus to 35 units qhs   Dispo: Anticipated discharge in approximately 2-3 day(s).   Modena Nunnery D, DO 05/23/2018, 3:05 PM Pager: [phone]

## 2018-05-24 LAB — BASIC METABOLIC PANEL
ANION GAP: 8 (ref 5–15)
BUN: 14 mg/dL (ref 6–20)
CALCIUM: 9.1 mg/dL (ref 8.9–10.3)
CHLORIDE: 103 mmol/L (ref 98–111)
CO2: 25 mmol/L (ref 22–32)
CREATININE: 1.03 mg/dL — AB (ref 0.44–1.00)
GFR calc Af Amer: >60 mL/min (ref >60)
GFR calc non Af Amer: 59 mL/min — ABNORMAL LOW (ref >60)
Glucose, Bld: 193 mg/dL — ABNORMAL HIGH (ref 70–99)
Potassium: 4.2 mmol/L (ref 3.5–5.1)
Sodium: 136 mmol/L (ref 135–145)

## 2018-05-24 LAB — GLUCOSE, CAPILLARY: Glucose-Capillary: 225 mg/dL — ABNORMAL HIGH (ref 70–99)

## 2018-05-24 MED ORDER — CLOPIDOGREL BISULFATE 75 MG PO TABS
75.0000 mg | ORAL_TABLET | Freq: Every day | ORAL | Status: DC
  Administered 2018-05-24: 75 mg via ORAL
  Filled 2018-05-24: qty 1

## 2018-05-24 MED ORDER — METOPROLOL TARTRATE 50 MG PO TABS: 50.0000 mg | ORAL_TABLET | Freq: Two times a day (BID) | ORAL | 2 refills | Status: DC

## 2018-05-24 MED ORDER — CLOPIDOGREL BISULFATE 75 MG PO TABS: 75.0000 mg | ORAL_TABLET | Freq: Every day | ORAL | 2 refills | Status: DC

## 2018-05-24 MED ORDER — ATORVASTATIN CALCIUM 80 MG PO TABS: 80.0000 mg | ORAL_TABLET | Freq: Every day | ORAL | 2 refills | Status: DC

## 2018-05-24 MED ORDER — ISOSORBIDE MONONITRATE ER 30 MG PO TB24: 30.0000 mg | ORAL_TABLET | Freq: Every day | ORAL | 2 refills | Status: DC

## 2018-05-24 MED ORDER — METOPROLOL TARTRATE 50 MG PO TABS
50.0000 mg | ORAL_TABLET | Freq: Two times a day (BID) | ORAL | Status: DC
  Administered 2018-05-24: 50 mg via ORAL
  Filled 2018-05-24: qty 1

## 2018-05-24 NOTE — Progress Notes (Signed)
   Subjective: [name] was seen and evaluated at bedside. No acute events overnight. She has been chest pain free since nitro drip was discontinued. Ambulated without difficulty this morning. No shortness of breath or palpitations.  States discussion with cardiologist went well and she understands importance of lifestyle modification and medication adherence.   Objective:  Vital signs in last 24 hours: Vitals:   05/23/18 2140 05/23/18 2255 05/23/18 2257 05/24/18 0535  BP: (!) 96/52 130/73 130/73 118/60  Pulse: 87 95  81  Resp: 18     Temp: 97.9 F (36.6 C)   97.9 F (36.6 C)  TempSrc: Oral   Oral  SpO2: 99%   99%  Weight:    118 kg  Height:       General: awake, alert sitting up in bed in NAD CV: RRR; no murmurs, rubs or gallops Pulm: normal respiratory effort; lungs CTA bilaterally Abd: BS+; abdomen is soft, non-distended, non-tender Ext: no edema  Assessment/Plan:  Active Problems:   Shortness of breath   Non-ST elevation (NSTEMI) myocardial infarction ([place])  1. NSTEMI Acute HFrEF 2/2 ischemic cardiomyopathy - s/pLHC which showedsignificant progression of CAD since cath in 2013, as well as new severely reduced LV dysfunction with EF fo 25-30% - not a good candidate for CABG given that her lesions were primarily distal;  recommended aggressive medical management - Metoprolol has been increased to 50 mg BID - started on Plavix - continue Imdur 30 mg daily - greatly appreciate cardiology recommendations; they have signed off with plans for outpatient follow-up in 1-2 weeks  2. AKI  - pre-renal in etiology given improvement with IVF  - Crt returned to baseline; 1.03 today  3. Uncontrolled DM type II - CBGs greatly improved  - will discharge on home regimen - will need aggressive management given severity of CAD   Dispo: Anticipated discharge home today.   Modena Nunnery D, DO 05/24/2018, 8:02 AM Pager: [phone]

## 2018-05-24 NOTE — Progress Notes (Signed)
Progress Note  Patient Name: [name] Date of Encounter: 05/24/2018  Primary Cardiologist: Sherren Mocha, MD    Subjective   57 year old female with a history of severe three-vessel coronary artery disease.  She had heart catheterization yesterday which revealed severe diffuse disease of the distal vessels.   Compared to her previous heart catheterization in 2013 there is been significant progression of her coronary artery disease.  Due to the distal disease pattern, coronary artery bypass grafting is not advisable.  Plan is to continue with medical therapy and if medical therapy fails then consider staged PCI.  This morning she states she is feeling much better. No chest pain in last day. Reports she ambulated without difficulty.      Inpatient Medications    Scheduled Meds: . amitriptyline  25 mg Oral QHS  . aspirin EC  81 mg Oral Daily  . atorvastatin  80 mg Oral q1800  . furosemide  20 mg Oral Daily  . gabapentin  300 mg Oral TID  . heparin  5,000 Units Subcutaneous Q8H  . insulin aspart  0-15 Units Subcutaneous TID WC  . insulin aspart  0-5 Units Subcutaneous QHS  . insulin aspart  8 Units Subcutaneous TID WC  . insulin glargine  35 Units Subcutaneous QHS  . isosorbide mononitrate  30 mg Oral Daily  . metoprolol tartrate  25 mg Oral BID  . sodium chloride flush  3 mL Intravenous Q12H   Continuous Infusions: . sodium chloride     PRN Meds: sodium chloride, acetaminophen, diazepam, morphine injection, ondansetron (ZOFRAN) IV, oxyCODONE-acetaminophen, senna-docusate, sodium chloride flush   Vital Signs    Vitals:   05/23/18 2140 05/23/18 2255 05/23/18 2257 05/24/18 0535  BP: (!) 96/52 130/73 130/73 118/60  Pulse: 87 95  81  Resp: 18     Temp: 97.9 F (36.6 C)   97.9 F (36.6 C)  TempSrc: Oral   Oral  SpO2: 99%   99%  Weight:    118 kg  Height:        Intake/Output Summary (Last 24 hours) at 05/24/2018 0720 Last data filed at 05/23/2018  1808 Gross per 24 hour  Intake 19.2 ml  Output -  Net 19.2 ml   Filed Weights   05/21/18 2300 05/23/18 0559 05/24/18 0535  Weight: 119.9 kg 118.1 kg 118 kg    Telemetry    NSR at 85 at rest. HR increases with exertion  - Personally Reviewed  ECG     Normal yesterday.- Personally Reviewed  Physical Exam   GEN:  mordibly obese , middle age female , NAD  Neck: No JVD Cardiac: RRR, no murmurs, rubs, or gallops.  Respiratory: Clear to auscultation bilaterally. GI: Soft, nontender, non-distended  MS: No edema; No deformity.  Right radial cath site looks good Neuro:  Nonfocal  Psych: Normal affect   Labs    Chemistry Recent Labs  Lab 05/22/18 0241 05/22/18 1545 05/23/18 0501 05/24/18 0400  NA 136  --  134* 136  K 4.6  --  4.6 4.2  CL 102  --  101 103  CO2 26  --  26 25  GLUCOSE 323*  --  261* 193*  BUN 21*  --  13 14  CREATININE 1.22* 1.13* 1.06* 1.03*  CALCIUM 9.3  --  9.0 9.1  GFRNONAA 48* 53* 57* 59*  GFRAA 55* >60 >60 >60  ANIONGAP 8  --  7 8     Hematology Recent Labs  Lab 05/22/18  0241 05/22/18 1545 05/23/18 0501  WBC 10.3 9.6 8.6  RBC 3.59* 3.67* 3.48*  HGB 9.7* 10.3* 9.6*  HCT 32.0* 32.8* 31.3*  MCV 89.1 89.4 89.9  MCH 27.0 28.1 27.6  MCHC 30.3 31.4 30.7  RDW 14.2 14.1 14.1  PLT 257 229 241    Cardiac Enzymes Recent Labs  Lab 05/21/18 1800 05/21/18 2353 05/22/18 0628 05/22/18 1545  TROPONINI 1.26* 1.98* 2.15* 1.30*    Recent Labs  Lab 05/21/18 0523  TROPIPOC 0.06     BNP Recent Labs  Lab 05/21/18 0515  BNP 30.9     DDimer  Recent Labs  Lab 05/21/18 0515  DDIMER 1.38*     Radiology    No results found.  Cardiac Studies     Patient Profile     57 y.o. female   [place]    1.  Coronary artery disease.  She has severe diffuse coronary artery disease involving the distal vessels.  I personally reviewed her films. Culprit is most likely the distal OM1 or first diagonal. Both vessels are small in  caliber. I doubt the diagonal is big enough to stent. the LAD, distal LCx, and PDA are diffusely diseased. She is not a good candidate for bypass grafting given the anatomy of her lesions.  I think we should continue to aggressively push medical therapy and lifestyle modification.  Will continue Imdur 30 mg daily. Increase metoprolol to 50 mg bid.   We will add Plavix 75 mg daily.   High dose statin therapy.   From our perspective she can be DC today and follow up with Dr. Burt Knack in the office.   [facility] will sign off.   Medication Recommendations:  See [facility] Other recommendations (labs, testing, etc):  none Follow up as an outpatient:  With Dr. Burt Knack in 1-2 weeks.       For questions or updates, please contact [place] Please consult [email or web address] for contact info under        Signed, Breck [place], MD  05/24/2018, 7:20 AM

## 2018-05-24 NOTE — Discharge Summary (Signed)
Name: [name] MRN: [id number] DOB: December 17, 1960 57 y.o. PCP: Tsosie Billing, MD  Date of Admission: 05/21/2018  5:07 AM Date of Discharge: 05/24/2018 Attending Physician: Aldine Contes, MD  Discharge Diagnosis: 1. NSTEMI 2. Ischemic Cardiomyopathy (HFrEF) 3. Uncontrolled DM 4. Left Pleural Nodule  Discharge Medications: Allergies as of 05/24/2018   No Known Allergies     Medication List    STOP taking these medications   azithromycin 250 MG tablet Commonly known as:  ZITHROMAX   benzonatate 100 MG capsule Commonly known as:  TESSALON   carvedilol 6.25 MG tablet Commonly known as:  COREG   doxycycline 100 MG capsule Commonly known as:  VIBRAMYCIN   pravastatin 20 MG tablet Commonly known as:  PRAVACHOL     TAKE these medications   amitriptyline 25 MG tablet Commonly known as:  ELAVIL Take 1 tablet (25 mg total) by mouth at bedtime.   aspirin 81 MG EC tablet Take 1 tablet (81 mg total) by mouth daily.   atorvastatin 80 MG tablet Commonly known as:  LIPITOR Take 1 tablet (80 mg total) by mouth daily at 6 PM.   clopidogrel 75 MG tablet Commonly known as:  PLAVIX Take 1 tablet (75 mg total) by mouth daily.   COMBIGAN 0.2-0.5 % ophthalmic solution Generic drug:  brimonidine-timolol Place 1 drop into both eyes 2 (two) times daily.   cyclobenzaprine 10 MG tablet Commonly known as:  FLEXERIL TAKE 1 TABLET(10 MG) BY MOUTH TWICE DAILY AS NEEDED FOR MUSCLE SPASMS What changed:  See the new instructions.   diclofenac sodium 1 % Gel Commonly known as:  VOLTAREN Apply 2 g topically 3 (three) times daily as needed.   dorzolamide 2 % ophthalmic solution Commonly known as:  TRUSOPT Place 1 drop into both eyes 2 (two) times daily.   fluticasone 50 MCG/ACT nasal spray Commonly known as:  FLONASE Place 1 spray into both nostrils daily. What changed:    when to take this  reasons to take this   FREESTYLE LIBRE [place] 1  each by Does not apply route 4 (four) times daily.   FREESTYLE LIBRE 14 DAY SENSOR Misc 1 each by Does not apply route QID.   furosemide 20 MG tablet Commonly known as:  LASIX Take 1 tablet (20 mg total) by mouth daily.   gabapentin 300 MG capsule Commonly known as:  NEURONTIN Take 1 capsule (300 mg total) by mouth 3 (three) times daily.   glucose blood test strip USE TO CHECK BLOOD SUGAR 4 TIMES DAILY AS DIRECTED   Insulin Pen Needle 32G X 4 MM Misc Use to inject insulin up to 4 times daily   IRON PO Take 1 tablet by mouth 3 (three) times daily.   isosorbide mononitrate 30 MG 24 hr tablet Commonly known as:  IMDUR Take 1 tablet (30 mg total) by mouth daily.   lisinopril 20 MG tablet Commonly known as:  PRINIVIL,ZESTRIL TAKE 1 TABLET(20 MG) BY MOUTH DAILY What changed:  See the new instructions.   metFORMIN 1000 MG tablet Commonly known as:  GLUCOPHAGE TAKE 1 TABLET BY MOUTH TWICE DAILY WITH A MEAL What changed:  See the new instructions.   metoprolol tartrate 50 MG tablet Commonly known as:  LOPRESSOR Take 1 tablet (50 mg total) by mouth 2 (two) times daily.   mupirocin ointment 2 % Commonly known as:  BACTROBAN Apply 1 application topically daily.   NOVOLOG FLEXPEN 100 UNIT/ML FlexPen Generic drug:  insulin aspart  INJECT 12 UNITS UNDER THE SKIN THREE TIMES DAILY WITH MEALS What changed:  See the new instructions.   ONETOUCH DELICA LANCETS FINE Misc Check blood sugar as instructed up to 3 times a day   oxyCODONE-acetaminophen 5-325 MG tablet Commonly known as:  PERCOCET/ROXICET Take 1 tablet by mouth every 8 (eight) hours as needed for severe pain. What changed:  Another medication with the same name was removed. Continue taking this medication, and follow the directions you see here.   pantoprazole 40 MG tablet Commonly known as:  PROTONIX Take 1 tablet (40 mg total) by mouth daily.   polyethylene glycol packet Commonly known as:  MIRALAX /  GLYCOLAX Take 17 g by mouth daily. What changed:    when to take this  reasons to take this   senna 8.6 MG Tabs tablet Commonly known as:  SENOKOT Take 2 tablets (17.2 mg total) by mouth daily. What changed:    when to take this  reasons to take this   TRESIBA FLEXTOUCH 200 UNIT/ML Sopn Generic drug:  Insulin Degludec INJECT 30 UNITS INTO THE SKIN DAILY What changed:  See the new instructions.   VICTOZA 18 MG/3ML Sopn Generic drug:  liraglutide INJECT 1.2 MG TOTAL INTO THE SKIN DAILY What changed:  See the new instructions.     Disposition and follow-up:   [name] was discharged from [facility] in Stable condition.  At the hospital follow up visit please address:  1.  CAD. Ensure that the patient has followed up with cardiology and able to obtain all her outpatient medications. Left Pleural Nodule. Consider one of the following in 3 months for both low-risk and high-risk individuals: (a) repeat chest CT, (b) follow-up PET-CT, or (c) tissue sampling.  2.  Labs / imaging needed at time of follow-up: None  3.  Pending labs/ test needing follow-up: None  Follow-up Appointments: Follow-up Information    Sherren Mocha, MD. Schedule an appointment as soon as possible for a visit in 1 week(s).   Specialty:  Cardiology Contact information: [address] [address] [phone]        Tsosie Billing, MD. Schedule an appointment as soon as possible for a visit in 1 week(s).   Specialty:  Internal Medicine Contact information: [place] [id number] [facility] Course by problem list:  NSTEMI / Ischemic Cardiomyopathy (HFrEF). [name] is a 57 y.o female with combined systolic/diastolic HF, HTN and uncontrolled type II diabetes who presented to the emergency department on 11/25 with exertional dyspnea, chest pain, cough, and orthopnea. Initial EKG was  unremarkable for ischemic changes or changes from her baseline EKG. CTA for rule out of acute PE was unremarkable. Troponin trended up to 2.15 and cardiology was subsequently consulted. The patient was managed as an acute NSTEMI and subsequently taken for left heart cath on 11/26. Catheterization was significant for multivessel disease but was not amenable to CABG. No stents were placed during this time. Aggressive medical management was initiated and staged PCI can be considered for the future. She was started on metoprolol tartrate 50 mg BID, isosorbide monohydrate 30 mg daily, lisinopril 20mg  daily, clopidogrel 75 mg QD, and atorvastatin 80 mg daily. She will need close outpatient follow-up with cardiology.  Uncontrolled DM. Patient was managed in the hospital with sliding scale insulin. She was discharged on metformin 1000 mg BID, Victoza 1.2 mg daily, NovoLog 12 units  TID with meals, and Tresiba 30 units daily. She will need aggressive management with a goal A1c <7.0. No medication changes were made on discharge.  Left Pleural Nodule. CTA on admission illustrated a 1.3 x 1.1 cm plural nodular opacity. Recommended follow-up based on patient's risks. Consider one of the following in 3 months for both low-risk and high-risk individuals: (a) repeat chest CT, (b) follow-up PET-CT, or (c) tissue sampling.  Discharge Vitals:   BP 118/60 (BP Location: Right Arm)   Pulse 81   Temp 97.9 F (36.6 C) (Oral)   Resp 18   Ht 5\' 6"  (1.676 m)   Wt 118 kg   LMP 02/13/2011   SpO2 99%   BMI 42.00 kg/m   Pertinent Labs, Studies, and Procedures:   CTA 05/21/2018 IMPRESSION: 1. No demonstrable pulmonary embolus. No thoracic aortic aneurysm or dissection.  2. 1.3 x 1.1 cm nodular opacity abutting the pleura in the inferior lingula. Consider one of the following in 3 months for both low-risk and high-risk individuals: (a) repeat chest CT, (b) follow-up PET-CT, or (c) tissue sampling. This recommendation  follows the consensus statement: Guidelines for Management of Incidental Pulmonary Nodules Detected on CT Images: From the Fleischner Society 2017; Radiology 2017; 284:228-243. No similar changes elsewhere. Bibasilar atelectasis. No frank edema or consolidation.  3. Single mildly prominent subcarinal lymph node of uncertain etiology. No other adenopathy evident.  Left Heart Cath  Ost 1st Diag to 1st Diag lesion is 95% stenosed.  Prox LAD to Mid LAD lesion is 50% stenosed.  Mid LAD lesion is 80% stenosed.  Dist LAD lesion is 70% stenosed.  2nd Mrg lesion is 95% stenosed.  Mid Cx lesion is 90% stenosed.  Dist Cx lesion is 80% stenosed.  Mid RCA lesion is 30% stenosed.  Dist RCA lesion is 30% stenosed.  LV end diastolic pressure is mildly elevated.  The left ventricular ejection fraction is 25-35% by visual estimate.   Severe multivessel CAD with mild calcification of the proximal LAD, long diffuse 90% stenosis in the first diagonal vessel of the LAD, 50% proximal -mid, 80% diffuse mid LAD stenosis and 70% distal LAD stenoses; circumflex vessel with a large second marginal branch with focal 95% stenosis in the distal portion of this marginal vessel and 90 and 80% mid distal AV groove circumflex stenoses; and mild segmental 30% narrowing before and after the acute margin in the RCA.  New severe LV dysfunction with an EF of 25 to 30% with diffuse hypocontractility involving the mid distal anterolateral wall apex, and apical distal inferior wall and LVEDP 19 mm  RECOMMENDATION: Angiograms were reviewed with colleagues.  In comparison to the prior study of 2013, there is now significant progression of CAD with diffuse stenoses in the diagonal vessel mid distal LAD, distal circumflex marginal, mid distal AV groove circumflex vessels.  Due to the distal disease CABG revascularization is not favorable.  The patient will be gently hydrated post procedure.  She will be started on  aggressive medical management initially and if medical therapy fails consider staged PCI.  The patient will be started on carvedilol, nitrates, consider amlodipine and possible ranolazine.  If she is not felt to be an operative candidate dual antiplatelet therapy will be beneficial.  Discharge Instructions: Discharge Instructions    (HEART FAILURE PATIENTS) Call MD:  Anytime you have any of the following symptoms: 1) 3 pound weight gain in 24 hours or 5 pounds in 1 week 2) shortness of breath, with or without a  dry hacking cough 3) swelling in the hands, feet or stomach 4) if you have to sleep on extra pillows at night in order to breathe.   Complete by:  As directed    Call MD for:  difficulty breathing, headache or visual disturbances   Complete by:  As directed    Call MD for:  persistant dizziness or light-headedness   Complete by:  As directed    Call MD for:  severe uncontrolled pain   Complete by:  As directed    Diet - low sodium heart healthy   Complete by:  As directed    Discharge instructions   Complete by:  As directed    Ms. Merritts, it was a pleasure taking care of you! I am glad you are feeling better.  As you know, your heart disease has worsened so we are starting you on some medications to help decrease your risk of more blockages in the future and prevent you from having chest pain.  Please start taking the Plavix (Clopidogrel) every day. We have also increased the Metoprolol to 50 mg twice daily. Take the Imdur 30 mg every day as well. Your cholesterol medication has been changed to a stronger one (Lipitor 80 mg).  Another important thing you can work towards is having your blood sugars well controlled. This will also decrease your risk of another heart blockage in the future.  Please follow-up with Dr. Antionette Char office in 1-2 weeks. Also call your primary doctor, Dr. Marvel Plan, to set up an appointment to see him in the next 1-2 weeks as well.  I hope you have a wonderful  Thanksgiving!   Increase activity slowly   Complete by:  As directed     Signed: Ina Homes, MD 05/24/2018, 8:52 AM

## 2018-06-05 ENCOUNTER — Ambulatory Visit (INDEPENDENT_AMBULATORY_CARE_PROVIDER_SITE_OTHER): Payer: Medicare HMO | Admitting: Physician Assistant

## 2018-06-05 ENCOUNTER — Encounter: Payer: Self-pay | Admitting: Physician Assistant

## 2018-06-05 VITALS — BP 98/40 | HR 77 | Ht 66.0 in | Wt 268.8 lb

## 2018-06-05 DIAGNOSIS — Z794 Long term (current) use of insulin: Secondary | ICD-10-CM

## 2018-06-05 DIAGNOSIS — E782 Mixed hyperlipidemia: Secondary | ICD-10-CM

## 2018-06-05 DIAGNOSIS — I214 Non-ST elevation (NSTEMI) myocardial infarction: Secondary | ICD-10-CM

## 2018-06-05 DIAGNOSIS — I5042 Chronic combined systolic (congestive) and diastolic (congestive) heart failure: Secondary | ICD-10-CM

## 2018-06-05 DIAGNOSIS — I10 Essential (primary) hypertension: Secondary | ICD-10-CM

## 2018-06-05 DIAGNOSIS — E118 Type 2 diabetes mellitus with unspecified complications: Secondary | ICD-10-CM

## 2018-06-05 DIAGNOSIS — R911 Solitary pulmonary nodule: Secondary | ICD-10-CM

## 2018-06-05 LAB — BASIC METABOLIC PANEL
BUN/Creatinine Ratio: 9 (ref 9–23)
BUN: 11 mg/dL (ref 6–24)
CALCIUM: 9 mg/dL (ref 8.7–10.2)
CO2: 21 mmol/L (ref 20–29)
CREATININE: 1.16 mg/dL — AB (ref 0.57–1.00)
Chloride: 102 mmol/L (ref 96–106)
GFR calc Af Amer: 60 mL/min/{1.73_m2} (ref >59)
GFR calc non Af Amer: 52 mL/min/{1.73_m2} — ABNORMAL LOW (ref >59)
GLUCOSE: 80 mg/dL (ref 65–99)
Potassium: 4.7 mmol/L (ref 3.5–5.2)
SODIUM: 143 mmol/L (ref 134–144)

## 2018-06-05 MED ORDER — ISOSORBIDE MONONITRATE ER 60 MG PO TB24: 60.0000 mg | ORAL_TABLET | Freq: Every day | ORAL | 3 refills | Status: DC

## 2018-06-05 MED ORDER — METOPROLOL TARTRATE 50 MG PO TABS: 75.0000 mg | ORAL_TABLET | Freq: Two times a day (BID) | ORAL | 3 refills | Status: DC

## 2018-06-05 MED ORDER — LISINOPRIL 5 MG PO TABS: 5.0000 mg | ORAL_TABLET | Freq: Every day | ORAL | 3 refills | Status: DC

## 2018-06-05 NOTE — Progress Notes (Addendum)
Cardiology Office Note:    Date:  06/05/2018   ID:  [name], DOB [birth year], MRN [id number]  PCP:  Tsosie Billing, MD  Cardiologist:  Sherren Mocha, MD   Electrophysiologist:  None   Referring MD: Cristy Friedlander*   Chief Complaint  Patient presents with  . Hospitalization Follow-up    s/p MI >> treated medically     History of Present Illness:    [name] is a 57 y.o. female with non-obstructive coronary artery disease, non-ischemic cardiomyopathy, morbid obesity, diabetes, hypertension, hyperlipidemia.   Her EF has been as low as 35-40 in the past but has improved to normal.  She was last seen by Dr. Burt Knack in 01/2017.     She was admitted 11/25-11/28 with a NSTEMI.  Cardiac Catheterization demonstrated diffuse stenoses in the diagonal vessel, mid distal LAD, distal circumflex marginal, mid distal AV groove circumflex vessels.  Due to the distal disease, CABG revascularization was not felt to be favorable.  Medical therapy was recommended.  PCI could be considered if she fails medical therapy.  Dr. Martinique reviewed her films prior to DC and felt that the culprit was likely the distal OM1 or D1.  Both vessels are small in caliber and he doubted the Dx is big enough to stent.  The LAD, distal LCx and PDA are diffusely diseased.  He felt that aggressive medical Rx should be pursued.  Of note, Chest CTA on admission demonstrated no pulmonary embolism but she did have a L lung nodule.  This will need to be followed by primary care.     [name] returns for posthospitalization follow-up.  She is here alone.  Since discharge from the hospital, she has noted improved symptoms.  She still has occasional chest discomfort and left arm pain as well as shortness of breath with exertion.  However, this is significantly reduced.  She notices symptoms at night as well as when her blood sugar drops and her heart rate increases.  She sleeps on several pillows.  She denies  paroxysmal nocturnal dyspnea.  She denies syncope but has been lightheaded at times.  She denies lower extremity swelling.  Prior CV studies:   The following studies were reviewed today:  Echo 05/23/18 Mild conc LVH, EF 45-50, diff HK, normal RVSF  Cardiac Catheterization 05/22/18 LAD prox 50, mid 80, dist 70; D1 ost 95 LCx mid 90, dist 80; OM2 95 RCA mid 30, dist 30 EF 25-35, diff HK involving mid distal anterolateral wall and apex extending to mid distal portion of inf wall RECOMMENDATION: Angiograms were reviewed with colleagues.  In comparison to the prior study of 2013, there is now significant progression of CAD with diffuse stenoses in the diagonal vessel mid distal LAD, distal circumflex marginal, mid distal AV groove circumflex vessels.  Due to the distal disease CABG revascularization is not favorable.  The patient will be gently hydrated post procedure.  She will be started on aggressive medical management initially and if medical therapy fails consider staged PCI.  The patient will be started on carvedilol, nitrates, consider amlodipine and possible ranolazine.  If she is not felt to be an operative candidate dual antiplatelet therapy will be beneficial.   Echo 02/07/17 EF 50-55, no RWMA, Gr 1 DD, normal RVSF  Echo 05/01/15 Mild LVH, EF 55-60, no RWMA, Gr 1 DD  Echocardiogram 7/13:  limited study, EF 35-40%.   LHC 7/13:  Mid LAD 50% within a myocardial bridge, EF 40-45%.  Past  Medical History:  Diagnosis Date  . Adhesive capsulitis of shoulder    bilateral, Steroid injection Dr. Truman Hayward 1/12 bilaterally  . CAD (coronary artery disease)    nonobstructive. Last cardiac cath (2008) showing left circumflex with mid 50% stenosis and distal luminal irregularities. Also with RCA with mid to distal 30-40% stenosis. // Previously evaluated by [name], never followed up outpatient.  Marland Kitchen CAP (community acquired pneumonia) 06/15/2012   05/2012 CXR: Mild opacification of the  posterior lung base on the lateral film  as cannot exclude infection/atelectasis.    . CHF (congestive heart failure) ([place])   . Diabetes mellitus 2007   Type II, insulin dependent  . Diabetic retinopathy   . GERD (gastroesophageal reflux disease)   . Glaucoma   . Hyperlipidemia   . Hypertension   . Obesity   . Peripheral neuropathy    Surgical Hx: The patient  has a past surgical history that includes Cataract extraction; Glaucoma surgery; Cardiac catheterization; Tubal ligation; left heart catheterization with coronary angiogram (N/A, 01/24/2012); Posterior cervical fusion/foraminotomy (N/A, 10/22/2015); Appendectomy; Anterior cervical decomp/discectomy fusion (N/A, 06/16/2016); and LEFT HEART CATH AND CORONARY ANGIOGRAPHY (N/A, 05/22/2018).   Current Medications: Current Meds  Medication Sig  . amitriptyline (ELAVIL) 25 MG tablet Take 1 tablet (25 mg total) by mouth at bedtime.  Marland Kitchen aspirin EC 81 MG EC tablet Take 1 tablet (81 mg total) by mouth daily.  Marland Kitchen atorvastatin (LIPITOR) 80 MG tablet Take 1 tablet (80 mg total) by mouth daily at 6 PM.  . clopidogrel (PLAVIX) 75 MG tablet Take 1 tablet (75 mg total) by mouth daily.  . COMBIGAN 0.2-0.5 % ophthalmic solution Place 1 drop into both eyes 2 (two) times daily.  . Continuous Blood Gluc Receiver (FREESTYLE LIBRE 14 DAY READER) DEVI 1 each by Does not apply route 4 (four) times daily.  . Continuous Blood Gluc Sensor (FREESTYLE LIBRE 14 DAY SENSOR) MISC 1 each by Does not apply route QID.  Marland Kitchen cyclobenzaprine (FLEXERIL) 10 MG tablet TAKE 1 TABLET(10 MG) BY MOUTH TWICE DAILY AS NEEDED FOR MUSCLE SPASMS  . diclofenac sodium (VOLTAREN) 1 % GEL Apply 2 g topically 3 (three) times daily as needed.  . dorzolamide (TRUSOPT) 2 % ophthalmic solution Place 1 drop into both eyes 2 (two) times daily.  . fluticasone (FLONASE) 50 MCG/ACT nasal spray Place 1 spray into both nostrils daily.  . furosemide (LASIX) 20 MG tablet Take 1 tablet (20 mg total) by mouth  daily.  Marland Kitchen gabapentin (NEURONTIN) 300 MG capsule Take 1 capsule (300 mg total) by mouth 3 (three) times daily.  Marland Kitchen glucose blood (ONETOUCH VERIO) test strip USE TO CHECK BLOOD SUGAR 4 TIMES DAILY AS DIRECTED  . Insulin Pen Needle 32G X 4 MM MISC Use to inject insulin up to 4 times daily  . IRON PO Take 1 tablet by mouth 3 (three) times daily.  . metFORMIN (GLUCOPHAGE) 1000 MG tablet TAKE 1 TABLET BY MOUTH TWICE DAILY WITH A MEAL  . mupirocin ointment (BACTROBAN) 2 % Apply 1 application topically daily.  Marland Kitchen NOVOLOG FLEXPEN 100 UNIT/ML FlexPen INJECT 12 UNITS UNDER THE SKIN THREE TIMES DAILY WITH MEALS  . ONETOUCH DELICA LANCETS FINE MISC Check blood sugar as instructed up to 3 times a day  . oxyCODONE-acetaminophen (PERCOCET/ROXICET) 5-325 MG tablet Take 1 tablet by mouth every 8 (eight) hours as needed for severe pain.  . pantoprazole (PROTONIX) 40 MG tablet Take 1 tablet (40 mg total) by mouth daily.  . polyethylene glycol (MIRALAX /  GLYCOLAX) packet Take 17 g by mouth daily.  Marland Kitchen senna (SENOKOT) 8.6 MG TABS tablet Take 2 tablets (17.2 mg total) by mouth daily.  . TRESIBA FLEXTOUCH 200 UNIT/ML SOPN INJECT 30 UNITS INTO THE SKIN DAILY  . VICTOZA 18 MG/3ML SOPN INJECT 1.2 MG TOTAL INTO THE SKIN DAILY  . [DISCONTINUED] isosorbide mononitrate (IMDUR) 30 MG 24 hr tablet Take 1 tablet (30 mg total) by mouth daily.  . [DISCONTINUED] lisinopril (PRINIVIL,ZESTRIL) 20 MG tablet TAKE 1 TABLET(20 MG) BY MOUTH DAILY  . [DISCONTINUED] metoprolol tartrate (LOPRESSOR) 50 MG tablet Take 1 tablet (50 mg total) by mouth 2 (two) times daily.     Allergies:   Patient has no known allergies.   Social History   Tobacco Use  . Smoking status: Former Smoker    Packs/day: 0.30    Years: 7.00    Pack years: 2.10    Types: Cigarettes    Last attempt to quit: 06/27/2006    Years since quitting: 11.9  . Smokeless tobacco: Never Used  Substance Use Topics  . Alcohol use: No    Alcohol/week: 0.0 standard drinks  .  Drug use: No     Family Hx: The patient's family history includes Heart disease in her mother; Hypertension in her sister, sister, and sister. There is no history of Heart attack or Stroke.  ROS:   Please see the history of present illness.    Review of Systems  Constitution: Positive for malaise/fatigue.  Cardiovascular: Positive for chest pain.  Respiratory: Positive for shortness of breath.   Musculoskeletal: Positive for back pain and joint pain.  Gastrointestinal: Positive for abdominal pain and diarrhea.  Neurological: Positive for dizziness and loss of balance.   All other systems reviewed and are negative.   EKGs/Labs/Other Test Reviewed:    EKG:  EKG is  ordered today.  The ekg ordered today demonstrates normal sinus rhythm, heart rate 77, normal axis, T wave inversion 1, aVL, QTC 420 ms, similar to prior tracing  Recent Labs: 05/21/2018: B Natriuretic Peptide 30.9 05/23/2018: Hemoglobin 9.6; Platelets 241 06/05/2018: BUN 11; Creatinine, Ser 1.16; Potassium 4.7; Sodium 143   Recent Lipid Panel Lab Results  Component Value Date/Time   CHOL 156 05/21/2018 06:00 PM   CHOL 222 (H) 02/17/2017 04:33 PM   TRIG 141 05/21/2018 06:00 PM   HDL 38 (L) 05/21/2018 06:00 PM   HDL 54 02/17/2017 04:33 PM   CHOLHDL 4.1 05/21/2018 06:00 PM   LDLCALC 90 05/21/2018 06:00 PM   LDLCALC 119 (H) 02/17/2017 04:33 PM    Physical Exam:    VS:  BP (!) 98/40   Pulse 77   Ht 5\' 6"  (1.676 m)   Wt 268 lb 12.8 oz (121.9 kg)   LMP 02/13/2011   SpO2 98%   BMI 43.39 kg/m     Wt Readings from Last 3 Encounters:  06/05/18 268 lb 12.8 oz (121.9 kg)  05/24/18 260 lb 3.2 oz (118 kg)  02/21/18 264 lb 12.8 oz (120.1 kg)     Physical Exam  Constitutional: She is oriented to person, place, and time. She appears well-developed and well-nourished. No distress.  HENT:  Head: Normocephalic and atraumatic.  Eyes: No scleral icterus.  Neck: No JVD present. No thyromegaly present.  Cardiovascular:  Normal rate and regular rhythm.  No murmur heard. Pulmonary/Chest: Effort normal. She has no rales.  Abdominal: Soft. She exhibits no distension.  Musculoskeletal: She exhibits edema (trace bilat LE edema).  Right wrist without hematoma  Lymphadenopathy:    She has no cervical adenopathy.  Neurological: She is alert and oriented to person, place, and time.  Skin: Skin is warm and dry.  Psychiatric: She has a normal mood and affect.    ASSESSMENT & PLAN:    Non-ST elevation (NSTEMI) myocardial infarction [name]) She presents for follow-up after recent non-ST elevation myocardial infarction.  Cardiac catheterization demonstrated diffuse small vessel and distal vessel disease.  The culprit was felt to be the distal OM1 or the first diagonal.  Both vessels are small in caliber and are not likely amenable to PCI.  Her anatomy is also not amenable to CABG.  Aggressive medical therapy has been recommended.  She is feeling better with less anginal symptoms since starting on metoprolol and isosorbide.  Her blood pressure is running somewhat low.  I will adjust her ACE inhibitor so that we can further adjust her antianginal medications.  If blood pressure becomes a limiting factor, we could consider ranolazine.  -Continue aspirin, Plavix, atorvastatin  -Reduce lisinopril to 5 mg daily  -Increase isosorbide to 60 mg daily  -Increase metoprolol tartrate to 75 mg twice daily  -Obtain follow-up BMET today  -Follow-up with me in 4 to 6 weeks  Chronic combined systolic and diastolic heart failure (HCC) EF 45-50 by echocardiogram during most recent hospitalization.  Volume status currently appears stable.  Continue beta-blocker, ACE inhibitor.  Hopefully, we will not have to discontinue her ACE inhibitor due to hypotension.  Essential hypertension As noted, blood pressure is running somewhat low.  Reduce lisinopril to 5 mg daily as outlined above.  Mixed hyperlipidemia Continue high-dose statin therapy.   Arrange follow-up lipids and LFTs.  Type 2 diabetes mellitus with complication, with long-term current use of insulin ([place]) Managed by primary care.  Empagliflozin could be considered given the results of the EMPA-REG OUTCOME trial.   Lung Nodule Follow up with primary care for further testing.   Dispo:  Return in about 4 weeks (around 07/03/2018) for Close Follow Up, w/ Richardson Dopp, PA-C.   Medication Adjustments/Labs and Tests Ordered: Current medicines are reviewed at length with the patient today.  Concerns regarding medicines are outlined above.  Tests Ordered: Orders Placed This Encounter  Procedures  . Basic metabolic panel  . Hepatic function panel  . Lipid panel  . EKG 12-Lead   Medication Changes: Meds ordered this encounter  Medications  . lisinopril (PRINIVIL,ZESTRIL) 5 MG tablet    Sig: Take 1 tablet (5 mg total) by mouth daily.    Dispense:  90 tablet    Refill:  3  . metoprolol tartrate (LOPRESSOR) 50 MG tablet    Sig: Take 1.5 tablets (75 mg total) by mouth 2 (two) times daily.    Dispense:  270 tablet    Refill:  3  . isosorbide mononitrate (IMDUR) 60 MG 24 hr tablet    Sig: Take 1 tablet (60 mg total) by mouth daily.    Dispense:  90 tablet    Refill:  3    Signed, Richardson Dopp, PA-C  06/05/2018 4:40 PM    [facility] [address], [place], [place]  [address] Phone: [phone]; Fax: [phone]

## 2018-06-05 NOTE — Patient Instructions (Signed)
Medication Instructions:  Your physician has recommended you make the following change in your medication: 1. DECREASE LISINOPRIL TO 5 MG DAILY. 2. INCREASE METOPROLOL TO 75 MG (1.5 TABLET) TWICE DAILY. 3. INCREASE IMDUR TO 60 MG DAILY.   If you need a refill on your cardiac medications before your next appointment, please call your pharmacy.   Lab work: TODAY: BMET  TO BE DONE 6 WEEKS: LFTS, LIPIDS  If you have labs (blood work) drawn today and your tests are completely normal, you will receive your results only by: Marland Kitchen MyChart Message (if you have MyChart) OR . A paper copy in the mail If you have any lab test that is abnormal or we need to change your treatment, we will call you to review the results.  Testing/Procedures: NONE  Follow-Up: Your physician recommends that you schedule a follow-up appointment WITH SCOTT WEAVER, PA-C ON 1/21 @ 9:45 AM.   Any Other Special Instructions Will Be Listed Below (If Applicable). CHECK BLOOD PRESSURE ONCE DAILY. IF YOUR SYSTOLIC BLOOD PRESSURE IS 100 OR LESS AND YOU  FEEL DIZZY PLEASE CALL OUR OFFICE AT [phone].

## 2018-06-19 ENCOUNTER — Emergency Department (HOSPITAL_COMMUNITY): Payer: Medicare HMO

## 2018-06-19 ENCOUNTER — Emergency Department (HOSPITAL_COMMUNITY)
Admission: EM | Admit: 2018-06-19 | Discharge: 2018-06-19 | Disposition: A | Discharge status: Home / Self Care | Payer: Medicare HMO | Attending: Emergency Medicine | Admitting: Emergency Medicine

## 2018-06-19 DIAGNOSIS — Z7902 Long term (current) use of antithrombotics/antiplatelets: Secondary | ICD-10-CM | POA: Insufficient documentation

## 2018-06-19 DIAGNOSIS — Z794 Long term (current) use of insulin: Secondary | ICD-10-CM | POA: Diagnosis not present

## 2018-06-19 DIAGNOSIS — I5042 Chronic combined systolic (congestive) and diastolic (congestive) heart failure: Secondary | ICD-10-CM | POA: Diagnosis not present

## 2018-06-19 DIAGNOSIS — Z79899 Other long term (current) drug therapy: Secondary | ICD-10-CM | POA: Diagnosis not present

## 2018-06-19 DIAGNOSIS — Z7982 Long term (current) use of aspirin: Secondary | ICD-10-CM | POA: Diagnosis not present

## 2018-06-19 DIAGNOSIS — Z87891 Personal history of nicotine dependence: Secondary | ICD-10-CM | POA: Diagnosis not present

## 2018-06-19 DIAGNOSIS — E119 Type 2 diabetes mellitus without complications: Secondary | ICD-10-CM | POA: Insufficient documentation

## 2018-06-19 DIAGNOSIS — R0989 Other specified symptoms and signs involving the circulatory and respiratory systems: Secondary | ICD-10-CM | POA: Diagnosis not present

## 2018-06-19 DIAGNOSIS — I110 Hypertensive heart disease with heart failure: Secondary | ICD-10-CM | POA: Insufficient documentation

## 2018-06-19 DIAGNOSIS — I252 Old myocardial infarction: Secondary | ICD-10-CM | POA: Insufficient documentation

## 2018-06-19 DIAGNOSIS — E785 Hyperlipidemia, unspecified: Secondary | ICD-10-CM | POA: Insufficient documentation

## 2018-06-19 DIAGNOSIS — R198 Other specified symptoms and signs involving the digestive system and abdomen: Secondary | ICD-10-CM

## 2018-06-19 DIAGNOSIS — J029 Acute pharyngitis, unspecified: Secondary | ICD-10-CM

## 2018-06-19 DIAGNOSIS — I2510 Atherosclerotic heart disease of native coronary artery without angina pectoris: Secondary | ICD-10-CM | POA: Diagnosis not present

## 2018-06-19 LAB — BASIC METABOLIC PANEL
Anion gap: 12 (ref 5–15)
BUN: 20 mg/dL (ref 6–20)
CO2: 26 mmol/L (ref 22–32)
Calcium: 9.3 mg/dL (ref 8.9–10.3)
Chloride: 99 mmol/L (ref 98–111)
Creatinine, Ser: 1.72 mg/dL — ABNORMAL HIGH (ref 0.44–1.00)
GFR calc Af Amer: 37 mL/min — ABNORMAL LOW (ref >60)
GFR calc non Af Amer: 32 mL/min — ABNORMAL LOW (ref >60)
Glucose, Bld: 144 mg/dL — ABNORMAL HIGH (ref 70–99)
Potassium: 3.6 mmol/L (ref 3.5–5.1)
Sodium: 137 mmol/L (ref 135–145)

## 2018-06-19 LAB — CBC WITH DIFFERENTIAL/PLATELET
Abs Immature Granulocytes: 0.11 10*3/uL — ABNORMAL HIGH (ref 0.00–0.07)
Basophils Absolute: 0 10*3/uL (ref 0.0–0.1)
Basophils Relative: 0 %
Eosinophils Absolute: 0.2 10*3/uL (ref 0.0–0.5)
Eosinophils Relative: 1 %
HCT: 33 % — ABNORMAL LOW (ref 36.0–46.0)
Hemoglobin: 10.3 g/dL — ABNORMAL LOW (ref 12.0–15.0)
Immature Granulocytes: 1 %
Lymphocytes Relative: 22 %
Lymphs Abs: 3.9 10*3/uL (ref 0.7–4.0)
MCH: 27.9 pg (ref 26.0–34.0)
MCHC: 31.2 g/dL (ref 30.0–36.0)
MCV: 89.4 fL (ref 80.0–100.0)
Monocytes Absolute: 1.1 10*3/uL — ABNORMAL HIGH (ref 0.1–1.0)
Monocytes Relative: 6 %
Neutro Abs: 12.7 10*3/uL — ABNORMAL HIGH (ref 1.7–7.7)
Neutrophils Relative %: 70 %
Platelets: 327 10*3/uL (ref 150–400)
RBC: 3.69 MIL/uL — ABNORMAL LOW (ref 3.87–5.11)
RDW: 13.6 % (ref 11.5–15.5)
WBC: 18 10*3/uL — ABNORMAL HIGH (ref 4.0–10.5)
nRBC: 0 % (ref 0.0–0.2)

## 2018-06-19 LAB — I-STAT TROPONIN, ED: Troponin i, poc: 0.06 ng/mL (ref 0.00–0.08)

## 2018-06-19 LAB — GROUP A STREP BY PCR: Group A Strep by PCR: NOT DETECTED

## 2018-06-19 MED ORDER — DIPHENHYDRAMINE HCL 25 MG PO TABS: 25.0000 mg | ORAL_TABLET | Freq: Four times a day (QID) | ORAL | 0 refills | Status: AC | PRN

## 2018-06-19 MED ORDER — FAMOTIDINE IN NACL 20-0.9 MG/50ML-% IV SOLN
20.0000 mg | Freq: Once | INTRAVENOUS | Status: AC
  Administered 2018-06-19: 20 mg via INTRAVENOUS
  Filled 2018-06-19: qty 50

## 2018-06-19 MED ORDER — METHYLPREDNISOLONE SODIUM SUCC 125 MG IJ SOLR
125.0000 mg | Freq: Once | INTRAMUSCULAR | Status: AC
  Administered 2018-06-19: 125 mg via INTRAVENOUS
  Filled 2018-06-19: qty 2

## 2018-06-19 MED ORDER — FAMOTIDINE 20 MG PO TABS: 20.0000 mg | ORAL_TABLET | Freq: Two times a day (BID) | ORAL | 0 refills | Status: DC | PRN

## 2018-06-19 MED ORDER — ALUM & MAG HYDROXIDE-SIMETH 200-200-20 MG/5ML PO SUSP
30.0000 mL | Freq: Once | ORAL | Status: AC
  Administered 2018-06-19: 30 mL via ORAL
  Filled 2018-06-19: qty 30

## 2018-06-19 MED ORDER — SODIUM CHLORIDE 0.9 % IV BOLUS
500.0000 mL | Freq: Once | INTRAVENOUS | Status: AC
  Administered 2018-06-19: 500 mL via INTRAVENOUS

## 2018-06-19 MED ORDER — DIPHENHYDRAMINE HCL 50 MG/ML IJ SOLN
25.0000 mg | Freq: Once | INTRAMUSCULAR | Status: AC
  Administered 2018-06-19: 25 mg via INTRAVENOUS
  Filled 2018-06-19: qty 1

## 2018-06-19 MED ORDER — FAMOTIDINE 20 MG PO TABS: 20.0000 mg | ORAL_TABLET | Freq: Once | ORAL | Status: DC

## 2018-06-19 MED ORDER — PREDNISONE 10 MG PO TABS: 40.0000 mg | ORAL_TABLET | Freq: Every day | ORAL | 0 refills | Status: AC

## 2018-06-19 NOTE — ED Notes (Signed)
Patient transported to X-ray 

## 2018-06-19 NOTE — ED Triage Notes (Addendum)
Pt endorses throat swelling starting at 1600 today. Pt reports she had some throat swelling yesterday, but reports it went down. Pt reports she was started on 6 new medications 3 weeks ago. Pt reports she has been on lisinopril for longer than that, but they lowered the dose. Pt reports last dose of lisinopril a week ago. Pt reports taking the metropolol today. Pt reports hasn't eaten in 5 days due to difficulty swallowing. Pt endorses it is difficult to get a deep breath starting today. Pt denies lip/throat swelling. Pt reports hives "all over." Pt changing story frequently. Pt stating not able to swallow, but took some pills. Pt first stated throat swelling began today, but then stated it was a week ago. Unable to get a clear story on which medicines she took today.

## 2018-06-19 NOTE — ED Provider Notes (Signed)
[place] EMERGENCY DEPARTMENT Provider Note   CSN: [id number] Arrival date & time: 06/19/18  1801     History   Chief Complaint Chief Complaint  Patient presents with  . Oral Swelling    HPI [name] is a 57 y.o. female with history of CAD, CHF, diabetes mellitus, HLD, HTN, obesity, peripheral neuropathy, recent admission for NSTEMI presents for evaluation of gradual onset, progressively worsening throat tightness and dysphagia for approximately 5 days.  Recently admitted to the hospital and discharged on 05/24/2018 for NSTEMI and was started on several new medications at that time.  During her stay, she underwent cardiac catheterization and echo cardiogram and cardiology felt that she was best suited for medical management for her coronary artery disease.  She followed up with her cardiologist on 06/05/2018 who noted she was somewhat hypotensive and decreased her lisinopril which she has been on for several years from 20 mg to 5 mg daily.  They also increased her Imdur dose to 60 mg daily and her metoprolol dose to 75 mg twice daily.  She has been taking these medications but she is a poor historian and cannot tell me the last time she took her lisinopril.  She notes that over the last few days she has felt a sensation of throat tightness, drooling, and feels as though she needs to spit up her food when she eats.  She notes odynophagia as well but denies fevers.  She feels somewhat short of breath and notes that her chest pain "is still there coming and going but it is better ".  She cannot elaborate.  Denies voice changes.  Has not tried anything for her symptoms.  She denies any new soaps, shampoos, detergents, or lotions.  Endorses intermittent hives "all over "but I do not see any presently on exam.   The history is provided by the patient and medical records.    Past Medical History:  Diagnosis Date  . Adhesive capsulitis of shoulder    bilateral, Steroid  injection Dr. Truman Hayward 1/12 bilaterally  . CAD (coronary artery disease)    nonobstructive. Last cardiac cath (2008) showing left circumflex with mid 50% stenosis and distal luminal irregularities. Also with RCA with mid to distal 30-40% stenosis. // Previously evaluated by [facility], never followed up outpatient.  Marland Kitchen CAP (community acquired pneumonia) 06/15/2012   05/2012 CXR: Mild opacification of the posterior lung base on the lateral film  as cannot exclude infection/atelectasis.    . CHF (congestive heart failure) ([place])   . Diabetes mellitus 2007   Type II, insulin dependent  . Diabetic retinopathy   . GERD (gastroesophageal reflux disease)   . Glaucoma   . Hyperlipidemia   . Hypertension   . Obesity   . Peripheral neuropathy     Patient Active Problem List   Diagnosis Date Noted  . Non-ST elevation (NSTEMI) myocardial infarction ([place])   . Shortness of breath 05/21/2018  . Chronic lower back pain 02/21/2018  . Chronic pain of right knee 01/23/2018  . Cough 08/23/2017  . Rib pain on right side 06/28/2017  . Lateral pain of right hip 04/27/2017  . Right foot ulcer ([place]) 10/26/2016  . Constipation 09/30/2016  . History of colonic polyps 09/30/2016  . Right foot pain 07/08/2016  . Non-alcoholic fatty liver disease 05/13/2016  . Spinal stenosis in cervical region 09/29/2015  . Bilateral low back pain with sciatica   . Long-term current use of opiate analgesic 07/11/2014  . Onychomycosis  10/29/2013  . Severe nonproliferative diabetic retinopathy ([place]) 04/04/2013  . Primary open angle glaucoma 04/04/2013  . Breast cancer screening 12/05/2012  . Pain and swelling of left knee 10/29/2012  . Insomnia 06/15/2012  . Chronic combined systolic and diastolic heart failure ([place]) 01/30/2012  . CAD (coronary artery disease) 11/02/2011  . Preventative health care 07/13/2011  . Adhesive capsulitis of right shoulder 11/16/2010  . Gastroesophageal reflux disease 10/12/2009  .  Uncontrolled type 2 diabetes with neuropathy ([place]) 06/12/2006  . Hyperlipidemia 06/12/2006  . OBESITY 06/12/2006  . Essential hypertension 06/12/2006    Past Surgical History:  Procedure Laterality Date  . ANTERIOR CERVICAL DECOMP/DISCECTOMY FUSION N/A 06/16/2016   Procedure: Anterior Cervical Decompression/discectomy Fusion - Cervical six - Cervical seven;  Surgeon: Eustace Moore, MD;  Location: [place];  Service: Neurosurgery;  Laterality: N/A;  Anterior Cervical Decompression/discectomy Fusion - Cervical six - Cervical seven  . APPENDECTOMY    . CARDIAC CATHETERIZATION    . CATARACT EXTRACTION    . GLAUCOMA SURGERY    . LEFT HEART CATH AND CORONARY ANGIOGRAPHY N/A 05/22/2018   Procedure: LEFT HEART CATH AND CORONARY ANGIOGRAPHY;  Surgeon: Troy Sine, MD;  Location: [place] CV LAB;  Service: Cardiovascular;  Laterality: N/A;  . LEFT HEART CATHETERIZATION WITH CORONARY ANGIOGRAM N/A 01/24/2012   Procedure: LEFT HEART CATHETERIZATION WITH CORONARY ANGIOGRAM;  Surgeon: Sherren Mocha, MD;  Location: [facility] CATH LAB;  Service: Cardiovascular;  Laterality: N/A;  . POSTERIOR CERVICAL FUSION/FORAMINOTOMY N/A 10/22/2015   Procedure: Cervical three-Cervical Seven Posterior cervical fusion with lateral mass fixation,  Laminectomy Cervical Three-Cervical Seven;  Surgeon: Eustace Moore, MD;  Location: [place] NEURO ORS;  Service: Neurosurgery;  Laterality: N/A;  Posterior  . TUBAL LIGATION       OB History   No obstetric history on file.      Home Medications    Prior to Admission medications   Medication Sig Start Date End Date Taking? Authorizing Provider  amitriptyline (ELAVIL) 25 MG tablet Take 1 tablet (25 mg total) by mouth at bedtime. 01/10/18  Yes Axel Filler, MD  aspirin EC 81 MG EC tablet Take 1 tablet (81 mg total) by mouth daily. 05/01/15  Yes Strader, [place] M, PA-C  atorvastatin (LIPITOR) 80 MG tablet Take 1 tablet (80 mg total) by mouth daily at 6 PM. 05/24/18 08/22/18  Yes Bloomfield, Carley D, DO  clopidogrel (PLAVIX) 75 MG tablet Take 1 tablet (75 mg total) by mouth daily. 05/24/18 08/22/18 Yes Bloomfield, Carley D, DO  COMBIGAN 0.2-0.5 % ophthalmic solution Place 1 drop into both eyes 2 (two) times daily. 03/22/18  Yes [provider]  cyclobenzaprine (FLEXERIL) 10 MG tablet TAKE 1 TABLET(10 MG) BY MOUTH TWICE DAILY AS NEEDED FOR MUSCLE SPASMS Patient taking differently: Take 10 mg by mouth 2 (two) times daily as needed for muscle spasms.  05/07/18  Yes Agyei, Caprice Kluver, MD  diclofenac sodium (VOLTAREN) 1 % GEL Apply 2 g topically 3 (three) times daily as needed. 01/23/18  Yes Dorrell, Andree Elk, MD  dorzolamide (TRUSOPT) 2 % ophthalmic solution Place 1 drop into both eyes 2 (two) times daily. 03/22/18  Yes [provider]  fluticasone (FLONASE) 50 MCG/ACT nasal spray Place 1 spray into both nostrils daily. 11/10/17 11/10/18 Yes Rice, Resa Miner, MD  furosemide (LASIX) 20 MG tablet Take 1 tablet (20 mg total) by mouth daily. 03/08/18  Yes Agyei, Caprice Kluver, MD  gabapentin (NEURONTIN) 300 MG capsule Take 1 capsule (300 mg total)  by mouth 3 (three) times daily. 01/10/18  Yes Axel Filler, MD  IRON PO Take 1 tablet by mouth 3 (three) times daily.   Yes [provider]  isosorbide mononitrate (IMDUR) 60 MG 24 hr tablet Take 1 tablet (60 mg total) by mouth daily. 06/05/18 09/03/18 Yes Weaver, Scott T, PA-C  lisinopril (PRINIVIL,ZESTRIL) 5 MG tablet Take 1 tablet (5 mg total) by mouth daily. 06/05/18 09/03/18 Yes Weaver, Scott T, PA-C  metFORMIN (GLUCOPHAGE) 1000 MG tablet TAKE 1 TABLET BY MOUTH TWICE DAILY WITH A MEAL Patient taking differently: Take 1,000 mg by mouth 2 (two) times daily with a meal.  05/01/18  Yes Agyei, Obed K, MD  metoprolol tartrate (LOPRESSOR) 50 MG tablet Take 1.5 tablets (75 mg total) by mouth 2 (two) times daily. 06/05/18 09/03/18 Yes Weaver, Scott T, PA-C  mupirocin ointment (BACTROBAN) 2 % Apply 1 application topically  daily. 05/10/18  Yes [provider]  NOVOLOG FLEXPEN 100 UNIT/ML FlexPen INJECT 12 UNITS UNDER THE SKIN THREE TIMES DAILY WITH MEALS Patient taking differently: Inject 12 Units into the skin 3 (three) times daily with meals.  04/12/18  Yes Oda Kilts, MD  oxyCODONE-acetaminophen (PERCOCET/ROXICET) 5-325 MG tablet Take 1 tablet by mouth every 8 (eight) hours as needed for severe pain. 04/27/18  Yes Aldine Contes, MD  pantoprazole (PROTONIX) 40 MG tablet Take 1 tablet (40 mg total) by mouth daily. 03/08/18  Yes Agyei, Caprice Kluver, MD  polyethylene glycol (MIRALAX / GLYCOLAX) packet Take 17 g by mouth daily. 09/23/16  Yes Domenic Moras, PA-C  senna (SENOKOT) 8.6 MG TABS tablet Take 2 tablets (17.2 mg total) by mouth daily. 09/30/16  Yes Rice, Resa Miner, MD  TRESIBA FLEXTOUCH 200 UNIT/ML SOPN INJECT 30 UNITS INTO THE SKIN DAILY Patient taking differently: Inject 30 Units into the skin daily.  04/06/18  Yes Agyei, Obed Raliegh Ip, MD  VICTOZA 18 MG/3ML SOPN INJECT 1.2 MG TOTAL INTO THE SKIN DAILY Patient taking differently: Inject 1.2 mg into the skin daily.  12/13/17  Yes Annia Belt, MD  Continuous Blood Gluc Receiver (FREESTYLE LIBRE 14 DAY READER) DEVI 1 each by Does not apply route 4 (four) times daily. 12/26/17   Lucious Groves, DO  Continuous Blood Gluc Sensor (FREESTYLE LIBRE 14 DAY SENSOR) MISC 1 each by Does not apply route QID. 12/26/17   Lucious Groves, DO  diphenhydrAMINE (BENADRYL) 25 MG tablet Take 1 tablet (25 mg total) by mouth every 6 (six) hours as needed for itching or allergies. 06/19/18   [place]akin[place] Andy A, PA-C  famotidine (PEPCID) 20 MG tablet Take 1 tablet (20 mg total) by mouth 2 (two) times daily as needed for indigestion (throat tightness). 06/19/18   Chalise Pe A, PA-C  glucose blood (ONETOUCH VERIO) test strip USE TO CHECK BLOOD SUGAR 4 TIMES DAILY AS DIRECTED 01/30/18   Axel Filler, MD  Insulin Pen Needle 32G X 4 MM MISC Use to inject insulin up to 4 times  daily 01/29/18   Jean Rosenthal, MD  [facility] DELICA LANCETS FINE MISC Check blood sugar as instructed up to 3 times a day 03/14/16   Collier [place], MD  predniSONE (DELTASONE) 10 MG tablet Take 4 tablets (40 mg total) by mouth daily with breakfast for 4 days. 06/19/18 06/23/18  Renita Papa, PA-C    Family History Family History  Problem Relation Age of Onset  . Heart disease Mother   . Hypertension Sister   . Hypertension Sister   .  Hypertension Sister   . Heart attack Neg Hx   . Stroke Neg Hx     Social History Social History   Tobacco Use  . Smoking status: Former Smoker    Packs/day: 0.30    Years: 7.00    Pack years: 2.10    Types: Cigarettes    Last attempt to quit: 06/27/2006    Years since quitting: 11.9  . Smokeless tobacco: Never Used  Substance Use Topics  . Alcohol use: No    Alcohol/week: 0.0 standard drinks  . Drug use: No     Allergies   Patient has no known allergies.   Review of Systems Review of Systems  Constitutional: Negative for fever.  HENT: Positive for drooling, sore throat and trouble swallowing. Negative for facial swelling and voice change.   Respiratory: Positive for shortness of breath.   Cardiovascular: Positive for chest pain.  Skin: Positive for rash.  All other systems reviewed and are negative.    Physical Exam Updated Vital Signs BP 123/75   Pulse 87   Temp 97.6 F (36.4 C) (Oral)   Resp 20   Ht 5\' 6"  (1.676 m)   Wt 105.2 kg   LMP 02/13/2011   SpO2 98%   BMI 37.45 kg/m   Physical Exam Vitals signs and nursing note reviewed.  Constitutional:      General: She is not in acute distress.    Appearance: She is well-developed.  HENT:     Head: Normocephalic and atraumatic.     Comments: No angioedema noted.  No subglossal swelling.  Mucous membranes are dry.  She is tolerating secretions without difficulty.  She was also able to sip water without difficulty.  No upper airway stridor.  No facial swelling noted  otherwise.  No abnormal phonation.  Posterior pharynx without erythema, tonsillar hypertrophy, exudates, uvular deviation, trismus, or sublingual abnormalities.    Mouth/Throat:     Mouth: Mucous membranes are dry.  Eyes:     General:        Right eye: No discharge.        Left eye: No discharge.     Conjunctiva/sclera: Conjunctivae normal.  Neck:     Musculoskeletal: Normal range of motion and neck supple. No neck rigidity or muscular tenderness.     Vascular: No JVD.     Trachea: No tracheal deviation.  Cardiovascular:     Rate and Rhythm: Normal rate.     Pulses: Normal pulses.  Pulmonary:     Effort: Pulmonary effort is normal.     Breath sounds: Normal breath sounds.  Abdominal:     General: Abdomen is flat. There is no distension.     Tenderness: There is no abdominal tenderness.  Skin:    General: Skin is warm and dry.     Findings: No erythema.  Neurological:     Mental Status: She is alert. Mental status is at baseline.  Psychiatric:        Behavior: Behavior normal.      ED Treatments / Results  Labs (all labs ordered are listed, but only abnormal results are displayed) Labs Reviewed  BASIC METABOLIC PANEL - Abnormal; Notable for the following components:      Result Value   Glucose, Bld 144 (*)    Creatinine, Ser 1.72 (*)    GFR calc non Af Amer 32 (*)    GFR calc Af Amer 37 (*)    All other components within normal limits  CBC WITH  DIFFERENTIAL/PLATELET - Abnormal; Notable for the following components:   WBC 18.0 (*)    RBC 3.69 (*)    Hemoglobin 10.3 (*)    HCT 33.0 (*)    Neutro Abs 12.7 (*)    Monocytes Absolute 1.1 (*)    Abs Immature Granulocytes 0.11 (*)    All other components within normal limits  GROUP A STREP BY PCR  I-STAT TROPONIN, ED    EKG EKG Interpretation  Date/Time:  Tuesday June 19 2018 18:55:50 EST Ventricular Rate:  88 PR Interval:    QRS Duration: 92 QT Interval:  354 QTC Calculation: 429 R Axis:   25 Text  Interpretation:  Sinus rhythm Low voltage, precordial leads Borderline repolarization abnormality Confirmed by Virgel Manifold [phone]) on 06/19/2018 7:59:39 PM   Radiology Dg Chest 2 View  Result Date: 06/19/2018 CLINICAL DATA:  Shortness of breath. Coronary artery disease. Congestive heart failure. EXAM: CHEST - 2 VIEW COMPARISON:  05/21/2018 FINDINGS: The heart size and mediastinal contours are within normal limits. Both lungs are clear. Thoracic spine degenerative changes again noted as well as cervical spine fusion hardware. IMPRESSION: Stable exam.  No active cardiopulmonary disease. Electronically Signed   By: Earle Gell M.D.   On: 06/19/2018 20:03    Procedures Procedures (including critical care time)  Medications Ordered in ED Medications  sodium chloride 0.9 % bolus 500 mL (0 mLs Intravenous Stopped 06/19/18 2040)  diphenhydrAMINE (BENADRYL) injection 25 mg (25 mg Intravenous Given 06/19/18 1910)  methylPREDNISolone sodium succinate (SOLU-MEDROL) 125 mg/2 mL injection 125 mg (125 mg Intravenous Given 06/19/18 1911)  famotidine (PEPCID) IVPB 20 mg premix (0 mg Intravenous Stopped 06/19/18 2036)  alum & mag hydroxide-simeth (MAALOX/MYLANTA) 200-200-20 MG/5ML suspension 30 mL (30 mLs Oral Given 06/19/18 2036)     Initial Impression / Assessment and Plan / ED Course  I have reviewed the triage vital signs and the nursing notes.  Pertinent labs & imaging results that were available during my care of the patient were reviewed by me and considered in my medical decision making (see chart for details).    Patient presenting for globus sensation and dysphagia/odynophagia for approximately 5 days.  No evidence of facial swelling.  She is afebrile, vital signs are stable.  She recently had some medication changes and additions after admission for NSTEMI.  She is tolerating secretions without difficulty and phonating normally on my examination.  She is able to sip water without difficulty.   There is no evidence of angioedema or anaphylaxis.  She has no hives on my assessment.  She has a nonspecific leukocytosis on her lab work which was reviewed by me.  CKD with creatinine at her baseline per chart review.  Strep test is negative.  Troponin is negative and EKG is unchanged.  Doubt ACS/MI.  Chest x-ray shows no acute cardiopulmonary abnormalities.  She was given Solu-Medrol, a small fluid bolus, Pepcid, and Benadryl and on reevaluation reports that she is feeling much better.  She does note some ongoing pain with swallowing but denies globus sensation, drooling, or throat tightness.  Suspect possible GERD component to her symptoms and recommended addition of Pepcid to her medication regimen.  We will also discharge with short course of Benadryl and prednisone for possible allergic reaction.  I informed her that the prednisone may cause an increase in her blood sugars and that she should monitor this and discontinue the prednisone if she does note a significant increase.  I also instructed her to discontinue her lisinopril  as her blood pressures have been soft at her last cardiology visit and somewhat soft while in the ED here.  She will call her cardiologist office as soon as possible to set up follow-up appointment and medication management.  Discussed her DD return precautions. Pt verbalized understanding of and agreement with plan and is safe for discharge home at this time.  Discussed with Dr. Wilson Singer who agrees with assessment and plan at this time.  Final Clinical Impressions(s) / ED Diagnoses   Final diagnoses:  Sore throat  Globus sensation    ED Discharge Orders         Ordered    famotidine (PEPCID) 20 MG tablet  2 times daily PRN     06/19/18 2041    diphenhydrAMINE (BENADRYL) 25 MG tablet  Every 6 hours PRN     06/19/18 2041    predniSONE (DELTASONE) 10 MG tablet  Daily with breakfast     06/19/18 2041           Debroah Baller 06/19/18 2044    Virgel Manifold,  MD 06/19/18 2318

## 2018-06-19 NOTE — ED Notes (Signed)
ED Provider at bedside. 

## 2018-06-19 NOTE — ED Notes (Signed)
Patient verbalizes understanding of discharge instructions. Opportunity for questioning and answers were provided. Armband removed by staff, pt discharged from ED. Pt wheeled to lobby. 

## 2018-06-19 NOTE — Discharge Instructions (Addendum)
Your work-up today is reassuring.  Start taking Pepcid twice daily.  This medication helps with allergies and acid reflux.  Start taking prednisone as prescribed beginning tomorrow.  You received the first dose in the emergency department today.  This medication can increase your blood sugars so make sure that you check your blood sugars every day while you are taking it.  If your blood sugars increase significantly, stop taking the prednisone and call your primary care doctor for further recommendations.  You can take Benadryl up to every 6 hours as needed for hives and throat tightness as well.  Stop taking your lisinopril completely.  Call your cardiologist and set up an appointment for as soon as possible for reevaluation of your symptoms and management of your medications.  Continue to monitor your blood pressures at home.  Return to the emergency department immediately if any concerning signs or symptoms develop such as drooling, change in voice, persistent shortness of breath, chest pains, fevers, inability to swallow, or persistent vomiting.

## 2018-07-03 ENCOUNTER — Other Ambulatory Visit: Payer: Self-pay | Admitting: *Deleted

## 2018-07-05 ENCOUNTER — Other Ambulatory Visit: Payer: Self-pay | Admitting: *Deleted

## 2018-07-05 DIAGNOSIS — E1140 Type 2 diabetes mellitus with diabetic neuropathy, unspecified: Secondary | ICD-10-CM

## 2018-07-05 DIAGNOSIS — IMO0002 Reserved for concepts with insufficient information to code with codable children: Secondary | ICD-10-CM

## 2018-07-05 DIAGNOSIS — E1165 Type 2 diabetes mellitus with hyperglycemia: Secondary | ICD-10-CM

## 2018-07-05 NOTE — Telephone Encounter (Signed)
Not a patient at [facility]. 

## 2018-07-17 ENCOUNTER — Other Ambulatory Visit: Payer: Medicare HMO

## 2018-07-17 ENCOUNTER — Encounter: Payer: Self-pay | Admitting: Physician Assistant

## 2018-07-17 ENCOUNTER — Ambulatory Visit (INDEPENDENT_AMBULATORY_CARE_PROVIDER_SITE_OTHER): Payer: Medicare HMO | Admitting: Physician Assistant

## 2018-07-17 VITALS — BP 114/60 | HR 86 | Ht 66.0 in | Wt 263.8 lb

## 2018-07-17 DIAGNOSIS — I5042 Chronic combined systolic (congestive) and diastolic (congestive) heart failure: Secondary | ICD-10-CM

## 2018-07-17 DIAGNOSIS — E785 Hyperlipidemia, unspecified: Secondary | ICD-10-CM | POA: Diagnosis not present

## 2018-07-17 DIAGNOSIS — E782 Mixed hyperlipidemia: Secondary | ICD-10-CM

## 2018-07-17 DIAGNOSIS — I25119 Atherosclerotic heart disease of native coronary artery with unspecified angina pectoris: Secondary | ICD-10-CM | POA: Diagnosis not present

## 2018-07-17 DIAGNOSIS — I10 Essential (primary) hypertension: Secondary | ICD-10-CM

## 2018-07-17 DIAGNOSIS — I214 Non-ST elevation (NSTEMI) myocardial infarction: Secondary | ICD-10-CM

## 2018-07-17 LAB — HEPATIC FUNCTION PANEL
ALT: 12 IU/L (ref 0–32)
AST: 10 IU/L (ref 0–40)
Albumin: 4 g/dL (ref 3.8–4.9)
Alkaline Phosphatase: 93 IU/L (ref 39–117)
Bilirubin Total: <0.2 mg/dL (ref 0.0–1.2)
Bilirubin, Direct: 0.06 mg/dL (ref 0.00–0.40)
Total Protein: 6.7 g/dL (ref 6.0–8.5)

## 2018-07-17 LAB — LIPID PANEL
Chol/HDL Ratio: 2.3 ratio (ref 0.0–4.4)
Cholesterol, Total: 116 mg/dL (ref 100–199)
HDL: 51 mg/dL (ref >39)
LDL Calculated: 48 mg/dL (ref 0–99)
Triglycerides: 83 mg/dL (ref 0–149)
VLDL Cholesterol Cal: 17 mg/dL (ref 5–40)

## 2018-07-17 NOTE — Patient Instructions (Signed)
Medication Instructions:  Your physician recommends that you continue on your current medications as directed. Please refer to the Current Medication list given to you today.  If you need a refill on your cardiac medications before your next appointment, please call your pharmacy.   Lab work: TODAY: LIPIDS, LFTS  If you have labs (blood work) drawn today and your tests are completely normal, you will receive your results only by: Marland Kitchen MyChart Message (if you have MyChart) OR . A paper copy in the mail If you have any lab test that is abnormal or we need to change your treatment, we will call you to review the results.  Testing/Procedures: NONE  Follow-Up: At [facility], you and your health needs are our priority.  As part of our continuing mission to provide you with exceptional heart care, we have created designated Provider Care Teams.  These Care Teams include your primary Cardiologist (physician) and Advanced Practice Providers (APPs -  Physician Assistants and Nurse Practitioners) who all work together to provide you with the care you need, when you need it. You will need a follow up appointment in:  3-4 months.   You may see Sherren Mocha, MDor one of the following Advanced Practice Providers on your designated Care Team: Richardson Dopp, PA-C [place], [place] . Daune Perch, NP  Any Other Special Instructions Will Be Listed Below (If Applicable).

## 2018-07-17 NOTE — Progress Notes (Signed)
Cardiology Office Note:    Date:  07/17/2018   ID:  [name], DOB 1961/03/30, MRN [id number]  PCP:  Tsosie Billing, MD  Cardiologist:  Sherren Mocha, MD  Electrophysiologist:  None   Referring MD: Cristy Friedlander*   Chief Complaint  Patient presents with  . Follow-up    CAD, CHF    History of Present Illness:    [name] is a 58 y.o. female with coronary artery disease, non-ischemic cardiomyopathy, morbid obesity, diabetes, hypertension, hyperlipidemia, lung nodule.   Her EF has been as low as 35-40 in the past but has improved to normal.  She was admitted in 04/2018 with a NSTEMI.  Cardiac Catheterization demonstrated diffuse stenoses in the diagonal vessel, mid distal LAD, distal circumflex marginal, mid distal AV groove circumflex vessels.  Due to the distal disease, CABG revascularization was not felt to be favorable.  Medical therapy was recommended.  PCI could be considered if she fails medical therapy.  She was last seen 06/05/18 for post hospital follow up.   [name] returns for follow-up.  At last visit I adjusted her isosorbide and metoprolol tartrate.  Sometime after this, she developed hives and what sounds like an allergic reaction.  She went to her PCP and received what sounds like Solu-Medrol injection.  She has continued on isosorbide 30 mg twice a day and her metoprolol since that time.  She has not had any recurrent urticaria.  She has not had significant chest discomfort.  She does note shortness of breath with some activities.  This is largely unchanged.  She denies orthopnea, PND.  She does have some mild leg swelling.  This is improved since her primary care doctor increased her Lasix.  She denies syncope.  Prior CV studies:   The following studies were reviewed today:  Echo 05/23/18 Mild conc LVH, EF 45-50, diff HK, normal RVSF  Cardiac Catheterization 05/22/18 LAD prox 50, mid 80, dist 70; D1 ost 95 LCx mid 90, dist 80;  OM2 95 RCA mid 30, dist 30 EF 25-35, diff HK involving mid distal anterolateral wall and apex extending to mid distal portion of inf wall RECOMMENDATION: Angiograms were reviewed with colleagues. In comparison to the prior study of 2013, there is now significant progression of CAD with diffuse stenoses in the diagonal vessel mid distal LAD, distal circumflex marginal, mid distal AV groove circumflex vessels. Due to the distal disease CABG revascularization is not favorable. The patient will be gently hydrated post procedure. She will be started on aggressive medical management initially and if medical therapy fails consider staged PCI. The patient will be started on carvedilol, nitrates, consider amlodipine and possible ranolazine. If she is not felt to be an operative candidate dual antiplatelet therapy will be beneficial.   Echo 02/07/17 EF 50-55, no RWMA, Gr 1 DD, normal RVSF  Echo 05/01/15 Mild LVH, EF 55-60, no RWMA, Gr 1 DD  Echocardiogram 7/13:  limited study, EF 35-40%.   LHC 7/13:  Mid LAD 50% within a myocardial bridge, EF 40-45%.  Past Medical History:  Diagnosis Date  . Adhesive capsulitis of shoulder    bilateral, Steroid injection Dr. Truman Hayward 1/12 bilaterally  . CAD (coronary artery disease)    nonobstructive. Last cardiac cath (2008) showing left circumflex with mid 50% stenosis and distal luminal irregularities. Also with RCA with mid to distal 30-40% stenosis. // Previously evaluated by [facility], never followed up outpatient.  Marland Kitchen CAP (community acquired pneumonia) 06/15/2012   05/2012  CXR: Mild opacification of the posterior lung base on the lateral film  as cannot exclude infection/atelectasis.    . CHF (congestive heart failure) ([place])   . Diabetes mellitus 2007   Type II, insulin dependent  . Diabetic retinopathy   . GERD (gastroesophageal reflux disease)   . Glaucoma   . Hyperlipidemia   . Hypertension   . Obesity   . Peripheral neuropathy     Surgical Hx: The patient  has a past surgical history that includes Cataract extraction; Glaucoma surgery; Cardiac catheterization; Tubal ligation; left heart catheterization with coronary angiogram (N/A, 01/24/2012); Posterior cervical fusion/foraminotomy (N/A, 10/22/2015); Appendectomy; Anterior cervical decomp/discectomy fusion (N/A, 06/16/2016); and LEFT HEART CATH AND CORONARY ANGIOGRAPHY (N/A, 05/22/2018).   Current Medications: Current Meds  Medication Sig  . amitriptyline (ELAVIL) 25 MG tablet Take 1 tablet (25 mg total) by mouth at bedtime.  Marland Kitchen aspirin EC 81 MG EC tablet Take 1 tablet (81 mg total) by mouth daily.  Marland Kitchen atorvastatin (LIPITOR) 80 MG tablet Take 1 tablet (80 mg total) by mouth daily at 6 PM.  . clopidogrel (PLAVIX) 75 MG tablet Take 1 tablet (75 mg total) by mouth daily.  . COMBIGAN 0.2-0.5 % ophthalmic solution Place 1 drop into both eyes 2 (two) times daily.  . Continuous Blood Gluc Receiver (FREESTYLE LIBRE 14 DAY READER) DEVI 1 each by Does not apply route 4 (four) times daily.  . Continuous Blood Gluc Sensor (FREESTYLE LIBRE 14 DAY SENSOR) MISC 1 each by Does not apply route QID.  Marland Kitchen cyclobenzaprine (FLEXERIL) 10 MG tablet TAKE 1 TABLET(10 MG) BY MOUTH TWICE DAILY AS NEEDED FOR MUSCLE SPASMS (Patient taking differently: Take 10 mg by mouth 2 (two) times daily as needed for muscle spasms. )  . diclofenac sodium (VOLTAREN) 1 % GEL Apply 2 g topically 3 (three) times daily as needed.  . diphenhydrAMINE (BENADRYL) 25 MG tablet Take 1 tablet (25 mg total) by mouth every 6 (six) hours as needed for itching or allergies.  Marland Kitchen dorzolamide (TRUSOPT) 2 % ophthalmic solution Place 1 drop into both eyes 2 (two) times daily.  . famotidine (PEPCID) 20 MG tablet Take 1 tablet (20 mg total) by mouth 2 (two) times daily as needed for indigestion (throat tightness).  . fluticasone (FLONASE) 50 MCG/ACT nasal spray Place 1 spray into both nostrils daily.  . furosemide (LASIX) 20 MG tablet Take  20 mg by mouth 2 (two) times daily.  Marland Kitchen gabapentin (NEURONTIN) 300 MG capsule Take 1 capsule (300 mg total) by mouth 3 (three) times daily.  Marland Kitchen glucose blood (ONETOUCH VERIO) test strip USE TO CHECK BLOOD SUGAR 4 TIMES DAILY AS DIRECTED  . Insulin Pen Needle 32G X 4 MM MISC Use to inject insulin up to 4 times daily  . IRON PO Take 1 tablet by mouth 3 (three) times daily.  . isosorbide mononitrate (IMDUR) 60 MG 24 hr tablet Take 30 mg by mouth 2 (two) times daily.  Marland Kitchen lisinopril (PRINIVIL,ZESTRIL) 5 MG tablet Take 1 tablet (5 mg total) by mouth daily.  . metFORMIN (GLUCOPHAGE) 1000 MG tablet TAKE 1 TABLET BY MOUTH TWICE DAILY WITH A MEAL (Patient taking differently: Take 1,000 mg by mouth 2 (two) times daily with a meal. )  . metoprolol tartrate (LOPRESSOR) 50 MG tablet Take 1.5 tablets (75 mg total) by mouth 2 (two) times daily.  . mupirocin ointment (BACTROBAN) 2 % Apply 1 application topically daily.  Marland Kitchen NOVOLOG FLEXPEN 100 UNIT/ML FlexPen INJECT 12 UNITS UNDER THE SKIN THREE  TIMES DAILY WITH MEALS (Patient taking differently: Inject 12 Units into the skin 3 (three) times daily with meals. )  . ONETOUCH DELICA LANCETS FINE MISC Check blood sugar as instructed up to 3 times a day  . oxyCODONE-acetaminophen (PERCOCET/ROXICET) 5-325 MG tablet Take 1 tablet by mouth every 8 (eight) hours as needed for severe pain.  . pantoprazole (PROTONIX) 40 MG tablet Take 1 tablet (40 mg total) by mouth daily.  . polyethylene glycol (MIRALAX / GLYCOLAX) packet Take 17 g by mouth daily.  Marland Kitchen senna (SENOKOT) 8.6 MG TABS tablet Take 2 tablets (17.2 mg total) by mouth daily.  . TRESIBA FLEXTOUCH 200 UNIT/ML SOPN INJECT 30 UNITS INTO THE SKIN DAILY (Patient taking differently: Inject 30 Units into the skin daily. )  . VICTOZA 18 MG/3ML SOPN INJECT 1.2 MG TOTAL INTO THE SKIN DAILY (Patient taking differently: Inject 1.2 mg into the skin daily. )     Allergies:   Patient has no known allergies.   Social History   Tobacco  Use  . Smoking status: Former Smoker    Packs/day: 0.30    Years: 7.00    Pack years: 2.10    Types: Cigarettes    Last attempt to quit: 06/27/2006    Years since quitting: 12.0  . Smokeless tobacco: Never Used  Substance Use Topics  . Alcohol use: No    Alcohol/week: 0.0 standard drinks  . Drug use: No     Family Hx: The patient's family history includes Heart disease in her mother; Hypertension in her sister, sister, and sister. There is no history of Heart attack or Stroke.  ROS:   Please see the history of present illness.    Review of Systems  Cardiovascular: Positive for leg swelling.  Respiratory: Positive for shortness of breath.   Skin: Positive for rash.   All other systems reviewed and are negative.   EKGs/Labs/Other Test Reviewed:    EKG:  EKG is not ordered today.    Recent Labs: 05/21/2018: B Natriuretic Peptide 30.9 06/19/2018: BUN 20; Creatinine, Ser 1.72; Hemoglobin 10.3; Platelets 327; Potassium 3.6; Sodium 137 07/17/2018: ALT 12   Recent Lipid Panel Lab Results  Component Value Date/Time   CHOL 116 07/17/2018 11:05 AM   TRIG 83 07/17/2018 11:05 AM   HDL 51 07/17/2018 11:05 AM   CHOLHDL 2.3 07/17/2018 11:05 AM   CHOLHDL 4.1 05/21/2018 06:00 PM   LDLCALC 48 07/17/2018 11:05 AM    Physical Exam:    VS:  BP 114/60   Pulse 86   Ht 5\' 6"  (1.676 m)   LMP 02/13/2011   SpO2 99%   BMI 37.45 kg/m     Wt Readings from Last 3 Encounters:  06/19/18 232 lb (105.2 kg)  06/05/18 268 lb 12.8 oz (121.9 kg)  05/24/18 260 lb 3.2 oz (118 kg)     Physical Exam  Constitutional: She is oriented to person, place, and time. She appears well-developed and well-nourished. No distress.  HENT:  Head: Normocephalic and atraumatic.  Eyes: No scleral icterus.  Neck: Neck supple. No JVD present. No thyromegaly present.  Cardiovascular: Normal rate, regular rhythm, S1 normal, S2 normal and normal heart sounds.  No murmur heard. Pulmonary/Chest: Effort normal. She  has no rales.  Abdominal: Soft. There is no hepatomegaly.  Musculoskeletal:        General: Edema (trace bilat LE edema) present.  Lymphadenopathy:    She has no cervical adenopathy.  Neurological: She is alert and oriented to person, place,  and time.  Skin: Skin is warm and dry.  Psychiatric: She has a normal mood and affect.    ASSESSMENT & PLAN:    Coronary artery disease involving native coronary artery of native heart with angina pectoris ([place]) Status post non-ST ovation myocardial infarction in November 2019.  Cardiac catheterization demonstrated diffuse small vessel and distal vessel disease. The culprit was felt to be the distal OM1 or the first diagonal. Both vessels are small in caliber and are not likely amenable to PCI. Her anatomy is also not amenable to CABG. Aggressive medical therapy has been recommended.  She is doing well without anginal symptoms on her current medical regimen.  She did have urticaria at one point since her last visit.  However, this does not sound like a drug reaction.  Continue aspirin, atorvastatin, clopidogrel, isosorbide, metoprolol lisinopril.  Follow-up with Dr. Burt Knack in 3 to 4 months.   Chronic combined systolic and diastolic heart failure ([place]) EF 45-50 by echo in November 2019.  Volume status appears stable.  She is NYHA 2.  Continue ACE inhibitor, beta-blocker and current dose of furosemide.  Essential hypertension The patient's blood pressure is controlled on her current regimen.  Continue current therapy.   Hyperlipidemia, unspecified hyperlipidemia type Continue atorvastatin.  Follow-up lipids and LFTs will be obtained today   Dispo:  Return in about 3 months (around 10/16/2018) for Routine Follow Up, w/ Dr. Burt Knack.   Medication Adjustments/Labs and Tests Ordered: Current medicines are reviewed at length with the patient today.  Concerns regarding medicines are outlined above.  Tests Ordered: No orders of the defined types were  placed in this encounter.  Medication Changes: No orders of the defined types were placed in this encounter.   Signed, Richardson Dopp, PA-C  07/17/2018 5:17 PM    [facility] [address], [place], [place]  [address] Phone: [phone]; Fax: [phone]

## 2018-07-27 IMAGING — DX DG CHEST 2V
2 series · 2 of 2 positions shown · non-contrast
Comparison: 04/30/2015

CLINICAL DATA: Chest pain and dyspnea tonight.

EXAM:
CHEST  2 VIEW

[chest pa]
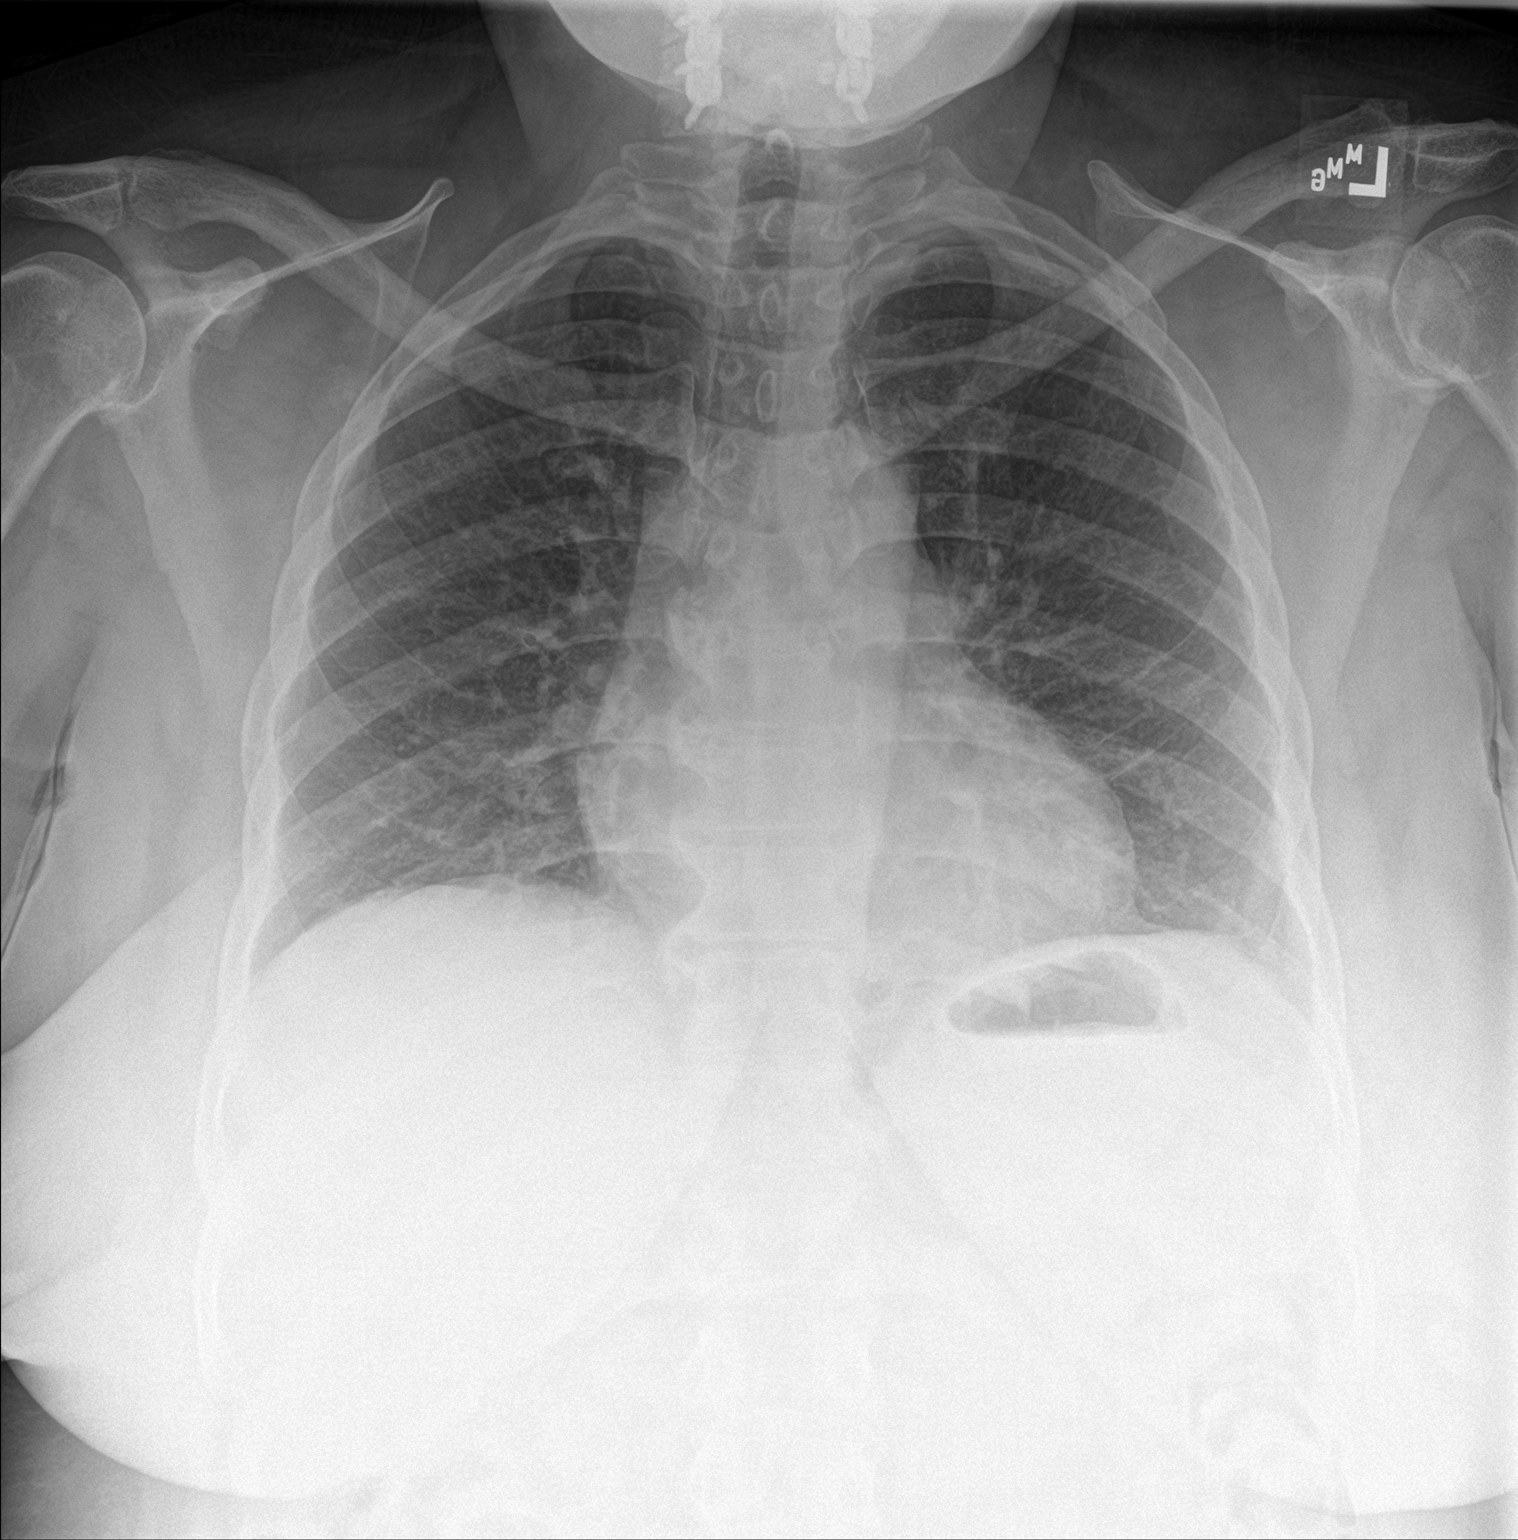

[chest lat]
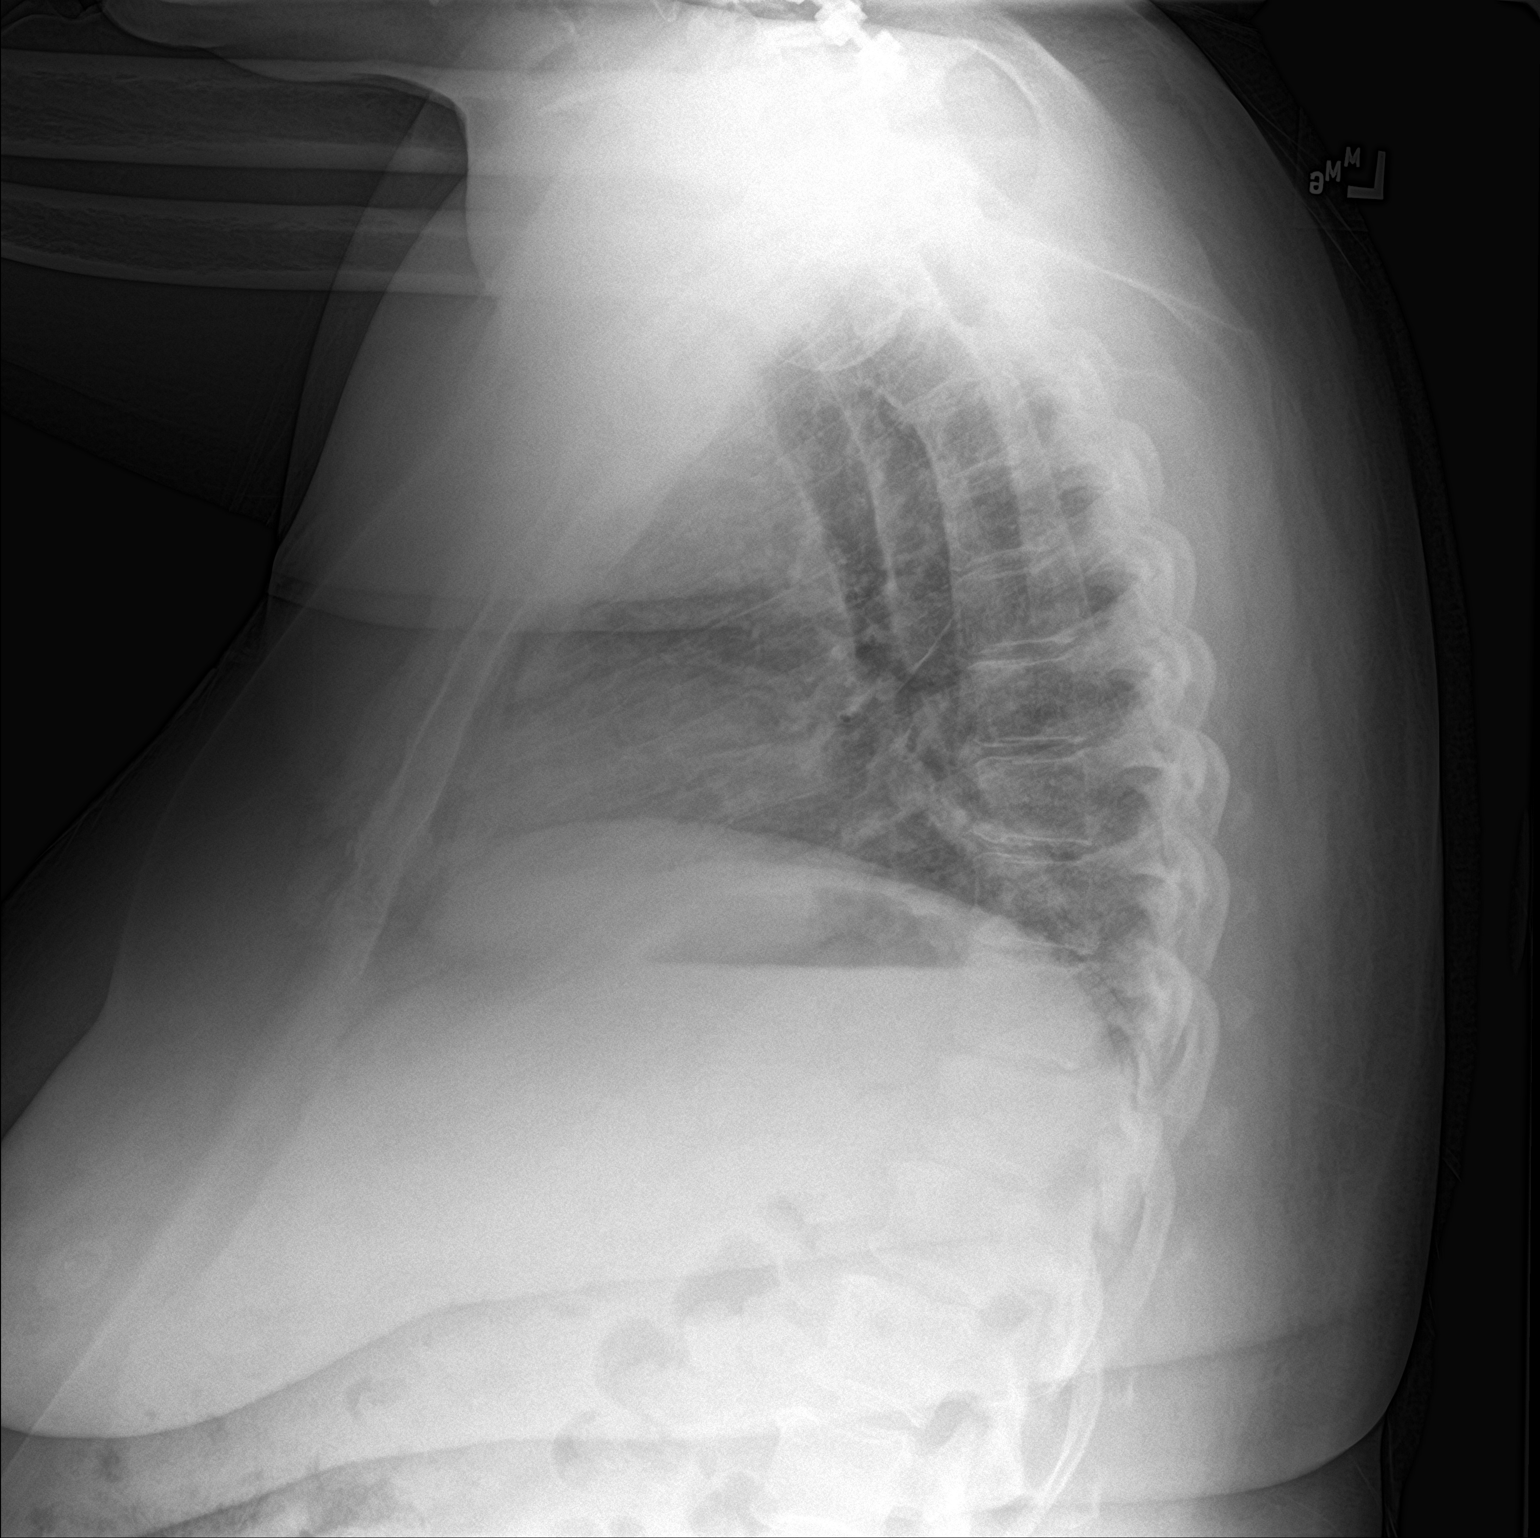

[2 of 2 positions shown; findings below may reference images not displayed]

FINDINGS: The lungs are clear. The pulmonary vasculature is normal. Heart size
is normal. Hilar and mediastinal contours are unremarkable. There is
no pleural effusion.
IMPRESSION: No active cardiopulmonary disease.

## 2018-07-31 ENCOUNTER — Other Ambulatory Visit: Payer: Self-pay | Admitting: Internal Medicine

## 2018-07-31 DIAGNOSIS — Z1231 Encounter for screening mammogram for malignant neoplasm of breast: Secondary | ICD-10-CM

## 2018-08-02 ENCOUNTER — Other Ambulatory Visit: Payer: Self-pay | Admitting: Internal Medicine

## 2018-08-02 DIAGNOSIS — R911 Solitary pulmonary nodule: Secondary | ICD-10-CM

## 2018-08-07 ENCOUNTER — Other Ambulatory Visit: Payer: Medicare HMO

## 2018-08-19 ENCOUNTER — Other Ambulatory Visit: Payer: Self-pay | Admitting: Internal Medicine

## 2018-08-19 DIAGNOSIS — K219 Gastro-esophageal reflux disease without esophagitis: Secondary | ICD-10-CM

## 2018-08-21 ENCOUNTER — Other Ambulatory Visit: Payer: Self-pay | Admitting: Physician Assistant

## 2018-08-21 MED ORDER — ATORVASTATIN CALCIUM 80 MG PO TABS: 80.0000 mg | ORAL_TABLET | Freq: Every day | ORAL | 11 refills | Status: DC

## 2018-08-21 MED ORDER — CLOPIDOGREL BISULFATE 75 MG PO TABS: 75.0000 mg | ORAL_TABLET | Freq: Every day | ORAL | 11 refills | Status: AC

## 2018-08-29 ENCOUNTER — Ambulatory Visit: Payer: Medicare HMO

## 2018-10-16 ENCOUNTER — Telehealth: Payer: Self-pay

## 2018-10-16 NOTE — Telephone Encounter (Signed)
Due to COVID-19 restrictions, [name] is willing to change her 5/4 visit with Dr. Burt Knack to an eVisit on 4/23. Consent received (see below and MyChart messages) for video visit.      Virtual Visit Pre-Appointment Phone Call 1. Confirm consent - "In the setting of the current Covid19 crisis, you are scheduled for a (phone or video) visit with your provider on (date) at (time).  Just as we do with many in-office visits, in order for you to participate in this visit, we must obtain consent.  If you'd like, I can send this to your mychart (if signed up) or email for you to review.  Otherwise, I can obtain your verbal consent now.  All virtual visits are billed to your insurance company just like a normal visit would be.  By agreeing to a virtual visit, we'd like you to understand that the technology does not allow for your provider to perform an examination, and thus may limit your provider's ability to fully assess your condition. If your provider identifies any concerns that need to be evaluated in person, we will make arrangements to do so.  Finally, though the technology is pretty good, we cannot assure that it will always work on either your or our end, and in the setting of a video visit, we may have to convert it to a phone-only visit.  In either situation, we cannot ensure that we have a secure connection.  Are you willing to proceed?" STAFF: Did the patient verbally acknowledge consent to telehealth visit? Document YES/NO here:YES  2. Advise patient to be prepared - "Two hours prior to your appointment, go ahead and check your blood pressure, pulse, oxygen saturation, and your weight (if you have the equipment to check those) and write them all down. When your visit starts, your provider will ask you for this information. If you have an Apple Watch or Kardia device, please plan to have heart rate information ready on the day of your appointment. Please have a pen and paper handy nearby the day of  the visit as well."  3. Give patient instructions for MyChart download to smartphone OR [email or web address] as below if video visit (depending on what platform provider is using)    TELEPHONE CALL NOTE  [name] has been deemed a candidate for a follow-up tele-health visit to limit community exposure during the Covid-19 pandemic. I spoke with the patient via phone to ensure availability of phone/video source, confirm preferred email & phone number, and discuss instructions and expectations.  I reminded [name] to be prepared with any vital sign and/or heart rhythm information that could potentially be obtained via home monitoring, at the time of her visit. I reminded [name] to expect a phone call prior to her visit.  Theodoro Parma, RN 10/16/2018 11:44 AM   IF USING DOXIMITY or [email or web address].ME - The patient will receive a link just prior to their visit by text.     FULL LENGTH CONSENT FOR TELE-HEALTH VISIT   I hereby voluntarily request, consent and authorize [facility] and its employed or contracted physicians, physician assistants, nurse practitioners or other licensed health care professionals (the Practitioner), to provide me with telemedicine health care services (the "Services") as deemed necessary by the treating Practitioner. I acknowledge and consent to receive the Services by the Practitioner via telemedicine. I understand that the telemedicine visit will involve communicating with the Practitioner through live audiovisual communication technology and the disclosure of certain medical information  by electronic transmission. I acknowledge that I have been given the opportunity to request an in-person assessment or other available alternative prior to the telemedicine visit and am voluntarily participating in the telemedicine visit.  I understand that I have the right to withhold or withdraw my consent to the use of telemedicine in the course of my care at any  time, without affecting my right to future care or treatment, and that the Practitioner or I may terminate the telemedicine visit at any time. I understand that I have the right to inspect all information obtained and/or recorded in the course of the telemedicine visit and may receive copies of available information for a reasonable fee.  I understand that some of the potential risks of receiving the Services via telemedicine include:  Marland Kitchen Delay or interruption in medical evaluation due to technological equipment failure or disruption; . Information transmitted may not be sufficient (e.g. poor resolution of images) to allow for appropriate medical decision making by the Practitioner; and/or  . In rare instances, security protocols could fail, causing a breach of personal health information.  Furthermore, I acknowledge that it is my responsibility to provide information about my medical history, conditions and care that is complete and accurate to the best of my ability. I acknowledge that Practitioner's advice, recommendations, and/or decision may be based on factors not within their control, such as incomplete or inaccurate data provided by me or distortions of diagnostic images or specimens that may result from electronic transmissions. I understand that the practice of medicine is not an exact science and that Practitioner makes no warranties or guarantees regarding treatment outcomes. I acknowledge that I will receive a copy of this consent concurrently upon execution via email to the email address I last provided but may also request a printed copy by calling the office of [facility].    I understand that my insurance will be billed for this visit.   I have read or had this consent read to me. . I understand the contents of this consent, which adequately explains the benefits and risks of the Services being provided via telemedicine.  . I have been provided ample opportunity to ask questions  regarding this consent and the Services and have had my questions answered to my satisfaction. . I give my informed consent for the services to be provided through the use of telemedicine in my medical care  By participating in this telemedicine visit I agree to the above.

## 2018-10-18 ENCOUNTER — Other Ambulatory Visit: Payer: Self-pay

## 2018-10-18 ENCOUNTER — Encounter: Payer: Self-pay | Admitting: Cardiovascular Disease

## 2018-10-18 ENCOUNTER — Telehealth (INDEPENDENT_AMBULATORY_CARE_PROVIDER_SITE_OTHER): Payer: Medicare HMO | Admitting: Cardiovascular Disease

## 2018-10-18 VITALS — BP 134/68 | HR 78 | Ht 66.0 in | Wt 264.4 lb

## 2018-10-18 DIAGNOSIS — Z794 Long term (current) use of insulin: Secondary | ICD-10-CM

## 2018-10-18 DIAGNOSIS — I2510 Atherosclerotic heart disease of native coronary artery without angina pectoris: Secondary | ICD-10-CM

## 2018-10-18 DIAGNOSIS — I5042 Chronic combined systolic (congestive) and diastolic (congestive) heart failure: Secondary | ICD-10-CM

## 2018-10-18 DIAGNOSIS — E118 Type 2 diabetes mellitus with unspecified complications: Secondary | ICD-10-CM

## 2018-10-18 DIAGNOSIS — I10 Essential (primary) hypertension: Secondary | ICD-10-CM

## 2018-10-18 NOTE — Progress Notes (Signed)
Virtual Visit via Video Note   This visit type was conducted due to national recommendations for restrictions regarding the COVID-19 Pandemic (e.g. social distancing) in an effort to limit this patient's exposure and mitigate transmission in our community.  Due to her co-morbid illnesses, this patient is at least at moderate risk for complications without adequate follow up.  This format is felt to be most appropriate for this patient at this time.  All issues noted in this document were discussed and addressed.  A limited physical exam was performed with this format.  Please refer to the patient's chart for her consent to telehealth for [facility].   Evaluation Performed:  Follow-up visit  Date:  10/18/2018   ID:  [name] [birth year], MRN [id number]  Patient Location: Home Provider Location: Home  PCP:  Tsosie Billing, MD  Cardiologist:  Sherren Mocha, MD  Electrophysiologist:  None   Chief Complaint:  CHF follow-up  History of Present Illness:    [name] is a 58 y.o. female with chronic systolic heart failure presenting for follow-up evaluation.  The patient does have coronary artery disease but her cardiomyopathy is felt to be out of proportion to extensive CAD.  Her LVEF has been variable, but has been as low as 35 to 40%.  Comorbid conditions include diabetes, hypertension, and morbid obesity.  The patient was hospitalized in November 2019 with non-STEMI and found to have diffuse stenosis in a diagonal branch of the LAD, diffuse stenosis of the mid and distal LAD, distal circumflex, and obtuse marginal.  Because of diffuse distal vessel disease, medical therapy was recommended.  The patient is evaluated via video conferencing technology today because of the COVID-19 pandemic.  Her medications have been adjusted and she was last seen by Richardson Dopp on July 17, 2018.  She is doing well at present, feeling much better with respect to her breathing  since medication adjustments were made.  She is not very active, but denies any symptoms with her routine activities.  She denies orthopnea, PND, chest pain, or chest pressure.  Mild chronic leg swelling is unchanged based on her report.  She has had no lightheadedness or syncope.  She is compliant with her medications.  The patient does not have symptoms concerning for COVID-19 infection (fever, chills, cough, or new shortness of breath).    Past Medical History:  Diagnosis Date   Adhesive capsulitis of shoulder    bilateral, Steroid injection Dr. Truman Hayward 1/12 bilaterally   CAD (coronary artery disease)    nonobstructive. Last cardiac cath (2008) showing left circumflex with mid 50% stenosis and distal luminal irregularities. Also with RCA with mid to distal 30-40% stenosis. // Previously evaluated by [facility], never followed up outpatient.   CAP (community acquired pneumonia) 06/15/2012   05/2012 CXR: Mild opacification of the posterior lung base on the lateral film  as cannot exclude infection/atelectasis.     CHF (congestive heart failure) ([place])    Diabetes mellitus 2007   Type II, insulin dependent   Diabetic retinopathy    GERD (gastroesophageal reflux disease)    Glaucoma    Hyperlipidemia    Hypertension    Obesity    Peripheral neuropathy    Past Surgical History:  Procedure Laterality Date   ANTERIOR CERVICAL DECOMP/DISCECTOMY FUSION N/A 06/16/2016   Procedure: Anterior Cervical Decompression/discectomy Fusion - Cervical six - Cervical seven;  Surgeon: Eustace Moore, MD;  Location: [place];  Service: Neurosurgery;  Laterality: N/A;  Anterior Cervical Decompression/discectomy Fusion - Cervical six - Cervical seven   APPENDECTOMY     CARDIAC CATHETERIZATION     CATARACT EXTRACTION     GLAUCOMA SURGERY     LEFT HEART CATH AND CORONARY ANGIOGRAPHY N/A 05/22/2018   Procedure: LEFT HEART CATH AND CORONARY ANGIOGRAPHY;  Surgeon: Troy Sine, MD;   Location: [place] CV LAB;  Service: Cardiovascular;  Laterality: N/A;   LEFT HEART CATHETERIZATION WITH CORONARY ANGIOGRAM N/A 01/24/2012   Procedure: LEFT HEART CATHETERIZATION WITH CORONARY ANGIOGRAM;  Surgeon: Sherren Mocha, MD;  Location: [name] CATH LAB;  Service: Cardiovascular;  Laterality: N/A;   POSTERIOR CERVICAL FUSION/FORAMINOTOMY N/A 10/22/2015   Procedure: Cervical three-Cervical Seven Posterior cervical fusion with lateral mass fixation,  Laminectomy Cervical Three-Cervical Seven;  Surgeon: Eustace Moore, MD;  Location: [place] NEURO ORS;  Service: Neurosurgery;  Laterality: N/A;  Posterior   TUBAL LIGATION       Current Meds  Medication Sig   amitriptyline (ELAVIL) 25 MG tablet Take 1 tablet (25 mg total) by mouth at bedtime.   aspirin EC 81 MG EC tablet Take 1 tablet (81 mg total) by mouth daily.   atorvastatin (LIPITOR) 80 MG tablet Take 1 tablet (80 mg total) by mouth daily at 6 PM.   clopidogrel (PLAVIX) 75 MG tablet Take 1 tablet (75 mg total) by mouth daily.   COMBIGAN 0.2-0.5 % ophthalmic solution Place 1 drop into both eyes 2 (two) times daily.   Continuous Blood Gluc Receiver (FREESTYLE LIBRE 14 DAY READER) DEVI 1 each by Does not apply route 4 (four) times daily.   Continuous Blood Gluc Sensor (FREESTYLE LIBRE 14 DAY SENSOR) MISC 1 each by Does not apply route QID.   cyclobenzaprine (FLEXERIL) 10 MG tablet TAKE 1 TABLET(10 MG) BY MOUTH TWICE DAILY AS NEEDED FOR MUSCLE SPASMS   diclofenac sodium (VOLTAREN) 1 % GEL Apply 2 g topically 3 (three) times daily as needed.   diphenhydrAMINE (BENADRYL) 25 MG tablet Take 1 tablet (25 mg total) by mouth every 6 (six) hours as needed for itching or allergies.   dorzolamide (TRUSOPT) 2 % ophthalmic solution Place 1 drop into both eyes 2 (two) times daily.   famotidine (PEPCID) 20 MG tablet Take 1 tablet (20 mg total) by mouth 2 (two) times daily as needed for indigestion (throat tightness).   fluticasone (FLONASE) 50  MCG/ACT nasal spray Place 1 spray into both nostrils daily.   furosemide (LASIX) 20 MG tablet Take 20 mg by mouth 2 (two) times daily.   gabapentin (NEURONTIN) 300 MG capsule Take 1 capsule (300 mg total) by mouth 3 (three) times daily.   glucose blood (ONETOUCH VERIO) test strip USE TO CHECK BLOOD SUGAR 4 TIMES DAILY AS DIRECTED   Insulin Pen Needle 32G X 4 MM MISC Use to inject insulin up to 4 times daily   IRON PO Take 1 tablet by mouth 3 (three) times daily.   isosorbide mononitrate (IMDUR) 60 MG 24 hr tablet Take 30 mg by mouth 2 (two) times daily.   lisinopril (PRINIVIL,ZESTRIL) 5 MG tablet Take 1 tablet (5 mg total) by mouth daily.   metFORMIN (GLUCOPHAGE) 1000 MG tablet TAKE 1 TABLET BY MOUTH TWICE DAILY WITH A MEAL   metoprolol tartrate (LOPRESSOR) 50 MG tablet Take 1.5 tablets (75 mg total) by mouth 2 (two) times daily.   NOVOLOG FLEXPEN 100 UNIT/ML FlexPen INJECT 12 UNITS UNDER THE SKIN THREE TIMES DAILY WITH MEALS   ONETOUCH DELICA LANCETS FINE MISC Check blood  sugar as instructed up to 3 times a day   oxyCODONE-acetaminophen (PERCOCET/ROXICET) 5-325 MG tablet Take 1 tablet by mouth every 8 (eight) hours as needed for severe pain.   pantoprazole (PROTONIX) 40 MG tablet Take 1 tablet (40 mg total) by mouth daily.   TRESIBA FLEXTOUCH 200 UNIT/ML SOPN INJECT 30 UNITS INTO THE SKIN DAILY   VICTOZA 18 MG/3ML SOPN INJECT 1.2 MG TOTAL INTO THE SKIN DAILY     Allergies:   Patient has no known allergies.   Social History   Tobacco Use   Smoking status: Former Smoker    Packs/day: 0.30    Years: 7.00    Pack years: 2.10    Types: Cigarettes    Last attempt to quit: 06/27/2006    Years since quitting: 12.3   Smokeless tobacco: Never Used  Substance Use Topics   Alcohol use: No    Alcohol/week: 0.0 standard drinks   Drug use: No     Family Hx: The patient's family history includes Heart disease in her mother; Hypertension in her sister, sister, and sister. There  is no history of Heart attack or Stroke.  ROS:   Please see the history of present illness.    All other systems reviewed and are negative.   Prior CV studies:   The following studies were reviewed today:  Cardiac cath: Conclusion     Ost 1st Diag to 1st Diag lesion is 95% stenosed.  Prox LAD to Mid LAD lesion is 50% stenosed.  Mid LAD lesion is 80% stenosed.  Dist LAD lesion is 70% stenosed.  2nd Mrg lesion is 95% stenosed.  Mid Cx lesion is 90% stenosed.  Dist Cx lesion is 80% stenosed.  Mid RCA lesion is 30% stenosed.  Dist RCA lesion is 30% stenosed.  LV end diastolic pressure is mildly elevated.  The left ventricular ejection fraction is 25-35% by visual estimate.   Severe multivessel CAD with mild calcification of the proximal LAD, long diffuse 90% stenosis in the first diagonal vessel of the LAD, 50% proximal -mid, 80% diffuse mid LAD stenosis and 70% distal LAD stenoses; circumflex vessel with a large second marginal branch with focal 95% stenosis in the distal portion of this marginal vessel and 90 and 80% mid distal AV groove circumflex stenoses; and mild segmental 30% narrowing before and after the acute margin in the RCA.  New severe LV dysfunction with an EF of 25 to 30% with diffuse hypocontractility involving the mid distal anterolateral wall apex, and apical distal inferior wall and LVEDP 19 mm  RECOMMENDATION: Angiograms were reviewed with colleagues.  In comparison to the prior study of 2013, there is now significant progression of CAD with diffuse stenoses in the diagonal vessel mid distal LAD, distal circumflex marginal, mid distal AV groove circumflex vessels.  Due to the distal disease CABG revascularization is not favorable.  The patient will be gently hydrated post procedure.  She will be started on aggressive medical management initially and if medical therapy fails consider staged PCI.  The patient will be started on carvedilol, nitrates,  consider amlodipine and possible ranolazine.  If she is not felt to be an operative candidate dual antiplatelet therapy will be beneficial.   Echocardiogram 05/23/2018: - Left ventricle: The cavity size was normal. There was mild   concentric hypertrophy. Systolic function was mildly reduced. The   estimated ejection fraction was in the range of 45% to 50%.   Diffuse hypokinesis. Although no diagnostic regional wall motion  abnormality was identified, this possibility cannot be completely   excluded on the basis of this study. The study is not technically   sufficient to allow evaluation of LV diastolic function. - Aortic valve: There was no significant regurgitation. - Mitral valve: There was no significant regurgitation. - Right ventricle: Systolic function was normal. - Atrial septum: No defect or patent foramen ovale was identified.  Impressions:  - Technically difficult study, even with administration of echo   contrast (definity). LV EF appears visually mildly reduced, with   global mild hypokinesis. Reduced sensitivity for detection of   focal wall motion abnormalities. No significant valvular disease   appreciated.  Labs/Other Tests and Data Reviewed:    EKG:  An ECG dated 06/19/2018 was personally reviewed today and demonstrated:  Normal sinus rhythm 88 bpm, nonspecific ST and T wave abnormality  Recent Labs: 05/21/2018: B Natriuretic Peptide 30.9 06/19/2018: BUN 20; Creatinine, Ser 1.72; Hemoglobin 10.3; Platelets 327; Potassium 3.6; Sodium 137 07/17/2018: ALT 12   Recent Lipid Panel Lab Results  Component Value Date/Time   CHOL 116 07/17/2018 11:05 AM   TRIG 83 07/17/2018 11:05 AM   HDL 51 07/17/2018 11:05 AM   CHOLHDL 2.3 07/17/2018 11:05 AM   CHOLHDL 4.1 05/21/2018 06:00 PM   LDLCALC 48 07/17/2018 11:05 AM    Wt Readings from Last 3 Encounters:  10/18/18 264 lb 6.4 oz (119.9 kg)  07/17/18 263 lb 12.8 oz (119.7 kg)  06/19/18 232 lb (105.2 kg)      Objective:    Vital Signs:  BP 134/68 (BP Location: Right Arm, Patient Position: Sitting, Cuff Size: Normal)    Pulse 78    Ht 5\' 6"  (9.509 m)    Wt 264 lb 6.4 oz (119.9 kg)    LMP 02/13/2011    BMI 42.68 kg/m    VITAL SIGNS:  reviewed The patient is alert, oriented, in no distress.  She is breathing comfortably with normal conversation.  Remaining exam not performed as this is a telemedicine visit with inability to fully examine the patient.  ASSESSMENT & PLAN:    1. Chronic systolic heart failure, New York Heart Association functional class IIb symptoms.  I reviewed the patient's most recent cardiac catheterization and echocardiogram studies.  There is some variability in LVEF, with severe LV dysfunction described by left ventriculography, but only mild LV dysfunction by echo assessment with LVEF estimated at 45 to 50%.  Poor acoustic windows were noted even with echo contrast agents administered.  The patient is on an appropriate medical program as reviewed above.  She will continue on her current program without changes.  Her symptoms are well controlled at present.  We discussed ongoing efforts at lifestyle modification for weight loss and exercise. 2. Coronary artery disease, native vessel, with angina: Her anginal symptoms are also well controlled on her current medical program.  She is not having any functional limitations related to coronary artery disease at present. 3. Morbid obesity: Lifestyle modification discussed. 4. Hypertension: Blood pressure well controlled on current medical program 5. Mixed hyperlipidemia: LDL goal less than 70 mg/dL with diabetes and coronary artery disease/non-STEMI: Patient treated with a high intensity statin drug.  Most recent lipids reviewed as above.  COVID-19 Education: The signs and symptoms of COVID-19 were discussed with the patient and how to seek care for testing (follow up with PCP or arrange E-visit).  The importance of social distancing was  discussed today.  Time:   Today, I have spent 25 minutes with  the patient with telehealth technology discussing the above problems.     Medication Adjustments/Labs and Tests Ordered: Current medicines are reviewed at length with the patient today.  Concerns regarding medicines are outlined above.   Tests Ordered: No orders of the defined types were placed in this encounter.   Medication Changes: No orders of the defined types were placed in this encounter.   Disposition:  Follow up in 6 month(s)APP  Signed, Sherren Mocha, MD  10/18/2018 11:05 AM    [place]

## 2018-10-20 ENCOUNTER — Encounter: Payer: Self-pay | Admitting: Cardiovascular Disease

## 2018-10-22 NOTE — Patient Instructions (Signed)
Medication Instructions:  Your provider recommends that you continue on your current medications as directed. Please refer to the Current Medication list given to you today.     Follow-Up: Your provider wants you to follow-up in: 6 months with Dr.Cooper's assistant, Nicki Reaper. You will receive a reminder letter in the mail two months in advance. If you don't receive a letter, please call our office to schedule the follow-up appointment.

## 2018-10-29 ENCOUNTER — Ambulatory Visit: Payer: Medicare HMO | Admitting: Cardiovascular Disease

## 2018-11-05 ENCOUNTER — Other Ambulatory Visit: Payer: Self-pay | Admitting: Internal Medicine

## 2018-11-05 DIAGNOSIS — E1140 Type 2 diabetes mellitus with diabetic neuropathy, unspecified: Secondary | ICD-10-CM

## 2018-11-05 DIAGNOSIS — E1165 Type 2 diabetes mellitus with hyperglycemia: Secondary | ICD-10-CM

## 2018-11-05 DIAGNOSIS — IMO0002 Reserved for concepts with insufficient information to code with codable children: Secondary | ICD-10-CM

## 2018-12-10 IMAGING — DX DG CHEST 2V
2 series · 2 of 2 positions shown · non-contrast
Comparison: None.

CLINICAL DATA: Chest pain, epigastric pain for 3 days, vomiting

EXAM:
CHEST  2 VIEW

[chest pa]
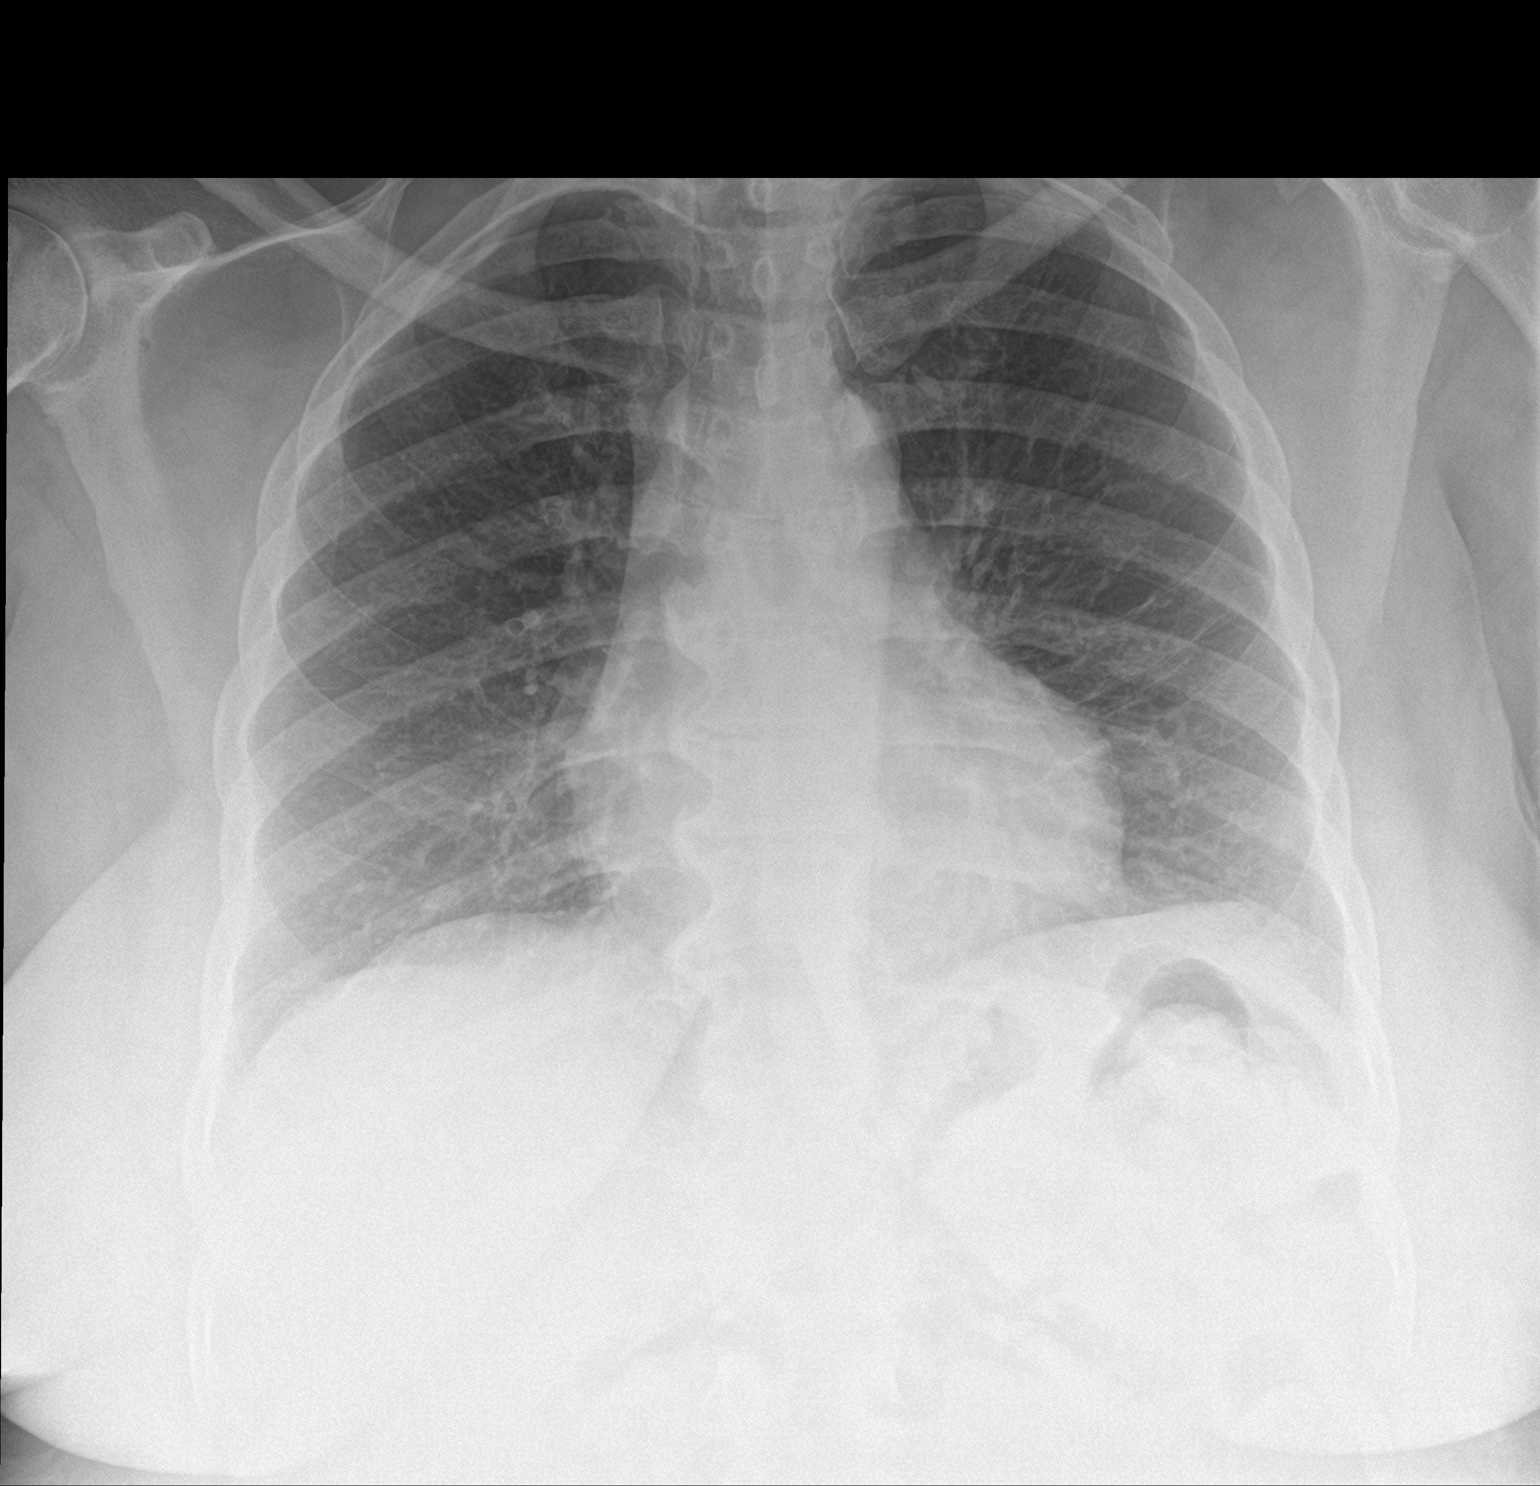

[chest lat]
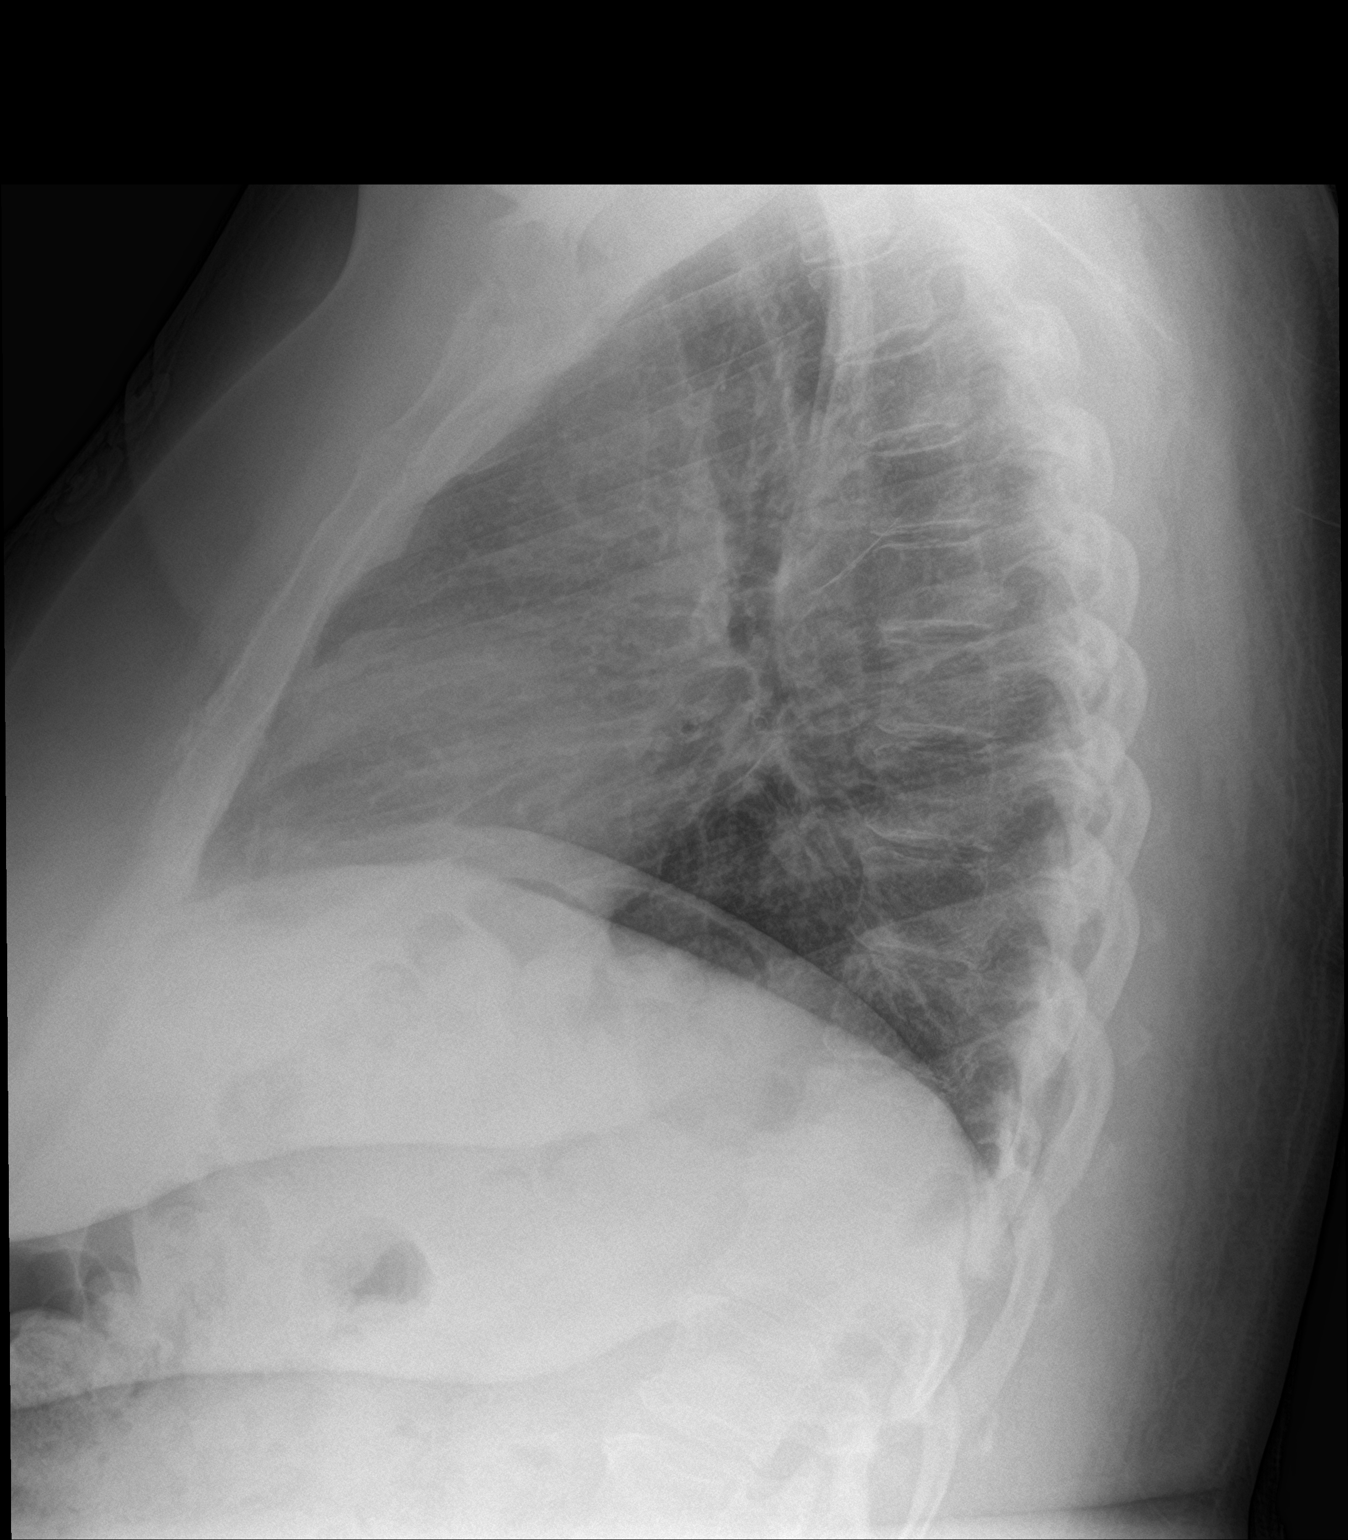

[2 of 2 positions shown; findings below may reference images not displayed]

FINDINGS: 05/10/2016
IMPRESSION: Cardiomediastinal silhouette is stable. No infiltrate or pleural
effusion. No pulmonary edema. Degenerative changes thoracic spine
again noted.

## 2019-03-26 NOTE — Progress Notes (Signed)
Cardiology Office Note:    Date:  03/27/2019   ID:  ERICH FRUGE, DOB 28-Sep-1960, MRN [id number]  PCP:  Tsosie Billing, MD (Inactive)  Cardiologist:  Sherren Mocha, MD   Electrophysiologist:  None   Referring MD: No ref. provider found   Chief Complaint  Patient presents with  . Follow-up    CAD, CHF     History of Present Illness:    [name] is a 58 y.o. female with:   Coronary artery disease   NSTEMI 04/2018 >> Cath w/ diffuse disease >> no targets for CABG >> Med Rx  Cath: pLAD 50, mLAD 80, dLAD 70; oD1 95, mLCx 90, dLCx 80, OM2 95, mRCA 30, dRCA 30  PCI could be considered if medical Rx failed  Chronic Systolic CHF  Echocardiogram 7/13: EF 35-40  Echocardiogram 11/16: EF 55-60  Echocardiogram 8/18: EF 50-55  Echocardiogram 11/19: EF 45-50  Non-ischemic cardiomyopathy (CM out of proportion to extensive CAD)   Morbid obesity   Diabetes mellitus   Hypertension   Hyperlipidemia   Lung nodule  [name] was last seen by Dr. Burt Knack in 09/2018 via Telemedicine.    She returns for follow up.  She is here alone.  Since last seen, she has done well without chest discomfort.  She does note shortness of breath with certain activities.  Overall, her breathing is stable without significant change.  She sleeps on several pillows chronically.  She occasionally has increased lower extremity swelling.  She has not had syncope.  She does get lightheaded at times when she stands.    Prior CV studies:   The following studies were reviewed today:   Echo 05/23/18 Mild conc LVH, EF 45-50, diff HK, normal RVSF  Cardiac Catheterization11/26/19 LAD prox 50, mid 80, dist 70; D1 ost 95 LCx mid 90, dist 80; OM2 95 RCA mid 30, dist 30 EF 25-35, diff HK involving mid distal anterolateral wall and apex extending to mid distal portion of inf wall RECOMMENDATION: Angiograms were reviewed with colleagues. In comparison to the prior study of 2013, there  is now significant progression of CAD with diffuse stenoses in the diagonal vessel mid distal LAD, distal circumflex marginal, mid distal AV groove circumflex vessels. Due to the distal disease CABG revascularization is not favorable. The patient will be gently hydrated post procedure. She will be started on aggressive medical management initially and if medical therapy fails consider staged PCI. The patient will be started on carvedilol, nitrates, consider amlodipine and possible ranolazine. If she is not felt to be an operative candidate dual antiplatelet therapy will be beneficial.   Echo 02/07/17 EF 50-55, no RWMA, Gr 1 DD, normal RVSF  Echo 05/01/15 Mild LVH, EF 55-60, no RWMA, Gr 1 DD  Echocardiogram 7/13:  limited study, EF 35-40%.   LHC 7/13:  Mid LAD 50% within a myocardial bridge, EF 40-45%.  Past Medical History:  Diagnosis Date  . Adhesive capsulitis of shoulder    bilateral, Steroid injection Dr. Truman Hayward 1/12 bilaterally  . CAD (coronary artery disease)    nonobstructive. Last cardiac cath (2008) showing left circumflex with mid 50% stenosis and distal luminal irregularities. Also with RCA with mid to distal 30-40% stenosis. // Previously evaluated by [name], never followed up outpatient.  Marland Kitchen CAP (community acquired pneumonia) 06/15/2012   05/2012 CXR: Mild opacification of the posterior lung base on the lateral film  as cannot exclude infection/atelectasis.    . CHF (congestive heart failure) ([place])   .  Diabetes mellitus 2007   Type II, insulin dependent  . Diabetic retinopathy   . GERD (gastroesophageal reflux disease)   . Glaucoma   . Hyperlipidemia   . Hypertension   . Obesity   . Peripheral neuropathy    Surgical Hx: The patient  has a past surgical history that includes Cataract extraction; Glaucoma surgery; Cardiac catheterization; Tubal ligation; left heart catheterization with coronary angiogram (N/A, 01/24/2012); Posterior cervical  fusion/foraminotomy (N/A, 10/22/2015); Appendectomy; Anterior cervical decomp/discectomy fusion (N/A, 06/16/2016); and LEFT HEART CATH AND CORONARY ANGIOGRAPHY (N/A, 05/22/2018).   Current Medications: Current Meds  Medication Sig  . amitriptyline (ELAVIL) 25 MG tablet Take 1 tablet (25 mg total) by mouth at bedtime.  Marland Kitchen aspirin EC 81 MG EC tablet Take 1 tablet (81 mg total) by mouth daily.  Marland Kitchen atorvastatin (LIPITOR) 80 MG tablet Take 1 tablet (80 mg total) by mouth daily at 6 PM.  . clopidogrel (PLAVIX) 75 MG tablet Take 1 tablet (75 mg total) by mouth daily.  . COMBIGAN 0.2-0.5 % ophthalmic solution Place 1 drop into both eyes 2 (two) times daily.  . Continuous Blood Gluc Receiver (FREESTYLE LIBRE 14 DAY READER) DEVI 1 each by Does not apply route 4 (four) times daily.  . Continuous Blood Gluc Sensor (FREESTYLE LIBRE 14 DAY SENSOR) MISC 1 each by Does not apply route QID.  Marland Kitchen cyclobenzaprine (FLEXERIL) 10 MG tablet TAKE 1 TABLET(10 MG) BY MOUTH TWICE DAILY AS NEEDED FOR MUSCLE SPASMS  . diclofenac sodium (VOLTAREN) 1 % GEL Apply 2 g topically 3 (three) times daily as needed.  . diphenhydrAMINE (BENADRYL) 25 MG tablet Take 1 tablet (25 mg total) by mouth every 6 (six) hours as needed for itching or allergies.  Marland Kitchen dorzolamide (TRUSOPT) 2 % ophthalmic solution Place 1 drop into both eyes 2 (two) times daily.  . famotidine (PEPCID) 20 MG tablet Take 1 tablet (20 mg total) by mouth 2 (two) times daily as needed for indigestion (throat tightness).  . fluticasone (FLONASE) 50 MCG/ACT nasal spray Place 1 spray into both nostrils daily.  . furosemide (LASIX) 20 MG tablet Take 20 mg by mouth 2 (two) times daily.  Marland Kitchen gabapentin (NEURONTIN) 300 MG capsule Take 1 capsule (300 mg total) by mouth 3 (three) times daily.  Marland Kitchen glucose blood (ONETOUCH VERIO) test strip USE TO CHECK BLOOD SUGAR 4 TIMES DAILY AS DIRECTED  . Insulin Pen Needle 32G X 4 MM MISC Use to inject insulin up to 4 times daily  . IRON PO Take 1  tablet by mouth 3 (three) times daily.  . isosorbide mononitrate (IMDUR) 60 MG 24 hr tablet Take 30 mg by mouth daily.   Marland Kitchen latanoprost (XALATAN) 0.005 % ophthalmic solution Place 1 drop into both eyes 2 (two) times a day.  . lisinopril (ZESTRIL) 2.5 MG tablet Take 1 tablet (2.5 mg total) by mouth daily.  . metFORMIN (GLUCOPHAGE) 1000 MG tablet TAKE 1 TABLET BY MOUTH TWICE DAILY WITH A MEAL  . metoprolol tartrate (LOPRESSOR) 50 MG tablet Take 1.5 tablets (75 mg total) by mouth 2 (two) times daily.  Marland Kitchen NOVOLOG FLEXPEN 100 UNIT/ML FlexPen INJECT 12 UNITS UNDER THE SKIN THREE TIMES DAILY WITH MEALS  . ONETOUCH DELICA LANCETS FINE MISC Check blood sugar as instructed up to 3 times a day  . oxyCODONE-acetaminophen (PERCOCET/ROXICET) 5-325 MG tablet Take 1 tablet by mouth every 8 (eight) hours as needed for severe pain.  . pantoprazole (PROTONIX) 40 MG tablet Take 1 tablet (40 mg total) by mouth  daily.  Marland Kitchen SANTYL ointment Apply 1 application topically daily.  . TRESIBA FLEXTOUCH 200 UNIT/ML SOPN INJECT 30 UNITS INTO THE SKIN DAILY  . triamcinolone cream (KENALOG) 0.1 % Apply 1 application topically as needed.  Marland Kitchen VICTOZA 18 MG/3ML SOPN INJECT 1.2 MG TOTAL INTO THE SKIN DAILY  . [DISCONTINUED] lisinopril (PRINIVIL,ZESTRIL) 5 MG tablet Take 1 tablet (5 mg total) by mouth daily.     Allergies:   Patient has no known allergies.   Social History   Tobacco Use  . Smoking status: Former Smoker    Packs/day: 0.30    Years: 7.00    Pack years: 2.10    Types: Cigarettes    Quit date: 06/27/2006    Years since quitting: 12.7  . Smokeless tobacco: Never Used  Substance Use Topics  . Alcohol use: No    Alcohol/week: 0.0 standard drinks  . Drug use: No     Family Hx: The patient's family history includes Heart disease in her mother; Hypertension in her sister, sister, and sister. There is no history of Heart attack or Stroke.  ROS:   Please see the history of present illness.    Review of Systems   Gastrointestinal: Negative for hematochezia and melena.  Genitourinary: Negative for hematuria.   All other systems reviewed and are negative.   EKGs/Labs/Other Test Reviewed:    EKG:  EKG is not ordered today.  The ekg ordered today demonstrates n/a  Recent Labs: 05/21/2018: B Natriuretic Peptide 30.9 06/19/2018: BUN 20; Creatinine, Ser 1.72; Hemoglobin 10.3; Platelets 327; Potassium 3.6; Sodium 137 07/17/2018: ALT 12   Recent Lipid Panel Lab Results  Component Value Date/Time   CHOL 116 07/17/2018 11:05 AM   TRIG 83 07/17/2018 11:05 AM   HDL 51 07/17/2018 11:05 AM   CHOLHDL 2.3 07/17/2018 11:05 AM   CHOLHDL 4.1 05/21/2018 06:00 PM   LDLCALC 48 07/17/2018 11:05 AM    Physical Exam:    VS:  BP (!) 118/56   Pulse 82   Ht 5\' 6"  (1.676 m)   Wt 266 lb (120.7 kg)   LMP 02/13/2011   SpO2 97%   BMI 42.93 kg/m     Wt Readings from Last 3 Encounters:  03/27/19 266 lb (120.7 kg)  10/18/18 264 lb 6.4 oz (119.9 kg)  07/17/18 263 lb 12.8 oz (119.7 kg)     Physical Exam  Constitutional: She is oriented to person, place, and time. She appears well-developed and well-nourished. No distress.  HENT:  Head: Normocephalic and atraumatic.  Eyes: No scleral icterus.  Neck: No JVD present. No thyromegaly present.  Cardiovascular: Normal rate, regular rhythm and normal heart sounds.  No murmur heard. Pulmonary/Chest: Effort normal and breath sounds normal. She has no rales.  Abdominal: Soft. There is no hepatomegaly.  Musculoskeletal:        General: Edema (tr-1+ bilat LE edema) present.  Lymphadenopathy:    She has no cervical adenopathy.  Neurological: She is alert and oriented to person, place, and time.  Skin: Skin is warm and dry.  Psychiatric: She has a normal mood and affect.    ASSESSMENT & PLAN:    1. Coronary artery disease involving native coronary artery of native heart without angina pectoris S/p NSTEMI in November 2019.  Cardiac catheterization demonstrated  diffuse small vessel and distal vessel disease. The culprit was felt to be the distal OM1 or the first diagonal. Both vessels are small in caliber and are not likely amenable to PCI. Her anatomy is  also not amenable to CABG. Aggressive medical therapy has been recommended.    She is currently doing well without anginal symptoms.  Continue aspirin, atorvastatin, clopidogrel, isosorbide, metoprolol.  Follow-up with Dr. Burt Knack or me in 6 months.  2. Chronic systolic CHF (congestive heart failure) ([place]) EF 45-50 by echocardiogram November 2019.  She is currently NYHA 2-2b.  She does have a little bit of swelling today.  I have advised her to take extra Lasix as needed for increased lower extremity swelling or weight gain > 3 lbs in 24 hours.  She does have some dizziness with standing and her blood pressure is running somewhat low.  I will reduce her lisinopril to 2.5 mg daily.  Continue isosorbide and metoprolol tartrate.  3. Essential hypertension Adjust lisinopril as noted for dizziness and low blood pressure.  She knows to contact us if her blood pressure should start to run above target (130/80).  4. Type 2 diabetes mellitus with complication, with long-term current use of insulin ([place]) Continue follow-up with primary care.  5. Hyperlipidemia, unspecified hyperlipidemia type Continue high-dose statin therapy.  Obtain recent lipid panel from primary care.   Dispo:  Return in about 6 months (around 09/24/2019) for Routine Follow Up, w/ Dr. Burt Knack, or Richardson Dopp, PA-C.   Medication Adjustments/Labs and Tests Ordered: Current medicines are reviewed at length with the patient today.  Concerns regarding medicines are outlined above.  Tests Ordered: No orders of the defined types were placed in this encounter.  Medication Changes: Meds ordered this encounter  Medications  . lisinopril (ZESTRIL) 2.5 MG tablet    Sig: Take 1 tablet (2.5 mg total) by mouth daily.    Dispense:  90 tablet     Refill:  3    Signed, Richardson Dopp, PA-C  03/27/2019 12:44 PM    [facility] [address], [place], [place]  [address] Phone: [phone]; Fax: [phone]

## 2019-03-27 ENCOUNTER — Ambulatory Visit (INDEPENDENT_AMBULATORY_CARE_PROVIDER_SITE_OTHER): Payer: Medicare HMO | Admitting: Physician Assistant

## 2019-03-27 ENCOUNTER — Encounter: Payer: Self-pay | Admitting: Physician Assistant

## 2019-03-27 ENCOUNTER — Other Ambulatory Visit: Payer: Self-pay

## 2019-03-27 VITALS — BP 118/56 | HR 82 | Ht 66.0 in | Wt 266.0 lb

## 2019-03-27 DIAGNOSIS — I5022 Chronic systolic (congestive) heart failure: Secondary | ICD-10-CM | POA: Diagnosis not present

## 2019-03-27 DIAGNOSIS — Z794 Long term (current) use of insulin: Secondary | ICD-10-CM

## 2019-03-27 DIAGNOSIS — I2510 Atherosclerotic heart disease of native coronary artery without angina pectoris: Secondary | ICD-10-CM | POA: Diagnosis not present

## 2019-03-27 DIAGNOSIS — I10 Essential (primary) hypertension: Secondary | ICD-10-CM

## 2019-03-27 DIAGNOSIS — E118 Type 2 diabetes mellitus with unspecified complications: Secondary | ICD-10-CM | POA: Diagnosis not present

## 2019-03-27 DIAGNOSIS — E785 Hyperlipidemia, unspecified: Secondary | ICD-10-CM

## 2019-03-27 MED ORDER — LISINOPRIL 2.5 MG PO TABS: 2.5000 mg | ORAL_TABLET | Freq: Every day | ORAL | 3 refills | Status: DC

## 2019-03-27 NOTE — Patient Instructions (Signed)
Medication Instructions:  DECREASE: Lisinopril to 2.5 mg once a day   If you need a refill on your cardiac medications before your next appointment, please call your pharmacy.   Lab work: None   If you have labs (blood work) drawn today and your tests are completely normal, you will receive your results only by: Marland Kitchen MyChart Message (if you have MyChart) OR . A paper copy in the mail If you have any lab test that is abnormal or we need to change your treatment, we will call you to review the results.  Testing/Procedures: None   Follow-Up: At [name], you and your health needs are our priority.  As part of our continuing mission to provide you with exceptional heart care, we have created designated Provider Care Teams.  These Care Teams include your primary Cardiologist (physician) and Advanced Practice Providers (APPs -  Physician Assistants and Nurse Practitioners) who all work together to provide you with the care you need, when you need it. You will need a follow up appointment in:  6 months.  Please call our office 2 months in advance to schedule this appointment.  You may see Sherren Mocha, MD or one of the following Advanced Practice Providers on your designated Care Team: Richardson Dopp, PA-C [place], [place] . Daune Perch, NP  Any Other Special Instructions Will Be Listed Below (If Applicable).  Call our office if the top number of your blood pressure is consistently running above 130

## 2019-05-30 ENCOUNTER — Emergency Department (HOSPITAL_COMMUNITY): Payer: Medicare HMO

## 2019-05-30 ENCOUNTER — Other Ambulatory Visit: Payer: Self-pay

## 2019-05-30 ENCOUNTER — Encounter (HOSPITAL_COMMUNITY): Payer: Self-pay | Admitting: Emergency Medicine

## 2019-05-30 ENCOUNTER — Emergency Department (HOSPITAL_COMMUNITY)
Admission: EM | Admit: 2019-05-30 | Discharge: 2019-05-30 | Disposition: A | Discharge status: Home / Self Care | Payer: Medicare HMO | Attending: Emergency Medicine | Admitting: Emergency Medicine

## 2019-05-30 DIAGNOSIS — R0789 Other chest pain: Secondary | ICD-10-CM | POA: Insufficient documentation

## 2019-05-30 DIAGNOSIS — I110 Hypertensive heart disease with heart failure: Secondary | ICD-10-CM | POA: Diagnosis not present

## 2019-05-30 DIAGNOSIS — Z7984 Long term (current) use of oral hypoglycemic drugs: Secondary | ICD-10-CM | POA: Insufficient documentation

## 2019-05-30 DIAGNOSIS — R079 Chest pain, unspecified: Secondary | ICD-10-CM

## 2019-05-30 DIAGNOSIS — I252 Old myocardial infarction: Secondary | ICD-10-CM | POA: Insufficient documentation

## 2019-05-30 DIAGNOSIS — Z7982 Long term (current) use of aspirin: Secondary | ICD-10-CM | POA: Insufficient documentation

## 2019-05-30 DIAGNOSIS — I5042 Chronic combined systolic (congestive) and diastolic (congestive) heart failure: Secondary | ICD-10-CM | POA: Diagnosis not present

## 2019-05-30 DIAGNOSIS — E119 Type 2 diabetes mellitus without complications: Secondary | ICD-10-CM | POA: Diagnosis not present

## 2019-05-30 DIAGNOSIS — I2510 Atherosclerotic heart disease of native coronary artery without angina pectoris: Secondary | ICD-10-CM | POA: Diagnosis not present

## 2019-05-30 DIAGNOSIS — Z87891 Personal history of nicotine dependence: Secondary | ICD-10-CM | POA: Insufficient documentation

## 2019-05-30 DIAGNOSIS — Z79899 Other long term (current) drug therapy: Secondary | ICD-10-CM | POA: Insufficient documentation

## 2019-05-30 LAB — BASIC METABOLIC PANEL
Anion gap: 13 (ref 5–15)
BUN: 15 mg/dL (ref 6–20)
CO2: 29 mmol/L (ref 22–32)
Calcium: 8.1 mg/dL — ABNORMAL LOW (ref 8.9–10.3)
Chloride: 96 mmol/L — ABNORMAL LOW (ref 98–111)
Creatinine, Ser: 1.06 mg/dL — ABNORMAL HIGH (ref 0.44–1.00)
GFR calc Af Amer: >60 mL/min (ref >60)
GFR calc non Af Amer: 57 mL/min — ABNORMAL LOW (ref >60)
Glucose, Bld: 235 mg/dL — ABNORMAL HIGH (ref 70–99)
Potassium: 3.6 mmol/L (ref 3.5–5.1)
Sodium: 138 mmol/L (ref 135–145)

## 2019-05-30 LAB — CBC
HCT: 37.2 % (ref 36.0–46.0)
Hemoglobin: 12 g/dL (ref 12.0–15.0)
MCH: 28.2 pg (ref 26.0–34.0)
MCHC: 32.3 g/dL (ref 30.0–36.0)
MCV: 87.5 fL (ref 80.0–100.0)
Platelets: 322 10*3/uL (ref 150–400)
RBC: 4.25 MIL/uL (ref 3.87–5.11)
RDW: 14.2 % (ref 11.5–15.5)
WBC: 13.8 10*3/uL — ABNORMAL HIGH (ref 4.0–10.5)
nRBC: 0 % (ref 0.0–0.2)

## 2019-05-30 LAB — HEPATIC FUNCTION PANEL
ALT: 16 U/L (ref 0–44)
AST: 15 U/L (ref 15–41)
Albumin: 3.5 g/dL (ref 3.5–5.0)
Alkaline Phosphatase: 68 U/L (ref 38–126)
Bilirubin, Direct: <0.1 mg/dL (ref 0.0–0.2)
Total Bilirubin: 0.3 mg/dL (ref 0.3–1.2)
Total Protein: 7.1 g/dL (ref 6.5–8.1)

## 2019-05-30 LAB — TROPONIN I (HIGH SENSITIVITY)
Troponin I (High Sensitivity): 9 ng/L (ref <18)
Troponin I (High Sensitivity): 10 ng/L (ref <18)

## 2019-05-30 LAB — I-STAT BETA HCG BLOOD, ED (MC, WL, AP ONLY): I-stat hCG, quantitative: <5 m[IU]/mL (ref <5)

## 2019-05-30 MED ORDER — ONDANSETRON HCL 4 MG/2ML IJ SOLN
4.0000 mg | Freq: Once | INTRAMUSCULAR | Status: AC
  Administered 2019-05-30: 4 mg via INTRAVENOUS
  Filled 2019-05-30: qty 2

## 2019-05-30 MED ORDER — ASPIRIN 81 MG PO CHEW
324.0000 mg | CHEWABLE_TABLET | Freq: Once | ORAL | Status: AC
  Administered 2019-05-30: 324 mg via ORAL
  Filled 2019-05-30: qty 4

## 2019-05-30 MED ORDER — FENTANYL CITRATE (PF) 100 MCG/2ML IJ SOLN
50.0000 ug | Freq: Once | INTRAMUSCULAR | Status: AC
  Administered 2019-05-30: 50 ug via INTRAVENOUS
  Filled 2019-05-30: qty 2

## 2019-05-30 MED ORDER — SODIUM CHLORIDE 0.9% FLUSH
3.0000 mL | Freq: Once | INTRAVENOUS | Status: AC
  Administered 2019-05-30: 3 mL via INTRAVENOUS

## 2019-05-30 NOTE — ED Triage Notes (Signed)
C/o intermittent L sided chest cramping, SOB, nausea, vomiting, and headache since Thanksgiving.

## 2019-05-30 NOTE — ED Provider Notes (Signed)
[place] EMERGENCY DEPARTMENT Provider Note   CSN: [id number] Arrival date & time: 05/30/19  0756     History   Chief Complaint Chief Complaint  Patient presents with  . Chest Pain    HPI [name] is a 58 y.o. female.     HPI  58 year old female history of coronary artery disease, status post NSTEMI, CHF, type 2 diabetes, hyperlipidemia, hypertension, obesity presents today complaining of 1 week of left-sided chest pain.  She describes the pain as crampy in nature.  It is worse with movement and exertion.  It has no true relieving factors but has improved when she is resting.  She did perhaps describes the pain as mostly present for the last week and now constant for the past 2 days.  She has not taken any medications.  The pain is currently 10 out of 10.  She does not remember if it is specifically like her MI type pain.  She has had some associated dyspnea that just comes and goes.  She denies any history of DVT or PE.  She has some associated nausea.  She reports decreased appetite.  She has some dizziness which she describes as being lightheaded and her head spinning.  This is worse with laying flat.  She denies any cough, Covid exposure, or history of positive Covid test.  She is followed by [facility] cardiology.  She reports taking all medications as prescribed.  Past Medical History:  Diagnosis Date  . Adhesive capsulitis of shoulder    bilateral, Steroid injection Dr. Truman Hayward 1/12 bilaterally  . CAD (coronary artery disease)    nonobstructive. Last cardiac cath (2008) showing left circumflex with mid 50% stenosis and distal luminal irregularities. Also with RCA with mid to distal 30-40% stenosis. // Previously evaluated by [facility], never followed up outpatient.  Marland Kitchen CAP (community acquired pneumonia) 06/15/2012   05/2012 CXR: Mild opacification of the posterior lung base on the lateral film  as cannot exclude infection/atelectasis.    . CHF  (congestive heart failure) ([place])   . Diabetes mellitus 2007   Type II, insulin dependent  . Diabetic retinopathy   . GERD (gastroesophageal reflux disease)   . Glaucoma   . Hyperlipidemia   . Hypertension   . Obesity   . Peripheral neuropathy     Patient Active Problem List   Diagnosis Date Noted  . Non-ST elevation (NSTEMI) myocardial infarction ([place])   . Shortness of breath 05/21/2018  . Chronic lower back pain 02/21/2018  . Chronic pain of right knee 01/23/2018  . Cough 08/23/2017  . Rib pain on right side 06/28/2017  . Lateral pain of right hip 04/27/2017  . Right foot ulcer ([place]) 10/26/2016  . Constipation 09/30/2016  . History of colonic polyps 09/30/2016  . Right foot pain 07/08/2016  . Non-alcoholic fatty liver disease 05/13/2016  . Spinal stenosis in cervical region 09/29/2015  . Bilateral low back pain with sciatica   . Long-term current use of opiate analgesic 07/11/2014  . Onychomycosis 10/29/2013  . Severe nonproliferative diabetic retinopathy ([place]) 04/04/2013  . Primary open angle glaucoma 04/04/2013  . Breast cancer screening 12/05/2012  . Pain and swelling of left knee 10/29/2012  . Insomnia 06/15/2012  . Chronic combined systolic and diastolic heart failure ([place]) 01/30/2012  . CAD (coronary artery disease) 11/02/2011  . Preventative health care 07/13/2011  . Adhesive capsulitis of right shoulder 11/16/2010  . Gastroesophageal reflux disease 10/12/2009  . Uncontrolled type 2 diabetes with  neuropathy ([place]) 06/12/2006  . Hyperlipidemia 06/12/2006  . OBESITY 06/12/2006  . Essential hypertension 06/12/2006    Past Surgical History:  Procedure Laterality Date  . ANTERIOR CERVICAL DECOMP/DISCECTOMY FUSION N/A 06/16/2016   Procedure: Anterior Cervical Decompression/discectomy Fusion - Cervical six - Cervical seven;  Surgeon: Eustace Moore, MD;  Location: [place];  Service: Neurosurgery;  Laterality: N/A;  Anterior Cervical Decompression/discectomy Fusion -  Cervical six - Cervical seven  . APPENDECTOMY    . CARDIAC CATHETERIZATION    . CATARACT EXTRACTION    . GLAUCOMA SURGERY    . LEFT HEART CATH AND CORONARY ANGIOGRAPHY N/A 05/22/2018   Procedure: LEFT HEART CATH AND CORONARY ANGIOGRAPHY;  Surgeon: Troy Sine, MD;  Location: [place] CV LAB;  Service: Cardiovascular;  Laterality: N/A;  . LEFT HEART CATHETERIZATION WITH CORONARY ANGIOGRAM N/A 01/24/2012   Procedure: LEFT HEART CATHETERIZATION WITH CORONARY ANGIOGRAM;  Surgeon: Sherren Mocha, MD;  Location: [facility] CATH LAB;  Service: Cardiovascular;  Laterality: N/A;  . POSTERIOR CERVICAL FUSION/FORAMINOTOMY N/A 10/22/2015   Procedure: Cervical three-Cervical Seven Posterior cervical fusion with lateral mass fixation,  Laminectomy Cervical Three-Cervical Seven;  Surgeon: Eustace Moore, MD;  Location: [place] NEURO ORS;  Service: Neurosurgery;  Laterality: N/A;  Posterior  . TUBAL LIGATION       OB History   No obstetric history on file.      Home Medications    Prior to Admission medications   Medication Sig Start Date End Date Taking? Authorizing Provider  amitriptyline (ELAVIL) 25 MG tablet Take 1 tablet (25 mg total) by mouth at bedtime. 01/10/18   Axel Filler, MD  aspirin EC 81 MG EC tablet Take 1 tablet (81 mg total) by mouth daily. 05/01/15   Strader, Fransisco Hertz, PA-C  atorvastatin (LIPITOR) 80 MG tablet Take 1 tablet (80 mg total) by mouth daily at 6 PM. 08/21/18   Richardson Dopp T, PA-C  clopidogrel (PLAVIX) 75 MG tablet Take 1 tablet (75 mg total) by mouth daily. 08/21/18   Kathlen Mody, Scott T, PA-C  COMBIGAN 0.2-0.5 % ophthalmic solution Place 1 drop into both eyes 2 (two) times daily. 03/22/18   [provider]  Continuous Blood Gluc Receiver (FREESTYLE LIBRE 14 DAY READER) DEVI 1 each by Does not apply route 4 (four) times daily. 12/26/17   Lucious Groves, DO  Continuous Blood Gluc Sensor (FREESTYLE LIBRE 14 DAY SENSOR) MISC 1 each by Does not apply route QID. 12/26/17    Lucious Groves, DO  cyclobenzaprine (FLEXERIL) 10 MG tablet TAKE 1 TABLET(10 MG) BY MOUTH TWICE DAILY AS NEEDED FOR MUSCLE SPASMS 05/07/18   Jean Rosenthal, MD  diclofenac sodium (VOLTAREN) 1 % GEL Apply 2 g topically 3 (three) times daily as needed. 01/23/18   Dorrell, Andree Elk, MD  diphenhydrAMINE (BENADRYL) 25 MG tablet Take 1 tablet (25 mg total) by mouth every 6 (six) hours as needed for itching or allergies. 06/19/18   Fawze, Mina A, PA-C  dorzolamide (TRUSOPT) 2 % ophthalmic solution Place 1 drop into both eyes 2 (two) times daily. 03/22/18   [provider]  famotidine (PEPCID) 20 MG tablet Take 1 tablet (20 mg total) by mouth 2 (two) times daily as needed for indigestion (throat tightness). 06/19/18   Fawze, Mina A, PA-C  fluticasone (FLONASE) 50 MCG/ACT nasal spray Place 1 spray into both nostrils daily. 11/10/17 03/27/19  Collier Salina, MD  furosemide (LASIX) 20 MG tablet Take 20 mg by mouth 2 (two) times daily.  [provider]  gabapentin (NEURONTIN) 300 MG capsule Take 1 capsule (300 mg total) by mouth 3 (three) times daily. 01/10/18   Axel Filler, MD  glucose blood [facility] VERIO) test strip USE TO CHECK BLOOD SUGAR 4 TIMES DAILY AS DIRECTED 01/30/18   Axel Filler, MD  Insulin Pen Needle 32G X 4 MM MISC Use to inject insulin up to 4 times daily 01/29/18   Agyei, Caprice Kluver, MD  IRON PO Take 1 tablet by mouth 3 (three) times daily.    [provider]  isosorbide mononitrate (IMDUR) 60 MG 24 hr tablet Take 30 mg by mouth daily.     [provider]  latanoprost (XALATAN) 0.005 % ophthalmic solution Place 1 drop into both eyes 2 (two) times a day. 09/13/18   [provider]  lisinopril (ZESTRIL) 2.5 MG tablet Take 1 tablet (2.5 mg total) by mouth daily. 03/27/19   Richardson Dopp T, PA-C  metFORMIN (GLUCOPHAGE) 1000 MG tablet TAKE 1 TABLET BY MOUTH TWICE DAILY WITH A MEAL 05/01/18   Agyei, Caprice Kluver, MD  metoprolol tartrate  (LOPRESSOR) 50 MG tablet Take 1.5 tablets (75 mg total) by mouth 2 (two) times daily. 06/05/18   Kathlen Mody, Scott T, PA-C  NOVOLOG FLEXPEN 100 UNIT/ML FlexPen INJECT 12 UNITS UNDER THE SKIN THREE TIMES DAILY WITH MEALS 04/12/18   Oda Kilts, MD  [facility] DELICA LANCETS FINE MISC Check blood sugar as instructed up to 3 times a day 03/14/16   Collier Salina, MD  oxyCODONE-acetaminophen (PERCOCET/ROXICET) 5-325 MG tablet Take 1 tablet by mouth every 8 (eight) hours as needed for severe pain. 04/27/18   Aldine Contes, MD  pantoprazole (PROTONIX) 40 MG tablet Take 1 tablet (40 mg total) by mouth daily. 03/08/18   Jean Rosenthal, MD  SANTYL ointment Apply 1 application topically daily. 08/22/18   [provider]  TRESIBA FLEXTOUCH 200 UNIT/ML SOPN INJECT 30 UNITS INTO THE SKIN DAILY 04/06/18   Jean Rosenthal, MD  triamcinolone cream (KENALOG) 0.1 % Apply 1 application topically as needed. 09/27/18   [provider]  VICTOZA 18 MG/3ML SOPN INJECT 1.2 MG TOTAL INTO THE SKIN DAILY 12/13/17   Annia Belt, MD    Family History Family History  Problem Relation Age of Onset  . Heart disease Mother   . Hypertension Sister   . Hypertension Sister   . Hypertension Sister   . Heart attack Neg Hx   . Stroke Neg Hx     Social History Social History   Tobacco Use  . Smoking status: Former Smoker    Packs/day: 0.30    Years: 7.00    Pack years: 2.10    Types: Cigarettes    Quit date: 06/27/2006    Years since quitting: 12.9  . Smokeless tobacco: Never Used  Substance Use Topics  . Alcohol use: No    Alcohol/week: 0.0 standard drinks  . Drug use: No     Allergies   Patient has no known allergies.   Review of Systems Review of Systems  All other systems reviewed and are negative.    Physical Exam Updated Vital Signs BP (!) 173/65   Pulse 74   Temp 98 F (36.7 C) (Oral)   Resp 16   LMP 02/13/2011   SpO2 99%   Physical Exam Vitals signs and nursing  note reviewed.  Constitutional:      General: She is not in acute distress.    Appearance: She is  well-developed. She is obese. She is not ill-appearing, toxic-appearing or diaphoretic.  HENT:     Head: Normocephalic and atraumatic.  Eyes:     Extraocular Movements: Extraocular movements intact.     Pupils: Pupils are equal, round, and reactive to light.  Neck:     Musculoskeletal: Normal range of motion.  Cardiovascular:     Rate and Rhythm: Normal rate and regular rhythm.     Heart sounds: Normal heart sounds.  Pulmonary:     Breath sounds: Normal breath sounds.  Chest:     Chest wall: Tenderness present. No mass.    Musculoskeletal: Normal range of motion.     Right lower leg: No edema.     Left lower leg: No edema.  Skin:    General: Skin is warm and dry.  Neurological:     General: No focal deficit present.     Mental Status: She is alert and oriented to person, place, and time.     Cranial Nerves: No cranial nerve deficit.     Motor: No weakness.  Psychiatric:        Mood and Affect: Mood normal.        Behavior: Behavior normal.      ED Treatments / Results  Labs (all labs ordered are listed, but only abnormal results are displayed) Labs Reviewed  CBC - Abnormal; Notable for the following components:      Result Value   WBC 13.8 (*)    All other components within normal limits  BASIC METABOLIC PANEL  HEPATIC FUNCTION PANEL  I-STAT BETA HCG BLOOD, ED ([facility], [facility], [facility] ONLY)  TROPONIN I (HIGH SENSITIVITY)    EKG EKG Interpretation  Date/Time:  Thursday May 30 2019 07:59:42 EST Ventricular Rate:  74 PR Interval:  152 QRS Duration: 88 QT Interval:  402 QTC Calculation: 446 R Axis:   25 Text Interpretation: Normal sinus rhythm Normal ECG Confirmed by Pattricia Boss [phone]) on 05/30/2019 8:21:26 AM   Radiology Dg Chest 2 View  Result Date: 05/30/2019 CLINICAL DATA:  Chest pain with dizziness and weakness. EXAM: CHEST - 2 VIEW COMPARISON:  06/19/2018  FINDINGS: Cardiac silhouette is normal in size. Normal mediastinal and hilar contours. Clear lungs.  No pleural effusion or pneumothorax. Status post cervical spine fusion, stable. Skeletal structures are intact. IMPRESSION: No active cardiopulmonary disease. Electronically Signed   By: Lajean Manes M.D.   On: 05/30/2019 08:37    Procedures Procedures (including critical care time)  Medications Ordered in ED Medications  sodium chloride flush (NS) 0.9 % injection 3 mL (has no administration in time range)  aspirin chewable tablet 324 mg (has no administration in time range)  fentaNYL (SUBLIMAZE) injection 50 mcg (has no administration in time range)     Initial Impression / Assessment and Plan / ED Course  I have reviewed the triage vital signs and the nursing notes.  Pertinent labs & imaging results that were available during my care of the patient were reviewed by me and considered in my medical decision making (see chart for details).        11:23 AM Improved after pain medicine.  Plan repeat troponin. 59 year old female with known history of coronary artery disease presents today with chest pain that has been ongoing for a week and has not been gone for a significant amount of time for the past 2 days.  EKG is unchanged from prior.  Based on her heart score of 4, a repeat troponin that is negative  would allow for discharge.  Patient's pain is very reproducible and I have a low index of suspicion that this is cardiac in nature.  I have discussed the above with the patient.  She voices understanding of the plan.  She has been noncompliant with her aspirin and I have discussed the need for compliance with this. 11:59 AM Repeat troponin is 10, which is within normal limits and has no significant delta troponin.  Patient has remained stable here in the ED.  I am discharge. Final Clinical Impressions(s) / ED Diagnoses   Final diagnoses:  Chest wall pain  Chest pain, unspecified type     ED Discharge Orders    None       Pattricia Boss, MD 05/30/19 1201

## 2019-05-30 NOTE — Discharge Instructions (Addendum)
Please restart your aspirin as prescribed previously by her cardiologist. Your chest pain today appears to be musculoskeletal in nature, however please seek reevaluation if you are worse at any time.

## 2019-05-30 NOTE — ED Notes (Addendum)
Pt c/o chest pain and dizziness that started last Thursday. Pt states she fell last night due to dizziness, no injuries reported. Pt reports nausea, no emesis currently. A&O x4.

## 2019-06-13 ENCOUNTER — Other Ambulatory Visit: Payer: Self-pay | Admitting: Family

## 2019-06-13 DIAGNOSIS — Z1231 Encounter for screening mammogram for malignant neoplasm of breast: Secondary | ICD-10-CM

## 2019-06-17 ENCOUNTER — Other Ambulatory Visit: Payer: Self-pay

## 2019-06-17 MED ORDER — ISOSORBIDE MONONITRATE ER 60 MG PO TB24: 30.0000 mg | ORAL_TABLET | Freq: Every day | ORAL | 2 refills | Status: DC

## 2019-07-01 ENCOUNTER — Other Ambulatory Visit: Payer: Self-pay | Admitting: Physician Assistant

## 2019-07-30 ENCOUNTER — Telehealth: Payer: Self-pay | Admitting: Physician Assistant

## 2019-07-30 ENCOUNTER — Other Ambulatory Visit: Payer: Self-pay | Admitting: Physician Assistant

## 2019-07-30 ENCOUNTER — Other Ambulatory Visit: Payer: Self-pay

## 2019-07-30 ENCOUNTER — Ambulatory Visit (INDEPENDENT_AMBULATORY_CARE_PROVIDER_SITE_OTHER): Payer: Medicare HMO | Admitting: Physician Assistant

## 2019-07-30 ENCOUNTER — Encounter: Payer: Self-pay | Admitting: Physician Assistant

## 2019-07-30 VITALS — BP 104/50 | HR 97 | Ht 66.0 in | Wt 254.6 lb

## 2019-07-30 DIAGNOSIS — I25119 Atherosclerotic heart disease of native coronary artery with unspecified angina pectoris: Secondary | ICD-10-CM | POA: Diagnosis not present

## 2019-07-30 DIAGNOSIS — E118 Type 2 diabetes mellitus with unspecified complications: Secondary | ICD-10-CM | POA: Diagnosis not present

## 2019-07-30 DIAGNOSIS — E785 Hyperlipidemia, unspecified: Secondary | ICD-10-CM

## 2019-07-30 DIAGNOSIS — I10 Essential (primary) hypertension: Secondary | ICD-10-CM | POA: Diagnosis not present

## 2019-07-30 DIAGNOSIS — I5022 Chronic systolic (congestive) heart failure: Secondary | ICD-10-CM

## 2019-07-30 DIAGNOSIS — Z794 Long term (current) use of insulin: Secondary | ICD-10-CM

## 2019-07-30 MED ORDER — NITROGLYCERIN 0.4 MG SL SUBL: 0.4000 mg | SUBLINGUAL_TABLET | SUBLINGUAL | 11 refills | Status: DC | PRN

## 2019-07-30 MED ORDER — RANOLAZINE ER 500 MG PO TB12: 500.0000 mg | ORAL_TABLET | Freq: Two times a day (BID) | ORAL | 11 refills | Status: DC

## 2019-07-30 NOTE — Progress Notes (Signed)
Cardiology Office Note:    Date:  07/30/2019   ID:  [name], DOB 1961/02/03, MRN [id number]  PCP:  Tsosie Billing, MD (Inactive)  Cardiologist:  Sherren Mocha, MD   Electrophysiologist:  None   Referring MD: No ref. provider found   Chief Complaint:  Chest Pain    Patient Profile:    [name] is a 59 y.o. female with:   Coronary artery disease  ? NSTEMI 04/2018 >> Cath w/ diffuse disease >> no targets for CABG >> Med Rx  Cath: pLAD 50, mLAD 80, dLAD 70; oD1 95, mLCx 90, dLCx 80, OM2 95, mRCA 30, dRCA 30  PCI could be considered if medical Rx failed  Chronic Systolic CHF ? Echocardiogram 7/13: EF 35-40 ? Echocardiogram 11/16: EF 55-60 ? Echocardiogram 8/18: EF 50-55 ? Echocardiogram 11/19: EF 45-50 ? Non-ischemic cardiomyopathy (CM out of proportion to extensive CAD)   Morbid obesity   Diabetes mellitus   Hypertension   Hyperlipidemia   Lung nodule  Prior CV studies: Echo 05/23/18 Mild conc LVH, EF 45-50, diff HK, normal RVSF  Cardiac Catheterization11/26/19 LAD prox 50, mid 80, dist 70; D1 ost 95 LCx mid 90, dist 80; OM2 95 RCA mid 30, dist 30 EF 25-35, diff HK involving mid distal anterolateral wall and apex extending to mid distal portion of inf wall RECOMMENDATION: Angiograms were reviewed with colleagues. In comparison to the prior study of 2013, there is now significant progression of CAD with diffuse stenoses in the diagonal vessel mid distal LAD, distal circumflex marginal, mid distal AV groove circumflex vessels. Due to the distal disease CABG revascularization is not favorable. The patient will be gently hydrated post procedure. She will be started on aggressive medical management initially and if medical therapy fails consider staged PCI. The patient will be started on carvedilol, nitrates, consider amlodipine and possible ranolazine. If she is not felt to be an operative candidate dual antiplatelet therapy will be  beneficial.   Echo 02/07/17 EF 50-55, no RWMA, Gr 1 DD, normal RVSF  Echo 05/01/15 Mild LVH, EF 55-60, no RWMA, Gr 1 DD  Echocardiogram 7/13:  limited study, EF 35-40%.   LHC 7/13:  Mid LAD 50% within a myocardial bridge, EF 40-45%.  History of Present Illness:    [name] was last seen in 02/2019.  She returns for the evaluation of chest pain.  This has been worsening for the past 2 weeks.  It is a left sided cramping sensation.  She gets it at rest.  Of note, she can do things like vacuuming or wash dishes without chest discomfort.  However, she does get short of breath easily at times.  She has not had any radiating symptoms or associated nausea.  She has had some diaphoresis.  She has not had orthopnea or leg swelling or syncope.  She does get dizzy at times.  Of note, her niece recently passed away and the funeral was yesterday.  She has been under a great deal of stress.     Past Medical History:  Diagnosis Date  . Adhesive capsulitis of shoulder    bilateral, Steroid injection Dr. Truman Hayward 1/12 bilaterally  . CAD (coronary artery disease)    nonobstructive. Last cardiac cath (2008) showing left circumflex with mid 50% stenosis and distal luminal irregularities. Also with RCA with mid to distal 30-40% stenosis. // Previously evaluated by [facility], never followed up outpatient.  Marland Kitchen CAP (community acquired pneumonia) 06/15/2012   05/2012 CXR: Mild opacification  of the posterior lung base on the lateral film  as cannot exclude infection/atelectasis.    . CHF (congestive heart failure) ([place])   . Diabetes mellitus 2007   Type II, insulin dependent  . Diabetic retinopathy   . GERD (gastroesophageal reflux disease)   . Glaucoma   . Hyperlipidemia   . Hypertension   . Obesity   . Peripheral neuropathy     Current Medications: Current Meds  Medication Sig  . amitriptyline (ELAVIL) 25 MG tablet Take 1 tablet (25 mg total) by mouth at bedtime.  Marland Kitchen aspirin EC 81 MG EC  tablet Take 1 tablet (81 mg total) by mouth daily.  Marland Kitchen atorvastatin (LIPITOR) 80 MG tablet Take 1 tablet (80 mg total) by mouth daily at 6 PM.  . clopidogrel (PLAVIX) 75 MG tablet Take 1 tablet (75 mg total) by mouth daily.  . COMBIGAN 0.2-0.5 % ophthalmic solution Place 1 drop into both eyes 2 (two) times daily.  . Continuous Blood Gluc Receiver (FREESTYLE LIBRE 14 DAY READER) DEVI 1 each by Does not apply route 4 (four) times daily.  . Continuous Blood Gluc Sensor (FREESTYLE LIBRE 14 DAY SENSOR) MISC 1 each by Does not apply route QID.  Marland Kitchen cyclobenzaprine (FLEXERIL) 10 MG tablet TAKE 1 TABLET(10 MG) BY MOUTH TWICE DAILY AS NEEDED FOR MUSCLE SPASMS  . diclofenac sodium (VOLTAREN) 1 % GEL Apply 2 g topically 3 (three) times daily as needed.  . diphenhydrAMINE (BENADRYL) 25 MG tablet Take 1 tablet (25 mg total) by mouth every 6 (six) hours as needed for itching or allergies.  Marland Kitchen dorzolamide (TRUSOPT) 2 % ophthalmic solution Place 1 drop into both eyes 2 (two) times daily.  . famotidine (PEPCID) 20 MG tablet Take 1 tablet (20 mg total) by mouth 2 (two) times daily as needed for indigestion (throat tightness).  . furosemide (LASIX) 20 MG tablet Take 20 mg by mouth 2 (two) times daily.  Marland Kitchen gabapentin (NEURONTIN) 300 MG capsule Take 1 capsule (300 mg total) by mouth 3 (three) times daily.  Marland Kitchen glucose blood (ONETOUCH VERIO) test strip USE TO CHECK BLOOD SUGAR 4 TIMES DAILY AS DIRECTED  . insulin aspart (NOVOLOG PENFILL) cartridge Inject 15 Units into the skin 2 (two) times daily with a meal.  . Insulin Pen Needle 32G X 4 MM MISC Use to inject insulin up to 4 times daily  . IRON PO Take 1 tablet by mouth 3 (three) times daily.  . isosorbide mononitrate (IMDUR) 60 MG 24 hr tablet Take 60 mg by mouth daily.  Marland Kitchen lisinopril (ZESTRIL) 2.5 MG tablet Take 1 tablet (2.5 mg total) by mouth daily.  . metFORMIN (GLUCOPHAGE) 1000 MG tablet TAKE 1 TABLET BY MOUTH TWICE DAILY WITH A MEAL  . metoprolol tartrate (LOPRESSOR)  50 MG tablet Take 1.5 tablets (75 mg total) by mouth 2 (two) times daily.  Glory Rosebush DELICA LANCETS FINE MISC Check blood sugar as instructed up to 3 times a day  . oxyCODONE-acetaminophen (PERCOCET/ROXICET) 5-325 MG tablet Take 1 tablet by mouth every 8 (eight) hours as needed for severe pain.  . pantoprazole (PROTONIX) 40 MG tablet Take 1 tablet (40 mg total) by mouth daily.  Marland Kitchen SANTYL ointment Apply 1 application topically daily as needed (foot).   . TRESIBA FLEXTOUCH 200 UNIT/ML SOPN INJECT 30 UNITS INTO THE SKIN DAILY  . triamcinolone cream (KENALOG) 0.1 % Apply 1 application topically as needed.  Marland Kitchen VICTOZA 18 MG/3ML SOPN INJECT 1.2 MG TOTAL INTO THE SKIN DAILY  Allergies:   Patient has no known allergies.   Social History   Tobacco Use  . Smoking status: Former Smoker    Packs/day: 0.30    Years: 7.00    Pack years: 2.10    Types: Cigarettes    Quit date: 06/27/2006    Years since quitting: 13.0  . Smokeless tobacco: Never Used  Substance Use Topics  . Alcohol use: No    Alcohol/week: 0.0 standard drinks  . Drug use: No     Family Hx: The patient's family history includes Heart disease in her mother; Hypertension in her sister, sister, and sister. There is no history of Heart attack or Stroke.  Review of Systems  Constitution: Negative for chills and fever.  Respiratory: Negative for cough.   Gastrointestinal: Negative for diarrhea, heartburn, hematochezia, melena and vomiting.  Genitourinary: Negative for hematuria.  Neurological: Positive for loss of balance.     EKGs/Labs/Other Test Reviewed:    EKG:  EKG is   ordered today.  The ekg ordered today demonstrates normal sinus rhythm, heart rate 85, normal axis, decreased voltage in precordial leads, no acute ST-T wave changes, QTC 447  Recent Labs: 05/30/2019: ALT 16; BUN 15; Creatinine, Ser 1.06; Hemoglobin 12.0; Platelets 322; Potassium 3.6; Sodium 138   Recent Lipid Panel Lab Results  Component Value  Date/Time   CHOL 116 07/17/2018 11:05 AM   TRIG 83 07/17/2018 11:05 AM   HDL 51 07/17/2018 11:05 AM   CHOLHDL 2.3 07/17/2018 11:05 AM   CHOLHDL 4.1 05/21/2018 06:00 PM   LDLCALC 48 07/17/2018 11:05 AM    Physical Exam:    VS:  BP (!) 104/50   Pulse 97   Ht 5\' 6"  (1.676 m)   Wt 254 lb 9.6 oz (115.5 kg)   LMP 02/13/2011   SpO2 97%   BMI 41.09 kg/m     Wt Readings from Last 3 Encounters:  07/30/19 254 lb 9.6 oz (115.5 kg)  03/27/19 266 lb (120.7 kg)  10/18/18 264 lb 6.4 oz (119.9 kg)     Constitutional:      Appearance: Healthy appearance. Not in distress.  Neck:     Thyroid: Thyroid normal.     Vascular: JVD normal.  Pulmonary:     Effort: Pulmonary effort is normal.     Breath sounds: No wheezing. No rales.  Chest:     Chest wall: Tender to palpatation (pain is not the same she came in for today).  Cardiovascular:     Normal rate. Regular rhythm. Normal S1. Normal S2.     Murmurs: There is no murmur.  Edema:    Peripheral edema absent.  Abdominal:     Palpations: Abdomen is soft. There is no hepatomegaly.     Tenderness: There is no abdominal tenderness.  Skin:    General: Skin is warm and dry.  Neurological:     Mental Status: Alert and oriented to person, place and time.     Cranial Nerves: Cranial nerves are intact.      ASSESSMENT & PLAN:    1. Coronary artery disease involving native coronary artery of native heart with angina pectoris [facility]) S/p NSTEMI in November 2019.Cardiac catheterization demonstrated diffuse small vessel and distal vessel disease. The culprit was felt to be the distal OM1 or the first diagonal. Both vessels are small in caliber and are not likely amenable to PCI. Her anatomy is also not amenable to CABG. She has been managed medically.  At the time of her  catheterization, it was suggested that she could undergo staged PCI if she fails medical therapy.  Up until now, she has done well.  Her current chest symptoms have some typical  features as well as some atypical features for ischemia.  She also has some chest soreness which is different from the pain she came in with today.  As she did have diffuse disease at her cardiac catheterization and she does have diabetes, I suspect her symptoms are probably related to angina.  Her ECG does not demonstrate any acute findings and her symptoms do not seem to be unstable.  Her blood pressure will not tolerate further medication titration.  I have recommended that we try her on ranolazine and bring her back for close follow-up.  -Continue aspirin, clopidogrel, atorvastatin, metoprolol 75 mg twice daily and isosorbide 60 mg daily  -Start ranolazine 500 mg twice daily  -Follow-up with me in 1 to 2 weeks  -Consider cardiac catheterization if symptoms are no better or worsening  -I have also given her prescription for as needed nitroglycerin.   -She knows to contact us if she has significantly worsening symptoms.  2. Chronic systolic CHF (congestive heart failure) (HCC) EF 45-50.  NYHA IIb-IIIa.  Volume status seems to be stable.  Continue current dose of beta-blocker, ACE inhibitor, nitrates, loop diuretic.  3. Essential hypertension The patient's blood pressure is controlled on her current regimen.  Continue current therapy.   4. Type 2 diabetes mellitus with complication, with long-term current use of insulin ([place]) She notes that she has been referred to an endocrinologist for further management of her diabetes.  5. Hyperlipidemia, unspecified hyperlipidemia type LDL optimal on most recent lab work.  Continue current Rx.      Dispo:  Return in about 2 weeks (around 08/13/2019) for Close Follow Up, w/ Richardson Dopp, PA-C, in person.   Medication Adjustments/Labs and Tests Ordered: Current medicines are reviewed at length with the patient today.  Concerns regarding medicines are outlined above.  Tests Ordered: Orders Placed This Encounter  Procedures  . EKG 12-Lead   Medication  Changes: Meds ordered this encounter  Medications  . ranolazine (RANEXA) 500 MG 12 hr tablet    Sig: Take 1 tablet (500 mg total) by mouth 2 (two) times daily.    Dispense:  60 tablet    Refill:  11    Order Specific Question:   Supervising Provider    Answer:   Lelon Perla [[id number]]  . nitroGLYCERIN (NITROSTAT) 0.4 MG SL tablet    Sig: Place 1 tablet (0.4 mg total) under the tongue every 5 (five) minutes as needed for chest pain.    Dispense:  25 tablet    Refill:  11    Order Specific Question:   Supervising Provider    Answer:   Lelon Perla [[id number]]    Signed, Richardson Dopp, PA-C  07/30/2019 3:00 PM    [facility] [address], [place], [place]  [address] Phone: [phone]; Fax: [phone]

## 2019-07-30 NOTE — Telephone Encounter (Signed)
See office visit note from 07/30/2019. Richardson Dopp, PA-C    07/30/2019 3:57 PM

## 2019-07-30 NOTE — Telephone Encounter (Signed)
Pt c/o of Chest Pain: 1. Are you having CP right now? No  2. Are you experiencing any other symptoms (ex. SOB, nausea, vomiting, sweating)? Sweating  3. How long have you been experiencing CP? About a week over time is is getting worse. 4. Is your CP continuous or coming and going? Yes  5. Have you taken Nitroglycerin? No

## 2019-07-30 NOTE — Telephone Encounter (Signed)
Pt called complaining of left sided chest discomfort like a cramping with exertion and sometimes at rest. She reports having dizziness with position changes but no SOB and no headache, cough, or fever.   Pt says it has been happening on and off for several weeks but noticed it has been increasing in intensity over the past week and occurring more often. It woke her up last night but she was able to fall back asleep without worsening pain.   With the pt h/o CAD she accepted an appt this afternoon to see Richardson Dopp, PA however, if anything worsens prior to that appt she will call EMS or go to the ED. I explained to her best to have somebody drive her and wait for her in the car.       COVID-19 Pre-Screening Questions:  . In the past 7 to 10 days have you had a cough,  shortness of breath, headache, congestion, fever (100 or greater) body aches, chills, sore throat, or sudden loss of taste or sense of smell? NO . Have you been around anyone with known Covid 19. NO . Have you been around anyone who is awaiting Covid 19 test results in the past 7 to 10 days? NO . Have you been around anyone who has been exposed to Covid 19, or has mentioned symptoms of Covid 19 within the past 7 to 10 days? NO  If you have any concerns/questions about symptoms patients report during screening (either on the phone or at threshold). Contact the provider seeing the patient or DOD for further guidance.  If neither are available contact a member of the leadership team.

## 2019-07-30 NOTE — Patient Instructions (Signed)
Medication Instructions:   Your physician has recommended you make the following change in your medication:   1) Start Ranexa 500 mg, 1 tablet by mouth twice a day 2) Start Nitroglycerin 0.4 mg, 1 tablet under the tongue as needed for chest pain  *If you need a refill on your cardiac medications before your next appointment, please call your pharmacy*  Lab Work:  None ordered today  If you have labs (blood work) drawn today and your tests are completely normal, you will receive your results only by: Marland Kitchen MyChart Message (if you have MyChart) OR . A paper copy in the mail If you have any lab test that is abnormal or we need to change your treatment, we will call you to review the results.  Testing/Procedures:  None ordered today  Follow-Up: At [facility], you and your health needs are our priority.  As part of our continuing mission to provide you with exceptional heart care, we have created designated Provider Care Teams.  These Care Teams include your primary Cardiologist (physician) and Advanced Practice Providers (APPs -  Physician Assistants and Nurse Practitioners) who all work together to provide you with the care you need, when you need it.  Your next appointment:    On 08/09/19 at 9:15AM with Richardson Dopp, PA-C

## 2019-08-01 NOTE — Progress Notes (Signed)
Cath films reviewed. I think PCI would be a reasonable option if she continues to have ischemic symptoms on medical therapy. Thx Scott

## 2019-08-02 ENCOUNTER — Ambulatory Visit: Payer: Medicare HMO

## 2019-08-08 NOTE — Progress Notes (Deleted)
Cardiology Office Note:    Date:  08/08/2019   ID:  [name], DOB [birth year], MRN [id number]  PCP:  Tsosie Billing, MD (Inactive)  Cardiologist:  Sherren Mocha, MD *** Electrophysiologist:  None   Referring MD: No ref. provider found   Chief Complaint:  No chief complaint on file.    Patient Profile:    [name] is a 59 y.o. female with:   Coronary artery disease  ? NSTEMI 04/2018 >> Cath w/ diffuse disease >> no targets for CABG >> Med Rx  Cath: pLAD 50, mLAD 80, dLAD 70; oD1 95, mLCx 90, dLCx 80, OM2 95, mRCA 30, dRCA 30  PCI could be considered if medical Rx failed  Chronic Systolic CHF ? Echocardiogram 7/13: EF 35-40 ? Echocardiogram 11/16: EF 55-60 ? Echocardiogram 8/18: EF 50-55 ? Echocardiogram 11/19: EF 45-50 ? Non-ischemic cardiomyopathy (CM out of proportion to extensive CAD)   Morbid obesity   Diabetes mellitus   Hypertension   Hyperlipidemia   Lung nodule  Prior CV studies: Echo 05/23/18 Mild conc LVH, EF 45-50, diff HK, normal RVSF  Cardiac Catheterization11/26/19 LAD prox 50, mid 80, dist 70; D1 ost 95 LCx mid 90, dist 80; OM2 95 RCA mid 30, dist 30 EF 25-35, diff HK involving mid distal anterolateral wall and apex extending to mid distal portion of inf wall RECOMMENDATION: Angiograms were reviewed with colleagues. In comparison to the prior study of 2013, there is now significant progression of CAD with diffuse stenoses in the diagonal vessel mid distal LAD, distal circumflex marginal, mid distal AV groove circumflex vessels. Due to the distal disease CABG revascularization is not favorable. The patient will be gently hydrated post procedure. She will be started on aggressive medical management initially and if medical therapy fails consider staged PCI. The patient will be started on carvedilol, nitrates, consider amlodipine and possible ranolazine. If she is not felt to be an operative candidate dual antiplatelet  therapy will be beneficial.   Echo 02/07/17 EF 50-55, no RWMA, Gr 1 DD, normal RVSF  Echo 05/01/15 Mild LVH, EF 55-60, no RWMA, Gr 1 DD  Echocardiogram 7/13:  limited study, EF 35-40%.   LHC 7/13:  Mid LAD 50% within a myocardial bridge, EF 40-45%.  History of Present Illness:    [name] was last seen 07/30/2019.  She was having symptoms of progressive angina.  I started her on Ranolazine and brought her back for close follow up.  Dr. Burt Knack has reviewed her prior cath and noted that PCI is reasonable if she has persistent ischemic symptoms.    The DICTATELATER SmartLink is not supported in this context. ***   Past Medical History:  Diagnosis Date   Adhesive capsulitis of shoulder    bilateral, Steroid injection Dr. Truman Hayward 1/12 bilaterally   CAD (coronary artery disease)    nonobstructive. Last cardiac cath (2008) showing left circumflex with mid 50% stenosis and distal luminal irregularities. Also with RCA with mid to distal 30-40% stenosis. // Previously evaluated by [facility], never followed up outpatient.   CAP (community acquired pneumonia) 06/15/2012   05/2012 CXR: Mild opacification of the posterior lung base on the lateral film  as cannot exclude infection/atelectasis.     CHF (congestive heart failure) ([place])    Diabetes mellitus 2007   Type II, insulin dependent   Diabetic retinopathy    GERD (gastroesophageal reflux disease)    Glaucoma    Hyperlipidemia    Hypertension    Obesity  Peripheral neuropathy     Current Medications: No outpatient medications have been marked as taking for the 08/09/19 encounter (Appointment) with Richardson Dopp T, PA-C.     Allergies:   Patient has no known allergies.   Social History   Tobacco Use   Smoking status: Former Smoker    Packs/day: 0.30    Years: 7.00    Pack years: 2.10    Types: Cigarettes    Quit date: 06/27/2006    Years since quitting: 13.1   Smokeless tobacco: Never Used  Substance  Use Topics   Alcohol use: No    Alcohol/week: 0.0 standard drinks   Drug use: No     Family Hx: The patient's family history includes Heart disease in her mother; Hypertension in her sister, sister, and sister. There is no history of Heart attack or Stroke.  ROS   EKGs/Labs/Other Test Reviewed:    EKG:  EKG is *** ordered today.  The ekg ordered today demonstrates ***  Recent Labs: 05/30/2019: ALT 16; BUN 15; Creatinine, Ser 1.06; Hemoglobin 12.0; Platelets 322; Potassium 3.6; Sodium 138   Recent Lipid Panel Lab Results  Component Value Date/Time   CHOL 116 07/17/2018 11:05 AM   TRIG 83 07/17/2018 11:05 AM   HDL 51 07/17/2018 11:05 AM   CHOLHDL 2.3 07/17/2018 11:05 AM   CHOLHDL 4.1 05/21/2018 06:00 PM   LDLCALC 48 07/17/2018 11:05 AM    Physical Exam:    VS:  LMP 02/13/2011     Wt Readings from Last 3 Encounters:  07/30/19 254 lb 9.6 oz (115.5 kg)  03/27/19 266 lb (120.7 kg)  10/18/18 264 lb 6.4 oz (119.9 kg)     Physical Exam ***  ASSESSMENT & PLAN:    *** 1. Coronary artery disease involving native coronary artery of native heart with angina pectoris [facility]) S/p NSTEMIin November 2019.Cardiac catheterization demonstrated diffuse small vessel and distal vessel disease. The culprit was felt to be the distal OM1 or the first diagonal. Both vessels are small in caliber and are not likely amenable to PCI. Her anatomy is also not amenable to CABG. She has been managed medically.  At the time of her catheterization, it was suggested that she could undergo staged PCI if she fails medical therapy.  Up until now, she has done well.  Her current chest symptoms have some typical features as well as some atypical features for ischemia.  She also has some chest soreness which is different from the pain she came in with today.  As she did have diffuse disease at her cardiac catheterization and she does have diabetes, I suspect her symptoms are probably related to angina.  Her ECG  does not demonstrate any acute findings and her symptoms do not seem to be unstable.  Her blood pressure will not tolerate further medication titration.  I have recommended that we try her on ranolazine and bring her back for close follow-up.             -Continue aspirin, clopidogrel, atorvastatin, metoprolol 75 mg twice daily and isosorbide 60 mg daily             -Start ranolazine 500 mg twice daily             -Follow-up with me in 1 to 2 weeks             -Consider cardiac catheterization if symptoms are no better or worsening             -  I have also given her prescription for as needed nitroglycerin.                     -She knows to contact us if she has significantly worsening symptoms.  2. Chronic systolic CHF (congestive heart failure) (HCC) EF 45-50.  NYHA IIb-IIIa.  Volume status seems to be stable.  Continue current dose of beta-blocker, ACE inhibitor, nitrates, loop diuretic.  3. Essential hypertension The patient's blood pressure is controlled on her current regimen.  Continue current therapy.   4. Type 2 diabetes mellitus with complication, with long-term current use of insulin ([place]) She notes that she has been referred to an endocrinologist for further management of her diabetes.  5. Hyperlipidemia, unspecified hyperlipidemia type LDL optimal on most recent lab work.  Continue current Rx.     Dispo:  No follow-ups on file.   Medication Adjustments/Labs and Tests Ordered: Current medicines are reviewed at length with the patient today.  Concerns regarding medicines are outlined above.  Tests Ordered: No orders of the defined types were placed in this encounter.  Medication Changes: No orders of the defined types were placed in this encounter.   Signed, Richardson Dopp, PA-C  08/08/2019 10:07 PM    [facility] [place], [place], [place]  [address] Phone: [phone]; Fax: [phone]

## 2019-08-09 ENCOUNTER — Ambulatory Visit: Payer: Medicare HMO | Admitting: Physician Assistant

## 2019-09-06 ENCOUNTER — Other Ambulatory Visit: Payer: Self-pay | Admitting: Family

## 2019-09-06 DIAGNOSIS — Z1231 Encounter for screening mammogram for malignant neoplasm of breast: Secondary | ICD-10-CM

## 2019-09-09 ENCOUNTER — Ambulatory Visit: Payer: Medicare HMO

## 2019-09-26 ENCOUNTER — Ambulatory Visit: Payer: Medicare HMO | Attending: Internal Medicine

## 2019-10-03 ENCOUNTER — Other Ambulatory Visit: Payer: Self-pay

## 2019-10-07 ENCOUNTER — Ambulatory Visit (INDEPENDENT_AMBULATORY_CARE_PROVIDER_SITE_OTHER): Payer: Medicare HMO | Admitting: Internal Medicine

## 2019-10-07 ENCOUNTER — Other Ambulatory Visit: Payer: Self-pay

## 2019-10-07 ENCOUNTER — Encounter: Payer: Self-pay | Admitting: Internal Medicine

## 2019-10-07 VITALS — BP 138/72 | HR 100 | Temp 98.5°F | Ht 66.0 in | Wt 266.2 lb

## 2019-10-07 DIAGNOSIS — E1159 Type 2 diabetes mellitus with other circulatory complications: Secondary | ICD-10-CM | POA: Diagnosis not present

## 2019-10-07 DIAGNOSIS — E1142 Type 2 diabetes mellitus with diabetic polyneuropathy: Secondary | ICD-10-CM

## 2019-10-07 DIAGNOSIS — E119 Type 2 diabetes mellitus without complications: Secondary | ICD-10-CM | POA: Insufficient documentation

## 2019-10-07 DIAGNOSIS — Z794 Long term (current) use of insulin: Secondary | ICD-10-CM

## 2019-10-07 DIAGNOSIS — E1165 Type 2 diabetes mellitus with hyperglycemia: Secondary | ICD-10-CM | POA: Diagnosis not present

## 2019-10-07 DIAGNOSIS — E1140 Type 2 diabetes mellitus with diabetic neuropathy, unspecified: Secondary | ICD-10-CM

## 2019-10-07 DIAGNOSIS — IMO0002 Reserved for concepts with insufficient information to code with codable children: Secondary | ICD-10-CM

## 2019-10-07 DIAGNOSIS — E785 Hyperlipidemia, unspecified: Secondary | ICD-10-CM

## 2019-10-07 DIAGNOSIS — E11319 Type 2 diabetes mellitus with unspecified diabetic retinopathy without macular edema: Secondary | ICD-10-CM | POA: Insufficient documentation

## 2019-10-07 LAB — POCT GLYCOSYLATED HEMOGLOBIN (HGB A1C): Hemoglobin A1C: 9.6 % — AB (ref 4.0–5.6)

## 2019-10-07 LAB — BASIC METABOLIC PANEL
BUN: 26 mg/dL — ABNORMAL HIGH (ref 6–23)
CO2: 25 mEq/L (ref 19–32)
Calcium: 9 mg/dL (ref 8.4–10.5)
Chloride: 100 mEq/L (ref 96–112)
Creatinine, Ser: 1.12 mg/dL (ref 0.40–1.20)
GFR: 59.89 mL/min — ABNORMAL LOW (ref >60.00)
Glucose, Bld: 200 mg/dL — ABNORMAL HIGH (ref 70–99)
Potassium: 3.7 mEq/L (ref 3.5–5.1)
Sodium: 137 mEq/L (ref 135–145)

## 2019-10-07 MED ORDER — JARDIANCE 10 MG PO TABS: 10.0000 mg | ORAL_TABLET | Freq: Every day | ORAL | 6 refills | Status: DC

## 2019-10-07 NOTE — Progress Notes (Signed)
Name: [name]  MRN/ DOB: [id number], 1960-09-05   Age/ Sex: 59 y.o., female    PCP: Rocco Serene, MD   Reason for Endocrinology Evaluation: Type 2 Diabetes Mellitus     Date of Initial Endocrinology Visit: 10/07/2019     PATIENT IDENTIFIER: [name] is a 59 y.o. female with a past medical history of Dm, HTN . The patient presented for initial endocrinology clinic visit on 10/07/2019 for consultative assistance with her diabetes management.    HPI: [name] was    Diagnosed with DM as gestational diabetes during 1984 pregnancy requiring insulin but was off until her next pregnancy in  1985 and has bee on treatment every since  diagnosed again with gestations  Prior Medications tried/Intolerance: Was on metformin only until 2000 when insulin was started  Currently checking blood sugars multiple times a day through CGM  Hypoglycemia episodes : yes              Symptoms: jittery, shaky            Frequency: 2/ weeks Hemoglobin A1c has ranged from 8.1% in 2016, peaking at 12.5% in 2020. Patient required assistance for hypoglycemia: no  Patient has required hospitalization within the last 1 year from hyper or hypoglycemia: no   In terms of diet, the patient eats 2 meals a day, has been avoiding sugar-sweetened beverages.    HOME DIABETES REGIMEN: Metformin 1000 mg BID  Novolog 15 units after meals Tresiba  30 units daily  Victoza 1.8 mg weekly    Statin: yes ACE-I/ARB: Yes Prior Diabetic Education: yes     CONTINUOUS GLUCOSE MONITORING RECORD INTERPRETATION    Dates of Recording: 3/30-4/05/2020  Sensor description:Freestyle libre  Results statistics:   CGM use % of time 56  Average and SD 157/42.7  Time in range     57   %  % Time Above 180 26  % Time above 250 8  % Time Below target 7      Glycemic patterns summary: hyperglycemia after supper   Hyperglycemic episodes    Hypoglycemic episodes occurred  Overnight periods:    Preprandial periods:       DIABETIC COMPLICATIONS: Microvascular complications:   Neuropathy, hx of left foot ulcer, retinopathy   Denies: CKD   Last eye exam: Completed 06/2019  Macrovascular complications:   CAD, CHF  Denies:  PVD, CVA   PAST HISTORY: Past Medical History:  Past Medical History:  Diagnosis Date  . Adhesive capsulitis of shoulder    bilateral, Steroid injection Dr. Truman Hayward 1/12 bilaterally  . CAD (coronary artery disease)    nonobstructive. Last cardiac cath (2008) showing left circumflex with mid 50% stenosis and distal luminal irregularities. Also with RCA with mid to distal 30-40% stenosis. // Previously evaluated by [facility], never followed up outpatient.  Marland Kitchen CAP (community acquired pneumonia) 06/15/2012   05/2012 CXR: Mild opacification of the posterior lung base on the lateral film  as cannot exclude infection/atelectasis.    . CHF (congestive heart failure) ([place])   . Diabetes mellitus 2007   Type II, insulin dependent  . Diabetic retinopathy   . GERD (gastroesophageal reflux disease)   . Glaucoma   . Hyperlipidemia   . Hypertension   . Obesity   . Peripheral neuropathy    Past Surgical History:  Past Surgical History:  Procedure Laterality Date  . ANTERIOR CERVICAL DECOMP/DISCECTOMY FUSION N/A 06/16/2016   Procedure: Anterior Cervical Decompression/discectomy Fusion - Cervical  six - Cervical seven;  Surgeon: Eustace Moore, MD;  Location: [facility];  Service: Neurosurgery;  Laterality: N/A;  Anterior Cervical Decompression/discectomy Fusion - Cervical six - Cervical seven  . APPENDECTOMY    . CARDIAC CATHETERIZATION    . CATARACT EXTRACTION    . GLAUCOMA SURGERY    . LEFT HEART CATH AND CORONARY ANGIOGRAPHY N/A 05/22/2018   Procedure: LEFT HEART CATH AND CORONARY ANGIOGRAPHY;  Surgeon: Troy Sine, MD;  Location: [facility];  Service: Cardiovascular;  Laterality: N/A;  . LEFT HEART CATHETERIZATION WITH CORONARY ANGIOGRAM N/A  01/24/2012   Procedure: LEFT HEART CATHETERIZATION WITH CORONARY ANGIOGRAM;  Surgeon: Sherren Mocha, MD;  Location: [facility] CATH LAB;  Service: Cardiovascular;  Laterality: N/A;  . POSTERIOR CERVICAL FUSION/FORAMINOTOMY N/A 10/22/2015   Procedure: Cervical three-Cervical Seven Posterior cervical fusion with lateral mass fixation,  Laminectomy Cervical Three-Cervical Seven;  Surgeon: Eustace Moore, MD;  Location: [facility] NEURO ORS;  Service: Neurosurgery;  Laterality: N/A;  Posterior  . TUBAL LIGATION        Social History:  reports that she quit smoking about 13 years ago. Her smoking use included cigarettes. She has a 2.10 pack-year smoking history. She has never used smokeless tobacco. She reports that she does not drink alcohol or use drugs. Family History:  Family History  Problem Relation Age of Onset  . Heart disease Mother   . Hypertension Sister   . Hypertension Sister   . Hypertension Sister   . Heart attack Neg Hx   . Stroke Neg Hx      HOME MEDICATIONS: Allergies as of 10/07/2019   No Known Allergies     Medication List       Accurate as of October 07, 2019  2:22 PM. If you have any questions, ask your nurse or doctor.        STOP taking these medications   famotidine 20 MG tablet Commonly known as: PEPCID Stopped by: Dorita Sciara, MD     TAKE these medications   amitriptyline 25 MG tablet Commonly known as: ELAVIL Take 1 tablet (25 mg total) by mouth at bedtime.   aspirin 81 MG EC tablet Take 1 tablet (81 mg total) by mouth daily.   atorvastatin 80 MG tablet Commonly known as: LIPITOR Take 1 tablet (80 mg total) by mouth daily at 6 PM.   clopidogrel 75 MG tablet Commonly known as: PLAVIX Take 1 tablet (75 mg total) by mouth daily.   Combigan 0.2-0.5 % ophthalmic solution Generic drug: brimonidine-timolol Place 1 drop into both eyes 2 (two) times daily.   cyclobenzaprine 10 MG tablet Commonly known as: FLEXERIL TAKE 1 TABLET(10 MG) BY MOUTH TWICE  DAILY AS NEEDED FOR MUSCLE SPASMS   diclofenac sodium 1 % Gel Commonly known as: VOLTAREN Apply 2 g topically 3 (three) times daily as needed.   diphenhydrAMINE 25 MG tablet Commonly known as: BENADRYL Take 1 tablet (25 mg total) by mouth every 6 (six) hours as needed for itching or allergies.   dorzolamide 2 % ophthalmic solution Commonly known as: TRUSOPT Place 1 drop into both eyes 2 (two) times daily.   fluticasone 50 MCG/ACT nasal spray Commonly known as: Flonase Place 1 spray into both nostrils daily.   FreeStyle Libre 14 Day Reader Kerrin Mo 1 each by Does not apply route 4 (four) times daily.   FreeStyle Libre 14 Day Sensor Misc 1 each by Does not apply route QID.   furosemide 20 MG tablet Commonly known  as: LASIX Take 20 mg by mouth 2 (two) times daily.   gabapentin 300 MG capsule Commonly known as: NEURONTIN Take 1 capsule (300 mg total) by mouth 3 (three) times daily.   glucose blood test strip Commonly known as: OneTouch Verio USE TO CHECK BLOOD SUGAR 4 TIMES DAILY AS DIRECTED   Insulin Pen Needle 32G X 4 MM Misc Use to inject insulin up to 4 times daily   IRON PO Take 1 tablet by mouth 3 (three) times daily.   isosorbide mononitrate 60 MG 24 hr tablet Commonly known as: IMDUR Take 60 mg by mouth daily.   Jardiance 10 MG Tabs tablet Generic drug: empagliflozin Take 10 mg by mouth daily before breakfast. Started by: Dorita Sciara, MD   lisinopril 2.5 MG tablet Commonly known as: ZESTRIL Take 1 tablet (2.5 mg total) by mouth daily.   metFORMIN 1000 MG tablet Commonly known as: GLUCOPHAGE TAKE 1 TABLET BY MOUTH TWICE DAILY WITH A MEAL   metoprolol tartrate 50 MG tablet Commonly known as: LOPRESSOR TAKE 1 AND 1/2 TABLETS BY MOUTH TWICE DAILY   nitroGLYCERIN 0.4 MG SL tablet Commonly known as: NITROSTAT Place 1 tablet (0.4 mg total) under the tongue every 5 (five) minutes as needed for chest pain.   NovoLOG PenFill cartridge Generic drug:  insulin aspart Inject 15 Units into the skin 2 (two) times daily with a meal.   OneTouch Delica Lancets Fine Misc Check blood sugar as instructed up to 3 times a day   oxyCODONE-acetaminophen 5-325 MG tablet Commonly known as: PERCOCET/ROXICET Take 1 tablet by mouth every 8 (eight) hours as needed for severe pain.   pantoprazole 40 MG tablet Commonly known as: PROTONIX Take 1 tablet (40 mg total) by mouth daily.   ranolazine 500 MG 12 hr tablet Commonly known as: RANEXA Take 1 tablet (500 mg total) by mouth 2 (two) times daily.   Santyl ointment Generic drug: collagenase Apply 1 application topically daily as needed (foot).   Tyler Aas FlexTouch 200 UNIT/ML FlexTouch Pen Generic drug: insulin degludec INJECT 30 UNITS INTO THE SKIN DAILY   triamcinolone cream 0.1 % Commonly known as: KENALOG Apply 1 application topically as needed.   Victoza 18 MG/3ML Sopn Generic drug: liraglutide INJECT 1.2 MG TOTAL INTO THE SKIN DAILY        ALLERGIES: No Known Allergies   REVIEW OF SYSTEMS: A comprehensive ROS was conducted with the patient and is negative except as per HPI and below:  Review of Systems  Cardiovascular: Positive for leg swelling.      OBJECTIVE:   VITAL SIGNS: BP 138/72 (BP Location: Right Arm, Patient Position: Sitting, Cuff Size: Large)   Pulse 100   Temp 98.5 F (36.9 C)   Ht 5\' 6"  (1.676 m)   Wt 266 lb 3.2 oz (120.7 kg)   LMP 02/13/2011   SpO2 98%   BMI 42.97 kg/m    PHYSICAL EXAM:  General: Pt appears well and is in NAD  Neck: General: Supple without adenopathy or carotid bruits. Thyroid: Thyroid size normal.  No goiter or nodules appreciated. No thyroid bruit.  Lungs: Clear with good BS bilat with no rales, rhonchi, or wheezes  Heart: RRR with normal S1 and S2 and no gallops; no murmurs; no rub  Abdomen: Normoactive bowel sounds, soft, nontender, without masses or organomegaly palpable  Extremities:  Lower extremities - 2+ pretibial edema.    Skin: Normal texture and temperature to palpation.   Neuro: MS is good with appropriate  affect, pt is alert and Ox3    DM foot exam: 10/07/2019  The skin of the feet iswithout sores or ulcerations. The pedal pulses are undetectable The sensation is decreased  to a screening 5.07, 10 gram monofilament bilaterally   DATA REVIEWED:  Results for OLIVIYA, SHIRAR (MRN [id number]) as of 10/07/2019 14:22  Ref. Range 10/07/2019 09:15  Sodium Latest Ref Range: 135 - 145 mEq/L 137  Potassium Latest Ref Range: 3.5 - 5.1 mEq/L 3.7  Chloride Latest Ref Range: 96 - 112 mEq/L 100  CO2 Latest Ref Range: 19 - 32 mEq/L 25  Glucose Latest Ref Range: 70 - 99 mg/dL 200 (H)  BUN Latest Ref Range: 6 - 23 mg/dL 26 (H)  Creatinine Latest Ref Range: 0.40 - 1.20 mg/dL 1.12  Calcium Latest Ref Range: 8.4 - 10.5 mg/dL 9.0  GFR Latest Ref Range: >60.00 mL/min 59.89 (L)      Lab Results  Component Value Date   HGBA1C 9.6 (A) 10/07/2019   HGBA1C 10.1 (A) 02/21/2018   HGBA1C 11.5 11/10/2017   Lab Results  Component Value Date   MICROALBUR 2.35 (H) 03/08/2013   LDLCALC 48 07/17/2018   CREATININE 1.12 10/07/2019   Lab Results  Component Value Date   MICRALBCREAT 14.7 03/08/2013    Lab Results  Component Value Date   CHOL 116 07/17/2018   HDL 51 07/17/2018   LDLCALC 48 07/17/2018   TRIG 83 07/17/2018   CHOLHDL 2.3 07/17/2018        05/30/2019 Bun/Cr 15/1.06  GFR > 60 A1c 12.0% ASSESSMENT / PLAN / RECOMMENDATIONS:   1) Type 2 Diabetes Mellitus, Poorly controlled, With retinopathic ,  neuropathic, and macrovascular  complications - Most recent A1c of 9.6 %. Goal A1c < 7.0 %.    Plan: GENERAL:  In review of her CGM download, pt has fluctuating hyperglycemia and hypoglycemia, this is partly due to inconsistent insulin intake . Pt states if bedtime BG is < 180 mg/dL , she would skip taking tresiba due to fear of  hypoglycemia.   Pt also noted to have hypoglycemic episodes after a bolus, but  she tends to bolus post-prandial when her BG is already > 200 mg./dL Discussed pharmacokinetics of basal/bolus insulin and the importance of taking prandial insulin with meals.   We also discussed avoiding sugar-sweetened beverages and snacks, when possible.   Given her CHF and cardiovascular history, she is a great candidate for SGLt-2 inhibitors. She was cautioned again genital infections and dehydration with this. Pt in agreement to start this  MEDICATIONS:  -Decrease Tresiba to 24 units daily  - Decrease Novolog to 12 units with each meal  - Start Jardiance 10 mg daily with First meal of the day  - Continue Metformin 1000 mg twice daily  - Continue Victoza 1.8 mg daily    EDUCATION / INSTRUCTIONS:  BG monitoring instructions: Patient is instructed to check her blood sugars 3 times a day, before meals .  Call [facility] if: BG persistently < 70 or > 300. . I reviewed the Rule of 15 for the treatment of hypoglycemia in detail with the patient. Literature supplied.   2) Diabetic complications:   Eye: Does  have known diabetic retinopathy.   Neuro/ Feet: Does have known diabetic peripheral neuropathy.  Renal: Patient does not have known baseline CKD. She is on an ACEI/ARB at present.  3) Lipids: Patient is on Atorvastatin 80 mg daily. LDL at goal   F/U in 3 months  Signed electronically by: Mack Guise, MD  [name] Endocrinology  [facility] [address], [place], [place] [address] Phone: [phone] FAX: [phone]   CC: Rocco Serene, [place] [address] Phone: [phone]  Fax: [phone]    Return to Endocrinology clinic as below: Future Appointments  Date Time Provider [place]  01/13/2020 10:50 AM , Melanie Crazier, MD [id number] None

## 2019-10-07 NOTE — Patient Instructions (Addendum)
-   Decrease Tresiba to 24 units daily  - Decrease Novolog to 12 units with each meal  - Start Jardiance 10 mg daily with First meal of the day  - Continue Metformin 1000 mg twice daily  - Continue Victoza 1.8 mg daily    -Check sugar before meals    HOW TO TREAT LOW BLOOD SUGARS (Blood sugar LESS THAN 70 MG/DL)  Please follow the RULE OF 15 for the treatment of hypoglycemia treatment (when your (blood sugars are less than 70 mg/dL)    STEP 1: Take 15 grams of carbohydrates when your blood sugar is low, which includes:   3-4 GLUCOSE TABS  OR  3-4 OZ OF JUICE OR REGULAR SODA OR  ONE TUBE OF GLUCOSE GEL     STEP 2: RECHECK blood sugar in 15 MINUTES STEP 3: If your blood sugar is still low at the 15 minute recheck --> then, go back to STEP 1 and treat AGAIN with another 15 grams of carbohydrates.

## 2019-11-05 ENCOUNTER — Other Ambulatory Visit: Payer: Self-pay

## 2019-11-05 MED ORDER — METOPROLOL TARTRATE 50 MG PO TABS: 75.0000 mg | ORAL_TABLET | Freq: Two times a day (BID) | ORAL | 2 refills | Status: DC

## 2019-11-05 MED ORDER — LISINOPRIL 2.5 MG PO TABS: 2.5000 mg | ORAL_TABLET | Freq: Every day | ORAL | 2 refills | Status: DC

## 2019-11-05 MED ORDER — ISOSORBIDE MONONITRATE ER 60 MG PO TB24: 60.0000 mg | ORAL_TABLET | Freq: Every day | ORAL | 2 refills | Status: DC

## 2019-11-06 ENCOUNTER — Other Ambulatory Visit: Payer: Self-pay

## 2019-11-06 MED ORDER — NITROGLYCERIN 0.4 MG SL SUBL: 0.4000 mg | SUBLINGUAL_TABLET | SUBLINGUAL | 2 refills | Status: DC | PRN

## 2019-11-08 ENCOUNTER — Other Ambulatory Visit: Payer: Self-pay

## 2019-11-08 MED ORDER — JARDIANCE 10 MG PO TABS: 10.0000 mg | ORAL_TABLET | Freq: Every day | ORAL | 1 refills | Status: DC

## 2019-11-11 NOTE — Progress Notes (Signed)
Cardiology Office Note:    Date:  11/12/2019   ID:  [name], DOB 04/20/1961, MRN [id number]  PCP:  Sonia Side., FNP  Cardiologist:  Sherren Mocha, MD   Electrophysiologist:  None   Referring MD: Rocco Serene, MD   Chief Complaint:  Follow-up (CAD, CHF)    Patient Profile:    [name] is a 59 y.o. female with:   Coronary artery disease  ? NSTEMI 04/2018 >> Cath w/ diffuse disease >> no targets for CABG >> Med Rx  Cath: pLAD 50, mLAD 80, dLAD 70; oD1 95, mLCx 90, dLCx 80, OM2 95, mRCA 30, dRCA 30  PCI could be considered if medical Rx failed  Chronic Systolic CHF ? Echocardiogram 7/13: EF 35-40 ? Echocardiogram 11/16: EF 55-60 ? Echocardiogram 8/18: EF 50-55 ? Echocardiogram 11/19: EF 45-50 ? Non-ischemic cardiomyopathy (CM out of proportion to extensive CAD)   Morbid obesity   Diabetes mellitus   Hypertension   Hyperlipidemia   Lung nodule  Prior CV studies: Echo 05/23/18 Mild conc LVH, EF 45-50, diff HK, normal RVSF  Cardiac Catheterization11/26/19 LAD prox 50, mid 80, dist 70; D1 ost 95 LCx mid 90, dist 80; OM2 95 RCA mid 30, dist 30 EF 25-35, diff HK involving mid distal anterolateral wall and apex extending to mid distal portion of inf wall  Echo 02/07/17 EF 50-55, no RWMA, Gr 1 DD, normal RVSF  Echo 05/01/15 Mild LVH, EF 55-60, no RWMA, Gr 1 DD  Echocardiogram 7/13:  limited study, EF 35-40%.   LHC 7/13:  Mid LAD 50% within a myocardial bridge, EF 40-45%.  History of Present Illness:    Ms. Sawada was last seen in clinic in 07/2019.  She was having more anginal symptoms and I put her on Ranolazine.  I reviewed her case with Dr. Burt Knack who noted that PCI could be considered if she continues to have angina despite OMT. She returns for follow up.  She is here alone.  Since starting on ranolazine, she has not had any further chest pain.  She has not had significant shortness of breath.  She has not had orthopnea, lower  extremity swelling or syncope.  Past Medical History:  Diagnosis Date  . Adhesive capsulitis of shoulder    bilateral, Steroid injection Dr. Truman Hayward 1/12 bilaterally  . CAD (coronary artery disease)    nonobstructive. Last cardiac cath (2008) showing left circumflex with mid 50% stenosis and distal luminal irregularities. Also with RCA with mid to distal 30-40% stenosis. // Previously evaluated by [facility], never followed up outpatient.  Marland Kitchen CAP (community acquired pneumonia) 06/15/2012   05/2012 CXR: Mild opacification of the posterior lung base on the lateral film  as cannot exclude infection/atelectasis.    . CHF (congestive heart failure) ([place])   . Diabetes mellitus 2007   Type II, insulin dependent  . Diabetic retinopathy   . GERD (gastroesophageal reflux disease)   . Glaucoma   . Hyperlipidemia   . Hypertension   . Obesity   . Peripheral neuropathy     Current Medications: Current Meds  Medication Sig  . amitriptyline (ELAVIL) 25 MG tablet Take 1 tablet (25 mg total) by mouth at bedtime.  Marland Kitchen aspirin EC 81 MG EC tablet Take 1 tablet (81 mg total) by mouth daily.  Marland Kitchen atorvastatin (LIPITOR) 80 MG tablet Take 1 tablet (80 mg total) by mouth daily at 6 PM.  . clopidogrel (PLAVIX) 75 MG tablet Take 1 tablet (75 mg  total) by mouth daily.  . COMBIGAN 0.2-0.5 % ophthalmic solution Place 1 drop into both eyes 2 (two) times daily.  . Continuous Blood Gluc Receiver (FREESTYLE LIBRE 14 DAY READER) DEVI 1 each by Does not apply route 4 (four) times daily.  . Continuous Blood Gluc Sensor (FREESTYLE LIBRE 14 DAY SENSOR) MISC 1 each by Does not apply route QID.  Marland Kitchen cyclobenzaprine (FLEXERIL) 10 MG tablet TAKE 1 TABLET(10 MG) BY MOUTH TWICE DAILY AS NEEDED FOR MUSCLE SPASMS  . diclofenac sodium (VOLTAREN) 1 % GEL Apply 2 g topically 3 (three) times daily as needed.  . diphenhydrAMINE (BENADRYL) 25 MG tablet Take 1 tablet (25 mg total) by mouth every 6 (six) hours as needed for itching or  allergies.  Marland Kitchen dorzolamide (TRUSOPT) 2 % ophthalmic solution Place 1 drop into both eyes 2 (two) times daily.  . empagliflozin (JARDIANCE) 10 MG TABS tablet Take 10 mg by mouth daily before breakfast.  . fluticasone (FLONASE) 50 MCG/ACT nasal spray Place 1 spray into both nostrils daily.  . furosemide (LASIX) 20 MG tablet Take 20 mg by mouth 2 (two) times daily.  Marland Kitchen gabapentin (NEURONTIN) 300 MG capsule Take 300 mg by mouth 2 (two) times daily.  Marland Kitchen glucose blood (ONETOUCH VERIO) test strip USE TO CHECK BLOOD SUGAR 4 TIMES DAILY AS DIRECTED  . insulin aspart (NOVOLOG PENFILL) cartridge Inject 4 Units into the skin 2 (two) times daily with a meal.   . insulin degludec (TRESIBA FLEXTOUCH) 200 UNIT/ML FlexTouch Pen Inject 24 Units into the skin.  . Insulin Pen Needle 32G X 4 MM MISC Use to inject insulin up to 4 times daily  . IRON PO Take 1 tablet by mouth 3 (three) times daily.  . isosorbide mononitrate (IMDUR) 60 MG 24 hr tablet Take 1 tablet (60 mg total) by mouth daily.  Marland Kitchen lisinopril (ZESTRIL) 2.5 MG tablet Take 1 tablet (2.5 mg total) by mouth daily.  . metFORMIN (GLUCOPHAGE) 1000 MG tablet TAKE 1 TABLET BY MOUTH TWICE DAILY WITH A MEAL  . metoprolol tartrate (LOPRESSOR) 50 MG tablet Take 1.5 tablets (75 mg total) by mouth 2 (two) times daily.  . nitroGLYCERIN (NITROSTAT) 0.4 MG SL tablet Place 1 tablet (0.4 mg total) under the tongue every 5 (five) minutes as needed for chest pain.  Glory Rosebush DELICA LANCETS FINE MISC Check blood sugar as instructed up to 3 times a day  . oxyCODONE-acetaminophen (PERCOCET/ROXICET) 5-325 MG tablet Take 1 tablet by mouth every 8 (eight) hours as needed for severe pain.  . pantoprazole (PROTONIX) 40 MG tablet Take 1 tablet (40 mg total) by mouth daily.  . ranolazine (RANEXA) 500 MG 12 hr tablet Take 1 tablet (500 mg total) by mouth 2 (two) times daily.  Marland Kitchen SANTYL ointment Apply 1 application topically daily as needed (foot).   . triamcinolone cream (KENALOG) 0.1 %  Apply 1 application topically as needed.  Marland Kitchen VICTOZA 18 MG/3ML SOPN INJECT 1.2 MG TOTAL INTO THE SKIN DAILY     Allergies:   Patient has no known allergies.   Social History   Tobacco Use  . Smoking status: Former Smoker    Packs/day: 0.30    Years: 7.00    Pack years: 2.10    Types: Cigarettes    Quit date: 06/27/2006    Years since quitting: 13.3  . Smokeless tobacco: Never Used  Substance Use Topics  . Alcohol use: No    Alcohol/week: 0.0 standard drinks  . Drug use: No  Family Hx: The patient's family history includes Heart disease in her mother; Hypertension in her sister, sister, and sister. There is no history of Heart attack or Stroke.  ROS   EKGs/Labs/Other Test Reviewed:    EKG:  EKG is  ordered today.  The ekg ordered today demonstrates normal sinus rhythm, heart rate 97, normal axis, nonspecific ST-T wave changes, QTC 464  Recent Labs: 05/30/2019: ALT 16; Hemoglobin 12.0; Platelets 322 10/07/2019: BUN 26; Creatinine, Ser 1.12; Potassium 3.7; Sodium 137   Recent Lipid Panel Lab Results  Component Value Date/Time   CHOL 116 07/17/2018 11:05 AM   TRIG 83 07/17/2018 11:05 AM   HDL 51 07/17/2018 11:05 AM   CHOLHDL 2.3 07/17/2018 11:05 AM   CHOLHDL 4.1 05/21/2018 06:00 PM   LDLCALC 48 07/17/2018 11:05 AM    Physical Exam:    VS:  BP (!) 122/50   Pulse (!) 110   Ht 5\' 6"  (1.676 m)   Wt 256 lb (116.1 kg)   LMP 02/13/2011   SpO2 93%   BMI 41.32 kg/m     Wt Readings from Last 3 Encounters:  11/12/19 256 lb (116.1 kg)  10/07/19 266 lb 3.2 oz (120.7 kg)  07/30/19 254 lb 9.6 oz (115.5 kg)     Constitutional:      Appearance: Healthy appearance. Not in distress.  Neck:     Thyroid: No thyromegaly.     Vascular: JVD normal.  Pulmonary:     Effort: Pulmonary effort is normal.     Breath sounds: No wheezing. No rales.  Cardiovascular:     Normal rate. Regular rhythm. Normal S1. Normal S2.     Murmurs: There is no murmur.  Edema:    Pretibial:  bilateral trace edema of the pretibial area. Abdominal:     Palpations: Abdomen is soft. There is no hepatomegaly.  Skin:    General: Skin is warm and dry.  Neurological:     Mental Status: Alert and oriented to person, place and time.     Cranial Nerves: Cranial nerves are intact.      ASSESSMENT & PLAN:    1. Coronary artery disease involving native coronary artery of native heart with angina pectoris [facility]) S/p NSTEMIin November 2019.Cardiac catheterization demonstrated diffuse small vessel and distal vessel disease. The culprit was felt to be the distal OM1 or the first diagonal. Both vessels are small in caliber and are not likely amenable to PCI. Her anatomy is also not amenable to CABG. She has been managed medically.  At last visit, she was having more anginal symptoms.  I placed her on ranolazine.  She is doing much better since starting ranolazine without recurrent chest discomfort.  EKG today demonstrates stable QTC.  Continue current dose of aspirin, atorvastatin, clopidogrel, isosorbide, ranolazine, metoprolol tartrate.  Follow-up in 4 months.  2. Chronic systolic CHF (congestive heart failure) (HCC) EF 45-50.  NYHA IIb.  Nonischemic cardiomyopathy.  Volume status appears stable.  Continue current dose of furosemide, isosorbide, lisinopril, metoprolol tartrate.  Blood pressure has limited further titration of CHF medications.  3. Essential hypertension The patient's blood pressure is controlled on her current regimen.  Continue current therapy.     Dispo:  Return in about 4 months (around 03/14/2020) for Routine Follow Up, w/ Dr. Burt Knack, or Richardson Dopp, PA-C, in person.   Medication Adjustments/Labs and Tests Ordered: Current medicines are reviewed at length with the patient today.  Concerns regarding medicines are outlined above.  Tests Ordered: Orders Placed  This Encounter  Procedures  . EKG 12-Lead   Medication Changes: No orders of the defined types were placed  in this encounter.   Signed, Richardson Dopp, PA-C  11/12/2019 6:02 PM    [facility] [place], [place], [place]  [address] Phone: [phone]; Fax: [phone]

## 2019-11-12 ENCOUNTER — Ambulatory Visit (INDEPENDENT_AMBULATORY_CARE_PROVIDER_SITE_OTHER): Payer: Medicare HMO | Admitting: Physician Assistant

## 2019-11-12 ENCOUNTER — Encounter: Payer: Self-pay | Admitting: Physician Assistant

## 2019-11-12 ENCOUNTER — Other Ambulatory Visit: Payer: Self-pay

## 2019-11-12 VITALS — BP 122/50 | HR 110 | Ht 66.0 in | Wt 256.0 lb

## 2019-11-12 DIAGNOSIS — I10 Essential (primary) hypertension: Secondary | ICD-10-CM | POA: Diagnosis not present

## 2019-11-12 DIAGNOSIS — I5022 Chronic systolic (congestive) heart failure: Secondary | ICD-10-CM

## 2019-11-12 DIAGNOSIS — I25119 Atherosclerotic heart disease of native coronary artery with unspecified angina pectoris: Secondary | ICD-10-CM

## 2019-11-12 NOTE — Patient Instructions (Addendum)
Medication Instructions:   Your physician recommends that you continue on your current medications as directed. Please refer to the Current Medication list given to you today.  *If you need a refill on your cardiac medications before your next appointment, please call your pharmacy*  Lab Work:  None ordered today  Testing/Procedures:  None ordered today  Follow-Up:  On 03/17/20 at 12:15PM with Richardson Dopp, PA-C

## 2019-12-26 ENCOUNTER — Ambulatory Visit: Payer: Medicare HMO

## 2019-12-26 ENCOUNTER — Other Ambulatory Visit: Payer: Self-pay

## 2019-12-26 ENCOUNTER — Encounter: Payer: Self-pay | Admitting: Podiatry

## 2019-12-26 ENCOUNTER — Ambulatory Visit (INDEPENDENT_AMBULATORY_CARE_PROVIDER_SITE_OTHER): Payer: Medicare HMO | Admitting: Podiatry

## 2019-12-26 DIAGNOSIS — B351 Tinea unguium: Secondary | ICD-10-CM | POA: Diagnosis not present

## 2019-12-26 DIAGNOSIS — L97509 Non-pressure chronic ulcer of other part of unspecified foot with unspecified severity: Secondary | ICD-10-CM

## 2019-12-26 DIAGNOSIS — IMO0002 Reserved for concepts with insufficient information to code with codable children: Secondary | ICD-10-CM

## 2019-12-26 DIAGNOSIS — M79674 Pain in right toe(s): Secondary | ICD-10-CM

## 2019-12-26 DIAGNOSIS — I739 Peripheral vascular disease, unspecified: Secondary | ICD-10-CM

## 2019-12-26 DIAGNOSIS — E1165 Type 2 diabetes mellitus with hyperglycemia: Secondary | ICD-10-CM

## 2019-12-26 DIAGNOSIS — L84 Corns and callosities: Secondary | ICD-10-CM | POA: Diagnosis not present

## 2019-12-26 DIAGNOSIS — E1140 Type 2 diabetes mellitus with diabetic neuropathy, unspecified: Secondary | ICD-10-CM

## 2019-12-26 DIAGNOSIS — M79675 Pain in left toe(s): Secondary | ICD-10-CM

## 2019-12-27 ENCOUNTER — Telehealth: Payer: Self-pay | Admitting: *Deleted

## 2019-12-27 DIAGNOSIS — L97509 Non-pressure chronic ulcer of other part of unspecified foot with unspecified severity: Secondary | ICD-10-CM

## 2019-12-27 DIAGNOSIS — E1165 Type 2 diabetes mellitus with hyperglycemia: Secondary | ICD-10-CM

## 2019-12-27 DIAGNOSIS — E1140 Type 2 diabetes mellitus with diabetic neuropathy, unspecified: Secondary | ICD-10-CM

## 2019-12-27 DIAGNOSIS — IMO0002 Reserved for concepts with insufficient information to code with codable children: Secondary | ICD-10-CM

## 2019-12-27 DIAGNOSIS — I739 Peripheral vascular disease, unspecified: Secondary | ICD-10-CM

## 2019-12-27 NOTE — Telephone Encounter (Signed)
-----   Message from Trula Slade, DPM sent at 12/27/2019  7:56 AM EDT ----- Can you please order arterial duplex?  I did an ABI in the office which is normal but given color change to the toes I would like to get formal studies.  Thank you

## 2019-12-27 NOTE — Progress Notes (Signed)
Subjective:   Patient ID: [name], female   DOB: 59 y.o.   MRN: [id number]   HPI 59 year old female presents the office with concerns of pain to the back of her right heel over the last couple of weeks.  She denies any recent injury or trauma to the heel.  She is notes a dark spot on the heel.  She also has calluses on her toes that need to be trimmed as well as the nails on the fourth and fifth toes.  She previously has seen Dr. Babs Bertin and has had multiple nail removed.  She currently denies any open sores or injuries that she can recall.  She is diabetic and last A1c is 9.3 which she states has improved.  She denies any claudication symptoms.  She has no other concerns today.   Review of Systems  All other systems reviewed and are negative.  Past Medical History:  Diagnosis Date  . Adhesive capsulitis of shoulder    bilateral, Steroid injection Dr. Truman Hayward 1/12 bilaterally  . CAD (coronary artery disease)    nonobstructive. Last cardiac cath (2008) showing left circumflex with mid 50% stenosis and distal luminal irregularities. Also with RCA with mid to distal 30-40% stenosis. // Previously evaluated by [facility], never followed up outpatient.  Marland Kitchen CAP (community acquired pneumonia) 06/15/2012   05/2012 CXR: Mild opacification of the posterior lung base on the lateral film  as cannot exclude infection/atelectasis.    . CHF (congestive heart failure) ([place])   . Diabetes mellitus 2007   Type II, insulin dependent  . Diabetic retinopathy   . GERD (gastroesophageal reflux disease)   . Glaucoma   . Hyperlipidemia   . Hypertension   . Obesity   . Peripheral neuropathy     Past Surgical History:  Procedure Laterality Date  . ANTERIOR CERVICAL DECOMP/DISCECTOMY FUSION N/A 06/16/2016   Procedure: Anterior Cervical Decompression/discectomy Fusion - Cervical six - Cervical seven;  Surgeon: Eustace Moore, MD;  Location: [facility];  Service: Neurosurgery;  Laterality: N/A;  Anterior  Cervical Decompression/discectomy Fusion - Cervical six - Cervical seven  . APPENDECTOMY    . CARDIAC CATHETERIZATION    . CATARACT EXTRACTION    . GLAUCOMA SURGERY    . LEFT HEART CATH AND CORONARY ANGIOGRAPHY N/A 05/22/2018   Procedure: LEFT HEART CATH AND CORONARY ANGIOGRAPHY;  Surgeon: Troy Sine, MD;  Location: [facility];  Service: Cardiovascular;  Laterality: N/A;  . LEFT HEART CATHETERIZATION WITH CORONARY ANGIOGRAM N/A 01/24/2012   Procedure: LEFT HEART CATHETERIZATION WITH CORONARY ANGIOGRAM;  Surgeon: Sherren Mocha, MD;  Location: [facility] CATH [facility];  Service: Cardiovascular;  Laterality: N/A;  . POSTERIOR CERVICAL FUSION/FORAMINOTOMY N/A 10/22/2015   Procedure: Cervical three-Cervical Seven Posterior cervical fusion with lateral mass fixation,  Laminectomy Cervical Three-Cervical Seven;  Surgeon: Eustace Moore, MD;  Location: [facility];  Service: Neurosurgery;  Laterality: N/A;  Posterior  . TUBAL LIGATION       Current Outpatient Medications:  .  amitriptyline (ELAVIL) 25 MG tablet, Take 1 tablet (25 mg total) by mouth at bedtime., Disp: 90 tablet, Rfl: 1 .  aspirin EC 81 MG EC tablet, Take 1 tablet (81 mg total) by mouth daily., Disp: , Rfl:  .  atorvastatin (LIPITOR) 80 MG tablet, Take 1 tablet (80 mg total) by mouth daily at 6 PM., Disp: 30 tablet, Rfl: 11 .  clopidogrel (PLAVIX) 75 MG tablet, Take 1 tablet (75 mg total) by mouth daily., Disp: 30 tablet,  Rfl: 11 .  COMBIGAN 0.2-0.5 % ophthalmic solution, Place 1 drop into both eyes 2 (two) times daily., Disp: , Rfl: 3 .  Continuous Blood Gluc Receiver (FREESTYLE LIBRE 14 DAY READER) DEVI, 1 each by Does not apply route 4 (four) times daily., Disp: 1 Device, Rfl: 0 .  Continuous Blood Gluc Sensor (FREESTYLE LIBRE 14 DAY SENSOR) MISC, 1 each by Does not apply route QID., Disp: 2 each, Rfl: 12 .  cyclobenzaprine (FLEXERIL) 10 MG tablet, TAKE 1 TABLET(10 MG) BY MOUTH TWICE DAILY AS NEEDED FOR MUSCLE SPASMS, Disp: 60 tablet,  Rfl: 0 .  diclofenac sodium (VOLTAREN) 1 % GEL, Apply 2 g topically 3 (three) times daily as needed., Disp: 1 Tube, Rfl: 0 .  diphenhydrAMINE (BENADRYL) 25 MG tablet, Take 1 tablet (25 mg total) by mouth every 6 (six) hours as needed for itching or allergies., Disp: 20 tablet, Rfl: 0 .  dorzolamide (TRUSOPT) 2 % ophthalmic solution, Place 1 drop into both eyes 2 (two) times daily., Disp: , Rfl: 11 .  empagliflozin (JARDIANCE) 10 MG TABS tablet, Take 10 mg by mouth daily before breakfast., Disp: 90 tablet, Rfl: 1 .  fluticasone (FLONASE) 50 MCG/ACT nasal spray, Place 1 spray into both nostrils daily., Disp: 16 g, Rfl: 2 .  furosemide (LASIX) 20 MG tablet, Take 20 mg by mouth 2 (two) times daily., Disp: , Rfl:  .  gabapentin (NEURONTIN) 300 MG capsule, Take 300 mg by mouth 2 (two) times daily., Disp: , Rfl:  .  glucose blood (ONETOUCH VERIO) test strip, USE TO CHECK BLOOD SUGAR 4 TIMES DAILY AS DIRECTED, Disp: 300 each, Rfl: 1 .  insulin aspart (NOVOLOG PENFILL) cartridge, Inject 4 Units into the skin 2 (two) times daily with a meal. , Disp: , Rfl:  .  insulin degludec (TRESIBA FLEXTOUCH) 200 UNIT/ML FlexTouch Pen, Inject 24 Units into the skin., Disp: , Rfl:  .  Insulin Pen Needle 32G X 4 MM MISC, Use to inject insulin up to 4 times daily, Disp: 400 each, Rfl: 1 .  IRON PO, Take 1 tablet by mouth 3 (three) times daily., Disp: , Rfl:  .  isosorbide mononitrate (IMDUR) 60 MG 24 hr tablet, Take 1 tablet (60 mg total) by mouth daily., Disp: 90 tablet, Rfl: 2 .  lisinopril (ZESTRIL) 2.5 MG tablet, Take 1 tablet (2.5 mg total) by mouth daily., Disp: 90 tablet, Rfl: 2 .  metFORMIN (GLUCOPHAGE) 1000 MG tablet, TAKE 1 TABLET BY MOUTH TWICE DAILY WITH A MEAL, Disp: 180 tablet, Rfl: 0 .  metoprolol tartrate (LOPRESSOR) 50 MG tablet, Take 1.5 tablets (75 mg total) by mouth 2 (two) times daily., Disp: 270 tablet, Rfl: 2 .  nitroGLYCERIN (NITROSTAT) 0.4 MG SL tablet, Place 1 tablet (0.4 mg total) under the tongue  every 5 (five) minutes as needed for chest pain., Disp: 75 tablet, Rfl: 2 .  ONETOUCH DELICA LANCETS FINE MISC, Check blood sugar as instructed up to 3 times a day, Disp: 100 each, Rfl: 12 .  oxyCODONE-acetaminophen (PERCOCET/ROXICET) 5-325 MG tablet, Take 1 tablet by mouth every 8 (eight) hours as needed for severe pain., Disp: 90 tablet, Rfl: 0 .  pantoprazole (PROTONIX) 40 MG tablet, Take 1 tablet (40 mg total) by mouth daily., Disp: 90 tablet, Rfl: 0 .  ranolazine (RANEXA) 500 MG 12 hr tablet, Take 1 tablet (500 mg total) by mouth 2 (two) times daily., Disp: 60 tablet, Rfl: 11 .  SANTYL ointment, Apply 1 application topically daily as needed (foot). ,  Disp: , Rfl:  .  triamcinolone cream (KENALOG) 0.1 %, Apply 1 application topically as needed., Disp: , Rfl:  .  VICTOZA 18 MG/3ML SOPN, INJECT 1.2 MG TOTAL INTO THE SKIN DAILY, Disp: 18 mL, Rfl: 0  No Known Allergies        Objective:  Physical Exam  General: AAO x3, NAD  Dermatological: On the right posterior lateral heel there is a hyperpigmented area with callus formation.  Upon debridement there was callus tissue present but there was healthy skin present underneath this.  The distal aspect of the right hallux as well as left second toes hyperkeratotic tissue with superficial areas of skin breakdown.  There is no drainage or pus or any fluctuation crepitation.  Bilateral fourth and fifth toenails are hypertrophic, dystrophic with brown, dark discoloration.          Vascular: Dorsalis Pedis artery and Posterior Tibial artery pedal pulses are palpable bilateral with immedate capillary fill time. Pedal hair growth present.   Neruologic: Sensation decreased with Thornell Mule monofilament  Musculoskeletal: Hammertoe contractures present.  Muscular strength 5/5 in all groups tested bilateral.     Assessment:   59 year old female right posterior heel preulcerative callus; superficial wounds; symptomatic onychomycosis;  uncontrolled diabetes    Plan:  -Treatment options discussed including all alternatives, risks, and complications -Etiology of symptoms were discussed -I debrided the thick hyperkeratotic tissues in the right hallux and left second toe tibial superficial areas of skin breakdown.  Antibiotic ointment and changes daily. -Debrided nails on the left fourth and fifth toes x4 without any complications or bleeding. -Debrided the callus on the right posterior heel without any complications. -Miracle foot cream dispensed for the heel -Offloading -ABI was done in the office which was normal.  Left was 1.03 right was 1.02.  However given the color changes to the foot I would order formal arterial studies. -Consider biopsy for the right heel if no improvement.  Trula Slade DPM

## 2019-12-27 NOTE — Telephone Encounter (Signed)
[place], "THIS [place] MEDICAID MEMBER DOES NOT REQUIRE PRIOR AUTHORIZATION FOR OUTPATIENT RADIOLOGY THROUGH [facility] AT THIS TIME." Faxed orders to [facility].

## 2019-12-28 IMAGING — DX DG CHEST 2V
2 series · 2 of 2 positions shown · non-contrast
Comparison: 09/23/2016

CLINICAL DATA: Cough for weeks, congestion, chest pain, possible
pneumonia, history diabetes mellitus, hypertension, coronary artery
disease, GERD, CHF

EXAM:
CHEST - 2 VIEW

[chest pa]
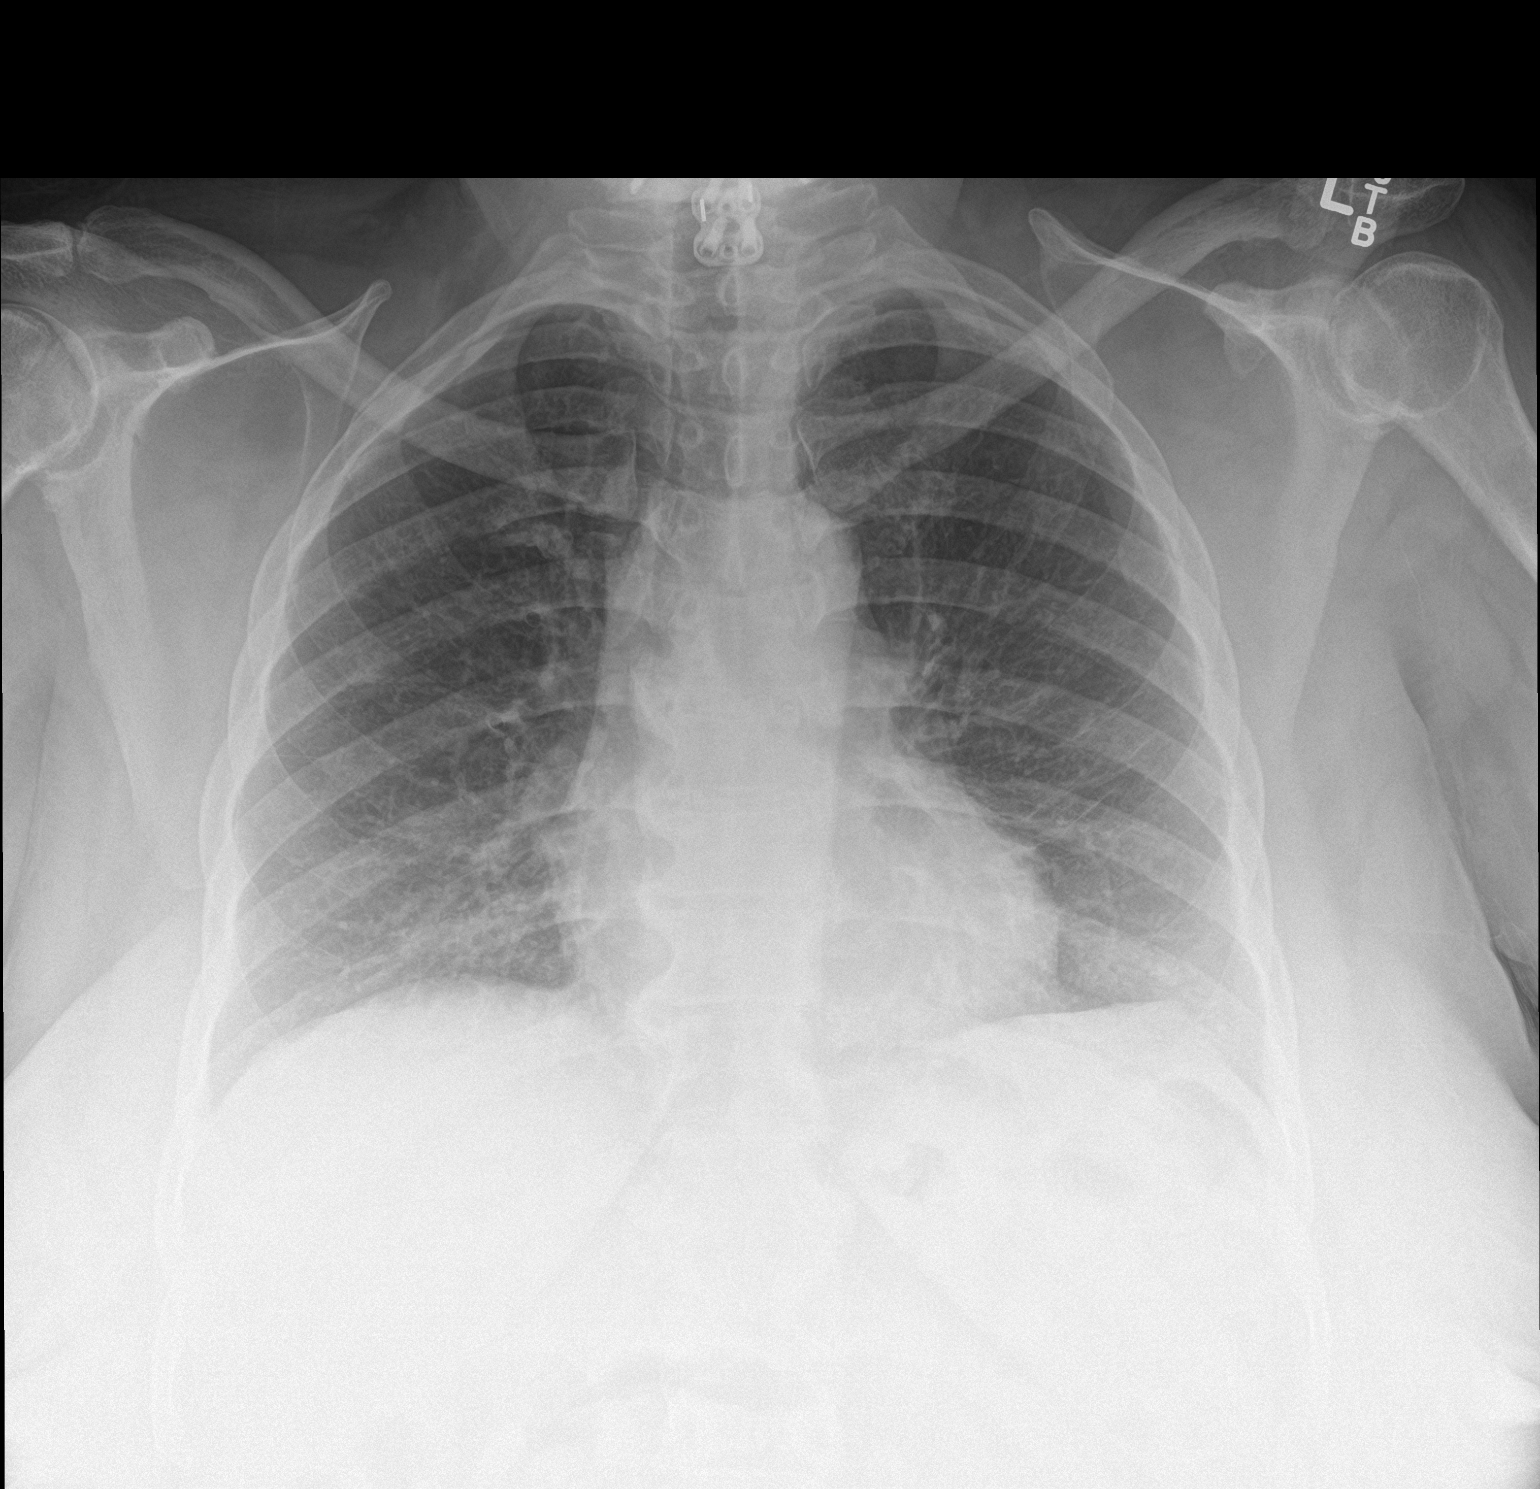

[chest lat]
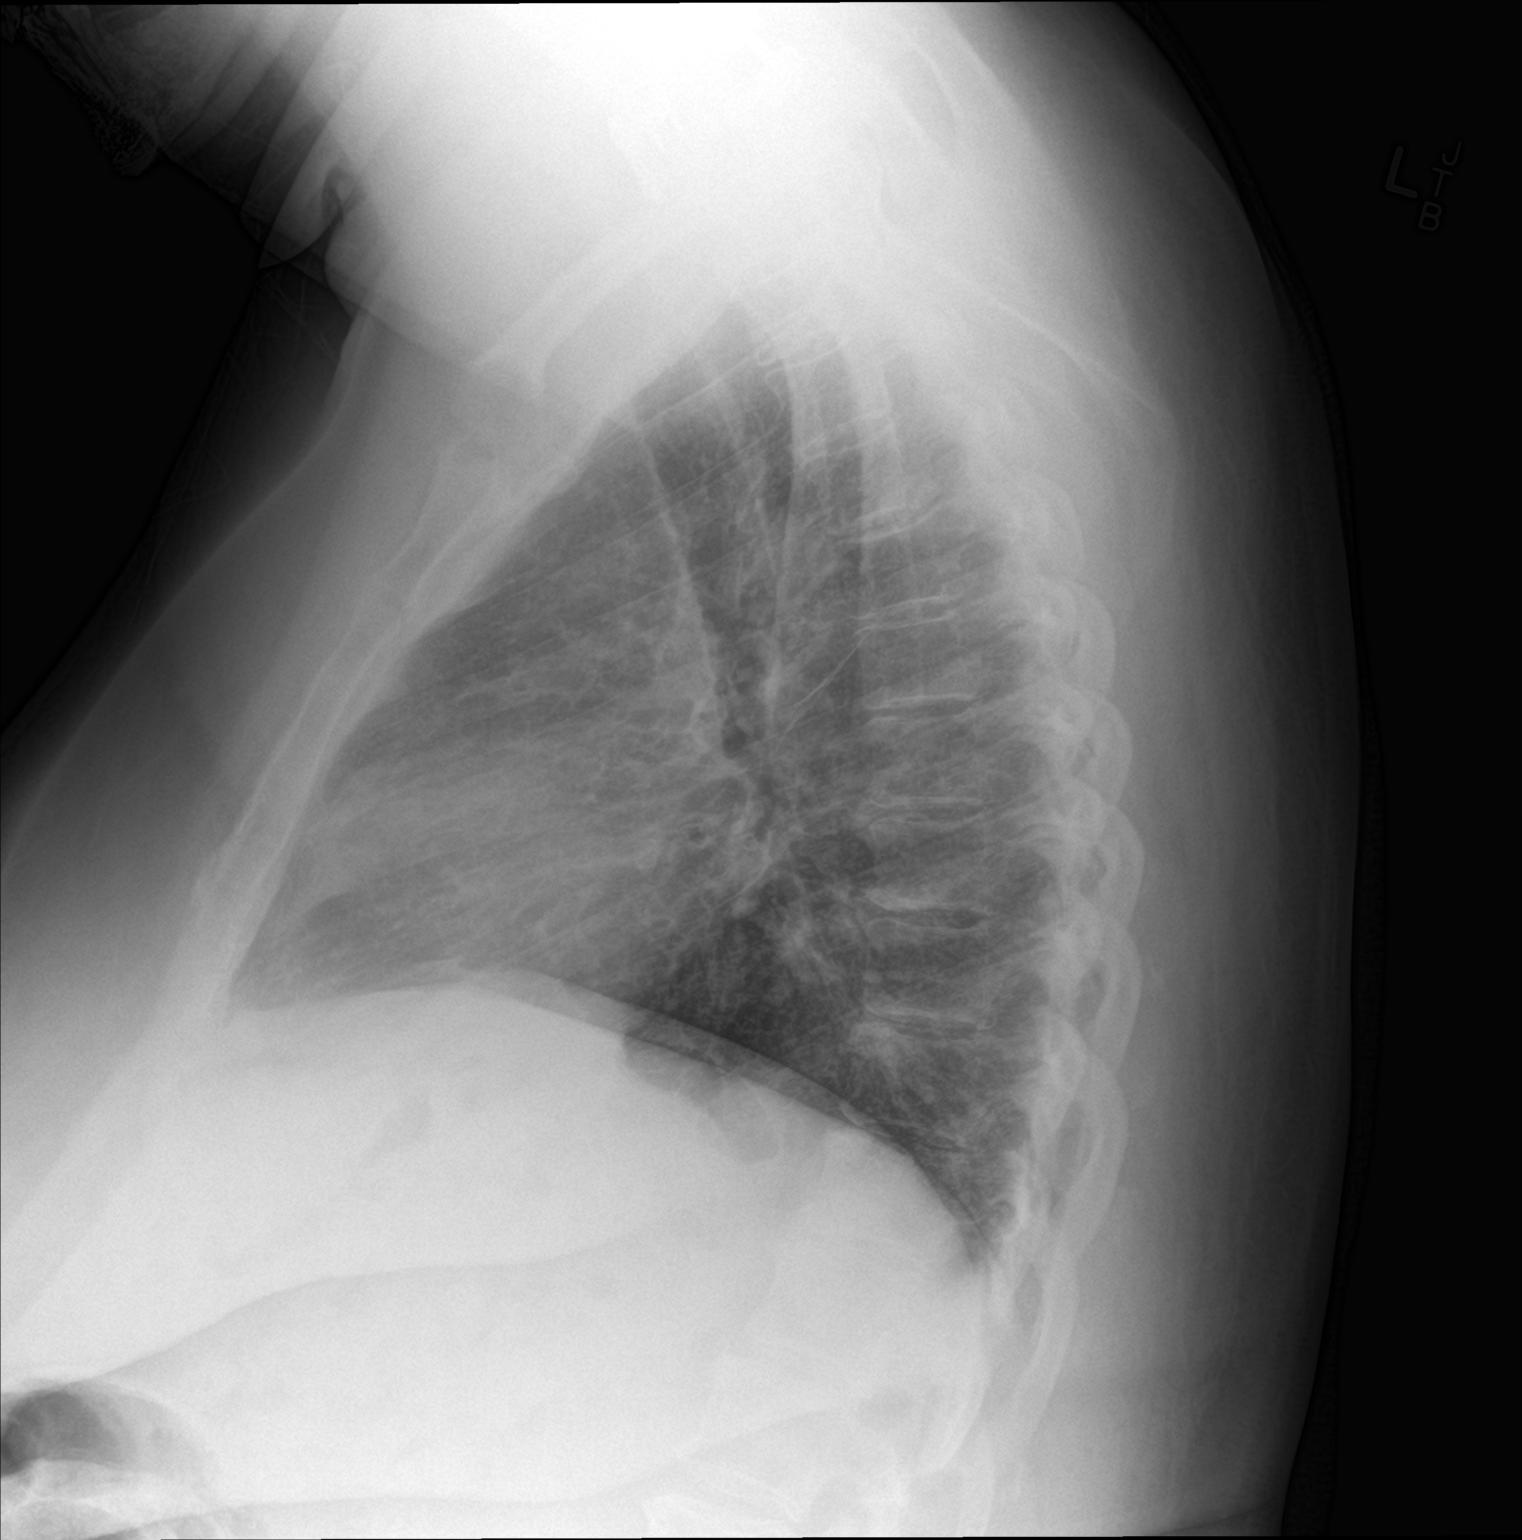

[2 of 2 positions shown; findings below may reference images not displayed]

FINDINGS: Normal heart size, mediastinal contours, and pulmonary vascularity.

Scarring LEFT mid lung.

Lungs otherwise clear.

Minimal chronic peribronchial thickening.

No acute infiltrate, pleural effusion, or pneumothorax.

Prior cervical spine fusion.
IMPRESSION: Bronchitic changes with scarring LEFT mid lung.

No acute abnormalities.

## 2020-01-13 ENCOUNTER — Ambulatory Visit (INDEPENDENT_AMBULATORY_CARE_PROVIDER_SITE_OTHER): Payer: Medicare HMO | Admitting: Internal Medicine

## 2020-01-13 ENCOUNTER — Other Ambulatory Visit: Payer: Self-pay

## 2020-01-13 ENCOUNTER — Encounter: Payer: Self-pay | Admitting: Internal Medicine

## 2020-01-13 VITALS — BP 128/68 | HR 65 | Ht 66.0 in | Wt 262.8 lb

## 2020-01-13 DIAGNOSIS — E11319 Type 2 diabetes mellitus with unspecified diabetic retinopathy without macular edema: Secondary | ICD-10-CM | POA: Diagnosis not present

## 2020-01-13 DIAGNOSIS — E1142 Type 2 diabetes mellitus with diabetic polyneuropathy: Secondary | ICD-10-CM

## 2020-01-13 DIAGNOSIS — IMO0002 Reserved for concepts with insufficient information to code with codable children: Secondary | ICD-10-CM

## 2020-01-13 DIAGNOSIS — Z794 Long term (current) use of insulin: Secondary | ICD-10-CM

## 2020-01-13 DIAGNOSIS — E1159 Type 2 diabetes mellitus with other circulatory complications: Secondary | ICD-10-CM

## 2020-01-13 DIAGNOSIS — E1165 Type 2 diabetes mellitus with hyperglycemia: Secondary | ICD-10-CM

## 2020-01-13 DIAGNOSIS — E1140 Type 2 diabetes mellitus with diabetic neuropathy, unspecified: Secondary | ICD-10-CM

## 2020-01-13 MED ORDER — NOVOLOG FLEXPEN 100 UNIT/ML ~~LOC~~ SOPN: 6.0000 [IU] | PEN_INJECTOR | Freq: Three times a day (TID) | SUBCUTANEOUS | 11 refills | Status: DC

## 2020-01-13 MED ORDER — TRULICITY 1.5 MG/0.5ML ~~LOC~~ SOAJ: 1.5000 mg | SUBCUTANEOUS | 3 refills | Status: DC

## 2020-01-13 MED ORDER — INSULIN PEN NEEDLE 32G X 4 MM MISC: 1.0000 | Freq: Four times a day (QID) | 3 refills | Status: DC

## 2020-01-13 MED ORDER — EMPAGLIFLOZIN 10 MG PO TABS: 10.0000 mg | ORAL_TABLET | Freq: Every day | ORAL | 1 refills | Status: DC

## 2020-01-13 NOTE — Progress Notes (Signed)
Name: LINEA CALLES  Age/ Sex: 59 y.o., female   MRN/ DOB: [id number], Jan 07, 1961     PCP: Sonia Side., FNP   Reason for Endocrinology Evaluation: Type 2 Diabetes Mellitus  Initial Endocrine Consultative Visit: 10/07/2019    PATIENT IDENTIFIER: Ms. CALEAH TORTORELLI is a 59 y.o. female with a past medical history of HTn and T2DM. The patient has followed with Endocrinology clinic since 10/07/2019 for consultative assistance with management of her diabetes.  DIABETIC HISTORY:  Ms. Disbro was diagnosed with gestational diabetes in 1984, requiring insulin , she was off insulin until her next pregnancy in 1985 and has been on insulin ever since. Her hemoglobin A1c has ranged from 8.1% in 2016, peaking at 12.5% in 2020.   On her initial visit to our clinic, she had an A1c of 9.6%. She was on MDI regimen as well as victoza and metformin, which were continued and added jardiance.   SUBJECTIVE:   During the last visit (10/07/2019): A1c 9.6% , continued metformin, victoza and MDI regimen, added Jardiance   Today (01/13/2020): Ms. Sowder is here for a follow up on diabetes management.  She checks her blood sugars multiple times daily through CGM. She has been taking less insulin than prescribed, she does admit to taking prandial insulin after meals at times She was recently prescribed Abx by PCP and is currently using monistat for genital pruritus but no discharge      HOME DIABETES REGIMEN:  Tresiba  24 units daily - taking 22 units Novolog 12 units with each meal - taking 4 units Jardiance 10 mg daily  Metformin 1000 mg twice daily  Victoza 1.8 mg daily     Statin: yes ACE-I/ARB: yes     CONTINUOUS GLUCOSE MONITORING RECORD INTERPRETATION    Dates of Recording: 7/5-7/18/2021  Sensor description: freestyle libre  Results statistics:   CGM use % of time 51  Average and SD 229/35.9  Time in range    22    %  % Time Above 180 34  % Time above 250 40  % Time Below  target 4     Glycemic patterns summary: hyperglycemia through the day and most of the night, with a short period of optimal Bg overnight   Hyperglycemic episodes  All day   Hypoglycemic episodes occurred overnight   Overnight periods: trends down      Microvascular complications:   Neuropathy, hx of left foot ulcer, retinopathy   Denies: CKD   Last eye exam: Completed 06/2019  Macrovascular complications:   CAD, CHF  Denies:  PVD, CVA     HISTORY:  Past Medical History:  Past Medical History:  Diagnosis Date  . Adhesive capsulitis of shoulder    bilateral, Steroid injection Dr. Truman Hayward 1/12 bilaterally  . CAD (coronary artery disease)    nonobstructive. Last cardiac cath (2008) showing left circumflex with mid 50% stenosis and distal luminal irregularities. Also with RCA with mid to distal 30-40% stenosis. // Previously evaluated by [name], never followed up outpatient.  Marland Kitchen CAP (community acquired pneumonia) 06/15/2012   05/2012 CXR: Mild opacification of the posterior lung base on the lateral film  as cannot exclude infection/atelectasis.    . CHF (congestive heart failure) ([place])   . Diabetes mellitus 2007   Type II, insulin dependent  . Diabetic retinopathy   . GERD (gastroesophageal reflux disease)   . Glaucoma   . Hyperlipidemia   . Hypertension   . Obesity   .  Peripheral neuropathy    Past Surgical History:  Past Surgical History:  Procedure Laterality Date  . ANTERIOR CERVICAL DECOMP/DISCECTOMY FUSION N/A 06/16/2016   Procedure: Anterior Cervical Decompression/discectomy Fusion - Cervical six - Cervical seven;  Surgeon: Eustace Moore, MD;  Location: [facility];  Service: Neurosurgery;  Laterality: N/A;  Anterior Cervical Decompression/discectomy Fusion - Cervical six - Cervical seven  . APPENDECTOMY    . CARDIAC CATHETERIZATION    . CATARACT EXTRACTION    . GLAUCOMA SURGERY    . LEFT HEART CATH AND CORONARY ANGIOGRAPHY N/A 05/22/2018    Procedure: LEFT HEART CATH AND CORONARY ANGIOGRAPHY;  Surgeon: Troy Sine, MD;  Location: [facility];  Service: Cardiovascular;  Laterality: N/A;  . LEFT HEART CATHETERIZATION WITH CORONARY ANGIOGRAM N/A 01/24/2012   Procedure: LEFT HEART CATHETERIZATION WITH CORONARY ANGIOGRAM;  Surgeon: Sherren Mocha, MD;  Location: [facility] CATH LAB;  Service: Cardiovascular;  Laterality: N/A;  . POSTERIOR CERVICAL FUSION/FORAMINOTOMY N/A 10/22/2015   Procedure: Cervical three-Cervical Seven Posterior cervical fusion with lateral mass fixation,  Laminectomy Cervical Three-Cervical Seven;  Surgeon: Eustace Moore, MD;  Location: [facility];  Service: Neurosurgery;  Laterality: N/A;  Posterior  . TUBAL LIGATION      Social History:  reports that she quit smoking about 13 years ago. Her smoking use included cigarettes. She has a 2.10 pack-year smoking history. She has never used smokeless tobacco. She reports that she does not drink alcohol and does not use drugs. Family History:  Family History  Problem Relation Age of Onset  . Heart disease Mother   . Hypertension Sister   . Hypertension Sister   . Hypertension Sister   . Heart attack Neg Hx   . Stroke Neg Hx      HOME MEDICATIONS: Allergies as of 01/13/2020   No Known Allergies     Medication List       Accurate as of January 13, 2020 11:07 AM. If you have any questions, ask your nurse or doctor.        amitriptyline 25 MG tablet Commonly known as: ELAVIL Take 1 tablet (25 mg total) by mouth at bedtime.   aspirin 81 MG EC tablet Take 1 tablet (81 mg total) by mouth daily.   atorvastatin 80 MG tablet Commonly known as: LIPITOR Take 1 tablet (80 mg total) by mouth daily at 6 PM.   clopidogrel 75 MG tablet Commonly known as: PLAVIX Take 1 tablet (75 mg total) by mouth daily.   Combigan 0.2-0.5 % ophthalmic solution Generic drug: brimonidine-timolol Place 1 drop into both eyes 2 (two) times daily.   cyclobenzaprine 10 MG  tablet Commonly known as: FLEXERIL TAKE 1 TABLET(10 MG) BY MOUTH TWICE DAILY AS NEEDED FOR MUSCLE SPASMS   diclofenac sodium 1 % Gel Commonly known as: VOLTAREN Apply 2 g topically 3 (three) times daily as needed.   diphenhydrAMINE 25 MG tablet Commonly known as: BENADRYL Take 1 tablet (25 mg total) by mouth every 6 (six) hours as needed for itching or allergies.   dorzolamide 2 % ophthalmic solution Commonly known as: TRUSOPT Place 1 drop into both eyes 2 (two) times daily.   fluticasone 50 MCG/ACT nasal spray Commonly known as: Flonase Place 1 spray into both nostrils daily.   FreeStyle Libre 14 Day Reader Kerrin Mo 1 each by Does not apply route 4 (four) times daily.   FreeStyle Libre 14 Day Sensor Misc 1 each by Does not apply route QID.   furosemide 20 MG  tablet Commonly known as: LASIX Take 20 mg by mouth 2 (two) times daily.   gabapentin 300 MG capsule Commonly known as: NEURONTIN Take 300 mg by mouth 2 (two) times daily.   glucose blood test strip Commonly known as: OneTouch Verio USE TO CHECK BLOOD SUGAR 4 TIMES DAILY AS DIRECTED   Insulin Pen Needle 32G X 4 MM Misc Use to inject insulin up to 4 times daily   IRON PO Take 1 tablet by mouth 3 (three) times daily.   isosorbide mononitrate 60 MG 24 hr tablet Commonly known as: IMDUR Take 1 tablet (60 mg total) by mouth daily.   Jardiance 10 MG Tabs tablet Generic drug: empagliflozin Take 10 mg by mouth daily before breakfast.   lisinopril 2.5 MG tablet Commonly known as: ZESTRIL Take 1 tablet (2.5 mg total) by mouth daily.   metFORMIN 1000 MG tablet Commonly known as: GLUCOPHAGE TAKE 1 TABLET BY MOUTH TWICE DAILY WITH A MEAL   metoprolol tartrate 50 MG tablet Commonly known as: LOPRESSOR Take 1.5 tablets (75 mg total) by mouth 2 (two) times daily.   nitroGLYCERIN 0.4 MG SL tablet Commonly known as: NITROSTAT Place 1 tablet (0.4 mg total) under the tongue every 5 (five) minutes as needed for chest  pain.   NovoLOG PenFill cartridge Generic drug: insulin aspart Inject 4 Units into the skin 2 (two) times daily with a meal.   OneTouch Delica Lancets Fine Misc Check blood sugar as instructed up to 3 times a day   oxyCODONE-acetaminophen 5-325 MG tablet Commonly known as: PERCOCET/ROXICET Take 1 tablet by mouth every 8 (eight) hours as needed for severe pain.   pantoprazole 40 MG tablet Commonly known as: PROTONIX Take 1 tablet (40 mg total) by mouth daily.   ranolazine 500 MG 12 hr tablet Commonly known as: RANEXA Take 1 tablet (500 mg total) by mouth 2 (two) times daily.   Santyl ointment Generic drug: collagenase Apply 1 application topically daily as needed (foot).   Tyler Aas FlexTouch 200 UNIT/ML FlexTouch Pen Generic drug: insulin degludec Inject 24 Units into the skin.   triamcinolone cream 0.1 % Commonly known as: KENALOG Apply 1 application topically as needed.   Victoza 18 MG/3ML Sopn Generic drug: liraglutide INJECT 1.2 MG TOTAL INTO THE SKIN DAILY        OBJECTIVE:   Vital Signs: BP 128/68 (BP Location: Left Arm, Patient Position: Sitting, Cuff Size: Large)   Pulse 65   Ht 5\' 6"  (1.676 m)   Wt 262 lb 12.8 oz (119.2 kg)   LMP 02/13/2011   SpO2 93%   BMI 42.42 kg/m   Wt Readings from Last 3 Encounters:  01/13/20 262 lb 12.8 oz (119.2 kg)  11/12/19 256 lb (116.1 kg)  10/07/19 266 lb 3.2 oz (120.7 kg)     Exam: General: Pt appears well and is in NAD  Lungs: Clear with good BS bilat with no rales, rhonchi, or wheezes  Heart: RRR with normal S1 and S2 and no gallops; no murmurs; no rub  Extremities: 1+  pretibial edema.   Neuro: MS is good with appropriate affect, pt is alert and Ox3      DM foot exam: 10/07/2019  The skin of the feet iswithout sores or ulcerations. The pedal pulses are undetectable The sensation is decreased  to a screening 5.07, 10 gram monofilament bilaterally       DATA REVIEWED:  Lab Results  Component Value  Date   HGBA1C 9.6 (A) 10/07/2019  HGBA1C 10.1 (A) 02/21/2018   HGBA1C 11.5 11/10/2017   Lab Results  Component Value Date   MICROALBUR 2.35 (H) 03/08/2013   LDLCALC 48 07/17/2018   CREATININE 1.12 10/07/2019   Lab Results  Component Value Date   MICRALBCREAT 14.7 03/08/2013     Lab Results  Component Value Date   CHOL 116 07/17/2018   HDL 51 07/17/2018   LDLCALC 48 07/17/2018   TRIG 83 07/17/2018   CHOLHDL 2.3 07/17/2018       05/30/2019 Bun/Cr 15/1.06  GFR > 60 A1c 12.0%  ASSESSMENT / PLAN / RECOMMENDATIONS:   1) Type 2 Diabetes Mellitus, Poorly controlled, With retinopathic,  neuropathic, and macrovascular  complications complications - Most recent A1c of 9.3 ( pr pt) %. Goal A1c < 7.0 %.   - Pt states she had an A1c through her PCP at 9.3% , records not available. CMA attempted to call their office but there was along wait time - Pt has been using less insulin than prescribed, will adjust as below  - I am also going to switch victoza to trulicity to reduce daily injection burden  - Will not increase Jardiance due to current possible yeast infection that started after a course of antibiotic for a foot infection   MEDICATIONS: Decrease Tresiba to 20 units daily  - Increase Novolog to 6 units with each meal  - Continue Jardiance 10 mg daily - Continue Metformin 1000 mg twice daily  - STOP Victoza  - Start Trulicity 1.5 mg ONCE WEEKLY    EDUCATION / INSTRUCTIONS:  BG monitoring instructions: Patient is instructed to check her blood sugars 4 times a day, before meals and bedtime   Call [facility] if: BG persistently < 70  . I reviewed the Rule of 15 for the treatment of hypoglycemia in detail with the patient. Literature supplied.    2) Diabetic complications:   Eye: Does have known diabetic retinopathy.   Neuro/ Feet: Does have known diabetic peripheral neuropathy .   Renal: Patient does not have known baseline CKD. She   is on an  ACEI/ARB at present.    F/U in 4 months    Signed electronically by: Mack Guise, MD  [facility]  [facility] [address] [place], [place] [address] Phone: [phone] FAX: [phone]   CC: [address] [place] [address] Phone: [phone]  Fax: [phone]  Return to Endocrinology clinic as below: Future Appointments  Date Time Provider [place]  01/14/2020 10:15 AM Velora Heckler [facility]  01/16/2020  4:45 PM Trula Slade, DPM [facility]  01/22/2020  1:00 PM [facility] V[facility] 4 [facility]  03/17/2020  9:15 AM Liliane Shi, PA-C [facility]

## 2020-01-13 NOTE — Patient Instructions (Addendum)
-   Decrease Tresiba to 20 units daily  - Increase Novolog to 6 units with each meal  - Continue Jardiance 10 mg daily - Continue Metformin 1000 mg twice daily  - STOP Victoza  - Start Trulicity 1.5 mg ONCE WEEKLY    HOW TO TREAT LOW BLOOD SUGARS (Blood sugar LESS THAN 70 MG/DL)  Please follow the RULE OF 15 for the treatment of hypoglycemia treatment (when your (blood sugars are less than 70 mg/dL)    STEP 1: Take 15 grams of carbohydrates when your blood sugar is low, which includes:   3-4 GLUCOSE TABS  OR  3-4 OZ OF JUICE OR REGULAR SODA OR  ONE TUBE OF GLUCOSE GEL     STEP 2: RECHECK blood sugar in 15 MINUTES STEP 3: If your blood sugar is still low at the 15 minute recheck --> then, go back to STEP 1 and treat AGAIN with another 15 grams of carbohydrates.

## 2020-01-14 ENCOUNTER — Other Ambulatory Visit: Payer: Medicare HMO | Admitting: Orthotics

## 2020-01-14 ENCOUNTER — Other Ambulatory Visit: Payer: Self-pay | Admitting: Podiatry

## 2020-01-14 DIAGNOSIS — I739 Peripheral vascular disease, unspecified: Secondary | ICD-10-CM

## 2020-01-14 DIAGNOSIS — IMO0002 Reserved for concepts with insufficient information to code with codable children: Secondary | ICD-10-CM

## 2020-01-14 DIAGNOSIS — L97509 Non-pressure chronic ulcer of other part of unspecified foot with unspecified severity: Secondary | ICD-10-CM

## 2020-01-14 DIAGNOSIS — E1165 Type 2 diabetes mellitus with hyperglycemia: Secondary | ICD-10-CM

## 2020-01-14 DIAGNOSIS — E1140 Type 2 diabetes mellitus with diabetic neuropathy, unspecified: Secondary | ICD-10-CM

## 2020-01-16 ENCOUNTER — Ambulatory Visit (INDEPENDENT_AMBULATORY_CARE_PROVIDER_SITE_OTHER): Payer: Medicare HMO | Admitting: Podiatry

## 2020-01-16 ENCOUNTER — Other Ambulatory Visit: Payer: Self-pay

## 2020-01-16 DIAGNOSIS — E1140 Type 2 diabetes mellitus with diabetic neuropathy, unspecified: Secondary | ICD-10-CM | POA: Diagnosis not present

## 2020-01-16 DIAGNOSIS — L97509 Non-pressure chronic ulcer of other part of unspecified foot with unspecified severity: Secondary | ICD-10-CM | POA: Diagnosis not present

## 2020-01-16 DIAGNOSIS — IMO0002 Reserved for concepts with insufficient information to code with codable children: Secondary | ICD-10-CM

## 2020-01-16 DIAGNOSIS — E1165 Type 2 diabetes mellitus with hyperglycemia: Secondary | ICD-10-CM | POA: Diagnosis not present

## 2020-01-21 NOTE — Progress Notes (Signed)
Subjective: 59 year old female presents the office today for follow-up evaluation of superficial wounds to both of her feet.  Overall she states that she is doing much better and she has no concerns today.  Denies any drainage or pus.  Denies any swelling or redness. Denies any systemic complaints such as fevers, chills, nausea, vomiting. No acute changes since last appointment, and no other complaints at this time.   Objective: AAO x3, NAD DP/PT pulses palpable bilaterally, CRT less than 3 seconds Hyperkeratotic lesions with superficial areas of skin breakdown present to distal aspect the right hallux as well as left second toe.  Appears to be much improved.  There is no surrounding erythema, ascending cellulitis.  No fluctuation or crepitation.  There is no malodor.  Hyperkeratotic tissue to the posterior lateral aspect of the heel although improved and the color is also improved since debridement last appointment. No pain with calf compression, swelling, warmth, erythema  Assessment: Superficial skin breakdown to the digits  Plan: -All treatment options discussed with the patient including all alternatives, risks, complications.  -Debrided the areas without any complications or bleeding x2 utilizing the 312 and scalpel down to healthy tissue.  I continue with small amounts of antibiotic ointment daily.  Continue offloading at all times.  She is scheduled for formal arterial studies next week. -Patient encouraged to call the office with any questions, concerns, change in symptoms.   Trula Slade DPM

## 2020-01-22 ENCOUNTER — Other Ambulatory Visit: Payer: Self-pay

## 2020-01-22 ENCOUNTER — Ambulatory Visit (HOSPITAL_COMMUNITY)
Admission: RE | Admit: 2020-01-22 | Discharge: 2020-01-22 | Disposition: A | Discharge status: Home / Self Care | Payer: Medicare HMO | Source: Ambulatory Visit | Attending: Cardiology | Admitting: Cardiology

## 2020-01-22 DIAGNOSIS — I739 Peripheral vascular disease, unspecified: Secondary | ICD-10-CM | POA: Diagnosis not present

## 2020-01-22 DIAGNOSIS — E1140 Type 2 diabetes mellitus with diabetic neuropathy, unspecified: Secondary | ICD-10-CM | POA: Diagnosis not present

## 2020-01-22 DIAGNOSIS — E1165 Type 2 diabetes mellitus with hyperglycemia: Secondary | ICD-10-CM | POA: Diagnosis not present

## 2020-01-22 DIAGNOSIS — L97509 Non-pressure chronic ulcer of other part of unspecified foot with unspecified severity: Secondary | ICD-10-CM

## 2020-01-22 DIAGNOSIS — L97511 Non-pressure chronic ulcer of other part of right foot limited to breakdown of skin: Secondary | ICD-10-CM

## 2020-01-22 DIAGNOSIS — IMO0002 Reserved for concepts with insufficient information to code with codable children: Secondary | ICD-10-CM

## 2020-01-24 ENCOUNTER — Ambulatory Visit: Payer: Medicare HMO | Admitting: Podiatry

## 2020-01-26 ENCOUNTER — Other Ambulatory Visit: Payer: Self-pay | Admitting: Physician Assistant

## 2020-02-25 ENCOUNTER — Other Ambulatory Visit: Payer: Self-pay

## 2020-02-25 ENCOUNTER — Encounter: Payer: Self-pay | Admitting: Podiatry

## 2020-02-25 ENCOUNTER — Ambulatory Visit (INDEPENDENT_AMBULATORY_CARE_PROVIDER_SITE_OTHER): Payer: Medicare HMO | Admitting: Podiatry

## 2020-02-25 DIAGNOSIS — E1165 Type 2 diabetes mellitus with hyperglycemia: Secondary | ICD-10-CM

## 2020-02-25 DIAGNOSIS — L84 Corns and callosities: Secondary | ICD-10-CM | POA: Diagnosis not present

## 2020-02-25 DIAGNOSIS — IMO0002 Reserved for concepts with insufficient information to code with codable children: Secondary | ICD-10-CM

## 2020-02-25 DIAGNOSIS — B351 Tinea unguium: Secondary | ICD-10-CM | POA: Diagnosis not present

## 2020-02-25 DIAGNOSIS — M79675 Pain in left toe(s): Secondary | ICD-10-CM

## 2020-02-25 DIAGNOSIS — E1140 Type 2 diabetes mellitus with diabetic neuropathy, unspecified: Secondary | ICD-10-CM

## 2020-02-25 DIAGNOSIS — M79674 Pain in right toe(s): Secondary | ICD-10-CM | POA: Diagnosis not present

## 2020-02-25 NOTE — Progress Notes (Signed)
Subjective: 59 year old female presents the office today for follow-up evaluation of superficial wounds to both of her feet.  States that she is doing much better.  She thinks the areas have healed and she is having no pain.  Denies any swelling redness or any drainage.  She has no other concerns today. Denies any systemic complaints such as fevers, chills, nausea, vomiting. No acute changes since last appointment, and no other complaints at this time.   Last A1c 9.6 (10/07/2019)  ----  ABI 01/24/2020 Summary:  Right: Resting right ankle-brachial index is within normal range. No  evidence of significant right lower extremity arterial disease. The right  toe-brachial index is normal.   ABIs decreased by .16 and TBIs decreased by 1.16, however, still within  normal range.  Left: Resting left ankle-brachial index is within normal range. No  evidence of significant left lower extremity arterial disease. The left  toe-brachial index is normal.   ABIs decreased by .14, however, still within normal range.  ---  Objective: AAO x3, NAD DP/PT pulses palpable bilaterally, CRT less than 3 seconds At the distal aspects of the hallux as well as second toe skin hyperkeratotic tissue.  Upon debridement there is no underlying ulceration drainage or signs of infection.  With the areas of healed.  The nails on the fourth and fifth toes bilaterally hypertrophic, dystrophic, discomfort.  No edema, erythema or signs of infection noted. No pain with calf compression, swelling, warmth, erythema  Assessment: Preulcerative calluses; symptomatic onychomycosis  Plan: -All treatment options discussed with the patient including all alternatives, risks, complications. -Debrided preulcerative calluses x4 without any complications or bleeding.  Recommend moisturizer and offloading. -Debrided the nails x4 without any complications or bleeding -Discussed daily foot inspection.  Trula Slade DPM

## 2020-03-13 ENCOUNTER — Other Ambulatory Visit: Payer: Self-pay | Admitting: Internal Medicine

## 2020-03-13 ENCOUNTER — Telehealth: Payer: Self-pay

## 2020-03-13 MED ORDER — TRESIBA FLEXTOUCH 100 UNIT/ML ~~LOC~~ SOPN: 20.0000 [IU] | PEN_INJECTOR | Freq: Every day | SUBCUTANEOUS | 6 refills | Status: DC

## 2020-03-13 NOTE — Telephone Encounter (Signed)
Pt's pharmacy called and stated that they had a current Rx for Tresiba U200 with sig for pt to take 30 units daily.   This does not align with pt's last office visit note.  Rx can be updated, but would you like pt to be changed to U100 since she is only taking 20 units?

## 2020-03-16 NOTE — Progress Notes (Signed)
Cardiology Office Note:    Date:  03/17/2020   ID:  [name], DOB May 28, 1961, MRN [id number]  PCP:  Sonia Side., FNP  [facility] Cardiologist:  Sherren Mocha, MD   [facility] Electrophysiologist:  None   Referring MD: Sonia Side., FNP   Chief Complaint:  Follow-up (CAD)    Patient Profile:    [name] is a 59 y.o. female with:   Coronary artery disease  ? NSTEMI 04/2018 >> Cath w/ diffuse disease >> no targets for CABG >> Med Rx  Cath: pLAD 50, mLAD 80, dLAD 70; oD1 95, mLCx 90, dLCx 80, OM2 95, mRCA 30, dRCA 30  PCI could be considered if medical Rx failed  Chronic Systolic CHF ? Echocardiogram 7/13: EF 35-40 ? Echocardiogram 11/16: EF 55-60 ? Echocardiogram 8/18: EF 50-55 ? Echocardiogram 11/19: EF 45-50 ? Non-ischemic cardiomyopathy (CM out of proportion to extensive CAD)   Morbid obesity   Diabetes mellitus   Hypertension   Hyperlipidemia   Lung nodule  Prior CV studies: ABIs 01/22/20   Echo 05/23/18 Mild conc LVH, EF 45-50, diff HK, normal RVSF  Cardiac Catheterization11/26/19 LAD prox 50, mid 80, dist 70; D1 ost 95 LCx mid 90, dist 80; OM2 95 RCA mid 30, dist 30 EF 25-35, diff HK involving mid distal anterolateral wall and apex extending to mid distal portion of inf wall  Echo 02/07/17 EF 50-55, no RWMA, Gr 1 DD, normal RVSF  Echo 05/01/15 Mild LVH, EF 55-60, no RWMA, Gr 1 DD  Echocardiogram 7/13:  limited study, EF 35-40%.   LHC 7/13:  Mid LAD 50% within a myocardial bridge, EF 40-45%.   History of Present Illness:    [name] was last seen in 5/21.  She was doing well without recurrent angina since starting on Ranolazine.  She returns for follow up.  She is here alone.  She has not been having chest pain or significant shortness of breath.  She has not had orthopnea, leg swelling or syncope.        Past Medical History:  Diagnosis Date  . Adhesive capsulitis of shoulder    bilateral, Steroid  injection Dr. Truman Hayward 1/12 bilaterally  . CAD (coronary artery disease)    nonobstructive. Last cardiac cath (2008) showing left circumflex with mid 50% stenosis and distal luminal irregularities. Also with RCA with mid to distal 30-40% stenosis. // Previously evaluated by [facility], never followed up outpatient.  Marland Kitchen CAP (community acquired pneumonia) 06/15/2012   05/2012 CXR: Mild opacification of the posterior lung base on the lateral film  as cannot exclude infection/atelectasis.    . CHF (congestive heart failure) ([place])   . Diabetes mellitus 2007   Type II, insulin dependent  . Diabetic retinopathy   . GERD (gastroesophageal reflux disease)   . Glaucoma   . Hyperlipidemia   . Hypertension   . Obesity   . Peripheral neuropathy     Current Medications: Current Meds  Medication Sig  . amitriptyline (ELAVIL) 25 MG tablet Take 1 tablet (25 mg total) by mouth at bedtime.  Marland Kitchen aspirin EC 81 MG EC tablet Take 1 tablet (81 mg total) by mouth daily.  Marland Kitchen atorvastatin (LIPITOR) 80 MG tablet Take 1 tablet (80 mg total) by mouth daily at 6 PM.  . clopidogrel (PLAVIX) 75 MG tablet Take 1 tablet (75 mg total) by mouth daily.  . COMBIGAN 0.2-0.5 % ophthalmic solution Place 1 drop into both eyes 2 (two) times  daily.  . Continuous Blood Gluc Receiver (FREESTYLE LIBRE 14 DAY READER) DEVI 1 each by Does not apply route 4 (four) times daily.  . Continuous Blood Gluc Sensor (FREESTYLE LIBRE 14 DAY SENSOR) MISC 1 each by Does not apply route QID.  Marland Kitchen cyclobenzaprine (FLEXERIL) 10 MG tablet TAKE 1 TABLET(10 MG) BY MOUTH TWICE DAILY AS NEEDED FOR MUSCLE SPASMS  . diclofenac sodium (VOLTAREN) 1 % GEL Apply 2 g topically 3 (three) times daily as needed.  . diphenhydrAMINE (BENADRYL) 25 MG tablet Take 1 tablet (25 mg total) by mouth every 6 (six) hours as needed for itching or allergies.  Marland Kitchen dorzolamide (TRUSOPT) 2 % ophthalmic solution Place 1 drop into both eyes 2 (two) times daily.  . Dulaglutide (TRULICITY)  1.5 WU/1.3KG SOPN Inject 0.5 mLs (1.5 mg total) into the skin once a week.  . empagliflozin (JARDIANCE) 10 MG TABS tablet Take 1 tablet (10 mg total) by mouth daily before breakfast.  . fluticasone (FLONASE) 50 MCG/ACT nasal spray Place 1 spray into both nostrils daily.  . furosemide (LASIX) 20 MG tablet Take 20 mg by mouth 2 (two) times daily.  Marland Kitchen gabapentin (NEURONTIN) 300 MG capsule Take 300 mg by mouth 2 (two) times daily.  Marland Kitchen glucose blood (ONETOUCH VERIO) test strip USE TO CHECK BLOOD SUGAR 4 TIMES DAILY AS DIRECTED  . insulin aspart (NOVOLOG FLEXPEN) 100 UNIT/ML FlexPen Inject 6 Units into the skin 3 (three) times daily with meals.  . insulin degludec (TRESIBA FLEXTOUCH) 100 UNIT/ML FlexTouch Pen Inject 20 Units into the skin daily.  . Insulin Pen Needle 32G X 4 MM MISC Use to inject insulin up to 4 times daily  . Insulin Pen Needle 32G X 4 MM MISC 1 Device by Does not apply route in the morning, at noon, in the evening, and at bedtime.  . IRON PO Take 1 tablet by mouth 3 (three) times daily.  . isosorbide mononitrate (IMDUR) 60 MG 24 hr tablet Take 1 tablet (60 mg total) by mouth daily.  Marland Kitchen lisinopril (ZESTRIL) 2.5 MG tablet TAKE 1 TABLET(2.5 MG) BY MOUTH DAILY  . metFORMIN (GLUCOPHAGE) 1000 MG tablet TAKE 1 TABLET BY MOUTH TWICE DAILY WITH A MEAL  . metoprolol tartrate (LOPRESSOR) 50 MG tablet Take 1.5 tablets (75 mg total) by mouth 2 (two) times daily.  . nitroGLYCERIN (NITROSTAT) 0.4 MG SL tablet Place 1 tablet (0.4 mg total) under the tongue every 5 (five) minutes as needed for chest pain.  Glory Rosebush DELICA LANCETS FINE MISC Check blood sugar as instructed up to 3 times a day  . oxyCODONE-acetaminophen (PERCOCET/ROXICET) 5-325 MG tablet Take 1 tablet by mouth every 8 (eight) hours as needed for severe pain.  . pantoprazole (PROTONIX) 40 MG tablet Take 1 tablet (40 mg total) by mouth daily.  . ranolazine (RANEXA) 500 MG 12 hr tablet Take 1 tablet (500 mg total) by mouth 2 (two) times  daily.  Marland Kitchen SANTYL ointment Apply 1 application topically daily as needed (foot).   . triamcinolone cream (KENALOG) 0.1 % Apply 1 application topically as needed.     Allergies:   Patient has no known allergies.   Social History   Tobacco Use  . Smoking status: Former Smoker    Packs/day: 0.30    Years: 7.00    Pack years: 2.10    Types: Cigarettes    Quit date: 06/27/2006    Years since quitting: 13.7  . Smokeless tobacco: Never Used  Vaping Use  . Vaping Use: Never  used  Substance Use Topics  . Alcohol use: No    Alcohol/week: 0.0 standard drinks  . Drug use: No     Family Hx: The patient's family history includes Heart disease in her mother; Hypertension in her sister, sister, and sister. There is no history of Heart attack or Stroke.  ROS   EKGs/Labs/Other Test Reviewed:    EKG:  EKG is not  ordered today.  The ekg ordered today demonstrates n/a  Recent Labs: 05/30/2019: ALT 16; Hemoglobin 12.0; Platelets 322 10/07/2019: BUN 26; Creatinine, Ser 1.12; Potassium 3.7; Sodium 137   Recent Lipid Panel Lab Results  Component Value Date/Time   CHOL 116 07/17/2018 11:05 AM   TRIG 83 07/17/2018 11:05 AM   HDL 51 07/17/2018 11:05 AM   CHOLHDL 2.3 07/17/2018 11:05 AM   CHOLHDL 4.1 05/21/2018 06:00 PM   LDLCALC 48 07/17/2018 11:05 AM    Physical Exam:    VS:  BP (!) 118/56   Pulse 85   Ht 5\' 6"  (1.676 m)   Wt 254 lb (115.2 kg)   LMP 02/13/2011   SpO2 91%   BMI 41.00 kg/m     Wt Readings from Last 3 Encounters:  03/17/20 254 lb (115.2 kg)  01/13/20 262 lb 12.8 oz (119.2 kg)  11/12/19 256 lb (116.1 kg)     Constitutional:      Appearance: Healthy appearance. Not in distress.  Neck:     Vascular: No JVR.  Pulmonary:     Effort: Pulmonary effort is normal.     Breath sounds: No wheezing. No rales.  Cardiovascular:     Normal rate. Regular rhythm. Normal S1. Normal S2.     Murmurs: There is no murmur.  Edema:    Peripheral edema absent.  Abdominal:      Palpations: Abdomen is soft.  Musculoskeletal:     Cervical back: Neck supple. Skin:    General: Skin is warm and dry.  Neurological:     Mental Status: Alert and oriented to person, place and time.     Cranial Nerves: Cranial nerves are intact.       ASSESSMENT & PLAN:    1. Coronary artery disease involving native coronary artery of native heart with angina pectoris [facility]) S/p NSTEMIin November 2019.Cardiac catheterization demonstrated diffuse small vessel and distal vessel disease. The culprit was felt to be the distal OM1 or the first diagonal. Both vessels are small in caliber and are not likely amenable to PCI. Her anatomy is also not amenable to CABG.She has been managed medically.    She is currently doing well without anginal symptoms on her current medical regimen.  Continue current dose of aspirin, clopidogrel, atorvastatin, isosorbide mononitrate, metoprolol tartrate and ranolazine.  Arrange follow-up CMET.  2. Chronic systolic CHF (congestive heart failure) (HCC) EF 45-50.  NYHA IIb.  Nonischemic cardiomyopathy.  Volume status is stable.  Continue current dose of lisinopril, metoprolol tartrate, isosorbide mononitrate, furosemide.  Arrange follow-up CMET.  3. Essential hypertension The patient's blood pressure is controlled on her current regimen.  Continue current therapy.   4. Type 2 diabetes mellitus with complication, with long-term current use of insulin (HCC) Recent A1c improved.  She remains on empagliflozin.  Continue follow-up with primary care.  5. Mixed hyperlipidemia Continue atorvastatin.  Arrange fasting CMET, lipids.    Dispo:  Return in about 6 months (around 09/14/2020) for Routine Follow Up, w/ Dr. Burt Knack, or Richardson Dopp, PA-C, in person.   Medication Adjustments/Labs and Tests Ordered:  Current medicines are reviewed at length with the patient today.  Concerns regarding medicines are outlined above.  Tests Ordered: Orders Placed This Encounter   Procedures  . Comprehensive metabolic panel  . Lipid panel   Medication Changes: No orders of the defined types were placed in this encounter.   Signed, Richardson Dopp, PA-C  03/17/2020 9:48 AM    [facility] [address], [place], [place]  [address] Phone: [phone]; Fax: [phone]

## 2020-03-17 ENCOUNTER — Ambulatory Visit: Payer: Medicare HMO | Admitting: Physician Assistant

## 2020-03-17 ENCOUNTER — Other Ambulatory Visit: Payer: Self-pay

## 2020-03-17 ENCOUNTER — Ambulatory Visit (INDEPENDENT_AMBULATORY_CARE_PROVIDER_SITE_OTHER): Payer: Medicare HMO | Admitting: Physician Assistant

## 2020-03-17 ENCOUNTER — Encounter: Payer: Self-pay | Admitting: Physician Assistant

## 2020-03-17 VITALS — BP 118/56 | HR 85 | Ht 66.0 in | Wt 254.0 lb

## 2020-03-17 DIAGNOSIS — I10 Essential (primary) hypertension: Secondary | ICD-10-CM

## 2020-03-17 DIAGNOSIS — I5022 Chronic systolic (congestive) heart failure: Secondary | ICD-10-CM

## 2020-03-17 DIAGNOSIS — I25119 Atherosclerotic heart disease of native coronary artery with unspecified angina pectoris: Secondary | ICD-10-CM

## 2020-03-17 DIAGNOSIS — Z794 Long term (current) use of insulin: Secondary | ICD-10-CM

## 2020-03-17 DIAGNOSIS — E782 Mixed hyperlipidemia: Secondary | ICD-10-CM

## 2020-03-17 DIAGNOSIS — E118 Type 2 diabetes mellitus with unspecified complications: Secondary | ICD-10-CM | POA: Diagnosis not present

## 2020-03-17 NOTE — Patient Instructions (Addendum)
Medication Instructions:  Your physician recommends that you continue on your current medications as directed. Please refer to the Current Medication list given to you today.  *If you need a refill on your cardiac medications before your next appointment, please call your pharmacy*  Lab Work: Your physician recommends that you return for lab work in 2 weeks on 03/31/20 **The lab is open from 7:30AM-4:30PM** you may come anytime between these hours, come fasting your cholesterol will be checked.  If you have labs (blood work) drawn today and your tests are completely normal, you will receive your results only by: Marland Kitchen MyChart Message (if you have MyChart) OR . A paper copy in the mail If you have any lab test that is abnormal or we need to change your treatment, we will call you to review the results.  Testing/Procedures: None ordered today  Follow-Up: On 09/17/2020 at 8:20AM with Sherren Mocha, MD

## 2020-03-31 ENCOUNTER — Other Ambulatory Visit: Payer: Medicare HMO

## 2020-04-06 ENCOUNTER — Other Ambulatory Visit: Payer: Self-pay | Admitting: Family

## 2020-04-06 DIAGNOSIS — Z1231 Encounter for screening mammogram for malignant neoplasm of breast: Secondary | ICD-10-CM

## 2020-04-28 ENCOUNTER — Ambulatory Visit: Payer: Medicare HMO | Admitting: Podiatry

## 2020-05-01 ENCOUNTER — Other Ambulatory Visit: Payer: Self-pay | Admitting: Family

## 2020-05-01 DIAGNOSIS — N6489 Other specified disorders of breast: Secondary | ICD-10-CM

## 2020-05-11 ENCOUNTER — Other Ambulatory Visit: Payer: Self-pay

## 2020-05-11 ENCOUNTER — Ambulatory Visit (INDEPENDENT_AMBULATORY_CARE_PROVIDER_SITE_OTHER): Payer: Medicare HMO | Admitting: Podiatry

## 2020-05-11 DIAGNOSIS — E1140 Type 2 diabetes mellitus with diabetic neuropathy, unspecified: Secondary | ICD-10-CM | POA: Diagnosis not present

## 2020-05-11 DIAGNOSIS — B351 Tinea unguium: Secondary | ICD-10-CM

## 2020-05-11 DIAGNOSIS — M79675 Pain in left toe(s): Secondary | ICD-10-CM | POA: Diagnosis not present

## 2020-05-11 DIAGNOSIS — L84 Corns and callosities: Secondary | ICD-10-CM

## 2020-05-11 DIAGNOSIS — E1165 Type 2 diabetes mellitus with hyperglycemia: Secondary | ICD-10-CM

## 2020-05-11 DIAGNOSIS — IMO0002 Reserved for concepts with insufficient information to code with codable children: Secondary | ICD-10-CM

## 2020-05-11 DIAGNOSIS — M79674 Pain in right toe(s): Secondary | ICD-10-CM

## 2020-05-11 NOTE — Patient Instructions (Signed)
Diabetes Mellitus and Foot Care Foot care is an important part of your health, especially when you have diabetes. Diabetes may cause you to have problems because of poor blood flow (circulation) to your feet and legs, which can cause your skin to:  Become thinner and drier.  Break more easily.  Heal more slowly.  Peel and crack. You may also have nerve damage (neuropathy) in your legs and feet, causing decreased feeling in them. This means that you may not notice minor injuries to your feet that could lead to more serious problems. Noticing and addressing any potential problems early is the best way to prevent future foot problems. How to care for your feet Foot hygiene  Wash your feet daily with warm water and mild soap. Do not use hot water. Then, pat your feet and the areas between your toes until they are completely dry. Do not soak your feet as this can dry your skin.  Trim your toenails straight across. Do not dig under them or around the cuticle. File the edges of your nails with an emery board or nail file.  Apply a moisturizing lotion or petroleum jelly to the skin on your feet and to dry, brittle toenails. Use lotion that does not contain alcohol and is unscented. Do not apply lotion between your toes. Shoes and socks  Wear clean socks or stockings every day. Make sure they are not too tight. Do not wear knee-high stockings since they may decrease blood flow to your legs.  Wear shoes that fit properly and have enough cushioning. Always look in your shoes before you put them on to be sure there are no objects inside.  To break in new shoes, wear them for just a few hours a day. This prevents injuries on your feet. Wounds, scrapes, corns, and calluses  Check your feet daily for blisters, cuts, bruises, sores, and redness. If you cannot see the bottom of your feet, use a mirror or ask someone for help.  Do not cut corns or calluses or try to remove them with medicine.  If you  find a minor scrape, cut, or break in the skin on your feet, keep it and the skin around it clean and dry. You may clean these areas with mild soap and water. Do not clean the area with peroxide, alcohol, or iodine.  If you have a wound, scrape, corn, or callus on your foot, look at it several times a day to make sure it is healing and not infected. Check for: ? Redness, swelling, or pain. ? Fluid or blood. ? Warmth. ? Pus or a bad smell. General instructions  Do not cross your legs. This may decrease blood flow to your feet.  Do not use heating pads or hot water bottles on your feet. They may burn your skin. If you have lost feeling in your feet or legs, you may not know this is happening until it is too late.  Protect your feet from hot and cold by wearing shoes, such as at the beach or on hot pavement.  Schedule a complete foot exam at least once a year (annually) or more often if you have foot problems. If you have foot problems, report any cuts, sores, or bruises to your health care provider immediately. Contact a health care provider if:  You have a medical condition that increases your risk of infection and you have any cuts, sores, or bruises on your feet.  You have an injury that is not   healing.  You have redness on your legs or feet.  You feel burning or tingling in your legs or feet.  You have pain or cramps in your legs and feet.  Your legs or feet are numb.  Your feet always feel cold.  You have pain around a toenail. Get help right away if:  You have a wound, scrape, corn, or callus on your foot and: ? You have pain, swelling, or redness that gets worse. ? You have fluid or blood coming from the wound, scrape, corn, or callus. ? Your wound, scrape, corn, or callus feels warm to the touch. ? You have pus or a bad smell coming from the wound, scrape, corn, or callus. ? You have a fever. ? You have a red line going up your leg. Summary  Check your feet every day  for cuts, sores, red spots, swelling, and blisters.  Moisturize feet and legs daily.  Wear shoes that fit properly and have enough cushioning.  If you have foot problems, report any cuts, sores, or bruises to your health care provider immediately.  Schedule a complete foot exam at least once a year (annually) or more often if you have foot problems. This information is not intended to replace advice given to you by your health care provider. Make sure you discuss any questions you have with your health care provider. Document Revised: 03/06/2019 Document Reviewed: 07/15/2016 Elsevier Patient Education  2020 [employer]  

## 2020-05-19 NOTE — Progress Notes (Signed)
Subjective: 59 y.o. returns the office today for painful, elongated, thickened toenails which she cannot trim herself as well as for preulcerative calluses the tips of the first and second toes. Denies any redness or drainage around the nails. Denies any acute changes since last appointment and no new complaints today. Denies any systemic complaints such as fevers, chills, nausea, vomiting.   PCP: Sonia Side., FNP  Endocrinologist: Dr. Kelton Pillar last seen 01/13/2020  A1c: 9.6 on 10/07/2019  Objective: AAO 3, NAD DP/PT pulses palpable, CRT less than 3 seconds Sensation decreased with Semmes Weinstein monofilament. Nails hypertrophic, dystrophic, elongated, brittle, discolored 10. There is tenderness overlying the nails 1-5 bilaterally. There is no surrounding erythema or drainage along the nail sites. Preulcerative hyperkeratotic lesion at the distal aspect of bilateral hallux as well as second toes.  No underlying ulceration drainage or signs of infection. No open lesions or pre-ulcerative lesions are identified. No other areas of tenderness bilateral lower extremities. No overlying edema, erythema, increased warmth. No pain with calf compression, swelling, warmth, erythema.  Assessment: Patient presents with symptomatic onychomycosis, preulcerative calluses with uncontrolled diabetes  Plan: -Treatment options including alternatives, risks, complications were discussed -Nails sharply debrided 10 without complication/bleeding. -Debrided hyperkeratotic lesions x4 without complications or bleeding. -Discussed daily foot inspection. If there are any changes, to call the office immediately.  -Follow-up in 3 months or sooner if any problems are to arise. In the meantime, encouraged to call the office with any questions, concerns, changes symptoms.  Celesta Gentile, DPM

## 2020-06-03 ENCOUNTER — Other Ambulatory Visit: Payer: Self-pay | Admitting: Family

## 2020-06-03 DIAGNOSIS — N6489 Other specified disorders of breast: Secondary | ICD-10-CM

## 2020-06-11 ENCOUNTER — Ambulatory Visit: Payer: Medicare HMO

## 2020-06-11 ENCOUNTER — Inpatient Hospital Stay: Admission: RE | Admit: 2020-06-11 | Payer: Medicare HMO | Source: Ambulatory Visit

## 2020-06-11 ENCOUNTER — Other Ambulatory Visit: Payer: Self-pay | Admitting: Family

## 2020-06-11 ENCOUNTER — Other Ambulatory Visit: Payer: Self-pay

## 2020-06-11 ENCOUNTER — Ambulatory Visit
Admission: RE | Admit: 2020-06-11 | Discharge: 2020-06-11 | Disposition: A | Discharge status: Home / Self Care | Payer: Medicare HMO | Source: Ambulatory Visit | Attending: Family | Admitting: Family

## 2020-06-11 DIAGNOSIS — N6489 Other specified disorders of breast: Secondary | ICD-10-CM

## 2020-06-16 ENCOUNTER — Ambulatory Visit (INDEPENDENT_AMBULATORY_CARE_PROVIDER_SITE_OTHER): Payer: Medicare HMO | Admitting: Orthotics

## 2020-06-16 ENCOUNTER — Other Ambulatory Visit: Payer: Self-pay

## 2020-06-16 DIAGNOSIS — L84 Corns and callosities: Secondary | ICD-10-CM

## 2020-06-16 DIAGNOSIS — L97509 Non-pressure chronic ulcer of other part of unspecified foot with unspecified severity: Secondary | ICD-10-CM

## 2020-06-16 DIAGNOSIS — L97524 Non-pressure chronic ulcer of other part of left foot with necrosis of bone: Secondary | ICD-10-CM | POA: Diagnosis not present

## 2020-06-16 DIAGNOSIS — E1165 Type 2 diabetes mellitus with hyperglycemia: Secondary | ICD-10-CM

## 2020-06-16 DIAGNOSIS — E1140 Type 2 diabetes mellitus with diabetic neuropathy, unspecified: Secondary | ICD-10-CM

## 2020-06-16 DIAGNOSIS — IMO0002 Reserved for concepts with insufficient information to code with codable children: Secondary | ICD-10-CM

## 2020-06-16 NOTE — Progress Notes (Signed)

## 2020-07-13 ENCOUNTER — Ambulatory Visit: Payer: Medicare HMO | Admitting: Podiatry

## 2020-07-27 ENCOUNTER — Other Ambulatory Visit: Payer: Self-pay

## 2020-07-27 ENCOUNTER — Ambulatory Visit: Payer: Medicare HMO | Admitting: Podiatry

## 2020-07-29 ENCOUNTER — Encounter: Payer: Self-pay | Admitting: Internal Medicine

## 2020-07-29 ENCOUNTER — Ambulatory Visit (INDEPENDENT_AMBULATORY_CARE_PROVIDER_SITE_OTHER): Payer: Medicare HMO | Admitting: Internal Medicine

## 2020-07-29 ENCOUNTER — Other Ambulatory Visit: Payer: Self-pay

## 2020-07-29 VITALS — BP 120/78 | HR 82 | Ht 66.0 in | Wt 249.5 lb

## 2020-07-29 DIAGNOSIS — Z794 Long term (current) use of insulin: Secondary | ICD-10-CM

## 2020-07-29 DIAGNOSIS — E1165 Type 2 diabetes mellitus with hyperglycemia: Secondary | ICD-10-CM

## 2020-07-29 DIAGNOSIS — IMO0002 Reserved for concepts with insufficient information to code with codable children: Secondary | ICD-10-CM

## 2020-07-29 DIAGNOSIS — E11319 Type 2 diabetes mellitus with unspecified diabetic retinopathy without macular edema: Secondary | ICD-10-CM | POA: Diagnosis not present

## 2020-07-29 DIAGNOSIS — E1159 Type 2 diabetes mellitus with other circulatory complications: Secondary | ICD-10-CM | POA: Diagnosis not present

## 2020-07-29 DIAGNOSIS — E1142 Type 2 diabetes mellitus with diabetic polyneuropathy: Secondary | ICD-10-CM | POA: Diagnosis not present

## 2020-07-29 DIAGNOSIS — E1140 Type 2 diabetes mellitus with diabetic neuropathy, unspecified: Secondary | ICD-10-CM

## 2020-07-29 LAB — BASIC METABOLIC PANEL
BUN: 18 mg/dL (ref 6–23)
CO2: 29 mEq/L (ref 19–32)
Calcium: 9.7 mg/dL (ref 8.4–10.5)
Chloride: 102 mEq/L (ref 96–112)
Creatinine, Ser: 1.13 mg/dL (ref 0.40–1.20)
GFR: 52.48 mL/min — ABNORMAL LOW (ref >60.00)
Glucose, Bld: 131 mg/dL — ABNORMAL HIGH (ref 70–99)
Potassium: 4 mEq/L (ref 3.5–5.1)
Sodium: 138 mEq/L (ref 135–145)

## 2020-07-29 LAB — MICROALBUMIN / CREATININE URINE RATIO
Creatinine,U: 79.1 mg/dL
Microalb Creat Ratio: 0.9 mg/g (ref 0.0–30.0)
Microalb, Ur: <0.7 mg/dL (ref 0.0–1.9)

## 2020-07-29 LAB — POCT GLYCOSYLATED HEMOGLOBIN (HGB A1C): Hemoglobin A1C: 9.2 % — AB (ref 4.0–5.6)

## 2020-07-29 LAB — POCT GLUCOSE (DEVICE FOR HOME USE): Glucose Fasting, POC: 145 mg/dL — AB (ref 70–99)

## 2020-07-29 MED ORDER — NOVOLOG FLEXPEN 100 UNIT/ML ~~LOC~~ SOPN: 14.0000 [IU] | PEN_INJECTOR | Freq: Three times a day (TID) | SUBCUTANEOUS | 3 refills | Status: DC

## 2020-07-29 MED ORDER — EMPAGLIFLOZIN 25 MG PO TABS: 25.0000 mg | ORAL_TABLET | Freq: Every day | ORAL | 6 refills | Status: DC

## 2020-07-29 NOTE — Patient Instructions (Addendum)
-   Continue Tresiba 20 units daily  - Increase Novolog to 14 units with each meal  - Increase  Jardiance to 25 mg daily - Continue Metformin 1000 mg twice daily  - Continue Trulicity 1.5 mg ONCE WEEKLY    HOW TO TREAT LOW BLOOD SUGARS (Blood sugar LESS THAN 70 MG/DL)  Please follow the RULE OF 15 for the treatment of hypoglycemia treatment (when your (blood sugars are less than 70 mg/dL)    STEP 1: Take 15 grams of carbohydrates when your blood sugar is low, which includes:   3-4 GLUCOSE TABS  OR  3-4 OZ OF JUICE OR REGULAR SODA OR  ONE TUBE OF GLUCOSE GEL     STEP 2: RECHECK blood sugar in 15 MINUTES STEP 3: If your blood sugar is still low at the 15 minute recheck --> then, go back to STEP 1 and treat AGAIN with another 15 grams of carbohydrates.

## 2020-07-29 NOTE — Progress Notes (Signed)
Name: CHAE [name]  Age/ Sex: 60 y.o., female   MRN/ DOB: [id number], Jun 14, 1961     PCP: Sonia Side., FNP   Reason for Endocrinology Evaluation: Type 2 Diabetes Mellitus  Initial Endocrine Consultative Visit: 10/07/2019    PATIENT IDENTIFIER: [name] is a 60 y.o. female with a past medical history of HTn and T2DM. The patient has followed with Endocrinology clinic since 10/07/2019 for consultative assistance with management of her diabetes.  DIABETIC HISTORY:  [name] was diagnosed with gestational diabetes in 1984, requiring insulin , she was off insulin until her next pregnancy in 1985 and has been on insulin ever since. Her hemoglobin A1c has ranged from 8.1% in 2016, peaking at 12.5% in 2020.   On her initial visit to our clinic, she had an A1c of 9.6%. She was on MDI regimen as well as victoza and metformin, which were continued and added jardiance.     Switched victoza to trulicity 09/979 SUBJECTIVE:   During the last visit (01/13/2020): A1c 9.3 % , she continued to take sub-optimal insulin doses then prescribed. We decreased tresiba, increased Novolog and continued Metformin and Jardiance.  We switched Victoza to Trulicity     Today (07/05/1476): [name] is here for a follow up on diabetes management.  She checks her blood sugars multiple times daily through CGM. She has been taking less insulin than prescribed, she does admit to taking prandial insulin after meals at times   Eats lunch and supper   Denies nausea or diarrhea   HOME DIABETES REGIMEN:  Tresiba  20 units daily  Novolog 6 units with each meal- taking 12 units ? Jardiance 10 mg daily  Metformin 1000 mg twice daily  Trulicity 1.5 mg weekly    Statin: yes ACE-I/ARB: yes     CONTINUOUS GLUCOSE MONITORING RECORD INTERPRETATION Forgot receiver      Microvascular complications:   Neuropathy, hx of left foot ulcer, retinopathy   Denies: CKD   Last eye exam: Completed  06/2019  Macrovascular complications:   CAD, CHF  Denies:  PVD, CVA     HISTORY:  Past Medical History:  Past Medical History:  Diagnosis Date  . Adhesive capsulitis of shoulder    bilateral, Steroid injection Dr. Truman Hayward 1/12 bilaterally  . CAD (coronary artery disease)    nonobstructive. Last cardiac cath (2008) showing left circumflex with mid 50% stenosis and distal luminal irregularities. Also with RCA with mid to distal 30-40% stenosis. // Previously evaluated by [facility], never followed up outpatient.  Marland Kitchen CAP (community acquired pneumonia) 06/15/2012   05/2012 CXR: Mild opacification of the posterior lung base on the lateral film  as cannot exclude infection/atelectasis.    . CHF (congestive heart failure) ([place])   . Diabetes mellitus 2007   Type II, insulin dependent  . Diabetic retinopathy   . GERD (gastroesophageal reflux disease)   . Glaucoma   . Hyperlipidemia   . Hypertension   . Obesity   . Peripheral neuropathy    Past Surgical History:  Past Surgical History:  Procedure Laterality Date  . ANTERIOR CERVICAL DECOMP/DISCECTOMY FUSION N/A 06/16/2016   Procedure: Anterior Cervical Decompression/discectomy Fusion - Cervical six - Cervical seven;  Surgeon: Eustace Moore, MD;  Location: [place] [facility];  Service: Neurosurgery;  Laterality: N/A;  Anterior Cervical Decompression/discectomy Fusion - Cervical six - Cervical seven  . APPENDECTOMY    . CARDIAC CATHETERIZATION    . CATARACT EXTRACTION    . GLAUCOMA SURGERY    .  LEFT HEART CATH AND CORONARY ANGIOGRAPHY N/A 05/22/2018   Procedure: LEFT HEART CATH AND CORONARY ANGIOGRAPHY;  Surgeon: Troy Sine, MD;  Location: [facility];  Service: Cardiovascular;  Laterality: N/A;  . LEFT HEART CATHETERIZATION WITH CORONARY ANGIOGRAM N/A 01/24/2012   Procedure: LEFT HEART CATHETERIZATION WITH CORONARY ANGIOGRAM;  Surgeon: Sherren Mocha, MD;  Location: [facility] CATH LAB;  Service: Cardiovascular;  Laterality: N/A;  .  POSTERIOR CERVICAL FUSION/FORAMINOTOMY N/A 10/22/2015   Procedure: Cervical three-Cervical Seven Posterior cervical fusion with lateral mass fixation,  Laminectomy Cervical Three-Cervical Seven;  Surgeon: Eustace Moore, MD;  Location: [facility]S;  Service: Neurosurgery;  Laterality: N/A;  Posterior  . TUBAL LIGATION      Social History:  reports that she quit smoking about 14 years ago. Her smoking use included cigarettes. She has a 2.10 pack-year smoking history. She has never used smokeless tobacco. She reports that she does not drink alcohol and does not use drugs. Family History:  Family History  Problem Relation Age of Onset  . Heart disease Mother   . Hypertension Sister   . Hypertension Sister   . Hypertension Sister   . Heart attack Neg Hx   . Stroke Neg Hx      HOME MEDICATIONS: Allergies as of 07/29/2020   No Known Allergies     Medication List       Accurate as of July 29, 2020  9:53 AM. If you have any questions, ask your nurse or doctor.        amitriptyline 25 MG tablet Commonly known as: ELAVIL Take 1 tablet (25 mg total) by mouth at bedtime.   aspirin 81 MG EC tablet Take 1 tablet (81 mg total) by mouth daily.   atorvastatin 80 MG tablet Commonly known as: LIPITOR Take 1 tablet (80 mg total) by mouth daily at 6 PM.   clopidogrel 75 MG tablet Commonly known as: PLAVIX Take 1 tablet (75 mg total) by mouth daily.   Combigan 0.2-0.5 % ophthalmic solution Generic drug: brimonidine-timolol Place 1 drop into both eyes 2 (two) times daily.   cyclobenzaprine 10 MG tablet Commonly known as: FLEXERIL TAKE 1 TABLET(10 MG) BY MOUTH TWICE DAILY AS NEEDED FOR MUSCLE SPASMS   diclofenac sodium 1 % Gel Commonly known as: VOLTAREN Apply 2 g topically 3 (three) times daily as needed.   diphenhydrAMINE 25 MG tablet Commonly known as: BENADRYL Take 1 tablet (25 mg total) by mouth every 6 (six) hours as needed for itching or allergies.   dorzolamide 2 %  ophthalmic solution Commonly known as: TRUSOPT Place 1 drop into both eyes 2 (two) times daily.   empagliflozin 10 MG Tabs tablet Commonly known as: Jardiance Take 1 tablet (10 mg total) by mouth daily before breakfast.   fluticasone 50 MCG/ACT nasal spray Commonly known as: Flonase Place 1 spray into both nostrils daily.   FreeStyle Libre 14 Day Reader Kerrin Mo 1 each by Does not apply route 4 (four) times daily.   FreeStyle Libre 14 Day Sensor Misc 1 each by Does not apply route QID.   furosemide 20 MG tablet Commonly known as: LASIX Take 20 mg by mouth 2 (two) times daily.   gabapentin 300 MG capsule Commonly known as: NEURONTIN Take 300 mg by mouth 2 (two) times daily.   glucose blood test strip Commonly known as: OneTouch Verio USE TO CHECK BLOOD SUGAR 4 TIMES DAILY AS DIRECTED   Insulin Pen Needle 32G X 4 MM Misc Use to  inject insulin up to 4 times daily   Insulin Pen Needle 32G X 4 MM Misc 1 Device by Does not apply route in the morning, at noon, in the evening, and at bedtime.   IRON PO Take 1 tablet by mouth 3 (three) times daily.   isosorbide mononitrate 60 MG 24 hr tablet Commonly known as: IMDUR Take 1 tablet (60 mg total) by mouth daily.   lisinopril 2.5 MG tablet Commonly known as: ZESTRIL TAKE 1 TABLET(2.5 MG) BY MOUTH DAILY   metFORMIN 1000 MG tablet Commonly known as: GLUCOPHAGE TAKE 1 TABLET BY MOUTH TWICE DAILY WITH A MEAL   metoprolol tartrate 50 MG tablet Commonly known as: LOPRESSOR Take 1.5 tablets (75 mg total) by mouth 2 (two) times daily.   nitroGLYCERIN 0.4 MG SL tablet Commonly known as: NITROSTAT Place 1 tablet (0.4 mg total) under the tongue every 5 (five) minutes as needed for chest pain.   NovoLOG FlexPen 100 UNIT/ML FlexPen Generic drug: insulin aspart Inject 6 Units into the skin 3 (three) times daily with meals.   OneTouch Delica Lancets Fine Misc Check blood sugar as instructed up to 3 times a day    oxyCODONE-acetaminophen 5-325 MG tablet Commonly known as: PERCOCET/ROXICET Take 1 tablet by mouth every 8 (eight) hours as needed for severe pain.   pantoprazole 40 MG tablet Commonly known as: PROTONIX Take 1 tablet (40 mg total) by mouth daily.   ranolazine 500 MG 12 hr tablet Commonly known as: RANEXA Take 1 tablet (500 mg total) by mouth 2 (two) times daily.   Santyl ointment Generic drug: collagenase Apply 1 application topically daily as needed (foot).   Tyler Aas FlexTouch 100 UNIT/ML FlexTouch Pen Generic drug: insulin degludec Inject 20 Units into the skin daily.   triamcinolone 0.1 % Commonly known as: KENALOG Apply 1 application topically as needed.   Trulicity 1.5 DQ/2.2WL Sopn Generic drug: Dulaglutide Inject 0.5 mLs (1.5 mg total) into the skin once a week.        OBJECTIVE:   Vital Signs: BP 120/78   Pulse 82   Ht 5\' 6"  (1.676 m)   Wt 249 lb 8 oz (113.2 kg)   LMP 02/13/2011   SpO2 100%   BMI 40.27 kg/m   Wt Readings from Last 3 Encounters:  07/29/20 249 lb 8 oz (113.2 kg)  03/17/20 254 lb (115.2 kg)  01/13/20 262 lb 12.8 oz (119.2 kg)     Exam: General: Pt appears well and is in NAD  Lungs: Clear with good BS bilat with no rales, rhonchi, or wheezes  Heart: RRR with normal S1 and S2 and no gallops; no murmurs; no rub  Extremities: 1+  pretibial edema.   Neuro: MS is good with appropriate affect, pt is alert and Ox3      DM foot exam: 10/07/2019  The skin of the feet iswithout sores or ulcerations. The pedal pulses are undetectable The sensation is decreased  to a screening 5.07, 10 gram monofilament bilaterally       DATA REVIEWED:  Lab Results  Component Value Date   HGBA1C 9.2 (A) 07/29/2020   HGBA1C 9.6 (A) 10/07/2019   HGBA1C 10.1 (A) 02/21/2018   Lab Results  Component Value Date   MICROALBUR 2.35 (H) 03/08/2013   LDLCALC 48 07/17/2018   CREATININE 1.12 10/07/2019   Lab Results  Component Value Date   MICRALBCREAT  14.7 03/08/2013     Lab Results  Component Value Date   CHOL 116 07/17/2018  HDL 51 07/17/2018   LDLCALC 48 07/17/2018   TRIG 83 07/17/2018   CHOLHDL 2.3 07/17/2018       Results for NITIKA, WANNINGER (MRN [id number]) as of 07/29/2020 15:38  Ref. Range 07/29/2020 10:12  Sodium Latest Ref Range: 135 - 145 mEq/L 138  Potassium Latest Ref Range: 3.5 - 5.1 mEq/L 4.0  Chloride Latest Ref Range: 96 - 112 mEq/L 102  CO2 Latest Ref Range: 19 - 32 mEq/L 29  Glucose Latest Ref Range: 70 - 99 mg/dL 131 (H)  BUN Latest Ref Range: 6 - 23 mg/dL 18  Creatinine Latest Ref Range: 0.40 - 1.20 mg/dL 1.13  Calcium Latest Ref Range: 8.4 - 10.5 mg/dL 9.7  GFR Latest Ref Range: >60.00 mL/min 52.48 (L)  MICROALB/CREAT RATIO Latest Ref Range: 0.0 - 30.0 mg/g 0.9  Creatinine,U Latest Units: mg/dL 79.1  Microalb, Ur Latest Ref Range: 0.0 - 1.9 mg/dL <0.7     ASSESSMENT / PLAN / RECOMMENDATIONS:   1) Type 2 Diabetes Mellitus, Poorly controlled, With retinopathic,  neuropathic, and macrovascular  complications complications - Most recent A1c of 9.3 %. Goal A1c < 7.0 %.     - She forgot her CGM receiver today and we unable to download data, hence limited information  - A1c continues to be above goal with slight improvement - ON her last visit ( 08/2019) she told me she was taking 4 units of Novolog so we increased it to 6 units but today she tells me she has been taking 12 units  - Advised pt to avoid snacks or choose low carb options - Will make the following adjustments - She is happy with switching victoza to Trulicity   MEDICATIONS: - Continue Tresiba 20 units daily  - Increase Novolog to 14 units with each meal  - Increase  Jardiance to 25 mg daily - Continue Metformin 1000 mg twice daily  - Continue Trulicity 1.5 mg ONCE WEEKLY    EDUCATION / INSTRUCTIONS:  BG monitoring instructions: Patient is instructed to check her blood sugars 4 times a day, before meals and bedtime   Call [facility] clinic if: BG persistently < 70  . I reviewed the Rule of 15 for the treatment of hypoglycemia in detail with the patient. Literature supplied.    2) Diabetic complications:   Eye: Does have known diabetic retinopathy.   Neuro/ Feet: Does have known diabetic peripheral neuropathy .   Renal: Patient does not have known baseline CKD. She   is on an ACEI/ARB at present.    F/U in 4 months    Signed electronically by: Mack Guise, MD  [name]  [address] [place], [place] [address] Phone: [phone] FAX: [phone]   CC: [address] [place] [address] Phone: [phone]  Fax: [phone]  Return to Endocrinology clinic as below: Future Appointments  Date Time Provider [place]  09/17/2020  8:20 AM Sherren Mocha, MD [facility]

## 2020-08-31 ENCOUNTER — Telehealth: Payer: Self-pay | Admitting: Cardiovascular Disease

## 2020-08-31 NOTE — Telephone Encounter (Signed)
Pt c/o BP issue: STAT if pt c/o blurred vision, one-sided weakness or slurred speech  1. What are your last 5 BP readings? Pt reports 80/40 not today, hard to determine the frequency her BP drops  2. Are you having any other symptoms (ex. Dizziness, headache, blurred vision, passed out)? Difficulty even standing up right when BP drops  3. What is your BP issue? [name] from [employer] states he spoke with the patient who reports her BP has gotten as low as 80/40. He states this was not today, but when her BP gets low she has difficulty standing up right. He states it is hard to determine the frequency her BP drops very low. He states she also states she believes it may be related to her Ranolazine. He states they have not filled this medication in 10 months. He states is is very concerned for the patient and that her BP is critically low. He would like a nurse to call the patient back directly.

## 2020-08-31 NOTE — Telephone Encounter (Signed)
Called patient who states she received a call from the pharmacist at [facility] this morning and that she was telling him that her BP is sometimes low. She states she has an appointment with Dr. Burt Knack later this month and that she can wait until then. She thought that Ranexa was causing the drop in BP but I advised that it does not cause hypotension. States she realized that she was taking too much isosorbide at times because she would forget it and then take a whole tablet instead of a half. I advised her to continue to monitor BP and to hold isosorbide if BP is low. She states this does not occur often and that she is feeling fine today. She verbalized understanding and agreement with plan and will be more aware of taking too much isosorbide. She thanked me for the call.

## 2020-09-17 ENCOUNTER — Encounter: Payer: Self-pay | Admitting: Cardiovascular Disease

## 2020-09-17 ENCOUNTER — Other Ambulatory Visit: Payer: Self-pay

## 2020-09-17 ENCOUNTER — Ambulatory Visit (INDEPENDENT_AMBULATORY_CARE_PROVIDER_SITE_OTHER): Payer: Medicare HMO | Admitting: Cardiovascular Disease

## 2020-09-17 VITALS — BP 118/72 | HR 84 | Ht 66.0 in | Wt 245.4 lb

## 2020-09-17 DIAGNOSIS — I5022 Chronic systolic (congestive) heart failure: Secondary | ICD-10-CM | POA: Diagnosis not present

## 2020-09-17 DIAGNOSIS — E782 Mixed hyperlipidemia: Secondary | ICD-10-CM | POA: Diagnosis not present

## 2020-09-17 DIAGNOSIS — E1165 Type 2 diabetes mellitus with hyperglycemia: Secondary | ICD-10-CM

## 2020-09-17 DIAGNOSIS — I25119 Atherosclerotic heart disease of native coronary artery with unspecified angina pectoris: Secondary | ICD-10-CM

## 2020-09-17 DIAGNOSIS — I10 Essential (primary) hypertension: Secondary | ICD-10-CM

## 2020-09-17 DIAGNOSIS — E1140 Type 2 diabetes mellitus with diabetic neuropathy, unspecified: Secondary | ICD-10-CM | POA: Diagnosis not present

## 2020-09-17 DIAGNOSIS — IMO0002 Reserved for concepts with insufficient information to code with codable children: Secondary | ICD-10-CM

## 2020-09-17 LAB — HEPATIC FUNCTION PANEL
ALT: 16 IU/L (ref 0–32)
AST: 21 IU/L (ref 0–40)
Albumin: 4.3 g/dL (ref 3.8–4.8)
Alkaline Phosphatase: 109 IU/L (ref 44–121)
Bilirubin Total: 0.2 mg/dL (ref 0.0–1.2)
Bilirubin, Direct: <0.1 mg/dL (ref 0.00–0.40)
Total Protein: 7.3 g/dL (ref 6.0–8.5)

## 2020-09-17 LAB — LIPID PANEL
Chol/HDL Ratio: 2.7 ratio (ref 0.0–4.4)
Cholesterol, Total: 145 mg/dL (ref 100–199)
HDL: 53 mg/dL (ref >39)
LDL Chol Calc (NIH): 70 mg/dL (ref 0–99)
Triglycerides: 126 mg/dL (ref 0–149)
VLDL Cholesterol Cal: 22 mg/dL (ref 5–40)

## 2020-09-17 NOTE — Patient Instructions (Signed)
Medication Instructions:  1) STOP ASPIRIN *If you need a refill on your cardiac medications before your next appointment, please call your pharmacy*  Lab Work: TODAY!  If you have labs (blood work) drawn today and your tests are completely normal, you will receive your results only by: Marland Kitchen MyChart Message (if you have MyChart) OR . A paper copy in the mail If you have any lab test that is abnormal or we need to change your treatment, we will call you to review the results.  Follow-Up: At [facility], you and your health needs are our priority.  As part of our continuing mission to provide you with exceptional heart care, we have created designated Provider Care Teams.  These Care Teams include your primary Cardiologist (physician) and Advanced Practice Providers (APPs -  Physician Assistants and Nurse Practitioners) who all work together to provide you with the care you need, when you need it. Your next appointment:   12 month(s) The format for your next appointment:   In Person Provider:   You may see Sherren Mocha, MD or one of the following Advanced Practice Providers on your designated Care Team:    Richardson Dopp, PA-C  [place], [place]

## 2020-09-17 NOTE — Progress Notes (Signed)
Cardiology Office Note:    Date:  09/17/2020   ID:  [name], DOB Jul 11, 1960, MRN [id number]  PCP:  Sonia Side., [facility]  Cardiologist:  Sherren Mocha, MD  Advanced Practice Provider:  No care team member to display Electrophysiologist:  None       Referring MD: Sonia Side., FNP   Chief Complaint  Patient presents with  . Coronary Artery Disease    History of Present Illness:    [name] is a 60 y.o. female with a hx of:  Coronary artery disease  ? NSTEMI 04/2018 >> Cath w/ diffuse disease >> no targets for CABG >> Med Rx  Cath: pLAD 50, mLAD 80, dLAD 70; oD1 95, mLCx 90, dLCx 80, OM2 95, mRCA 30, dRCA 30  PCI could be considered if medical Rx failed  Chronic Systolic CHF ? Echocardiogram 7/13: EF 35-40 ? Echocardiogram 11/16: EF 55-60 ? Echocardiogram 8/18: EF 50-55 ? Echocardiogram 11/19: EF 45-50 ? Non-ischemic cardiomyopathy (CM out of proportion to extensive CAD)   Morbid obesity   Diabetes mellitus   Hypertension   Hyperlipidemia   Lung nodule  The patient is here alone today.  She is doing well at present.  She has been going to the [employer], [facility] and exercising.  She denies symptoms of chest pressure or shortness of breath.  No orthopnea or PND.  She occasionally has mild leg swelling.  No lightheadedness or syncope.  She is compliant with her medications.  She is now following with primary care at [facility].  She is working on bringing her blood sugars down.  Her most recent hemoglobin A1c is 9.2.  Past Medical History:  Diagnosis Date  . Adhesive capsulitis of shoulder    bilateral, Steroid injection Dr. Truman Hayward 1/12 bilaterally  . CAD (coronary artery disease)    nonobstructive. Last cardiac cath (2008) showing left circumflex with mid 50% stenosis and distal luminal irregularities. Also with RCA with mid to distal 30-40% stenosis. // Previously evaluated by [facility], never  followed up outpatient.  Marland Kitchen CAP (community acquired pneumonia) 06/15/2012   05/2012 CXR: Mild opacification of the posterior lung base on the lateral film  as cannot exclude infection/atelectasis.    . CHF (congestive heart failure) ([place])   . Diabetes mellitus 2007   Type II, insulin dependent  . Diabetic retinopathy   . GERD (gastroesophageal reflux disease)   . Glaucoma   . Hyperlipidemia   . Hypertension   . Obesity   . Peripheral neuropathy     Past Surgical History:  Procedure Laterality Date  . ANTERIOR CERVICAL DECOMP/DISCECTOMY FUSION N/A 06/16/2016   Procedure: Anterior Cervical Decompression/discectomy Fusion - Cervical six - Cervical seven;  Surgeon: Eustace Moore, MD;  Location: [place];  Service: Neurosurgery;  Laterality: N/A;  Anterior Cervical Decompression/discectomy Fusion - Cervical six - Cervical seven  . APPENDECTOMY    . CARDIAC CATHETERIZATION    . CATARACT EXTRACTION    . GLAUCOMA SURGERY    . LEFT HEART CATH AND CORONARY ANGIOGRAPHY N/A 05/22/2018   Procedure: LEFT HEART CATH AND CORONARY ANGIOGRAPHY;  Surgeon: Troy Sine, MD;  Location: [place] CV [facility];  Service: Cardiovascular;  Laterality: N/A;  . LEFT HEART CATHETERIZATION WITH CORONARY ANGIOGRAM N/A 01/24/2012   Procedure: LEFT HEART CATHETERIZATION WITH CORONARY ANGIOGRAM;  Surgeon: Sherren Mocha, MD;  Location: [facility] CATH LAB;  Service: Cardiovascular;  Laterality: N/A;  . POSTERIOR  CERVICAL FUSION/FORAMINOTOMY N/A 10/22/2015   Procedure: Cervical three-Cervical Seven Posterior cervical fusion with lateral mass fixation,  Laminectomy Cervical Three-Cervical Seven;  Surgeon: Eustace Moore, MD;  Location: [place] NEURO ORS;  Service: Neurosurgery;  Laterality: N/A;  Posterior  . TUBAL LIGATION      Current Medications: Current Meds  Medication Sig  . amitriptyline (ELAVIL) 25 MG tablet Take 1 tablet (25 mg total) by mouth at bedtime.  Marland Kitchen atorvastatin (LIPITOR) 80 MG tablet Take 1 tablet (80 mg total) by  mouth daily at 6 PM.  . clopidogrel (PLAVIX) 75 MG tablet Take 1 tablet (75 mg total) by mouth daily.  . COMBIGAN 0.2-0.5 % ophthalmic solution Place 1 drop into both eyes 2 (two) times daily.  . Continuous Blood Gluc Receiver (FREESTYLE LIBRE 14 DAY READER) DEVI 1 each by Does not apply route 4 (four) times daily.  . Continuous Blood Gluc Sensor (FREESTYLE LIBRE 14 DAY SENSOR) MISC 1 each by Does not apply route QID.  Marland Kitchen cyclobenzaprine (FLEXERIL) 10 MG tablet TAKE 1 TABLET(10 MG) BY MOUTH TWICE DAILY AS NEEDED FOR MUSCLE SPASMS  . diclofenac sodium (VOLTAREN) 1 % GEL Apply 2 g topically 3 (three) times daily as needed.  . diphenhydrAMINE (BENADRYL) 25 MG tablet Take 1 tablet (25 mg total) by mouth every 6 (six) hours as needed for itching or allergies.  Marland Kitchen dorzolamide (TRUSOPT) 2 % ophthalmic solution Place 1 drop into both eyes 2 (two) times daily.  . Dulaglutide (TRULICITY) 1.5 HB/7.1IR SOPN Inject 0.5 mLs (1.5 mg total) into the skin once a week.  . empagliflozin (JARDIANCE) 25 MG TABS tablet Take 1 tablet (25 mg total) by mouth daily before breakfast.  . furosemide (LASIX) 20 MG tablet Take 20 mg by mouth 2 (two) times daily.  Marland Kitchen gabapentin (NEURONTIN) 300 MG capsule Take 300 mg by mouth 2 (two) times daily.  Marland Kitchen glucose blood (ONETOUCH VERIO) test strip USE TO CHECK BLOOD SUGAR 4 TIMES DAILY AS DIRECTED  . insulin aspart (NOVOLOG FLEXPEN) 100 UNIT/ML FlexPen Inject 14 Units into the skin 3 (three) times daily with meals.  . insulin degludec (TRESIBA FLEXTOUCH) 100 UNIT/ML FlexTouch Pen Inject 20 Units into the skin daily.  . Insulin Pen Needle 32G X 4 MM MISC Use to inject insulin up to 4 times daily  . Insulin Pen Needle 32G X 4 MM MISC 1 Device by Does not apply route in the morning, at noon, in the evening, and at bedtime.  . IRON PO Take 1 tablet by mouth 3 (three) times daily.  . isosorbide mononitrate (IMDUR) 60 MG 24 hr tablet Take 1 tablet (60 mg total) by mouth daily.  Marland Kitchen lisinopril  (ZESTRIL) 2.5 MG tablet TAKE 1 TABLET(2.5 MG) BY MOUTH DAILY  . metFORMIN (GLUCOPHAGE) 1000 MG tablet TAKE 1 TABLET BY MOUTH TWICE DAILY WITH A MEAL  . metoprolol tartrate (LOPRESSOR) 50 MG tablet Take 1.5 tablets (75 mg total) by mouth 2 (two) times daily.  . nitroGLYCERIN (NITROSTAT) 0.4 MG SL tablet Place 1 tablet (0.4 mg total) under the tongue every 5 (five) minutes as needed for chest pain.  Glory Rosebush DELICA LANCETS FINE MISC Check blood sugar as instructed up to 3 times a day  . oxyCODONE-acetaminophen (PERCOCET/ROXICET) 5-325 MG tablet Take 1 tablet by mouth every 8 (eight) hours as needed for severe pain.  . pantoprazole (PROTONIX) 40 MG tablet Take 1 tablet (40 mg total) by mouth daily.  Marland Kitchen SANTYL ointment Apply 1 application topically daily as needed (  foot).   . triamcinolone cream (KENALOG) 0.1 % Apply 1 application topically as needed.  . [DISCONTINUED] aspirin EC 81 MG EC tablet Take 1 tablet (81 mg total) by mouth daily.     Allergies:   Patient has no known allergies.   Social History   Socioeconomic History  . Marital status: Single    Spouse name: Not on file  . Number of children: 2  . Years of education: 12th grade  . Highest education level: Not on file  Occupational History  . Occupation: Development worker, community: UNEMPLOYED    Comment: used to work in [employer] fast streamer at [employer]). Got disability in 2007.   Tobacco Use  . Smoking status: Former Smoker    Packs/day: 0.30    Years: 7.00    Pack years: 2.10    Types: Cigarettes    Quit date: 06/27/2006    Years since quitting: 14.2  . Smokeless tobacco: Never Used  Vaping Use  . Vaping Use: Never used  Substance and Sexual Activity  . Alcohol use: No    Alcohol/week: 0.0 standard drinks  . Drug use: No  . Sexual activity: Not Currently  Other Topics Concern  . Not on file  Social History Narrative   Lives with her kids, 69yo daughter and 36 yo adopted son.   Social Determinants of Health    Financial Resource Strain: Not on file  Food Insecurity: Not on file  Transportation Needs: Not on file  Physical Activity: Not on file  Stress: Not on file  Social Connections: Not on file     Family History: The patient's family history includes Heart disease in her mother; Hypertension in her sister, sister, and sister. There is no history of Heart attack or Stroke.  ROS:   Please see the history of present illness.    All other systems reviewed and are negative.  EKGs/Labs/Other Studies Reviewed:    The following studies were reviewed today: Cardiac catheterization films from2019 are personally reviewed with the patient today.  Conclusion    Ost 1st Diag to 1st Diag lesion is 95% stenosed.  Prox LAD to Mid LAD lesion is 50% stenosed.  Mid LAD lesion is 80% stenosed.  Dist LAD lesion is 70% stenosed.  2nd Mrg lesion is 95% stenosed.  Mid Cx lesion is 90% stenosed.  Dist Cx lesion is 80% stenosed.  Mid RCA lesion is 30% stenosed.  Dist RCA lesion is 30% stenosed.  LV end diastolic pressure is mildly elevated.  The left ventricular ejection fraction is 25-35% by visual estimate.   Severe multivessel CAD with mild calcification of the proximal LAD, long diffuse 90% stenosis in the first diagonal vessel of the LAD, 50% proximal -mid, 80% diffuse mid LAD stenosis and 70% distal LAD stenoses; circumflex vessel with a large second marginal branch with focal 95% stenosis in the distal portion of this marginal vessel and 90 and 80% mid distal AV groove circumflex stenoses; and mild segmental 30% narrowing before and after the acute margin in the RCA.  New severe LV dysfunction with an EF of 25 to 30% with diffuse hypocontractility involving the mid distal anterolateral wall apex, and apical distal inferior wall and LVEDP 19 mm  RECOMMENDATION: Angiograms were reviewed with colleagues.  In comparison to the prior study of 2013, there is now significant progression  of CAD with diffuse stenoses in the diagonal vessel mid distal LAD, distal circumflex marginal, mid distal AV groove circumflex vessels.  Due to the distal disease CABG revascularization is not favorable.  The patient will be gently hydrated post procedure.  She will be started on aggressive medical management initially and if medical therapy fails consider staged PCI.  The patient will be started on carvedilol, nitrates, consider amlodipine and possible ranolazine.  If she is not felt to be an operative candidate dual antiplatelet therapy will be beneficial.  2D Echo 05/23/2018: Study Conclusions   - Left ventricle: The cavity size was normal. There was mild  concentric hypertrophy. Systolic function was mildly reduced. The  estimated ejection fraction was in the range of 45% to 50%.  Diffuse hypokinesis. Although no diagnostic regional wall motion  abnormality was identified, this possibility cannot be completely  excluded on the basis of this study. The study is not technically  sufficient to allow evaluation of LV diastolic function.  - Aortic valve: There was no significant regurgitation.  - Mitral valve: There was no significant regurgitation.  - Right ventricle: Systolic function was normal.  - Atrial septum: No defect or patent foramen ovale was identified.   Impressions:   - Technically difficult study, even with administration of echo  contrast (definity). LV EF appears visually mildly reduced, with  global mild hypokinesis. Reduced sensitivity for detection of  focal wall motion abnormalities. No significant valvular disease  appreciated.   EKG:  EKG is not ordered today.  Recent Labs: 07/29/2020: BUN 18; Creatinine, Ser 1.13; Potassium 4.0; Sodium 138  Recent Lipid Panel    Component Value Date/Time   CHOL 116 07/17/2018 1105   TRIG 83 07/17/2018 1105   HDL 51 07/17/2018 1105   CHOLHDL 2.3 07/17/2018 1105   CHOLHDL 4.1 05/21/2018 1800   VLDL 28  05/21/2018 1800   LDLCALC 48 07/17/2018 1105     Risk Assessment/Calculations:       Physical Exam:    VS:  BP 118/72   Pulse 84   Ht 5\' 6"  (1.676 m)   Wt 245 lb 6.4 oz (111.3 kg)   LMP 02/13/2011   BMI 39.61 kg/m     Wt Readings from Last 3 Encounters:  09/17/20 245 lb 6.4 oz (111.3 kg)  07/29/20 249 lb 8 oz (113.2 kg)  03/17/20 254 lb (115.2 kg)     GEN:  Well nourished, well developed in no acute distress HEENT: Normal NECK: No JVD; No carotid bruits LYMPHATICS: No lymphadenopathy CARDIAC: RRR, no murmurs, rubs, gallops RESPIRATORY:  Clear to auscultation without rales, wheezing or rhonchi  ABDOMEN: Soft, non-tender, non-distended MUSCULOSKELETAL:  No edema; No deformity  SKIN: Warm and dry NEUROLOGIC:  Alert and oriented x 3 PSYCHIATRIC:  Normal affect   ASSESSMENT:    1. Mixed hyperlipidemia   2. Chronic systolic CHF (congestive heart failure) ([place])   3. Coronary artery disease involving native coronary artery of native heart with angina pectoris ([place])   4. Uncontrolled type 2 diabetes with neuropathy ([place])   5. Essential hypertension    PLAN:    In order of problems listed above:  1. Reviewed last lipids from January 2020.  Overdue for an updated lipid panel.  Will check lipids and LFTs today.  She is treated with atorvastatin 80 mg daily. 2. Appears clinically stable with New York Heart Association functional class II symptoms.  LVEF has improved based on most recent echocardiogram.  No symptoms of volume overload at present.  Treated with furosemide, isosorbide, lisinopril, and metoprolol. 3. No angina at present.  Patient on multidrug therapy.  Advised  to discontinue aspirin as she remains on clopidogrel.  No indication for dual antiplatelet therapy.  Continue ranolazine, isosorbide, and metoprolol. 4. Discussed lifestyle modification.  Most recent hemoglobin A1c 9.2.  Followed closely by primary care. 5. Blood pressure is well controlled on current  medical program.  Labs are reviewed.  Recent creatinine 1.13, potassium 4.0.  Medication Adjustments/Labs and Tests Ordered: Current medicines are reviewed at length with the patient today.  Concerns regarding medicines are outlined above.  Orders Placed This Encounter  Procedures  . Hepatic function panel  . Lipid panel   No orders of the defined types were placed in this encounter.   Patient Instructions  Medication Instructions:  1) STOP ASPIRIN *If you need a refill on your cardiac medications before your next appointment, please call your pharmacy*  Lab Work: TODAY!  If you have labs (blood work) drawn today and your tests are completely normal, you will receive your results only by: Marland Kitchen MyChart Message (if you have MyChart) OR . A paper copy in the mail If you have any lab test that is abnormal or we need to change your treatment, we will call you to review the results.  Follow-Up: At [name], you and your health needs are our priority.  As part of our continuing mission to provide you with exceptional heart care, we have created designated Provider Care Teams.  These Care Teams include your primary Cardiologist (physician) and Advanced Practice Providers (APPs -  Physician Assistants and Nurse Practitioners) who all work together to provide you with the care you need, when you need it. Your next appointment:   12 month(s) The format for your next appointment:   In Person Provider:   You may see Sherren Mocha, MD or one of the following Advanced Practice Providers on your designated Care Team:    Richardson Dopp, PA-C  Robbie Lis, [place]      Signed, Sherren Mocha, MD  09/17/2020 8:52 AM    [place]

## 2020-09-18 ENCOUNTER — Telehealth: Payer: Self-pay

## 2020-09-18 DIAGNOSIS — E782 Mixed hyperlipidemia: Secondary | ICD-10-CM

## 2020-09-18 NOTE — Telephone Encounter (Signed)
-----   Message from Liliane Shi, PA-C sent at 09/18/2020 10:48 AM EDT ----- LDL borderline.  It should be < 70. PLAN:  Continue current medications. Work on diet Repeat fasting Lipids in 6 mos Richardson Dopp, [place]    09/18/2020 10:43 AM

## 2020-09-18 NOTE — Telephone Encounter (Signed)
RN called and discussed that her LDL was borderline, and [name] stated to concurrent medications, watching diet and ordered for her lipids to be rechecked in 6 months. Patients agreed to this plan, and lipids was scheduled for 03/24/21, was told she needed to fast. Patient understood.  Manuela Schwartz RN

## 2020-09-28 ENCOUNTER — Other Ambulatory Visit: Payer: Self-pay | Admitting: Family

## 2020-09-28 ENCOUNTER — Other Ambulatory Visit: Payer: Self-pay | Admitting: Internal Medicine

## 2020-10-01 ENCOUNTER — Other Ambulatory Visit: Payer: Self-pay | Admitting: Family

## 2020-10-01 ENCOUNTER — Other Ambulatory Visit: Payer: Self-pay

## 2020-10-01 DIAGNOSIS — R911 Solitary pulmonary nodule: Secondary | ICD-10-CM

## 2020-10-17 ENCOUNTER — Other Ambulatory Visit: Payer: Medicare HMO

## 2020-10-21 ENCOUNTER — Other Ambulatory Visit: Payer: Self-pay | Admitting: Physician Assistant

## 2020-10-21 ENCOUNTER — Other Ambulatory Visit: Payer: Self-pay | Admitting: Internal Medicine

## 2020-11-03 ENCOUNTER — Other Ambulatory Visit: Payer: Self-pay

## 2020-11-03 ENCOUNTER — Ambulatory Visit (INDEPENDENT_AMBULATORY_CARE_PROVIDER_SITE_OTHER): Payer: Medicare HMO | Admitting: Podiatry

## 2020-11-03 ENCOUNTER — Telehealth: Payer: Self-pay | Admitting: *Deleted

## 2020-11-03 DIAGNOSIS — M79674 Pain in right toe(s): Secondary | ICD-10-CM

## 2020-11-03 DIAGNOSIS — M7989 Other specified soft tissue disorders: Secondary | ICD-10-CM | POA: Diagnosis not present

## 2020-11-03 DIAGNOSIS — B351 Tinea unguium: Secondary | ICD-10-CM

## 2020-11-03 DIAGNOSIS — L84 Corns and callosities: Secondary | ICD-10-CM

## 2020-11-03 DIAGNOSIS — E1165 Type 2 diabetes mellitus with hyperglycemia: Secondary | ICD-10-CM | POA: Diagnosis not present

## 2020-11-03 DIAGNOSIS — E1140 Type 2 diabetes mellitus with diabetic neuropathy, unspecified: Secondary | ICD-10-CM

## 2020-11-03 DIAGNOSIS — M79675 Pain in left toe(s): Secondary | ICD-10-CM

## 2020-11-03 DIAGNOSIS — IMO0002 Reserved for concepts with insufficient information to code with codable children: Secondary | ICD-10-CM

## 2020-11-03 MED ORDER — DOXYCYCLINE HYCLATE 100 MG PO TABS: 100.0000 mg | ORAL_TABLET | Freq: Two times a day (BID) | ORAL | 0 refills | Status: DC

## 2020-11-03 NOTE — Telephone Encounter (Signed)
Hi Dr. Burt Knack,   [name] has a left TKA planned (date TBD) and will need to hold Plavix. She has a history of CAD with NSTEMI in 04/2018. Cath at that time showed diffuse disease with no targets for CABG and she was treated medically. You recently saw her on 09/17/2020 at which time she was doing well from a cardiac standpoint. Aspirin was stopped but she was continued on Plavix. Can you please comment on how long Plavix can be held for upcoming surgery?  Please route response back to P CV DIV PREOP.  Thank you! [name]

## 2020-11-03 NOTE — Telephone Encounter (Signed)
   [place] Pre-operative Risk Assessment    Patient Name: SHADAE REINO  DOB: 1961/02/09  MRN: [id number]   HEARTCARE STAFF: - Please ensure there is not already an duplicate clearance open for this procedure. - Under Visit Info/Reason for Call, type in Other and utilize the format Clearance MM/DD/YY or Clearance TBD. Do not use dashes or single digits. - If request is for dental extraction, please clarify the # of teeth to be extracted.  Request for surgical clearance:  1. What type of surgery is being performed? LEFT TKA   2. When is this surgery scheduled? TBD   3. What type of clearance is required (medical clearance vs. Pharmacy clearance to hold med vs. Both)? MEDICAL  4. Are there any medications that need to be held prior to surgery and how long? PLAVIX    5. Practice name and name of physician performing surgery? [facility]; [place]   6. What is the office phone number? [phone]   7.   What is the office fax number? [phone] ATTN: ASHLEY HITLON  8.   Anesthesia type (None, local, MAC, general) ? NOT LISTED   Julaine Hua 11/03/2020, 12:14 PM  _________________________________________________________________   (provider comments below)

## 2020-11-04 NOTE — Telephone Encounter (Signed)
Ok to hold plavix 5-7 days prior to knee replacement surgery as needed. thanks

## 2020-11-04 NOTE — Telephone Encounter (Signed)
   Name: [name]  DOB: January 25, 1961  MRN: [id number]   Primary Cardiologist: Sherren Mocha, MD  Chart reviewed as part of pre-operative protocol coverage. Patient was contacted 11/04/2020 in reference to pre-operative risk assessment for pending surgery as outlined below.  [name] was last seen on 09/17/20 by Dr. Burt Knack.  Since that day, [name] has done okay from a cardiac standpoint. She notes improvement in her DOE since her last visit. She has been limited in activity by her knee pain but is able to complete 4 METs without anginal complaints.   Therefore, based on ACC/AHA guidelines, the patient would be at acceptable risk for the planned procedure without further cardiovascular testing.   The patient was advised that if she develops new symptoms prior to surgery to contact our office to arrange for a follow-up visit, and she verbalized understanding.  Per Dr. Burt Knack, patient can hold plavix 5 days prior to her upcoming knee surgery with plans to restart when cleared to do so by her orthopedist.   I will route this recommendation to the requesting party via [place] fax function and remove from pre-op pool. Please call with questions.  Abigail Butts, PA-C 11/04/2020, 11:03 AM

## 2020-11-06 ENCOUNTER — Other Ambulatory Visit: Payer: Self-pay | Admitting: Family

## 2020-11-06 DIAGNOSIS — R911 Solitary pulmonary nodule: Secondary | ICD-10-CM

## 2020-11-07 NOTE — Progress Notes (Signed)
Subjective: 60 year old female presents the office today for concerns of pain to her left second toe.  She states that the toenail is sore she got a very thick callus causing discomfort.  She denies recent injury or trauma.  She is concerned about possible infection given the discomfort.  Denies any redness or drainage.  Other nails are thickened on the left fourth and fifth toes causing discomfort. Denies any systemic complaints such as fevers, chills, nausea, vomiting. No acute changes since last appointment, and no other complaints at this time.   Last A1c was 9.2.  Objective: AAO x3, NAD DP/PT pulses palpable bilaterally, CRT less than 3 seconds Fourth and fifth digit nails bilaterally hypertrophic, dystrophic, discolored with brown discoloration.  Tenderness the nails x4.  Thick hyperkeratotic tissue noted on the left second toe as well as the right third toe over the left second toe most significant.  There is mild swelling to the second toe there is no erythema or warmth.  Tenderness palpation to the calluses.  Upon debridement there is no underlying ulceration drainage or signs of infection but they are preulcerative.  No open lesions identified today.  Preulcerative areas on the hallux distally bilaterally. No pain with calf compression, swelling, warmth, erythema  Assessment: Thick preulcerative calluses, symptomatic onychomycosis, uncontrolled diabetes  Plan: -All treatment options discussed with the patient including all alternatives, risks, complications.  -Utilized lidocaine cream on the left second digit.  Is able to sharply debride hyperkeratotic lesions x2 without any complications or bleeding.  They are preulcerative. -Should be debrided nails x4 without any complications or bleeding. -Given the swelling to the second toe prescribe doxycycline as a precaution. -Monitor for any clinical signs or symptoms of infection and directed to call the office immediately should any occur or  go to the ER. -Glucose control  -Patient encouraged to call the office with any questions, concerns, change in symptoms.   Trula Slade DPM

## 2020-11-17 ENCOUNTER — Ambulatory Visit: Payer: Medicare HMO | Admitting: Podiatry

## 2020-11-17 ENCOUNTER — Ambulatory Visit
Admission: RE | Admit: 2020-11-17 | Discharge: 2020-11-17 | Disposition: A | Discharge status: Home / Self Care | Payer: Medicare HMO | Source: Ambulatory Visit | Attending: Family | Admitting: Family

## 2020-11-17 DIAGNOSIS — R911 Solitary pulmonary nodule: Secondary | ICD-10-CM

## 2020-11-27 ENCOUNTER — Ambulatory Visit: Payer: Medicare HMO | Admitting: Internal Medicine

## 2020-11-27 NOTE — Progress Notes (Deleted)
Name: [name]  Age/ Sex: 60 y.o., female   MRN/ DOB: [id number], 03-Jun-1961     PCP: Sonia Side., FNP   Reason for Endocrinology Evaluation: Type 2 Diabetes Mellitus  Initial Endocrine Consultative Visit: 10/07/2019    PATIENT IDENTIFIER: [name] is a 60 y.o. female with a past medical history of HTn and T2DM. The patient has followed with Endocrinology clinic since 10/07/2019 for consultative assistance with management of her diabetes.  DIABETIC HISTORY:  [name] was diagnosed with gestational diabetes in 1984, requiring insulin , she was off insulin until her next pregnancy in 1985 and has been on insulin ever since. Her hemoglobin A1c has ranged from 8.1% in 2016, peaking at 12.5% in 2020.   On her initial visit to our clinic, she had an A1c of 9.6%. She was on MDI regimen as well as victoza and metformin, which were continued and added jardiance.     Switched victoza to trulicity 09/4816 SUBJECTIVE:   During the last visit (07/29/2020): A1c 9. 7 % , we increased NovoLog and Jardiance and continued [place], metformin, and Trulicity    Today (10/31/3147): [name] is here for a follow up on diabetes management.  She checks her blood sugars multiple times daily through CGM. She has been taking less insulin than prescribed, she does admit to taking prandial insulin after meals at times  Eats 2 meals a day  Denies nausea or diarrhea   HOME DIABETES REGIMEN:  Tresiba  20 units daily  Novolog 14 units with each meal Jardiance 25 mg daily  Metformin 1000 mg twice daily  Trulicity 1.5 mg weekly    Statin: yes ACE-I/ARB: yes     CONTINUOUS GLUCOSE MONITORING RECORD INTERPRETATION Forgot receiver      Microvascular complications:   Neuropathy, hx of left foot ulcer, retinopathy   Denies: CKD   Last eye exam: Completed 06/2019  Macrovascular complications:   CAD, CHF  Denies:  PVD, CVA     HISTORY:  Past Medical History:   Past Medical History:  Diagnosis Date  . Adhesive capsulitis of shoulder    bilateral, Steroid injection Dr. Truman Hayward 1/12 bilaterally  . CAD (coronary artery disease)    nonobstructive. Last cardiac cath (2008) showing left circumflex with mid 50% stenosis and distal luminal irregularities. Also with RCA with mid to distal 30-40% stenosis. // Previously evaluated by [facility], never followed up outpatient.  Marland Kitchen CAP (community acquired pneumonia) 06/15/2012   05/2012 CXR: Mild opacification of the posterior lung base on the lateral film  as cannot exclude infection/atelectasis.    . CHF (congestive heart failure) ([place])   . Diabetes mellitus 2007   Type II, insulin dependent  . Diabetic retinopathy   . GERD (gastroesophageal reflux disease)   . Glaucoma   . Hyperlipidemia   . Hypertension   . Obesity   . Peripheral neuropathy    Past Surgical History:  Past Surgical History:  Procedure Laterality Date  . ANTERIOR CERVICAL DECOMP/DISCECTOMY FUSION N/A 06/16/2016   Procedure: Anterior Cervical Decompression/discectomy Fusion - Cervical six - Cervical seven;  Surgeon: Eustace Moore, MD;  Location: [facility];  Service: Neurosurgery;  Laterality: N/A;  Anterior Cervical Decompression/discectomy Fusion - Cervical six - Cervical seven  . APPENDECTOMY    . CARDIAC CATHETERIZATION    . CATARACT EXTRACTION    . GLAUCOMA SURGERY    . LEFT HEART CATH AND CORONARY ANGIOGRAPHY N/A 05/22/2018   Procedure: LEFT HEART CATH AND  CORONARY ANGIOGRAPHY;  Surgeon: Troy Sine, MD;  Location: [facility];  Service: Cardiovascular;  Laterality: N/A;  . LEFT HEART CATHETERIZATION WITH CORONARY ANGIOGRAM N/A 01/24/2012   Procedure: LEFT HEART CATHETERIZATION WITH CORONARY ANGIOGRAM;  Surgeon: Sherren Mocha, MD;  Location: [facility];  Service: Cardiovascular;  Laterality: N/A;  . POSTERIOR CERVICAL FUSION/FORAMINOTOMY N/A 10/22/2015   Procedure: Cervical three-Cervical Seven Posterior cervical fusion  with lateral mass fixation,  Laminectomy Cervical Three-Cervical Seven;  Surgeon: Eustace Moore, MD;  Location: [facility];  Service: Neurosurgery;  Laterality: N/A;  Posterior  . TUBAL LIGATION      Social History:  reports that she quit smoking about 14 years ago. Her smoking use included cigarettes. She has a 2.10 pack-year smoking history. She has never used smokeless tobacco. She reports that she does not drink alcohol and does not use drugs. Family History:  Family History  Problem Relation Age of Onset  . Heart disease Mother   . Hypertension Sister   . Hypertension Sister   . Hypertension Sister   . Heart attack Neg Hx   . Stroke Neg Hx      HOME MEDICATIONS: Allergies as of 11/27/2020   No Known Allergies     Medication List       Accurate as of November 27, 2020  7:31 AM. If you have any questions, ask your nurse or doctor.        amitriptyline 25 MG tablet Commonly known as: ELAVIL Take 1 tablet (25 mg total) by mouth at bedtime.   atorvastatin 80 MG tablet Commonly known as: LIPITOR Take 1 tablet (80 mg total) by mouth daily at 6 PM.   clopidogrel 75 MG tablet Commonly known as: PLAVIX Take 1 tablet (75 mg total) by mouth daily.   Combigan 0.2-0.5 % ophthalmic solution Generic drug: brimonidine-timolol Place 1 drop into both eyes 2 (two) times daily.   cyclobenzaprine 10 MG tablet Commonly known as: FLEXERIL TAKE 1 TABLET(10 MG) BY MOUTH TWICE DAILY AS NEEDED FOR MUSCLE SPASMS   diclofenac sodium 1 % Gel Commonly known as: VOLTAREN Apply 2 g topically 3 (three) times daily as needed.   diphenhydrAMINE 25 MG tablet Commonly known as: BENADRYL Take 1 tablet (25 mg total) by mouth every 6 (six) hours as needed for itching or allergies.   dorzolamide 2 % ophthalmic solution Commonly known as: TRUSOPT Place 1 drop into both eyes 2 (two) times daily.   doxycycline 100 MG tablet Commonly known as: VIBRA-TABS Take 1 tablet (100 mg total) by mouth 2 (two)  times daily.   empagliflozin 25 MG Tabs tablet Commonly known as: Jardiance Take 1 tablet (25 mg total) by mouth daily before breakfast.   fluticasone 50 MCG/ACT nasal spray Commonly known as: Flonase Place 1 spray into both nostrils daily.   FreeStyle Libre 14 Day Reader Kerrin Mo 1 each by Does not apply route 4 (four) times daily.   FreeStyle Libre 14 Day Sensor Misc 1 each by Does not apply route QID.   furosemide 20 MG tablet Commonly known as: LASIX Take 20 mg by mouth 2 (two) times daily.   gabapentin 300 MG capsule Commonly known as: NEURONTIN Take 300 mg by mouth 2 (two) times daily.   glucose blood test strip Commonly known as: OneTouch Verio USE TO CHECK BLOOD SUGAR 4 TIMES DAILY AS DIRECTED   Insulin Pen Needle 32G X 4 MM Misc Use to inject insulin up to 4 times daily  Insulin Pen Needle 32G X 4 MM Misc 1 Device by Does not apply route in the morning, at noon, in the evening, and at bedtime.   IRON PO Take 1 tablet by mouth 3 (three) times daily.   isosorbide mononitrate 60 MG 24 hr tablet Commonly known as: IMDUR Take 1 tablet (60 mg total) by mouth daily.   lisinopril 2.5 MG tablet Commonly known as: ZESTRIL TAKE 1 TABLET(2.5 MG) BY MOUTH DAILY   metFORMIN 1000 MG tablet Commonly known as: GLUCOPHAGE TAKE 1 TABLET BY MOUTH TWICE DAILY WITH A MEAL   metoprolol tartrate 50 MG tablet Commonly known as: LOPRESSOR TAKE 1 AND 1/2 TABLETS BY MOUTH TWICE DAILY   nitroGLYCERIN 0.4 MG SL tablet Commonly known as: NITROSTAT Place 1 tablet (0.4 mg total) under the tongue every 5 (five) minutes as needed for chest pain.   NovoLOG FlexPen 100 UNIT/ML FlexPen Generic drug: insulin aspart Inject 14 Units into the skin 3 (three) times daily with meals.   OneTouch Delica Lancets Fine Misc Check blood sugar as instructed up to 3 times a day   oxyCODONE-acetaminophen 5-325 MG tablet Commonly known as: PERCOCET/ROXICET Take 1 tablet by mouth every 8 (eight)  hours as needed for severe pain.   pantoprazole 40 MG tablet Commonly known as: PROTONIX Take 1 tablet (40 mg total) by mouth daily.   ranolazine 500 MG 12 hr tablet Commonly known as: RANEXA Take 1 tablet (500 mg total) by mouth 2 (two) times daily.   Santyl ointment Generic drug: collagenase Apply 1 application topically daily as needed (foot).   Tyler Aas FlexTouch 100 UNIT/ML FlexTouch Pen Generic drug: insulin degludec Inject 20 Units into the skin daily.   triamcinolone cream 0.1 % Commonly known as: KENALOG Apply 1 application topically as needed.   Trulicity 1.5 IR/5.1OA Sopn Generic drug: Dulaglutide Inject 0.5 mLs (1.5 mg total) into the skin once a week.        OBJECTIVE:   Vital Signs: LMP 02/13/2011   Wt Readings from Last 3 Encounters:  09/17/20 245 lb 6.4 oz (111.3 kg)  07/29/20 249 lb 8 oz (113.2 kg)  03/17/20 254 lb (115.2 kg)     Exam: General: Pt appears well and is in NAD  Lungs: Clear with good BS bilat with no rales, rhonchi, or wheezes  Heart: RRR with normal S1 and S2 and no gallops; no murmurs; no rub  Extremities: 1+  pretibial edema.   Neuro: MS is good with appropriate affect, pt is alert and Ox3      DM foot exam: 10/07/2019  The skin of the feet iswithout sores or ulcerations. The pedal pulses are undetectable The sensation is decreased  to a screening 5.07, 10 gram monofilament bilaterally       DATA REVIEWED:  Lab Results  Component Value Date   HGBA1C 9.2 (A) 07/29/2020   HGBA1C 9.6 (A) 10/07/2019   HGBA1C 10.1 (A) 02/21/2018   Lab Results  Component Value Date   MICROALBUR <0.7 07/29/2020   LDLCALC 70 09/17/2020   CREATININE 1.13 07/29/2020   Lab Results  Component Value Date   MICRALBCREAT 0.9 07/29/2020     Lab Results  Component Value Date   CHOL 145 09/17/2020   HDL 53 09/17/2020   LDLCALC 70 09/17/2020   TRIG 126 09/17/2020   CHOLHDL 2.7 09/17/2020       Results for [name], [name] (MRN  [id number]) as of 07/29/2020 15:38  Ref. Range 07/29/2020 10:12  Sodium Latest Ref  Range: 135 - 145 mEq/L 138  Potassium Latest Ref Range: 3.5 - 5.1 mEq/L 4.0  Chloride Latest Ref Range: 96 - 112 mEq/L 102  CO2 Latest Ref Range: 19 - 32 mEq/L 29  Glucose Latest Ref Range: 70 - 99 mg/dL 131 (H)  BUN Latest Ref Range: 6 - 23 mg/dL 18  Creatinine Latest Ref Range: 0.40 - 1.20 mg/dL 1.13  Calcium Latest Ref Range: 8.4 - 10.5 mg/dL 9.7  GFR Latest Ref Range: >60.00 mL/min 52.48 (L)  MICROALB/CREAT RATIO Latest Ref Range: 0.0 - 30.0 mg/g 0.9  Creatinine,U Latest Units: mg/dL 79.1  Microalb, Ur Latest Ref Range: 0.0 - 1.9 mg/dL <0.7     ASSESSMENT / PLAN / RECOMMENDATIONS:   1) Type 2 Diabetes Mellitus, Poorly controlled, With retinopathic,  neuropathic, and macrovascular  complications complications - Most recent A1c of 9.3 %. Goal A1c < 7.0 %.     - She forgot her CGM receiver today and we unable to download data, hence limited information  - A1c continues to be above goal with slight improvement - ON her last visit ( 08/2019) she told me she was taking 4 units of Novolog so we increased it to 6 units but today she tells me she has been taking 12 units  - Advised pt to avoid snacks or choose low carb options - Will make the following adjustments - She is happy with switching victoza to Trulicity   MEDICATIONS: - Continue Tresiba 20 units daily  - Increase Novolog to 14 units with each meal  - Increase  Jardiance to 25 mg daily - Continue Metformin 1000 mg twice daily  - Continue Trulicity 1.5 mg ONCE WEEKLY    EDUCATION / INSTRUCTIONS:  BG monitoring instructions: Patient is instructed to check her blood sugars 4 times a day, before meals and bedtime   Call [facility] if: BG persistently < 70  . I reviewed the Rule of 15 for the treatment of hypoglycemia in detail with the patient. Literature supplied.    2) Diabetic complications:   Eye: Does have known  diabetic retinopathy.   Neuro/ Feet: Does have known diabetic peripheral neuropathy .   Renal: Patient does not have known baseline CKD. She   is on an ACEI/ARB at present.    F/U in 4 months    Signed electronically by: Mack Guise, MD  [facility]  [facility] [address] [place], [place] [address] Phone: [phone] FAX: [phone]   CC: [address] [place] [address] Phone: [phone]  Fax: [phone]  Return to Endocrinology clinic as below: Future Appointments  Date Time Provider [place]  11/27/2020  9:10 AM Akeylah Hendel, Melanie Crazier, MD [facility] None  03/24/2021  8:00 AM [facility]

## 2020-12-02 ENCOUNTER — Other Ambulatory Visit: Payer: Self-pay | Admitting: Cardiovascular Disease

## 2020-12-09 ENCOUNTER — Other Ambulatory Visit: Payer: Self-pay | Admitting: Cardiovascular Disease

## 2021-01-25 ENCOUNTER — Telehealth: Payer: Self-pay | Admitting: Internal Medicine

## 2021-01-25 NOTE — Telephone Encounter (Signed)
MEDICATION: Dulaglutide (TRULICITY) 1.5 0000000 SOPN  PHARMACY:   [id number] [place], [place] - [address] AT [facility] Phone:  [phone]  Fax:  [phone]      HAS THE PATIENT CONTACTED THEIR PHARMACY?  Yes-no refills on file  IS THIS A 90 DAY SUPPLY : Yes  IS PATIENT OUT OF MEDICATION: Yes  IF NOT; HOW MUCH IS LEFT: 0  LAST APPOINTMENT DATE: '@2'$ /22022  NEXT APPOINTMENT DATE:'@8'$ /08/2020  DO WE HAVE YOUR PERMISSION TO LEAVE A DETAILED MESSAGE?: Yes  OTHER COMMENTS:    **Let patient know to contact pharmacy at the end of the day to make sure medication is ready. **  ** Please notify patient to allow 48-72 hours to process**  **Encourage patient to contact the pharmacy for refills or they can request refills through [facility]**

## 2021-01-26 ENCOUNTER — Other Ambulatory Visit: Payer: Self-pay

## 2021-01-26 MED ORDER — TRULICITY 1.5 MG/0.5ML ~~LOC~~ SOAJ: 1.5000 mg | SUBCUTANEOUS | 3 refills | Status: DC

## 2021-01-26 NOTE — Telephone Encounter (Signed)
Sent to pharmacy 

## 2021-01-27 ENCOUNTER — Other Ambulatory Visit: Payer: Self-pay

## 2021-01-27 ENCOUNTER — Ambulatory Visit (INDEPENDENT_AMBULATORY_CARE_PROVIDER_SITE_OTHER): Payer: Medicare HMO | Admitting: Internal Medicine

## 2021-01-27 VITALS — BP 146/82 | HR 120 | Ht 66.0 in | Wt 239.8 lb

## 2021-01-27 DIAGNOSIS — L089 Local infection of the skin and subcutaneous tissue, unspecified: Secondary | ICD-10-CM | POA: Diagnosis not present

## 2021-01-27 DIAGNOSIS — E11319 Type 2 diabetes mellitus with unspecified diabetic retinopathy without macular edema: Secondary | ICD-10-CM

## 2021-01-27 DIAGNOSIS — Z794 Long term (current) use of insulin: Secondary | ICD-10-CM | POA: Diagnosis not present

## 2021-01-27 DIAGNOSIS — E1142 Type 2 diabetes mellitus with diabetic polyneuropathy: Secondary | ICD-10-CM | POA: Diagnosis not present

## 2021-01-27 DIAGNOSIS — E1159 Type 2 diabetes mellitus with other circulatory complications: Secondary | ICD-10-CM

## 2021-01-27 LAB — POCT GLYCOSYLATED HEMOGLOBIN (HGB A1C): Hemoglobin A1C: 11.3 % — AB (ref 4.0–5.6)

## 2021-01-27 MED ORDER — TRULICITY 3 MG/0.5ML ~~LOC~~ SOAJ: 3.0000 mg | SUBCUTANEOUS | 3 refills | Status: DC

## 2021-01-27 MED ORDER — AMOXICILLIN-POT CLAVULANATE 875-125 MG PO TABS: 1.0000 | ORAL_TABLET | Freq: Two times a day (BID) | ORAL | 0 refills | Status: DC

## 2021-01-27 MED ORDER — TRESIBA FLEXTOUCH 100 UNIT/ML ~~LOC~~ SOPN: 24.0000 [IU] | PEN_INJECTOR | Freq: Every day | SUBCUTANEOUS | 3 refills | Status: DC

## 2021-01-27 NOTE — Progress Notes (Signed)
Name: [name]  Age/ Sex: 60 y.o., female   MRN/ DOB: [id number], 15-May-1961     PCP: Sonia Side., FNP   Reason for Endocrinology Evaluation: Type 2 Diabetes Mellitus  Initial Endocrine Consultative Visit: 10/07/2019    PATIENT IDENTIFIER: [name] is a 60 y.o. female with a past medical history of HTn and T2DM. The patient has followed with Endocrinology clinic since 10/07/2019 for consultative assistance with management of her diabetes.  DIABETIC HISTORY:  [name] was diagnosed with gestational diabetes in 1984, requiring insulin , she was off insulin until her next pregnancy in 1985 and has been on insulin ever since. Her hemoglobin A1c has ranged from 8.1% in 2016, peaking at 12.5% in 2020.   On her initial visit to our clinic, she had an A1c of 9.6%. She was on MDI regimen as well as victoza and metformin, which were continued and added jardiance.     Switched victoza to trulicity [id number] SUBJECTIVE:   During the last visit (07/29/2020): A1c 9.3 % , we continued metformin and Trulicity, we increase Jardiance, we adjusted MDI regimen    Today (01/27/2021): [name] is here for a follow up on diabetes management.  She checks her blood sugars multiple times daily through CGM. She has been taking less insulin than prescribed, she does admit to taking prandial insulin after meals at times     Denies nausea or diarrhea   She could not remember the dose of NOvolog     HOME DIABETES REGIMEN:  Tresiba  20 units daily  Novolog 14 units with each meal Jardiance 25 mg daily  Metformin 1000 mg twice daily  Trulicity 1.5 mg weekly    Statin: yes ACE-I/ARB: yes     CONTINUOUS GLUCOSE MONITORING RECORD INTERPRETATION    Dates of Recording: 7/15-7/28/2022  Sensor description:freestyle libre  Results statistics:   CGM use % of time 34  Average and SD 217/33  Time in range 35       %  % Time Above 180 29  % Time above 250 36  % Time Below  target 0     Glycemic patterns summary: hyperglycemia during the day and most night   Hyperglycemic episodes  postprandial  Hypoglycemic episodes occurred post-bolus  Overnight periods: trends down         Microvascular complications:  Neuropathy, hx of left foot ulcer, retinopathy  Denies: CKD  Last eye exam: Completed 06/2019   Macrovascular complications:  CAD, CHF Denies:  PVD, CVA       HISTORY:  Past Medical History:  Past Medical History:  Diagnosis Date   Adhesive capsulitis of shoulder    bilateral, Steroid injection Dr. Truman Hayward 1/12 bilaterally   CAD (coronary artery disease)    nonobstructive. Last cardiac cath (2008) showing left circumflex with mid 50% stenosis and distal luminal irregularities. Also with RCA with mid to distal 30-40% stenosis. // Previously evaluated by [name], never followed up outpatient.   CAP (community acquired pneumonia) 06/15/2012   05/2012 CXR: Mild opacification of the posterior lung base on the lateral film  as cannot exclude infection/atelectasis.     CHF (congestive heart failure) ([place])    Diabetes mellitus 2007   Type II, insulin dependent   Diabetic retinopathy    GERD (gastroesophageal reflux disease)    Glaucoma    Hyperlipidemia    Hypertension    Obesity    Peripheral neuropathy    Past Surgical History:  Past Surgical History:  Procedure Laterality Date   ANTERIOR CERVICAL DECOMP/DISCECTOMY FUSION N/A 06/16/2016   Procedure: Anterior Cervical Decompression/discectomy Fusion - Cervical six - Cervical seven;  Surgeon: Eustace Moore, MD;  Location: [facility];  Service: Neurosurgery;  Laterality: N/A;  Anterior Cervical Decompression/discectomy Fusion - Cervical six - Cervical seven   APPENDECTOMY     CARDIAC CATHETERIZATION     CATARACT EXTRACTION     GLAUCOMA SURGERY     LEFT HEART CATH AND CORONARY ANGIOGRAPHY N/A 05/22/2018   Procedure: LEFT HEART CATH AND CORONARY ANGIOGRAPHY;  Surgeon: Troy Sine, MD;  Location: [facility];  Service: Cardiovascular;  Laterality: N/A;   LEFT HEART CATHETERIZATION WITH CORONARY ANGIOGRAM N/A 01/24/2012   Procedure: LEFT HEART CATHETERIZATION WITH CORONARY ANGIOGRAM;  Surgeon: Sherren Mocha, MD;  Location: [facility] CATH LAB;  Service: Cardiovascular;  Laterality: N/A;   POSTERIOR CERVICAL FUSION/FORAMINOTOMY N/A 10/22/2015   Procedure: Cervical three-Cervical Seven Posterior cervical fusion with lateral mass fixation,  Laminectomy Cervical Three-Cervical Seven;  Surgeon: Eustace Moore, MD;  Location: [facility];  Service: Neurosurgery;  Laterality: N/A;  Posterior   TUBAL LIGATION     Social History:  reports that she quit smoking about 14 years ago. Her smoking use included cigarettes. She has a 2.10 pack-year smoking history. She has never used smokeless tobacco. She reports that she does not drink alcohol and does not use drugs. Family History:  Family History  Problem Relation Age of Onset   Heart disease Mother    Hypertension Sister    Hypertension Sister    Hypertension Sister    Heart attack Neg Hx    Stroke Neg Hx      HOME MEDICATIONS: Allergies as of 01/27/2021   No Known Allergies      Medication List        Accurate as of January 27, 2021  4:08 PM. If you have any questions, ask your nurse or doctor.          amitriptyline 25 MG tablet Commonly known as: ELAVIL Take 1 tablet (25 mg total) by mouth at bedtime.   atorvastatin 80 MG tablet Commonly known as: LIPITOR Take 1 tablet (80 mg total) by mouth daily at 6 PM.   clopidogrel 75 MG tablet Commonly known as: PLAVIX Take 1 tablet (75 mg total) by mouth daily.   Combigan 0.2-0.5 % ophthalmic solution Generic drug: brimonidine-timolol Place 1 drop into both eyes 2 (two) times daily.   cyclobenzaprine 10 MG tablet Commonly known as: FLEXERIL TAKE 1 TABLET(10 MG) BY MOUTH TWICE DAILY AS NEEDED FOR MUSCLE SPASMS   diclofenac sodium 1 % Gel Commonly known as:  VOLTAREN Apply 2 g topically 3 (three) times daily as needed.   diphenhydrAMINE 25 MG tablet Commonly known as: BENADRYL Take 1 tablet (25 mg total) by mouth every 6 (six) hours as needed for itching or allergies.   dorzolamide 2 % ophthalmic solution Commonly known as: TRUSOPT Place 1 drop into both eyes 2 (two) times daily.   doxycycline 100 MG tablet Commonly known as: VIBRA-TABS Take 1 tablet (100 mg total) by mouth 2 (two) times daily.   empagliflozin 25 MG Tabs tablet Commonly known as: Jardiance Take 1 tablet (25 mg total) by mouth daily before breakfast.   fluticasone 50 MCG/ACT nasal spray Commonly known as: Flonase Place 1 spray into both nostrils daily.   FreeStyle Libre 14 Day Reader Kerrin Mo 1 each by Does not apply route 4 (four)  times daily.   FreeStyle Libre 14 Day Sensor Misc 1 each by Does not apply route QID.   furosemide 20 MG tablet Commonly known as: LASIX Take 20 mg by mouth 2 (two) times daily.   gabapentin 300 MG capsule Commonly known as: NEURONTIN Take 300 mg by mouth 2 (two) times daily.   glucose blood test strip Commonly known as: OneTouch Verio USE TO CHECK BLOOD SUGAR 4 TIMES DAILY AS DIRECTED   Insulin Pen Needle 32G X 4 MM Misc Use to inject insulin up to 4 times daily   Insulin Pen Needle 32G X 4 MM Misc 1 Device by Does not apply route in the morning, at noon, in the evening, and at bedtime.   IRON PO Take 1 tablet by mouth 3 (three) times daily.   isosorbide mononitrate 60 MG 24 hr tablet Commonly known as: IMDUR TAKE 1 TABLET EVERY DAY   lisinopril 2.5 MG tablet Commonly known as: ZESTRIL TAKE 1 TABLET(2.5 MG) BY MOUTH DAILY   metFORMIN 1000 MG tablet Commonly known as: GLUCOPHAGE TAKE 1 TABLET BY MOUTH TWICE DAILY WITH A MEAL   metoprolol tartrate 50 MG tablet Commonly known as: LOPRESSOR TAKE 1 AND 1/2 TABLETS TWICE DAILY   nitroGLYCERIN 0.4 MG SL tablet Commonly known as: NITROSTAT DISSOLVE 1 TABLET (0.4 MG  TOTAL) UNDER THE TONGUE EVERY 5 MINUTES AS NEEDED FOR CHEST PAIN.   NovoLOG FlexPen 100 UNIT/ML FlexPen Generic drug: insulin aspart Inject 14 Units into the skin 3 (three) times daily with meals.   OneTouch Delica Lancets Fine Misc Check blood sugar as instructed up to 3 times a day   oxyCODONE-acetaminophen 5-325 MG tablet Commonly known as: PERCOCET/ROXICET Take 1 tablet by mouth every 8 (eight) hours as needed for severe pain.   pantoprazole 40 MG tablet Commonly known as: PROTONIX Take 1 tablet (40 mg total) by mouth daily.   ranolazine 500 MG 12 hr tablet Commonly known as: RANEXA Take 1 tablet (500 mg total) by mouth 2 (two) times daily.   Santyl ointment Generic drug: collagenase Apply 1 application topically daily as needed (foot).   Tyler Aas FlexTouch 100 UNIT/ML FlexTouch Pen Generic drug: insulin degludec Inject 20 Units into the skin daily.   triamcinolone cream 0.1 % Commonly known as: KENALOG Apply 1 application topically as needed.   Trulicity 1.5 0000000 Sopn Generic drug: Dulaglutide Inject 1.5 mg into the skin once a week.         OBJECTIVE:   Vital Signs: BP (!) 146/82   Pulse (!) 120   Ht '5\' 6"'$  (1.676 m)   Wt 239 lb 12.8 oz (108.8 kg)   LMP 02/13/2011   SpO2 99%   BMI 38.70 kg/m   Wt Readings from Last 3 Encounters:  01/27/21 239 lb 12.8 oz (108.8 kg)  09/17/20 245 lb 6.4 oz (111.3 kg)  07/29/20 249 lb 8 oz (113.2 kg)     Exam: General: Pt appears well and is in NAD  Extremities: Trace  pretibial edema.   Neuro: MS is good with appropriate affect, pt is alert and Ox3      DM foot exam: 01/27/2021   The skin of the feet  showed callous formation of the distal right great toe , deformed right 2nd toe  She has left 2nd toe swelling, and tenderness as well as distal callous formation  The pedal pulses are undetectable The sensation is decreased  to a screening 5.07, 10 gram monofilament bilaterally  DATA REVIEWED:  Lab  Results  Component Value Date   HGBA1C 11.3 (A) 01/27/2021   HGBA1C 9.2 (A) 07/29/2020   HGBA1C 9.6 (A) 10/07/2019   Lab Results  Component Value Date   MICROALBUR <0.7 07/29/2020   LDLCALC 70 09/17/2020   CREATININE 1.13 07/29/2020   Lab Results  Component Value Date   MICRALBCREAT 0.9 07/29/2020     Lab Results  Component Value Date   CHOL 145 09/17/2020   HDL 53 09/17/2020   LDLCALC 70 09/17/2020   TRIG 126 09/17/2020   CHOLHDL 2.7 09/17/2020       Results for [name], [name] (MRN [id number]) as of 07/29/2020 15:38  Ref. Range 07/29/2020 10:12  Sodium Latest Ref Range: 135 - 145 mEq/L 138  Potassium Latest Ref Range: 3.5 - 5.1 mEq/L 4.0  Chloride Latest Ref Range: 96 - 112 mEq/L 102  CO2 Latest Ref Range: 19 - 32 mEq/L 29  Glucose Latest Ref Range: 70 - 99 mg/dL 131 (H)  BUN Latest Ref Range: 6 - 23 mg/dL 18  Creatinine Latest Ref Range: 0.40 - 1.20 mg/dL 1.13  Calcium Latest Ref Range: 8.4 - 10.5 mg/dL 9.7  GFR Latest Ref Range: >60.00 mL/min 52.48 (L)  MICROALB/CREAT RATIO Latest Ref Range: 0.0 - 30.0 mg/g 0.9  Creatinine,U Latest Units: mg/dL 79.1  Microalb, Ur Latest Ref Range: 0.0 - 1.9 mg/dL <0.7     ASSESSMENT / PLAN / RECOMMENDATIONS:   1) Type 2 Diabetes Mellitus, Poorly controlled, With retinopathic,  neuropathic, and macrovascular  complications complications - Most recent A1c of 11.3 %. Goal A1c < 7.0 %.     -Patient continues with hyperglycemia, and review of her CGM download it is clear to me that she does not take her prandial dose on consistent basis. -I am not too sure about [place] and Trulicity -But I am going to make the following changes -I again have encouraged her to stay compliant with medication intake, will discuss the risk of infections with hyperglycemia -We will consider an insulin pump in the future if no glycemic improvement  MEDICATIONS: -  Increase  Tresiba to 24 units daily  - Continue  Novolog 12 units with each meal  -  Continue  Jardiance  25 mg daily - Continue Metformin 1000 mg twice daily  - Increase Trulicity to 3.0  mg ONCE WEEKLY   EDUCATION / INSTRUCTIONS: BG monitoring instructions: Patient is instructed to check her blood sugars 4 times a day, before meals and bedtime  Call [facility] if: BG persistently < 70  I reviewed the Rule of 15 for the treatment of hypoglycemia in detail with the patient. Literature supplied.    2) Diabetic complications:  Eye: Does have known diabetic retinopathy.  Neuro/ Feet: Does have known diabetic peripheral neuropathy .  Renal: Patient does not have known baseline CKD. She   is on an ACEI/ARB at present.   3. Left 2nd toe infection :  -She has swelling and tenderness of the left second toe, limited exam due to severe pain.  I am going to prescribe her Augmentin, she will be scheduled to see Dr. Earleen Newport ASAP  Medication Augmentin 850 mg 1 tab twice daily x10 days    F/U in 3 months    Signed electronically by: Mack Guise, MD  [facility]  [facility] [address] [place], [place] [address] Phone: [phone] FAX: [phone]   CC: Sonia Side., FNP [phone]  [place] [address] Phone: [phone]  Fax: [phone]  Return to Endocrinology clinic as below: Future Appointments  Date Time Provider [place]  03/24/2021  8:00 AM [facility]

## 2021-01-27 NOTE — Patient Instructions (Addendum)
-   Increase  Tresiba to 24 units daily  - Continue  Novolog 12 units with each meal  - Continue  Jardiance  25 mg daily - Continue Metformin 1000 mg twice daily  - Increase Trulicity to 3.0  mg ONCE WEEKLY    - Start Augmentin 1 tablet twice a day for 10 days   HOW TO TREAT LOW BLOOD SUGARS (Blood sugar LESS THAN 70 MG/DL) Please follow the RULE OF 15 for the treatment of hypoglycemia treatment (when your (blood sugars are less than 70 mg/dL)   STEP 1: Take 15 grams of carbohydrates when your blood sugar is low, which includes:  3-4 GLUCOSE TABS  OR 3-4 OZ OF JUICE OR REGULAR SODA OR ONE TUBE OF GLUCOSE GEL    STEP 2: RECHECK blood sugar in 15 MINUTES STEP 3: If your blood sugar is still low at the 15 minute recheck --> then, go back to STEP 1 and treat AGAIN with another 15 grams of carbohydrates.

## 2021-01-28 ENCOUNTER — Encounter: Payer: Self-pay | Admitting: Internal Medicine

## 2021-01-28 ENCOUNTER — Ambulatory Visit (INDEPENDENT_AMBULATORY_CARE_PROVIDER_SITE_OTHER): Payer: Medicare HMO | Admitting: Podiatry

## 2021-01-28 ENCOUNTER — Ambulatory Visit (INDEPENDENT_AMBULATORY_CARE_PROVIDER_SITE_OTHER): Payer: Medicare HMO

## 2021-01-28 DIAGNOSIS — IMO0002 Reserved for concepts with insufficient information to code with codable children: Secondary | ICD-10-CM

## 2021-01-28 DIAGNOSIS — L84 Corns and callosities: Secondary | ICD-10-CM

## 2021-01-28 DIAGNOSIS — E1140 Type 2 diabetes mellitus with diabetic neuropathy, unspecified: Secondary | ICD-10-CM | POA: Diagnosis not present

## 2021-01-28 DIAGNOSIS — M7989 Other specified soft tissue disorders: Secondary | ICD-10-CM

## 2021-01-28 DIAGNOSIS — E1165 Type 2 diabetes mellitus with hyperglycemia: Secondary | ICD-10-CM | POA: Diagnosis not present

## 2021-01-28 DIAGNOSIS — M79675 Pain in left toe(s): Secondary | ICD-10-CM | POA: Diagnosis not present

## 2021-01-28 NOTE — Progress Notes (Signed)
Subjective: 60 year old female presents the office today for concerns of pain to her left second toe.  She was is on her endocrinologist and she was prescribed Augmentin but she did not pick up today.  She states that she has been getting quite a bit of pain to the tip of the toe resulted in a callus, possible wound.  Denies any drainage or pus.  Minimal swelling to the toe she reports.  No redness no red streaks.  Denies any fevers or chills or any systemic symptoms.  Last A1c was 11.3 on 01/27/2021  Objective: AAO x3, NAD DP/PT pulses palpable bilaterally, CRT less than 3 seconds Thick hyperkeratotic lesion of the distal aspect of the second toe with tenderness palpation.  There is mild edema to the toe with no erythema or warmth.  There is no fluctuation crepitation.  Upon debride the hyperkeratotic lesions preulcerative there is no definitive skin breakdown identified today.  Hammertoe present. No pain with calf compression.  Assessment: Thick preulcerative calluses, uncontrolled diabetes  Plan: -All treatment options discussed with the patient including all alternatives, risks, complications.  -X-rays obtained and reviewed.  No evidence of acute fracture, osteomyelitis.  Hammertoe contractures evident. -Given the pain anesthetize the toe.  Skin was cleaned alcohol and 3 cc lidocaine, Marcaine plain was infiltrated digital block fashion.  Once anesthetized I was able to sharply debride the hyperkeratotic lesion the distal portion of the toe and complications or bleeding.  No definitive skin breakdown identified today.  The area is preulcerative.  Given the edema still recommended continue with Augmentin.  Offloading pads dispensed.  Monitoring signs or symptoms of skin breakdown or worsening signs of infection reported emergency room call the office immediately should any occur. -Glucose control discussed.  Trula Slade DPM

## 2021-02-01 ENCOUNTER — Telehealth: Payer: Self-pay | Admitting: Internal Medicine

## 2021-02-01 NOTE — Telephone Encounter (Signed)
error 

## 2021-02-02 ENCOUNTER — Encounter: Payer: Self-pay | Admitting: Podiatry

## 2021-02-02 ENCOUNTER — Ambulatory Visit (INDEPENDENT_AMBULATORY_CARE_PROVIDER_SITE_OTHER): Payer: Medicare HMO | Admitting: Podiatry

## 2021-02-02 ENCOUNTER — Other Ambulatory Visit: Payer: Self-pay

## 2021-02-02 DIAGNOSIS — L84 Corns and callosities: Secondary | ICD-10-CM | POA: Diagnosis not present

## 2021-02-02 DIAGNOSIS — IMO0002 Reserved for concepts with insufficient information to code with codable children: Secondary | ICD-10-CM

## 2021-02-02 DIAGNOSIS — E1165 Type 2 diabetes mellitus with hyperglycemia: Secondary | ICD-10-CM | POA: Diagnosis not present

## 2021-02-02 DIAGNOSIS — E1140 Type 2 diabetes mellitus with diabetic neuropathy, unspecified: Secondary | ICD-10-CM | POA: Diagnosis not present

## 2021-02-05 NOTE — Progress Notes (Signed)
Subjective: 60 year old female presents the office today for follow-up evaluation of preulcerative callus on the left second toe.  She states doing a lot better.  No drainage or pus.  No symptoms when she reports no redness or warmth.  Also she has a callus on the right big toe as well as the second digit medially trimmed.  She can use offloading pads which does help.  No fevers or chills.  No other concerns.  Last A1c was 11.3 on 01/27/2021  Objective: AAO x3, NAD DP/PT pulses palpable bilaterally, CRT less than 3 seconds No significant hyperkeratotic tissue of the left second toe.  There is no edema, erythema or open lesion identified today.  There is no fluctuance crepitation.  No malodor.  Thick hyperkeratotic lesion on the right hallux, second digit without any underlying ulceration drainage or signs of infection. Hammertoes are evident. No pain with calf compression.  Assessment: Thick preulcerative calluses, uncontrolled diabetes  Plan: -All treatment options discussed with the patient including all alternatives, risks, complications.  -Left second toe is doing much better.  Continue offloading.  Monitor for any skin breakdown or signs of infection. -Sharply debrided the hyperkeratotic lesions x2 on the right foot without any complications or bleeding.  Continue offloading.  Monitor for any skin breakdown/ulcerations -Glucose control.  Return in about 3 months (around 05/05/2021).  Trula Slade DPM

## 2021-02-08 ENCOUNTER — Other Ambulatory Visit: Payer: Medicare HMO

## 2021-02-25 ENCOUNTER — Other Ambulatory Visit: Payer: Self-pay | Admitting: Internal Medicine

## 2021-03-24 ENCOUNTER — Other Ambulatory Visit: Payer: Self-pay

## 2021-03-24 ENCOUNTER — Other Ambulatory Visit: Payer: Medicare HMO | Admitting: *Deleted

## 2021-03-24 DIAGNOSIS — E782 Mixed hyperlipidemia: Secondary | ICD-10-CM

## 2021-03-24 LAB — LIPID PANEL
Chol/HDL Ratio: 3.4 ratio (ref 0.0–4.4)
Cholesterol, Total: 159 mg/dL (ref 100–199)
HDL: 47 mg/dL (ref >39)
LDL Chol Calc (NIH): 72 mg/dL (ref 0–99)
Triglycerides: 247 mg/dL — ABNORMAL HIGH (ref 0–149)
VLDL Cholesterol Cal: 40 mg/dL (ref 5–40)

## 2021-03-29 ENCOUNTER — Other Ambulatory Visit: Payer: Self-pay | Admitting: *Deleted

## 2021-03-29 DIAGNOSIS — E782 Mixed hyperlipidemia: Secondary | ICD-10-CM

## 2021-03-29 MED ORDER — ROSUVASTATIN CALCIUM 40 MG PO TABS: 40.0000 mg | ORAL_TABLET | Freq: Every day | ORAL | 3 refills | Status: DC

## 2021-04-19 ENCOUNTER — Other Ambulatory Visit: Payer: Self-pay | Admitting: Internal Medicine

## 2021-04-19 ENCOUNTER — Other Ambulatory Visit: Payer: Self-pay | Admitting: Physician Assistant

## 2021-04-21 ENCOUNTER — Telehealth: Payer: Self-pay | Admitting: Cardiovascular Disease

## 2021-04-21 NOTE — Telephone Encounter (Signed)
Patient states that she needs her CT that was done at the office that shows she has a nodule on her lungs faxed to [facility].  [phone]

## 2021-04-21 NOTE — Telephone Encounter (Signed)
Spoke with the patient. Results are available for [name] pulmonology.

## 2021-05-06 ENCOUNTER — Ambulatory Visit: Payer: Medicare HMO | Admitting: Internal Medicine

## 2021-05-06 ENCOUNTER — Ambulatory Visit: Payer: Medicare HMO | Admitting: Podiatry

## 2021-06-01 ENCOUNTER — Other Ambulatory Visit: Payer: Self-pay

## 2021-06-01 MED ORDER — EMPAGLIFLOZIN 25 MG PO TABS: ORAL_TABLET | ORAL | 2 refills | Status: DC

## 2021-06-04 ENCOUNTER — Other Ambulatory Visit: Payer: Self-pay

## 2021-06-04 ENCOUNTER — Ambulatory Visit (INDEPENDENT_AMBULATORY_CARE_PROVIDER_SITE_OTHER): Payer: Medicare HMO | Admitting: Internal Medicine

## 2021-06-04 ENCOUNTER — Encounter: Payer: Self-pay | Admitting: Internal Medicine

## 2021-06-04 VITALS — BP 110/64 | HR 84 | Resp 18 | Wt 235.4 lb

## 2021-06-04 DIAGNOSIS — Z794 Long term (current) use of insulin: Secondary | ICD-10-CM | POA: Diagnosis not present

## 2021-06-04 DIAGNOSIS — E1142 Type 2 diabetes mellitus with diabetic polyneuropathy: Secondary | ICD-10-CM

## 2021-06-04 LAB — POCT GLYCOSYLATED HEMOGLOBIN (HGB A1C): Hemoglobin A1C: 12.8 % — AB (ref 4.0–5.6)

## 2021-06-04 LAB — BASIC METABOLIC PANEL
BUN: 22 mg/dL (ref 6–23)
CO2: 27 mEq/L (ref 19–32)
Calcium: 9.8 mg/dL (ref 8.4–10.5)
Chloride: 103 mEq/L (ref 96–112)
Creatinine, Ser: 1.21 mg/dL — ABNORMAL HIGH (ref 0.40–1.20)
GFR: 48.06 mL/min — ABNORMAL LOW (ref >60.00)
Glucose, Bld: 143 mg/dL — ABNORMAL HIGH (ref 70–99)
Potassium: 4.5 mEq/L (ref 3.5–5.1)
Sodium: 140 mEq/L (ref 135–145)

## 2021-06-04 LAB — TSH: TSH: 1.71 u[IU]/mL (ref 0.35–5.50)

## 2021-06-04 LAB — MICROALBUMIN / CREATININE URINE RATIO
Creatinine,U: 64.9 mg/dL
Microalb Creat Ratio: 1.1 mg/g (ref 0.0–30.0)
Microalb, Ur: <0.7 mg/dL (ref 0.0–1.9)

## 2021-06-04 LAB — T4, FREE: Free T4: 0.68 ng/dL (ref 0.60–1.60)

## 2021-06-04 NOTE — Patient Instructions (Signed)
-   Increase  Tresiba to 30 units daily  - Decrease  Novolog 10 units with each meal  - Continue  Jardiance  25 mg daily - Continue Metformin 1000 mg twice daily  - Continue  Trulicity 3.0  mg ONCE WEEKLY  -Novolog correctional insulin: ADD extra units on insulin to your meal-time Novolog dose if your blood sugars are higher than 160. Use the scale below to help guide you:   Blood sugar before meal Number of units to inject  Less than 160 0 unit  161 -  190 1 units  191 -  220 2 units  241 -  250 3 units  251 -  280 4 units  281 -  310 5 units  311 -  340 6 units  341 -  370 7 units  371 -  400 8 units  401 - 430 9 units      HOW TO TREAT LOW BLOOD SUGARS (Blood sugar LESS THAN 70 MG/DL) Please follow the RULE OF 15 for the treatment of hypoglycemia treatment (when your (blood sugars are less than 70 mg/dL)   STEP 1: Take 15 grams of carbohydrates when your blood sugar is low, which includes:  3-4 GLUCOSE TABS  OR 3-4 OZ OF JUICE OR REGULAR SODA OR ONE TUBE OF GLUCOSE GEL    STEP 2: RECHECK blood sugar in 15 MINUTES STEP 3: If your blood sugar is still low at the 15 minute recheck --> then, go back to STEP 1 and treat AGAIN with another 15 grams of carbohydrates

## 2021-06-04 NOTE — Progress Notes (Signed)
Name: [name]  Age/ Sex: 60 y.o., female   MRN/ DOB: [id number], 12-Feb-1961     PCP: Sonia Side., FNP   Reason for Endocrinology Evaluation: Type 2 Diabetes Mellitus  Initial Endocrine Consultative Visit: 10/07/2019    PATIENT IDENTIFIER: [name] is a 60 y.o. female with a past medical history of HTn and T2DM. The patient has followed with Endocrinology clinic since 10/07/2019 for consultative assistance with management of her diabetes.  DIABETIC HISTORY:  [name] was diagnosed with gestational diabetes in 1984, requiring insulin , she was off insulin until her next pregnancy in 1985 and has been on insulin ever since. Her hemoglobin A1c has ranged from 8.1% in 2016, peaking at 12.5% in 2020.   On her initial visit to our clinic, she had an A1c of 9.6%. She was on MDI regimen as well as victoza and metformin, which were continued and added jardiance.   Developed yeast infections to Jardiance 12/2019  Switched victoza to trulicity 12/8936  SUBJECTIVE:   During the last visit (01/27/2021): A1c 11.3 % , we increase basal insulin and Trulicity, continued metformin,  Jardiance, and prandial insulin     Today (06/04/2021): [name] is here for a follow up on diabetes management.  She checks her blood sugars multiple times daily through CGM. She has  hypoglycemic episodes since her last visit. She is symptomatic with these episodes.    She admits to drinking regular sodas  Had cold symptoms  Denies nausea, vomiting or diarrhea    HOME DIABETES REGIMEN:  Tresiba  24 units daily  Novolog 12 units with each meal Jardiance 25 mg daily  Metformin 1000 mg twice daily  Trulicity 3.0 mg weekly ( Fridays)   Statin: yes ACE-I/ARB: yes     CONTINUOUS GLUCOSE MONITORING RECORD INTERPRETATION    Dates of Recording: 11/26-12/02/2021  Sensor description:freestyle libre  Results statistics:   CGM use % of time 54  Average and SD 227/30.4  Time in range  22     %  % Time Above 180 43  % Time above 250 34  % Time Below target 1     Glycemic patterns summary: hyperglycemia noted during the day and night   Hyperglycemic episodes  postprandial and night   Hypoglycemic episodes occurred post-bolus  Overnight periods: trends down         Microvascular complications:  Neuropathy, hx of left foot ulcer, retinopathy  Denies: CKD  Last eye exam: Completed 06/2019   Macrovascular complications:  CAD, CHF Denies:  PVD, CVA       HISTORY:  Past Medical History:  Past Medical History:  Diagnosis Date   Adhesive capsulitis of shoulder    bilateral, Steroid injection Dr. Truman Hayward 1/12 bilaterally   CAD (coronary artery disease)    nonobstructive. Last cardiac cath (2008) showing left circumflex with mid 50% stenosis and distal luminal irregularities. Also with RCA with mid to distal 30-40% stenosis. // Previously evaluated by [facility], never followed up outpatient.   CAP (community acquired pneumonia) 06/15/2012   05/2012 CXR: Mild opacification of the posterior lung base on the lateral film  as cannot exclude infection/atelectasis.     CHF (congestive heart failure) ([place])    Diabetes mellitus 2007   Type II, insulin dependent   Diabetic retinopathy    GERD (gastroesophageal reflux disease)    Glaucoma    Hyperlipidemia    Hypertension    Obesity    Peripheral neuropathy  Past Surgical History:  Past Surgical History:  Procedure Laterality Date   ANTERIOR CERVICAL DECOMP/DISCECTOMY FUSION N/A 06/16/2016   Procedure: Anterior Cervical Decompression/discectomy Fusion - Cervical six - Cervical seven;  Surgeon: Eustace Moore, MD;  Location: [facility];  Service: Neurosurgery;  Laterality: N/A;  Anterior Cervical Decompression/discectomy Fusion - Cervical six - Cervical seven   APPENDECTOMY     CARDIAC CATHETERIZATION     CATARACT EXTRACTION     GLAUCOMA SURGERY     LEFT HEART CATH AND CORONARY ANGIOGRAPHY N/A 05/22/2018    Procedure: LEFT HEART CATH AND CORONARY ANGIOGRAPHY;  Surgeon: Troy Sine, MD;  Location: [facility];  Service: Cardiovascular;  Laterality: N/A;   LEFT HEART CATHETERIZATION WITH CORONARY ANGIOGRAM N/A 01/24/2012   Procedure: LEFT HEART CATHETERIZATION WITH CORONARY ANGIOGRAM;  Surgeon: Sherren Mocha, MD;  Location: [facility] CATH LAB;  Service: Cardiovascular;  Laterality: N/A;   POSTERIOR CERVICAL FUSION/FORAMINOTOMY N/A 10/22/2015   Procedure: Cervical three-Cervical Seven Posterior cervical fusion with lateral mass fixation,  Laminectomy Cervical Three-Cervical Seven;  Surgeon: Eustace Moore, MD;  Location: [facility];  Service: Neurosurgery;  Laterality: N/A;  Posterior   TUBAL LIGATION     Social History:  reports that she quit smoking about 14 years ago. Her smoking use included cigarettes. She has a 2.10 pack-year smoking history. She has never used smokeless tobacco. She reports that she does not drink alcohol and does not use drugs. Family History:  Family History  Problem Relation Age of Onset   Heart disease Mother    Hypertension Sister    Hypertension Sister    Hypertension Sister    Heart attack Neg Hx    Stroke Neg Hx      HOME MEDICATIONS: Allergies as of 06/04/2021   No Known Allergies      Medication List        Accurate as of June 04, 2021  4:13 PM. If you have any questions, ask your nurse or doctor.          STOP taking these medications    amoxicillin-clavulanate 875-125 MG tablet Commonly known as: AUGMENTIN Stopped by: Dorita Sciara, MD   doxycycline 100 MG tablet Commonly known as: VIBRA-TABS Stopped by: Dorita Sciara, MD   methocarbamol 500 MG tablet Commonly known as: ROBAXIN Stopped by: Dorita Sciara, MD   ranolazine 500 MG 12 hr tablet Commonly known as: RANEXA Stopped by: Dorita Sciara, MD       TAKE these medications    amitriptyline 25 MG tablet Commonly known as: ELAVIL Take 1  tablet (25 mg total) by mouth at bedtime.   azelastine 0.1 % nasal spray Commonly known as: ASTELIN azelastine 137 mcg (0.1 %) nasal spray aerosol   clopidogrel 75 MG tablet Commonly known as: PLAVIX Take 1 tablet (75 mg total) by mouth daily.   Combigan 0.2-0.5 % ophthalmic solution Generic drug: brimonidine-timolol Place 1 drop into both eyes 2 (two) times daily.   cyclobenzaprine 10 MG tablet Commonly known as: FLEXERIL TAKE 1 TABLET(10 MG) BY MOUTH TWICE DAILY AS NEEDED FOR MUSCLE SPASMS   diclofenac sodium 1 % Gel Commonly known as: VOLTAREN Apply 2 g topically 3 (three) times daily as needed.   diphenhydrAMINE 25 MG tablet Commonly known as: BENADRYL Take 1 tablet (25 mg total) by mouth every 6 (six) hours as needed for itching or allergies.   dorzolamide 2 % ophthalmic solution Commonly known as: TRUSOPT Place 1 drop into both  eyes 2 (two) times daily.   empagliflozin 25 MG Tabs tablet Commonly known as: Jardiance TAKE 1 TABLET(25 MG) BY MOUTH DAILY BEFORE AND BREAKFAST   fluticasone 50 MCG/ACT nasal spray Commonly known as: Flonase Place 1 spray into both nostrils daily.   FreeStyle Libre 14 Day Reader Kerrin Mo 1 each by Does not apply route 4 (four) times daily.   FreeStyle Libre 14 Day Sensor Misc 1 each by Does not apply route QID.   furosemide 20 MG tablet Commonly known as: LASIX Take 20 mg by mouth 2 (two) times daily.   gabapentin 300 MG capsule Commonly known as: NEURONTIN Take 300 mg by mouth 2 (two) times daily.   glucose blood test strip Commonly known as: OneTouch Verio USE TO CHECK BLOOD SUGAR 4 TIMES DAILY AS DIRECTED   Insulin Pen Needle 32G X 4 MM Misc Use to inject insulin up to 4 times daily   Insulin Pen Needle 32G X 4 MM Misc 1 Device by Does not apply route in the morning, at noon, in the evening, and at bedtime.   IRON PO Take 1 tablet by mouth 3 (three) times daily.   isosorbide mononitrate 60 MG 24 hr tablet Commonly known  as: IMDUR TAKE 1 TABLET EVERY DAY   lisinopril 20 MG tablet Commonly known as: ZESTRIL   lisinopril 2.5 MG tablet Commonly known as: ZESTRIL TAKE 1 TABLET(2.5 MG) BY MOUTH DAILY   metFORMIN 1000 MG tablet Commonly known as: GLUCOPHAGE TAKE 1 TABLET BY MOUTH TWICE DAILY WITH A MEAL   metoprolol tartrate 50 MG tablet Commonly known as: LOPRESSOR TAKE 1 AND 1/2 TABLETS TWICE DAILY   montelukast 10 MG tablet Commonly known as: SINGULAIR montelukast 10 mg tablet   nitroGLYCERIN 0.4 MG SL tablet Commonly known as: NITROSTAT DISSOLVE 1 TABLET (0.4 MG TOTAL) UNDER THE TONGUE EVERY 5 MINUTES AS NEEDED FOR CHEST PAIN.   NovoLOG FlexPen 100 UNIT/ML FlexPen Generic drug: insulin aspart Inject 14 Units into the skin 3 (three) times daily with meals.   OneTouch Delica Lancets Fine Misc Check blood sugar as instructed up to 3 times a day   oxyCODONE-acetaminophen 5-325 MG tablet Commonly known as: PERCOCET/ROXICET Take 1 tablet by mouth every 8 (eight) hours as needed for severe pain.   pantoprazole 40 MG tablet Commonly known as: PROTONIX Take 1 tablet (40 mg total) by mouth daily.   Rocklatan 0.02-0.005 % Soln Generic drug: Netarsudil-Latanoprost Rocklatan 0.02 %-0.005 % eye drops   rosuvastatin 40 MG tablet Commonly known as: CRESTOR Take 1 tablet (40 mg total) by mouth daily.   Santyl ointment Generic drug: collagenase Apply 1 application topically daily as needed (foot).   Tyler Aas FlexTouch 100 UNIT/ML FlexTouch Pen Generic drug: insulin degludec Inject 24 Units into the skin daily.   triamcinolone cream 0.1 % Commonly known as: KENALOG Apply 1 application topically as needed.   Trulicity 3 NT/6.1WE Sopn Generic drug: Dulaglutide Inject 3 mg as directed once a week.         OBJECTIVE:   Vital Signs: BP 110/64   Pulse 84   Resp 18   Wt 235 lb 6.4 oz (106.8 kg)   LMP 02/13/2011   SpO2 99%   BMI 37.99 kg/m   Wt Readings from Last 3 Encounters:   06/04/21 235 lb 6.4 oz (106.8 kg)  01/27/21 239 lb 12.8 oz (108.8 kg)  09/17/20 245 lb 6.4 oz (111.3 kg)     Exam: General: Pt appears well and is in NAD  Extremities: Trace  pretibial edema.   Neuro: MS is good with appropriate affect, pt is alert and Ox3      DM foot exam: 01/27/2021   The skin of the feet  showed callous formation of the distal right great toe , deformed right 2nd toe  She has left 2nd toe swelling, and tenderness as well as distal callous formation  The pedal pulses are undetectable The sensation is decreased  to a screening 5.07, 10 gram monofilament bilaterally       DATA REVIEWED:  Lab Results  Component Value Date   HGBA1C 12.8 (A) 06/04/2021   HGBA1C 11.3 (A) 01/27/2021   HGBA1C 9.2 (A) 07/29/2020   Lab Results  Component Value Date   MICROALBUR <0.7 06/04/2021   LDLCALC 72 03/24/2021   CREATININE 1.21 (H) 06/04/2021   Lab Results  Component Value Date   MICRALBCREAT 1.1 06/04/2021     Lab Results  Component Value Date   CHOL 159 03/24/2021   HDL 47 03/24/2021   LDLCALC 72 03/24/2021   TRIG 247 (H) 03/24/2021   CHOLHDL 3.4 03/24/2021     Latest Reference Range & Units 06/04/21 11:50  Sodium 135 - 145 mEq/L 140  Potassium 3.5 - 5.1 mEq/L 4.5  Chloride 96 - 112 mEq/L 103  CO2 19 - 32 mEq/L 27  Glucose 70 - 99 mg/dL 143 (H)  BUN 6 - 23 mg/dL 22  Creatinine 0.40 - 1.20 mg/dL 1.21 (H)  Calcium 8.4 - 10.5 mg/dL 9.8  GFR >60.00 mL/min 48.06 (L)  MICROALB/CREAT RATIO 0.0 - 30.0 mg/g 1.1  TSH 0.35 - 5.50 uIU/mL 1.71  T4,Free(Direct) 0.60 - 1.60 ng/dL 0.68  Creatinine,U mg/dL 64.9  Microalb, Ur 0.0 - 1.9 mg/dL <0.7   ASSESSMENT / PLAN / RECOMMENDATIONS:   1) Type 2 Diabetes Mellitus, Poorly controlled, With retinopathic,  neuropathic, and macrovascular  complications complications - Most recent A1c of 12.8 %. Goal A1c < 7.0 %.     - Pt continues with worsening hyperglycemia, in detailed questioning pt does admits to a period of  drinking regular sodas, she also has imprefect adherence to prandial insulin and at times would take it postprandial rather then preprandial . She had an episode of severe hypoglycemia but this is attributes to  taking prandial insulin without eating. Pt advised again to take prandial insulin within 5 minutes before she eats .  -We will consider an insulin pump in the future if no glycemic improvement - She will be provided with a correction scale  - BMP stable and MA/Cr ratio normal   MEDICATIONS: -  Increase  Tresiba to 30 units daily  - Decrease  Novolog 10 units with each meal  - Continue  Jardiance  25 mg daily - Continue Metformin 1000 mg twice daily  - Continue Trulicity  3.0  mg ONCE WEEKLY - CF : Novolog (BG -130/30)    EDUCATION / INSTRUCTIONS: BG monitoring instructions: Patient is instructed to check her blood sugars 4 times a day, before meals and bedtime  Call [facility] if: BG persistently < 70  I reviewed the Rule of 15 for the treatment of hypoglycemia in detail with the patient. Literature supplied.    2) Diabetic complications:  Eye: Does have known diabetic retinopathy.  Neuro/ Feet: Does have known diabetic peripheral neuropathy .  Renal: Patient does not have known baseline CKD. She   is on an ACEI/ARB at present.      F/U in 4 months  Signed electronically by: Mack Guise, MD  [facility]  [facility] [address] [place], [place] [address] Phone: [phone] FAX: [phone]   CC: [address] [place] [address] Phone: [phone]  Fax: [phone]  Return to Endocrinology clinic as below: Future Appointments  Date Time Provider [place]  06/09/2021  8:00 AM [facility]  10/04/2021  7:50 AM Felipe Paluch, Melanie Crazier, MD [facility] None

## 2021-06-09 ENCOUNTER — Other Ambulatory Visit: Payer: Self-pay

## 2021-06-09 ENCOUNTER — Encounter: Payer: Self-pay | Admitting: Internal Medicine

## 2021-06-09 ENCOUNTER — Other Ambulatory Visit: Payer: Medicare HMO | Admitting: *Deleted

## 2021-06-09 ENCOUNTER — Other Ambulatory Visit: Payer: Self-pay | Admitting: Internal Medicine

## 2021-06-09 DIAGNOSIS — E1142 Type 2 diabetes mellitus with diabetic polyneuropathy: Secondary | ICD-10-CM

## 2021-06-09 DIAGNOSIS — E782 Mixed hyperlipidemia: Secondary | ICD-10-CM

## 2021-06-09 DIAGNOSIS — N1831 Chronic kidney disease, stage 3a: Secondary | ICD-10-CM

## 2021-06-09 DIAGNOSIS — Z794 Long term (current) use of insulin: Secondary | ICD-10-CM

## 2021-06-09 LAB — LIPID PANEL
Chol/HDL Ratio: 2.4 ratio (ref 0.0–4.4)
Cholesterol, Total: 116 mg/dL (ref 100–199)
HDL: 49 mg/dL (ref >39)
LDL Chol Calc (NIH): 42 mg/dL (ref 0–99)
Triglycerides: 147 mg/dL (ref 0–149)
VLDL Cholesterol Cal: 25 mg/dL (ref 5–40)

## 2021-06-09 LAB — ALT: ALT: 11 IU/L (ref 0–32)

## 2021-06-16 ENCOUNTER — Other Ambulatory Visit: Payer: Self-pay | Admitting: *Deleted

## 2021-06-16 MED ORDER — LISINOPRIL 2.5 MG PO TABS: ORAL_TABLET | ORAL | 0 refills | Status: DC

## 2021-06-30 ENCOUNTER — Other Ambulatory Visit: Payer: Self-pay

## 2021-06-30 MED ORDER — TRUE METRIX LEVEL 1 LOW VI SOLN: 2 refills | Status: DC

## 2021-06-30 MED ORDER — GNP TRUE METRIX AIR METER W/DEVICE KIT: 1.0000 | PACK | Freq: Four times a day (QID) | 0 refills | Status: DC

## 2021-06-30 MED ORDER — BD SWAB SINGLE USE REGULAR PADS: MEDICATED_PAD | 2 refills | Status: DC

## 2021-06-30 MED ORDER — TRUEPLUS LANCETS 33G MISC: 11 refills | Status: AC

## 2021-07-06 ENCOUNTER — Other Ambulatory Visit: Payer: Self-pay

## 2021-07-06 MED ORDER — TRESIBA FLEXTOUCH 100 UNIT/ML ~~LOC~~ SOPN: 24.0000 [IU] | PEN_INJECTOR | Freq: Every day | SUBCUTANEOUS | 3 refills | Status: DC

## 2021-07-06 MED ORDER — NOVOLOG FLEXPEN 100 UNIT/ML ~~LOC~~ SOPN: 14.0000 [IU] | PEN_INJECTOR | Freq: Three times a day (TID) | SUBCUTANEOUS | 3 refills | Status: DC

## 2021-07-06 NOTE — Telephone Encounter (Signed)
Script sent  

## 2021-07-07 ENCOUNTER — Other Ambulatory Visit: Payer: Self-pay

## 2021-07-07 MED ORDER — GNP TRUETRACK TEST STRIPS VI STRP: ORAL_STRIP | 12 refills | Status: DC

## 2021-07-07 NOTE — Telephone Encounter (Signed)
Script sent  

## 2021-07-08 ENCOUNTER — Other Ambulatory Visit: Payer: Self-pay

## 2021-07-08 MED ORDER — TRUE METRIX BLOOD GLUCOSE TEST VI STRP: ORAL_STRIP | 12 refills | Status: DC

## 2021-07-08 NOTE — Telephone Encounter (Signed)
Test strips sent to [facility]

## 2021-08-02 ENCOUNTER — Other Ambulatory Visit: Payer: Self-pay | Admitting: Podiatry

## 2021-08-02 ENCOUNTER — Telehealth: Payer: Self-pay | Admitting: *Deleted

## 2021-08-02 MED ORDER — DOXYCYCLINE HYCLATE 100 MG PO TABS: 100.0000 mg | ORAL_TABLET | Freq: Two times a day (BID) | ORAL | 0 refills | Status: DC

## 2021-08-02 NOTE — Telephone Encounter (Signed)
Patient is calling because her left toe may be infected,unable to bear weight on it . Please schedule for sooner appointment.

## 2021-08-02 NOTE — Progress Notes (Signed)
Doxycycline sent

## 2021-08-05 ENCOUNTER — Ambulatory Visit (INDEPENDENT_AMBULATORY_CARE_PROVIDER_SITE_OTHER): Payer: Medicare HMO

## 2021-08-05 ENCOUNTER — Ambulatory Visit (INDEPENDENT_AMBULATORY_CARE_PROVIDER_SITE_OTHER): Payer: Medicare HMO | Admitting: Podiatry

## 2021-08-05 ENCOUNTER — Other Ambulatory Visit: Payer: Self-pay

## 2021-08-05 DIAGNOSIS — L97512 Non-pressure chronic ulcer of other part of right foot with fat layer exposed: Secondary | ICD-10-CM | POA: Diagnosis not present

## 2021-08-05 DIAGNOSIS — M86172 Other acute osteomyelitis, left ankle and foot: Secondary | ICD-10-CM

## 2021-08-05 MED ORDER — CIPROFLOXACIN HCL 500 MG PO TABS: 500.0000 mg | ORAL_TABLET | Freq: Two times a day (BID) | ORAL | 0 refills | Status: DC

## 2021-08-06 ENCOUNTER — Telehealth: Payer: Self-pay | Admitting: *Deleted

## 2021-08-06 ENCOUNTER — Other Ambulatory Visit: Payer: Self-pay | Admitting: Podiatry

## 2021-08-06 NOTE — Telephone Encounter (Signed)
Patient called - had bloodwork done yesterday. "Where do I get the MRI done at?" Please advise.

## 2021-08-06 NOTE — Telephone Encounter (Signed)
Returned call - informed patient the order was sent to [facility] and they would be calling her to schedule.

## 2021-08-07 ENCOUNTER — Other Ambulatory Visit: Payer: Self-pay

## 2021-08-07 ENCOUNTER — Ambulatory Visit
Admission: RE | Admit: 2021-08-07 | Discharge: 2021-08-07 | Disposition: A | Discharge status: Home / Self Care | Payer: Medicare HMO | Source: Ambulatory Visit | Attending: Podiatry | Admitting: Podiatry

## 2021-08-07 DIAGNOSIS — M86172 Other acute osteomyelitis, left ankle and foot: Secondary | ICD-10-CM

## 2021-08-07 LAB — CBC WITH DIFFERENTIAL/PLATELET
Absolute Monocytes: 564 cells/uL (ref 200–950)
Basophils Absolute: 35 cells/uL (ref 0–200)
Basophils Relative: 0.3 %
Eosinophils Absolute: 115 cells/uL (ref 15–500)
Eosinophils Relative: 1 %
HCT: 35.8 % (ref 35.0–45.0)
Hemoglobin: 11.5 g/dL — ABNORMAL LOW (ref 11.7–15.5)
Lymphs Abs: 3335 cells/uL (ref 850–3900)
MCH: 29.1 pg (ref 27.0–33.0)
MCHC: 32.1 g/dL (ref 32.0–36.0)
MCV: 90.6 fL (ref 80.0–100.0)
MPV: 11.5 fL (ref 7.5–12.5)
Monocytes Relative: 4.9 %
Neutro Abs: 7452 cells/uL (ref 1500–7800)
Neutrophils Relative %: 64.8 %
Platelets: 249 10*3/uL (ref 140–400)
RBC: 3.95 10*6/uL (ref 3.80–5.10)
RDW: 13.1 % (ref 11.0–15.0)
Total Lymphocyte: 29 %
WBC: 11.5 10*3/uL — ABNORMAL HIGH (ref 3.8–10.8)

## 2021-08-07 LAB — BASIC METABOLIC PANEL
BUN/Creatinine Ratio: 24 (calc) — ABNORMAL HIGH (ref 6–22)
BUN: 33 mg/dL — ABNORMAL HIGH (ref 7–25)
CO2: 27 mmol/L (ref 20–32)
Calcium: 9.9 mg/dL (ref 8.6–10.4)
Chloride: 102 mmol/L (ref 98–110)
Creat: 1.37 mg/dL — ABNORMAL HIGH (ref 0.50–1.05)
Glucose, Bld: 210 mg/dL — ABNORMAL HIGH (ref 65–99)
Potassium: 4.6 mmol/L (ref 3.5–5.3)
Sodium: 137 mmol/L (ref 135–146)

## 2021-08-07 LAB — C-REACTIVE PROTEIN: CRP: 13.5 mg/L — ABNORMAL HIGH (ref <8.0)

## 2021-08-07 LAB — SEDIMENTATION RATE: Sed Rate: 38 mm/h — ABNORMAL HIGH (ref 0–30)

## 2021-08-09 ENCOUNTER — Telehealth: Payer: Self-pay | Admitting: *Deleted

## 2021-08-09 NOTE — Telephone Encounter (Signed)
Patient is calling for MRI results. Please advise.

## 2021-08-10 ENCOUNTER — Ambulatory Visit: Payer: Medicare HMO | Admitting: Podiatry

## 2021-08-10 NOTE — Progress Notes (Signed)
Subjective: 61 year old female presents the office today for concerns of a painful area on her let 2nd toe.  She said that she gets a hard callus at the tip of the toes causing discomfort.  She is on other toes as well with the left second toe, symptomatic.  Patient did call I started her on doxycycline.  She presents today for evaluation.  She denies any fevers or chills.  Last A1c was 12.8 in June 04, 2021  Objective: AAO x3, NAD Thick hyperkeratotic lesion noted distal aspect of left second toe which is quite tender to touch.  After anesthetized the toe was able to sharply debride the hyperkeratotic lesion to reveal granular wound at the distal portion of the toe.  See picture below.  Does probe close to bone there is no exposed bone or tendon.  Localized edema and erythema to the toe.  There is no fluctuation or crepitation. Hyperkeratotic lesion noted on the right second toe as well as left hallux without any underlying ulceration or signs of infection but the area is preulcerative. No pain with calf compression, swelling, warmth, erythema       Assessment: Left second toe ulceration with concern for osteomyelitis; preulcerative lesions  Plan: -All treatment options discussed with the patient including all alternatives, risks, complications.  -X-rays obtained reviewed.  Likely cortical changes noted at the distal phalanx of the left second toe concerning for osteomyelitis.  No soft tissue emphysema. -Given the discomfort I did anesthetized the toe with 3 cc lidocaine, Marcaine plain.  Once anesthetized I was able to sharply debride the wound approximately 312 scalpel down to granular tissue remove any nonviable devitalized tissue.  There was no blood loss and hemostasis achieved with manual compression.  The wound is probably close to the bone.  I cleansed the wound with saline.  Small area of Betadine was applied followed by dressing. -Continue doxycycline.  Also added ciprofloxacin  to it. -Blood work ordered including CBC, BMP, sed rate, CRP -MRI left toes ordered to evaluate for osteomyelitis -Surgical shoe -Discussed with her likely need for at least partial toe amputation given concern for osteomyelitis but will await the results of the MRI. -Glucose control -Sharply debrided hyperkeratotic lesions to the left hallux and right toe without complication  -She is to monitor very closely signs or symptoms of worsening infection report to the emergency department immediately should any occur.  40 minutes were spent with the patient greater than 50% time was in face-to-face contact with cleaning the wound and discussing treatment options as well as possible surgical care.  Return in about 1 week (around 08/12/2021).  Trula Slade DPM

## 2021-08-12 ENCOUNTER — Ambulatory Visit (INDEPENDENT_AMBULATORY_CARE_PROVIDER_SITE_OTHER): Payer: Medicare HMO | Admitting: Podiatry

## 2021-08-12 ENCOUNTER — Ambulatory Visit (INDEPENDENT_AMBULATORY_CARE_PROVIDER_SITE_OTHER): Payer: Medicare HMO

## 2021-08-12 ENCOUNTER — Other Ambulatory Visit: Payer: Self-pay | Admitting: Podiatry

## 2021-08-12 ENCOUNTER — Ambulatory Visit: Payer: Medicare HMO

## 2021-08-12 ENCOUNTER — Other Ambulatory Visit: Payer: Self-pay

## 2021-08-12 DIAGNOSIS — L97522 Non-pressure chronic ulcer of other part of left foot with fat layer exposed: Secondary | ICD-10-CM | POA: Diagnosis not present

## 2021-08-12 DIAGNOSIS — L97512 Non-pressure chronic ulcer of other part of right foot with fat layer exposed: Secondary | ICD-10-CM

## 2021-08-12 DIAGNOSIS — L97521 Non-pressure chronic ulcer of other part of left foot limited to breakdown of skin: Secondary | ICD-10-CM | POA: Diagnosis not present

## 2021-08-12 DIAGNOSIS — M79672 Pain in left foot: Secondary | ICD-10-CM

## 2021-08-12 DIAGNOSIS — M869 Osteomyelitis, unspecified: Secondary | ICD-10-CM | POA: Diagnosis not present

## 2021-08-12 MED ORDER — DOXYCYCLINE HYCLATE 100 MG PO TABS: 100.0000 mg | ORAL_TABLET | Freq: Two times a day (BID) | ORAL | 0 refills | Status: DC

## 2021-08-12 NOTE — Progress Notes (Signed)
Subjective: 61 year old female presents the office today for follow-up evaluation of a wound to the distal aspect left second toe as well discussed MRI results.  She states that the toe is doing better.  Pain is much better improved compared to what it was last appointment.  She has not seen any drainage or pus.  She still on antibiotics, doxycycline.  She states that she does not want amputation of the toe.  She denies any fevers or chills.  She has no other concerns today.  Last A1c was 12.8 in June 04, 2021  Objective: AAO x3, NAD DP, PT pulses palpable Sensation decreased in some sensory monofilament. Granular wound present distal aspect of left second toe.  There is no exposed bone or tendon.  There is still localized edema to the distal portion of toe with discoloration at the distal portion of the toe as well.  There is no fluctuation or crepitation.  There is no malodor. No pain with calf compression, swelling, warmth, erythema            Assessment: Left second toe ulceration with concern for osteomyelitis  Plan: -All treatment options discussed with the patient including all alternatives, risks, complications.  -Repeat x-rays obtained reviewed.  Appears to be mostly unchanged compared to x-rays last week.  Still concern for osteomyelitis of the distal phalanx distally. -Reviewed the MRI with her which shows possible osteomyelitis of the distal phalanx of the second toe. -Given the wound as well as the appearance I discussed with her still high likelihood of osteomyelitis in the toe.  Reviewed the blood work that was done last week as well with her which is elevated.  I discussed with her trying to save the toe versus amputation.  My recommendation would be to at least do partial toe amputation however she is not yet ready to proceed with this.  Discussed need to keep a very close monitoring of this and should there be any signs or symptoms of worsening infection she needs  support directed to the emergency department.  I am going to go ahead and recheck blood work today to trend the CBC, CRP, sed rate.  Continue surgical shoe, offloading.  Continue antibiotics.  Discussed risks of prolonged antibiotics. -After reviewing the chart I am going to order updated ABI as well.  *40 minutes were spent with the patient greater than 50% of time was in face-to-face contact.  I spent time discussing the MRI, x-rays and concern for bone infection and the treatment options for this.  Return in about 1 week (around 08/19/2021).  Trula Slade DPM

## 2021-08-16 ENCOUNTER — Ambulatory Visit (HOSPITAL_COMMUNITY)
Admission: RE | Admit: 2021-08-16 | Discharge: 2021-08-16 | Disposition: A | Discharge status: Home / Self Care | Payer: Medicare HMO | Source: Ambulatory Visit | Attending: Podiatry | Admitting: Podiatry

## 2021-08-16 ENCOUNTER — Other Ambulatory Visit: Payer: Self-pay

## 2021-08-16 ENCOUNTER — Telehealth: Payer: Self-pay | Admitting: *Deleted

## 2021-08-16 DIAGNOSIS — M869 Osteomyelitis, unspecified: Secondary | ICD-10-CM | POA: Insufficient documentation

## 2021-08-16 DIAGNOSIS — L97522 Non-pressure chronic ulcer of other part of left foot with fat layer exposed: Secondary | ICD-10-CM

## 2021-08-16 NOTE — Telephone Encounter (Signed)
Patient is calling for results of VS [place] that was done today, please advise,results in epic to view.

## 2021-08-17 LAB — CBC WITH DIFFERENTIAL/PLATELET
Absolute Monocytes: 510 cells/uL (ref 200–950)
Basophils Absolute: 29 cells/uL (ref 0–200)
Basophils Relative: 0.3 %
Eosinophils Absolute: 69 cells/uL (ref 15–500)
Eosinophils Relative: 0.7 %
HCT: 37.6 % (ref 35.0–45.0)
Hemoglobin: 11.8 g/dL (ref 11.7–15.5)
Lymphs Abs: 3097 cells/uL (ref 850–3900)
MCH: 28.5 pg (ref 27.0–33.0)
MCHC: 31.4 g/dL — ABNORMAL LOW (ref 32.0–36.0)
MCV: 90.8 fL (ref 80.0–100.0)
MPV: 11.7 fL (ref 7.5–12.5)
Monocytes Relative: 5.2 %
Neutro Abs: 6096 cells/uL (ref 1500–7800)
Neutrophils Relative %: 62.2 %
Platelets: 227 10*3/uL (ref 140–400)
RBC: 4.14 10*6/uL (ref 3.80–5.10)
RDW: 13 % (ref 11.0–15.0)
Total Lymphocyte: 31.6 %
WBC: 9.8 10*3/uL (ref 3.8–10.8)

## 2021-08-17 LAB — BASIC METABOLIC PANEL
BUN/Creatinine Ratio: 23 (calc) — ABNORMAL HIGH (ref 6–22)
BUN: 35 mg/dL — ABNORMAL HIGH (ref 7–25)
CO2: 26 mmol/L (ref 20–32)
Calcium: 9.6 mg/dL (ref 8.6–10.4)
Chloride: 105 mmol/L (ref 98–110)
Creat: 1.55 mg/dL — ABNORMAL HIGH (ref 0.50–1.05)
Glucose, Bld: 314 mg/dL — ABNORMAL HIGH (ref 65–99)
Potassium: 4.3 mmol/L (ref 3.5–5.3)
Sodium: 139 mmol/L (ref 135–146)

## 2021-08-17 LAB — SEDIMENTATION RATE: Sed Rate: 46 mm/h — ABNORMAL HIGH (ref 0–30)

## 2021-08-17 LAB — C-REACTIVE PROTEIN: CRP: 9.7 mg/L — ABNORMAL HIGH (ref <8.0)

## 2021-08-19 ENCOUNTER — Ambulatory Visit (INDEPENDENT_AMBULATORY_CARE_PROVIDER_SITE_OTHER): Payer: Medicare HMO

## 2021-08-19 ENCOUNTER — Other Ambulatory Visit: Payer: Self-pay

## 2021-08-19 ENCOUNTER — Ambulatory Visit (INDEPENDENT_AMBULATORY_CARE_PROVIDER_SITE_OTHER): Payer: Medicare HMO | Admitting: Podiatry

## 2021-08-19 DIAGNOSIS — M869 Osteomyelitis, unspecified: Secondary | ICD-10-CM | POA: Diagnosis not present

## 2021-08-19 DIAGNOSIS — L97522 Non-pressure chronic ulcer of other part of left foot with fat layer exposed: Secondary | ICD-10-CM | POA: Diagnosis not present

## 2021-08-19 MED ORDER — CIPROFLOXACIN HCL 500 MG PO TABS: 500.0000 mg | ORAL_TABLET | Freq: Two times a day (BID) | ORAL | 0 refills | Status: DC

## 2021-08-22 NOTE — Progress Notes (Signed)
Subjective: 61 year old female presents the office today for follow-up evaluation of a wound to the distal aspect left second toe.she feels that the toe is doing better.  She has not seen any drainage or pus there is been no significant increase in swelling.  She still on antibiotics.  She still gets discolored. She has noticed discoloration of the tip of the toe.  This has not been worsening.  She denies any fevers or chills.    Last A1c was 12.8 in June 04, 2021  Objective: AAO x3, NAD DP, PT pulses palpable Sensation decreased in some sensory monofilament. Granular wound present distal aspect of left second toe.  The wound is smaller today there is hyperkeratotic tissue present.  There is still localized edema and dark discoloration present distal portion of toe.  There is no drainage or pus.  No probing, undermining or tunneling.  There is no fluctuation or crepitation.  There is no malodor. No pain with calf compression, swelling, warmth, erythema         Assessment: Left second toe ulceration with concern for osteomyelitis  Plan: -All treatment options discussed with the patient including all alternatives, risks, complications.  -Repeat x-rays obtained reviewed.  Appears to be mostly unchanged compared to x-rays last week.  -Reviewed blood work.  CRP down to 9.7, white blood cell count 9.8.  Creatinine 1.55.  Sed rate did increase to 46. -At this point x-rays did not significantly change and the wound is improving.  I sharply debrided the callus, wound today utilizing #312 with scalpel any complications.  As the wound is improving. For now continue conservative management for now in order to hopefully save the toe but she understands that she is at risk of amputation but she wants to avoid that for now and does not want proceed with amputation as well. -Continue antibiotics.  Discussed risks of prolonged antibiotics. -After reviewing the chart I am going to order updated ABI as  well.  Return in about 1 week (around 08/26/2021).  Repeat blood work next appointment to trend labs.  Trula Slade DPM

## 2021-08-24 ENCOUNTER — Other Ambulatory Visit: Payer: Self-pay | Admitting: Podiatry

## 2021-08-24 DIAGNOSIS — L97522 Non-pressure chronic ulcer of other part of left foot with fat layer exposed: Secondary | ICD-10-CM

## 2021-08-27 ENCOUNTER — Ambulatory Visit (INDEPENDENT_AMBULATORY_CARE_PROVIDER_SITE_OTHER): Payer: Medicare Other

## 2021-08-27 ENCOUNTER — Other Ambulatory Visit: Payer: Self-pay

## 2021-08-27 ENCOUNTER — Ambulatory Visit (INDEPENDENT_AMBULATORY_CARE_PROVIDER_SITE_OTHER): Payer: Medicare Other | Admitting: Podiatry

## 2021-08-27 DIAGNOSIS — L97522 Non-pressure chronic ulcer of other part of left foot with fat layer exposed: Secondary | ICD-10-CM | POA: Diagnosis not present

## 2021-08-27 DIAGNOSIS — M869 Osteomyelitis, unspecified: Secondary | ICD-10-CM

## 2021-08-29 ENCOUNTER — Other Ambulatory Visit: Payer: Self-pay | Admitting: Podiatry

## 2021-09-01 LAB — CBC WITH DIFFERENTIAL/PLATELET
Absolute Monocytes: 580 cells/uL (ref 200–950)
Basophils Absolute: 50 cells/uL (ref 0–200)
Basophils Relative: 0.5 %
Eosinophils Absolute: 50 cells/uL (ref 15–500)
Eosinophils Relative: 0.5 %
HCT: 35.7 % (ref 35.0–45.0)
Hemoglobin: 11.3 g/dL — ABNORMAL LOW (ref 11.7–15.5)
Lymphs Abs: 2690 cells/uL (ref 850–3900)
MCH: 28.7 pg (ref 27.0–33.0)
MCHC: 31.7 g/dL — ABNORMAL LOW (ref 32.0–36.0)
MCV: 90.6 fL (ref 80.0–100.0)
MPV: 11.6 fL (ref 7.5–12.5)
Monocytes Relative: 5.8 %
Neutro Abs: 6630 cells/uL (ref 1500–7800)
Neutrophils Relative %: 66.3 %
Platelets: 252 10*3/uL (ref 140–400)
RBC: 3.94 10*6/uL (ref 3.80–5.10)
RDW: 12.8 % (ref 11.0–15.0)
Total Lymphocyte: 26.9 %
WBC: 10 10*3/uL (ref 3.8–10.8)

## 2021-09-01 LAB — BASIC METABOLIC PANEL
BUN/Creatinine Ratio: 20 (calc) (ref 6–22)
BUN: 33 mg/dL — ABNORMAL HIGH (ref 7–25)
CO2: 27 mmol/L (ref 20–32)
Calcium: 9.7 mg/dL (ref 8.6–10.4)
Chloride: 103 mmol/L (ref 98–110)
Creat: 1.66 mg/dL — ABNORMAL HIGH (ref 0.50–1.05)
Glucose, Bld: 236 mg/dL — ABNORMAL HIGH (ref 65–99)
Potassium: 4.5 mmol/L (ref 3.5–5.3)
Sodium: 139 mmol/L (ref 135–146)

## 2021-09-01 LAB — SEDIMENTATION RATE: Sed Rate: 51 mm/h — ABNORMAL HIGH (ref 0–30)

## 2021-09-01 LAB — C-REACTIVE PROTEIN: CRP: 24.7 mg/L — ABNORMAL HIGH (ref <8.0)

## 2021-09-03 ENCOUNTER — Ambulatory Visit (INDEPENDENT_AMBULATORY_CARE_PROVIDER_SITE_OTHER): Payer: Medicare Other | Admitting: Podiatry

## 2021-09-03 ENCOUNTER — Telehealth: Payer: Self-pay

## 2021-09-03 ENCOUNTER — Other Ambulatory Visit: Payer: Self-pay

## 2021-09-03 DIAGNOSIS — M869 Osteomyelitis, unspecified: Secondary | ICD-10-CM

## 2021-09-03 DIAGNOSIS — L97522 Non-pressure chronic ulcer of other part of left foot with fat layer exposed: Secondary | ICD-10-CM

## 2021-09-03 NOTE — Patient Instructions (Signed)

## 2021-09-03 NOTE — Telephone Encounter (Signed)
DOS 09/08/2021 ? ?AMPUTATION TOE IPJ 2ND LT - 67341 ? ?[employer] MEDICARE EFFECTIVE DATE - 08/25/2021 ? ?PLAN DEDUCTIBLE - $0.00 ?OUT OF POCKET - $8300.00 W/ $8257.69 REMAINING ? ?CO-INSURANCE ?20% / Day OUTPATIENT SURGERY ?20% / [place] ?COPAY ?$0 / Day OUTPATIENT SURGERY ?$89 / [place] ? ? ?Notification or Prior Authorization is not required for the requested services ? ?Decision ID #:[id number] ?

## 2021-09-05 NOTE — Progress Notes (Signed)
Subjective: ?61 year old female presents the office today for follow-up evaluation of a wound to the distal aspect left second toe.  She said that she still has some swelling discoloration of the tip of the toe but she has not seen any drainage or pus.  She has no pain.  She denies any fevers or chills.  She has no new concerns today.  ? ?Last A1c was 12.8 in June 04, 2021 ? ?Objective: ?AAO x3, NAD ?DP, PT pulses palpable ?Sensation decreased in some sensory monofilament. ?Granular wound present distal aspect of left second toe.  Hyperkeratotic periwound.  Upon debridement the wound is still present and there is still discoloration, swelling to the distal portion of toe but there is no drainage or pus noted today.  There is no ascending cellulitis.  No fluctuation or crepitation.  There is no malodor. ?No pain with calf compression, swelling, warmth, erythema ? ? ? ? ?Assessment: ?Left second toe ulceration with concern for osteomyelitis ? ?Plan: ?-All treatment options discussed with the patient including all alternatives, risks, complications.  ?-Repeat x-rays obtained reviewed.  Hammertoe contractures limiting evaluation.  There is no soft tissue edema.  Some mild edema at the distal portion of toe but no significant osseous destruction. ?-We given a long discussion regards to the treatment options with conservative as well as surgical.  Given the ongoing wound, discoloration, swelling to the tip of the toe and still concern for underlying osteomyelitis.  We will repeat the blood work and if the blood work is not improving discussed with her at least partial toe potation.  She is amenable to that at this time. ?-Sharply debrided the callus, wound to any complications or bleeding.  Wound itself is doing somewhat better but still concerned about underlying infection. ?-Continue antibiotics and we discussed effects of long-term antibiotics today. ?-Reviewed arterial studies and encourage glucose control to help  with healing. ?-Monitor for any clinical signs or symptoms of infection and directed to call the office immediately should any occur or go to the ER. ? ?Return in about 1 week (around 09/03/2021). ? ?Trula Slade DPM ?

## 2021-09-08 ENCOUNTER — Telehealth: Payer: Self-pay

## 2021-09-08 ENCOUNTER — Other Ambulatory Visit: Payer: Self-pay | Admitting: Podiatry

## 2021-09-08 DIAGNOSIS — M86672 Other chronic osteomyelitis, left ankle and foot: Secondary | ICD-10-CM | POA: Diagnosis not present

## 2021-09-08 MED ORDER — HYDROCODONE-ACETAMINOPHEN 5-325 MG PO TABS: 1.0000 | ORAL_TABLET | Freq: Four times a day (QID) | ORAL | 0 refills | Status: DC | PRN

## 2021-09-08 MED ORDER — ONDANSETRON HCL 4 MG PO TABS: 4.0000 mg | ORAL_TABLET | Freq: Three times a day (TID) | ORAL | 0 refills | Status: DC | PRN

## 2021-09-08 NOTE — Progress Notes (Signed)
Postop medications sent 

## 2021-09-08 NOTE — Telephone Encounter (Signed)
Drake from [facility] called and stated that the patient is currently on Oxycodone from another provider and they will not fill the hydrocodone for her.  Just FYI ?

## 2021-09-08 NOTE — Progress Notes (Signed)
Subjective: ?61 year old female presents the office today for follow-up evaluation of a wound to the distal aspect left second toe.  She states the wound is still present she has not seen any drainage or pus coming from the area no fevers or chills.  She still notices swelling discoloration at the tip of the toe as well.  Denies any fevers, chills, nausea, vomiting.  ? ?Last A1c was 12.8 in June 04, 2021 ? ?Objective: ?AAO x3, NAD ?DP, PT pulses palpable ?Sensation decreased in some sensory monofilament. ?Granular wound present distal aspect of left second toe.  Mild hyperkeratotic periwound.  Upon debridement the wound is still present and there is still fractured discoloration, swelling to the distal portion of toe but there is no drainage or pus noted today.  There is no ascending cellulitis.  No fluctuation or crepitation.  There is no malodor. ?No pain with calf compression, swelling, warmth, erythema ? ? ?Assessment: ?Left second toe ulceration with concern for osteomyelitis ? ?Plan: ?-All treatment options discussed with the patient including all alternatives, risks, complications.  ?-Reviewed the blood work with the patient.  The CRP is elevated to 24.7.  White blood cell count 10, creatinine 1.66, sed rate is increased to 51. ?-Given increase of CRP, sed rate and blood work as well as clinically there is still edema, hyperpigmentation of the distal portion of toe of the wound still present concern for osteomyelitis I recommended at least partial amputation of the digit.  After discussion with this and as well as the patient's daughter who are brought into the room they are all in agreement to proceed with surgery.  We will plan on likely partial second toe amputation next week. ?-The incision placement as well as the postoperative course was discussed with the patient. I discussed risks of the surgery which include, but not limited to, infection, bleeding, pain, swelling, need for further surgery,  delayed or nonhealing, painful or ugly scar, numbness or sensation changes, over/under correction, recurrence, transfer lesions, further deformity, DVT/PE, loss of toe/foot. Patient understands these risks and wishes to proceed with surgery. The surgical consent was reviewed with the patient all 3 pages were signed. No promises or guarantees were given to the outcome of the procedure. All questions were answered to the best of my ability. Before the surgery the patient was encouraged to call the office if there is any further questions. The surgery will be performed at the [facility] on an outpatient basis. ?-In the meantime need to monitor closely for signs or symptoms of infection report to emergency room for any occur. ? ?Trula Slade DPM ?

## 2021-09-13 ENCOUNTER — Ambulatory Visit (INDEPENDENT_AMBULATORY_CARE_PROVIDER_SITE_OTHER): Payer: Medicare Other

## 2021-09-13 ENCOUNTER — Ambulatory Visit (INDEPENDENT_AMBULATORY_CARE_PROVIDER_SITE_OTHER): Payer: Medicare Other | Admitting: Podiatry

## 2021-09-13 ENCOUNTER — Other Ambulatory Visit: Payer: Self-pay

## 2021-09-13 DIAGNOSIS — Z9889 Other specified postprocedural states: Secondary | ICD-10-CM

## 2021-09-13 DIAGNOSIS — M869 Osteomyelitis, unspecified: Secondary | ICD-10-CM

## 2021-09-20 NOTE — Progress Notes (Signed)
Subjective: ?[name] is a 61 y.o. is seen today in office s/p left second digit partial amputation preformed on 09/08/2021.  States that she has been doing well.  She has some mild discomfort.  She denies any fevers, chills, nausea, vomiting.  No chest pain or shortness of breath.  Has been wearing surgical shoe.  No other concerns. ? ?Objective: ?General: No acute distress, AAOx3  ?DP/PT pulses palpable 2/4, CRT < 3 sec to all digits.  ?Left foot: Incision is well coapted without any evidence of dehiscence with sutures intact. There is no surrounding erythema, ascending cellulitis, fluctuance, crepitus, malodor, drainage/purulence. There is mild edema around the surgical site. There is no significant pain along the surgical site.  ?No other areas of tenderness to bilateral lower extremities.  ?No other open lesions or pre-ulcerative lesions.  ?No pain with calf compression, swelling, warmth, erythema.  ? ?Assessment and Plan:  ?Status post left second digit partial application, doing well with no complications  ? ?-Treatment options discussed including all alternatives, risks, and complications ?-X-rays obtained reviewed.  No evidence of acute fracture.  No evidence of acute osteomyelitis noted. ?-Incision was cleaned.  Antibiotic ointment and bandage applied.  Keep the dressing clean, dry, intact. ?-Continue surgical shoe, offloading and elevation ?-Pain medication as needed. ?-Monitor for any clinical signs or symptoms of infection and DVT/PE and directed to call the office immediately should any occur or go to the ER. ?-Follow-up as scheduled for possible suture removal or sooner if any problems arise. In the meantime, encouraged to call the office with any questions, concerns, change in symptoms.  ? ?Celesta Gentile, DPM ? ?

## 2021-09-23 ENCOUNTER — Encounter: Payer: Self-pay | Admitting: Podiatry

## 2021-09-23 ENCOUNTER — Ambulatory Visit (INDEPENDENT_AMBULATORY_CARE_PROVIDER_SITE_OTHER): Payer: Medicare Other | Admitting: Podiatry

## 2021-09-23 ENCOUNTER — Ambulatory Visit (INDEPENDENT_AMBULATORY_CARE_PROVIDER_SITE_OTHER): Payer: Medicare Other

## 2021-09-23 DIAGNOSIS — L84 Corns and callosities: Secondary | ICD-10-CM

## 2021-09-23 DIAGNOSIS — L97512 Non-pressure chronic ulcer of other part of right foot with fat layer exposed: Secondary | ICD-10-CM | POA: Diagnosis not present

## 2021-09-23 DIAGNOSIS — M2042 Other hammer toe(s) (acquired), left foot: Secondary | ICD-10-CM

## 2021-09-23 DIAGNOSIS — M2041 Other hammer toe(s) (acquired), right foot: Secondary | ICD-10-CM

## 2021-09-23 MED ORDER — MUPIROCIN 2 % EX OINT: 1.0000 "application " | TOPICAL_OINTMENT | Freq: Two times a day (BID) | CUTANEOUS | 2 refills | Status: DC

## 2021-09-28 NOTE — Progress Notes (Signed)
Subjective: ?[name] is a 61 y.o. is seen today in office s/p left second digit partial amputation preformed on 09/08/2021.  She states this toe is doing well.  She has had pain on her right third toe and the callus that she has had previously has become thick causing pain.  Denies any swelling or redness or any drainage to the right side.  No new ulcerations are noted.  Denies any fevers or chills.  No other concerns.   ? ?Objective: ?General: No acute distress, AAOx3  ?DP/PT pulses palpable 2/4, CRT < 3 sec to all digits.  ?Left foot: Incision is well coapted without any evidence of dehiscence with sutures intact.  There is no surrounding erythema, ascending cellulitis.  No drainage or pus or any signs of infection.  There is no malodor. ?Right foot: hyperkeratotic lesion to the distal aspect of right third toe and upon debridement there is no underlying ulceration drainage or any signs of infection. ?No pain with calf compression, swelling, warmth, erythema.  ? ?Assessment and Plan:  ?Status post left second digit partial application, doing well with no complications; hyperkeratotic lesion right third toe with hammertoe deformity ? ?-Treatment options discussed including all alternatives, risks, and complications ?-X-rays obtained reviewed of the right foot and 3 views were obtained.  No evidence of acute fracture or osseous destruction suggestive of osteomyelitis. ?-RIGHT foot sharply debrided the hyperkeratotic lesion with any complications or bleeding.  The area is preulcerative but there is no ulceration at this time.  No signs of infection need to monitor this closely.  Continue offloading for the hammertoes.  Daily foot inspection glucose control. ?-LEFT foot: Today remove the sutures without any complications incisions healing well.  Small amount of antibiotic ointment and a bandage was applied.  Discussed that she can wash it with soap and water and apply a similar bandage.  Continue surgical  shoe, offloading. ?-Monitor for any clinical signs or symptoms of infection and DVT/PE and directed to call the office immediately should any occur or go to the ER. ? ?Return in about 2 weeks (around 10/07/2021). ? ?Celesta Gentile, DPM ? ?

## 2021-09-30 ENCOUNTER — Encounter: Payer: Self-pay | Admitting: Podiatry

## 2021-10-04 ENCOUNTER — Ambulatory Visit (INDEPENDENT_AMBULATORY_CARE_PROVIDER_SITE_OTHER): Payer: Medicare Other | Admitting: Internal Medicine

## 2021-10-04 ENCOUNTER — Encounter: Payer: Self-pay | Admitting: Internal Medicine

## 2021-10-04 VITALS — BP 122/80 | HR 90 | Ht 66.0 in | Wt 226.0 lb

## 2021-10-04 DIAGNOSIS — N1831 Chronic kidney disease, stage 3a: Secondary | ICD-10-CM

## 2021-10-04 DIAGNOSIS — E1159 Type 2 diabetes mellitus with other circulatory complications: Secondary | ICD-10-CM

## 2021-10-04 DIAGNOSIS — E1122 Type 2 diabetes mellitus with diabetic chronic kidney disease: Secondary | ICD-10-CM | POA: Diagnosis not present

## 2021-10-04 DIAGNOSIS — E11319 Type 2 diabetes mellitus with unspecified diabetic retinopathy without macular edema: Secondary | ICD-10-CM

## 2021-10-04 DIAGNOSIS — E1142 Type 2 diabetes mellitus with diabetic polyneuropathy: Secondary | ICD-10-CM

## 2021-10-04 DIAGNOSIS — R739 Hyperglycemia, unspecified: Secondary | ICD-10-CM | POA: Diagnosis not present

## 2021-10-04 DIAGNOSIS — Z794 Long term (current) use of insulin: Secondary | ICD-10-CM

## 2021-10-04 LAB — POCT GLYCOSYLATED HEMOGLOBIN (HGB A1C): Hemoglobin A1C: 11.3 % — AB (ref 4.0–5.6)

## 2021-10-04 MED ORDER — TRESIBA FLEXTOUCH 100 UNIT/ML ~~LOC~~ SOPN: 30.0000 [IU] | PEN_INJECTOR | Freq: Every day | SUBCUTANEOUS | 3 refills | Status: DC

## 2021-10-04 MED ORDER — DEXCOM G6 TRANSMITTER MISC: 1.0000 | 3 refills | Status: DC

## 2021-10-04 MED ORDER — DEXCOM G6 SENSOR MISC: 1.0000 | 3 refills | Status: DC

## 2021-10-04 NOTE — Patient Instructions (Addendum)
-   Increase  Tresiba to 30 units daily  ?- Continue  Jardiance  25 mg daily ?- Continue Metformin 1000 mg twice daily  ?- Continue  Trulicity 3.0  mg ONCE WEEKLY  ?-Novolog correctional insulin:  Use the scale below to help guide you BEFORE each meal  ? ?Blood sugar before meal Number of units to inject  ?Less than 160 10 unit  ?161 -  190 11 units  ?191 -  220 12 units  ?241 -  250 13 units  ?251 -  280 14 units  ?281 -  310 15 units  ?311 -  340 16 units  ?341 -  370 17 units  ?371 -  400 18 units  ?401 - 430 19 units   ? ? ? ?HOW TO TREAT LOW BLOOD SUGARS (Blood sugar LESS THAN 70 MG/DL) ?Please follow the RULE OF 15 for the treatment of hypoglycemia treatment (when your (blood sugars are less than 70 mg/dL)  ? ?STEP 1: Take 15 grams of carbohydrates when your blood sugar is low, which includes:  ?3-4 GLUCOSE TABS  OR ?3-4 OZ OF JUICE OR REGULAR SODA OR ?ONE TUBE OF GLUCOSE GEL   ? ?STEP 2: RECHECK blood sugar in 15 MINUTES ?STEP 3: If your blood sugar is still low at the 15 minute recheck --> then, go back to STEP 1 and treat AGAIN with another 15 grams of carbohydrates ?

## 2021-10-04 NOTE — Progress Notes (Signed)
?Name: [name]  ?Age/ Sex: 61 y.o., female   ?MRN/ DOB: [id number], Jan 23, 1961    ? ?PCP: Sonia Side., FNP   ?Reason for Endocrinology Evaluation: Type 2 Diabetes Mellitus  ?Initial Endocrine Consultative Visit: 10/07/2019  ? ? ?PATIENT IDENTIFIER: [name] is a 61 y.o. female with a past medical history of HTn and T2DM. The patient has followed with Endocrinology clinic since 10/07/2019 for consultative assistance with management of her diabetes. ? ?DIABETIC HISTORY:  ?[name] was diagnosed with gestational diabetes in 1984, requiring insulin , she was off insulin until her next pregnancy in 1985 and has been on insulin ever since. Her hemoglobin A1c has ranged from 8.1% in 2016, peaking at 12.5% in 2020. ? ? ?On her initial visit to our clinic, she had an A1c of 9.6%. She was on MDI regimen as well as victoza and metformin, which were continued and added jardiance.  ? ?Developed yeast infections to Jardiance 12/2019 ? ?Switched victoza to trulicity 09/5995 ? ?SUBJECTIVE:  ? ?During the last visit (06/04/2021): A1c 12.8 % , we increase basal insulin , decrease prandial dose of insulin, continued Trulicity, metformin, and Jardiance ? ? ? ?Today (10/04/2021): [name] is here for a follow up on diabetes management.  She checks her blood sugars multiple times daily through CGM. She has  hypoglycemic episodes since her last visit. She is symptomatic with these episodes.  ? ?She had a follow-up with podiatry 09/23/2021.  She is s/p left second toe partial application ? ?She admits to forgetting to take insulin  ? ?She denies nausea, vomiting or diarrhea  ?Insurance doesn't cover freestyle libre  ? ?HOME DIABETES REGIMEN:  ?Tyler Aas  30 units daily - has been taking 20-24 units  ?Novolog 10 units with each meal ?Jardiance 25 mg daily  ?Metformin 1000 mg twice daily  ?Trulicity 3.0 mg weekly ( Fridays) ? ? ? ? ?Statin: yes ?ACE-I/ARB: yes ? ? ? ? ?CONTINUOUS GLUCOSE MONITORING RECORD  INTERPRETATION   ? ?Dates of Recording: 3/27-10/03/2021 ? ?Sensor description:freestyle libre ? ?Results statistics: ?  ?CGM use % of time 43  ?Average and SD 254/32.2  ?Time in range 19   %  ?% Time Above 180 32  ?% Time above 250 49  ?% Time Below target 0  ? ? ? ?Glycemic patterns summary: hyperglycemia noted during the day and night  ? ?Hyperglycemic episodes  postprandial and night  ? ?Hypoglycemic episodes occurred post-bolus ? ?Overnight periods: trends down ? ? ? ? ? ? ? ? ?Microvascular complications:  ?Neuropathy, hx of left foot ulcer, retinopathy  ?Denies: CKD  ?Last eye exam: Completed 06/2019 ?  ?Macrovascular complications:  ?CAD, CHF ?Denies:  PVD, CVA ?  ?  ? ? ?HISTORY:  ?Past Medical History:  ?Past Medical History:  ?Diagnosis Date  ? Adhesive capsulitis of shoulder   ? bilateral, Steroid injection Dr. Truman Hayward 1/12 bilaterally  ? CAD (coronary artery disease)   ? nonobstructive. Last cardiac cath (2008) showing left circumflex with mid 50% stenosis and distal luminal irregularities. Also with RCA with mid to distal 30-40% stenosis. // Previously evaluated by [name], never followed up outpatient.  ? CAP (community acquired pneumonia) 06/15/2012  ? 05/2012 CXR: Mild opacification of the posterior lung base on the lateral film  as cannot exclude infection/atelectasis.    ? CHF (congestive heart failure) ([place])   ? Diabetes mellitus 2007  ? Type II, insulin dependent  ? Diabetic retinopathy   ?  GERD (gastroesophageal reflux disease)   ? Glaucoma   ? Hyperlipidemia   ? Hypertension   ? Obesity   ? Peripheral neuropathy   ? ?Past Surgical History:  ?Past Surgical History:  ?Procedure Laterality Date  ? ANTERIOR CERVICAL DECOMP/DISCECTOMY FUSION N/A 06/16/2016  ? Procedure: Anterior Cervical Decompression/discectomy Fusion - Cervical six - Cervical seven;  Surgeon: Eustace Moore, MD;  Location: [place];  Service: Neurosurgery;  Laterality: N/A;  Anterior Cervical Decompression/discectomy Fusion -  Cervical six - Cervical seven  ? APPENDECTOMY    ? CARDIAC CATHETERIZATION    ? CATARACT EXTRACTION    ? GLAUCOMA SURGERY    ? LEFT HEART CATH AND CORONARY ANGIOGRAPHY N/A 05/22/2018  ? Procedure: LEFT HEART CATH AND CORONARY ANGIOGRAPHY;  Surgeon: Troy Sine, MD;  Location: [place] CV LAB;  Service: Cardiovascular;  Laterality: N/A;  ? LEFT HEART CATHETERIZATION WITH CORONARY ANGIOGRAM N/A 01/24/2012  ? Procedure: LEFT HEART CATHETERIZATION WITH CORONARY ANGIOGRAM;  Surgeon: Sherren Mocha, MD;  Location: [name] C[facility] LAB;  Service: Cardiovascular;  Laterality: N/A;  ? POSTERIOR CERVICAL FUSION/FORAMINOTOMY N/A 10/22/2015  ? Procedure: Cervical three-Cervical Seven Posterior cervical fusion with lateral mass fixation,  Laminectomy Cervical Three-Cervical Seven;  Surgeon: Eustace Moore, MD;  Location: [place] NEUR[facility];  Service: Neurosurgery;  Laterality: N/A;  Posterior  ? TUBAL LIGATION    ? ?Social History:  reports that she quit smoking about 15 years ago. Her smoking use included cigarettes. She has a 2.10 pack-year smoking history. She has never used smokeless tobacco. She reports that she does not drink alcohol and does not use drugs. ?Family History:  ?Family History  ?Problem Relation Age of Onset  ? Heart disease Mother   ? Hypertension Sister   ? Hypertension Sister   ? Hypertension Sister   ? Heart attack Neg Hx   ? Stroke Neg Hx   ? ? ? ?HOME MEDICATIONS: ?Allergies as of 10/04/2021   ?No Known Allergies ?  ? ?  ?Medication List  ?  ? ?  ? Accurate as of October 04, 2021  8:01 AM. If you have any questions, ask your nurse or doctor.  ?  ?  ? ?  ? ?amitriptyline 25 MG tablet ?Commonly known as: ELAVIL ?Take 1 tablet (25 mg total) by mouth at bedtime. ?  ?azelastine 0.1 % nasal spray ?Commonly known as: ASTELIN ?azelastine 137 mcg (0.1 %) nasal spray aerosol ?  ?B-D SINGLE USE SWABS REGULAR Pads ?Use as directed ?  ?ciprofloxacin 500 MG tablet ?Commonly known as: Cipro ?Take 1 tablet (500 mg total) by mouth 2  (two) times daily. ?  ?clopidogrel 75 MG tablet ?Commonly known as: PLAVIX ?Take 1 tablet (75 mg total) by mouth daily. ?  ?Combigan 0.2-0.5 % ophthalmic solution ?Generic drug: brimonidine-timolol ?Place 1 drop into both eyes 2 (two) times daily. ?  ?cyclobenzaprine 10 MG tablet ?Commonly known as: FLEXERIL ?TAKE 1 TABLET(10 MG) BY MOUTH TWICE DAILY AS NEEDED FOR MUSCLE SPASMS ?  ?diclofenac sodium 1 % Gel ?Commonly known as: VOLTAREN ?Apply 2 g topically 3 (three) times daily as needed. ?  ?diphenhydrAMINE 25 MG tablet ?Commonly known as: BENADRYL ?Take 1 tablet (25 mg total) by mouth every 6 (six) hours as needed for itching or allergies. ?  ?dorzolamide 2 % ophthalmic solution ?Commonly known as: TRUSOPT ?Place 1 drop into both eyes 2 (two) times daily. ?  ?doxycycline 100 MG tablet ?Commonly known as: VIBRA-TABS ?TAKE 1 TABLET(100 MG) BY MOUTH TWICE  DAILY ?  ?empagliflozin 25 MG Tabs tablet ?Commonly known as: Jardiance ?TAKE 1 TABLET(25 MG) BY MOUTH DAILY BEFORE AND BREAKFAST ?  ?fluticasone 50 MCG/ACT nasal spray ?Commonly known as: Flonase ?Place 1 spray into both nostrils daily. ?  ?FreeStyle Libre [place] ?1 each by Does not apply route 4 (four) times daily. ?  ?FreeStyle Libre 14 Day Sensor Misc ?1 each by Does not apply route QID. ?  ?furosemide 20 MG tablet ?Commonly known as: LASIX ?Take 20 mg by mouth 2 (two) times daily. ?  ?gabapentin 300 MG capsule ?Commonly known as: NEURONTIN ?Take 300 mg by mouth 2 (two) times daily. ?  ?glucose blood test strip ?Commonly known as: OneTouch Verio ?USE TO CHECK BLOOD SUGAR 4 TIMES DAILY AS DIRECTED ?  ?GNP [place] Test Strips test strip ?Generic drug: glucose blood ?Use as instructed ?  ?True Metrix Blood Glucose Test test strip ?Generic drug: glucose blood ?Use as instructed ?  ?GNP True Financial planner w/Device Kit ?1 kit by Does not apply route 4 (four) times daily. E11.42 ?  ?HYDROcodone-acetaminophen 5-325 MG tablet ?Commonly known as:  NORCO/VICODIN ?Take 1-2 tablets by mouth every 6 (six) hours as needed. ?  ?Insulin Pen Needle 32G X 4 MM Misc ?Use to inject insulin up to 4 times daily ?  ?Insulin Pen Needle 32G X 4 MM Misc ?1 Device by Does not apply rou

## 2021-10-07 ENCOUNTER — Ambulatory Visit (INDEPENDENT_AMBULATORY_CARE_PROVIDER_SITE_OTHER): Payer: Medicare Other | Admitting: Podiatry

## 2021-10-07 DIAGNOSIS — M2041 Other hammer toe(s) (acquired), right foot: Secondary | ICD-10-CM

## 2021-10-07 DIAGNOSIS — M2042 Other hammer toe(s) (acquired), left foot: Secondary | ICD-10-CM

## 2021-10-07 DIAGNOSIS — M86172 Other acute osteomyelitis, left ankle and foot: Secondary | ICD-10-CM

## 2021-10-10 NOTE — Progress Notes (Signed)
Subjective: ?[name] is a 61 y.o. is seen today in office s/p left second digit partial amputation preformed on 09/08/2021.  She states that she has not changed the bandage and she is left the wrap on since I saw her last.  She has been doing well no pain.  No fevers or chills. ? ?A1c 11.3 on September 03, 2021. ? ?Objective: ?General: No acute distress, AAOx3  ?DP/PT pulses palpable 2/4, CRT < 3 sec to all digits.  ?Left foot: Incision is well coapted without any evidence of dehiscence.  There is darkened discoloration present of the toe but this is more from dry, inflammation as the bandage has not been changed.  There is no skin breakdown, warmth.  No significant macerated tissue.  No pain.  ?Right foot: hyperkeratotic lesion to the distal aspect of right third toe and upon debridement there is no underlying ulceration drainage or any signs of infection. ?No pain with calf compression, swelling, warmth, erythema.  ? ?Assessment and Plan:  ?Status post left second digit partial application, doing well with no complications; hyperkeratotic lesion right third toe with hammertoe deformity ? ?-Treatment options discussed including all alternatives, risks, and complications ?-At this point the wound is healed.  I recommended her to wash the foot with soap and water daily.  She could still keep a light wrap on it daily but needs to be changed every day. ?-Transition to regular shoe as tolerated.  Encouraged elevation ?-Sharply debrided hyperkeratotic lesion on the right third toe without any complications or bleeding.  Continue offloading. ?-Continue to stress glucose control.  Discussed daily foot inspection. ?-Monitor for any clinical signs or symptoms of infection and directed to call the office immediately should any occur or go to the ER. ? ?Trula Slade DPM ? ?

## 2021-10-25 ENCOUNTER — Other Ambulatory Visit: Payer: Self-pay | Admitting: Cardiovascular Disease

## 2021-11-04 ENCOUNTER — Ambulatory Visit (INDEPENDENT_AMBULATORY_CARE_PROVIDER_SITE_OTHER): Payer: Medicare Other | Admitting: Podiatry

## 2021-11-04 ENCOUNTER — Ambulatory Visit (INDEPENDENT_AMBULATORY_CARE_PROVIDER_SITE_OTHER): Payer: Medicare Other

## 2021-11-04 DIAGNOSIS — L97512 Non-pressure chronic ulcer of other part of right foot with fat layer exposed: Secondary | ICD-10-CM | POA: Diagnosis not present

## 2021-11-04 DIAGNOSIS — L02611 Cutaneous abscess of right foot: Secondary | ICD-10-CM | POA: Diagnosis not present

## 2021-11-04 DIAGNOSIS — L97522 Non-pressure chronic ulcer of other part of left foot with fat layer exposed: Secondary | ICD-10-CM

## 2021-11-04 DIAGNOSIS — L03031 Cellulitis of right toe: Secondary | ICD-10-CM

## 2021-11-04 DIAGNOSIS — L84 Corns and callosities: Secondary | ICD-10-CM

## 2021-11-04 DIAGNOSIS — L089 Local infection of the skin and subcutaneous tissue, unspecified: Secondary | ICD-10-CM

## 2021-11-04 MED ORDER — MUPIROCIN 2 % EX OINT: 1.0000 "application " | TOPICAL_OINTMENT | Freq: Two times a day (BID) | CUTANEOUS | 2 refills | Status: DC

## 2021-11-04 MED ORDER — DOXYCYCLINE HYCLATE 100 MG PO TABS: 100.0000 mg | ORAL_TABLET | Freq: Two times a day (BID) | ORAL | 0 refills | Status: DC

## 2021-11-04 NOTE — Progress Notes (Signed)
Subjective: ?61 year old female presents the office today for foot evaluation undergoing left partial second amputation.  She states this is been doing well but she has been having some swelling and discomfort of the right third digit toe again.  She has not seen any drainage or pus.  She has no fevers or chills that she reports.  No other concerns.  No recent injuries. ? ?A1c 11.3 on 10/04/2021 ? ?Objective: ?AAO x3, NAD ?DP/PT pulses palpable bilaterally, CRT less than 3 seconds ?Sensation decreased with Semmes Weinstein monofilament. ?Left second digit partial toe rotation appears to be healed.  Some trace edema still present there is no erythema or warmth. ?On the right third digit there is hammertoe deformity.  Hyperkeratotic tissue again at the distal aspect of the toe however upon debridement I was able to identify purulence and there was a superficial granular wound measuring 0.5 x 0.4 x 0.1 cm without any probing, undermining or tunneling.  There is no fluctuation or crepitation.  There is malodor. ?No pain with calf compression, swelling, warmth, erythema ? ?Assessment: ?Right third digit infection; healed amputation left second toe ? ?Plan: ?-All treatment options discussed with the patient including all alternatives, risks, complications.  ?-Also toe appears to be healed.  Continue offloading daily and daily foot inspection to monitor for any recurrence of infection or ulceration. ?-X-rays obtained and reviewed of the right foot.  3 views were obtained.  Hammertoes are present.  No obvious signs of cortical destruction suggestive of osteomyelitis at this time. ?-Sharply debrided the callus on the right third toe to reveal underlying ulceration.  Prior to debridement was not able to see the wound after debridement the measurements are above.  I sharply debrided the wound down to healthy, granular tissue I was able to remove purulence.  After debrided the wound no further purulence.  A wound culture was  obtained.  I cleaned the wound with saline.  Silvadene was applied followed by dressing.  Recommend daily dressing changes.  Offloading at all times.  Wear surgical shoe.  Prescribed doxycycline.  Monitor for any signs or symptoms of worsening infection report to emergency room immediately should any occur. ?-Patient encouraged to call the office with any questions, concerns, change in symptoms.  ? ?Trula Slade DPM ? ?

## 2021-11-07 LAB — WOUND CULTURE
MICRO NUMBER:: 13383327
SPECIMEN QUALITY:: ADEQUATE
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
## 2021-11-16 ENCOUNTER — Encounter: Payer: Self-pay | Admitting: Physician Assistant

## 2021-11-16 ENCOUNTER — Ambulatory Visit (INDEPENDENT_AMBULATORY_CARE_PROVIDER_SITE_OTHER): Payer: Medicare Other | Admitting: Physician Assistant

## 2021-11-16 VITALS — BP 90/42 | HR 107 | Ht 66.0 in | Wt 226.6 lb

## 2021-11-16 DIAGNOSIS — E782 Mixed hyperlipidemia: Secondary | ICD-10-CM

## 2021-11-16 DIAGNOSIS — I5020 Unspecified systolic (congestive) heart failure: Secondary | ICD-10-CM

## 2021-11-16 DIAGNOSIS — Z794 Long term (current) use of insulin: Secondary | ICD-10-CM

## 2021-11-16 DIAGNOSIS — E11319 Type 2 diabetes mellitus with unspecified diabetic retinopathy without macular edema: Secondary | ICD-10-CM

## 2021-11-16 DIAGNOSIS — I10 Essential (primary) hypertension: Secondary | ICD-10-CM

## 2021-11-16 DIAGNOSIS — R000 Tachycardia, unspecified: Secondary | ICD-10-CM

## 2021-11-16 DIAGNOSIS — I25119 Atherosclerotic heart disease of native coronary artery with unspecified angina pectoris: Secondary | ICD-10-CM | POA: Diagnosis not present

## 2021-11-16 LAB — BASIC METABOLIC PANEL
BUN/Creatinine Ratio: 21 (ref 12–28)
BUN: 27 mg/dL (ref 8–27)
CO2: 23 mmol/L (ref 20–29)
Calcium: 9.6 mg/dL (ref 8.7–10.3)
Chloride: 99 mmol/L (ref 96–106)
Creatinine, Ser: 1.27 mg/dL — ABNORMAL HIGH (ref 0.57–1.00)
Glucose: 298 mg/dL — ABNORMAL HIGH (ref 70–99)
Potassium: 4.4 mmol/L (ref 3.5–5.2)
Sodium: 139 mmol/L (ref 134–144)
eGFR: 48 mL/min/{1.73_m2} — ABNORMAL LOW (ref >59)

## 2021-11-16 LAB — CBC
Hematocrit: 33.9 % — ABNORMAL LOW (ref 34.0–46.6)
Hemoglobin: 10.9 g/dL — ABNORMAL LOW (ref 11.1–15.9)
MCH: 28 pg (ref 26.6–33.0)
MCHC: 32.2 g/dL (ref 31.5–35.7)
MCV: 87 fL (ref 79–97)
Platelets: 245 10*3/uL (ref 150–450)
RBC: 3.89 x10E6/uL (ref 3.77–5.28)
RDW: 13.1 % (ref 11.7–15.4)
WBC: 9.4 10*3/uL (ref 3.4–10.8)

## 2021-11-16 MED ORDER — RANOLAZINE ER 500 MG PO TB12: 500.0000 mg | ORAL_TABLET | Freq: Two times a day (BID) | ORAL | 3 refills | Status: DC

## 2021-11-16 NOTE — Assessment & Plan Note (Signed)
Status post non-STEMI in November 2019.  She has diffuse small vessel and distal vessel disease which has been managed medically.  She has done fairly well until recently.  She had some anginal symptoms at her sister's funeral.  She has not had any further symptoms.  Her electrocardiogram does not demonstrate any acute changes.  She was previously on ranolazine.  She had difficulty getting this refilled and has been off of it for several months.  At this point, I do not think she needs any further testing.  I have recommended resuming ranolazine 500 mg twice daily.  Continue Plavix 75 mg daily, metoprolol tartrate 75 mg twice daily, isosorbide mononitrate 60 mg daily.  Follow-up in 6 months or sooner if symptoms should change.

## 2021-11-16 NOTE — Assessment & Plan Note (Signed)
Followed by endocrinology 

## 2021-11-16 NOTE — Assessment & Plan Note (Signed)
LDL in December was optimal.  Continue rosuvastatin 40 mg daily.

## 2021-11-16 NOTE — Assessment & Plan Note (Signed)
Blood pressure tends to run on the low side.  This is limited titration of GDMT.  Repeat blood pressure by me was 116/64

## 2021-11-16 NOTE — Patient Instructions (Signed)
Medication Instructions:  Your physician has recommended you make the following change in your medication:   RESTART Ranexa 500 mg taking 1 every 12 hours (twice a day)  *If you need a refill on your cardiac medications before your next appointment, please call your pharmacy*   Lab Work: TODAY:  BMET & CBC  If you have labs (blood work) drawn today and your tests are completely normal, you will receive your results only by: [place] (if you have MyChart) OR A paper copy in the mail If you have any lab test that is abnormal or we need to change your treatment, we will call you to review the results.   Testing/Procedures: None ordered   Follow-Up: At [facility], you and your health needs are our priority.  As part of our continuing mission to provide you with exceptional heart care, we have created designated Provider Care Teams.  These Care Teams include your primary Cardiologist (physician) and Advanced Practice Providers (APPs -  Physician Assistants and Nurse Practitioners) who all work together to provide you with the care you need, when you need it.  We recommend signing up for the patient portal called "MyChart".  Sign up information is provided on this After Visit Summary.  MyChart is used to connect with patients for Virtual Visits (Telemedicine).  Patients are able to view lab/test results, encounter notes, upcoming appointments, etc.  Non-urgent messages can be sent to your provider as well.   To learn more about what you can do with MyChart, go to [email or web address].    Your next appointment:   6 month(s)  The format for your next appointment:   In Person  Provider:   Sherren Mocha, MD  or Richardson Dopp, PA-C         Other Instructions   Important Information About Sugar

## 2021-11-16 NOTE — Assessment & Plan Note (Signed)
Her heart rate is elevated today.  I suspect that this is all related to her toe infection which has been managed over the past several months.  I will obtain a follow-up BMET and CBC today.

## 2021-11-16 NOTE — Assessment & Plan Note (Signed)
EF 45-50 by echocardiogram in November 2019.  She has been felt to have a nonischemic cardiomyopathy in the past as her cardiomyopathy was out of proportion to her CAD.  Volume status appears to be stable.  Continue furosemide 20 mg twice daily, isosorbide mononitrate 60 mg daily, lisinopril 2.5 mg daily, Toprol tartrate 60 mg twice daily.  Of note, she is on empagliflozin for her diabetes.

## 2021-11-19 ENCOUNTER — Encounter: Payer: Medicare Other | Admitting: Podiatry

## 2021-11-23 ENCOUNTER — Ambulatory Visit (INDEPENDENT_AMBULATORY_CARE_PROVIDER_SITE_OTHER): Payer: Medicare Other | Admitting: Podiatry

## 2021-11-23 ENCOUNTER — Other Ambulatory Visit: Payer: Self-pay | Admitting: Podiatry

## 2021-11-23 DIAGNOSIS — L84 Corns and callosities: Secondary | ICD-10-CM

## 2021-11-23 DIAGNOSIS — L97512 Non-pressure chronic ulcer of other part of right foot with fat layer exposed: Secondary | ICD-10-CM | POA: Diagnosis not present

## 2021-11-23 DIAGNOSIS — L02611 Cutaneous abscess of right foot: Secondary | ICD-10-CM

## 2021-11-30 NOTE — Progress Notes (Signed)
Subjective: 61 year old female presents the office today for foot evaluation undergoing left partial second amputation and for a wound on the right third digit.  She states that she is doing well she does not see any increase swelling or redness.  She denies any drainage or pus.  She denies any fevers or chills.  She has no new concerns today.    A1c 11.3 on 10/04/2021  Objective: AAO x3, NAD DP/PT pulses palpable bilaterally, CRT less than 3 seconds Sensation decreased with Semmes Weinstein monofilament. Left second digit partial toe amputation appears to be healed.  Scar is well formed.  There is no erythema or warmth.  There is no drainage. The distal aspect of the right third toe is hyperkeratotic tissue which is preulcerative.  There is no skin breakdown and there is no probing to monitoring time.  Minimal edema the tip of the toe but there is no erythema or warmth but there is no drainage or pus.  No fluctuation or crepitation.  No malodor. Hammertoes present.  No pain with calf compression, swelling, warmth, erythema  Assessment: Right third digit infection; healed amputation left second toe  Plan: -All treatment options discussed with the patient including all alternatives, risks, complications.  -Left side amputation site is healed.  Continue to monitor closely. -Should be debrided hyperkeratotic lesion which is preulcerative on the right third toe.  Continue offloading.  Monitor closely for any signs or symptoms of infection or opening of the wound. -Daily foot inspection of glucose control discussed. -Monitor for any clinical signs or symptoms of infection and directed to call the office immediately should any occur or go to the ER.  Return in about 3 weeks (around 12/14/2021) for toe ulcer right, x-ray.  Trula Slade DPM

## 2021-12-14 ENCOUNTER — Ambulatory Visit (INDEPENDENT_AMBULATORY_CARE_PROVIDER_SITE_OTHER): Payer: Medicare Other | Admitting: Podiatry

## 2021-12-14 ENCOUNTER — Ambulatory Visit (INDEPENDENT_AMBULATORY_CARE_PROVIDER_SITE_OTHER): Payer: Medicare Other

## 2021-12-14 DIAGNOSIS — L02611 Cutaneous abscess of right foot: Secondary | ICD-10-CM

## 2021-12-14 DIAGNOSIS — L03031 Cellulitis of right toe: Secondary | ICD-10-CM

## 2021-12-14 DIAGNOSIS — M86071 Acute hematogenous osteomyelitis, right ankle and foot: Secondary | ICD-10-CM

## 2021-12-14 DIAGNOSIS — L84 Corns and callosities: Secondary | ICD-10-CM

## 2021-12-14 MED ORDER — DOXYCYCLINE HYCLATE 100 MG PO TABS: 100.0000 mg | ORAL_TABLET | Freq: Two times a day (BID) | ORAL | 0 refills | Status: DC

## 2021-12-20 NOTE — Progress Notes (Signed)
Subjective: 61 year old female presents the office today for foot evaluation undergoing left partial second amputation and for a wound on the right third digit.  Overall states that she is doing well.  She denies any drainage or pus.  She states that " it looks better to me".  Denies any fevers or chills.  A1c 11.3 on 10/04/2021  Objective: AAO x3, NAD DP/PT pulses palpable bilaterally, CRT less than 3 seconds Sensation decreased with Semmes Weinstein monofilament. Left second digit partial toe amputation appears to be healed.  Scar well formed.  The distal aspect of the right third toe is hyperkeratotic tissue and appears that the wound is almost completely healed as well.  There is mild edema there is no erythema or warmth.  No fluctuance or crepitation but there is malodor.  Hammertoes present. No pain with calf compression, swelling, warmth, erythema  Assessment: Right third digit infection; healed amputation left second toe  Plan: -All treatment options discussed with the patient including all alternatives, risks, complications.  -Repeat x-rays obtained reviewed.  On the lateral view there does appear to be some chronic cortical destruction of the third distal phalanx concerning for osteomyelitis. -I discussed the x-ray findings with the patient but clinically the wound is doing much better almost healed and there is no cellulitis or drainage or obvious signs of infection otherwise.  Recheck blood work including CBC, sed rate, CRP.  Continue offloading and close monitoring this.  Doxycycline. -Left foot is healed.  Return in about 3 weeks (around 01/04/2022).  Repeat x-ray right foot  Vivi Barrack DPM

## 2021-12-21 ENCOUNTER — Other Ambulatory Visit: Payer: Medicare Other

## 2022-01-07 ENCOUNTER — Ambulatory Visit (INDEPENDENT_AMBULATORY_CARE_PROVIDER_SITE_OTHER): Payer: Medicare Other

## 2022-01-07 ENCOUNTER — Ambulatory Visit (INDEPENDENT_AMBULATORY_CARE_PROVIDER_SITE_OTHER): Payer: Medicare Other | Admitting: Podiatry

## 2022-01-07 ENCOUNTER — Ambulatory Visit: Payer: Medicare Other

## 2022-01-07 DIAGNOSIS — M2042 Other hammer toe(s) (acquired), left foot: Secondary | ICD-10-CM | POA: Diagnosis not present

## 2022-01-07 DIAGNOSIS — L84 Corns and callosities: Secondary | ICD-10-CM | POA: Diagnosis not present

## 2022-01-07 DIAGNOSIS — Z9889 Other specified postprocedural states: Secondary | ICD-10-CM

## 2022-01-07 DIAGNOSIS — M2041 Other hammer toe(s) (acquired), right foot: Secondary | ICD-10-CM

## 2022-01-08 LAB — CBC WITH DIFFERENTIAL/PLATELET
Absolute Monocytes: 437 cells/uL (ref 200–950)
Basophils Absolute: 49 cells/uL (ref 0–200)
Basophils Relative: 0.6 %
Eosinophils Absolute: 81 cells/uL (ref 15–500)
Eosinophils Relative: 1 %
HCT: 37 % (ref 35.0–45.0)
Hemoglobin: 11.6 g/dL — ABNORMAL LOW (ref 11.7–15.5)
Lymphs Abs: 2730 cells/uL (ref 850–3900)
MCH: 28.2 pg (ref 27.0–33.0)
MCHC: 31.4 g/dL — ABNORMAL LOW (ref 32.0–36.0)
MCV: 90 fL (ref 80.0–100.0)
MPV: 11 fL (ref 7.5–12.5)
Monocytes Relative: 5.4 %
Neutro Abs: 4803 cells/uL (ref 1500–7800)
Neutrophils Relative %: 59.3 %
Platelets: 251 10*3/uL (ref 140–400)
RBC: 4.11 10*6/uL (ref 3.80–5.10)
RDW: 13.8 % (ref 11.0–15.0)
Total Lymphocyte: 33.7 %
WBC: 8.1 10*3/uL (ref 3.8–10.8)

## 2022-01-08 LAB — C-REACTIVE PROTEIN: CRP: 9.8 mg/L — ABNORMAL HIGH (ref <8.0)

## 2022-01-09 NOTE — Progress Notes (Signed)
Subjective: 61 year old female presents the office today for follow evaluation of right third toe ulceration.  She said that her toe is doing great not be any issues and is healed but she has a new lesion on her left third toe today.  No drainage or pus that she noticed a spot is formed at the tip of the toe.  The amputation of the second toe is well-healed.  She denies any fevers or chills.  No other concerns.  Objective: AAO x3, NAD DP/PT pulses palpable bilaterally, CRT less than 3 seconds Left second toe potation site is well-healed.  Hammertoes are present bilaterally.  The distal aspect of right third toe has mild hyperkeratotic tissue but upon debridement there is no underlying ulceration drainage or any signs of infection.  No open lesions of the right foot.  The left foot there is a preulcerative callus at the tip of the left third toe.  There is no drainage or pus.  No fluctuation or crepitation.  There is malodor.  No pain with calf compression, swelling, warmth, erythema  Assessment: Left third toe ulceration  Plan: -All treatment options discussed with the patient including all alternatives, risks, complications.  -X-rays were obtained and reviewed bilaterally.  Right third toe appears to be stable.  No definitive evidence of acute osteomyelitis of the left third toe. -I sharply debrided the minimal hyperkeratotic tissue in the right third toe without any complications or bleeding.  Clinically there is no signs of infection the wound is healed. -Debrided the preulcerative callus on the left third toe without any complications.  We will monitor this very closely.  Dispensed offloading pads.  Encouraged glucose control.  Monitor for any signs or symptoms of infection. -Amputation site is well-healed. -Patient encouraged to call the office with any questions, concerns, change in symptoms.   Trula Slade DPM

## 2022-01-10 ENCOUNTER — Telehealth: Payer: Self-pay | Admitting: *Deleted

## 2022-01-10 NOTE — Telephone Encounter (Signed)
Patient is calling for test lab results from 01/07/22,Please advise.

## 2022-01-28 ENCOUNTER — Ambulatory Visit: Payer: Medicare Other | Admitting: Podiatry

## 2022-02-03 ENCOUNTER — Other Ambulatory Visit: Payer: Self-pay | Admitting: Internal Medicine

## 2022-02-14 ENCOUNTER — Other Ambulatory Visit: Payer: Self-pay | Admitting: Internal Medicine

## 2022-02-14 NOTE — Progress Notes (Unsigned)
Name: [name]  Age/ Sex: 61 y.o., female   MRN/ DOB: [id number], [birth year]     PCP: Sonia Side., FNP   Reason for Endocrinology Evaluation: Type 2 Diabetes Mellitus  Initial Endocrine Consultative Visit: 10/07/2019    PATIENT IDENTIFIER: [name] is a 61 y.o. female with a past medical history of HTn and T2DM. The patient has followed with Endocrinology clinic since 10/07/2019 for consultative assistance with management of her diabetes.  DIABETIC HISTORY:  [name] was diagnosed with gestational diabetes in 1984, requiring insulin , she was off insulin until her next pregnancy in 1985 and has been on insulin ever since. Her hemoglobin A1c has ranged from 8.1% in 2016, peaking at 12.5% in 2020.   On her initial visit to our clinic, she had an A1c of 9.6%. She was on MDI regimen as well as victoza and metformin, which were continued and added jardiance.   Developed yeast infections to Jardiance 12/2019  Switched victoza to trulicity 11/2945  SUBJECTIVE:   During the last visit (10/04/2021): A1c 11.3 % , we increase basal insulin , decrease prandial dose of insulin, continued Trulicity, metformin, and Jardiance    Today (02/14/2022): [name] is here for a follow up on diabetes management.  She checks her blood sugars multiple times daily through CGM. She has  hypoglycemic episodes since her last visit. She is symptomatic with these episodes.   She had a follow-up with podiatry 12/2021 for hammer toes    She denies nausea, vomiting or diarrhea  Insurance doesn't cover freestyle [place]:  Tyler Aas  30 units daily Novolog 10 units with each meal Jardiance 25 mg daily  Metformin 1000 mg twice daily  Trulicity 3.0 mg weekly ( Fridays)     Statin: yes ACE-I/ARB: yes     CONTINUOUS GLUCOSE MONITORING RECORD INTERPRETATION    Dates of Recording: 3/27-10/03/2021  Sensor description:freestyle libre  Results statistics:    CGM use % of time 43  Average and SD 254/32.2  Time in range 19   %  % Time Above 180 32  % Time above 250 49  % Time Below target 0     Glycemic patterns summary: hyperglycemia noted during the day and night   Hyperglycemic episodes  postprandial and night   Hypoglycemic episodes occurred post-bolus  Overnight periods: trends down         Microvascular complications:  Neuropathy, hx of left foot ulcer, retinopathy  Denies: CKD  Last eye exam: Completed 06/2019   Macrovascular complications:  CAD, CHF Denies:  PVD, CVA       HISTORY:  Past Medical History:  Past Medical History:  Diagnosis Date   Adhesive capsulitis of shoulder    bilateral, Steroid injection Dr. Truman Hayward 1/12 bilaterally   CAD (coronary artery disease)    nonobstructive. Last cardiac cath (2008) showing left circumflex with mid 50% stenosis and distal luminal irregularities. Also with RCA with mid to distal 30-40% stenosis. // Previously evaluated by [facility], never followed up outpatient.   CAP (community acquired pneumonia) 06/15/2012   05/2012 CXR: Mild opacification of the posterior lung base on the lateral film  as cannot exclude infection/atelectasis.     CHF (congestive heart failure) ([place])    Diabetes mellitus 2007   Type II, insulin dependent   Diabetic retinopathy    GERD (gastroesophageal reflux disease)    Glaucoma    Hyperlipidemia    Hypertension  Obesity    Peripheral neuropathy    Past Surgical History:  Past Surgical History:  Procedure Laterality Date   ANTERIOR CERVICAL DECOMP/DISCECTOMY FUSION N/A 06/16/2016   Procedure: Anterior Cervical Decompression/discectomy Fusion - Cervical six - Cervical seven;  Surgeon: Eustace Moore, MD;  Location: [facility];  Service: Neurosurgery;  Laterality: N/A;  Anterior Cervical Decompression/discectomy Fusion - Cervical six - Cervical seven   APPENDECTOMY     CARDIAC CATHETERIZATION     CATARACT EXTRACTION     GLAUCOMA SURGERY      LEFT HEART CATH AND CORONARY ANGIOGRAPHY N/A 05/22/2018   Procedure: LEFT HEART CATH AND CORONARY ANGIOGRAPHY;  Surgeon: Troy Sine, MD;  Location: [facility] CV LAB;  Service: Cardiovascular;  Laterality: N/A;   LEFT HEART CATHETERIZATION WITH CORONARY ANGIOGRAM N/A 01/24/2012   Procedure: LEFT HEART CATHETERIZATION WITH CORONARY ANGIOGRAM;  Surgeon: Sherren Mocha, MD;  Location: [facility] CATH LAB;  Service: Cardiovascular;  Laterality: N/A;   POSTERIOR CERVICAL FUSION/FORAMINOTOMY N/A 10/22/2015   Procedure: Cervical three-Cervical Seven Posterior cervical fusion with lateral mass fixation,  Laminectomy Cervical Three-Cervical Seven;  Surgeon: Eustace Moore, MD;  Location: [facility] NEURO ORS;  Service: Neurosurgery;  Laterality: N/A;  Posterior   TUBAL LIGATION     Social History:  reports that she quit smoking about 15 years ago. Her smoking use included cigarettes. She has a 2.10 pack-year smoking history. She has never used smokeless tobacco. She reports that she does not drink alcohol and does not use drugs. Family History:  Family History  Problem Relation Age of Onset   Heart disease Mother    Hypertension Sister    Hypertension Sister    Hypertension Sister    Heart attack Neg Hx    Stroke Neg Hx      HOME MEDICATIONS: Allergies as of 02/15/2022   No Known Allergies      Medication List        Accurate as of February 14, 2022 10:59 AM. If you have any questions, ask your nurse or doctor.          amitriptyline 25 MG tablet Commonly known as: ELAVIL Take 1 tablet (25 mg total) by mouth at bedtime.   azelastine 0.1 % nasal spray Commonly known as: ASTELIN azelastine 137 mcg (0.1 %) nasal spray aerosol   B-D SINGLE USE SWABS REGULAR Pads Use as directed   clopidogrel 75 MG tablet Commonly known as: PLAVIX Take 1 tablet (75 mg total) by mouth daily.   Combigan 0.2-0.5 % ophthalmic solution Generic drug: brimonidine-timolol Place 1 drop into both eyes 2 (two)  times daily.   cyclobenzaprine 10 MG tablet Commonly known as: FLEXERIL TAKE 1 TABLET(10 MG) BY MOUTH TWICE DAILY AS NEEDED FOR MUSCLE SPASMS   Dexcom G6 Sensor Misc 1 Device by Does not apply route as directed.   Dexcom G6 Transmitter Misc 1 Device by Does not apply route as directed.   diphenhydrAMINE 25 MG tablet Commonly known as: BENADRYL Take 1 tablet (25 mg total) by mouth every 6 (six) hours as needed for itching or allergies.   dorzolamide 2 % ophthalmic solution Commonly known as: TRUSOPT Place 1 drop into both eyes 2 (two) times daily.   doxycycline 100 MG tablet Commonly known as: VIBRA-TABS Take 1 tablet (100 mg total) by mouth 2 (two) times daily.   fluticasone 50 MCG/ACT nasal spray Commonly known as: Flonase Place 1 spray into both nostrils daily.   furosemide 20 MG tablet Commonly known as: LASIX  Take 20 mg by mouth 2 (two) times daily.   gabapentin 300 MG capsule Commonly known as: NEURONTIN Take 300 mg by mouth 2 (two) times daily.   glucose blood test strip Commonly known as: OneTouch Verio USE TO CHECK BLOOD SUGAR 4 TIMES DAILY AS DIRECTED   GNP Truetrack Test Strips test strip Generic drug: glucose blood Use as instructed   True Metrix Blood Glucose Test test strip Generic drug: glucose blood Use as instructed   GNP True Metrix Air Meter w/Device Kit 1 kit by Does not apply route 4 (four) times daily. E11.42   Insulin Pen Needle 32G X 4 MM Misc Use to inject insulin up to 4 times daily   Insulin Pen Needle 32G X 4 MM Misc 1 Device by Does not apply route in the morning, at noon, in the evening, and at bedtime.   isosorbide mononitrate 60 MG 24 hr tablet Commonly known as: IMDUR TAKE 1 TABLET EVERY DAY   Jardiance 25 MG Tabs tablet Generic drug: empagliflozin TAKE 1 TABLET(25 MG) BY MOUTH DAILY BEFORE AND BREAKFAST   lisinopril 2.5 MG tablet Commonly known as: ZESTRIL TAKE 1 TABLET(2.5 MG) BY MOUTH DAILY   metFORMIN 1000 MG  tablet Commonly known as: GLUCOPHAGE TAKE 1 TABLET BY MOUTH TWICE DAILY WITH A MEAL   metoprolol tartrate 50 MG tablet Commonly known as: LOPRESSOR TAKE 1 AND 1/2 TABLETS TWICE DAILY   montelukast 10 MG tablet Commonly known as: SINGULAIR montelukast 10 mg tablet   nitroGLYCERIN 0.4 MG SL tablet Commonly known as: NITROSTAT DISSOLVE 1 TABLET (0.4 MG TOTAL) UNDER THE TONGUE EVERY 5 MINUTES AS NEEDED FOR CHEST PAIN.   NovoLOG FlexPen 100 UNIT/ML FlexPen Generic drug: insulin aspart Inject 14 Units into the skin 3 (three) times daily with meals.   OneTouch Delica Lancets Fine Misc Check blood sugar as instructed up to 3 times a day   TRUEplus Lancets 33G Misc Check 4 times a daily   oxyCODONE-acetaminophen 5-325 MG tablet Commonly known as: PERCOCET/ROXICET Take 1 tablet by mouth every 8 (eight) hours as needed for severe pain.   pantoprazole 40 MG tablet Commonly known as: PROTONIX Take 1 tablet (40 mg total) by mouth daily.   ranolazine 500 MG 12 hr tablet Commonly known as: Ranexa Take 1 tablet (500 mg total) by mouth 2 (two) times daily.   Rocklatan 0.02-0.005 % Soln Generic drug: Netarsudil-Latanoprost Rocklatan 0.02 %-0.005 % eye drops   rosuvastatin 40 MG tablet Commonly known as: CRESTOR TAKE 1 TABLET(40 MG) BY MOUTH DAILY   Santyl 250 UNIT/GM ointment Generic drug: collagenase Apply 1 application topically daily as needed (foot).   Tyler Aas FlexTouch 100 UNIT/ML FlexTouch Pen Generic drug: insulin degludec Inject 30 Units into the skin daily.   True Metrix Level 1 Low Soln Use as directed   Trulicity 3 LA/4.5XM Sopn Generic drug: Dulaglutide Inject 3 mg as directed once a week.         OBJECTIVE:   Vital Signs: LMP 02/13/2011   Wt Readings from Last 3 Encounters:  11/16/21 226 lb 9.6 oz (102.8 kg)  10/04/21 226 lb (102.5 kg)  06/04/21 235 lb 6.4 oz (106.8 kg)     Exam: General: Pt appears well and is in NAD  Extremities: Trace   pretibial edema.   Neuro: MS is good with appropriate affect, pt is alert and Ox3      DM foot exam: 01/27/2021   The skin of the feet  showed callous formation of the  distal right great toe , deformed right 2nd toe  She has left 2nd toe swelling, and tenderness as well as distal callous formation  The pedal pulses are undetectable The sensation is decreased  to a screening 5.07, 10 gram monofilament bilaterally       DATA REVIEWED:  Lab Results  Component Value Date   HGBA1C 11.3 (A) 10/04/2021   HGBA1C 12.8 (A) 06/04/2021   HGBA1C 11.3 (A) 01/27/2021   Lab Results  Component Value Date   MICROALBUR <0.7 06/04/2021   LDLCALC 42 06/09/2021   CREATININE 1.27 (H) 11/16/2021   Lab Results  Component Value Date   MICRALBCREAT 1.1 06/04/2021     Lab Results  Component Value Date   CHOL 116 06/09/2021   HDL 49 06/09/2021   LDLCALC 42 06/09/2021   TRIG 147 06/09/2021   CHOLHDL 2.4 06/09/2021     Latest Reference Range & Units 06/04/21 11:50  Sodium 135 - 145 mEq/L 140  Potassium 3.5 - 5.1 mEq/L 4.5  Chloride 96 - 112 mEq/L 103  CO2 19 - 32 mEq/L 27  Glucose 70 - 99 mg/dL 143 (H)  BUN 6 - 23 mg/dL 22  Creatinine 0.40 - 1.20 mg/dL 1.21 (H)  Calcium 8.4 - 10.5 mg/dL 9.8  GFR >60.00 mL/min 48.06 (L)  MICROALB/CREAT RATIO 0.0 - 30.0 mg/g 1.1  TSH 0.35 - 5.50 uIU/mL 1.71  T4,Free(Direct) 0.60 - 1.60 ng/dL 0.68  Creatinine,U mg/dL 64.9  Microalb, Ur 0.0 - 1.9 mg/dL <0.7   ASSESSMENT / PLAN / RECOMMENDATIONS:   1) Type 2 Diabetes Mellitus, Poorly controlled, With retinopathic,  neuropathic, and macrovascular  complications complications - Most recent A1c of 11.3 %. Goal A1c < 7.0 %.     - Pt continues with hyperglycemia, this is due to medication non-adherence and dietary indiscretions.  - She continues to use old doses of basal insulin and has been using the correction scale without the standing dose of prandial insulin  - Insurance won't cover freestyle [place], will  fax Dexcom prescription to [place] -We discussed the importance of using daily reminders to take her medication properly, discussed increased risk of infection, amputation, and CKD with persistent hyperglycemia -We will adjust her insulin as below   MEDICATIONS: -  Increase  Tresiba to 30 units daily  - Continue  Novolog 10 units with each meal  - Continue  Jardiance  25 mg daily - Continue Metformin 1000 mg twice daily  - Continue Trulicity  3.0  mg ONCE WEEKLY - CF : Novolog (BG -130/30)    EDUCATION / INSTRUCTIONS: BG monitoring instructions: Patient is instructed to check her blood sugars 4 times a day, before meals and bedtime  Call [facility] if: BG persistently < 70  I reviewed the Rule of 15 for the treatment of hypoglycemia in detail with the patient. Literature supplied.    2) Diabetic complications:  Eye: Does have known diabetic retinopathy.  Neuro/ Feet: Does have known diabetic peripheral neuropathy .  Renal: Patient does not have known baseline CKD. She   is on an ACEI/ARB at present.      F/U in 4 months    Signed electronically by: Mack Guise, MD  [facility]  [address] [place], [place] [address] Phone: [phone] FAX: [phone]   CC: [address] [place] [address] Phone: [phone]  Fax: [phone]  Return to Endocrinology clinic as below: Future Appointments  Date Time Provider [place]  02/15/2022  8:10 AM Dave Mannes, Melanie Crazier, MD [facility] None  02/18/2022  8:45 AM Trula Slade, DPM [facility]  05/09/2022  8:40 AM Sherren Mocha, MD [facility]

## 2022-02-15 ENCOUNTER — Ambulatory Visit (INDEPENDENT_AMBULATORY_CARE_PROVIDER_SITE_OTHER): Payer: Medicare Other | Admitting: Internal Medicine

## 2022-02-15 ENCOUNTER — Telehealth: Payer: Self-pay

## 2022-02-15 ENCOUNTER — Encounter: Payer: Self-pay | Admitting: Internal Medicine

## 2022-02-15 VITALS — BP 124/70 | HR 88 | Ht 66.0 in | Wt 237.0 lb

## 2022-02-15 DIAGNOSIS — E1142 Type 2 diabetes mellitus with diabetic polyneuropathy: Secondary | ICD-10-CM

## 2022-02-15 DIAGNOSIS — Z794 Long term (current) use of insulin: Secondary | ICD-10-CM | POA: Diagnosis not present

## 2022-02-15 DIAGNOSIS — E1122 Type 2 diabetes mellitus with diabetic chronic kidney disease: Secondary | ICD-10-CM | POA: Diagnosis not present

## 2022-02-15 DIAGNOSIS — E1159 Type 2 diabetes mellitus with other circulatory complications: Secondary | ICD-10-CM

## 2022-02-15 DIAGNOSIS — E11319 Type 2 diabetes mellitus with unspecified diabetic retinopathy without macular edema: Secondary | ICD-10-CM

## 2022-02-15 DIAGNOSIS — N1831 Chronic kidney disease, stage 3a: Secondary | ICD-10-CM

## 2022-02-15 LAB — POCT GLYCOSYLATED HEMOGLOBIN (HGB A1C): Hemoglobin A1C: 10 % — AB (ref 4.0–5.6)

## 2022-02-15 MED ORDER — TRULICITY 3 MG/0.5ML ~~LOC~~ SOAJ
3.0000 mg | SUBCUTANEOUS | 3 refills | Status: DC
Start: 2022-02-15 — End: 2022-09-12

## 2022-02-15 MED ORDER — METFORMIN HCL 1000 MG PO TABS
1000.0000 mg | ORAL_TABLET | Freq: Two times a day (BID) | ORAL | 3 refills | Status: DC
Start: 2022-02-15 — End: 2023-02-24

## 2022-02-15 MED ORDER — EMPAGLIFLOZIN 25 MG PO TABS: 25.0000 mg | ORAL_TABLET | Freq: Every day | ORAL | 3 refills | Status: DC

## 2022-02-15 MED ORDER — NOVOLOG FLEXPEN 100 UNIT/ML ~~LOC~~ SOPN: PEN_INJECTOR | SUBCUTANEOUS | 3 refills | Status: DC

## 2022-02-15 MED ORDER — TRESIBA FLEXTOUCH 100 UNIT/ML ~~LOC~~ SOPN: 36.0000 [IU] | PEN_INJECTOR | Freq: Every day | SUBCUTANEOUS | 3 refills | Status: DC

## 2022-02-15 MED ORDER — HUMALOG KWIKPEN 200 UNIT/ML ~~LOC~~ SOPN
PEN_INJECTOR | SUBCUTANEOUS | 3 refills | Status: DC
Start: 2022-02-15 — End: 2022-05-11

## 2022-02-15 MED ORDER — INSULIN PEN NEEDLE 31G X 5 MM MISC
1.0000 | Freq: Four times a day (QID) | 3 refills | Status: DC
Start: 2022-02-15 — End: 2022-08-19

## 2022-02-15 NOTE — Patient Instructions (Signed)
-   Increase  Tresiba to 36 units daily  - Continue  Jardiance  25 mg daily - Continue Metformin 1000 mg twice daily  - Continue  Trulicity 3.0  mg ONCE WEEKLY  -Novolog correctional insulin:  Use the scale below to help guide you BEFORE each meal   Blood sugar before meal Number of units to inject  Less than 160 10 unit  161 -  190 11 units  191 -  220 12 units  241 -  250 13 units  251 -  280 14 units  281 -  310 15 units  311 -  340 16 units  341 -  370 17 units  371 -  400 18 units  401 - 430 19 units      HOW TO TREAT LOW BLOOD SUGARS (Blood sugar LESS THAN 70 MG/DL) Please follow the RULE OF 15 for the treatment of hypoglycemia treatment (when your (blood sugars are less than 70 mg/dL)   STEP 1: Take 15 grams of carbohydrates when your blood sugar is low, which includes:  3-4 GLUCOSE TABS  OR 3-4 OZ OF JUICE OR REGULAR SODA OR ONE TUBE OF GLUCOSE GEL    STEP 2: RECHECK blood sugar in 15 MINUTES STEP 3: If your blood sugar is still low at the 15 minute recheck --> then, go back to STEP 1 and treat AGAIN with another 15 grams of carbohydrates

## 2022-02-15 NOTE — Addendum Note (Signed)
Addended by: Dorita Sciara on: 02/15/2022 01:53 PM   Modules accepted: Orders

## 2022-02-15 NOTE — Telephone Encounter (Signed)
Incoming fax from [employer] requesting an alternative to [employer] as it is not covered under patient's insurance plan. Please advise on possible alternative

## 2022-02-16 ENCOUNTER — Encounter: Payer: Self-pay | Admitting: Internal Medicine

## 2022-02-17 ENCOUNTER — Ambulatory Visit: Payer: Medicare Other | Admitting: Podiatry

## 2022-02-18 ENCOUNTER — Ambulatory Visit (INDEPENDENT_AMBULATORY_CARE_PROVIDER_SITE_OTHER): Payer: Medicare Other | Admitting: Podiatry

## 2022-02-18 DIAGNOSIS — B351 Tinea unguium: Secondary | ICD-10-CM

## 2022-02-18 DIAGNOSIS — Z794 Long term (current) use of insulin: Secondary | ICD-10-CM

## 2022-02-18 DIAGNOSIS — M79674 Pain in right toe(s): Secondary | ICD-10-CM

## 2022-02-18 DIAGNOSIS — E1142 Type 2 diabetes mellitus with diabetic polyneuropathy: Secondary | ICD-10-CM | POA: Diagnosis not present

## 2022-02-18 DIAGNOSIS — M79675 Pain in left toe(s): Secondary | ICD-10-CM | POA: Diagnosis not present

## 2022-02-18 DIAGNOSIS — L84 Corns and callosities: Secondary | ICD-10-CM | POA: Diagnosis not present

## 2022-02-20 NOTE — Progress Notes (Signed)
Subjective: Chief Complaint  Patient presents with   Diabetes    Diabetic foot care, A1c- 10.0 Bg- 129. Left foot 3rd toe ulcer check. Patient is doing better this visit     61 year old female presents the office today for follow evaluation of preulcerative callus as well as for thick, elongated toenails that she is not able to trim her self.  She states she has been doing well she not seeing any open lesions.  No swelling redness or drainage.   Objective: AAO x3, NAD DP/PT pulses palpable bilaterally, CRT less than 3 seconds Left second toe amputation site is well-healed.  Hammertoes are present bilaterally.  The distal aspect of right third toe, left hallux and left 3rd toe there is hyperkeratotic tissue but upon debridement there is no underlying ulceration drainage or any signs of infection.  No open lesions of the right foot.   Nails appear to be hypertrophic, dystrophic with brown discoloration.  Nails affected are left third through fifth in the right third through fifth as well.  No pain with calf compression, swelling, warmth, erythema  Assessment: Hyperkeratotic lesions, symptomatic onychomycosis  Plan: -All treatment options discussed with the patient including all alternatives, risks, complications.  -Sharp debridement nails x6 without any complications or bleeding -Sharp debride the hyperkeratotic lesions x3 without any complications or bleeding.  Continue offloading.  Monitor for any signs or symptoms of infection or skin breakdown. -Glucose control  Return in about 2 months (around 04/22/2022).  Trula Slade DPM

## 2022-03-04 ENCOUNTER — Ambulatory Visit: Payer: Medicare Other | Admitting: Podiatry

## 2022-03-16 ENCOUNTER — Other Ambulatory Visit: Payer: Self-pay | Admitting: Internal Medicine

## 2022-05-08 NOTE — Progress Notes (Deleted)
Cardiology Office Note:    Date:  05/08/2022   ID:  [name], DOB July 21, 1960, MRN [id number]  PCP:  Sonia Side., [place] Providers Cardiologist:  Sherren Mocha, MD Cardiology APP:  [facility] [place]     Referring MD: Sonia Side., FNP   No chief complaint on file.   History of Present Illness:    [name] is a 61 y.o. female with a hx of: Coronary artery disease  NSTEMI 04/2018 >> Cath w/ diffuse disease >> no targets for CABG >> Med Rx Cath: pLAD 50, mLAD 80, dLAD 70; oD1 95, mLCx 90, dLCx 80, OM2 95, mRCA 30, dRCA 30 PCI could be considered if medical Rx failed Chronic Systolic CHF Echocardiogram 7/13: EF 35-40 Echocardiogram 11/16: EF 55-60 Echocardiogram 8/18: EF 50-55 Echocardiogram 11/19: EF 45-50 Non-ischemic cardiomyopathy (CM out of proportion to extensive CAD)  Aortic atherosclerosis  Morbid obesity  Diabetes mellitus  Hypertension  Hyperlipidemia  Chronic kidney disease Lung nodule (followed by primary care)  Past Medical History:  Diagnosis Date   Adhesive capsulitis of shoulder    bilateral, Steroid injection Dr. Truman Hayward 1/12 bilaterally   CAD (coronary artery disease)    nonobstructive. Last cardiac cath (2008) showing left circumflex with mid 50% stenosis and distal luminal irregularities. Also with RCA with mid to distal 30-40% stenosis. // Previously evaluated by [facility], never followed up outpatient.   CAP (community acquired pneumonia) 06/15/2012   05/2012 CXR: Mild opacification of the posterior lung base on the lateral film  as cannot exclude infection/atelectasis.     CHF (congestive heart failure) ([place])    Diabetes mellitus 2007   Type II, insulin dependent   Diabetic retinopathy    GERD (gastroesophageal reflux disease)    Glaucoma    Hyperlipidemia    Hypertension    Obesity    Peripheral neuropathy     Past Surgical History:  Procedure Laterality Date   ANTERIOR CERVICAL  DECOMP/DISCECTOMY FUSION N/A 06/16/2016   Procedure: Anterior Cervical Decompression/discectomy Fusion - Cervical six - Cervical seven;  Surgeon: Eustace Moore, MD;  Location: [facility];  Service: Neurosurgery;  Laterality: N/A;  Anterior Cervical Decompression/discectomy Fusion - Cervical six - Cervical seven   APPENDECTOMY     CARDIAC CATHETERIZATION     CATARACT EXTRACTION     GLAUCOMA SURGERY     LEFT HEART CATH AND CORONARY ANGIOGRAPHY N/A 05/22/2018   Procedure: LEFT HEART CATH AND CORONARY ANGIOGRAPHY;  Surgeon: Troy Sine, MD;  Location: [facility] [facility] CV LAB;  Service: Cardiovascular;  Laterality: N/A;   LEFT HEART CATHETERIZATION WITH CORONARY ANGIOGRAM N/A 01/24/2012   Procedure: LEFT HEART CATHETERIZATION WITH CORONARY ANGIOGRAM;  Surgeon: Sherren Mocha, MD;  Location: [facility] CATH LAB;  Service: Cardiovascular;  Laterality: N/A;   POSTERIOR CERVICAL FUSION/FORAMINOTOMY N/A 10/22/2015   Procedure: Cervical three-Cervical Seven Posterior cervical fusion with lateral mass fixation,  Laminectomy Cervical Three-Cervical Seven;  Surgeon: Eustace Moore, MD;  Location: [facility] NEURO ORS;  Service: Neurosurgery;  Laterality: N/A;  Posterior   TUBAL LIGATION      Current Medications: No outpatient medications have been marked as taking for the 05/09/22 encounter (Appointment) with Sherren Mocha, MD.     Allergies:   Patient has no known allergies.   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   Tobacco Use   Smoking status: Former    Packs/day: 0.30    Years: 7.00    Total pack years: 2.10    Types: Cigarettes    Quit date: 06/27/2006    Years since quitting: 15.8   Smokeless  tobacco: Never  Vaping Use   Vaping Use: Never used  Substance and Sexual Activity   Alcohol use: No    Alcohol/week: 0.0 standard drinks of alcohol   Drug use: No   Sexual activity: Not Currently  Other Topics Concern   Not on file  Social History Narrative   Lives with her kids, 43yo daughter and 51 yo adopted son.   Social Determinants of Health   Financial Resource Strain: Not on file  Food Insecurity: Not on file  Transportation Needs: Not on file  Physical Activity: Not on file  Stress: Not on file  Social Connections: Not on file     Family History: The patient's ***family history includes Heart disease in her mother; Hypertension in her sister, sister, and sister. There is no history of Heart attack or Stroke.  ROS:   Please see the history of present illness.    *** All other systems reviewed and are negative.  EKGs/Labs/Other Studies Reviewed:    The following studies were reviewed today: ***  EKG:  EKG is *** ordered today.  The ekg ordered today demonstrates ***  Recent Labs: 06/04/2021: TSH 1.71 06/09/2021: ALT 11 11/16/2021: BUN 27; Creatinine, Ser 1.27; Potassium 4.4; Sodium 139 01/07/2022: Hemoglobin 11.6; Platelets 251  Recent Lipid Panel    Component Value Date/Time   CHOL 116 06/09/2021 0813   TRIG 147 06/09/2021 0813   HDL 49 06/09/2021 0813   CHOLHDL 2.4 06/09/2021 0813   CHOLHDL 4.1 05/21/2018 1800   VLDL 28 05/21/2018 1800   LDLCALC 42 06/09/2021 0813     Risk Assessment/Calculations:   {Does this patient have ATRIAL FIBRILLATION?:[id number]}  No BP recorded.  {Refresh Note OR Click here to enter BP  :1}***         Physical Exam:    VS:  LMP 02/13/2011     Wt Readings from Last 3 Encounters:  02/15/22 237 lb (107.5 kg)  11/16/21 226 lb 9.6 oz (102.8 kg)  10/04/21 226 lb (102.5 kg)     GEN: *** Well nourished, well developed in no acute distress HEENT: Normal NECK: No JVD; No carotid bruits LYMPHATICS: No  lymphadenopathy CARDIAC: ***RRR, no murmurs, rubs, gallops RESPIRATORY:  Clear to auscultation without rales, wheezing or rhonchi  ABDOMEN: Soft, non-tender, non-distended MUSCULOSKELETAL:  No edema; No deformity  SKIN: Warm and dry NEUROLOGIC:  Alert and oriented x 3 PSYCHIATRIC:  Normal affect   ASSESSMENT:    1. Coronary artery disease involving native coronary artery of native heart with angina pectoris (HCC)   2. HFmrEF (heart failure with mildly reduced ejection fraction) ([place])   3. Essential hypertension   4. Mixed hyperlipidemia    PLAN:    In order of problems listed above:  ***      {Are you ordering a CV Procedure (e.g. stress test, cath, DCCV, TEE, etc)?   Press F2        :[id number]}    Medication Adjustments/Labs and Tests Ordered: Current medicines  are reviewed at length with the patient today.  Concerns regarding medicines are outlined above.  No orders of the defined types were placed in this encounter.  No orders of the defined types were placed in this encounter.   There are no Patient Instructions on file for this visit.   Signed, Sherren Mocha, MD  05/08/2022 4:52 PM    [place]

## 2022-05-09 ENCOUNTER — Ambulatory Visit: Payer: Medicare Other | Admitting: Cardiovascular Disease

## 2022-05-09 DIAGNOSIS — I5020 Unspecified systolic (congestive) heart failure: Secondary | ICD-10-CM

## 2022-05-09 DIAGNOSIS — I25119 Atherosclerotic heart disease of native coronary artery with unspecified angina pectoris: Secondary | ICD-10-CM

## 2022-05-09 DIAGNOSIS — E782 Mixed hyperlipidemia: Secondary | ICD-10-CM

## 2022-05-09 DIAGNOSIS — I10 Essential (primary) hypertension: Secondary | ICD-10-CM

## 2022-05-11 ENCOUNTER — Ambulatory Visit: Payer: Medicare Other | Attending: Cardiovascular Disease | Admitting: Physician Assistant

## 2022-05-11 ENCOUNTER — Encounter: Payer: Self-pay | Admitting: Physician Assistant

## 2022-05-11 VITALS — BP 118/58 | HR 90 | Ht 66.0 in | Wt 234.6 lb

## 2022-05-11 DIAGNOSIS — Z794 Long term (current) use of insulin: Secondary | ICD-10-CM

## 2022-05-11 DIAGNOSIS — I10 Essential (primary) hypertension: Secondary | ICD-10-CM

## 2022-05-11 DIAGNOSIS — I5020 Unspecified systolic (congestive) heart failure: Secondary | ICD-10-CM

## 2022-05-11 DIAGNOSIS — I25119 Atherosclerotic heart disease of native coronary artery with unspecified angina pectoris: Secondary | ICD-10-CM

## 2022-05-11 DIAGNOSIS — E11319 Type 2 diabetes mellitus with unspecified diabetic retinopathy without macular edema: Secondary | ICD-10-CM

## 2022-05-11 DIAGNOSIS — E782 Mixed hyperlipidemia: Secondary | ICD-10-CM

## 2022-05-11 MED ORDER — METOPROLOL TARTRATE 100 MG PO TABS: 100.0000 mg | ORAL_TABLET | Freq: Two times a day (BID) | ORAL | 3 refills | Status: DC

## 2022-05-11 NOTE — Assessment & Plan Note (Signed)
Managed by endocrinology.

## 2022-05-11 NOTE — Assessment & Plan Note (Signed)
Non-ischemic cardiomyopathy. EF 45-50. Volume status stable. NYHA II-IIb. Continue Jardiance 25 mg once daily (higher dose for diabetes), Imdur 60 mg once daily, Lisinopril 2.5 mg once daily. Increase Metoprolol Tartrate to 100 mg twice daily as noted. F/u in 6 mos.

## 2022-05-11 NOTE — Assessment & Plan Note (Signed)
Status post non-STEMI in November 2019.  She has diffuse small vessel and distal vessel disease which has been managed medically. She is doing well w/o angina on her current regimen. Continue Plavix 75 mg once daily, Imdur 60 mg once daily, Ranexa 500 mg twice daily, Crestor 40 mg once daily. Her HR tends to run high. I will increase Metoprolol Tartrate to 100 mg twice daily. F/u in 6 mos.

## 2022-05-11 NOTE — Patient Instructions (Signed)
Medication Instructions:  Your physician has recommended you make the following change in your medication:   INCREASE Metoprolol to 100 mg twice a day   *If you need a refill on your cardiac medications before your next appointment, please call your pharmacy*   Lab Work: None ordered  If you have labs (blood work) drawn today and your tests are completely normal, you will receive your results only by: [place] (if you have MyChart) OR A paper copy in the mail If you have any lab test that is abnormal or we need to change your treatment, we will call you to review the results.   Testing/Procedures: None ordered   Follow-Up: At [facility], you and your health needs are our priority.  As part of our continuing mission to provide you with exceptional heart care, we have created designated Provider Care Teams.  These Care Teams include your primary Cardiologist (physician) and Advanced Practice Providers (APPs -  Physician Assistants and Nurse Practitioners) who all work together to provide you with the care you need, when you need it.  We recommend signing up for the patient portal called "MyChart".  Sign up information is provided on this After Visit Summary.  MyChart is used to connect with patients for Virtual Visits (Telemedicine).  Patients are able to view lab/test results, encounter notes, upcoming appointments, etc.  Non-urgent messages can be sent to your provider as well.   To learn more about what you can do with MyChart, go to [email or web address].    Your next appointment:   6 month(s)  The format for your next appointment:   In Person  Provider:   Sherren Mocha, MD     Other Instructions   Important Information About Sugar

## 2022-05-11 NOTE — Assessment & Plan Note (Signed)
BP controlled. Adjust Metoprolol dose for coronary artery disease, congestive heart failure. Continue Imdur 60 mg once daily, Lisinopril 2.5 mg once daily.

## 2022-05-11 NOTE — Assessment & Plan Note (Signed)
LDL optimal on most recent lab work.  Continue current Rx.   

## 2022-05-11 NOTE — Progress Notes (Signed)
Cardiology Office Note:    Date:  05/11/2022   ID:  [name], DOB [birth year], MRN [id number]  PCP:  Sonia Side., Bonnie Providers Cardiologist:  Sherren Mocha, MD Cardiology APP:  Sharmon Revere    Referring MD: Sonia Side., FNP   Chief Complaint:  F/u for CAD, CHF    Patient Profile: Coronary artery disease  NSTEMI 04/2018 >> Cath w/ diffuse disease >> no targets for CABG >> Med Rx Cath: pLAD 50, mLAD 80, dLAD 70; oD1 95, mLCx 90, dLCx 71, OM2 95, mRCA 30, dRCA 30 PCI could be considered if medical Rx failed Chronic Systolic CHF Non-ischemic cardiomyopathy (CM out of proportion to extensive CAD)  Echocardiogram 7/13: EF 35-40 Echocardiogram 11/16: EF 55-60 Echocardiogram 8/18: EF 50-55 Echocardiogram 11/19: Mild conc LVH, EF 45-50, diff HK, normal RVSF  Aortic atherosclerosis  ABIs 01/22/20: R 1.01, L 0.96 (normal) Morbid obesity  Diabetes mellitus  Hypertension  Hyperlipidemia  Chronic kidney disease Lung nodule (followed by primary care)    Cardiac Studies & Procedures   CARDIAC CATHETERIZATION  CARDIAC CATHETERIZATION 05/22/2018  Narrative  Ost 1st Diag to 1st Diag lesion is 95% stenosed.  Prox LAD to Mid LAD lesion is 50% stenosed.  Mid LAD lesion is 80% stenosed.  Dist LAD lesion is 70% stenosed.  2nd Mrg lesion is 95% stenosed.  Mid Cx lesion is 90% stenosed.  Dist Cx lesion is 80% stenosed.  Mid RCA lesion is 30% stenosed.  Dist RCA lesion is 30% stenosed.  LV end diastolic pressure is mildly elevated.  The left ventricular ejection fraction is 25-35% by visual estimate.  Severe multivessel CAD with mild calcification of the proximal LAD, long diffuse 90% stenosis in the first diagonal vessel of the LAD, 50% proximal -mid, 80% diffuse mid LAD stenosis and 70% distal LAD stenoses; circumflex vessel with a large second marginal branch with focal 95% stenosis in the distal portion of this marginal  vessel and 90 and 80% mid distal AV groove circumflex stenoses; and mild segmental 30% narrowing before and after the acute margin in the RCA.  New severe LV dysfunction with an EF of 25 to 30% with diffuse hypocontractility involving the mid distal anterolateral wall apex, and apical distal inferior wall and LVEDP 19 mm  RECOMMENDATION: Angiograms were reviewed with colleagues.  In comparison to the prior study of 2013, there is now significant progression of CAD with diffuse stenoses in the diagonal vessel mid distal LAD, distal circumflex marginal, mid distal AV groove circumflex vessels.  Due to the distal disease CABG revascularization is not favorable.  The patient will be gently hydrated post procedure.  She will be started on aggressive medical management initially and if medical therapy fails consider staged PCI.  The patient will be started on carvedilol, nitrates, consider amlodipine and possible ranolazine.  If she is not felt to be an operative candidate dual antiplatelet therapy will be beneficial.  Findings Coronary Findings Diagnostic  Dominance: Right  Left Anterior Descending Prox LAD to Mid LAD lesion is 50% stenosed. Mid LAD lesion is 80% stenosed. Dist LAD lesion is 70% stenosed.  First Diagonal Branch Ost 1st Diag to 1st Diag lesion is 95% stenosed.  Left Circumflex Mid Cx lesion is 90% stenosed. Dist Cx lesion is 80% stenosed.  Second Obtuse Marginal Branch 2nd Mrg lesion is 95% stenosed.  Right Coronary Artery Mid RCA lesion is 30% stenosed. Dist RCA lesion is 30% stenosed.  Intervention  No interventions have been documented.   STRESS TESTS  NM MYOCAR MULTI W/SPECT W 05/01/2015  Narrative CLINICAL DATA:  Chest pain, diabetes, hypertension and hyperlipidemia.  EXAM: MYOCARDIAL IMAGING WITH SPECT (REST AND PHARMACOLOGIC-STRESS)  GATED LEFT VENTRICULAR WALL MOTION STUDY  LEFT VENTRICULAR EJECTION FRACTION  TECHNIQUE: Standard myocardial SPECT  imaging was performed after resting intravenous injection of 10 mCi Tc-64msestamibi. Subsequently, intravenous infusion of Lexiscan was performed under the supervision of the Cardiology staff. At peak effect of the drug, 30 mCi Tc-983mestamibi was injected intravenously and standard myocardial SPECT imaging was performed. Quantitative gated imaging was also performed to evaluate left ventricular wall motion, and estimate left ventricular ejection fraction.  COMPARISON:  None.  FINDINGS: Perfusion: Mild apical and inferior wall attenuation present on both acquisitions. There appears to be a component of diaphragmatic attenuation on the planar images, especially on the stress acquisition. Apical attenuation may be due to normal thinning or breast soft tissue attenuation. There is no evidence of inducible myocardial ischemia.  Wall Motion: Normal left ventricular wall motion. No left ventricular dilation.  Left Ventricular Ejection Fraction: 53 %  End diastolic volume 10034l  End systolic volume 52 ml  IMPRESSION: 1. No evidence of inducible myocardial ischemia. Inferior wall attenuation likely relates to diaphragmatic attenuation. Apical attenuation may relate to apical thinning or breast soft tissue attenuation.  2. Normal left ventricular wall motion.  3. Left ventricular ejection fraction 53%  4. Low-risk stress test findings*.  *2012 Appropriate Use Criteria for Coronary Revascularization Focused Update: J Am Coll Cardiol. 207425;95(6):387-564http://content.onairportbarriers.comspx?articleid=1201161   Electronically Signed By: GlAletta Edouard.D. On: 05/01/2015 15:30   ECHOCARDIOGRAM  ECHOCARDIOGRAM COMPLETE 05/23/2018  Narrative *[facility] [id number]------------------------------------------------------------------- Transthoracic Echocardiography  Patient:    [name], WitkopR #:       [id number] Date: 05/23/2018 Gender:     F Age:        61eight:     167.6 cm Weight:     118.1 kg BSA:        2.4 m^2 Pt. Status: Room:       6E21C  ORAnna GenreM.D. REFERRING    ThShelva MajesticM.D. ADMITTING    [place], JaCibecueERFORMING   [facility], Inpatient SONOGRAPHER  TiRoseanna Rainbowcc:  ------------------------------------------------------------------- LV EF: 45% -   50%  ------------------------------------------------------------------- Indications:      425.4 Other primary Cardiomyopathies.  ------------------------------------------------------------------- History:   PMH:   Dyspnea.  Coronary artery disease.  Congestive heart failure.  Risk factors:  Hypertension. Diabetes mellitus. Morbidly obese. Dyslipidemia.  ------------------------------------------------------------------- Study Conclusions  - Left ventricle: The cavity size was normal. There was mild concentric hypertrophy. Systolic function was mildly reduced. The estimated ejection fraction was in the range of 45% to 50%. Diffuse hypokinesis. Although no diagnostic regional wall motion abnormality was identified, this possibility cannot be completely excluded on the basis of this study. The study is not technically sufficient to allow evaluation of LV diastolic function. - Aortic valve: There was no significant regurgitation. - Mitral valve: There was no significant regurgitation. - Right ventricle: Systolic function was normal. - Atrial septum: No defect or patent foramen ovale was identified.  Impressions:  - Technically difficult study, even with administration of echo contrast (definity). LV EF appears visually mildly reduced, with global mild hypokinesis. Reduced  sensitivity for detection of focal wall motion abnormalities. No significant valvular  disease appreciated.  ------------------------------------------------------------------- Study data:  Comparison was made to the study of 02/07/2017.  Study status:  Routine.  Procedure:  Exam suboptimal even with Definity. The patient reported no pain pre or post test. Transthoracic echocardiography. Image quality was suboptimal. The study was technically difficult, as a result of poor acoustic windows, poor sound wave transmission, and body habitus. Intravenous contrast (Definity) was administered.  Study completion:  There were no complications.          Transthoracic echocardiography.  M-mode, complete 2D, spectral Doppler, and color Doppler.  Birthdate: Patient birthdate: 06-Aug-1960.  Age:  Patient is 61 yr old.  Sex: Gender: female.    BMI: 42 kg/m^2.  Blood pressure:     114/57 Patient status:  Inpatient.  Study date:  Study date: 05/23/2018. Study time: 10:43 AM.  Location:  Bedside.  -------------------------------------------------------------------  ------------------------------------------------------------------- Left ventricle:  Not well visualized. The cavity size was normal. There was mild concentric hypertrophy. Systolic function was mildly reduced. The estimated ejection fraction was in the range of 45% to 50%. Diffuse hypokinesis. Although no diagnostic regional wall motion abnormality was identified, this possibility cannot be completely excluded on the basis of this study. The study is not technically sufficient to allow evaluation of LV diastolic function.  ------------------------------------------------------------------- Aortic valve:   Trileaflet; normal thickness leaflets. Mobility was not restricted.  Doppler:  Transvalvular velocity was within the normal range. There was no stenosis. There was no significant regurgitation.  ------------------------------------------------------------------- Aorta:  Aortic root: The aortic root was normal in  size.  ------------------------------------------------------------------- Mitral valve:   Structurally normal valve.   Mobility was not restricted.  Doppler:  Transvalvular velocity was within the normal range. There was no evidence for stenosis. There was no significant regurgitation.    Valve area by pressure half-time: 4.31 cm^2. Indexed valve area by pressure half-time: 1.79 cm^2/m^2.    Peak gradient (D): 3 mm Hg.  ------------------------------------------------------------------- Left atrium:  The atrium was normal in size.  ------------------------------------------------------------------- Atrial septum:  No defect or patent foramen ovale was identified.  ------------------------------------------------------------------- Right ventricle:  The cavity size was normal. Wall thickness was normal. Systolic function was normal.  ------------------------------------------------------------------- Pulmonic valve:   Poorly visualized.  The valve appears to be grossly normal.    Doppler:  Transvalvular velocity was within the normal range. There was no evidence for stenosis. There was no significant regurgitation.  ------------------------------------------------------------------- Tricuspid valve:   Structurally normal valve.    Doppler: Transvalvular velocity was within the normal range. There was no significant regurgitation.  ------------------------------------------------------------------- Pulmonary artery:   The main pulmonary artery was normal-sized. Systolic pressure could not be accurately estimated.  ------------------------------------------------------------------- Right atrium:  The atrium was normal in size.  ------------------------------------------------------------------- Pericardium:  There was no pericardial effusion.  ------------------------------------------------------------------- Systemic veins: Inferior vena cava: The vessel was normal in size.  The respirophasic diameter changes were in the normal range (>= 50%), consistent with normal central venous pressure.  ------------------------------------------------------------------- Measurements  Left ventricle                         Value          Reference LV ID, ED, PLAX chordal                44    mm       43 - 52 LV ID, ES, PLAX chordal  32    mm       23 - 38 LV fx shortening, PLAX chordal (L)     27    %        >=29 LV PW thickness, ED                    13    mm       ---------- IVS/LV PW ratio, ED                    1              <=1.3 Stroke volume, 2D                      79    ml       ---------- Stroke volume/bsa, 2D                  33    ml/m^2   ---------- LV e&', lateral                         6.74  cm/s     ---------- LV E/e&', lateral                       13.1           ---------- LV e&', medial                          5.33  cm/s     ---------- LV E/e&', medial                        16.57          ---------- LV e&', average                         6.04  cm/s     ---------- LV E/e&', average                       14.63          ----------  Ventricular septum                     Value          Reference IVS thickness, ED                      13    mm       ----------  LVOT                                   Value          Reference LVOT ID, S                             23    mm       ---------- LVOT area                              4.15  cm^2     ---------- LVOT peak velocity, S  99.8  cm/s     ---------- LVOT mean velocity, S                  74.6  cm/s     ---------- LVOT VTI, S                            19.1  cm       ----------  Aorta                                  Value          Reference Aortic root ID, ED                     26    mm       ---------- Ascending aorta ID, A-P, S             26    mm       ----------  Left atrium                            Value          Reference LA ID, A-P, ES                          26    mm       ---------- LA ID/bsa, A-P                         1.08  cm/m^2   <=2.2 LA volume, S                           41.7  ml       ---------- LA volume/bsa, S                       17.3  ml/m^2   ---------- LA volume, ES, 1-p A4C                 43.8  ml       ---------- LA volume/bsa, ES, 1-p A4C             18.2  ml/m^2   ---------- LA volume, ES, 1-p A2C                 34.1  ml       ---------- LA volume/bsa, ES, 1-p A2C             14.2  ml/m^2   ----------  Mitral valve                           Value          Reference Mitral E-wave peak velocity            88.3  cm/s     ---------- Mitral A-wave peak velocity            124   cm/s     ---------- Mitral deceleration time               173   ms       150 - 230 Mitral pressure  half-time              51    ms       ---------- Mitral peak gradient, D                3     mm Hg    ---------- Mitral E/A ratio, peak                 0.7            ---------- Mitral valve area, PHT, DP             4.31  cm^2     ---------- Mitral valve area/bsa, PHT, DP         1.79  cm^2/m^2 ----------  Right atrium                           Value          Reference RA ID, S-I, ES, A4C                    41.4  mm       34 - 49 RA area, ES, A4C                       9.29  cm^2     8.3 - 19.5 RA volume, ES, A/L                     17.2  ml       ---------- RA volume/bsa, ES, A/L                 7.2   ml/m^2   ----------  Right ventricle                        Value          Reference TAPSE                                  18.6  mm       ---------- RV s&', lateral, S                      9.25  cm/s     ----------  Legend: (L)  and  (H)  mark values outside specified reference range.  ------------------------------------------------------------------- Prepared and Electronically Authenticated by  Buford Dresser 2019-11-27T14:44:57              History of Present Illness:   [name] is a 61 y.o. female with the above  problem list.  She was last seen 11/16/21. She returns for f/u. She does get exhausted if she does a lot of heavy lifting. Otherwise, she does not have any energy issues with regular activities. She has not had chest pain, significant dyspnea on exertion, leg edema, syncope. She sleeps on an incline chronically.         Past Medical History:  Diagnosis Date   Adhesive capsulitis of shoulder    bilateral, Steroid injection Dr. Truman Hayward 1/12 bilaterally   CAD (coronary artery disease)    nonobstructive. Last cardiac cath (2008) showing left circumflex with mid 50% stenosis and distal luminal irregularities. Also with RCA with mid to distal 30-40% stenosis. // Previously evaluated by [facility], never followed  up outpatient.   CAP (community acquired pneumonia) 06/15/2012   05/2012 CXR: Mild opacification of the posterior lung base on the lateral film  as cannot exclude infection/atelectasis.     CHF (congestive heart failure) ([place])    Diabetes mellitus 2007   Type II, insulin dependent   Diabetic retinopathy    GERD (gastroesophageal reflux disease)    Glaucoma    Hyperlipidemia    Hypertension    Obesity    Peripheral neuropathy    Current Medications: Current Meds  Medication Sig   Alcohol Swabs (B-D SINGLE USE SWABS REGULAR) PADS Use as directed   amitriptyline (ELAVIL) 25 MG tablet Take 1 tablet (25 mg total) by mouth at bedtime.   azelastine (ASTELIN) 0.1 % nasal spray azelastine 137 mcg (0.1 %) nasal spray aerosol   Blood Glucose Calibration (TRUE METRIX LEVEL 1) Low SOLN Use as directed   Blood Glucose Monitoring Suppl (GNP TRUE METRIX AIR METER) w/Device KIT 1 kit by Does not apply route 4 (four) times daily. E11.42   clopidogrel (PLAVIX) 75 MG tablet Take 1 tablet (75 mg total) by mouth daily.   COMBIGAN 0.2-0.5 % ophthalmic solution Place 1 drop into both eyes 2 (two) times daily.   Continuous Blood Gluc Sensor (DEXCOM G6 SENSOR) MISC 1 Device by Does not apply route as  directed.   Continuous Blood Gluc Transmit (DEXCOM G6 TRANSMITTER) MISC 1 Device by Does not apply route as directed.   cyclobenzaprine (FLEXERIL) 10 MG tablet TAKE 1 TABLET(10 MG) BY MOUTH TWICE DAILY AS NEEDED FOR MUSCLE SPASMS   diphenhydrAMINE (BENADRYL) 25 MG tablet Take 1 tablet (25 mg total) by mouth every 6 (six) hours as needed for itching or allergies.   dorzolamide (TRUSOPT) 2 % ophthalmic solution Place 1 drop into both eyes 2 (two) times daily.   Dulaglutide (TRULICITY) 3 OI/7.1IW SOPN Inject 3 mg as directed once a week.   empagliflozin (JARDIANCE) 25 MG TABS tablet Take 1 tablet (25 mg total) by mouth daily.   fluticasone (FLONASE) 50 MCG/ACT nasal spray Place 1 spray into both nostrils daily.   furosemide (LASIX) 20 MG tablet Take 20 mg by mouth 2 (two) times daily.   gabapentin (NEURONTIN) 300 MG capsule Take 300 mg by mouth 2 (two) times daily.   glucose blood (GNP TRUETRACK TEST STRIPS) test strip Use as instructed   glucose blood (ONETOUCH VERIO) test strip USE TO CHECK BLOOD SUGAR 4 TIMES DAILY AS DIRECTED   glucose blood (TRUE METRIX BLOOD GLUCOSE TEST) test strip Use as instructed   insulin degludec (TRESIBA FLEXTOUCH) 100 UNIT/ML FlexTouch Pen Inject 36 Units into the skin daily.   insulin lispro (HUMALOG) 100 UNIT/ML injection 30 Units 2 (two) times daily.   Insulin Pen Needle 31G X 5 MM MISC 1 Device by Does not apply route in the morning, at noon, in the evening, and at bedtime.   isosorbide mononitrate (IMDUR) 60 MG 24 hr tablet TAKE 1 TABLET EVERY DAY   lisinopril (ZESTRIL) 2.5 MG tablet TAKE 1 TABLET(2.5 MG) BY MOUTH DAILY   metFORMIN (GLUCOPHAGE) 1000 MG tablet Take 1 tablet (1,000 mg total) by mouth 2 (two) times daily with a meal.   metoprolol tartrate (LOPRESSOR) 100 MG tablet Take 1 tablet (100 mg total) by mouth 2 (two) times daily.   montelukast (SINGULAIR) 10 MG tablet montelukast 10 mg tablet   Netarsudil-Latanoprost (ROCKLATAN) 0.02-0.005 % SOLN Rocklatan  0.02 %-0.005 % eye drops   nitroGLYCERIN (NITROSTAT) 0.4 MG SL tablet DISSOLVE  1 TABLET (0.4 MG TOTAL) UNDER THE TONGUE EVERY 5 MINUTES AS NEEDED FOR CHEST PAIN.   ONETOUCH DELICA LANCETS FINE MISC Check blood sugar as instructed up to 3 times a day   oxyCODONE-acetaminophen (PERCOCET/ROXICET) 5-325 MG tablet Take 1 tablet by mouth every 8 (eight) hours as needed for severe pain.   pantoprazole (PROTONIX) 40 MG tablet Take 1 tablet (40 mg total) by mouth daily.   ranolazine (RANEXA) 500 MG 12 hr tablet Take 1 tablet (500 mg total) by mouth 2 (two) times daily.   rosuvastatin (CRESTOR) 40 MG tablet TAKE 1 TABLET(40 MG) BY MOUTH DAILY   SANTYL ointment Apply 1 application topically daily as needed (foot).    TRUEplus Lancets 33G MISC Check 4 times a daily   [DISCONTINUED] metoprolol tartrate (LOPRESSOR) 50 MG tablet TAKE 1 AND 1/2 TABLETS TWICE DAILY    Allergies:   Patient has no known allergies.   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 Got disability in 2007.   Tobacco Use   Smoking status: Former    Packs/day: 0.30    Years: 7.00    Total pack years: 2.10    Types: Cigarettes    Quit date: 06/27/2006    Years since quitting: 15.8   Smokeless tobacco: Never  Vaping Use   Vaping Use: Never used  Substance and Sexual Activity   Alcohol use: No    Alcohol/week: 0.0 standard drinks of alcohol   Drug use: No   Sexual activity: Not Currently    Family Hx: The patient's family history includes Heart disease in her mother; Hypertension in her sister, sister, and sister. There is no history of Heart attack or Stroke.  Review of Systems  Gastrointestinal:  Negative for hematochezia and melena.  Genitourinary:  Negative for hematuria.     EKGs/Labs/Other Test Reviewed:    EKG:  EKG is not ordered today.     Recent Labs: 06/04/2021: TSH 1.71 06/09/2021: ALT  11 11/16/2021: BUN 27; Creatinine, Ser 1.27; Potassium 4.4; Sodium 139 01/07/2022: Hemoglobin 11.6; Platelets 251   Recent Lipid Panel Recent Labs    06/09/21 0813  CHOL 116  TRIG 147  HDL 49  LDLCALC 42      Risk Assessment/Calculations/Metrics:              Physical Exam:    VS:  BP (!) 118/58   Pulse 90   Ht _0  (1.676 m)   Wt 234 lb 9.6 oz (106.4 kg)   LMP 02/13/2011   SpO2 96%   BMI 37.87 kg/m     Wt Readings from Last 3 Encounters:  05/11/22 234 lb 9.6 oz (106.4 kg)  02/15/22 237 lb (107.5 kg)  11/16/21 226 lb 9.6 oz (102.8 kg)    Constitutional:      Appearance: Healthy appearance. Not in distress.  Neck:     Vascular: No carotid bruit or JVR. JVD normal.  Pulmonary:     Effort: Pulmonary effort is normal.     Breath sounds: No wheezing. No rales.  Cardiovascular:     Normal rate. Regular rhythm. Normal S1. Normal S2.      Murmurs: There is no murmur.  Edema:    Peripheral edema absent.  Abdominal:     Palpations: Abdomen is soft.  Skin:    General: Skin is warm and dry.  Neurological:  Mental Status: Alert and oriented to person, place and time.         ASSESSMENT & PLAN:   CAD (coronary artery disease) Status post non-STEMI in November 2019.  She has diffuse small vessel and distal vessel disease which has been managed medically. She is doing well w/o angina on her current regimen. Continue Plavix 75 mg once daily, Imdur 60 mg once daily, Ranexa 500 mg twice daily, Crestor 40 mg once daily. Her HR tends to run high. I will increase Metoprolol Tartrate to 100 mg twice daily. F/u in 6 mos.  HFmrEF (heart failure with mildly reduced ejection fraction) (HCC) Non-ischemic cardiomyopathy. EF 45-50. Volume status stable. NYHA II-IIb. Continue Jardiance 25 mg once daily (higher dose for diabetes), Imdur 60 mg once daily, Lisinopril 2.5 mg once daily. Increase Metoprolol Tartrate to 100 mg twice daily as noted. F/u in 6 mos.    Hyperlipidemia LDL  optimal on most recent lab work.  Continue current Rx.    Essential hypertension BP controlled. Adjust Metoprolol dose for coronary artery disease, congestive heart failure. Continue Imdur 60 mg once daily, Lisinopril 2.5 mg once daily.   Type 2 diabetes mellitus with retinopathy, with long-term current use of insulin ([place]) Managed by endocrinology.              Dispo:  Return in about 6 months (around 11/09/2022) for Routine Follow Up, w/ Dr. Burt Knack, or Richardson Dopp, PA-C.   Medication Adjustments/Labs and Tests Ordered: Current medicines are reviewed at length with the patient today.  Concerns regarding medicines are outlined above.  Tests Ordered: No orders of the defined types were placed in this encounter.  Medication Changes: Meds ordered this encounter  Medications   metoprolol tartrate (LOPRESSOR) 100 MG tablet    Sig: Take 1 tablet (100 mg total) by mouth 2 (two) times daily.    Dispense:  180 tablet    Refill:  3   Signed, Richardson Dopp, PA-C  05/11/2022 2:27 PM    [address], [place], [place]  [address] Phone: [phone]; Fax: [phone]

## 2022-07-20 ENCOUNTER — Other Ambulatory Visit: Payer: Self-pay

## 2022-07-20 MED ORDER — DEXCOM G7 RECEIVER DEVI: 0 refills | Status: DC

## 2022-08-18 NOTE — Progress Notes (Signed)
Name: [name]  Age/ Sex: 62 y.o., female   MRN/ DOB: [id number], 1961-03-02     PCP: Sonia Side., FNP   Reason for Endocrinology Evaluation: Type 2 Diabetes Mellitus  Initial Endocrine Consultative Visit: 10/07/2019    PATIENT IDENTIFIER: [name] is a 62 y.o. female with a past medical history of HTn and T2DM. The patient has followed with Endocrinology clinic since 10/07/2019 for consultative assistance with management of her diabetes.      DIABETIC HISTORY:  [name] was diagnosed with gestational diabetes in 1984, requiring insulin , she was off insulin until her next pregnancy in 1985 and has been on insulin ever since. Her hemoglobin A1c has ranged from 8.1% in 2016, peaking at 12.5% in 2020.   On her initial visit to our clinic, she had an A1c of 9.6%. She was on MDI regimen as well as victoza and metformin, which were continued and added jardiance.   Developed yeast infections to Jardiance 12/2019  Switched victoza to trulicity [id number]  SUBJECTIVE:   During the last visit (02/15/2022): A1c 10.0 %    Today (08/19/2022): [name] is here for a follow up on diabetes management.  She checks her blood sugars multiple times daily through CGM. She has  hypoglycemic episodes since her last visit. She is symptomatic with these episodes.   She was seen by cardiology 05/11/2022 She had a follow-up with podiatry 02/18/2022 Denies nausea, vomiting or diarrhea   Pending knee replacement    HOME DIABETES REGIMEN:  Tresiba  36 units daily Humalog 10 units with each meal Jardiance 25 mg daily  Metformin 1000 mg twice daily  Trulicity 3.0 mg weekly ( Fridays)     Statin: yes ACE-I/ARB: yes     CONTINUOUS GLUCOSE MONITORING RECORD INTERPRETATION    Dates of Recording:2/10-2/23/2024  Sensor description:dexcom  Results statistics:   CGM use % of time 93  Average and SD 216/55  Time in range 26%  % Time Above 180 48  % Time above 250 26   % Time Below target 0     Glycemic patterns summary: hyperglycemia noted during the day and night   Hyperglycemic episodes  postprandial and night   Hypoglycemic episodes occurred n/a   Overnight periods:remains high       Microvascular complications:  Neuropathy, hx of left foot ulcer, retinopathy  Denies: CKD  Last eye exam: Completed 06/2019   Macrovascular complications:  CAD, CHF Denies:  PVD, CVA       HISTORY:  Past Medical History:  Past Medical History:  Diagnosis Date   Adhesive capsulitis of shoulder    bilateral, Steroid injection Dr. Truman Hayward 1/12 bilaterally   CAD (coronary artery disease)    nonobstructive. Last cardiac cath (2008) showing left circumflex with mid 50% stenosis and distal luminal irregularities. Also with RCA with mid to distal 30-40% stenosis. // Previously evaluated by [facility], never followed up outpatient.   CAP (community acquired pneumonia) 06/15/2012   05/2012 CXR: Mild opacification of the posterior lung base on the lateral film  as cannot exclude infection/atelectasis.     CHF (congestive heart failure) ([place])    Diabetes mellitus 2007   Type II, insulin dependent   Diabetic retinopathy    GERD (gastroesophageal reflux disease)    Glaucoma    Hyperlipidemia    Hypertension    Obesity    Peripheral neuropathy    Past Surgical History:  Past Surgical History:  Procedure Laterality Date  ANTERIOR CERVICAL DECOMP/DISCECTOMY FUSION N/A 06/16/2016   Procedure: Anterior Cervical Decompression/discectomy Fusion - Cervical six - Cervical seven;  Surgeon: Eustace Moore, MD;  Location: [facility];  Service: Neurosurgery;  Laterality: N/A;  Anterior Cervical Decompression/discectomy Fusion - Cervical six - Cervical seven   APPENDECTOMY     CARDIAC CATHETERIZATION     CATARACT EXTRACTION     GLAUCOMA SURGERY     LEFT HEART CATH AND CORONARY ANGIOGRAPHY N/A 05/22/2018   Procedure: LEFT HEART CATH AND CORONARY ANGIOGRAPHY;  Surgeon:  Troy Sine, MD;  Location: [place] CV LAB;  Service: Cardiovascular;  Laterality: N/A;   LEFT HEART CATHETERIZATION WITH CORONARY ANGIOGRAM N/A 01/24/2012   Procedure: LEFT HEART CATHETERIZATION WITH CORONARY ANGIOGRAM;  Surgeon: Sherren Mocha, MD;  Location: [facility] C[facility];  Service: Cardiovascular;  Laterality: N/A;   POSTERIOR CERVICAL FUSION/FORAMINOTOMY N/A 10/22/2015   Procedure: Cervical three-Cervical Seven Posterior cervical fusion with lateral mass fixation,  Laminectomy Cervical Three-Cervical Seven;  Surgeon: Eustace Moore, MD;  Location: [place] [facility];  Service: Neurosurgery;  Laterality: N/A;  Posterior   TUBAL LIGATION     Social History:  reports that she quit smoking about 16 years ago. Her smoking use included cigarettes. She has a 2.10 pack-year smoking history. She has never used smokeless tobacco. She reports that she does not drink alcohol and does not use drugs. Family History:  Family History  Problem Relation Age of Onset   Heart disease Mother    Hypertension Sister    Hypertension Sister    Hypertension Sister    Heart attack Neg Hx    Stroke Neg Hx      HOME MEDICATIONS: Allergies as of 08/19/2022   No Known Allergies      Medication List        Accurate as of August 19, 2022  7:58 AM. If you have any questions, ask your nurse or doctor.          STOP taking these medications    cyclobenzaprine 10 MG tablet Commonly known as: FLEXERIL Stopped by: Dorita Sciara, MD   Dexcom G6 Sensor Misc Stopped by: Dorita Sciara, MD       TAKE these medications    amitriptyline 25 MG tablet Commonly known as: ELAVIL Take 1 tablet (25 mg total) by mouth at bedtime.   azelastine 0.1 % nasal spray Commonly known as: ASTELIN azelastine 137 mcg (0.1 %) nasal spray aerosol   B-D SINGLE USE SWABS REGULAR Pads Use as directed   clopidogrel 75 MG tablet Commonly known as: PLAVIX Take 1 tablet (75 mg total) by mouth daily.    Combigan 0.2-0.5 % ophthalmic solution Generic drug: brimonidine-timolol Place 1 drop into both eyes 2 (two) times daily.   Dexcom G6 Transmitter Misc 1 Device by Does not apply route as directed.   Dexcom G7 Receiver Devi Use to check blood sugars   diphenhydrAMINE 25 MG tablet Commonly known as: BENADRYL Take 1 tablet (25 mg total) by mouth every 6 (six) hours as needed for itching or allergies.   dorzolamide 2 % ophthalmic solution Commonly known as: TRUSOPT Place 1 drop into both eyes 2 (two) times daily.   empagliflozin 25 MG Tabs tablet Commonly known as: Jardiance Take 1 tablet (25 mg total) by mouth daily.   fluticasone 50 MCG/ACT nasal spray Commonly known as: Flonase Place 1 spray into both nostrils daily.   furosemide 20 MG tablet Commonly known as: LASIX Take 20 mg by mouth  2 (two) times daily.   gabapentin 300 MG capsule Commonly known as: NEURONTIN Take 300 mg by mouth 2 (two) times daily.   glucose blood test strip Commonly known as: OneTouch Verio USE TO CHECK BLOOD SUGAR 4 TIMES DAILY AS DIRECTED   GNP Truetrack Test Strips test strip Generic drug: glucose blood Use as instructed   True Metrix Blood Glucose Test test strip Generic drug: glucose blood Use as instructed   GNP True Metrix Air Meter w/Device Kit 1 kit by Does not apply route 4 (four) times daily. E11.42   insulin lispro 100 UNIT/ML injection Commonly known as: HUMALOG 30 Units 2 (two) times daily.   Insulin Pen Needle 31G X 5 MM Misc 1 Device by Does not apply route in the morning, at noon, in the evening, and at bedtime.   isosorbide mononitrate 60 MG 24 hr tablet Commonly known as: IMDUR TAKE 1 TABLET EVERY DAY   lisinopril 2.5 MG tablet Commonly known as: ZESTRIL TAKE 1 TABLET(2.5 MG) BY MOUTH DAILY   metFORMIN 1000 MG tablet Commonly known as: GLUCOPHAGE Take 1 tablet (1,000 mg total) by mouth 2 (two) times daily with a meal.   metoprolol tartrate 100 MG  tablet Commonly known as: LOPRESSOR Take 1 tablet (100 mg total) by mouth 2 (two) times daily.   montelukast 10 MG tablet Commonly known as: SINGULAIR montelukast 10 mg tablet   nitroGLYCERIN 0.4 MG SL tablet Commonly known as: NITROSTAT DISSOLVE 1 TABLET (0.4 MG TOTAL) UNDER THE TONGUE EVERY 5 MINUTES AS NEEDED FOR CHEST PAIN.   OneTouch Delica Lancets Fine Misc Check blood sugar as instructed up to 3 times a day   TRUEplus Lancets 33G Misc Check 4 times a daily   oxyCODONE-acetaminophen 5-325 MG tablet Commonly known as: PERCOCET/ROXICET Take 1 tablet by mouth every 8 (eight) hours as needed for severe pain.   pantoprazole 40 MG tablet Commonly known as: PROTONIX Take 1 tablet (40 mg total) by mouth daily.   ranolazine 500 MG 12 hr tablet Commonly known as: Ranexa Take 1 tablet (500 mg total) by mouth 2 (two) times daily.   Rocklatan 0.02-0.005 % Soln Generic drug: Netarsudil-Latanoprost Rocklatan 0.02 %-0.005 % eye drops   rosuvastatin 40 MG tablet Commonly known as: CRESTOR TAKE 1 TABLET(40 MG) BY MOUTH DAILY   Santyl 250 UNIT/GM ointment Generic drug: collagenase Apply 1 application topically daily as needed (foot).   Tyler Aas FlexTouch 100 UNIT/ML FlexTouch Pen Generic drug: insulin degludec Inject 36 Units into the skin daily.   True Metrix Level 1 Low Soln Use as directed   Trulicity 3 0000000 Sopn Generic drug: Dulaglutide Inject 3 mg as directed once a week.         OBJECTIVE:   Vital Signs: BP 124/70 (BP Location: Left Arm, Patient Position: Sitting, Cuff Size: Large)   Pulse 68   Ht '5\' 6"'$  (1.676 m)   Wt 239 lb (108.4 kg)   LMP 02/13/2011   SpO2 99%   BMI 38.58 kg/m   Wt Readings from Last 3 Encounters:  08/19/22 239 lb (108.4 kg)  05/11/22 234 lb 9.6 oz (106.4 kg)  02/15/22 237 lb (107.5 kg)     Exam: General: Pt appears well and is in NAD  Lungs: CTA  Heart: RRR  Extremities: No   pretibial edema.   Neuro: MS is good with  appropriate affect, pt is alert and Ox3      DM foot exam: per podiatry 02/18/2022    DATA REVIEWED:  Lab Results  Component Value Date   HGBA1C 9.6 (A) 08/19/2022   HGBA1C 10.0 (A) 02/15/2022   HGBA1C 11.3 (A) 10/04/2021   03/22/2022 BUN 30 Cr. 1.240 GFR 48    ASSESSMENT / PLAN / RECOMMENDATIONS:   1) Type 2 Diabetes Mellitus, Poorly controlled, With retinopathic,  neuropathic, and macrovascular  complications complications - Most recent A1c of 9.6%. Goal A1c < 7.0 %.     - A1c continues to be above goal but its trending down   - She was advised again on avoiding sugar-sweetened beverages  -I will increase her basal insulin,as well as her prandial insulin  - She was encouraged the use the correction scale before each meal rather than after the meal   MEDICATIONS: - Increase Tresiba to 40 units daily  - Increase Humalog 12 units with each meal  - Continue  Jardiance  25 mg daily - Continue Metformin 1000 mg twice daily  - Continue Trulicity  3.0  mg ONCE WEEKLY - CF : Humalog  (BG -130/30) TIDQAC   EDUCATION / INSTRUCTIONS: BG monitoring instructions: Patient is instructed to check her blood sugars 4 times a day, before meals and bedtime  Call [facility] if: BG persistently < 70  I reviewed the Rule of 15 for the treatment of hypoglycemia in detail with the patient. Literature supplied.    2) Diabetic complications:  Eye: Does have known diabetic retinopathy.  Neuro/ Feet: Does have known diabetic peripheral neuropathy .  Renal: Patient does not have known baseline CKD. She   is on an ACEI/ARB at present.      F/U in 6 months    Signed electronically by: Mack Guise, MD  [facility]  [address] [place], [place] [address] Phone: [phone] FAX: [phone]   CC: [address] [place] [address] Phone: [phone]  Fax:  [phone]  Return to Endocrinology clinic as below: Future Appointments  Date Time Provider [place]  09/09/2022 10:15 AM Trula Slade, DPM [facility]

## 2022-08-19 ENCOUNTER — Encounter: Payer: Self-pay | Admitting: Internal Medicine

## 2022-08-19 ENCOUNTER — Ambulatory Visit (INDEPENDENT_AMBULATORY_CARE_PROVIDER_SITE_OTHER): Payer: 59 | Admitting: Internal Medicine

## 2022-08-19 VITALS — BP 124/70 | HR 68 | Ht 66.0 in | Wt 239.0 lb

## 2022-08-19 DIAGNOSIS — Z794 Long term (current) use of insulin: Secondary | ICD-10-CM | POA: Diagnosis not present

## 2022-08-19 DIAGNOSIS — E1122 Type 2 diabetes mellitus with diabetic chronic kidney disease: Secondary | ICD-10-CM

## 2022-08-19 DIAGNOSIS — E1142 Type 2 diabetes mellitus with diabetic polyneuropathy: Secondary | ICD-10-CM | POA: Diagnosis not present

## 2022-08-19 DIAGNOSIS — N1831 Chronic kidney disease, stage 3a: Secondary | ICD-10-CM

## 2022-08-19 DIAGNOSIS — E1159 Type 2 diabetes mellitus with other circulatory complications: Secondary | ICD-10-CM | POA: Diagnosis not present

## 2022-08-19 DIAGNOSIS — E11319 Type 2 diabetes mellitus with unspecified diabetic retinopathy without macular edema: Secondary | ICD-10-CM | POA: Diagnosis not present

## 2022-08-19 LAB — POCT GLYCOSYLATED HEMOGLOBIN (HGB A1C): Hemoglobin A1C: 9.6 % — AB (ref 4.0–5.6)

## 2022-08-19 MED ORDER — TRESIBA FLEXTOUCH 100 UNIT/ML ~~LOC~~ SOPN: 40.0000 [IU] | PEN_INJECTOR | Freq: Every day | SUBCUTANEOUS | 3 refills | Status: DC

## 2022-08-19 MED ORDER — INSULIN PEN NEEDLE 31G X 5 MM MISC: 1.0000 | Freq: Four times a day (QID) | 3 refills | Status: DC

## 2022-08-19 MED ORDER — INSULIN LISPRO (1 UNIT DIAL) 100 UNIT/ML (KWIKPEN): PEN_INJECTOR | SUBCUTANEOUS | 3 refills | Status: DC

## 2022-08-19 NOTE — Patient Instructions (Signed)
-   Increase  Tresiba to 40 units daily  - Continue  Jardiance  25 mg daily - Continue Metformin 1000 mg twice daily  - Continue  Trulicity 3.0  mg ONCE WEEKLY  -Novolog correctional insulin:  Use the scale below to help guide you BEFORE each meal   Blood sugar before meal Number of units to inject  Less than 160 12 unit  161 -  190 13 units  191 -  220 14 units  241 -  250 15 units  251 -  280 16 units  281 -  310 17 units  311 -  340 18 units  341 -  370 19 units  371 -  400 20  units  401 - 430 21 units      HOW TO TREAT LOW BLOOD SUGARS (Blood sugar LESS THAN 70 MG/DL) Please follow the RULE OF 15 for the treatment of hypoglycemia treatment (when your (blood sugars are less than 70 mg/dL)   STEP 1: Take 15 grams of carbohydrates when your blood sugar is low, which includes:  3-4 GLUCOSE TABS  OR 3-4 OZ OF JUICE OR REGULAR SODA OR ONE TUBE OF GLUCOSE GEL    STEP 2: RECHECK blood sugar in 15 MINUTES STEP 3: If your blood sugar is still low at the 15 minute recheck --> then, go back to STEP 1 and treat AGAIN with another 15 grams of carbohydrates

## 2022-09-09 ENCOUNTER — Ambulatory Visit (INDEPENDENT_AMBULATORY_CARE_PROVIDER_SITE_OTHER): Payer: 59 | Admitting: Podiatry

## 2022-09-09 DIAGNOSIS — B351 Tinea unguium: Secondary | ICD-10-CM | POA: Diagnosis not present

## 2022-09-09 DIAGNOSIS — M79675 Pain in left toe(s): Secondary | ICD-10-CM | POA: Diagnosis not present

## 2022-09-09 DIAGNOSIS — L84 Corns and callosities: Secondary | ICD-10-CM

## 2022-09-09 DIAGNOSIS — M2042 Other hammer toe(s) (acquired), left foot: Secondary | ICD-10-CM

## 2022-09-09 DIAGNOSIS — Z794 Long term (current) use of insulin: Secondary | ICD-10-CM | POA: Diagnosis not present

## 2022-09-09 DIAGNOSIS — E1142 Type 2 diabetes mellitus with diabetic polyneuropathy: Secondary | ICD-10-CM | POA: Diagnosis not present

## 2022-09-09 DIAGNOSIS — M79674 Pain in right toe(s): Secondary | ICD-10-CM

## 2022-09-09 DIAGNOSIS — M2041 Other hammer toe(s) (acquired), right foot: Secondary | ICD-10-CM

## 2022-09-09 DIAGNOSIS — Z872 Personal history of diseases of the skin and subcutaneous tissue: Secondary | ICD-10-CM

## 2022-09-09 NOTE — Progress Notes (Signed)
Subjective: Chief Complaint  Patient presents with   routine diabetic foot care     Left foot callouse      62 year old female presents the office today for follow evaluation of preulcerative callus as well as for thick, elongated toenails that she is not able to trim her self.  Denies any open sores or any swelling redness or drainage.  No fevers or chills.  No other concerns.   Objective: AAO x3, NAD DP/PT pulses palpable bilaterally, CRT less than 3 seconds Left second toe amputation site is well-healed.  At the distal aspect of right third toe, left hallux and left 3rd toe there is hyperkeratotic tissue but upon debridement there is no underlying ulceration drainage or any signs of infection.  No open lesions of the right foot.   Nails appear to be hypertrophic, dystrophic with brown discoloration.  Nails affected are left third through fifth in the right third through fifth as well.   Hammertoes present No pain with calf compression, swelling, warmth, erythema  Assessment: Hyperkeratotic lesions, symptomatic onychomycosis  Plan: -All treatment options discussed with the patient including all alternatives, risks, complications.  -Sharp debridement nails x6 without any complications or bleeding -Sharp debride the hyperkeratotic lesions x3 without any complications or bleeding.   -I do think she went from diabetic shoes.  Will schedule measurements as not able to be done today. -Daily foot inspection, glucose control.  Return in about 3 months (around 12/10/2022).  Trula Slade DPM

## 2022-09-11 ENCOUNTER — Other Ambulatory Visit: Payer: Self-pay | Admitting: Internal Medicine

## 2022-10-03 ENCOUNTER — Other Ambulatory Visit: Payer: Self-pay | Admitting: Cardiovascular Disease

## 2022-10-07 ENCOUNTER — Other Ambulatory Visit: Payer: 59

## 2022-11-02 ENCOUNTER — Ambulatory Visit (INDEPENDENT_AMBULATORY_CARE_PROVIDER_SITE_OTHER): Payer: 59

## 2022-11-02 ENCOUNTER — Encounter: Payer: Self-pay | Admitting: Podiatry

## 2022-11-02 ENCOUNTER — Ambulatory Visit (INDEPENDENT_AMBULATORY_CARE_PROVIDER_SITE_OTHER): Payer: 59 | Admitting: Podiatry

## 2022-11-02 DIAGNOSIS — Z794 Long term (current) use of insulin: Secondary | ICD-10-CM

## 2022-11-02 DIAGNOSIS — E1142 Type 2 diabetes mellitus with diabetic polyneuropathy: Secondary | ICD-10-CM

## 2022-11-02 DIAGNOSIS — L97522 Non-pressure chronic ulcer of other part of left foot with fat layer exposed: Secondary | ICD-10-CM

## 2022-11-02 MED ORDER — DOXYCYCLINE HYCLATE 100 MG PO TABS: 100.0000 mg | ORAL_TABLET | Freq: Two times a day (BID) | ORAL | 0 refills | Status: AC

## 2022-11-02 NOTE — Progress Notes (Signed)
Subjective:  Patient ID: [name], female    DOB: 08-09-1960,   MRN: [id number]  Chief Complaint  Patient presents with   Callouses    Left great toe callus pain with discharge and swelling. Patient states there is constant pain associated with some throbbing.    62 y.o. female presents for concern of left great toe callus that has been painful and toe swollen with discharge present. Relates she has been putting peroxide on the toe but denies any other dressing. History of amputation on the left second toe. . Denies any other pedal complaints. Denies n/v/f/c.   Past Medical History:  Diagnosis Date   Adhesive capsulitis of shoulder    bilateral, Steroid injection Dr. Nedra Hai 1/12 bilaterally   CAD (coronary artery disease)    nonobstructive. Last cardiac cath (2008) showing left circumflex with mid 50% stenosis and distal luminal irregularities. Also with RCA with mid to distal 30-40% stenosis. // Previously evaluated by [facility], never followed up outpatient.   CAP (community acquired pneumonia) 06/15/2012   05/2012 CXR: Mild opacification of the posterior lung base on the lateral film  as cannot exclude infection/atelectasis.     CHF (congestive heart failure) ([facility])    Diabetes mellitus 2007   Type II, insulin dependent   Diabetic retinopathy    GERD (gastroesophageal reflux disease)    Glaucoma    Hyperlipidemia    Hypertension    Obesity    Peripheral neuropathy     Objective:  Physical Exam: Vascular: DP/PT pulses 2/4 bilateral. CFT <3 seconds. Absent hair growth on digits. Edema noted to bilateral lower extremities. Xerosis noted bilaterally.  Skin. No lacerations or abrasions bilateral feet. Nails 1-5 bilateral  are thickened discolored and with subungual debris.  Musculoskeletal: MMT 5/5 bilateral lower extremities in DF, PF, Inversion and Eversion. Deceased ROM in DF of ankle joint. Partial amputation of left second digit. Distal left hallux with ulceration  noted about 2 cm x 1 cm x 0.3 cm with fibrous base. Surrounding hyperkeratosis. No erythema noted minimal edema and no purulence noted. No probe to bone currently.  Neurological: Sensation intact to light touch. Protective sensation diminished bilateral.    Assessment:   1. Skin ulcer of left great toe with fat layer exposed ([facility])   2. Type 2 diabetes mellitus with diabetic polyneuropathy, with long-term current use of insulin ([facility])      Plan:  Patient was evaluated and treated and all questions answered. Ulcer left hallux with fat layer exposed  -Debridement as below. -X-rays reviewed. Osseous erosion noted to distal left hallux phalanx concern for osteomyelitiis.  -Dressed with betadine, DSD. -Off-loading with surgical shoe. Dispensed.  -Doxycycline sent to pharmacy  ABIs ordered.  Discussed concern for bone infection and concern for possible need of amputation.  Will order MRI to further evaluate.  -Discussed glucose control and proper protein-rich diet.  -Discussed if any worsening redness, pain, fever or chills to call or may need to report to the emergency room. Patient expressed understanding.   Procedure: Excisional Debridement of Wound Rationale: Removal of non-viable soft tissue from the wound to promote healing.  Anesthesia: none Pre-Debridement Wound Measurements: Overlying callus  Post-Debridement Wound Measurements: 1 cm x 2 cm x 0.3 cm  Type of Debridement: Sharp Excisional Tissue Removed: Non-viable soft tissue Depth of Debridement: subcutaneous tissue. Technique: Sharp excisional debridement to bleeding, viable wound base.  Dressing: Dry, sterile, compression dressing. Disposition: Patient tolerated procedure well. Patient to return in 2 week for follow-up.  Return in about 2 weeks (around 11/16/2022) for wound check.   Louann Sjogren, DPM

## 2022-11-06 ENCOUNTER — Ambulatory Visit
Admission: RE | Admit: 2022-11-06 | Discharge: 2022-11-06 | Disposition: A | Discharge status: Home / Self Care | Payer: 59 | Source: Ambulatory Visit | Attending: Podiatry | Admitting: Podiatry

## 2022-11-06 DIAGNOSIS — L97522 Non-pressure chronic ulcer of other part of left foot with fat layer exposed: Secondary | ICD-10-CM

## 2022-11-06 DIAGNOSIS — E1142 Type 2 diabetes mellitus with diabetic polyneuropathy: Secondary | ICD-10-CM

## 2022-11-07 ENCOUNTER — Telehealth: Payer: Self-pay

## 2022-11-07 NOTE — Telephone Encounter (Signed)
Patient called today and would like to know the results of her MRI.

## 2022-11-07 NOTE — Telephone Encounter (Signed)
Called patient with results. I advised her to call back to get scheduled for follow-up to discuss surgery for the toe. She stated she wanted to see Dr. Ardelle Anton as he has taken care of her in the past. Hopefully we can get her in this week. Discussed with her as well if any worsening to head to the emergency room.

## 2022-11-07 NOTE — Telephone Encounter (Signed)
Can you get her schedule with Dr. Ardelle Anton this week or next week please

## 2022-11-10 ENCOUNTER — Ambulatory Visit (INDEPENDENT_AMBULATORY_CARE_PROVIDER_SITE_OTHER): Payer: 59 | Admitting: Podiatry

## 2022-11-10 DIAGNOSIS — M869 Osteomyelitis, unspecified: Secondary | ICD-10-CM

## 2022-11-10 MED ORDER — DOXYCYCLINE HYCLATE 100 MG PO TABS: 100.0000 mg | ORAL_TABLET | Freq: Two times a day (BID) | ORAL | 0 refills | Status: DC

## 2022-11-10 MED ORDER — CIPROFLOXACIN HCL 500 MG PO TABS
500.0000 mg | ORAL_TABLET | Freq: Two times a day (BID) | ORAL | 0 refills | Status: DC
Start: 2022-11-10 — End: 2022-11-23

## 2022-11-10 MED ORDER — MUPIROCIN 2 % EX OINT: 1.0000 | TOPICAL_OINTMENT | Freq: Two times a day (BID) | CUTANEOUS | 2 refills | Status: DC

## 2022-11-10 NOTE — Patient Instructions (Addendum)
Pre-Operative Instructions  Congratulations, you have decided to take an important step to improving your quality of life.  You can be assured that the doctors of [facility] will be with you every step of the way.  Plan to be at the surgery center/hospital at least 1 (one) hour prior to your scheduled time unless otherwise directed by the surgical center/hospital staff.  You must have a responsible adult accompany you, remain during the surgery and drive you home.  Make sure you have directions to the surgical center/hospital and know how to get there on time. For hospital based surgery you will need to obtain a history and physical form from your family physician within 1 month prior to the date of surgery- we will give you a form for you primary physician.  We make every effort to accommodate the date you request for surgery.  There are however, times where surgery dates or times have to be moved.  We will contact you as soon as possible if a change in schedule is required.   No Aspirin/Ibuprofen for one week before surgery.  If you are on aspirin, any non-steroidal anti-inflammatory medications (Mobic, Aleve, Ibuprofen) you should stop taking it 7 days prior to your surgery.  You make take Tylenol  For pain prior to surgery.  Medications- If you are taking daily heart and blood pressure medications, seizure, reflux, allergy, asthma, anxiety, pain or diabetes medications, make sure the surgery center/hospital is aware before the day of surgery so they may notify you which medications to take or avoid the day of surgery. No food or drink after midnight the night before surgery unless directed otherwise by surgical center/hospital staff. No alcoholic beverages 24 hours prior to surgery.  No smoking 24 hours prior to or 24 hours after surgery. Wear loose pants or shorts- loose enough to fit over bandages, boots, and casts. No slip on shoes, sneakers are best. Bring your boot with you to the  surgery center/hospital.  Also bring crutches or a walker if your physician has prescribed it for you.  If you do not have this equipment, it will be provided for you after surgery. If you have not been contracted by the surgery center/hospital by the day before your surgery, call to confirm the date and time of your surgery. Leave-time from work may vary depending on the type of surgery you have.  Appropriate arrangements should be made prior to surgery with your employer. Prescriptions will be provided immediately following surgery by your doctor.  Have these filled as soon as possible after surgery and take the medication as directed. Remove nail polish on the operative foot. Wash the night before surgery.  The night before surgery wash the foot and leg well with the antibacterial soap provided and water paying special attention to beneath the toenails and in between the toes.  Rinse thoroughly with water and dry well with a towel.  Perform this wash unless told not to do so by your physician.  Enclosed: 1 Ice pack (please put in freezer the night before surgery)   1 Hibiclens skin cleaner   Pre-op Instructions  If you have any questions regarding the instructions, do not hesitate to call our office at any point during this process.   [place]: [address] [place], [place] [address] [phone]  [place]: [address] [place], [place] [address] [phone]  Dr. Corean Yoshimura, DPM  

## 2022-11-11 ENCOUNTER — Telehealth: Payer: Self-pay | Admitting: Podiatry

## 2022-11-11 ENCOUNTER — Ambulatory Visit (HOSPITAL_COMMUNITY): Admission: RE | Admit: 2022-11-11 | Payer: 59 | Source: Ambulatory Visit

## 2022-11-11 NOTE — Telephone Encounter (Signed)
Called patient and sounds like she is planning to have surgery 5/22 with Dr. Ardelle Anton. My name was on some other paperwork. So she should be good for next Wednesday.

## 2022-11-11 NOTE — Telephone Encounter (Signed)
Patient called this morning, stating she has an upcoming surgery schedule with you.  She said she usually saw Dr. Ardelle Anton and didn't realize the surgery was scheduled with you.  She just wants a call from you so she can speak with you before surgery.  [phone].  Thanks - Celisse Ciulla

## 2022-11-13 NOTE — Progress Notes (Signed)
Subjective: Chief Complaint  Patient presents with   Foot Ulcer    Rm 11 Left great toe ulcer. Pt states she is doing well. Ulcer has improved but aware that she will need surgery.    62 year old female presents For above concerns.  She last saw Dr. Ralene Cork and MRI was ordered she presents today to discuss results.  She denies any fevers or chills.  He has been on doxycycline.   Objective: AAO x3, NAD DP/PT pulses palpable bilaterally, CRT less than 3 seconds Ulceration at the distal aspect of the left hallux with localized edema.  There is no probing to bone, and or tunneling today the wound is granular.  There is no drainage or pus.  There is no fluctuation or crepitation.  There is no malodor. No pain with calf compression, swelling, warmth, erythema  Assessment: Osteomyelitis left hallux  Plan: -All treatment options discussed with the patient including all alternatives, risks, complications.  -X-rays were obtained reviewed today.  3 views of the foot were obtained.  There is cortical to distraction suggestive of osteomyelitis.  This also corresponds to the MRI that was recently done. -We discussed trying to salvage the toe versus amputation and hide discussed with amputation of toe at least partial versus total and she wants to proceed with this. -The incision placement as well as the postoperative course was discussed with the patient. I discussed risks of the surgery which include, but not limited to, infection, bleeding, pain, swelling, need for further surgery, delayed or nonhealing, painful or ugly scar, numbness or sensation changes, recurrence, transfer lesions, further deformity, DVT/PE, loss of toe/foot. Patient understands these risks and wishes to proceed with surgery. The surgical consent was reviewed with the patient all 3 pages were signed. No promises or guarantees were given to the outcome of the procedure. All questions were answered to the best of my ability. Before the  surgery the patient was encouraged to call the office if there is any further questions. The surgery will be performed on outpatient basis.  Vivi Barrack DPM

## 2022-11-15 ENCOUNTER — Telehealth: Payer: Self-pay | Admitting: Urology

## 2022-11-15 NOTE — Telephone Encounter (Signed)
DOS - 11/23/22  AMPUTATION TOE LEFT --- [address]109  [employer] EFFECTIVE DATE - 06/27/22  DEDUCTIBLE - $240.00 W/ $ 0.00 REMAINING OOP - $8,850.00 W/ $8,525.72 REMAINING COINSURANCE - 20%  PER [employer] WEBSITE FOR CPT CODE 60454 Notification or Prior Authorization is not required for the requested services You are not required to submit a notification/prior authorization based on the information provided. If you have general questions about the prior authorization requirements, visit [email or web address] > Clinician Resources > Advance and Admission Notification Requirements. The number above acknowledges your notification. Please write this reference number down for future reference. If you would like to request an organization determination, please call us at [phone].   Decision ID #: [id number]

## 2022-11-16 ENCOUNTER — Ambulatory Visit: Payer: 59 | Admitting: Podiatry

## 2022-11-23 ENCOUNTER — Other Ambulatory Visit: Payer: Self-pay | Admitting: Podiatry

## 2022-11-23 DIAGNOSIS — M86672 Other chronic osteomyelitis, left ankle and foot: Secondary | ICD-10-CM | POA: Diagnosis not present

## 2022-11-23 MED ORDER — DOXYCYCLINE HYCLATE 100 MG PO TABS
100.0000 mg | ORAL_TABLET | Freq: Two times a day (BID) | ORAL | 0 refills | Status: DC
Start: 2022-11-23 — End: 2023-01-10

## 2022-11-23 MED ORDER — PROMETHAZINE HCL 25 MG PO TABS: 25.0000 mg | ORAL_TABLET | Freq: Three times a day (TID) | ORAL | 0 refills | Status: DC | PRN

## 2022-11-23 MED ORDER — HYDROCODONE-ACETAMINOPHEN 5-325 MG PO TABS: 1.0000 | ORAL_TABLET | Freq: Four times a day (QID) | ORAL | 0 refills | Status: DC | PRN

## 2022-11-23 NOTE — Progress Notes (Signed)
Postop medications sent 

## 2022-11-28 ENCOUNTER — Encounter: Payer: Self-pay | Admitting: Podiatry

## 2022-11-28 ENCOUNTER — Ambulatory Visit (HOSPITAL_COMMUNITY): Payer: 59

## 2022-11-28 ENCOUNTER — Ambulatory Visit (INDEPENDENT_AMBULATORY_CARE_PROVIDER_SITE_OTHER): Payer: 59

## 2022-11-28 ENCOUNTER — Ambulatory Visit (INDEPENDENT_AMBULATORY_CARE_PROVIDER_SITE_OTHER): Payer: 59 | Admitting: Podiatry

## 2022-11-28 VITALS — Ht 66.0 in

## 2022-11-28 DIAGNOSIS — M869 Osteomyelitis, unspecified: Secondary | ICD-10-CM

## 2022-11-28 NOTE — Progress Notes (Signed)
Subjective: Chief Complaint  Patient presents with   Post-op Problem   Post-op Follow-up    Feels better since surgery, has a little pain    62 year old female presents For above concerns.  She is status post left partial hallux amputation performed on Nov 23, 2022.  No fevers or chills.  Objective: AAO x3, NAD DP/PT pulses palpable bilaterally, CRT less than 3 seconds Post partial hallux amputation.  Sutures intact without any evidence of dehiscence.  There is no drainage or pus.  Minimal edema.  No ascending cellulitis.  No fluctuation, crepitation, malodor. No pain with calf compression, swelling, warmth, erythema  Assessment: Status post partial hallux amputation  Plan: -All treatment options discussed with the patient including all alternatives, risks, complications.  -X-rays were obtained and reviewed.  3 views of the foot were obtained.  Status post hallux IPJ amputation. -Xeroform was applied followed by dressing.  She can keep the dressing clean, dry, intact. -Continue surgical shoe, limited weightbearing, elevation. -Finish course of doxycyline. -Monitor for any clinical signs or symptoms of infection and directed to call the office immediately should any occur or go to the ER. -Patient encouraged to call the office with any questions, concerns, change in symptoms.   Possible suture removal next appointment  Vivi Barrack DPM

## 2022-12-01 ENCOUNTER — Encounter: Payer: Self-pay | Admitting: Podiatry

## 2022-12-05 ENCOUNTER — Ambulatory Visit (HOSPITAL_COMMUNITY): Payer: 59 | Attending: Podiatry

## 2022-12-08 ENCOUNTER — Ambulatory Visit (INDEPENDENT_AMBULATORY_CARE_PROVIDER_SITE_OTHER): Payer: 59 | Admitting: Podiatry

## 2022-12-08 DIAGNOSIS — L600 Ingrowing nail: Secondary | ICD-10-CM | POA: Diagnosis not present

## 2022-12-08 DIAGNOSIS — M869 Osteomyelitis, unspecified: Secondary | ICD-10-CM | POA: Diagnosis not present

## 2022-12-08 NOTE — Progress Notes (Signed)
Subjective: Chief Complaint  Patient presents with   Routine Post Op    POV #2 DOS 11/23/2022 LT BIG TOE AMPUTATION   Nail Problem    Diabetic Foot Care/Nail trim      62 year old female presents for the above concerns.  She is status post left partial hallux amputation performed on Nov 23, 2022.  No fevers or chills.    Nails also thickened elongated causing discomfort.  Objective: AAO x3, NAD DP/PT pulses palpable bilaterally, CRT less than 3 seconds Post partial hallux amputation.  Sutures intact without any evidence of dehiscence.  There is no drainage or pus.  Minimal edema.  No ascending cellulitis.  No fluctuation, crepitation, malodor. On the remaining toes the nails are hypertrophic, dystrophic and elongated. Along the Right Foot Nails Incurvation Lateral Nail Border.  There Is No Edema, Erythema or Signs of Infection. No pain with calf compression, swelling, warmth, erythema  Assessment: Status post partial hallux amputation; onychomycosis/ingrown toenail  Plan: -All treatment options discussed with the patient including all alternatives, risks, complications.  -I left the sutures intact today.  Will currently given his next appointment.  Antibiotic ointment was applied followed by dressing.  Discussed daily dressing changes with a similar dressing.  Manage surgical shoe, elevation, offloading at all times in surgical shoe. -I sharply debrided the remaining toenails today.  There is no complications or bleeding except on the right fourth toe on the right ingrown toenail there was minimal bleeding.  Patient was made aware of this, and daughter who is present.  Antibiotic ointment and dressing applied.  Discussed daily dressing changes. -Monitor for any clinical signs or symptoms of infection and directed to call the office immediately should any occur or go to the ER.  Return for suture removal in 7-10 days.  Vivi Barrack DPM

## 2022-12-12 ENCOUNTER — Ambulatory Visit: Payer: 59 | Admitting: Podiatry

## 2022-12-15 ENCOUNTER — Ambulatory Visit (HOSPITAL_COMMUNITY): Admission: RE | Admit: 2022-12-15 | Payer: 59 | Source: Ambulatory Visit

## 2022-12-15 ENCOUNTER — Telehealth (HOSPITAL_COMMUNITY): Payer: Self-pay

## 2022-12-15 NOTE — Telephone Encounter (Signed)
Attempted contact this patient 4 times over one month to attempt to schedule their VAS [place] order. Unable to reach patient for scheduling purposes. Will inform referring office that the order will be canceled per office protocol.   5/17 no show 6/3 no show 6/10 cancel 6/20 no show

## 2022-12-21 MED ORDER — SEMAGLUTIDE (1 MG/DOSE) 4 MG/3ML ~~LOC~~ SOPN: 1.0000 mg | PEN_INJECTOR | SUBCUTANEOUS | 3 refills | Status: DC

## 2022-12-22 ENCOUNTER — Ambulatory Visit (INDEPENDENT_AMBULATORY_CARE_PROVIDER_SITE_OTHER): Payer: 59 | Admitting: Podiatry

## 2022-12-22 DIAGNOSIS — M869 Osteomyelitis, unspecified: Secondary | ICD-10-CM | POA: Diagnosis not present

## 2022-12-26 NOTE — Progress Notes (Signed)
Subjective: Chief Complaint  Patient presents with   Routine Post Op    POV #3 DOS 11/23/2022 LT BIG TOE AMPUTATION.  Sutures are intact. Pain occasionally.     62 year old female presents for the above concerns.  She is status post left partial hallux amputation performed on Nov 23, 2022.  She presents today for possible suture removal.  No fevers or chills.  No other concerns today.  Other toes are doing well.   Objective: AAO x3, NAD DP/PT pulses palpable bilaterally, CRT less than 3 seconds Post partial hallux amputation.  Sutures intact without any evidence of dehiscence.  Sutures are intact.  There is slight edema.  No erythema or warmth.  There is no drainage or pus or ascending cellulitis.  No fluctuation, crepitation, malodor.  No pain with calf compression, swelling, warmth, erythema  Assessment: Status post partial hallux amputation; onychomycosis  Plan: -All treatment options discussed with the patient including all alternatives, risks, complications.  -Removed the sutures without complications.  Incisions healing well.  Small amount of antibiotic ointment followed by a drainage.  Discussed washing with soap and water apply a similar bandage. Continue with surgical shoe for now -Elevate -Monitor for any clinical signs or symptoms of infection and directed to call the office immediately should any occur or go to the ER.  Return in about 2 weeks (around 01/05/2023) for follow up from amputation .  Vivi Barrack DPM

## 2023-01-10 ENCOUNTER — Ambulatory Visit (INDEPENDENT_AMBULATORY_CARE_PROVIDER_SITE_OTHER): Payer: 59 | Admitting: Podiatry

## 2023-01-10 DIAGNOSIS — M869 Osteomyelitis, unspecified: Secondary | ICD-10-CM

## 2023-01-10 MED ORDER — DOXYCYCLINE HYCLATE 100 MG PO TABS: 100.0000 mg | ORAL_TABLET | Freq: Two times a day (BID) | ORAL | 0 refills | Status: DC

## 2023-01-10 NOTE — Progress Notes (Signed)
Subjective: Chief Complaint  Patient presents with   Diabetic Ulcer    Patient came in today for left foot osteomyelitis, follow-up, A1c- 9.5, BG- 152, patient denies any pain, no drainage    62 year old female presents for the above concerns.  No drainage or pus that she reports.  No fevers or chills.  She is still wearing a surgical shoe.   Objective: AAO x3, NAD DP/PT pulses palpable bilaterally, CRT less than 3 seconds Post partial hallux amputation.  Cooperative dry skin, calcification at distal aspect and upon debridement there is more superficial.  Skin breakdown with very minimal drainage underneath this area once I cleaned this no further drainage or pus is noted.  There is no surrounding erythema, ascending cellulitis.  There is no fluctuation or crepitation.  No malodor. No pain with calf compression, swelling, warmth, erythema  Assessment: Status post partial hallux amputation  Plan: -All treatment options discussed with the patient including all alternatives, risks, complications.  -Severity of the hyperkeratotic tissue and there was 1 small superficial area of skin breakdown minimal drainage.  After debriding this no further drainage or pus was noted today.  Recommend antibiotic ointment dressing changes daily.  Refill doxycycline. -Remain in surgical shoe -Monitor for any clinical signs or symptoms of infection and directed to call the office immediately should any occur or go to the ER.  Return in about 2 years (around 01/09/2025) for toe ulcer left big toe, x-ray.  Vivi Barrack DPM

## 2023-01-17 ENCOUNTER — Encounter: Payer: Self-pay | Admitting: Podiatry

## 2023-01-24 ENCOUNTER — Other Ambulatory Visit: Payer: Self-pay | Admitting: Cardiovascular Disease

## 2023-01-25 MED ORDER — LISINOPRIL 2.5 MG PO TABS: ORAL_TABLET | ORAL | 1 refills | Status: DC

## 2023-01-26 ENCOUNTER — Ambulatory Visit (INDEPENDENT_AMBULATORY_CARE_PROVIDER_SITE_OTHER): Payer: 59

## 2023-01-26 ENCOUNTER — Ambulatory Visit (INDEPENDENT_AMBULATORY_CARE_PROVIDER_SITE_OTHER): Payer: 59 | Admitting: Podiatry

## 2023-01-26 DIAGNOSIS — M869 Osteomyelitis, unspecified: Secondary | ICD-10-CM | POA: Diagnosis not present

## 2023-01-26 DIAGNOSIS — M86072 Acute hematogenous osteomyelitis, left ankle and foot: Secondary | ICD-10-CM | POA: Diagnosis not present

## 2023-01-26 MED ORDER — AMMONIUM LACTATE 12 % EX CREA: 1.0000 | TOPICAL_CREAM | CUTANEOUS | 2 refills | Status: DC | PRN

## 2023-01-27 ENCOUNTER — Telehealth: Payer: Self-pay | Admitting: Podiatry

## 2023-01-27 NOTE — Telephone Encounter (Signed)
Pharmacy called to get clarification on how many times per day to apply the Lac-Hydrin cream.  I called back and left message for BID applications.  Thank you -

## 2023-01-30 NOTE — Progress Notes (Signed)
Subjective: Chief Complaint  Patient presents with   Foot Ulcer    L big toe      62 year old female presents for the above concerns.  The patient is doing well.  She thinks it is healing better.  She has dry skin, callus along incision but denies any drainage or pus.  No open lesions that she notes.  No other concerns.    Objective: AAO x3, NAD DP/PT pulses palpable bilaterally, CRT less than 3 seconds Post partial hallux amputation.  Scar is formed but there is dry, callus tissue on the incision which is able to debride today and there is no underlying ulceration drainage or any signs of infection.  It appears the ulceration, incision is healed.  There is no change approximately signs of infection.  No fluctuation, crepitation, malodor. No pain with calf compression, swelling, warmth, erythema  Assessment: Status post partial hallux, healing well.   Plan: -All treatment options discussed with the patient including all alternatives, risks, complications.  -X-ray obtained reviewed.  3 views of the foot were obtained.  No evidence of acute fracture.  No cortical destruction suggest osteomyelitis of the hallux.  No soft tissue emphysema.  Digital contractures noted.  Heel spur present. -Sharply debrided the hyperkeratotic lesion without any complications or bleeding. -Patient is to continue for now.  will transition to regular shoe as tolerated. -Elevation -Monitor for any clinical signs or symptoms of infection and directed to call the office immediately should any occur or go to the ER.  Return in about 4 weeks (around 02/23/2023) for follow up from toe amputation .  Vivi Barrack DPM

## 2023-02-04 ENCOUNTER — Other Ambulatory Visit: Payer: Self-pay | Admitting: Internal Medicine

## 2023-02-14 ENCOUNTER — Other Ambulatory Visit: Payer: Self-pay

## 2023-02-14 ENCOUNTER — Ambulatory Visit
Admission: EM | Admit: 2023-02-14 | Discharge: 2023-02-14 | Disposition: A | Discharge status: Home / Self Care | Payer: 59 | Attending: Family Medicine | Admitting: Family Medicine

## 2023-02-14 ENCOUNTER — Encounter: Payer: Self-pay | Admitting: *Deleted

## 2023-02-14 DIAGNOSIS — B349 Viral infection, unspecified: Secondary | ICD-10-CM

## 2023-02-14 DIAGNOSIS — I509 Heart failure, unspecified: Secondary | ICD-10-CM | POA: Diagnosis not present

## 2023-02-14 DIAGNOSIS — I110 Hypertensive heart disease with heart failure: Secondary | ICD-10-CM | POA: Diagnosis not present

## 2023-02-14 DIAGNOSIS — E785 Hyperlipidemia, unspecified: Secondary | ICD-10-CM | POA: Diagnosis not present

## 2023-02-14 DIAGNOSIS — Z8249 Family history of ischemic heart disease and other diseases of the circulatory system: Secondary | ICD-10-CM | POA: Insufficient documentation

## 2023-02-14 DIAGNOSIS — Z1152 Encounter for screening for COVID-19: Secondary | ICD-10-CM

## 2023-02-14 DIAGNOSIS — Z87891 Personal history of nicotine dependence: Secondary | ICD-10-CM | POA: Insufficient documentation

## 2023-02-14 DIAGNOSIS — Z20822 Contact with and (suspected) exposure to covid-19: Secondary | ICD-10-CM

## 2023-02-14 DIAGNOSIS — U071 COVID-19: Secondary | ICD-10-CM | POA: Diagnosis not present

## 2023-02-14 DIAGNOSIS — E1165 Type 2 diabetes mellitus with hyperglycemia: Secondary | ICD-10-CM

## 2023-02-14 DIAGNOSIS — Z794 Long term (current) use of insulin: Secondary | ICD-10-CM | POA: Insufficient documentation

## 2023-02-14 DIAGNOSIS — Z7984 Long term (current) use of oral hypoglycemic drugs: Secondary | ICD-10-CM | POA: Diagnosis not present

## 2023-02-14 DIAGNOSIS — I2510 Atherosclerotic heart disease of native coronary artery without angina pectoris: Secondary | ICD-10-CM | POA: Diagnosis not present

## 2023-02-14 LAB — SARS CORONAVIRUS 2 (TAT 6-24 HRS): SARS Coronavirus 2: POSITIVE — AB

## 2023-02-14 MED ORDER — GUAIFENESIN ER 1200 MG PO TB12: 1.0000 | ORAL_TABLET | Freq: Two times a day (BID) | ORAL | 1 refills | Status: AC | PRN

## 2023-02-14 MED ORDER — BENZONATATE 200 MG PO CAPS: 200.0000 mg | ORAL_CAPSULE | Freq: Three times a day (TID) | ORAL | 0 refills | Status: DC | PRN

## 2023-02-14 NOTE — Discharge Instructions (Addendum)
Your COVID 19 results will be available in 24 hours. Negative results are immediately resulted to Mychart. Positive results will receive a follow-up call from our clinic. If symptoms are present, I recommend home quarantine until results are known.  Continue with symptoms treatment as discussed and per medication orders. I will send anti-viral medications once I receive a confirmatory positive results indicating an active COVID infection. Continue hydrate well with fluids. If fever develops take tylenol 650 mg every 4-6 hours as needed. Emergency department for any blood sugars >400 or meter read high. COVID can cause excessively elevated blood sugar readings.

## 2023-02-14 NOTE — ED Triage Notes (Signed)
Bodyaches, headache, chills- "feeling bad" since Sunday. Daughter is +covid

## 2023-02-14 NOTE — ED Provider Notes (Signed)
[name]    CSN: [id number] Arrival date & time: 02/14/23  8657      History   Chief Complaint Chief Complaint  Patient presents with   Generalized Body Aches    HPI [name] is a 62 y.o. female.   HPI Patient presents today with concerns for possible COVID infection.  She reports that her daughter who takes her all of her medical appointments recently tested positive for COVID.  Her symptoms started 3 days ago.  She endorses symptoms of bodyaches, headaches, chills, nonproductive cough, generally feeling unwell.  She endorses some increased work of breathing with activity.  Patient has an extensive medical history including coronary artery disease, diabetes, congestive heart failure, hyperlipidemia and obesity.  Patient goes to [facility] health therefore her recent labs are not available on the system.  Patient would benefit from antiviral therapy.  Patient consents to having blood work to check renal function. Past Medical History:  Diagnosis Date   Adhesive capsulitis of shoulder    bilateral, Steroid injection Dr. Nedra Hai 1/12 bilaterally   CAD (coronary artery disease)    nonobstructive. Last cardiac cath (2008) showing left circumflex with mid 50% stenosis and distal luminal irregularities. Also with RCA with mid to distal 30-40% stenosis. // Previously evaluated by [facility], never followed up outpatient.   CAP (community acquired pneumonia) 06/15/2012   05/2012 CXR: Mild opacification of the posterior lung base on the lateral film  as cannot exclude infection/atelectasis.     CHF (congestive heart failure) (HCC)    Diabetes mellitus 2007   Type II, insulin dependent   Diabetic retinopathy    GERD (gastroesophageal reflux disease)    Glaucoma    Hyperlipidemia    Hypertension    Obesity    Peripheral neuropathy     Patient Active Problem List   Diagnosis Date Noted   Sinus tachycardia 11/16/2021   Diabetes mellitus (HCC) 10/07/2019    Type 2 diabetes mellitus with diabetic polyneuropathy, with long-term current use of insulin (HCC) 10/07/2019   Type 2 diabetes mellitus with retinopathy, with long-term current use of insulin (HCC) 10/07/2019   Dyslipidemia 10/07/2019   Old MI (myocardial infarction)    Shortness of breath 05/21/2018   Chronic lower back pain 02/21/2018   Chronic pain of right knee 01/23/2018   Cough 08/23/2017   Rib pain on right side 06/28/2017   Lateral pain of right hip 04/27/2017   Right foot ulcer (HCC) 10/26/2016   Constipation 09/30/2016   History of colonic polyps 09/30/2016   Right foot pain 07/08/2016   Non-alcoholic fatty liver disease 05/13/2016   Spinal stenosis in cervical region 09/29/2015   Bilateral low back pain with sciatica    Long-term current use of opiate analgesic 07/11/2014   Onychomycosis 10/29/2013   Severe nonproliferative diabetic retinopathy (HCC) 04/04/2013   Primary open angle glaucoma 04/04/2013   Breast cancer screening 12/05/2012   Pain and swelling of left knee 10/29/2012   Insomnia 06/15/2012   HFmrEF (heart failure with mildly reduced ejection fraction) (HCC) 01/30/2012   CAD (coronary artery disease) 11/02/2011   Preventative health care 07/13/2011   Adhesive capsulitis of right shoulder 11/16/2010   Gastroesophageal reflux disease 10/12/2009   Uncontrolled type 2 diabetes with neuropathy 06/12/2006   Hyperlipidemia 06/12/2006   OBESITY 06/12/2006   Essential hypertension 06/12/2006    Past Surgical History:  Procedure Laterality Date   ANTERIOR CERVICAL DECOMP/DISCECTOMY FUSION N/A 06/16/2016   Procedure: Anterior Cervical Decompression/discectomy Fusion -  Cervical six - Cervical seven;  Surgeon: Tia Alert, MD;  Location: [facility] OR;  Service: Neurosurgery;  Laterality: N/A;  Anterior Cervical Decompression/discectomy Fusion - Cervical six - Cervical seven   APPENDECTOMY     CARDIAC CATHETERIZATION     CATARACT EXTRACTION     GLAUCOMA SURGERY      LEFT HEART CATH AND CORONARY ANGIOGRAPHY N/A 05/22/2018   Procedure: LEFT HEART CATH AND CORONARY ANGIOGRAPHY;  Surgeon: Lennette Bihari, MD;  Location: [facility] INVASIVE CV LAB;  Service: Cardiovascular;  Laterality: N/A;   LEFT HEART CATHETERIZATION WITH CORONARY ANGIOGRAM N/A 01/24/2012   Procedure: LEFT HEART CATHETERIZATION WITH CORONARY ANGIOGRAM;  Surgeon: Tonny Bollman, MD;  Location: [facility] CATH LAB;  Service: Cardiovascular;  Laterality: N/A;   POSTERIOR CERVICAL FUSION/FORAMINOTOMY N/A 10/22/2015   Procedure: Cervical three-Cervical Seven Posterior cervical fusion with lateral mass fixation,  Laminectomy Cervical Three-Cervical Seven;  Surgeon: Tia Alert, MD;  Location: [facility] NEURO ORS;  Service: Neurosurgery;  Laterality: N/A;  Posterior   TUBAL LIGATION      OB History   No obstetric history on file.      Home Medications    Prior to Admission medications   Medication Sig Start Date End Date Taking? Authorizing Provider  benzonatate (TESSALON) 200 MG capsule Take 1 capsule (200 mg total) by mouth 3 (three) times daily as needed for cough. 02/14/23  Yes Bing Neighbors, NP  Guaifenesin (MUCINEX MAXIMUM STRENGTH) 1200 MG TB12 Take 1 tablet (1,200 mg total) by mouth every 12 (twelve) hours as needed. 02/14/23  Yes Bing Neighbors, NP  Alcohol Swabs (B-D SINGLE USE SWABS REGULAR) PADS Use as directed 06/30/21   Shamleffer, Konrad Dolores, MD  amitriptyline (ELAVIL) 25 MG tablet Take 1 tablet (25 mg total) by mouth at bedtime. 01/10/18   Tyson Alias, MD  ammonium lactate (AMLACTIN) 12 % cream Apply 1 Application topically as needed for dry skin. 01/26/23   Vivi Barrack, DPM  azelastine (ASTELIN) 0.1 % nasal spray azelastine 137 mcg (0.1 %) nasal spray aerosol    [provider]  Blood Glucose Calibration (TRUE METRIX LEVEL 1) Low SOLN Use as directed 06/30/21   Shamleffer, Konrad Dolores, MD  Blood Glucose Monitoring Suppl (GNP TRUE METRIX AIR METER) w/Device KIT 1  kit by Does not apply route 4 (four) times daily. E11.42 06/30/21   Shamleffer, Konrad Dolores, MD  clopidogrel (PLAVIX) 75 MG tablet Take 1 tablet (75 mg total) by mouth daily. 08/21/18   Alben Spittle, Scott T, PA-C  COMBIGAN 0.2-0.5 % ophthalmic solution Place 1 drop into both eyes 2 (two) times daily. 03/22/18   [provider]  Continuous Blood Gluc Receiver (DEXCOM G7 RECEIVER) DEVI Use to check blood sugars 07/20/22   Shamleffer, Konrad Dolores, MD  Continuous Blood Gluc Transmit (DEXCOM G6 TRANSMITTER) MISC 1 Device by Does not apply route as directed. 10/04/21   Shamleffer, Konrad Dolores, MD  diphenhydrAMINE (BENADRYL) 25 MG tablet Take 1 tablet (25 mg total) by mouth every 6 (six) hours as needed for itching or allergies. 06/19/18   Fawze, Mina A, PA-C  dorzolamide (TRUSOPT) 2 % ophthalmic solution Place 1 drop into both eyes 2 (two) times daily. 03/22/18   [provider]  doxycycline (VIBRA-TABS) 100 MG tablet Take 1 tablet (100 mg total) by mouth 2 (two) times daily. Patient not taking: Reported on 01/26/2023 01/10/23   Vivi Barrack, DPM  empagliflozin (JARDIANCE) 25 MG TABS tablet Take 1 tablet (25 mg total) by  mouth daily. 02/15/22   Shamleffer, Konrad Dolores, MD  fluticasone (FLONASE) 50 MCG/ACT nasal spray Place 1 spray into both nostrils daily. 11/10/17 05/11/22  Fuller Plan, MD  furosemide (LASIX) 20 MG tablet Take 20 mg by mouth 2 (two) times daily.    [provider]  gabapentin (NEURONTIN) 300 MG capsule Take 300 mg by mouth 2 (two) times daily.    [provider]  glucose blood (GNP TRUETRACK TEST STRIPS) test strip Use as instructed 07/07/21   Shamleffer, Konrad Dolores, MD  glucose blood (ONETOUCH VERIO) test strip USE TO CHECK BLOOD SUGAR 4 TIMES DAILY AS DIRECTED 01/30/18   Tyson Alias, MD  glucose blood (TRUE METRIX BLOOD GLUCOSE TEST) test strip Use as instructed 07/08/21   Shamleffer, Konrad Dolores, MD   HYDROcodone-acetaminophen (NORCO/VICODIN) 5-325 MG tablet Take 1-2 tablets by mouth every 6 (six) hours as needed. 11/23/22   Vivi Barrack, DPM  insulin degludec (TRESIBA FLEXTOUCH) 100 UNIT/ML FlexTouch Pen Inject 40 Units into the skin daily. 02/06/23   Shamleffer, Konrad Dolores, MD  insulin lispro (HUMALOG KWIKPEN) 100 UNIT/ML KwikPen Max daily 60 units 08/19/22   Shamleffer, Konrad Dolores, MD  Insulin Pen Needle 31G X 5 MM MISC 1 Device by Does not apply route in the morning, at noon, in the evening, and at bedtime. 08/19/22   Shamleffer, Konrad Dolores, MD  isosorbide mononitrate (IMDUR) 60 MG 24 hr tablet TAKE 1 TABLET EVERY DAY 12/10/20   Tonny Bollman, MD  lisinopril (ZESTRIL) 2.5 MG tablet TAKE 1 TABLET(2.5 MG) BY MOUTH DAILY 01/25/23   Tonny Bollman, MD  metFORMIN (GLUCOPHAGE) 1000 MG tablet Take 1 tablet (1,000 mg total) by mouth 2 (two) times daily with a meal. 02/15/22   Shamleffer, Konrad Dolores, MD  metoprolol tartrate (LOPRESSOR) 100 MG tablet Take 1 tablet (100 mg total) by mouth 2 (two) times daily. 05/11/22   Tonny Bollman, MD  montelukast (SINGULAIR) 10 MG tablet montelukast 10 mg tablet    [provider]  mupirocin ointment (BACTROBAN) 2 % Apply 1 Application topically 2 (two) times daily. 11/10/22   Vivi Barrack, DPM  Netarsudil-Latanoprost (ROCKLATAN) 0.02-0.005 % SOLN Rocklatan 0.02 %-0.005 % eye drops    [provider]  nitroGLYCERIN (NITROSTAT) 0.4 MG SL tablet DISSOLVE 1 TABLET (0.4 MG TOTAL) UNDER THE TONGUE EVERY 5 MINUTES AS NEEDED FOR CHEST PAIN. 12/10/20   Tonny Bollman, MD  [facility] DELICA LANCETS FINE MISC Check blood sugar as instructed up to 3 times a day 03/14/16   Fuller Plan, MD  pantoprazole (PROTONIX) 40 MG tablet Take 1 tablet (40 mg total) by mouth daily. 03/08/18   Yvette Rack, MD  promethazine (PHENERGAN) 25 MG tablet Take 1 tablet (25 mg total) by mouth every 8 (eight) hours as needed for nausea or vomiting.  11/23/22   Vivi Barrack, DPM  ranolazine (RANEXA) 500 MG 12 hr tablet Take 1 tablet (500 mg total) by mouth 2 (two) times daily. 11/16/21   Tereso Newcomer T, PA-C  rosuvastatin (CRESTOR) 40 MG tablet TAKE 1 TABLET(40 MG) BY MOUTH DAILY 10/04/22   Tereso Newcomer T, PA-C  SANTYL ointment Apply 1 application topically daily as needed (foot).  08/22/18   [provider]  Semaglutide, 1 MG/DOSE, 4 MG/3ML SOPN Inject 1 mg as directed once a week. 12/21/22   Shamleffer, Konrad Dolores, MD  TRUEplus Lancets 33G MISC Check 4 times a daily 06/30/21   Shamleffer, Konrad Dolores, MD    Family History  Family History  Problem Relation Age of Onset   Heart disease Mother    Hypertension Sister    Hypertension Sister    Hypertension Sister    Heart attack Neg Hx    Stroke Neg Hx     Social History Social History   Tobacco Use   Smoking status: Former    Current packs/day: 0.00    Average packs/day: 0.3 packs/day for 7.0 years (2.1 ttl pk-yrs)    Types: Cigarettes    Start date: 06/28/1999    Quit date: 06/27/2006    Years since quitting: 16.6   Smokeless tobacco: Never  Vaping Use   Vaping status: Never Used  Substance Use Topics   Alcohol use: No    Alcohol/week: 0.0 standard drinks of alcohol   Drug use: No     Allergies   Patient has no known allergies.  Review of Systems Review of Systems Pertinent negatives listed in HPI   Physical Exam Triage Vital Signs ED Triage Vitals  Encounter Vitals Group     BP 02/14/23 1006 116/62     Systolic BP Percentile --      Diastolic BP Percentile --      Pulse Rate 02/14/23 1006 90     Resp 02/14/23 1006 20     Temp 02/14/23 1006 98.7 F (37.1 C)     Temp Source 02/14/23 1006 Oral     SpO2 02/14/23 1006 98 %     Weight --      Height --      Head Circumference --      Peak Flow --      Pain Score 02/14/23 1008 10     Pain Loc --      Pain Education --      Exclude from Growth Chart --    No data found.  Updated Vital  Signs BP 116/62 (BP Location: Right Arm)   Pulse 90   Temp 98.7 F (37.1 C) (Oral)   Resp 20   LMP 02/13/2011   SpO2 98%   Visual Acuity Right Eye Distance:   Left Eye Distance:   Bilateral Distance:    Right Eye Near:   Left Eye Near:    Bilateral Near:     Physical Exam Vitals reviewed.  Constitutional:      Appearance: Normal appearance.  HENT:     Head: Normocephalic and atraumatic.     Right Ear: External ear normal.     Left Ear: External ear normal.     Nose: Congestion and rhinorrhea present.  Eyes:     Extraocular Movements: Extraocular movements intact.     Conjunctiva/sclera: Conjunctivae normal.     Pupils: Pupils are equal, round, and reactive to light.  Cardiovascular:     Rate and Rhythm: Normal rate and regular rhythm.  Pulmonary:     Effort: Pulmonary effort is normal.     Breath sounds: Normal breath sounds.  Musculoskeletal:        General: Normal range of motion.     Cervical back: Normal range of motion and neck supple.  Neurological:     General: No focal deficit present.     Mental Status: She is alert and oriented to person, place, and time. Mental status is at baseline.  Psychiatric:        Mood and Affect: Mood normal.      UC Treatments / Results  Labs (all labs ordered are listed, but only abnormal results are displayed)  Labs Reviewed  SARS CORONAVIRUS 2 (TAT 6-24 HRS)  BASIC METABOLIC PANEL    EKG   Radiology DG Foot Complete Left  Result Date: 02/13/2023 Please see detailed radiograph report in office note.   Procedures Procedures (including critical care time)  Medications Ordered in UC Medications - No data to display  Initial Impression / Assessment and Plan / UC Course  I have reviewed the triage vital signs and the nursing notes.  Pertinent labs & imaging results that were available during my care of the patient were reviewed by me and considered in my medical decision making (see chart for details).     Covid Test pending.  Symptom treatment indicated.  BMP pending to check renal function.  Will cover with Paxlovid if positive.  Per discharge instructions.  ED precautions given.  Patient verbalized understanding and agreement with plan today. Final Clinical Impressions(s) / UC Diagnoses   Final diagnoses:  Encounter for screening for COVID-19  Exposure to COVID-19 virus  Type 2 diabetes mellitus with hyperglycemia, unspecified whether long term insulin use (HCC)  Viral illness     Discharge Instructions      Your COVID 19 results will be available in 24 hours. Negative results are immediately resulted to Mychart. Positive results will receive a follow-up call from our clinic. If symptoms are present, I recommend home quarantine until results are known.  Continue with symptoms treatment as discussed and per medication orders. I will send anti-viral medications once I receive a confirmatory positive results indicating an active COVID infection. Continue hydrate well with fluids. If fever develops take tylenol 650 mg every 4-6 hours as needed. Emergency department for any blood sugars >400 or meter read high. COVID can cause excessively elevated blood sugar readings.     ED Prescriptions     Medication Sig Dispense Auth. Provider   Guaifenesin [facility] MAXIMUM STRENGTH) 1200 MG TB12 Take 1 tablet (1,200 mg total) by mouth every 12 (twelve) hours as needed. 14 tablet Bing Neighbors, NP   benzonatate (TESSALON) 200 MG capsule Take 1 capsule (200 mg total) by mouth 3 (three) times daily as needed for cough. 20 capsule Bing Neighbors, NP      PDMP not reviewed this encounter.   Bing Neighbors, NP 02/14/23 1152

## 2023-02-15 ENCOUNTER — Telehealth: Payer: Self-pay

## 2023-02-15 LAB — BASIC METABOLIC PANEL
BUN/Creatinine Ratio: 28 (ref 12–28)
BUN: 45 mg/dL — ABNORMAL HIGH (ref 8–27)
CO2: 23 mmol/L (ref 20–29)
Calcium: 9.3 mg/dL (ref 8.7–10.3)
Chloride: 101 mmol/L (ref 96–106)
Creatinine, Ser: 1.63 mg/dL — ABNORMAL HIGH (ref 0.57–1.00)
Glucose: 208 mg/dL — ABNORMAL HIGH (ref 70–99)
Potassium: 4.9 mmol/L (ref 3.5–5.2)
Sodium: 136 mmol/L (ref 134–144)
eGFR: 35 mL/min/{1.73_m2} — ABNORMAL LOW (ref >59)

## 2023-02-15 MED ORDER — PAXLOVID (150/100) 10 X 150 MG & 10 X 100MG PO TBPK: 2.0000 | ORAL_TABLET | Freq: Two times a day (BID) | ORAL | 0 refills | Status: AC

## 2023-02-15 NOTE — Telephone Encounter (Signed)
Per K. Harris, NP, "Please send full dose of Paxlovid. discussed elevated glucose with patient yesterday and recommendation if sugars continue to be elevated. she will need to follow-up with her PCP in 4 weeks to have renal studies recheck. She has CKD-3 however her GFR has changed a bit within one year and see's an outside provider. Thanks "  Reviewed with patient, verified pharmacy, prescription sent

## 2023-02-23 MED ORDER — LISINOPRIL 2.5 MG PO TABS: ORAL_TABLET | ORAL | 0 refills | Status: DC

## 2023-02-23 NOTE — Telephone Encounter (Signed)
Pt's medication was sent to pt's pharmacy as requested. Confirmation received.  °

## 2023-02-23 NOTE — Addendum Note (Signed)
Addended by: Margaret Pyle D on: 02/23/2023 04:46 PM   Modules accepted: Orders

## 2023-02-24 ENCOUNTER — Telehealth: Payer: Self-pay | Admitting: Internal Medicine

## 2023-02-24 ENCOUNTER — Telehealth: Payer: 59 | Admitting: Internal Medicine

## 2023-02-24 VITALS — Ht 66.0 in

## 2023-02-24 DIAGNOSIS — E11319 Type 2 diabetes mellitus with unspecified diabetic retinopathy without macular edema: Secondary | ICD-10-CM

## 2023-02-24 DIAGNOSIS — Z7984 Long term (current) use of oral hypoglycemic drugs: Secondary | ICD-10-CM

## 2023-02-24 DIAGNOSIS — E1159 Type 2 diabetes mellitus with other circulatory complications: Secondary | ICD-10-CM | POA: Diagnosis not present

## 2023-02-24 DIAGNOSIS — E1142 Type 2 diabetes mellitus with diabetic polyneuropathy: Secondary | ICD-10-CM

## 2023-02-24 DIAGNOSIS — E1122 Type 2 diabetes mellitus with diabetic chronic kidney disease: Secondary | ICD-10-CM | POA: Diagnosis not present

## 2023-02-24 DIAGNOSIS — S98112A Complete traumatic amputation of left great toe, initial encounter: Secondary | ICD-10-CM

## 2023-02-24 DIAGNOSIS — N1831 Chronic kidney disease, stage 3a: Secondary | ICD-10-CM

## 2023-02-24 DIAGNOSIS — Z7985 Long-term (current) use of injectable non-insulin antidiabetic drugs: Secondary | ICD-10-CM | POA: Insufficient documentation

## 2023-02-24 DIAGNOSIS — Z794 Long term (current) use of insulin: Secondary | ICD-10-CM

## 2023-02-24 MED ORDER — TRESIBA FLEXTOUCH 100 UNIT/ML ~~LOC~~ SOPN: 44.0000 [IU] | PEN_INJECTOR | Freq: Every day | SUBCUTANEOUS | 3 refills | Status: DC

## 2023-02-24 MED ORDER — METFORMIN HCL 1000 MG PO TABS: 1000.0000 mg | ORAL_TABLET | Freq: Every day | ORAL | 3 refills | Status: DC

## 2023-02-24 MED ORDER — EMPAGLIFLOZIN 25 MG PO TABS: 25.0000 mg | ORAL_TABLET | Freq: Every day | ORAL | 3 refills | Status: DC

## 2023-02-24 MED ORDER — INSULIN LISPRO (1 UNIT DIAL) 100 UNIT/ML (KWIKPEN): PEN_INJECTOR | SUBCUTANEOUS | 3 refills | Status: DC

## 2023-02-24 MED ORDER — SEMAGLUTIDE (2 MG/DOSE) 8 MG/3ML ~~LOC~~ SOPN: 2.0000 mg | PEN_INJECTOR | SUBCUTANEOUS | 3 refills | Status: DC

## 2023-02-24 MED ORDER — INSULIN PEN NEEDLE 31G X 5 MM MISC: 1.0000 | Freq: Four times a day (QID) | 3 refills | Status: DC

## 2023-02-24 NOTE — Progress Notes (Signed)
Virtual Visit via Video Note  I connected with [name] on 02/24/23  at 8:10 AM  by a video enabled telemedicine application and verified that I am speaking with the correct person using two identifiers.   I discussed the limitations of evaluation and management by telemedicine and the availability of in person appointments. The patient expressed understanding and agreed to proceed.   -Location of the patient : -Location of the provider : Office -The names of all persons participating in the telemedicine service : Pt and myself         Name: [name]  Age/ Sex: 62 y.o., female   MRN/ DOB: [id number], 05/08/1961     PCP: Raymon Mutton., FNP   Reason for Endocrinology Evaluation: Type 2 Diabetes Mellitus  Initial Endocrine Consultative Visit: 10/07/2019    PATIENT IDENTIFIER: [name] is a 62 y.o. female with a past medical history of HTn and T2DM. The patient has followed with Endocrinology clinic since 10/07/2019 for consultative assistance with management of her diabetes.      DIABETIC HISTORY:  [name] was diagnosed with gestational diabetes in 1984, requiring insulin , she was off insulin until her next pregnancy in 1985 and has been on insulin ever since. Her hemoglobin A1c has ranged from 8.1% in 2016, peaking at 12.5% in 2020.   On her initial visit to our clinic, she had an A1c of 9.6%. She was on MDI regimen as well as victoza and metformin, which were continued and added jardiance.   Developed yeast infections to Jardiance 12/2019  Switched victoza to trulicity 12/2019  SUBJECTIVE:   During the last visit (08/19/2022): A1c 9.6 %    Today (02/24/2023): [name] is here for a follow up on diabetes management.  She checks her blood sugars multiple times daily through [facility] G7, we were unable to downloaded as the patient uses a receiver.. She has  no hypoglycemic episodes since her last visit.  She is s/p left big toe amputation  11/23/2022, she follows with podiatry.  She is healing appropriately  She continues to follow-up with cardiology for CAD  We had to switch Trulicity to Ozempic due to shortage of supply, she denies any nausea or vomiting Denies any constipation or diarrhea She was recently diagnosed and treated for COVID   HOME DIABETES REGIMEN:  Tresiba  40units daily Humalog 12 units with each meal Jardiance 25 mg daily  Metformin 1000 mg twice daily  Ozempic 1 mg weekly NW:GNFAOZH (BG-130/30)     Statin: yes ACE-I/ARB: yes     CONTINUOUS GLUCOSE MONITORING RECORD INTERPRETATION: Unable to download      Microvascular complications:  Neuropathy, retinopathy , s/p left great toe amputation 10/2022 Denies: CKD  Last eye exam: Completed 06/2019   Macrovascular complications:  CAD, CHF Denies:  PVD, CVA       HISTORY:  Past Medical History:  Past Medical History:  Diagnosis Date   Adhesive capsulitis of shoulder    bilateral, Steroid injection Dr. Nedra Hai 1/12 bilaterally   CAD (coronary artery disease)    nonobstructive. Last cardiac cath (2008) showing left circumflex with mid 50% stenosis and distal luminal irregularities. Also with RCA with mid to distal 30-40% stenosis. // Previously evaluated by [facility], never followed up outpatient.   CAP (community acquired pneumonia) 06/15/2012   05/2012 CXR: Mild opacification of the posterior lung base on the lateral film  as cannot exclude infection/atelectasis.     CHF (  congestive heart failure) (HCC)    Diabetes mellitus 2007   Type II, insulin dependent   Diabetic retinopathy    GERD (gastroesophageal reflux disease)    Glaucoma    Hyperlipidemia    Hypertension    Obesity    Peripheral neuropathy    Past Surgical History:  Past Surgical History:  Procedure Laterality Date   ANTERIOR CERVICAL DECOMP/DISCECTOMY FUSION N/A 06/16/2016   Procedure: Anterior Cervical Decompression/discectomy Fusion - Cervical six - Cervical  seven;  Surgeon: Tia Alert, MD;  Location: [facility] OR;  Service: Neurosurgery;  Laterality: N/A;  Anterior Cervical Decompression/discectomy Fusion - Cervical six - Cervical seven   APPENDECTOMY     CARDIAC CATHETERIZATION     CATARACT EXTRACTION     GLAUCOMA SURGERY     LEFT HEART CATH AND CORONARY ANGIOGRAPHY N/A 05/22/2018   Procedure: LEFT HEART CATH AND CORONARY ANGIOGRAPHY;  Surgeon: Lennette Bihari, MD;  Location: [facility] INVASIVE CV LAB;  Service: Cardiovascular;  Laterality: N/A;   LEFT HEART CATHETERIZATION WITH CORONARY ANGIOGRAM N/A 01/24/2012   Procedure: LEFT HEART CATHETERIZATION WITH CORONARY ANGIOGRAM;  Surgeon: Tonny Bollman, MD;  Location: [facility] CATH LAB;  Service: Cardiovascular;  Laterality: N/A;   POSTERIOR CERVICAL FUSION/FORAMINOTOMY N/A 10/22/2015   Procedure: Cervical three-Cervical Seven Posterior cervical fusion with lateral mass fixation,  Laminectomy Cervical Three-Cervical Seven;  Surgeon: Tia Alert, MD;  Location: [facility] NEURO ORS;  Service: Neurosurgery;  Laterality: N/A;  Posterior   TUBAL LIGATION     Social History:  reports that she quit smoking about 16 years ago. Her smoking use included cigarettes. She started smoking about 23 years ago. She has a 2.1 pack-year smoking history. She has never used smokeless tobacco. She reports that she does not drink alcohol and does not use drugs. Family History:  Family History  Problem Relation Age of Onset   Heart disease Mother    Hypertension Sister    Hypertension Sister    Hypertension Sister    Heart attack Neg Hx    Stroke Neg Hx      HOME MEDICATIONS: Allergies as of 02/24/2023   No Known Allergies      Medication List        Accurate as of February 24, 2023  7:00 AM. If you have any questions, ask your nurse or doctor.          amitriptyline 25 MG tablet Commonly known as: ELAVIL Take 1 tablet (25 mg total) by mouth at bedtime.   ammonium lactate 12 % cream Commonly known as: AMLACTIN Apply 1  Application topically as needed for dry skin.   azelastine 0.1 % nasal spray Commonly known as: ASTELIN azelastine 137 mcg (0.1 %) nasal spray aerosol   B-D SINGLE USE SWABS REGULAR Pads Use as directed   benzonatate 200 MG capsule Commonly known as: TESSALON Take 1 capsule (200 mg total) by mouth 3 (three) times daily as needed for cough.   clopidogrel 75 MG tablet Commonly known as: PLAVIX Take 1 tablet (75 mg total) by mouth daily.   Combigan 0.2-0.5 % ophthalmic solution Generic drug: brimonidine-timolol Place 1 drop into both eyes 2 (two) times daily.   Dexcom G6 Transmitter Misc 1 Device by Does not apply route as directed.   Dexcom G7 Receiver Devi Use to check blood sugars   diphenhydrAMINE 25 MG tablet Commonly known as: BENADRYL Take 1 tablet (25 mg total) by mouth every 6 (six) hours as needed for itching or allergies.  dorzolamide 2 % ophthalmic solution Commonly known as: TRUSOPT Place 1 drop into both eyes 2 (two) times daily.   doxycycline 100 MG tablet Commonly known as: VIBRA-TABS Take 1 tablet (100 mg total) by mouth 2 (two) times daily.   empagliflozin 25 MG Tabs tablet Commonly known as: Jardiance Take 1 tablet (25 mg total) by mouth daily.   fluticasone 50 MCG/ACT nasal spray Commonly known as: Flonase Place 1 spray into both nostrils daily.   furosemide 20 MG tablet Commonly known as: LASIX Take 20 mg by mouth 2 (two) times daily.   gabapentin 300 MG capsule Commonly known as: NEURONTIN Take 300 mg by mouth 2 (two) times daily.   glucose blood test strip Commonly known as: OneTouch Verio USE TO CHECK BLOOD SUGAR 4 TIMES DAILY AS DIRECTED   GNP [place] Test Strips test strip Generic drug: glucose blood Use as instructed   True Metrix Blood Glucose Test test strip Generic drug: glucose blood Use as instructed   GNP True Metrix Air Meter w/Device Kit 1 kit by Does not apply route 4 (four) times daily. E11.42   Guaifenesin  1200 MG Tb12 Commonly known as: Mucinex Maximum Strength Take 1 tablet (1,200 mg total) by mouth every 12 (twelve) hours as needed.   HYDROcodone-acetaminophen 5-325 MG tablet Commonly known as: NORCO/VICODIN Take 1-2 tablets by mouth every 6 (six) hours as needed.   insulin lispro 100 UNIT/ML KwikPen Commonly known as: HumaLOG KwikPen Max daily 60 units   Insulin Pen Needle 31G X 5 MM Misc 1 Device by Does not apply route in the morning, at noon, in the evening, and at bedtime.   isosorbide mononitrate 60 MG 24 hr tablet Commonly known as: IMDUR TAKE 1 TABLET EVERY DAY   lisinopril 2.5 MG tablet Commonly known as: ZESTRIL TAKE 1 TABLET(2.5 MG) BY MOUTH DAILY   metFORMIN 1000 MG tablet Commonly known as: GLUCOPHAGE Take 1 tablet (1,000 mg total) by mouth 2 (two) times daily with a meal.   metoprolol tartrate 100 MG tablet Commonly known as: LOPRESSOR Take 1 tablet (100 mg total) by mouth 2 (two) times daily.   montelukast 10 MG tablet Commonly known as: SINGULAIR montelukast 10 mg tablet   mupirocin ointment 2 % Commonly known as: BACTROBAN Apply 1 Application topically 2 (two) times daily.   nitroGLYCERIN 0.4 MG SL tablet Commonly known as: NITROSTAT DISSOLVE 1 TABLET (0.4 MG TOTAL) UNDER THE TONGUE EVERY 5 MINUTES AS NEEDED FOR CHEST PAIN.   OneTouch Delica Lancets Fine Misc Check blood sugar as instructed up to 3 times a day   TRUEplus Lancets 33G Misc Check 4 times a daily   pantoprazole 40 MG tablet Commonly known as: PROTONIX Take 1 tablet (40 mg total) by mouth daily.   promethazine 25 MG tablet Commonly known as: PHENERGAN Take 1 tablet (25 mg total) by mouth every 8 (eight) hours as needed for nausea or vomiting.   ranolazine 500 MG 12 hr tablet Commonly known as: Ranexa Take 1 tablet (500 mg total) by mouth 2 (two) times daily.   Rocklatan 0.02-0.005 % Soln Generic drug: Netarsudil-Latanoprost Rocklatan 0.02 %-0.005 % eye drops   rosuvastatin  40 MG tablet Commonly known as: CRESTOR TAKE 1 TABLET(40 MG) BY MOUTH DAILY   Santyl 250 UNIT/GM ointment Generic drug: collagenase Apply 1 application topically daily as needed (foot).   Semaglutide (1 MG/DOSE) 4 MG/3ML Sopn Inject 1 mg as directed once a week.   Evaristo Bury FlexTouch 100 UNIT/ML FlexTouch Pen Generic  drug: insulin degludec Inject 40 Units into the skin daily.   True Metrix Level 1 Low Soln Use as directed         OBJECTIVE:   Vital Signs: LMP 02/13/2011   Wt Readings from Last 3 Encounters:  08/19/22 239 lb (108.4 kg)  05/11/22 234 lb 9.6 oz (106.4 kg)  02/15/22 237 lb (107.5 kg)     Exam: General: Pt appears well and is in NAD  Neuro: MS is good with appropriate affect, pt is alert and Ox3      DM foot exam: per podiatry 01/26/2023    DATA REVIEWED:  Lab Results  Component Value Date   HGBA1C 9.6 (A) 08/19/2022   HGBA1C 10.0 (A) 02/15/2022   HGBA1C 11.3 (A) 10/04/2021    Latest Reference Range & Units 02/14/23 11:03  Sodium 134 - 144 mmol/L 136  Potassium 3.5 - 5.2 mmol/L 4.9  Chloride 96 - 106 mmol/L 101  CO2 20 - 29 mmol/L 23  Glucose 70 - 99 mg/dL 295 (H)  BUN 8 - 27 mg/dL 45 (H)  Creatinine 6.21 - 1.00 mg/dL 3.08 (H)  Calcium 8.7 - 10.3 mg/dL 9.3  BUN/Creatinine Ratio 12 - 28  28  eGFR >59 mL/min/1.73 35 (L)  (H): Data is abnormally high (L): Data is abnormally low   ASSESSMENT / PLAN / RECOMMENDATIONS:   1) Type 2 Diabetes Mellitus, Poorly controlled, With retinopathic,  neuropathic, and macrovascular  complications complications - Most recent A1c of 9.6%. Goal A1c < 7.0 %.    -This was a virtual visit and we were unable to obtain an updated A1c nor download Dexcom G7 as she uses the receiver rather than the phone -Her fasting BG this morning 262 Mg/DL, I question imperfect adherence to insulin intake -I again emphasized the importance of taking [place] daily regardless of her eating status -I emphasized the importance of  taking Humalog before each meal and using a correction scale as needed -We had to switch Trulicity to Ozempic due to shortage of supplies, she is not having any side effects, will increase the dose as below -I will also increase basal insulin -I will decrease her metformin by 50% as her most recent GFR is 35, will determine long-term plan after repeat BMP  MEDICATIONS: - Increase Tresiba to 44 units daily  - Continue Humalog 12 units with each meal  - Continue  Jardiance  25 mg daily - Decrease Metformin 1000 mg ONE tablet  daily  - Increase Ozempic 2 mg ONCE WEEKLY - CF : Humalog  (BG -130/30) TIDQAC   EDUCATION / INSTRUCTIONS: BG monitoring instructions: Patient is instructed to check her blood sugars 4 times a day, before meals and bedtime  Call [facility] if: BG persistently < 70  I reviewed the Rule of 15 for the treatment of hypoglycemia in detail with the patient. Literature supplied.    2) Diabetic complications:  Eye: Does have known diabetic retinopathy.  Neuro/ Feet: Does have known diabetic peripheral neuropathy .  Renal: Patient does not have known baseline CKD. She   is on an ACEI/ARB at present.      F/U in 3 months    Signed electronically by: Lyndle Herrlich, MD  [facility]  [name] [address] [place] [place], [place] [address] Phone: [phone] FAX: [phone]   CC: Raymon Mutton., FNP [address] [place] [address] Phone: [phone]  Fax: [phone]  Return to Endocrinology clinic as  below: Future Appointments  Date Time Provider Department Center  02/24/2023  8:10 AM Jahron Hunsinger, Konrad Dolores, MD [facility] None  02/28/2023  9:15 AM Vivi Barrack, DPM [facility]

## 2023-02-24 NOTE — Telephone Encounter (Signed)
 Patient aware and will make medication changes.

## 2023-02-24 NOTE — Telephone Encounter (Signed)
Please contact the patient and schedule her or put on the wait list for follow-up with me in 3 months    Thanks

## 2023-02-24 NOTE — Patient Instructions (Signed)
-   Increase  Tresiba to 44 units daily  - Continue  Jardiance  25 mg daily - Decrease Metformin 1000 mg  to one tablet daily  - Increase Ozempic 2 mg ONCE WEEKLY  -Novolog correctional insulin:  Use the scale below to help guide you BEFORE each meal   Blood sugar before meal Number of units to inject  Less than 160 12 unit  161 -  190 13 units  191 -  220 14 units  241 -  250 15 units  251 -  280 16 units  281 -  310 17 units  311 -  340 18 units  341 -  370 19 units  371 -  400 20  units  401 - 430 21 units      HOW TO TREAT LOW BLOOD SUGARS (Blood sugar LESS THAN 70 MG/DL) Please follow the RULE OF 15 for the treatment of hypoglycemia treatment (when your (blood sugars are less than 70 mg/dL)   STEP 1: Take 15 grams of carbohydrates when your blood sugar is low, which includes:  3-4 GLUCOSE TABS  OR 3-4 OZ OF JUICE OR REGULAR SODA OR ONE TUBE OF GLUCOSE GEL    STEP 2: RECHECK blood sugar in 15 MINUTES STEP 3: If your blood sugar is still low at the 15 minute recheck --> then, go back to STEP 1 and treat AGAIN with another 15 grams of carbohydrates

## 2023-02-24 NOTE — Telephone Encounter (Signed)
I forgot to tell the patient to reduce her metformin from 2 tablets daily to 1 tablet daily because her for kidney function was low, I will check her levels on the next visit and determine what to do with the metformin but for now she needs to cut it down to 1 tablet a day   I also told her to increase her Tresiba to 44 units a day   Thanks

## 2023-02-28 ENCOUNTER — Ambulatory Visit (INDEPENDENT_AMBULATORY_CARE_PROVIDER_SITE_OTHER): Payer: 59

## 2023-02-28 ENCOUNTER — Ambulatory Visit (INDEPENDENT_AMBULATORY_CARE_PROVIDER_SITE_OTHER): Payer: 59 | Admitting: Podiatry

## 2023-02-28 DIAGNOSIS — L97522 Non-pressure chronic ulcer of other part of left foot with fat layer exposed: Secondary | ICD-10-CM

## 2023-02-28 DIAGNOSIS — M869 Osteomyelitis, unspecified: Secondary | ICD-10-CM

## 2023-02-28 MED ORDER — CEPHALEXIN 500 MG PO CAPS: 500.0000 mg | ORAL_CAPSULE | Freq: Two times a day (BID) | ORAL | 0 refills | Status: DC

## 2023-02-28 MED ORDER — SILVER SULFADIAZINE 1 % EX CREA: 1.0000 | TOPICAL_CREAM | Freq: Every day | CUTANEOUS | 0 refills | Status: DC

## 2023-02-28 NOTE — Progress Notes (Signed)
Subjective: Chief Complaint  Patient presents with   Foot Ulcer    Pt came in today for a toe amputation foolow up pt stated that she is doing better with that toe but the third toe on the same foot is giving her problems.   62 year old female presents as stated with the above concerns.  She states that the left third toe has started to cause some issues and she had a callus, wound on this toe.  She denies any drainage or pus.  No fevers or chills.  No increase in swelling.  She states she noticed this about a week ago.  Last A1c was 9.6 on August 19, 2022.  Objective: AAO x3, NAD DP/PT pulses palpable bilaterally, CRT less than 3 seconds Hammertoe deformity noted of the right third toe as well as the fourth and fifth toes however the distal aspect of the third toe with hyperkeratotic lesion with central granular wound noted with any probe to bone, and or tunneling.  There is no significant cellulitis present but there is some localized edema distally.  There is no fluctuation or crepitation.  There is no malodor.  No ascending cellulitis. No pain with calf compression, swelling, warmth, erythema  Assessment: 62 year old female with ulceration left third toe, hammertoe  Plan: -All treatment options discussed with the patient including all alternatives, risks, complications.  -X-rays obtained and reviewed.  3 views of the foot were obtained.  No definitive evidence of acute fracture.  There is no cortical changes suggest osteomyelitis unable to identify today although it is limited given the deformity of the digit. -Sharply debrided the hyperkeratotic lesion revealed underlying ulceration.  Recommend Silvadene dressing changes daily which I prescribed.  Unfortunately she is still at risk of amputation of toe which we discussed today.  An attempt to salvage the toe will likely proceed with flexor tenotomy next week. -Blood work ordered including CBC, sed rate, CRP, BMP. -Uncontrolled diabetes  with increased A1c-6 glucose control to help facilitate healing. -Previous ABI on 09/13/2021 WNL- she has healed the other amputation sites on the same foot well. -Patient encouraged to call the office with any questions, concerns, change in symptoms.   Vivi Barrack DPM

## 2023-03-08 ENCOUNTER — Telehealth: Payer: Self-pay | Admitting: Podiatry

## 2023-03-08 ENCOUNTER — Ambulatory Visit: Payer: 59 | Admitting: Podiatry

## 2023-03-08 NOTE — Telephone Encounter (Signed)
Pt called and was scheduled for a procedure today at 115pm but is not able to make it, When did you want me to reschedule it for?

## 2023-03-09 ENCOUNTER — Ambulatory Visit: Payer: 59 | Admitting: Podiatry

## 2023-03-13 ENCOUNTER — Other Ambulatory Visit: Payer: Self-pay | Admitting: Podiatry

## 2023-03-13 ENCOUNTER — Ambulatory Visit (INDEPENDENT_AMBULATORY_CARE_PROVIDER_SITE_OTHER): Payer: 59

## 2023-03-13 ENCOUNTER — Ambulatory Visit (INDEPENDENT_AMBULATORY_CARE_PROVIDER_SITE_OTHER): Payer: 59 | Admitting: Podiatry

## 2023-03-13 ENCOUNTER — Other Ambulatory Visit: Payer: Self-pay

## 2023-03-13 DIAGNOSIS — M869 Osteomyelitis, unspecified: Secondary | ICD-10-CM

## 2023-03-13 DIAGNOSIS — M2042 Other hammer toe(s) (acquired), left foot: Secondary | ICD-10-CM

## 2023-03-13 MED ORDER — PREGABALIN 75 MG PO CAPS: 75.0000 mg | ORAL_CAPSULE | Freq: Two times a day (BID) | ORAL | 0 refills | Status: DC

## 2023-03-13 NOTE — Patient Instructions (Addendum)
Tomorrow remove the bandage he can apply a small amount of antibiotic ointment and a bandage.  Wash normally.  Remain in surgical shoe. Monitor for any clinical signs or symptoms of infection and directed to call the office immediately should any occur or go to the ER.  ---   This week go down to taking gabapentin twice daily the next week once a day and then then once you wean off the gabapentin has been start Lyrica.  -  Pregabalin Capsules What is this medication? PREGABALIN (pre GAB a lin) treats nerve pain. It may also be used to prevent and control seizures in people with epilepsy. It works by calming overactive nerves in your body. This medicine may be used for other purposes; ask your health care provider or pharmacist if you have questions. COMMON BRAND NAME(S): Lyrica What should I tell my care team before I take this medication? They need to know if you have any of these conditions: Heart failure Kidney disease Lung disease Substance use disorder Suicidal thoughts, plans or attempt by you or a family member An unusual or allergic reaction to pregabalin, other medications, foods, dyes, or preservatives Pregnant or trying to get pregnant Breast-feeding How should I use this medication? Take this medication by mouth with water. Take it as directed on the prescription label at the same time every day. You can take it with or without food. If it upsets your stomach, take it with food. Keep taking it unless your care team tells you to stop. A special MedGuide will be given to you by the pharmacist with each prescription and refill. Be sure to read this information carefully each time. Talk to your care team about the use of this medication in children. While it may be prescribed for children as young as 1 month for selected conditions, precautions do apply. Overdosage: If you think you have taken too much of this medicine contact a poison control center or emergency room at  once. NOTE: This medicine is only for you. Do not share this medicine with others. What if I miss a dose? If you miss a dose, take it as soon as you can. If it is almost time for your next dose, take only that dose. Do not take double or extra doses. What may interact with this medication? This medication may interact with the following: Alcohol Antihistamines for allergy, cough, and cold Certain medications for anxiety or sleep Certain medications for blood pressure, heart disease Certain medications for depression like amitriptyline, fluoxetine, sertraline Certain medications for diabetes, like pioglitazone, rosiglitazone Certain medications for seizures like phenobarbital, primidone General anesthetics like halothane, isoflurane, methoxyflurane, propofol Medications that relax muscles for surgery Opioid medications for pain Phenothiazines like chlorpromazine, mesoridazine, prochlorperazine, thioridazine This list may not describe all possible interactions. Give your health care provider a list of all the medicines, herbs, non-prescription drugs, or dietary supplements you use. Also tell them if you smoke, drink alcohol, or use illegal drugs. Some items may interact with your medicine. What should I watch for while using this medication? Visit your care team for regular checks on your progress. Tell your care team if your symptoms do not start to get better or if they get worse. Do not suddenly stop taking this medication. You may develop a severe reaction. Your care team will tell you how much medication to take. If your care team wants you to stop the medication, the dose may be slowly lowered over time to avoid any side effects.  This medication may affect your coordination, reaction time, or judgment. Do not drive or operate machinery until you know how this medication affects you. Sit up or stand slowly to reduce the risk of dizzy or fainting spells. Drinking alcohol with this medication  can increase the risk of these side effects. If you or your family notice any changes in your behavior, such as new or worsening depression, thoughts of harming yourself, anxiety, other unusual or disturbing thoughts, or memory loss, call your care team right away. Wear a medical ID bracelet or chain if you are taking this medication for seizures. Carry a card that describes your condition. List the medications and doses you take on the card. This medication may make it more difficult to father a child. Talk to your care team if you are concerned about your fertility. What side effects may I notice from receiving this medication? Side effects that you should report to your care team as soon as possible: Allergic reactions or angioedema--skin rash, itching, hives, swelling of the face, eyes, lips, tongue, arms, or legs, trouble swallowing or breathing Blurry vision Thoughts of suicide or self-harm, worsening mood, feelings of depression Trouble breathing Side effects that usually do not require medical attention (report to your care team if they continue or are bothersome): Dizziness Drowsiness Dry mouth Nausea Swelling of the ankles, feet, hands Vomiting Weight gain This list may not describe all possible side effects. Call your doctor for medical advice about side effects. You may report side effects to FDA at 1-800-FDA-1088. Where should I keep my medication? Keep out of the reach of children and pets. This medication can be abused. Keep it in a safe place to protect it from theft. Do not share it with anyone. It is only for you. Selling or giving away this medication is dangerous and against the law. Store at [employer] [facility] (77 degrees F). Get rid of any unused medication after the expiration date. This medication may cause harm and death if it is taken by other adults, children, or pets. It is important to get rid of the medication as soon as you no longer need it, or it is expired. You can  do this in two ways: Take the medication to a medication take-back program. Check with your pharmacy or law enforcement to find a location. If you cannot return the medication, check the label or package insert to see if the medication should be thrown out in the garbage or flushed down the toilet. If you are not sure, ask your care team. If it is safe to put it in the trash, take the medication out of the container. Mix the medication with cat litter, dirt, coffee grounds, or other unwanted substance. Seal the mixture in a bag or container. Put it in the trash. NOTE: This sheet is a summary. It may not cover all possible information. If you have questions about this medicine, talk to your doctor, pharmacist, or health care provider.  2024 Elsevier/Gold Standard (2021-03-16 00:00:00)

## 2023-03-13 NOTE — Progress Notes (Signed)
Subjective: Chief Complaint  Patient presents with   Osteomyelitis of toe of left foot     62 year old female presents as stated with the above concerns.  Patient presents today for flexor tenotomy of the left third toe given ongoing ulceration.  She states that she has noticeable bit of bloody drainage but no pus.  She is not noticing increasing swelling or redness.  She is also concerned about her neuropathy and she feels the gabapentin is not helping more she is asked not switching to another medication, Lyrica.  She states that she is on research and wanted to try this other medication.  No fevers or chills.  No other concerns.   Last A1c was 9.6 on August 19, 2022.  Objective: AAO x3, NAD DP/PT pulses palpable bilaterally, CRT less than 3 seconds Hammertoe deformity noted of the right third toe as well as the fourth and fifth toes however the distal aspect of the third toe with hyperkeratotic lesion with central granular wound noted with any probe to bone, and or tunneling.  Wound appears about the same.  There is no drainage or pus.  There is no significant erythema, ascending cellulitis.  No fluctuation or crepitation.  No malodor.   No pain with calf compression, swelling, warmth, erythema  Assessment: 62 year old female with ulceration left third toe, hammertoe; neuropathy  Plan: -All treatment options discussed with the patient including all alternatives, risks, complications.  -Sharply debrided hyperkeratotic lesion at the distal aspect left third toe to reveal a central granular ulceration.  Unfortunately she is at high risk of amputation of the toe given ongoing ulceration as well as uncontrolled diabetes.  In order to try to help save the toe we discussed a flexor tenotomy to help decrease pressure.  We discussed risks of the procedure including infection, amputation, transfer lesions, reoccurrence as well as other risks of the procedure.  She agrees and consent was signed.  I  cleaned the skin with alcohol and mixture of 3 mL of lidocaine, Marcaine plain was infiltrated a digital block fashion.  Once anesthetized I prepped the toe with Betadine.  I then introduced an 18-gauge needle to the plantar aspect of the toe in order to transect the flexor tendon.  At this time the toe was sitting in a more rectus position.  Did have some residual contracture at the PIP and not fully reducible but it did seem to help get the pressure off the distal portion of the toe.  I irrigated with alcohol.  Small amount of antibiotic ointment was applied followed by dressing.  Discussed with her she can change the dressing tomorrow with a small amount of antibiotic ointment.  Toe crest dispensed having her stop wearing.  Med surgical shoe.  X-rays obtained and reviewed.  3 views of the foot were obtained.  Difficult to fully evaluate the toe however no obvious cortical destruction stress osteomyelitis time. No soft tissue emphysema.  Return in about 10 days (around 03/23/2023) for toe ulcer, x-ray.  Vivi Barrack DPM

## 2023-03-24 ENCOUNTER — Other Ambulatory Visit: Payer: Self-pay

## 2023-03-24 MED ORDER — DEXCOM G7 SENSOR MISC: 3 refills | Status: DC

## 2023-03-27 ENCOUNTER — Ambulatory Visit (INDEPENDENT_AMBULATORY_CARE_PROVIDER_SITE_OTHER): Payer: 59

## 2023-03-27 ENCOUNTER — Ambulatory Visit (INDEPENDENT_AMBULATORY_CARE_PROVIDER_SITE_OTHER): Payer: 59 | Admitting: Podiatry

## 2023-03-27 DIAGNOSIS — M869 Osteomyelitis, unspecified: Secondary | ICD-10-CM

## 2023-03-27 MED ORDER — DOXYCYCLINE HYCLATE 100 MG PO TABS: 100.0000 mg | ORAL_TABLET | Freq: Two times a day (BID) | ORAL | 0 refills | Status: DC

## 2023-03-27 NOTE — Progress Notes (Signed)
Subjective: Chief Complaint  Patient presents with   Wound Check     62 year old female presents as stated with the above concerns.  States that she is not see any drainage or pus.  She has no new concerns today.  No fevers or chills.  She has continued with daily dressing changes.    Last A1c was 9.6 on August 19, 2022.  Objective: AAO x3, NAD DP/PT pulses palpable bilaterally, CRT less than 3 seconds Hammertoe deformity noted of the right third toe as well as the fourth and fifth toes however the distal aspect of the third toe with hyperkeratotic lesion with central granular wound noted with any probe to bone, and or tunneling.  Wound appears to be stable about the same.  There is no fluctuation or crepitation but was noted.  Minimal edema to the distal aspect of the toe.   No pain with calf compression, swelling, warmth, erythema  Assessment: 62 year old female with ulceration left third toe, hammertoe; neuropathy  Plan: -All treatment options discussed with the patient including all alternatives, risks, complications.  -Repeat x-rays obtained reviewed.  3 views of the foot were obtained.  Partial rotations of the first, second toes.  There is cortical changes of the distal phalanx consistent with osteomyelitis of the third digit. -Discussed x-ray findings as well as ongoing symptoms I recommended partial rotation of the toe and she agrees with this and wishes to proceed. -The incision placement as well as the postoperative course was discussed with the patient. I discussed risks of the surgery which include, but not limited to, infection, bleeding, pain, swelling, need for further surgery, delayed or nonhealing, painful or ugly scar, numbness or sensation changes, over/under correction, recurrence, transfer lesions, further deformity, DVT/PE, loss of toe/foot. Patient understands these risks and wishes to proceed with surgery. The surgical consent was reviewed with the patient all 3  pages were signed. No promises or guarantees were given to the outcome of the procedure. All questions were answered to the best of my ability. Before the surgery the patient was encouraged to call the office if there is any further questions. The surgery will be performed at the [facility] on an outpatient basis.  Vivi Barrack DPM

## 2023-03-27 NOTE — Patient Instructions (Signed)
Pre-Operative Instructions  Congratulations, you have decided to take an important step to improving your quality of life.  You can be assured that the doctors of [facility] will be with you every step of the way.  Plan to be at the surgery center/hospital at least 1 (one) hour prior to your scheduled time unless otherwise directed by the surgical center/hospital staff.  You must have a responsible adult accompany you, remain during the surgery and drive you home.  Make sure you have directions to the surgical center/hospital and know how to get there on time. For hospital based surgery you will need to obtain a history and physical form from your family physician within 1 month prior to the date of surgery- we will give you a form for you primary physician.  We make every effort to accommodate the date you request for surgery.  There are however, times where surgery dates or times have to be moved.  We will contact you as soon as possible if a change in schedule is required.   No Aspirin/Ibuprofen for one week before surgery.  If you are on aspirin, any non-steroidal anti-inflammatory medications (Mobic, Aleve, Ibuprofen) you should stop taking it 7 days prior to your surgery.  You make take Tylenol  For pain prior to surgery.  Medications- If you are taking daily heart and blood pressure medications, seizure, reflux, allergy, asthma, anxiety, pain or diabetes medications, make sure the surgery center/hospital is aware before the day of surgery so they may notify you which medications to take or avoid the day of surgery. No food or drink after midnight the night before surgery unless directed otherwise by surgical center/hospital staff. No alcoholic beverages 24 hours prior to surgery.  No smoking 24 hours prior to or 24 hours after surgery. Wear loose pants or shorts- loose enough to fit over bandages, boots, and casts. No slip on shoes, sneakers are best. Bring your boot with you to the  surgery center/hospital.  Also bring crutches or a walker if your physician has prescribed it for you.  If you do not have this equipment, it will be provided for you after surgery. If you have not been contracted by the surgery center/hospital by the day before your surgery, call to confirm the date and time of your surgery. Leave-time from work may vary depending on the type of surgery you have.  Appropriate arrangements should be made prior to surgery with your employer. Prescriptions will be provided immediately following surgery by your doctor.  Have these filled as soon as possible after surgery and take the medication as directed. Remove nail polish on the operative foot. Wash the night before surgery.  The night before surgery wash the foot and leg well with the antibacterial soap provided and water paying special attention to beneath the toenails and in between the toes.  Rinse thoroughly with water and dry well with a towel.  Perform this wash unless told not to do so by your physician.  Enclosed: 1 Ice pack (please put in freezer the night before surgery)   1 Hibiclens skin cleaner   Pre-op Instructions  If you have any questions regarding the instructions, do not hesitate to call our office at any point during this process.   [place]: [address] [place], [place] [address] [phone]  [place]: [address] [place], [place] [address] [phone]  Dr. Matthew Wagoner, DPM  

## 2023-03-28 ENCOUNTER — Telehealth: Payer: Self-pay | Admitting: Podiatry

## 2023-03-28 HISTORY — PX: AMPUTATION TOE: SHX6595

## 2023-03-28 NOTE — Telephone Encounter (Signed)
You are not required to submit a notification/prior authorization based on the information provided. If you have general questions about the prior authorization requirements, visit [email or web address] > Clinician Resources > Advance and Admission Notification Requirements. The number above acknowledges your notification. Please write this reference number down for future reference. If you would like to request an organization determination, please call us at [phone]. Decision ID #: [id number]  Ovid Curd Tax ID number [id number] Address [address] [place], [place], [place] [address] Status In network Service details  Place [facility]  Name [facility] SUR[facility] Tax ID number [id number] Address [address] [place], [place] [address] [phone] Status In network  Start date 03/29/2023 End date 06/27/2023 Service description  Code pointer Primary Diagnosis code M86.9 Description Osteomyelitis, unspecified Procedure code details  Primary procedure code 28825 Description Amputation, toe; interphalangeal joint  The provider who is providing the service being requested.  Servicing provider name Nadara Mustard Tax ID number [id number] Address [address] [place], [place], [place] [address]  T. [phone]

## 2023-03-29 ENCOUNTER — Other Ambulatory Visit: Payer: Self-pay | Admitting: Podiatry

## 2023-03-29 DIAGNOSIS — M86172 Other acute osteomyelitis, left ankle and foot: Secondary | ICD-10-CM | POA: Diagnosis not present

## 2023-03-29 MED ORDER — HYDROCODONE-ACETAMINOPHEN 5-325 MG PO TABS: 1.0000 | ORAL_TABLET | Freq: Four times a day (QID) | ORAL | 0 refills | Status: DC | PRN

## 2023-03-29 MED ORDER — DOXYCYCLINE HYCLATE 100 MG PO TABS: 100.0000 mg | ORAL_TABLET | Freq: Two times a day (BID) | ORAL | 0 refills | Status: DC

## 2023-03-29 MED ORDER — PROMETHAZINE HCL 25 MG PO TABS: 25.0000 mg | ORAL_TABLET | Freq: Three times a day (TID) | ORAL | 0 refills | Status: DC | PRN

## 2023-03-29 NOTE — Progress Notes (Signed)
Postop medications sent 

## 2023-04-03 ENCOUNTER — Encounter: Payer: Self-pay | Admitting: Podiatry

## 2023-04-03 ENCOUNTER — Ambulatory Visit (INDEPENDENT_AMBULATORY_CARE_PROVIDER_SITE_OTHER): Payer: Medicare Other | Admitting: Podiatry

## 2023-04-03 ENCOUNTER — Ambulatory Visit (INDEPENDENT_AMBULATORY_CARE_PROVIDER_SITE_OTHER): Payer: Medicare Other

## 2023-04-03 VITALS — Ht 66.0 in | Wt 239.0 lb

## 2023-04-03 DIAGNOSIS — Z9889 Other specified postprocedural states: Secondary | ICD-10-CM

## 2023-04-03 DIAGNOSIS — M2042 Other hammer toe(s) (acquired), left foot: Secondary | ICD-10-CM

## 2023-04-03 DIAGNOSIS — M86172 Other acute osteomyelitis, left ankle and foot: Secondary | ICD-10-CM

## 2023-04-03 NOTE — Progress Notes (Signed)
Subjective: Chief Complaint  Patient presents with   Routine Post Op    RM14: POV # 1 DOS 03/29/23 --PARTIAL AMPUTATION LEFT THIRD TOE    62 year old female presents with above concerns.  She states that she is doing well and denies any significant pain.  Does not report any fevers or chills.  She has no new concerns.   Objective: AAO x3, NAD-wearing surgical shoe DP/PT pulses palpable bilaterally, CRT less than 3 seconds Incisions well coapted with sutures intact.  There is mild edema.  There is no erythema or warmth.  No drainage or pus.  No evidence of dehiscence or signs of infection today.  No open lesions or new ulcerations or preulcerative lesions noted. No pain with calf compression, swelling, warmth, erythema  Assessment: Status post left partial third toe amputation, healing well  Plan: -All treatment options discussed with the patient including all alternatives, risks, complications.  -X-rays obtained reviewed.  3 views of the foot were taken.  No evidence of acute fracture.  S/p partial toe amputations with well-corticated margins. -Clean the incision.  Xeroform was applied followed by dressing.  Discussed that she can change the dressing with a small amount of antibiotic ointment and a bandage every couple of days.  Continue elevation limited weightbearing in surgical shoe at all times. -Monitor for any clinical signs or symptoms of infection and directed to call the office immediately should any occur or go to the ER. -Patient encouraged to call the office with any questions, concerns, change in symptoms.   Vivi Barrack DPM

## 2023-04-13 ENCOUNTER — Ambulatory Visit: Payer: Medicare Other | Admitting: Podiatry

## 2023-04-13 ENCOUNTER — Encounter: Payer: Self-pay | Admitting: Podiatry

## 2023-04-13 DIAGNOSIS — M86172 Other acute osteomyelitis, left ankle and foot: Secondary | ICD-10-CM

## 2023-04-13 NOTE — Progress Notes (Signed)
Subjective: Chief Complaint  Patient presents with   Routine Post Op    PATIENT STATES THAT HER FOOT IS DOING OK     62 year old female presents with above concerns.  She presents today with possible suture removal.  She says that she is doing well.  She denies any drainage and she is up and dressing intact.  Does not report any fevers or chills.  She is doing a surgical shoe.     Objective: AAO x3, NAD-wearing surgical shoe DP/PT pulses palpable bilaterally, CRT less than 3 seconds Incisions well coapted with sutures intact.  Some minimal motion still across the incision on the sides.  There is some edema present but there is no erythema, drainage or pus.  There is no ascending cellulitis.  No fluctuation or crepitation there is no malodor. No pain with calf compression, swelling, warmth, erythema    Assessment: Status post left partial third toe amputation, healing well  Plan: -All treatment options discussed with the patient including all alternatives, risks, complications.  -Reviewed half the sutures today but left remainder intact.  Will plan to remove these next week.  Small Mehta Betadine was applied followed by dressing.  Discussed that she can wash the foot with soap and water, dry thoroughly apply a similar bandage.  Continue surgical shoe.  Elevation. -Monitor for any clinical signs or symptoms of infection and directed to call the office immediately should any occur or go to the ER.  Return in about 1 week (around 04/20/2023) for suture removal.  Vivi Barrack DPM

## 2023-04-20 ENCOUNTER — Ambulatory Visit (INDEPENDENT_AMBULATORY_CARE_PROVIDER_SITE_OTHER): Payer: Medicare Other | Admitting: Podiatry

## 2023-04-20 DIAGNOSIS — M2042 Other hammer toe(s) (acquired), left foot: Secondary | ICD-10-CM

## 2023-04-20 DIAGNOSIS — M86172 Other acute osteomyelitis, left ankle and foot: Secondary | ICD-10-CM

## 2023-04-23 NOTE — Progress Notes (Signed)
Subjective: No chief complaint on file.   62 year old female presents with above concerns.    She been keeping pressure on the toe.  Denies any drainage or pus or any pain but she does have neuropathy.  Denies any fevers or chills.  She is still wearing a surgical shoe.  No other concerns.   Objective: AAO x3, NAD-wearing surgical shoe DP/PT pulses palpable bilaterally, CRT less than 3 seconds On the area of the amputation site there is some dry, peeling skin present but there is also skin breakdown secondary to skin breakdown as it was quite macerated today.  There is no drainage or pus.  Incision itself appears to be healing well.  There is no exposed bone, tendon.  Minimal edema.  No fluctuation or crepitation. No pain with calf compression, swelling, warmth, erythema   Assessment: Status post left partial third toe amputation  Plan: -All treatment options discussed with the patient including all alternatives, risks, complications.  -Remove the plan of the sutures today.  Incision itself appears to be healing well although she had quite a bit of macerated tissue and skin slough on the toe itself.  There is minimal edema and there is no cellulitis but concerned about the area of skin breakdown.  Advised her not to keep moisturizer on the toe for right now.  Betadine applied followed by Derm.  Discussed daily dressing changes.  Elevation.  Continue surgical shoe.  -Monitor for any clinical signs or symptoms of infection and directed to call the office immediately should any occur or go to the ER.  Return in about 1 week (around 04/27/2023) for wound check .  Vivi Barrack DPM

## 2023-04-27 ENCOUNTER — Encounter: Payer: Medicare Other | Admitting: Podiatry

## 2023-05-01 ENCOUNTER — Ambulatory Visit: Payer: Medicare Other | Admitting: Podiatry

## 2023-05-18 ENCOUNTER — Other Ambulatory Visit: Payer: Self-pay | Admitting: Internal Medicine

## 2023-05-30 ENCOUNTER — Ambulatory Visit (INDEPENDENT_AMBULATORY_CARE_PROVIDER_SITE_OTHER): Payer: Medicare Other | Admitting: Podiatry

## 2023-05-30 ENCOUNTER — Ambulatory Visit (INDEPENDENT_AMBULATORY_CARE_PROVIDER_SITE_OTHER): Payer: Medicare Other

## 2023-05-30 DIAGNOSIS — L97522 Non-pressure chronic ulcer of other part of left foot with fat layer exposed: Secondary | ICD-10-CM | POA: Diagnosis not present

## 2023-05-30 DIAGNOSIS — S98132A Complete traumatic amputation of one left lesser toe, initial encounter: Secondary | ICD-10-CM

## 2023-05-30 DIAGNOSIS — Z794 Long term (current) use of insulin: Secondary | ICD-10-CM

## 2023-05-30 DIAGNOSIS — E1142 Type 2 diabetes mellitus with diabetic polyneuropathy: Secondary | ICD-10-CM

## 2023-05-30 DIAGNOSIS — M778 Other enthesopathies, not elsewhere classified: Secondary | ICD-10-CM | POA: Diagnosis not present

## 2023-05-30 DIAGNOSIS — L84 Corns and callosities: Secondary | ICD-10-CM

## 2023-05-30 DIAGNOSIS — B351 Tinea unguium: Secondary | ICD-10-CM

## 2023-05-30 DIAGNOSIS — M79675 Pain in left toe(s): Secondary | ICD-10-CM

## 2023-05-30 DIAGNOSIS — M79674 Pain in right toe(s): Secondary | ICD-10-CM

## 2023-05-30 MED ORDER — MUPIROCIN 2 % EX OINT: 1.0000 | TOPICAL_OINTMENT | Freq: Two times a day (BID) | CUTANEOUS | 2 refills | Status: DC

## 2023-05-30 MED ORDER — CEPHALEXIN 500 MG PO CAPS: 500.0000 mg | ORAL_CAPSULE | Freq: Two times a day (BID) | ORAL | 0 refills | Status: DC

## 2023-05-30 NOTE — Progress Notes (Unsigned)
Subjective: Chief Complaint  Patient presents with   Wound Check    RM#13 wound check pov #3 patient states having some pain   DOS: 03/29/2023 left partial 3rd toe amputation.    62 year old female presents with above concerns.    Objective: AAO x3, NAD-wearing surgical shoe DP/PT pulses palpable bilaterally, CRT less than 3 seconds On the area of the amputation site there is some dry, peeling skin present but there is also skin breakdown secondary to skin breakdown as it was quite macerated today.  There is no drainage or pus.  Incision itself appears to be healing well.  There is no exposed bone, tendon.  Minimal edema.  No fluctuation or crepitation. No pain with calf compression, swelling, warmth, erythema   Assessment: Status post left partial third toe amputation  Plan: -All treatment options discussed with the patient including all alternatives, risks, complications.  -Remove the plan of the sutures today.  Incision itself appears to be healing well although she had quite a bit of macerated tissue and skin slough on the toe itself.  There is minimal edema and there is no cellulitis but concerned about the area of skin breakdown.  Advised her not to keep moisturizer on the toe for right now.  Betadine applied followed by Derm.  Discussed daily dressing changes.  Elevation.  Continue surgical shoe.  -Monitor for any clinical signs or symptoms of infection and directed to call the office immediately should any occur or go to the ER.  Return in about 1 week (around 04/27/2023) for wound check .  Vivi Barrack DPM  Nail 4/5 b/l Ulcer hallux Dry thried

## 2023-05-30 NOTE — Patient Instructions (Signed)
Monitor for any signs/symptoms of infection. Call the office immediately if any occur or go directly to the emergency room. Call with any questions/concerns.  

## 2023-06-09 ENCOUNTER — Ambulatory Visit (INDEPENDENT_AMBULATORY_CARE_PROVIDER_SITE_OTHER): Payer: 59 | Admitting: Podiatry

## 2023-06-09 ENCOUNTER — Encounter: Payer: Self-pay | Admitting: Podiatry

## 2023-06-09 ENCOUNTER — Ambulatory Visit (INDEPENDENT_AMBULATORY_CARE_PROVIDER_SITE_OTHER): Payer: 59

## 2023-06-09 DIAGNOSIS — M778 Other enthesopathies, not elsewhere classified: Secondary | ICD-10-CM

## 2023-06-09 DIAGNOSIS — S98132A Complete traumatic amputation of one left lesser toe, initial encounter: Secondary | ICD-10-CM

## 2023-06-09 DIAGNOSIS — L97522 Non-pressure chronic ulcer of other part of left foot with fat layer exposed: Secondary | ICD-10-CM | POA: Diagnosis not present

## 2023-06-09 DIAGNOSIS — E1142 Type 2 diabetes mellitus with diabetic polyneuropathy: Secondary | ICD-10-CM

## 2023-06-09 DIAGNOSIS — Z794 Long term (current) use of insulin: Secondary | ICD-10-CM

## 2023-06-09 MED ORDER — PREGABALIN 75 MG PO CAPS: 75.0000 mg | ORAL_CAPSULE | Freq: Two times a day (BID) | ORAL | 0 refills | Status: DC

## 2023-06-09 MED ORDER — DOXYCYCLINE HYCLATE 100 MG PO TABS: 100.0000 mg | ORAL_TABLET | Freq: Two times a day (BID) | ORAL | 0 refills | Status: DC

## 2023-06-09 NOTE — Patient Instructions (Signed)
Monitor for any signs/symptoms of infection. Call the office immediately if any occur or go directly to the emergency room. Call with any questions/concerns.  

## 2023-06-09 NOTE — Progress Notes (Unsigned)
Subjective: Chief Complaint  Patient presents with   Foot Ulcer    Rm#11 Left foot ulcer patient states doing well soreness in the toe .   62 year old female presents today for follow evaluation of ulceration of the left plantar hallux as well as status post third toe amputation.  She has been continuing with antibiotic ointment dressing changes daily.  She is not reporting increasing drainage or purulence but a splinter redness.  She does not Dors any fevers or chills.  She is asking for refill of Lyrica.  She has no other concerns today.  Objective: AAO x3, NAD DP/PT pulses palpable bilaterally, CRT less than 3 seconds On the plantar aspect left hallux is a hyperkeratotic lesion with central granular ulceration.  Prior to debridement the wound was smaller measuring 0.3 x 0.2 x 0.1 cm.  After debridement measures 0.6 x 0.3 x 0.2 cm and there is no probing, undermining or tunneling.  There is no erythema, ascending size.  No fluctuance or crepitation.  There is no malodor.  Hyperkeratotic tissue noted on the left leg.  Area appears to be dry.  There is no fluctuation or crepitation.  No signs of infection at this time to the toe. No pain with calf compression, swelling, warmth, erythema   Assessment: Ulceration left hallux; status post left third toe amputation; neuropathy  Plan: -All treatment options discussed with the patient including all alternatives, risks, complications.  -X-rays obtained reviewed.  3 views of the left foot were obtained.  There is no evidence of acute fracture.  On the lateral aspect of the phalanx of the hallux there is some mild cortical changes noted but looking back at the last x-rays appears to be about the same without any progression.  There is no soft tissue edema. -Medically necessary wound debridements performed today.  I utilized a #312 with scalpel to sharply debride the hyperkeratotic tissue, ulceration down to healthy, granular tissue.  Remove all  nonviable, devitalized tissue to promote wound healing.  Debrided granular tissue.  There is no blood loss.  Hemostasis achieved and cleaned with saline.  Silvadene was applied followed by dressing.  Continue daily dressing changes with antibiotic ointment.  Offloading. -Plan continue doxycycline for now close monitoring of the toe. -Debrided the dry skin of the left third toe.  Antibiotic ointment dressing changes daily. -Refilled Lyrica for neuropathy -Monitor for any clinical signs or symptoms of infection and directed to call the office immediately should any occur or go to the ER.  Vivi Barrack DPM

## 2023-06-23 ENCOUNTER — Encounter: Payer: Self-pay | Admitting: Podiatry

## 2023-06-23 ENCOUNTER — Ambulatory Visit (INDEPENDENT_AMBULATORY_CARE_PROVIDER_SITE_OTHER): Payer: 59 | Admitting: Podiatry

## 2023-06-23 DIAGNOSIS — L97522 Non-pressure chronic ulcer of other part of left foot with fat layer exposed: Secondary | ICD-10-CM | POA: Diagnosis not present

## 2023-06-23 NOTE — Patient Instructions (Signed)
Wash the wound with a little saline. Dry well. Cut a small piece of the Prisma (collagen dressing) and apply a small amount of antibiotic ointment and wrap the toe. Change daily.  Monitor for any signs/symptoms of infection. Call the office immediately if any occur or go directly to the emergency room. Call with any questions/concerns.

## 2023-06-23 NOTE — Progress Notes (Unsigned)
Subjective: Chief Complaint  Patient presents with   Diabetic Ulcer    RM#13 left foot ulcer big toe and third toe follow up.    62 year old female presents today for follow evaluation of ulceration of the left plantar hallux as well as status post third toe amputation.  She thinks the wound is doing better.  She does not report any increasing swelling or redness.  No fevers or chills.  Objective: AAO x3, NAD DP/PT pulses palpable bilaterally, CRT less than 3 seconds On the plantar aspect left hallux is a hyperkeratotic lesion with central granular ulceration.  Prior to debridement the wound was smaller measuring 0.4 x 0.2 x 0.1 cm.  There is quite a bit of hyperkeratotic tissue on the periphery of the wound today which I debrided today.  After debridement measures 1.2 x 0.5 x 0.2 cm and there is no probing, undermining or tunneling.  There is no probing, undermining or tunneling.  There is no surrounding erythema, ascending cellulitis.  There is no fluctuation or crepitation.  There is no malodor.  Third toe amputation is healed. No pain with calf compression, swelling, warmth, erythema   Assessment: Ulceration left hallux; status post left third toe amputation; neuropathy  Plan: -All treatment options discussed with the patient including all alternatives, risks, complications.  -Medically necessary wound debridements performed today.  I was able debride more the hyperkeratotic periwound today.  I utilized a #312 with scalpel to sharply debride the hyperkeratotic tissue, ulceration down to healthy, granular tissue.  Remove all nonviable, devitalized tissue to promote wound healing.  Debrided granular tissue.  There is no blood loss.  Hemostasis achieved and cleaned with saline.  Prisma with antibiotic ointment was applied followed by dressing.  Continue daily dressing changes with antibiotic ointment.  Offloading. -Blood work ordered -Offlading.  I dispensed a new surgical shoe as hers is  worn out I added a peg assist to help further offload the wound. -Previous ABI was WNL on 08/16/2021- She has healed the other amputations and ulcers. If she does not have significant healing next appointment will check a new ABI.  -Monitor for any clinical signs or symptoms of infection and directed to call the office immediately should any occur or go to the ER.  X-ray next appointment   Vivi Barrack DPM

## 2023-07-03 ENCOUNTER — Ambulatory Visit: Payer: 59 | Admitting: Podiatry

## 2023-07-04 NOTE — Progress Notes (Deleted)
 {This patient may be at risk for Amyloid. She has one or more dx on the problem list or PMH from the following list - Abnormal EKG, CHF, Aortic Stenosis, Proteinuria, LVH, Carpal Tunnel Syndrome, Biceps Tendon Rupture, Syncope. See list below or review PMH.  Diagnoses From Problem List           Noted     HFmrEF (heart failure with mildly reduced ejection fraction) (HCC) 01/30/2012    Click HERE to open Cardiac Amyloid Screening SmartSet to order screening OR Click HERE to defer testing for 1 year or permanently :1}    Cardiology Office Note:    Date:  07/04/2023  ID:  [name], DOB 08/26/1960, MRN [id number] PCP: Claudene Prentice DELENA Mickey., FNP  [facility] Providers Cardiologist:  Ozell Fell, MD Cardiology APP:  Lelon Glendia DASEN, PA-C { Click to update primary MD,subspecialty MD or APP then REFRESH:1}    {Click to Open Review  :1}   Patient Profile:      Coronary artery disease  NSTEMI 04/2018 >> Cath w/ diffuse disease >> no targets for CABG >> Med Rx Cath: pLAD 50, mLAD 80, dLAD 70; oD1 95, mLCx 90, dLCx 80, OM2 95, mRCA 30, dRCA 30 PCI could be considered if medical Rx failed Chronic Systolic CHF Non-ischemic cardiomyopathy (CM out of proportion to extensive CAD)  Echocardiogram 7/13: EF 35-40 Echocardiogram 11/16: EF 55-60 Echocardiogram 8/18: EF 50-55 Echocardiogram 11/19: Mild conc LVH, EF 45-50, diff HK, normal RVSF  Aortic atherosclerosis  ABIs 01/22/20: R 1.01, L 0.96 (normal) Morbid obesity  Diabetes mellitus  Hypertension  Hyperlipidemia  Chronic kidney disease Lung nodule (followed by primary care)      {      :1}   History of Present Illness:  Discussed the use of AI scribe software for clinical note transcription with the patient, who gave verbal consent to proceed.  [name] is a 63 y.o. female who returns for follow up of CAD, CHF. She was last seen in 04/2022.          ROS-See HPI ***    Studies Reviewed:       *** Results           Risk Assessment/Calculations:   {Does this patient have ATRIAL FIBRILLATION?:[phone]} No BP recorded.  {Refresh Note OR Click here to enter BP  :1}***       Physical Exam:   VS:  LMP 02/13/2011    Wt Readings from Last 3 Encounters:  04/03/23 239 lb (108.4 kg)  08/19/22 239 lb (108.4 kg)  05/11/22 234 lb 9.6 oz (106.4 kg)    Physical Exam***     Assessment and Plan:   Assessment & Plan Coronary artery disease involving native coronary artery of native heart with angina pectoris (HCC)  HFmrEF (heart failure with mildly reduced ejection fraction) (HCC)  Essential hypertension  Pure hypercholesterolemia   Assessment and Plan             {   CAD (coronary artery disease) Status post non-STEMI in November 2019.  She has diffuse small vessel and distal vessel disease which has been managed medically. She is doing well w/o angina on her current regimen. Continue Plavix  75 mg once daily, Imdur  60 mg once daily, Ranexa  500 mg twice daily, Crestor  40 mg once daily. Her HR tends to run high. I will increase Metoprolol  Tartrate to 100 mg twice daily. F/u in 6 mos.   HFmrEF (heart failure with  mildly reduced ejection fraction) (HCC) Non-ischemic cardiomyopathy. EF 45-50. Volume status stable. NYHA II-IIb. Continue Jardiance  25 mg once daily (higher dose for diabetes), Imdur  60 mg once daily, Lisinopril  2.5 mg once daily. Increase Metoprolol  Tartrate to 100 mg twice daily as noted. F/u in 6 mos.     Hyperlipidemia LDL optimal on most recent lab work.  Continue current Rx.     Essential hypertension BP controlled. Adjust Metoprolol  dose for coronary artery disease, congestive heart failure. Continue Imdur  60 mg once daily, Lisinopril  2.5 mg once daily.    Type 2 diabetes mellitus with retinopathy, with long-term current use of insulin  (HCC) Managed by endocrinology.    :1}    {Are you ordering a CV Procedure (e.g. stress test, cath, DCCV, TEE, etc)?   Press F2        :[id number]}   Dispo:  No follow-ups on file.  Signed, Glendia Ferrier, PA-C

## 2023-07-07 ENCOUNTER — Ambulatory Visit: Payer: 59 | Admitting: Physician Assistant

## 2023-07-07 DIAGNOSIS — I25119 Atherosclerotic heart disease of native coronary artery with unspecified angina pectoris: Secondary | ICD-10-CM

## 2023-07-07 DIAGNOSIS — I5020 Unspecified systolic (congestive) heart failure: Secondary | ICD-10-CM

## 2023-07-07 DIAGNOSIS — I10 Essential (primary) hypertension: Secondary | ICD-10-CM

## 2023-07-07 DIAGNOSIS — E7800 Pure hypercholesterolemia, unspecified: Secondary | ICD-10-CM

## 2023-07-12 ENCOUNTER — Ambulatory Visit (INDEPENDENT_AMBULATORY_CARE_PROVIDER_SITE_OTHER): Payer: 59 | Admitting: Internal Medicine

## 2023-07-12 ENCOUNTER — Encounter: Payer: Self-pay | Admitting: Internal Medicine

## 2023-07-12 VITALS — BP 126/70 | HR 80 | Ht 66.0 in | Wt 235.0 lb

## 2023-07-12 DIAGNOSIS — E1122 Type 2 diabetes mellitus with diabetic chronic kidney disease: Secondary | ICD-10-CM | POA: Diagnosis not present

## 2023-07-12 DIAGNOSIS — E11319 Type 2 diabetes mellitus with unspecified diabetic retinopathy without macular edema: Secondary | ICD-10-CM | POA: Diagnosis not present

## 2023-07-12 DIAGNOSIS — E1159 Type 2 diabetes mellitus with other circulatory complications: Secondary | ICD-10-CM

## 2023-07-12 DIAGNOSIS — Z7985 Long-term (current) use of injectable non-insulin antidiabetic drugs: Secondary | ICD-10-CM

## 2023-07-12 DIAGNOSIS — Z794 Long term (current) use of insulin: Secondary | ICD-10-CM

## 2023-07-12 DIAGNOSIS — S98139A Complete traumatic amputation of one unspecified lesser toe, initial encounter: Secondary | ICD-10-CM

## 2023-07-12 DIAGNOSIS — Z7984 Long term (current) use of oral hypoglycemic drugs: Secondary | ICD-10-CM | POA: Diagnosis not present

## 2023-07-12 DIAGNOSIS — E1142 Type 2 diabetes mellitus with diabetic polyneuropathy: Secondary | ICD-10-CM

## 2023-07-12 LAB — POCT GLYCOSYLATED HEMOGLOBIN (HGB A1C): Hemoglobin A1C: 10.2 % — AB (ref 4.0–5.6)

## 2023-07-12 NOTE — Progress Notes (Signed)
Name: [name]  Age/ Sex: 63 y.o., female   MRN/ DOB: [id number], Dec 22, 1960     PCP: Raymon Mutton., FNP   Reason for Endocrinology Evaluation: Type 2 Diabetes Mellitus  Initial Endocrine Consultative Visit: 10/07/2019    PATIENT IDENTIFIER: [name] is a 63 y.o. female with a past medical history of HTn and T2DM. The patient has followed with Endocrinology clinic since 10/07/2019 for consultative assistance with management of her diabetes.      DIABETIC HISTORY:  [name] was diagnosed with gestational diabetes in 1984, requiring insulin , she was off insulin until her next pregnancy in 1985 and has been on insulin ever since. Her hemoglobin A1c has ranged from 8.1% in 2016, peaking at 12.5% in 2020.   On her initial visit to our clinic, she had an A1c of 9.6%. She was on MDI regimen as well as victoza and metformin, which were continued and added jardiance.   Developed yeast infections to Jardiance 12/2019  Switched victoza to trulicity 12/2019  Switched to ozempic 2024  Metformin discontinued 2024 due to low GFR   SUBJECTIVE:   During the last visit (02/24/2023): A1c 9.6%    Today (07/12/2023): [name] is here for a follow up on diabetes management.  She checks her blood sugars multiple times daily through CGM. She has  hypoglycemic episodes since her last visit. She is symptomatic with these episodes.    The patient continues to follow-up with podiatry for left foot ulcer, s/p left 3rd toe amputation 03/2023  Denies nausea, vomiting  Has transient  bowel changes with Ozempic intake    HOME DIABETES REGIMEN:  Tresiba  44 units daily Humalog 14 units with each meal Jardiance 25 mg daily  Ozempic 2 mg weekly  CF: Humalog ((BG-130/30) TIDQAC    Statin: yes ACE-I/ARB: yes     CONTINUOUS GLUCOSE MONITORING RECORD INTERPRETATION    Dates of Recording:  1/2-1/15/2025  Sensor description:dexcom  Results statistics:   CGM use % of  time 57  Average and SD 193/53  Time in range 42 %  % Time Above 180 43  % Time above 250 15  % Time Below target 0     Glycemic patterns summary: BGs are optimal overnight and fluctuate during the day  Hyperglycemic episodes  postprandial   Hypoglycemic episodes occurred n/a   Overnight periods: Variable but mostly optimal    Microvascular complications:  Neuropathy, hx left 3rd toe amputation, retinopathy  Denies: CKD  Last eye exam: Completed 06/2019   Macrovascular complications:  CAD, CHF Denies:  PVD, CVA       HISTORY:  Past Medical History:  Past Medical History:  Diagnosis Date   Adhesive capsulitis of shoulder    bilateral, Steroid injection Dr. Nedra Hai 1/12 bilaterally   CAD (coronary artery disease)    nonobstructive. Last cardiac cath (2008) showing left circumflex with mid 50% stenosis and distal luminal irregularities. Also with RCA with mid to distal 30-40% stenosis. // Previously evaluated by [facility], never followed up outpatient.   CAP (community acquired pneumonia) 06/15/2012   05/2012 CXR: Mild opacification of the posterior lung base on the lateral film  as cannot exclude infection/atelectasis.     CHF (congestive heart failure) (HCC)    Diabetes mellitus 2007   Type II, insulin dependent   Diabetic retinopathy    GERD (gastroesophageal reflux disease)    Glaucoma    Hyperlipidemia    Hypertension    Obesity  Peripheral neuropathy    Past Surgical History:  Past Surgical History:  Procedure Laterality Date   ANTERIOR CERVICAL DECOMP/DISCECTOMY FUSION N/A 06/16/2016   Procedure: Anterior Cervical Decompression/discectomy Fusion - Cervical six - Cervical seven;  Surgeon: Tia Alert, MD;  Location: [facility] OR;  Service: Neurosurgery;  Laterality: N/A;  Anterior Cervical Decompression/discectomy Fusion - Cervical six - Cervical seven   APPENDECTOMY     CARDIAC CATHETERIZATION     CATARACT EXTRACTION     GLAUCOMA SURGERY     LEFT  HEART CATH AND CORONARY ANGIOGRAPHY N/A 05/22/2018   Procedure: LEFT HEART CATH AND CORONARY ANGIOGRAPHY;  Surgeon: Lennette Bihari, MD;  Location: [facility] INVASIVE CV LAB;  Service: Cardiovascular;  Laterality: N/A;   LEFT HEART CATHETERIZATION WITH CORONARY ANGIOGRAM N/A 01/24/2012   Procedure: LEFT HEART CATHETERIZATION WITH CORONARY ANGIOGRAM;  Surgeon: Tonny Bollman, MD;  Location: [facility] CATH LAB;  Service: Cardiovascular;  Laterality: N/A;   POSTERIOR CERVICAL FUSION/FORAMINOTOMY N/A 10/22/2015   Procedure: Cervical three-Cervical Seven Posterior cervical fusion with lateral mass fixation,  Laminectomy Cervical Three-Cervical Seven;  Surgeon: Tia Alert, MD;  Location: [facility] NEURO ORS;  Service: Neurosurgery;  Laterality: N/A;  Posterior   TUBAL LIGATION     Social History:  reports that she quit smoking about 17 years ago. Her smoking use included cigarettes. She started smoking about 24 years ago. She has a 2.1 pack-year smoking history. She has never used smokeless tobacco. She reports that she does not drink alcohol and does not use drugs. Family History:  Family History  Problem Relation Age of Onset   Heart disease Mother    Hypertension Sister    Hypertension Sister    Hypertension Sister    Heart attack Neg Hx    Stroke Neg Hx      HOME MEDICATIONS: Allergies as of 07/12/2023   No Known Allergies      Medication List        Accurate as of July 12, 2023  2:42 PM. If you have any questions, ask your nurse or doctor.          amitriptyline 25 MG tablet Commonly known as: ELAVIL Take 1 tablet (25 mg total) by mouth at bedtime.   ammonium lactate 12 % cream Commonly known as: AMLACTIN Apply 1 Application topically as needed for dry skin.   azelastine 0.1 % nasal spray Commonly known as: ASTELIN azelastine 137 mcg (0.1 %) nasal spray aerosol   B-D SINGLE USE SWABS REGULAR Pads Use as directed   benzonatate 200 MG capsule Commonly known as: TESSALON Take 1  capsule (200 mg total) by mouth 3 (three) times daily as needed for cough.   clopidogrel 75 MG tablet Commonly known as: PLAVIX Take 1 tablet (75 mg total) by mouth daily.   Combigan 0.2-0.5 % ophthalmic solution Generic drug: brimonidine-timolol Place 1 drop into both eyes 2 (two) times daily.   Dexcom G6 Transmitter Misc 1 Device by Does not apply route as directed.   Dexcom G7 Receiver Devi Use to check blood sugars   Dexcom G7 Sensor Misc Change sensor every 10 days   diphenhydrAMINE 25 MG tablet Commonly known as: BENADRYL Take 1 tablet (25 mg total) by mouth every 6 (six) hours as needed for itching or allergies.   dorzolamide 2 % ophthalmic solution Commonly known as: TRUSOPT Place 1 drop into both eyes 2 (two) times daily.   doxycycline 100 MG tablet Commonly known as: VIBRA-TABS Take 1 tablet (100 mg total)  by mouth 2 (two) times daily.   empagliflozin 25 MG Tabs tablet Commonly known as: Jardiance Take 1 tablet (25 mg total) by mouth daily.   fluticasone 50 MCG/ACT nasal spray Commonly known as: Flonase Place 1 spray into both nostrils daily.   furosemide 20 MG tablet Commonly known as: LASIX Take 20 mg by mouth 2 (two) times daily.   glucose blood test strip Commonly known as: OneTouch Verio USE TO CHECK BLOOD SUGAR 4 TIMES DAILY AS DIRECTED   GNP Truetrack Test Strips test strip Generic drug: glucose blood Use as instructed   True Metrix Blood Glucose Test test strip Generic drug: glucose blood Use as instructed   GNP True Metrix Air Meter w/Device Kit 1 kit by Does not apply route 4 (four) times daily. E11.42   Guaifenesin 1200 MG Tb12 Commonly known as: Mucinex Maximum Strength Take 1 tablet (1,200 mg total) by mouth every 12 (twelve) hours as needed.   HYDROcodone-acetaminophen 5-325 MG tablet Commonly known as: NORCO/VICODIN Take 1-2 tablets by mouth every 6 (six) hours as needed.   insulin lispro 100 UNIT/ML KwikPen Commonly known  as: HumaLOG KwikPen Max daily 60 units   Insulin Pen Needle 31G X 5 MM Misc 1 Device by Does not apply route in the morning, at noon, in the evening, and at bedtime.   isosorbide mononitrate 60 MG 24 hr tablet Commonly known as: IMDUR TAKE 1 TABLET EVERY DAY   lisinopril 2.5 MG tablet Commonly known as: ZESTRIL TAKE 1 TABLET(2.5 MG) BY MOUTH DAILY   metFORMIN 1000 MG tablet Commonly known as: GLUCOPHAGE Take 1 tablet (1,000 mg total) by mouth daily with breakfast.   metoprolol tartrate 100 MG tablet Commonly known as: LOPRESSOR Take 1 tablet (100 mg total) by mouth 2 (two) times daily.   montelukast 10 MG tablet Commonly known as: SINGULAIR montelukast 10 mg tablet   mupirocin ointment 2 % Commonly known as: BACTROBAN Apply 1 Application topically 2 (two) times daily.   mupirocin ointment 2 % Commonly known as: BACTROBAN Apply 1 Application topically 2 (two) times daily.   nitroGLYCERIN 0.4 MG SL tablet Commonly known as: NITROSTAT DISSOLVE 1 TABLET (0.4 MG TOTAL) UNDER THE TONGUE EVERY 5 MINUTES AS NEEDED FOR CHEST PAIN.   OneTouch Delica Lancets Fine Misc Check blood sugar as instructed up to 3 times a day   TRUEplus Lancets 33G Misc Check 4 times a daily   oxyCODONE-acetaminophen 5-325 MG tablet Commonly known as: PERCOCET/ROXICET Take 1 tablet by mouth every 8 (eight) hours as needed.   pantoprazole 40 MG tablet Commonly known as: PROTONIX Take 1 tablet (40 mg total) by mouth daily.   pregabalin 75 MG capsule Commonly known as: Lyrica Take 1 capsule (75 mg total) by mouth 2 (two) times daily.   promethazine 25 MG tablet Commonly known as: PHENERGAN Take 1 tablet (25 mg total) by mouth every 8 (eight) hours as needed for nausea or vomiting.   promethazine 25 MG tablet Commonly known as: PHENERGAN Take 1 tablet (25 mg total) by mouth every 8 (eight) hours as needed for nausea or vomiting.   ranolazine 500 MG 12 hr tablet Commonly known as:  Ranexa Take 1 tablet (500 mg total) by mouth 2 (two) times daily.   Rocklatan 0.02-0.005 % Soln Generic drug: Netarsudil-Latanoprost Rocklatan 0.02 %-0.005 % eye drops   rosuvastatin 40 MG tablet Commonly known as: CRESTOR TAKE 1 TABLET(40 MG) BY MOUTH DAILY   Santyl 250 UNIT/GM ointment Generic drug: collagenase Apply 1  application topically daily as needed (foot).   Semaglutide (2 MG/DOSE) 8 MG/3ML Sopn Inject 2 mg as directed once a week.   silver sulfADIAZINE 1 % cream Commonly known as: Silvadene Apply 1 Application topically daily.   Evaristo Bury FlexTouch 100 UNIT/ML FlexTouch Pen Generic drug: insulin degludec Inject 44 Units into the skin daily.   True Metrix Level 1 Low Soln Use as directed         OBJECTIVE:   Vital Signs: BP 126/70 (BP Location: Right Arm, Patient Position: Sitting, Cuff Size: Normal)   Pulse 80   Ht 5\' 6"  (1.676 m)   Wt 235 lb (106.6 kg)   LMP 02/13/2011   SpO2 98%   BMI 37.93 kg/m   Wt Readings from Last 3 Encounters:  07/12/23 235 lb (106.6 kg)  04/03/23 239 lb (108.4 kg)  08/19/22 239 lb (108.4 kg)     Exam: General: Pt appears well and is in NAD  Lungs: CTA  Heart: RRR  Extremities: Left LE boot in place   Neuro: MS is good with appropriate affect, pt is alert and Ox3     DM foot exam: per podiatry 06/23/2023    DATA REVIEWED:  Lab Results  Component Value Date   HGBA1C 10.2 (A) 07/12/2023   HGBA1C 9.6 (A) 08/19/2022   HGBA1C 10.0 (A) 02/15/2022    ASSESSMENT / PLAN / RECOMMENDATIONS:   1) Type 2 Diabetes Mellitus, Poorly controlled, With retinopathic,  neuropathic, and macrovascular  complications complications - Most recent A1c of 10.2%. Goal A1c < 7.0 %.    -Unfortunately, she continues with poorly controlled diabetes despite escalating her insulin doses, I suspect imperfect adherence to insulin intake -I have recommended insulin pump technology, we will initiate tandem pump -A referral to our CDE for  training has been initiated -I will increase her basal insulin,as well as her prandial insulin  -Metformin discontinued due to low GFR -Patient will return for fasting glucose and C-peptide  MEDICATIONS: - Increase Tresiba to 44 units daily  - Increase Humalog 14 units with each meal  - Continue  Jardiance  25 mg daily -Continue Ozempic 2 mg weekly - CF : Humalog  (BG -130/30) TIDQAC   EDUCATION / INSTRUCTIONS: BG monitoring instructions: Patient is instructed to check her blood sugars 4 times a day, before meals and bedtime  Call [facility] if: BG persistently < 70  I reviewed the Rule of 15 for the treatment of hypoglycemia in detail with the patient. Literature supplied.    2) Diabetic complications:  Eye: Does have known diabetic retinopathy.  Neuro/ Feet: Does have known diabetic peripheral neuropathy .  Renal: Patient does not have known baseline CKD. She   is on an ACEI/ARB at present.      F/U in 3 months    Signed electronically by: Lyndle Herrlich, MD  [facility]  [facility] [address] [place] [place], [place] [address] Phone: [phone] FAX: [phone]   CC: Raymon Mutton., FNP [address] [place] [address] Phone: [phone]  Fax: [phone]  Return to Endocrinology clinic as below: Future Appointments  Date Time Provider Department Center  07/25/2023  8:15 AM Dyann Kief, PA-C [facility]

## 2023-07-12 NOTE — Patient Instructions (Signed)
-   Continue  Tresiba   44 units daily  - Continue  Jardiance   25 mg daily - Continues Ozempic  2 mg ONCE WEEKLY  -Novolog  correctional insulin :  Use the scale below to help guide you BEFORE each meal   Blood sugar before meal Number of units to inject  Less than 160 14 unit  161 -  190 15 units  191 -  220 16 units  241 -  250 17 units  251 -  280 18  units  281 -  310 19 units  311 -  340 20 units  341 -  370 21 units  371 -  400 22 units  401 - 430 23 units      HOW TO TREAT LOW BLOOD SUGARS (Blood sugar LESS THAN 70 MG/DL) Please follow the RULE OF 15 for the treatment of hypoglycemia treatment (when your (blood sugars are less than 70 mg/dL)   STEP 1: Take 15 grams of carbohydrates when your blood sugar is low, which includes:  3-4 GLUCOSE TABS  OR 3-4 OZ OF JUICE OR REGULAR SODA OR ONE TUBE OF GLUCOSE GEL    STEP 2: RECHECK blood sugar in 15 MINUTES STEP 3: If your blood sugar is still low at the 15 minute recheck --> then, go back to STEP 1 and treat AGAIN with another 15 grams of carbohydrates

## 2023-07-13 ENCOUNTER — Telehealth: Payer: Self-pay | Admitting: Internal Medicine

## 2023-07-13 ENCOUNTER — Encounter: Payer: Self-pay | Admitting: Internal Medicine

## 2023-07-13 NOTE — Telephone Encounter (Signed)
Can you please contact the tandem lab Thayer [employer]m  [phone] or Velna Ochs at [phone]  To request initiating this patient on the tandem pump    Thanks

## 2023-07-14 ENCOUNTER — Other Ambulatory Visit: Payer: 59

## 2023-07-14 NOTE — Telephone Encounter (Signed)
Order has been placed on [employer] for the Tandem pump

## 2023-07-15 LAB — BASIC METABOLIC PANEL
BUN/Creatinine Ratio: 21 (calc) (ref 6–22)
BUN: 29 mg/dL — ABNORMAL HIGH (ref 7–25)
CO2: 28 mmol/L (ref 20–32)
Calcium: 9.3 mg/dL (ref 8.6–10.4)
Chloride: 104 mmol/L (ref 98–110)
Creat: 1.36 mg/dL — ABNORMAL HIGH (ref 0.50–1.05)
Glucose, Bld: 204 mg/dL — ABNORMAL HIGH (ref 65–99)
Potassium: 4.6 mmol/L (ref 3.5–5.3)
Sodium: 142 mmol/L (ref 135–146)

## 2023-07-15 LAB — C-PEPTIDE: C-Peptide: 2.76 ng/mL (ref 0.80–3.85)

## 2023-07-20 ENCOUNTER — Emergency Department (HOSPITAL_COMMUNITY): Payer: 59

## 2023-07-20 ENCOUNTER — Ambulatory Visit (INDEPENDENT_AMBULATORY_CARE_PROVIDER_SITE_OTHER): Payer: 59

## 2023-07-20 ENCOUNTER — Other Ambulatory Visit: Payer: Self-pay

## 2023-07-20 ENCOUNTER — Encounter (HOSPITAL_COMMUNITY): Payer: Self-pay

## 2023-07-20 ENCOUNTER — Inpatient Hospital Stay (HOSPITAL_COMMUNITY)
Admission: EM | Admit: 2023-07-20 | Discharge: 2023-07-23 | DRG: 617 | Disposition: A | Discharge status: Home Health | Payer: 59 | Attending: Internal Medicine | Admitting: Internal Medicine

## 2023-07-20 ENCOUNTER — Ambulatory Visit (INDEPENDENT_AMBULATORY_CARE_PROVIDER_SITE_OTHER): Payer: 59 | Admitting: Podiatry

## 2023-07-20 ENCOUNTER — Telehealth: Payer: Self-pay

## 2023-07-20 VITALS — BP 176/86 | HR 146 | Temp 98.4°F

## 2023-07-20 DIAGNOSIS — Z6837 Body mass index (BMI) 37.0-37.9, adult: Secondary | ICD-10-CM

## 2023-07-20 DIAGNOSIS — E11319 Type 2 diabetes mellitus with unspecified diabetic retinopathy without macular edema: Secondary | ICD-10-CM | POA: Diagnosis present

## 2023-07-20 DIAGNOSIS — M86172 Other acute osteomyelitis, left ankle and foot: Secondary | ICD-10-CM

## 2023-07-20 DIAGNOSIS — E785 Hyperlipidemia, unspecified: Secondary | ICD-10-CM | POA: Diagnosis present

## 2023-07-20 DIAGNOSIS — I96 Gangrene, not elsewhere classified: Secondary | ICD-10-CM | POA: Diagnosis present

## 2023-07-20 DIAGNOSIS — Z7902 Long term (current) use of antithrombotics/antiplatelets: Secondary | ICD-10-CM | POA: Diagnosis not present

## 2023-07-20 DIAGNOSIS — Z89422 Acquired absence of other left toe(s): Secondary | ICD-10-CM

## 2023-07-20 DIAGNOSIS — Z7985 Long-term (current) use of injectable non-insulin antidiabetic drugs: Secondary | ICD-10-CM

## 2023-07-20 DIAGNOSIS — I959 Hypotension, unspecified: Secondary | ICD-10-CM | POA: Diagnosis not present

## 2023-07-20 DIAGNOSIS — Z7984 Long term (current) use of oral hypoglycemic drugs: Secondary | ICD-10-CM

## 2023-07-20 DIAGNOSIS — I5022 Chronic systolic (congestive) heart failure: Secondary | ICD-10-CM | POA: Diagnosis present

## 2023-07-20 DIAGNOSIS — M778 Other enthesopathies, not elsewhere classified: Secondary | ICD-10-CM

## 2023-07-20 DIAGNOSIS — K760 Fatty (change of) liver, not elsewhere classified: Secondary | ICD-10-CM | POA: Diagnosis present

## 2023-07-20 DIAGNOSIS — I110 Hypertensive heart disease with heart failure: Secondary | ICD-10-CM | POA: Diagnosis present

## 2023-07-20 DIAGNOSIS — E11621 Type 2 diabetes mellitus with foot ulcer: Secondary | ICD-10-CM | POA: Diagnosis present

## 2023-07-20 DIAGNOSIS — E66812 Obesity, class 2: Secondary | ICD-10-CM | POA: Diagnosis present

## 2023-07-20 DIAGNOSIS — I10 Essential (primary) hypertension: Secondary | ICD-10-CM | POA: Diagnosis not present

## 2023-07-20 DIAGNOSIS — E1152 Type 2 diabetes mellitus with diabetic peripheral angiopathy with gangrene: Secondary | ICD-10-CM | POA: Diagnosis present

## 2023-07-20 DIAGNOSIS — Z79899 Other long term (current) drug therapy: Secondary | ICD-10-CM | POA: Diagnosis not present

## 2023-07-20 DIAGNOSIS — K219 Gastro-esophageal reflux disease without esophagitis: Secondary | ICD-10-CM | POA: Diagnosis present

## 2023-07-20 DIAGNOSIS — I2510 Atherosclerotic heart disease of native coronary artery without angina pectoris: Secondary | ICD-10-CM | POA: Diagnosis present

## 2023-07-20 DIAGNOSIS — Z7982 Long term (current) use of aspirin: Secondary | ICD-10-CM

## 2023-07-20 DIAGNOSIS — I252 Old myocardial infarction: Secondary | ICD-10-CM

## 2023-07-20 DIAGNOSIS — I5020 Unspecified systolic (congestive) heart failure: Secondary | ICD-10-CM | POA: Diagnosis not present

## 2023-07-20 DIAGNOSIS — Z87891 Personal history of nicotine dependence: Secondary | ICD-10-CM | POA: Diagnosis not present

## 2023-07-20 DIAGNOSIS — E1142 Type 2 diabetes mellitus with diabetic polyneuropathy: Secondary | ICD-10-CM | POA: Diagnosis present

## 2023-07-20 DIAGNOSIS — Z794 Long term (current) use of insulin: Secondary | ICD-10-CM

## 2023-07-20 DIAGNOSIS — L97529 Non-pressure chronic ulcer of other part of left foot with unspecified severity: Secondary | ICD-10-CM | POA: Diagnosis present

## 2023-07-20 DIAGNOSIS — M7752 Other enthesopathy of left foot: Secondary | ICD-10-CM

## 2023-07-20 DIAGNOSIS — I70269 Atherosclerosis of native arteries of extremities with gangrene, unspecified extremity: Secondary | ICD-10-CM | POA: Diagnosis not present

## 2023-07-20 DIAGNOSIS — M869 Osteomyelitis, unspecified: Secondary | ICD-10-CM | POA: Diagnosis present

## 2023-07-20 DIAGNOSIS — E1165 Type 2 diabetes mellitus with hyperglycemia: Secondary | ICD-10-CM | POA: Diagnosis not present

## 2023-07-20 DIAGNOSIS — E7800 Pure hypercholesterolemia, unspecified: Secondary | ICD-10-CM | POA: Diagnosis not present

## 2023-07-20 DIAGNOSIS — I2583 Coronary atherosclerosis due to lipid rich plaque: Secondary | ICD-10-CM | POA: Diagnosis not present

## 2023-07-20 DIAGNOSIS — E1169 Type 2 diabetes mellitus with other specified complication: Principal | ICD-10-CM | POA: Diagnosis present

## 2023-07-20 DIAGNOSIS — Z8249 Family history of ischemic heart disease and other diseases of the circulatory system: Secondary | ICD-10-CM

## 2023-07-20 DIAGNOSIS — I25119 Atherosclerotic heart disease of native coronary artery with unspecified angina pectoris: Secondary | ICD-10-CM | POA: Diagnosis not present

## 2023-07-20 LAB — CBC WITH DIFFERENTIAL/PLATELET
Abs Immature Granulocytes: 0.05 10*3/uL (ref 0.00–0.07)
Basophils Absolute: 0.1 10*3/uL (ref 0.0–0.1)
Basophils Relative: 0 %
Eosinophils Absolute: 0.1 10*3/uL (ref 0.0–0.5)
Eosinophils Relative: 1 %
HCT: 39.9 % (ref 36.0–46.0)
Hemoglobin: 12.2 g/dL (ref 12.0–15.0)
Immature Granulocytes: 0 %
Lymphocytes Relative: 30 %
Lymphs Abs: 3.8 10*3/uL (ref 0.7–4.0)
MCH: 27.4 pg (ref 26.0–34.0)
MCHC: 30.6 g/dL (ref 30.0–36.0)
MCV: 89.7 fL (ref 80.0–100.0)
Monocytes Absolute: 0.6 10*3/uL (ref 0.1–1.0)
Monocytes Relative: 5 %
Neutro Abs: 8 10*3/uL — ABNORMAL HIGH (ref 1.7–7.7)
Neutrophils Relative %: 64 %
Platelets: 247 10*3/uL (ref 150–400)
RBC: 4.45 MIL/uL (ref 3.87–5.11)
RDW: 15.8 % — ABNORMAL HIGH (ref 11.5–15.5)
WBC: 12.7 10*3/uL — ABNORMAL HIGH (ref 4.0–10.5)
nRBC: 0 % (ref 0.0–0.2)

## 2023-07-20 LAB — I-STAT CHEM 8, ED
BUN: 24 mg/dL — ABNORMAL HIGH (ref 8–23)
Calcium, Ion: 1.07 mmol/L — ABNORMAL LOW (ref 1.15–1.40)
Chloride: 105 mmol/L (ref 98–111)
Creatinine, Ser: 0.9 mg/dL (ref 0.44–1.00)
Glucose, Bld: 158 mg/dL — ABNORMAL HIGH (ref 70–99)
HCT: 39 % (ref 36.0–46.0)
Hemoglobin: 13.3 g/dL (ref 12.0–15.0)
Potassium: 4.3 mmol/L (ref 3.5–5.1)
Sodium: 138 mmol/L (ref 135–145)
TCO2: 24 mmol/L (ref 22–32)

## 2023-07-20 LAB — SEDIMENTATION RATE: Sed Rate: 63 mm/h — ABNORMAL HIGH (ref 0–22)

## 2023-07-20 LAB — C-REACTIVE PROTEIN: CRP: 2.1 mg/dL — ABNORMAL HIGH (ref <1.0)

## 2023-07-20 LAB — GLUCOSE, CAPILLARY: Glucose-Capillary: 246 mg/dL — ABNORMAL HIGH (ref 70–99)

## 2023-07-20 MED ORDER — VANCOMYCIN VARIABLE DOSE PER UNSTABLE RENAL FUNCTION (PHARMACIST DOSING): Status: DC

## 2023-07-20 MED ORDER — METRONIDAZOLE 500 MG/100ML IV SOLN
500.0000 mg | Freq: Once | INTRAVENOUS | Status: AC
  Administered 2023-07-20: 500 mg via INTRAVENOUS
  Filled 2023-07-20: qty 100

## 2023-07-20 MED ORDER — HYDROMORPHONE HCL 1 MG/ML IJ SOLN
0.5000 mg | INTRAMUSCULAR | Status: DC | PRN
  Administered 2023-07-20 – 2023-07-22 (×3): 0.5 mg via INTRAVENOUS
  Filled 2023-07-20 (×4): qty 0.5

## 2023-07-20 MED ORDER — DIPHENHYDRAMINE HCL 25 MG PO CAPS: 25.0000 mg | ORAL_CAPSULE | Freq: Four times a day (QID) | ORAL | Status: DC | PRN

## 2023-07-20 MED ORDER — METRONIDAZOLE 500 MG/100ML IV SOLN: 500.0000 mg | Freq: Two times a day (BID) | INTRAVENOUS | Status: DC

## 2023-07-20 MED ORDER — MELATONIN 5 MG PO TABS
5.0000 mg | ORAL_TABLET | Freq: Every evening | ORAL | Status: DC | PRN
  Administered 2023-07-20: 5 mg via ORAL
  Filled 2023-07-20: qty 1

## 2023-07-20 MED ORDER — INSULIN ASPART 100 UNIT/ML IJ SOLN
0.0000 [IU] | Freq: Every day | INTRAMUSCULAR | Status: DC
  Administered 2023-07-20 – 2023-07-22 (×3): 2 [IU] via SUBCUTANEOUS

## 2023-07-20 MED ORDER — NETARSUDIL-LATANOPROST 0.02-0.005 % OP SOLN: 1.0000 [drp] | Freq: Every day | OPHTHALMIC | Status: DC

## 2023-07-20 MED ORDER — SODIUM CHLORIDE 0.9 % IV SOLN: 2.0000 g | Freq: Three times a day (TID) | INTRAVENOUS | Status: DC

## 2023-07-20 MED ORDER — HYDROMORPHONE HCL 1 MG/ML IJ SOLN
0.5000 mg | Freq: Once | INTRAMUSCULAR | Status: AC
  Administered 2023-07-20: 0.5 mg via INTRAVENOUS
  Filled 2023-07-20: qty 1

## 2023-07-20 MED ORDER — OXYCODONE HCL 5 MG PO TABS
5.0000 mg | ORAL_TABLET | Freq: Four times a day (QID) | ORAL | Status: DC | PRN
  Administered 2023-07-22 – 2023-07-23 (×2): 5 mg via ORAL
  Filled 2023-07-20 (×2): qty 1

## 2023-07-20 MED ORDER — DORZOLAMIDE HCL 2 % OP SOLN
1.0000 [drp] | Freq: Two times a day (BID) | OPHTHALMIC | Status: DC
  Administered 2023-07-21 – 2023-07-23 (×5): 1 [drp] via OPHTHALMIC
  Filled 2023-07-20 (×2): qty 10

## 2023-07-20 MED ORDER — SODIUM CHLORIDE 0.9 % IV SOLN
2.0000 g | Freq: Once | INTRAVENOUS | Status: AC
  Administered 2023-07-20: 2 g via INTRAVENOUS
  Filled 2023-07-20: qty 12.5

## 2023-07-20 MED ORDER — POLYETHYLENE GLYCOL 3350 17 G PO PACK: 17.0000 g | PACK | Freq: Every day | ORAL | Status: DC | PRN

## 2023-07-20 MED ORDER — LACTATED RINGERS IV BOLUS
1000.0000 mL | Freq: Once | INTRAVENOUS | Status: AC
  Administered 2023-07-20: 1000 mL via INTRAVENOUS

## 2023-07-20 MED ORDER — INSULIN ASPART 100 UNIT/ML IJ SOLN
0.0000 [IU] | Freq: Three times a day (TID) | INTRAMUSCULAR | Status: DC
  Administered 2023-07-21 – 2023-07-22 (×4): 2 [IU] via SUBCUTANEOUS
  Administered 2023-07-22 – 2023-07-23 (×2): 3 [IU] via SUBCUTANEOUS

## 2023-07-20 MED ORDER — ACETAMINOPHEN 325 MG PO TABS: 650.0000 mg | ORAL_TABLET | Freq: Four times a day (QID) | ORAL | Status: DC | PRN

## 2023-07-20 MED ORDER — HEPARIN SODIUM (PORCINE) 5000 UNIT/ML IJ SOLN
5000.0000 [IU] | Freq: Three times a day (TID) | INTRAMUSCULAR | Status: DC
  Administered 2023-07-20 – 2023-07-23 (×8): 5000 [IU] via SUBCUTANEOUS
  Filled 2023-07-20 (×8): qty 1

## 2023-07-20 MED ORDER — PANTOPRAZOLE SODIUM 40 MG PO TBEC
40.0000 mg | DELAYED_RELEASE_TABLET | Freq: Every day | ORAL | Status: DC
  Administered 2023-07-21 – 2023-07-23 (×3): 40 mg via ORAL
  Filled 2023-07-20 (×3): qty 1

## 2023-07-20 MED ORDER — PREGABALIN 75 MG PO CAPS
75.0000 mg | ORAL_CAPSULE | Freq: Two times a day (BID) | ORAL | Status: DC
  Administered 2023-07-20 – 2023-07-23 (×5): 75 mg via ORAL
  Filled 2023-07-20 (×5): qty 1

## 2023-07-20 MED ORDER — METOPROLOL TARTRATE 25 MG PO TABS
25.0000 mg | ORAL_TABLET | Freq: Two times a day (BID) | ORAL | Status: DC
  Administered 2023-07-20 – 2023-07-23 (×4): 25 mg via ORAL
  Filled 2023-07-20 (×5): qty 1

## 2023-07-20 MED ORDER — PROCHLORPERAZINE EDISYLATE 10 MG/2ML IJ SOLN: 5.0000 mg | Freq: Four times a day (QID) | INTRAMUSCULAR | Status: DC | PRN

## 2023-07-20 MED ORDER — VANCOMYCIN HCL 2000 MG/400ML IV SOLN
2000.0000 mg | Freq: Once | INTRAVENOUS | Status: AC
  Administered 2023-07-20: 2000 mg via INTRAVENOUS
  Filled 2023-07-20: qty 400

## 2023-07-20 MED ORDER — ISOSORBIDE MONONITRATE ER 30 MG PO TB24
15.0000 mg | ORAL_TABLET | Freq: Every day | ORAL | Status: DC
  Administered 2023-07-20 – 2023-07-23 (×4): 15 mg via ORAL
  Filled 2023-07-20 (×4): qty 1

## 2023-07-20 MED ORDER — SODIUM CHLORIDE 0.9 % IV SOLN: INTRAVENOUS | Status: AC

## 2023-07-20 MED ORDER — VANCOMYCIN HCL 750 MG/150ML IV SOLN: 750.0000 mg | INTRAVENOUS | Status: DC

## 2023-07-20 MED ORDER — ROSUVASTATIN CALCIUM 20 MG PO TABS
40.0000 mg | ORAL_TABLET | Freq: Every evening | ORAL | Status: DC
  Administered 2023-07-20 – 2023-07-22 (×3): 40 mg via ORAL
  Filled 2023-07-20 (×3): qty 2

## 2023-07-20 NOTE — ED Triage Notes (Signed)
Pt. Stated, I was sent here from Dr. Ardelle Anton Foot and ankle sauce Im probably loose my left big toe infected

## 2023-07-20 NOTE — Progress Notes (Deleted)
  Cardiology Office Note:  .   Date:  07/20/2023  ID:  [name], DOB 19-Dec-1960, MRN [id number] PCP: Raymon Mutton., FNP  [facility] Providers Cardiologist:  Tonny Bollman, MD Cardiology APP:  Beatrice Lecher, PA-C { Click to update primary MD,subspecialty MD or APP then REFRESH:1}   History of Present Illness: .   [name] is a 63 y.o. female  with history of CAD, Status post non-STEMI in November 2019.  She has diffuse small vessel and distal vessel disease which has been managed medically.  Non-ischemic cardiomyopathy. EF 45-50%,Hyperlipidemia, HTN, DM2.       ROS: ***  Studies Reviewed: Marland Kitchen         Prior CV Studies: {Select studies to display:26339}  ***  Risk Assessment/Calculations:   {Does this patient have ATRIAL FIBRILLATION?:[phone]}         Physical Exam:   VS:  LMP 02/13/2011    Wt Readings from Last 3 Encounters:  07/12/23 235 lb (106.6 kg)  04/03/23 239 lb (108.4 kg)  08/19/22 239 lb (108.4 kg)    GEN: Well nourished, well developed in no acute distress NECK: No JVD; No carotid bruits CARDIAC: ***RRR, no murmurs, rubs, gallops RESPIRATORY:  Clear to auscultation without rales, wheezing or rhonchi  ABDOMEN: Soft, non-tender, non-distended EXTREMITIES:  No edema; No deformity   ASSESSMENT AND PLAN: .    CAD (coronary artery disease) Status post non-STEMI in November 2019.  She has diffuse small vessel and distal vessel disease which has been managed medically. She is doing well w/o angina on her current regimen. Continue Plavix 75 mg once daily, Imdur 60 mg once daily, Ranexa 500 mg twice daily, Crestor 40 mg once daily. Her HR tends to run high and  Metoprolol Tartrate increased LOV to 100 mg twice daily. F/u in 6 mos.   HFmrEF (heart failure with mildly reduced ejection fraction) (HCC) Non-ischemic cardiomyopathy. EF 45-50%. Volume status stable. NYHA II-IIb. Continue Jardiance 25 mg once daily (higher dose for diabetes), Imdur  60 mg once daily, Lisinopril 2.5 mg once daily, Metoprolol Tartrate to 100 mg twice daily as noted.       Hyperlipidemia LDL optimal on most recent lab work.  Continue current Rx.     Essential hypertension BP controlled. Adjust Metoprolol dose for coronary artery disease, congestive heart failure. Continue Imdur 60 mg once daily, Lisinopril 2.5 mg once daily.    Type 2 diabetes mellitus with retinopathy, with long-term current use of insulin (HCC) Managed by endocrinology.       {Are you ordering a CV Procedure (e.g. stress test, cath, DCCV, TEE, etc)?   Press F2        :[id number]}  Dispo: ***  Signed, Jacolyn Reedy, PA-C

## 2023-07-20 NOTE — ED Notes (Signed)
CMP resent to lab.

## 2023-07-20 NOTE — Telephone Encounter (Signed)
Called and spoke with [name] - Advised that patient is on her way to the ED -  cellulitis and abscess left big toe, known osteomyelitis. BP and pulse are very elevated -concerned that she is septic. thanks

## 2023-07-20 NOTE — Progress Notes (Signed)
ED Pharmacy Antibiotic Sign Off An antibiotic consult was received from an ED provider for vancomycin per pharmacy dosing for  wound infection . A chart review was completed to assess appropriateness.  A single dose of cefepime and flagyl placed by the ED provider.   The following one time order(s) were placed per pharmacy consult:  vancomycin 2000 mg x 1 dose  Further antibiotic and/or antibiotic pharmacy consults should be ordered by the admitting provider if indicated.   Thank you for allowing pharmacy to be a part of this patient's care.   Delmar Landau, PharmD, BCPS 07/20/2023 5:53 PM ED Clinical Pharmacist -  [phone]

## 2023-07-20 NOTE — Progress Notes (Signed)
Subjective: Chief Complaint  Patient presents with   Foot Ulcer    RM#11 Left big toe infection X 1 week states missed last appt . Patient states feeling sick.     63 year old female presents today for evaluation of left big toe ulcer.  She states that she has an appointment however given insurance issues she was not able to come in.  She states that recently she started having fevers chills and overall not feeling well.  Her blood pressure is also a bit high.  A1c 10.2 on 07/12/2023  Objective: AAO x3, NAD DP/PT pulses palpable bilaterally, CRT less than 3 seconds Full-thickness ulceration to the left hallux with clear drainage noted.  There is edema to the foot.  See picture below.  There is no crepitation today. Third toe amputation is healed. No pain with calf compression, swelling, warmth, erythema        Assessment: Ulceration left hallux, infection, uncontrolled diabetes   Plan: -All treatment options discussed with the patient including all alternatives, risks, complications.  -X-rays obtained reviewed.  There is cortical changes along the proximal phalanx head consistent with osteomyelitis.  Edema present. -Overall she has not been feeling good and her blood pressure is high today as well as her pulse.  Given the infection and her vital signs I recommend going to emergency room for further evaluation and likely admission.  I discussed with her she is going to need to proceed with amputation of the remainder of the toe.  Patient's daughter brought her to the office.  She is negative to the emergency room. -We have contacted [facility] ER triage and I have informed Dr. Levester Fresh who is covering the hospital today for [place].  -Would benefit from new ABIs  -Previous ABI was WNL on 08/16/2021- She has healed the other amputations and ulcers. If she does not have significant healing next appointment will check a new ABI.   Vivi Barrack DPM

## 2023-07-20 NOTE — Inpatient Diabetes Management (Signed)
Inpatient Diabetes Program Recommendations  AACE/ADA: New Consensus Statement on Inpatient Glycemic Control (2015)  Target Ranges:  Prepandial:   less than 140 mg/dL      Peak postprandial:   less than 180 mg/dL (1-2 hours)      Critically ill patients:  140 - 180 mg/dL   Lab Results  Component Value Date   GLUCAP 225 (H) 05/24/2018   HGBA1C 10.2 (A) 07/12/2023    Review of Glycemic Control  Diabetes history: DM 2 Outpatient Diabetes medications: Jardiance 25 mg Daily, Tresiba 44 units Daily, Metformin 1000 mg Daily, Novolog SSI, Ozempic 2 mg Weekly Current orders for Inpatient glycemic control:  Being evaluated in the ED  Endocrinologist: Dr. Lonzo Cloud last visit 1/15 pt to be starting on insulin pump soon for glucose control A1c 10.2% on 1/15  Inpatient Diabetes Program Recommendations:    -   Semglee 15 units bid -   Novolog 0-15 units tid + hs  Thanks,  Christena Deem RN, MSN, BC-ADM Inpatient Diabetes Coordinator Team Pager [phone] (8a-5p)

## 2023-07-20 NOTE — ED Notes (Signed)
Patient transported to Ultrasound 

## 2023-07-20 NOTE — Plan of Care (Signed)
Saw patient in MRI waiting prior to going for study.  Discussed my concern for bone infection in remainder of left great toe.  Recommend MRI to confirm with plans for toe amputation tomorrow around 1200.   She is in agreement to proceed.   Full consult to follow in pre op tmrw. NPO past MN, OR tomorrow L hallux amp at MPJ level Ok to hold abx pre op if not septic Ok to continue anticoagulation pre op Obtain ABI PVR studies today. If grossly abnormal will need vascular consultation prior to procedure. WBAT LLE        Corinna Gab, DPM [facility] / [facility]                   07/20/2023

## 2023-07-20 NOTE — Progress Notes (Signed)
Pharmacy Antibiotic Note  [name] is a 63 y.o. female for which pharmacy has been consulted for vancomycin dosing for  wound infection .  Patient with a history of T2DM, HTN, HLD, diabetic polyneuropathy, GERD. Patient presenting from podiatrist office with c/f lt great toe osteomyelitis.  Plan: Received vancomycin, flagyl, and cefepime in the ED Podiatry would like to hold further abx until post-op on 1/24 (if further abx are needed at that time). Pharmacy to follow.  Recommended dosing if continued 1/24 Cefepime 2g8hr Flagyl 500mg  q12h Vancomycin 1500 mg q24h (eAUC 526.4; Scr 0.9; Vd 0.5) Monitor WBC, fever, renal function, cultures  Height: 5\' 6"  (167.6 cm) Weight: 106.6 kg (235 lb) IBW/kg (Calculated) : 59.3  Temp (24hrs), Avg:98.3 F (36.8 C), Min:97.9 F (36.6 C), Max:98.6 F (37 C)  Recent Labs  Lab 07/14/23 0814 07/20/23 1820 07/20/23 1956  WBC  --  12.7*  --   CREATININE 1.36*  --  0.90    Estimated Creatinine Clearance: 78 mL/min (by C-G formula based on SCr of 0.9 mg/dL).    No Known Allergies  Microbiology results: Pending  Thank you for allowing pharmacy to be a part of this patient's care.  Delmar Landau, PharmD, BCPS 07/20/2023 9:11 PM ED Clinical Pharmacist -  [phone]

## 2023-07-20 NOTE — ED Provider Notes (Signed)
[place] EMERGENCY DEPARTMENT AT [name] Provider Note  CSN: [id number] Arrival date & time: 07/20/23 1016  Chief Complaint(s) Osteomyelitis   HPI [name] is a 63 y.o. female history of coronary artery disease, CHF, diabetes, hypertension, hyperlipidemia presenting to the emergency department with left big toe wound.  She reports that she has had toe wound for around a month or so.  Has been worsening.  Lately she has been having some subjective fevers and chills as well as increased pain.  She saw her podiatrist today who referred her to the emergency department for possible amputation.  No chest pain, belly pain, back pain, headaches, nausea or vomiting, other new symptoms.   Past Medical History Past Medical History:  Diagnosis Date   Adhesive capsulitis of shoulder    bilateral, Steroid injection Dr. Nedra Hai 1/12 bilaterally   CAD (coronary artery disease)    nonobstructive. Last cardiac cath (2008) showing left circumflex with mid 50% stenosis and distal luminal irregularities. Also with RCA with mid to distal 30-40% stenosis. // Previously evaluated by [facility], never followed up outpatient.   CAP (community acquired pneumonia) 06/15/2012   05/2012 CXR: Mild opacification of the posterior lung base on the lateral film  as cannot exclude infection/atelectasis.     CHF (congestive heart failure) (HCC)    Diabetes mellitus 2007   Type II, insulin dependent   Diabetic retinopathy    GERD (gastroesophageal reflux disease)    Glaucoma    Hyperlipidemia    Hypertension    Obesity    Peripheral neuropathy    Patient Active Problem List   Diagnosis Date Noted   Osteomyelitis (HCC) 07/20/2023   Amputation of left great toe (HCC) 02/24/2023   Long term current use of oral hypoglycemic drug 02/24/2023   Long-term (current) use of injectable non-insulin antidiabetic drugs 02/24/2023   Sinus tachycardia 11/16/2021   Diabetes mellitus (HCC) 10/07/2019    Type 2 diabetes mellitus with diabetic polyneuropathy, with long-term current use of insulin (HCC) 10/07/2019   Type 2 diabetes mellitus with retinopathy, with long-term current use of insulin (HCC) 10/07/2019   Dyslipidemia 10/07/2019   Old MI (myocardial infarction)    Shortness of breath 05/21/2018   Chronic lower back pain 02/21/2018   Chronic pain of right knee 01/23/2018   Cough 08/23/2017   Rib pain on right side 06/28/2017   Lateral pain of right hip 04/27/2017   Right foot ulcer (HCC) 10/26/2016   Constipation 09/30/2016   History of colonic polyps 09/30/2016   Right foot pain 07/08/2016   Non-alcoholic fatty liver disease 05/13/2016   Spinal stenosis in cervical region 09/29/2015   Bilateral low back pain with sciatica    Long-term current use of opiate analgesic 07/11/2014   Onychomycosis 10/29/2013   Severe nonproliferative diabetic retinopathy (HCC) 04/04/2013   Primary open angle glaucoma 04/04/2013   Breast cancer screening 12/05/2012   Pain and swelling of left knee 10/29/2012   Insomnia 06/15/2012   HFmrEF (heart failure with mildly reduced ejection fraction) (HCC) 01/30/2012   CAD (coronary artery disease) 11/02/2011   Preventative health care 07/13/2011   Adhesive capsulitis of right shoulder 11/16/2010   Gastroesophageal reflux disease 10/12/2009   Uncontrolled type 2 diabetes with neuropathy 06/12/2006   Hyperlipidemia 06/12/2006   OBESITY 06/12/2006   Essential hypertension 06/12/2006   Home Medication(s) Prior to Admission medications   Medication Sig Start Date End Date Taking? Authorizing Provider  Alcohol Swabs (B-D SINGLE USE SWABS REGULAR) PADS Use  as directed 06/30/21   Shamleffer, Konrad Dolores, MD  amitriptyline (ELAVIL) 25 MG tablet Take 1 tablet (25 mg total) by mouth at bedtime. 01/10/18   Tyson Alias, MD  ammonium lactate (AMLACTIN) 12 % cream Apply 1 Application topically as needed for dry skin. 01/26/23   Vivi Barrack, DPM   azelastine (ASTELIN) 0.1 % nasal spray Place 1 spray into both nostrils 2 (two) times daily.    [provider]  benzonatate (TESSALON) 200 MG capsule Take 1 capsule (200 mg total) by mouth 3 (three) times daily as needed for cough. 02/14/23   Bing Neighbors, NP  Blood Glucose Calibration (TRUE METRIX LEVEL 1) Low SOLN Use as directed 06/30/21   Shamleffer, Konrad Dolores, MD  Blood Glucose Monitoring Suppl (GNP TRUE METRIX AIR METER) w/Device KIT 1 kit by Does not apply route 4 (four) times daily. E11.42 06/30/21   Shamleffer, Konrad Dolores, MD  clopidogrel (PLAVIX) 75 MG tablet Take 1 tablet (75 mg total) by mouth daily. 08/21/18   Alben Spittle, Scott T, PA-C  COMBIGAN 0.2-0.5 % ophthalmic solution Place 1 drop into both eyes 2 (two) times daily. 03/22/18   [provider]  Continuous Blood Gluc Receiver (DEXCOM G7 RECEIVER) DEVI Use to check blood sugars 07/20/22   Shamleffer, Konrad Dolores, MD  Continuous Blood Gluc Transmit (DEXCOM G6 TRANSMITTER) MISC 1 Device by Does not apply route as directed. 10/04/21   Shamleffer, Konrad Dolores, MD  Continuous Glucose Sensor (DEXCOM G7 SENSOR) MISC Change sensor every 10 days 03/24/23   Shamleffer, Konrad Dolores, MD  diphenhydrAMINE (BENADRYL) 25 MG tablet Take 1 tablet (25 mg total) by mouth every 6 (six) hours as needed for itching or allergies. 06/19/18   Fawze, Mina A, PA-C  dorzolamide (TRUSOPT) 2 % ophthalmic solution Place 1 drop into both eyes 2 (two) times daily. 03/22/18   [provider]  doxycycline (VIBRA-TABS) 100 MG tablet Take 1 tablet (100 mg total) by mouth 2 (two) times daily. 06/09/23   Vivi Barrack, DPM  empagliflozin (JARDIANCE) 25 MG TABS tablet Take 1 tablet (25 mg total) by mouth daily. 02/24/23   Shamleffer, Konrad Dolores, MD  fluticasone (FLONASE) 50 MCG/ACT nasal spray Place 1 spray into both nostrils daily. 11/10/17 07/20/23  Fuller Plan, MD  furosemide (LASIX) 20 MG tablet Take 20 mg by  mouth 2 (two) times daily.    [provider]  glucose blood (GNP TRUETRACK TEST STRIPS) test strip Use as instructed 07/07/21   Shamleffer, Konrad Dolores, MD  glucose blood (ONETOUCH VERIO) test strip USE TO CHECK BLOOD SUGAR 4 TIMES DAILY AS DIRECTED 01/30/18   Tyson Alias, MD  glucose blood (TRUE METRIX BLOOD GLUCOSE TEST) test strip Use as instructed 07/08/21   Shamleffer, Konrad Dolores, MD  Guaifenesin (MUCINEX MAXIMUM STRENGTH) 1200 MG TB12 Take 1 tablet (1,200 mg total) by mouth every 12 (twelve) hours as needed. 02/14/23   Bing Neighbors, NP  HYDROcodone-acetaminophen (NORCO/VICODIN) 5-325 MG tablet Take 1-2 tablets by mouth every 6 (six) hours as needed. 03/29/23   Vivi Barrack, DPM  insulin degludec (TRESIBA FLEXTOUCH) 100 UNIT/ML FlexTouch Pen Inject 44 Units into the skin daily. 02/24/23   Shamleffer, Konrad Dolores, MD  insulin lispro (HUMALOG KWIKPEN) 100 UNIT/ML KwikPen Max daily 60 units 02/24/23   Shamleffer, Konrad Dolores, MD  Insulin Pen Needle 31G X 5 MM MISC 1 Device by Does not apply route in the morning, at noon, in the evening, and at bedtime. 02/24/23  Shamleffer, Konrad Dolores, MD  isosorbide mononitrate (IMDUR) 60 MG 24 hr tablet TAKE 1 TABLET EVERY DAY 12/10/20   Tonny Bollman, MD  lisinopril (ZESTRIL) 2.5 MG tablet TAKE 1 TABLET(2.5 MG) BY MOUTH DAILY 02/23/23   Tonny Bollman, MD  metFORMIN (GLUCOPHAGE) 1000 MG tablet Take 1 tablet (1,000 mg total) by mouth daily with breakfast. 02/24/23   Shamleffer, Konrad Dolores, MD  metoprolol tartrate (LOPRESSOR) 100 MG tablet Take 1 tablet (100 mg total) by mouth 2 (two) times daily. 05/11/22   Tonny Bollman, MD  montelukast (SINGULAIR) 10 MG tablet Take 10 mg by mouth at bedtime.    [provider]  mupirocin ointment (BACTROBAN) 2 % Apply 1 Application topically 2 (two) times daily. 11/10/22   Vivi Barrack, DPM  mupirocin ointment (BACTROBAN) 2 % Apply 1 Application topically 2  (two) times daily. 05/30/23   Vivi Barrack, DPM  Netarsudil-Latanoprost (ROCKLATAN) 0.02-0.005 % SOLN Place 1 drop into both eyes daily.    [provider]  nitroGLYCERIN (NITROSTAT) 0.4 MG SL tablet DISSOLVE 1 TABLET (0.4 MG TOTAL) UNDER THE TONGUE EVERY 5 MINUTES AS NEEDED FOR CHEST PAIN. Patient taking differently: Place 0.4 mg under the tongue every 5 (five) minutes as needed for chest pain. 12/10/20   Tonny Bollman, MD  [facility] DELICA LANCETS FINE MISC Check blood sugar as instructed up to 3 times a day 03/14/16   Fuller Plan, MD  oxyCODONE-acetaminophen (PERCOCET/ROXICET) 5-325 MG tablet Take 1 tablet by mouth every 8 (eight) hours as needed. 07/03/23   [provider]  pantoprazole (PROTONIX) 40 MG tablet Take 1 tablet (40 mg total) by mouth daily. 03/08/18   Yvette Rack, MD  pregabalin (LYRICA) 75 MG capsule Take 1 capsule (75 mg total) by mouth 2 (two) times daily. 06/09/23   Vivi Barrack, DPM  promethazine (PHENERGAN) 25 MG tablet Take 1 tablet (25 mg total) by mouth every 8 (eight) hours as needed for nausea or vomiting. 11/23/22   Vivi Barrack, DPM  promethazine (PHENERGAN) 25 MG tablet Take 1 tablet (25 mg total) by mouth every 8 (eight) hours as needed for nausea or vomiting. 03/29/23   Vivi Barrack, DPM  ranolazine (RANEXA) 500 MG 12 hr tablet Take 1 tablet (500 mg total) by mouth 2 (two) times daily. 11/16/21   Tereso Newcomer T, PA-C  rosuvastatin (CRESTOR) 40 MG tablet TAKE 1 TABLET(40 MG) BY MOUTH DAILY Patient taking differently: Take 40 mg by mouth daily. 10/04/22   Tereso Newcomer T, PA-C  SANTYL ointment Apply 1 application topically daily as needed (foot).  08/22/18   [provider]  Semaglutide, 2 MG/DOSE, 8 MG/3ML SOPN Inject 2 mg as directed once a week. 02/24/23   Shamleffer, Konrad Dolores, MD  silver sulfADIAZINE (SILVADENE) 1 % cream Apply 1 Application topically daily. 02/28/23   Vivi Barrack, DPM  TRUEplus Lancets  33G MISC Check 4 times a daily 06/30/21   Shamleffer, Konrad Dolores, MD  Past Surgical History Past Surgical History:  Procedure Laterality Date   AMPUTATION TOE Left 03/2023   3rd left toe   ANTERIOR CERVICAL DECOMP/DISCECTOMY FUSION N/A 06/16/2016   Procedure: Anterior Cervical Decompression/discectomy Fusion - Cervical six - Cervical seven;  Surgeon: Tia Alert, MD;  Location: [facility] OR;  Service: Neurosurgery;  Laterality: N/A;  Anterior Cervical Decompression/discectomy Fusion - Cervical six - Cervical seven   APPENDECTOMY     CARDIAC CATHETERIZATION     CATARACT EXTRACTION     GLAUCOMA SURGERY     LEFT HEART CATH AND CORONARY ANGIOGRAPHY N/A 05/22/2018   Procedure: LEFT HEART CATH AND CORONARY ANGIOGRAPHY;  Surgeon: Lennette Bihari, MD;  Location: [facility] INVASIVE CV LAB;  Service: Cardiovascular;  Laterality: N/A;   LEFT HEART CATHETERIZATION WITH CORONARY ANGIOGRAM N/A 01/24/2012   Procedure: LEFT HEART CATHETERIZATION WITH CORONARY ANGIOGRAM;  Surgeon: Tonny Bollman, MD;  Location: [name] CATH LAB;  Service: Cardiovascular;  Laterality: N/A;   POSTERIOR CERVICAL FUSION/FORAMINOTOMY N/A 10/22/2015   Procedure: Cervical three-Cervical Seven Posterior cervical fusion with lateral mass fixation,  Laminectomy Cervical Three-Cervical Seven;  Surgeon: Tia Alert, MD;  Location: [facility] NEURO ORS;  Service: Neurosurgery;  Laterality: N/A;  Posterior   TUBAL LIGATION     Family History Family History  Problem Relation Age of Onset   Heart disease Mother    Hypertension Sister    Hypertension Sister    Hypertension Sister    Heart attack Neg Hx    Stroke Neg Hx     Social History Social History   Tobacco Use   Smoking status: Former    Current packs/day: 0.00    Average packs/day: 0.3 packs/day for 7.0 years (2.1 ttl pk-yrs)    Types: Cigarettes    Start date:  06/28/1999    Quit date: 06/27/2006    Years since quitting: 17.0   Smokeless tobacco: Never  Vaping Use   Vaping status: Never Used  Substance Use Topics   Alcohol use: No    Alcohol/week: 0.0 standard drinks of alcohol   Drug use: No   Allergies Patient has no known allergies.  Review of Systems Review of Systems  All other systems reviewed and are negative.   Physical Exam Vital Signs  I have reviewed the triage vital signs BP (!) 161/77 (BP Location: Right Arm)   Pulse 93   Temp 98.2 F (36.8 C) (Oral)   Resp 16   Ht 5\' 6"  (1.676 m)   Wt 106.6 kg   LMP 02/13/2011   SpO2 97%   BMI 37.93 kg/m  Physical Exam Vitals and nursing note reviewed.  Constitutional:      General: She is not in acute distress.    Appearance: She is well-developed. She is obese.  HENT:     Head: Normocephalic and atraumatic.     Mouth/Throat:     Mouth: Mucous membranes are moist.  Eyes:     Pupils: Pupils are equal, round, and reactive to light.  Cardiovascular:     Rate and Rhythm: Normal rate and regular rhythm.     Heart sounds: No murmur heard. Pulmonary:     Effort: Pulmonary effort is normal. No respiratory distress.     Breath sounds: Normal breath sounds.  Abdominal:     General: Abdomen is flat.     Palpations: Abdomen is soft.     Tenderness: There is no abdominal tenderness.  Musculoskeletal:        General: No tenderness.  Right lower leg: No edema.     Left lower leg: No edema.  Skin:    Comments: Wound present to the inferior lateral aspect of the left great toe hallux with tenderness.  Neurological:     General: No focal deficit present.     Mental Status: She is alert. Mental status is at baseline.  Psychiatric:        Mood and Affect: Mood normal.        Behavior: Behavior normal.     ED Results and Treatments Labs (all labs ordered are listed, but only abnormal results are displayed) Labs Reviewed  CBC WITH DIFFERENTIAL/PLATELET - Abnormal; Notable for  the following components:      Result Value   WBC 12.7 (*)    RDW 15.8 (*)    Neutro Abs 8.0 (*)    All other components within normal limits  SEDIMENTATION RATE - Abnormal; Notable for the following components:   Sed Rate 63 (*)    All other components within normal limits  C-REACTIVE PROTEIN - Abnormal; Notable for the following components:   CRP 2.1 (*)    All other components within normal limits  I-STAT CHEM 8, ED - Abnormal; Notable for the following components:   BUN 24 (*)    Glucose, Bld 158 (*)    Calcium, Ion 1.07 (*)    All other components within normal limits  CULTURE, BLOOD (ROUTINE X 2)  CULTURE, BLOOD (ROUTINE X 2)  COMPREHENSIVE METABOLIC PANEL                                                                                                                          Radiology VAS [place] ABI WITH/WO TBI Result Date: 07/20/2023  LOWER EXTREMITY DOPPLER STUDY Patient Name:  LAKSHYA CHHOEUN  Date of Exam:   07/20/2023 Medical Rec #: [id number]         Accession #:    [id number] Date of Birth: Sep 23, 1960          Patient Gender: F Patient Age:   4 years Exam Location:  [facility] Procedure:      VAS [place] ABI WITH/WO TBI Referring Phys: Lyn Hollingshead STANDIFORD --------------------------------------------------------------------------------  Indications: Ulceration, and gangrene. High Risk Factors: Hypertension, hyperlipidemia, Diabetes, past history of                    smoking, prior MI, coronary artery disease. Other Factors: CHF.  Comparison Study: Previous exam was on 01/22/2020 Performing Technologist: Ernestene Mention RVT, RDMS  Examination Guidelines: A complete evaluation includes at minimum, Doppler waveform signals and systolic blood pressure reading at the level of bilateral brachial, anterior tibial, and posterior tibial arteries, when vessel segments are accessible. Bilateral testing is considered an integral part of a complete examination. Photoelectric Plethysmograph (PPG)  waveforms and toe systolic pressure readings are included as required and additional duplex testing as needed. Limited examinations for reoccurring indications may be performed as noted.  ABI  Findings: +---------+------------------+-----+---------+--------+ Right    Rt Pressure (mmHg)IndexWaveform Comment  +---------+------------------+-----+---------+--------+ Brachial 177                    triphasic         +---------+------------------+-----+---------+--------+ PTA      186               1.05 triphasic         +---------+------------------+-----+---------+--------+ DP       209               1.18 triphasic         +---------+------------------+-----+---------+--------+ Great Toe188               1.06 Normal            +---------+------------------+-----+---------+--------+ +---------+------------------+-----+---------+-------------------+ Left     Lt Pressure (mmHg)IndexWaveform Comment             +---------+------------------+-----+---------+-------------------+ Brachial 167                    triphasic                    +---------+------------------+-----+---------+-------------------+ PTA      191               1.08 triphasic                    +---------+------------------+-----+---------+-------------------+ DP       191               1.08 triphasic                    +---------+------------------+-----+---------+-------------------+ Great Toe                                bandaged/open wound +---------+------------------+-----+---------+-------------------+ +-------+-----------+-----------+------------+------------+ ABI/TBIToday's ABIToday's TBIPrevious ABIPrevious TBI +-------+-----------+-----------+------------+------------+ Right  1.18       1.06       1.01        0.82         +-------+-----------+-----------+------------+------------+ Left   1.08                  0.96        0.93          +-------+-----------+-----------+------------+------------+  Unable to obtain TBI of LLE due to open wound and bandage.  Summary: Right: Resting right ankle-brachial index is within normal range. The right toe-brachial index is normal. Left: Resting left ankle-brachial index is within normal range. *See table(s) above for measurements and observations.     Preliminary    MR FOOT LEFT WO CONTRAST Result Date: 07/20/2023 CLINICAL DATA:  Left great toe infection. Prior peripheral toe amputations. EXAM: MRI OF THE LEFT FOOT WITHOUT CONTRAST TECHNIQUE: Multiplanar, multisequence MR imaging of the left forefoot was performed. No intravenous contrast was administered. COMPARISON:  Left foot x-rays from same day. MRI left foot dated Nov 06, 2022. FINDINGS: Bones/Joint/Cartilage Prominent marrow edema with corresponding decreased T1 marrow signal involving the majority of the first proximal phalanx. Prior amputation of the first distal phalanx. Prior partial amputation of the second and third toes. Focal marrow edema in the residual distal third middle phalanx (series 8, image 19). No fracture or dislocation. Mild first MTP joint degenerative changes. No joint effusion. Ligaments Collateral ligaments are intact.  Lisfranc ligament is intact. Muscles and Tendons Flexor and extensor tendons are intact.  No tenosynovitis. Increased T2 signal within and atrophy of the intrinsic muscles of the forefoot, nonspecific, but likely related to diabetic muscle changes. Soft tissue Soft tissue ulceration at the first amputation stump. No fluid collection. Soft tissue swelling at the tip of the residual third toe. No soft tissue mass. IMPRESSION: 1. Soft tissue ulceration at the first amputation stump with acute osteomyelitis of the first proximal phalanx. No abscess. 2. Prior partial amputation of the second and third toes. Focal marrow edema in the residual distal third middle phalanx with overlying soft tissue swelling, suspicious  for early osteomyelitis. Correlate for ulcer on physical exam. Electronically Signed   By: Obie Dredge M.D.   On: 07/20/2023 13:12    Pertinent labs & imaging results that were available during my care of the patient were reviewed by me and considered in my medical decision making (see MDM for details).  Medications Ordered in ED Medications  ceFEPIme (MAXIPIME) 2 g in sodium chloride 0.9 % 100 mL IVPB (2 g Intravenous New Bag/Given 07/20/23 1838)  metroNIDAZOLE (FLAGYL) IVPB 500 mg (0 mg Intravenous Stopped 07/20/23 1951)  HYDROmorphone (DILAUDID) injection 0.5 mg (0.5 mg Intravenous Given 07/20/23 1829)  lactated ringers bolus 1,000 mL (1,000 mLs Intravenous New Bag/Given 07/20/23 1834)  vancomycin (VANCOREADY) IVPB 2000 mg/400 mL (2,000 mg Intravenous New Bag/Given 07/20/23 1832)                                                                                                                                     Procedures Procedures  (including critical care time)  Medical Decision Making / ED Course   MDM:  63 year old presenting to the emergency department with wound to the foot.  Patient is overall well-appearing, no fever or tachycardia.  Exam does show a wound to the left hallux.  This is consistent with diabetic foot ulcer.  She was sent by her podiatrist for admission for possible amputation.  MRI does confirm osteomyelitis.  She will be covered broadly with antibiotics.  Will laboratory testing and blood cultures given her subjective fevers and chills.  Patient will need to be admitted for further management.  Clinical Course as of 07/20/23 2035  Thu Jul 20, 2023  2033 Discussed with Dr. Margo Aye who has admitted patient  [WS]    Clinical Course User Index [WS] Lonell Grandchild, MD     Additional history obtained: -External records from outside source obtained and reviewed including: Chart review including previous notes, labs, imaging, consultation notes including  hospitalist   Lab Tests: -I ordered, reviewed, and interpreted labs.   The pertinent results include:   Labs Reviewed  CBC WITH DIFFERENTIAL/PLATELET - Abnormal; Notable for the following components:      Result Value   WBC 12.7 (*)    RDW 15.8 (*)    Neutro Abs 8.0 (*)    All other components within normal limits  SEDIMENTATION RATE - Abnormal;  Notable for the following components:   Sed Rate 63 (*)    All other components within normal limits  C-REACTIVE PROTEIN - Abnormal; Notable for the following components:   CRP 2.1 (*)    All other components within normal limits  I-STAT CHEM 8, ED - Abnormal; Notable for the following components:   BUN 24 (*)    Glucose, Bld 158 (*)    Calcium, Ion 1.07 (*)    All other components within normal limits  CULTURE, BLOOD (ROUTINE X 2)  CULTURE, BLOOD (ROUTINE X 2)  COMPREHENSIVE METABOLIC PANEL    Notable for mild leukocytosis, elevated ESR and CRP   Imaging Studies ordered: I ordered imaging studies including MRI foot On my interpretation imaging demonstrates osteomyelitis  I independently visualized and interpreted imaging. I agree with the radiologist interpretation   Medicines ordered and prescription drug management: Meds ordered this encounter  Medications   ceFEPIme (MAXIPIME) 2 g in sodium chloride 0.9 % 100 mL IVPB    Antibiotic Indication::   Osteomyelitis   metroNIDAZOLE (FLAGYL) IVPB 500 mg    Antibiotic Indication::   Other Indication (list below)   HYDROmorphone (DILAUDID) injection 0.5 mg   lactated ringers bolus 1,000 mL   vancomycin (VANCOREADY) IVPB 2000 mg/400 mL    Indication::   Wound Infection    -I have reviewed the patients home medicines and have made adjustments as needed   Social Determinants of Health:  Diagnosis or treatment significantly limited by social determinants of health: obesity   Reevaluation: After the interventions noted above, I reevaluated the patient and found that their  symptoms have improved  Co morbidities that complicate the patient evaluation  Past Medical History:  Diagnosis Date   Adhesive capsulitis of shoulder    bilateral, Steroid injection Dr. Nedra Hai 1/12 bilaterally   CAD (coronary artery disease)    nonobstructive. Last cardiac cath (2008) showing left circumflex with mid 50% stenosis and distal luminal irregularities. Also with RCA with mid to distal 30-40% stenosis. // Previously evaluated by [facility], never followed up outpatient.   CAP (community acquired pneumonia) 06/15/2012   05/2012 CXR: Mild opacification of the posterior lung base on the lateral film  as cannot exclude infection/atelectasis.     CHF (congestive heart failure) (HCC)    Diabetes mellitus 2007   Type II, insulin dependent   Diabetic retinopathy    GERD (gastroesophageal reflux disease)    Glaucoma    Hyperlipidemia    Hypertension    Obesity    Peripheral neuropathy       Dispostion: Disposition decision including need for hospitalization was considered, and patient admitted to the hospital.    Final Clinical Impression(s) / ED Diagnoses Final diagnoses:  Osteomyelitis, unspecified site, unspecified type [facility])     This chart was dictated using voice recognition software.  Despite best efforts to proofread,  errors can occur which can change the documentation meaning.    Lonell Grandchild, MD 07/20/23 2035

## 2023-07-20 NOTE — H&P (Signed)
History and Physical  [name] ZOX:[id number] DOB: January 01, 1961 DOA: 07/20/2023  Referring physician: Dr. Suezanne Jacquet, EDP  PCP: Raymon Mutton., FNP  Outpatient Specialists: Podiatry Patient coming from: Home through podiatrist office.  Chief Complaint: Left great toe wound   HPI: [name] is a 63 y.o. female with medical history significant for obesity, type 2 diabetes, hypertension, hyperlipidemia, diabetic polyneuropathy, GERD, who presented to the ER, sent from her podiatrist office due to concern for left great toe osteomyelitis.  The patient initially presented at her podiatrist's office today for evaluation of left big toe ulcer worsening for the past week.  Associated with subjective fevers and chills and overall not feeling well.  On exam she was noted to have full-thickness ulceration to the left hallux with clear drainage.  The patient had an x-ray done at the podiatrist office which showed findings suggestive of osteomyelitis.  She was advised to go to the ER for further evaluation.  In the ER, MRI left foot confirmed osteomyelitis of the left great toe.  Per podiatry, plan for amputation on 07/21/2023.  Recommended to hold off on any further antibiotics if the patient is hemodynamically stable.  Will benefit from a bone biopsy for ID and sensitivities.  Admitted by [facility], hospitalist service.  ED Course: Temperature 98.2.  BP 161/77, pulse 93, respiratory 16, saturation 97% on room air.  Lab studies notable for serum glucose 158, BUN 24, creatinine 0.90.  CRP 2.1.  Sed rate 63.  WBC 12.7.  Hemoglobin 13.3.  Review of Systems: Review of systems as noted in the HPI. All other systems reviewed and are negative.   Past Medical History:  Diagnosis Date   Adhesive capsulitis of shoulder    bilateral, Steroid injection Dr. Nedra Hai 1/12 bilaterally   CAD (coronary artery disease)    nonobstructive. Last cardiac cath (2008) showing left circumflex with mid 50% stenosis and distal  luminal irregularities. Also with RCA with mid to distal 30-40% stenosis. // Previously evaluated by [facility], never followed up outpatient.   CAP (community acquired pneumonia) 06/15/2012   05/2012 CXR: Mild opacification of the posterior lung base on the lateral film  as cannot exclude infection/atelectasis.     CHF (congestive heart failure) (HCC)    Diabetes mellitus 2007   Type II, insulin dependent   Diabetic retinopathy    GERD (gastroesophageal reflux disease)    Glaucoma    Hyperlipidemia    Hypertension    Obesity    Peripheral neuropathy    Past Surgical History:  Procedure Laterality Date   AMPUTATION TOE Left 03/2023   3rd left toe   ANTERIOR CERVICAL DECOMP/DISCECTOMY FUSION N/A 06/16/2016   Procedure: Anterior Cervical Decompression/discectomy Fusion - Cervical six - Cervical seven;  Surgeon: Tia Alert, MD;  Location: [facility] OR;  Service: Neurosurgery;  Laterality: N/A;  Anterior Cervical Decompression/discectomy Fusion - Cervical six - Cervical seven   APPENDECTOMY     CARDIAC CATHETERIZATION     CATARACT EXTRACTION     GLAUCOMA SURGERY     LEFT HEART CATH AND CORONARY ANGIOGRAPHY N/A 05/22/2018   Procedure: LEFT HEART CATH AND CORONARY ANGIOGRAPHY;  Surgeon: Lennette Bihari, MD;  Location: [facility] INVASIVE CV LAB;  Service: Cardiovascular;  Laterality: N/A;   LEFT HEART CATHETERIZATION WITH CORONARY ANGIOGRAM N/A 01/24/2012   Procedure: LEFT HEART CATHETERIZATION WITH CORONARY ANGIOGRAM;  Surgeon: Tonny Bollman, MD;  Location[facility] C[facility] LAB;  Service: Cardiovascular;  Laterality: N/A;   POSTERIOR CERVICAL FUSION/FORAMINOTOMY N/A  10/22/2015   Procedure: Cervical three-Cervical Seven Posterior cervical fusion with lateral mass fixation,  Laminectomy Cervical Three-Cervical Seven;  Surgeon: Tia Alert, MD;  Location: [facility] NEURO ORS;  Service: Neurosurgery;  Laterality: N/A;  Posterior   TUBAL LIGATION      Social History:  reports that she quit smoking about 17  years ago. Her smoking use included cigarettes. She started smoking about 24 years ago. She has a 2.1 pack-year smoking history. She has never used smokeless tobacco. She reports that she does not drink alcohol and does not use drugs.   No Known Allergies  Family History  Problem Relation Age of Onset   Heart disease Mother    Hypertension Sister    Hypertension Sister    Hypertension Sister    Heart attack Neg Hx    Stroke Neg Hx       Prior to Admission medications   Medication Sig Start Date End Date Taking? Authorizing Provider  amitriptyline (ELAVIL) 25 MG tablet Take 1 tablet (25 mg total) by mouth at bedtime. 01/10/18  Yes Tyson Alias, MD  ammonium lactate (AMLACTIN) 12 % cream Apply 1 Application topically as needed for dry skin. 01/26/23  Yes Vivi Barrack, DPM  azelastine (ASTELIN) 0.1 % nasal spray Place 1 spray into both nostrils daily as needed for allergies.   Yes [provider]  benzonatate (TESSALON) 200 MG capsule Take 1 capsule (200 mg total) by mouth 3 (three) times daily as needed for cough. 02/14/23  Yes Bing Neighbors, NP  clopidogrel (PLAVIX) 75 MG tablet Take 1 tablet (75 mg total) by mouth daily. Patient taking differently: Take 75 mg by mouth in the morning. 08/21/18  Yes Weaver, Scott T, PA-C  COMBIGAN 0.2-0.5 % ophthalmic solution Place 1 drop into both eyes 2 (two) times daily. 03/22/18  Yes [provider]  diphenhydrAMINE (BENADRYL) 25 MG tablet Take 1 tablet (25 mg total) by mouth every 6 (six) hours as needed for itching or allergies. 06/19/18  Yes Fawze, Mina A, PA-C  dorzolamide (TRUSOPT) 2 % ophthalmic solution Place 1 drop into both eyes 2 (two) times daily. 03/22/18  Yes [provider]  empagliflozin (JARDIANCE) 25 MG TABS tablet Take 1 tablet (25 mg total) by mouth daily. Patient taking differently: Take 25 mg by mouth in the morning. 02/24/23  Yes Shamleffer, Konrad Dolores, MD  fluticasone (FLONASE) 50  MCG/ACT nasal spray Place 1 spray into both nostrils daily. Patient taking differently: Place 1 spray into both nostrils daily as needed for allergies. 11/10/17 07/20/23 Yes Rice, Jamesetta Orleans, MD  furosemide (LASIX) 20 MG tablet Take 20 mg by mouth 2 (two) times daily.   Yes [provider]  Guaifenesin (MUCINEX MAXIMUM STRENGTH) 1200 MG TB12 Take 1 tablet (1,200 mg total) by mouth every 12 (twelve) hours as needed. 02/14/23  Yes Bing Neighbors, NP  Alcohol Swabs (B-D SINGLE USE SWABS REGULAR) PADS Use as directed 06/30/21   Shamleffer, Konrad Dolores, MD  Blood Glucose Calibration (TRUE METRIX LEVEL 1) Low SOLN Use as directed 06/30/21   Shamleffer, Konrad Dolores, MD  Blood Glucose Monitoring Suppl (GNP TRUE METRIX AIR METER) w/Device KIT 1 kit by Does not apply route 4 (four) times daily. E11.42 06/30/21   Shamleffer, Konrad Dolores, MD  Continuous Blood Gluc Receiver (DEXCOM G7 RECEIVER) DEVI Use to check blood sugars 07/20/22   Shamleffer, Konrad Dolores, MD  Continuous Blood Gluc Transmit (DEXCOM G6 TRANSMITTER) MISC 1 Device by Does not apply  route as directed. 10/04/21   Shamleffer, Konrad Dolores, MD  Continuous Glucose Sensor (DEXCOM G7 SENSOR) MISC Change sensor every 10 days 03/24/23   Shamleffer, Konrad Dolores, MD  doxycycline (VIBRA-TABS) 100 MG tablet Take 1 tablet (100 mg total) by mouth 2 (two) times daily. Patient not taking: Reported on 07/20/2023 06/09/23   Vivi Barrack, DPM  glucose blood (GNP TRUETRACK TEST STRIPS) test strip Use as instructed 07/07/21   Shamleffer, Konrad Dolores, MD  glucose blood (ONETOUCH VERIO) test strip USE TO CHECK BLOOD SUGAR 4 TIMES DAILY AS DIRECTED 01/30/18   Tyson Alias, MD  glucose blood (TRUE METRIX BLOOD GLUCOSE TEST) test strip Use as instructed 07/08/21   Shamleffer, Konrad Dolores, MD  HYDROcodone-acetaminophen (NORCO/VICODIN) 5-325 MG tablet Take 1-2 tablets by mouth every 6 (six) hours as needed. 03/29/23   Vivi Barrack, DPM  insulin degludec (TRESIBA FLEXTOUCH) 100 UNIT/ML FlexTouch Pen Inject 44 Units into the skin daily. 02/24/23   Shamleffer, Konrad Dolores, MD  insulin lispro (HUMALOG KWIKPEN) 100 UNIT/ML KwikPen Max daily 60 units 02/24/23   Shamleffer, Konrad Dolores, MD  Insulin Pen Needle 31G X 5 MM MISC 1 Device by Does not apply route in the morning, at noon, in the evening, and at bedtime. 02/24/23   Shamleffer, Konrad Dolores, MD  isosorbide mononitrate (IMDUR) 60 MG 24 hr tablet TAKE 1 TABLET EVERY DAY 12/10/20   Tonny Bollman, MD  lisinopril (ZESTRIL) 2.5 MG tablet TAKE 1 TABLET(2.5 MG) BY MOUTH DAILY 02/23/23   Tonny Bollman, MD  metFORMIN (GLUCOPHAGE) 1000 MG tablet Take 1 tablet (1,000 mg total) by mouth daily with breakfast. 02/24/23   Shamleffer, Konrad Dolores, MD  metoprolol tartrate (LOPRESSOR) 100 MG tablet Take 1 tablet (100 mg total) by mouth 2 (two) times daily. 05/11/22   Tonny Bollman, MD  montelukast (SINGULAIR) 10 MG tablet Take 10 mg by mouth at bedtime.    [provider]  mupirocin ointment (BACTROBAN) 2 % Apply 1 Application topically 2 (two) times daily. 11/10/22   Vivi Barrack, DPM  mupirocin ointment (BACTROBAN) 2 % Apply 1 Application topically 2 (two) times daily. 05/30/23   Vivi Barrack, DPM  Netarsudil-Latanoprost (ROCKLATAN) 0.02-0.005 % SOLN Place 1 drop into both eyes daily.    [provider]  nitroGLYCERIN (NITROSTAT) 0.4 MG SL tablet DISSOLVE 1 TABLET (0.4 MG TOTAL) UNDER THE TONGUE EVERY 5 MINUTES AS NEEDED FOR CHEST PAIN. Patient taking differently: Place 0.4 mg under the tongue every 5 (five) minutes as needed for chest pain. 12/10/20   Tonny Bollman, MD  [facility] DELICA LANCETS FINE MISC Check blood sugar as instructed up to 3 times a day 03/14/16   Fuller Plan, MD  oxyCODONE-acetaminophen (PERCOCET/ROXICET) 5-325 MG tablet Take 1 tablet by mouth every 8 (eight) hours as needed. 07/03/23   [provider]   pantoprazole (PROTONIX) 40 MG tablet Take 1 tablet (40 mg total) by mouth daily. 03/08/18   Yvette Rack, MD  pregabalin (LYRICA) 75 MG capsule Take 1 capsule (75 mg total) by mouth 2 (two) times daily. 06/09/23   Vivi Barrack, DPM  promethazine (PHENERGAN) 25 MG tablet Take 1 tablet (25 mg total) by mouth every 8 (eight) hours as needed for nausea or vomiting. 11/23/22   Vivi Barrack, DPM  promethazine (PHENERGAN) 25 MG tablet Take 1 tablet (25 mg total) by mouth every 8 (eight) hours as needed for nausea or vomiting. 03/29/23   Vivi Barrack, DPM  ranolazine (  RANEXA) 500 MG 12 hr tablet Take 1 tablet (500 mg total) by mouth 2 (two) times daily. 11/16/21   Tereso Newcomer T, PA-C  rosuvastatin (CRESTOR) 40 MG tablet TAKE 1 TABLET(40 MG) BY MOUTH DAILY Patient taking differently: Take 40 mg by mouth daily. 10/04/22   Tereso Newcomer T, PA-C  SANTYL ointment Apply 1 application topically daily as needed (foot).  08/22/18   [provider]  Semaglutide, 2 MG/DOSE, 8 MG/3ML SOPN Inject 2 mg as directed once a week. 02/24/23   Shamleffer, Konrad Dolores, MD  silver sulfADIAZINE (SILVADENE) 1 % cream Apply 1 Application topically daily. 02/28/23   Vivi Barrack, DPM  TRUEplus Lancets 33G MISC Check 4 times a daily 06/30/21   Shamleffer, Konrad Dolores, MD    Physical Exam: BP (!) 161/77 (BP Location: Right Arm)   Pulse 93   Temp 98.2 F (36.8 C) (Oral)   Resp 16   Ht 5\' 6"  (1.676 m)   Wt 106.6 kg   LMP 02/13/2011   SpO2 97%   BMI 37.93 kg/m   General: 63 y.o. year-old female well developed well nourished in no acute distress.  Alert and oriented x3. Cardiovascular: Regular rate and rhythm with no rubs or gallops.  No thyromegaly or JVD noted.  Respiratory: Clear to auscultation with no wheezes or rales. Good inspiratory effort. Abdomen: Soft nontender nondistended with normal bowel sounds x4 quadrants. Muskuloskeletal: No cyanosis or clubbing noted bilaterally Neuro: CN  II-XII intact, strength, sensation, reflexes Skin:    Left foot 07/20/2023. Psychiatry: Judgement and insight appear normal. Mood is appropriate for condition and setting          Labs on Admission:  Basic Metabolic Panel: Recent Labs  Lab 07/14/23 0814 07/20/23 1956  NA 142 138  K 4.6 4.3  CL 104 105  CO2 28  --   GLUCOSE 204* 158*  BUN 29* 24*  CREATININE 1.36* 0.90  CALCIUM 9.3  --    Liver Function Tests: No results for input(s): "AST", "ALT", "ALKPHOS", "BILITOT", "PROT", "ALBUMIN" in the last 168 hours. No results for input(s): "LIPASE", "AMYLASE" in the last 168 hours. No results for input(s): "AMMONIA" in the last 168 hours. CBC: Recent Labs  Lab 07/20/23 1820 07/20/23 1956  WBC 12.7*  --   NEUTROABS 8.0*  --   HGB 12.2 13.3  HCT 39.9 39.0  MCV 89.7  --   PLT 247  --    Cardiac Enzymes: No results for input(s): "CKTOTAL", "CKMB", "CKMBINDEX", "TROPONINI" in the last 168 hours.  BNP (last 3 results) No results for input(s): "BNP" in the last 8760 hours.  ProBNP (last 3 results) No results for input(s): "PROBNP" in the last 8760 hours.  CBG: No results for input(s): "GLUCAP" in the last 168 hours.  Radiological Exams on Admission: VAS [place] ABI WITH/WO TBI Result Date: 07/20/2023  LOWER EXTREMITY DOPPLER STUDY Patient Name:  [name]  Date of Exam:   07/20/2023 Medical Rec #: [id number]         Accession #:    [id number] Date of Birth: [birth year]          Patient Gender: F Patient Age:   89 years Exam Location:  [facility] Procedure:      VAS [place] ABI WITH/WO TBI Referring Phys: Carlena Hurl --------------------------------------------------------------------------------  Indications: Ulceration, and gangrene. High Risk Factors: Hypertension, hyperlipidemia, Diabetes, past history of  smoking, prior MI, coronary artery disease. Other Factors: CHF.  Comparison Study: Previous exam was on 01/22/2020 Performing Technologist:  Ernestene Mention RVT, RDMS  Examination Guidelines: A complete evaluation includes at minimum, Doppler waveform signals and systolic blood pressure reading at the level of bilateral brachial, anterior tibial, and posterior tibial arteries, when vessel segments are accessible. Bilateral testing is considered an integral part of a complete examination. Photoelectric Plethysmograph (PPG) waveforms and toe systolic pressure readings are included as required and additional duplex testing as needed. Limited examinations for reoccurring indications may be performed as noted.  ABI Findings: +---------+------------------+-----+---------+--------+ Right    Rt Pressure (mmHg)IndexWaveform Comment  +---------+------------------+-----+---------+--------+ Brachial 177                    triphasic         +---------+------------------+-----+---------+--------+ PTA      186               1.05 triphasic         +---------+------------------+-----+---------+--------+ DP       209               1.18 triphasic         +---------+------------------+-----+---------+--------+ Great Toe188               1.06 Normal            +---------+------------------+-----+---------+--------+ +---------+------------------+-----+---------+-------------------+ Left     Lt Pressure (mmHg)IndexWaveform Comment             +---------+------------------+-----+---------+-------------------+ Brachial 167                    triphasic                    +---------+------------------+-----+---------+-------------------+ PTA      191               1.08 triphasic                    +---------+------------------+-----+---------+-------------------+ DP       191               1.08 triphasic                    +---------+------------------+-----+---------+-------------------+ Great Toe                                bandaged/open wound +---------+------------------+-----+---------+-------------------+  +-------+-----------+-----------+------------+------------+ ABI/TBIToday's ABIToday's TBIPrevious ABIPrevious TBI +-------+-----------+-----------+------------+------------+ Right  1.18       1.06       1.01        0.82         +-------+-----------+-----------+------------+------------+ Left   1.08                  0.96        0.93         +-------+-----------+-----------+------------+------------+  Unable to obtain TBI of LLE due to open wound and bandage.  Summary: Right: Resting right ankle-brachial index is within normal range. The right toe-brachial index is normal. Left: Resting left ankle-brachial index is within normal range. *See table(s) above for measurements and observations.     Preliminary    MR FOOT LEFT WO CONTRAST Result Date: 07/20/2023 CLINICAL DATA:  Left great toe infection. Prior peripheral toe amputations. EXAM: MRI OF THE LEFT FOOT WITHOUT CONTRAST TECHNIQUE: Multiplanar, multisequence MR imaging of the left forefoot was performed. No  intravenous contrast was administered. COMPARISON:  Left foot x-rays from same day. MRI left foot dated Nov 06, 2022. FINDINGS: Bones/Joint/Cartilage Prominent marrow edema with corresponding decreased T1 marrow signal involving the majority of the first proximal phalanx. Prior amputation of the first distal phalanx. Prior partial amputation of the second and third toes. Focal marrow edema in the residual distal third middle phalanx (series 8, image 19). No fracture or dislocation. Mild first MTP joint degenerative changes. No joint effusion. Ligaments Collateral ligaments are intact.  Lisfranc ligament is intact. Muscles and Tendons Flexor and extensor tendons are intact. No tenosynovitis. Increased T2 signal within and atrophy of the intrinsic muscles of the forefoot, nonspecific, but likely related to diabetic muscle changes. Soft tissue Soft tissue ulceration at the first amputation stump. No fluid collection. Soft tissue swelling at the  tip of the residual third toe. No soft tissue mass. IMPRESSION: 1. Soft tissue ulceration at the first amputation stump with acute osteomyelitis of the first proximal phalanx. No abscess. 2. Prior partial amputation of the second and third toes. Focal marrow edema in the residual distal third middle phalanx with overlying soft tissue swelling, suspicious for early osteomyelitis. Correlate for ulcer on physical exam. Electronically Signed   By: Obie Dredge M.D.   On: 07/20/2023 13:12    EKG: I independently viewed the EKG done and my findings are as followed: None available at the time of this visit.  Assessment/Plan Present on Admission:  Osteomyelitis [facility])  Principal Problem:   Osteomyelitis (HCC)  Left great toe osteomyelitis, seen on MRI, POA Sed rate 63, CRP 2.1 Received 1 dose of IV vancomycin, cefepime and IV Flagyl Holding off antibiotics for now as recommended by podiatry Follow peripheral blood cultures x 2 Follow bone biopsy post op Plan for amputation on 07/21/2023 N.p.o. after midnight Pain control Gentle IV fluid hydration  Uncontrolled type 2 diabetes with hyperglycemia Hemoglobin A1c 10.2 on 07/12/2023. Heart healthy carb modified diet Start insulin sliding scale. NPO after midnight  Hyperlipidemia Resume home Crestor  Hypertension Resume home regimen Closely monitor vital signs.  GERD Resume home PPI  Chronic HFrEF Last 2D echo done in 2019 revealed LVEF 45 to 50% Euvolemic on exam Start strict I's and O's and daily weight    Time: 75 minutes.    DVT prophylaxis: Subcu heparin 3 times daily  Code Status: Full code  Family Communication: Daughter at bedside.  Disposition Plan: Admitted to telemetry surgical unit.  Consults called: Podiatry.  Admission status: Inpatient status.   Status is: Inpatient The patient requires at least 2 midnights for further evaluation and treatment of present condition.  Darlin Drop MD [facility] Pager [phone]  If 7PM-7AM, please contact night-coverage [email or web address] Password [facility]  07/20/2023, 8:50 PM

## 2023-07-20 NOTE — Progress Notes (Signed)
ABI has been completed.    Results can be found under chart review under CV PROC. 07/20/2023 6:40 PM Kelwin Gibler RVT, RDMS

## 2023-07-20 NOTE — ED Notes (Signed)
Assumed pt care from [facility]

## 2023-07-21 ENCOUNTER — Inpatient Hospital Stay (HOSPITAL_COMMUNITY): Payer: 59 | Admitting: Anesthesiology

## 2023-07-21 ENCOUNTER — Encounter (HOSPITAL_COMMUNITY)
Admission: EM | Disposition: A | Discharge status: Home Health | Payer: Self-pay | Source: Home / Self Care | Attending: Internal Medicine

## 2023-07-21 ENCOUNTER — Encounter (HOSPITAL_COMMUNITY): Payer: Self-pay | Admitting: Internal Medicine

## 2023-07-21 ENCOUNTER — Other Ambulatory Visit: Payer: Self-pay

## 2023-07-21 DIAGNOSIS — M86172 Other acute osteomyelitis, left ankle and foot: Secondary | ICD-10-CM

## 2023-07-21 DIAGNOSIS — I2583 Coronary atherosclerosis due to lipid rich plaque: Secondary | ICD-10-CM

## 2023-07-21 DIAGNOSIS — I2510 Atherosclerotic heart disease of native coronary artery without angina pectoris: Secondary | ICD-10-CM | POA: Diagnosis not present

## 2023-07-21 DIAGNOSIS — I10 Essential (primary) hypertension: Secondary | ICD-10-CM | POA: Diagnosis not present

## 2023-07-21 DIAGNOSIS — E785 Hyperlipidemia, unspecified: Secondary | ICD-10-CM | POA: Diagnosis not present

## 2023-07-21 DIAGNOSIS — M869 Osteomyelitis, unspecified: Secondary | ICD-10-CM | POA: Diagnosis not present

## 2023-07-21 HISTORY — PX: AMPUTATION TOE: SHX6595

## 2023-07-21 LAB — CBC
HCT: 35.8 % — ABNORMAL LOW (ref 36.0–46.0)
Hemoglobin: 11.4 g/dL — ABNORMAL LOW (ref 12.0–15.0)
MCH: 27.9 pg (ref 26.0–34.0)
MCHC: 31.8 g/dL (ref 30.0–36.0)
MCV: 87.5 fL (ref 80.0–100.0)
Platelets: 245 10*3/uL (ref 150–400)
RBC: 4.09 MIL/uL (ref 3.87–5.11)
RDW: 15.7 % — ABNORMAL HIGH (ref 11.5–15.5)
WBC: 11.6 10*3/uL — ABNORMAL HIGH (ref 4.0–10.5)
nRBC: 0 % (ref 0.0–0.2)

## 2023-07-21 LAB — BASIC METABOLIC PANEL
Anion gap: 12 (ref 5–15)
BUN: 17 mg/dL (ref 8–23)
CO2: 18 mmol/L — ABNORMAL LOW (ref 22–32)
Calcium: 8.9 mg/dL (ref 8.9–10.3)
Chloride: 106 mmol/L (ref 98–111)
Creatinine, Ser: 1.17 mg/dL — ABNORMAL HIGH (ref 0.44–1.00)
GFR, Estimated: 52 mL/min — ABNORMAL LOW (ref >60)
Glucose, Bld: 172 mg/dL — ABNORMAL HIGH (ref 70–99)
Potassium: 4 mmol/L (ref 3.5–5.1)
Sodium: 136 mmol/L (ref 135–145)

## 2023-07-21 LAB — GLUCOSE, CAPILLARY
Glucose-Capillary: 182 mg/dL — ABNORMAL HIGH (ref 70–99)
Glucose-Capillary: 112 mg/dL — ABNORMAL HIGH (ref 70–99)
Glucose-Capillary: 193 mg/dL — ABNORMAL HIGH (ref 70–99)
Glucose-Capillary: 235 mg/dL — ABNORMAL HIGH (ref 70–99)

## 2023-07-21 LAB — MAGNESIUM: Magnesium: 2 mg/dL (ref 1.7–2.4)

## 2023-07-21 LAB — PHOSPHORUS: Phosphorus: 3.1 mg/dL (ref 2.5–4.6)

## 2023-07-21 LAB — HIV ANTIBODY (ROUTINE TESTING W REFLEX): HIV Screen 4th Generation wRfx: NONREACTIVE

## 2023-07-21 SURGERY — AMPUTATION, TOE
Anesthesia: Monitor Anesthesia Care | Site: Toe | Laterality: Left

## 2023-07-21 MED ORDER — HYDRALAZINE HCL 20 MG/ML IJ SOLN
10.0000 mg | Freq: Four times a day (QID) | INTRAMUSCULAR | Status: DC | PRN
Start: 2023-07-21 — End: 2023-07-23

## 2023-07-21 MED ORDER — LIDOCAINE HCL (PF) 1 % IJ SOLN
INTRAMUSCULAR | Status: AC
  Filled 2023-07-21: qty 30

## 2023-07-21 MED ORDER — EMPAGLIFLOZIN 25 MG PO TABS
25.0000 mg | ORAL_TABLET | Freq: Every morning | ORAL | Status: DC
  Administered 2023-07-21 – 2023-07-23 (×3): 25 mg via ORAL
  Filled 2023-07-21 (×3): qty 1

## 2023-07-21 MED ORDER — INSULIN DEGLUDEC 100 UNIT/ML ~~LOC~~ SOPN: 15.0000 [IU] | PEN_INJECTOR | Freq: Every day | SUBCUTANEOUS | Status: DC

## 2023-07-21 MED ORDER — FENTANYL CITRATE (PF) 250 MCG/5ML IJ SOLN
INTRAMUSCULAR | Status: AC
  Filled 2023-07-21: qty 5

## 2023-07-21 MED ORDER — RANOLAZINE ER 500 MG PO TB12
500.0000 mg | ORAL_TABLET | Freq: Two times a day (BID) | ORAL | Status: DC
  Administered 2023-07-21 – 2023-07-23 (×4): 500 mg via ORAL
  Filled 2023-07-21 (×5): qty 1

## 2023-07-21 MED ORDER — ACETAMINOPHEN 500 MG PO TABS
ORAL_TABLET | ORAL | Status: AC
  Administered 2023-07-21: 1000 mg via ORAL
  Filled 2023-07-21: qty 2

## 2023-07-21 MED ORDER — CHLORHEXIDINE GLUCONATE 0.12 % MT SOLN
OROMUCOSAL | Status: AC
  Administered 2023-07-21: 15 mL via OROMUCOSAL
  Filled 2023-07-21: qty 15

## 2023-07-21 MED ORDER — BUPIVACAINE HCL 0.5 % IJ SOLN
INTRAMUSCULAR | Status: AC
  Filled 2023-07-21: qty 1

## 2023-07-21 MED ORDER — FUROSEMIDE 20 MG PO TABS
20.0000 mg | ORAL_TABLET | Freq: Every day | ORAL | Status: DC
  Administered 2023-07-21 – 2023-07-22 (×2): 20 mg via ORAL
  Filled 2023-07-21 (×2): qty 1

## 2023-07-21 MED ORDER — ORAL CARE MOUTH RINSE: 15.0000 mL | Freq: Once | OROMUCOSAL | Status: AC

## 2023-07-21 MED ORDER — LIDOCAINE 2% (20 MG/ML) 5 ML SYRINGE
INTRAMUSCULAR | Status: DC | PRN
  Administered 2023-07-21: 40 mg via INTRAVENOUS

## 2023-07-21 MED ORDER — CHLORHEXIDINE GLUCONATE 0.12 % MT SOLN: 15.0000 mL | Freq: Once | OROMUCOSAL | Status: AC

## 2023-07-21 MED ORDER — 0.9 % SODIUM CHLORIDE (POUR BTL) OPTIME
TOPICAL | Status: DC | PRN
  Administered 2023-07-21: 1000 mL

## 2023-07-21 MED ORDER — AMOXICILLIN-POT CLAVULANATE 875-125 MG PO TABS
1.0000 | ORAL_TABLET | Freq: Two times a day (BID) | ORAL | Status: DC
  Administered 2023-07-21 – 2023-07-23 (×4): 1 via ORAL
  Filled 2023-07-21 (×4): qty 1

## 2023-07-21 MED ORDER — LISINOPRIL 2.5 MG PO TABS
2.5000 mg | ORAL_TABLET | Freq: Every morning | ORAL | Status: DC
  Administered 2023-07-22: 2.5 mg via ORAL
  Filled 2023-07-21 (×4): qty 1

## 2023-07-21 MED ORDER — ACETAMINOPHEN 500 MG PO TABS: 1000.0000 mg | ORAL_TABLET | Freq: Once | ORAL | Status: AC

## 2023-07-21 MED ORDER — INSULIN GLARGINE-YFGN 100 UNIT/ML ~~LOC~~ SOLN
15.0000 [IU] | Freq: Every day | SUBCUTANEOUS | Status: DC
  Administered 2023-07-21 – 2023-07-22 (×2): 15 [IU] via SUBCUTANEOUS
  Filled 2023-07-21 (×4): qty 0.15

## 2023-07-21 MED ORDER — LIDOCAINE HCL (PF) 1 % IJ SOLN
INTRAMUSCULAR | Status: DC | PRN
  Administered 2023-07-21: 10 mL via SURGICAL_CAVITY

## 2023-07-21 MED ORDER — PROPOFOL 10 MG/ML IV BOLUS
INTRAVENOUS | Status: DC | PRN
  Administered 2023-07-21: 20 mg via INTRAVENOUS
  Administered 2023-07-21: 50 mg via INTRAVENOUS
  Administered 2023-07-21: 50 ug/kg/min via INTRAVENOUS

## 2023-07-21 SURGICAL SUPPLY — 32 items
BLADE OSC/SAGITTAL MD 5.5X18 (BLADE) ×1 IMPLANT
BLADE SURG 10 STRL SS (BLADE) ×1 IMPLANT
BLADE SURG 15 STRL LF DISP TIS (BLADE) ×2 IMPLANT
BNDG COHESIVE 3X5 TAN ST LF (GAUZE/BANDAGES/DRESSINGS) ×1 IMPLANT
BNDG ELASTIC 3INX 5YD STR LF (GAUZE/BANDAGES/DRESSINGS) ×1 IMPLANT
BNDG ELASTIC 4INX 5YD STR LF (GAUZE/BANDAGES/DRESSINGS) IMPLANT
BNDG ESMARK 4X9 LF (GAUZE/BANDAGES/DRESSINGS) ×1 IMPLANT
CHLORAPREP W/TINT 26 (MISCELLANEOUS) ×1 IMPLANT
ELECT REM PT RETURN 9FT ADLT (ELECTROSURGICAL) ×1
ELECTRODE REM PT RTRN 9FT ADLT (ELECTROSURGICAL) ×1 IMPLANT
GAUZE SPONGE 2X2 STRL 8-PLY (GAUZE/BANDAGES/DRESSINGS) ×2 IMPLANT
GAUZE SPONGE 4X4 12PLY STRL (GAUZE/BANDAGES/DRESSINGS) ×1 IMPLANT
GAUZE STRETCH 2X75IN STRL (MISCELLANEOUS) ×1 IMPLANT
GAUZE XEROFORM 1X8 LF (GAUZE/BANDAGES/DRESSINGS) ×1 IMPLANT
GLOVE BIO SURGEON STRL SZ7.5 (GLOVE) ×1 IMPLANT
GLOVE BIOGEL PI IND STRL 7.5 (GLOVE) ×1 IMPLANT
GOWN STRL REUS W/ TWL LRG LVL3 (GOWN DISPOSABLE) ×2 IMPLANT
KIT BASIN OR (CUSTOM PROCEDURE TRAY) ×1 IMPLANT
NDL BIOPSY JAMSHIDI 8X6 (NEEDLE) IMPLANT
NDL HYPO 25X1 1.5 SAFETY (NEEDLE) ×2 IMPLANT
NEEDLE BIOPSY JAMSHIDI 8X6 (NEEDLE)
NEEDLE HYPO 25X1 1.5 SAFETY (NEEDLE) ×2
PACK ORTHO EXTREMITY (CUSTOM PROCEDURE TRAY) ×1 IMPLANT
PADDING CAST ABS COTTON 4X4 ST (CAST SUPPLIES) ×2 IMPLANT
SPIKE FLUID TRANSFER (MISCELLANEOUS) ×2 IMPLANT
STAPLER VISISTAT 35W (STAPLE) IMPLANT
STOCKINETTE 4X48 STRL (DRAPES) ×1 IMPLANT
SUT ETHILON 3 0 FSLX (SUTURE) ×1 IMPLANT
SUT PROLENE 4 0 PS 2 18 (SUTURE) ×2 IMPLANT
SYR CONTROL 10ML LL (SYRINGE) ×2 IMPLANT
UNDERPAD 30X36 HEAVY ABSORB (UNDERPADS AND DIAPERS) ×1 IMPLANT
WATER STERILE IRR 1000ML POUR (IV SOLUTION) ×1 IMPLANT

## 2023-07-21 NOTE — Progress Notes (Signed)
PROGRESS NOTE        PATIENT DETAILS Name: [name] Age: 63 y.o. Sex: female Date of Birth: December 11, 1960 Admit Date: 07/20/2023 Admitting Physician Darlin Drop, DO WJX:BJYNW, Marlynn Perking., FNP  Brief Summary: Patient is a 63 y.o.  female with history of CAD, HTN, HLD, DM-2, diabetic polyneuropathy-prior history left third toe amputation- sent to the ED by podiatrist-for worsening left great toe (chronic ulcer for months-but worsening for the past 7-10 days) infection-upon further evaluation-she was found to have osteomyelitis.  Significant events: 1/23>> admit to [facility]-left great toe osteomyelitis.  Significant studies: 1/23>> MRI left foot: Soft tissue ulceration at the first amputation stump with acute osteomyelitis of the first proximal phalanx-possible early osteomyelitis of residual distal third/middle phalanx. 1/23>> ESR: 63  Significant microbiology data: 1/23>> blood culture: Pending  Procedures: None  Consults: Podiatry  Subjective: Lying comfortably in bed-denies any chest pain or shortness of breath.  No major issues overnight.  Objective: Vitals: Blood pressure 130/68, pulse 86, temperature 98 F (36.7 C), resp. rate 18, height 5\' 6"  (1.676 m), weight 105.2 kg, last menstrual period 02/13/2011, SpO2 100%.   Exam: Gen Exam:Alert awake-not in any distress HEENT:atraumatic, normocephalic Chest: B/L clear to auscultation anteriorly CVS:S1S2 regular Abdomen:soft non tender, non distended Extremities:no edema-good dorsalis pedis pulse--see picture below Neurology: Non focal Skin: no rash    Pertinent Labs/Radiology:    Latest Ref Rng & Units 07/20/2023    7:56 PM 07/20/2023    6:20 PM 01/07/2022    8:53 AM  CBC  WBC 4.0 - 10.5 K/uL  12.7  8.1   Hemoglobin 12.0 - 15.0 g/dL 29.5  62.1  30.8   Hematocrit 36.0 - 46.0 % 39.0  39.9  37.0   Platelets 150 - 400 K/uL  247  251     Lab Results  Component Value Date   NA 138  07/20/2023   K 4.3 07/20/2023   CL 105 07/20/2023   CO2 28 07/14/2023      Assessment/Plan: Left diabetic foot with acute osteomyelitis of first proximal phalanx of left great toe-possible early osteomyelitis of residual left distal third middle phalanx (prior history of left third toe amputation) Clinically stable-nontoxic-appearing On empiric antibiotics Follow cultures Remains n.p.o. for amputation later today  CAD (LHC 2019-diffuse disease-no targets for CABG-medical treatment recommended) No anginal symptoms Resume Plavix post amputation Continue metoprolol/Imdur/statin  Chronic HFmrEF (EF 45-50% November 2019) Euvolemic Continue metoprolol/lisinopril/Jardiance/furosemide  DM-2 (A1c 10.2 on 1/15) CBGs relatively stable Continue SSI Start Semglee at 15 units (currently n.p.o.)-normally takes 44 units daily Follow CBGs and adjust  HLD Statin  HTN BP stable Continue Imdur/lisinopril/metoprolol Follow/optimize  Peripheral neuropathy Related to DM Lyrica  Class 1 Obesity: Estimated body mass index is 37.43 kg/m as calculated from the following:   Height as of this encounter: 5\' 6"  (1.676 m).   Weight as of this encounter: 105.2 kg.   Code status:   Code Status: Full Code   DVT Prophylaxis: heparin injection 5,000 Units Start: 07/20/23 2200   Family Communication: None at bedside   Disposition Plan: Status is: Inpatient Remains inpatient appropriate because: Severity of illness   Planned Discharge Destination:Home   Diet: Diet Order             Diet NPO time specified Except for: Sips with Meds  Diet effective now  Antimicrobial agents: Anti-infectives (From admission, onward)    Start     Dose/Rate Route Frequency Ordered Stop   07/21/23 1700  vancomycin (VANCOREADY) IVPB 750 mg/150 mL  Status:  Discontinued        750 mg 150 mL/hr over 60 Minutes Intravenous Every 24 hours 07/20/23 2058 07/20/23 2111   07/21/23  1700  metroNIDAZOLE (FLAGYL) IVPB 500 mg        500 mg 100 mL/hr over 60 Minutes Intravenous Every 12 hours 07/20/23 2117     07/21/23 1700  ceFEPIme (MAXIPIME) 2 g in sodium chloride 0.9 % 100 mL IVPB        2 g 200 mL/hr over 30 Minutes Intravenous Every 8 hours 07/20/23 2117     07/21/23 0700  metroNIDAZOLE (FLAGYL) IVPB 500 mg  Status:  Discontinued        500 mg 100 mL/hr over 60 Minutes Intravenous Every 12 hours 07/20/23 2049 07/20/23 2116   07/21/23 0300  ceFEPIme (MAXIPIME) 2 g in sodium chloride 0.9 % 100 mL IVPB  Status:  Discontinued        2 g 200 mL/hr over 30 Minutes Intravenous Every 8 hours 07/20/23 2049 07/20/23 2116   07/20/23 2117  vancomycin variable dose per unstable renal function (pharmacist dosing)         Does not apply See admin instructions 07/20/23 2117     07/20/23 1800  ceFEPIme (MAXIPIME) 2 g in sodium chloride 0.9 % 100 mL IVPB        2 g 200 mL/hr over 30 Minutes Intravenous  Once 07/20/23 1749 07/20/23 2107   07/20/23 1800  metroNIDAZOLE (FLAGYL) IVPB 500 mg        500 mg 100 mL/hr over 60 Minutes Intravenous  Once 07/20/23 1749 07/20/23 1951   07/20/23 1800  vancomycin (VANCOREADY) IVPB 2000 mg/400 mL        2,000 mg 200 mL/hr over 120 Minutes Intravenous  Once 07/20/23 1753 07/20/23 2145        MEDICATIONS: Scheduled Meds:  dorzolamide  1 drop Both Eyes BID   heparin  5,000 Units Subcutaneous Q8H   insulin aspart  0-5 Units Subcutaneous QHS   insulin aspart  0-9 Units Subcutaneous TID WC   isosorbide mononitrate  15 mg Oral Daily   metoprolol tartrate  25 mg Oral BID   Netarsudil-Latanoprost  1 drop Both Eyes QHS   pantoprazole  40 mg Oral Daily   pregabalin  75 mg Oral BID   rosuvastatin  40 mg Oral QPM   vancomycin variable dose per unstable renal function (pharmacist dosing)   Does not apply See admin instructions   Continuous Infusions:  sodium chloride 40 mL/hr at 07/21/23 0656   ceFEPime (MAXIPIME) IV     metronidazole     PRN  Meds:.acetaminophen, diphenhydrAMINE, hydrALAZINE, HYDROmorphone (DILAUDID) injection, melatonin, oxyCODONE, polyethylene glycol, prochlorperazine   I have personally reviewed following labs and imaging studies  LABORATORY DATA: CBC: Recent Labs  Lab 07/20/23 1820 07/20/23 1956  WBC 12.7*  --   NEUTROABS 8.0*  --   HGB 12.2 13.3  HCT 39.9 39.0  MCV 89.7  --   PLT 247  --     Basic Metabolic Panel: Recent Labs  Lab 07/20/23 1956  NA 138  K 4.3  CL 105  GLUCOSE 158*  BUN 24*  CREATININE 0.90    GFR: Estimated Creatinine Clearance: 77.5 mL/min (by C-G formula based on SCr of 0.9 mg/dL).  Liver Function  Tests: No results for input(s): "AST", "ALT", "ALKPHOS", "BILITOT", "PROT", "ALBUMIN" in the last 168 hours. No results for input(s): "LIPASE", "AMYLASE" in the last 168 hours. No results for input(s): "AMMONIA" in the last 168 hours.  Coagulation Profile: No results for input(s): "INR", "PROTIME" in the last 168 hours.  Cardiac Enzymes: No results for input(s): "CKTOTAL", "CKMB", "CKMBINDEX", "TROPONINI" in the last 168 hours.  BNP (last 3 results) No results for input(s): "PROBNP" in the last 8760 hours.  Lipid Profile: No results for input(s): "CHOL", "HDL", "LDLCALC", "TRIG", "CHOLHDL", "LDLDIRECT" in the last 72 hours.  Thyroid Function Tests: No results for input(s): "TSH", "T4TOTAL", "FREET4", "T3FREE", "THYROIDAB" in the last 72 hours.  Anemia Panel: No results for input(s): "VITAMINB12", "FOLATE", "FERRITIN", "TIBC", "IRON", "RETICCTPCT" in the last 72 hours.  Urine analysis:    Component Value Date/Time   COLORURINE STRAW (A) 05/22/2018 0328   APPEARANCEUR CLEAR 05/22/2018 0328   LABSPEC 1.015 05/22/2018 0328   PHURINE 5.0 05/22/2018 0328   GLUCOSEU >=500 (A) 05/22/2018 0328   GLUCOSEU 100 (A) 01/15/2009 2113   HGBUR NEGATIVE 05/22/2018 0328   BILIRUBINUR NEGATIVE 05/22/2018 0328   BILIRUBINUR neg 09/07/2016 1016   KETONESUR NEGATIVE  05/22/2018 0328   PROTEINUR NEGATIVE 05/22/2018 0328   UROBILINOGEN 0.2 09/07/2016 1016   UROBILINOGEN 0.2 10/23/2014 2220   NITRITE NEGATIVE 05/22/2018 0328   LEUKOCYTESUR NEGATIVE 05/22/2018 0328    Sepsis Labs: Lactic Acid, Venous No results found for: "LATICACIDVEN"  MICROBIOLOGY: No results found for this or any previous visit (from the past 240 hours).  RADIOLOGY STUDIES/RESULTS: VAS [place] ABI WITH/WO TBI Result Date: 07/20/2023  LOWER EXTREMITY DOPPLER STUDY Patient Name:  MARNESHA GAGEN  Date of Exam:   07/20/2023 Medical Rec #: [id number]         Accession #:    [id number] Date of Birth: 08-Jun-1961          Patient Gender: F Patient Age:   26 years Exam Location:  [facility] Procedure:      VAS [place] ABI WITH/WO TBI Referring Phys: Carlena Hurl --------------------------------------------------------------------------------  Indications: Ulceration, and gangrene. High Risk Factors: Hypertension, hyperlipidemia, Diabetes, past history of                    smoking, prior MI, coronary artery disease. Other Factors: CHF.  Comparison Study: Previous exam was on 01/22/2020 Performing Technologist: Ernestene Mention RVT, RDMS  Examination Guidelines: A complete evaluation includes at minimum, Doppler waveform signals and systolic blood pressure reading at the level of bilateral brachial, anterior tibial, and posterior tibial arteries, when vessel segments are accessible. Bilateral testing is considered an integral part of a complete examination. Photoelectric Plethysmograph (PPG) waveforms and toe systolic pressure readings are included as required and additional duplex testing as needed. Limited examinations for reoccurring indications may be performed as noted.  ABI Findings: +---------+------------------+-----+---------+--------+ Right    Rt Pressure (mmHg)IndexWaveform Comment  +---------+------------------+-----+---------+--------+ Brachial 177                    triphasic          +---------+------------------+-----+---------+--------+ PTA      186               1.05 triphasic         +---------+------------------+-----+---------+--------+ DP       209               1.18 triphasic         +---------+------------------+-----+---------+--------+  Great Toe188               1.06 Normal            +---------+------------------+-----+---------+--------+ +---------+------------------+-----+---------+-------------------+ Left     Lt Pressure (mmHg)IndexWaveform Comment             +---------+------------------+-----+---------+-------------------+ Brachial 167                    triphasic                    +---------+------------------+-----+---------+-------------------+ PTA      191               1.08 triphasic                    +---------+------------------+-----+---------+-------------------+ DP       191               1.08 triphasic                    +---------+------------------+-----+---------+-------------------+ Great Toe                                bandaged/open wound +---------+------------------+-----+---------+-------------------+ +-------+-----------+-----------+------------+------------+ ABI/TBIToday's ABIToday's TBIPrevious ABIPrevious TBI +-------+-----------+-----------+------------+------------+ Right  1.18       1.06       1.01        0.82         +-------+-----------+-----------+------------+------------+ Left   1.08                  0.96        0.93         +-------+-----------+-----------+------------+------------+  Unable to obtain TBI of LLE due to open wound and bandage.  Summary: Right: Resting right ankle-brachial index is within normal range. The right toe-brachial index is normal. Left: Resting left ankle-brachial index is within normal range. *See table(s) above for measurements and observations.     Preliminary    MR FOOT LEFT WO CONTRAST Result Date: 07/20/2023 CLINICAL DATA:  Left great toe  infection. Prior peripheral toe amputations. EXAM: MRI OF THE LEFT FOOT WITHOUT CONTRAST TECHNIQUE: Multiplanar, multisequence MR imaging of the left forefoot was performed. No intravenous contrast was administered. COMPARISON:  Left foot x-rays from same day. MRI left foot dated Nov 06, 2022. FINDINGS: Bones/Joint/Cartilage Prominent marrow edema with corresponding decreased T1 marrow signal involving the majority of the first proximal phalanx. Prior amputation of the first distal phalanx. Prior partial amputation of the second and third toes. Focal marrow edema in the residual distal third middle phalanx (series 8, image 19). No fracture or dislocation. Mild first MTP joint degenerative changes. No joint effusion. Ligaments Collateral ligaments are intact.  Lisfranc ligament is intact. Muscles and Tendons Flexor and extensor tendons are intact. No tenosynovitis. Increased T2 signal within and atrophy of the intrinsic muscles of the forefoot, nonspecific, but likely related to diabetic muscle changes. Soft tissue Soft tissue ulceration at the first amputation stump. No fluid collection. Soft tissue swelling at the tip of the residual third toe. No soft tissue mass. IMPRESSION: 1. Soft tissue ulceration at the first amputation stump with acute osteomyelitis of the first proximal phalanx. No abscess. 2. Prior partial amputation of the second and third toes. Focal marrow edema in the residual distal third middle phalanx with overlying soft tissue swelling, suspicious for early osteomyelitis. Correlate for ulcer on physical exam. Electronically Signed  By: Obie Dredge M.D.   On: 07/20/2023 13:12     LOS: 1 day   Jeoffrey Massed, MD  [facility]    To contact the attending provider between 7A-7P or the covering provider during after hours 7P-7A, please log into the web site [email or web address] and access using universal [place] password for that web site. If you do not have the password, please call  the hospital operator.  07/21/2023, 8:32 AM

## 2023-07-21 NOTE — Progress Notes (Signed)
OT Cancellation Note  Patient Details Name: [name] MRN: [id number] DOB: 07/20/1960   Cancelled Treatment:    Reason Eval/Treat Not Completed: Medical issues which prohibited therapy Per chart, patient with planned L great toe amputation on this date (1/24). OT will attempt evaluation after amputation to accurately assess discharge needs.   Pollyann Glen E. Linell Shawn, OTR/L Acute Rehabilitation Services [phone]   Cherlyn Cushing 07/21/2023, 8:15 AM

## 2023-07-21 NOTE — Op Note (Signed)
Full Operative Report  Date of Operation: 12:09 PM, 07/21/2023   Patient: [name] - 63 y.o. female  Surgeon: Pilar Plate, DPM   Assistant: None  Diagnosis: LEFT GREAT TOE OSTEOMYLITIS  Procedure:  1. Amputation of great toe at MPJ level, Left foot    Anesthesia: Anesthesia type not filed in the log.  No responsible provider has been recorded for the case.  CRNA: Alwyn Ren, CRNA   Estimated Blood Loss: Minimal  Hemostasis: 1) Anatomical dissection, mechanical compression, electrocautery 2) No tourniquet was used  Implants: * No implants in log *  Materials: Prolene 3-0  Injectables: 1) Pre-operatively: 10 cc of 0.5% marcaine plain 2) Post-operatively: None   Specimens: Pathology: Left hallux for path  Microbiology: bone culture proximal phalanx    Antibiotics: IV antibiotics given per schedule on the floor  Drains: None  Complications: Patient tolerated the procedure well without complication.   Operative findings: As below in detailed report  Indications for Procedure: [name] presents to [facility], [place] with a chief complaint of open ulceration with bone exposed to the left proximal phalanx, mri with osteomyelitis. The patient has failed conservative treatments of various modalities. At this time the patient has elected to proceed with surgical correction. All alternatives, risks, and complications of the procedures were thoroughly explained to the patient. Patient exhibits appropriate understanding of all discussion points and informed consent was signed and obtained in the chart with no guarantees to surgical outcome given or implied.  Description of Procedure: Patient was brought to the operating room. Patient remained on their hospital bed in the supine position. A surgical timeout was performed and all members of the operating room, the procedure, and the surgical site were identified. anesthesia occurred as per  anesthesia record. Local anesthetic as previously described was then injected about the operative [name] in a local infiltrative block.  The operative lower extremity as noted above was then prepped and draped in the usual sterile manner. The following procedure then began.  Attention was directed to the 1st digit on the left foot - prior partial toe amp at IPJ level. A full-thickness incision encompassing the entire digit was made using a #15 blade. Dissection was carried down to bone. The toe was secured with a towel clamp, further dissected in its entirety, and disarticulated at the MPJ and passed to the back table as a gross specimen. This was then labled and sent to pathology. The bone was noted to be soft and eroded, and consistent with osteomyelitis. A bone culture was taken with rongeur and sent for micro. All remaining necrotic and devitalized soft tissue structures were visualized and dissected away using sharp and dull dissection. Care was taken to protect all neurovascular structures throughout the dissection. All bleeders were cauterized as necessary.   The area was then flushed with copious amounts of sterile saline. Then using the suture materials previously described, the site was closed in anatomic layers and the skin was well approximated under minimal tension.   The surgical site was then dressed with xeroform, 4x4 kerlix ace. The patient tolerated both the procedure and anesthesia well with vital signs stable throughout. The patient was transferred in good condition and all vital signs stable  from the OR to recovery under the discretion of anesthesia.  Condition: Vital signs stable, neurovascular status unchanged from preoperative   Surgical plan:  Good bleeding noted intra op, clean margin expected. WBAT in post op shoe. Follow up 7 days in  clinic for post op. 7 days Augmentin at DC.   The patient will be WBAT in a post op shoe to the operative limb until further instructed. The  dressing is to remain clean, dry, and intact. Will continue to follow unless noted elsewhere.   Carlena Hurl, DPM [facility]

## 2023-07-21 NOTE — Plan of Care (Signed)
Problem: Education: Goal: Ability to describe self-care measures that may prevent or decrease complications (Diabetes Survival Skills Education) will improve Outcome: Progressing   Problem: Coping: Goal: Ability to adjust to condition or change in health will improve Outcome: Progressing   Problem: Fluid Volume: Goal: Ability to maintain a balanced intake and output will improve Outcome: Progressing   Problem: Health Behavior/Discharge Planning: Goal: Ability to manage health-related needs will improve Outcome: Progressing   Problem: Metabolic: Goal: Ability to maintain appropriate glucose levels will improve Outcome: Progressing   Problem: Skin Integrity: Goal: Risk for impaired skin integrity will decrease Outcome: Progressing   Problem: Tissue Perfusion: Goal: Adequacy of tissue perfusion will improve Outcome: Progressing

## 2023-07-21 NOTE — Anesthesia Postprocedure Evaluation (Signed)
Anesthesia Post Note  Patient: [name]  Procedure(s) Performed: AMPUTATION LEFT GREAT TOE (Left: Toe)     Patient location during evaluation: PACU Anesthesia Type: MAC Level of consciousness: awake and alert Pain management: pain level controlled Vital Signs Assessment: post-procedure vital signs reviewed and stable Respiratory status: spontaneous breathing, nonlabored ventilation and respiratory function stable Cardiovascular status: blood pressure returned to baseline and stable Postop Assessment: no apparent nausea or vomiting Anesthetic complications: no   No notable events documented.  Last Vitals:  Vitals:   07/21/23 1307 07/21/23 1330  BP: (!) 144/67 (!) 132/103  Pulse: 83 81  Resp: 17 20  Temp: 36.6 C 36.6 C  SpO2: 100% 100%    Last Pain:  Vitals:   07/21/23 1330  TempSrc: Oral  PainSc: 10-Worst pain ever                 Lannie Fields

## 2023-07-21 NOTE — Progress Notes (Signed)
Orthopedic Tech Progress Note Patient Details:  [name] 04-Aug-1960 [id number]  Ortho Devices Type of Ortho Device: Postop shoe/boot Ortho Device/Splint Location: LLE Ortho Device/Splint Interventions: Ordered, Application   Post Interventions Patient Tolerated: Well Instructions Provided: Adjustment of device, Care of device  Tonye Pearson 07/21/2023, 1:52 PM

## 2023-07-21 NOTE — Progress Notes (Signed)
History and Physical Interval Note:  07/21/2023 12:08 PM  [name]  has presented today for surgery, with the diagnosis of osteomyelitis of proximal phalanx of left great toe.  The various methods of treatment have been discussed with the patient and family. After consideration of risks, benefits and other options for treatment, the patient has consented to   Procedure(s): AMPUTATION LEFT GREAT TOE (Left) at MPJ level as a surgical intervention.  The patient's history has been reviewed, patient examined, no change in status, stable for surgery.  I have reviewed the patient's chart and labs.  Questions were answered to the patient's satisfaction.     Jenelle Mages Malisha Mabey

## 2023-07-21 NOTE — Anesthesia Preprocedure Evaluation (Signed)
Anesthesia Evaluation  Patient identified by MRN, date of birth, ID band Patient awake    Reviewed: Allergy & Precautions, H&P , NPO status , Patient's Chart, lab work & pertinent test results, reviewed documented beta blocker date and time   Airway Mallampati: III  TM Distance: >3 FB Neck ROM: Full    Dental  (+) Edentulous Upper   Pulmonary neg pulmonary ROS, former smoker   Pulmonary exam normal breath sounds clear to auscultation       Cardiovascular hypertension (135/64 preop), Pt. on medications and Pt. on home beta blockers + CAD (2019 no intervention), + Past MI and +CHF (LVEF 45-50%)  Normal cardiovascular exam Rhythm:Regular Rate:Normal  Cath 2019 Severe multivessel CAD with mild calcification of the proximal LAD, long diffuse 90% stenosis in the first diagonal vessel of the LAD, 50% proximal -mid, 80% diffuse mid LAD stenosis and 70% distal LAD stenoses; circumflex vessel with a large second marginal branch with focal 95% stenosis in the distal portion of this marginal vessel and 90 and 80% mid distal AV groove circumflex stenoses; and mild segmental 30% narrowing before and after the acute margin in the RCA.   New severe LV dysfunction with an EF of 25 to 30% with diffuse hypocontractility involving the mid distal anterolateral wall apex, and apical distal inferior wall and LVEDP 19 mm    Echo 2019 - Left ventricle: The cavity size was normal. There was mild    concentric hypertrophy. Systolic function was mildly reduced. The    estimated ejection fraction was in the range of 45% to 50%.    Diffuse hypokinesis. Although no diagnostic regional wall motion    abnormality was identified, this possibility cannot be completely    excluded on the basis of this study. The study is not technically    sufficient to allow evaluation of LV diastolic function.  - Aortic valve: There was no significant regurgitation.  - Mitral  valve: There was no significant regurgitation.  - Right ventricle: Systolic function was normal.  - Atrial septum: No defect or patent foramen ovale was identified.     Neuro/Psych  negative psych ROS   GI/Hepatic negative GI ROS, Neg liver ROS,,,  Endo/Other  diabetes, Poorly Controlled, Type 2, Insulin Dependent  FS 182 preop Obesity BMI 38 A1c 10.2  Renal/GU Renal InsufficiencyRenal diseaseCr 1.17  negative genitourinary   Musculoskeletal  (+) Arthritis , Osteoarthritis,    Abdominal  (+) + obese  Peds negative pediatric ROS (+)  Hematology negative hematology ROS (+)   Anesthesia Other Findings Ozempic LD 2 weeks  Reproductive/Obstetrics negative OB ROS                              Anesthesia Physical Anesthesia Plan  ASA: 4  Anesthesia Plan: MAC   Post-op Pain Management: Tylenol PO (pre-op)*   Induction:   PONV Risk Score and Plan: 2 and Propofol infusion and TIVA  Airway Management Planned: Natural Airway and Simple Face Mask  Additional Equipment: None  Intra-op Plan:   Post-operative Plan:   Informed Consent: I have reviewed the patients History and Physical, chart, labs and discussed the procedure including the risks, benefits and alternatives for the proposed anesthesia with the patient or authorized representative who has indicated his/her understanding and acceptance.       Plan Discussed with: CRNA  Anesthesia Plan Comments:          Anesthesia Quick Evaluation

## 2023-07-21 NOTE — Plan of Care (Signed)

## 2023-07-21 NOTE — Transfer of Care (Signed)
Immediate Anesthesia Transfer of Care Note  Patient: [name]  Procedure(s) Performed: AMPUTATION LEFT GREAT TOE (Left: Toe)  Patient Location: PACU  Anesthesia Type:MAC  Level of Consciousness: awake, alert , and oriented  Airway & Oxygen Therapy: Patient Spontanous Breathing  Post-op Assessment: Report given to RN and Post -op Vital signs reviewed and stable  Post vital signs: Reviewed and stable  Last Vitals:  Vitals Value Taken Time  BP 109/98 07/21/23 1251  Temp 36.7 C 07/21/23 1251  Pulse 82 07/21/23 1253  Resp 23 07/21/23 1253  SpO2 100 % 07/21/23 1253  Vitals shown include unfiled device data.  Last Pain:  Vitals:   07/21/23 1100  TempSrc:   PainSc: 0-No pain         Complications: No notable events documented.

## 2023-07-22 ENCOUNTER — Encounter (HOSPITAL_COMMUNITY): Payer: Self-pay | Admitting: Podiatry

## 2023-07-22 DIAGNOSIS — E66812 Obesity, class 2: Secondary | ICD-10-CM

## 2023-07-22 DIAGNOSIS — Z89422 Acquired absence of other left toe(s): Secondary | ICD-10-CM | POA: Diagnosis not present

## 2023-07-22 DIAGNOSIS — I5020 Unspecified systolic (congestive) heart failure: Secondary | ICD-10-CM

## 2023-07-22 DIAGNOSIS — E1165 Type 2 diabetes mellitus with hyperglycemia: Secondary | ICD-10-CM

## 2023-07-22 DIAGNOSIS — M869 Osteomyelitis, unspecified: Secondary | ICD-10-CM | POA: Diagnosis not present

## 2023-07-22 DIAGNOSIS — I25119 Atherosclerotic heart disease of native coronary artery with unspecified angina pectoris: Secondary | ICD-10-CM

## 2023-07-22 DIAGNOSIS — I10 Essential (primary) hypertension: Secondary | ICD-10-CM

## 2023-07-22 DIAGNOSIS — E7800 Pure hypercholesterolemia, unspecified: Secondary | ICD-10-CM

## 2023-07-22 LAB — BASIC METABOLIC PANEL
Anion gap: 12 (ref 5–15)
BUN: 15 mg/dL (ref 8–23)
CO2: 22 mmol/L (ref 22–32)
Calcium: 9.2 mg/dL (ref 8.9–10.3)
Chloride: 104 mmol/L (ref 98–111)
Creatinine, Ser: 1.03 mg/dL — ABNORMAL HIGH (ref 0.44–1.00)
GFR, Estimated: >60 mL/min (ref >60)
Glucose, Bld: 171 mg/dL — ABNORMAL HIGH (ref 70–99)
Potassium: 3.5 mmol/L (ref 3.5–5.1)
Sodium: 138 mmol/L (ref 135–145)

## 2023-07-22 LAB — CBC
HCT: 37.5 % (ref 36.0–46.0)
Hemoglobin: 11.9 g/dL — ABNORMAL LOW (ref 12.0–15.0)
MCH: 27.7 pg (ref 26.0–34.0)
MCHC: 31.7 g/dL (ref 30.0–36.0)
MCV: 87.4 fL (ref 80.0–100.0)
Platelets: 215 10*3/uL (ref 150–400)
RBC: 4.29 MIL/uL (ref 3.87–5.11)
RDW: 15.7 % — ABNORMAL HIGH (ref 11.5–15.5)
WBC: 10.9 10*3/uL — ABNORMAL HIGH (ref 4.0–10.5)
nRBC: 0 % (ref 0.0–0.2)

## 2023-07-22 LAB — GLUCOSE, CAPILLARY
Glucose-Capillary: 243 mg/dL — ABNORMAL HIGH (ref 70–99)
Glucose-Capillary: 225 mg/dL — ABNORMAL HIGH (ref 70–99)
Glucose-Capillary: 247 mg/dL — ABNORMAL HIGH (ref 70–99)

## 2023-07-22 MED ORDER — DOXYCYCLINE HYCLATE 100 MG PO TABS
100.0000 mg | ORAL_TABLET | Freq: Two times a day (BID) | ORAL | Status: DC
  Administered 2023-07-22 – 2023-07-23 (×3): 100 mg via ORAL
  Filled 2023-07-22 (×3): qty 1

## 2023-07-22 MED ORDER — LACTATED RINGERS IV BOLUS
1000.0000 mL | Freq: Once | INTRAVENOUS | Status: AC
  Administered 2023-07-22: 1000 mL via INTRAVENOUS

## 2023-07-22 NOTE — Assessment & Plan Note (Signed)
07-22-2023 A1c 10.2% earlier this month. Poor outpatient control. F/u with PCP for glycemic control. On Jardiance and Tresiba. May need mealtime and SSI added as outpatient.  07-23-2023 pt needs f/u with PCP for improved CBG control as outpatient.

## 2023-07-22 NOTE — Assessment & Plan Note (Addendum)
07-22-2023 had amputation yesterday. Podiatry wants 7 more days of abx with augmentin. No acute medical reason for continued hospitalization. Awaiting discharge clearance by podiatry  07-23-2023 stable for DC today. Home with augmentin and doxycycline per podiatry recs. F/u with podiatry. Keep dressing intact until seen by podiatry. WBAT on left foot.

## 2023-07-22 NOTE — Progress Notes (Signed)
TRH night cross cover note:   I was notified by RN that this this patient has been pressure is now slightly lower, most recently 80/51, down slightly from most recent prior value of 100/60 around 1500 today.  She reports some mild dizziness, but is otherwise asymptomatic at this time.  Heart rates in the 80s, temperature 98.1, and maintaining oxygen saturations in the high 90s to 100% on room air.  It appears that she is here with infected diabetic foot wound complicated by osteomyelitis, on antibiotics.  Per my discussions with the patient's RN, the patient has not received any recent pain medications.  I subsequently placed order for a 1 L LR bolus.     Newton Pigg, DO Hospitalist

## 2023-07-22 NOTE — Plan of Care (Signed)

## 2023-07-22 NOTE — Hospital Course (Signed)
HPI: [name] is a 63 y.o. female with medical history significant for obesity, type 2 diabetes, hypertension, hyperlipidemia, diabetic polyneuropathy, GERD, who presented to the ER, sent from her podiatrist office due to concern for left great toe osteomyelitis.  The patient initially presented at her podiatrist's office today for evaluation of left big toe ulcer worsening for the past week.  Associated with subjective fevers and chills and overall not feeling well.  On exam she was noted to have full-thickness ulceration to the left hallux with clear drainage.  The patient had an x-ray done at the podiatrist office which showed findings suggestive of osteomyelitis.  She was advised to go to the ER for further evaluation.   In the ER, MRI left foot confirmed osteomyelitis of the left great toe.  Per podiatry, plan for amputation on 07/21/2023.  Recommended to hold off on any further antibiotics if the patient is hemodynamically stable.  Will benefit from a bone biopsy for ID and sensitivities.   Admitted by [facility], hospitalist service.  Significant Events: Admitted 07/20/2023 for left 1st toe osteomyelitis   Significant Labs: glucose 158, BUN 24, creatinine 0.90. CRP 2.1. Sed rate 63. WBC 12.7. Hemoglobin 13.3.   Significant Imaging Studies: MRI left foot Soft tissue ulceration at the first amputation stump with acute osteomyelitis of the first proximal phalanx. No abscess. 2. Prior partial amputation of the second and third toes. Focal marrow edema in the residual distal third middle phalanx with overlying soft tissue swelling, suspicious for early osteomyelitis. Correlate for ulcer on physical exam.  Antibiotic Therapy: Anti-infectives (From admission, onward)    Start     Dose/Rate Route Frequency Ordered Stop   07/21/23 1700  vancomycin (VANCOREADY) IVPB 750 mg/150 mL  Status:  Discontinued        750 mg 150 mL/hr over 60 Minutes Intravenous Every 24 hours 07/20/23 2058 07/20/23 2111    07/21/23 1700  metroNIDAZOLE (FLAGYL) IVPB 500 mg  Status:  Discontinued        500 mg 100 mL/hr over 60 Minutes Intravenous Every 12 hours 07/20/23 2117 07/21/23 1602   07/21/23 1700  ceFEPIme (MAXIPIME) 2 g in sodium chloride 0.9 % 100 mL IVPB  Status:  Discontinued        2 g 200 mL/hr over 30 Minutes Intravenous Every 8 hours 07/20/23 2117 07/21/23 1602   07/21/23 1700  amoxicillin-clavulanate (AUGMENTIN) 875-125 MG per tablet 1 tablet        1 tablet Oral Every 12 hours 07/21/23 1602 07/28/23 2159   07/21/23 0700  metroNIDAZOLE (FLAGYL) IVPB 500 mg  Status:  Discontinued        500 mg 100 mL/hr over 60 Minutes Intravenous Every 12 hours 07/20/23 2049 07/20/23 2116   07/21/23 0300  ceFEPIme (MAXIPIME) 2 g in sodium chloride 0.9 % 100 mL IVPB  Status:  Discontinued        2 g 200 mL/hr over 30 Minutes Intravenous Every 8 hours 07/20/23 2049 07/20/23 2116   07/20/23 2117  vancomycin variable dose per unstable renal function (pharmacist dosing)  Status:  Discontinued         Does not apply See admin instructions 07/20/23 2117 07/21/23 1603   07/20/23 1800  ceFEPIme (MAXIPIME) 2 g in sodium chloride 0.9 % 100 mL IVPB        2 g 200 mL/hr over 30 Minutes Intravenous  Once 07/20/23 1749 07/20/23 2107   07/20/23 1800  metroNIDAZOLE (FLAGYL) IVPB 500 mg  500 mg 100 mL/hr over 60 Minutes Intravenous  Once 07/20/23 1749 07/20/23 1951   07/20/23 1800  vancomycin (VANCOREADY) IVPB 2000 mg/400 mL        2,000 mg 200 mL/hr over 120 Minutes Intravenous  Once 07/20/23 1753 07/20/23 2145       Procedures: 07/21/2023 amputation left 1st toe  Consultants: podiatry

## 2023-07-22 NOTE — Assessment & Plan Note (Signed)
07-22-2023 stable. On lopressor 25 mg bid, lisinopril 2.5 mg every day, imdur 15 mg qday, lasilx 20 mg day. F/u with PCP for HTN management.  07-23-2023 pt running low yesterday. Will hold lisinopril, lasix and imdur at discharge until seen by cardiology this week. Continue lopressor 25 mg bid with hold order(SBP <120 or HR <60)

## 2023-07-22 NOTE — Assessment & Plan Note (Signed)
07-22-2023 continue imdur and ranexa. On statin. She should also be on a ASA 81 mg daily. Don't see this on her meds list. Pt needs to f/u with cardiology.  07-23-2023 continue ranexa. Hold imdur due to low BP. Add asa 81 mg daily. Continue statin.

## 2023-07-22 NOTE — Subjective & Objective (Addendum)
Pt seen and examined. Had left great toe amputated yesterday due to acute osteomyelitis. Awaiting podiatry f/u. Placed on 7 days of augmentin per podiatry WBAT in surgery shoe. Pt lives in single floor apartment. Had help with family. Has already been up to bathroom. Has walker at home. Awaiting PT/OT to see her. Pt is able to go home if allowable by podiatry.

## 2023-07-22 NOTE — Evaluation (Signed)
Occupational Therapy Evaluation Patient Details Name: [name] MRN: [id number] DOB: 1960-11-03 Today's Date: 07/22/2023   History of Present Illness 63 y.o. female admitted 1/23 with left great toe osteomyelitis, s/p Amputation of great toe at MPJ level, Left foot.  history of coronary artery disease, CHF, diabetes, hypertension, hyperlipidemia.   Clinical Impression   Pt has assist from an aide at home daily in the mornings, ambulatory with Rollator as needed, has assist for light housekeeping tasks and bathing from aide. Pt currently needing set up - min A for ADL, mod I for bed mobility and CGA for transfers with RW. Pt educated on WB precautions and need for post op shoe, pt elevating LLE at end of session. Pt presenting with impairments listed below, will follow acutely. Recommend HHOT at d/c.        If plan is discharge home, recommend the following: A little help with walking and/or transfers;A little help with bathing/dressing/bathroom;Assistance with cooking/housework;Assist for transportation;Help with stairs or ramp for entrance    Functional Status Assessment  Patient has had a recent decline in their functional status and demonstrates the ability to make significant improvements in function in a reasonable and predictable amount of time.  Equipment Recommendations  BSC/3in1;Tub/shower seat;Other (comment) (RW)    Recommendations for Other Services PT consult     Precautions / Restrictions Precautions Precautions: Fall Required Braces or Orthoses: Other Brace Other Brace: Post op shoe Lt OOB Restrictions Weight Bearing Restrictions Per Provider Order: Yes LLE Weight Bearing Per Provider Order: Weight bearing as tolerated Other Position/Activity Restrictions: With post op shoe on      Mobility Bed Mobility Overal bed mobility: Modified Independent                  Transfers Overall transfer level: Needs assistance Equipment used: Rolling walker (2  wheels) Transfers: Sit to/from Stand Sit to Stand: Contact guard assist                  Balance Overall balance assessment: Needs assistance Sitting-balance support: No upper extremity supported, Feet supported Sitting balance-Leahy Scale: Good     Standing balance support: Single extremity supported Standing balance-Leahy Scale: Poor Standing balance comment: RW for support                           ADL either performed or assessed with clinical judgement   ADL Overall ADL's : Needs assistance/impaired Eating/Feeding: Set up   Grooming: Set up   Upper Body Bathing: Contact guard assist   Lower Body Bathing: Minimal assistance   Upper Body Dressing : Contact guard assist   Lower Body Dressing: Minimal assistance   Toilet Transfer: Contact guard assist;Ambulation;Rolling walker (2 wheels);Regular Toilet   Toileting- Clothing Manipulation and Hygiene: Contact guard assist       Functional mobility during ADLs: Contact guard assist;Rolling walker (2 wheels)       Vision   Additional Comments: disconjugate gaze, wears glasses     Perception Perception: Not tested       Praxis Praxis: Not tested       Pertinent Vitals/Pain Pain Assessment Pain Assessment: Faces Pain Score: 2  Faces Pain Scale: Hurts a little bit Pain Location: Lt foot op site Pain Descriptors / Indicators: Aching Pain Intervention(s): Limited activity within patient's tolerance, Monitored during session, Repositioned     Extremity/Trunk Assessment Upper Extremity Assessment Upper Extremity Assessment: Overall WFL for tasks assessed   Lower Extremity  Assessment Lower Extremity Assessment: Defer to PT evaluation   Cervical / Trunk Assessment Cervical / Trunk Assessment: Normal   Communication Communication Communication: No apparent difficulties   Cognition Arousal: Alert Behavior During Therapy: WFL for tasks assessed/performed Overall Cognitive Status: Within  Functional Limits for tasks assessed                                       General Comments  HR 90s with ambulation    Exercises     Shoulder Instructions      Home Living Family/patient expects to be discharged to:: Private residence Living Arrangements: Alone Available Help at Discharge: Family;Available PRN/intermittently;Personal care attendant Type of Home: Apartment Home Access: Level entry     Home Layout: One level     Bathroom Shower/Tub: Chief Strategy Officer: Handicapped height Bathroom Accessibility: Yes   Home Equipment: Rollator (4 wheels);Shower seat          Prior Functioning/Environment Prior Level of Function : Needs assist             Mobility Comments: rollator as needed ADLs Comments: aide assists with light housekeeping, bathing        OT Problem List: Decreased strength;Decreased range of motion;Impaired balance (sitting and/or standing);Decreased activity tolerance      OT Treatment/Interventions: Self-care/ADL training;Therapeutic exercise;Energy conservation;DME and/or AE instruction;Therapeutic activities;Patient/family education;Balance training    OT Goals(Current goals can be found in the care plan section) Acute Rehab OT Goals Patient Stated Goal: none stated OT Goal Formulation: With patient Time For Goal Achievement: 08/05/23 Potential to Achieve Goals: Good ADL Goals Pt Will Perform Lower Body Dressing: with supervision;sitting/lateral leans;sit to/from stand Pt Will Transfer to Toilet: ambulating;regular height toilet;Independently Pt Will Perform Tub/Shower Transfer: Shower transfer;Tub transfer;Independently;ambulating;shower seat;rolling walker  OT Frequency: Min 1X/week    Co-evaluation              AM-PAC OT "6 Clicks" Daily Activity     Outcome Measure Help from another person eating meals?: A Little Help from another person taking care of personal grooming?: A Little Help  from another person toileting, which includes using toliet, bedpan, or urinal?: A Little Help from another person bathing (including washing, rinsing, drying)?: A Little Help from another person to put on and taking off regular upper body clothing?: A Little Help from another person to put on and taking off regular lower body clothing?: A Little 6 Click Score: 18   End of Session Equipment Utilized During Treatment: Gait belt;Rolling walker (2 wheels) Nurse Communication: Mobility status  Activity Tolerance: Patient tolerated treatment well Patient left: in bed;with call bell/phone within reach;with bed alarm set;with family/visitor present  OT Visit Diagnosis: Unsteadiness on feet (R26.81);Other abnormalities of gait and mobility (R26.89);Muscle weakness (generalized) (M62.81)                Time: 0102-7253 OT Time Calculation (min): 22 min Charges:  OT General Charges $OT Visit: 1 Visit OT Evaluation $OT Eval Low Complexity: 1 Low  [name], OTD, OTR/L SecureChat Preferred Acute Rehab [phone]   [name] 07/22/2023, 3:44 PM

## 2023-07-22 NOTE — Progress Notes (Signed)
PODIATRY PROGRESS NOTE  NAME [name] MRN [id number] DOB Nov 06, 1960 DOA 07/20/2023   Reason for consult:  Chief Complaint  Patient presents with   Osteomyelitis   07/21/23: Left great toe amputation MPJ level with Dr. Annamary Rummage  History of present illness: 63 y.o. female seen resting comfortably at bedside.  Podiatry following for left great toe osteomyelitis.  Underwent first toe amputation on 1/24. Pain is well controlled. Surgical cultures pending.  Vitals:   07/21/23 2335 07/22/23 0610  BP: (!) 104/59 135/68  Pulse: 94 91  Resp:  19  Temp:  98.2 F (36.8 C)  SpO2: 100% 100%       Latest Ref Rng & Units 07/22/2023    7:48 AM 07/21/2023    8:17 AM 07/20/2023    7:56 PM  CBC  WBC 4.0 - 10.5 K/uL 10.9  11.6    Hemoglobin 12.0 - 15.0 g/dL 40.1  02.7  25.3   Hematocrit 36.0 - 46.0 % 37.5  35.8  39.0   Platelets 150 - 400 K/uL 215  245         Latest Ref Rng & Units 07/22/2023    7:48 AM 07/21/2023    7:17 AM 07/20/2023    7:56 PM  BMP  Glucose 70 - 99 mg/dL 664  403  474   BUN 8 - 23 mg/dL 15  17  24    Creatinine 0.44 - 1.00 mg/dL 2.59  5.63  8.75   Sodium 135 - 145 mmol/L 138  136  138   Potassium 3.5 - 5.1 mmol/L 3.5  4.0  4.3   Chloride 98 - 111 mmol/L 104  106  105   CO2 22 - 32 mmol/L 22  18    Calcium 8.9 - 10.3 mg/dL 9.2  8.9        Physical Exam: General: AOD x 3, no acute distress  Left lower extremity focused physical exam: Left foot dressing is clean dry and intact.  No evidence of strikethrough.  No ascending erythema or streaking lymphangitis Capillary refill intact to the digits.  Foot appears warm and well-perfused.  No cyanosis or pallor to the digits. Digital dexterity intact.  Epicritic sensation grossly intact. Negative Homans' sign    ASSESSMENT/PLAN OF CARE [name] is a 63 year old female followed by podiatry status post left hallux amputation 1/24 due to great toe osteomyelitis. Past medical history significant for  obesity, type 2 diabetes, hypertension, hyperlipidemia, diabetic polyneuropathy, GERD   Plan: -Left foot dressing left clean dry and intact today -May weight-bear as tolerated favoring the heel in surgical shoe with walker assistance -Pain control per primary team -Surgical cultures pending.  Surgical pathology pending. Anticipate clean margins -Antibiotics: P.o. Augmentin twice daily for 7 days.  Recommend adding 10 days of p.o. doxycycline for MRSA coverage pending cultures. - Stable for discharge from a podiatric standpoint. Per primary may benefit from another session with PT due to becoming orthostatic with weight bearing. Will change dressing tomorrow if patient is in house. - Patient may follow up with Dr. Annamary Rummage or Dr. Ardelle Anton within one week of discharge. Will manage dressing changes in office.   Please contact me directly with any questions or concerns.     Bronwen Betters, DPM [facility]  Dr. Bronwen Betters, DPM    [address]  [place], [place] [address]                Office [phone]  Fax [phone]

## 2023-07-22 NOTE — Progress Notes (Signed)
PROGRESS NOTE    [name]  BMW:[id number] DOB: Jun 27, 1961 DOA: 07/20/2023 PCP: Raymon Mutton., FNP  Subjective: Pt seen and examined. Had left great toe amputated yesterday due to acute osteomyelitis. Awaiting podiatry f/u. Placed on 7 days of augmentin per podiatry WBAT in surgery shoe. Pt lives in single floor apartment. Had help with family. Has already been up to bathroom. Has walker at home. Awaiting PT/OT to see her. Pt is able to go home if allowable by podiatry.    Hospital Course: HPI: [name] is a 63 y.o. female with medical history significant for obesity, type 2 diabetes, hypertension, hyperlipidemia, diabetic polyneuropathy, GERD, who presented to the ER, sent from her podiatrist office due to concern for left great toe osteomyelitis.  The patient initially presented at her podiatrist's office today for evaluation of left big toe ulcer worsening for the past week.  Associated with subjective fevers and chills and overall not feeling well.  On exam she was noted to have full-thickness ulceration to the left hallux with clear drainage.  The patient had an x-ray done at the podiatrist office which showed findings suggestive of osteomyelitis.  She was advised to go to the ER for further evaluation.   In the ER, MRI left foot confirmed osteomyelitis of the left great toe.  Per podiatry, plan for amputation on 07/21/2023.  Recommended to hold off on any further antibiotics if the patient is hemodynamically stable.  Will benefit from a bone biopsy for ID and sensitivities.   Admitted by [name], hospitalist service.  Significant Events: Admitted 07/20/2023 for left 1st toe osteomyelitis   Significant Labs: glucose 158, BUN 24, creatinine 0.90. CRP 2.1. Sed rate 63. WBC 12.7. Hemoglobin 13.3.   Significant Imaging Studies: MRI left foot Soft tissue ulceration at the first amputation stump with acute osteomyelitis of the first proximal phalanx. No abscess. 2. Prior  partial amputation of the second and third toes. Focal marrow edema in the residual distal third middle phalanx with overlying soft tissue swelling, suspicious for early osteomyelitis. Correlate for ulcer on physical exam.  Antibiotic Therapy: Anti-infectives (From admission, onward)    Start     Dose/Rate Route Frequency Ordered Stop   07/21/23 1700  vancomycin (VANCOREADY) IVPB 750 mg/150 mL  Status:  Discontinued        750 mg 150 mL/hr over 60 Minutes Intravenous Every 24 hours 07/20/23 2058 07/20/23 2111   07/21/23 1700  metroNIDAZOLE (FLAGYL) IVPB 500 mg  Status:  Discontinued        500 mg 100 mL/hr over 60 Minutes Intravenous Every 12 hours 07/20/23 2117 07/21/23 1602   07/21/23 1700  ceFEPIme (MAXIPIME) 2 g in sodium chloride 0.9 % 100 mL IVPB  Status:  Discontinued        2 g 200 mL/hr over 30 Minutes Intravenous Every 8 hours 07/20/23 2117 07/21/23 1602   07/21/23 1700  amoxicillin-clavulanate (AUGMENTIN) 875-125 MG per tablet 1 tablet        1 tablet Oral Every 12 hours 07/21/23 1602 07/28/23 2159   07/21/23 0700  metroNIDAZOLE (FLAGYL) IVPB 500 mg  Status:  Discontinued        500 mg 100 mL/hr over 60 Minutes Intravenous Every 12 hours 07/20/23 2049 07/20/23 2116   07/21/23 0300  ceFEPIme (MAXIPIME) 2 g in sodium chloride 0.9 % 100 mL IVPB  Status:  Discontinued        2 g 200 mL/hr over 30 Minutes Intravenous Every 8 hours  07/20/23 2049 07/20/23 2116   07/20/23 2117  vancomycin variable dose per unstable renal function (pharmacist dosing)  Status:  Discontinued         Does not apply See admin instructions 07/20/23 2117 07/21/23 1603   07/20/23 1800  ceFEPIme (MAXIPIME) 2 g in sodium chloride 0.9 % 100 mL IVPB        2 g 200 mL/hr over 30 Minutes Intravenous  Once 07/20/23 1749 07/20/23 2107   07/20/23 1800  metroNIDAZOLE (FLAGYL) IVPB 500 mg        500 mg 100 mL/hr over 60 Minutes Intravenous  Once 07/20/23 1749 07/20/23 1951   07/20/23 1800  vancomycin (VANCOREADY) IVPB  2000 mg/400 mL        2,000 mg 200 mL/hr over 120 Minutes Intravenous  Once 07/20/23 1753 07/20/23 2145       Procedures: 07/21/2023 amputation left 1st toe  Consultants: podiatry    Assessment and Plan: * Osteomyelitis of great toe of left foot (HCC) 07-22-2023 had amputation yesterday. Podiatry wants 7 more days of abx with augmentin. No acute medical reason for continued hospitalization. Awaiting discharge clearance by podiatry  S/P amputation of lesser toe, left (HCC) - Great Toe on 07-21-2023 07-22-2023 f/u with podiatry. Keep dressing intact until seen by podiatry. No acute medical reason for continued hospitalization. Awaiting discharge clearance by podiatry  HFmrEF (heart failure with mildly reduced ejection fraction) (HCC) 07-22-2023 remains on lopressor 25 mg bid.  Should be converted to Toprol-XL 50 mg qday if using GDMT as treatment plan. Continue with lasix 20 mg day, lisinopril 2.5 mg day.  F/u with cardiology to consider initiation of low dose Entresto.  Essential hypertension 07-22-2023 stable. On lopressor 25 mg bid, lisinopril 2.5 mg every day, imdur 15 mg qday, lasilx 20 mg day. F/u with PCP for HTN management.  Uncontrolled type 2 diabetes mellitus with hyperglycemia (HCC) [phone] A1c 10.2% earlier this month. Poor outpatient control. F/u with PCP for glycemic control. On Jardiance and Tresiba. May need mealtime and SSI added as outpatient.  CAD (coronary artery disease) 07-22-2023 continue imdur and ranexa. On statin. She should also be on a ASA 81 mg daily. Don't see this on her meds list. Pt needs to f/u with cardiology.  Obesity, Class II, BMI 35-39.9 07-22-2023 BMI 37.04  Hyperlipidemia 07-22-2023 continue with crestor 40 mg at bedtime.       DVT prophylaxis: heparin injection 5,000 Units Start: 07/20/23 2200    Code Status: Full Code Family Communication: no family at bedside. Pt is decisional Disposition Plan: return home Reason for  continuing need for hospitalization: medically stable from my point of view for discharge. Awaiting discharge clearance from podiatry.  Objective: Vitals:   07/21/23 2056 07/21/23 2335 07/22/23 0500 07/22/23 0610  BP: (!) 157/67 (!) 104/59  135/68  Pulse: 94 94  91  Resp: 19   19  Temp: 98.6 F (37 C)   98.2 F (36.8 C)  TempSrc: Oral   Oral  SpO2: 100% 100%  100%  Weight:   104.1 kg   Height:        Intake/Output Summary (Last 24 hours) at 07/22/2023 0906 Last data filed at 07/21/2023 1254 Gross per 24 hour  Intake --  Output 5 ml  Net -5 ml   Filed Weights   07/21/23 0500 07/21/23 1047 07/22/23 0500  Weight: 105.2 kg 105.2 kg 104.1 kg    Examination:  Physical Exam Vitals and nursing note reviewed.  Constitutional:  Appearance: She is obese.  HENT:     Head: Normocephalic and atraumatic.     Nose: Nose normal.  Eyes:     General: No scleral icterus. Cardiovascular:     Rate and Rhythm: Normal rate and regular rhythm.  Pulmonary:     Effort: Pulmonary effort is normal.     Breath sounds: Normal breath sounds.  Abdominal:     General: Abdomen is flat. Bowel sounds are normal.  Musculoskeletal:     Comments: Left foot bandaged in bulky dressing  Skin:    General: Skin is warm and dry.     Capillary Refill: Capillary refill takes less than 2 seconds.  Neurological:     Mental Status: She is alert and oriented to person, place, and time.     Data Reviewed: I have personally reviewed following labs and imaging studies  CBC: Recent Labs  Lab 07/20/23 1820 07/20/23 1956 07/21/23 0817 07/22/23 0748  WBC 12.7*  --  11.6* 10.9*  NEUTROABS 8.0*  --   --   --   HGB 12.2 13.3 11.4* 11.9*  HCT 39.9 39.0 35.8* 37.5  MCV 89.7  --  87.5 87.4  PLT 247  --  245 215   Basic Metabolic Panel: Recent Labs  Lab 07/20/23 1956 07/21/23 0717 07/22/23 0748  NA 138 136 138  K 4.3 4.0 3.5  CL 105 106 104  CO2  --  18* 22  GLUCOSE 158* 172* 171*  BUN 24* 17 15   CREATININE 0.90 1.17* 1.03*  CALCIUM  --  8.9 9.2  MG  --  2.0  --   PHOS  --  3.1  --    GFR: Estimated Creatinine Clearance: 67.2 mL/min (A) (by C-G formula based on SCr of 1.03 mg/dL (H)). CBG: Recent Labs  Lab 07/20/23 2227 07/21/23 0817 07/21/23 1244 07/21/23 1604 07/21/23 2057  GLUCAP 246* 182* 112* 193* 235*    Recent Results (from the past 240 hours)  Blood culture (routine x 2)     Status: None (Preliminary result)   Collection Time: 07/20/23  6:08 PM   Specimen: BLOOD LEFT FOREARM  Result Value Ref Range Status   Specimen Description BLOOD LEFT FOREARM  Final   Special Requests   Final    BOTTLES DRAWN AEROBIC AND ANAEROBIC Blood Culture results may not be optimal due to an inadequate volume of blood received in culture bottles   Culture   Final    NO GROWTH 2 DAYS Performed at [facility], [address], [place], [place] [address]    Report Status PENDING  Incomplete  Blood culture (routine x 2)     Status: None (Preliminary result)   Collection Time: 07/20/23  6:20 PM   Specimen: BLOOD  Result Value Ref Range Status   Specimen Description BLOOD LEFT ANTECUBITAL  Final   Special Requests   Final    BOTTLES DRAWN AEROBIC AND ANAEROBIC Blood Culture adequate volume   Culture   Final    NO GROWTH 2 DAYS Performed at [facility], [address], [place], [place] [address]    Report Status PENDING  Incomplete  Aerobic/Anaerobic Culture w Gram Stain (surgical/deep wound)     Status: None (Preliminary result)   Collection Time: 07/21/23 12:39 PM   Specimen: Toe, Left; Amputation  Result Value Ref Range Status   Specimen Description TISSUE  Final   Special Requests LEFT GREAT TOE  Final   Gram Stain NO WBC  SEEN RARE GRAM POSITIVE COCCI IN PAIRS   Final   Culture   Final    NO GROWTH < 24 HOURS Performed at [facility], [address], [place], [place] [address]    Report Status PENDING  Incomplete     Radiology Studies: VAS [place] ABI  WITH/WO TBI Result Date: 07/20/2023  LOWER EXTREMITY DOPPLER STUDY Patient Name:  AHTZIRY SAATHOFF  Date of Exam:   07/20/2023 Medical Rec #: [id number]         Accession #:    [id number] Date of Birth: [birth year]          Patient Gender: F Patient Age:   63 years Exam Location:  [facility] Procedure:      VAS [place] ABI WITH/WO TBI Referring Phys: Carlena Hurl --------------------------------------------------------------------------------  Indications: Ulceration, and gangrene. High Risk Factors: Hypertension, hyperlipidemia, Diabetes, past history of                    smoking, prior MI, coronary artery disease. Other Factors: CHF.  Comparison Study: Previous exam was on 01/22/2020 Performing Technologist: Ernestene Mention RVT, RDMS  Examination Guidelines: A complete evaluation includes at minimum, Doppler waveform signals and systolic blood pressure reading at the level of bilateral brachial, anterior tibial, and posterior tibial arteries, when vessel segments are accessible. Bilateral testing is considered an integral part of a complete examination. Photoelectric Plethysmograph (PPG) waveforms and toe systolic pressure readings are included as required and additional duplex testing as needed. Limited examinations for reoccurring indications may be performed as noted.  ABI Findings: +---------+------------------+-----+---------+--------+ Right    Rt Pressure (mmHg)IndexWaveform Comment  +---------+------------------+-----+---------+--------+ Brachial 177                    triphasic         +---------+------------------+-----+---------+--------+ PTA      186               1.05 triphasic         +---------+------------------+-----+---------+--------+ DP       209               1.18 triphasic         +---------+------------------+-----+---------+--------+ Great Toe188               1.06 Normal            +---------+------------------+-----+---------+--------+  +---------+------------------+-----+---------+-------------------+ Left     Lt Pressure (mmHg)IndexWaveform Comment             +---------+------------------+-----+---------+-------------------+ Brachial 167                    triphasic                    +---------+------------------+-----+---------+-------------------+ PTA      191               1.08 triphasic                    +---------+------------------+-----+---------+-------------------+ DP       191               1.08 triphasic                    +---------+------------------+-----+---------+-------------------+ Great Toe                                bandaged/open wound +---------+------------------+-----+---------+-------------------+ +-------+-----------+-----------+------------+------------+  ABI/TBIToday's ABIToday's TBIPrevious ABIPrevious TBI +-------+-----------+-----------+------------+------------+ Right  1.18       1.06       1.01        0.82         +-------+-----------+-----------+------------+------------+ Left   1.08                  0.96        0.93         +-------+-----------+-----------+------------+------------+  Unable to obtain TBI of LLE due to open wound and bandage.  Summary: Right: Resting right ankle-brachial index is within normal range. The right toe-brachial index is normal. Left: Resting left ankle-brachial index is within normal range. *See table(s) above for measurements and observations.     Preliminary    MR FOOT LEFT WO CONTRAST Result Date: 07/20/2023 CLINICAL DATA:  Left great toe infection. Prior peripheral toe amputations. EXAM: MRI OF THE LEFT FOOT WITHOUT CONTRAST TECHNIQUE: Multiplanar, multisequence MR imaging of the left forefoot was performed. No intravenous contrast was administered. COMPARISON:  Left foot x-rays from same day. MRI left foot dated Nov 06, 2022. FINDINGS: Bones/Joint/Cartilage Prominent marrow edema with corresponding decreased T1 marrow signal  involving the majority of the first proximal phalanx. Prior amputation of the first distal phalanx. Prior partial amputation of the second and third toes. Focal marrow edema in the residual distal third middle phalanx (series 8, image 19). No fracture or dislocation. Mild first MTP joint degenerative changes. No joint effusion. Ligaments Collateral ligaments are intact.  Lisfranc ligament is intact. Muscles and Tendons Flexor and extensor tendons are intact. No tenosynovitis. Increased T2 signal within and atrophy of the intrinsic muscles of the forefoot, nonspecific, but likely related to diabetic muscle changes. Soft tissue Soft tissue ulceration at the first amputation stump. No fluid collection. Soft tissue swelling at the tip of the residual third toe. No soft tissue mass. IMPRESSION: 1. Soft tissue ulceration at the first amputation stump with acute osteomyelitis of the first proximal phalanx. No abscess. 2. Prior partial amputation of the second and third toes. Focal marrow edema in the residual distal third middle phalanx with overlying soft tissue swelling, suspicious for early osteomyelitis. Correlate for ulcer on physical exam. Electronically Signed   By: Obie Dredge M.D.   On: 07/20/2023 13:12    Scheduled Meds:  amoxicillin-clavulanate  1 tablet Oral Q12H   dorzolamide  1 drop Both Eyes BID   empagliflozin  25 mg Oral q AM   furosemide  20 mg Oral Daily   heparin  5,000 Units Subcutaneous Q8H   insulin aspart  0-5 Units Subcutaneous QHS   insulin aspart  0-9 Units Subcutaneous TID WC   insulin glargine-yfgn  15 Units Subcutaneous QHS   isosorbide mononitrate  15 mg Oral Daily   lisinopril  2.5 mg Oral q AM   metoprolol tartrate  25 mg Oral BID   pantoprazole  40 mg Oral Daily   pregabalin  75 mg Oral BID   ranolazine  500 mg Oral BID   rosuvastatin  40 mg Oral QPM   Continuous Infusions:   LOS: 2 days   Time spent: 40 minutes  Carollee Herter, DO  [facility]  07/22/2023, 9:06 AM

## 2023-07-22 NOTE — Assessment & Plan Note (Signed)
07-22-2023 continue with crestor 40 mg at bedtime.

## 2023-07-22 NOTE — Evaluation (Signed)
Physical Therapy Evaluation Patient Details Name: [name] MRN: [id number] DOB: 11/21/1960 Today's Date: 07/22/2023  History of Present Illness  63 y.o. female admitted 1/23 with left great toe osteomyelitis, s/p Amputation of great toe at MPJ level, Left foot.  history of coronary artery disease, CHF, diabetes, hypertension, hyperlipidemia.  Clinical Impression  Patient is s/p above surgery resulting in functional limitations due to the deficits listed below (see PT Problem List). Has an aide 7x/week in the mornings at baseline who assists with IADLs and some ADLs as detailed below.) Uses a 4WW intermittently for gait, denies any falls. She was able to transfer and ambulate without physical assistance today but required a 2WW for stability, and post op shoe on Lt per orders. Pt did become dizzy while ambulating and needed to lie down fairly urgently to recover - BP was noted to 151/72 once supine, HR 100 (high of 114 while ambulating) and SpO2 100% on RA.  Daughter present and supportive, can also assist at home per pt. Will follow and progress until d/c. May benefit from HHPT follow-up for safety assessment and to ensure she is safely transitioning. Patient will benefit from acute skilled PT to increase their independence and safety with mobility to facilitate discharge.         If plan is discharge home, recommend the following: A little help with walking and/or transfers;A little help with bathing/dressing/bathroom;Assistance with cooking/housework;Assist for transportation   Can travel by private vehicle        Equipment Recommendations Rolling walker (2 wheels)  Recommendations for Other Services       Functional Status Assessment Patient has had a recent decline in their functional status and demonstrates the ability to make significant improvements in function in a reasonable and predictable amount of time.     Precautions / Restrictions Precautions Precautions:  Fall Required Braces or Orthoses: Other Brace Other Brace: Post op shoe Lt OOB Restrictions Weight Bearing Restrictions Per Provider Order: Yes LLE Weight Bearing Per Provider Order: Weight bearing as tolerated Other Position/Activity Restrictions: With post op shoe on      Mobility  Bed Mobility Overal bed mobility: Modified Independent             General bed mobility comments: extra time no assist    Transfers Overall transfer level: Needs assistance Equipment used: Rolling walker (2 wheels) Transfers: Sit to/from Stand Sit to Stand: Supervision           General transfer comment: Supervision for safety, extra time, effortful to rise from bed. No physical assist needed. Cues for technique    Ambulation/Gait Ambulation/Gait assistance: Supervision Gait Distance (Feet): 50 Feet Assistive device: Rolling walker (2 wheels) Gait Pattern/deviations: Step-to pattern, Decreased stride length, Knee flexed in stance - left, Ataxic Gait velocity: dec Gait velocity interpretation: <1.31 ft/sec, indicative of household ambulator   General Gait Details: Educated on safe AD use, RW adjusted for comfort and efficiency. Cues for sequencing, recommended WB through Lt hee primarily and to avoid getting to close to front of RW to prevent posterior instability. No buckling noted. A little beyond halfway she developed dizziness, needed to sit with some urgency, but able to continue ambulating back to bed.  Stairs            Wheelchair Mobility     Tilt Bed    Modified Rankin (Stroke Patients Only)       Balance Overall balance assessment: Needs assistance Sitting-balance support: No upper extremity supported, Feet supported  Sitting balance-Leahy Scale: Good     Standing balance support: Single extremity supported Standing balance-Leahy Scale: Poor Standing balance comment: RW for support                             Pertinent Vitals/Pain Pain  Assessment Pain Assessment: 0-10 Pain Score: 2  Pain Location: Lt foot op site Pain Descriptors / Indicators: Aching Pain Intervention(s): Monitored during session, Repositioned, Premedicated before session    Home Living Family/patient expects to be discharged to:: Private residence Living Arrangements: Alone Available Help at Discharge: Family;Available PRN/intermittently;Personal care attendant (daily aide [phone]) Type of Home: Apartment Home Access: Level entry       Home Layout: One level Home Equipment: Rollator (4 wheels);Shower seat      Prior Function Prior Level of Function : Needs assist             Mobility Comments: 4WW intermitttenly ADLs Comments: Aide cooks some, helps with shower, cleaning     Extremity/Trunk Assessment   Upper Extremity Assessment Upper Extremity Assessment: Defer to OT evaluation    Lower Extremity Assessment Lower Extremity Assessment: LLE deficits/detail;Generalized weakness LLE Deficits / Details: bandaged Lt foot       Communication   Communication Communication: No apparent difficulties  Cognition Arousal: Alert Behavior During Therapy: WFL for tasks assessed/performed Overall Cognitive Status: Within Functional Limits for tasks assessed                                          General Comments General comments (skin integrity, edema, etc.): Dizzy while walking, returned to supine with BP 151/72, HR 100 (high of 114 while ambulating) and SpO2 100% on RA. Team notified via secure chat. Symptoms improved. Educated on precautions. Able to donne post op shoe with effort, suggested modifications for ease and well received. Educated on LE elevation at rest.    Exercises General Exercises - Lower Extremity Ankle Circles/Pumps: Both, AROM, 15 reps, Supine Quad Sets: Strengthening, Both, 10 reps, Supine Gluteal Sets: Strengthening, Both, 10 reps, Supine   Assessment/Plan    PT Assessment Patient needs  continued PT services  PT Problem List Decreased strength;Decreased activity tolerance;Decreased balance;Decreased mobility;Decreased knowledge of use of DME;Decreased knowledge of precautions;Cardiopulmonary status limiting activity;Impaired sensation;Obesity;Pain       PT Treatment Interventions DME instruction;Gait training;Functional mobility training;Therapeutic activities;Therapeutic exercise;Balance training;Neuromuscular re-education;Patient/family education;Modalities    PT Goals (Current goals can be found in the Care Plan section)  Acute Rehab PT Goals Patient Stated Goal: Get well go home PT Goal Formulation: With patient Time For Goal Achievement: 08/04/23 Potential to Achieve Goals: Good    Frequency Min 1X/week     Co-evaluation               AM-PAC PT "6 Clicks" Mobility  Outcome Measure Help needed turning from your back to your side while in a flat bed without using bedrails?: None Help needed moving from lying on your back to sitting on the side of a flat bed without using bedrails?: None Help needed moving to and from a bed to a chair (including a wheelchair)?: A Little Help needed standing up from a chair using your arms (e.g., wheelchair or bedside chair)?: A Little Help needed to walk in hospital room?: A Little Help needed climbing 3-5 steps with a railing? : A Little 6 Click  Score: 20    End of Session Equipment Utilized During Treatment: Gait belt Activity Tolerance: Patient tolerated treatment well Patient left: in bed;with call bell/phone within reach;with bed alarm set Nurse Communication: Mobility status (Dizziness) PT Visit Diagnosis: Unsteadiness on feet (R26.81);Other abnormalities of gait and mobility (R26.89);Muscle weakness (generalized) (M62.81);Pain;Dizziness and giddiness (R42) Pain - Right/Left: Left Pain - part of body: Ankle and joints of foot    Time: 1610-9604 PT Time Calculation (min) (ACUTE ONLY): 30 min   Charges:   PT  Evaluation $PT Eval Low Complexity: 1 Low PT Treatments $Gait Training: 8-22 mins PT General Charges $$ ACUTE PT VISIT: 1 Visit         Kathlyn Sacramento, PT, DPT [facility]  Rehabilitation Services Physical Therapist Office: [phone] Website: [email or web address]   Berton Mount 07/22/2023, 10:15 AM

## 2023-07-22 NOTE — Plan of Care (Signed)
Pt alert x4, RA, ac/hs, continent, room air, x1 stand by

## 2023-07-22 NOTE — Assessment & Plan Note (Signed)
07-22-2023 BMI 37.04

## 2023-07-22 NOTE — Assessment & Plan Note (Addendum)
07-22-2023 f/u with podiatry. Keep dressing intact until seen by podiatry. No acute medical reason for continued hospitalization. Awaiting discharge clearance by podiatry  07-23-2023 stable for DC today. Home with augmentin and doxycycline per podiatry recs. F/u with podiatry. Keep dressing intact until seen by podiatry. WBAT on left foot.

## 2023-07-22 NOTE — Assessment & Plan Note (Signed)
07-22-2023 remains on lopressor 25 mg bid.  Should be converted to Toprol-XL 50 mg qday if using GDMT as treatment plan. Continue with lasix 20 mg day, lisinopril 2.5 mg day.  F/u with cardiology to consider initiation of low dose Entresto.  07-23-2023 pt running low yesterday. Will hold lisinopril, lasix and imdur at discharge until seen by cardiology this week. Continue lopressor 25 mg bid with hold order(SBP <120 or HR <60)

## 2023-07-23 DIAGNOSIS — Z89422 Acquired absence of other left toe(s): Secondary | ICD-10-CM | POA: Diagnosis not present

## 2023-07-23 DIAGNOSIS — I5020 Unspecified systolic (congestive) heart failure: Secondary | ICD-10-CM | POA: Diagnosis not present

## 2023-07-23 DIAGNOSIS — I10 Essential (primary) hypertension: Secondary | ICD-10-CM | POA: Diagnosis not present

## 2023-07-23 DIAGNOSIS — M869 Osteomyelitis, unspecified: Secondary | ICD-10-CM | POA: Diagnosis not present

## 2023-07-23 LAB — GLUCOSE, CAPILLARY: Glucose-Capillary: 249 mg/dL — ABNORMAL HIGH (ref 70–99)

## 2023-07-23 MED ORDER — OXYCODONE HCL 5 MG PO TABS: 5.0000 mg | ORAL_TABLET | Freq: Four times a day (QID) | ORAL | 0 refills | Status: DC | PRN

## 2023-07-23 MED ORDER — DOXYCYCLINE HYCLATE 100 MG PO TABS: 100.0000 mg | ORAL_TABLET | Freq: Two times a day (BID) | ORAL | 0 refills | Status: AC

## 2023-07-23 MED ORDER — ASPIRIN 81 MG PO TBEC: 81.0000 mg | DELAYED_RELEASE_TABLET | Freq: Every day | ORAL | Status: DC

## 2023-07-23 MED ORDER — METOPROLOL TARTRATE 25 MG PO TABS: 25.0000 mg | ORAL_TABLET | Freq: Two times a day (BID) | ORAL | 0 refills | Status: DC

## 2023-07-23 MED ORDER — AMOXICILLIN-POT CLAVULANATE 875-125 MG PO TABS: 1.0000 | ORAL_TABLET | Freq: Two times a day (BID) | ORAL | 0 refills | Status: AC

## 2023-07-23 NOTE — Progress Notes (Signed)
PODIATRY PROGRESS NOTE  NAME [name] MRN [id number] DOB [birth year] DOA 07/20/2023   Reason for consult:  Chief Complaint  Patient presents with   Osteomyelitis   07/21/23: Left great toe amputation MPJ level with Dr. Annamary Rummage  History of present illness: 63 y.o. female seen resting comfortably at bedside.  Podiatry following for left great toe osteomyelitis.  Underwent first toe amputation on 1/24. Pain is well controlled. Surgical cultures pending, no growth to date.  Vitals:   07/23/23 0547 07/23/23 0917  BP: 122/62   Pulse: 90 93  Resp: 19 15  Temp: 97.8 F (36.6 C) 98.3 F (36.8 C)  SpO2: 97% 98%       Latest Ref Rng & Units 07/22/2023    7:48 AM 07/21/2023    8:17 AM 07/20/2023    7:56 PM  CBC  WBC 4.0 - 10.5 K/uL 10.9  11.6    Hemoglobin 12.0 - 15.0 g/dL 78.2  95.6  21.3   Hematocrit 36.0 - 46.0 % 37.5  35.8  39.0   Platelets 150 - 400 K/uL 215  245         Latest Ref Rng & Units 07/22/2023    7:48 AM 07/21/2023    7:17 AM 07/20/2023    7:56 PM  BMP  Glucose 70 - 99 mg/dL 086  578  469   BUN 8 - 23 mg/dL 15  17  24    Creatinine 0.44 - 1.00 mg/dL 6.29  5.28  4.13   Sodium 135 - 145 mmol/L 138  136  138   Potassium 3.5 - 5.1 mmol/L 3.5  4.0  4.3   Chloride 98 - 111 mmol/L 104  106  105   CO2 22 - 32 mmol/L 22  18    Calcium 8.9 - 10.3 mg/dL 9.2  8.9        Physical Exam: General: AOD x 3, no acute distress  Left lower extremity focused physical exam:  Capillary refill intact to the digits.  Foot appears warm and well-perfused.  No cyanosis or pallor to the remaining digits. Sutures intact to incision site, skin edges well approximated. Dry scab around incision site. Some duskiness around central portion of incision Digital dexterity intact.  Epicritic sensation grossly intact. Negative Homans' sign   ASSESSMENT/PLAN OF CARE [name] is a 63 year old female followed by podiatry status post left hallux amputation 1/24 due to great toe  osteomyelitis. Past medical history significant for obesity, type 2 diabetes, hypertension, hyperlipidemia, diabetic polyneuropathy, GERD   Plan: -Surgical dressing changed today, xeroform to incision site, 4x4 gauze, kerlix, ace wrap applied. -May weight-bear as tolerated favoring the heel in surgical shoe with walker assistance -Most recent ABIs during admission demonstrated triphasic doppler signals, with normal pressure readings. -Pain control per primary team -Surgical cultures pending, no growth to date.  Surgical pathology pending. Anticipate clean margins -Antibiotics: P.o. Augmentin twice daily for 7 days.  Recommend adding 10 days of p.o. doxycycline for MRSA coverage pending cultures. - Stable for discharge from a podiatric standpoint. - Patient may follow up with Dr. Annamary Rummage or Dr. Ardelle Anton within one week of discharge. Will manage dressing changes in office.   Please contact me directly with any questions or concerns.     Bronwen Betters, DPM [facility]  Dr. Bronwen Betters, DPM    [address]  [place], [place] [address]                Office [phone]  Fax [phone]

## 2023-07-23 NOTE — Discharge Summary (Signed)
[facility] Phys[facility] Discharge Summary   Patient name: [name]  Admit date:     07/20/2023  Discharge date: 07/23/2023  Attending Physician: Darlin Drop [[id number]]  Discharge Physician: Carollee Herter   PCP: Raymon Mutton., FNP  Admitted From: Home  Disposition:  Home  Recommendations for Outpatient Follow-up:  Follow up with PCP in 1-2 weeks Follow up with cardiology as scheduled this week.  Please re-evaluate her HTN/CHF meds as her BP was low during her hospital stay and several HTN/CHF meds were placed on HOLD at time of discharge(lasix, lisinopril, imdur). Also her Lopressor dose was reduced to 25 mg bid due to low BP Follow up with podiatry this week for wound check Please follow up on the following pending results: intraoperative wound cultures.  Home Health:Yes: Home Health PT/OT Equipment/Devices: rolling walker potty chair shower chair    Discharge Condition:Stable CODE STATUS:FULL Diet recommendation: Diabetic Fluid Restriction: None  Hospital Summary: HPI: [name] is a 63 y.o. female with medical history significant for obesity, type 2 diabetes, hypertension, hyperlipidemia, diabetic polyneuropathy, GERD, who presented to the ER, sent from her podiatrist office due to concern for left great toe osteomyelitis.  The patient initially presented at her podiatrist's office today for evaluation of left big toe ulcer worsening for the past week.  Associated with subjective fevers and chills and overall not feeling well.  On exam she was noted to have full-thickness ulceration to the left hallux with clear drainage.  The patient had an x-ray done at the podiatrist office which showed findings suggestive of osteomyelitis.  She was advised to go to the ER for further evaluation.   In the ER, MRI left foot confirmed osteomyelitis of the left great toe.  Per podiatry, plan for amputation on 07/21/2023.  Recommended to hold off on any further antibiotics if  the patient is hemodynamically stable.  Will benefit from a bone biopsy for ID and sensitivities.   Admitted by [facility], hospitalist service.  Significant Events: Admitted 07/20/2023 for left 1st toe osteomyelitis   Significant Labs: glucose 158, BUN 24, creatinine 0.90. CRP 2.1. Sed rate 63. WBC 12.7. Hemoglobin 13.3.   Significant Imaging Studies: MRI left foot Soft tissue ulceration at the first amputation stump with acute osteomyelitis of the first proximal phalanx. No abscess. 2. Prior partial amputation of the second and third toes. Focal marrow edema in the residual distal third middle phalanx with overlying soft tissue swelling, suspicious for early osteomyelitis. Correlate for ulcer on physical exam.  Antibiotic Therapy: Anti-infectives (From admission, onward)    Start     Dose/Rate Route Frequency Ordered Stop   07/21/23 1700  vancomycin (VANCOREADY) IVPB 750 mg/150 mL  Status:  Discontinued        750 mg 150 mL/hr over 60 Minutes Intravenous Every 24 hours 07/20/23 2058 07/20/23 2111   07/21/23 1700  metroNIDAZOLE (FLAGYL) IVPB 500 mg  Status:  Discontinued        500 mg 100 mL/hr over 60 Minutes Intravenous Every 12 hours 07/20/23 2117 07/21/23 1602   07/21/23 1700  ceFEPIme (MAXIPIME) 2 g in sodium chloride 0.9 % 100 mL IVPB  Status:  Discontinued        2 g 200 mL/hr over 30 Minutes Intravenous Every 8 hours 07/20/23 2117 07/21/23 1602   07/21/23 1700  amoxicillin-clavulanate (AUGMENTIN) 875-125 MG per tablet 1 tablet        1 tablet Oral Every 12 hours 07/21/23 1602 07/28/23 2159  07/21/23 0700  metroNIDAZOLE (FLAGYL) IVPB 500 mg  Status:  Discontinued        500 mg 100 mL/hr over 60 Minutes Intravenous Every 12 hours 07/20/23 2049 07/20/23 2116   07/21/23 0300  ceFEPIme (MAXIPIME) 2 g in sodium chloride 0.9 % 100 mL IVPB  Status:  Discontinued        2 g 200 mL/hr over 30 Minutes Intravenous Every 8 hours 07/20/23 2049 07/20/23 2116   07/20/23 2117  vancomycin variable  dose per unstable renal function (pharmacist dosing)  Status:  Discontinued         Does not apply See admin instructions 07/20/23 2117 07/21/23 1603   07/20/23 1800  ceFEPIme (MAXIPIME) 2 g in sodium chloride 0.9 % 100 mL IVPB        2 g 200 mL/hr over 30 Minutes Intravenous  Once 07/20/23 1749 07/20/23 2107   07/20/23 1800  metroNIDAZOLE (FLAGYL) IVPB 500 mg        500 mg 100 mL/hr over 60 Minutes Intravenous  Once 07/20/23 1749 07/20/23 1951   07/20/23 1800  vancomycin (VANCOREADY) IVPB 2000 mg/400 mL        2,000 mg 200 mL/hr over 120 Minutes Intravenous  Once 07/20/23 1753 07/20/23 2145       Procedures: 07/21/2023 amputation left 1st toe  Consultants: podiatry   Hospital Course by Problem: * Osteomyelitis of great toe of left foot (HCC) 07-22-2023 had amputation yesterday. Podiatry wants 7 more days of abx with augmentin. No acute medical reason for continued hospitalization. Awaiting discharge clearance by podiatry  07-23-2023 stable for DC today. Home with augmentin and doxycycline per podiatry recs. F/u with podiatry. Keep dressing intact until seen by podiatry. WBAT on left foot.  S/P amputation of lesser toe, left (HCC) - Great Toe on 07-21-2023 07-22-2023 f/u with podiatry. Keep dressing intact until seen by podiatry. No acute medical reason for continued hospitalization. Awaiting discharge clearance by podiatry  07-23-2023 stable for DC today. Home with augmentin and doxycycline per podiatry recs. F/u with podiatry. Keep dressing intact until seen by podiatry. WBAT on left foot.   HFmrEF (heart failure with mildly reduced ejection fraction) (HCC) 07-22-2023 remains on lopressor 25 mg bid.  Should be converted to Toprol-XL 50 mg qday if using GDMT as treatment plan. Continue with lasix 20 mg day, lisinopril 2.5 mg day.  F/u with cardiology to consider initiation of low dose Entresto.  07-23-2023 pt running low yesterday. Will hold lisinopril, lasix and imdur at  discharge until seen by cardiology this week. Continue lopressor 25 mg bid with hold order(SBP <120 or HR <60)   Essential hypertension 07-22-2023 stable. On lopressor 25 mg bid, lisinopril 2.5 mg every day, imdur 15 mg qday, lasilx 20 mg day. F/u with PCP for HTN management.  07-23-2023 pt running low yesterday. Will hold lisinopril, lasix and imdur at discharge until seen by cardiology this week. Continue lopressor 25 mg bid with hold order(SBP <120 or HR <60)  Uncontrolled type 2 diabetes mellitus with hyperglycemia (HCC) 07-22-2023 A1c 10.2% earlier this month. Poor outpatient control. F/u with PCP for glycemic control. On Jardiance and Tresiba. May need mealtime and SSI added as outpatient.  07-23-2023 pt needs f/u with PCP for improved CBG control as outpatient.  CAD (coronary artery disease) 07-22-2023 continue imdur and ranexa. On statin. She should also be on a ASA 81 mg daily. Don't see this on her meds list. Pt needs to f/u with cardiology.  07-23-2023 continue ranexa. Hold  imdur due to low BP. Add asa 81 mg daily. Continue statin.  Obesity, Class II, BMI 35-39.9 07-22-2023 BMI 37.04  Hyperlipidemia 07-22-2023 continue with crestor 40 mg at bedtime.    Discharge Diagnoses:  Principal Problem:   Osteomyelitis of great toe of left foot (HCC) Active Problems:   S/P amputation of lesser toe, left (HCC) - Great Toe on 07-21-2023   Uncontrolled type 2 diabetes mellitus with hyperglycemia (HCC)   Essential hypertension   HFmrEF (heart failure with mildly reduced ejection fraction) (HCC)   Hyperlipidemia   Obesity, Class II, BMI 35-39.9   CAD (coronary artery disease)   Discharge Instructions  Discharge Instructions     Call MD for:  difficulty breathing, headache or visual disturbances   Complete by: As directed    Call MD for:  hives   Complete by: As directed    Call MD for:  persistant dizziness or light-headedness   Complete by: As directed    Call MD for:   persistant nausea and vomiting   Complete by: As directed    Call MD for:  redness, tenderness, or signs of infection (pain, swelling, redness, odor or green/yellow discharge around incision site)   Complete by: As directed    Call MD for:  severe uncontrolled pain   Complete by: As directed    Call MD for:  temperature >100.4   Complete by: As directed    Diet - low sodium heart healthy   Complete by: As directed    Discharge instructions   Complete by: As directed    1. Follow up with Primary Care Provider in 1 week for diabetes management. 2. Keep your scheduled appointment with cardiology this week. Have your cardiology provider re-assess your high blood pressure medications and restart them as your are able. 3. Call Podiatry Office tomorrow and schedule follow up appointment for this week.  Office # [phone]   Increase activity slowly   Complete by: As directed    Leave dressing on - Keep it clean, dry, and intact until clinic visit   Complete by: As directed       Allergies as of 07/23/2023   No Known Allergies      Medication List     PAUSE taking these medications    furosemide 20 MG tablet Wait to take this until your doctor or other care provider tells you to start again. Commonly known as: LASIX Take 20 mg by mouth 2 (two) times daily.   isosorbide mononitrate 60 MG 24 hr tablet Wait to take this until your doctor or other care provider tells you to start again. Commonly known as: IMDUR TAKE 1 TABLET EVERY DAY   lisinopril 2.5 MG tablet Wait to take this until your doctor or other care provider tells you to start again. Commonly known as: ZESTRIL TAKE 1 TABLET(2.5 MG) BY MOUTH DAILY       STOP taking these medications    EXCEDRIN PO   oxyCODONE-acetaminophen 5-325 MG tablet Commonly known as: PERCOCET/ROXICET   Santyl 250 UNIT/GM ointment Generic drug: collagenase   silver sulfADIAZINE 1 % cream Commonly known as: Silvadene       TAKE  these medications    amitriptyline 25 MG tablet Commonly known as: ELAVIL Take 1 tablet (25 mg total) by mouth at bedtime.   ammonium lactate 12 % cream Commonly known as: AMLACTIN Apply 1 Application topically as needed for dry skin.   amoxicillin-clavulanate 875-125 MG tablet Commonly known as: AUGMENTIN Take  1 tablet by mouth every 12 (twelve) hours for 7 days.   aspirin EC 81 MG tablet Take 1 tablet (81 mg total) by mouth daily. Swallow whole.   azelastine 0.1 % nasal spray Commonly known as: ASTELIN Place 1 spray into both nostrils daily as needed for allergies.   B-D SINGLE USE SWABS REGULAR Pads Use as directed   benzonatate 200 MG capsule Commonly known as: TESSALON Take 1 capsule (200 mg total) by mouth 3 (three) times daily as needed for cough.   clopidogrel 75 MG tablet Commonly known as: PLAVIX Take 1 tablet (75 mg total) by mouth daily. What changed: when to take this   Combigan 0.2-0.5 % ophthalmic solution Generic drug: brimonidine-timolol Place 1 drop into both eyes 2 (two) times daily.   Dexcom G6 Transmitter Misc 1 Device by Does not apply route as directed.   Dexcom G7 Receiver Devi Use to check blood sugars   Dexcom G7 Sensor Misc Change sensor every 10 days   diphenhydrAMINE 25 MG tablet Commonly known as: BENADRYL Take 1 tablet (25 mg total) by mouth every 6 (six) hours as needed for itching or allergies.   dorzolamide 2 % ophthalmic solution Commonly known as: TRUSOPT Place 1 drop into both eyes 2 (two) times daily.   doxycycline 100 MG tablet Commonly known as: VIBRA-TABS Take 1 tablet (100 mg total) by mouth every 12 (twelve) hours for 7 days. What changed: when to take this   empagliflozin 25 MG Tabs tablet Commonly known as: Jardiance Take 1 tablet (25 mg total) by mouth daily. What changed: when to take this   fluticasone 50 MCG/ACT nasal spray Commonly known as: Flonase Place 1 spray into both nostrils daily.   glucose  blood test strip Commonly known as: OneTouch Verio USE TO CHECK BLOOD SUGAR 4 TIMES DAILY AS DIRECTED   GNP Truetrack Test Strips test strip Generic drug: glucose blood Use as instructed   True Metrix Blood Glucose Test test strip Generic drug: glucose blood Use as instructed   GNP True Metrix Air Meter w/Device Kit 1 kit by Does not apply route 4 (four) times daily. E11.42   Guaifenesin 1200 MG Tb12 Commonly known as: Mucinex Maximum Strength Take 1 tablet (1,200 mg total) by mouth every 12 (twelve) hours as needed.   HYDROcodone-acetaminophen 5-325 MG tablet Commonly known as: NORCO/VICODIN Take 1-2 tablets by mouth every 6 (six) hours as needed.   insulin lispro 100 UNIT/ML KwikPen Commonly known as: HumaLOG KwikPen Max daily 60 units What changed:  how much to take how to take this additional instructions   Insulin Pen Needle 31G X 5 MM Misc 1 Device by Does not apply route in the morning, at noon, in the evening, and at bedtime.   metoprolol tartrate 25 MG tablet Commonly known as: LOPRESSOR Take 1 tablet (25 mg total) by mouth 2 (two) times daily. Hold for SBP <120 or HR <60 What changed:  medication strength how much to take additional instructions   montelukast 10 MG tablet Commonly known as: SINGULAIR Take 10 mg by mouth at bedtime as needed.   mupirocin ointment 2 % Commonly known as: BACTROBAN Apply 1 Application topically 2 (two) times daily.   nitroGLYCERIN 0.4 MG SL tablet Commonly known as: NITROSTAT DISSOLVE 1 TABLET (0.4 MG TOTAL) UNDER THE TONGUE EVERY 5 MINUTES AS NEEDED FOR CHEST PAIN. What changed: See the new instructions.   OneTouch Delica Lancets Fine Misc Check blood sugar as instructed up to 3 times  a day   TRUEplus Lancets 33G Misc Check 4 times a daily   oxyCODONE 5 MG immediate release tablet Commonly known as: Oxy IR/ROXICODONE Take 1 tablet (5 mg total) by mouth every 6 (six) hours as needed for up to 3 days for moderate  pain (pain score 4-6).   pantoprazole 40 MG tablet Commonly known as: PROTONIX Take 1 tablet (40 mg total) by mouth daily. What changed: when to take this   pregabalin 75 MG capsule Commonly known as: Lyrica Take 1 capsule (75 mg total) by mouth 2 (two) times daily.   ranolazine 500 MG 12 hr tablet Commonly known as: Ranexa Take 1 tablet (500 mg total) by mouth 2 (two) times daily.   Rocklatan 0.02-0.005 % Soln Generic drug: Netarsudil-Latanoprost Place 1 drop into both eyes at bedtime.   rosuvastatin 40 MG tablet Commonly known as: CRESTOR TAKE 1 TABLET(40 MG) BY MOUTH DAILY What changed: See the new instructions.   Semaglutide (2 MG/DOSE) 8 MG/3ML Sopn Inject 2 mg as directed once a week. What changed: additional instructions   Tresiba FlexTouch 100 UNIT/ML FlexTouch Pen Generic drug: insulin degludec Inject 44 Units into the skin daily. What changed: when to take this   True Metrix Level 1 Low Soln Use as directed               Durable Medical Equipment  (From admission, onward)           Start     Ordered   07/23/23 0750  For home use only DME 3 n 1  Once        07/23/23 0751   07/23/23 0750  For home use only DME Bedside commode  Once       Question:  Patient needs a bedside commode to treat with the following condition  Answer:  Debility   07/23/23 0751   07/23/23 0750  For home use only DME Walker rolling  Once       Question Answer Comment  Walker: With 5 Inch Wheels   Patient needs a walker to treat with the following condition Debility      07/23/23 0751              Discharge Care Instructions  (From admission, onward)           Start     Ordered   07/23/23 0000  Leave dressing on - Keep it clean, dry, and intact until clinic visit        07/23/23 0947            No Known Allergies  Discharge Exam: Vitals:   07/23/23 0547 07/23/23 0917  BP: 122/62   Pulse: 90 93  Resp: 19 15  Temp: 97.8 F (36.6 C) 98.3 F  (36.8 C)  SpO2: 97% 98%    Physical Exam Vitals and nursing note reviewed.  Constitutional:      General: She is not in acute distress.    Appearance: She is obese. She is not toxic-appearing or diaphoretic.  HENT:     Head: Normocephalic and atraumatic.     Nose: Nose normal.  Eyes:     General: No scleral icterus. Cardiovascular:     Rate and Rhythm: Normal rate and regular rhythm.  Pulmonary:     Effort: Pulmonary effort is normal.     Breath sounds: Normal breath sounds.  Abdominal:     General: Bowel sounds are normal.     Palpations: Abdomen is soft.  Musculoskeletal:     Comments: Left foot wrapped in bandage  Skin:    General: Skin is warm and dry.     Capillary Refill: Capillary refill takes less than 2 seconds.  Neurological:     Mental Status: She is alert and oriented to person, place, and time.     The results of significant diagnostics from this hospitalization (including imaging, microbiology, ancillary and laboratory) are listed below for reference.    Microbiology: Recent Results (from the past 240 hours)  Blood culture (routine x 2)     Status: None (Preliminary result)   Collection Time: 07/20/23  6:08 PM   Specimen: BLOOD LEFT FOREARM  Result Value Ref Range Status   Specimen Description BLOOD LEFT FOREARM  Final   Special Requests   Final    BOTTLES DRAWN AEROBIC AND ANAEROBIC Blood Culture results may not be optimal due to an inadequate volume of blood received in culture bottles   Culture   Final    NO GROWTH 3 DAYS Performed at [facility], [address] [place], [place] [address]    Report Status PENDING  Incomplete  Blood culture (routine x 2)     Status: None (Preliminary result)   Collection Time: 07/20/23  6:20 PM   Specimen: BLOOD  Result Value Ref Range Status   Specimen Description BLOOD LEFT ANTECUBITAL  Final   Special Requests   Final    BOTTLES DRAWN AEROBIC AND ANAEROBIC Blood Culture adequate volume   Culture    Final    NO GROWTH 3 DAYS Performed at [facility], [address] [place], [place] [address]    Report Status PENDING  Incomplete  Aerobic/Anaerobic Culture w Gram Stain (surgical/deep wound)     Status: None (Preliminary result)   Collection Time: 07/21/23 12:39 PM   Specimen: Toe, Left; Amputation  Result Value Ref Range Status   Specimen Description TISSUE  Final   Special Requests LEFT GREAT TOE  Final   Gram Stain NO WBC SEEN RARE GRAM POSITIVE COCCI IN PAIRS   Final   Culture   Final    NO GROWTH < 24 HOURS Performed at [facility], [address] [place], [place] [address]    Report Status PENDING  Incomplete     Labs: Basic Metabolic Panel: Recent Labs  Lab 07/20/23 1956 07/21/23 0717 07/22/23 0748  NA 138 136 138  K 4.3 4.0 3.5  CL 105 106 104  CO2  --  18* 22  GLUCOSE 158* 172* 171*  BUN 24* 17 15  CREATININE 0.90 1.17* 1.03*  CALCIUM  --  8.9 9.2  MG  --  2.0  --   PHOS  --  3.1  --    CBC: Recent Labs  Lab 07/20/23 1820 07/20/23 1956 07/21/23 0817 07/22/23 0748  WBC 12.7*  --  11.6* 10.9*  NEUTROABS 8.0*  --   --   --   HGB 12.2 13.3 11.4* 11.9*  HCT 39.9 39.0 35.8* 37.5  MCV 89.7  --  87.5 87.4  PLT 247  --  245 215   CBG: Recent Labs  Lab 07/21/23 2057 07/22/23 1216 07/22/23 1645 07/22/23 2054 07/23/23 0915  GLUCAP 235* 243* 225* 247* 249*   Lab Results  Component Value Date/Time   HGBA1C 10.2 (A) 07/12/2023 02:42 PM   HGBA1C 9.7 (H) 10/15/2015 08:41 AM     Sepsis Labs Recent Labs  Lab 07/20/23 1820 07/21/23 0817 07/22/23  0748  WBC 12.7* 11.6* 10.9*   Microbiology Recent Results (from the past 240 hours)  Blood culture (routine x 2)     Status: None (Preliminary result)   Collection Time: 07/20/23  6:08 PM   Specimen: BLOOD LEFT FOREARM  Result Value Ref Range Status   Specimen Description BLOOD LEFT FOREARM  Final   Special Requests   Final    BOTTLES DRAWN AEROBIC AND ANAEROBIC Blood Culture results  may not be optimal due to an inadequate volume of blood received in culture bottles   Culture   Final    NO GROWTH 3 DAYS Performed at [name], [address], [place], [place] [address]    Report Status PENDING  Incomplete  Blood culture (routine x 2)     Status: None (Preliminary result)   Collection Time: 07/20/23  6:20 PM   Specimen: BLOOD  Result Value Ref Range Status   Specimen Description BLOOD LEFT ANTECUBITAL  Final   Special Requests   Final    BOTTLES DRAWN AEROBIC AND ANAEROBIC Blood Culture adequate volume   Culture   Final    NO GROWTH 3 DAYS Performed at [facility], [address] [place], [place] [address]    Report Status PENDING  Incomplete  Aerobic/Anaerobic Culture w Gram Stain (surgical/deep wound)     Status: None (Preliminary result)   Collection Time: 07/21/23 12:39 PM   Specimen: Toe, Left; Amputation  Result Value Ref Range Status   Specimen Description TISSUE  Final   Special Requests LEFT GREAT TOE  Final   Gram Stain NO WBC SEEN RARE GRAM POSITIVE COCCI IN PAIRS   Final   Culture   Final    NO GROWTH < 24 HOURS Performed at [facility], [address], [place], [place] [address]    Report Status PENDING  Incomplete    Procedures/Studies: VAS [place] ABI WITH/WO TBI Result Date: 07/20/2023  LOWER EXTREMITY DOPPLER STUDY Patient Name:  COLIE JOSTEN  Date of Exam:   07/20/2023 Medical Rec #: [id number]         Accession #:    [id number] Date of Birth: 1961/04/24          Patient Gender: F Patient Age:   63 years Exam Location:  [facility] Procedure:      VAS [place] ABI WITH/WO TBI Referring Phys: Carlena Hurl --------------------------------------------------------------------------------  Indications: Ulceration, and gangrene. High Risk Factors: Hypertension, hyperlipidemia, Diabetes, past history of                    smoking, prior MI, coronary artery disease. Other Factors: CHF.  Comparison Study: Previous exam was  on 01/22/2020 Performing Technologist: Ernestene Mention RVT, RDMS  Examination Guidelines: A complete evaluation includes at minimum, Doppler waveform signals and systolic blood pressure reading at the level of bilateral brachial, anterior tibial, and posterior tibial arteries, when vessel segments are accessible. Bilateral testing is considered an integral part of a complete examination. Photoelectric Plethysmograph (PPG) waveforms and toe systolic pressure readings are included as required and additional duplex testing as needed. Limited examinations for reoccurring indications may be performed as noted.  ABI Findings: +---------+------------------+-----+---------+--------+ Right    Rt Pressure (mmHg)IndexWaveform Comment  +---------+------------------+-----+---------+--------+ Brachial 177                    triphasic         +---------+------------------+-----+---------+--------+ PTA      186  1.05 triphasic         +---------+------------------+-----+---------+--------+ DP       209               1.18 triphasic         +---------+------------------+-----+---------+--------+ Great Toe188               1.06 Normal            +---------+------------------+-----+---------+--------+ +---------+------------------+-----+---------+-------------------+ Left     Lt Pressure (mmHg)IndexWaveform Comment             +---------+------------------+-----+---------+-------------------+ Brachial 167                    triphasic                    +---------+------------------+-----+---------+-------------------+ PTA      191               1.08 triphasic                    +---------+------------------+-----+---------+-------------------+ DP       191               1.08 triphasic                    +---------+------------------+-----+---------+-------------------+ Great Toe                                bandaged/open wound  +---------+------------------+-----+---------+-------------------+ +-------+-----------+-----------+------------+------------+ ABI/TBIToday's ABIToday's TBIPrevious ABIPrevious TBI +-------+-----------+-----------+------------+------------+ Right  1.18       1.06       1.01        0.82         +-------+-----------+-----------+------------+------------+ Left   1.08                  0.96        0.93         +-------+-----------+-----------+------------+------------+  Unable to obtain TBI of LLE due to open wound and bandage.  Summary: Right: Resting right ankle-brachial index is within normal range. The right toe-brachial index is normal. Left: Resting left ankle-brachial index is within normal range. *See table(s) above for measurements and observations.     Preliminary    MR FOOT LEFT WO CONTRAST Result Date: 07/20/2023 CLINICAL DATA:  Left great toe infection. Prior peripheral toe amputations. EXAM: MRI OF THE LEFT FOOT WITHOUT CONTRAST TECHNIQUE: Multiplanar, multisequence MR imaging of the left forefoot was performed. No intravenous contrast was administered. COMPARISON:  Left foot x-rays from same day. MRI left foot dated Nov 06, 2022. FINDINGS: Bones/Joint/Cartilage Prominent marrow edema with corresponding decreased T1 marrow signal involving the majority of the first proximal phalanx. Prior amputation of the first distal phalanx. Prior partial amputation of the second and third toes. Focal marrow edema in the residual distal third middle phalanx (series 8, image 19). No fracture or dislocation. Mild first MTP joint degenerative changes. No joint effusion. Ligaments Collateral ligaments are intact.  Lisfranc ligament is intact. Muscles and Tendons Flexor and extensor tendons are intact. No tenosynovitis. Increased T2 signal within and atrophy of the intrinsic muscles of the forefoot, nonspecific, but likely related to diabetic muscle changes. Soft tissue Soft tissue ulceration at the first  amputation stump. No fluid collection. Soft tissue swelling at the tip of the residual third toe. No soft tissue mass. IMPRESSION: 1. Soft tissue ulceration at the first amputation stump with acute  osteomyelitis of the first proximal phalanx. No abscess. 2. Prior partial amputation of the second and third toes. Focal marrow edema in the residual distal third middle phalanx with overlying soft tissue swelling, suspicious for early osteomyelitis. Correlate for ulcer on physical exam. Electronically Signed   By: Obie Dredge M.D.   On: 07/20/2023 13:12    Time coordinating discharge: 45 mins  SIGNED:  Carollee Herter, DO [facility] 07/23/23, 9:47 AM

## 2023-07-23 NOTE — Plan of Care (Signed)

## 2023-07-23 NOTE — Progress Notes (Signed)
PROGRESS NOTE    [name]  VOZ:[id number] DOB: [birth year] DOA: 07/20/2023 PCP: Raymon Mutton., FNP  Subjective: Pt seen and examined.  Discussed with podiatry yesterday. Have added doxycyline to abx regimen. Pt to f/u with podiatry this week.  Will hold some CHF/HTN meds due to low BP. Pt has f/u appointment with cardiology on 07-25-2023. Cards can restart meds if BP has improved.   Hospital Course: HPI: [name] is a 63 y.o. female with medical history significant for obesity, type 2 diabetes, hypertension, hyperlipidemia, diabetic polyneuropathy, GERD, who presented to the ER, sent from her podiatrist office due to concern for left great toe osteomyelitis.  The patient initially presented at her podiatrist's office today for evaluation of left big toe ulcer worsening for the past week.  Associated with subjective fevers and chills and overall not feeling well.  On exam she was noted to have full-thickness ulceration to the left hallux with clear drainage.  The patient had an x-ray done at the podiatrist office which showed findings suggestive of osteomyelitis.  She was advised to go to the ER for further evaluation.   In the ER, MRI left foot confirmed osteomyelitis of the left great toe.  Per podiatry, plan for amputation on 07/21/2023.  Recommended to hold off on any further antibiotics if the patient is hemodynamically stable.  Will benefit from a bone biopsy for ID and sensitivities.   Admitted by [facility], hospitalist service.  Significant Events: Admitted 07/20/2023 for left 1st toe osteomyelitis   Significant Labs: glucose 158, BUN 24, creatinine 0.90. CRP 2.1. Sed rate 63. WBC 12.7. Hemoglobin 13.3.   Significant Imaging Studies: MRI left foot Soft tissue ulceration at the first amputation stump with acute osteomyelitis of the first proximal phalanx. No abscess. 2. Prior partial amputation of the second and third toes. Focal marrow edema in the residual distal third  middle phalanx with overlying soft tissue swelling, suspicious for early osteomyelitis. Correlate for ulcer on physical exam.  Antibiotic Therapy: Anti-infectives (From admission, onward)    Start     Dose/Rate Route Frequency Ordered Stop   07/21/23 1700  vancomycin (VANCOREADY) IVPB 750 mg/150 mL  Status:  Discontinued        750 mg 150 mL/hr over 60 Minutes Intravenous Every 24 hours 07/20/23 2058 07/20/23 2111   07/21/23 1700  metroNIDAZOLE (FLAGYL) IVPB 500 mg  Status:  Discontinued        500 mg 100 mL/hr over 60 Minutes Intravenous Every 12 hours 07/20/23 2117 07/21/23 1602   07/21/23 1700  ceFEPIme (MAXIPIME) 2 g in sodium chloride 0.9 % 100 mL IVPB  Status:  Discontinued        2 g 200 mL/hr over 30 Minutes Intravenous Every 8 hours 07/20/23 2117 07/21/23 1602   07/21/23 1700  amoxicillin-clavulanate (AUGMENTIN) 875-125 MG per tablet 1 tablet        1 tablet Oral Every 12 hours 07/21/23 1602 07/28/23 2159   07/21/23 0700  metroNIDAZOLE (FLAGYL) IVPB 500 mg  Status:  Discontinued        500 mg 100 mL/hr over 60 Minutes Intravenous Every 12 hours 07/20/23 2049 07/20/23 2116   07/21/23 0300  ceFEPIme (MAXIPIME) 2 g in sodium chloride 0.9 % 100 mL IVPB  Status:  Discontinued        2 g 200 mL/hr over 30 Minutes Intravenous Every 8 hours 07/20/23 2049 07/20/23 2116   07/20/23 2117  vancomycin variable dose per unstable renal function (pharmacist  dosing)  Status:  Discontinued         Does not apply See admin instructions 07/20/23 2117 07/21/23 1603   07/20/23 1800  ceFEPIme (MAXIPIME) 2 g in sodium chloride 0.9 % 100 mL IVPB        2 g 200 mL/hr over 30 Minutes Intravenous  Once 07/20/23 1749 07/20/23 2107   07/20/23 1800  metroNIDAZOLE (FLAGYL) IVPB 500 mg        500 mg 100 mL/hr over 60 Minutes Intravenous  Once 07/20/23 1749 07/20/23 1951   07/20/23 1800  vancomycin (VANCOREADY) IVPB 2000 mg/400 mL        2,000 mg 200 mL/hr over 120 Minutes Intravenous  Once 07/20/23 1753  07/20/23 2145       Procedures: 07/21/2023 amputation left 1st toe  Consultants: podiatry    Assessment and Plan: * Osteomyelitis of great toe of left foot (HCC) 07-22-2023 had amputation yesterday. Podiatry wants 7 more days of abx with augmentin. No acute medical reason for continued hospitalization. Awaiting discharge clearance by podiatry  07-23-2023 stable for DC today. Home with augmentin and doxycycline per podiatry recs. F/u with podiatry. Keep dressing intact until seen by podiatry. WBAT on left foot.  S/P amputation of lesser toe, left (HCC) - Great Toe on 07-21-2023 07-22-2023 f/u with podiatry. Keep dressing intact until seen by podiatry. No acute medical reason for continued hospitalization. Awaiting discharge clearance by podiatry  07-23-2023 stable for DC today. Home with augmentin and doxycycline per podiatry recs. F/u with podiatry. Keep dressing intact until seen by podiatry. WBAT on left foot.   HFmrEF (heart failure with mildly reduced ejection fraction) (HCC) 07-22-2023 remains on lopressor 25 mg bid.  Should be converted to Toprol-XL 50 mg qday if using GDMT as treatment plan. Continue with lasix 20 mg day, lisinopril 2.5 mg day.  F/u with cardiology to consider initiation of low dose Entresto.  07-23-2023 pt running low yesterday. Will hold lisinopril, lasix and imdur at discharge until seen by cardiology this week. Continue lopressor 25 mg bid with hold order(SBP <120 or HR <60)   Essential hypertension 07-22-2023 stable. On lopressor 25 mg bid, lisinopril 2.5 mg every day, imdur 15 mg qday, lasilx 20 mg day. F/u with PCP for HTN management.  07-23-2023 pt running low yesterday. Will hold lisinopril, lasix and imdur at discharge until seen by cardiology this week. Continue lopressor 25 mg bid with hold order(SBP <120 or HR <60)  Uncontrolled type 2 diabetes mellitus with hyperglycemia (HCC) 07-22-2023 A1c 10.2% earlier this month. Poor outpatient control. F/u  with PCP for glycemic control. On Jardiance and Tresiba. May need mealtime and SSI added as outpatient.  07-23-2023 pt needs f/u with PCP for improved CBG control as outpatient.  CAD (coronary artery disease) 07-22-2023 continue imdur and ranexa. On statin. She should also be on a ASA 81 mg daily. Don't see this on her meds list. Pt needs to f/u with cardiology.  07-23-2023 continue ranexa. Hold imdur due to low BP. Add asa 81 mg daily. Continue statin.  Obesity, Class II, BMI 35-39.9 07-22-2023 BMI 37.04  Hyperlipidemia 07-22-2023 continue with crestor 40 mg at bedtime.    DVT prophylaxis: heparin injection 5,000 Units Start: 07/20/23 2200    Code Status: Full Code Family Communication: no family at bedside. Pt is decisional Disposition Plan: return home Reason for continuing need for hospitalization: medically stable for DC today.  Objective: Vitals:   07/22/23 2210 07/22/23 2315 07/23/23 0547 07/23/23 0917  BP: Marland Kitchen)  92/52 (!) 151/71 122/62   Pulse:  94 90 93  Resp:  20 19 15   Temp:  98.1 F (36.7 C) 97.8 F (36.6 C) 98.3 F (36.8 C)  TempSrc:  Oral Oral Oral  SpO2:  100% 97% 98%  Weight:   106.7 kg   Height:       No intake or output data in the 24 hours ending 07/23/23 0935 Filed Weights   07/21/23 1047 07/22/23 0500 07/23/23 0547  Weight: 105.2 kg 104.1 kg 106.7 kg    Examination:  Physical Exam Vitals and nursing note reviewed.  Constitutional:      General: She is not in acute distress.    Appearance: She is obese. She is not toxic-appearing or diaphoretic.  HENT:     Head: Normocephalic and atraumatic.     Nose: Nose normal.  Eyes:     General: No scleral icterus. Cardiovascular:     Rate and Rhythm: Normal rate and regular rhythm.  Pulmonary:     Effort: Pulmonary effort is normal.     Breath sounds: Normal breath sounds.  Abdominal:     General: Bowel sounds are normal.     Palpations: Abdomen is soft.  Musculoskeletal:     Comments: Left  foot wrapped in bandage  Skin:    General: Skin is warm and dry.     Capillary Refill: Capillary refill takes less than 2 seconds.  Neurological:     Mental Status: She is alert and oriented to person, place, and time.     Data Reviewed: I have personally reviewed following labs and imaging studies  CBC: Recent Labs  Lab 07/20/23 1820 07/20/23 1956 07/21/23 0817 07/22/23 0748  WBC 12.7*  --  11.6* 10.9*  NEUTROABS 8.0*  --   --   --   HGB 12.2 13.3 11.4* 11.9*  HCT 39.9 39.0 35.8* 37.5  MCV 89.7  --  87.5 87.4  PLT 247  --  245 215   Basic Metabolic Panel: Recent Labs  Lab 07/20/23 1956 07/21/23 0717 07/22/23 0748  NA 138 136 138  K 4.3 4.0 3.5  CL 105 106 104  CO2  --  18* 22  GLUCOSE 158* 172* 171*  BUN 24* 17 15  CREATININE 0.90 1.17* 1.03*  CALCIUM  --  8.9 9.2  MG  --  2.0  --   PHOS  --  3.1  --    GFR: Estimated Creatinine Clearance: 68.2 mL/min (A) (by C-G formula based on SCr of 1.03 mg/dL (H)).  CBG: Recent Labs  Lab 07/21/23 2057 07/22/23 1216 07/22/23 1645 07/22/23 2054 07/23/23 0915  GLUCAP 235* 243* 225* 247* 249*   Lab Results  Component Value Date/Time   HGBA1C 10.2 (A) 07/12/2023 02:42 PM   HGBA1C 9.7 (H) 10/15/2015 08:41 AM     Recent Results (from the past 240 hours)  Blood culture (routine x 2)     Status: None (Preliminary result)   Collection Time: 07/20/23  6:08 PM   Specimen: BLOOD LEFT FOREARM  Result Value Ref Range Status   Specimen Description BLOOD LEFT FOREARM  Final   Special Requests   Final    BOTTLES DRAWN AEROBIC AND ANAEROBIC Blood Culture results may not be optimal due to an inadequate volume of blood received in culture bottles   Culture   Final    NO GROWTH 3 DAYS Performed at [facility], [address], [place], [place] [address]    Report Status  PENDING  Incomplete  Blood culture (routine x 2)     Status: None (Preliminary result)   Collection Time: 07/20/23  6:20 PM   Specimen: BLOOD   Result Value Ref Range Status   Specimen Description BLOOD LEFT ANTECUBITAL  Final   Special Requests   Final    BOTTLES DRAWN AEROBIC AND ANAEROBIC Blood Culture adequate volume   Culture   Final    NO GROWTH 3 DAYS Performed at [facility], [address], [place], [place] [address]    Report Status PENDING  Incomplete  Aerobic/Anaerobic Culture w Gram Stain (surgical/deep wound)     Status: None (Preliminary result)   Collection Time: 07/21/23 12:39 PM   Specimen: Toe, Left; Amputation  Result Value Ref Range Status   Specimen Description TISSUE  Final   Special Requests LEFT GREAT TOE  Final   Gram Stain NO WBC SEEN RARE GRAM POSITIVE COCCI IN PAIRS   Final   Culture   Final    NO GROWTH < 24 HOURS Performed at [facility], [address], [place], [place] [address]    Report Status PENDING  Incomplete     Radiology Studies: No results found.  Scheduled Meds:  amoxicillin-clavulanate  1 tablet Oral Q12H   dorzolamide  1 drop Both Eyes BID   doxycycline  100 mg Oral Q12H   empagliflozin  25 mg Oral q AM   heparin  5,000 Units Subcutaneous Q8H   insulin aspart  0-5 Units Subcutaneous QHS   insulin aspart  0-9 Units Subcutaneous TID WC   insulin glargine-yfgn  15 Units Subcutaneous QHS   isosorbide mononitrate  15 mg Oral Daily   metoprolol tartrate  25 mg Oral BID   pantoprazole  40 mg Oral Daily   pregabalin  75 mg Oral BID   ranolazine  500 mg Oral BID   rosuvastatin  40 mg Oral QPM   Continuous Infusions:   LOS: 3 days   Time spent: 45 minutes  Carollee Herter, DO  [facility]  07/23/2023, 9:35 AM

## 2023-07-23 NOTE — TOC Transition Note (Signed)
Transition of Care [facility]) - Discharge Note   Patient Details  Name: [name] MRN: [id number] Date of Birth: [birth year]  Transition of [facility]) CM/SW Contact:  Lawerance Sabal, RN Phone Number: 07/23/2023, 10:18 AM   Clinical Narrative:     Patient to transition home today. Spoke w patient and she would like RW and 3/1 for DC. This has been ordered to be delivered to her room through [facility] She would like referral placed to [facility] on [address] for PT, This referral has been placed through Epic, I intructed patient to call Monday to ensure they have referral, she is familiar with this office, it is her podiatrist and she states they have PT and she will follow up with him Monday by phone.  Patient states she has ride home, no further TOC needs.         Patient Goals and CMS Choice            Discharge Placement                       Discharge Plan and Services Additional resources added to the After Visit Summary for                                       Social Drivers of Health (SDOH) Interventions SDOH Screenings   Food Insecurity: No Food Insecurity (07/20/2023)  Housing: Low Risk  (07/20/2023)  Transportation Needs: No Transportation Needs (07/20/2023)  Utilities: Not At Risk (07/20/2023)  Alcohol Screen: Low Risk  (03/08/2018)  Tobacco Use: Medium Risk (07/21/2023)     Readmission Risk Interventions     No data to display

## 2023-07-24 ENCOUNTER — Other Ambulatory Visit: Payer: Self-pay | Admitting: Cardiovascular Disease

## 2023-07-24 ENCOUNTER — Other Ambulatory Visit: Payer: Self-pay | Admitting: Physician Assistant

## 2023-07-24 LAB — VAS US ABI WITH/WO TBI
Left ABI: 1.08
Right ABI: 1.18

## 2023-07-25 ENCOUNTER — Ambulatory Visit: Payer: 59 | Attending: Physician Assistant | Admitting: Physician Assistant

## 2023-07-25 LAB — CULTURE, BLOOD (ROUTINE X 2)
Culture: NO GROWTH
Culture: NO GROWTH
Special Requests: ADEQUATE

## 2023-07-25 LAB — SURGICAL PATHOLOGY

## 2023-07-25 NOTE — Telephone Encounter (Signed)
Pt's pharmacy is requesting a refill on lisinopril. This medication states that it is a pause on renewing this medication. Can pt's medication be refilled at this time? Please address

## 2023-07-26 LAB — AEROBIC/ANAEROBIC CULTURE W GRAM STAIN (SURGICAL/DEEP WOUND): Gram Stain: NONE SEEN

## 2023-07-28 ENCOUNTER — Ambulatory Visit (INDEPENDENT_AMBULATORY_CARE_PROVIDER_SITE_OTHER): Payer: 59 | Admitting: Podiatry

## 2023-07-28 ENCOUNTER — Ambulatory Visit (INDEPENDENT_AMBULATORY_CARE_PROVIDER_SITE_OTHER): Payer: 59

## 2023-07-28 DIAGNOSIS — M7752 Other enthesopathy of left foot: Secondary | ICD-10-CM

## 2023-07-28 DIAGNOSIS — M778 Other enthesopathies, not elsewhere classified: Secondary | ICD-10-CM

## 2023-07-28 DIAGNOSIS — S98132A Complete traumatic amputation of one left lesser toe, initial encounter: Secondary | ICD-10-CM

## 2023-07-28 DIAGNOSIS — Z9889 Other specified postprocedural states: Secondary | ICD-10-CM | POA: Diagnosis not present

## 2023-07-28 NOTE — Progress Notes (Unsigned)
Chief Complaint  Patient presents with   Post-op Follow-up    LT 1st amputation. Doing well, some pain. Not severe enough to take pain meds. She does have question surrounding shoes in the future. Surgical site is intact, minimal redness, no odor, no drainage.   Procedure: Left first toe amputation with Dr. Annamary Rummage Date of surgery: 07/21/2023  HPI: 63 y.o. female presents today as hospital follow-up status post left first toe amputation.  She is doing well.  Pain is well-controlled.  She has been ambulating in surgical shoe.  She has been taking oral antibiotics as directed.  Past Medical History:  Diagnosis Date   Adhesive capsulitis of shoulder    bilateral, Steroid injection Dr. Nedra Hai 1/12 bilaterally   CAD (coronary artery disease)    nonobstructive. Last cardiac cath (2008) showing left circumflex with mid 50% stenosis and distal luminal irregularities. Also with RCA with mid to distal 30-40% stenosis. // Previously evaluated by [facility], never followed up outpatient.   CAP (community acquired pneumonia) 06/15/2012   05/2012 CXR: Mild opacification of the posterior lung base on the lateral film  as cannot exclude infection/atelectasis.     CHF (congestive heart failure) (HCC)    Diabetes mellitus 2007   Type II, insulin dependent   Diabetic retinopathy    GERD (gastroesophageal reflux disease)    Glaucoma    Hyperlipidemia    Hypertension    Obesity    Peripheral neuropathy     Past Surgical History:  Procedure Laterality Date   AMPUTATION TOE Left 03/2023   3rd left toe   AMPUTATION TOE Left 07/21/2023   Procedure: AMPUTATION LEFT GREAT TOE;  Surgeon: Pilar Plate, DPM;  Location: [facility] OR;  Service: Orthopedics/Podiatry;  Laterality: Left;   ANTERIOR CERVICAL DECOMP/DISCECTOMY FUSION N/A 06/16/2016   Procedure: Anterior Cervical Decompression/discectomy Fusion - Cervical six - Cervical seven;  Surgeon: Tia Alert, MD;  Location: [facility] OR;   Service: Neurosurgery;  Laterality: N/A;  Anterior Cervical Decompression/discectomy Fusion - Cervical six - Cervical seven   APPENDECTOMY     CARDIAC CATHETERIZATION     CATARACT EXTRACTION     GLAUCOMA SURGERY     LEFT HEART CATH AND CORONARY ANGIOGRAPHY N/A 05/22/2018   Procedure: LEFT HEART CATH AND CORONARY ANGIOGRAPHY;  Surgeon: Lennette Bihari, MD;  Location: [facility] INVASIVE CV LAB;  Service: Cardiovascular;  Laterality: N/A;   LEFT HEART CATHETERIZATION WITH CORONARY ANGIOGRAM N/A 01/24/2012   Procedure: LEFT HEART CATHETERIZATION WITH CORONARY ANGIOGRAM;  Surgeon: Tonny Bollman, MD;  Location: [facility] CATH LAB;  Service: Cardiovascular;  Laterality: N/A;   POSTERIOR CERVICAL FUSION/FORAMINOTOMY N/A 10/22/2015   Procedure: Cervical three-Cervical Seven Posterior cervical fusion with lateral mass fixation,  Laminectomy Cervical Three-Cervical Seven;  Surgeon: Tia Alert, MD;  Location: [facility] NEURO ORS;  Service: Neurosurgery;  Laterality: N/A;  Posterior   TUBAL LIGATION      No Known Allergies  ROS denies any nausea, vomiting, fever, chills, chest pain, shortness of breath.   Physical Exam: There were no vitals filed for this visit.  General: The patient is alert and oriented x3 in no acute distress.  Dermatology: Skin is warm, dry and supple bilateral lower extremities. Interspaces are clear of maceration and debris.  Sutures are intact to left first toe amputation site without evidence of dehiscence or gapping.  Skin edges well-approximated and well coapted.  Vascular: Foot appears warm and well-perfused.  Palpable pedal pulses bilaterally. Capillary refill within normal  limits to remaining digits.   No erythema or calor.  Neurological: Light touch sensation grossly intact bilateral feet.   Musculoskeletal Exam: Status post left first toe amputation left foot  Radiographic Exam: 07/28/2023 left foot 3 views weightbearing Normal osseous mineralization. Joint spaces preserved.  Status  post left first toe amputation with disarticulation at first MPJ.  Assessment/Plan of Care: 1. Status post left foot surgery   2. Amputation of toe of left foot (HCC)      No orders of the defined types were placed in this encounter.  None  Discussed clinical findings with patient today.  # Status post left first toe amputation on 1/24 -Recently discharged from hospital for osteomyelitis of left first toe.  Status post amputation -Radiographs reviewed with patient today -Surgical site healing well. -Dressing applied consisting of Xeroform to incision site, 4 x 4 gauze, Kerlix, Ace wrap. -Continue weightbearing in surgical shoe favoring heel with walker assistance. -Pain well-controlled at this time -Patient to complete course of oral Augmentin 825-125 twice daily, doxycycline 10 mg twice daily -Keep dressing clean dry and intact. -Follow-up in approximately 2 weeks for possible suture removal. Patient states she followed Dr. Ardelle Anton prior to hospital admission, she would like to continue to see him going forward.  [name], AACFAS [facility]     [address] [place], [place] [address]                Office [phone]  Fax [phone]

## 2023-07-31 ENCOUNTER — Encounter: Payer: Self-pay | Admitting: Podiatry

## 2023-08-01 NOTE — Telephone Encounter (Signed)
 Called and spoke with patient who states that she is NOT taking Lisinopril  and that she didn't request the refill. Likely auto-refilled by pharmacy. Denial processed at this time. Pt previously scheduled to see EMERSON Pavy on 07/25/23, but cancelled stating she wanted to see Glendia Ferrier, PA instead. Appt was not scheduled, so scheduled for 08/16/23 now w/Scott.

## 2023-08-07 ENCOUNTER — Ambulatory Visit (INDEPENDENT_AMBULATORY_CARE_PROVIDER_SITE_OTHER): Payer: 59 | Admitting: Podiatry

## 2023-08-07 DIAGNOSIS — S98132A Complete traumatic amputation of one left lesser toe, initial encounter: Secondary | ICD-10-CM

## 2023-08-07 NOTE — Progress Notes (Signed)
         HPI:  Chief Complaint  Patient presents with   Routine Post Op    RM#13 hospital post op # 2/ left great toe amputation/dos 07/21/23. Patient states doing good no pain at all. No signs of infection no swelling.   Procedure: Left first toe amputation with Dr. Rosemarie Conquest Date of surgery: 07/21/2023  63 year old female presents with above concerns.  States her pain is controlled.  No fevers or chills that she reports.  Presents today for possible suture removal.    Physical Exam:  General: The patient is alert and oriented x3 in no acute distress.  Dermatology: Incision appears to be coapted with sutures intact.  There is minimal edema.  No erythema or warmth.  There is no obvious signs of infection.  No cellulitis present.  No fluctuation or crepitation.  Vascular: Foot appears warm and well-perfused.  Palpable pedal pulses bilaterally. Capillary refill within normal limits to remaining digits.   No erythema or calor.  Neurological: Decreased  Musculoskeletal Exam: Status post left first toe amputation left foot, no pain on exam  Assessment/Plan of Care: Status post left hallux imitation   -I removed a few of the sutures today the left remainder intact and plan on removing them next week.  Discussed that she can change the bandage at home.  She can wash with soap and water, dry thoroughly and apply a small amount of antibiotic ointment and dressing. -Remain in surgical shoe, limited weightbearing -Elevation -Monitor for any clinical signs or symptoms of infection and directed to call the office immediately should any occur or go to the ER.  Return in about 1 week (around 08/14/2023) for suture removal .  Charity Conch DPM

## 2023-08-14 ENCOUNTER — Ambulatory Visit: Payer: 59 | Admitting: Podiatry

## 2023-08-14 ENCOUNTER — Ambulatory Visit (INDEPENDENT_AMBULATORY_CARE_PROVIDER_SITE_OTHER): Payer: 59 | Admitting: Podiatry

## 2023-08-14 DIAGNOSIS — S98132A Complete traumatic amputation of one left lesser toe, initial encounter: Secondary | ICD-10-CM

## 2023-08-14 NOTE — Progress Notes (Signed)
         HPI:  Chief Complaint  Patient presents with   Routine Post Op    RM#13 right foot suture removal patient states is doing well no pain no signs of infection.     Procedure: Left first toe amputation with Dr. Annamary Rummage Date of surgery: 07/21/2023  63 year old female presents with above concerns.  She presents today for possible suture removal.  No pain although she does have neuropathy.  Does not report any fevers or chills.  She has no other concerns today.  She is the surgical shoe.    Physical Exam:  General: The patient is alert and oriented x3 in no acute distress.  Dermatology: Incision appears to be coapted with sutures intact.  There is minimal edema still remaining.  No erythema or warmth.  There is no obvious signs of infection.  No cellulitis present.  No fluctuation or crepitation.  Vascular: Foot appears warm and well-perfused.  Palpable pedal pulses bilaterally. Capillary refill within normal limits to remaining digits.   No erythema or calor.  Neurological: Decreased  Musculoskeletal Exam: Status post left first toe amputation left foot, no pain on exam  Assessment/Plan of Care: Status post left hallux imitation   -I remove the sutures today.  Steri-Strips applied for reinforcement.  Small amount of antibiotic was applied followed by dressing.  She can change the dressing daily with advice on the dressing.  Discussed wash with soap and water but do not soak the foot.  Manage surgical shoe.  Elevation. -Monitor for any clinical signs or symptoms of infection and directed to call the office immediately should any occur or go to the ER. -Glucose control   Return in about 2 weeks (around 08/28/2023) for post-op check.  Vivi Barrack DPM

## 2023-08-15 NOTE — Progress Notes (Deleted)
 {This patient may be at risk for Amyloid. She has one or more dx on the problem list or PMH from the following list - Abnormal EKG, CHF, Aortic Stenosis, Proteinuria, LVH, Carpal Tunnel Syndrome, Biceps Tendon Rupture, Syncope. See list below or review PMH.  Diagnoses From Problem List           Noted     HFmrEF (heart failure with mildly reduced ejection fraction) (HCC) 01/30/2012    Click HERE to open Cardiac Amyloid Screening SmartSet to order screening OR Click HERE to defer testing for 1 year or permanently :1}    Cardiology Office Note:    Date:  08/15/2023  ID:  [name], DOB 09/21/1960, MRN [id number] PCP: Raymon Mutton., FNP  [facility] Providers Cardiologist:  Tonny Bollman, MD Cardiology APP:  Beatrice Lecher, PA-C { Click to update primary MD,subspecialty MD or APP then REFRESH:1}    {Click to Open Review  :1}   Patient Profile:      Coronary artery disease  NSTEMI 04/2018 >> Cath w/ diffuse disease >> no targets for CABG >> Med Rx Cath: pLAD 50, mLAD 80, dLAD 70; oD1 95, mLCx 90, dLCx 80, OM2 95, mRCA 30, dRCA 30 PCI could be considered if medical Rx failed HFmrEF (heart failure with mildly reduced ejection fraction)  Non-ischemic cardiomyopathy (CM out of proportion to extensive CAD)  Echocardiogram 7/13: EF 35-40 Echocardiogram 11/16: EF 55-60 Echocardiogram 8/18: EF 50-55 Echocardiogram 11/19: Mild conc LVH, EF 45-50, diff HK, normal RVSF  Aortic atherosclerosis  ABIs 07/20/23: R 1.18, L 1.08 (normal) Morbid obesity  Diabetes mellitus  Hypertension  Hyperlipidemia  Chronic kidney disease Lung nodule (followed by primary care)      {      :1}    [name] is a 63 y.o. female who returns for *** *** ROS-See HPI***    Studies Reviewed:       *** Results    Risk Assessment/Calculations:   {Does this patient have ATRIAL FIBRILLATION?:[phone]} No BP recorded.  {Refresh Note OR Click here to enter BP  :1}***       Physical  Exam:   VS:  LMP 02/13/2011    Wt Readings from Last 3 Encounters:  07/23/23 235 lb 3.7 oz (106.7 kg)  07/12/23 235 lb (106.6 kg)  04/03/23 239 lb (108.4 kg)    Physical Exam***     Assessment and Plan:   Assessment & Plan Coronary artery disease involving native coronary artery of native heart with angina pectoris (HCC)  HFmrEF (heart failure with mildly reduced ejection fraction) (HCC)  Essential hypertension  Pure hypercholesterolemia  Assessment and Plan Assessment & Plan    {  CAD (coronary artery disease) Status post non-STEMI in November 2019.  She has diffuse small vessel and distal vessel disease which has been managed medically. She is doing well w/o angina on her current regimen. Continue Plavix 75 mg once daily, Imdur 60 mg once daily, Ranexa 500 mg twice daily, Crestor 40 mg once daily. Her HR tends to run high. I will increase Metoprolol Tartrate to 100 mg twice daily. F/u in 6 mos.   HFmrEF (heart failure with mildly reduced ejection fraction) (HCC) Non-ischemic cardiomyopathy. EF 45-50. Volume status stable. NYHA II-IIb. Continue Jardiance 25 mg once daily (higher dose for diabetes), Imdur 60 mg once daily, Lisinopril 2.5 mg once daily. Increase Metoprolol Tartrate to 100 mg twice daily as noted. F/u in 6 mos.  Hyperlipidemia LDL optimal on most recent lab work.  Continue current Rx.     Essential hypertension BP controlled. Adjust Metoprolol dose for coronary artery disease, congestive heart failure. Continue Imdur 60 mg once daily, Lisinopril 2.5 mg once daily.    Type 2 diabetes mellitus with retinopathy, with long-term current use of insulin (HCC) Managed by endocrinology.     :1}    {Are you ordering a CV Procedure (e.g. stress test, cath, DCCV, TEE, etc)?   Press F2        :[id number]}  Dispo:  No follow-ups on file.  Signed, Tereso Newcomer, PA-C

## 2023-08-16 ENCOUNTER — Ambulatory Visit: Payer: 59 | Admitting: Physician Assistant

## 2023-08-18 ENCOUNTER — Telehealth: Payer: Self-pay | Admitting: Cardiovascular Disease

## 2023-08-18 MED ORDER — RANOLAZINE ER 500 MG PO TB12: 500.0000 mg | ORAL_TABLET | Freq: Two times a day (BID) | ORAL | 1 refills | Status: DC

## 2023-08-18 NOTE — Telephone Encounter (Signed)
*  STAT* If patient is at the pharmacy, call can be transferred to refill team.   1. Which medications need to be refilled? (please list name of each medication and dose if known) ranolazine (RANEXA) 500 MG 12 hr tablet  2. Which pharmacy/location (including street and city if local pharmacy) is medication to be sent to? [facility] [phone] - [place], [place] - [address]  3. Do they need a 30 day or 90 day supply?   90 day supply

## 2023-08-18 NOTE — Telephone Encounter (Signed)
 Pt's medication was sent to pt's pharmacy as requested. Confirmation received.

## 2023-08-28 ENCOUNTER — Other Ambulatory Visit: Payer: Self-pay | Admitting: Cardiovascular Disease

## 2023-08-28 ENCOUNTER — Ambulatory Visit (INDEPENDENT_AMBULATORY_CARE_PROVIDER_SITE_OTHER): Payer: 59 | Admitting: Podiatry

## 2023-08-28 DIAGNOSIS — S98132A Complete traumatic amputation of one left lesser toe, initial encounter: Secondary | ICD-10-CM | POA: Diagnosis not present

## 2023-08-29 NOTE — Progress Notes (Signed)
         HPI:  Chief Complaint  Patient presents with   Routine Post Op    RM#13 post op check left foot patient states doing well no signs of infection today that I can see some dryness.     Procedure: Left first toe amputation with Dr. Annamary Rummage Date of surgery: 07/21/2023  63 year old female presents with above concerns.  States that she has been doing well.  She is to remain to the surgical shoe.  She has not seen any increase in swelling or redness.  No fevers or chills.  No new ulcerations are identified.  She states that she is been working on her blood sugar although still high it is coming down.    Physical Exam:  General: The patient is alert and oriented x3 in no acute distress.  Dermatology: Incision appears to be coapted and no evidence of dehiscence.  There is small with a scab present over the proximal incision.  Persistent erythema, ascending cellulitis.  No drainage or pus.  No fluctuation or crepitation.  There is no odor.   Vascular: Foot appears warm and well-perfused.  Palpable pedal pulses bilaterally. Capillary refill within normal limits to remaining digits.   No erythema or calor.  Neurological: Decreased  Musculoskeletal Exam: Status post left first toe amputation left foot, no pain on exam.  Digital contractures noted to the fourth and fifth toes.  Assessment/Plan of Care: Status post left hallux amputation   -Incision appears to be healing well.  Small scab present on the central aspect but no evidence of dehiscence.  Recommend wash with soap and water, dry thoroughly apply bandage daily until the scab comes off.  Remain in surgical shoe for now as well until scab comes off.  Monitor for any signs or symptoms of infection or any further skin breakdown or irritation.  Return in about 2 weeks (around 09/11/2023) for follow up for toe amputation; [facility] [place] for diabetic shoes/inserts.  Vivi Barrack DPM

## 2023-08-31 ENCOUNTER — Telehealth: Payer: Self-pay | Admitting: Podiatry

## 2023-08-31 ENCOUNTER — Other Ambulatory Visit: Payer: Self-pay | Admitting: Podiatry

## 2023-08-31 MED ORDER — PREGABALIN 75 MG PO CAPS: 75.0000 mg | ORAL_CAPSULE | Freq: Two times a day (BID) | ORAL | 2 refills | Status: DC

## 2023-08-31 NOTE — Telephone Encounter (Signed)
 Medication refill on pregabalin (LYRICA) 75 MG capsule to pharmacy: [facility] [phone] - [place], [place] - [address] AT [name] [address] Phone: [phone]  Fax: [phone]

## 2023-08-31 NOTE — Telephone Encounter (Signed)
 Called and notified prescription was called in

## 2023-09-05 LAB — HM DIABETES EYE EXAM

## 2023-09-18 ENCOUNTER — Ambulatory Visit (INDEPENDENT_AMBULATORY_CARE_PROVIDER_SITE_OTHER): Admitting: Podiatry

## 2023-09-18 DIAGNOSIS — S98132A Complete traumatic amputation of one left lesser toe, initial encounter: Secondary | ICD-10-CM | POA: Diagnosis not present

## 2023-09-18 NOTE — Progress Notes (Signed)
         HPI:  Chief Complaint  Patient presents with   Routine Post Op    RM#11 Checkup/post op / left great toe amputation/dos 07/21/23 patient states doing well has no concerns.No signs of infection at this time no pain or swelling.      Procedure: Left first toe amputation with Dr. Annamary Rummage Date of surgery: 07/21/2023  63 year old female presents with above concerns.  States that she has been doing well.  She denies any drainage approximately some redness.  She does not report any fevers or chills.  She still in surgical shoe.  No other concerns.     Physical Exam:  General: The patient is alert and oriented x3 in no acute distress.  Dermatology: Incision appears to be coapted and no evidence of dehiscence.  There is thick hyperkeratotic tissue, scabbing present on the incision upon debridement incision appears to be coapted.  There is no surrounding erythema, ascending cellulitis.  No drainage or pus.  No fluctuation or crepitation.  Is noted.  Vascular: Foot appears warm and well-perfused.  Palpable pedal pulses bilaterally. Capillary refill within normal limits to remaining digits.   No erythema or calor.  Neurological: Decreased  Musculoskeletal Exam: Status post left first toe amputation left foot, no pain on exam.  Digital contractures noted to the fourth and fifth toes.  Assessment/Plan of Care: Status post left hallux amputation   -Incision appears to be healing well.  There was thick callus, scabbing tissue noted along the incision which I debrided today.  Patient appears to be healing well without any signs of infection.  I still keep a bandage on the area daily he is small antibiotic ointment.  Remain in surgical shoe for now to make sure that the skin continues to heal.  Currently no signs of infection continue to monitor.  -Monitor for any clinical signs or symptoms of infection and directed to call the office immediately should any occur or go to the  ER.  Return in about 2 weeks (around 10/02/2023) for follow up from toe amputaiton .  Vivi Barrack DPM

## 2023-10-02 ENCOUNTER — Ambulatory Visit (INDEPENDENT_AMBULATORY_CARE_PROVIDER_SITE_OTHER): Admitting: Podiatry

## 2023-10-02 ENCOUNTER — Encounter: Payer: Self-pay | Admitting: Podiatry

## 2023-10-02 DIAGNOSIS — S98132A Complete traumatic amputation of one left lesser toe, initial encounter: Secondary | ICD-10-CM

## 2023-10-02 DIAGNOSIS — Z794 Long term (current) use of insulin: Secondary | ICD-10-CM

## 2023-10-02 DIAGNOSIS — E1142 Type 2 diabetes mellitus with diabetic polyneuropathy: Secondary | ICD-10-CM

## 2023-10-02 DIAGNOSIS — L84 Corns and callosities: Secondary | ICD-10-CM | POA: Diagnosis not present

## 2023-10-04 NOTE — Progress Notes (Signed)
         HPI:   Procedure: Left first toe amputation with Dr. Annamary Rummage Date of surgery: 07/21/2023  64 year old female presents with above concerns.  States that she is doing well.  She still in the surgical shoe.  She does not report any open lesions or other concerns at the surgical site.   She is also concerned calluses to the right first and second toe but denies any skin breakdown.  No open lesions.     Physical Exam:  General: The patient is alert and oriented x3 in no acute distress.  Dermatology: Incision on the hallux is well coapted.  There is callus commission on the incision which I debrided so any complications or bleeding.  It appears that the incision is healed.  There is thick hyperkeratotic tissue at the distal aspect of right hallux, second toe.  There is no underlying ulceration, drainage or signs of infection.  Vascular: Foot appears warm and well-perfused.  Palpable pedal pulses bilaterally. Capillary refill within normal limits to remaining digits.   No erythema or calor.  Neurological: Decreased  Musculoskeletal Exam: Status post left first toe amputation left foot, no pain on exam.  Digital contractures noted to the fourth and fifth toes.  Assessment/Plan of Care: Status post left hallux amputation; we also callus right foot  Status post left hallux amputation -Incision appears to be healing well.  Hyperkeratotic issue in complications or bleeding.  Discussed moisturizer or small Vaseline daily.  She can gradually start to transition to regular shoe as tolerated.  Continue elevation.  -Monitor for any clinical signs or symptoms of infection and directed to call the office immediately should any occur or go to the ER.  Preulcerative calluses right foot -Sharply debrided X 2 without any complications or bleeding.   Vivi Barrack DPM

## 2023-10-10 ENCOUNTER — Encounter: Payer: Self-pay | Admitting: Internal Medicine

## 2023-10-10 ENCOUNTER — Ambulatory Visit (INDEPENDENT_AMBULATORY_CARE_PROVIDER_SITE_OTHER): Payer: 59 | Admitting: Internal Medicine

## 2023-10-10 VITALS — BP 116/60 | HR 114 | Resp 20 | Ht 66.0 in | Wt 240.2 lb

## 2023-10-10 DIAGNOSIS — E11319 Type 2 diabetes mellitus with unspecified diabetic retinopathy without macular edema: Secondary | ICD-10-CM

## 2023-10-10 DIAGNOSIS — E1122 Type 2 diabetes mellitus with diabetic chronic kidney disease: Secondary | ICD-10-CM | POA: Diagnosis not present

## 2023-10-10 DIAGNOSIS — N1831 Chronic kidney disease, stage 3a: Secondary | ICD-10-CM

## 2023-10-10 DIAGNOSIS — E1159 Type 2 diabetes mellitus with other circulatory complications: Secondary | ICD-10-CM

## 2023-10-10 DIAGNOSIS — E1142 Type 2 diabetes mellitus with diabetic polyneuropathy: Secondary | ICD-10-CM

## 2023-10-10 DIAGNOSIS — Z794 Long term (current) use of insulin: Secondary | ICD-10-CM

## 2023-10-10 LAB — POCT GLYCOSYLATED HEMOGLOBIN (HGB A1C): Hemoglobin A1C: 8.7 % — AB (ref 4.0–5.6)

## 2023-10-10 MED ORDER — OMNIPOD 5 G7 INTRO (GEN 5) KIT: 1.0000 | PACK | 0 refills | Status: DC

## 2023-10-10 MED ORDER — OMNIPOD 5 G7 PODS (GEN 5) MISC: 1.0000 | 3 refills | Status: DC

## 2023-10-10 NOTE — Patient Instructions (Signed)
-   Continue  Tresiba  44 units daily  - Continue  Jardiance  25 mg daily - Continues Ozempic 2 mg ONCE WEEKLY  -Novolog correctional insulin:  Use the scale below to help guide you BEFORE each meal   Blood sugar before meal Number of units to inject  Less than 160 12 unit  161 -  190 13 units  191 -  220 14 units  241 -  250 15 units  251 -  280 16 units  281 -  310 17 units  311 -  340 18 units  341 -  370 19 units  371 -  400 20 units     HOW TO TREAT LOW BLOOD SUGARS (Blood sugar LESS THAN 70 MG/DL) Please follow the RULE OF 15 for the treatment of hypoglycemia treatment (when your (blood sugars are less than 70 mg/dL)   STEP 1: Take 15 grams of carbohydrates when your blood sugar is low, which includes:  3-4 GLUCOSE TABS  OR 3-4 OZ OF JUICE OR REGULAR SODA OR ONE TUBE OF GLUCOSE GEL    STEP 2: RECHECK blood sugar in 15 MINUTES STEP 3: If your blood sugar is still low at the 15 minute recheck --> then, go back to STEP 1 and treat AGAIN with another 15 grams of carbohydrates

## 2023-10-10 NOTE — Progress Notes (Signed)
 Name: KATLYNN NASER  Age/ Sex: 63 y.o., female   MRN/ DOB: [id number], 01/19/1961     PCP: Shannan Dart., FNP   Reason for Endocrinology Evaluation: Type 2 Diabetes Mellitus  Initial Endocrine Consultative Visit: 10/07/2019    PATIENT IDENTIFIER: Ms. FAITHE ARIOLA is a 63 y.o. female with a past medical history of HTn and T2DM. The patient has followed with Endocrinology clinic since 10/07/2019 for consultative assistance with management of her diabetes.      DIABETIC HISTORY:  Ms. Schellhorn was diagnosed with gestational diabetes in 1984, requiring insulin , she was off insulin until her next pregnancy in 1985 and has been on insulin ever since. Her hemoglobin A1c has ranged from 8.1% in 2016, peaking at 12.5% in 2020.   On her initial visit to our clinic, she had an A1c of 9.6%. She was on MDI regimen as well as victoza and metformin, which were continued and added jardiance.   Developed yeast infections to Jardiance 12/2019  Switched victoza to trulicity 12/2019  Switched to ozempic 2024  Metformin discontinued 2024 due to low GFR   SUBJECTIVE:   During the last visit (07/12/2023): A1c 10.2%    Today (10/10/2023): Ms. Clingan is here for a follow up on diabetes management.  She checks her blood sugars multiple times daily through CGM. She has  hypoglycemic episodes since her last visit. She is symptomatic with these episodes.    The patient continues to follow-up with podiatry , since her last visit here she had a left first great toe amputation 06/2023 , she already has a history of  left 3rd toe amputation 03/2023  Denies nausea, vomiting  Denies diarrhea but has occasional constipation , MOM works  Has occasional genital irritation     HOME DIABETES REGIMEN:  Tresiba  44 units daily Humalog 14 units with each meal Jardiance 25 mg daily  Ozempic 2 mg weekly  CF: Humalog ((BG-130/30) TIDQAC    Statin: yes ACE-I/ARB: yes     CONTINUOUS GLUCOSE  MONITORING RECORD INTERPRETATION    Dates of Recording:  4/2-4/15/2025  Sensor description:dexcom  Results statistics:   CGM use % of time 94  Average and SD 202/33.3  Time in range 32%  % Time Above 180 42  % Time above 250 24  % Time Below target 2     Glycemic patterns summary: BGs are variable overnight and fluctuate during the day  Hyperglycemic episodes  postprandial   Hypoglycemic episodes occurred after a bolus  Overnight periods: Variable     Microvascular complications:  Neuropathy, hx left 3rd toe and left first toe amputations, retinopathy  Denies: CKD  Last eye exam: Completed 06/2019   Macrovascular complications:  CAD, CHF Denies:  PVD, CVA       HISTORY:  Past Medical History:  Past Medical History:  Diagnosis Date   Adhesive capsulitis of shoulder    bilateral, Steroid injection Dr. Merlyn Starring 1/12 bilaterally   CAD (coronary artery disease)    nonobstructive. Last cardiac cath (2008) showing left circumflex with mid 50% stenosis and distal luminal irregularities. Also with RCA with mid to distal 30-40% stenosis. // Previously evaluated by [facility], never followed up outpatient.   CAP (community acquired pneumonia) 06/15/2012   05/2012 CXR: Mild opacification of the posterior lung base on the lateral film  as cannot exclude infection/atelectasis.     CHF (congestive heart failure) (HCC)    Diabetes mellitus 2007   Type II, insulin dependent  Diabetic retinopathy    GERD (gastroesophageal reflux disease)    Glaucoma    Hyperlipidemia    Hypertension    Obesity    Peripheral neuropathy    Past Surgical History:  Past Surgical History:  Procedure Laterality Date   AMPUTATION TOE Left 03/2023   3rd left toe   AMPUTATION TOE Left 07/21/2023   Procedure: AMPUTATION LEFT GREAT TOE;  Surgeon: Evertt Hoe, DPM;  Location: [facility] OR;  Service: Orthopedics/Podiatry;  Laterality: Left;   ANTERIOR CERVICAL DECOMP/DISCECTOMY FUSION N/A  06/16/2016   Procedure: Anterior Cervical Decompression/discectomy Fusion - Cervical six - Cervical seven;  Surgeon: Isadora Mar, MD;  Location: [facility] OR;  Service: Neurosurgery;  Laterality: N/A;  Anterior Cervical Decompression/discectomy Fusion - Cervical six - Cervical seven   APPENDECTOMY     CARDIAC CATHETERIZATION     CATARACT EXTRACTION     GLAUCOMA SURGERY     LEFT HEART CATH AND CORONARY ANGIOGRAPHY N/A 05/22/2018   Procedure: LEFT HEART CATH AND CORONARY ANGIOGRAPHY;  Surgeon: Millicent Ally, MD;  Location: [facility] INVASIVE CV LAB;  Service: Cardiovascular;  Laterality: N/A;   LEFT HEART CATHETERIZATION WITH CORONARY ANGIOGRAM N/A 01/24/2012   Procedure: LEFT HEART CATHETERIZATION WITH CORONARY ANGIOGRAM;  Surgeon: Arnoldo Lapping, MD;  Location[facility] CATH LAB;  Service: Cardiovascular;  Laterality: N/A;   POSTERIOR CERVICAL FUSION/FORAMINOTOMY N/A 10/22/2015   Procedure: Cervical three-Cervical Seven Posterior cervical fusion with lateral mass fixation,  Laminectomy Cervical Three-Cervical Seven;  Surgeon: Isadora Mar, MD;  Location: [facility] NEURO ORS;  Service: Neurosurgery;  Laterality: N/A;  Posterior   TUBAL LIGATION     Social History:  reports that she quit smoking about 17 years ago. Her smoking use included cigarettes. She started smoking about 24 years ago. She has a 2.1 pack-year smoking history. She has never used smokeless tobacco. She reports that she does not drink alcohol and does not use drugs. Family History:  Family History  Problem Relation Age of Onset   Heart disease Mother    Hypertension Sister    Hypertension Sister    Hypertension Sister    Heart attack Neg Hx    Stroke Neg Hx      HOME MEDICATIONS: Allergies as of 10/10/2023   No Known Allergies      Medication List        Accurate as of October 10, 2023 10:07 AM. If you have any questions, ask your nurse or doctor.          PAUSE taking these medications    furosemide 20 MG tablet Wait to take  this until your doctor or other care provider tells you to start again. Commonly known as: LASIX Take 20 mg by mouth 2 (two) times daily.   isosorbide mononitrate 60 MG 24 hr tablet Wait to take this until your doctor or other care provider tells you to start again. Commonly known as: IMDUR TAKE 1 TABLET EVERY DAY   lisinopril 2.5 MG tablet Wait to take this until your doctor or other care provider tells you to start again. Commonly known as: ZESTRIL TAKE 1 TABLET(2.5 MG) BY MOUTH DAILY       TAKE these medications    amitriptyline 25 MG tablet Commonly known as: ELAVIL Take 1 tablet (25 mg total) by mouth at bedtime.   ammonium lactate 12 % cream Commonly known as: AMLACTIN Apply 1 Application topically as needed for dry skin.   aspirin EC 81 MG tablet Take 1 tablet (81  mg total) by mouth daily. Swallow whole.   B-D SINGLE USE SWABS REGULAR Pads Use as directed   clopidogrel 75 MG tablet Commonly known as: PLAVIX Take 1 tablet (75 mg total) by mouth daily. What changed: when to take this   Combigan 0.2-0.5 % ophthalmic solution Generic drug: brimonidine-timolol Place 1 drop into both eyes 2 (two) times daily.   Dexcom G6 Transmitter Misc 1 Device by Does not apply route as directed.   Dexcom G7 Receiver Devi Use to check blood sugars   Dexcom G7 Sensor Misc Change sensor every 10 days   diphenhydrAMINE 25 MG tablet Commonly known as: BENADRYL Take 1 tablet (25 mg total) by mouth every 6 (six) hours as needed for itching or allergies.   dorzolamide 2 % ophthalmic solution Commonly known as: TRUSOPT Place 1 drop into both eyes 2 (two) times daily.   empagliflozin 25 MG Tabs tablet Commonly known as: Jardiance Take 1 tablet (25 mg total) by mouth daily. What changed: when to take this   glucose blood test strip Commonly known as: OneTouch Verio USE TO CHECK BLOOD SUGAR 4 TIMES DAILY AS DIRECTED   GNP Truetrack Test Strips test strip Generic drug:  glucose blood Use as instructed   True Metrix Blood Glucose Test test strip Generic drug: glucose blood Use as instructed   GNP True Metrix Air Meter w/Device Kit 1 kit by Does not apply route 4 (four) times daily. E11.42   Guaifenesin 1200 MG Tb12 Commonly known as: Mucinex Maximum Strength Take 1 tablet (1,200 mg total) by mouth every 12 (twelve) hours as needed.   HYDROcodone-acetaminophen 5-325 MG tablet Commonly known as: NORCO/VICODIN Take 1-2 tablets by mouth every 6 (six) hours as needed.   insulin lispro 100 UNIT/ML KwikPen Commonly known as: HumaLOG KwikPen Max daily 60 units What changed:  how much to take how to take this additional instructions   Insulin Pen Needle 31G X 5 MM Misc 1 Device by Does not apply route in the morning, at noon, in the evening, and at bedtime.   metoprolol tartrate 25 MG tablet Commonly known as: LOPRESSOR Take 1 tablet (25 mg total) by mouth 2 (two) times daily. Hold for SBP <120 or HR <60   montelukast 10 MG tablet Commonly known as: SINGULAIR Take 10 mg by mouth at bedtime as needed.   nitroGLYCERIN 0.4 MG SL tablet Commonly known as: NITROSTAT DISSOLVE 1 TABLET (0.4 MG TOTAL) UNDER THE TONGUE EVERY 5 MINUTES AS NEEDED FOR CHEST PAIN. What changed: See the new instructions.   OneTouch Delica Lancets Fine Misc Check blood sugar as instructed up to 3 times a day   TRUEplus Lancets 33G Misc Check 4 times a daily   oxyCODONE 5 MG immediate release tablet Commonly known as: Oxy IR/ROXICODONE Take 5 mg by mouth 4 (four) times daily as needed.   pantoprazole 40 MG tablet Commonly known as: PROTONIX Take 1 tablet (40 mg total) by mouth daily. What changed: when to take this   pregabalin 75 MG capsule Commonly known as: Lyrica Take 1 capsule (75 mg total) by mouth 2 (two) times daily.   ranolazine 500 MG 12 hr tablet Commonly known as: Ranexa Take 1 tablet (500 mg total) by mouth 2 (two) times daily.   Rocklatan  0.02-0.005 % Soln Generic drug: Netarsudil-Latanoprost Place 1 drop into both eyes at bedtime.   rosuvastatin 40 MG tablet Commonly known as: CRESTOR TAKE 1 TABLET(40 MG) BY MOUTH DAILY What changed: See the new  instructions.   Semaglutide (2 MG/DOSE) 8 MG/3ML Sopn Inject 2 mg as directed once a week. What changed: additional instructions   Tresiba FlexTouch 100 UNIT/ML FlexTouch Pen Generic drug: insulin degludec Inject 44 Units into the skin daily. What changed: when to take this   True Metrix Level 1 Low Soln Use as directed         OBJECTIVE:   Vital Signs: BP 116/60 (BP Location: Right Arm, Patient Position: Sitting, Cuff Size: Large)   Pulse (!) 114   Resp 20   Ht 5\' 6"  (1.676 m)   Wt 240 lb 3.2 oz (109 kg)   LMP 02/13/2011   SpO2 96%   BMI 38.77 kg/m   Wt Readings from Last 3 Encounters:  10/10/23 240 lb 3.2 oz (109 kg)  07/23/23 235 lb 3.7 oz (106.7 kg)  07/12/23 235 lb (106.6 kg)     Exam: General: Pt appears well and is in NAD  Lungs: CTA  Heart: RRR  Extremities: No edema  Neuro: MS is good with appropriate affect, pt is alert and Ox3     DM foot exam: per podiatry 10/02/2023    DATA REVIEWED:  Lab Results  Component Value Date   HGBA1C 10.2 (A) 07/12/2023   HGBA1C 9.6 (A) 08/19/2022   HGBA1C 10.0 (A) 02/15/2022    Latest Reference Range & Units 07/22/23 07:48  Sodium 135 - 145 mmol/L 138  Potassium 3.5 - 5.1 mmol/L 3.5  Chloride 98 - 111 mmol/L 104  CO2 22 - 32 mmol/L 22  Glucose 70 - 99 mg/dL 191 (H)  BUN 8 - 23 mg/dL 15  Creatinine 4.78 - 2.95 mg/dL 6.21 (H)  Calcium 8.9 - 10.3 mg/dL 9.2  Anion gap 5 - 15  12  GFR, Estimated >60 mL/min >60    ASSESSMENT / PLAN / RECOMMENDATIONS:   1) Type 2 Diabetes Mellitus, Poorly controlled, With retinopathic,  neuropathic, and macrovascular  complications complications - Most recent A1c of 8.7%. Goal A1c < 7.0 %.     -Her A1c has trended down from 10.9% to 8.7% -In reviewing CGM,  patient has been noted postprandial hypoglycemia at times, I will decrease her prandial insulin -I did again recommend insulin pump technology, will prescribe the OmniPod, I have tried the tandem before but she was unable to get it -A referral to our CDE for training has been initiated -Metformin discontinued due to low GFR  MEDICATIONS: - Continue Tresiba  44 units daily  - Decrease Humalog 12 units with each meal  - Continue  Jardiance  25 mg daily - Continue Ozempic 2 mg weekly - CF : Humalog  (BG -130/30) TIDQAC   EDUCATION / INSTRUCTIONS: BG monitoring instructions: Patient is instructed to check her blood sugars 4 times a day, before meals and bedtime  Call [facility] if: BG persistently < 70  I reviewed the Rule of 15 for the treatment of hypoglycemia in detail with the patient. Literature supplied.    2) Diabetic complications:  Eye: Does have known diabetic retinopathy.  Neuro/ Feet: Does have known diabetic peripheral neuropathy .  Renal: Patient does not have known baseline CKD. She   is on an ACEI/ARB at present.      F/U in 4 months    Signed electronically by: Natale Bail, MD  [facility]  [facility] [address] [place], [place] [address] Phone: [phone] FAX: [phone]   CC: Shannan Dart., FNP [phone]  [place] [address] Phone: [phone]  Fax: [phone]  Return to Endocrinology clinic as below: Future Appointments  Date Time Provider Department Center  10/18/2023  3:10 PM Gabino Joe, [place] [facility]  10/31/2023  9:15 AM Clydia Dart Archer Kobs, DPM [facility]

## 2023-10-12 ENCOUNTER — Encounter: Payer: Self-pay | Admitting: Internal Medicine

## 2023-10-18 ENCOUNTER — Encounter: Payer: Self-pay | Admitting: Physician Assistant

## 2023-10-18 ENCOUNTER — Ambulatory Visit: Payer: 59 | Attending: Physician Assistant | Admitting: Physician Assistant

## 2023-10-18 ENCOUNTER — Other Ambulatory Visit: Payer: Self-pay | Admitting: Physician Assistant

## 2023-10-18 ENCOUNTER — Encounter: Payer: Self-pay | Admitting: Nutrition

## 2023-10-18 ENCOUNTER — Ambulatory Visit

## 2023-10-18 ENCOUNTER — Telehealth: Payer: Self-pay | Admitting: Nutrition

## 2023-10-18 VITALS — BP 130/70 | HR 93 | Ht 66.0 in | Wt 243.4 lb

## 2023-10-18 DIAGNOSIS — I5020 Unspecified systolic (congestive) heart failure: Secondary | ICD-10-CM

## 2023-10-18 DIAGNOSIS — I10 Essential (primary) hypertension: Secondary | ICD-10-CM

## 2023-10-18 DIAGNOSIS — R002 Palpitations: Secondary | ICD-10-CM

## 2023-10-18 DIAGNOSIS — I25119 Atherosclerotic heart disease of native coronary artery with unspecified angina pectoris: Secondary | ICD-10-CM | POA: Diagnosis not present

## 2023-10-18 DIAGNOSIS — E782 Mixed hyperlipidemia: Secondary | ICD-10-CM

## 2023-10-18 MED ORDER — METOPROLOL TARTRATE 25 MG PO TABS
ORAL_TABLET | ORAL | 3 refills | Status: DC
Start: 2023-10-18 — End: 2024-03-26

## 2023-10-18 NOTE — Assessment & Plan Note (Signed)
 Blood pressure seems to be well-controlled.  She notes episodes of dizziness with associated palpitations. - Continue Imdur  60 mg daily, lisinopril  2.5 mg daily, metoprolol  tartrate 50 mg in the morning and 25 mg in the evening - Monitor blood pressure at home for one week and report readings via MyChart

## 2023-10-18 NOTE — Progress Notes (Signed)
 Cardiology Office Note:    Date:  10/18/2023  ID:  ILYSSA [name], DOB March 06, 1961, MRN [id number] PCP: Shannan Dart., FNP  [facility] Providers Cardiologist:  Arnoldo Lapping, MD Cardiology APP:  Gabino Joe, PA-C       Patient Profile:      Coronary artery disease  NSTEMI 04/2018 >> Cath w/ diffuse disease >> no targets for CABG >> Med Rx Cath: pLAD 50, mLAD 80, dLAD 70; oD1 95, mLCx 90, dLCx 80, OM2 95, mRCA 30, dRCA 30 PCI could be considered if medical Rx failed HFmrEF (heart failure with mildly reduced ejection fraction)  Non-ischemic cardiomyopathy (CM out of proportion to extensive CAD)  Echocardiogram 7/13: EF 35-40 Echocardiogram 11/16: EF 55-60 Echocardiogram 8/18: EF 50-55 Echocardiogram 11/19: Mild conc LVH, EF 45-50, diff HK, normal RVSF  Aortic atherosclerosis  ABIs 07/20/2023: Normal (L 1.08, R 1.18) Morbid obesity  Diabetes mellitus  Osteomyelitis 06/2023 s/p left great toe amputation Hypertension  Hyperlipidemia  Chronic kidney disease Lung nodule (followed by primary care)       Discussed the use of AI scribe software for clinical note transcription with the patient, who gave verbal consent to proceed.  History of Present Illness [name] is a 63 y.o. female returns for follow-up of CAD, CHF.  She was last seen in November 2023.  She was admitted in January 2025 with left great toe osteomyelitis.  She ultimately underwent amputation of her toe.  Her blood pressure was running low and several of her medications were held (lisinopril , furosemide , isosorbide ).  She is here with her daughter.  She has no current chest symptoms suggestive of angina. She experiences shortness of breath with exertion, such as reaching her arms up or walking up stairs, but there is no worsening of these symptoms. She experienced dizziness and palpitations, particularly when taking higher doses of metoprolol . Dizziness improves when she takes a lower dose of  metoprolol . She has been monitoring her blood pressure more regularly and it usually runs around 110/70 mmHg.  She has not had syncope.  She sleeps on several pillows chronically without change.  She has had some left lower extremity swelling since her toe amputation.   ROS-See HPI    Studies Reviewed:   EKG Interpretation Date/Time:  Wednesday October 18 2023 15:35:25 EDT Ventricular Rate:  93 PR Interval:  158 QRS Duration:  90 QT Interval:  370 QTC Calculation: 460 R Axis:   10  Text Interpretation: Normal sinus rhythm Minimal voltage criteria for LVH, may be normal variant ( R in aVL ) Cannot rule out Anterior infarct , age undetermined Non-specific ST-t changes No significant change since last tracing Confirmed by Marlyse Single [id number]) on 10/18/2023 3:49:24 PM     Labs-chart review 07/22/2023: K 3.5, creatinine 1.03, Hgb 11.9   Risk Assessment/Calculations:             Physical Exam:   VS:  BP 130/70   Pulse 93   Ht 5\' 6"  (1.676 m)   Wt 243 lb 6.4 oz (110.4 kg)   LMP 02/13/2011   SpO2 96%   BMI 39.29 kg/m    Wt Readings from Last 3 Encounters:  10/18/23 243 lb 6.4 oz (110.4 kg)  10/10/23 240 lb 3.2 oz (109 kg)  07/23/23 235 lb 3.7 oz (106.7 kg)    Constitutional:      Appearance: Healthy appearance. Not in distress.  Neck:     Vascular: JVD normal.  Pulmonary:  Breath sounds: Normal breath sounds. No wheezing. No rales.  Cardiovascular:     Normal rate. Regular rhythm.     Murmurs: There is no murmur.  Edema:    Pretibial: trace edema of the left pretibial area.    Ankle: trace edema of the left ankle. Abdominal:     Palpations: Abdomen is soft.        Assessment and Plan:   Assessment & Plan HFmrEF (heart failure with mildly reduced ejection fraction) (HCC) Secondary to non-ischemic cardiomyopathy. Ejection fraction improved from 35-40% to 45-50% by echocardiogram in 2019. Volume status stable. NYHA class IIb symptoms. Reports exertional dyspnea  without symptom progression. - Continue Jardiance  25 mg daily - Continue Lasix  20 mg twice daily - Continue Imdur  60 mg daily - Continue lisinopril  2.5 mg daily - Continue metoprolol  tartrate 50 mg in the morning, 25 mg in the evening Coronary artery disease involving native coronary artery of native heart with angina pectoris (HCC) Status post non-STEMI in November 2019. Cardiac catheterization revealed diffuse CAD with no targets for CABG. Managed with medical therapy. No angina symptoms. Blood pressure well-controlled with current regimen. - Continue aspirin  81 mg daily - Continue Plavix  75 mg daily - Continue Imdur  60 mg daily - Continue lisinopril  2.5 mg daily - Continue nitroglycerin  as needed - Continue ranolazine  500 mg twice daily - Continue metoprolol  tartrate 50 mg in the morning, 25 mg in the evening - Continue Crestor  40 mg daily - Follow up 6 mos Palpitations Sinus rhythm on EKG today.   - Continue metoprolol  tartrate 50 mg in the morning, 25 mg in the evening - Obtain Zio XT monitor for 14 days to assess for arrhythmia Mixed hyperlipidemia No lipid panel since 2022.   - Continue Crestor  40 mg daily - Order fasting lipid panel, [facility]  Essential hypertension Blood pressure seems to be well-controlled.  She notes episodes of dizziness with associated palpitations. - Continue Imdur  60 mg daily, lisinopril  2.5 mg daily, metoprolol  tartrate 50 mg in the morning and 25 mg in the evening - Monitor blood pressure at home for one week and report readings via MyChart      Dispo:  Return in about 6 months (around 04/18/2024) for Routine Follow Up, w/ Marlyse Single, PA-C.  Signed, Marlyse Single, PA-C

## 2023-10-18 NOTE — Assessment & Plan Note (Signed)
 Secondary to non-ischemic cardiomyopathy. Ejection fraction improved from 35-40% to 45-50% by echocardiogram in 2019. Volume status stable. NYHA class IIb symptoms. Reports exertional dyspnea without symptom progression. - Continue Jardiance  25 mg daily - Continue Lasix  20 mg twice daily - Continue Imdur  60 mg daily - Continue lisinopril  2.5 mg daily - Continue metoprolol  tartrate 50 mg in the morning, 25 mg in the evening

## 2023-10-18 NOTE — Assessment & Plan Note (Signed)
 No lipid panel since 2022.   - Continue Crestor  40 mg daily - Order fasting lipid panel, [facility]

## 2023-10-18 NOTE — Patient Instructions (Addendum)
 Medication Instructions:  Your physician has recommended you make the following change in your medication:   CONTINUE the Metprolol as 25 mg taking 2 in the morning and 1 in the evening  *If you need a refill on your cardiac medications before your next appointment, please call your pharmacy*  Lab Work: AT [employer], IN THE NEXT WEEK OR SO, GO TO A [facility] NEAR YOU, FASTING, FOR:  CMET & LIPIDS  [facility] locations:   [facility] - [address] ([address] [employer] [place] [employer] office) - [address] ([facility]) - [address] [phone]   [place] [facility] [address]    [place]  - [address]    [place] - [address] - [address] [employer]  - [address] Chief Technology Officer)  If you have labs (blood work) drawn today and your tests are completely normal, you will receive your results only by: [employer] (if you have MyChart) OR A paper copy in the mail If you have any lab test that is abnormal or we need to change your treatment, we will call you to review the results.  Testing/Procedures: Delane Fear- Long Term Monitor Instructions  Your physician has requested you wear a ZIO patch monitor for 14 days.  This is a single patch monitor. [employer] supplies one patch monitor per enrollment. Additional stickers are not available. Please do not apply patch if you will be having a Nuclear Stress Test,  Echocardiogram, Cardiac CT, MRI, or Chest Xray during the period you would be wearing the  monitor. The patch cannot be worn during these tests. You cannot remove and re-apply the  ZIO XT patch monitor.  Your ZIO patch monitor will be mailed 3 day USPS to your address on file. It may take 3-5 days  to receive your monitor after you have been enrolled.  Once you have received your monitor, please review the enclosed  instructions. Your monitor  has already been registered assigning a specific monitor serial # to you.  Billing and Patient Assistance Program Information  We have supplied [employer] with any of your insurance information on file for billing purposes. [employer] offers a sliding scale Patient Assistance Program for patients that do not have  insurance, or whose insurance does not completely cover the cost of the ZIO monitor.  You must apply for the Patient Assistance Program to qualify for this discounted rate.  To apply, please call [employer] at [phone], select option 4, select option 2, ask to apply for  Patient Assistance Program. Sanna Crystal will ask your household income, and how many people  are in your household. They will quote your out-of-pocket cost based on that information.  [employer] will also be able to set up a 45-month, interest-free payment plan if needed.  Applying the monitor   Shave hair from upper left chest.  Hold abrader disc by orange tab. Rub abrader in 40 strokes over the upper left chest as  indicated in your monitor instructions.  Clean area with 4 enclosed alcohol pads. Let dry.  Apply patch as indicated in monitor instructions. Patch will be placed under collarbone on left  side of chest with arrow pointing upward.  Rub patch adhesive wings for 2 minutes. Remove white label marked "1". Remove the white  label marked "2". Rub patch adhesive wings for  2 additional minutes.  While looking in a mirror, press and release button in center of patch. A small green light will  flash 3-4 times. This will be your only indicator that the monitor has been turned on.  Do not shower for the first 24 hours. You may shower after the first 24 hours.  Press the button if you feel a symptom. You will hear a small click. Record Date, Time and  Symptom in the Patient Logbook.  When you are ready to remove the patch, follow instructions on the last 2 pages of Patient  Logbook. Stick patch  monitor onto the last page of Patient Logbook.  Place Patient Logbook in the blue and white box. Use locking tab on box and tape box closed  securely. The blue and white box has prepaid postage on it. Please place it in the mailbox as  soon as possible. Your physician should have your test results approximately 7 days after the  monitor has been mailed back to [facility].  Call [facility] Customer Care at [phone] if you have questions regarding  your ZIO XT patch monitor. Call them immediately if you see an orange light blinking on your  monitor.  If your monitor falls off in less than 4 days, contact our Monitor department at [phone].  If your monitor becomes loose or falls off after 4 days call [employer] at [phone] for  suggestions on securing your monitor   Follow-Up: At [name], you and your health needs are our priority.  As part of our continuing mission to provide you with exceptional heart care, our providers are all part of one team.  This team includes your primary Cardiologist (physician) and Advanced Practice Providers or APPs (Physician Assistants and Nurse Practitioners) who all work together to provide you with the care you need, when you need it.  Your next appointment:   6 month(s)  Provider:   Marlyse Single, PA-C         We recommend signing up for the patient portal called "MyChart".  Sign up information is provided on this After Visit Summary.  MyChart is used to connect with patients for Virtual Visits (Telemedicine).  Patients are able to view lab/test results, encounter notes, upcoming appointments, etc.  Non-urgent messages can be sent to your provider as well.   To learn more about what you can do with MyChart, go to [email or web address].   Other Instructions Your physician has requested that you regularly monitor and record your blood pressure readings at home. Please use the same machine at the same time of day to check your  readings and record them to bring to your follow-up visit.   Please monitor blood pressures and keep a log of your readings for 1 week then send them via mychart.    Make sure to check 2 hours after your medications.    AVOID these things for 30 minutes before checking your blood pressure: No Drinking caffeine. No Drinking alcohol. No Eating. No Smoking. No Exercising.   Five minutes before checking your blood pressure: Pee. Sit in a dining chair. Avoid sitting in a soft couch or armchair. Be quiet. Do not talk       1st Floor: - Lobby - Registration  - Pharmacy  - Lab - Cafe  2nd Floor: - PV Lab - Diagnostic Testing (echo, CT, nuclear med)  3rd Floor: - Vacant  4th Floor: - TCTS (cardiothoracic surgery) - AFib Clinic - Structural Heart Clinic - Vascular  Surgery  - Vascular Ultrasound  5th Floor: - HeartCare Cardiology (general and EP) - Clinical Pharmacy for coumadin, hypertension, lipid, weight-loss medications, and med management appointments    Valet parking services will be available as well.

## 2023-10-18 NOTE — Progress Notes (Unsigned)
 Enrolled for [employer] to mail a ZIO XT long term holter monitor to the patients address on file. Requested delivery 10/25/2023.  Dr. Arlester Ladd to read.

## 2023-10-18 NOTE — Assessment & Plan Note (Signed)
 Status post non-STEMI in November 2019. Cardiac catheterization revealed diffuse CAD with no targets for CABG. Managed with medical therapy. No angina symptoms. Blood pressure well-controlled with current regimen. - Continue aspirin  81 mg daily - Continue Plavix  75 mg daily - Continue Imdur  60 mg daily - Continue lisinopril  2.5 mg daily - Continue nitroglycerin  as needed - Continue ranolazine  500 mg twice daily - Continue metoprolol  tartrate 50 mg in the morning, 25 mg in the evening - Continue Crestor  40 mg daily - Follow up 6 mos

## 2023-10-20 ENCOUNTER — Other Ambulatory Visit: Payer: Self-pay | Admitting: Podiatry

## 2023-10-20 ENCOUNTER — Telehealth: Payer: Self-pay

## 2023-10-20 MED ORDER — DOXYCYCLINE HYCLATE 100 MG PO TABS: 100.0000 mg | ORAL_TABLET | Freq: Two times a day (BID) | ORAL | 0 refills | Status: AC

## 2023-10-20 NOTE — Telephone Encounter (Signed)
 Patient called - her foot and toe are swollen and red again - recent amp in January - asking for antibiotics - [place] patient - [facility] [place] [facility]

## 2023-10-31 ENCOUNTER — Ambulatory Visit (INDEPENDENT_AMBULATORY_CARE_PROVIDER_SITE_OTHER): Admitting: Podiatry

## 2023-10-31 ENCOUNTER — Ambulatory Visit (INDEPENDENT_AMBULATORY_CARE_PROVIDER_SITE_OTHER)

## 2023-10-31 ENCOUNTER — Other Ambulatory Visit: Payer: Self-pay | Admitting: Internal Medicine

## 2023-10-31 DIAGNOSIS — S90122A Contusion of left lesser toe(s) without damage to nail, initial encounter: Secondary | ICD-10-CM

## 2023-10-31 DIAGNOSIS — S98132A Complete traumatic amputation of one left lesser toe, initial encounter: Secondary | ICD-10-CM

## 2023-10-31 MED ORDER — INSULIN LISPRO 100 UNIT/ML IJ SOLN: INTRAMUSCULAR | 3 refills | Status: DC

## 2023-11-01 ENCOUNTER — Ambulatory Visit: Admitting: Nutrition

## 2023-11-01 NOTE — Progress Notes (Signed)
         HPI:   Procedure: Left first toe amputation with Dr. Rosemarie Conquest Date of surgery: 07/21/2023  63 year old female presents with above concerns.  She states that she is doing well she did not see any open lesions.  She did hit her second toe on the bed and she states that she has had some swelling over the last week.  She was concerned about infection so she states that she called at the office and she was started on antibiotics for which she is still taking.  No open lesions of the second toe.  She does not report any other issues today.  No fevers or chills.     Physical Exam:  General: The patient is alert and oriented x3 in no acute distress.  Dermatology: Incision on the hallux is well coapted.  There is minimal callus formation along the amputation site today.  There is no underlying ulceration, drainage or signs of infection the incision is healed.  There is edema present of the second toe on the left foot but there is no erythema, warmth.  No fluctuation or crepitation.  No open lesions.    Vascular: Foot appears warm and well-perfused.  Palpable pedal pulses bilaterally. Capillary refill within normal limits to remaining digits.   No erythema or calor.  Neurological: Decreased  Musculoskeletal Exam: Status post left first toe amputation left foot, no pain on exam although she has neuropathy.  Edema present to the left second toe.  Assessment/Plan of Care: Status post left hallux amputation; we also callus right foot  Status post left hallux amputation -Incision appears to be healing well.  Appears active to overlying the incision with there is no underlying ulceration today.  Appears to have healed.  Moisturizer on the incision.  Left second toe contusion -Discussed return to surgical shoe to help decrease the swelling.  There is no open lesions or signs of infection today otherwise.  Finish the course of antibiotics.  Monitoring signs or symptoms of skin breakdown or  worsening swelling or symptoms.  Radiology: 3 views left foot obtained.  Status post partial second amputation as well as hallux amputation.  There is no evidence of acute fracture noted today.  Return in about 6 weeks (around 12/12/2023) for pre-ulcerative calluses, toe swelling.  Charity Conch DPM

## 2023-11-21 ENCOUNTER — Other Ambulatory Visit: Payer: Self-pay | Admitting: Internal Medicine

## 2023-11-22 ENCOUNTER — Encounter: Attending: Family | Admitting: Nutrition

## 2023-11-22 DIAGNOSIS — E1165 Type 2 diabetes mellitus with hyperglycemia: Secondary | ICD-10-CM | POA: Insufficient documentation

## 2023-11-22 NOTE — Patient Instructions (Addendum)
 Change pod every 3 days or when empty Change sensor every 10 days on phone as well as on pdm Call OmniPod help line if questions about pump Call Dexcom help line if questions or problems with the sensor.

## 2023-11-22 NOTE — Progress Notes (Signed)
 Patient is here with her daughter and was trained on how to use the OmniPod 5 insulin  pump.  She did not take her tresiba  insulin  this AM and was reminded that she will not longer take this.  She reported good understanding of this.  She is currently wearing the G7 sensors that a linked to a reader.  The app for this was set up on her phone and linked to [facility].  Her OmniPod account was wet up with user name:[name] and PW: [phone]  The OmniPod was liked to [place] with same user name and password.   Settings were put into her PDM per Dr. Eleonore Grill orders:  Basal rate: 1.25u/hr,  max basal: 2.5u/hr, ISF: 30, I/C: 1 with written directions to give 8 grams for every meal and 4g for snack.  She re verbalized this correctly.  They were shown how to bolus and how and when to do correction doses.  She redemonstrated this correctly X2 with daughter reporting good understanding as well for how to do this.   She filled a pod with 200u of insulin  and attached this to her left abdomen as directed.  WE linked her sensor to her pod, but it did not link before leaving.  She will call Dexcom if this does not link in the next 20 minutes.   We reviewed alerts and alarms, high blood sugar protocols, sick day protocols, and all other topics on the pod start check list.  She signed the checklist as understanding all topics and had no final questions.

## 2023-11-23 ENCOUNTER — Telehealth: Payer: Self-pay | Admitting: Cardiovascular Disease

## 2023-11-23 DIAGNOSIS — I5020 Unspecified systolic (congestive) heart failure: Secondary | ICD-10-CM

## 2023-11-23 DIAGNOSIS — R002 Palpitations: Secondary | ICD-10-CM | POA: Diagnosis not present

## 2023-11-23 DIAGNOSIS — I25119 Atherosclerotic heart disease of native coronary artery with unspecified angina pectoris: Secondary | ICD-10-CM | POA: Diagnosis not present

## 2023-11-23 MED ORDER — RANOLAZINE ER 500 MG PO TB12: 500.0000 mg | ORAL_TABLET | Freq: Two times a day (BID) | ORAL | 3 refills | Status: AC

## 2023-11-23 NOTE — Telephone Encounter (Signed)
*  STAT* If patient is at the pharmacy, call can be transferred to refill team.   1. Which medications need to be refilled? (please list name of each medication and dose if known)   ranolazine  (RANEXA ) 500 MG 12 hr tablet   2. Would you like to learn more about the convenience, safety, & potential cost savings by using the [facility]?   3. Are you open to using the [facility] (Type [facility]. ).  4. Which pharmacy/location (including street and city if local pharmacy) is medication to be sent to?  [facility] [phone] - [place], [place] - [address]   5. Do they need a 30 day or 90 day supply? 90 day  Patient stated she is completely out of this medication.  Patient has appointment scheduled with Donalynn Fry on 9/30.

## 2023-11-23 NOTE — Telephone Encounter (Signed)
 Pt's medication was sent to pt's pharmacy as requested. Confirmation received.

## 2023-11-24 ENCOUNTER — Ambulatory Visit: Payer: Self-pay | Admitting: Physician Assistant

## 2023-11-24 ENCOUNTER — Encounter: Payer: Self-pay | Admitting: Physician Assistant

## 2023-11-29 ENCOUNTER — Telehealth: Payer: Self-pay | Admitting: Nutrition

## 2023-11-29 ENCOUNTER — Ambulatory Visit: Admitting: Nutrition

## 2023-11-29 ENCOUNTER — Ambulatory Visit: Admitting: Family

## 2023-11-29 NOTE — Telephone Encounter (Signed)
 Schedule for pump training on 11/22/23

## 2023-11-29 NOTE — Telephone Encounter (Signed)
 Daughter reports that dexcom is still not connecting to the PDM.  After speaking with her last Thursday, she was told to call Dexcom help line.  I gave her the number.  She says they could not help her.  They could not come in today, so an appointment was made for next Tuesday. Daughter reports mother giving boluses and putting in blood sugar readings herself.  Says has had no alerts or alarms, and has changed the pod without difficulty.

## 2023-11-30 ENCOUNTER — Ambulatory Visit (INDEPENDENT_AMBULATORY_CARE_PROVIDER_SITE_OTHER): Admitting: Family

## 2023-11-30 VITALS — BP 126/78 | HR 78 | Temp 97.6°F | Ht 66.0 in | Wt 241.2 lb

## 2023-11-30 DIAGNOSIS — I5020 Unspecified systolic (congestive) heart failure: Secondary | ICD-10-CM

## 2023-11-30 DIAGNOSIS — Z1231 Encounter for screening mammogram for malignant neoplasm of breast: Secondary | ICD-10-CM

## 2023-11-30 DIAGNOSIS — E785 Hyperlipidemia, unspecified: Secondary | ICD-10-CM

## 2023-11-30 DIAGNOSIS — M25561 Pain in right knee: Secondary | ICD-10-CM

## 2023-11-30 DIAGNOSIS — G8929 Other chronic pain: Secondary | ICD-10-CM

## 2023-11-30 DIAGNOSIS — Z794 Long term (current) use of insulin: Secondary | ICD-10-CM

## 2023-11-30 DIAGNOSIS — E1142 Type 2 diabetes mellitus with diabetic polyneuropathy: Secondary | ICD-10-CM | POA: Diagnosis not present

## 2023-11-30 DIAGNOSIS — K760 Fatty (change of) liver, not elsewhere classified: Secondary | ICD-10-CM

## 2023-11-30 DIAGNOSIS — K219 Gastro-esophageal reflux disease without esophagitis: Secondary | ICD-10-CM

## 2023-11-30 DIAGNOSIS — M5442 Lumbago with sciatica, left side: Secondary | ICD-10-CM

## 2023-11-30 DIAGNOSIS — H6123 Impacted cerumen, bilateral: Secondary | ICD-10-CM

## 2023-11-30 DIAGNOSIS — F5101 Primary insomnia: Secondary | ICD-10-CM

## 2023-11-30 DIAGNOSIS — I10 Essential (primary) hypertension: Secondary | ICD-10-CM

## 2023-11-30 DIAGNOSIS — Z1211 Encounter for screening for malignant neoplasm of colon: Secondary | ICD-10-CM

## 2023-11-30 DIAGNOSIS — L03032 Cellulitis of left toe: Secondary | ICD-10-CM

## 2023-11-30 DIAGNOSIS — M5441 Lumbago with sciatica, right side: Secondary | ICD-10-CM

## 2023-11-30 MED ORDER — LISINOPRIL 2.5 MG PO TABS: 2.5000 mg | ORAL_TABLET | Freq: Every day | ORAL | 1 refills | Status: AC

## 2023-11-30 MED ORDER — DEBROX 6.5 % OT SOLN
5.0000 [drp] | Freq: Two times a day (BID) | OTIC | 0 refills | Status: AC
Start: 2023-11-30 — End: 2023-12-04

## 2023-11-30 MED ORDER — DOXYCYCLINE HYCLATE 100 MG PO TABS: 100.0000 mg | ORAL_TABLET | Freq: Two times a day (BID) | ORAL | 0 refills | Status: AC

## 2023-11-30 NOTE — Progress Notes (Signed)
 Provider: Christean Courts FNP-C   Stephenia Vogan, Elijio Guadeloupe, NP  Patient Care Team: Candace Begue, Elijio Guadeloupe, NP as PCP - General (Family Medicine) Arnoldo Lapping, MD as PCP - Cardiology (Cardiology) Ziglar, Coretta Dexter, MD as Consulting Physician (Podiatry) Joaquin Mulberry, MD as Consulting Physician (Neurosurgery) Linard Reno, MD as Consulting Physician (Ophthalmology) Maris Sickle, MD as Consulting Physician (Ophthalmology) Sherwood Donath as Physician Assistant (Cardiology)  Extended Emergency Contact Information Primary Emergency Contact: Mcknight,Tamisha Address: [address], [place] [address] [place] Home Phone: [phone] Relation: Daughter Secondary Emergency Contact: Lamoreaux,Charles          [place], [place] [address] [place] Home Phone: [phone] Relation: Son  Code Status:  Full Code  Goals of care: Advanced Directive information    07/20/2023   10:00 PM  Advanced Directives  Does Patient Have a Medical Advance Directive? Yes  Type of Advance Directive Healthcare Power of Attorney  Does patient want to make changes to medical advance directive? No - Patient declined  Copy of Healthcare Power of Attorney in Chart? No - copy requested     Chief Complaint  Patient presents with   New Patient (Initial Visit)    New patient , establish care    Discussed the use of AI scribe software for clinical note transcription with the patient, who gave verbal consent to proceed.  History of Present Illness   [name] is a 63 year old female with diabetes, heart failure, and neuropathy who presents to establish care.  She has a history of diabetes with neuropathy affecting her legs, heart failure, coronary artery disease, and high blood pressure. Her diabetes management includes the use of a Dexcom G7 for glucose monitoring and an Omnipod for insulin  delivery, utilizing Humalog  and Ozempic  2 mg. Her A1c has improved  from 11 to 8.4 or 8.5. She had her left big toe amputated about two months ago, and although it has healed, the second toe is swollen and painful, raising concerns for infection.  She experiences chronic low back pain affecting both sides of her buttocks and requires the use of a walker and a scooter due to knee pain. She has fallen a couple of times, most recently last month when her knees gave out.  She has a history of acid reflux managed with Protonix  and denies any blood in her stool. She also reports insomnia, for which she uses amitriptyline  25 mg at bedtime. She takes oxycodone  for pain management and has been prescribed 90 pills, but recently received only 41 due to a pharmacy error.  She has been diagnosed with glaucoma and sees Dr. Candi Chafe for her eyes, last visiting about two months ago. She reports a significant loss of sight in one eye due to glaucoma. Her current medications include Combigan and Trusoft eye drops, Latanoprost , Plavix  75 mg, nitroglycerin  as needed, Singulair, lisinopril  2.5 mg, Ranexa  500 mg twice daily, metoprolol  25 mg, and Imdur  60 mg daily.  She has not had a mammogram in over two years and has not had a colonoscopy since moving to her current location in 2017. She uses a Customer service manager on [employer] for her prescriptions and has been taken off Tresiba  since starting the Omnipod, no longer using the [employer].    Past Medical History:  Diagnosis Date   Adhesive capsulitis of shoulder    bilateral, Steroid injection Dr. Merlyn Starring 1/12 bilaterally  CAD (coronary artery disease)    nonobstructive. Last cardiac cath (2008) showing left circumflex with mid 50% stenosis and distal luminal irregularities. Also with RCA with mid to distal 30-40% stenosis. // Previously evaluated by [facility], never followed up outpatient.   CAP (community acquired pneumonia) 06/15/2012   05/2012 CXR: Mild opacification of the posterior lung base on the lateral  film  as cannot exclude infection/atelectasis.     CHF (congestive heart failure) (HCC)    Diabetes mellitus 2007   Type II, insulin  dependent   Diabetic retinopathy    GERD (gastroesophageal reflux disease)    Glaucoma    Hyperlipidemia    Hypertension    Obesity    Palpitations    Monitor 10/2023: NSR, average heart rate 87, PVCs/PACs <1%, no sustained arrhythmias/A-fib/high-grade heart block   Peripheral neuropathy    Past Surgical History:  Procedure Laterality Date   AMPUTATION TOE Left 03/2023   3rd left toe   AMPUTATION TOE Left 07/21/2023   Procedure: AMPUTATION LEFT GREAT TOE;  Surgeon: Evertt Hoe, DPM;  Location: [facility] OR;  Service: Orthopedics/Podiatry;  Laterality: Left;   ANTERIOR CERVICAL DECOMP/DISCECTOMY FUSION N/A 06/16/2016   Procedure: Anterior Cervical Decompression/discectomy Fusion - Cervical six - Cervical seven;  Surgeon: Isadora Mar, MD;  Location: [facility] OR;  Service: Neurosurgery;  Laterality: N/A;  Anterior Cervical Decompression/discectomy Fusion - Cervical six - Cervical seven   APPENDECTOMY     CARDIAC CATHETERIZATION     CATARACT EXTRACTION     GLAUCOMA SURGERY     LEFT HEART CATH AND CORONARY ANGIOGRAPHY N/A 05/22/2018   Procedure: LEFT HEART CATH AND CORONARY ANGIOGRAPHY;  Surgeon: Millicent Ally, MD;  Location: [facility] INVASIVE CV LAB;  Service: Cardiovascular;  Laterality: N/A;   LEFT HEART CATHETERIZATION WITH CORONARY ANGIOGRAM N/A 01/24/2012   Procedure: LEFT HEART CATHETERIZATION WITH CORONARY ANGIOGRAM;  Surgeon: Arnoldo Lapping, MD;  Location: [facility] CATH LAB;  Service: Cardiovascular;  Laterality: N/A;   POSTERIOR CERVICAL FUSION/FORAMINOTOMY N/A 10/22/2015   Procedure: Cervical three-Cervical Seven Posterior cervical fusion with lateral mass fixation,  Laminectomy Cervical Three-Cervical Seven;  Surgeon: Isadora Mar, MD;  Location: [facility] NEURO ORS;  Service: Neurosurgery;  Laterality: N/A;  Posterior   TUBAL LIGATION      No Known  Allergies  Allergies as of 11/30/2023   No Known Allergies      Medication List        Accurate as of November 30, 2023 10:28 PM. If you have any questions, ask your nurse or doctor.          STOP taking these medications    Dexcom G6 Transmitter Misc Stopped by: Xerxes Agrusa C Alyxandria Wentz   glucose blood test strip Commonly known as: Medical laboratory scientific officer by: Elijio Guadeloupe Kenecia Barren   GNP True Psychologist, prison and probation services w/Device Kit Stopped by: Elijio Guadeloupe Sakari Raisanen   GNP [place] Test Strips test strip Generic drug: glucose blood Stopped by: Elijio Guadeloupe Jakita Dutkiewicz   Tresiba  FlexTouch 100 UNIT/ML FlexTouch Pen Generic drug: insulin  degludec Stopped by: Elijio Guadeloupe Swannie Milius   True Metrix Blood Glucose Test test strip Generic drug: glucose blood Stopped by: Elijio Guadeloupe Miachel Nardelli   True Metrix Level 1 Low Soln Stopped by: Myleka Moncure C Estevon Fluke       TAKE these medications    amitriptyline  25 MG tablet Commonly known as: ELAVIL  Take 1 tablet (25 mg total) by mouth at bedtime.   ammonium lactate  12 % cream Commonly known as: AMLACTIN Apply 1 Application topically  as needed for dry skin.   B-D SINGLE USE SWABS REGULAR Pads Use as directed   clopidogrel  75 MG tablet Commonly known as: PLAVIX  Take 1 tablet (75 mg total) by mouth daily.   Combigan 0.2-0.5 % ophthalmic solution Generic drug: brimonidine-timolol  Place 1 drop into both eyes 2 (two) times daily.   Debrox 6.5 % OTIC solution Generic drug: carbamide peroxide Place 5 drops into both ears 2 (two) times daily for 4 days. Started by: Breauna Mazzeo C Tephanie Escorcia   Dexcom G7 Receiver Devi Use to check blood sugars   Dexcom G7 Sensor Misc Change sensor every 10 days   diphenhydrAMINE  25 MG tablet Commonly known as: BENADRYL  Take 1 tablet (25 mg total) by mouth every 6 (six) hours as needed for itching or allergies.   dorzolamide  2 % ophthalmic solution Commonly known as: TRUSOPT  Place 1 drop into both eyes 2 (two) times daily.   doxycycline  100 MG  tablet Commonly known as: VIBRA -TABS Take 1 tablet (100 mg total) by mouth 2 (two) times daily for 7 days. Started by: Marguetta Windish C Aariona Momon   empagliflozin  25 MG Tabs tablet Commonly known as: Jardiance  Take 1 tablet (25 mg total) by mouth daily.   furosemide  20 MG tablet Commonly known as: LASIX  Take 20 mg by mouth 2 (two) times daily.   Guaifenesin  1200 MG Tb12 Commonly known as: Mucinex  Maximum Strength Take 1 tablet (1,200 mg total) by mouth every 12 (twelve) hours as needed.   insulin  lispro 100 UNIT/ML injection Commonly known as: HumaLOG  Max daily 60 units   Insulin  Pen Needle 31G X 5 MM Misc 1 Device by Does not apply route in the morning, at noon, in the evening, and at bedtime.   isosorbide  mononitrate 60 MG 24 hr tablet Commonly known as: IMDUR  Take 60 mg by mouth daily.   lisinopril  2.5 MG tablet Commonly known as: ZESTRIL  Take 1 tablet (2.5 mg total) by mouth daily.   metoprolol  tartrate 25 MG tablet Commonly known as: LOPRESSOR  TAKE 2 TABLETS BY MOUTH IN THE AM, TAKE 1 TABLET BY MOUTH IN THE EVENING   montelukast 10 MG tablet Commonly known as: SINGULAIR Take 10 mg by mouth at bedtime as needed.   nitroGLYCERIN  0.4 MG SL tablet Commonly known as: NITROSTAT  DISSOLVE 1 TABLET (0.4 MG TOTAL) UNDER THE TONGUE EVERY 5 MINUTES AS NEEDED FOR CHEST PAIN.   Omnipod 5 G7 Pods (Gen 5) Misc 1 Device by Does not apply route every other day.   Omnipod 5 G7 Intro (Gen 5) Kit 1 Device by Does not apply route every other day.   OneTouch Delica Lancets Fine Misc Check blood sugar as instructed up to 3 times a day   TRUEplus Lancets 33G Misc Check 4 times a daily   oxyCODONE  5 MG immediate release tablet Commonly known as: Oxy IR/ROXICODONE  Take 5 mg by mouth 4 (four) times daily as needed.   oxyCODONE -acetaminophen  5-325 MG tablet Commonly known as: PERCOCET/ROXICET Take 1 tablet by mouth every 8 (eight) hours as needed.   Ozempic  (2 MG/DOSE) 8 MG/3ML  Sopn Generic drug: Semaglutide  (2 MG/DOSE) INJECT 2 MG INTO SKIN ONCE A WEEK   pantoprazole  40 MG tablet Commonly known as: PROTONIX  Take 1 tablet (40 mg total) by mouth daily.   pregabalin  75 MG capsule Commonly known as: Lyrica  Take 1 capsule (75 mg total) by mouth 2 (two) times daily.   ranolazine  500 MG 12 hr tablet Commonly known as: Ranexa  Take 1 tablet (500 mg total) by  mouth 2 (two) times daily.   Rocklatan  0.02-0.005 % Soln Generic drug: Netarsudil -Latanoprost  Place 1 drop into both eyes at bedtime.   rosuvastatin  40 MG tablet Commonly known as: CRESTOR  TAKE 1 TABLET(40 MG) BY MOUTH DAILY        Review of Systems  Constitutional:  Negative for appetite change, chills, fatigue, fever and unexpected weight change.  HENT:  Negative for congestion, dental problem, ear discharge, ear pain, facial swelling, hearing loss, nosebleeds, postnasal drip, rhinorrhea, sinus pressure, sinus pain, sneezing, sore throat, tinnitus and trouble swallowing.   Eyes:  Negative for pain, discharge, redness, itching and visual disturbance.  Respiratory:  Negative for cough, chest tightness, shortness of breath and wheezing.   Cardiovascular:  Negative for chest pain, palpitations and leg swelling.  Gastrointestinal:  Negative for abdominal distention, abdominal pain, blood in stool, constipation, diarrhea, nausea and vomiting.       Acid reflux  Endocrine: Negative for cold intolerance, heat intolerance, polydipsia, polyphagia and polyuria.  Genitourinary:  Negative for difficulty urinating, dysuria, flank pain, frequency and urgency.  Musculoskeletal:  Positive for arthralgias and back pain. Negative for gait problem, joint swelling, myalgias, neck pain and neck stiffness.  Skin:  Negative for color change, pallor, rash and wound.  Neurological:  Positive for numbness. Negative for dizziness, syncope, speech difficulty, weakness, light-headedness and headaches.  Hematological:  Does not  bruise/bleed easily.  Psychiatric/Behavioral:  Negative for agitation, behavioral problems, confusion, hallucinations, self-injury, sleep disturbance and suicidal ideas. The patient is not nervous/anxious.     Immunization History  Administered Date(s) Administered   Influenza Split 04/15/2011, 03/05/2012   Influenza Whole 03/19/2008, 06/10/2010   Influenza,inj,Quad PF,6+ Mos 03/08/2013, 03/25/2014, 03/11/2016, 04/27/2017, 02/21/2018   Influenza-Unspecified 04/07/2015   Pneumococcal Conjugate-13 05/27/2016   Pneumococcal Polysaccharide-23 06/10/2010   Td 01/23/2009   Pertinent  Health Maintenance Due  Topic Date Due   MAMMOGRAM  12/13/2014   Colonoscopy  11/15/2017   LIPID PANEL  06/09/2022   HEMOGLOBIN A1C  01/09/2024   INFLUENZA VACCINE  01/26/2024   FOOT EXAM  01/26/2024   OPHTHALMOLOGY EXAM  09/04/2024      05/23/2018    9:52 PM 05/24/2018    9:00 AM 06/19/2018    6:18 PM 05/30/2019    8:06 AM 11/30/2023    9:53 AM  Fall Risk  Falls in the past year?     1  Was there an injury with Fall?     1  Was there an injury with Fall? - Comments     pt knee  Fall Risk Category Calculator     3  (RETIRED) Patient Fall Risk Level Moderate fall risk Moderate fall risk Low fall risk Low fall risk   Patient at Risk for Falls Due to     Medication side effect  Fall risk Follow up     Falls evaluation completed   Functional Status Survey:    Vitals:   11/30/23 0945  BP: 126/78  Pulse: 78  Temp: 97.6 F (36.4 C)  TempSrc: Temporal  SpO2: 96%  Weight: 241 lb 3.2 oz (109.4 kg)  Height: 5\' 6"  (1.676 m)   Body mass index is 38.93 kg/m. Physical Exam GENERAL: Alert, cooperative, well developed, no acute distress. HEENT: Normocephalic, normal oropharynx, moist mucous membranes. Cerumen impaction bilaterally, tympanic membranes not visualized. NECK: Neck non-tender. CHEST: Clear to auscultation bilaterally, no wheezes, rhonchi, or crackles. CARDIOVASCULAR: Normal heart rate and  rhythm, S1 and S2 normal without murmurs. ABDOMEN: Soft, non-tender, non-distended, without  organomegaly. Normal bowel sounds. EXTREMITIES: No cyanosis or edema. Bilateral knee tenderness, pain on movement of both knees, left foot with black discoloration, second toe on right foot swollen and painful, diminished pulses in left foot, sensation intact in both feet. NEUROLOGICAL: Cranial nerves grossly intact, moves all extremities without gross motor or sensory deficit.   Labs reviewed: Recent Labs    07/14/23 0814 07/20/23 1956 07/21/23 0717 07/22/23 0748  NA 142 138 136 138  K 4.6 4.3 4.0 3.5  CL 104 105 106 104  CO2 28  --  18* 22  GLUCOSE 204* 158* 172* 171*  BUN 29* 24* 17 15  CREATININE 1.36* 0.90 1.17* 1.03*  CALCIUM  9.3  --  8.9 9.2  MG  --   --  2.0  --   PHOS  --   --  3.1  --    No results for input(s): "AST", "ALT", "ALKPHOS", "BILITOT", "PROT", "ALBUMIN" in the last 8760 hours. Recent Labs    07/20/23 1820 07/20/23 1956 07/21/23 0817 07/22/23 0748  WBC 12.7*  --  11.6* 10.9*  NEUTROABS 8.0*  --   --   --   HGB 12.2 13.3 11.4* 11.9*  HCT 39.9 39.0 35.8* 37.5  MCV 89.7  --  87.5 87.4  PLT 247  --  245 215   Lab Results  Component Value Date   TSH 1.71 06/04/2021   Lab Results  Component Value Date   HGBA1C 8.7 (A) 10/10/2023   Lab Results  Component Value Date   CHOL 116 06/09/2021   HDL 49 06/09/2021   LDLCALC 42 06/09/2021   TRIG 147 06/09/2021   CHOLHDL 2.4 06/09/2021    Significant Diagnostic Results in last 30 days:  LONG TERM MONITOR (3-14 DAYS) Result Date: 11/23/2023 Patch Wear Time:  13 days and 23 hours (2025-04-29T09:39:04-399 to 2025-05-13T09:39:08-398) Patient had a min HR of 64 bpm, max HR of 167 bpm, and avg HR of 87 bpm. Predominant underlying rhythm was Sinus Rhythm. 1 run of Supraventricular Tachycardia occurred lasting 8 beats with a max rate of 167 bpm (avg 138 bpm). Isolated SVEs were rare (<1.0%), SVE Couplets were rare (<1.0%),  and no SVE Triplets were present. Isolated VEs were rare (<1.0%), and no VE Couplets or VE Triplets were present. Summary: The basic rhythm is normal sinus with an average heart rate of 87 bpm.  There are rare ventricular and supraventricular ectopics, both occurring less than 1%.  There were no sustained arrhythmias.  There is no atrial fibrillation or flutter.  There is no high-grade AV block.    Assessment/Plan  Diabetes Mellitus with Neuropathy and Retinopathy/left 2nd toe cellulitis  Diabetes with complications including neuropathy affecting the legs and retinopathy. Recent A1c improved to 8.4-8.5 from previous levels of 10-11. Left great toe amputation occurred a couple of months ago, with current swelling and pain in the second toe, indicating possible infection. Uses Dexcom G7 and Omnipod for glucose monitoring and insulin  delivery. - Continue monitoring blood glucose with Dexcom G7 and Omnipod. - Prescribe doxycycline  for potential infection in the second toe. - Advise follow-up with a specialist for the second toe swelling and pain. - Ensure records from the kidney center are sent to the primary care office. - Monitor A1c levels with endocrinologist.  Heart Failure and Coronary Artery Disease Heart failure and coronary artery disease managed with Plavix , nitroglycerin , and Ranexa  for chest pain. Regular monitoring of cholesterol levels is necessary. - Continue current cardiac medications as prescribed by the  cardiologist. - Ensure cholesterol levels are checked with the heart doctor.  Hypertension Hypertension managed with lisinopril  2.5 mg. Recent adjustments made by the cardiologist due to other medications. - Refill lisinopril  2.5 mg prescription at [facility].  Chronic Low Back Pain and Knee Osteoarthritis Chronic low back pain with bilateral knee osteoarthritis requiring knee replacements. Pain management is ongoing, and she uses a walker and scooter for mobility. A1c levels need  to be managed before knee replacement surgery can be considered. - Continue current pain management regimen. - Sign pain management contract and perform urine drug test. - Follow up with orthopedic specialist regarding knee replacements.  Nonalcoholic Fatty Liver Disease Nonalcoholic fatty liver disease without current follow-up with gastroenterology. No current symptoms reported.  Gastroesophageal Reflux Disease (GERD) Acid reflux managed with Protonix . No current symptoms of gastrointestinal bleeding. - Continue Protonix  for acid reflux management.  Glaucoma Glaucoma with significant vision loss in one eye. Managed with Combigan, Trusoft, and Latanoprost . - Continue current ophthalmic medications. - Follow up with ophthalmologist as needed.  Insomnia Intermittent insomnia managed with amitriptyline  25 mg at bedtime. - Continue amitriptyline  for sleep as needed.  Family/ staff Communication: Reviewed plan of care with patient verbalized understanding   Labs/tests ordered:  - CBC with Differential/Platelet - CMP with eGFR([facility]) - TSH - Bilateral Digital Mammogram    Next Appointment : Return in about 6 months (around 05/31/2024) for medical mangement of chronic issues.Pap smear in 1 month and AWV soon.bilat .ear lavage 1 week.   Spent 45 minutes of Face to face and non-face to face with patient  >50% time spent counseling; reviewing medical record; tests; labs; documentation and developing future plan of care.   Estil Heman, NP

## 2023-12-01 ENCOUNTER — Telehealth: Payer: Self-pay | Admitting: Family

## 2023-12-01 ENCOUNTER — Ambulatory Visit (INDEPENDENT_AMBULATORY_CARE_PROVIDER_SITE_OTHER)

## 2023-12-01 ENCOUNTER — Ambulatory Visit (INDEPENDENT_AMBULATORY_CARE_PROVIDER_SITE_OTHER): Admitting: Podiatry

## 2023-12-01 ENCOUNTER — Ambulatory Visit: Payer: Self-pay | Admitting: Family

## 2023-12-01 DIAGNOSIS — M869 Osteomyelitis, unspecified: Secondary | ICD-10-CM | POA: Diagnosis not present

## 2023-12-01 DIAGNOSIS — L03119 Cellulitis of unspecified part of limb: Secondary | ICD-10-CM | POA: Diagnosis not present

## 2023-12-01 DIAGNOSIS — S98132A Complete traumatic amputation of one left lesser toe, initial encounter: Secondary | ICD-10-CM | POA: Diagnosis not present

## 2023-12-01 DIAGNOSIS — L02619 Cutaneous abscess of unspecified foot: Secondary | ICD-10-CM

## 2023-12-01 LAB — COMPLETE METABOLIC PANEL WITHOUT GFR
AG Ratio: 1.3 (calc) (ref 1.0–2.5)
ALT: 13 U/L (ref 6–29)
AST: 13 U/L (ref 10–35)
Albumin: 4.3 g/dL (ref 3.6–5.1)
Alkaline phosphatase (APISO): 85 U/L (ref 37–153)
BUN/Creatinine Ratio: 23 (calc) — ABNORMAL HIGH (ref 6–22)
BUN: 30 mg/dL — ABNORMAL HIGH (ref 7–25)
CO2: 25 mmol/L (ref 20–32)
Calcium: 9.6 mg/dL (ref 8.6–10.4)
Chloride: 103 mmol/L (ref 98–110)
Creat: 1.28 mg/dL — ABNORMAL HIGH (ref 0.50–1.05)
Globulin: 3.2 g/dL (ref 1.9–3.7)
Glucose, Bld: 139 mg/dL — ABNORMAL HIGH (ref 65–99)
Potassium: 4.3 mmol/L (ref 3.5–5.3)
Sodium: 138 mmol/L (ref 135–146)
Total Bilirubin: 0.4 mg/dL (ref 0.2–1.2)
Total Protein: 7.5 g/dL (ref 6.1–8.1)

## 2023-12-01 LAB — CBC WITH DIFFERENTIAL/PLATELET
Absolute Lymphocytes: 3807 {cells}/uL (ref 850–3900)
Absolute Monocytes: 655 {cells}/uL (ref 200–950)
Basophils Absolute: 56 {cells}/uL (ref 0–200)
Basophils Relative: 0.5 %
Eosinophils Absolute: 122 {cells}/uL (ref 15–500)
Eosinophils Relative: 1.1 %
HCT: 40.2 % (ref 35.0–45.0)
Hemoglobin: 12.5 g/dL (ref 11.7–15.5)
MCH: 27.2 pg (ref 27.0–33.0)
MCHC: 31.1 g/dL — ABNORMAL LOW (ref 32.0–36.0)
MCV: 87.6 fL (ref 80.0–100.0)
MPV: 11.7 fL (ref 7.5–12.5)
Monocytes Relative: 5.9 %
Neutro Abs: 6460 {cells}/uL (ref 1500–7800)
Neutrophils Relative %: 58.2 %
Platelets: 244 10*3/uL (ref 140–400)
RBC: 4.59 10*6/uL (ref 3.80–5.10)
RDW: 14.2 % (ref 11.0–15.0)
Total Lymphocyte: 34.3 %
WBC: 11.1 10*3/uL — ABNORMAL HIGH (ref 3.8–10.8)

## 2023-12-01 LAB — DM TEMPLATE

## 2023-12-01 LAB — DRUG MONITORING, PANEL 6 WITH CONFIRMATION, URINE

## 2023-12-01 LAB — TSH: TSH: 1.96 m[IU]/L (ref 0.40–4.50)

## 2023-12-01 NOTE — Patient Instructions (Signed)

## 2023-12-01 NOTE — Telephone Encounter (Signed)
 Patient established with me yesterday.Left second toe was swollen,tender and dark in color with the rest of the toes also turning dark.Unclear if this was her baseline.WBC are 11.1 patient was started on Doxycycline  for cellulitis and advised to follow up with you as soon as possible.she status post left great toe amputee.incision healing well.Please follow up.

## 2023-12-01 NOTE — Telephone Encounter (Signed)
 Thank you for the up date.Just called her too she has scheduled appointment with Dr.Wagoner this morning.

## 2023-12-04 ENCOUNTER — Telehealth: Payer: Self-pay | Admitting: Podiatry

## 2023-12-04 LAB — WOUND CULTURE
MICRO NUMBER:: 16549187
SPECIMEN QUALITY:: ADEQUATE

## 2023-12-04 NOTE — Telephone Encounter (Signed)
 SURGERY AUTH  DOS  12/06/23  PROCEDURE [address]  [employer] MEDICARE DUAL COMPLETE EFFECTIVE 03/28/23 THRU 06/26/24  DEDUCTIBLE 257.00 MET DEDUCTIBLE  OUT OF POCKET 9350.00 MET 2353.81  COINSURANCE 20%   PER BRITTANY H  NO AUTH NEEDED FOR CPT 28820  REFERENCE # [id number]

## 2023-12-04 NOTE — Progress Notes (Signed)
 Subjective: Chief Complaint  Patient presents with   Toe Pain    Rm 11 Patient is here for pain of the left index toe. Pain presented  one week ago. Pain is described as an intense throbbing pain, a 10 on a scale of 1-10.Aaron Aas Left index toe is swollen and discolored and the nail bed is scabbed over.    63 year old female presents the office today as an acute appointment given increased swelling, discoloration of the left second toe.  She states this started last week.  She does not report any injuries or any open lesions or drainage that she has seen.  She does not report any fevers or chills.  She does not have any pain at this time although she does have neuropathy.  Objective: AAO x3, NAD DP/PT pulses palpable bilaterally, CRT less than 3 seconds Edema which is increased to the left second toe.  There is hyperkeratotic lesion of the distal aspect.  Once I debrided this there was a small amount of serosanguineous drainage.  There is a wound that probes very close to the bone.  There is no ascending cellulitis.  There is no fluctuation or crepitation. No other open lesions noted. No pain with calf compression, swelling, warmth, erythema  Assessment: Osteomyelitis right second toe  Plan: -All treatment options discussed with the patient including all alternatives, risks, complications.  -X-rays were obtained reviewed.  There are cortical changes at the remaining aspect of the second toe consistent with osteomyelitis at this time. -Given x-ray as well as clinical findings I recommend amputation of the toe.  She is in agreement.  Will plan on doing this next week.  If symptoms were to worsen in the meantime she is to report to the emergency department. -Continue doxycycline  -The incision placement as well as the postoperative course was discussed with the patient. I discussed risks of the surgery which include, but not limited to, infection, bleeding, pain, swelling, need for further surgery,  delayed or nonhealing, painful or ugly scar, numbness or sensation changes, over/under correction, recurrence, transfer lesions, further deformity, hardware failure, DVT/PE, loss of toe/foot. Patient understands these risks and wishes to proceed with surgery. The surgical consent was reviewed with the patient all 3 pages were signed. No promises or guarantees were given to the outcome of the procedure. All questions were answered to the best of my ability. Before the surgery the patient was encouraged to call the office if there is any further questions. The surgery will be performed at the [facility] on an outpatient basis. - Wound culture obtained.  No follow-ups on file.  Charity Conch DPM    -Patient encouraged to call the office with any questions, concerns, change in symptoms.

## 2023-12-05 ENCOUNTER — Telehealth: Payer: Self-pay | Admitting: Nutrition

## 2023-12-05 ENCOUNTER — Ambulatory Visit: Admitting: Nutrition

## 2023-12-05 NOTE — Telephone Encounter (Signed)
 LVM to call me to let me know if she was able to get the blood sugar readings from her phone linked to her PDM.

## 2023-12-06 ENCOUNTER — Other Ambulatory Visit: Payer: Self-pay | Admitting: Podiatry

## 2023-12-06 ENCOUNTER — Telehealth: Payer: Self-pay

## 2023-12-06 DIAGNOSIS — M86172 Other acute osteomyelitis, left ankle and foot: Secondary | ICD-10-CM | POA: Diagnosis not present

## 2023-12-06 MED ORDER — HYDROCODONE-ACETAMINOPHEN 5-325 MG PO TABS: 1.0000 | ORAL_TABLET | Freq: Four times a day (QID) | ORAL | 0 refills | Status: DC | PRN

## 2023-12-06 MED ORDER — CIPROFLOXACIN HCL 500 MG PO TABS: 500.0000 mg | ORAL_TABLET | Freq: Two times a day (BID) | ORAL | 0 refills | Status: DC

## 2023-12-06 NOTE — Telephone Encounter (Signed)
 Please recollect urine drug test on 12/08/2023.

## 2023-12-06 NOTE — Telephone Encounter (Signed)
 Notation made on pending appointment. Outgoing call placed to patient, no answer, and no voicemail. Recording stated call could not be completed at this time

## 2023-12-06 NOTE — Progress Notes (Signed)
Postop medications sent 

## 2023-12-06 NOTE — Telephone Encounter (Signed)
 Beth, [facility] lab tech informed me that patients urine drug screen was not processed due to quantity (meaning they did not receive enough urine). Please advise if USD to be completed at next visit (12/08/23)or if patient needs to come back in now for recollection.

## 2023-12-07 ENCOUNTER — Ambulatory Visit: Payer: Self-pay

## 2023-12-07 ENCOUNTER — Ambulatory Visit: Payer: Self-pay | Admitting: Podiatry

## 2023-12-07 NOTE — Telephone Encounter (Signed)
 FYI Only or Action Required?: FYI only for provider  Patient was last seen in primary care on 11/30/2023 by Ngetich, Elijio Guadeloupe, NP. Called Nurse Triage reporting Allergic Reaction. Symptoms began yesterday. Interventions attempted: OTC medications: Benadryl  and Rest, hydration, or home remedies. Symptoms are: gradually improving.  Triage Disposition: See HCP Within 4 Hours (Or PCP Triage)  Patient/caregiver understands and will follow disposition?: Yes, but will wait  Copied from CRM [id number]. Topic: Clinical - Red Word Triage >> Dec 07, 2023 11:56 AM Danelle Dunning F wrote: [facility] that prompted transfer to Nurse Triage:   Allergic reaction to HYDROcodone -acetaminophen  (NORCO/VICODIN) 5-325 MG tablet ; Patient is itchy and breaking out in hives; Patient has taken benadryl  but it is not helping Reason for Disposition  [1] Taking new prescription medication AND [2] rash within 4 hours of 1st dose  Answer Assessment - Initial Assessment Questions 1. APPEARANCE of RASH: Describe the rash. (e.g., spots, blisters, raised areas, skin peeling, scaly)     Red, raised.  2. SIZE: How big are the spots? (e.g., tip of pen, eraser, coin; inches, centimeters)     Varies  3. LOCATION: Where is the rash located?     Arm, head, neck 4. COLOR: What color is the rash? (Note: It is difficult to assess rash color in people with darker-colored skin. When this situation occurs, simply ask the caller to describe what they see.)     Red 5. ONSET: When did the rash begin?     Last evening 12/06/23, 4 hours post Norco 6. FEVER: Do you have a fever? If Yes, ask: What is your temperature, how was it measured, and when did it start?     Denies 7. ITCHING: Does the rash itch? If Yes, ask: How bad is the itch? (Scale 1-10; or mild, moderate, severe)     severe 8. CAUSE: What do you think is causing the rash?     medication 9. NEW MEDICINES: What new medicines are you taking? (e.g., name of antibiotic)  When did you start taking this medication?.     Oxycodone  10. OTHER SYMPTOMS: Do you have any other symptoms? (e.g., sore throat, fever, joint pain)       Denies  Additional info: Toe surgical amputated 12/06/23. Used Benadryl  with effect. Declines acute visit today, has appointment previously scheduled with pcp on 12/08/23. Patient plans to stop Norco, continue with Benadryl  until seen by PCP on 12/08/23. Please follow up call with any additional recommendations.  Protocols used: Rash - Widespread On Drugs-A-AH

## 2023-12-08 ENCOUNTER — Ambulatory Visit: Admitting: Family

## 2023-12-11 ENCOUNTER — Ambulatory Visit (INDEPENDENT_AMBULATORY_CARE_PROVIDER_SITE_OTHER)

## 2023-12-11 ENCOUNTER — Ambulatory Visit (INDEPENDENT_AMBULATORY_CARE_PROVIDER_SITE_OTHER): Admitting: Podiatry

## 2023-12-11 DIAGNOSIS — S98132A Complete traumatic amputation of one left lesser toe, initial encounter: Secondary | ICD-10-CM | POA: Diagnosis not present

## 2023-12-13 NOTE — Progress Notes (Signed)
 Subjective: Chief Complaint  Patient presents with   Routine Post Op    RM#12POV # 1 DOS 12/06/23 LT 2ND TOE AMPUTATION-Patient states doing well.incision showing no signs of infection and pain is controlled with medication.   63 year old female presents the office today status post left second toe potation.  States that she has been doing well and no significant pain.  She has not taken the antibiotics.  She does not report any fevers or chills.  Objective: AAO x3, NAD DP/PT pulses palpable bilaterally, CRT less than 3 seconds Incision well coapted with sutures intact.  There is no surrounding erythema, ascending cellulitis but there is no drainage or pus.  No fluctuation or crepitation.  No malodor. No pain with calf compression, swelling, warmth, erythema   Assessment: Status post left second toe amputation  Plan: -All treatment options discussed with the patient including all alternatives, risks, complications.  -X-rays obtained reviewed.  Multiple views obtained.  Status post amputation without any evidence of complicating factors. -Xeroform applied to the incision followed by dry sterile dressing.  She will keep the dressing clean, dry, intact for now. -Remain in surgical shoe.  Elevation.  Finish course of antibiotics. -Monitor for any clinical signs or symptoms of infection and directed to call the office immediately should any occur or go to the ER. -Patient encouraged to call the office with any questions, concerns, change in symptoms.    Charity Conch DPM

## 2023-12-20 ENCOUNTER — Encounter: Payer: Self-pay | Admitting: Sports Medicine

## 2023-12-20 ENCOUNTER — Ambulatory Visit (INDEPENDENT_AMBULATORY_CARE_PROVIDER_SITE_OTHER): Admitting: Sports Medicine

## 2023-12-20 ENCOUNTER — Telehealth: Payer: Self-pay

## 2023-12-20 VITALS — BP 122/78 | HR 96 | Temp 97.5°F | Ht 66.0 in | Wt 245.6 lb

## 2023-12-20 DIAGNOSIS — E1165 Type 2 diabetes mellitus with hyperglycemia: Secondary | ICD-10-CM | POA: Diagnosis not present

## 2023-12-20 DIAGNOSIS — F5101 Primary insomnia: Secondary | ICD-10-CM

## 2023-12-20 DIAGNOSIS — J302 Other seasonal allergic rhinitis: Secondary | ICD-10-CM

## 2023-12-20 DIAGNOSIS — G8929 Other chronic pain: Secondary | ICD-10-CM | POA: Diagnosis not present

## 2023-12-20 DIAGNOSIS — I10 Essential (primary) hypertension: Secondary | ICD-10-CM | POA: Diagnosis not present

## 2023-12-20 DIAGNOSIS — I25119 Atherosclerotic heart disease of native coronary artery with unspecified angina pectoris: Secondary | ICD-10-CM | POA: Diagnosis not present

## 2023-12-20 DIAGNOSIS — M25561 Pain in right knee: Secondary | ICD-10-CM

## 2023-12-20 DIAGNOSIS — M25562 Pain in left knee: Secondary | ICD-10-CM

## 2023-12-20 MED ORDER — CYCLOBENZAPRINE HCL 10 MG PO TABS: 10.0000 mg | ORAL_TABLET | Freq: Two times a day (BID) | ORAL | 1 refills | Status: AC | PRN

## 2023-12-20 MED ORDER — AMMONIUM LACTATE 12 % EX CREA: 1.0000 | TOPICAL_CREAM | CUTANEOUS | 2 refills | Status: AC | PRN

## 2023-12-20 MED ORDER — DICLOFENAC SODIUM 1 % EX GEL: 2.0000 g | Freq: Four times a day (QID) | CUTANEOUS | 2 refills | Status: AC

## 2023-12-20 MED ORDER — MONTELUKAST SODIUM 10 MG PO TABS: 10.0000 mg | ORAL_TABLET | Freq: Every evening | ORAL | 1 refills | Status: AC | PRN

## 2023-12-20 MED ORDER — AMITRIPTYLINE HCL 25 MG PO TABS: 25.0000 mg | ORAL_TABLET | Freq: Every day | ORAL | 1 refills | Status: AC

## 2023-12-20 MED ORDER — ISOSORBIDE MONONITRATE ER 60 MG PO TB24: 60.0000 mg | ORAL_TABLET | Freq: Every day | ORAL | 1 refills | Status: AC

## 2023-12-20 NOTE — Patient Instructions (Addendum)
 Take tylenol  1000 mg three times daily Use volatren gel 3-4 times daily  Try physical therapy Use volatren gel 3-4 times daily Use heating pads and lidocaine  patches Referral placed to ortho

## 2023-12-20 NOTE — Progress Notes (Signed)
 Careteam: Patient Care Team: Ngetich, Roxan BROCKS, NP as PCP - General (Family Medicine) Wonda Sharper, MD as PCP - Cardiology (Cardiology) Ziglar, Christopher DASEN, MD as Consulting Physician (Podiatry) Joshua Alm Hamilton, MD as Consulting Physician (Neurosurgery) Jarold Mayo, MD as Consulting Physician (Ophthalmology) Octavia Charleston, MD as Consulting Physician (Ophthalmology) Lelon Hamilton DASEN DEVONNA as Physician Assistant (Cardiology)  PLACE OF SERVICE:  [facility]  Advanced Directive information    No Known Allergies  Chief Complaint  Patient presents with   Acute Visit    Pain in left knee.  Patient had surgery regarding amputation is what they told her pt had took medication that they gave her for 10 days from Dr. Alona      Discussed the use of AI scribe software for clinical note transcription with the patient, who gave verbal consent to proceed.  History of Present Illness    BISMA KLETT is a 63 year old female with osteomyelitis and diabetes who presents for follow-up after left toe amputation and ongoing knee pain.  She underwent surgery for osteomyelitis of the left toe, resulting in an amputation. She has been following up with a podiatrist who performs dressing changes during her visits. The stitches have not yet been removed. Her last appointment was on December 11, 2023, and she has a follow-up scheduled for this Friday.  She experiences bilateral knee pain due to cartilage loss, with both knees bothering her. Her A1c was previously 10 and needs to be reduced to about 7 before undergoing knee surgery. She uses a walker due to her knees giving out, which has led to frequent falls.  She tried gel injections for her knees about a year or two ago, which provided some relief but were not effective long-term. She has not engaged in physical therapy for her knees.  She experiences knee pain that comes and goes, with a burning sensation at times. The pain is localized to the  knees and does not radiate. She was told she has arthritis in her knees. She has not had recent knee x-rays. She occasionally takes Tylenol  for pain but finds it ineffective. She avoids ibuprofen  due to heart concerns.  She has a history of diabetes and uses an Omnipod and Ozempic  for management. Her last A1c was 8.7, down from 11. She monitors her blood sugar with a sensor and has an upcoming endocrinology appointment on February 07, 2024. She also takes Jardiance .  She has a history of a heart attack in 2018 and is on multiple blood pressure medications. She uses a nitroglycerin  spray as needed.    She has a history of spinal surgery with residual neck stiffness and limited mobility due to screws in her neck. She takes muscle relaxants and oxycodone  5/325 mg for pain management.    Review of Systems:  Review of Systems  Constitutional:  Negative for chills and fever.  HENT:  Negative for congestion and sore throat.   Eyes:  Negative for double vision.  Respiratory:  Negative for cough, sputum production and shortness of breath.   Cardiovascular:  Negative for chest pain, palpitations and leg swelling.  Gastrointestinal:  Negative for abdominal pain, heartburn and nausea.  Genitourinary:  Negative for dysuria, frequency and hematuria.  Musculoskeletal:  Positive for joint pain. Negative for falls and myalgias.  Neurological:  Negative for dizziness.   Negative unless indicated in HPI.   Past Medical History:  Diagnosis Date   Adhesive capsulitis of shoulder    bilateral, Steroid injection  Dr. Jama 1/12 bilaterally   CAD (coronary artery disease)    nonobstructive. Last cardiac cath (2008) showing left circumflex with mid 50% stenosis and distal luminal irregularities. Also with RCA with mid to distal 30-40% stenosis. // Previously evaluated by [facility], never followed up outpatient.   CAP (community acquired pneumonia) 06/15/2012   05/2012 CXR: Mild opacification of the posterior  lung base on the lateral film  as cannot exclude infection/atelectasis.     CHF (congestive heart failure) (HCC)    Diabetes mellitus 2007   Type II, insulin  dependent   Diabetic retinopathy    GERD (gastroesophageal reflux disease)    Glaucoma    Hyperlipidemia    Hypertension    Obesity    Palpitations    Monitor 10/2023: NSR, average heart rate 87, PVCs/PACs <1%, no sustained arrhythmias/A-fib/high-grade heart block   Peripheral neuropathy    Past Surgical History:  Procedure Laterality Date   AMPUTATION TOE Left 03/2023   3rd left toe   AMPUTATION TOE Left 07/21/2023   Procedure: AMPUTATION LEFT GREAT TOE;  Surgeon: Malvin Marsa FALCON, DPM;  Location: [facility] OR;  Service: Orthopedics/Podiatry;  Laterality: Left;   ANTERIOR CERVICAL DECOMP/DISCECTOMY FUSION N/A 06/16/2016   Procedure: Anterior Cervical Decompression/discectomy Fusion - Cervical six - Cervical seven;  Surgeon: Alm GORMAN Molt, MD;  Location: [facility] OR;  Service: Neurosurgery;  Laterality: N/A;  Anterior Cervical Decompression/discectomy Fusion - Cervical six - Cervical seven   APPENDECTOMY     CARDIAC CATHETERIZATION     CATARACT EXTRACTION     GLAUCOMA SURGERY     LEFT HEART CATH AND CORONARY ANGIOGRAPHY N/A 05/22/2018   Procedure: LEFT HEART CATH AND CORONARY ANGIOGRAPHY;  Surgeon: Burnard Debby LABOR, MD;  Location: [facility] INVASIVE CV LAB;  Service: Cardiovascular;  Laterality: N/A;   LEFT HEART CATHETERIZATION WITH CORONARY ANGIOGRAM N/A 01/24/2012   Procedure: LEFT HEART CATHETERIZATION WITH CORONARY ANGIOGRAM;  Surgeon: Ozell Fell, MD;  Location[name] CATH LAB;  Service: Cardiovascular;  Laterality: N/A;   POSTERIOR CERVICAL FUSION/FORAMINOTOMY N/A 10/22/2015   Procedure: Cervical three-Cervical Seven Posterior cervical fusion with lateral mass fixation,  Laminectomy Cervical Three-Cervical Seven;  Surgeon: Alm GORMAN Molt, MD;  Location: [facility] NEURO ORS;  Service: Neurosurgery;  Laterality: N/A;  Posterior   TUBAL LIGATION      Social History:   reports that she quit smoking about 17 years ago. Her smoking use included cigarettes. She started smoking about 24 years ago. She has a 2.1 pack-year smoking history. She has never used smokeless tobacco. She reports that she does not drink alcohol and does not use drugs.  Family History  Problem Relation Age of Onset   Heart disease Mother    Hypertension Sister    Hypertension Sister    Hypertension Sister    Heart attack Neg Hx    Stroke Neg Hx     Medications: Patient's Medications  New Prescriptions   No medications on file  Previous Medications   ALCOHOL SWABS (B-D SINGLE USE SWABS REGULAR) PADS    Use as directed   AMITRIPTYLINE  (ELAVIL ) 25 MG TABLET    Take 1 tablet (25 mg total) by mouth at bedtime.   AMMONIUM LACTATE  (AMLACTIN) 12 % CREAM    Apply 1 Application topically as needed for dry skin.   CIPROFLOXACIN  (CIPRO ) 500 MG TABLET    Take 1 tablet (500 mg total) by mouth 2 (two) times daily.   CLOPIDOGREL  (PLAVIX ) 75 MG TABLET    Take 1 tablet (75 mg  total) by mouth daily.   COMBIGAN 0.2-0.5 % OPHTHALMIC SOLUTION    Place 1 drop into both eyes 2 (two) times daily.   CONTINUOUS BLOOD GLUC RECEIVER (DEXCOM G7 RECEIVER) DEVI    Use to check blood sugars   CONTINUOUS GLUCOSE SENSOR (DEXCOM G7 SENSOR) MISC    Change sensor every 10 days   CYCLOBENZAPRINE  (FLEXERIL ) 10 MG TABLET    Take 10 mg by mouth 2 (two) times daily as needed.   DIPHENHYDRAMINE  (BENADRYL ) 25 MG TABLET    Take 1 tablet (25 mg total) by mouth every 6 (six) hours as needed for itching or allergies.   DORZOLAMIDE  (TRUSOPT ) 2 % OPHTHALMIC SOLUTION    Place 1 drop into both eyes 2 (two) times daily.   EMPAGLIFLOZIN  (JARDIANCE ) 25 MG TABS TABLET    Take 1 tablet (25 mg total) by mouth daily.   FUROSEMIDE  (LASIX ) 20 MG TABLET    Take 20 mg by mouth 2 (two) times daily.   GUAIFENESIN  (MUCINEX  MAXIMUM STRENGTH) 1200 MG TB12    Take 1 tablet (1,200 mg total) by mouth every 12 (twelve) hours as  needed.   HYDROCODONE -ACETAMINOPHEN  (NORCO/VICODIN) 5-325 MG TABLET    Take 1-2 tablets by mouth every 6 (six) hours as needed.   INSULIN  DISPOSABLE PUMP (OMNIPOD 5 G7 INTRO, GEN 5,) KIT    1 Device by Does not apply route every other day.   INSULIN  DISPOSABLE PUMP (OMNIPOD 5 G7 PODS, GEN 5,) MISC    1 Device by Does not apply route every other day.   INSULIN  LISPRO (HUMALOG ) 100 UNIT/ML INJECTION    Max daily 60 units   INSULIN  PEN NEEDLE 31G X 5 MM MISC    1 Device by Does not apply route in the morning, at noon, in the evening, and at bedtime.   ISOSORBIDE  MONONITRATE (IMDUR ) 60 MG 24 HR TABLET    Take 60 mg by mouth daily.   LISINOPRIL  (ZESTRIL ) 2.5 MG TABLET    Take 1 tablet (2.5 mg total) by mouth daily.   METOPROLOL  TARTRATE (LOPRESSOR ) 25 MG TABLET    TAKE 2 TABLETS BY MOUTH IN THE AM, TAKE 1 TABLET BY MOUTH IN THE EVENING   MONTELUKAST (SINGULAIR) 10 MG TABLET    Take 10 mg by mouth at bedtime as needed.   NETARSUDIL -LATANOPROST  (ROCKLATAN ) 0.02-0.005 % SOLN    Place 1 drop into both eyes at bedtime.   NITROGLYCERIN  (NITROSTAT ) 0.4 MG SL TABLET    DISSOLVE 1 TABLET (0.4 MG TOTAL) UNDER THE TONGUE EVERY 5 MINUTES AS NEEDED FOR CHEST PAIN.   ONETOUCH DELICA LANCETS FINE MISC    Check blood sugar as instructed up to 3 times a day   PANTOPRAZOLE  (PROTONIX ) 40 MG TABLET    Take 1 tablet (40 mg total) by mouth daily.   PREGABALIN  (LYRICA ) 75 MG CAPSULE    Take 1 capsule (75 mg total) by mouth 2 (two) times daily.   RANOLAZINE  (RANEXA ) 500 MG 12 HR TABLET    Take 1 tablet (500 mg total) by mouth 2 (two) times daily.   ROSUVASTATIN  (CRESTOR ) 40 MG TABLET    TAKE 1 TABLET(40 MG) BY MOUTH DAILY   SEMAGLUTIDE , 2 MG/DOSE, (OZEMPIC , 2 MG/DOSE,) 8 MG/3ML SOPN    INJECT 2 MG INTO SKIN ONCE A WEEK   TRUEPLUS LANCETS 33G MISC    Check 4 times a daily  Modified Medications   No medications on file  Discontinued Medications   No medications on file  Physical Exam: Vitals:   12/20/23 0819  BP:  122/78  Pulse: 96  Temp: (!) 97.5 F (36.4 C)  SpO2: 98%  Weight: 245 lb 9.6 oz (111.4 kg)  Height: 5' 6 (1.676 m)   Body mass index is 39.64 kg/m. BP Readings from Last 3 Encounters:  12/20/23 122/78  11/30/23 126/78  10/18/23 130/70   Wt Readings from Last 3 Encounters:  12/20/23 245 lb 9.6 oz (111.4 kg)  11/30/23 241 lb 3.2 oz (109.4 kg)  10/18/23 243 lb 6.4 oz (110.4 kg)    Physical Exam Constitutional:      Appearance: Normal appearance.  HENT:     Head: Normocephalic and atraumatic.   Cardiovascular:     Rate and Rhythm: Normal rate and regular rhythm.  Pulmonary:     Effort: Pulmonary effort is normal. No respiratory distress.     Breath sounds: Normal breath sounds. No wheezing.  Abdominal:     General: Bowel sounds are normal. There is no distension.     Tenderness: There is no abdominal tenderness. There is no guarding or rebound.     Comments:     Musculoskeletal:        General: No swelling.     Comments: Bil knee - no warmth, no redness, no swelling Joint line tenderness + Rom intact     Neurological:     Mental Status: She is alert. Mental status is at baseline.     Motor: No weakness.     Labs reviewed: Basic Metabolic Panel: Recent Labs    07/21/23 0717 07/22/23 0748 11/30/23 1128  NA 136 138 138  K 4.0 3.5 4.3  CL 106 104 103  CO2 18* 22 25  GLUCOSE 172* 171* 139*  BUN 17 15 30*  CREATININE 1.17* 1.03* 1.28*  CALCIUM  8.9 9.2 9.6  MG 2.0  --   --   PHOS 3.1  --   --   TSH  --   --  1.96   Liver Function Tests: Recent Labs    11/30/23 1128  AST 13  ALT 13  BILITOT 0.4  PROT 7.5   No results for input(s): LIPASE, AMYLASE in the last 8760 hours. No results for input(s): AMMONIA in the last 8760 hours. CBC: Recent Labs    07/20/23 1820 07/20/23 1956 07/21/23 0817 07/22/23 0748 11/30/23 1128  WBC 12.7*  --  11.6* 10.9* 11.1*  NEUTROABS 8.0*  --   --   --  6,460  HGB 12.2   < > 11.4* 11.9* 12.5  HCT 39.9   < >  35.8* 37.5 40.2  MCV 89.7  --  87.5 87.4 87.6  PLT 247  --  245 215 244   < > = values in this interval not displayed.   Lipid Panel: No results for input(s): CHOL, HDL, LDLCALC, TRIG, CHOLHDL, LDLDIRECT in the last 8760 hours. TSH: Recent Labs    11/30/23 1128  TSH 1.96   A1C: Lab Results  Component Value Date   HGBA1C 8.7 (A) 10/10/2023    Assessment and Plan Assessment & Plan   1. Chronic pain of both knees (Primary) No redness , no swelling ,  Joint line tenderness+ Will order x ray knee Will refer to physical therapy  Will refer to ortho  - DG Knee Complete 4 Views Left; Future - DG Knee Complete 4 Views Right; Future - AMB referral to orthopedics - Ambulatory referral to Physical Therapy - diclofenac  Sodium (VOLTAREN ) 1 % GEL; Apply  2 g topically 4 (four) times daily.  Dispense: 100 g; Refill: 2  2. Essential hypertension At goal  Cont with the same  3. Uncontrolled type 2 diabetes mellitus with hyperglycemia (HCC) Avoid high carbohydrate foods Exercise regularly Follow up with endo  4. Coronary artery disease involving native coronary artery of native heart with angina pectoris (HCC) Denies chest pain Refilled imdur  - isosorbide  mononitrate (IMDUR ) 60 MG 24 hr tablet; Take 1 tablet (60 mg total) by mouth daily.  Dispense: 90 tablet; Refill: 1  5. Primary insomnia Discussed regarding sleep hygiene Refilled elavil  - amitriptyline  (ELAVIL ) 25 MG tablet; Take 1 tablet (25 mg total) by mouth at bedtime.  Dispense: 90 tablet; Refill: 1  6. Seasonal allergies Denies cough, sore throat  - montelukast (SINGULAIR) 10 MG tablet; Take 1 tablet (10 mg total) by mouth at bedtime as needed.  Dispense: 90 tablet; Refill: 1  Other orders - ammonium lactate  (AMLACTIN) 12 % cream; Apply 1 Application topically as needed for dry skin.  Dispense: 385 g; Refill: 2 - cyclobenzaprine  (FLEXERIL ) 10 MG tablet; Take 1 tablet (10 mg total) by mouth 2 (two) times  daily as needed.  Dispense: 30 tablet; Refill: 1

## 2023-12-20 NOTE — Telephone Encounter (Signed)
 Copied from CRM [id number]. Topic: Clinical - Prescription Issue >> Dec 20, 2023  1:58 PM Zane F wrote: Reason for CRM:   Patient contacted the office stated that the HYDROcodone -acetaminophen  (NORCO/VICODIN) 5-325 MG tablet while prescribed by a provider named Dr. Donnice Fees, patient stated that she is under contract to only receive pain management from her PCP's office. Please review prescription and contact patient with possible next steps.   Patient was informed to attempt to reach out to the provider's office for possible assistance with the refill.   Callback Number: [phone]

## 2023-12-20 NOTE — Telephone Encounter (Signed)
 She was seen today by Dr. Sherlynn and she will need to address medication concerns for patient care.

## 2023-12-20 NOTE — Telephone Encounter (Signed)
 Message routed to Dr.Veludandi who patient was seen by today, and Medical assistant assigned to provider [place].G/CMA

## 2023-12-20 NOTE — Telephone Encounter (Signed)
 Patient was seen today , Dr. LULLA stated she wasn't refilling pts hydrocodone  due to it being a controlled Substance as well as [facility] being her PCP. Dr. LULLA stated if patient called back requesting medication to send to [facility] in basket and whomever is covering to make the decision with refilling medication.

## 2023-12-20 NOTE — Telephone Encounter (Signed)
 Message routed back to Dr.Veludandi

## 2023-12-21 ENCOUNTER — Encounter: Admitting: Podiatry

## 2023-12-21 NOTE — Telephone Encounter (Signed)
 I have called and spoken to patient. I notified her that Dr.Veludandi will not be refilling norco for her. Patient is scheduled to see Dr.Wagoner tomorrow 12/22/2023. I advised her to speak to him about pain medication and to ask if he could refill for her until patient is scheduled to see Roxan Plough, NP in office 01/01/2024. Patient was agreeable and thanked me for trying to help. Message routed to Dr.Veludandi and Monina Vargas, NP who is covering for Roxan Plough, NP.

## 2023-12-21 NOTE — Telephone Encounter (Signed)
 Sherlynn Madden, MD      12/21/23  9:40 AM Pt did pain contract with [facility], I would not be filling her norco , its should be done by PCP or who ever is covering, thanks.

## 2023-12-22 ENCOUNTER — Ambulatory Visit (INDEPENDENT_AMBULATORY_CARE_PROVIDER_SITE_OTHER): Admitting: Podiatry

## 2023-12-22 ENCOUNTER — Other Ambulatory Visit: Payer: Self-pay | Admitting: Podiatry

## 2023-12-22 ENCOUNTER — Ambulatory Visit: Payer: Self-pay | Admitting: Podiatry

## 2023-12-22 DIAGNOSIS — S98132A Complete traumatic amputation of one left lesser toe, initial encounter: Secondary | ICD-10-CM

## 2023-12-22 MED ORDER — HYDROCODONE-ACETAMINOPHEN 5-325 MG PO TABS: 1.0000 | ORAL_TABLET | Freq: Four times a day (QID) | ORAL | 0 refills | Status: DC | PRN

## 2023-12-23 NOTE — Progress Notes (Signed)
 Subjective: Chief Complaint  Patient presents with   Routine Post Op    RM#12 POV#2 patient states having pain incision area showing no signs of infection is in a good healing state.    63 year old female presents the office today status post left second toe potation.  She presents today for suture removal.  She has been having some pain to the area.  She does not report any fevers or chills.  She still walking the surgical shoe.  No injuries.  No other concerns.   Objective: AAO x3, NAD DP/PT pulses palpable bilaterally, CRT less than 3 seconds Incision well coapted with sutures intact.  There is trace edema present.  There is no surrounding erythema, ascending cellulitis.  There is no fluctuation or crepitation.  I am not able to appreciate any significant pain on exam today but she do describes intermittent discomfort.  No ulcerations noted.  No pain with calf compression, swelling, warmth, erythema   Assessment: Status post left second toe amputation  Plan: -All treatment options discussed with the patient including all alternatives, risks, complications.  -Reviewed the sutures today without complications.  Incision appears to be healing well.  Steri-Strips applied for reinforcement.  Dressing reapplied.  Discussed that she can wash it with soap and water, dry thoroughly apply a similar bandage. -Wearing a surgical shoe into the scar is well-formed and she can gradually transition back to regular shoe.  Continue to elevate. -Vicodin prescribed prn -Monitor for any clinical signs or symptoms of infection and directed to call the office immediately should any occur or go to the ER.  Return in about 3 weeks (around 01/12/2024).  Donnice JONELLE Fees DPM

## 2023-12-25 ENCOUNTER — Telehealth: Payer: Self-pay | Admitting: Gastroenterology

## 2023-12-25 NOTE — Telephone Encounter (Signed)
 Dr. San,  We received a referral for a colonoscopy.  Patient last saw Dr. Rollin in 2018. (Records in [name]). Patient is requesting to switch to [facility].  Please review and advise scheduling?  Thanks [employer]  DOD 6/30/25PM

## 2024-01-01 ENCOUNTER — Ambulatory Visit: Admitting: Family

## 2024-01-02 ENCOUNTER — Ambulatory Visit: Admitting: Adult Health

## 2024-01-05 ENCOUNTER — Encounter: Payer: Self-pay | Admitting: Gastroenterology

## 2024-01-08 ENCOUNTER — Ambulatory Visit (INDEPENDENT_AMBULATORY_CARE_PROVIDER_SITE_OTHER): Admitting: Podiatry

## 2024-01-08 VITALS — Ht 66.0 in | Wt 245.6 lb

## 2024-01-08 DIAGNOSIS — L84 Corns and callosities: Secondary | ICD-10-CM | POA: Diagnosis not present

## 2024-01-08 DIAGNOSIS — L97511 Non-pressure chronic ulcer of other part of right foot limited to breakdown of skin: Secondary | ICD-10-CM

## 2024-01-08 MED ORDER — DOXYCYCLINE HYCLATE 100 MG PO TABS: 100.0000 mg | ORAL_TABLET | Freq: Two times a day (BID) | ORAL | 0 refills | Status: DC

## 2024-01-08 MED ORDER — PREGABALIN 75 MG PO CAPS: 75.0000 mg | ORAL_CAPSULE | Freq: Two times a day (BID) | ORAL | 2 refills | Status: DC

## 2024-01-11 NOTE — Progress Notes (Signed)
 Subjective: Chief Complaint  Patient presents with   Post-op Follow-up    RM 12 Post-op follow-up visit #3 for left 2nd toe amputation. Site is scabbed over, no swelling or redness with no signs of infection. Patient is concerned about swelling in the 3rd left toe. Right 2nd toe has darkened.    63 year old female presents the office today status post left second toe potation.  States that she had been doing well from the standpoint.  Has not seen any drainage or pus.  She has new concerns of her right second toe becoming darker at the tip.  Does not see any open sores or any drainage.  No recent injuries that she reports.  Objective: AAO x3, NAD DP/PT pulses palpable bilaterally, CRT less than 3 seconds Incision well coapted with a few sutures still intact.  There is scabbing present along the incision but there is no open lesion identified there is no surrounding erythema, ascending cellulitis.  There is no drainage or pus noted today.  No fluctuation or crepitation.  On the right second toe there is a thick hyperkeratotic lesion noted at the distal aspect of the toe without any underlying ulceration, drainage or signs of infection today.  There is discoloration of the distal portion of the toe which I think is more from inflammation. No pain with calf compression, swelling, warmth, erythema  Assessment: Status post left second toe amputation; right second toe preulcerative lesion  Plan: -All treatment options discussed with the patient including all alternatives, risks, complications.  -The remainder of the sutures there were any complications.  Incision appears to be healing well.  Small mild scabbing still present.  Antibiotic ointment was applied followed by dressing.  Continue with daily dressing changes, offloading.  Remain in surgical shoe for now until the scab off and she can start to gradually transition to a regular shoe. -In regards to the right second toe eschar.   Hyperkeratotic lesion and complications of bleeding.  Given the discoloration I think this more point formation but we will start antibiotics in case of early infection.  Prescribed doxycycline . -Monitor for any clinical signs or symptoms of infection and directed to call the office immediately should any occur or go to the ER.  Return in about 2 weeks (around 01/22/2024).  [name] DPM

## 2024-01-23 ENCOUNTER — Ambulatory Visit: Attending: Sports Medicine | Admitting: Physical Therapy

## 2024-01-23 NOTE — Therapy (Incomplete)
 OUTPATIENT PHYSICAL THERAPY LOWER EXTREMITY EVALUATION   Patient Name: [name] MRN: [id number] DOB:30-Sep-1960, 63 y.o., female Today's Date: 01/23/2024  END OF SESSION:   Past Medical History:  Diagnosis Date   Adhesive capsulitis of shoulder    bilateral, Steroid injection Dr. Jama 1/12 bilaterally   CAD (coronary artery disease)    nonobstructive. Last cardiac cath (2008) showing left circumflex with mid 50% stenosis and distal luminal irregularities. Also with RCA with mid to distal 30-40% stenosis. // Previously evaluated by [facility], never followed up outpatient.   CAP (community acquired pneumonia) 06/15/2012   05/2012 CXR: Mild opacification of the posterior lung base on the lateral film  as cannot exclude infection/atelectasis.     CHF (congestive heart failure) (HCC)    Diabetes mellitus 2007   Type II, insulin  dependent   Diabetic retinopathy    GERD (gastroesophageal reflux disease)    Glaucoma    Hyperlipidemia    Hypertension    Obesity    Palpitations    Monitor 10/2023: NSR, average heart rate 87, PVCs/PACs <1%, no sustained arrhythmias/A-fib/high-grade heart block   Peripheral neuropathy    Past Surgical History:  Procedure Laterality Date   AMPUTATION TOE Left 03/2023   3rd left toe   AMPUTATION TOE Left 07/21/2023   Procedure: AMPUTATION LEFT GREAT TOE;  Surgeon: Malvin Marsa FALCON, DPM;  Location: [facility] OR;  Service: Orthopedics/Podiatry;  Laterality: Left;   ANTERIOR CERVICAL DECOMP/DISCECTOMY FUSION N/A 06/16/2016   Procedure: Anterior Cervical Decompression/discectomy Fusion - Cervical six - Cervical seven;  Surgeon: Alm GORMAN Molt, MD;  Location: [facility] OR;  Service: Neurosurgery;  Laterality: N/A;  Anterior Cervical Decompression/discectomy Fusion - Cervical six - Cervical seven   APPENDECTOMY     CARDIAC CATHETERIZATION     CATARACT EXTRACTION     GLAUCOMA SURGERY     LEFT HEART CATH AND CORONARY ANGIOGRAPHY N/A 05/22/2018   Procedure:  LEFT HEART CATH AND CORONARY ANGIOGRAPHY;  Surgeon: Burnard Debby LABOR, MD;  Location: [facility] INVASIVE CV LAB;  Service: Cardiovascular;  Laterality: N/A;   LEFT HEART CATHETERIZATION WITH CORONARY ANGIOGRAM N/A 01/24/2012   Procedure: LEFT HEART CATHETERIZATION WITH CORONARY ANGIOGRAM;  Surgeon: Ozell Fell, MD;  Location: [facility] CATH LAB;  Service: Cardiovascular;  Laterality: N/A;   POSTERIOR CERVICAL FUSION/FORAMINOTOMY N/A 10/22/2015   Procedure: Cervical three-Cervical Seven Posterior cervical fusion with lateral mass fixation,  Laminectomy Cervical Three-Cervical Seven;  Surgeon: Alm GORMAN Molt, MD;  Location: [facility] NEURO ORS;  Service: Neurosurgery;  Laterality: N/A;  Posterior   TUBAL LIGATION     Patient Active Problem List   Diagnosis Date Noted   Morbid (severe) obesity due to excess calories (HCC) 11/30/2023   S/P amputation of lesser toe, left (HCC) - Great Toe on 07-21-2023 07/22/2023   Osteomyelitis of great toe of left foot (HCC) 07/20/2023   Amputation of left great toe (HCC) 02/24/2023   Long term current use of oral hypoglycemic drug 02/24/2023   Long-term (current) use of injectable non-insulin  antidiabetic drugs 02/24/2023   Sinus tachycardia 11/16/2021   Diabetes mellitus (HCC) 10/07/2019   Type 2 diabetes mellitus with diabetic polyneuropathy, with long-term current use of insulin  (HCC) 10/07/2019   Type 2 diabetes mellitus with retinopathy, with long-term current use of insulin  (HCC) 10/07/2019   Dyslipidemia 10/07/2019   Old MI (myocardial infarction)    Chronic lower back pain 02/21/2018   Chronic pain of right knee 01/23/2018   Lateral pain of right hip 04/27/2017   Right foot ulcer (  HCC) 10/26/2016   Constipation 09/30/2016   History of colonic polyps 09/30/2016   Right foot pain 07/08/2016   Non-alcoholic fatty liver disease 05/13/2016   Spinal stenosis in cervical region 09/29/2015   Bilateral low back pain with sciatica    Long-term current use of opiate analgesic  07/11/2014   Onychomycosis 10/29/2013   Severe nonproliferative diabetic retinopathy (HCC) 04/04/2013   Primary open angle glaucoma 04/04/2013   Insomnia 06/15/2012   HFmrEF (heart failure with mildly reduced ejection fraction) (HCC) 01/30/2012   CAD (coronary artery disease) 11/02/2011   Preventative health care 07/13/2011   Adhesive capsulitis of right shoulder 11/16/2010   Gastroesophageal reflux disease 10/12/2009   Uncontrolled type 2 diabetes mellitus with hyperglycemia (HCC) 06/12/2006   Hyperlipidemia 06/12/2006   Obesity, Class II, BMI 35-39.9 06/12/2006   Essential hypertension 06/12/2006    PCP: ***  REFERRING PROVIDER: Sherlynn Madden, MD  REFERRING DIAG: M25.561,M25.562,G89.29 (ICD-10-CM) - Chronic pain of both knees  THERAPY DIAG:  No diagnosis found.  Rationale for Evaluation and Treatment: Rehabilitation  ONSET DATE: 12/20/2023 (referral date)  SUBJECTIVE:   SUBJECTIVE STATEMENT: ***  PERTINENT HISTORY: ***  [name] is a 63 year old female with osteomyelitis and diabetes who presents for follow-up after left toe amputation and ongoing knee pain.   She underwent surgery for osteomyelitis of the left toe, resulting in an amputation. She has been following up with a podiatrist who performs dressing changes during her visits. The stitches have not yet been removed. Her last appointment was on December 11, 2023, and she has a follow-up scheduled for this Friday.   She experiences bilateral knee pain due to cartilage loss, with both knees bothering her. Her A1c was previously 10 and needs to be reduced to about 7 before undergoing knee surgery. She uses a walker due to her knees giving out, which has led to frequent falls.  She tried gel injections for her knees about a year or two ago, which provided some relief but were not effective long-term. She has not engaged in physical therapy for her knees.   She experiences knee pain that comes and goes, with  a burning sensation at times. The pain is localized to the knees and does not radiate. She was told she has arthritis in her knees. She has not had recent knee x-rays. She occasionally takes Tylenol  for pain but finds it ineffective. She avoids ibuprofen  due to heart concerns.   She has a history of diabetes and uses an Omnipod and Ozempic  for management. Her last A1c was 8.7, down from 11. She monitors her blood sugar with a sensor and has an upcoming endocrinology appointment on February 07, 2024. She also takes Jardiance .   She has a history of a heart attack in 2018 and is on multiple blood pressure medications. She uses a nitroglycerin  spray as needed.     She has a history of spinal surgery with residual neck stiffness and limited mobility due to screws in her neck. She takes muscle relaxants and oxycodone  5/325 mg for pain management.    PAIN:  Are you having pain? {OPRCPAIN:27236}  PRECAUTIONS: {Therapy precautions:24002}  RED FLAGS: {PT Red Flags:29287}   WEIGHT BEARING RESTRICTIONS: {Yes ***/No:24003}  FALLS:  Has patient fallen in last 6 months? {fallsyesno:27318}  LIVING ENVIRONMENT: Lives with: {OPRC lives with:25569::lives with their family} Lives in: {Lives in:25570} Stairs: {opstairs:27293} Has following equipment at home: {Assistive devices:23999}  OCCUPATION: ***  PLOF: {PLOF:24004}  PATIENT GOALS: ***  NEXT  MD VISIT: ***  OBJECTIVE:  Note: Objective measures were completed at Evaluation unless otherwise noted.  DIAGNOSTIC FINDINGS: ***Pending knee xrays, not scheduled yet but ordered  PATIENT SURVEYS:  {rehab surveys:24030}  COGNITION: Overall cognitive status: {cognition:24006}     SENSATION: {sensation:27233}  EDEMA:  {edema:24020}  MUSCLE LENGTH: Hamstrings: Right *** deg; Left *** deg Thomas test: Right *** deg; Left *** deg  POSTURE: {posture:25561}  PALPATION: ***  LOWER EXTREMITY ROM:  {AROM/PROM:27142} ROM Right eval  Left eval  Hip flexion    Hip extension    Hip abduction    Hip adduction    Hip internal rotation    Hip external rotation    Knee flexion    Knee extension    Ankle dorsiflexion    Ankle plantarflexion    Ankle inversion    Ankle eversion     (Blank rows = not tested)  LOWER EXTREMITY MMT:  MMT Right eval Left eval  Hip flexion    Hip extension    Hip abduction    Hip adduction    Hip internal rotation    Hip external rotation    Knee flexion    Knee extension    Ankle dorsiflexion    Ankle plantarflexion    Ankle inversion    Ankle eversion     (Blank rows = not tested)  LOWER EXTREMITY SPECIAL TESTS:  {LEspecialtests:26242}  FUNCTIONAL TESTS:  {Functional tests:24029}  GAIT: Distance walked: *** Assistive device utilized: {Assistive devices:23999} Level of assistance: {Levels of assistance:24026} Comments: ***                                                                                                                                TREATMENT DATE: *** PT Evaluation  ***   PATIENT EDUCATION:  Education details: *** Person educated: {Person educated:25204} Education method: {Education Method:25205} Education comprehension: {Education Comprehension:25206}  HOME EXERCISE PROGRAM: ***  ASSESSMENT:  CLINICAL IMPRESSION: Patient is a *** year old *** referred to Neuro OPPT for***.   Pt's PMH is significant for: *** The following deficits were present during the exam: ***. Based on ***, pt is an incr risk for falls. Pt would benefit from skilled PT to address these impairments and functional limitations to maximize functional mobility independence   OBJECTIVE IMPAIRMENTS: {opptimpairments:25111}.   ACTIVITY LIMITATIONS: {activitylimitations:27494}  PARTICIPATION LIMITATIONS: {participationrestrictions:25113}  PERSONAL FACTORS: {Personal factors:25162} are also affecting patient's functional outcome.   REHAB POTENTIAL:  {rehabpotential:25112}  CLINICAL DECISION MAKING: {clinical decision making:25114}  EVALUATION COMPLEXITY: {Evaluation complexity:25115}   GOALS: Goals reviewed with patient? {yes/no:20286}  SHORT TERM GOALS: Target date: *** *** Baseline: Goal status: INITIAL  2.  *** Baseline:  Goal status: INITIAL  3.  *** Baseline:  Goal status: INITIAL  4.  *** Baseline:  Goal status: INITIAL  5.  *** Baseline:  Goal status: INITIAL  6.  *** Baseline:  Goal status: INITIAL  LONG TERM GOALS: Target date: ***  ***  Baseline:  Goal status: INITIAL  2.  *** Baseline:  Goal status: INITIAL  3.  *** Baseline:  Goal status: INITIAL  4.  *** Baseline:  Goal status: INITIAL  5.  *** Baseline:  Goal status: INITIAL  6.  *** Baseline:  Goal status: INITIAL   PLAN:  PT FREQUENCY: {rehab frequency:25116}  PT DURATION: {rehab duration:25117}  PLANNED INTERVENTIONS: {rehab planned interventions:25118::97110-Therapeutic exercises,97530- Therapeutic [phone]- Neuromuscular re-education,97535- Self Rjmz,02859- Manual therapy}  PLAN FOR NEXT SESSION: ***   Waddell Southgate, PT Waddell Southgate, PT, DPT, CSRS  01/23/2024, 7:34 AM

## 2024-01-30 ENCOUNTER — Telehealth: Payer: Self-pay | Admitting: Podiatry

## 2024-01-30 NOTE — Telephone Encounter (Signed)
 Patient called requesting a refill for:pregabalin  (LYRICA ) 75 MG capsule   She was advised to contact her pharmacy to have them submit a refill request.

## 2024-02-01 ENCOUNTER — Ambulatory Visit: Admitting: Podiatry

## 2024-02-01 ENCOUNTER — Encounter: Payer: Self-pay | Admitting: Podiatry

## 2024-02-01 VITALS — Ht 66.0 in | Wt 245.6 lb

## 2024-02-01 DIAGNOSIS — M79674 Pain in right toe(s): Secondary | ICD-10-CM

## 2024-02-01 DIAGNOSIS — S98132A Complete traumatic amputation of one left lesser toe, initial encounter: Secondary | ICD-10-CM

## 2024-02-01 DIAGNOSIS — M869 Osteomyelitis, unspecified: Secondary | ICD-10-CM

## 2024-02-01 DIAGNOSIS — Z794 Long term (current) use of insulin: Secondary | ICD-10-CM | POA: Diagnosis not present

## 2024-02-01 DIAGNOSIS — L84 Corns and callosities: Secondary | ICD-10-CM | POA: Diagnosis not present

## 2024-02-01 DIAGNOSIS — M79675 Pain in left toe(s): Secondary | ICD-10-CM

## 2024-02-01 DIAGNOSIS — E1142 Type 2 diabetes mellitus with diabetic polyneuropathy: Secondary | ICD-10-CM

## 2024-02-01 DIAGNOSIS — B351 Tinea unguium: Secondary | ICD-10-CM | POA: Diagnosis not present

## 2024-02-01 NOTE — Progress Notes (Signed)
 Subjective: Chief Complaint  Patient presents with   Foot Ulcer    Pt is here to f/u on left foot due to ulcer, she states foot has been a little sore, thinks it has something to do with the post op shoe, no other complaints.     63 year old female presents the office today status post left second toe amputation.  States that she had been doing well from the standpoint.  Has not seen any drainage or pus.  No new ulcerations are identified at this time.  Objective: AAO x3, NAD DP/PT pulses palpable bilaterally, CRT less than 3 seconds Incision well coapted.  There is 1 small area of some scabbing noted but upon debridement of this underlying incision, wound is healed.  There is no open lesion identified bilaterally today.  There is a preulcerative lesion at the distal aspect of the right second toe without any underlying ulceration, drainage or any signs of infection.  The left fourth, fifth and the right second, fourth, fifth toenails are hypertrophic, dystrophic and elongated and they rub the adjacent toes No pain with calf compression, swelling, warmth, erythema  Assessment: Status post left second toe amputation; right second toe preulcerative lesion; symptomatic onychomycosis  Plan: Status post left toe amputation -Healing well.  Discussed that she can start to gradually transition to a regular diabetic shoe as tolerated.  A new surgical shoe was dispensed as hers is wearing out until she is able to transition back to regular shoe.  Symptomatic onychomycosis -Sharply debrided nails x 5 without any complications or bleeding  Preulcerative callus -Sharply debrided x 1 without any complications or bleeding.  Daily foot inspection.  Offloading.  Return in about 4 weeks (around 02/29/2024) for foot exam.  Donnice JONELLE Fees DPM

## 2024-02-07 ENCOUNTER — Ambulatory Visit: Admitting: Family

## 2024-02-07 ENCOUNTER — Ambulatory Visit: Admitting: Internal Medicine

## 2024-02-07 NOTE — Progress Notes (Deleted)
 Name: [name]  Age/ Sex: 63 y.o., female   MRN/ DOB: [id number], August 04, 1960     PCP: Carrie Roxan JAYSON, NP   Reason for Endocrinology Evaluation: Type 2 Diabetes Mellitus  Initial Endocrine Consultative Visit: 10/07/2019    PATIENT IDENTIFIER: [name] is a 63 y.o. female with a past medical history of HTn and T2DM. The patient has followed with Endocrinology clinic since 10/07/2019 for consultative assistance with management of her diabetes.      DIABETIC HISTORY:  [name] was diagnosed with gestational diabetes in 1984, requiring insulin  , she was off insulin  until her next pregnancy in 1985 and has been on insulin  ever since. Her hemoglobin A1c has ranged from 8.1% in 2016, peaking at 12.5% in 2020.   On her initial visit to our clinic, she had an A1c of 9.6%. She was on MDI regimen as well as victoza  and metformin , which were continued and added jardiance .   Developed yeast infections to Jardiance  12/2019  Switched victoza  to trulicity  12/2019  Switched to ozempic  2024  Metformin  discontinued 2024 due to low GFR   SUBJECTIVE:   During the last visit (10/10/2023): A1c 8.7%    Today (02/07/2024): [name] is here for a follow up on diabetes management.  She checks her blood sugars multiple times daily through CGM. She has  hypoglycemic episodes since her last visit. She is symptomatic with these episodes.    The patient continues to follow-up with podiatry , since her last visit here she had a left first great toe amputation 06/2023 , she already has a history of  left 3rd toe amputation 03/2023  Denies nausea, vomiting  Denies diarrhea but has occasional constipation , MOM works  Has occasional genital irritation     HOME DIABETES REGIMEN:  Tresiba   44 units daily Humalog  12 units with each meal Jardiance  25 mg daily  Ozempic  2 mg weekly  CF: Humalog  ((BG-130/30) TIDQAC    Statin: yes ACE-I/ARB: yes     CONTINUOUS GLUCOSE MONITORING  RECORD INTERPRETATION    Dates of Recording:  4/2-4/15/2025  Sensor description:dexcom  Results statistics:   CGM use % of time 94  Average and SD 202/33.3  Time in range 32%  % Time Above 180 42  % Time above 250 24  % Time Below target 2     Glycemic patterns summary: BGs are variable overnight and fluctuate during the day  Hyperglycemic episodes  postprandial   Hypoglycemic episodes occurred after a bolus  Overnight periods: Variable     Microvascular complications:  Neuropathy, hx left 3rd toe and left first toe amputations, retinopathy  Denies: CKD  Last eye exam: Completed 06/2019   Macrovascular complications:  CAD, CHF Denies:  PVD, CVA       HISTORY:  Past Medical History:  Past Medical History:  Diagnosis Date   Adhesive capsulitis of shoulder    bilateral, Steroid injection Dr. Jama 1/12 bilaterally   CAD (coronary artery disease)    nonobstructive. Last cardiac cath (2008) showing left circumflex with mid 50% stenosis and distal luminal irregularities. Also with RCA with mid to distal 30-40% stenosis. // Previously evaluated by [facility], never followed up outpatient.   CAP (community acquired pneumonia) 06/15/2012   05/2012 CXR: Mild opacification of the posterior lung base on the lateral film  as cannot exclude infection/atelectasis.     CHF (congestive heart failure) (HCC)    Diabetes mellitus 2007   Type II, insulin  dependent  Diabetic retinopathy    GERD (gastroesophageal reflux disease)    Glaucoma    Hyperlipidemia    Hypertension    Obesity    Palpitations    Monitor 10/2023: NSR, average heart rate 87, PVCs/PACs <1%, no sustained arrhythmias/A-fib/high-grade heart block   Peripheral neuropathy    Past Surgical History:  Past Surgical History:  Procedure Laterality Date   AMPUTATION TOE Left 03/2023   3rd left toe   AMPUTATION TOE Left 07/21/2023   Procedure: AMPUTATION LEFT GREAT TOE;  Surgeon: Malvin Marsa FALCON,  DPM;  Location: [facility] OR;  Service: Orthopedics/Podiatry;  Laterality: Left;   ANTERIOR CERVICAL DECOMP/DISCECTOMY FUSION N/A 06/16/2016   Procedure: Anterior Cervical Decompression/discectomy Fusion - Cervical six - Cervical seven;  Surgeon: Alm GORMAN Molt, MD;  Location: [facility] OR;  Service: Neurosurgery;  Laterality: N/A;  Anterior Cervical Decompression/discectomy Fusion - Cervical six - Cervical seven   APPENDECTOMY     CARDIAC CATHETERIZATION     CATARACT EXTRACTION     GLAUCOMA SURGERY     LEFT HEART CATH AND CORONARY ANGIOGRAPHY N/A 05/22/2018   Procedure: LEFT HEART CATH AND CORONARY ANGIOGRAPHY;  Surgeon: Burnard Debby LABOR, MD;  Location: [facility] INVASIVE CV LAB;  Service: Cardiovascular;  Laterality: N/A;   LEFT HEART CATHETERIZATION WITH CORONARY ANGIOGRAM N/A 01/24/2012   Procedure: LEFT HEART CATHETERIZATION WITH CORONARY ANGIOGRAM;  Surgeon: Ozell Fell, MD;  [facility] LAB;  Service: Cardiovascular;  Laterality: N/A;   POSTERIOR CERVICAL FUSION/FORAMINOTOMY N/A 10/22/2015   Procedure: Cervical three-Cervical Seven Posterior cervical fusion with lateral mass fixation,  Laminectomy Cervical Three-Cervical Seven;  Surgeon: Alm GORMAN Molt, MD;  Location: [facility] NEURO ORS;  Service: Neurosurgery;  Laterality: N/A;  Posterior   TUBAL LIGATION     Social History:  reports that she quit smoking about 17 years ago. Her smoking use included cigarettes. She started smoking about 24 years ago. She has a 2.1 pack-year smoking history. She has never used smokeless tobacco. She reports that she does not drink alcohol and does not use drugs. Family History:  Family History  Problem Relation Age of Onset   Heart disease Mother    Hypertension Sister    Hypertension Sister    Hypertension Sister    Heart attack Neg Hx    Stroke Neg Hx      HOME MEDICATIONS: Allergies as of 02/07/2024   No Known Allergies      Medication List        Accurate as of February 07, 2024  7:01 AM. If you have any  questions, ask your nurse or doctor.          amitriptyline  25 MG tablet Commonly known as: ELAVIL  Take 1 tablet (25 mg total) by mouth at bedtime.   ammonium lactate  12 % cream Commonly known as: AMLACTIN Apply 1 Application topically as needed for dry skin.   B-D SINGLE USE SWABS REGULAR Pads Use as directed   ciprofloxacin  500 MG tablet Commonly known as: Cipro  Take 1 tablet (500 mg total) by mouth 2 (two) times daily.   clopidogrel  75 MG tablet Commonly known as: PLAVIX  Take 1 tablet (75 mg total) by mouth daily.   Combigan 0.2-0.5 % ophthalmic solution Generic drug: brimonidine-timolol  Place 1 drop into both eyes 2 (two) times daily.   cyclobenzaprine  10 MG tablet Commonly known as: FLEXERIL  Take 1 tablet (10 mg total) by mouth 2 (two) times daily as needed.   Dexcom G7 Receiver Devi Use to check blood sugars  Dexcom G7 Sensor Misc Change sensor every 10 days   diclofenac  Sodium 1 % Gel Commonly known as: Voltaren  Apply 2 g topically 4 (four) times daily.   diphenhydrAMINE  25 MG tablet Commonly known as: BENADRYL  Take 1 tablet (25 mg total) by mouth every 6 (six) hours as needed for itching or allergies.   dorzolamide  2 % ophthalmic solution Commonly known as: TRUSOPT  Place 1 drop into both eyes 2 (two) times daily.   doxycycline  100 MG tablet Commonly known as: VIBRA -TABS Take 1 tablet (100 mg total) by mouth 2 (two) times daily.   empagliflozin  25 MG Tabs tablet Commonly known as: Jardiance  Take 1 tablet (25 mg total) by mouth daily.   furosemide  20 MG tablet Commonly known as: LASIX  Take 20 mg by mouth 2 (two) times daily.   Guaifenesin  1200 MG Tb12 Commonly known as: Mucinex  Maximum Strength Take 1 tablet (1,200 mg total) by mouth every 12 (twelve) hours as needed.   HYDROcodone -acetaminophen  5-325 MG tablet Commonly known as: NORCO/VICODIN Take 1-2 tablets by mouth every 6 (six) hours as needed.   insulin  lispro 100 UNIT/ML  injection Commonly known as: HumaLOG  Max daily 60 units   Insulin  Pen Needle 31G X 5 MM Misc 1 Device by Does not apply route in the morning, at noon, in the evening, and at bedtime.   isosorbide  mononitrate 60 MG 24 hr tablet Commonly known as: IMDUR  Take 1 tablet (60 mg total) by mouth daily.   lisinopril  2.5 MG tablet Commonly known as: ZESTRIL  Take 1 tablet (2.5 mg total) by mouth daily.   metoprolol  tartrate 25 MG tablet Commonly known as: LOPRESSOR  TAKE 2 TABLETS BY MOUTH IN THE AM, TAKE 1 TABLET BY MOUTH IN THE EVENING   montelukast  10 MG tablet Commonly known as: SINGULAIR  Take 1 tablet (10 mg total) by mouth at bedtime as needed.   nitroGLYCERIN  0.4 MG SL tablet Commonly known as: NITROSTAT  DISSOLVE 1 TABLET (0.4 MG TOTAL) UNDER THE TONGUE EVERY 5 MINUTES AS NEEDED FOR CHEST PAIN.   Omnipod 5 G7 Pods (Gen 5) Misc 1 Device by Does not apply route every other day.   Omnipod 5 G7 Intro (Gen 5) Kit 1 Device by Does not apply route every other day.   OneTouch Delica Lancets Fine Misc Check blood sugar as instructed up to 3 times a day   TRUEplus Lancets 33G Misc Check 4 times a daily   Ozempic  (2 MG/DOSE) 8 MG/3ML Sopn Generic drug: Semaglutide  (2 MG/DOSE) INJECT 2 MG INTO SKIN ONCE A WEEK   pantoprazole  40 MG tablet Commonly known as: PROTONIX  Take 1 tablet (40 mg total) by mouth daily.   pregabalin  75 MG capsule Commonly known as: Lyrica  Take 1 capsule (75 mg total) by mouth 2 (two) times daily.   ranolazine  500 MG 12 hr tablet Commonly known as: Ranexa  Take 1 tablet (500 mg total) by mouth 2 (two) times daily.   Rocklatan  0.02-0.005 % Soln Generic drug: Netarsudil -Latanoprost  Place 1 drop into both eyes at bedtime.   rosuvastatin  40 MG tablet Commonly known as: CRESTOR  TAKE 1 TABLET(40 MG) BY MOUTH DAILY         OBJECTIVE:   Vital Signs: LMP 02/13/2011   Wt Readings from Last 3 Encounters:  02/01/24 245 lb 9.6 oz (111.4 kg)  01/08/24 245  lb 9.6 oz (111.4 kg)  12/20/23 245 lb 9.6 oz (111.4 kg)     Exam: General: Pt appears well and is in NAD  Lungs: CTA  Heart:  RRR  Extremities: No edema  Neuro: MS is good with appropriate affect, pt is alert and Ox3     DM foot exam: per podiatry 10/02/2023    DATA REVIEWED:  Lab Results  Component Value Date   HGBA1C 8.7 (A) 10/10/2023   HGBA1C 10.2 (A) 07/12/2023   HGBA1C 9.6 (A) 08/19/2022    Latest Reference Range & Units 11/30/23 11:28  Sodium 135 - 146 mmol/L 138  Potassium 3.5 - 5.3 mmol/L 4.3  Chloride 98 - 110 mmol/L 103  CO2 20 - 32 mmol/L 25  Glucose 65 - 99 mg/dL 860 (H)  BUN 7 - 25 mg/dL 30 (H)  Creatinine -  9.49 - 1.05 mg/dL CANCELED 8.71 (H)  Calcium  8.6 - 10.4 mg/dL 9.6  BUN/Creatinine Ratio 6 - 22 (calc) 23 (H)  AG Ratio 1.0 - 2.5 (calc) 1.3  AST 10 - 35 U/L 13  ALT 6 - 29 U/L 13  Total Protein 6.1 - 8.1 g/dL 7.5  Total Bilirubin 0.2 - 1.2 mg/dL 0.4     ASSESSMENT / PLAN / RECOMMENDATIONS:   1) Type 2 Diabetes Mellitus, Poorly controlled, With retinopathic,  neuropathic, and macrovascular  complications complications - Most recent A1c of 8.7%. Goal A1c < 7.0 %.     -Her A1c has trended down from 10.9% to 8.7% -In reviewing CGM, patient has been noted postprandial hypoglycemia at times, I will decrease her prandial insulin  -I did again recommend insulin  pump technology, will prescribe the OmniPod, I have tried the tandem before but she was unable to get it -A referral to our CDE for training has been initiated -Metformin  discontinued due to low GFR  MEDICATIONS: - Continue Tresiba   44 units daily  - Decrease Humalog  12 units with each meal  - Continue  Jardiance   25 mg daily - Continue Ozempic  2 mg weekly - CF : Humalog   (BG -130/30) TIDQAC   EDUCATION / INSTRUCTIONS: BG monitoring instructions: Patient is instructed to check her blood sugars 4 times a day, before meals and bedtime  Call [facility] if: BG persistently  < 70  I reviewed the Rule of 15 for the treatment of hypoglycemia in detail with the patient. Literature supplied.    2) Diabetic complications:  Eye: Does have known diabetic retinopathy.  Neuro/ Feet: Does have known diabetic peripheral neuropathy .  Renal: Patient does not have known baseline CKD. She   is on an ACEI/ARB at present.      F/U in 4 months    Signed electronically by: Stefano Redgie Butts, MD  [facility]  [facility] [address] [place] [place], [place] [address] Phone: [phone] FAX: [phone]   CC: Carrie Roxan BROCKS, NP [address] [place] [address] Phone: [phone]  Fax: [phone]  Return to Endocrinology clinic as below: Future Appointments  Date Time Provider Department Center  02/07/2024  8:50 AM Beatric Fulop, Donell Redgie, MD [facility] None  02/07/2024  9:40 AM Ngetich, Roxan BROCKS, NP [facility] None  03/04/2024 10:15 AM Gershon Donnice SAUNDERS, DPM [facility]  03/07/2024  2:20 PM Cirigliano, Sandor GAILS, DO [facility]  03/26/2024  2:45 PM Lelon Hamilton T, PA-C [facility] H&V  05/31/2024  9:00 AM Ngetich, Roxan BROCKS, NP [facility] None

## 2024-02-14 NOTE — Therapy (Incomplete)
 OUTPATIENT PHYSICAL THERAPY LOWER EXTREMITY EVALUATION   Patient Name: [name] MRN: [id number] DOB:[birth year], 63 y.o., female Today's Date: 02/14/2024  END OF SESSION:   Past Medical History:  Diagnosis Date   Adhesive capsulitis of shoulder    bilateral, Steroid injection Dr. Jama 1/12 bilaterally   CAD (coronary artery disease)    nonobstructive. Last cardiac cath (2008) showing left circumflex with mid 50% stenosis and distal luminal irregularities. Also with RCA with mid to distal 30-40% stenosis. // Previously evaluated by [facility], never followed up outpatient.   CAP (community acquired pneumonia) 06/15/2012   05/2012 CXR: Mild opacification of the posterior lung base on the lateral film  as cannot exclude infection/atelectasis.     CHF (congestive heart failure) (HCC)    Diabetes mellitus 2007   Type II, insulin  dependent   Diabetic retinopathy    GERD (gastroesophageal reflux disease)    Glaucoma    Hyperlipidemia    Hypertension    Obesity    Palpitations    Monitor 10/2023: NSR, average heart rate 87, PVCs/PACs <1%, no sustained arrhythmias/A-fib/high-grade heart block   Peripheral neuropathy    Past Surgical History:  Procedure Laterality Date   AMPUTATION TOE Left 03/2023   3rd left toe   AMPUTATION TOE Left 07/21/2023   Procedure: AMPUTATION LEFT GREAT TOE;  Surgeon: Malvin Marsa FALCON, DPM;  Location: [facility] OR;  Service: Orthopedics/Podiatry;  Laterality: Left;   ANTERIOR CERVICAL DECOMP/DISCECTOMY FUSION N/A 06/16/2016   Procedure: Anterior Cervical Decompression/discectomy Fusion - Cervical six - Cervical seven;  Surgeon: Alm GORMAN Molt, MD;  Location: [name] OR;  Service: Neurosurgery;  Laterality: N/A;  Anterior Cervical Decompression/discectomy Fusion - Cervical six - Cervical seven   APPENDECTOMY     CARDIAC CATHETERIZATION     CATARACT EXTRACTION     GLAUCOMA SURGERY     LEFT HEART CATH AND CORONARY ANGIOGRAPHY N/A 05/22/2018   Procedure:  LEFT HEART CATH AND CORONARY ANGIOGRAPHY;  Surgeon: Burnard Debby LABOR, MD;  Location: [facility] INVASIVE CV LAB;  Service: Cardiovascular;  Laterality: N/A;   LEFT HEART CATHETERIZATION WITH CORONARY ANGIOGRAM N/A 01/24/2012   Procedure: LEFT HEART CATHETERIZATION WITH CORONARY ANGIOGRAM;  Surgeon: Ozell Fell, MD;  Location: [facility] CATH LAB;  Service: Cardiovascular;  Laterality: N/A;   POSTERIOR CERVICAL FUSION/FORAMINOTOMY N/A 10/22/2015   Procedure: Cervical three-Cervical Seven Posterior cervical fusion with lateral mass fixation,  Laminectomy Cervical Three-Cervical Seven;  Surgeon: Alm GORMAN Molt, MD;  Location: [facility] NEURO ORS;  Service: Neurosurgery;  Laterality: N/A;  Posterior   TUBAL LIGATION     Patient Active Problem List   Diagnosis Date Noted   Morbid (severe) obesity due to excess calories (HCC) 11/30/2023   S/P amputation of lesser toe, left (HCC) - Great Toe on 07-21-2023 07/22/2023   Osteomyelitis of great toe of left foot (HCC) 07/20/2023   Amputation of left great toe (HCC) 02/24/2023   Long term current use of oral hypoglycemic drug 02/24/2023   Long-term (current) use of injectable non-insulin  antidiabetic drugs 02/24/2023   Sinus tachycardia 11/16/2021   Diabetes mellitus (HCC) 10/07/2019   Type 2 diabetes mellitus with diabetic polyneuropathy, with long-term current use of insulin  (HCC) 10/07/2019   Type 2 diabetes mellitus with retinopathy, with long-term current use of insulin  (HCC) 10/07/2019   Dyslipidemia 10/07/2019   Old MI (myocardial infarction)    Chronic lower back pain 02/21/2018   Chronic pain of right knee 01/23/2018   Lateral pain of right hip 04/27/2017   Right foot ulcer (  HCC) 10/26/2016   Constipation 09/30/2016   History of colonic polyps 09/30/2016   Right foot pain 07/08/2016   Non-alcoholic fatty liver disease 05/13/2016   Spinal stenosis in cervical region 09/29/2015   Bilateral low back pain with sciatica    Long-term current use of opiate analgesic  07/11/2014   Onychomycosis 10/29/2013   Severe nonproliferative diabetic retinopathy (HCC) 04/04/2013   Primary open angle glaucoma 04/04/2013   Insomnia 06/15/2012   HFmrEF (heart failure with mildly reduced ejection fraction) (HCC) 01/30/2012   CAD (coronary artery disease) 11/02/2011   Preventative health care 07/13/2011   Adhesive capsulitis of right shoulder 11/16/2010   Gastroesophageal reflux disease 10/12/2009   Uncontrolled type 2 diabetes mellitus with hyperglycemia (HCC) 06/12/2006   Hyperlipidemia 06/12/2006   Obesity, Class II, BMI 35-39.9 06/12/2006   Essential hypertension 06/12/2006    PCP: Ngetich, Roxan BROCKS, NP   REFERRING PROVIDER: Sherlynn Madden, MD  REFERRING DIAG: M25.561,M25.562,G89.29 (ICD-10-CM) - Chronic pain of both knees  THERAPY DIAG:  No diagnosis found.  Rationale for Evaluation and Treatment: Rehabilitation  ONSET DATE: 12/20/2023  SUBJECTIVE:   SUBJECTIVE STATEMENT: ***  PERTINENT HISTORY: PMH: diabetes, osteomyelitis of L toe resulting in amputation, bilateral knee pain, CAD, CHF, glaucoma, HLD, HTN, obesity, peripheral neuropathy, hx of MI in 2018, hx of spinal surgery with residual neck stiffness   Per referring MD:  She experiences bilateral knee pain due to cartilage loss, with both knees bothering her. Her A1c was previously 10 and needs to be reduced to about 7 before undergoing knee surgery. She uses a walker due to her knees giving out, which has led to frequent falls. She tried gel injections for her knees about a year or two ago, which provided some relief but were not effective long-term.    PAIN:  Are you having pain? {OPRCPAIN:27236}  PRECAUTIONS: {Therapy precautions:24002}  RED FLAGS: {PT Red Flags:29287}   WEIGHT BEARING RESTRICTIONS: {Yes ***/No:24003}  FALLS:  Has patient fallen in last 6 months? {fallsyesno:27318}  LIVING ENVIRONMENT: Lives with: {OPRC lives with:25569::lives with their family} Lives  in: {Lives in:25570} Stairs: {opstairs:27293} Has following equipment at home: {Assistive devices:23999}  OCCUPATION: ***  PLOF: {PLOF:24004}  PATIENT GOALS: ***  NEXT MD VISIT: ***  OBJECTIVE:  Note: Objective measures were completed at Evaluation unless otherwise noted.  DIAGNOSTIC FINDINGS: ***  PATIENT SURVEYS:  {rehab surveys:24030}  COGNITION: Overall cognitive status: {cognition:24006}     SENSATION: {sensation:27233}  EDEMA:  {edema:24020}  MUSCLE LENGTH: Hamstrings: Right *** deg; Left *** deg Debby test: Right *** deg; Left *** deg  POSTURE: {posture:25561}  PALPATION: ***  LOWER EXTREMITY ROM:  {AROM/PROM:27142} ROM Right eval Left eval  Hip flexion    Hip extension    Hip abduction    Hip adduction    Hip internal rotation    Hip external rotation    Knee flexion    Knee extension    Ankle dorsiflexion    Ankle plantarflexion    Ankle inversion    Ankle eversion     (Blank rows = not tested)  LOWER EXTREMITY MMT:  MMT Right eval Left eval  Hip flexion    Hip extension    Hip abduction    Hip adduction    Hip internal rotation    Hip external rotation    Knee flexion    Knee extension    Ankle dorsiflexion    Ankle plantarflexion    Ankle inversion    Ankle eversion     (  Blank rows = not tested)  LOWER EXTREMITY SPECIAL TESTS:  {LEspecialtests:26242}  FUNCTIONAL TESTS:  {Functional tests:24029}  GAIT: Distance walked: *** Assistive device utilized: {Assistive devices:23999} Level of assistance: {Levels of assistance:24026} Comments: ***                                                                                                                                TREATMENT DATE: ***    PATIENT EDUCATION:  Education details: *** Person educated: {Person educated:25204} Education method: {Education Method:25205} Education comprehension: {Education Comprehension:25206}  HOME EXERCISE  PROGRAM: ***  ASSESSMENT:  CLINICAL IMPRESSION: Patient is a *** y.o. *** who was seen today for physical therapy evaluation and treatment for ***.   OBJECTIVE IMPAIRMENTS: {opptimpairments:25111}.   ACTIVITY LIMITATIONS: {activitylimitations:27494}  PARTICIPATION LIMITATIONS: {participationrestrictions:25113}  PERSONAL FACTORS: {Personal factors:25162} are also affecting patient's functional outcome.   REHAB POTENTIAL: {rehabpotential:25112}  CLINICAL DECISION MAKING: {clinical decision making:25114}  EVALUATION COMPLEXITY: {Evaluation complexity:25115}   GOALS: Goals reviewed with patient? {yes/no:20286}  SHORT TERM GOALS: Target date: *** *** Baseline: Goal status: INITIAL  2.  *** Baseline:  Goal status: INITIAL  3.  *** Baseline:  Goal status: INITIAL  4.  *** Baseline:  Goal status: INITIAL  5.  *** Baseline:  Goal status: INITIAL  6.  *** Baseline:  Goal status: INITIAL  LONG TERM GOALS: Target date: ***  *** Baseline:  Goal status: INITIAL  2.  *** Baseline:  Goal status: INITIAL  3.  *** Baseline:  Goal status: INITIAL  4.  *** Baseline:  Goal status: INITIAL  5.  *** Baseline:  Goal status: INITIAL  6.  *** Baseline:  Goal status: INITIAL   PLAN:  PT FREQUENCY: {rehab frequency:25116}  PT DURATION: {rehab duration:25117}  PLANNED INTERVENTIONS: {rehab planned interventions:25118::97110-Therapeutic exercises,97530- Therapeutic [phone]- Neuromuscular re-education,97535- Self Rjmz,02859- Manual therapy}  PLAN FOR NEXT SESSION: ***   Hadeel Hillebrand N Irfan Veal, PT,DPT 02/14/2024, 9:15 AM

## 2024-02-15 ENCOUNTER — Ambulatory Visit: Attending: Sports Medicine | Admitting: Physical Therapy

## 2024-02-16 ENCOUNTER — Telehealth: Payer: Self-pay | Admitting: Dietician

## 2024-02-16 NOTE — Telephone Encounter (Signed)
 Message received from [facility] who states that they faxed something to our office related to the need for patient's Dexcom.  Their number is [phone].  They request the info be faxed back or call the above number.  Leita Constable, RD, LDN, CDCES, DipACLM

## 2024-02-19 NOTE — Telephone Encounter (Signed)
 Order was placed on [facility] on 02/07/24 and office notes resent again today.

## 2024-02-27 ENCOUNTER — Encounter: Payer: Self-pay | Admitting: Internal Medicine

## 2024-02-27 ENCOUNTER — Ambulatory Visit (INDEPENDENT_AMBULATORY_CARE_PROVIDER_SITE_OTHER): Admitting: Internal Medicine

## 2024-02-27 VITALS — BP 124/70 | HR 78 | Ht 66.0 in | Wt 233.0 lb

## 2024-02-27 DIAGNOSIS — E1159 Type 2 diabetes mellitus with other circulatory complications: Secondary | ICD-10-CM

## 2024-02-27 DIAGNOSIS — E1142 Type 2 diabetes mellitus with diabetic polyneuropathy: Secondary | ICD-10-CM | POA: Diagnosis not present

## 2024-02-27 DIAGNOSIS — E11319 Type 2 diabetes mellitus with unspecified diabetic retinopathy without macular edema: Secondary | ICD-10-CM

## 2024-02-27 DIAGNOSIS — S98139A Complete traumatic amputation of one unspecified lesser toe, initial encounter: Secondary | ICD-10-CM

## 2024-02-27 DIAGNOSIS — N1831 Chronic kidney disease, stage 3a: Secondary | ICD-10-CM

## 2024-02-27 DIAGNOSIS — Z794 Long term (current) use of insulin: Secondary | ICD-10-CM | POA: Diagnosis not present

## 2024-02-27 DIAGNOSIS — E1122 Type 2 diabetes mellitus with diabetic chronic kidney disease: Secondary | ICD-10-CM

## 2024-02-27 LAB — POCT GLYCOSYLATED HEMOGLOBIN (HGB A1C): Hemoglobin A1C: 9 % — AB (ref 4.0–5.6)

## 2024-02-27 MED ORDER — OMNIPOD 5 G7 PODS (GEN 5) MISC: 1.0000 | 3 refills | Status: DC

## 2024-02-27 MED ORDER — INSULIN LISPRO 100 UNIT/ML IJ SOLN: INTRAMUSCULAR | 3 refills | Status: DC

## 2024-02-27 MED ORDER — DEXCOM G7 SENSOR MISC: 3 refills | Status: DC

## 2024-02-27 MED ORDER — EMPAGLIFLOZIN 25 MG PO TABS: 25.0000 mg | ORAL_TABLET | Freq: Every day | ORAL | 3 refills | Status: DC

## 2024-02-27 MED ORDER — OZEMPIC (2 MG/DOSE) 8 MG/3ML ~~LOC~~ SOPN: 2.0000 mg | PEN_INJECTOR | SUBCUTANEOUS | 3 refills | Status: DC

## 2024-02-27 NOTE — Patient Instructions (Signed)
 Enter 6 g of carbohydrates with each meal and to the insulin  pump, and 3 g with a snack    HOW TO TREAT LOW BLOOD SUGARS (Blood sugar LESS THAN 70 MG/DL) Please follow the RULE OF 15 for the treatment of hypoglycemia treatment (when your (blood sugars are less than 70 mg/dL)   STEP 1: Take 15 grams of carbohydrates when your blood sugar is low, which includes:  3-4 GLUCOSE TABS  OR 3-4 OZ OF JUICE OR REGULAR SODA OR ONE TUBE OF GLUCOSE GEL    STEP 2: RECHECK blood sugar in 15 MINUTES STEP 3: If your blood sugar is still low at the 15 minute recheck --> then, go back to STEP 1 and treat AGAIN with another 15 grams of carbohydrates.

## 2024-02-27 NOTE — Progress Notes (Signed)
 Name: [name]  Age/ Sex: 63 y.o., female   MRN/ DOB: [id number], 1961-03-02     PCP: Leonarda Roxan JAYSON, NP   Reason for Endocrinology Evaluation: Type 2 Diabetes Mellitus  Initial Endocrine Consultative Visit: 10/07/2019    PATIENT IDENTIFIER: [name] is a 63 y.o. female with a past medical history of HTn and T2DM. The patient has followed with Endocrinology clinic since 10/07/2019 for consultative assistance with management of her diabetes.      DIABETIC HISTORY:  [name] was diagnosed with gestational diabetes in 1984, requiring insulin  , she was off insulin  until her next pregnancy in 1985 and has been on insulin  ever since. Her hemoglobin A1c has ranged from 8.1% in 2016, peaking at 12.5% in 2020.   On her initial visit to our clinic, she had an A1c of 9.6%. She was on MDI regimen as well as victoza  and metformin , which were continued and added jardiance .   Developed yeast infections to Jardiance  12/2019  Switched victoza  to trulicity  12/2019  Switched to ozempic  2024  Metformin  discontinued 2024 due to low GFR    She was trained on the OmniPod in May, 2025  SUBJECTIVE:   During the last visit (10/10/2023): A1c 8.7%    Today (02/27/2024): [name] is here for a follow up on diabetes management.  She checks her blood sugars multiple times daily through CGM. She has not hypoglycemic episodes since her last visit.    The patient continues to follow-up with podiatry for history of left second toe amputation and right second toe preulcerative lesion  Denies nausea, vomiting  Denies diarrhea but has occasional constipation   This patient with type 2 diabetes is treated with omnipod (insulin  pump). During the visit the pump basal and bolus doses were reviewed including carb/insulin  rations and supplemental doses. The clinical list was updated. The glucose meter download was reviewed in detail to determine if the current pump settings are providing the  best glycemic control without excessive hypoglycemia.  Pump and meter download:  Current P       Type & Model of Pump: OmniPod Insulin  Type: Currently using NovoLog .  Body mass index is 37.61 kg/m.  PUMP STATISTICS: Average BG: n/a  Average Daily Carbs (g): 4  Average Total Daily Insulin : 15.9  Average Daily Basal: 9.2 (58 %) Average Daily Bolus: 6.7 (42 %)     HOME DIABETES REGIMEN:  Jardiance  25 mg daily  Ozempic  2 mg weekly      Statin: yes ACE-I/ARB: yes     CONTINUOUS GLUCOSE MONITORING RECORD INTERPRETATION    Dates of Recording:  8/20-02/27/2024  Sensor description:dexcom  Results statistics:   CGM use % of time 89  Average and SD 170/57  Time in range 55%  % Time Above 180 35  % Time above 250 9  % Time Below target 1     Glycemic patterns summary: BGs are optimal overnight and fluctuate during the day  Hyperglycemic episodes  postprandial   Hypoglycemic episodes occurred N/A  Overnight periods: Mostly optimal    Microvascular complications:  Neuropathy, hx left 3rd toe and left first toe amputations, retinopathy  Denies: CKD  Last eye exam: Completed 06/2019   Macrovascular complications:  CAD, CHF Denies:  PVD, CVA       HISTORY:  Past Medical History:  Past Medical History:  Diagnosis Date   Adhesive capsulitis of shoulder    bilateral, Steroid injection Dr. Jama 1/12 bilaterally   CAD (coronary  artery disease)    nonobstructive. Last cardiac cath (2008) showing left circumflex with mid 50% stenosis and distal luminal irregularities. Also with RCA with mid to distal 30-40% stenosis. // Previously evaluated by [facility], never followed up outpatient.   CAP (community acquired pneumonia) 06/15/2012   05/2012 CXR: Mild opacification of the posterior lung base on the lateral film  as cannot exclude infection/atelectasis.     CHF (congestive heart failure) (HCC)    Diabetes mellitus 2007   Type II, insulin  dependent    Diabetic retinopathy    GERD (gastroesophageal reflux disease)    Glaucoma    Hyperlipidemia    Hypertension    Obesity    Palpitations    Monitor 10/2023: NSR, average heart rate 87, PVCs/PACs <1%, no sustained arrhythmias/A-fib/high-grade heart block   Peripheral neuropathy    Past Surgical History:  Past Surgical History:  Procedure Laterality Date   AMPUTATION TOE Left 03/2023   3rd left toe   AMPUTATION TOE Left 07/21/2023   Procedure: AMPUTATION LEFT GREAT TOE;  Surgeon: Malvin Marsa FALCON, DPM;  Location: [facility] OR;  Service: Orthopedics/Podiatry;  Laterality: Left;   ANTERIOR CERVICAL DECOMP/DISCECTOMY FUSION N/A 06/16/2016   Procedure: Anterior Cervical Decompression/discectomy Fusion - Cervical six - Cervical seven;  Surgeon: Alm GORMAN Molt, MD;  Location: [name] OR;  Service: Neurosurgery;  Laterality: N/A;  Anterior Cervical Decompression/discectomy Fusion - Cervical six - Cervical seven   APPENDECTOMY     CARDIAC CATHETERIZATION     CATARACT EXTRACTION     GLAUCOMA SURGERY     LEFT HEART CATH AND CORONARY ANGIOGRAPHY N/A 05/22/2018   Procedure: LEFT HEART CATH AND CORONARY ANGIOGRAPHY;  Surgeon: Burnard Debby LABOR, MD;  Location: [facility] INVASIVE CV LAB;  Service: Cardiovascular;  Laterality: N/A;   LEFT HEART CATHETERIZATION WITH CORONARY ANGIOGRAM N/A 01/24/2012   Procedure: LEFT HEART CATHETERIZATION WITH CORONARY ANGIOGRAM;  Surgeon: Ozell Fell, MD;  Location: [facility] CATH LAB;  Service: Cardiovascular;  Laterality: N/A;   POSTERIOR CERVICAL FUSION/FORAMINOTOMY N/A 10/22/2015   Procedure: Cervical three-Cervical Seven Posterior cervical fusion with lateral mass fixation,  Laminectomy Cervical Three-Cervical Seven;  Surgeon: Alm GORMAN Molt, MD;  Location: [facility] NEURO ORS;  Service: Neurosurgery;  Laterality: N/A;  Posterior   TUBAL LIGATION     Social History:  reports that she quit smoking about 17 years ago. Her smoking use included cigarettes. She started smoking about 24 years ago.  She has a 2.1 pack-year smoking history. She has never used smokeless tobacco. She reports that she does not drink alcohol and does not use drugs. Family History:  Family History  Problem Relation Age of Onset   Heart disease Mother    Hypertension Sister    Hypertension Sister    Hypertension Sister    Heart attack Neg Hx    Stroke Neg Hx      HOME MEDICATIONS: Allergies as of 02/27/2024   No Known Allergies      Medication List        Accurate as of February 27, 2024  3:05 PM. If you have any questions, ask your nurse or doctor.          amitriptyline  25 MG tablet Commonly known as: ELAVIL  Take 1 tablet (25 mg total) by mouth at bedtime.   ammonium lactate  12 % cream Commonly known as: AMLACTIN Apply 1 Application topically as needed for dry skin.   B-D SINGLE USE SWABS REGULAR Pads Use as directed   ciprofloxacin  500 MG tablet Commonly known  as: Cipro  Take 1 tablet (500 mg total) by mouth 2 (two) times daily.   clopidogrel  75 MG tablet Commonly known as: PLAVIX  Take 1 tablet (75 mg total) by mouth daily.   Combigan 0.2-0.5 % ophthalmic solution Generic drug: brimonidine-timolol  Place 1 drop into both eyes 2 (two) times daily.   cyclobenzaprine  10 MG tablet Commonly known as: FLEXERIL  Take 1 tablet (10 mg total) by mouth 2 (two) times daily as needed.   Dexcom G7 Receiver Devi Use to check blood sugars   Dexcom G7 Sensor Misc Change sensor every 10 days   diclofenac  Sodium 1 % Gel Commonly known as: Voltaren  Apply 2 g topically 4 (four) times daily.   diphenhydrAMINE  25 MG tablet Commonly known as: BENADRYL  Take 1 tablet (25 mg total) by mouth every 6 (six) hours as needed for itching or allergies.   dorzolamide  2 % ophthalmic solution Commonly known as: TRUSOPT  Place 1 drop into both eyes 2 (two) times daily.   doxycycline  100 MG tablet Commonly known as: VIBRA -TABS Take 1 tablet (100 mg total) by mouth 2 (two) times daily.    empagliflozin  25 MG Tabs tablet Commonly known as: Jardiance  Take 1 tablet (25 mg total) by mouth daily.   furosemide  20 MG tablet Commonly known as: LASIX  Take 20 mg by mouth 2 (two) times daily.   Guaifenesin  1200 MG Tb12 Commonly known as: Mucinex  Maximum Strength Take 1 tablet (1,200 mg total) by mouth every 12 (twelve) hours as needed.   HYDROcodone -acetaminophen  5-325 MG tablet Commonly known as: NORCO/VICODIN Take 1-2 tablets by mouth every 6 (six) hours as needed.   insulin  lispro 100 UNIT/ML injection Commonly known as: HumaLOG  Max daily 60 units   Insulin  Pen Needle 31G X 5 MM Misc 1 Device by Does not apply route in the morning, at noon, in the evening, and at bedtime.   isosorbide  mononitrate 60 MG 24 hr tablet Commonly known as: IMDUR  Take 1 tablet (60 mg total) by mouth daily.   lisinopril  2.5 MG tablet Commonly known as: ZESTRIL  Take 1 tablet (2.5 mg total) by mouth daily.   metoprolol  tartrate 25 MG tablet Commonly known as: LOPRESSOR  TAKE 2 TABLETS BY MOUTH IN THE AM, TAKE 1 TABLET BY MOUTH IN THE EVENING   montelukast  10 MG tablet Commonly known as: SINGULAIR  Take 1 tablet (10 mg total) by mouth at bedtime as needed.   nitroGLYCERIN  0.4 MG SL tablet Commonly known as: NITROSTAT  DISSOLVE 1 TABLET (0.4 MG TOTAL) UNDER THE TONGUE EVERY 5 MINUTES AS NEEDED FOR CHEST PAIN.   Omnipod 5 G7 Pods (Gen 5) Misc 1 Device by Does not apply route every other day.   Omnipod 5 G7 Intro (Gen 5) Kit 1 Device by Does not apply route every other day.   OneTouch Delica Lancets Fine Misc Check blood sugar as instructed up to 3 times a day   TRUEplus Lancets 33G Misc Check 4 times a daily   Ozempic  (2 MG/DOSE) 8 MG/3ML Sopn Generic drug: Semaglutide  (2 MG/DOSE) INJECT 2 MG INTO SKIN ONCE A WEEK   pantoprazole  40 MG tablet Commonly known as: PROTONIX  Take 1 tablet (40 mg total) by mouth daily.   pregabalin  75 MG capsule Commonly known as: Lyrica  Take 1  capsule (75 mg total) by mouth 2 (two) times daily.   ranolazine  500 MG 12 hr tablet Commonly known as: Ranexa  Take 1 tablet (500 mg total) by mouth 2 (two) times daily.   Rocklatan  0.02-0.005 % Soln Generic drug: Netarsudil -Latanoprost   Place 1 drop into both eyes at bedtime.   rosuvastatin  40 MG tablet Commonly known as: CRESTOR  TAKE 1 TABLET(40 MG) BY MOUTH DAILY         OBJECTIVE:   Vital Signs: BP 124/70 (BP Location: Left Arm, Patient Position: Sitting, Cuff Size: Normal)   Pulse 78   Ht 5' 6 (1.676 m)   Wt 233 lb (105.7 kg)   LMP 02/13/2011   SpO2 96%   BMI 37.61 kg/m   Wt Readings from Last 3 Encounters:  02/27/24 233 lb (105.7 kg)  02/01/24 245 lb 9.6 oz (111.4 kg)  01/08/24 245 lb 9.6 oz (111.4 kg)     Exam: General: Pt appears well and is in NAD  Lungs: CTA  Heart: RRR  Extremities: No edema  Neuro: MS is good with appropriate affect, pt is alert and Ox3     DM foot exam: per podiatry 10/02/2023    DATA REVIEWED:  Lab Results  Component Value Date   HGBA1C 8.7 (A) 10/10/2023   HGBA1C 10.2 (A) 07/12/2023   HGBA1C 9.6 (A) 08/19/2022    Latest Reference Range & Units 11/30/23 11:28  Sodium 135 - 146 mmol/L 138  Potassium 3.5 - 5.3 mmol/L 4.3  Chloride 98 - 110 mmol/L 103  CO2 20 - 32 mmol/L 25  Glucose 65 - 99 mg/dL 860 (H)  BUN 7 - 25 mg/dL 30 (H)  Creatinine -  9.49 - 1.05 mg/dL CANCELED 8.71 (H)  Calcium  8.6 - 10.4 mg/dL 9.6  BUN/Creatinine Ratio 6 - 22 (calc) 23 (H)  AG Ratio 1.0 - 2.5 (calc) 1.3  AST 10 - 35 U/L 13  ALT 6 - 29 U/L 13  Total Protein 6.1 - 8.1 g/dL 7.5  Total Bilirubin 0.2 - 1.2 mg/dL 0.4     ASSESSMENT / PLAN / RECOMMENDATIONS:   1) Type 2 Diabetes Mellitus, Poorly controlled, With retinopathic,  neuropathic, and macrovascular  complications complications - Most recent A1c of 9.0%. Goal A1c < 7.0 %.     -Patient continues with poorly controlled diabetes -She has been using OmniPod since May, 2025 but she is  having difficulty with obtaining Dexcom sensors, she is also having multiple faulty sensors, she was provided with number to Dexcom sensors -Patient was encouraged to calibrate the Dexcom with new sensors -She did not have her PDM today, unable to make changes -Metformin  discontinued due to low GFR - Patient has not been bolusing for meals just for correction, I encouraged the patient to enter 6 g of carbohydrate with meals from now on  MEDICATIONS: - Continue  Jardiance   25 mg daily - Continue Ozempic  2 mg weekly    Pump   OmniPod Settings   Insulin  type   Humalog    Basal rate       0000 1.25 u/h               I:C ratio       0000  1:1    6 g with meals , 3g with snacks              Sensitivity       0000  30      Goal       0000  110        EDUCATION / INSTRUCTIONS: BG monitoring instructions: Patient is instructed to check her blood sugars 4 times a day, before meals and bedtime  Call [facility] if: BG persistently < 70  I reviewed  the Rule of 15 for the treatment of hypoglycemia in detail with the patient. Literature supplied.    2) Diabetic complications:  Eye: Does have known diabetic retinopathy.  Neuro/ Feet: Does have known diabetic peripheral neuropathy .  Renal: Patient does not have known baseline CKD. She   is on an ACEI/ARB at present.      F/U in 2 months    Signed electronically by: Stefano Redgie Butts, MD  [facility]  [facility] [address] [place] [place], [place] [address] Phone: [phone] FAX: [phone]   CC: Leonarda Roxan BROCKS, NP [address] [place] [address] Phone: [phone]  Fax: [phone]  Return to Endocrinology clinic as below: Future Appointments  Date Time Provider Department Center  03/04/2024 10:15 AM Gershon Donnice SAUNDERS, DPM [facility]  03/07/2024  2:20 PM Cirigliano, Sandor GAILS, DO [facility]  03/26/2024  2:45 PM Lelon Hamilton T, PA-C  [facility]  05/31/2024  9:00 AM Ngetich, Roxan BROCKS, NP [facility] None

## 2024-02-28 ENCOUNTER — Encounter: Payer: Self-pay | Admitting: Internal Medicine

## 2024-03-04 ENCOUNTER — Ambulatory Visit (INDEPENDENT_AMBULATORY_CARE_PROVIDER_SITE_OTHER): Admitting: Podiatry

## 2024-03-04 DIAGNOSIS — L84 Corns and callosities: Secondary | ICD-10-CM | POA: Diagnosis not present

## 2024-03-04 DIAGNOSIS — S98132A Complete traumatic amputation of one left lesser toe, initial encounter: Secondary | ICD-10-CM

## 2024-03-04 DIAGNOSIS — Z794 Long term (current) use of insulin: Secondary | ICD-10-CM | POA: Diagnosis not present

## 2024-03-04 DIAGNOSIS — E1142 Type 2 diabetes mellitus with diabetic polyneuropathy: Secondary | ICD-10-CM

## 2024-03-04 NOTE — Progress Notes (Signed)
 Subjective: Chief Complaint  Patient presents with   Pre-ulcerative calluses    Pt stated that she is doing great she stated that there is no pain or anything    63 year old female presents the office today status post left second toe amputation.  She has a history of history of a hallux amputation.  These are healed.  She does not report any open lesions she is wearing her old diabetic shoes today.  She is asking for new shoes.   On Plavix    Objective: AAO x3, NAD DP/PT pulses palpable bilaterally, CRT less than 3 seconds Recent second toe partial potation which is healed.  There is previous hallux amputation noted.  There is hammertoe contractures present resulting hyperkeratotic lesion the distal aspect the right hallux, 2nd and 3rd toes without any underlying ulceration, drainage or signs of infection but on the second toe on the right foot the area is preulcerative.  Nails on the right second, fourth, fifth and left 4th and 5th nails are hypertrophic, dystrophic discomfort as they are rubbing the adjacent toes. No pain with calf compression, swelling, warmth, erythema  Assessment: Status post left second toe amputation; right second toe preulcerative lesion; symptomatic onychomycosis  Plan: Status post left toe amputation; diabetic foot exam - Incisions are healing well between regular shoe.  Discussed reports daily foot inspection. Referral to [name] for diabetic shoes given history of neuropathy, toe amputations. - Debrided the preulcerative callus with any complications or bleeding  Return in about 4 weeks (around 04/01/2024).  Donnice JONELLE Fees DPM

## 2024-03-07 ENCOUNTER — Ambulatory Visit: Admitting: Gastroenterology

## 2024-03-08 ENCOUNTER — Other Ambulatory Visit: Payer: Self-pay | Admitting: Internal Medicine

## 2024-03-08 ENCOUNTER — Encounter: Payer: Self-pay | Admitting: Lab

## 2024-03-22 ENCOUNTER — Encounter: Payer: Self-pay | Admitting: *Deleted

## 2024-03-25 ENCOUNTER — Encounter: Payer: Self-pay | Admitting: Physician Assistant

## 2024-03-25 ENCOUNTER — Encounter: Payer: Self-pay | Admitting: *Deleted

## 2024-03-25 NOTE — Assessment & Plan Note (Signed)
 Status post non-STEMI in November 2019. Cardiac catheterization revealed diffuse CAD with no targets for CABG. Managed with medical therapy. ***

## 2024-03-25 NOTE — Assessment & Plan Note (Signed)
 Secondary to non-ischemic cardiomyopathy. Ejection fraction improved from 35-40% to 45-50% by echocardiogram in 2019. ***

## 2024-03-25 NOTE — Progress Notes (Unsigned)
 OFFICE NOTE:    Date:  03/26/2024  ID:  [name], DOB April 09, 1961, MRN [id number] PCP: Leonarda Roxan JAYSON, NP   [facility] Providers Cardiologist:  Ozell Fell, MD Cardiology APP:  Lelon Glendia DASEN, PA-C        Coronary artery disease  NSTEMI 04/2018 >> Cath w/ diffuse disease >> no targets for CABG >> Med Rx Cath: pLAD 50, mLAD 80, dLAD 70; oD1 95, mLCx 90, dLCx 80, OM2 95, mRCA 30, dRCA 30 PCI could be considered if medical Rx failed HFmrEF (heart failure with mildly reduced ejection fraction)  Non-ischemic cardiomyopathy (CM out of proportion to extensive CAD)  Echocardiogram 7/13: EF 35-40 Echocardiogram 11/16: EF 55-60 Echocardiogram 8/18: EF 50-55 Echocardiogram 11/19: Mild conc LVH, EF 45-50, diff HK, normal RVSF  Aortic atherosclerosis  ABIs 07/20/2023: Normal (L 1.08, R 1.18) Palpitations Monitor 10/2023: NSR, average heart rate 87, PVCs/PACs <1%, no sustained arrhythmias/A-fib/high-grade heart block  Morbid obesity  Diabetes mellitus  Osteomyelitis 06/2023 s/p left great toe amputation Hypertension  Hyperlipidemia  Chronic kidney disease Lung nodule (followed by primary care)       Discussed the use of AI scribe software for clinical note transcription with the patient, who gave verbal consent to proceed. History of Present Illness [name] is a 63 y.o. female who returns for follow up of CHF, CAD. She was last seen in 09/2023. She noted palpitations. Monitor x 2 weeks was obtained and demonstrated no significant arrhythmias.   She is here today with her daughter. She experiences occasional shortness of breath with exertion, which has remained stable without worsening. No chest symptoms, pressure, or tightness are present, and she does not experience orthopnea. She uses multiple pillows to sleep, a long-standing practice, and denies recent episodes of presyncope, although she experienced this before a heart monitor was placed in May, which showed  no abnormal rhythms. She recently took herself off her diuretics for about three to four weeks due to a lack of perceived fluid retention. She has since resumed taking them as prescribed, resulting in a significant decrease in swelling.    ROS-See HPI    Studies Reviewed:       11/30/23: K 4.3, SCr 1.28, ALT 13, Hgb 12.5, TSH 1.96         Physical Exam:  VS:  BP 110/60   Pulse 86   Ht 5' 6 (1.676 m)   Wt 238 lb 14.4 oz (108.4 kg)   LMP 02/13/2011   SpO2 98%   BMI 38.56 kg/m        Wt Readings from Last 3 Encounters:  03/26/24 238 lb 14.4 oz (108.4 kg)  02/27/24 233 lb (105.7 kg)  02/01/24 245 lb 9.6 oz (111.4 kg)    Constitutional:      Appearance: Healthy appearance. Not in distress.  Neck:     Vascular: No JVR. JVD normal.  Pulmonary:     Breath sounds: Normal breath sounds. No wheezing. No rales.  Cardiovascular:     Normal rate. Regular rhythm.     Murmurs: There is no murmur.  Edema:    Peripheral edema present.    Ankle: bilateral trace edema of the ankle. Abdominal:     Palpations: Abdomen is soft.       Assessment and Plan:    Assessment & Plan HFmrEF (heart failure with mildly reduced ejection fraction) (HCC) Secondary to non-ischemic cardiomyopathy. Ejection fraction improved from 35-40% to 45-50% by echocardiogram in 2019.  She  stopped taking her furosemide  a few weeks ago.  However, she resumed this recently.  Her swelling has improved.  Overall, volume status is stable.  She is NYHA IIb. - Continue Jardiance  25 mg daily (diabetes), furosemide  20 mg twice daily, isosorbide  mononitrate 60 mg daily, lisinopril  2.5 mg daily, metoprolol  tartrate 25 mg twice daily - Follow-up 6 months Coronary artery disease involving native coronary artery of native heart with angina pectoris Status post non-STEMI in November 2019. Cardiac catheterization revealed diffuse CAD with no targets for CABG. Managed with medical therapy.  She is doing well without chest symptoms to  suggest angina. - Continue Plavix  75 mg daily, isosorbide  mononitrate 60 mg daily, metoprolol  tartrate 25 mg twice daily, ranolazine  500 mg twice daily, rosuvastatin  40 mg daily Mixed hyperlipidemia Continue rosuvastatin  40 mg daily.  Obtain fasting lipids today. Essential hypertension Blood pressure controlled.   - Continue isosorbide  mononitrate 60 mg daily, lisinopril  2.5 mg daily, metoprolol  tartrate 25 mg twice daily.         Dispo:  Return in about 6 months (around 09/23/2024) for Routine Follow Up, w/ Dr. Wonda, or Glendia Ferrier, PA-C.  Signed, Glendia Ferrier, PA-C

## 2024-03-26 ENCOUNTER — Ambulatory Visit: Attending: Physician Assistant | Admitting: Physician Assistant

## 2024-03-26 ENCOUNTER — Encounter: Payer: Self-pay | Admitting: Physician Assistant

## 2024-03-26 VITALS — BP 110/60 | HR 86 | Ht 66.0 in | Wt 238.9 lb

## 2024-03-26 DIAGNOSIS — I5020 Unspecified systolic (congestive) heart failure: Secondary | ICD-10-CM

## 2024-03-26 DIAGNOSIS — I25119 Atherosclerotic heart disease of native coronary artery with unspecified angina pectoris: Secondary | ICD-10-CM

## 2024-03-26 DIAGNOSIS — E782 Mixed hyperlipidemia: Secondary | ICD-10-CM | POA: Diagnosis not present

## 2024-03-26 DIAGNOSIS — I10 Essential (primary) hypertension: Secondary | ICD-10-CM | POA: Diagnosis not present

## 2024-03-26 MED ORDER — METOPROLOL TARTRATE 25 MG PO TABS: ORAL_TABLET | ORAL | Status: AC

## 2024-03-26 NOTE — Patient Instructions (Signed)
 Medication Instructions:  Your physician recommends that you continue on your current medications as directed. Please refer to the Current Medication list given to you today.  *If you need a refill on your cardiac medications before your next appointment, please call your pharmacy*  Lab Work: TODAY:  LIPID  If you have labs (blood work) drawn today and your tests are completely normal, you will receive your results only by: MyChart Message (if you have MyChart) OR A paper copy in the mail If you have any lab test that is abnormal or we need to change your treatment, we will call you to review the results.  Testing/Procedures: None ordered  Follow-Up: At [facility], you and your health needs are our priority.  As part of our continuing mission to provide you with exceptional heart care, our providers are all part of one team.  This team includes your primary Cardiologist (physician) and Advanced Practice Providers or APPs (Physician Assistants and Nurse Practitioners) who all work together to provide you with the care you need, when you need it.  Your next appointment:   6 month(s)  Provider:   Glendia Ferrier, PA-C          We recommend signing up for the patient portal called MyChart.  Sign up information is provided on this After Visit Summary.  MyChart is used to connect with patients for Virtual Visits (Telemedicine).  Patients are able to view lab/test results, encounter notes, upcoming appointments, etc.  Non-urgent messages can be sent to your provider as well.   To learn more about what you can do with MyChart, go to [email or web address].   Other Instructions

## 2024-03-26 NOTE — Assessment & Plan Note (Signed)
 Blood pressure controlled.   - Continue isosorbide  mononitrate 60 mg daily, lisinopril  2.5 mg daily, metoprolol  tartrate 25 mg twice daily.

## 2024-03-26 NOTE — Assessment & Plan Note (Signed)
 Continue rosuvastatin  40 mg daily.  Obtain fasting lipids today.

## 2024-03-27 ENCOUNTER — Ambulatory Visit: Payer: Self-pay | Admitting: Physician Assistant

## 2024-03-27 ENCOUNTER — Telehealth: Payer: Self-pay | Admitting: Podiatry

## 2024-03-27 LAB — LIPID PANEL
Chol/HDL Ratio: 2.6 ratio (ref 0.0–4.4)
Cholesterol, Total: 141 mg/dL (ref 100–199)
HDL: 55 mg/dL (ref >39)
LDL Chol Calc (NIH): 61 mg/dL (ref 0–99)
Triglycerides: 143 mg/dL (ref 0–149)
VLDL Cholesterol Cal: 25 mg/dL (ref 5–40)

## 2024-03-27 NOTE — Telephone Encounter (Signed)
 Patient called requesting a prescription refill. She didn't know the name of the medication. She did mentioned it being the medication she was prescribed for her surgery.

## 2024-03-28 ENCOUNTER — Other Ambulatory Visit: Payer: Self-pay | Admitting: Podiatry

## 2024-03-28 MED ORDER — DOXYCYCLINE HYCLATE 100 MG PO TABS: 100.0000 mg | ORAL_TABLET | Freq: Two times a day (BID) | ORAL | 0 refills | Status: DC

## 2024-03-28 MED ORDER — PREGABALIN 75 MG PO CAPS: 75.0000 mg | ORAL_CAPSULE | Freq: Two times a day (BID) | ORAL | 2 refills | Status: DC

## 2024-03-28 NOTE — Telephone Encounter (Signed)
 Patient's appointment isn't until November 3rd. Should I try to schedule her to be seen sooner?

## 2024-04-12 ENCOUNTER — Ambulatory Visit (INDEPENDENT_AMBULATORY_CARE_PROVIDER_SITE_OTHER)

## 2024-04-12 ENCOUNTER — Ambulatory Visit: Admitting: Podiatry

## 2024-04-12 VITALS — Ht 66.0 in | Wt 238.9 lb

## 2024-04-12 DIAGNOSIS — M869 Osteomyelitis, unspecified: Secondary | ICD-10-CM | POA: Diagnosis not present

## 2024-04-12 DIAGNOSIS — L97512 Non-pressure chronic ulcer of other part of right foot with fat layer exposed: Secondary | ICD-10-CM

## 2024-04-12 DIAGNOSIS — M79674 Pain in right toe(s): Secondary | ICD-10-CM

## 2024-04-12 NOTE — Progress Notes (Signed)
 Subjective: Chief Complaint  Patient presents with   Foot Problem    Rm 11 Patient is here for a swollen right 2nd toe with a dark discoloration. Symptom have been present for two weeks. Pt states some throbbing pain in the right index toe. There is an open wound on the right 2nd toe.    63 year old female presents the office today for concerns of a right second toe red, swollen at the tip of the toe.  She is at the toes also been sore.  Antibiotics are previously prescribed and she states that she just picked them up yesterday.  She does not report any fevers/chills. No other treatment. No injuries.  Objective: VSS  AAO x3, NAD DP/PT pulses palpable bilaterally, CRT less than 3 seconds Left foot: Incisions well-healed and there is no signs of infection.  There is no ulcerations or any areas of skin breakdown today. Right foot: The distal aspect the right second toe localized edema and erythema present.  Thick hyperkeratotic lesion the distal portion of the second toe and upon debridement there is some purulence noted underneath the callus.  After debridement able to visualize the wound which is pictured below.  Not able to measure the wound prior to debridement.  We measured 0.5 x 0.3 x 0.3 cm.  There is no probing to bone, undermining or tunneling.  There is no fluctuation or crepitation otherwise. No pain with calf compression, swelling, warmth, erythema    Assessment: Right second toe ulcer; infection  Plan: Radiology: X-rays obtained reviewed of the right foot.  Likely subacute to chronic changes present along the distal phalanx of the second toe but there are changes compared to prior x-rays consistent with likely osteomyelitis.  Right foot concern for osteomyelitis, cellulitis - She recent just picked up antibiotics yesterday and recommended her to start taking these.  Unfortunately we discussed possible need for at least partial amputation of the toe. -I like to see her back in  about 10 days and repeat the x-rays.  There is any worsening she is to report to the emergency room. -Wound culture obtained of the purulence today. -Monitor for any clinical signs or symptoms of infection and directed to call the office immediately should any occur or go to the ER.  Right second toe ulceration Procedure: Excisional Debridement of Wound Tool: Sharp #312 chisel blade/tissue nipper Type of Debridement: Sharp Excisional Frequency: @Once  weekly until appropriately healed.  Dressing is to be changed daily/keeping the wound clean and dry Rationale: Removal of non-viable soft tissue from the wound to promote healing.  Anesthesia: none Pre-Debridement Wound Measurements: Not able to measure wound prior to debridement given callus Post-Debridement Wound Measurements: 0.5 cm x 0.3 cm x 0.3 cm  Area devitalized tissue removed(nonviable tissue only): 0.5 cm x 0.3 cm.  Blood loss: Minimal (<50cc) Depth of Debridement: with fat layer exposed Description of tissue removed: Purulence Technique: The wound and the surrounding skin were prepped and draped in usual aseptic fashion.  Aseptic technique was maintained throughout the procedure.  Using #312 blade/tissue nipper sharp debridement of necrotic/nonviable tissue was performed until healthy bleeding wound bed was achieved.  No underlying bone or tendon was exposed during debridement.  The wound was thoroughly irrigated with normal saline solution Wound Progress:  Current Wound Volume: Debridement was performed of the chronic nonhealing diabetic foot wound on right foot Toe right, 2nd toe on the right.  Debridement removed 0.5 cm x 0.3 cm of the necrotic tissue and subcutaneous tissue and  moderate amount of purulent drainage was present. Presence/absence of tissue: Necrotic tissue/nonviable tissue present at the base of the wound.  Sharp debridement was performed to remove the necrotic tissue/nonviable tissue back to viable tissue.  No  devitalized/nonviable tissue present postdebridement.  Wound appeared clean and clear of infection No material in the wound was present that was identified to be inhibiting healing. Dressing: Antibiotic ointment was applied followed by a dry, sterile, compression dressing. Disposition: Patient tolerated procedure well. Patient to return in 1 week for follow-up or as listed above.  [name] DPM

## 2024-04-15 ENCOUNTER — Ambulatory Visit: Payer: Self-pay | Admitting: Podiatry

## 2024-04-15 ENCOUNTER — Telehealth: Payer: Self-pay | Admitting: Lab

## 2024-04-15 ENCOUNTER — Other Ambulatory Visit: Payer: Self-pay | Admitting: Podiatry

## 2024-04-15 LAB — WOUND CULTURE
MICRO NUMBER:: 17114472
RESULT:: NO GROWTH
SPECIMEN QUALITY:: ADEQUATE

## 2024-04-15 MED ORDER — DOXYCYCLINE HYCLATE 100 MG PO TABS: 100.0000 mg | ORAL_TABLET | Freq: Two times a day (BID) | ORAL | 0 refills | Status: DC

## 2024-04-15 NOTE — Telephone Encounter (Signed)
 Patient was calling about antibiotic ointment not at pharmacy please advise.

## 2024-04-22 ENCOUNTER — Ambulatory Visit (INDEPENDENT_AMBULATORY_CARE_PROVIDER_SITE_OTHER)

## 2024-04-22 ENCOUNTER — Ambulatory Visit (INDEPENDENT_AMBULATORY_CARE_PROVIDER_SITE_OTHER): Admitting: Podiatry

## 2024-04-22 ENCOUNTER — Encounter: Payer: Self-pay | Admitting: Podiatry

## 2024-04-22 ENCOUNTER — Telehealth: Payer: Self-pay | Admitting: Podiatry

## 2024-04-22 VITALS — Ht 66.0 in | Wt 238.0 lb

## 2024-04-22 DIAGNOSIS — M869 Osteomyelitis, unspecified: Secondary | ICD-10-CM

## 2024-04-22 NOTE — Patient Instructions (Signed)
 Pre-Operative Instructions    Plan to be at the surgery center/hospital at least 1 (one) hour prior to your scheduled time unless otherwise directed by the surgical center/hospital staff.  You must have a responsible adult accompany you, remain during the surgery and drive you home.  Make sure you have directions to the surgical center/hospital and know how to get there on time. For hospital based surgery you will need to obtain a history and physical form from your family physician within 1 month prior to the date of surgery- we will give you a form for you primary physician.  We make every effort to accommodate the date you request for surgery.  There are however, times where surgery dates or times have to be moved.  We will contact you as soon as possible if a change in schedule is required.   No Aspirin/Ibuprofen  for one week before surgery.  If you are on aspirin, any non-steroidal anti-inflammatory medications (Mobic, Aleve , Ibuprofen ) you should stop taking it 7 days prior to your surgery.  You make take Tylenol   For pain prior to surgery.  Medications- If you are taking daily heart and blood pressure medications, seizure, reflux, allergy, asthma, anxiety, pain or diabetes medications, make sure the surgery center/hospital is aware before the day of surgery so they may notify you which medications to take or avoid the day of surgery. No food or drink after midnight the night before surgery unless directed otherwise by surgical center/hospital staff. No alcoholic beverages 24 hours prior to surgery.  No smoking 24 hours prior to or 24 hours after surgery. Wear loose pants or shorts- loose enough to fit over bandages, boots, and casts. No slip on shoes, sneakers are best. Bring your boot with you to the surgery center/hospital.  Also bring crutches or a walker if your physician has prescribed it for you.  If you do not have this equipment, it will be provided for you after surgery. If you have not  been contracted by the surgery center/hospital by the day before your surgery, call to confirm the date and time of your surgery. Leave-time from work may vary depending on the type of surgery you have.  Appropriate arrangements should be made prior to surgery with your employer. Prescriptions will be provided immediately following surgery by your doctor.  Have these filled as soon as possible after surgery and take the medication as directed. Remove nail polish on the operative foot. Wash the night before surgery.  The night before surgery wash the foot and leg well with the antibacterial soap provided and water paying special attention to beneath the toenails and in between the toes.  Rinse thoroughly with water and dry well with a towel.  Perform this wash unless told not to do so by your physician.  Enclosed: 1 Ice pack (please put in freezer the night before surgery)   1 Hibiclens  skin cleaner   Pre-op Instructions  If you have any questions regarding the instructions, do not hesitate to call our office at any point during this process.   [place]: [address] [place], [place] [address] [phone]  [place]: [address], [place], [place] [address] [phone]  Dr. Bobbie Burows, DPM

## 2024-04-22 NOTE — Progress Notes (Signed)
 Subjective:  Patient ID: [name], female    DOB: 1961/04/18,  MRN: [id number]  Chief Complaint  Patient presents with   Foot Ulcer    Patient is here for Osteomyelitis of second toe of right foot [facility]), patient states toe is sore and boot is too big. Dark in color healing slow    Discussed the use of AI scribe software for clinical note transcription with the patient, who gave verbal consent to proceed.  History of Present Illness [name] is a 63 year old female who presents for follow-up of her right second toe condition.  She experiences no pain in her right second toe at this time and overall feeling better.  She is taking doxycycline , with seven doses remaining. A previous culture showed no growth.  She has no fevers or chills. Her A1c level has increased by one point, with a follow-up appointment scheduled for tomorrow.      Objective:  There were no vitals filed for this visit.  Physical Exam General: AAO x3, NAD  Dermatological: Persistence ulceration of the right second toe.  This ongoing edema and darkened discoloration of the distal aspect the right second toe.  There is no ascending cellulitis.  There is no fluctuation or crepitation.  Vascular: Dorsalis Pedis artery and Posterior Tibial artery pedal pulses are palpable bilateral with immedate capillary fill time. There is no pain with calf compression, swelling, warmth, erythema.   Neruologic: Sensation decreased with [employer] monofilament  Musculoskeletal: Digital contractures present.         Results LABS Culture: No organism growth A1c: 9  RADIOLOGY Right foot X-ray: Contracture present but there is cortical changes compared to old x-rays of the distal phalanx of the second toe concerning for osteomyelitis.    Assessment:   1. Osteomyelitis of second toe of right foot (HCC)      Plan:  Patient was evaluated and treated and all questions answered.  Assessment and  Plan Assessment & Plan Osteomyelitis of right second toe Suspected due to persistent infection. X-ray stable. Infection likely contributes Culture negative. On doxycycline . - Continue doxycycline  as prescribed.  Planned partial amputation of right second toe Partial amputation planned to address infection and prevent complications. Toe structure contributes to pressure and callus formation. Risks include infection, delayed healing, non-healing, further surgery or amputation. - Schedule partial amputation of the right second toe for Wednesday. - The incision placement as well as the postoperative course was discussed with the patient. I discussed risks of the surgery which include, but not limited to, infection, bleeding, pain, swelling, need for further surgery, delayed or nonhealing, painful or ugly scar, numbness or sensation changes, over/under correction, recurrence, transfer lesions, further deformity, DVT/PE, loss of toe/foot. Patient understands these risks and wishes to proceed with surgery. The surgical consent was reviewed with the patient all 3 pages were signed. No promises or guarantees were given to the outcome of the procedure. All questions were answered to the best of my ability. Before the surgery the patient was encouraged to call the office if there is any further questions. The surgery will be performed at the [facility] on an outpatient basis.  Type 2 diabetes mellitus with peripheral neuropathy Contributes to lack of sensation and pressure issues on right second toe. Elevated A1c at 9 may impede healing. Advised to manage blood sugar to aid healing and prevent complications. - Encourage blood sugar management to lower A1c.   No follow-ups on file.   Donnice JONELLE Fees  DPM

## 2024-04-22 NOTE — Telephone Encounter (Signed)
 DOS- 04/24/2024  2ND AMPUTATION TOE INTERPHALANGEAL RT- 71174  [facility] EFFECTIVE DATE- 01/26/2024  DEDUCTIBLE- $257 REMAINING- $0 OOP- $9350 REMAINING- $0955.55 COINSURANCE- 20%  PER [employer] PORTAL, PRIOR AUTH IS NOT REQUIRED FOR CPT CODE 71174. DECISION ID# [id number]

## 2024-04-23 ENCOUNTER — Telehealth: Payer: Self-pay | Admitting: Podiatry

## 2024-04-23 ENCOUNTER — Ambulatory Visit: Admitting: Internal Medicine

## 2024-04-23 NOTE — Progress Notes (Deleted)
 Name: [name]  Age/ Sex: 63 y.o., female   MRN/ DOB: [id number], July 17, 1960     PCP: Leonarda Roxan JAYSON, NP   Reason for Endocrinology Evaluation: Type 2 Diabetes Mellitus  Initial Endocrine Consultative Visit: 10/07/2019    PATIENT IDENTIFIER: [name] is a 63 y.o. female with a past medical history of HTn and T2DM. The patient has followed with Endocrinology clinic since 10/07/2019 for consultative assistance with management of her diabetes.      DIABETIC HISTORY:  [name] was diagnosed with gestational diabetes in 1984, requiring insulin  , she was off insulin  until her next pregnancy in 1985 and has been on insulin  ever since. Her hemoglobin A1c has ranged from 8.1% in 2016, peaking at 12.5% in 2020.   On her initial visit to our clinic, she had an A1c of 9.6%. She was on MDI regimen as well as victoza  and metformin , which were continued and added jardiance .   Developed yeast infections to Jardiance  12/2019  Switched victoza  to trulicity  12/2019  Switched to ozempic  2024  Metformin  discontinued 2024 due to low GFR    She was trained on the OmniPod in May, 2025  SUBJECTIVE:   During the last visit (02/27/2024): A1c 9.0%    Today (04/23/2024): Ms. [name] is here for a follow up on diabetes management.  She checks her blood sugars multiple times daily through CGM. She has not hypoglycemic episodes since her last visit.    The patient continues to follow-up with podiatry for history of left second toe amputation and right second toe osteomyelitis  Patient follows with cardiology for CHF, CAD, HTN, and dyslipidemia  Denies nausea, vomiting  Denies diarrhea but has occasional constipation   This patient with type 2 diabetes is treated with omnipod (insulin  pump). During the visit the pump basal and bolus doses were reviewed including carb/insulin  rations and supplemental doses. The clinical list was updated. The glucose meter download was reviewed in  detail to determine if the current pump settings are providing the best glycemic control without excessive hypoglycemia.      Pump   OmniPod Settings   Insulin  type   Humalog     Basal rate          0000 1.25 u/h                        I:C ratio          0000  1:1      6 g with meals , 3g with snacks                       Sensitivity          0000  30         Goal          0000  110           Type & Model of Pump: OmniPod Insulin  Type: Currently using NovoLog .  There is no height or weight on file to calculate BMI.  PUMP STATISTICS: Average BG: n/a  Average Daily Carbs (g): 4  Average Total Daily Insulin : 15.9  Average Daily Basal: 9.2 (58 %) Average Daily Bolus: 6.7 (42 %)     HOME DIABETES REGIMEN:  Jardiance  25 mg daily  Ozempic  2 mg weekly      Statin: yes ACE-I/ARB: yes     CONTINUOUS GLUCOSE MONITORING RECORD INTERPRETATION    Dates of Recording:  8/20-02/27/2024  Sensor description:dexcom  Results statistics:   CGM use % of time 89  Average and SD 170/57  Time in range 55%  % Time Above 180 35  % Time above 250 9  % Time Below target 1     Glycemic patterns summary: BGs are optimal overnight and fluctuate during the day  Hyperglycemic episodes  postprandial   Hypoglycemic episodes occurred N/A  Overnight periods: Mostly optimal    Microvascular complications:  Neuropathy, hx left 3rd toe and left first toe amputations, retinopathy  Denies: CKD  Last eye exam: Completed 06/2019   Macrovascular complications:  CAD, CHF Denies:  PVD, CVA       HISTORY:  Past Medical History:  Past Medical History:  Diagnosis Date   Adhesive capsulitis of shoulder    bilateral, Steroid injection Dr. Jama 1/12 bilaterally   CAD (coronary artery disease)    nonobstructive. Last cardiac cath (2008) showing left circumflex with mid 50% stenosis and distal luminal irregularities. Also with RCA with mid to distal 30-40% stenosis. //  Previously [facility] by [facility], never followed up outpatient.   CAP (community acquired pneumonia) 06/15/2012   05/2012 CXR: Mild opacification of the posterior lung base on the lateral film  as cannot exclude infection/atelectasis.     CHF (congestive heart failure) (HCC)    Diabetes mellitus 2007   Type II, insulin  dependent   Diabetic retinopathy    GERD (gastroesophageal reflux disease)    Glaucoma    Hyperlipidemia    Hypertension    Obesity    Palpitations    Monitor 10/2023: NSR, average heart rate 87, PVCs/PACs <1%, no sustained arrhythmias/A-fib/high-grade heart block   Peripheral neuropathy    Past Surgical History:  Past Surgical History:  Procedure Laterality Date   AMPUTATION TOE Left 03/2023   3rd left toe   AMPUTATION TOE Left 07/21/2023   Procedure: AMPUTATION LEFT GREAT TOE;  Surgeon: Malvin Marsa FALCON, DPM;  Location: [facility] OR;  Service: Orthopedics/Podiatry;  Laterality: Left;   ANTERIOR CERVICAL DECOMP/DISCECTOMY FUSION N/A 06/16/2016   Procedure: Anterior Cervical Decompression/discectomy Fusion - Cervical six - Cervical seven;  Surgeon: Alm GORMAN Molt, MD;  Location: [facility] OR;  Service: Neurosurgery;  Laterality: N/A;  Anterior Cervical Decompression/discectomy Fusion - Cervical six - Cervical seven   APPENDECTOMY     CARDIAC CATHETERIZATION     CATARACT EXTRACTION     GLAUCOMA SURGERY     LEFT HEART CATH AND CORONARY ANGIOGRAPHY N/A 05/22/2018   Procedure: LEFT HEART CATH AND CORONARY ANGIOGRAPHY;  Surgeon: Burnard Debby LABOR, MD;  Location: [facility] INVASIVE CV LAB;  Service: Cardiovascular;  Laterality: N/A;   LEFT HEART CATHETERIZATION WITH CORONARY ANGIOGRAM N/A 01/24/2012   Procedure: LEFT HEART CATHETERIZATION WITH CORONARY ANGIOGRAM;  Surgeon: Ozell Fell, MD;  Location[facility] CATH LAB;  Service: Cardiovascular;  Laterality: N/A;   POSTERIOR CERVICAL FUSION/FORAMINOTOMY N/A 10/22/2015   Procedure: Cervical three-Cervical Seven Posterior cervical fusion  with lateral mass fixation,  Laminectomy Cervical Three-Cervical Seven;  Surgeon: Alm GORMAN Molt, MD;  Location: [facility] NEURO ORS;  Service: Neurosurgery;  Laterality: N/A;  Posterior   TUBAL LIGATION     Social History:  reports that she quit smoking about 17 years ago. Her smoking use included cigarettes. She started smoking about 24 years ago. She has a 2.1 pack-year smoking history. She has never used smokeless tobacco. She reports that she does not drink alcohol and does not use drugs. Family History:  Family History  Problem Relation Age  of Onset   Heart disease Mother    Hypertension Sister    Hypertension Sister    Hypertension Sister    Heart attack Neg Hx    Stroke Neg Hx      HOME MEDICATIONS: Allergies as of 04/23/2024   No Known Allergies      Medication List        Accurate as of April 23, 2024  7:16 AM. If you have any questions, ask your nurse or doctor.          amitriptyline  25 MG tablet Commonly known as: ELAVIL  Take 1 tablet (25 mg total) by mouth at bedtime.   ammonium lactate  12 % cream Commonly known as: AMLACTIN Apply 1 Application topically as needed for dry skin.   clopidogrel  75 MG tablet Commonly known as: PLAVIX  Take 1 tablet (75 mg total) by mouth daily.   Combigan 0.2-0.5 % ophthalmic solution Generic drug: brimonidine-timolol  Place 1 drop into both eyes 2 (two) times daily.   Comfort EZ Pen Needles 31G X 8 MM Misc Generic drug: Insulin  Pen Needle USE AS DIRECTED 4 TIMES DAILY   cyclobenzaprine  10 MG tablet Commonly known as: FLEXERIL  Take 1 tablet (10 mg total) by mouth 2 (two) times daily as needed.   Dexcom G7 Sensor Misc Change sensor every 10 days   diclofenac  Sodium 1 % Gel Commonly known as: Voltaren  Apply 2 g topically 4 (four) times daily.   diphenhydrAMINE  25 MG tablet Commonly known as: BENADRYL  Take 1 tablet (25 mg total) by mouth every 6 (six) hours as needed for itching or allergies.   dorzolamide  2 %  ophthalmic solution Commonly known as: TRUSOPT  Place 1 drop into both eyes 2 (two) times daily.   doxycycline  100 MG tablet Commonly known as: VIBRA -TABS Take 1 tablet (100 mg total) by mouth 2 (two) times daily.   doxycycline  100 MG tablet Commonly known as: VIBRA -TABS Take 1 tablet (100 mg total) by mouth 2 (two) times daily.   empagliflozin  25 MG Tabs tablet Commonly known as: Jardiance  Take 1 tablet (25 mg total) by mouth daily.   furosemide  20 MG tablet Commonly known as: LASIX  Take 20 mg by mouth 2 (two) times daily.   Guaifenesin  1200 MG Tb12 Commonly known as: Mucinex  Maximum Strength Take 1 tablet (1,200 mg total) by mouth every 12 (twelve) hours as needed.   insulin  lispro 100 UNIT/ML injection Commonly known as: HumaLOG  Max daily 60 units   isosorbide  mononitrate 60 MG 24 hr tablet Commonly known as: IMDUR  Take 1 tablet (60 mg total) by mouth daily.   lisinopril  2.5 MG tablet Commonly known as: ZESTRIL  Take 1 tablet (2.5 mg total) by mouth daily.   metoprolol  tartrate 25 MG tablet Commonly known as: LOPRESSOR  TAKE 1 TABLET BY MOUTH IN THE AM, TAKE 1 TABLET BY MOUTH IN THE EVENING   montelukast  10 MG tablet Commonly known as: SINGULAIR  Take 1 tablet (10 mg total) by mouth at bedtime as needed.   nitroGLYCERIN  0.4 MG SL tablet Commonly known as: NITROSTAT  DISSOLVE 1 TABLET (0.4 MG TOTAL) UNDER THE TONGUE EVERY 5 MINUTES AS NEEDED FOR CHEST PAIN.   Omnipod 5 G7 Pods (Gen 5) Misc 1 Device by Does not apply route every other day.   OneTouch Delica Lancets Fine Misc Check blood sugar as instructed up to 3 times a day   TRUEplus Lancets 33G Misc Check 4 times a daily   Ozempic  (2 MG/DOSE) 8 MG/3ML Sopn Generic drug: Semaglutide  (2 MG/DOSE) 2  mg by Other route once a week.   pantoprazole  40 MG tablet Commonly known as: PROTONIX  Take 1 tablet (40 mg total) by mouth daily.   Pharmacist Choice Alcohol Pads Clean area before testing or injecting 4  times daily   pregabalin  75 MG capsule Commonly known as: Lyrica  Take 1 capsule (75 mg total) by mouth 2 (two) times daily.   ranolazine  500 MG 12 hr tablet Commonly known as: Ranexa  Take 1 tablet (500 mg total) by mouth 2 (two) times daily.   Rocklatan  0.02-0.005 % Soln Generic drug: Netarsudil -Latanoprost  Place 1 drop into both eyes at bedtime.   rosuvastatin  40 MG tablet Commonly known as: CRESTOR  TAKE 1 TABLET(40 MG) BY MOUTH DAILY         OBJECTIVE:   Vital Signs: LMP 02/13/2011   Wt Readings from Last 3 Encounters:  04/22/24 238 lb (108 kg)  04/12/24 238 lb 14.4 oz (108.4 kg)  03/26/24 238 lb 14.4 oz (108.4 kg)     Exam: General: Pt appears well and is in NAD  Lungs: CTA  Heart: RRR  Extremities: No edema  Neuro: MS is good with appropriate affect, pt is alert and Ox3     DM foot exam: per podiatry 03/04/2024    DATA REVIEWED:  Lab Results  Component Value Date   HGBA1C 9.0 (A) 02/27/2024   HGBA1C 8.7 (A) 10/10/2023   HGBA1C 10.2 (A) 07/12/2023    Latest Reference Range & Units 11/30/23 11:28  Sodium 135 - 146 mmol/L 138  Potassium 3.5 - 5.3 mmol/L 4.3  Chloride 98 - 110 mmol/L 103  CO2 20 - 32 mmol/L 25  Glucose 65 - 99 mg/dL 860 (H)  BUN 7 - 25 mg/dL 30 (H)  Creatinine -  9.49 - 1.05 mg/dL CANCELED 8.71 (H)  Calcium  8.6 - 10.4 mg/dL 9.6  BUN/Creatinine Ratio 6 - 22 (calc) 23 (H)  AG Ratio 1.0 - 2.5 (calc) 1.3  AST 10 - 35 U/L 13  ALT 6 - 29 U/L 13  Total Protein 6.1 - 8.1 g/dL 7.5  Total Bilirubin 0.2 - 1.2 mg/dL 0.4     ASSESSMENT / PLAN / RECOMMENDATIONS:   1) Type 2 Diabetes Mellitus, Poorly controlled, With retinopathic,  neuropathic, and macrovascular  complications complications - Most recent A1c of 9.0%. Goal A1c < 7.0 %.     -Patient continues with poorly controlled diabetes -She has been using OmniPod since May, 2025 but she is having difficulty with obtaining Dexcom sensors, she is also having multiple faulty sensors, she  was provided with number to Dexcom sensors -Patient was encouraged to calibrate the Dexcom with new sensors -She did not have her PDM today, unable to make changes -Metformin  discontinued due to low GFR - Patient has not been bolusing for meals just for correction, I encouraged the patient to enter 6 g of carbohydrate with meals from now on  MEDICATIONS: - Continue  Jardiance   25 mg daily - Continue Ozempic  2 mg weekly    Pump   OmniPod Settings   Insulin  type   Humalog    Basal rate       0000 1.25 u/h               I:C ratio       0000  1:1    6 g with meals , 3g with snacks              Sensitivity       0000  30  Goal       0000  110        EDUCATION / INSTRUCTIONS: BG monitoring instructions: Patient is instructed to check her blood sugars 4 times a day, before meals and bedtime  Call [facility] if: BG persistently < 70  I reviewed the Rule of 15 for the treatment of hypoglycemia in detail with the patient. Literature supplied.    2) Diabetic complications:  Eye: Does have known diabetic retinopathy.  Neuro/ Feet: Does have known diabetic peripheral neuropathy .  Renal: Patient does not have known baseline CKD. She   is on an ACEI/ARB at present.      F/U in 2 months    Signed electronically by: Stefano Redgie Butts, MD  [facility]  [facility] [address] [place] [place], [place] [address] Phone: [phone] FAX: [phone]   CC: Leonarda Roxan BROCKS, NP [address] [place] [address] Phone: [phone]  Fax: [phone]  Return to Endocrinology clinic as below: Future Appointments  Date Time Provider Department Center  04/23/2024  9:30 AM Onaje Warne, Donell Redgie, MD [facility] None  04/29/2024  9:45 AM Gershon Donnice SAUNDERS, DPM [facility]  04/29/2024 10:00 AM [facility] NURSE 2 [facility]  05/13/2024  9:00 AM [facility] NURSE 2 [facility]  05/27/2024   2:45 PM Gershon Donnice SAUNDERS, DPM [facility]  05/31/2024  9:00 AM Ngetich, Roxan BROCKS, NP [facility] None

## 2024-04-23 NOTE — Telephone Encounter (Signed)
 Called patient as I received an email from Cynthia at [facility]. Patient last Ozempic  dose was 04/20/2024. Per [facility] they are unable to proceed under anesthesia per their policy. Patient surgery is being rescheduled to 05/01/2024.

## 2024-04-29 ENCOUNTER — Encounter

## 2024-04-29 ENCOUNTER — Ambulatory Visit: Admitting: Podiatry

## 2024-05-01 ENCOUNTER — Telehealth: Payer: Self-pay | Admitting: Lab

## 2024-05-01 ENCOUNTER — Other Ambulatory Visit: Payer: Self-pay | Admitting: Podiatry

## 2024-05-01 DIAGNOSIS — M86671 Other chronic osteomyelitis, right ankle and foot: Secondary | ICD-10-CM | POA: Diagnosis not present

## 2024-05-01 MED ORDER — ONDANSETRON HCL 4 MG PO TABS: 4.0000 mg | ORAL_TABLET | Freq: Three times a day (TID) | ORAL | 0 refills | Status: AC | PRN

## 2024-05-01 MED ORDER — DOXYCYCLINE HYCLATE 100 MG PO TABS: 100.0000 mg | ORAL_TABLET | Freq: Two times a day (BID) | ORAL | 0 refills | Status: AC

## 2024-05-01 MED ORDER — HYDROCODONE-ACETAMINOPHEN 5-325 MG PO TABS: 1.0000 | ORAL_TABLET | Freq: Four times a day (QID) | ORAL | 0 refills | Status: DC | PRN

## 2024-05-01 NOTE — Telephone Encounter (Signed)
 Pharmacy calling wanting you to be aware that patient filled a stronger pain medication on 04/11/2024 quantity 90 do you still want them to fill today's order?

## 2024-05-06 ENCOUNTER — Ambulatory Visit

## 2024-05-06 ENCOUNTER — Encounter: Payer: Self-pay | Admitting: Podiatry

## 2024-05-06 ENCOUNTER — Ambulatory Visit (INDEPENDENT_AMBULATORY_CARE_PROVIDER_SITE_OTHER): Admitting: Podiatry

## 2024-05-06 DIAGNOSIS — M869 Osteomyelitis, unspecified: Secondary | ICD-10-CM | POA: Diagnosis not present

## 2024-05-06 DIAGNOSIS — M2041 Other hammer toe(s) (acquired), right foot: Secondary | ICD-10-CM

## 2024-05-06 DIAGNOSIS — M2042 Other hammer toe(s) (acquired), left foot: Secondary | ICD-10-CM

## 2024-05-07 NOTE — Progress Notes (Signed)
 Subjective: Chief Complaint  Patient presents with   Routine Post Op    Rm12 POV 1 DOS 05/01/24 Rt 2nd  Amputation toe  interphalangeal/ pt says she is doing well.   63 year old female presents the office today for follow-up evaluation status post right second toe potation.  She states that she is doing well she is not having any issues.  Pain is controlled.  Denies any fevers or chills.  Objective: AAO x3, NAD DP/PT pulses palpable bilaterally, CRT less than 3 seconds Status post right second toe amputation.  Incision appears to be healing well without any evidence of dehiscence and incisions intact.  There is minimal edema.  No erythema or warmth.  No drainage or pus or any signs of infection. Hammertoe deformities noted without any further ulcerations at this time. No pain with calf compression, swelling, warmth, erythema  Assessment: 63 year old female status post right second toe amputation; hammertoes  Plan: -All treatment options discussed with the patient including all alternatives, risks, complications.  -X-rays obtained reviewed.  Multiple views obtained.  No evidence of acute fracture.  No complicating factors.  Hammertoe contractures present. -Xeroform was applied followed by dressing.  She needs a dressing clean, dry, intact if she would like she can start to change the dressing and apply some of dressing.  Continue surgical shoe, elevation and limited weightbearing. -She is to monitor the hammertoe particular the right third toe to make sure no further ulceration or callus formation were to occur to help event further infection or amputation. -Patient encouraged to call the office with any questions, concerns, change in symptoms.   [name] DPM

## 2024-05-08 ENCOUNTER — Other Ambulatory Visit

## 2024-05-08 ENCOUNTER — Encounter: Payer: Self-pay | Admitting: Internal Medicine

## 2024-05-08 ENCOUNTER — Ambulatory Visit (INDEPENDENT_AMBULATORY_CARE_PROVIDER_SITE_OTHER): Admitting: Internal Medicine

## 2024-05-08 VITALS — BP 104/50 | Ht 66.0 in | Wt 228.0 lb

## 2024-05-08 DIAGNOSIS — E11319 Type 2 diabetes mellitus with unspecified diabetic retinopathy without macular edema: Secondary | ICD-10-CM

## 2024-05-08 DIAGNOSIS — Z794 Long term (current) use of insulin: Secondary | ICD-10-CM

## 2024-05-08 DIAGNOSIS — E1142 Type 2 diabetes mellitus with diabetic polyneuropathy: Secondary | ICD-10-CM | POA: Diagnosis not present

## 2024-05-08 DIAGNOSIS — N1831 Chronic kidney disease, stage 3a: Secondary | ICD-10-CM

## 2024-05-08 DIAGNOSIS — E1122 Type 2 diabetes mellitus with diabetic chronic kidney disease: Secondary | ICD-10-CM | POA: Diagnosis not present

## 2024-05-08 DIAGNOSIS — E1159 Type 2 diabetes mellitus with other circulatory complications: Secondary | ICD-10-CM

## 2024-05-08 LAB — POCT GLYCOSYLATED HEMOGLOBIN (HGB A1C): Hemoglobin A1C: 8.4 % — AB (ref 4.0–5.6)

## 2024-05-08 MED ORDER — OMNIPOD 5 G7 PODS (GEN 5) MISC: 1.0000 | 3 refills | Status: AC

## 2024-05-08 MED ORDER — INSULIN LISPRO 100 UNIT/ML IJ SOLN: INTRAMUSCULAR | 3 refills | Status: AC

## 2024-05-08 MED ORDER — DEXCOM G7 SENSOR MISC: 3 refills | Status: AC

## 2024-05-08 MED ORDER — EMPAGLIFLOZIN 25 MG PO TABS: 25.0000 mg | ORAL_TABLET | Freq: Every day | ORAL | 3 refills | Status: AC

## 2024-05-08 MED ORDER — OZEMPIC (2 MG/DOSE) 8 MG/3ML ~~LOC~~ SOPN: 2.0000 mg | PEN_INJECTOR | SUBCUTANEOUS | 3 refills | Status: AC

## 2024-05-08 NOTE — Progress Notes (Signed)
 Name: [name]  Age/ Sex: 63 y.o., female   MRN/ DOB: [id number], 28-May-1961     PCP: Carrie Roxan JAYSON, NP   Reason for Endocrinology Evaluation: Type 2 Diabetes Mellitus  Initial Endocrine Consultative Visit: 10/07/2019    PATIENT IDENTIFIER: [name] is a 63 y.o. female with a past medical history of HTn and T2DM. The patient has followed with Endocrinology clinic since 10/07/2019 for consultative assistance with management of her diabetes.      DIABETIC HISTORY:  [name] was diagnosed with gestational diabetes in 1984, requiring insulin  , she was off insulin  until her next pregnancy in 1985 and has been on insulin  ever since. Her hemoglobin A1c has ranged from 8.1% in 2016, peaking at 12.5% in 2020.   On her initial visit to our clinic, she had an A1c of 9.6%. She was on MDI regimen as well as victoza  and metformin , which were continued and added jardiance .   Developed yeast infections to Jardiance  12/2019  Switched victoza  to trulicity  12/2019  Switched to ozempic  2024  Metformin  discontinued 2024 due to low GFR    She was trained on the OmniPod in May, 2025  SUBJECTIVE:   During the last visit (02/27/2024): A1c 9.0%    Today (05/08/2024): [name] is here for a follow up on diabetes management.  She checks her blood sugars multiple times daily through CGM. She has not hypoglycemic episodes since her last visit.    The patient continues to follow-up with podiatry for history of left second toe amputation she underwent wide second toe amputation due to osteomyelitis 04/2024  Patient follows with cardiology for CHF, CAD, HTN, and dyslipidemia  Has occasional nausea but no vomiting  No constipation or diarrhea  No fever   This patient with type 2 diabetes is treated with omnipod (insulin  pump). During the visit the pump basal and bolus doses were reviewed including carb/insulin  rations and supplemental doses. The clinical list was updated. The  glucose meter download was reviewed in detail to determine if the current pump settings are providing the best glycemic control without excessive hypoglycemia.     Pump   OmniPod Settings   Insulin  type   Humalog    Basal rate       0000 1.25 u/h               I:C ratio       0000  1:1    6 g with meals , 3g with snacks              Sensitivity       0000  30      Goal       0000  110            Type & Model of Pump: OmniPod Insulin  Type: Currently using NovoLog .  Body mass index is 36.8 kg/m.  PUMP STATISTICS: Average BG: n/a Average Daily Carbs (g):7.1 Average Total Daily Insulin :22.3 Average Daily Basal: 14.3 (64 %) Average Daily Bolus: 8 (36%)     HOME DIABETES REGIMEN:  Jardiance  25 mg daily  Ozempic  2 mg weekly      Statin: yes ACE-I/ARB: yes     CONTINUOUS GLUCOSE MONITORING RECORD INTERPRETATION    Dates of Recording:  10/30-11/05/2024  Sensor description:dexcom  Results statistics:   CGM use % of time   Average and SD 234/37  Time in range 10%  % Time Above 180 60  % Time above 250 30  %  Time Below target 0     Glycemic patterns summary: BGs are optimal overnight and fluctuate during the day  Hyperglycemic episodes  postprandial   Hypoglycemic episodes occurred N/A  Overnight periods: Mostly optimal    Microvascular complications:  Neuropathy, hx left 3rd toe and left first toe amputations, retinopathy , Right 2nd toe amputation (04/2024) Denies: CKD  Last eye exam: Completed    Macrovascular complications:  CAD, CHF Denies:  PVD, CVA       HISTORY:  Past Medical History:  Past Medical History:  Diagnosis Date   Adhesive capsulitis of shoulder    bilateral, Steroid injection Dr. Jama 1/12 bilaterally   CAD (coronary artery disease)    nonobstructive. Last cardiac cath (2008) showing left circumflex with mid 50% stenosis and distal luminal irregularities. Also with RCA with mid to distal 30-40% stenosis. //  Previously evaluated by [facility], never followed up outpatient.   CAP (community acquired pneumonia) 06/15/2012   05/2012 CXR: Mild opacification of the posterior lung base on the lateral film  as cannot exclude infection/atelectasis.     CHF (congestive heart failure) (HCC)    Diabetes mellitus 2007   Type II, insulin  dependent   Diabetic retinopathy    GERD (gastroesophageal reflux disease)    Glaucoma    Hyperlipidemia    Hypertension    Obesity    Palpitations    Monitor 10/2023: NSR, average heart rate 87, PVCs/PACs <1%, no sustained arrhythmias/A-fib/high-grade heart block   Peripheral neuropathy    Past Surgical History:  Past Surgical History:  Procedure Laterality Date   AMPUTATION TOE Left 03/2023   3rd left toe   AMPUTATION TOE Left 07/21/2023   Procedure: AMPUTATION LEFT GREAT TOE;  Surgeon: Malvin Marsa FALCON, DPM;  Location: [facility] OR;  Service: Orthopedics/Podiatry;  Laterality: Left;   ANTERIOR CERVICAL DECOMP/DISCECTOMY FUSION N/A 06/16/2016   Procedure: Anterior Cervical Decompression/discectomy Fusion - Cervical six - Cervical seven;  Surgeon: Alm GORMAN Molt, MD;  Location: [facility] OR;  Service: Neurosurgery;  Laterality: N/A;  Anterior Cervical Decompression/discectomy Fusion - Cervical six - Cervical seven   APPENDECTOMY     CARDIAC CATHETERIZATION     CATARACT EXTRACTION     GLAUCOMA SURGERY     LEFT HEART CATH AND CORONARY ANGIOGRAPHY N/A 05/22/2018   Procedure: LEFT HEART CATH AND CORONARY ANGIOGRAPHY;  Surgeon: Burnard Debby LABOR, MD;  Location: [facility] INVASIVE CV LAB;  Service: Cardiovascular;  Laterality: N/A;   LEFT HEART CATHETERIZATION WITH CORONARY ANGIOGRAM N/A 01/24/2012   Procedure: LEFT HEART CATHETERIZATION WITH CORONARY ANGIOGRAM;  Surgeon: Ozell Fell, MD;  Location: [facility] CATH LAB;  Service: Cardiovascular;  Laterality: N/A;   POSTERIOR CERVICAL FUSION/FORAMINOTOMY N/A 10/22/2015   Procedure: Cervical three-Cervical Seven Posterior cervical fusion  with lateral mass fixation,  Laminectomy Cervical Three-Cervical Seven;  Surgeon: Alm GORMAN Molt, MD;  Location: [facility] NEURO ORS;  Service: Neurosurgery;  Laterality: N/A;  Posterior   TUBAL LIGATION     Social History:  reports that she quit smoking about 17 years ago. Her smoking use included cigarettes. She started smoking about 24 years ago. She has a 2.1 pack-year smoking history. She has never used smokeless tobacco. She reports that she does not drink alcohol and does not use drugs. Family History:  Family History  Problem Relation Age of Onset   Heart disease Mother    Hypertension Sister    Hypertension Sister    Hypertension Sister    Heart attack Neg Hx    Stroke Neg  Hx      HOME MEDICATIONS: Allergies as of 05/08/2024   No Known Allergies      Medication List        Accurate as of May 08, 2024 10:58 AM. If you have any questions, ask your nurse or doctor.          amitriptyline  25 MG tablet Commonly known as: ELAVIL  Take 1 tablet (25 mg total) by mouth at bedtime.   ammonium lactate  12 % cream Commonly known as: AMLACTIN Apply 1 Application topically as needed for dry skin.   clopidogrel  75 MG tablet Commonly known as: PLAVIX  Take 1 tablet (75 mg total) by mouth daily.   Combigan 0.2-0.5 % ophthalmic solution Generic drug: brimonidine-timolol  Place 1 drop into both eyes 2 (two) times daily.   Comfort EZ Pen Needles 31G X 8 MM Misc Generic drug: Insulin  Pen Needle USE AS DIRECTED 4 TIMES DAILY   cyclobenzaprine  10 MG tablet Commonly known as: FLEXERIL  Take 1 tablet (10 mg total) by mouth 2 (two) times daily as needed.   Dexcom G7 Sensor Misc Change sensor every 10 days   diclofenac  Sodium 1 % Gel Commonly known as: Voltaren  Apply 2 g topically 4 (four) times daily.   diphenhydrAMINE  25 MG tablet Commonly known as: BENADRYL  Take 1 tablet (25 mg total) by mouth every 6 (six) hours as needed for itching or allergies.   dorzolamide  2 %  ophthalmic solution Commonly known as: TRUSOPT  Place 1 drop into both eyes 2 (two) times daily.   doxycycline  100 MG tablet Commonly known as: VIBRA -TABS Take 1 tablet (100 mg total) by mouth 2 (two) times daily.   empagliflozin  25 MG Tabs tablet Commonly known as: Jardiance  Take 1 tablet (25 mg total) by mouth daily.   furosemide  20 MG tablet Commonly known as: LASIX  Take 20 mg by mouth 2 (two) times daily.   Guaifenesin  1200 MG Tb12 Commonly known as: Mucinex  Maximum Strength Take 1 tablet (1,200 mg total) by mouth every 12 (twelve) hours as needed.   HYDROcodone -acetaminophen  5-325 MG tablet Commonly known as: NORCO/VICODIN Take 1-2 tablets by mouth every 6 (six) hours as needed.   insulin  lispro 100 UNIT/ML injection Commonly known as: HumaLOG  Max daily 60 units   isosorbide  mononitrate 60 MG 24 hr tablet Commonly known as: IMDUR  Take 1 tablet (60 mg total) by mouth daily.   lisinopril  2.5 MG tablet Commonly known as: ZESTRIL  Take 1 tablet (2.5 mg total) by mouth daily.   metoprolol  tartrate 25 MG tablet Commonly known as: LOPRESSOR  TAKE 1 TABLET BY MOUTH IN THE AM, TAKE 1 TABLET BY MOUTH IN THE EVENING   montelukast  10 MG tablet Commonly known as: SINGULAIR  Take 1 tablet (10 mg total) by mouth at bedtime as needed.   nitroGLYCERIN  0.4 MG SL tablet Commonly known as: NITROSTAT  DISSOLVE 1 TABLET (0.4 MG TOTAL) UNDER THE TONGUE EVERY 5 MINUTES AS NEEDED FOR CHEST PAIN.   Omnipod 5 G7 Pods (Gen 5) Misc 1 Device by Does not apply route every other day.   ondansetron  4 MG tablet Commonly known as: Zofran  Take 1 tablet (4 mg total) by mouth every 8 (eight) hours as needed for nausea or vomiting.   OneTouch Delica Lancets Fine Misc Check blood sugar as instructed up to 3 times a day   TRUEplus Lancets 33G Misc Check 4 times a daily   Ozempic  (2 MG/DOSE) 8 MG/3ML Sopn Generic drug: Semaglutide  (2 MG/DOSE) 2 mg by Other route once a week.  pantoprazole  40  MG tablet Commonly known as: PROTONIX  Take 1 tablet (40 mg total) by mouth daily.   Pharmacist Choice Alcohol Pads Clean area before testing or injecting 4 times daily   pregabalin  75 MG capsule Commonly known as: Lyrica  Take 1 capsule (75 mg total) by mouth 2 (two) times daily.   ranolazine  500 MG 12 hr tablet Commonly known as: Ranexa  Take 1 tablet (500 mg total) by mouth 2 (two) times daily.   Rocklatan  0.02-0.005 % Soln Generic drug: Netarsudil -Latanoprost  Place 1 drop into both eyes at bedtime.   rosuvastatin  40 MG tablet Commonly known as: CRESTOR  TAKE 1 TABLET(40 MG) BY MOUTH DAILY         OBJECTIVE:   Vital Signs: BP (!) 104/50   Ht 5' 6 (1.676 m)   Wt 228 lb (103.4 kg)   LMP 02/13/2011   BMI 36.80 kg/m   Wt Readings from Last 3 Encounters:  05/08/24 228 lb (103.4 kg)  04/22/24 238 lb (108 kg)  04/12/24 238 lb 14.4 oz (108.4 kg)     Exam: General: Pt appears well and is in NAD  Lungs: CTA  Heart: RRR  Extremities: No edema Right foot boot in place  Neuro: MS is good with appropriate affect, pt is alert and Ox3     DM foot exam: per podiatry 03/04/2024    DATA REVIEWED:  Lab Results  Component Value Date   HGBA1C 8.4 (A) 05/08/2024   HGBA1C 9.0 (A) 02/27/2024   HGBA1C 8.7 (A) 10/10/2023    Latest Reference Range & Units 11/30/23 11:28  Sodium 135 - 146 mmol/L 138  Potassium 3.5 - 5.3 mmol/L 4.3  Chloride 98 - 110 mmol/L 103  CO2 20 - 32 mmol/L 25  Glucose 65 - 99 mg/dL 860 (H)  BUN 7 - 25 mg/dL 30 (H)  Creatinine -  9.49 - 1.05 mg/dL CANCELED 8.71 (H)  Calcium  8.6 - 10.4 mg/dL 9.6  BUN/Creatinine Ratio 6 - 22 (calc) 23 (H)  AG Ratio 1.0 - 2.5 (calc) 1.3  AST 10 - 35 U/L 13  ALT 6 - 29 U/L 13  Total Protein 6.1 - 8.1 g/dL 7.5  Total Bilirubin 0.2 - 1.2 mg/dL 0.4     ASSESSMENT / PLAN / RECOMMENDATIONS:   1) Type 2 Diabetes Mellitus, Poorly controlled, With retinopathic,  neuropathic, and macrovascular  complications  complications - Most recent A1c of 8.4%. Goal A1c < 7.0 %.     -A1c is trending down but continues to be above goal -Metformin  discontinued due to low GFR - She continues not to bolus on regular basis with meals/snacks, we discussed the importance of entering carbohydrates each time she eats a meal or a snack.  Will increase grams of carbohydrates as below -In reviewing CGM/pump download, her Dexcom continues not to link to her OmniPod, she is meeting with our CDE to help solve this issue    MEDICATIONS: - Continue  Jardiance   25 mg daily - Continue Ozempic  2 mg weekly    Pump   OmniPod Settings   Insulin  type   Humalog    Basal rate       0000 1.25 u/h               I:C ratio       0000  1:1    10 g with meals , 5g with snacks              Sensitivity  0000  30      Goal       0000  110        EDUCATION / INSTRUCTIONS: BG monitoring instructions: Patient is instructed to check her blood sugars 4 times a day, before meals and bedtime  Call [place] [facility] if: BG persistently < 70  I reviewed the Rule of 15 for the treatment of hypoglycemia in detail with the patient. Literature supplied.    2) Diabetic complications:  Eye: Does have known diabetic retinopathy.  Neuro/ Feet: Does have known diabetic peripheral neuropathy .  Renal: Patient does not have known baseline CKD. She   is on an ACEI/ARB at present.      F/U in 4 months    Signed electronically by: Stefano Redgie Butts, MD  [facility] End[facility]  [name] [address] [place] [place], [place] [address] Phone: [phone] FAX: [phone]   CC: Carrie Roxan BROCKS, NP [address] [place] [address] Phone: [phone]  Fax: [phone]  Return to Endocrinology clinic as below: Future Appointments  Date Time Provider Department Center  05/20/2024  8:30 AM [facility] NURSE 2 [facility]  05/31/2024  9:00 AM Ngetich, Roxan BROCKS, NP [facility]  [address]  06/03/2024  9:45 AM Gershon Donnice SAUNDERS, DPM [facility]

## 2024-05-08 NOTE — Patient Instructions (Addendum)
  Entered 10 g of carbohydrates with each meal and 5 g of carbohydrate with a snack    HOW TO TREAT LOW BLOOD SUGARS (Blood sugar LESS THAN 70 MG/DL) Please follow the RULE OF 15 for the treatment of hypoglycemia treatment (when your (blood sugars are less than 70 mg/dL)   STEP 1: Take 15 grams of carbohydrates when your blood sugar is low, which includes:  3-4 GLUCOSE TABS  OR 3-4 OZ OF JUICE OR REGULAR SODA OR ONE TUBE OF GLUCOSE GEL    STEP 2: RECHECK blood sugar in 15 MINUTES STEP 3: If your blood sugar is still low at the 15 minute recheck --> then, go back to STEP 1 and treat AGAIN with another 15 grams of carbohydrates.

## 2024-05-09 ENCOUNTER — Ambulatory Visit: Payer: Self-pay | Admitting: Podiatry

## 2024-05-13 ENCOUNTER — Encounter

## 2024-05-20 ENCOUNTER — Ambulatory Visit (INDEPENDENT_AMBULATORY_CARE_PROVIDER_SITE_OTHER): Admitting: Podiatry

## 2024-05-20 DIAGNOSIS — M869 Osteomyelitis, unspecified: Secondary | ICD-10-CM | POA: Diagnosis not present

## 2024-05-20 NOTE — Progress Notes (Signed)
 Patient presents for post-op visit today, POV #2 DOS 05/01/2024 RT 2ND AMPUTATION TOE INTERPHALANGEAL  Sometimes I get real nauseated. Some pain but not as bad..  RN Notes: n/a  Vital Signs: Today's Vitals   05/20/24 9160  PainSc: 0-No pain      Radiographs: []  Taken [x]  Not taken  Surgical Site Assessment:  - Dressing:  [x]  Minimal dry blood, intact []  Reinforced   []  Changed     -RN Notes: n/a  - Incision:  [x]  CDI (clean, dry, intact)  []  Mild erythema  []  Drainage noted   -RN Notes: n/a  - Swelling:  []  None  [x]  Mild  []  Moderate   []  Significant     -RN Notes: n/a  - Bruising:  [x]  None  []  Present: n/a   - Sutures/Staples:  []  None [x]  Intact  [x]  Removed Today  []  Plan to remove at next visit   -Cast/Splint/Pins: [x]  None []  Intact []  Removed Today []  Plan to remove at next visit []  Replaced  -Signs of infection:  [x]  None  []  Present - Describe: n/a  -DME:    []  None []  AFW [x]  Surgical shoe []  Cast  []  Splint  -Walking status:  [x]  Full WB  []  Partial WB  []  NWB  -Utilizing device:  [x]  None []  Knee Scooter []  Crutches []  Wheelchair    DVT assessment:  [x]  Denies symptoms []  Chest pain/SOB []  Pain in calf/redness/warmth   Redressed DSD and ace wrap. Educated on signs of infection, proper dressing care, pain management, and weight bearing status. Patient will contact provider with any new or worsening symptoms. The provider assessed the patient today and reviewed instructions regarding plan of care.  -  Patient seen and evaluated by myself.  Incisions healing well.  Sutures removed complications.  Steri-Strips applied for reinforcement followed by bandage.  Discussed that she can start to wash the foot with soap and water, dry thoroughly and apply a similar bandage.  Remain in surgical shoe, elevation limited weightbearing.  She is at some nausea but does not report any chest pain or shortness of breath.  The nausea is not new.  Likely resulted from the  antibiotic.  She finishes the antibiotic today.  Will hold off on any further antibiotics as it appears there is no signs of infection at this time and incision healing appropriately.   Donnice JONELLE Fees DPM

## 2024-05-27 ENCOUNTER — Encounter: Admitting: Podiatry

## 2024-05-31 ENCOUNTER — Encounter: Payer: Self-pay | Admitting: Family

## 2024-05-31 NOTE — Progress Notes (Signed)
 This encounter was created in error - please disregard.

## 2024-06-03 ENCOUNTER — Encounter: Admitting: Podiatry

## 2024-06-10 ENCOUNTER — Other Ambulatory Visit: Payer: Self-pay | Admitting: Podiatry

## 2024-06-10 ENCOUNTER — Ambulatory Visit: Admitting: Podiatry

## 2024-06-10 ENCOUNTER — Ambulatory Visit

## 2024-06-10 DIAGNOSIS — M869 Osteomyelitis, unspecified: Secondary | ICD-10-CM | POA: Diagnosis not present

## 2024-06-10 DIAGNOSIS — M2042 Other hammer toe(s) (acquired), left foot: Secondary | ICD-10-CM

## 2024-06-10 DIAGNOSIS — M2041 Other hammer toe(s) (acquired), right foot: Secondary | ICD-10-CM

## 2024-06-10 MED ORDER — MUPIROCIN 2 % EX OINT: 1.0000 | TOPICAL_OINTMENT | Freq: Two times a day (BID) | CUTANEOUS | 2 refills | Status: AC

## 2024-06-10 MED ORDER — DOXYCYCLINE HYCLATE 100 MG PO TABS: 100.0000 mg | ORAL_TABLET | Freq: Two times a day (BID) | ORAL | 0 refills | Status: DC

## 2024-06-10 NOTE — Progress Notes (Signed)
 Subjective: Chief Complaint  Patient presents with   Routine Post Op    POV #3 DOS 05/01/2024 RT 2ND AMPUTATION TOE INTERPHALANGEAL Pt stated that she is doing well she has no concerns     63 year old female presents the office today for follow-up evaluation status post right second toe potation.  She states that she is doing well she is not having any issues with the right foot, except that is dry.  She has had increased swelling to the left third toe.  No open lesions that she reports.  No drainage.  She is, not taking any antibiotics.  Objective: AAO x3, NAD DP/PT pulses palpable bilaterally, CRT less than 3 seconds Status post right second toe amputation.  Dressing present to the incision appears to be healing well with any evidence dehiscence.  There is quite a bit of dry skin present on the right foot.  There is hyperkeratotic lesions on the hallux, distal third toe without any underlying ulceration with drainage or signs of infection. Previous left hallux, second toe amputation.  The distal aspect of the third toe there is edema and some hyperpigmentation present and there is a preulcerative lesion of there is no definitive skin breakdown identified otherwise.  There is no fluctuation or crepitation.  There is no malodor. Hammertoe deformities noted without any further ulcerations at this time. No pain with calf compression, swelling, warmth, erythema  Assessment: 63 year old female status post right second toe amputation; hammertoes; left third toe swelling  Plan: -All treatment options discussed with the patient including all alternatives, risks, complications.  -X-rays obtained and reviewed of the right foot with chronic changes present left third digit.  Cannot fully exclude osteomyelitis. -At this time I show particular hyperkeratotic tissue in the left third toe that any complications or bleeding.  Prescribed mupirocin  ointment applied to this toe as well as amputation's on the  right second toe.  On the left foot offloading.  Discussed with her possible amputation of the toe. - She has quite a dry skin on the right foot.  Debrided the calluses and complications of bleeding.  Discussed miracle foot cream.  Do not apply moisturizer interdigitally. -Monitor for any clinical signs or symptoms of infection and directed to call the office immediately should any occur or go to the ER.  Return for toe ulcer x-ray in 7-10 days.  Donnice JONELLE Fees DPM

## 2024-06-10 NOTE — Patient Instructions (Addendum)
 Wash feet with soap and water daily, dry well.  Apply small amount of the mupirocin  ointment onto the left third as well as the right second toe.  Apply bandage and change daily.  Use moisturizer on the bottom of the feet.  Monitor for any signs/symptoms of infection. Call the office immediately if any occur or go directly to the emergency room. Call with any questions/concerns.

## 2024-06-25 ENCOUNTER — Ambulatory Visit (INDEPENDENT_AMBULATORY_CARE_PROVIDER_SITE_OTHER)

## 2024-06-25 ENCOUNTER — Encounter: Payer: Self-pay | Admitting: Podiatry

## 2024-06-25 ENCOUNTER — Other Ambulatory Visit: Payer: Self-pay | Admitting: Sports Medicine

## 2024-06-25 ENCOUNTER — Ambulatory Visit

## 2024-06-25 ENCOUNTER — Ambulatory Visit (INDEPENDENT_AMBULATORY_CARE_PROVIDER_SITE_OTHER): Admitting: Podiatry

## 2024-06-25 DIAGNOSIS — M2041 Other hammer toe(s) (acquired), right foot: Secondary | ICD-10-CM | POA: Diagnosis not present

## 2024-06-25 DIAGNOSIS — M2042 Other hammer toe(s) (acquired), left foot: Secondary | ICD-10-CM | POA: Diagnosis not present

## 2024-06-25 DIAGNOSIS — M869 Osteomyelitis, unspecified: Secondary | ICD-10-CM

## 2024-06-25 DIAGNOSIS — I25119 Atherosclerotic heart disease of native coronary artery with unspecified angina pectoris: Secondary | ICD-10-CM

## 2024-06-25 MED ORDER — DOXYCYCLINE HYCLATE 100 MG PO TABS: 100.0000 mg | ORAL_TABLET | Freq: Two times a day (BID) | ORAL | 0 refills | Status: AC

## 2024-06-25 NOTE — Progress Notes (Unsigned)
 Subjective: Chief Complaint  Patient presents with   Toe Pain    Right foot 3rd distal toe causing pain. 9 pain. IDDDM A1C 68.65.     63 year old female presents the office today for follow-up evaluation status post right second toe amputation.  States that she has had increasing discomfort at the right third toe.  She states it does not look infected but the toe has been sore.  She denies any open lesions or any areas of drainage.  The amputation site has been healing well.  She also presents today for follow-up evaluation of the left third toe.  She has not been as swollen and she has no tenderness.  No drainage.  Objective: AAO x3, NAD DP/PT pulses palpable bilaterally, CRT less than 3 seconds Status post right second toe amputation.  This is well-healed.  There is hammertoe contracture present to right third toe which has been ongoing.  Hyperkeratotic tissue the distal aspect the toe and upon debridement there is no underlying ulceration, drainage or any signs of infection.  There is no edema, erythema.  She has tenderness diffusely to the toe. On the left third toe there is still localized edema present to the toe and preulcerative area at the distal aspect.  There is no drainage or pus and there is no fluctuation or crepitation.  There is no malodor. Hammertoe deformities noted without any further ulcerations at this time. No pain with calf compression, swelling, warmth, erythema  Assessment: 63 year old female status post right second toe amputation; hammertoes; left third toe swelling  Plan: -All treatment options discussed with the patient including all alternatives, risks, complications.  -X-rays obtained and reviewed of the right foot with chronic changes present left third digit.  Likely chronic osteomyelitis at this time.  Appears to be stable otherwise.  On the right foot status post toe amputation without any evidence of osteomyelitis on the remaining toe.  On the third toe there  is no definitive cortical changes suggest osteomyelitis.  There is significant digital contractures present. -Debrided hyperkeratotic tissue at the distal aspect of right third toe without any complications or bleeding there is no ulcerations or any signs of infection today.  Manage surgical shoe for now but she feels that her surgical shoe was not fitting appropriately so this was changed out to more appropriate size.  This could be causing some of the discomfort.  Discussed that she can start to gradually transition to her shoe as tolerated. -On the left third toe debrided hyperkeratotic tissue.  Swelling appears to be improving discussed likely chronic osteomyelitis.  Unfortunately risk of amputation but since she is stable at this time we will continue to monitor.  Return in about 2 weeks (around 07/09/2024) for x-rays both feet, ulcers .  [name] DPM

## 2024-07-06 LAB — BASIC METABOLIC PANEL WITH GFR
BUN/Creatinine Ratio: 22 (ref 12–28)
BUN: 34 mg/dL — ABNORMAL HIGH (ref 8–27)
CO2: 24 mmol/L (ref 20–29)
Calcium: 9.5 mg/dL (ref 8.7–10.3)
Chloride: 100 mmol/L (ref 96–106)
Creatinine, Ser: 1.52 mg/dL — ABNORMAL HIGH (ref 0.57–1.00)
Glucose: 157 mg/dL — ABNORMAL HIGH (ref 70–99)
Potassium: 4.5 mmol/L (ref 3.5–5.2)
Sodium: 138 mmol/L (ref 134–144)
eGFR: 38 mL/min/1.73 — ABNORMAL LOW

## 2024-07-06 LAB — CBC WITH DIFFERENTIAL/PLATELET
Basophils Absolute: 0 x10E3/uL (ref 0.0–0.2)
Basos: 0 %
EOS (ABSOLUTE): 0.1 x10E3/uL (ref 0.0–0.4)
Eos: 1 %
Hematocrit: 36.3 % (ref 34.0–46.6)
Hemoglobin: 11.3 g/dL (ref 11.1–15.9)
Immature Grans (Abs): 0 x10E3/uL (ref 0.0–0.1)
Immature Granulocytes: 0 %
Lymphocytes Absolute: 3.2 x10E3/uL — ABNORMAL HIGH (ref 0.7–3.1)
Lymphs: 35 %
MCH: 27.8 pg (ref 26.6–33.0)
MCHC: 31.1 g/dL — ABNORMAL LOW (ref 31.5–35.7)
MCV: 89 fL (ref 79–97)
Monocytes Absolute: 0.5 x10E3/uL (ref 0.1–0.9)
Monocytes: 6 %
Neutrophils Absolute: 5.2 x10E3/uL (ref 1.4–7.0)
Neutrophils: 58 %
Platelets: 219 x10E3/uL (ref 150–450)
RBC: 4.07 x10E6/uL (ref 3.77–5.28)
RDW: 14.1 % (ref 11.7–15.4)
WBC: 9 x10E3/uL (ref 3.4–10.8)

## 2024-07-06 LAB — C-REACTIVE PROTEIN: CRP: 9 mg/L (ref 0–10)

## 2024-07-06 LAB — SEDIMENTATION RATE: Sed Rate: 77 mm/h — ABNORMAL HIGH (ref 0–40)

## 2024-07-09 ENCOUNTER — Ambulatory Visit (INDEPENDENT_AMBULATORY_CARE_PROVIDER_SITE_OTHER)

## 2024-07-09 ENCOUNTER — Ambulatory Visit: Admitting: Podiatry

## 2024-07-09 DIAGNOSIS — L97512 Non-pressure chronic ulcer of other part of right foot with fat layer exposed: Secondary | ICD-10-CM | POA: Diagnosis not present

## 2024-07-09 DIAGNOSIS — M86672 Other chronic osteomyelitis, left ankle and foot: Secondary | ICD-10-CM | POA: Diagnosis not present

## 2024-07-09 DIAGNOSIS — L97522 Non-pressure chronic ulcer of other part of left foot with fat layer exposed: Secondary | ICD-10-CM | POA: Diagnosis not present

## 2024-07-09 NOTE — Progress Notes (Signed)
 Subjective: Chief Complaint  Patient presents with   Diabetes Mellitus    IDDM Patient with A1c of 8.4 presents today for a f/u B/L toe osteomyelitis , patient reports neuropathy symptoms have not changed since last medical visit       64 year old female presents the office today for follow-up evaluation status post right second toe amputation.  She states for this standpoint she is doing well but her concern is that her third toes per the left foot is not causing any pain but she was having similar right third toe but that is doing better.  She does not report any drainage pus is.  She does not have any fevers or chills.   Objective: AAO x3, NAD DP/PT pulses palpable bilaterally, CRT less than 3 seconds Status post right second toe amputation.  This is well-healed and there is no erythema, warmth or signs of infection.  On the right third toe distal is a hyperkeratotic lesion without any underlying ulceration, drainage or signs of infection there is no significant edema there is no erythema, warmth.  On the left third toe there is still localized edema present to the toe and preulcerative area at the distal aspect, although clinically doing better today.  There is no drainage or pus and there is no fluctuation or crepitation.  There is no malodor. Hammertoe deformities noted without any further ulcerations at this time. No pain with calf compression, swelling, warmth, erythema  Assessment: 64 year old female status post right second toe amputation; hammertoes; left third toe swelling  Plan: -All treatment options discussed with the patient including all alternatives, risks, complications.  -X-rays obtained and reviewed of the right foot with chronic changes present left third digit.  Likely chronic osteomyelitis at this time.  Appears to be stable otherwise.  On the right foot status post toe amputation without any evidence of osteomyelitis on the remaining toe.  On the third toe there is no  definitive cortical changes suggest osteomyelitis.  There is significant digital contractures present.  No significant changes. -Reviewed the blood work with her.  Sed rate still elevated but has been chronically elevated for the last year, CRP and white blood cell count normal.  Discussed with her cannot completely rule out infection although things more chronic on the left foot.  She is doing well on doxycycline  I refilled this for her today, for 1 more round and then we will likely recheck blood work.  Discussed that she is to monitor closely presents with symptoms of worsening infection report emergency room should any occur but she is still high risk of further amputation which she is well aware of. - Maintain surgical shoe for offloading -Monitor for any clinical signs or symptoms of infection and directed to call the office immediately should any occur or go to the ER.  Return in about 2 weeks (around 07/23/2024) for toe ulcers.  Donnice JONELLE Fees DPM

## 2024-07-10 ENCOUNTER — Ambulatory Visit: Payer: Self-pay | Admitting: Podiatry

## 2024-07-19 ENCOUNTER — Other Ambulatory Visit: Payer: Self-pay | Admitting: Podiatry

## 2024-07-23 ENCOUNTER — Ambulatory Visit: Admitting: Podiatry

## 2024-07-25 ENCOUNTER — Ambulatory Visit (INDEPENDENT_AMBULATORY_CARE_PROVIDER_SITE_OTHER)

## 2024-07-25 ENCOUNTER — Other Ambulatory Visit: Payer: Self-pay | Admitting: Podiatry

## 2024-07-25 ENCOUNTER — Ambulatory Visit: Admitting: Podiatry

## 2024-07-25 DIAGNOSIS — M86672 Other chronic osteomyelitis, left ankle and foot: Secondary | ICD-10-CM

## 2024-07-25 DIAGNOSIS — L84 Corns and callosities: Secondary | ICD-10-CM | POA: Diagnosis not present

## 2024-07-25 NOTE — Patient Instructions (Signed)
 Preparing for Surgery      Thank you for choosing [facility] for your surgical care. Our board-certified and board-qualified physicians bring advanced training and a deep commitment to the highest standards in foot and ankle surgery.   From your first consultation to your final steps in recovery, our team is here to ensure you feel informed, supported, and confident throughout the entire process.   Visit [email or web address] to access our Surgery Patient page, where youll find all of the information listed below, plus additional helpful resources.      The Time of Your Surgery   For hospital procedures, your surgery time will be provided at the time of scheduling. If youre scheduled at [facility], youll receive a call from the surgical center 24 hours before your procedure with your confirmed time.?Please refer to the surgery information provided to you during your surgical consultation to determine where your surgery will take place.  Recommended Devices for Easier Recovery  Devices such as a knee scooter, crutches, walker, or wheelchair may or may not be covered by your insurance. If your surgeon has recommended this after surgery and it is not covered by your insurance you are still responsible for obtaining and using the recommended equipment. If you have questions or concerns about what equipment you will need please contact the office.  To support a smoother recovery, weve gathered a list of helpful, recommended equipment. Visit [email or web address] to see the full list.       Taking Medications?   If you are taking daily heart and blood pressure medications, seizure, reflux, allergy, asthma, anxiety, pain, or diabetes medications, make sure you notify the surgery center/hospital before the day of surgery so they can tell you which medications you should take or avoid the day of surgery.    GLP-1 antagonists taken weekly  should be stopped for 7 days before surgery. GLP-1 antagonists taken daily be stopped on the day of surgery. These include:   Dulaglutide  (Trulicity )   Exenatide extended release (Bydureon BCise)   Exenatide (Byetta)   Semaglutide  (Ozempic ; Wegovy )   Tirzepatide (Mounjaro)   Liraglutide  (Victoza , Saxenda )   Lixisenatide (Adlyxin)   Phentermine (Adipex-P, Lomaira)   Semaglutide  (Rybelsus ) is oral and should be held for 3 days.   Metformin  - should be held for 2 days prior to surgery.   SGLT2 inhibitors should be stopped for 3 days (no longer because of the risk of euglycemic diabetic ketoacidosis) and include:   Canagliflozin (Invokana)   Ertugliflozin (Steglatro)   Dapagliflozin (Farxiga)   Empagliflozin  (Jardiance )   Additional medications to stop:   If you take blood thinners (Warfarin, Eliquis, Xarelto), please consult your doctor about stopping before your procedure.   Aspirin : Consult your doctor about stopping 1 week before your procedure.   Anti-inflammatory medications (such as ibuprofen )        Pre-Operative Instructions   Plan to be at the hospital at least an hour and a half (1.5), or at the surgery center (1) hour before your scheduled time, unless otherwise directed by the surgical center/hospital staff. You must have a responsible adult accompany you, remain during the surgery, and drive you home. Make sure you have directions to the surgical center/hospital to ensure you arrive on time.       [facility] [place]             [address]    [address]  [place], [place] [address]    [place], [place] [address]                 [phone]      [facility]   [place]         [address]                [address]          [place], [place] [address]                            [place], [place] [address]     [facility]         [address]          [place], [place] [address]      If you are having surgery at [facility] or [name], you will need a copy of your medical history and physical form from your family physician within one month before the date of surgery. We will give you a form for your primary physician to complete.       We will make every effort to accommodate the date you request for surgery. However, there are times when surgery dates or times need to be moved. We will contact you as soon as possible if a schedule change is required.     No aspirin /ibuprofen  for one week before surgery. If you are on aspirin , any non-steroidal anti-inflammatory medications (Mobic, Aleve , Ibuprofen ) should not be taken seven (7) days before surgery.       No food or drink after midnight the night before surgery unless directed otherwise by the surgical center/hospital staff. If you are having a surgical procedure in our office, and not in the surgical center or hospital, this does not apply to you.     No alcoholic beverages 24 hours before surgery. No smoking 24 hours before or 24 hours after surgery.      Wear loose pants or shorts. They should be loose enough to fit over bandages, boots, and casts.     Do not wear slip-on shoes. Sneakers are preferred.      If you were given a boot during your surgery consultation appointment, be sure to bring it with you to the surgery center/hospital on the day of your surgery. Also, bring crutches, a knee scooter, or a walker if your physician prescribed it for you before your surgery date.      If you have not been contacted by the surgery center/hospital by the day before your surgery, call the surgery center/hospital to confirm the date and time of your surgery.     Leave time from work may vary depending on the type of surgery you have. Appropriate arrangements should be made before surgery with your employer.     Prescriptions will be electronically submitted  to your pharmacy the evening before your surgery or the day of your surgery. Take the medication as directed. Pain medications will not be refilled on weekends and must be approved by the doctor.      Remove nail polish on the operative foot and avoid getting a pedicure two weeks before surgery.      The night before surgery, wash the foot and leg that is being operated on with water and the antibacterial soap that was provided at your consultation appointment. The antibacterial soap is encased in the  brush. You will need to wet the brush to cleanse the area. Be sure to pay special attention to beneath the toenails and in between the toes. Wash for at least three (3) minutes. Rinse thoroughly with water and pat dry with a towel. Perform this wash unless told not to do so by your physician.    If you have any questions regarding the scheduling of your surgery, please contact our surgical department at [phone]   If you have any questions regarding any of these instructions, please do not hesitate to call our triage nurse at [phone]

## 2024-07-26 ENCOUNTER — Telehealth: Payer: Self-pay | Admitting: Podiatry

## 2024-07-26 NOTE — Telephone Encounter (Signed)
 DOS- 07/31/2024  3RD AMPUTATION TOE MPJ JOINT LT- [id number]  [employer] EFFECTIVE DATE- 07/28/2024  PER [employer] PORTAL, PRIOR AUTH IS NOT REQUIRED FOR CPT CODE 71179. DECISION ID# [id number]

## 2024-07-26 NOTE — Progress Notes (Signed)
 Subjective: Chief Complaint  Patient presents with   Foot Ulcer    Pt is here to f/u on bilateral feet due to ulcers, states no complaints.      64 year old female presents the office today for follow-up evaluation of pain to the right toes and chronic osteomyelitis of the left 3rd toe.  She said that she is not having any pain to the toes at this time and she has not seen any drainage or pus.  No fevers or chills.  Objective: AAO x3, NAD DP/PT pulses palpable bilaterally, CRT less than 3 seconds Status post right second toe amputation.  This incision is well-healed.  There is no edema no erythema On the right third toe hyperkeratotic lesion distally without any underlying ulceration, drainage or signs of infection. On the left third toes hyperkeratotic lesion.  Upon debridement there was purulence to the ulceration which probes close to bone.  There is no fluctuance or crepitation.  Localized edema to the distal portion of toe.  No ascending cellulitis.  There is no malodor. No pain with calf compression, swelling, warmth, erythema  Assessment: 64 year old female chronic osteomyelitis ulceration left third toe/infection; right third preulcerative callus  Plan: -All treatment options discussed with the patient including all alternatives, risks, complications.  -X-rays obtained and reviewed of the right foot with chronic changes present left third digit.  No soft tissue bruising joint. -Sharply debrided hyperkeratotic lesion left third toe without any complication but did reveal underlying abscess, purulence.  This was cultured today.  Given the infection the toe as well as likely chronic osteomyelitis discussed with her at least partial versus total amputation of the toe.  She is in agreement to proceed.  Will plan on doing this next week. -The incision placement as well as the postoperative course was discussed with the patient. I discussed risks of the surgery which include, but not limited  to, infection, bleeding, pain, swelling, need for further surgery, delayed or nonhealing, painful or ugly scar, numbness or sensation changes, recurrence, transfer lesions, further deformity, DVT/PE, loss of toe/foot. Patient understands these risks and wishes to proceed with surgery. The surgical consent was reviewed with the patient all 3 pages were signed. No promises or guarantees were given to the outcome of the procedure. All questions were answered to the best of my ability. Before the surgery the patient was encouraged to call the office if there is any further questions. The surgery will be performed at the [facility] on an outpatient basis. -Sharply debrided the callus on the right third toe indication of bleeding.  Monitoring signs or symptoms of infection.  No follow-ups on file.  Donnice JONELLE Fees DPM

## 2024-07-29 LAB — CULTURE,AEROBIC BACTERIA WITH GRAM STAIN
MICRO NUMBER:: 17526904
RESULT:: NO GROWTH
SPECIMEN QUALITY:: ADEQUATE

## 2024-07-29 LAB — HOUSE ACCOUNT TRACKING

## 2024-07-30 ENCOUNTER — Telehealth: Payer: Self-pay | Admitting: Podiatry

## 2024-07-30 ENCOUNTER — Ambulatory Visit: Payer: Self-pay | Admitting: Podiatry

## 2024-07-30 NOTE — Telephone Encounter (Signed)
 Called patient as I received call from [place] at [facility]. Per patient she is unable to keep tomorrows scheduled surgery so she has been moved to the following Wednesday 08/07/2024.

## 2024-08-05 ENCOUNTER — Encounter: Admitting: Podiatry

## 2024-08-19 ENCOUNTER — Encounter: Admitting: Podiatry

## 2024-09-02 ENCOUNTER — Encounter: Admitting: Podiatry

## 2024-09-05 ENCOUNTER — Ambulatory Visit: Admitting: Internal Medicine
